# Patient Record
Sex: Male | Born: 1940
Health system: Southern US, Community
[De-identification: ages and names within clinical notes are randomized; demographics above are authoritative.]

## PROBLEM LIST (undated history)

## (undated) DIAGNOSIS — Z9889 Other specified postprocedural states: Secondary | ICD-10-CM

## (undated) DIAGNOSIS — D72829 Elevated white blood cell count, unspecified: Secondary | ICD-10-CM

## (undated) DIAGNOSIS — Z974 Presence of external hearing-aid: Secondary | ICD-10-CM

## (undated) DIAGNOSIS — C439 Malignant melanoma of skin, unspecified: Secondary | ICD-10-CM

## (undated) DIAGNOSIS — G4733 Obstructive sleep apnea (adult) (pediatric): Secondary | ICD-10-CM

## (undated) DIAGNOSIS — I10 Essential (primary) hypertension: Secondary | ICD-10-CM

## (undated) DIAGNOSIS — R7303 Prediabetes: Secondary | ICD-10-CM

## (undated) DIAGNOSIS — K219 Gastro-esophageal reflux disease without esophagitis: Secondary | ICD-10-CM

## (undated) DIAGNOSIS — K5190 Ulcerative colitis, unspecified, without complications: Secondary | ICD-10-CM

## (undated) DIAGNOSIS — E213 Hyperparathyroidism, unspecified: Secondary | ICD-10-CM

## (undated) DIAGNOSIS — Z8701 Personal history of pneumonia (recurrent): Secondary | ICD-10-CM

## (undated) DIAGNOSIS — K6389 Other specified diseases of intestine: Secondary | ICD-10-CM

## (undated) DIAGNOSIS — Z87440 Personal history of urinary (tract) infections: Secondary | ICD-10-CM

## (undated) DIAGNOSIS — J45909 Unspecified asthma, uncomplicated: Secondary | ICD-10-CM

## (undated) DIAGNOSIS — B370 Candidal stomatitis: Secondary | ICD-10-CM

## (undated) DIAGNOSIS — I509 Heart failure, unspecified: Secondary | ICD-10-CM

## (undated) DIAGNOSIS — M459 Ankylosing spondylitis of unspecified sites in spine: Secondary | ICD-10-CM

## (undated) DIAGNOSIS — N200 Calculus of kidney: Secondary | ICD-10-CM

## (undated) DIAGNOSIS — N189 Chronic kidney disease, unspecified: Secondary | ICD-10-CM

## (undated) DIAGNOSIS — A0472 Enterocolitis due to Clostridium difficile, not specified as recurrent: Secondary | ICD-10-CM

## (undated) DIAGNOSIS — K5790 Diverticulosis of intestine, part unspecified, without perforation or abscess without bleeding: Secondary | ICD-10-CM

## (undated) DIAGNOSIS — Z872 Personal history of diseases of the skin and subcutaneous tissue: Secondary | ICD-10-CM

## (undated) DIAGNOSIS — M8580 Other specified disorders of bone density and structure, unspecified site: Secondary | ICD-10-CM

## (undated) DIAGNOSIS — E559 Vitamin D deficiency, unspecified: Secondary | ICD-10-CM

## (undated) DIAGNOSIS — N400 Enlarged prostate without lower urinary tract symptoms: Secondary | ICD-10-CM

## (undated) DIAGNOSIS — Z87442 Personal history of urinary calculi: Secondary | ICD-10-CM

## (undated) DIAGNOSIS — I2510 Atherosclerotic heart disease of native coronary artery without angina pectoris: Secondary | ICD-10-CM

## (undated) DIAGNOSIS — Z8719 Personal history of other diseases of the digestive system: Secondary | ICD-10-CM

## (undated) DIAGNOSIS — E785 Hyperlipidemia, unspecified: Secondary | ICD-10-CM

## (undated) DIAGNOSIS — F329 Major depressive disorder, single episode, unspecified: Secondary | ICD-10-CM

## (undated) DIAGNOSIS — M1990 Unspecified osteoarthritis, unspecified site: Secondary | ICD-10-CM

## (undated) DIAGNOSIS — K529 Noninfective gastroenteritis and colitis, unspecified: Secondary | ICD-10-CM

## (undated) DIAGNOSIS — K449 Diaphragmatic hernia without obstruction or gangrene: Secondary | ICD-10-CM

## (undated) DIAGNOSIS — F32A Depression, unspecified: Secondary | ICD-10-CM

## (undated) DIAGNOSIS — D509 Iron deficiency anemia, unspecified: Secondary | ICD-10-CM

## (undated) DIAGNOSIS — N529 Male erectile dysfunction, unspecified: Secondary | ICD-10-CM

## (undated) DIAGNOSIS — J9611 Chronic respiratory failure with hypoxia: Secondary | ICD-10-CM

## (undated) DIAGNOSIS — J449 Chronic obstructive pulmonary disease, unspecified: Secondary | ICD-10-CM

## (undated) HISTORY — DX: Benign prostatic hyperplasia without lower urinary tract symptoms: N40.0

## (undated) HISTORY — PX: CATARACT EXTRACTION, BILATERAL: SHX1313

## (undated) HISTORY — PX: SKIN CANCER EXCISION: SHX779

## (undated) HISTORY — DX: Enterocolitis due to Clostridium difficile, not specified as recurrent: A04.72

## (undated) HISTORY — DX: Gastro-esophageal reflux disease without esophagitis: K21.9

## (undated) HISTORY — PX: FOOT SURGERY: SHX648

## (undated) HISTORY — DX: Obstructive sleep apnea (adult) (pediatric): G47.33

## (undated) HISTORY — DX: Morbid (severe) obesity due to excess calories: E66.01

## (undated) HISTORY — PX: SHOULDER SURGERY: SHX246

## (undated) HISTORY — PX: TONSILLECTOMY: SUR1361

## (undated) HISTORY — DX: Unspecified osteoarthritis, unspecified site: M19.90

## (undated) HISTORY — DX: Calculus of kidney: N20.0

## (undated) HISTORY — DX: Diverticulosis of intestine, part unspecified, without perforation or abscess without bleeding: K57.90

## (undated) HISTORY — DX: Hyperlipidemia, unspecified: E78.5

## (undated) HISTORY — DX: Other specified postprocedural states: Z98.890

## (undated) HISTORY — PX: CERVICAL SPINE SURGERY: SHX589

## (undated) HISTORY — DX: Noninfective gastroenteritis and colitis, unspecified: K52.9

## (undated) HISTORY — PX: KNEE SURGERY: SHX244

## (undated) HISTORY — DX: Atherosclerotic heart disease of native coronary artery without angina pectoris: I25.10

## (undated) HISTORY — DX: Other specified diseases of intestine: K63.89

## (undated) HISTORY — DX: Heart failure, unspecified: I50.9

## (undated) HISTORY — PX: CARDIAC CATHETERIZATION: SHX172

## (undated) HISTORY — DX: Chronic obstructive pulmonary disease, unspecified: J44.9

---
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
## 1995-01-06 DIAGNOSIS — I2510 Atherosclerotic heart disease of native coronary artery without angina pectoris: Secondary | ICD-10-CM

## 1995-01-06 HISTORY — DX: Atherosclerotic heart disease of native coronary artery without angina pectoris: I25.10

## 1995-01-06 HISTORY — PX: CORONARY STENT PLACEMENT: SHX1402

## 1995-02-06 DIAGNOSIS — IMO0001 Reserved for inherently not codable concepts without codable children: Secondary | ICD-10-CM

## 1995-02-06 HISTORY — DX: Reserved for inherently not codable concepts without codable children: IMO0001

## 1997-08-16 ENCOUNTER — Ambulatory Visit: Admission: RE | Admit: 1997-08-16 | Discharge: 1997-08-16 | Payer: Self-pay | Admitting: Family Medicine

## 1997-11-21 ENCOUNTER — Ambulatory Visit: Admission: RE | Admit: 1997-11-21 | Discharge: 1997-11-21 | Payer: Self-pay | Admitting: Family Medicine

## 2000-04-25 ENCOUNTER — Encounter: Payer: Self-pay | Admitting: Specialist

## 2000-05-02 ENCOUNTER — Observation Stay (HOSPITAL_COMMUNITY): Admission: RE | Admit: 2000-05-02 | Discharge: 2000-05-03 | Payer: Self-pay | Admitting: Specialist

## 2000-11-06 ENCOUNTER — Encounter: Payer: Self-pay | Admitting: Specialist

## 2000-11-13 ENCOUNTER — Inpatient Hospital Stay (HOSPITAL_COMMUNITY): Admission: RE | Admit: 2000-11-13 | Discharge: 2000-11-18 | Payer: Self-pay | Admitting: Specialist

## 2000-11-13 ENCOUNTER — Encounter: Payer: Self-pay | Admitting: Specialist

## 2000-11-15 ENCOUNTER — Encounter: Payer: Self-pay | Admitting: Specialist

## 2000-12-28 ENCOUNTER — Emergency Department (HOSPITAL_COMMUNITY): Admission: EM | Admit: 2000-12-28 | Discharge: 2000-12-28 | Payer: Self-pay | Admitting: Emergency Medicine

## 2000-12-31 ENCOUNTER — Ambulatory Visit (HOSPITAL_COMMUNITY): Admission: RE | Admit: 2000-12-31 | Discharge: 2000-12-31 | Payer: Self-pay | Admitting: Family Medicine

## 2000-12-31 ENCOUNTER — Encounter: Payer: Self-pay | Admitting: Family Medicine

## 2002-07-28 ENCOUNTER — Encounter: Payer: Self-pay | Admitting: Emergency Medicine

## 2002-07-28 ENCOUNTER — Emergency Department (HOSPITAL_COMMUNITY): Admission: EM | Admit: 2002-07-28 | Discharge: 2002-07-28 | Payer: Self-pay | Admitting: Emergency Medicine

## 2002-08-03 ENCOUNTER — Emergency Department (HOSPITAL_COMMUNITY): Admission: EM | Admit: 2002-08-03 | Discharge: 2002-08-03 | Payer: Self-pay | Admitting: Emergency Medicine

## 2003-05-27 ENCOUNTER — Other Ambulatory Visit: Admission: RE | Admit: 2003-05-27 | Discharge: 2003-05-27 | Payer: Self-pay | Admitting: Dermatology

## 2004-09-18 ENCOUNTER — Ambulatory Visit (HOSPITAL_COMMUNITY): Admission: RE | Admit: 2004-09-18 | Discharge: 2004-09-18 | Payer: Self-pay | Admitting: Family Medicine

## 2004-11-30 ENCOUNTER — Ambulatory Visit: Payer: Self-pay | Admitting: Orthopedic Surgery

## 2004-12-14 ENCOUNTER — Ambulatory Visit: Payer: Self-pay | Admitting: Orthopedic Surgery

## 2005-03-29 ENCOUNTER — Inpatient Hospital Stay (HOSPITAL_COMMUNITY): Admission: EM | Admit: 2005-03-29 | Discharge: 2005-03-30 | Payer: Self-pay | Admitting: Emergency Medicine

## 2005-04-05 DIAGNOSIS — A0472 Enterocolitis due to Clostridium difficile, not specified as recurrent: Secondary | ICD-10-CM

## 2005-04-05 HISTORY — PX: COLONOSCOPY: SHX174

## 2005-04-05 HISTORY — DX: Enterocolitis due to Clostridium difficile, not specified as recurrent: A04.72

## 2005-05-02 ENCOUNTER — Encounter (HOSPITAL_COMMUNITY): Admission: RE | Admit: 2005-05-02 | Discharge: 2005-06-01 | Payer: Self-pay | Admitting: *Deleted

## 2005-05-03 ENCOUNTER — Ambulatory Visit: Payer: Self-pay | Admitting: Orthopedic Surgery

## 2005-05-15 ENCOUNTER — Ambulatory Visit: Payer: Self-pay | Admitting: Internal Medicine

## 2005-05-23 ENCOUNTER — Encounter (INDEPENDENT_AMBULATORY_CARE_PROVIDER_SITE_OTHER): Payer: Self-pay | Admitting: Specialist

## 2005-05-23 ENCOUNTER — Ambulatory Visit: Payer: Self-pay | Admitting: Internal Medicine

## 2005-05-23 ENCOUNTER — Ambulatory Visit (HOSPITAL_COMMUNITY): Admission: RE | Admit: 2005-05-23 | Discharge: 2005-05-23 | Payer: Self-pay | Admitting: Internal Medicine

## 2005-06-12 ENCOUNTER — Ambulatory Visit: Payer: Self-pay | Admitting: Internal Medicine

## 2005-06-27 ENCOUNTER — Ambulatory Visit: Payer: Self-pay | Admitting: Internal Medicine

## 2005-07-03 ENCOUNTER — Ambulatory Visit: Payer: Self-pay | Admitting: Internal Medicine

## 2006-05-15 ENCOUNTER — Inpatient Hospital Stay (HOSPITAL_COMMUNITY): Admission: RE | Admit: 2006-05-15 | Discharge: 2006-05-20 | Payer: Self-pay | Admitting: Specialist

## 2008-05-14 ENCOUNTER — Encounter: Payer: Self-pay | Admitting: Internal Medicine

## 2008-05-26 ENCOUNTER — Ambulatory Visit (HOSPITAL_COMMUNITY): Admission: RE | Admit: 2008-05-26 | Discharge: 2008-05-26 | Payer: Self-pay | Admitting: Family Medicine

## 2008-08-01 ENCOUNTER — Ambulatory Visit: Admission: RE | Admit: 2008-08-01 | Discharge: 2008-08-01 | Payer: Self-pay | Admitting: Emergency Medicine

## 2008-11-11 ENCOUNTER — Emergency Department (HOSPITAL_COMMUNITY): Admission: EM | Admit: 2008-11-11 | Discharge: 2008-11-11 | Payer: Self-pay | Admitting: Emergency Medicine

## 2008-11-26 ENCOUNTER — Ambulatory Visit (HOSPITAL_COMMUNITY): Admission: RE | Admit: 2008-11-26 | Discharge: 2008-11-26 | Payer: Self-pay | Admitting: Family Medicine

## 2009-02-05 DIAGNOSIS — K529 Noninfective gastroenteritis and colitis, unspecified: Secondary | ICD-10-CM

## 2009-02-05 HISTORY — DX: Noninfective gastroenteritis and colitis, unspecified: K52.9

## 2009-03-22 ENCOUNTER — Inpatient Hospital Stay (HOSPITAL_COMMUNITY): Admission: EM | Admit: 2009-03-22 | Discharge: 2009-03-25 | Payer: Self-pay | Admitting: Emergency Medicine

## 2009-03-23 ENCOUNTER — Encounter (INDEPENDENT_AMBULATORY_CARE_PROVIDER_SITE_OTHER): Payer: Self-pay | Admitting: Internal Medicine

## 2009-03-23 HISTORY — PX: TRANSTHORACIC ECHOCARDIOGRAM: SHX275

## 2009-04-22 ENCOUNTER — Encounter: Admission: RE | Admit: 2009-04-22 | Discharge: 2009-04-22 | Payer: Self-pay | Admitting: Neurosurgery

## 2009-05-26 ENCOUNTER — Ambulatory Visit (HOSPITAL_COMMUNITY): Admission: RE | Admit: 2009-05-26 | Discharge: 2009-05-26 | Payer: Self-pay | Admitting: Family Medicine

## 2009-06-07 ENCOUNTER — Ambulatory Visit (HOSPITAL_COMMUNITY): Admission: RE | Admit: 2009-06-07 | Discharge: 2009-06-07 | Payer: Self-pay | Admitting: Neurosurgery

## 2009-07-21 HISTORY — PX: CARDIOVASCULAR STRESS TEST: SHX262

## 2009-08-05 ENCOUNTER — Emergency Department (HOSPITAL_COMMUNITY): Admission: EM | Admit: 2009-08-05 | Discharge: 2009-08-06 | Payer: Self-pay | Admitting: Emergency Medicine

## 2009-08-19 ENCOUNTER — Inpatient Hospital Stay (HOSPITAL_COMMUNITY): Admission: RE | Admit: 2009-08-19 | Discharge: 2009-08-21 | Payer: Self-pay | Admitting: Neurosurgery

## 2009-10-19 ENCOUNTER — Emergency Department (HOSPITAL_COMMUNITY): Admission: EM | Admit: 2009-10-19 | Discharge: 2009-10-19 | Payer: Self-pay | Admitting: Emergency Medicine

## 2009-11-21 ENCOUNTER — Ambulatory Visit (HOSPITAL_COMMUNITY): Admission: RE | Admit: 2009-11-21 | Discharge: 2009-11-21 | Payer: Self-pay | Admitting: Family Medicine

## 2010-03-07 NOTE — Letter (Signed)
Summary: REQUEST LETTER/DDS  REQUEST LETTER/DDS   Imported By: Zeb Comfort 05/14/2008 14:08:20  _____________________________________________________________________  External Attachment:    Type:   Image     Comment:   External Document

## 2010-04-11 ENCOUNTER — Encounter (HOSPITAL_COMMUNITY): Payer: Self-pay

## 2010-04-11 ENCOUNTER — Other Ambulatory Visit (HOSPITAL_COMMUNITY): Payer: Self-pay | Admitting: Family Medicine

## 2010-04-11 ENCOUNTER — Ambulatory Visit (HOSPITAL_COMMUNITY)
Admission: RE | Admit: 2010-04-11 | Discharge: 2010-04-11 | Disposition: A | Discharge status: Home / Self Care | Payer: Medicare Other | Source: Ambulatory Visit | Attending: Family Medicine | Admitting: Family Medicine

## 2010-04-11 DIAGNOSIS — R0602 Shortness of breath: Secondary | ICD-10-CM

## 2010-04-11 DIAGNOSIS — R079 Chest pain, unspecified: Secondary | ICD-10-CM | POA: Insufficient documentation

## 2010-04-11 DIAGNOSIS — R1012 Left upper quadrant pain: Secondary | ICD-10-CM | POA: Insufficient documentation

## 2010-04-11 HISTORY — DX: Essential (primary) hypertension: I10

## 2010-04-12 ENCOUNTER — Other Ambulatory Visit (HOSPITAL_COMMUNITY): Payer: Self-pay | Admitting: Family Medicine

## 2010-04-12 ENCOUNTER — Encounter (HOSPITAL_COMMUNITY): Payer: Self-pay

## 2010-04-12 ENCOUNTER — Ambulatory Visit (HOSPITAL_COMMUNITY)
Admission: RE | Admit: 2010-04-12 | Discharge: 2010-04-12 | Disposition: A | Discharge status: Home / Self Care | Payer: Medicare Other | Source: Ambulatory Visit | Attending: Family Medicine | Admitting: Family Medicine

## 2010-04-12 ENCOUNTER — Inpatient Hospital Stay (HOSPITAL_COMMUNITY)
Admission: RE | Admit: 2010-04-12 | Discharge: 2010-04-16 | DRG: 192 | Disposition: A | Discharge status: Home / Self Care | Payer: Medicare Other | Source: Ambulatory Visit | Attending: Internal Medicine | Admitting: Internal Medicine

## 2010-04-12 DIAGNOSIS — G4733 Obstructive sleep apnea (adult) (pediatric): Secondary | ICD-10-CM | POA: Diagnosis present

## 2010-04-12 DIAGNOSIS — R7989 Other specified abnormal findings of blood chemistry: Secondary | ICD-10-CM | POA: Insufficient documentation

## 2010-04-12 DIAGNOSIS — I10 Essential (primary) hypertension: Secondary | ICD-10-CM | POA: Insufficient documentation

## 2010-04-12 DIAGNOSIS — J449 Chronic obstructive pulmonary disease, unspecified: Principal | ICD-10-CM | POA: Diagnosis present

## 2010-04-12 DIAGNOSIS — R0602 Shortness of breath: Secondary | ICD-10-CM

## 2010-04-12 DIAGNOSIS — F17200 Nicotine dependence, unspecified, uncomplicated: Secondary | ICD-10-CM | POA: Insufficient documentation

## 2010-04-12 DIAGNOSIS — M1990 Unspecified osteoarthritis, unspecified site: Secondary | ICD-10-CM | POA: Diagnosis present

## 2010-04-12 DIAGNOSIS — R195 Other fecal abnormalities: Secondary | ICD-10-CM | POA: Diagnosis present

## 2010-04-12 DIAGNOSIS — R0609 Other forms of dyspnea: Secondary | ICD-10-CM | POA: Diagnosis present

## 2010-04-12 DIAGNOSIS — J4489 Other specified chronic obstructive pulmonary disease: Principal | ICD-10-CM | POA: Diagnosis present

## 2010-04-12 DIAGNOSIS — R911 Solitary pulmonary nodule: Secondary | ICD-10-CM | POA: Diagnosis present

## 2010-04-12 DIAGNOSIS — K219 Gastro-esophageal reflux disease without esophagitis: Secondary | ICD-10-CM | POA: Diagnosis present

## 2010-04-12 DIAGNOSIS — E876 Hypokalemia: Secondary | ICD-10-CM | POA: Diagnosis not present

## 2010-04-12 DIAGNOSIS — N400 Enlarged prostate without lower urinary tract symptoms: Secondary | ICD-10-CM | POA: Diagnosis present

## 2010-04-12 DIAGNOSIS — R0989 Other specified symptoms and signs involving the circulatory and respiratory systems: Secondary | ICD-10-CM | POA: Diagnosis present

## 2010-04-12 DIAGNOSIS — E785 Hyperlipidemia, unspecified: Secondary | ICD-10-CM | POA: Diagnosis present

## 2010-04-12 DIAGNOSIS — K259 Gastric ulcer, unspecified as acute or chronic, without hemorrhage or perforation: Secondary | ICD-10-CM | POA: Diagnosis present

## 2010-04-12 DIAGNOSIS — Z791 Long term (current) use of non-steroidal anti-inflammatories (NSAID): Secondary | ICD-10-CM

## 2010-04-12 LAB — COMPREHENSIVE METABOLIC PANEL
ALT: 20 U/L (ref 0–53)
AST: 19 U/L (ref 0–37)
Albumin: 3.5 g/dL (ref 3.5–5.2)
Alkaline Phosphatase: 78 U/L (ref 39–117)
BUN: 12 mg/dL (ref 6–23)
CO2: 24 mEq/L (ref 19–32)
Calcium: 8.9 mg/dL (ref 8.4–10.5)
Chloride: 106 mEq/L (ref 96–112)
Creatinine, Ser: 1.03 mg/dL (ref 0.4–1.5)
GFR calc Af Amer: >60 mL/min (ref >60)
GFR calc non Af Amer: >60 mL/min (ref >60)
Glucose, Bld: 95 mg/dL (ref 70–99)
Potassium: 3.8 mEq/L (ref 3.5–5.1)
Sodium: 141 mEq/L (ref 135–145)
Total Bilirubin: 1.1 mg/dL (ref 0.3–1.2)
Total Protein: 6.5 g/dL (ref 6.0–8.3)

## 2010-04-12 LAB — D-DIMER, QUANTITATIVE (NOT AT ARMC): D-Dimer, Quant: 1.93 ug/mL-FEU — ABNORMAL HIGH (ref 0.00–0.48)

## 2010-04-12 LAB — CBC
HCT: 43.9 %
HCT: 42.5 % (ref 39.0–52.0)
Hemoglobin: 14.4 g/dL
Hemoglobin: 14.4 g/dL (ref 13.0–17.0)
MCH: 29.6 pg
MCH: 29.9 pg (ref 26.0–34.0)
MCHC: 32.8 g/dL
MCHC: 33.9 g/dL (ref 30.0–36.0)
MCV: 90.3 fL
MCV: 88.4 fL (ref 78.0–100.0)
Platelets: 229 10*3/uL
Platelets: 211 10*3/uL (ref 150–400)
RBC: 4.86 MIL/uL
RBC: 4.81 MIL/uL (ref 4.22–5.81)
RDW: 13.2 %
RDW: 13.1 % (ref 11.5–15.5)
WBC: 9.9 10*3/uL
WBC: 10.6 10*3/uL — ABNORMAL HIGH (ref 4.0–10.5)

## 2010-04-12 LAB — BASIC METABOLIC PANEL
BUN: 11 mg/dL
BUN: 12 mg/dL (ref 6–23)
CO2: 23 mEq/L
CO2: 26 mEq/L (ref 19–32)
Calcium: 8.8 mg/dL
Calcium: 8.9 mg/dL (ref 8.4–10.5)
Chloride: 105 mEq/L
Chloride: 104 mEq/L (ref 96–112)
Creatinine, Ser: 1.02 mg/dL
Creatinine, Ser: 1.03 mg/dL (ref 0.4–1.5)
GFR calc Af Amer: >60 mL/min
GFR calc Af Amer: >60 mL/min (ref >60)
GFR calc non Af Amer: >60 mL/min
GFR calc non Af Amer: >60 mL/min (ref >60)
Glucose, Bld: 98 mg/dL
Glucose, Bld: 92 mg/dL (ref 70–99)
Potassium: 3.8 mEq/L
Potassium: 3.5 mEq/L (ref 3.5–5.1)
Sodium: 139 mEq/L
Sodium: 137 mEq/L (ref 135–145)

## 2010-04-12 LAB — DIFFERENTIAL
Basophils Absolute: 0.1 10*3/uL
Basophils Relative: 1 %
Eosinophils Absolute: 0.3 10*3/uL
Eosinophils Relative: 3 %
Lymphocytes Relative: 31 %
Lymphs Abs: 3 10*3/uL
Monocytes Absolute: 1.2 10*3/uL
Monocytes Relative: 12 %
Neutro Abs: 5.3 10*3/uL
Neutrophils Relative %: 54 %

## 2010-04-12 LAB — BILIRUBIN, DIRECT: Bilirubin, Direct: 0.2 mg/dL (ref 0.0–0.3)

## 2010-04-12 MED ORDER — XENON XE 133 GAS
10.0000 | GAS_FOR_INHALATION | Freq: Once | RESPIRATORY_TRACT | Status: AC | PRN
  Administered 2010-04-12: 19.8 via RESPIRATORY_TRACT

## 2010-04-12 MED ORDER — TECHNETIUM TO 99M ALBUMIN AGGREGATED
6.0000 | Freq: Once | INTRAVENOUS | Status: AC | PRN
  Administered 2010-04-12: 5.5 via INTRAVENOUS

## 2010-04-12 MED ORDER — IOHEXOL 350 MG/ML SOLN: 100.0000 mL | Freq: Once | INTRAVENOUS | Status: DC | PRN

## 2010-04-13 ENCOUNTER — Inpatient Hospital Stay (HOSPITAL_COMMUNITY): Payer: Medicare Other

## 2010-04-13 DIAGNOSIS — I517 Cardiomegaly: Secondary | ICD-10-CM

## 2010-04-13 DIAGNOSIS — R109 Unspecified abdominal pain: Secondary | ICD-10-CM

## 2010-04-13 DIAGNOSIS — D649 Anemia, unspecified: Secondary | ICD-10-CM

## 2010-04-13 LAB — TSH: TSH: 1.655 u[IU]/mL (ref 0.350–4.500)

## 2010-04-13 LAB — CARDIAC PANEL(CRET KIN+CKTOT+MB+TROPI)
CK, MB: 0.8 ng/mL (ref 0.3–4.0)
CK, MB: 0.9 ng/mL (ref 0.3–4.0)
Relative Index: 0.8 (ref 0.0–2.5)
Relative Index: INVALID (ref 0.0–2.5)
Total CK: 104 U/L (ref 7–232)
Total CK: 94 U/L (ref 7–232)
Troponin I: 0.01 ng/mL (ref 0.00–0.06)
Troponin I: 0.02 ng/mL (ref 0.00–0.06)

## 2010-04-13 LAB — LIPASE, BLOOD: Lipase: 28 U/L (ref 11–59)

## 2010-04-13 LAB — BRAIN NATRIURETIC PEPTIDE: Pro B Natriuretic peptide (BNP): <30 pg/mL (ref 0.0–100.0)

## 2010-04-13 LAB — HEPARIN LEVEL (UNFRACTIONATED): Heparin Unfractionated: 0.62 IU/mL (ref 0.30–0.70)

## 2010-04-13 LAB — HEMOCCULT GUIAC POC 1CARD (OFFICE): Fecal Occult Bld: POSITIVE

## 2010-04-14 ENCOUNTER — Inpatient Hospital Stay (HOSPITAL_COMMUNITY): Payer: Medicare Other

## 2010-04-14 DIAGNOSIS — K259 Gastric ulcer, unspecified as acute or chronic, without hemorrhage or perforation: Secondary | ICD-10-CM

## 2010-04-14 DIAGNOSIS — K921 Melena: Secondary | ICD-10-CM

## 2010-04-14 LAB — BASIC METABOLIC PANEL
BUN: 11 mg/dL (ref 6–23)
CO2: 29 mEq/L (ref 19–32)
Calcium: 8.6 mg/dL (ref 8.4–10.5)
Chloride: 106 mEq/L (ref 96–112)
Creatinine, Ser: 1.11 mg/dL (ref 0.4–1.5)
GFR calc Af Amer: >60 mL/min (ref >60)
GFR calc non Af Amer: >60 mL/min (ref >60)
Glucose, Bld: 99 mg/dL (ref 70–99)
Potassium: 3.5 mEq/L (ref 3.5–5.1)
Sodium: 143 mEq/L (ref 135–145)

## 2010-04-14 LAB — HEMOGLOBIN AND HEMATOCRIT, BLOOD
HCT: 39 % (ref 39.0–52.0)
Hemoglobin: 13.1 g/dL (ref 13.0–17.0)

## 2010-04-14 LAB — CLOSTRIDIUM DIFFICILE BY PCR: Toxigenic C. Difficile by PCR: NEGATIVE

## 2010-04-14 LAB — FECAL LACTOFERRIN, QUANT: Fecal Lactoferrin: POSITIVE

## 2010-04-14 MED ORDER — IOHEXOL 350 MG/ML SOLN
150.0000 mL | Freq: Once | INTRAVENOUS | Status: AC | PRN
  Administered 2010-04-14: 150 mL via INTRAVENOUS

## 2010-04-16 LAB — CBC
HCT: 38.7 % — ABNORMAL LOW (ref 39.0–52.0)
Hemoglobin: 12.9 g/dL — ABNORMAL LOW (ref 13.0–17.0)
MCH: 29.6 pg (ref 26.0–34.0)
MCHC: 33.3 g/dL (ref 30.0–36.0)
MCV: 88.8 fL (ref 78.0–100.0)
Platelets: 219 10*3/uL (ref 150–400)
RBC: 4.36 MIL/uL (ref 4.22–5.81)
RDW: 12.7 % (ref 11.5–15.5)
WBC: 8.4 10*3/uL (ref 4.0–10.5)

## 2010-04-16 LAB — DIFFERENTIAL
Basophils Absolute: 0.1 10*3/uL (ref 0.0–0.1)
Basophils Relative: 1 % (ref 0–1)
Eosinophils Absolute: 0.5 10*3/uL (ref 0.0–0.7)
Eosinophils Relative: 5 % (ref 0–5)
Lymphocytes Relative: 28 % (ref 12–46)
Lymphs Abs: 2.3 10*3/uL (ref 0.7–4.0)
Monocytes Absolute: 1 10*3/uL (ref 0.1–1.0)
Monocytes Relative: 11 % (ref 3–12)
Neutro Abs: 4.6 10*3/uL (ref 1.7–7.7)
Neutrophils Relative %: 55 % (ref 43–77)

## 2010-04-16 LAB — BASIC METABOLIC PANEL
BUN: 13 mg/dL (ref 6–23)
CO2: 28 mEq/L (ref 19–32)
Calcium: 8.2 mg/dL — ABNORMAL LOW (ref 8.4–10.5)
Chloride: 103 mEq/L (ref 96–112)
Creatinine, Ser: 1.11 mg/dL (ref 0.4–1.5)
GFR calc Af Amer: >60 mL/min (ref >60)
GFR calc non Af Amer: >60 mL/min (ref >60)
Glucose, Bld: 104 mg/dL — ABNORMAL HIGH (ref 70–99)
Potassium: 3.2 mEq/L — ABNORMAL LOW (ref 3.5–5.1)
Sodium: 139 mEq/L (ref 135–145)

## 2010-04-17 LAB — HSV(HERPES SIMPLEX VRS) I + II AB-IGG: Herpes Simplex Vrs I + II Ab, IgG: 39.7 IV — ABNORMAL HIGH

## 2010-04-17 LAB — H. PYLORI ANTIBODY, IGG: H Pylori IgG: <0.4 {ISR}

## 2010-04-20 LAB — CBC
HCT: 41.3 % (ref 39.0–52.0)
Hemoglobin: 14 g/dL (ref 13.0–17.0)
MCH: 30.5 pg (ref 26.0–34.0)
MCHC: 34 g/dL (ref 30.0–36.0)
MCV: 89.9 fL (ref 78.0–100.0)
Platelets: 193 10*3/uL (ref 150–400)
RBC: 4.6 MIL/uL (ref 4.22–5.81)
RDW: 13.5 % (ref 11.5–15.5)
WBC: 6.4 10*3/uL (ref 4.0–10.5)

## 2010-04-20 LAB — PROTIME-INR
INR: 1 (ref 0.00–1.49)
Prothrombin Time: 13.4 seconds (ref 11.6–15.2)

## 2010-04-20 LAB — DIFFERENTIAL
Basophils Absolute: 0 10*3/uL (ref 0.0–0.1)
Basophils Relative: 1 % (ref 0–1)
Eosinophils Absolute: 0.2 10*3/uL (ref 0.0–0.7)
Eosinophils Relative: 3 % (ref 0–5)
Lymphocytes Relative: 25 % (ref 12–46)
Lymphs Abs: 1.6 10*3/uL (ref 0.7–4.0)
Monocytes Absolute: 0.4 10*3/uL (ref 0.1–1.0)
Monocytes Relative: 6 % (ref 3–12)
Neutro Abs: 4.2 10*3/uL (ref 1.7–7.7)
Neutrophils Relative %: 65 % (ref 43–77)

## 2010-04-23 LAB — URINALYSIS, ROUTINE W REFLEX MICROSCOPIC
Bilirubin Urine: NEGATIVE
Bilirubin Urine: NEGATIVE
Glucose, UA: NEGATIVE mg/dL
Glucose, UA: NEGATIVE mg/dL
Hgb urine dipstick: NEGATIVE
Hgb urine dipstick: NEGATIVE
Ketones, ur: NEGATIVE mg/dL
Ketones, ur: NEGATIVE mg/dL
Nitrite: NEGATIVE
Nitrite: NEGATIVE
Protein, ur: NEGATIVE mg/dL
Protein, ur: NEGATIVE mg/dL
Specific Gravity, Urine: 1.018 (ref 1.005–1.030)
Specific Gravity, Urine: 1.019 (ref 1.005–1.030)
Urobilinogen, UA: 0.2 mg/dL (ref 0.0–1.0)
Urobilinogen, UA: 0.2 mg/dL (ref 0.0–1.0)
pH: 5 (ref 5.0–8.0)
pH: 5.5 (ref 5.0–8.0)

## 2010-04-23 LAB — DIFFERENTIAL
Basophils Absolute: 0.1 10*3/uL (ref 0.0–0.1)
Basophils Absolute: 0.1 10*3/uL (ref 0.0–0.1)
Basophils Relative: 1 % (ref 0–1)
Basophils Relative: 1 % (ref 0–1)
Eosinophils Absolute: 0.5 10*3/uL (ref 0.0–0.7)
Eosinophils Absolute: 0.4 10*3/uL (ref 0.0–0.7)
Eosinophils Relative: 6 % — ABNORMAL HIGH (ref 0–5)
Eosinophils Relative: 5 % (ref 0–5)
Lymphocytes Relative: 41 % (ref 12–46)
Lymphocytes Relative: 40 % (ref 12–46)
Lymphs Abs: 3.2 10*3/uL (ref 0.7–4.0)
Lymphs Abs: 3.4 10*3/uL (ref 0.7–4.0)
Monocytes Absolute: 0.7 10*3/uL (ref 0.1–1.0)
Monocytes Absolute: 0.7 10*3/uL (ref 0.1–1.0)
Monocytes Relative: 9 % (ref 3–12)
Monocytes Relative: 8 % (ref 3–12)
Neutro Abs: 3.4 10*3/uL (ref 1.7–7.7)
Neutro Abs: 4 10*3/uL (ref 1.7–7.7)
Neutrophils Relative %: 43 % (ref 43–77)
Neutrophils Relative %: 47 % (ref 43–77)

## 2010-04-23 LAB — CBC
HCT: 43.6 % (ref 39.0–52.0)
HCT: 45.6 % (ref 39.0–52.0)
Hemoglobin: 15.2 g/dL (ref 13.0–17.0)
Hemoglobin: 15.4 g/dL (ref 13.0–17.0)
MCH: 31.1 pg (ref 26.0–34.0)
MCH: 30.6 pg (ref 26.0–34.0)
MCHC: 35 g/dL (ref 30.0–36.0)
MCHC: 33.8 g/dL (ref 30.0–36.0)
MCV: 89 fL (ref 78.0–100.0)
MCV: 90.5 fL (ref 78.0–100.0)
Platelets: 215 10*3/uL (ref 150–400)
Platelets: 194 10*3/uL (ref 150–400)
RBC: 4.89 MIL/uL (ref 4.22–5.81)
RBC: 5.04 MIL/uL (ref 4.22–5.81)
RDW: 13.3 % (ref 11.5–15.5)
RDW: 13.3 % (ref 11.5–15.5)
WBC: 7.8 10*3/uL (ref 4.0–10.5)
WBC: 8.6 10*3/uL (ref 4.0–10.5)

## 2010-04-23 LAB — COMPREHENSIVE METABOLIC PANEL
ALT: 22 U/L (ref 0–53)
ALT: 23 U/L (ref 0–53)
AST: 25 U/L (ref 0–37)
AST: 24 U/L (ref 0–37)
Albumin: 3.7 g/dL (ref 3.5–5.2)
Albumin: 3.8 g/dL (ref 3.5–5.2)
Alkaline Phosphatase: 92 U/L (ref 39–117)
Alkaline Phosphatase: 89 U/L (ref 39–117)
BUN: 16 mg/dL (ref 6–23)
BUN: 9 mg/dL (ref 6–23)
CO2: 26 mEq/L (ref 19–32)
CO2: 28 mEq/L (ref 19–32)
Calcium: 9 mg/dL (ref 8.4–10.5)
Calcium: 9 mg/dL (ref 8.4–10.5)
Chloride: 109 mEq/L (ref 96–112)
Chloride: 108 mEq/L (ref 96–112)
Creatinine, Ser: 1.13 mg/dL (ref 0.4–1.5)
Creatinine, Ser: 0.91 mg/dL (ref 0.4–1.5)
GFR calc Af Amer: >60 mL/min (ref >60)
GFR calc Af Amer: >60 mL/min (ref >60)
GFR calc non Af Amer: >60 mL/min (ref >60)
GFR calc non Af Amer: >60 mL/min (ref >60)
Glucose, Bld: 112 mg/dL — ABNORMAL HIGH (ref 70–99)
Glucose, Bld: 98 mg/dL (ref 70–99)
Potassium: 3.9 mEq/L (ref 3.5–5.1)
Potassium: 4.3 mEq/L (ref 3.5–5.1)
Sodium: 142 mEq/L (ref 135–145)
Sodium: 141 mEq/L (ref 135–145)
Total Bilirubin: 0.6 mg/dL (ref 0.3–1.2)
Total Bilirubin: 0.8 mg/dL (ref 0.3–1.2)
Total Protein: 6.9 g/dL (ref 6.0–8.3)
Total Protein: 6.7 g/dL (ref 6.0–8.3)

## 2010-04-23 LAB — SURGICAL PCR SCREEN
MRSA, PCR: NEGATIVE
Staphylococcus aureus: NEGATIVE

## 2010-04-23 LAB — PROTIME-INR
INR: 1.02 (ref 0.00–1.49)
Prothrombin Time: 13.3 seconds (ref 11.6–15.2)

## 2010-04-23 LAB — LIPASE, BLOOD: Lipase: 32 U/L (ref 11–59)

## 2010-04-23 LAB — APTT: aPTT: 32 seconds (ref 24–37)

## 2010-04-23 NOTE — Op Note (Signed)
  NAME:  [name], [name]             ACCOUNT NO.:  [id number]  MEDICAL RECORD NO.:  [id number]           PATIENT TYPE:  LOCATION:                                 FACILITY:  PHYSICIAN:  R. Garfield Cornea, M.D. DATE OF BIRTH:  [birth year]  DATE OF PROCEDURE:  04/14/2010 DATE OF DISCHARGE:                              OPERATIVE REPORT   PROCEDURE:  Diagnostic EGD.  INDICATIONS FOR PROCEDURE:  A 70 year old gentleman with low-volume hematochezia.  He remains hemodynamically stable with a normal hemoglobin and also has significant dyspepsia and epigastric pain in the setting of excessive daily NSAID use.  He is admitted to the hospital with dyspnea, workup for pulmonary emboli inconclusive at this time. EGD is now being done to sort out his symptoms.  Risks, benefits, limitations, alternatives, and imponderables have been discussed and questions answered.  Please see the documentation in the medical record.  PROCEDURE NOTE:  O2 saturation, blood pressure, pulse, and respiration monitored throughout the entirety of the procedure.  CONSCIOUS SEDATION:  Versed 3 mg IV, Demerol 75 mg IV in divided doses.  INSTRUMENT:  Pentax video chip system.  Cetacaine spray for topical pharyngeal anesthesia.  FINDINGS:  Examination of tubular esophagus revealed no mucosal abnormalities.  EG junction was easily traversed.  Stomach:  Gastric cavity was emptied and insufflated well with air.  Thorough examination of the gastric mucosa including retroflexion at the proximal stomach and esophagogastric junction demonstrated three 5-mm antral ulcers with clean bases.  There was some underlying nodularity in the mucosa, but these appeared to be benign.  Please see photos.  There was no obvious infiltrating process or other abnormality.  Pylorus was patent and easily traversed.  Examination of the bulb and second portion revealed no abnormalities.  THERAPEUTIC/DIAGNOSTIC MANEUVERS PERFORMED:  None.  The  patient tolerated the procedure well, was reactive to endoscopy.  IMPRESSION: 1. Normal EGD. 2. Normal esophagus. 3. Gastric ulcers as described above, otherwise normal stomach, patent     pylorus, normal D1-D2.  RECOMMENDATIONS: 1. Increase Protonix to 40 mg orally twice daily. 2. Stop NSAID use entirely for the time-being. 3. Obtain H. pylori serologies, treat if positive. 4. Advocate a repeat EGD and probable colonoscopy in 12 weeks from     now.  He appears to have trivial rectal bleeding.  His hemoglobin remains normal.  There was a history of left-sided proctocolitis on prior colonoscopy, it is not clear whether or not he has underlying inflammatory bowel disease or this was related to infectious process of previously at any rate, nonsteroidal agent use in access could produce rectal bleeding via mechanism of colitis or could exacerbate a preexisting proctocolitis of other cause.     Bridgette Habermann, M.D.     RMR/MEDQ  D:  04/14/2010  T:  04/14/2010  Job:  [id number]  cc:   Nicki Reaper A. Wolfgang Phoenix, MD Fax: [phone]  [facility]  Electronically Signed by Jannette Spanner M.D. on 04/23/2010 09:11:55 AM

## 2010-04-26 LAB — DIFFERENTIAL
Basophils Absolute: 0 10*3/uL (ref 0.0–0.1)
Basophils Absolute: 0 10*3/uL (ref 0.0–0.1)
Basophils Absolute: 0 10*3/uL (ref 0.0–0.1)
Basophils Absolute: 0 10*3/uL (ref 0.0–0.1)
Basophils Relative: 0 % (ref 0–1)
Basophils Relative: 0 % (ref 0–1)
Basophils Relative: 0 % (ref 0–1)
Basophils Relative: 0 % (ref 0–1)
Eosinophils Absolute: 0 10*3/uL (ref 0.0–0.7)
Eosinophils Absolute: 0 10*3/uL (ref 0.0–0.7)
Eosinophils Absolute: 0 10*3/uL (ref 0.0–0.7)
Eosinophils Absolute: 0 10*3/uL (ref 0.0–0.7)
Eosinophils Relative: 0 % (ref 0–5)
Eosinophils Relative: 0 % (ref 0–5)
Eosinophils Relative: 0 % (ref 0–5)
Eosinophils Relative: 0 % (ref 0–5)
Lymphocytes Relative: 10 % — ABNORMAL LOW (ref 12–46)
Lymphocytes Relative: 6 % — ABNORMAL LOW (ref 12–46)
Lymphocytes Relative: 7 % — ABNORMAL LOW (ref 12–46)
Lymphocytes Relative: 8 % — ABNORMAL LOW (ref 12–46)
Lymphs Abs: 1.3 10*3/uL (ref 0.7–4.0)
Lymphs Abs: 1.4 10*3/uL (ref 0.7–4.0)
Lymphs Abs: 1.5 10*3/uL (ref 0.7–4.0)
Lymphs Abs: 1.7 10*3/uL (ref 0.7–4.0)
Monocytes Absolute: 0.5 10*3/uL (ref 0.1–1.0)
Monocytes Absolute: 0.8 10*3/uL (ref 0.1–1.0)
Monocytes Absolute: 0.9 10*3/uL (ref 0.1–1.0)
Monocytes Absolute: 1 10*3/uL (ref 0.1–1.0)
Monocytes Relative: 3 % (ref 3–12)
Monocytes Relative: 4 % (ref 3–12)
Monocytes Relative: 5 % (ref 3–12)
Monocytes Relative: 5 % (ref 3–12)
Neutro Abs: 14.7 10*3/uL — ABNORMAL HIGH (ref 1.7–7.7)
Neutro Abs: 14.8 10*3/uL — ABNORMAL HIGH (ref 1.7–7.7)
Neutro Abs: 17.9 10*3/uL — ABNORMAL HIGH (ref 1.7–7.7)
Neutro Abs: 23.1 10*3/uL — ABNORMAL HIGH (ref 1.7–7.7)
Neutrophils Relative %: 86 % — ABNORMAL HIGH (ref 43–77)
Neutrophils Relative %: 88 % — ABNORMAL HIGH (ref 43–77)
Neutrophils Relative %: 89 % — ABNORMAL HIGH (ref 43–77)
Neutrophils Relative %: 91 % — ABNORMAL HIGH (ref 43–77)
WBC Morphology: INCREASED
WBC Morphology: INCREASED
WBC Morphology: INCREASED

## 2010-04-26 LAB — BLOOD GAS, ARTERIAL
Acid-Base Excess: 0.5 mmol/L (ref 0.0–2.0)
Acid-Base Excess: 0.7 mmol/L (ref 0.0–2.0)
Bicarbonate: 24.1 mEq/L — ABNORMAL HIGH (ref 20.0–24.0)
Bicarbonate: 25.5 mEq/L — ABNORMAL HIGH (ref 20.0–24.0)
O2 Content: 2 L/min
O2 Content: 2 L/min
O2 Saturation: 90.1 %
O2 Saturation: 91.5 %
Patient temperature: 37
Patient temperature: 37
TCO2: 20.8 mmol/L (ref 0–100)
TCO2: 22.7 mmol/L (ref 0–100)
pCO2 arterial: 35.5 mmHg (ref 35.0–45.0)
pCO2 arterial: 46.3 mmHg — ABNORMAL HIGH (ref 35.0–45.0)
pH, Arterial: 7.36 (ref 7.350–7.450)
pH, Arterial: 7.446 (ref 7.350–7.450)
pO2, Arterial: 58 mmHg — ABNORMAL LOW (ref 80.0–100.0)
pO2, Arterial: 59.5 mmHg — ABNORMAL LOW (ref 80.0–100.0)

## 2010-04-26 LAB — CBC
HCT: 39 % (ref 39.0–52.0)
HCT: 40 % (ref 39.0–52.0)
HCT: 41.8 % (ref 39.0–52.0)
HCT: 42.6 % (ref 39.0–52.0)
Hemoglobin: 13.3 g/dL (ref 13.0–17.0)
Hemoglobin: 13.5 g/dL (ref 13.0–17.0)
Hemoglobin: 14 g/dL (ref 13.0–17.0)
Hemoglobin: 14.7 g/dL (ref 13.0–17.0)
MCHC: 33.5 g/dL (ref 30.0–36.0)
MCHC: 33.8 g/dL (ref 30.0–36.0)
MCHC: 34.1 g/dL (ref 30.0–36.0)
MCHC: 34.5 g/dL (ref 30.0–36.0)
MCV: 88.9 fL (ref 78.0–100.0)
MCV: 90 fL (ref 78.0–100.0)
MCV: 90.6 fL (ref 78.0–100.0)
MCV: 90.9 fL (ref 78.0–100.0)
Platelets: 172 10*3/uL (ref 150–400)
Platelets: 181 10*3/uL (ref 150–400)
Platelets: 187 10*3/uL (ref 150–400)
Platelets: 201 10*3/uL (ref 150–400)
RBC: 4.33 MIL/uL (ref 4.22–5.81)
RBC: 4.41 MIL/uL (ref 4.22–5.81)
RBC: 4.6 MIL/uL (ref 4.22–5.81)
RBC: 4.79 MIL/uL (ref 4.22–5.81)
RDW: 13.1 % (ref 11.5–15.5)
RDW: 13.3 % (ref 11.5–15.5)
RDW: 13.4 % (ref 11.5–15.5)
RDW: 13.6 % (ref 11.5–15.5)
WBC: 16.5 10*3/uL — ABNORMAL HIGH (ref 4.0–10.5)
WBC: 17.4 10*3/uL — ABNORMAL HIGH (ref 4.0–10.5)
WBC: 20.4 10*3/uL — ABNORMAL HIGH (ref 4.0–10.5)
WBC: 25.4 10*3/uL — ABNORMAL HIGH (ref 4.0–10.5)

## 2010-04-26 LAB — GLUCOSE, CAPILLARY
Glucose-Capillary: 153 mg/dL — ABNORMAL HIGH (ref 70–99)
Glucose-Capillary: 164 mg/dL — ABNORMAL HIGH (ref 70–99)
Glucose-Capillary: 166 mg/dL — ABNORMAL HIGH (ref 70–99)
Glucose-Capillary: 179 mg/dL — ABNORMAL HIGH (ref 70–99)
Glucose-Capillary: 184 mg/dL — ABNORMAL HIGH (ref 70–99)
Glucose-Capillary: 192 mg/dL — ABNORMAL HIGH (ref 70–99)
Glucose-Capillary: 227 mg/dL — ABNORMAL HIGH (ref 70–99)
Glucose-Capillary: 251 mg/dL — ABNORMAL HIGH (ref 70–99)
Glucose-Capillary: 271 mg/dL — ABNORMAL HIGH (ref 70–99)

## 2010-04-26 LAB — BASIC METABOLIC PANEL
BUN: 17 mg/dL (ref 6–23)
BUN: 29 mg/dL — ABNORMAL HIGH (ref 6–23)
CO2: 28 mEq/L (ref 19–32)
CO2: 28 mEq/L (ref 19–32)
Calcium: 8.4 mg/dL (ref 8.4–10.5)
Calcium: 8.6 mg/dL (ref 8.4–10.5)
Chloride: 104 mEq/L (ref 96–112)
Chloride: 108 mEq/L (ref 96–112)
Creatinine, Ser: 1.07 mg/dL (ref 0.4–1.5)
Creatinine, Ser: 1.48 mg/dL (ref 0.4–1.5)
GFR calc Af Amer: 57 mL/min — ABNORMAL LOW (ref >60)
GFR calc Af Amer: >60 mL/min (ref >60)
GFR calc non Af Amer: 47 mL/min — ABNORMAL LOW (ref >60)
GFR calc non Af Amer: >60 mL/min (ref >60)
Glucose, Bld: 122 mg/dL — ABNORMAL HIGH (ref 70–99)
Glucose, Bld: 153 mg/dL — ABNORMAL HIGH (ref 70–99)
Potassium: 3 mEq/L — ABNORMAL LOW (ref 3.5–5.1)
Potassium: 4.1 mEq/L (ref 3.5–5.1)
Sodium: 138 mEq/L (ref 135–145)
Sodium: 142 mEq/L (ref 135–145)

## 2010-04-26 LAB — CULTURE, BLOOD (ROUTINE X 2)
Culture: NO GROWTH
Culture: NO GROWTH
Report Status: 2202011
Report Status: 2202011

## 2010-04-26 LAB — CARDIAC PANEL(CRET KIN+CKTOT+MB+TROPI)
CK, MB: 1 ng/mL (ref 0.3–4.0)
CK, MB: 1.8 ng/mL (ref 0.3–4.0)
CK, MB: 1.8 ng/mL (ref 0.3–4.0)
CK, MB: 2.5 ng/mL (ref 0.3–4.0)
CK, MB: 2.8 ng/mL (ref 0.3–4.0)
Relative Index: 0.9 (ref 0.0–2.5)
Relative Index: 1.5 (ref 0.0–2.5)
Relative Index: 1.8 (ref 0.0–2.5)
Relative Index: 1.9 (ref 0.0–2.5)
Relative Index: INVALID (ref 0.0–2.5)
Total CK: 116 U/L (ref 7–232)
Total CK: 120 U/L (ref 7–232)
Total CK: 135 U/L (ref 7–232)
Total CK: 159 U/L (ref 7–232)
Total CK: 88 U/L (ref 7–232)
Troponin I: 0.02 ng/mL (ref 0.00–0.06)
Troponin I: 0.02 ng/mL (ref 0.00–0.06)
Troponin I: 0.02 ng/mL (ref 0.00–0.06)
Troponin I: 0.02 ng/mL (ref 0.00–0.06)
Troponin I: 0.03 ng/mL (ref 0.00–0.06)

## 2010-04-26 LAB — COMPREHENSIVE METABOLIC PANEL
ALT: 19 U/L (ref 0–53)
ALT: 25 U/L (ref 0–53)
AST: 18 U/L (ref 0–37)
AST: 23 U/L (ref 0–37)
Albumin: 2.9 g/dL — ABNORMAL LOW (ref 3.5–5.2)
Albumin: 3.2 g/dL — ABNORMAL LOW (ref 3.5–5.2)
Alkaline Phosphatase: 64 U/L (ref 39–117)
Alkaline Phosphatase: 75 U/L (ref 39–117)
BUN: 23 mg/dL (ref 6–23)
BUN: 31 mg/dL — ABNORMAL HIGH (ref 6–23)
CO2: 26 mEq/L (ref 19–32)
CO2: 28 mEq/L (ref 19–32)
Calcium: 8.4 mg/dL (ref 8.4–10.5)
Calcium: 8.5 mg/dL (ref 8.4–10.5)
Chloride: 105 mEq/L (ref 96–112)
Chloride: 109 mEq/L (ref 96–112)
Creatinine, Ser: 1.29 mg/dL (ref 0.4–1.5)
Creatinine, Ser: 1.51 mg/dL — ABNORMAL HIGH (ref 0.4–1.5)
GFR calc Af Amer: 56 mL/min — ABNORMAL LOW (ref >60)
GFR calc Af Amer: >60 mL/min (ref >60)
GFR calc non Af Amer: 46 mL/min — ABNORMAL LOW (ref >60)
GFR calc non Af Amer: 55 mL/min — ABNORMAL LOW (ref >60)
Glucose, Bld: 170 mg/dL — ABNORMAL HIGH (ref 70–99)
Glucose, Bld: 174 mg/dL — ABNORMAL HIGH (ref 70–99)
Potassium: 3.7 mEq/L (ref 3.5–5.1)
Potassium: 4.2 mEq/L (ref 3.5–5.1)
Sodium: 139 mEq/L (ref 135–145)
Sodium: 142 mEq/L (ref 135–145)
Total Bilirubin: 0.6 mg/dL (ref 0.3–1.2)
Total Bilirubin: 1.2 mg/dL (ref 0.3–1.2)
Total Protein: 6 g/dL (ref 6.0–8.3)
Total Protein: 6.4 g/dL (ref 6.0–8.3)

## 2010-04-26 LAB — LIPID PANEL
Cholesterol: 114 mg/dL (ref 0–200)
HDL: 45 mg/dL (ref >39)
LDL Cholesterol: 57 mg/dL (ref 0–99)
Total CHOL/HDL Ratio: 2.5 RATIO
Triglycerides: 61 mg/dL (ref <150)
VLDL: 12 mg/dL (ref 0–40)

## 2010-04-26 LAB — TSH: TSH: 0.477 u[IU]/mL (ref 0.350–4.500)

## 2010-04-26 LAB — MAGNESIUM: Magnesium: 2 mg/dL (ref 1.5–2.5)

## 2010-04-26 LAB — BRAIN NATRIURETIC PEPTIDE: Pro B Natriuretic peptide (BNP): 213 pg/mL — ABNORMAL HIGH (ref 0.0–100.0)

## 2010-04-27 NOTE — H&P (Signed)
NAME:  [name], [name]             ACCOUNT NO.:  [id number]  MEDICAL RECORD NO.:  [id number]           PATIENT TYPE:  LOCATION:                                 FACILITY:  PHYSICIAN:  Orvan Falconer, MD           DATE OF BIRTH:  1940/10/04  DATE OF ADMISSION: DATE OF DISCHARGE:  LH                             HISTORY & PHYSICAL   PRIMARY CARE PHYSICIAN:  Scott A. Wolfgang Phoenix, MD  REASON FOR ADMISSION:  Suspicious for PE.  ADVANCE DIRECTIVE:  Full code.  HISTORY OF PRESENT ILLNESS:  This is a 70 year old male with history of coronary artery disease status post stent placement in the late 1990s, history of sleep apnea on CPAP, morbid obesity, hypercholesterolemia, prior lower GI bleed with colonoscopy reportedly 1 year ago showing diverticulosis (e-chart shows his last colonoscopy was April 10, 2005 showing possible inflammatory disease) who had presented to primary care physician, Dr. Wolfgang Phoenix complaining of painless rectal bleeding.  It was one episode, but for the past couple of weeks he had some dark stool as well.  He also relates that he had some chest pain and lightheadedness which is unusual for him.  He never did have loss of consciousness or palpitations.  He does have history of COPD and has baseline shortness of breath which he states has not changed.  The pain he described was not pleuritic.  He did not have any calf pain or swelling.  Evaluation by Dr. Wolfgang Phoenix included a D-dimer which was elevated at 1.83 and because of that, a CT pulmonary angiogram was ordered.  It showed no definite PE, but it was limited because of the distal upper branches were not fully visualized.  He subsequently underwent a ventilation-perfusion scan which showed a large V/Q defect and was [name] as intermediate probability for pulmonary embolism.  He was given Lovenox and hospitalist was asked to admit the patient for further evaluation and treatment.  Currently, he is comfortable, not short of breath, and having  no chest pain.  He denies any fever, chills, or cough.  PAST MEDICAL HISTORY: 1. Degenerative joint disease status post knee replacement. 2. Hypertension. 3. GERD. 4. Dyslipidemia. 5. BPH. 6. Obstructive sleep apnea. 7. Hypokalemia. 8. Morbid obesity. 9. COPD. 10.Diverticulosis. 11.History of lower GI bleed.  ALLERGIES:  No known drug allergies.  CURRENT MEDICATIONS: 1. Lasix 40 mg per day. 2. Doxazosin 4 mg at bedtime. 3. Norvasc 10 mg per day. 4. Pravastatin 40 mg per day. 5. Diovan 160 mg per day. 6. Lopressor 150 mg per day. 7. Ditropan 10 mg per day. 8. CPAP with O2 supplement.  SOCIAL HISTORY:  The patient lives in [place].  He was retired Theatre manager for [employer].  He had gained significant amount of weight since his wife died in 04-11-2003.  FAMILY HISTORY:  Noncontributory.  PHYSICAL EXAMINATION:  VITAL SIGNS:  Blood pressure is 129/55, pulse 80, respiratory rate of 16, and temperature 98.4. GENERAL:  He is alert and oriented and in no apparent distress.  He does not look dyspneic or tachypneic.  He is morbidly obese. HEENT:  His sclerae are nonicteric.  His speech is fluent.  Tongue is midline. NECK:  He has no JVD.  No stridor. CARDIAC:  S1 and S2 regular.  I did not hear any murmur, rub, or gallop. LUNGS:  Clear with no wheezes, rales, or any evidence of consolidation. ABDOMEN:  Obese, nondistended, and nontender. EXTREMITIES:  Edema 1+.  No calf tenderness.  Good distal pulses bilaterally.  He has no peripheral or central cyanosis. SKIN:  Warm and dry. NEUROLOGIC:  Unremarkable. PSYCHIATRIC:  Unremarkable.  OBJECTIVE FINDINGS:  White count of 10,600, hemoglobin of 14.4, MCV of 88.4.  D-dimer 1.93.  V/Q scan showed intermediate probability for right upper lobe pulmonary embolism.  CT pulmonary angiogram is indeterminable.  Please see further report.  IMPRESSION:  This is a 70 year old with history of lower gastrointestinal bleed, recently had  lower gastrointestinal bleed that was painless and consistent with diverticular bleed along with having chest pain and intermediate probability by V/Q scan for pulmonary embolism with CT pulmonary angiogram undeterminable.  He has no tachycardia.  He also did not have any pleuritic chest pain and his emphysema and baseline shortness of breath clouded the clinical diagnosis.  He also has increased risk for anticoagulation given that he has lower gastrointestinal bleed.  Nevertheless, I am concerned and he will be fully treated with full anticoagulation.  Risks and benefits including risk of increased bleeding explained to him and his daughter and they concurred.  I believe that since he has active lower gastrointestinal bleed that it is better to use IV heparin because of its shorter half life, and I will use that instead of Lovenox.  I will also hold off on Coumadin as I believe consultation with pulmonologist for further recommendation will be of help.  He had been on Coumadin before for prophylaxis after his knee surgery, so he is aware of the protocol for taking Coumadin.  I do believe that he should eventually be treated completely for his pulmonary embolism.  The rest of his medications will be continued.  We will admit him to telemetry. He is a full code.     Orvan Falconer, MD     PL/MEDQ  D:  04/12/2010  T:  04/12/2010  Job:  [id number]  Electronically Signed by Orvan Falconer  on 04/27/2010 11:08:08 PM

## 2010-05-07 HISTORY — PX: COLONOSCOPY: SHX174

## 2010-05-09 ENCOUNTER — Telehealth: Payer: Self-pay

## 2010-05-09 NOTE — Telephone Encounter (Signed)
Pt came to office- c/o bright red blood when passing gas and when wiping. Pt is in no pain, no n/v. He is scheduled for ov 06/16/10. Pt was given abx for hpylori at the hospital and he has finished them. Pt wants to know what he can do? Please advise.

## 2010-05-10 NOTE — Telephone Encounter (Signed)
Pt is aware of his new appt time for Monday 4/9 @ 0800 with LSL

## 2010-05-10 NOTE — Telephone Encounter (Signed)
Monitor for any more bleeding. Avoid constipation. Miralax as needed. Needs sooner appt thatn 5/11. In next week or so.

## 2010-05-10 NOTE — Telephone Encounter (Signed)
Pt aware. Would like appt asap if possible

## 2010-05-15 ENCOUNTER — Encounter: Payer: Self-pay | Admitting: Gastroenterology

## 2010-05-15 ENCOUNTER — Ambulatory Visit (INDEPENDENT_AMBULATORY_CARE_PROVIDER_SITE_OTHER): Payer: Medicare Other | Admitting: Gastroenterology

## 2010-05-15 VITALS — BP 147/71 | HR 68 | Temp 98.6°F | Ht 73.0 in | Wt 297.0 lb

## 2010-05-15 DIAGNOSIS — K259 Gastric ulcer, unspecified as acute or chronic, without hemorrhage or perforation: Secondary | ICD-10-CM | POA: Insufficient documentation

## 2010-05-15 DIAGNOSIS — K625 Hemorrhage of anus and rectum: Secondary | ICD-10-CM

## 2010-05-15 NOTE — Assessment & Plan Note (Signed)
Small gastric ulcers felt to be due to NSAIDS. Due for followup EGD in June 2012.

## 2010-05-15 NOTE — Assessment & Plan Note (Signed)
Persistent moderate volume hematochezia unlikely to be associated with history of gastric ulcers. He has prior history of abnormal colon from the rectum to 40 cm. There was a biopsy with questionable IBD versus resolving infection. At that time he did test positive for C. difficile. Given ongoing bleeding would have to be concerned about possibility of underlying IBD. Would pursue colonoscopy at this time. Discussed option of waiting till June when he is due for his followup EGD however based on his description of moderate volume hematochezia did not feel like we should put this off at this point. I have discussed the risks, alternatives, benefits with regards to but not limited to the risk of reaction to medication, bleeding, infection, perforation and the patient is agreeable to proceed. Written consent to be obtained. We'll also check a CBC.

## 2010-05-15 NOTE — Progress Notes (Signed)
Primary Care Physician:  Sallee Lange, MD  Chief Complaint  Patient presents with  . Pre-op Exam    colonoscopy, rectal bleeding    HPI:  [name] is a 70 y.o. male here for further evaluation of ongoing rectal bleeding. We saw him during a hospitalization in March of this year. At that time he presented with GI bleeding and abdominal pain. He also had some chest pain and lightheadedness and was worked up for possibility of a pulmonary emboli. It was felt that his breathing issues were multifactorial but not likely due to a PE. He had been complaining of vague diffuse abdominal discomfort from the umbilicus to the epigastrium. He complained of feeling bloated. He also has had chronic bloody stools. Symptoms worse over the 3 weeks prior to his hospitalization. Of note he was given Cipro and Flagyl according to the medication reconciliation list prior to that hospitalization. He also been on Advil please to 4 times a day for over 40 years. He underwent an EGD by Dr. Gala Romney. He was found to have 3 small gastric ulcers. He has been off Advil since that time but recently resumed his aspirin. The plan was to bring him back in 12 weeks to followup the gastric ulcers (EGD) and colonoscopy. He called last week complaining of rectal bleeding. He felt he was losing too much blood. No melena. He seemed bright red blood in his shorts and with each bowel movement. He has at least 2-3 bloody stools daily. His last colonoscopy was in 2007. At that time he had granularity and friability erosions from the rectum to 40 cm. Biopsies were inconclusive but felt to be IBD versus infection. He did have C. difficile at that time was treated and had resolution of the symptoms at that point. He says he was treated for H. pylori after his last hospitalization however I am unable to find any evidence that he was given treatment at time of this discharge. His H. pylori serologies were negative.  Current Outpatient Prescriptions   Medication Sig Dispense Refill  . amLODipine (NORVASC) 10 MG tablet Take 1 tablet by mouth daily.      Marland Kitchen aspirin 81 MG tablet Take 162 mg by mouth daily.        Marland Kitchen DIOVAN 160 MG tablet Take 1 tablet by mouth daily.      Marland Kitchen doxazosin (CARDURA) 8 MG tablet Take 0.5 tablets by mouth daily.      . furosemide (LASIX) 40 MG tablet Take 1 tablet by mouth as needed.      . metoprolol (TOPROL-XL) 50 MG 24 hr tablet Take 150 mg by mouth daily.        Marland Kitchen NITROSTAT 0.4 MG SL tablet Take 1 tablet by mouth as needed.      Marland Kitchen omeprazole (PRILOSEC) 40 MG capsule Take 1 tablet by mouth daily.      Marland Kitchen oxybutynin (DITROPAN-XL) 10 MG 24 hr tablet Take 1 tablet by mouth daily.      . pravastatin (PRAVACHOL) 40 MG tablet Take 1 tablet by mouth daily.        Allergies as of 05/15/2010  . (No Known Allergies)    Past Medical History  Diagnosis Date  . Hypertension   . DJD (degenerative joint disease)   . GERD (gastroesophageal reflux disease)   . Hyperlipidemia   . BPH (benign prostatic hyperplasia)   . Obstructive sleep apnea   . Diverticulosis   . Gastric ulcer 04/17/10    Three 69m  gastric ulcers, H.pylori serologies were negative  . Clostridium difficile colitis 04/2005    Past Surgical History  Procedure Date  . Colonoscopy 04/2005    granularity and friability erosions from rectum to 40cm. Bx infection vs IBD. C. Diff positive at the time.   . Knee surgery two  . Shoulder surgery two  . Foot surgery two  . Tonsillectomy   . Cervical spine surgery     C4-5    Family History  Problem Relation Age of Onset  . Lung cancer Mother   . Stroke Father   . Colon cancer Neg Hx     History   Social History  . Marital Status: Widowed    Spouse Name: [name]    Number of Children: [name]  . Years of Education: [name]   Occupational History  . maintenance     retired   Social History Main Topics  . Smoking status: Former Research scientist (life sciences)  . Smokeless tobacco: Never Used  . Alcohol Use: No  . Drug Use: No  .  Sexually Active: Yes -- Male partner(s)    Birth Control/ Protection: None     spouse      ROS:  General: Negative for anorexia, weight loss, fever, chills. C/O fatigue, weakness. Eyes: Negative for vision changes.  ENT: Negative for hoarseness, difficulty swallowing , nasal congestion. CV: Negative for chest pain, angina, palpitations, peripheral edema.  Respiratory: Negative for dyspnea at rest. C/O dyspnea on exertion. Cough with yellow sputum. No wheezing.  GI: See history of present illness. GU:  Negative for dysuria, hematuria, urinary incontinence, urinary frequency, nocturnal urination.  MS: Negative for joint pain, low back pain.  Derm: Negative for rash or itching.  Neuro: Negative for weakness, abnormal sensation, seizure, frequent headaches, memory loss, confusion.  Psych: Negative for anxiety, depression, suicidal ideation, hallucinations.  Endo: Negative for unusual weight change.  Heme: Negative for bruising. Allergy: Negative for rash or hives.    Physical Examination: BP 147/71  Pulse 68  Temp 98.6 F (37 C)  Ht 6' 1"  (1.854 m)  Wt 297 lb (134.718 kg)  BMI 39.18 kg/m2  SpO2 95%   General: Well-nourished, well-developed in no acute distress.  Head: Normocephalic, atraumatic.   Eyes: Conjunctiva pink, no icterus. Mouth: Oropharyngeal mucosa moist and pink , no lesions erythema or exudate. Neck: Supple without thyromegaly, masses, or lymphadenopathy.  Lungs: Clear to auscultation bilaterally.  Heart: Regular rate and rhythm, no murmurs rubs or gallops.  Abdomen: Bowel sounds are normal, nontender, nondistended, no hepatosplenomegaly or masses, no abdominal bruits or hernia , no rebound or guarding.   Extremities: No lower extremity edema.  Neuro: Alert and oriented x 4 , grossly normal neurologically.  Skin: Warm and dry, no rash or jaundice.   Psych: Alert and cooperative, normal mood and affect.

## 2010-05-15 NOTE — Progress Notes (Signed)
Reviewed by R. Michael Babita Amaker, MD FACP FACG 

## 2010-05-17 ENCOUNTER — Other Ambulatory Visit: Payer: Self-pay | Admitting: Gastroenterology

## 2010-05-17 LAB — CBC WITH DIFFERENTIAL/PLATELET
Basophils Absolute: 0 10*3/uL (ref 0.0–0.1)
Basophils Relative: 0 % (ref 0–1)
Eosinophils Absolute: 0.2 10*3/uL (ref 0.0–0.7)
Eosinophils Relative: 2 % (ref 0–5)
HCT: 43.2 % (ref 39.0–52.0)
Hemoglobin: 14.6 g/dL (ref 13.0–17.0)
Lymphocytes Relative: 38 % (ref 12–46)
Lymphs Abs: 2.8 10*3/uL (ref 0.7–4.0)
MCH: 30 pg (ref 26.0–34.0)
MCHC: 34.3 g/dL (ref 30.0–36.0)
MCV: 88.7 fL (ref 78.0–100.0)
Monocytes Absolute: 0.5 10*3/uL (ref 0.1–1.0)
Monocytes Relative: 7 % (ref 3–12)
Neutro Abs: 3.7 10*3/uL (ref 1.7–7.7)
Neutrophils Relative %: 50 % (ref 43–77)
Platelets: 183 10*3/uL (ref 150–400)
RBC: 4.87 MIL/uL (ref 4.22–5.81)
RDW: 13 % (ref 11.5–15.5)
WBC: 7.4 10*3/uL (ref 4.0–10.5)

## 2010-05-18 ENCOUNTER — Encounter: Payer: Medicare Other | Admitting: Internal Medicine

## 2010-05-18 ENCOUNTER — Other Ambulatory Visit: Payer: Self-pay | Admitting: Internal Medicine

## 2010-05-18 ENCOUNTER — Ambulatory Visit (HOSPITAL_COMMUNITY)
Admission: RE | Admit: 2010-05-18 | Discharge: 2010-05-18 | Disposition: A | Discharge status: Home / Self Care | Payer: Medicare Other | Source: Ambulatory Visit | Attending: Internal Medicine | Admitting: Internal Medicine

## 2010-05-18 DIAGNOSIS — K5730 Diverticulosis of large intestine without perforation or abscess without bleeding: Secondary | ICD-10-CM | POA: Insufficient documentation

## 2010-05-18 DIAGNOSIS — Z7982 Long term (current) use of aspirin: Secondary | ICD-10-CM | POA: Insufficient documentation

## 2010-05-18 DIAGNOSIS — K6389 Other specified diseases of intestine: Secondary | ICD-10-CM

## 2010-05-18 DIAGNOSIS — E785 Hyperlipidemia, unspecified: Secondary | ICD-10-CM | POA: Insufficient documentation

## 2010-05-18 DIAGNOSIS — K529 Noninfective gastroenteritis and colitis, unspecified: Secondary | ICD-10-CM

## 2010-05-18 DIAGNOSIS — K921 Melena: Secondary | ICD-10-CM

## 2010-05-18 DIAGNOSIS — Z79899 Other long term (current) drug therapy: Secondary | ICD-10-CM | POA: Insufficient documentation

## 2010-05-18 DIAGNOSIS — K5289 Other specified noninfective gastroenteritis and colitis: Secondary | ICD-10-CM

## 2010-05-18 DIAGNOSIS — I10 Essential (primary) hypertension: Secondary | ICD-10-CM | POA: Insufficient documentation

## 2010-05-18 HISTORY — DX: Other specified diseases of intestine: K63.89

## 2010-05-18 HISTORY — DX: Noninfective gastroenteritis and colitis, unspecified: K52.9

## 2010-05-18 NOTE — Discharge Summary (Signed)
NAME:  [name], [name]             ACCOUNT NO.:  [id number]  MEDICAL RECORD NO.:  [id number]           PATIENT TYPE:  I  LOCATION:  A302                          FACILITY:  [facility]  PHYSICIAN:  Neriah Brott L. Conley Canal, MDDATE OF BIRTH:  1940-09-07  DATE OF ADMISSION:  04/12/2010 DATE OF DISCHARGE:  03/11/2012LH                              DISCHARGE SUMMARY   DISCHARGE DIAGNOSES: 1. Chronic dyspnea, improved suspect multifactorial related to weight,     deconditioning, and chronic obstructive pulmonary disease. 2. Reported hematochezia without any witnessed bleeding here but     stools were Hemoccult positive. 3. Gastric ulcer. 4. Chronic nonsteroidal anti-inflammatory use. 5. Gastroesophageal reflux disease. 6. Degenerative joint disease. 7. Hypertension. 8. Hyperlipidemia. 9. Benign prostatic hypertrophy. 10.Obstructive sleep apnea. 11.Hypokalemia. 12.Degenerative disk disease. 13.Previous colonoscopy showing possible inflammatory bowel disease. 14.Questionable lung nodules on CT of the chest needs followup CT     chest with contrast in 4-6 months.  DISCHARGE MEDICATIONS:  Stop nonsteroidal antiinflammatories, hold aspirin for 2 weeks and resume if no further bleeding, Tylenol 650 mg p.o. q.4 h p.r.n. pain, omeprazole 40 mg a day, Spiriva 1 puff daily, Ditropan XL 10 mg nightly, Diovan 160 mg nightly, doxazosin 4 mg nightly, Lasix 60 mg twice a day, metoprolol 150 mg nightly, sublingual nitroglycerin as needed for chest pain, Norvasc 10 mg a day, and Pravachol 40 mg a day.  CONDITION:  Stable.  ACTIVITY:  Ad lib.  FOLLOWUP:  Follow up with Dr. Gala Romney in 2 months.  He is to call for an appointment.  An outpatient colonoscopy and repeat EGD will be scheduled.  Follow up with Dr. Sallee Lange as previously scheduled on March 10.  Please follow up H. pylori serologies and treat if positive.  DIET:  It should be heart-healthy.  CONSULTATIONS:  Dr. Garfield Cornea  PROCEDURES:   EGD which showed 5-mm antral ulcers.  LABORATORY DATA:  H. pylori antibody is pending.  On admission CBC normal.  D-dimer 1.93.  Basic metabolic panel normal.  Liver function test a day prior to admission normal.  At discharge, his potassium is 3.2 and his potassium is being repleted.  Cardiac enzymes negative.  BNP less than 30.  TSH 1.655.  Hemoccult of the stool was positive, stool for C diff, PCR negative, fecal lactoferrin positive, stool cultures and ova parasite were ordered but have not been collected.  Lipase normal.  DIAGNOSTICS:  CT angiogram of the chest was limited due to incomplete opacification particularly the upper lobes.  No definite pulmonary emboli in the mid to lower lungs, but unable to exclude small emboli in the upper lobe branches, emphysematous changes and peribronchial thickening with bibasilar atelectasis.  V/Q scan with intermediate probability with moderate to large V/Q mismatch at the right upper lobe. Ultrasound Dopplers of the legs negative for DVT.  CT angiogram, repeat CT angiogram of the chest was negative for PE, but questionable lung nodules recommend.  Followup CT with contrast in 4-6 months.  Echocardiogram shows vigorous systolic function with ejection fraction of 75-80%, mild to moderate LVH, no regional wall motion abnormalities, grade 1 diastolic dysfunction, right ventricle had normal  systolic function.  HISTORY AND HOSPITAL COURSE:  Please see H and P for details.  [name] is a 70 year old white male who is a somewhat difficult historian but reportedly went to see Dr. Sallee Lange in the office for hematochezia.  Over the past several days, he has had loose stools with small amounts of blood.  He also complained of some epigastric discomfort and abdominal bloating.  He had no complaints of shortness of breath but Dr. Wolfgang Phoenix noted that the patient appeared short of breath after walking in the office.  An outpatient D-dimer was done  which was slightly elevated.  CT angiogram was done which showed no pulmonary embolus, but the pulmonary arteries were incompletely opacified.  The patient was sent to the emergency room.  He did have heme-positive, nonbloody stool.  V/Q scan was done which showed intermediate probability for PE.  When I examined the patient, he had no complaints of chest pain or shortness of breath.  His lungs were clear.  He was meandering historian.  He had no leg pain or edema.  Dopplers were done and were negative for DVT.  Repeat angiogram showed no PE.  The patient initially was placed on empiric heparin drip briefly while workup was underway. I consulted GI and an ulcer was found which may be NSAID related.  He reports that he takes NSAID daily.  Also H. pylori serology was collected and is at this time pending.  He was started on Spiriva, he has a history of COPD, based on previous pulmonary function tests.  He continued to have loose stool which was nonbloody, but the patient reports that it had occasional blood specks in it.  He was started on proton pump inhibitor.  His discomfort and loose stool was improving. His dyspnea is improving.  He has had no problems with hypoxia.  I will ask that the nurses ambulate with him and check a room air, ambulatory pulse oximeter before she goes.  Dr. Gala Romney will repeat the EGD in 3 months, and will probably total colonoscopy.  He may have NSAID related colitis causing some of his symptoms.  Total time on the day of discharge is greater than 30 minutes.     Ethne Jeon L. Conley Canal, MD     CLS/MEDQ  D:  04/16/2010  T:  04/17/2010  Job:  [id number]  cc:   Dr. Sedonia Small A. Wolfgang Phoenix, MD Fax: [phone]  Electronically Signed by Doree Barthel MD on 05/18/2010 11:05:20 AM

## 2010-05-19 LAB — CLOSTRIDIUM DIFFICILE BY PCR: Toxigenic C. Difficile by PCR: NEGATIVE

## 2010-05-22 NOTE — Op Note (Signed)
NAME:  [name], [name]             ACCOUNT NO.:  [id number]  MEDICAL RECORD NO.:  [id number]           PATIENT TYPE:  O  LOCATION:  DAYP                          FACILITY:  [facility]  PHYSICIAN:  R. Garfield Cornea, M.D. DATE OF BIRTH:  Mar 14, 1940  DATE OF PROCEDURE:  05/18/2010 DATE OF DISCHARGE:                              OPERATIVE REPORT   INDICATIONS FOR PROCEDURE:  A pleasant 70 year old gentleman with at least several-week history of bloody loose stools, reportedly treated for C. diff associated diarrhea earlier this year, history of taking Advil on a regular basis for years and history of inflamed distal rectum and colon on colonoscopy back in 2007.  It is notable this gentleman did well from 2007 with resolution of his lower GI tract symptoms at that time until just the past several weeks.  Colonoscopy is now being done. Risks, benefits, limitation, alternatives, and imponderables have been discussed, questions answered.  Please see the documentation in the medical record.  PROCEDURE NOTE:  O2 saturation, blood pressure, pulse, and respirations were monitored throughout the entire procedure.  CONSCIOUS SEDATION:  Versed 4 mg IV, Demerol 75 mg IV in divided doses.  INSTRUMENT:  Pentax video chip system.  FINDINGS:  Digital rectal exam revealed no abnormalities.  Endoscopic findings:  The prep was adequate.  Rectum:  Examination of rectal mucosa demonstrated diffusely abnormal rectal mucosa with granularity, friability, and diffuse erosions of the mucosa.  There was complete loss with normal vascular pattern.  The rectal mucosa was well seen. Retroflexion of the distal rectum was also performed with findings as outlined above.  Colon:  Colonic mucosa was surveyed from the rectosigmoid junction on through the left transverse right colon to the appendiceal orifice, ileocecal valve/cecum.  These structures were well seen and photographed for the record.  Terminal ileum was  intubated to 10 cm.  From this level, the scope was slowly and cautiously withdrawn.  All previously mentioned mucosal surfaces were again seen.  The inflammatory changes seen in the rectum extended through the sigmoid segment to approximately 40 cm at which level they tapered off with endoscopically normal- appearing descending, transverse, and ascending segments.  The terminal ileal mucosa appeared normal.  The patient had shallow sigmoid diverticular changes as well.  Fresh stool specimen was sent to lab for C. diff PCR assay and culture, biopsies of the normal-appearing descending colon, the abnormal- appearing sigmoid and rectal mucosa were taken.  The patient tolerated the procedure well.  Cecal withdrawal time 12 minutes.  IMPRESSION:  Markedly abnormal rectum and sigmoid colon as described above, consistent with left-sided proctocolitis, shallow sigmoid diverticula.  The more proximal colon appeared normal status post segmental biopsy and stool sampling.  Today's findings are most consistent with etiopathic inflammatory boweldisease (i.e., idiopathic ulcerative colitis).  The patient may well have had Clostridium difficile in the past.  These findings are not consistent with an infectious process or ischemia.  Moreover, his longstanding nonsteroidal drug use may be exacerbating this condition.  RECOMMENDATIONS: 1. Follow up on pending stool studies and path. 2. I anticipate initiating therapy for inflammatory bowel disease in     the  near future. 3. Minimize nonsteroidal anti-inflammatory drug use.     Bridgette Habermann, M.D.     RMR/MEDQ  D:  05/18/2010  T:  05/19/2010  Job:  [id number]  cc:   Nicki Reaper A. Wolfgang Phoenix, MD Fax: [phone]  Electronically Signed by Jannette Spanner M.D. on 05/22/2010 12:35:30 PM

## 2010-05-23 LAB — STOOL CULTURE

## 2010-05-23 NOTE — Progress Notes (Signed)
Done

## 2010-05-24 ENCOUNTER — Encounter: Payer: Self-pay | Admitting: Internal Medicine

## 2010-05-24 NOTE — Progress Notes (Signed)
Pt is aware of OV for 07/17/10 @ 8am with LSL

## 2010-06-01 ENCOUNTER — Encounter: Payer: Self-pay | Admitting: Internal Medicine

## 2010-06-02 ENCOUNTER — Encounter: Payer: Self-pay | Admitting: Internal Medicine

## 2010-06-02 NOTE — Progress Notes (Signed)
Colon biopsies revealed chronic active proctocolitis. C. Difficile PCR negative. Patient most likely has idiopathic inflammatory bowel disease.  Let's start Rowasa enemas one enema per rectum at bedtime times one month with office followup. Please send a pathology report to primary care physician/referring physician.

## 2010-06-02 NOTE — Progress Notes (Signed)
Cc Path to PCP

## 2010-06-02 NOTE — Progress Notes (Signed)
Biopsies revealed left-sided proctocolitis. Patient needs to be treated for idiopathic inflammatory bowel disease. I recommend Robles the enemas one per rectum at bedtime times one month and then followup with extender here. Please let him know.  Path report to primary care.

## 2010-06-05 NOTE — Progress Notes (Signed)
Pt aware, rx called to [facility]/ [place]. Pt needs follow up ov scheduled please.

## 2010-06-06 NOTE — Consult Note (Signed)
NAME:  [name], [name]             ACCOUNT NO.:  [id number]  MEDICAL RECORD NO.:  [id number]           PATIENT TYPE:  I  LOCATION:  A302                          FACILITY:  [facility]  PHYSICIAN:  R. Garfield Cornea, M.D. DATE OF BIRTH:  1940/12/20  DATE OF CONSULTATION:  04/13/2010 DATE OF DISCHARGE:                                CONSULTATION   PRIMARY CARE PHYSICIAN:  Scott A. Wolfgang Phoenix, MD  REFERRING PHYSICIAN:  Corinna L. Conley Canal, MD  GASTROENTEROLOGIST:  Dr. Lillia Carmel.  REASON FOR CONSULTATION:  GI bleed and abdominal pain.  HISTORY OF PRESENT ILLNESS:  [name] is a pleasant 70 year old Caucasian male, who is quite talkative.  He is somewhat difficult to obtain history from; however, he presented to Dr. Lance Sell office with painless rectal bleeding on Tuesday.  He also had reported some chest pain and lightheadedness without any loss of consciousness or palpitations.  He does have a history of COPD and baseline shortness of breath, which has not changed; however, a D-dimer was performed, which is elevated at 1.83 and subsequently a CTA was ordered without significant defining characteristics of a PE.  He then underwent a V/Q scan, which showed a large defect that was read as intermediate probability for pulmonary embolism.  He then was admitted for further workup and treatment, and actually had been started on a heparin drip. He reports a 3-week history of vague diffuse abdominal discomfort that extends from his umbilicus to epigastric area.  He is unable to characterize this pain, but he states that he feels bloated and like he ate too much.  He does feel increased discomfort and bloating with eating.  He complains of loose and soft BMs x3 weeks.  He states most of these are after meals.  He reports rectal bleeding and paper hematochezia.  As of note actually, I saw evidence of his stool that was soft liquid brown with only a small dime size amount of burgundy  tinged blood in the stool.  He denies any sick contacts or any recent antibiotics.  He denies any reflux, dysphagia, or odynophagia.  He does have nausea intermittently.  He reports taking Advil at least 2 to 4 times a day times for the past 4 years.  As of note, he was last seen in our office in April 2007.  Last colonoscopy was in April 2007, secondary to bloody diarrhea.  Findings of granularity and friability erosions, loss of normal vascular pattern.  It was thought that this could possibly be ulcerative colitis; however, he did have refractory C. diff at this time as well, and he was treated appropriately for such.  This resolved, and we had actually not seen him back since then.  PAST MEDICAL HISTORY:  Significant for; 1. Degenerative joint disease. 2. Hypertension. 3. GERD. 4. Dyslipidemia. 5. BPH. 6. Obstructive sleep apnea. 7. Morbid obesity. 8. COPD. 9. Diverticulosis. 10.History of rectal bleeding in the past in 2007, possible UC as well     as refractory C. diff.  PAST SURGICAL HISTORY:  He has had 2 knee surgeries, 2 shoulder surgeries, 2 feet operations, tonsillectomy, and C4-C5.  FAMILY HISTORY:  His mother is deceased from lung cancer.  Father had a CVA.  He denies any family history of colon cancer.  SOCIAL HISTORY:  He lives in [place].  He is retired from [employer]; however, he still does maintain from side.  His wife passed away in 04/07/03.  He denies any smoking; however, he does have a remote smoking history.  He uses alcohol occasionally.  ALLERGIES:  He has no known drug allergies.  CURRENT MEDICATIONS:  For this hospitalization include; 1. Norvasc 10 mg orally daily. 2. Cardura 4 mg p.o. every night. 3. Lasix 40 mg oral daily. 4. Metoprolol 150 mg p.o. daily. 5. Benicar 20 mg p.o. daily. 6. Ditropan 10 mg p.o. daily. 7. Protonix 40 mg p.o. daily. 8. Zocor 20 mg p.o. every evening. 9. Tylenol as needed. 10.Morphine 2-4 mg IV as needed for  pain.  REVIEW OF SYSTEMS:  Negative except as mentioned in HPI.  PHYSICAL EXAMINATION:  VITAL SIGNS:  BP 137/76, pulse 69, respirations 20, temp 97.9, and 93% on room air. GENERAL:  He is in no apparent distress.  He is alert and oriented, morbidly obese. HEENT:  Sclera is nonicteric. NECK:  Without mass or thyromegaly. CARDIAC:  S1 and S2 present.  No murmurs, rubs, or gallops. LUNGS:  Clear to auscultation bilaterally. ABDOMEN:  Obese and soft.  He has significant tenderness to palpation in the left upper quadrant; otherwise benign. EXTREMITIES:  There was 1+ edema.  No calf tenderness. SKIN:  Warm and dry. NEUROLOGIC:  He is alert and oriented.  LABORATORY DATA:  Pertinent labs for this admission include lipase of 28.  Most recent CBC with H and H of 14.4 and 42.5, and white blood cell 10.6.  BMP with sodium 137 and potassium 3.5.  LFTs are all within normal limits.  D-dimer was elevated 1.93 on April 12, 2010.  PERTINENT RADIOLOGY PROCEDURES:  Two-view of abdomen on April 11, 2010, without any acute findings.  CT angiogram of the chest limited in the assessment.  No definite pulmonary emboli seen.  V/Q scan then showed intermediate probability of pulmonary embolism.  Ultrasound of the lower extremities showed no evidence of DVT.  ASSESSMENT AND PLAN:  [name] is a pleasant 70 year old male with a remote history of refractory Clostridium difficile as well as possible ulcerative colitis, who has not followed up with [place] since 06-Apr-2005.  He presents for this admission with questionable pulmonary embolism; however, his clinical course does not seem to indicate acute pulmonary embolism.  His main complaint is of bloating and abdominal distention as well as discomfort that extends from his umbilicus to his epigastric area.  He does take NSAIDs routinely for 4 years, so he is at high-risk for peptic ulcer disease.  As for his bowel movements, we will continue to monitor his clinical  course, followup on stool studies, and follow his H and H.  We will set him up for an upper endoscopy on April 14, 2010, to assess for any ulcerative origin, as he may end up having to need anticoagulation in the future for possible PE.  We will most likely proceed with a colonoscopy outpatient, as this is not urgent at this time, although he does have small amounts of burgundy tinged blood dime size.  This is not overt bleeding or gross bleeding.  His H and H is stable.  Again, we will follow up with this.  We will like to thank you for the referral of this nice gentleman.  ______________________________ Laban Emperor, ANP-BC   ______________________________ R. Garfield Cornea, M.D.    AS/MEDQ  D:  04/13/2010  T:  04/13/2010  Job:  [id number]  cc:   R. Garfield Cornea, M.D. [address] [place] [address]  Scott A. Wolfgang Phoenix, MD Fax: [phone]  Electronically Signed by Laban Emperor  on 05/31/2010 02:05:24 PM Electronically Signed by Jannette Spanner M.D. on 06/06/2010 07:45:21 PM

## 2010-06-06 NOTE — Progress Notes (Signed)
Pt is aware of his OV on 07/17/10 @ 0800 with LSL

## 2010-06-10 ENCOUNTER — Emergency Department (HOSPITAL_COMMUNITY): Payer: Medicare Other

## 2010-06-10 ENCOUNTER — Emergency Department (HOSPITAL_COMMUNITY)
Admission: EM | Admit: 2010-06-10 | Discharge: 2010-06-10 | Disposition: A | Discharge status: Home / Self Care | Payer: Medicare Other | Attending: Emergency Medicine | Admitting: Emergency Medicine

## 2010-06-10 DIAGNOSIS — Y998 Other external cause status: Secondary | ICD-10-CM | POA: Insufficient documentation

## 2010-06-10 DIAGNOSIS — E7800 Pure hypercholesterolemia, unspecified: Secondary | ICD-10-CM | POA: Insufficient documentation

## 2010-06-10 DIAGNOSIS — I2510 Atherosclerotic heart disease of native coronary artery without angina pectoris: Secondary | ICD-10-CM | POA: Insufficient documentation

## 2010-06-10 DIAGNOSIS — I10 Essential (primary) hypertension: Secondary | ICD-10-CM | POA: Insufficient documentation

## 2010-06-10 DIAGNOSIS — Z79899 Other long term (current) drug therapy: Secondary | ICD-10-CM | POA: Insufficient documentation

## 2010-06-10 DIAGNOSIS — S93409A Sprain of unspecified ligament of unspecified ankle, initial encounter: Secondary | ICD-10-CM | POA: Insufficient documentation

## 2010-06-10 DIAGNOSIS — Z7982 Long term (current) use of aspirin: Secondary | ICD-10-CM | POA: Insufficient documentation

## 2010-06-10 DIAGNOSIS — K219 Gastro-esophageal reflux disease without esophagitis: Secondary | ICD-10-CM | POA: Insufficient documentation

## 2010-06-10 DIAGNOSIS — X500XXA Overexertion from strenuous movement or load, initial encounter: Secondary | ICD-10-CM | POA: Insufficient documentation

## 2010-06-10 DIAGNOSIS — Y9301 Activity, walking, marching and hiking: Secondary | ICD-10-CM | POA: Insufficient documentation

## 2010-06-10 DIAGNOSIS — M25579 Pain in unspecified ankle and joints of unspecified foot: Secondary | ICD-10-CM | POA: Insufficient documentation

## 2010-06-10 DIAGNOSIS — G4730 Sleep apnea, unspecified: Secondary | ICD-10-CM | POA: Insufficient documentation

## 2010-06-16 ENCOUNTER — Ambulatory Visit: Payer: Medicare Other | Admitting: Internal Medicine

## 2010-06-20 NOTE — Procedures (Signed)
NAME:  [name], [name]             ACCOUNT NO.:  [id number]   MEDICAL RECORD NO.:  [id number]          PATIENT TYPE:  OUT   LOCATION:  SLEE                          FACILITY:  [facility]   PHYSICIAN:  Kofi A. Merlene Laughter, M.D. DATE OF BIRTH:  [birth year]   DATE OF PROCEDURE:  08/01/2008  DATE OF DISCHARGE:  08/01/2008                             SLEEP DISORDER REPORT   REFERRING PHYSICIAN:  Melissa Montane, MD.   INDICATIONS:  This is a 70 year old who presents with hypersomnia,  snoring, and is being evaluated for obstructive sleep apnea syndrome.   Epworth sleepiness scale 15.  BMI 37.   MEDICATIONS:  Pravastatin, Diovan, Norvasc, Lasix, nitroglycerin,  Ditropan, Advil, metoprolol, aspirin, and doxazosin.   ARCHITECTURAL SUMMARY:  This is a split night recording.  The first  portion is a diagnostic and a second a titration recording.  The total  recording time is 419 minutes.  The sleep efficiency 78%.  Sleep latency  13 minutes.  REM latency 119 minutes.   RESPIRATORY SUMMARY:  Baseline oxygen saturation 94.  Lowest saturation  83.  The diagnostic AHI is 70.  The patient was placed on positive  pressure between 5 and 14.  The optimal pressure is 12 with resolution  of obstructive events.   LIMB MOVEMENTS SUMMARY:  PLM index 29.   ELECTROCARDIOGRAM SUMMARY:  Average heart rate 75 with isolated PVCs  observed.   IMPRESSION:  Severe obstructive sleep apnea syndrome, which responds  well to CPAP of 12.   Thanks for this referral.      Kofi A. Merlene Laughter, M.D.  Electronically Signed     KAD/MEDQ  D:  08/06/2008  T:  08/07/2008  Job:  [id number]

## 2010-06-23 NOTE — Op Note (Signed)
NAME:  [name], [name]             ACCOUNT NO.:  [id number]   MEDICAL RECORD NO.:  [id number]          PATIENT TYPE:  AMB   LOCATION:  DAY                           FACILITY:  [facility]   PHYSICIAN:  R. Garfield Cornea, M.D. DATE OF BIRTH:  05-Oct-1940   DATE OF PROCEDURE:  05/23/2005  DATE OF DISCHARGE:                                 OPERATIVE REPORT   PROCEDURE:  Colonoscopy with biopsy, stool collection.   INDICATIONS:  The patient is a 70 year old gentleman with recent3-week  history of bloody diarrhea.  Clostridium difficile toxincame back positive  and we treated with Flagyl with some notable improvement in symptoms.  He  still is telling me he is not back to baseline.  Still has some watery  stools.  Colonoscopy is now being done.  There is no family history of  colorectal neoplasia.  He had a colonoscopy he thinks but it has been years  ago and he does not recall any details.  Colonoscopy is now being done. This  approach has been discussed with the patient.  The potential risks, benefits  and alternatives have been reviewed, questions answered.  Please see  documentation in the medical record.   DESCRIPTION OF PROCEDURE:  Oxygen saturation, blood pressure, pulse and  respiration were monitored throughout the entire procedure.  Conscious  sedation with Versed 3 mg IV and Demerol 75 mg IV in divided doses.  The  instrument was the Olympus video chip system.   FINDINGS:  Digital rectal exam revealed no abnormalities.   ENDOSCOPIC FINDINGS:  Prep was adequate.   Rectum:  Examination of the rectal mucosa including changes consistent going  with proctitis.  There is almost complete loss a normal vascular pattern.  There are tiny erosions and marked friability in the entire rectal mucosa.  No other abnormalities were seen retroflexed.   Colon:  Colonic mucosa was surveyed from the rectosigmoid junction thru the  left, transverse,  right colon to the area of the appendiceal  orifice,  ileocecal valve and cecum.  These structures were well seen and photographed  for the record.  Terminal ileum was intubated to 10 cm.  From this level,  the scope was slowly withdrawn.  All previously mentioned mucosal surfaces  were again seen.  Inflammatory changes seen in the rectum extended up well  into the sigmoid colon to approximately 40 cm and then beyond this, the  colonic mucosa appeared entirely normal.  The terminal ileum mucosa appeared  normal.  Segmental biopsies of the sigmoid and rectal mucosa were taken to  histologic study.  Also stool residue was suctioned out for microbiology  studies.  The patient tolerated the procedure well and was reactive after  endoscopy.   IMPRESSION:  Granularity, friability erosions.  Loss of the normal vascular  pattern involving the entire rectum extending up to 40 cm as described  above.  Upstream of this, the colonic mucosa appeared normal.  Normal  terminal ileum.   Endoscopically, this looks more like idiopathic inflammatory bowel disease  (ulcerative colitis) rather than infectious process such as Clostridium  difficile, although he did have  documentation of this infection recently.   RECOMMENDATIONS:  Before embarking on therapy for inflammatory bowel  disease, we will go ahead and follow up on biopsies and repeat Clostridium  difficile stool toxin assay.  Will make further recommendations in the very  near future.  He may continue using Lomotil sparingly on p.r.n. basis.      Bridgette Habermann, M.D.  Electronically Signed     RMR/MEDQ  D:  05/23/2005  T:  05/24/2005  Job:  [id number]   cc:   Nicki Reaper A. Wolfgang Phoenix, MD  Fax: [phone]

## 2010-06-23 NOTE — Procedures (Signed)
NAME:  [name], [name]             ACCOUNT NO.:  [id number]   MEDICAL RECORD NO.:  [id number]          PATIENT TYPE:  REC   LOCATION:  RAD                           FACILITY:  [facility]   PHYSICIAN:  Leslye Peer, MD       DATE OF BIRTH:  1941/01/24   DATE OF PROCEDURE:  05/02/2005  DATE OF DISCHARGE:                                    STRESS TEST   REFERRING PHYSICIAN:  Scott A. Wolfgang Phoenix, M.D., and Quay Burow, M.D.   INDICATIONS:  A 70 year old gentleman with past medical history of  hypertension and CAD status post stenting who is referred for chest  discomfort.   ELECTROCARDIOGRAPHIC/HEMODYNAMIC DATA:  Baseline blood pressure was 122/70  mmHg with a pulse of 66 beats per minute. Baseline 12-lead EKG revealed  normal sinus rhythm with nonspecific ST changes. Dobutamine was infused per  protocol. He surpassed his target heart rate of 131 beats per minute,  proceeding to a peak heart rate of 132 beats per minute. He experienced  dizziness and chest discomfort which resolved by test end. EKG at peak heart  rate revealed sinus tachycardia with occasional PVCs. He had  pseudonormalization of his non-ST changes. There were no ischemic changes at  peak heart rate, nor did any develop during the recovery phase. Heart rate  recovered to under 100 in approximately 7 minutes.   IMPRESSION:  1.  EKG notable for occasional PVCs but no ischemic changes.  2.  Scintigraphic images are pending.           ______________________________  Leslye Peer, MD     AB/MEDQ  D:  05/02/2005  T:  05/03/2005  Job:  [id number]   cc:   Nicki Reaper A. Wolfgang Phoenix, MD  Fax: [phone]   Quay Burow, M.D.  Fax: [phone]

## 2010-06-23 NOTE — Op Note (Signed)
[facility]  Patient:    [name], [name] Visit Number: [id number] MRN: [id number]          Service Type: SUR Location: 4W [id number] [id number] Attending Physician:  Tye Savoy Proc. Date: 11/13/00 Admit Date:  11/13/2000                             Operative Report  PREOPERATIVE DIAGNOSIS:  End-stage osteoarthritis of the left knee.  POSTOPERATIVE DIAGNOSIS:  End-stage osteoarthritis of the left knee.  PROCEDURE:  Left total knee arthroplasty.  SURGEON:  Johnn Hai, M.D.  ASSISTANT:  Starling Manns, M.D.  ANESTHESIA:  General.  BRIEF HISTORY AND INDICATION:  A 70 year old with end-stage osteoarthrosis of the left knee, tricompartmental, refractory to conservative treatment. Operative intervention was indicated for replacement of degenerated joint. Risks and benefits discussed including bleeding, infection, damage to vascular structures, component loosening, need for revision, DVT, PE, etc.  DESCRIPTION OF PROCEDURE:  The patient is in supine position.  After the induction of adequate general anesthesia, 1 gram of Kefzol, the left lower extremity was prepped and draped and exsanguinated in the usual sterile fashion.  Thigh tourniquet inflated to 350 mmHg.  A standard anterior approach to the knee was performed.  Subcutaneous tissue was dissected.  Electrocautery was utilized to achieve hemostasis.  A median parapatellar arthrotomy was performed with release of the superficial portion of the medial collateral ligament.  The meniscal and tibial ligament was released as well.  The knee was flexed after the patella everted, and tricompartmental osteoarthrosis, severe, was noted.  ACL remnant was removed.  PCL was found to be viable and intact.  Remnants of the lateral and medial meniscus were removed.  A step drill was utilized to enter the femur.  It was irrigated, evacuated, 5 degree left, transection of the femur, 10 mm cut off the distal  end of the femur. Following this, a sizing trial was then placed, sized optimally to a 13.  This was then marked in the appropriate external rotation.  The distal femoral cutting jig was then applied.  Anterior, posterior, as well as chamfer cuts were performed.  Soft tissue was well-protected.  Next, the tibia was subluxed.  Oscillating saw utilized to remove the tibial eminence.  The PCL was recessed as well.  The femur again sized to a 13 and the tibia sized to a 13 as well.  A bushing attached, and the tibia was entered and irrigated. Intramedullary guide placed.  External alignment guide placed in the appropriate rotation, bisecting the malleolus.  This was pinned at 4 mm from the medial defect.  Oscillating saw utilized to perform a proximal tibial cut 5 degree slope, soft tissue was well-protected.  Next, trial femur and tibia was then placed with 10 mm insert, full extension, 140 flexion without lift-off.  Appropriate tracking.  External alignment guide placed, bisecting the malleoli, marked in rotation of the tibia.  The tibia was subluxed, pins removed, and the tibial punch guide applied, secured, and two punches for the 13 were utilized.  Next, the patella was everted, sized to a 28, patellar clamp applied, reamed 10 mm.  Pegs were accepted.  Trial placed, and the redundant patella was then reamed with a reamer.  Next, all trials were then removed.  The flexion extension gap was symmetrical as was stability and varus valgus stressing at 0 and 30 degrees.  All trials were removed and pulsatile lavage  utilized to lavage the joint.  Tibia was then subluxed and all bony surfaces dried.  A 13 tray was then cemented in the proximal tibia after appropriate mixing of the cement.  Redundant cement was then removed.  The femur was then impacted to the femur, redundant cement removed, 13 was utilized.  ______ insert was applied.  Knee was then held in extension. Redundant cement removed.   Axial load applied to the extremity.  Patella was then cemented, a clamp applied, and redundant cement removed after curing of the cement.  Trial was removed.  The joint inspected, redundant cement removed from the tibia, femur, and removed from the patella.  Trial placement, and 10 seemed to be optimal with 0 extension and 40 degrees of flexion.  Permanent tibial polyethylene insert 10 was utilized after a full inspection of the tibia subluxed.  Again it was reduced and found to stable throughout a full range.  Normal varus valgus stability a 0 and 30 degrees.  Some slight lateral tracking of the patella and a small lateral retinaculum release was performed. Knee was then copiously irrigated with antibiotic irrigation.  The genicular vessels posteriorly had been electrocauterized.  A Hemovac was placed and brought out through a lateral stab wound of the skin.  Marcaine 0.25% and epinephrine was infiltrated in the joint.  Patellar arthrotomy repaired with #1 Vicryl interrupted figure-of-eight sutures.  Subcutaneous tissue reapproximated with 0-2 Vicryl simple sutures.  The skin was reapproximated with staples.  The wound was dressed sterilely.  Compression dressing applied. Tourniquet was deflated with adequate revascularization in the lower extremity appreciated.  The patient tolerated the procedure well without complications. Attending Physician:  Tye Savoy DD:  11/13/00 TD:  11/13/00 Job: [id number]

## 2010-06-23 NOTE — H&P (Signed)
NAME:  [name], FOOT             ACCOUNT NO.:  [id number]   MEDICAL RECORD NO.:  [id number]          PATIENT TYPE:  INP   LOCATION:  A220                          FACILITY:  [facility]   PHYSICIAN:  Scott A. Wolfgang Phoenix, MD    DATE OF BIRTH:  25-Mar-1940   DATE OF ADMISSION:  03/29/2005  DATE OF DISCHARGE:  LH                                HISTORY & PHYSICAL   CHIEF COMPLAINT:  Shortness of breath.   HISTORY OF PRESENT ILLNESS:  This is a 70 year old white male with known  coronary artery disease who awoke from his sleep feeling acutely short of  breath.  He felt like he could not get a good deep breath and felt short of  breath.  He denied breaking out in a sweat and denied chest pain or pain and  just felt very short of breath.  He says he tried to move around to see if  that would make it go away, and it did not.  He continued to feel short of  breath and he presented to the emergency department, and he was treated.  In  the emergency department, he states his breathing started getting back to  normal by the time he arrived at the emergency department.  Initial enzymes  were negative and chest x-ray was negative but D. dimer was elevated and a  CT scan was done, but they could not totally exclude pulmonary embolism  because of a technique used.  But there was no sign of major embolism.   PAST MEDICAL HISTORY:  1.  HTN.  2.  Sleep apnea.  3.  Coronary artery disease.  4.  Reflux.   MEDICATIONS:  1.  Cardura 4 mg q.h.s.  2.  Norvasc 5 mg daily.  3.  Pravachol 40 mg daily.  4.  Diovan 80 mg daily.  5.  Toprol 150 mg XL daily.  6.  Aspirin 81 mg daily.  7.  Prevacid daily.   FAMILY HISTORY:  Coronary artery disease, blood clots, cancer.   REVIEW OF SYSTEMS:  See per above.   PHYSICAL EXAMINATION:  GENERAL APPEARANCE:  NAD.  HEENT:  Benign.  __________  CHEST:  CTA.  HEART:  Regular.  ABDOMEN:  Soft, obese.  EXTREMITIES:  Trace edema.   STUDIES:  EKG:  No acute changes.   LABORATORY DATA:  Overall, PO2 67, O2 saturation 95% on room air.  Also, D.  dimer mildly elevated at 0.99.  CT of the chest was negative pulmonary  embolus, but could not exclude possibility of small emboli.   ASSESSMENT/PLAN:  Shortness of breath.  Serial enzymes to rule out  myocardial infarction.  Also, go forward with repeat CT scan.  I talked with  cardiologists.  They could not totally exclude the possibility of a small  pulmonary embolus, and I just felt it was best to repeat CT scan.  In  addition to this, the patient needs to be monitored closely.  May well  need  to go to [facility] for catheterization depending on enzymes and clinical  course.      Scott A.  Wolfgang Phoenix, MD  Electronically Signed     SAL/MEDQ  D:  03/30/2005  T:  03/30/2005  Job:  [id number]

## 2010-06-23 NOTE — Discharge Summary (Signed)
NAME:  [name], [name]             ACCOUNT NO.:  [id number]   MEDICAL RECORD NO.:  [id number]          PATIENT TYPE:  INP   LOCATION:  A220                          FACILITY:  [facility]   PHYSICIAN:  Scott A. Wolfgang Phoenix, MD    DATE OF BIRTH:  [birth year]   DATE OF ADMISSION:  03/29/2005  DATE OF DISCHARGE:  02/23/2007LH                                 DISCHARGE SUMMARY   DISCHARGE DIAGNOSES:  1.  Dyspnea on exertion.  2.  No pulmonary embolus.   HOSPITAL COURSE:  The patient's enzymes are negative.  CT scan negative for  a PE.  The patient overall was feeling better and breathing better.  He did  have some increased fluids when he came in and we did IV Lasix.  The patient  diuresed and did better.  He actually was not having any shortness of  breath.  D-dimer was okay and echocardiogram showed good ejection fraction  at 60-70% with a little bit of left ventricular hypertrophy.  His vital  signs are stable at the time of discharge.   FOLLOW UP:  He is instructed to follow up with [place] in about 1 week's time.  Follow up with [facility] in the next 1-2 weeks.      Scott A. Wolfgang Phoenix, MD  Electronically Signed     SAL/MEDQ  D:  04/24/2005  T:  04/24/2005  Job:  [id number]

## 2010-06-23 NOTE — Discharge Summary (Signed)
[name], ROOSEVELT             ACCOUNT NO.:  [id number]   MEDICAL RECORD NO.:  [id number]          PATIENT TYPE:  INP   LOCATION:  1505                         FACILITY:  [facility]   PHYSICIAN:  Susa Day, M.D.    DATE OF BIRTH:  1940/04/24   DATE OF ADMISSION:  05/15/2006  DATE OF DISCHARGE:  05/20/2006                               DISCHARGE SUMMARY   ADMISSION DIAGNOSES:  1. Degenerative joint disease right knee.  2. Hypertension.  3. Gastroesophageal reflux disease.  4. Hypercholesterolemia.  5. Benign prostatic hypertrophy.  6. Sleep apnea.   DISCHARGE DIAGNOSES:  1. Degenerative joint disease right knee.  2. Hypertension.  3. Gastroesophageal reflux disease.  4. Hypercholesterolemia.  5. Benign prostatic hypertrophy.  6. Sleep apnea.  7. Status post right total knee arthroplasty.  8. Asymptomatic postoperative anemia.  9. Fever of unknown origin, resolved hypokalemia.   PROCEDURES:  The patient was taken to the OR on May 25, 2006, to  undergo a right total knee arthroplasty.  Surgeon, Dr. Susa Day,  assistant Rometta Emery, PA-C, anesthesia general, and complications  none.   CONSULTS:  1. PT/OT.  2. Hospitalists.   HISTORY:  [name] is a pleasant 70 year old gentleman with a  longstanding history of right knee pain.  He had undergone many years of  conservative treatment including corticosteroid therapy as well as  activity modification but unfortunately has had persistent pain.  He  describes his pain to be disabling at this time.  He would like to  proceed with a total knee arthroplasty.  X-rays do reveal end-stage  osteoarthritis of the knee.  It is felt he would benefit from a knee  replacement.  Risks and benefits were discussed.  Preoperative clearance  was obtained by Dr. Wolfgang Phoenix as well as Dr. Gwenlyn Found.  We did elect to  proceed.   HOSPITAL COURSE:  The patient was admitted, taken to the OR, and  underwent the above-stated procedure  without significant difficulty.  He  was transferred to the PACU and then to the orthopedic floor to continue  postoperative care.  Hemovac drain was placed intraoperatively.  He was  placed on a PCA pump for pain control.  Postoperatively, the patient did  fairly well.  He did note some pain control issues.  Medications were  adjusted.  Postoperatively, he did have some low-grade temps which were  managed initially with incentive spirometer as well as deep breathing  and coughing.  Postoperative day #2, dressing was changed.  Incision was  clean, dry, and intact.  Hemovac was removed with tip intact.  Hemoglobin remained stable.  He was on Coumadin for DVT prophylaxis.  He  did have mild hypokalemia which was monitored.  The patient continued to  have fairly significant pain control issues; however, PCA was  discontinued.  This was managed with p.o. analgesics.  The patient noted  slow progress with physical therapy.  Discharge planning was initiated.  It was felt he would need placement into a rehab or nursing home  facility for a short period of time to become independent with his ADLs.  Over the  weekend, the patient had persistent temperature with T-max  being 101.6.  Hospitalist was consulted for workup.  The patient had a  urinalysis which was negative.  Blood cultures were negative as well.  Chest x-ray did repair bronchial thickening likely related to chronic  bronchitis or smoking.  No acute findings were noted.  Postoperative day  #5, the patient's vital signs are stable.  He was afebrile.  Pain was  fairly well controlled.  He was voiding without difficulty.  Hemoglobin  was stable at 9.6, hematocrit 27.4.  He was becoming therapeutic with  INR of 1.6, and it was felt at this point the patient was stable to be  discharged to skilled nursing facility of choice.  A bed became  available.  Therefore, arrangements have been made.   DISPOSITION:  The patient discharged to a skilled  nursing facility for a  short stay at rehab.  The patient will follow up with Dr. Tonita Cong in  approximately 10-14 days for staple removal as well as x-rays.   ACTIVITY:  He is to ambulate utilizing his knee immobilizer until he can  straight leg raise x10.  Otherwise, he can have this off while in bed.  He should elevate and ice 6 times a day for 20 minutes at a time for  edema control.  Continue with TED stockings.  The patient can be  weightbearing as tolerated with knee immobilizer and walker.  Total knee  precautions should be initiated.   DIET:  As tolerated.   DISCHARGE MEDICATIONS:  1. Pravastatin 40 mg 1 p.o. q.h.s.  2. Furosemide 40 mg 1/2 tab p.o. q.a.m.  3. Doxazosin 8 mg 1/2 tab p.o. q.a.m.  4. Metoprolol 50 mg 3 tabs p.o. q.h.s.  5. Amlodipine 5 mg 1 p.o. q.a.m.  6. Diovan 80 mg 1 p.o. q.h.s.  7. Nitroglycerin sublingual p.r.n.  8. Coumadin as dosed per pharmacy.  9. Vicodin 1-2 p.o. q.4-6h. p.r.n. pain.  10.Robaxin 500 mg 1 p.o. q.8h. p.r.n. spasm.  11.Colace 100 mg 1 p.o. b.i.d.   DISCHARGE INSTRUCTIONS:  1. The patient should follow up with Susa Day, M.D. in 10-14      days, should call the office for an appointment.  2. She can change her dressing daily.  3. She may shower.   CONDITION ON DISCHARGE:  Stable.   FINAL DIAGNOSIS:  Status post right total knee arthroplasty.      Rometta Emery, P.A.      Susa Day, M.D.  Electronically Signed    CS/MEDQ  D:  05/20/2006  T:  05/20/2006  Job:  [id number]

## 2010-06-23 NOTE — Op Note (Signed)
NAMECLOYS, VERA             ACCOUNT NO.:  [id number]   MEDICAL RECORD NO.:  [id number]          PATIENT TYPE:  INP   LOCATION:  X001                         FACILITY:  [facility]   PHYSICIAN:  Susa Day, M.D.    DATE OF BIRTH:  09-Jan-1941   DATE OF PROCEDURE:  05/15/2006  DATE OF DISCHARGE:                               OPERATIVE REPORT   PREOPERATIVE DIAGNOSIS:  Degenerative joint disease of the right knee.   POSTOPERATIVE DIAGNOSIS:  Degenerative joint disease of the right knee.   PROCEDURE PERFORMED:  Right total knee arthroplasty.   SURGEON:  Susa Day, M.D.   ASSISTANT:  Rometta Emery, P.A.   ANESTHESIA:  General.   BRIEF HISTORY AND INDICATIONS:  This is a 70 year old with end-stage  osteoarthritis of the right knee and some varus deformity noted,  indicated for total knee replacement.  Risks and benefits were discussed  including bleeding, infection, injury to neurovascular structures,  suboptimal range of motion, arthrofibrosis, DVT and anesthetic  complications, etc.   DESCRIPTION OF PROCEDURE:  The patient was placed in a supine position  and after the induction of adequate general anesthesia and 2 g of  Kefzol, the right lower extremity was prepped and draped and  exsanguinated in the usual sterile fashion.  The thigh tourniquet was  inflated to 350 mmHg.  A midline anterior knee incision was made and the  subcutaneous tissue was dissected and electrocautery was utilized to  achieve hemostasis.  A full-thickness flap was performed.  A medial  parapatellar arthrotomy was performed.  Copious joint fluid was  evacuated and tricompartmental osteoarthrosis was noted; there was  normal joint fluid.  Osteophytes were removed with a rongeur.  A fairly  tight knee was encountered.  A step drill was utilized to enter the  femoral canal and was irrigated.  A 5-degree left was placed, 11 off the  distal femur, pinned, cut anteriorly and posteriorly, but did not  notch  the femur.  Distal femoral jig was applied; anterior, posterior and  chamfer cuts were then performed with soft tissues well-protected.  We  turned our attention then to the tibia and the tibia was subluxed.  The  PCL was then recessed and osteophytes were removed with a Estill Cotta rongeur  off the medial tibial plateau and lateral tibial plateau.  We had  performed a soft tissue release medially utilizing a curved Krego in the  appropriate fashion.  We subluxed the tibia using an external alignment  jig, bisecting the medial malleoli medial to the tibial tubercle and we  were to take 10 off the high side, which was actually at a point  laterally.  The tibial cut was then performed with an oscillating saw.  His tibial spacer, however, identified a fairly tight compartment  medially.  We then removed the spacer and performed additional soft  tissue releases, a more extensive medial release and removal of the  osteophytes from the posterior aspect of the tibia.  I then put a trial  femoral component into place and removed redundant osteophytes off the  posterolateral aspect of the femur with an osteotome,  protecting the  capsule with a curved Krego.  Even after all of these releases, there  was still tightness medially and we therefore had to resect more of the  tibia as he was tight in flexion and in extension.  We decided to take 2  additional millimeters off the medial tibial plateau and reapplied our  external alignment jig and repinned the proximal tibia and removed an  additional 2 millimeters, also taking a bit more off the medial tibial  plateau.  The spacer then was in satisfactory in both flexion and  extension and it was stable with varus/valgus stressing at 0 and 30  degrees.  The patella was measured at 26.  For his large size, he  required 11-mm of resection and a 40 patella.  I then reamed the patella  after applying the clamp.  I placed the template and drilled the 3 peg   holes just so it was more medialized.  I then placed a trial femur and  trial tibia and in full extension and full flexion, no evidence of  instability with varus and valgus stressing and felt to be satisfactory.  We then finished the preparation of the tibia.  It also was noted that  at preparing the femur, we performed the box cut with the femur as well,  protecting the soft tissues at all times.  The tibia was subluxed and  base plate applied, which was a 6; we had a 6 femur as well.  The distal  femoral jig initially applied dictated a 6 femur for [place] and the tibia  dictated that as well for full coverage medial to the tibial tubercle.  We then placed our pins and performed the central reaming and then the  thins.  We removed any posterior osteophytes and rongeurs.  The residual  of the menisci had been removed as well.  We cauterized the geniculates.  I felt we had performed appropriate release and we removed all  osteophytes.  He had interestingly a big osteophyte off the tibia  posteromedially; it was a large round osteophytes, though connected to  the tibia; this required some protection of the soft tissue dissection  and removal with a Estill Cotta rongeur.  There was good patellofemoral tracking  with the trials as well.  All instrumentation was removed.  We used the  pulsatile lavage to clean all surfaces, then flexed the knee, applied  our McHales, subluxed the tibia, dried all surfaces, mixed the cement in  the appropriate fashion, injected it upon the tibia, impacted the 6  tibia, the 6 femur, inserted a 10 trial and clamped a 40 patella until  the cement cured and removed the redundant cement prior to and then  after the curing both in the posterior aspect of the femur and sides of  the tibia.  Wound was copiously irrigated.  We trialed with a 10, full  extension and full flexion, no instability with varus or valgus  stressing at 0 and 30 degrees.  This was then removed and a  permanent 10 was then implanted after copious irrigation and final inspection  revealing no evidence of soft tissue impingement or residual osteophytes  or cement, again full flexion and extension, no instability.  Wound was  then copiously irrigated.  The knee was placed in slight flexion and a  Hemovac was placed and brought out through a lateral stab wound in the  skin.  Patellar arthrotomy was repaired with #1 Vicryl in interrupted  figure-of-eight sutures.  Subcutaneous tissue was reapproximated with 0  and 2-0 Vicryl simple interrupted sutures placed.  Marcaine 0.25% with  epinephrine was infiltrated into the wound.  The knee was closed in  slight flexion and then following that, I flexed it to 140 degrees and  full extension with no instability.  Tourniquet was deflated at just  over 2 hours.  A sterile dressing was applied and there was adequate  revascularization of the lower extremity appreciated.   The patient tolerated the procedure well with no complications at just  over 2 hours.  There was extensive tightness of the knee and requirement  for the bony and soft tissue release.      Susa Day, M.D.  Electronically Signed     JB/MEDQ  D:  05/15/2006  T:  05/15/2006  Job:  [id number]

## 2010-06-23 NOTE — Discharge Summary (Signed)
[facility]  Patient:    [name] Visit Number: [id number] MRN: [id number]          Service Type: SUR Location: 4W 8299 [id number] Attending Physician:  Tye Savoy Dictated by:   Tillie Rung, P.A.-C. Admit Date:  11/13/2000 Discharge Date: 11/18/2000   CC:         Calton Golds, M.D.  Lorretta Harp, M.D.   Discharge Summary  PRINCIPAL DIAGNOSIS:  Degenerative joint disease of the left knee, status post left total knee arthroplasty.  SECONDARY DIAGNOSES: 1. Coronary artery disease, status post cardiac stent. 2. Hiatal hernia and gastroesophageal reflux. 3. Hypertension. 4. Sleep apnea. 5. Hyperlipidemia. 6. History of kidney stones. 7. Obesity.  PROCEDURE:  Left total knee arthroplasty by Dr. Tonita Cong with the assistance of Dr. Patrice Paradise on November 13, 2000.  Please see operative summary for further details.  CONSULTATIONS:  None.  LABORATORY DATA:  Preoperative hemoglobin and hematocrit were 14.7 and 42.8, respectively.  This reached a low on November 16, 2000, of 10.8 and 31.0, respectively.  Preoperative PT and INR 13.0 and 1.0.  Prior to discharge, he was up to 19.0 and 1.9, respectively.  Preoperative chemistry just shows slightly elevated glucose of 136.  The remainder of his parameters were within normal limits with a BUN of 14, creatinine 1.1, and a sodium and potassium of 143 and 3.8, respectively.  These all remained stable postoperatively as well. Urinalysis was all negative and within normal limits preoperatively.  Chest x-ray on November 15, 2000, showed chronic obstructive pulmonary disease with areas of atelectasis and scarring.  EKG from November 2001, showed normal sinus rhythm with no acute ST changes.  CHIEF COMPLAINT:  Left knee pain.  HISTORY OF PRESENT ILLNESS:  [name] is a 70 year old male who presented to the [facility] with complaints of persistent left knee pain,  swelling, and giving way episodes.  This has been present for quite some time, but has worsened since an injury in March 2002.  He has tried cortisone injections without much improvement.  He also underwent a knee arthroscopy and debridement with just brief and minimal relief.  Given the persistent nature of his symptoms and interference with his activities of daily living, he elected to undergo surgical intervention.  Prior to surgery received preoperative medical clearance from Dr. Sallee Lange.  The patient had also undergone a Cardiolite study by Dr. Quay Burow, who felt the patient was at low preoperative cardiovascular risk.  HOSPITAL COURSE:  Following the surgical procedure, the patient was taken to the PACU in stable condition, transferred to the orthopedic floor in good condition.  He did well during his postoperative stay.  He was on routine total knee protocol.  He was started on Coumadin for deep venous thrombosis prophylaxis, and his INR reached 1.9 prior to discharge.  His hemoglobin and hematocrit remained stable postoperatively, and the patient did not require any blood transfusions.  The patient does have a history of sleep apnea, and on postoperative day #2, was noted to have some desaturation down to approximately 90% on 6 L of oxygen.  Chest x-ray was ordered which showed chronic obstructive pulmonary disease and some atelectasis, but no evidence of an infiltrate.  We ordered a CPAP for the patient, but he did not wish to use this.  He had no further problems with his respiratory status.  He progressed well with physical therapy, weightbearing as tolerated.  CPM machine, tolerating this well.  He initially received  morphine via PCA, which controlled his pain well.  This was then switched over to p.o. Vicodin which he tolerated well and also controlled his pain well.  By postoperative day #5, he had progressed well with physical therapy, was medically and  orthopedically stable, and ready for discharge home.  DISPOSITION:  The patient will be discharged home with [facility].  DIET:  Cardiac, low sodium.  FOLLOWUP:  Dr. Tonita Cong in approximately 9 days.  He is to call [phone] for an appointment.  WOUND CARE:  Once daily dressing changes to the knee.  Okay to begin showering.  ACTIVITY:  Weightbearing as tolerated.  Total knee protocol.  DISCHARGE MEDICATIONS:  1. Coumadin per pharmacy protocol x 4 weeks postoperative.  2. Trinsicon one p.o. t.i.d. p.c.  3. Vitamin C 500 mg one p.o. q.d.  4. Vicodin 5/500 mg one or two p.o. q.4-6h. p.r.n. pain.  5. Robaxin 500 mg p.o. q.8h. p.r.n. spasm.  6. Cardura 8 mg 1/2 tablet p.o. q.d.  7. Norvasc 5 mg p.o. q.d.  8. Pravachol 20 mg p.o. q.d.  9. Metoprolol 75 mg p.o. b.i.d. 10. Diovan 80 mg p.o. q.d.  CONDITION ON DISCHARGE:  Good and improved. Dictated by:   Tillie Rung, P.A.-C. Attending Physician:  Tye Savoy DD:  11/18/00 TD:  11/18/00 Job: [id number]

## 2010-06-23 NOTE — Group Therapy Note (Signed)
NAME:  [name], [name]             ACCOUNT NO.:  [id number]   MEDICAL RECORD NO.:  [id number]          PATIENT TYPE:  INP   LOCATION:  A220                          FACILITY:  [facility]   PHYSICIAN:  Scott A. Wolfgang Phoenix, MD    DATE OF BIRTH:  Apr 25, 1940   DATE OF PROCEDURE:  DATE OF DISCHARGE:                                   PROGRESS NOTE   The patient has been breathing well.  Denies any chest tightness, pressure,  pain.   Blood work overall looks good.  Enzymes through the night negative.   Vital signs stable.   EXAMINATION:  LUNGS:  Clear.  HEART:  Regular.  Pulse is normal.   Repeat of CT of the chest is today.  Echo is today.  Await the findings of  this.  Still may need catheterization.      Scott A. Wolfgang Phoenix, MD  Electronically Signed     SAL/MEDQ  D:  03/30/2005  T:  03/30/2005  Job:  [id number]

## 2010-06-23 NOTE — H&P (Signed)
[facility]  Patient:    NILSON, TABORA Visit Number: [id number] MRN: [id number]          Service Type: Attending:  Johnn Hai, M.D. Dictated by:   Duncan Dull Troncale, P.A.-C. Adm. Date:  11/13/00   CC:         Sallee Lange, M.D.  Lorretta Harp, M.D.   History and Physical  DATE OF BIRTH:  [birth year]  CHIEF COMPLAINT:  Left knee pain.  HISTORY OF PRESENT ILLNESS:  Toa Mia is a 70 year old male who presents with complaints of persistent left knee pain, swelling, and giving way episodes.  This has been present for some time, but has worsened since an injury that he sustained at church in March of 2002.  He had tried a cortisone injection afterwards without much improvement.  He underwent knee arthroscopy and debridement.  He was noted to have grade 4 chondromalacia in the knee.  He had minimal relief after his knee arthroscopy.  The pain and swelling have persisted and are now interfering with his activities of daily living. Because of the persistent pain and the findings on clinical and radiographic exams, it is recommended that he may benefit from surgical intervention.  He is now at the point that he does wish to proceed with surgery.  REVIEW OF SYSTEMS:  Denies any diplopia, blurred vision, or headaches.  No recent fever or chills.  No earache, sore throat, or rhinorrhea.  He does report a history of shortness of breath and dyspnea on exertion.  No chest pain.  No cough.  No abdominal pain, nausea, vomiting, diarrhea, or constipation.  No melena or bright red blood per rectum.  No urinary frequency, hematuria, or dysuria.  No numbness or tingling in the extremities.  PAST MEDICAL HISTORY:  Significant for coronary artery disease, hiatal hernia, gastroesophageal reflux, hypertension, sleep apnea, hyperlipidemia, and kidney stones, as well as obesity.  Denies a history of stroke, seizure, cancer, diabetes, or lung  disease.  PAST SURGICAL HISTORY:  Left knee arthroscopy in March of 2002, bilateral rotator cuff repairs, cardiac stent in 1996, and foot surgery.  FAMILY HISTORY:  He has 12 siblings, all of whom had coronary artery disease, but they all are alive with the exception of one sister who died with cancer. Her father had blood clots.  His mother had cancer.  ALLERGIES:  No known drug allergies.  CURRENT MEDICATIONS: 1. Aspirin 325 mg b.i.d.  He will be stopping before surgery. 2. Cardura 8 mg half of a tablet q.d. 3. Norvasc 5 mg q.d. 4. Pravachol 20 mg q.d. 5. Metoprolol 75 mg b.i.d. 6. Flexeril 10 mg t.i.d. 7. Diovan 80 mg q.d.  SOCIAL HISTORY:  He is retired.  He is married.  He has five children who are alive and well.  Denies alcohol or tobacco use.  Sallee Lange, M.D., is his family practitioner and Lorretta Harp, M.D., is his cardiologist.  PHYSICAL EXAMINATION:  VITAL SIGNS:  Pulse 88, respiratory rate 22 and unlabored, blood pressure 120/70.  GENERAL APPEARANCE:  This is a well-developed, well-nourished male in no acute distress.  Mildly obese in appearance.  HEENT:  The head is normocephalic and atraumatic.  The pupils are equal, round, and reactive to light.  Extraocular movements are grossly intact. Oropharynx clear without redness, exudates, or lesions.  NECK:  Supple with no cervical lymphadenopathy.  No carotid bruits.  CHEST:  Clear to auscultation bilaterally.  No wheezes or crackles.  HEART:  Regular rate  and rhythm.  There are no murmurs, rubs, or gallops.  ABDOMEN:  Soft and obese.  Nontender and nondistended with no masses and no hepatosplenomegaly.  BREASTS:  Not examined; not pertinent to present illness.  GENITOURINARY:  Not examined; not pertinent to present illness.  SKIN:  Intact.  EXTREMITIES:  2+ DP pulses.  NEUROLOGIC:  Motor and sensory are grossly intact.  EXTREMITIES:  The left knee is with trace effusion.  He has no  instability. Range of motion negative 5-130 degrees.  Tender over the lateral joint line and medical joint line.  X-rays show medial compartment end-stage osteoarthritis of the left knee.  IMPRESSION: 1. Degenerative joint disease of left knee. 2. Coronary artery disease, status post cardiac stent. 3. Hiatal hernia and gastroesophageal reflux. 4. Hypertension. 5. Sleep apnea. 6. Hyperlipidemia. 7. History of kidney stones. 8. Obesity.  PLAN:  The patient will be admitted to [facility] to undergo left total knee arthroplasty by Johnn Hai, M.D., on November 13, 2000.  We have received preoperative clearance from Sallee Lange, M.D.  The patient underwent a Cardiolite study by Lorretta Harp, M.D., who felt he was at low preoperative cardiovascular risk.  Signed surgical consent and preoperative labs will be obtained.  All questions have been encouraged and answered. Dictated by:   Duncan Dull Troncale, P.A.-C. Attending:  Johnn Hai, M.D. DD:  11/06/00 TD:  11/06/00 Job: [id number]

## 2010-06-23 NOTE — H&P (Signed)
[name], STIGGER             ACCOUNT NO.:  [id number]   MEDICAL RECORD NO.:  [id number]          PATIENT TYPE:  INP   LOCATION:  NA                           FACILITY:  [facility]   PHYSICIAN:  Susa Day, M.D.    DATE OF BIRTH:  02/23/1940   DATE OF ADMISSION:  05/15/2006  DATE OF DISCHARGE:                              HISTORY & PHYSICAL   CHIEF COMPLAINT:  Right knee pain.   HISTORY:  Mr. Henricksen is a pleasant 70 year old gentleman with a long-  standing history of right knee pain.  He has undergone conservative  treatment including corticosteroid therapy as well as activity  modification with persistent pain.  He has now gotten to the point where  his symptoms are very disabling.  He would like to proceed with a total  knee arthroplasty and films do reveal end-stage osteoarthrosis of the  right knee.  It is felt he would benefit from a total knee arthroplasty.  He did undergo preoperative clearance by Dr. Wolfgang Phoenix and Dr. Gwenlyn Found.  Risks and benefits of the surgery were discussed with the patient.  He  does elect to proceed.   MEDICAL HISTORY:  Hypertension, gastroesophageal reflux disease,  hypercholesterolemia, BPH, sleep apnea.   CURRENT MEDICATIONS:  1. Pravastatin 40 mg one p.o. daily.  2. Lasix 40 mg one p.o. daily.  3. Metoprolol 50 mg three p.o. at bedtime.  4. Amlodipine besylate 5 mg one p.o. at bedtime.  5. Doxazosin mesylate 8 mg one p.o. at bedtime.  6. Diovan 80 mg one p.o. at bedtime.  7. Aspirin 81 mg two tabs daily.   ALLERGIES:  NONE.   SURGICAL HISTORY:  The patient did have a left total knee arthroplasty  done, rotator cuff repair, neuroma removed from left foot, amputation  right toe secondary to trauma, nasal surgery.   SOCIAL HISTORY:  The patient is widowed.  He is retired.  He denies any  alcohol or tobacco consumption.  He has three children.  Primary care  physician: Dr. Wolfgang Phoenix.  Cardiologist: Dr. Gwenlyn Found.   FAMILY HISTORY:  Coronary artery  disease, hypertension, cancer.   REVIEW OF SYSTEMS:  GENERAL: The patient denies any fever, chills, night  sweats or bleeding tendencies.  CNS: No blur, double vision, seizure,  headache or paralysis.  RESPIRATORY: No shortness of breath, productive  cough or hemoptysis.  CARDIOVASCULAR: No angina or chest pain.  GU: No  dysuria, hematuria or discharge.  GI: No nausea, vomiting, diarrhea,  constipation, melena or bloody stools.  MUSCULOSKELETAL: As per HPI.   PHYSICAL EXAMINATION:  VITAL SIGNS:  Pulse is 70, respiratory rate 16,  BP 130/78.  GENERAL:  This is a well-developed, nourished gentleman sitting upright  in no acute distress.  He does walk with an antalgic gait.  HEENT:  Atraumatic, normocephalic.  Pupils equal, round, reactive to  light.  EOMs intact.  NECK:  Supple with no lymphadenopathy.  CHEST:  Clear to auscultation bilaterally.  No rhonchi, wheezes, or  rales.  BREASTS/GENITOURINARY:  Not examined, not pertinent to HPI.  HEART:  Regular rate and rhythm without murmurs, gallops or rubs.  ABDOMEN:  Soft, nontender, nondistended, bowel sounds x4.  SKIN:  No rashes or lesions are noted.  KNEE:  He is tender along the medial joint line on the right knee, there  is trace effusion, range of motion 0-130, calf soft/nontender.   IMPRESSION:  End-stage osteoarthrosis right knee.   PLAN:  The patient admitted to [facility] to undergo a right  total knee arthroplasty.  Again, we did attain the medical clearance  from Dr. Wolfgang Phoenix as well as Dr. Gwenlyn Found.      Rometta Emery, P.A.      Susa Day, M.D.  Electronically Signed    CS/MEDQ  D:  05/13/2006  T:  05/13/2006  Job:  [id number]

## 2010-06-23 NOTE — Consult Note (Signed)
NAME:  [name], [name]             ACCOUNT NO.:  [id number]   MEDICAL RECORD NO.:  [id number]          PATIENT TYPE:  AMB   LOCATION:                                FACILITY:  [facility]   PHYSICIAN:  R. Garfield Cornea, M.D. DATE OF BIRTH:  03-16-1940   DATE OF CONSULTATION:  05/15/2005  DATE OF DISCHARGE:                                   CONSULTATION   CHIEF COMPLAINT:  Bloody diarrhea.   PHYSICIAN REQUESTING THE CONSULTATION:  Scott A. Wolfgang Phoenix, M.D.   HISTORY OF PRESENT ILLNESS:  Mr. Giambalvo is a 70 year old Caucasian  gentleman patient of Dr. Sallee Lange who presents for further evaluation of  a 3-week history of bloody diarrhea.  Dr. Wolfgang Phoenix spoke with me last  Wednesday about the patient.  At that time, stool studies were pending.  He  started empiric Flagyl 500 mg t.i.d. for 7 days.  The patient was also given  Lomotil.  At that point he had already had diarrhea for 2 or 3 weeks.  He  had been in the hospital in February with shortness of breath.  He  potentially received antibiotics at that time, although the patient does not  know for sure.  He is having anywhere from five to ten stools a day.  Most  of them occur postprandially.  Sometimes he has some fresh blood on the  toilet tissue.  Denies any abdominal pain, nausea, vomiting, fever.  He has  a history of gastroesophageal reflux disease well controlled on Prevacid.  His appetite has been good.  He says since Saturday he has now been having  regular bowel movements.  Stools are now solid.  I called over and got  results of stool studies that were available.  His C. difficile turned out  to be positive.  Stool culture is pending.  No fecal wbc's seen.  His  hemoglobin was normal when checked in the office last week.  Overall, he is  feeling much better.  The patient cannot recall when his last colonoscopy  was.  He believes it has been in the remote past.   CURRENT MEDICATIONS:  1.  Cardura 4 mg daily.  2.  Pravachol 40 mg  daily.  3.  Diovan 80 mg daily.  4.  Norvasc 5 mg daily.  5.  Metoprolol 50 mg t.i.d.  6.  Prevacid 30 mg daily.  7.  Lasix 20 mg daily p.r.n.  8.  Aspirin 81 mg two daily.  9.  Lomotil p.r.n.  10. Flagyl 500 mg t.i.d. for 7 days.   ALLERGIES:  No known drug allergies.   PAST MEDICAL HISTORY:  1.  Hypertension.  2.  Gastroesophageal reflux disease.  3.  Coronary artery disease status post stenting in 1996.  4.  Sleep apnea, uses CPAP.  Also had palate surgery in the past.  5.  Osteoarthritis status post left knee replacement 2002.  6.  Hypercholesterolemia.  7.  He had surgery on both feet and both rotator cuffs.  8.  May 03, 2005, he had a myocardial perfusion test which revealed a  partially reversible decreased myocardial perfusion involving the      inferior and posterior walls of the left ventricle.  He has followup      scheduled with his cardiologist.   FAMILY HISTORY:  Mother died of lung cancer at age 72.  Father had a blood  clot and died at age 9.  Two sisters had breast cancer.  No family history  of colorectal cancer.   SOCIAL HISTORY:  He is widowed, has three children.  He works with  maintenance.  Stopped smoking in 1972.  No alcohol use.   REVIEW OF SYSTEMS:  See HPI for GI.  CARDIOPULMONARY:  He denies any chest  pain or shortness of breath.  CONSTITUTIONAL:  No weight loss.   PHYSICAL EXAMINATION:  VITAL SIGNS:  Weight 284, temperature 97.6, height 6  feet 2 inches, blood pressure 158/72, pulse 76.  GENERAL:  Pleasant, obese, elderly Caucasian male in no acute distress.  SKIN:  Warm and dry, no jaundice.  HEENT:  Conjunctivae are pink, sclerae nonicteric.  Oropharyngeal mucosa  pink and moist.  No lesions, erythema, or exudate.  No lymphadenopathy or  thyromegaly.  CHEST:  Lungs are clear to auscultation.  CARDIAC:  Reveals regular rate and rhythm, normal S1, S2.  No murmurs, rubs,  or gallops.  ABDOMEN:  Positive bowel sounds, obese but  symmetrical.  Soft, nontender,  nondistended.  No organomegaly or masses.  No rebound tenderness or  guarding.  No abdominal bruits or hernias.  EXTREMITIES:  No edema.   IMPRESSION:  Mr. Broady is a 70 year old gentleman who presented for 3-  week history of diarrhea with hematochezia.  Since the referral was made his  Clostridium difficile toxin titer came back positive and he has been treated  with Flagyl with good results.  Clinically, he is back to normal and having  solid bowel movements.  The issue is that of recent hematochezia.  May have  been related to C. difficile colitis, hemorrhoids, although the patient  tells me he has not had a colonoscopy in quite some time.  We probably ought  to go ahead with colonoscopy, primarily for colorectal cancer screening and  given recent hematochezia.  I discussed risks, alternatives, and benefits  with the patient and he is agreeable to proceed.   PLAN:  Colonoscopy at some point in the future.  Would like to give him a  little more time to get over his C. difficile infection.  Will discuss  further with Dr. Gala Romney regarding timing of colonoscopy.      Neil Crouch, P.ABridgette Habermann, M.D.  Electronically Signed    LL/MEDQ  D:  05/15/2005  T:  05/15/2005  Job:  [id number]   cc:   Nicki Reaper A. Wolfgang Phoenix, MD  Fax: [phone]

## 2010-06-23 NOTE — Procedures (Signed)
NAME:  [name], [name]             ACCOUNT NO.:  [id number]   MEDICAL RECORD NO.:  [id number]          PATIENT TYPE:  INP   LOCATION:  A220                          FACILITY:  [facility]   PHYSICIAN:  Bryson Dames, M.D.DATE OF BIRTH:  [birth year]   DATE OF PROCEDURE:  03/30/2005  DATE OF DISCHARGE:                                  ECHOCARDIOGRAM   INDICATIONS FOR PROCEDURE:  The patient has shortness of breath,  hypertension and had coronary artery disease with mild myocardial infarction  in 1996.  This study is being done to evaluate LV systolic function.   Technically, the study is adequate for interpretation.   RESULTS:  1.  Aorta.  Normal aortic root dimension of 3.9.  No evidence of aortic root      dilatation.  2.  Left atrium.  Left atrial size mildly dilated with a dimension of 4.4.      No evidence of LA thrombus or mass.  3.  Right atrium.  Normal RA dimension.  4.  Right ventricle.  Unable to assess RV adequately due to inability to see      RV free wall.  5.  Left ventricle.  Contractility is vigorous.  Ejection fraction estimate      is 60-70%.  Walls are thickened symmetrically with septal and posterior      walls both measuring about 1.5 to 1.6 in thickness.  No wall motion      abnormality appreciated.  6.  Pericardium.  No effusion seen.  7.  Aortic valve.  There is mild sclerosis of the aortic valve with no      stenosis.  There is trivial aortic regurgitation.  Mitral valve not      remarkable.  No evidence of prolapse or mitral stenosis.  8.  Pulmonic valve not well-seen.  9.  Tricuspid valve grossly within normal limits.  Difficult visualization      of the valve.  No obvious tricuspid regurgitation   FINAL DIAGNOSES:  1.  Left ventricular systolic function 72-09% ejection fraction with      concentric left ventricular hypertrophy.  2.  Mild left atrial dilatation.  3.  No significant valvular pathology.  4.  No pericardial effusion.     ______________________________  Bryson Dames, M.D.     WHG/MEDQ  D:  03/30/2005  T:  03/31/2005  Job:  [id number]   cc:   Leslye Peer, MD  Fax: [phone]

## 2010-06-23 NOTE — Op Note (Signed)
[facility]  Patient:    [name], [name]                        MRN: [id number] Proc. Date: 05/02/00 Attending:  Johnn Hai, M.D.                           Operative Report  PREOPERATIVE DIAGNOSES: 1. Displaced medial meniscus tear to the left knee. 2. Pan degenerative joint disease.  POSTOPERATIVE DIAGNOSES: 1. Displaced medial meniscus tear to the left knee. 2. Pan degenerative joint disease.  OPERATION: 1. Left knee arthroscopy. 2. Partial medial meniscectomy. 3. Chondroplasty of the patella, mediofemoral condyle, and mediolateral    tibial plateau, noted grade IV changes of the femoral sulcus and subtotal    tear of the anterior cruciate ligament.  SURGEON:  Johnn Hai, M.D.  BRIEF HISTORY:  A 70 year old with refractory knee pain and swelling.  MRI indicated a displaced medial meniscus tear and tricompartmental osteoarthrosis.  Operative management was indicated for right knee arthroscopy and partial medial meniscectomy and debridement.  Risks, benefits, and complications including bleeding, infection, injury to vascular structures, no change in symptoms, need for total knee arthroplasty in the future, DVT, PE, etc.  TECHNIQUE:  The patient was placed in the supine position after induction of adequate general anesthesia.  Kefzol 1 g given.  The left lower extremity was prepped and draped in the usual sterile fashion.  A lateral parapatellar port and a superior medial parapatellar port were fashioned with a #11 blade.  Ingress cannula atraumatically placed. Irrigation was utilized to insufflate the joint.  Arthroscopic camera inserted via the lateral compartment.  The knee was copiously lavaged.  Inspection of the suprapatellar pouch revealed extensive grade III and grade IV changes of the patellofemoral joint.  The ACL showed a subtotal tear.  There was no evidence of intraoperative instability of the joint, however.  On  direct visualization, an 18-gauge needle was utilized to the medial parapatellar port which was fashioned with a #11 blade, sparing the medial meniscus.  There was a large displaced medial meniscus type tear, bucket handle in variety, extending from the anterior horn to the posterior horn.  There was a complex tear at the posterior horn as well.  A combination of basket rongeur and a shaver was utilized to perform a partial medial meniscectomy to a stable base. It was contoured with the shaver.  Following this, probe revealed stable meniscal remnant.  Extensive grade III chondral changes of the medial compartment, and a shaver was utilized to perform a chondroplasty.  Lateral compartment revealed some minor grade II and III changes of the condyles. There was no meniscus tear and stable to probe palpation, and mild debridement was performed in the lateral compartment.  There was normal patellofemoral tracking, and the gutters were unremarkable.  All compartments were revisited with no evidence of loose cartilaginous debris.  The knee was copiously lavaged.  The chondral surfaces were inspected with a probe, and no evidence of other defects were noted.  The knee was copiously irrigated.  All instrumentation was removed.   Portals were closed with 4-0 nylon simple sutures; 0.5% Marcaine with epinephrine was infiltrated in the knee.  Wound was dressed sterilely and secured with an Ace bandage.  The patient was awakened without difficulty and transported to the recovery room in satisfactory condition.  The patient tolerated the procedure well with no complications. DD:  05/02/00 TD:  05/02/00 Job: [id number]

## 2010-06-23 NOTE — Consult Note (Signed)
   NAME:  [name], [name]                       ACCOUNT NO.:  [id number]   MEDICAL RECORD NO.:  [id number]                   PATIENT TYPE:  EMS   LOCATION:  ED                                   FACILITY:  [facility]   PHYSICIAN:  J. Sanjuana Kava, M.D.              DATE OF BIRTH:  1940/11/08   DATE OF CONSULTATION:  DATE OF DISCHARGE:                                   CONSULTATION   Patient seen at the request of Dr. Ivy Lynn in the emergency room.   Patient was cutting down a tree limb with a chain-saw.  He was on a ladder.  He was up above the limb.  As he cut the limb broke loose and it went back  and hit him and the ladder.  The patient had sense enough to throw the chain-  saw away from him; but, he fell and tumbled and injured his right elbow and  hit his left ear.  Dr. Ivy Lynn has sutured the left ear.  Pain and tenderness  in the right elbow has got a fracture of the elbow on the right of the  coronoid process, essentially nondisplaced.   He has got pain and tenderness elbow, decreased range of motion of the right  elbow.  Chest is negative.  No loss of consciousness other then his ear  injury to his head.   Please refer to Dr. Ivy Lynn notes.  The x-ray reports are here and  incorporated in the reference notes, so I will not repeat them as well as  the nurse's notes.   IMPRESSION:  Fracture right elbow - coronoid process.   PLAN:  Posterior splint.  Prescription for Darvocet-N 100 given for pain.  Patient __________ was given and I will see him in the office on Friday  morning.  Sling has been provided.  Use ice.  Any difficulty return to the  emergency room.                                                Iona Hansen, M.D.    JWK/MEDQ  D:  07/28/2002  T:  07/28/2002  Job:  [id number]

## 2010-07-17 ENCOUNTER — Encounter: Payer: Self-pay | Admitting: Gastroenterology

## 2010-07-17 ENCOUNTER — Ambulatory Visit (INDEPENDENT_AMBULATORY_CARE_PROVIDER_SITE_OTHER): Payer: Medicare Other | Admitting: Gastroenterology

## 2010-07-17 DIAGNOSIS — K5289 Other specified noninfective gastroenteritis and colitis: Secondary | ICD-10-CM

## 2010-07-17 DIAGNOSIS — K529 Noninfective gastroenteritis and colitis, unspecified: Secondary | ICD-10-CM

## 2010-07-17 DIAGNOSIS — K259 Gastric ulcer, unspecified as acute or chronic, without hemorrhage or perforation: Secondary | ICD-10-CM

## 2010-07-17 NOTE — Assessment & Plan Note (Signed)
He will continue Rowasa enemas chronically but will decrease to every other night. He will call when refill needed. He may want to use mail order pharmacy. He will let us know. OV in 3 months to f/u IBD. He will call with any flares. Discussed chronicity of the disease. Will discuss with Dr. Gala Romney to determine when next colonoscopy needed.

## 2010-07-17 NOTE — Assessment & Plan Note (Signed)
He is due for surveillance EGD at this time. I have discussed the risks, alternatives, benefits with regards to but not limited to the risk of reaction to medication, bleeding, infection, perforation and the patient is agreeable to proceed. Written consent to be obtained.

## 2010-07-17 NOTE — Progress Notes (Signed)
Cc to PCP 

## 2010-07-17 NOTE — Progress Notes (Signed)
Primary Care Physician:  Sallee Lange, MD, MD  Primary Gastroenterologist:  Garfield Cornea, MD  Chief Complaint  Patient presents with  . EGD    gastric ulcers    HPI:  [name] is a 70 y.o. male here to schedule followup EGD for history of gastric ulcers. Since his last office visit he underwent colonoscopy for bloody diarrhea. He was found to have left-sided ulcerative colitis, no dysplasia on biopsies. He had similar findings back in 2007 but at that time also had C. difficile so was not clear whether or findings were related to resolving infection or underlying IBD. He has been on Rowasa enemas one per rectum in the evenings. He no longer sees any blood in the stool. His stools are formed. He has taken occasional laxative for constipation. Denies abdominal pain. No nausea vomiting or heartburn.  Current Outpatient Prescriptions  Medication Sig Dispense Refill  . amLODipine (NORVASC) 10 MG tablet Take 1 tablet by mouth daily.      Marland Kitchen aspirin 81 MG tablet Take 162 mg by mouth daily.        Marland Kitchen DIOVAN 160 MG tablet Take 1 tablet by mouth daily.      Marland Kitchen doxazosin (CARDURA) 8 MG tablet Take 0.5 tablets by mouth daily.      . furosemide (LASIX) 40 MG tablet Take 1 tablet by mouth as needed.      . mesalamine (ROWASA) 4 G enema Place 4 g rectally. Every other night       . metoprolol (TOPROL-XL) 50 MG 24 hr tablet Take 150 mg by mouth daily.        Marland Kitchen NITROSTAT 0.4 MG SL tablet Take 1 tablet by mouth as needed.      Marland Kitchen omeprazole (PRILOSEC) 40 MG capsule Take 1 tablet by mouth daily.      Marland Kitchen oxybutynin (DITROPAN-XL) 10 MG 24 hr tablet Take 1 tablet by mouth daily.      . pravastatin (PRAVACHOL) 40 MG tablet Take 1 tablet by mouth daily.        Allergies as of 07/17/2010  . (No Known Allergies)    Past Medical History  Diagnosis Date  . Hypertension   . DJD (degenerative joint disease)   . GERD (gastroesophageal reflux disease)   . Hyperlipidemia   . BPH (benign prostatic  hyperplasia)   . Obstructive sleep apnea   . Diverticulosis   . Gastric ulcer 04/17/10    Three 76m gastric ulcers, H.pylori serologies were negative  . Clostridium difficile colitis 04/2005  . Idiopathic chronic inflammatory bowel disease     left-sided UC    Past Surgical History  Procedure Date  . Colonoscopy 04/2005    granularity and friability erosions from rectum to 40cm. Bx infection vs IBD. C. Diff positive at the time.   . Knee surgery two  . Shoulder surgery two  . Foot surgery two  . Tonsillectomy   . Cervical spine surgery     C4-5  . Colonoscopy 05/2010    left-sided UC, bx with no dysplasia    Family History  Problem Relation Age of Onset  . Lung cancer Mother   . Stroke Father   . Colon cancer Neg Hx     History   Social History  . Marital Status: Widowed    Spouse Name: [name]    Number of Children: [name]  . Years of Education: [name]   Occupational History  . maintenance     retired   [employer]  History Main Topics  . Smoking status: Former Research scientist (life sciences)  . Smokeless tobacco: Never Used  . Alcohol Use: No  . Drug Use: No  . Sexually Active: Yes -- Male partner(s)    Birth Control/ Protection: None     spouse   Other Topics Concern  . Not on file   Social History Narrative  . No narrative on file      ROS:  General: Negative for anorexia, weight loss, fever, chills, fatigue, weakness. Eyes: Negative for vision changes.  ENT: Negative for hoarseness, difficulty swallowing , nasal congestion. CV: Negative for chest pain, angina, palpitations, dyspnea on exertion, peripheral edema.  Respiratory: Negative for dyspnea at rest, dyspnea on exertion, cough, sputum, wheezing.  GI: See history of present illness. GU:  Negative for dysuria, hematuria, urinary incontinence, urinary frequency, nocturnal urination.  MS: Negative for joint pain, low back pain.  Derm: Negative for rash or itching.  Neuro: Negative for weakness, abnormal sensation, seizure, frequent  headaches, memory loss, confusion.  Psych: Negative for anxiety, depression, suicidal ideation, hallucinations.  Endo: Negative for unusual weight change.  Heme: Negative for bruising or bleeding. Allergy: Negative for rash or hives.    Physical Examination:  BP 132/72  Pulse 64  Temp(Src) 97.6 F (36.4 C) (Temporal)  Ht 6' 1"  (1.854 m)  Wt 291 lb (131.997 kg)  BMI 38.39 kg/m2   General: Well-nourished, well-developed in no acute distress.  Head: Normocephalic, atraumatic.   Eyes: Conjunctiva pink, no icterus. Mouth: Oropharyngeal mucosa moist and pink , no lesions erythema or exudate. Neck: Supple without thyromegaly, masses, or lymphadenopathy.  Lungs: Clear to auscultation bilaterally.  Heart: Regular rate and rhythm, no murmurs rubs or gallops.  Abdomen: Bowel sounds are normal, obese, nontender, nondistended, no hepatosplenomegaly or masses, no abdominal bruits or    hernia , no rebound or guarding.   Extremities: No lower extremity edema.  Neuro: Alert and oriented x 4 , grossly normal neurologically.  Skin: Warm and dry, no rash or jaundice.   Psych: Alert and cooperative, normal mood and affect.

## 2010-07-24 ENCOUNTER — Other Ambulatory Visit: Payer: Self-pay | Admitting: Internal Medicine

## 2010-07-24 ENCOUNTER — Ambulatory Visit (HOSPITAL_COMMUNITY)
Admission: RE | Admit: 2010-07-24 | Discharge: 2010-07-24 | Disposition: A | Discharge status: Home / Self Care | Payer: Medicare Other | Source: Ambulatory Visit | Attending: Internal Medicine | Admitting: Internal Medicine

## 2010-07-24 ENCOUNTER — Encounter: Payer: Medicare Other | Admitting: Internal Medicine

## 2010-07-24 DIAGNOSIS — K257 Chronic gastric ulcer without hemorrhage or perforation: Secondary | ICD-10-CM | POA: Insufficient documentation

## 2010-07-24 DIAGNOSIS — E785 Hyperlipidemia, unspecified: Secondary | ICD-10-CM | POA: Insufficient documentation

## 2010-07-24 DIAGNOSIS — Z9889 Other specified postprocedural states: Secondary | ICD-10-CM

## 2010-07-24 DIAGNOSIS — K259 Gastric ulcer, unspecified as acute or chronic, without hemorrhage or perforation: Secondary | ICD-10-CM

## 2010-07-24 DIAGNOSIS — G4733 Obstructive sleep apnea (adult) (pediatric): Secondary | ICD-10-CM | POA: Insufficient documentation

## 2010-07-24 DIAGNOSIS — I10 Essential (primary) hypertension: Secondary | ICD-10-CM | POA: Insufficient documentation

## 2010-07-24 DIAGNOSIS — Z7982 Long term (current) use of aspirin: Secondary | ICD-10-CM | POA: Insufficient documentation

## 2010-07-24 DIAGNOSIS — Z79899 Other long term (current) drug therapy: Secondary | ICD-10-CM | POA: Insufficient documentation

## 2010-07-24 HISTORY — DX: Other specified postprocedural states: Z98.890

## 2010-07-24 HISTORY — PX: ESOPHAGOGASTRODUODENOSCOPY: SHX1529

## 2010-08-06 ENCOUNTER — Encounter: Payer: Self-pay | Admitting: Internal Medicine

## 2010-08-16 ENCOUNTER — Other Ambulatory Visit: Payer: Self-pay | Admitting: Family Medicine

## 2010-08-16 DIAGNOSIS — R918 Other nonspecific abnormal finding of lung field: Secondary | ICD-10-CM

## 2010-08-21 ENCOUNTER — Telehealth: Payer: Self-pay | Admitting: Gastroenterology

## 2010-08-21 NOTE — Telephone Encounter (Signed)
Please NIC for next TCS to be done 05/2015.

## 2010-08-21 NOTE — Op Note (Signed)
  NAME:  [name], [name]             ACCOUNT NO.:  [id number]  MEDICAL RECORD NO.:  [id number]  LOCATION:  [facility]                          FACILITY:  [facility]  PHYSICIAN:  R. Garfield Cornea, MD FACP FACGDATE OF BIRTH:  Mar 01, 1940  DATE OF PROCEDURE:  07/24/2010 DATE OF DISCHARGE:                              OPERATIVE REPORT   PROCEDURE:  Diagnostic incomplete esophagogastroduodenoscopy with biopsy.  INDICATIONS FOR PROCEDURE:  The patient is a 70 year old gentleman who was found to have gastric ulcers on EGD 3 months ago.  Biopsies were negative.  I believe he has been on treatment since that time, H. pylori evaluation was negative.  Look back now being done.  Clinically, he is doing well without any GI symptoms whatsoever.  This approach has discussed with the patient at length.  Risks, benefits, limitations, alternatives, and imponderables have been reviewed.  Please see the documentation medical record.  PROCEDURE NOTE:  O2 saturation, blood pressure, pulse, and respirations were monitored throughout the entirety of the procedure.  CONSCIOUS SEDATION:  Versed 5 mg IV and Demerol 50 mg IV in divided doses.  INSTRUMENT:  Pentax video chip system.  Cetacaine spray for topical pharyngeal anesthesia.  FINDINGS:  Examination of the tubular esophagus revealed no mucosal abnormalities.  EG junction was easily traversed.  Stomach:  Gastric cavity was not emptied, quite a bit of retained food debris which precluded complete exam (procedure done at 7:30 a.m., last intake of solid food reportedly 6:30 p.m. the night before).  All the gastric mucosa was not seen.  However, the antrum was seen well and previously noted ulcers completely healed level 2 areas of raised nodularities with central depression, more consistent with a pancreatic rest-type lesion anything else in the antrum.  The remainder of the medial gastric mucosa which was seen and appeared normal.  These two areas were  biopsied. Pylorus was patent, easily traversed.  Examination of the bulb and second portion revealed no abnormalities.  The patient tolerated the procedure well.  IMPRESSION: 1. Normal esophagus, incomplete exam with stomach reasons outlined     above, status post biopsy of gastric nodules, normal D1 and D2. 2. The patient may have delayed gastric emptying clinically to solid     at this time.  RECOMMENDATIONS: 1. We can drop back on his omeprazole 20 mg once daily. 2. Follow up on path. 3. Further recommendations to follow copy.     Bridgette Habermann, MD FACP [facility]     RMR/MEDQ  D:  07/24/2010  T:  07/24/2010  Job:  [id number]  cc:   Nicki Reaper A. Wolfgang Phoenix, MD Fax: [phone]  Electronically Signed by Jannette Spanner M.D. on 08/21/2010 09:18:16 AM

## 2010-08-21 NOTE — Telephone Encounter (Signed)
TCS reminder 05/2015 is in the computer

## 2010-08-28 ENCOUNTER — Ambulatory Visit (HOSPITAL_COMMUNITY)
Admission: RE | Admit: 2010-08-28 | Discharge: 2010-08-28 | Disposition: A | Discharge status: Home / Self Care | Payer: Medicare Other | Source: Ambulatory Visit | Attending: Family Medicine | Admitting: Family Medicine

## 2010-08-28 DIAGNOSIS — R918 Other nonspecific abnormal finding of lung field: Secondary | ICD-10-CM

## 2010-08-28 DIAGNOSIS — J984 Other disorders of lung: Secondary | ICD-10-CM | POA: Insufficient documentation

## 2010-08-28 DIAGNOSIS — J449 Chronic obstructive pulmonary disease, unspecified: Secondary | ICD-10-CM | POA: Insufficient documentation

## 2010-08-28 DIAGNOSIS — J4489 Other specified chronic obstructive pulmonary disease: Secondary | ICD-10-CM | POA: Insufficient documentation

## 2010-08-28 MED ORDER — IOHEXOL 300 MG/ML  SOLN
80.0000 mL | Freq: Once | INTRAMUSCULAR | Status: AC | PRN
  Administered 2010-08-28: 80 mL via INTRAVENOUS

## 2010-10-18 ENCOUNTER — Ambulatory Visit: Payer: Medicare Other | Admitting: Urgent Care

## 2010-10-18 ENCOUNTER — Ambulatory Visit (INDEPENDENT_AMBULATORY_CARE_PROVIDER_SITE_OTHER): Payer: Medicare Other | Admitting: Urgent Care

## 2010-10-18 ENCOUNTER — Encounter: Payer: Self-pay | Admitting: Urgent Care

## 2010-10-18 DIAGNOSIS — Z8619 Personal history of other infectious and parasitic diseases: Secondary | ICD-10-CM

## 2010-10-18 DIAGNOSIS — K529 Noninfective gastroenteritis and colitis, unspecified: Secondary | ICD-10-CM

## 2010-10-18 DIAGNOSIS — K259 Gastric ulcer, unspecified as acute or chronic, without hemorrhage or perforation: Secondary | ICD-10-CM

## 2010-10-18 DIAGNOSIS — K5289 Other specified noninfective gastroenteritis and colitis: Secondary | ICD-10-CM

## 2010-10-18 DIAGNOSIS — R197 Diarrhea, unspecified: Secondary | ICD-10-CM | POA: Insufficient documentation

## 2010-10-18 LAB — COMPREHENSIVE METABOLIC PANEL
ALT: 14 U/L (ref 0–53)
AST: 18 U/L (ref 0–37)
Albumin: 3.8 g/dL (ref 3.5–5.2)
Alkaline Phosphatase: 82 U/L (ref 39–117)
BUN: 11 mg/dL (ref 6–23)
CO2: 23 mEq/L (ref 19–32)
Calcium: 9.1 mg/dL (ref 8.4–10.5)
Chloride: 108 mEq/L (ref 96–112)
Creat: 0.93 mg/dL (ref 0.50–1.35)
Glucose, Bld: 102 mg/dL — ABNORMAL HIGH (ref 70–99)
Potassium: 4.3 mEq/L (ref 3.5–5.3)
Sodium: 143 mEq/L (ref 135–145)
Total Bilirubin: 0.7 mg/dL (ref 0.3–1.2)
Total Protein: 6.7 g/dL (ref 6.0–8.3)

## 2010-10-18 LAB — CBC WITH DIFFERENTIAL/PLATELET
Basophils Absolute: 0.1 10*3/uL (ref 0.0–0.1)
Basophils Relative: 1 % (ref 0–1)
Eosinophils Absolute: 0.5 10*3/uL (ref 0.0–0.7)
Eosinophils Relative: 6 % — ABNORMAL HIGH (ref 0–5)
HCT: 41.9 % (ref 39.0–52.0)
Hemoglobin: 14 g/dL (ref 13.0–17.0)
Lymphocytes Relative: 34 % (ref 12–46)
Lymphs Abs: 2.5 10*3/uL (ref 0.7–4.0)
MCH: 29.9 pg (ref 26.0–34.0)
MCHC: 33.4 g/dL (ref 30.0–36.0)
MCV: 89.5 fL (ref 78.0–100.0)
Monocytes Absolute: 0.6 10*3/uL (ref 0.1–1.0)
Monocytes Relative: 8 % (ref 3–12)
Neutro Abs: 3.8 10*3/uL (ref 1.7–7.7)
Neutrophils Relative %: 51 % (ref 43–77)
Platelets: 217 10*3/uL (ref 150–400)
RBC: 4.68 MIL/uL (ref 4.22–5.81)
RDW: 13.6 % (ref 11.5–15.5)
WBC: 7.4 10*3/uL (ref 4.0–10.5)

## 2010-10-18 MED ORDER — MESALAMINE 1000 MG RE SUPP: 1000.0000 mg | RECTAL | Status: DC

## 2010-10-18 MED ORDER — MESALAMINE 4 G RE ENEM: 4.0000 g | ENEMA | Freq: Every day | RECTAL | Status: DC

## 2010-10-18 MED ORDER — MESALAMINE 1.2 G PO TBEC: 1200.0000 mg | DELAYED_RELEASE_TABLET | Freq: Two times a day (BID) | ORAL | Status: DC

## 2010-10-18 NOTE — Assessment & Plan Note (Signed)
Peptic ulcer disease well-healed on last EGD June 2012. Continue Prilosec 40 mg daily.

## 2010-10-18 NOTE — Assessment & Plan Note (Signed)
See UC

## 2010-10-18 NOTE — Assessment & Plan Note (Addendum)
[name] has left-sided ulcerative colitis diagnosed earlier this year. He has been noncompliant with every other day Rowasa enemas. He is having bloody diarrhea. Differentials include UC flare, recurrent C. difficile colitis, other infectious process. Would advise oral mesalamine in the way of lialda for maintenance therapy since he is intolerant of chronic enema use. He does agree to short-term use.  Begin Rowasa enemas 4 grams nightly Begin Canasa suppositories every morning Begin Lialda twice daily Call or to ER if worse Stool for C. difficile PCR, culture and sensitivity, Ulcerative colitis literature

## 2010-10-18 NOTE — Progress Notes (Signed)
Primary Care Physician:  Sallee Lange, MD, MD Primary Gastroenterologist:  Dr. Gala Romney  Chief Complaint  Patient presents with  . Inflammatory Bowel Disease  . Rectal Bleeding   HPI:  [name] is a 70 y.o. male here for follow up for diarrhea and abdominal pain. Mr. Halter was diagnosed with left-sided ulcerative colitis earlier this year. He had been using rowasa enemas.  He notes he was having problems administering them himself, therefore he discontinued them and has been off all enemas for several months. He knows pain in bilat lower abdomen.  Pain 9/10, now pain free.  Woke up 3 days ago at 11pm and stayed up until wee hrs Monday AM w/ cramps & diarrhea.  C/o bloody slimy mucousy stool.  Lots of gas & bloating.  Denies fever.  +chills.  C/o Nausea.  Denies vomiting.  Wt stable.  Appetite ok.  Nothing tastes right to him.  Denies any recent antibiotics.  Girlfriend had 1 day of diarrhea recently.  He reports the day ate at [employer] Sunday after church prior to the onset of the symptoms. He does have history of C. difficile colitis back in 2007.  He also has history of peptic ulcer disease. Last EGD June 2012 resolved. He did have some retained gastric contents somewhat suspicious for gastroparesis. He denied any ongoing vomiting or severe nausea. He is on Prilosec 40 mg daily.  Past Medical History  Diagnosis Date  . Hypertension   . DJD (degenerative joint disease)   . GERD (gastroesophageal reflux disease)   . Hyperlipidemia   . BPH (benign prostatic hyperplasia)   . Obstructive sleep apnea   . Diverticulosis   . Gastric ulcer 04/17/10    Three 38m gastric ulcers, H.pylori serologies were negative  . Clostridium difficile colitis 04/2005  . Idiopathic chronic inflammatory bowel disease 05/18/2010    left-sided UC  . S/P endoscopy 07/24/10    retained gastric contents, benign bx   Past Surgical History  Procedure Date  . Colonoscopy 04/2005    granularity and friability  erosions from rectum to 40cm. Bx infection vs IBD. C. Diff positive at the time.   . Knee surgery two  . Shoulder surgery two  . Foot surgery two  . Tonsillectomy   . Cervical spine surgery     C4-5  . Colonoscopy 05/2010    left-sided UC, bx with no dysplasia   Current Outpatient Prescriptions  Medication Sig Dispense Refill  . amLODipine (NORVASC) 10 MG tablet Take 1 tablet by mouth daily.      .Marland Kitchenaspirin 81 MG tablet Take 162 mg by mouth daily.        .Marland KitchenDIOVAN 160 MG tablet Take 1 tablet by mouth daily.      .Marland Kitchendoxazosin (CARDURA) 8 MG tablet Take 0.5 tablets by mouth daily.      . furosemide (LASIX) 40 MG tablet Take 1 tablet by mouth as needed.      . mesalamine (ROWASA) 4 G enema Place 60 mLs (4 g total) rectally at bedtime. Every other night  31 Bottle  1  . metoprolol (TOPROL-XL) 50 MG 24 hr tablet Take 150 mg by mouth daily.        .Marland KitchenNITROSTAT 0.4 MG SL tablet Take 1 tablet by mouth as needed.      .Marland Kitchenomeprazole (PRILOSEC) 40 MG capsule Take 1 tablet by mouth daily.      .Marland Kitchenoxybutynin (DITROPAN-XL) 10 MG 24 hr tablet Take 1 tablet by  mouth daily.      . pravastatin (PRAVACHOL) 40 MG tablet Take 1 tablet by mouth daily.      . mesalamine (CANASA) 1000 MG suppository Place 1 suppository (1,000 mg total) rectally every morning.  31 suppository  1  . mesalamine (LIALDA) 1.2 G EC tablet Take 1 tablet (1.2 g total) by mouth 2 (two) times daily.  62 tablet  5   Allergies as of 10/18/2010  . (No Known Allergies)   Family History:There is no known family history of colorectal carcinoma , liver disease, or inflammatory bowel disease.  Problem Relation Age of Onset  . Lung cancer Mother   . Stroke Father   . Colon cancer Neg Hx     History   Social History  . Marital Status: Widowed    Spouse Name: [name]    Number of Children: [name]  . Years of Education: [name]   Occupational History  . maintenance     retired   Social History Main Topics  . Smoking status: Former Research scientist (life sciences)  .  Smokeless tobacco: Never Used  . Alcohol Use: No  . Drug Use: No  . Sexually Active: Yes -- Male partner(s)    Birth Control/ Protection: None     spouse   Review of Systems: Gen: See HPI. CV: Denies chest pain, angina, palpitations, syncope, orthopnea, PND, peripheral edema, and claudication. Resp: Denies dyspnea at rest, dyspnea with exercise, cough, sputum, wheezing, coughing up blood, and pleurisy. GI: Denies vomiting blood, jaundice, and fecal incontinence.   Denies dysphagia or odynophagia. Derm: Denies rash, itching, dry skin, hives, moles, warts, or unhealing ulcers.  Psych: Denies depression, anxiety, memory loss, suicidal ideation, hallucinations, paranoia, and confusion. Heme: Denies bruising and enlarged lymph nodes.  Physical Exam: BP 143/69  Pulse 73  Temp(Src) 98 F (36.7 C) (Temporal)  Ht 6' 2"  (1.88 m)  Wt 285 lb 12.8 oz (129.638 kg)  BMI 36.69 kg/m2 General:   Alert,  Well-developed, iobese, pleasant and cooperative in NAD. Head:  Normocephalic and atraumatic. Eyes:  Sclera clear, no icterus.   Conjunctiva pink. Mouth:  No deformity or lesions, OP pink/moist. Neck:  Supple; no masses or thyromegaly. Heart:  Regular rate and rhythm; no murmurs, clicks, rubs,  or gallops. Abdomen:  Soft, obese, nontender and nondistended. No masses, hepatosplenomegaly or hernias noted. Normal bowel sounds, without guarding, and without rebound.   Msk:  Symmetrical without gross deformities. Normal posture. Pulses:  Normal pulses noted. Extremities:  With clubbing.  No edema. Neurologic:  Alert and  oriented x4;  grossly normal neurologically. Skin:  Intact without significant lesions or rashes. Cervical Nodes:  No significant cervical adenopathy. Psych:  Alert and cooperative. Normal mood and affect.

## 2010-10-18 NOTE — Progress Notes (Signed)
Cc to PCP 

## 2010-10-18 NOTE — Patient Instructions (Signed)
Begin Rowasa enemas 4 grams nightly Begin suppositories every morning Begin Lialda twice daily Call or to ER if worse Go get your labs today & return stools ASAP to the lab   Ulcerative Colitis Ulcerative colitis is a long lasting swelling and soreness (inflammation) of the colon (large intestine). In patients with ulcerative colitis, sores (ulcers) and inflammation of the inner lining of the colon lead to illness. Ulcerative colitis can also cause problems outside the digestive tract.  Ulcerative colitis is closely related to another condition of inflammation of the intestines called Crohn's disease. Together, they are frequently referred to as inflammatory bowel disease (IBD). Ulcerative colitis and Crohn's diseases are conditions that can last years to decades. Men and women are affected equally. They most commonly begin during adolescence and early adulthood.  SYMPTOMS  Common symptoms of ulcerative colitis include rectal bleeding and diarrhea. There is a wide range of symptoms among patients with this disease depending on how severe the disease is. Some of these symptoms are:  Abdominal pain or cramping.  Diarrhea.   Fever.   Tiredness (fatigue).   Weight loss.  Night sweats.   Rectal pain.   Feeling the immediate need to have a bowel movement (rectal urgency).   CAUSES  Ulcerative colitis is caused by increased activity of the immune system in the intestines. The immune system is the system that protects the body against disease such as harmful bacteria, viruses, fungi, and other foreign invaders. When the immune system overacts, it causes inflammation. The cause of the increased immune system activity is not known. This over activity causes long-lasting inflammation and ulceration. This condition may be passed down from ones parents (inherited). Brothers, sisters, children, and parents of patients with IBD are more likely to develop these diseases. It is not contagious. This means  you cannot catch it from someone else. DIAGNOSIS Your caregiver may suspect Crohn's disease based on your symptoms and exam. Blood tests may confirm that there is a problem. You may be asked to submit a stool specimen for examination. X-rays and CT scans may be necessary. Ultimately, the diagnosis is usually made after a flexible tube is inserted via your anus and your colon is examined under sedation (colonoscopy). With this test, the specialist can take a tiny tissue sample from inside the bowel (biopsy). Examination of this biopsy tissue under a microscopy can reveal Ulcerative Colitis as the cause of your symptoms. TREATMENT  There is no cure for ulcerative colitis.   Complications such as massive bleeding from the colon (hemorrhage), development of a hole in the colon (perforation), or the development of precancerous or cancerous changes of the colon may require surgery.   Medications are often used to decrease inflammation and control the immune system. These include medicines related to aspirin, steroid medications, and newer and stronger medications to slow down the immune system. Some medications may be used as suppositories or enemas. A number of other medications are used or have been studied. Your caregiver will make specific recommendations.  HOME CARE INSTRUCTIONS  There is no cure for ulcerative colitis disease. The best treatment is frequent checkups with your caregiver. Periodic reevaluation is important.   Symptoms such as diarrhea can be controlled with medications. Avoid foods that have a laxative effect such fresh fruit and vegetables and dairy products. During flare ups, you can rest your bowel by staying away from solid foods. Drink clear liquids frequently during the day. Electrolyte or rehydrating fluids are best. Your caregiver can help you  with suggestions. Drink often to prevent dehydration. When diarrhea has cleared, eat smaller meals and more often. Avoid food additives  and stimulants such as caffeine (coffee, tea, many sodas, or chocolate). Avoid dairy products. Enzyme supplements may help if you develop intolerance to a sugar in dairy products (lactose). Ask your caregiver or dietitian about specific dietary instructions.   If you had surgery, be sure you understand your care instructions thoroughly, including proper care of any surgical wounds.   Take any medications exactly as prescribed.   Try to maintain a positive attitude. Learn relaxation techniques such as self hypnosis, mental imaging, and muscle relaxation. If possible, avoid stresses that aggravate your condition. Exercise regularly. Follow your diet. Always get plenty of rest.  SEEK MEDICAL CARE IF:  Your symptoms fail to improve after a week or two of new treatment.   You experience continued weight loss.   You have ongoing crampy digestion or loose bowels.   You develop a new skin rash, skin sores, or eye problems.  SEEK IMMEDIATE MEDICAL CARE IF:  You have worsening of your symptoms or develop new symptoms.   You have an oral temperature above 101, not controlled by medicine.   You develop bloody diarrhea.   You have severe abdominal pain.  Document Released: 11/01/2004 Document Re-Released: 02/13/2009 [facility] Patient Information 2011 [place].

## 2010-10-18 NOTE — Assessment & Plan Note (Signed)
Consider recurrence.  Will check PCR.

## 2010-10-27 LAB — CLOSTRIDIUM DIFFICILE BY PCR: Toxigenic C. Difficile by PCR: NOT DETECTED

## 2010-10-29 LAB — STOOL CULTURE

## 2010-10-30 NOTE — Progress Notes (Signed)
Agree with adding Lialda. Would avoid steroids unless more severe Sx or fails to improve with Lialda. REVIEWED.

## 2010-10-30 NOTE — Progress Notes (Signed)
Quick Note:  Please call pt & let him know his stools were normal. Needs OV in 1 month or sooner if he is still having symptoms. Thanks ______

## 2010-11-02 ENCOUNTER — Encounter: Payer: Self-pay | Admitting: Urgent Care

## 2010-11-02 ENCOUNTER — Ambulatory Visit (INDEPENDENT_AMBULATORY_CARE_PROVIDER_SITE_OTHER): Payer: Medicare Other | Admitting: Urgent Care

## 2010-11-02 VITALS — BP 131/70 | HR 72 | Temp 97.7°F | Ht 74.0 in | Wt 294.4 lb

## 2010-11-02 DIAGNOSIS — K259 Gastric ulcer, unspecified as acute or chronic, without hemorrhage or perforation: Secondary | ICD-10-CM

## 2010-11-02 DIAGNOSIS — K5289 Other specified noninfective gastroenteritis and colitis: Secondary | ICD-10-CM

## 2010-11-02 DIAGNOSIS — K529 Noninfective gastroenteritis and colitis, unspecified: Secondary | ICD-10-CM

## 2010-11-02 MED ORDER — PREDNISONE 10 MG PO TABS: 10.0000 mg | ORAL_TABLET | ORAL | Status: AC

## 2010-11-02 MED ORDER — MESALAMINE 1.2 G PO TBEC: 2400.0000 mg | DELAYED_RELEASE_TABLET | Freq: Two times a day (BID) | ORAL | Status: DC

## 2010-11-02 NOTE — Progress Notes (Signed)
Referring Provider: Sallee Lange, MD Primary Care Physician:  Sallee Lange, MD, MD Primary Gastroenterologist:  Dr. Gala Romney  Chief Complaint  Patient presents with  . Rectal Bleeding    been 4-5 times today    HPI:  [name] is a 70 y.o. male here for follow up for left-sided UC flare.  He was seen by me 9/12 and started on rowasa enemas & canasa suppositories in an attempt to gain remission without steroids given hx of PUD.  He was also given rx for lialdia 1.2 po BID.  It was very expensive, but he finally got his daughter to help him afford the Lialda & started 6 days ago.  He, however, has not picked up canasa suppositories due to the fact they were $45.  He has plenty of Rowasa enemas, but admits he is not using them nightly because he can't get it up there & really has stopped trying.  He is still seeing bright red blood, BM 5 per day.  C/o Lots gas, liquid stools.  Denies N/V, fever or severe abd pain.  Able to go to work daily.   Past Medical History  Diagnosis Date  . Hypertension   . DJD (degenerative joint disease)   . GERD (gastroesophageal reflux disease)   . Hyperlipidemia   . BPH (benign prostatic hyperplasia)   . Obstructive sleep apnea   . Diverticulosis   . Gastric ulcer 04/17/10    Three 41m gastric ulcers, H.pylori serologies were negative  . Clostridium difficile colitis 04/2005  . Idiopathic chronic inflammatory bowel disease 05/18/2010    left-sided UC  . S/P endoscopy 07/24/10    retained gastric contents, benign bx    Past Surgical History  Procedure Date  . Colonoscopy 04/2005    granularity and friability erosions from rectum to 40cm. Bx infection vs IBD. C. Diff positive at the time.   . Knee surgery two  . Shoulder surgery two  . Foot surgery two  . Tonsillectomy   . Cervical spine surgery     C4-5  . Colonoscopy 05/2010    left-sided UC, bx with no dysplasia    Current Outpatient Prescriptions  Medication Sig Dispense Refill  . amLODipine  (NORVASC) 10 MG tablet Take 1 tablet by mouth daily.      .Marland Kitchenaspirin 81 MG tablet Take 162 mg by mouth daily.        .Marland KitchenDIOVAN 160 MG tablet Take 1 tablet by mouth daily.      .Marland Kitchendoxazosin (CARDURA) 8 MG tablet Take 0.5 tablets by mouth daily.      . furosemide (LASIX) 40 MG tablet Take 1 tablet by mouth as needed.      . mesalamine (CANASA) 1000 MG suppository Place 1 suppository (1,000 mg total) rectally every morning.  31 suppository  1  . mesalamine (ROWASA) 4 G enema Place 60 mLs (4 g total) rectally at bedtime. Every other night  31 Bottle  1  . metoprolol (TOPROL-XL) 50 MG 24 hr tablet Take 150 mg by mouth daily.        .Marland KitchenNITROSTAT 0.4 MG SL tablet Take 1 tablet by mouth as needed.      .Marland Kitchenomeprazole (PRILOSEC) 40 MG capsule Take 1 tablet by mouth daily.      .Marland Kitchenoxybutynin (DITROPAN-XL) 10 MG 24 hr tablet Take 1 tablet by mouth daily.      . pravastatin (PRAVACHOL) 40 MG tablet Take 1 tablet by mouth daily.      .Marland Kitchen  mesalamine (LIALDA) 1.2 G EC tablet Take 1 tablet (1.2 g total) by mouth 2 (two) times daily.  62 tablet  5    Allergies as of 11/02/2010  . (No Known Allergies)    Review of Systems: See HPI, otheriwse negative ROS  Physical Exam: BP 131/70  Pulse 72  Temp(Src) 97.7 F (36.5 C) (Temporal)  Ht 6' 2"  (1.88 m)  Wt 294 lb 6.4 oz (133.539 kg)  BMI 37.80 kg/m2 General:   Alert,  Well-developed, well-nourished, and cooperative in NAD.  Accompanied by his girlfriend. Head:  Normocephalic and atraumatic. Eyes:  Sclera clear, no icterus.   Conjunctiva pink. Mouth:  No deformity or lesions, OP pink/moist. Neck:  Supple; no masses or thyromegaly. Heart:  Regular rate and rhythm; no murmurs, clicks, rubs,  or gallops. Abdomen:  Soft, obese, nontender and nondistended. No masses, hepatosplenomegaly or hernias noted. Normal bowel sounds, without guarding, and without rebound.   Msk:  Symmetrical without gross deformities. Normal posture. Pulses:  Normal pulses noted. Extremities:   Without edema. Neurologic:  Alert and  oriented x4;  grossly normal neurologically. Skin:  Intact without significant lesions or rashes. Cervical Nodes:  No significant cervical adenopathy. Psych:  Alert and cooperative. Normal mood and affect.

## 2010-11-02 NOTE — Assessment & Plan Note (Signed)
Hx of PUD.  On Omeprazole 29m daily.  Encouraged compliance given hx PUD & steroids.

## 2010-11-02 NOTE — Patient Instructions (Signed)
Start prednisone as directed--Do not stop this medication early Increase lialda to 2 pills in morning & evening No suppositories or enemas  Ulcerative Colitis Ulcerative colitis is a long lasting swelling and soreness (inflammation) of the colon (large intestine). In patients with ulcerative colitis, sores (ulcers) and inflammation of the inner lining of the colon lead to illness. Ulcerative colitis can also cause problems outside the digestive tract.  Ulcerative colitis is closely related to another condition of inflammation of the intestines called Crohn's disease. Together, they are frequently referred to as inflammatory bowel disease (IBD). Ulcerative colitis and Crohn's diseases are conditions that can last years to decades. Men and women are affected equally. They most commonly begin during adolescence and early adulthood.  SYMPTOMS  Common symptoms of ulcerative colitis include rectal bleeding and diarrhea. There is a wide range of symptoms among patients with this disease depending on how severe the disease is. Some of these symptoms are:  Abdominal pain or cramping.  Diarrhea.   Fever.   Tiredness (fatigue).   Weight loss.  Night sweats.   Rectal pain.   Feeling the immediate need to have a bowel movement (rectal urgency).   CAUSES  Ulcerative colitis is caused by increased activity of the immune system in the intestines. The immune system is the system that protects the body against disease such as harmful bacteria, viruses, fungi, and other foreign invaders. When the immune system overacts, it causes inflammation. The cause of the increased immune system activity is not known. This over activity causes long-lasting inflammation and ulceration. This condition may be passed down from ones parents (inherited). Brothers, sisters, children, and parents of patients with IBD are more likely to develop these diseases. It is not contagious. This means you cannot catch it from someone  else. DIAGNOSIS Your caregiver may suspect Crohn's disease based on your symptoms and exam. Blood tests may confirm that there is a problem. You may be asked to submit a stool specimen for examination. X-rays and CT scans may be necessary. Ultimately, the diagnosis is usually made after a flexible tube is inserted via your anus and your colon is examined under sedation (colonoscopy). With this test, the specialist can take a tiny tissue sample from inside the bowel (biopsy). Examination of this biopsy tissue under a microscopy can reveal Ulcerative Colitis as the cause of your symptoms. TREATMENT  There is no cure for ulcerative colitis.   Complications such as massive bleeding from the colon (hemorrhage), development of a hole in the colon (perforation), or the development of precancerous or cancerous changes of the colon may require surgery.   Medications are often used to decrease inflammation and control the immune system. These include medicines related to aspirin, steroid medications, and newer and stronger medications to slow down the immune system. Some medications may be used as suppositories or enemas. A number of other medications are used or have been studied. Your caregiver will make specific recommendations.  HOME CARE INSTRUCTIONS  There is no cure for ulcerative colitis disease. The best treatment is frequent checkups with your caregiver. Periodic reevaluation is important.   Symptoms such as diarrhea can be controlled with medications. Avoid foods that have a laxative effect such fresh fruit and vegetables and dairy products. During flare ups, you can rest your bowel by staying away from solid foods. Drink clear liquids frequently during the day. Electrolyte or rehydrating fluids are best. Your caregiver can help you with suggestions. Drink often to prevent dehydration. When diarrhea has cleared,  eat smaller meals and more often. Avoid food additives and stimulants such as caffeine  (coffee, tea, many sodas, or chocolate). Avoid dairy products. Enzyme supplements may help if you develop intolerance to a sugar in dairy products (lactose). Ask your caregiver or dietitian about specific dietary instructions.   If you had surgery, be sure you understand your care instructions thoroughly, including proper care of any surgical wounds.   Take any medications exactly as prescribed.   Try to maintain a positive attitude. Learn relaxation techniques such as self hypnosis, mental imaging, and muscle relaxation. If possible, avoid stresses that aggravate your condition. Exercise regularly. Follow your diet. Always get plenty of rest.  SEEK MEDICAL CARE IF:  Your symptoms fail to improve after a week or two of new treatment.   You experience continued weight loss.   You have ongoing crampy digestion or loose bowels.   You develop a new skin rash, skin sores, or eye problems.  SEEK IMMEDIATE MEDICAL CARE IF:  You have worsening of your symptoms or develop new symptoms.   You have an oral temperature above 101, not controlled by medicine.   You develop bloody diarrhea.   You have severe abdominal pain.  Document Released: 11/01/2004 Document Re-Released: 02/13/2009 [facility] Patient Information 2011 [place].

## 2010-11-02 NOTE — Assessment & Plan Note (Addendum)
[name] is a 70 y.o. male has left-sided UC flare.  Very frustrated & wants something done, but admits to non-compliance w/ suppositories & enemas due to financial concerns & not wanting to use rectal preparations.  Explained lialda has not had enough time to work.  I did discuss side effects/adverse effects of steroids.  He prefers po meds.  Hopefully, this will increase his compliance to get his disease in remission.  Previous c diff, giardia negative.  Start prednisone taper  76m daily x 10d 36mdaily x10d 2019maily x10d [id number] Do not stop this medication early Increase lialda to 2 pills in morning & evening (4.8 grams daily) No suppositories or enemas Ulcerative Colitis literature--explained condition in-depth, questions answered, urged compliance

## 2010-11-02 NOTE — Progress Notes (Signed)
Cc to PCP 

## 2010-11-07 NOTE — Progress Notes (Signed)
Oh no!  Do they have any other pt assistance programs for him?

## 2010-11-07 NOTE — Progress Notes (Unsigned)
Pt came in with lialda card. He said he was having problems activating it. Informed pt that if he has medicare then it wouldn't activate because you cannot use savings cards with medicare. Gave pt 3 boxes of lialda samples.

## 2010-11-08 NOTE — Progress Notes (Signed)
Mailed letter and pt assistance info to pt.

## 2010-11-27 ENCOUNTER — Telehealth: Payer: Self-pay | Admitting: Internal Medicine

## 2010-11-27 NOTE — Telephone Encounter (Signed)
Nicholes Hibler is the daughter and POA for patient and had questions about his Lialda prescription whether or not if this was going to be a long term medicine. He is trying to decide which insurance to sign up for and which insurance will cover most of his prescriptions. Pt is paying $184 a month and was also wondering if there was something else cheaper he could take. Please call daughter back at [phone]

## 2010-11-27 NOTE — Telephone Encounter (Signed)
Tried to call pt- LMOM 

## 2010-11-28 NOTE — Telephone Encounter (Signed)
Tried to call pt's daughter- [facility]

## 2010-11-28 NOTE — Telephone Encounter (Signed)
Spoke with daughter, she was asking about pt assistance for lialda. Informed her I sent paperwork to her dad and she said he saves all the mail for her to work on when she goes to see him. She asked that I send paperwork to her at [address], [place], [place] [address]. Information in mail to her.

## 2010-11-30 NOTE — Progress Notes (Signed)
Pt called wanting sample of LIALDA. We will leave some up front for him.

## 2011-01-01 NOTE — Progress Notes (Signed)
REVIEWED.  

## 2011-01-01 NOTE — Progress Notes (Signed)
PT NEEDS OPV W/ RMR IN DEC OR JAN.

## 2011-03-20 ENCOUNTER — Other Ambulatory Visit: Payer: Self-pay | Admitting: Neurosurgery

## 2011-03-20 DIAGNOSIS — M47812 Spondylosis without myelopathy or radiculopathy, cervical region: Secondary | ICD-10-CM

## 2011-03-26 ENCOUNTER — Ambulatory Visit
Admission: RE | Admit: 2011-03-26 | Discharge: 2011-03-26 | Disposition: A | Discharge status: Home / Self Care | Payer: Medicare Other | Source: Ambulatory Visit | Attending: Neurosurgery | Admitting: Neurosurgery

## 2011-03-26 ENCOUNTER — Other Ambulatory Visit: Payer: Medicare Other

## 2011-03-26 ENCOUNTER — Other Ambulatory Visit: Payer: Self-pay | Admitting: Neurosurgery

## 2011-03-26 DIAGNOSIS — M47812 Spondylosis without myelopathy or radiculopathy, cervical region: Secondary | ICD-10-CM

## 2011-07-05 IMAGING — CR DG ABDOMEN 2V
3 series · 3 of 3 positions shown · non-contrast
Comparison: None.

CLINICAL DATA: The left upper quadrant pain

ABDOMEN - 2 VIEW

[view not recorded (1 of 3)]
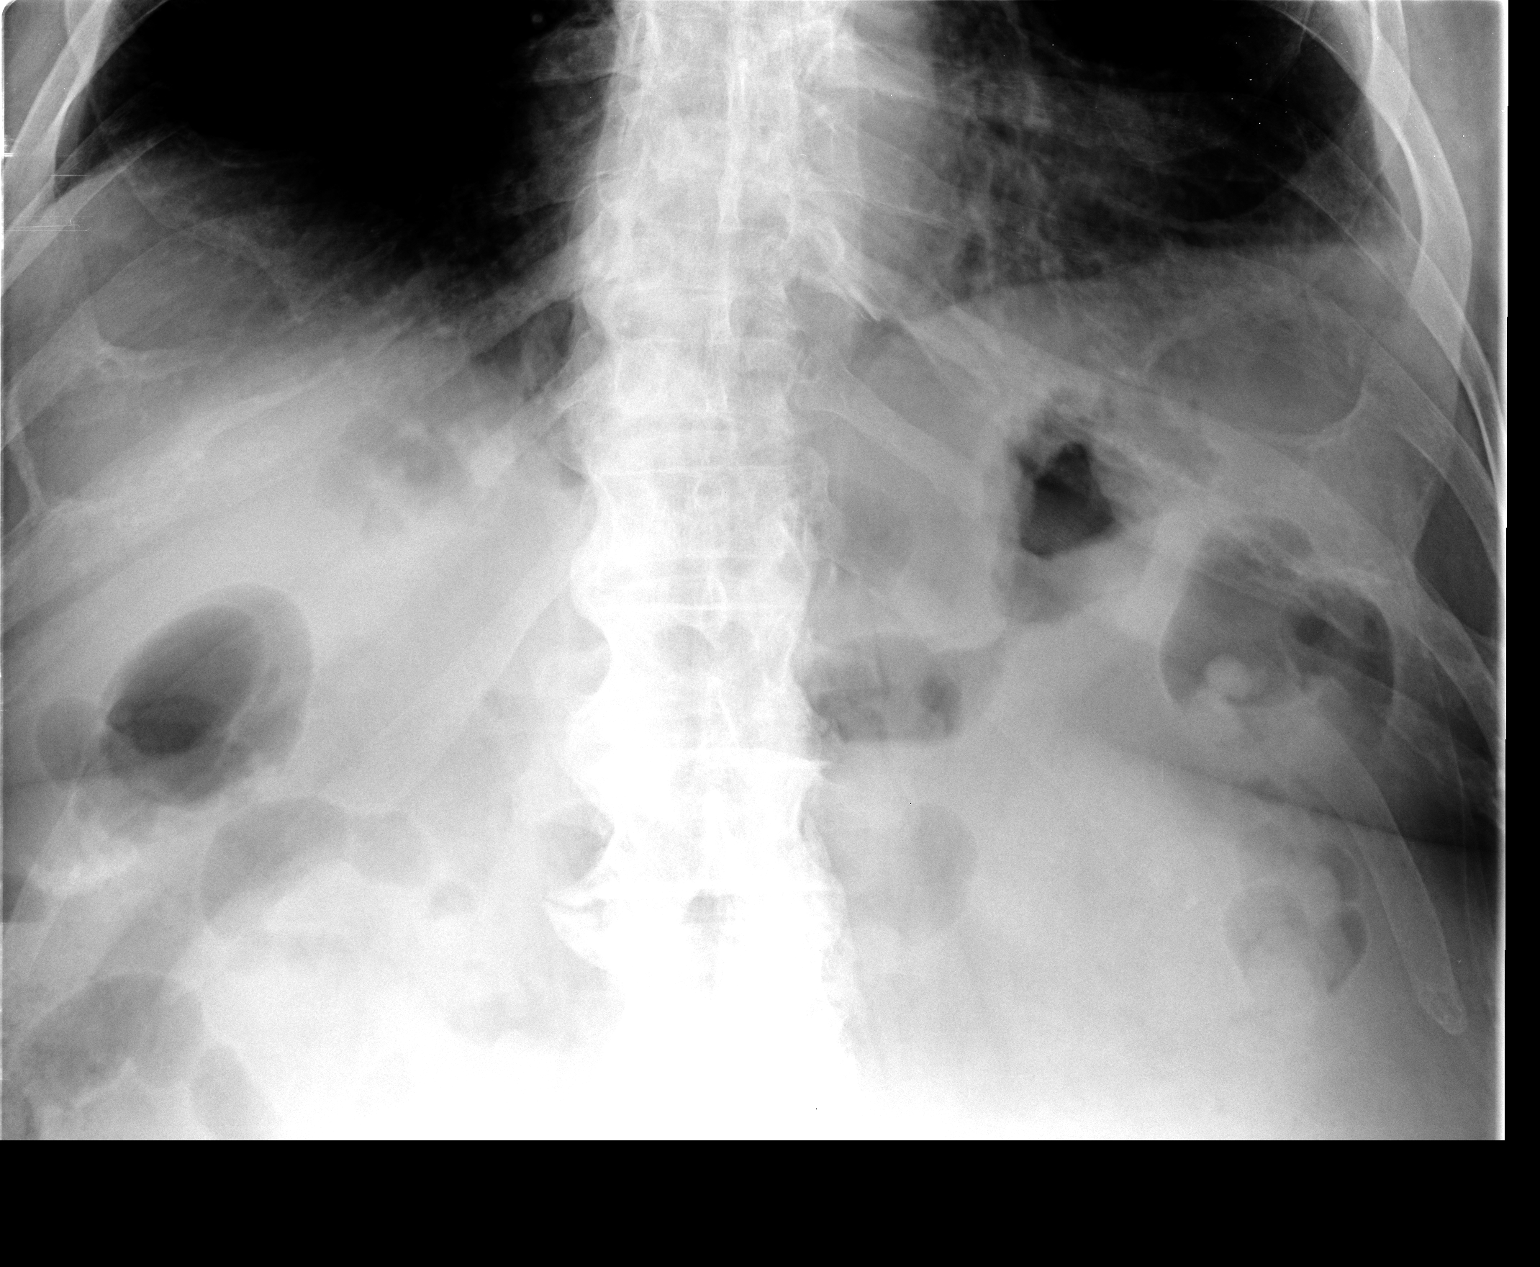

[view not recorded (2 of 3)]
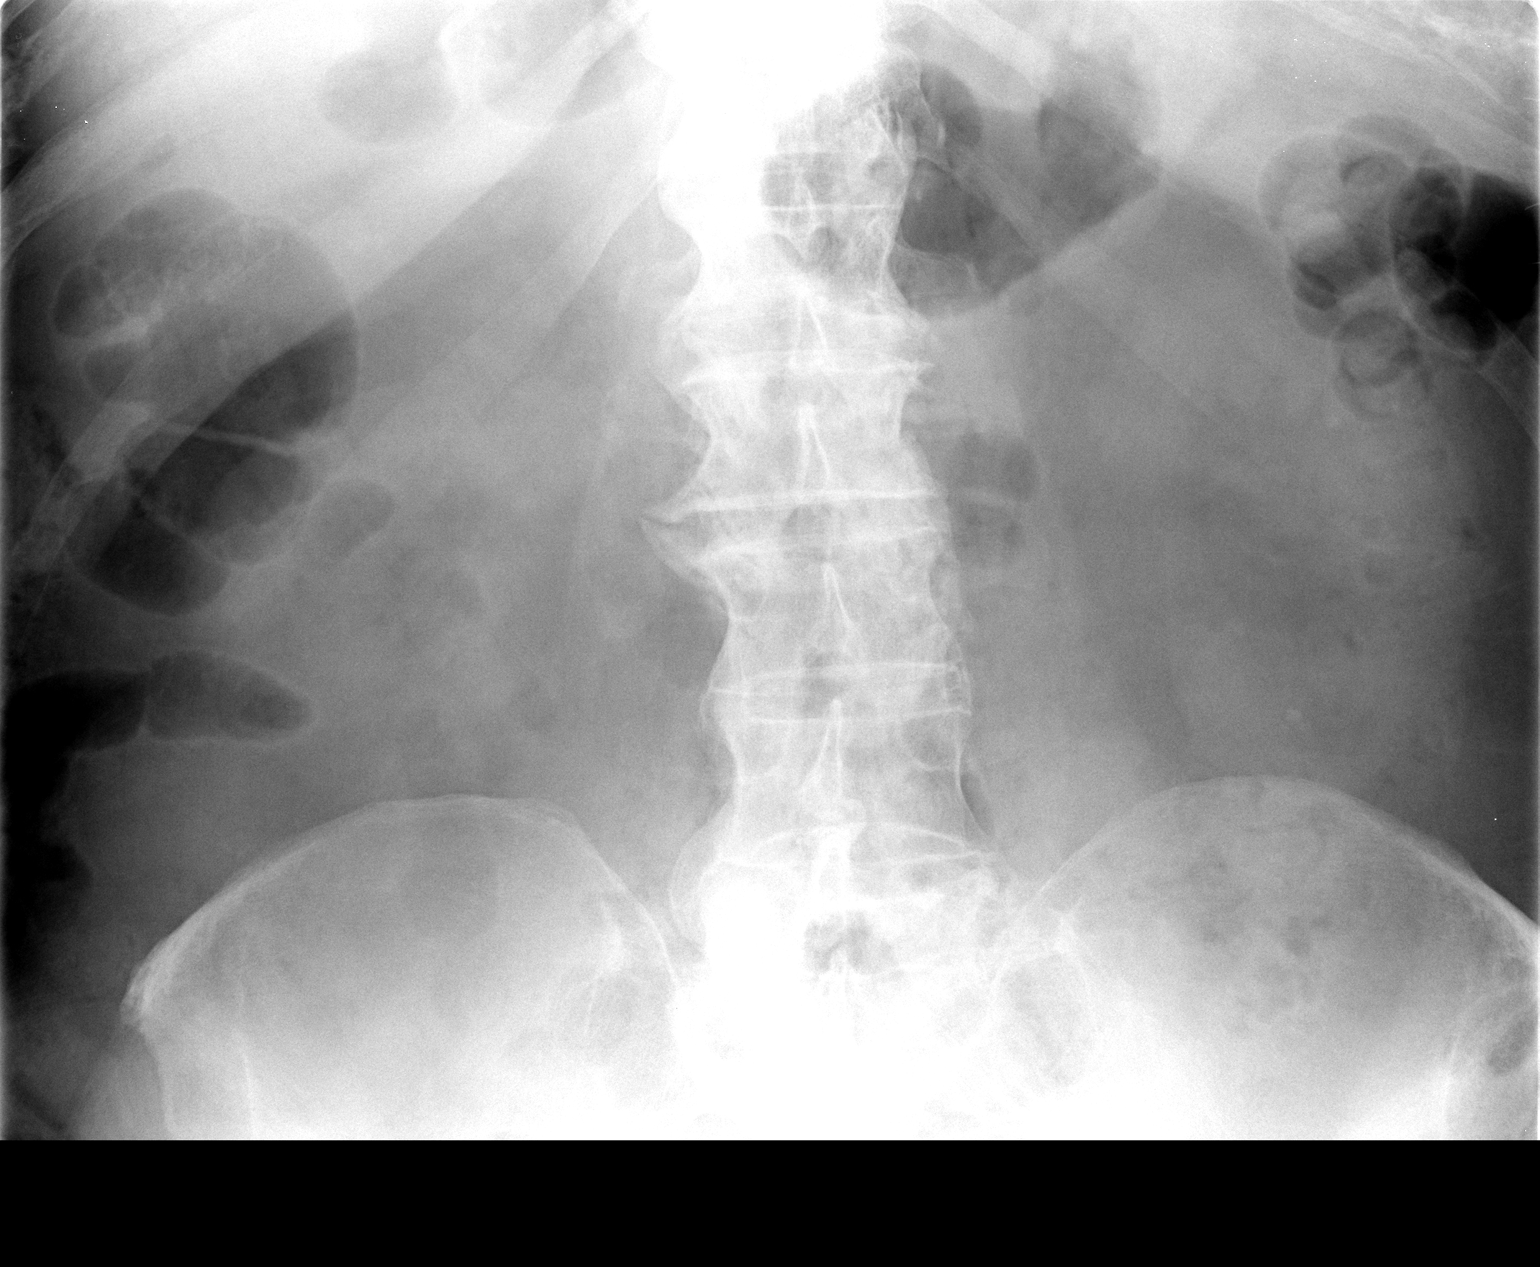

[view not recorded (3 of 3)]
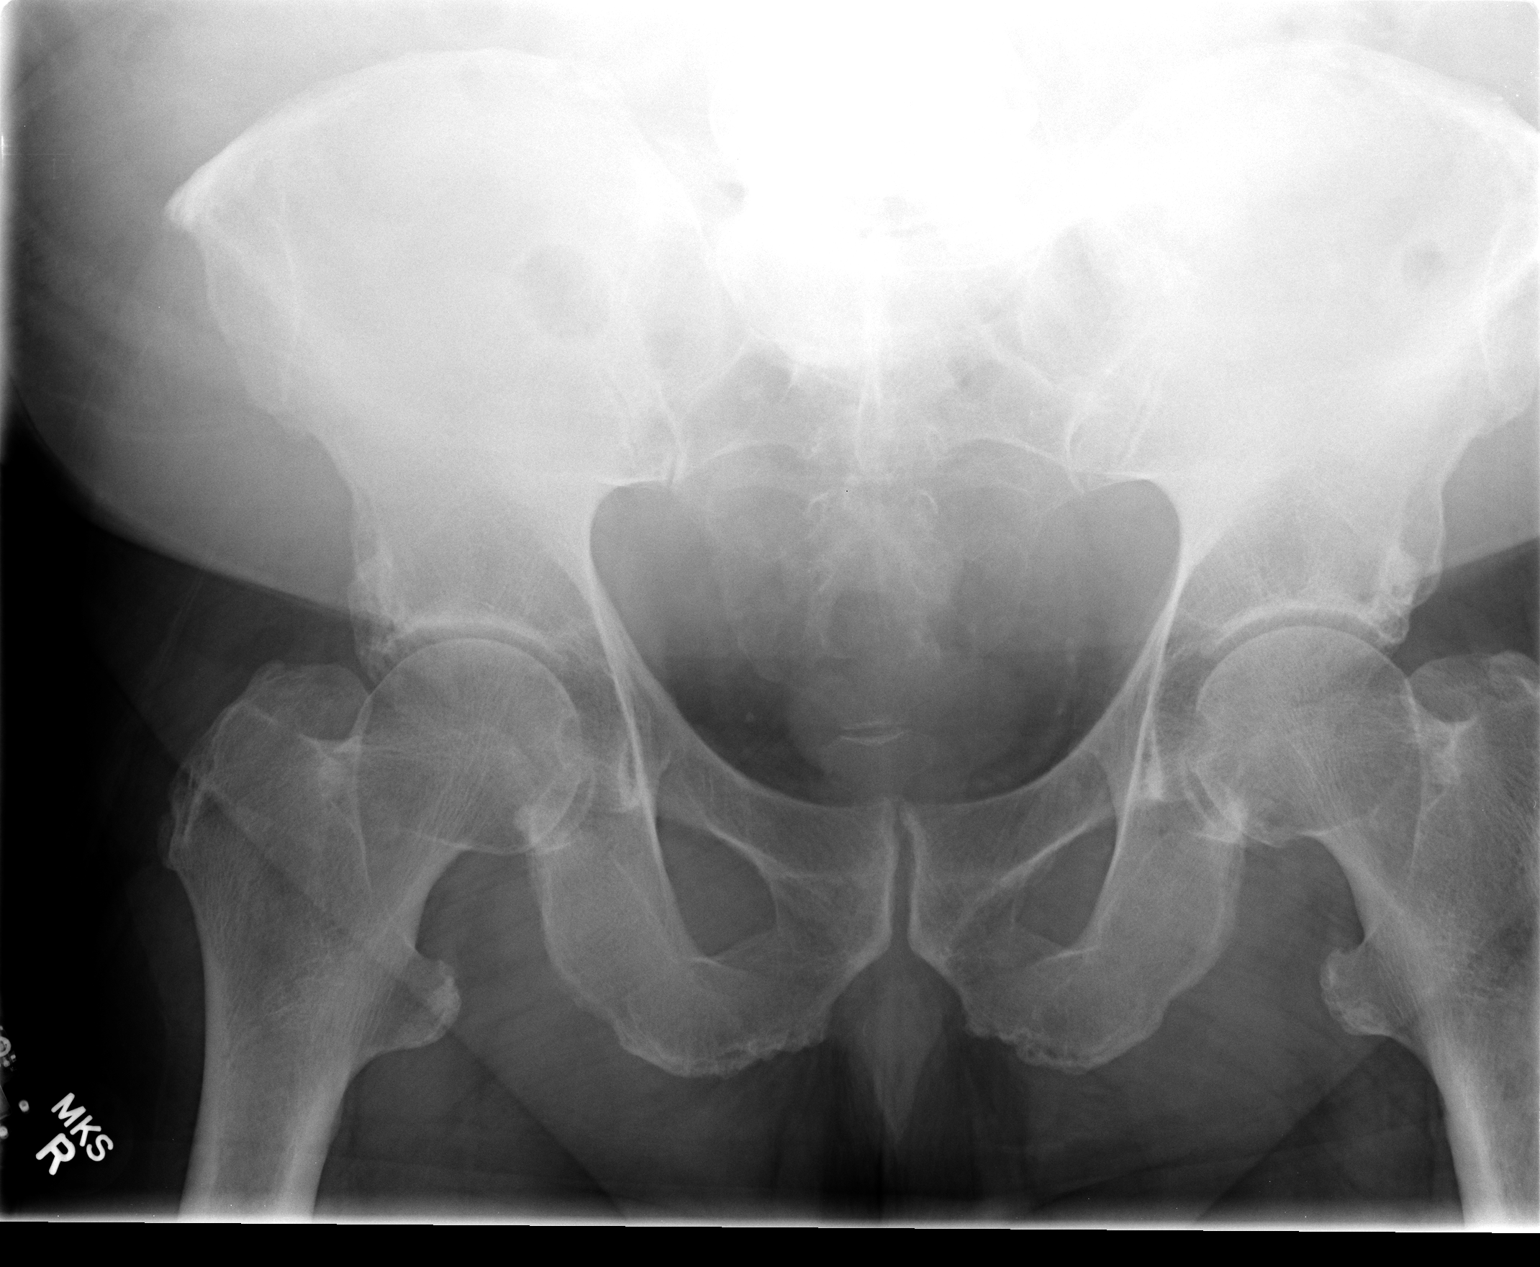

[3 of 3 positions shown; findings below may reference images not displayed]

FINDINGS: No free air or acute/specific abnormality of the bowel
gas pattern.  Psoas margins intact.  No pathological
calcifications.  Changes of spondylosis are noted in the spine.
IMPRESSION: No acute findings.

## 2011-10-09 DIAGNOSIS — Z23 Encounter for immunization: Secondary | ICD-10-CM | POA: Diagnosis not present

## 2011-12-26 ENCOUNTER — Encounter: Payer: Self-pay | Admitting: Urgent Care

## 2011-12-26 ENCOUNTER — Ambulatory Visit (INDEPENDENT_AMBULATORY_CARE_PROVIDER_SITE_OTHER): Payer: Medicare Other | Admitting: Urgent Care

## 2011-12-26 ENCOUNTER — Telehealth: Payer: Self-pay | Admitting: Urgent Care

## 2011-12-26 VITALS — BP 131/71 | HR 70 | Temp 98.0°F | Ht 74.0 in | Wt 297.2 lb

## 2011-12-26 DIAGNOSIS — K5190 Ulcerative colitis, unspecified, without complications: Secondary | ICD-10-CM

## 2011-12-26 DIAGNOSIS — K5150 Left sided colitis without complications: Secondary | ICD-10-CM | POA: Insufficient documentation

## 2011-12-26 MED ORDER — MESALAMINE 1.2 G PO TBEC: 2400.0000 mg | DELAYED_RELEASE_TABLET | Freq: Every day | ORAL | Status: DC

## 2011-12-26 NOTE — Progress Notes (Signed)
Faxed to PCP

## 2011-12-26 NOTE — Assessment & Plan Note (Signed)
[name] is a pleasant 71 y.o. male with ulcerative colitis. He has done well on maintenance Lialda 2.4 g daily. His only concern is access to Lialda given the cost.  We requested labs from PCP. None available since April 2013.  Needs CBC and CMP.  continue lialda 2.4 g daily-samples given. Will request prior authorization. If not approved we may continue to sample him given his great response. Office visit in one year with Dr. Gala Romney.

## 2011-12-26 NOTE — Patient Instructions (Addendum)
Office visit in one year with Dr. Gala Romney We have requested recent labs from Dr. Malachy Moan office. We will call you need additional lab work. Continue Lialda 2 per day as directed

## 2011-12-26 NOTE — Telephone Encounter (Signed)
Pt is aware and he asked that I mail the lab order to him. Lab order in mail to pt.

## 2011-12-26 NOTE — Telephone Encounter (Signed)
Please call patient. Dr. Wolfgang Phoenix does not have any recent lab work. He will need CBC and CMP.

## 2011-12-26 NOTE — Progress Notes (Signed)
Primary Care Physician:  Sallee Lange, MD Primary Gastroenterologist:  Dr. Gala Romney  Chief Complaint  Patient presents with  . Follow-up    Ulcerative colitis    HPI:  [name] is a 71 y.o. male here for follow up for ulcerative colitis. He has done well since he was seen over a year ago. He has been taking Lialda 2.4 g daily. He usually only eats 2 meals per day and has occasional stomach growling. Otherwise he feels fine. He denies any abdominal pain, nausea, vomiting, diarrhea, rectal bleeding or melena.  He is very concerned because Lialda is too expensive, however it seems to work very well for him. He believes he has had recent labwork through Dr.Luking's office. Last colonoscopy 05/2010 and he somehow left-sided ulcerative colitis. He also has history of peptic ulcer disease.   Denies heartburn, indigestion, nausea, vomiting, dysphagia, odynophagia or anorexia. He is taking Prilosec 20 mg daily. Past Medical History  Diagnosis Date  . Hypertension   . DJD (degenerative joint disease)   . GERD (gastroesophageal reflux disease)   . Hyperlipidemia   . BPH (benign prostatic hyperplasia)   . Obstructive sleep apnea   . Diverticulosis   . Gastric ulcer 04/17/10    Three 41m gastric ulcers, H.pylori serologies were negative  . Clostridium difficile colitis 04/2005  . Idiopathic chronic inflammatory bowel disease 05/18/2010    left-sided UC  . S/P endoscopy 07/24/10    retained gastric contents, benign bx    Past Surgical History  Procedure Date  . Colonoscopy 04/2005    granularity and friability erosions from rectum to 40cm. Bx infection vs IBD. C. Diff positive at the time.   . Knee surgery two  . Shoulder surgery two  . Foot surgery two  . Tonsillectomy   . Cervical spine surgery     C4-5  . Colonoscopy 05/2010    [place]k: left-sided UC, bx with no dysplasia, shallow diverticula    Current Outpatient Prescriptions  Medication Sig Dispense Refill  . amLODipine (NORVASC) 10  MG tablet Take 1 tablet by mouth daily.      .Marland Kitchenaspirin 81 MG tablet Take 162 mg by mouth daily.        .Marland KitchenDIOVAN 160 MG tablet Take 1 tablet by mouth daily.      .Marland Kitchendoxazosin (CARDURA) 8 MG tablet Take 0.5 tablets by mouth daily.      . furosemide (LASIX) 40 MG tablet Take 1 tablet by mouth as needed.      . mesalamine (LIALDA) 1.2 G EC tablet Take 2 tablets (2.4 g total) by mouth daily.  72 tablet  0  . metoprolol (TOPROL-XL) 50 MG 24 hr tablet Take 150 mg by mouth daily.        .Marland KitchenNITROSTAT 0.4 MG SL tablet Take 1 tablet by mouth as needed.      .Marland Kitchenomeprazole (PRILOSEC) 40 MG capsule Take 1 tablet by mouth daily.      .Marland Kitchenoxybutynin (DITROPAN-XL) 10 MG 24 hr tablet Take 1 tablet by mouth daily.      . pravastatin (PRAVACHOL) 40 MG tablet Take 1 tablet by mouth daily.      . [DISCONTINUED] mesalamine (LIALDA) 1.2 G EC tablet Take 2 tablets (2.4 g total) by mouth 2 (two) times daily.  120 tablet  2  . [DISCONTINUED] mesalamine (LIALDA) 1.2 G EC tablet Take 2,400 mg by mouth daily.      . [DISCONTINUED] mesalamine (LIALDA) 1.2 G EC tablet Take 2  tablets (2.4 g total) by mouth daily.  60 tablet  11    Allergies as of 12/26/2011  . (No Known Allergies)    Review of Systems: See HPI, otheriwse negative ROS  Physical Exam: BP 131/71  Pulse 70  Temp 98 F (36.7 C) (Temporal)  Ht 6' 2"  (1.88 m)  Wt 297 lb 3.2 oz (134.809 kg)  BMI 38.16 kg/m2 General:   Alert,  Well-developed, obese, and cooperative in NAD.  Head:  Normocephalic and atraumatic. Eyes:  Sclera clear, no icterus.   Conjunctiva pink. Mouth:  No deformity or lesions, OP pink/moist. Neck:  Supple; no masses or thyromegaly. Heart:  Regular rate and rhythm; no murmurs, clicks, rubs,  or gallops. Abdomen:  Soft, obese, nontender and nondistended. No masses, hepatosplenomegaly or hernias noted. Normal bowel sounds, without guarding, and without rebound.   Msk:  Symmetrical without gross deformities.  Pulses:  Normal pulses  noted. Extremities:  Without edema. Neurologic:  Alert and  oriented x4;  grossly normal neurologically. Skin:  Intact without significant lesions or rashes. Cervical Nodes:  No significant cervical adenopathy. Psych:  Alert and cooperative. Normal mood and affect.

## 2012-01-02 LAB — CBC WITH DIFFERENTIAL/PLATELET
Basophils Absolute: 0 10*3/uL (ref 0.0–0.1)
Basophils Relative: 0 % (ref 0–1)
Eosinophils Absolute: 0.3 10*3/uL (ref 0.0–0.7)
Eosinophils Relative: 3 % (ref 0–5)
HCT: 42.4 % (ref 39.0–52.0)
Hemoglobin: 14.4 g/dL (ref 13.0–17.0)
Lymphocytes Relative: 40 % (ref 12–46)
Lymphs Abs: 3.6 10*3/uL (ref 0.7–4.0)
MCH: 29.3 pg (ref 26.0–34.0)
MCHC: 34 g/dL (ref 30.0–36.0)
MCV: 86.2 fL (ref 78.0–100.0)
Monocytes Absolute: 0.8 10*3/uL (ref 0.1–1.0)
Monocytes Relative: 9 % (ref 3–12)
Neutro Abs: 4.2 10*3/uL (ref 1.7–7.7)
Neutrophils Relative %: 48 % (ref 43–77)
Platelets: 216 10*3/uL (ref 150–400)
RBC: 4.92 MIL/uL (ref 4.22–5.81)
RDW: 14.2 % (ref 11.5–15.5)
WBC: 8.9 10*3/uL (ref 4.0–10.5)

## 2012-01-02 LAB — COMPREHENSIVE METABOLIC PANEL
ALT: 19 U/L (ref 0–53)
AST: 23 U/L (ref 0–37)
Albumin: 3.8 g/dL (ref 3.5–5.2)
Alkaline Phosphatase: 78 U/L (ref 39–117)
BUN: 15 mg/dL (ref 6–23)
CO2: 28 mEq/L (ref 19–32)
Calcium: 9.2 mg/dL (ref 8.4–10.5)
Chloride: 108 mEq/L (ref 96–112)
Creat: 1 mg/dL (ref 0.50–1.35)
Glucose, Bld: 76 mg/dL (ref 70–99)
Potassium: 4.6 mEq/L (ref 3.5–5.3)
Sodium: 142 mEq/L (ref 135–145)
Total Bilirubin: 0.6 mg/dL (ref 0.3–1.2)
Total Protein: 6.9 g/dL (ref 6.0–8.3)

## 2012-01-07 NOTE — Telephone Encounter (Signed)
Faxed to PCP

## 2012-01-07 NOTE — Telephone Encounter (Signed)
Quick Note:  Please let pt know his blood ct, liver & kidney function all look great! Thanks FG:[id number],BZMCE, MD  ______

## 2012-01-07 NOTE — Telephone Encounter (Signed)
Quick Note:  Pt aware of results.  Dawn, please cc pcp ______

## 2012-02-18 ENCOUNTER — Telehealth: Payer: Self-pay

## 2012-02-18 NOTE — Telephone Encounter (Signed)
Pt came by the office. He was concerned about paying for his lialda, he is going to have to pay 2000.00 for it. Asked pt if he received the patient assistance forms and he stated he wasn't sure, I informed him that I had sent them to him and his daughter. He tried to call her but was unable to reach her by phone. Gave pt another copy of patient assistance forms and #9 boxes of Lialda because pt stated he has been out for awhile. His daughter called him back and told him that she would fill out patient assistance forms and get them back to [place] asap.

## 2012-02-18 NOTE — Telephone Encounter (Signed)
Agree, noted.

## 2012-04-04 ENCOUNTER — Telehealth: Payer: Self-pay | Admitting: Urgent Care

## 2012-04-04 NOTE — Telephone Encounter (Signed)
Spoke w/ Dr Wolfgang Phoenix.  Pt having watery diarrhea multiple stools daily.  Cannot afford Lialda so not taking as Rx.  No recent abx. Plan: 1) Dr Wolfgang Phoenix to check c diff 2) Restart Lialda 2.4grams daily --pt to come by office & pick up samples for 1 month if we have.  Also, does he qualify for any pt assist programs? 3) Uceris 96m daily--may give pt samples as well 4) OV in 3 weeks here re: FU UC flare Thanks

## 2012-04-04 NOTE — Telephone Encounter (Signed)
Pt has tried to get patient assistance for lialda and he does not qualify. I spoke with his daughter recently and she was going to call the company and try to get them to reconsider. We do have samples and I can leave them at the front desk.   We do not have any samples of uceris.   Manuela Schwartz, please schedule ov. Thanks.

## 2012-04-04 NOTE — Telephone Encounter (Signed)
Pt is aware of OV on 3/24 at 1030 with AS

## 2012-04-04 NOTE — Telephone Encounter (Signed)
#  4 cartons of lialda at the front desk. #96 pills total.

## 2012-04-12 ENCOUNTER — Encounter: Payer: Self-pay | Admitting: *Deleted

## 2012-04-25 ENCOUNTER — Encounter: Payer: Self-pay | Admitting: Internal Medicine

## 2012-04-28 ENCOUNTER — Ambulatory Visit (INDEPENDENT_AMBULATORY_CARE_PROVIDER_SITE_OTHER): Payer: Medicare Other | Admitting: Gastroenterology

## 2012-04-28 ENCOUNTER — Encounter: Payer: Self-pay | Admitting: Gastroenterology

## 2012-04-28 VITALS — BP 116/64 | HR 81 | Temp 97.4°F | Ht 72.0 in | Wt 290.4 lb

## 2012-04-28 DIAGNOSIS — K5190 Ulcerative colitis, unspecified, without complications: Secondary | ICD-10-CM

## 2012-04-28 LAB — CBC WITH DIFFERENTIAL/PLATELET
Basophils Absolute: 0 10*3/uL (ref 0.0–0.1)
Basophils Relative: 0 % (ref 0–1)
Eosinophils Absolute: 0.1 10*3/uL (ref 0.0–0.7)
Eosinophils Relative: 1 % (ref 0–5)
HCT: 42.4 % (ref 39.0–52.0)
Hemoglobin: 13.9 g/dL (ref 13.0–17.0)
Lymphocytes Relative: 30 % (ref 12–46)
Lymphs Abs: 4.3 10*3/uL — ABNORMAL HIGH (ref 0.7–4.0)
MCH: 28.7 pg (ref 26.0–34.0)
MCHC: 32.8 g/dL (ref 30.0–36.0)
MCV: 87.6 fL (ref 78.0–100.0)
Monocytes Absolute: 1.3 10*3/uL — ABNORMAL HIGH (ref 0.1–1.0)
Monocytes Relative: 9 % (ref 3–12)
Neutro Abs: 8.7 10*3/uL — ABNORMAL HIGH (ref 1.7–7.7)
Neutrophils Relative %: 60 % (ref 43–77)
Platelets: 192 10*3/uL (ref 150–400)
RBC: 4.84 MIL/uL (ref 4.22–5.81)
RDW: 13.6 % (ref 11.5–15.5)
WBC: 14.4 10*3/uL — ABNORMAL HIGH (ref 4.0–10.5)

## 2012-04-28 LAB — BASIC METABOLIC PANEL
BUN: 17 mg/dL (ref 6–23)
CO2: 26 mEq/L (ref 19–32)
Calcium: 8.9 mg/dL (ref 8.4–10.5)
Chloride: 102 mEq/L (ref 96–112)
Creat: 1.07 mg/dL (ref 0.50–1.35)
Glucose, Bld: 117 mg/dL — ABNORMAL HIGH (ref 70–99)
Potassium: 3.5 mEq/L (ref 3.5–5.3)
Sodium: 139 mEq/L (ref 135–145)

## 2012-04-28 MED ORDER — BUDESONIDE 9 MG PO TB24: 1.0000 | ORAL_TABLET | Freq: Every day | ORAL | Status: DC

## 2012-04-28 NOTE — Patient Instructions (Addendum)
Continue taking Lialda.   I have sent in the prescription again for Uceris. We are calling the pharmacy to find out details regarding payment for this.   Please have stool sample done and return to our office as soon as possible.  Please have blood work done today.  Contact Dr. Wolfgang Phoenix for an appointment due to chest congestion and dizziness.  Follow a low-residue diet.  Further recommendations after we find out details from the pharmacy.

## 2012-04-28 NOTE — Progress Notes (Signed)
Referring Provider: [name], MD Primary Care Physician:  Sallee Lange, MD Primary Gastroenterologist: Dr. Gala Romney   Chief Complaint  Patient presents with  . Ulcerative Colitis Flare    HPI:   [name] is a pleasant 72 year old male presenting today with a history of UC. Last colonoscopy April 2012 with left-sided UC. Called in late Feb with likely flare. Taking samples of Lialda 1 tab 4 times a day. He is unsure dosage. Prescribed Uceris, but he is not taking this. States he had to pay 448 dollars up front. We contacted pharmacy, and it would be 300$ out of pocket. He is NOT able to use savings card for Uceris due to his insurance. Lialda out of pocket was going to be upwards of 300$. States he can't control his bowels. Sometimes just gas and liquid. Got up 4 times to go to bathroom. Didn't make it to bathroom this morning when he woke up. +rectal bleeding. No recent abx. States he did stool studies a few weeks ago. States stool was black as his shoes. Was taking something for his diarrhea at that time. No abdominal pain. Sounds like a bunch of elephants fighting.   Past Medical History  Diagnosis Date  . Hypertension   . DJD (degenerative joint disease)   . GERD (gastroesophageal reflux disease)   . Hyperlipidemia   . BPH (benign prostatic hyperplasia)   . Obstructive sleep apnea   . Diverticulosis   . Gastric ulcer 04/17/10    Three 36m gastric ulcers, H.pylori serologies were negative  . Clostridium difficile colitis 04/2005  . Idiopathic chronic inflammatory bowel disease 05/18/2010    left-sided UC  . S/P endoscopy 07/24/10    retained gastric contents, benign bx  . CAD (coronary artery disease) 01/1995  . Reflux 02/1995  . Colitis 2011  . Kidney stone     Past Surgical History  Procedure Laterality Date  . Colonoscopy  04/2005    granularity and friability erosions from rectum to 40cm. Bx infection vs IBD. C. Diff positive at the time.   . Knee surgery  two  .  Shoulder surgery  two  . Foot surgery  two  . Tonsillectomy    . Cervical spine surgery      C4-5  . Colonoscopy  05/2010    Rourk: left-sided UC, bx with no dysplasia, shallow diverticula  . Coronary stent placement  01/1995  . Esophagogastroduodenoscopy  07/24/2010    RNID:[id number]    Current Outpatient Prescriptions  Medication Sig Dispense Refill  . amLODipine (NORVASC) 10 MG tablet Take 1 tablet by mouth daily.      .Marland Kitchenaspirin 81 MG tablet Take 162 mg by mouth daily.        .Marland KitchenDIOVAN 160 MG tablet Take 1 tablet by mouth daily.      .Marland Kitchendoxazosin (CARDURA) 8 MG tablet Take by mouth. Take 1/2 tablet qd      . furosemide (LASIX) 40 MG tablet Take 1 tablet by mouth as needed.      .Marland Kitchenlosartan (COZAAR) 100 MG tablet Take 50 mg by mouth daily.      . mesalamine (LIALDA) 1.2 G EC tablet Take 2,400 mg by mouth daily. Take 4 tablet a day      . metoprolol (TOPROL-XL) 50 MG 24 hr tablet Take 150 mg by mouth daily.        .Marland KitchenNITROSTAT 0.4 MG SL tablet Take 1 tablet by mouth as needed.      .Marland Kitchen  omeprazole (PRILOSEC) 40 MG capsule Take 1 tablet by mouth daily.      Marland Kitchen oxybutynin (DITROPAN-XL) 10 MG 24 hr tablet Take 1 tablet by mouth daily.      . pravastatin (PRAVACHOL) 40 MG tablet Take 1 tablet by mouth daily.       No current facility-administered medications for this visit.    Allergies as of 04/28/2012  . (No Known Allergies)    Family History  Problem Relation Age of Onset  . Lung cancer Mother   . Cancer Mother     breast  . Diabetes Mother   . Stroke Father   . Hypertension Father   . Colon cancer Neg Hx     History   Social History  . Marital Status: Widowed    Spouse Name: [name]    Number of Children: [name]  . Years of Education: [name]   Occupational History  . maintenance     retired   Social History Main Topics  . Smoking status: Former Research scientist (life sciences)  . Smokeless tobacco: Never Used  . Alcohol Use: No  . Drug Use: No  . Sexually Active: Yes -- Male partner(s)     Birth Control/ Protection: None     Comment: spouse   Other Topics Concern  . None   Social History Narrative  . None    Review of Systems: Gen: +fatigue, weakness CV: +dizzy  Resp: +coughing up phlegm  GI: SEE HPI Derm: Denies rash, itching, dry skin Psych: Denies depression, anxiety, memory loss, confusion. No homicidal or suicidal ideation.  Heme: Denies bruising, bleeding, and enlarged lymph nodes.  Physical Exam: BP 116/64  Pulse 81  Temp(Src) 97.4 F (36.3 C) (Oral)  Ht 6' (1.829 m)  Wt 290 lb 6.4 oz (131.725 kg)  BMI 39.38 kg/m2 General:   Alert and oriented. No distress noted. Pleasant and cooperative.  Head:  Normocephalic and atraumatic. Eyes:  Conjuctiva clear without scleral icterus. Mouth:  Oral mucosa pink and moist. Good dentition. No lesions. Heart:  S1, S2 present without murmurs, rubs, or gallops. Regular rate and rhythm. Abdomen:  +BS, soft, non-tender and non-distended. Umbilical hernia noted. Msk:  Symmetrical without gross deformities. Normal posture. Extremities:  Without edema. Neurologic:  Alert and  oriented x4;  grossly normal neurologically. Skin:  Intact without significant lesions or rashes. Psych:  Alert and cooperative. Normal mood and affect.

## 2012-04-29 DIAGNOSIS — K5190 Ulcerative colitis, unspecified, without complications: Secondary | ICD-10-CM | POA: Insufficient documentation

## 2012-04-29 NOTE — Progress Notes (Signed)
Faxed to PCP

## 2012-04-29 NOTE — Assessment & Plan Note (Signed)
72 year old male presenting with history of UC, last colonoscopy April 2012. He called in late Feb with likely flare. Notes fecal incontinence, intermittent multiple loose stools, +low-volume hematochezia. It is very unfortunate that he is meeting obstacles at every turn just to obtain medication. He has been taking Lialda samples provided by our office, as his out of pocket expense would be close to 300$. Uceris was called in due to a flare, but he has not been able to obtain this, as his cost would be around 300$ as well. HE DOES NOT qualify for patient assistance with Lialda OR the savings card with Uceris. Appears to be in the "donut hole". He has a history of Cdiff colitis, and I would like to rule out infectious process.   CBC, BMP stat GI pathogen stool studies Will contact pharmacy to determine best route for treatment. May need to give Prednisone taper due to flare, but I would like to avoid this as possible. Would also like to see results of stool studies.

## 2012-04-30 ENCOUNTER — Ambulatory Visit (HOSPITAL_COMMUNITY)
Admission: RE | Admit: 2012-04-30 | Discharge: 2012-04-30 | Disposition: A | Discharge status: Home / Self Care | Payer: Medicare Other | Source: Ambulatory Visit | Attending: Family Medicine | Admitting: Family Medicine

## 2012-04-30 ENCOUNTER — Encounter: Payer: Self-pay | Admitting: Internal Medicine

## 2012-04-30 ENCOUNTER — Encounter: Payer: Self-pay | Admitting: Family Medicine

## 2012-04-30 ENCOUNTER — Other Ambulatory Visit: Payer: Self-pay

## 2012-04-30 ENCOUNTER — Ambulatory Visit (INDEPENDENT_AMBULATORY_CARE_PROVIDER_SITE_OTHER): Payer: Medicare Other | Admitting: Family Medicine

## 2012-04-30 ENCOUNTER — Telehealth: Payer: Self-pay | Admitting: Gastroenterology

## 2012-04-30 VITALS — BP 138/70 | Temp 98.9°F | Wt 293.2 lb

## 2012-04-30 DIAGNOSIS — R5381 Other malaise: Secondary | ICD-10-CM

## 2012-04-30 DIAGNOSIS — R042 Hemoptysis: Secondary | ICD-10-CM

## 2012-04-30 DIAGNOSIS — I25810 Atherosclerosis of coronary artery bypass graft(s) without angina pectoris: Secondary | ICD-10-CM

## 2012-04-30 DIAGNOSIS — R5383 Other fatigue: Secondary | ICD-10-CM

## 2012-04-30 DIAGNOSIS — R059 Cough, unspecified: Secondary | ICD-10-CM

## 2012-04-30 DIAGNOSIS — R05 Cough: Secondary | ICD-10-CM

## 2012-04-30 DIAGNOSIS — G4733 Obstructive sleep apnea (adult) (pediatric): Secondary | ICD-10-CM | POA: Insufficient documentation

## 2012-04-30 DIAGNOSIS — J441 Chronic obstructive pulmonary disease with (acute) exacerbation: Secondary | ICD-10-CM | POA: Insufficient documentation

## 2012-04-30 DIAGNOSIS — J449 Chronic obstructive pulmonary disease, unspecified: Secondary | ICD-10-CM | POA: Insufficient documentation

## 2012-04-30 LAB — CBC
HCT: 40.6 % (ref 39.0–52.0)
Hemoglobin: 13.4 g/dL (ref 13.0–17.0)
MCH: 28.9 pg (ref 26.0–34.0)
MCHC: 33 g/dL (ref 30.0–36.0)
MCV: 87.7 fL (ref 78.0–100.0)
Platelets: 208 10*3/uL (ref 150–400)
RBC: 4.63 MIL/uL (ref 4.22–5.81)
RDW: 13.4 % (ref 11.5–15.5)
WBC: 8.5 10*3/uL (ref 4.0–10.5)

## 2012-04-30 LAB — BASIC METABOLIC PANEL
BUN: 14 mg/dL (ref 6–23)
CO2: 28 mEq/L (ref 19–32)
Calcium: 8.8 mg/dL (ref 8.4–10.5)
Chloride: 104 mEq/L (ref 96–112)
Creat: 1.02 mg/dL (ref 0.50–1.35)
Glucose, Bld: 107 mg/dL — ABNORMAL HIGH (ref 70–99)
Potassium: 3.9 mEq/L (ref 3.5–5.3)
Sodium: 140 mEq/L (ref 135–145)

## 2012-04-30 MED ORDER — MESALAMINE 1000 MG RE SUPP: 1000.0000 mg | RECTAL | Status: DC

## 2012-04-30 MED ORDER — MESALAMINE 4 G RE ENEM: 4.0000 g | ENEMA | Freq: Every day | RECTAL | Status: DC

## 2012-04-30 MED ORDER — LEVOFLOXACIN 500 MG PO TABS: 500.0000 mg | ORAL_TABLET | Freq: Every day | ORAL | Status: AC

## 2012-04-30 NOTE — Patient Instructions (Signed)
Use antibiotics  If worse call to get rechecked  We will call you on Thursday for CT appointment

## 2012-04-30 NOTE — Progress Notes (Signed)
Patient needs appointment at 3:30 pm today.

## 2012-04-30 NOTE — Telephone Encounter (Signed)
Patient is aware I sent in Canasa suppositories and Rowasa enemas.   Do we have ANY samples of either one of these here? I want to be prepared in case he is unable to afford this.   Manuela Schwartz: please make an appt to be seen by RMR only in next 3-4 weeks. (or next available)

## 2012-04-30 NOTE — Progress Notes (Signed)
I want to avoid Prednisone in this gentleman due to his hx of PUD. EGD in 2012 showed healing of previous gastric ulcers. He is unable to afford his copay for Lialda due to being in the donut hole. We will continue to offer samples until finances improve.   Due to likely flare, will add Rowasa enemas at night and Canasa suppositories in the morning. This may be problematic financially as well. In meantime, will see if we have samples of either or both. If not, need to investigate patient assistance. He has not completed stool studies yet. Still concern for Cdiff.   I question if he may be better served with Simponi. Again, he has Medicare, and out of pocket expense may be an issue. However, in looking back over notes in epic, he has not had the best control of this disease in the past. Ultimately, I question if compliance due to finances may be a root issue.   Spoke with patient and asked him to submit stool studies as soon as possible. He has an appt with Dr. Wolfgang Phoenix today due to respiratory issues. I also informed him of the enemas and suppositories. Continue Lialda.  Needs to follow-up with RMR ONLY in next few weeks.

## 2012-04-30 NOTE — Addendum Note (Signed)
Addended by: Orvil Feil on: 04/30/2012 03:34 PM   Modules accepted: Orders

## 2012-04-30 NOTE — Progress Notes (Signed)
Subjective:     Patient ID: [name], male   DOB: 23-Nov-1940, 72 y.o.   MRN: [id number]  HPIthis patient has come in with several day history of head congestion drainage coughing now having chest congestion fever chills and coughing up blood. He denies shortness of breath he has a obese gentleman with multiple health problems he has a history of hypertension sleep apnea heart disease reflux and COPD he has a remote smoking history family history hypertension diabetes breast cancer review of systems please see above   Review of Systems     Objective:   Physical Exam Vital signs are stable neck no masses lungs cough is noted brings up bloody phlegm with some mucus in it heart regular not rest for distress abdomen soft obese extremities skin warm dry    Assessment:     Bronchitis with concern for pneumonia, hemoptysis     Plan:     CEA CBC chest x-ray came out looking good medications were ordered Levaquin daily for 10 days I believe this patient should have a CT scan of his chest because of his increased risk of lung cancer with hemoptysis. We will set up a CT of the chest with contrast.

## 2012-04-30 NOTE — Telephone Encounter (Signed)
Pt is aware of OV on 4/29 at 0930 with RMR and appt card was mailed

## 2012-05-01 ENCOUNTER — Encounter: Payer: Self-pay | Admitting: Family Medicine

## 2012-05-01 ENCOUNTER — Telehealth: Payer: Self-pay | Admitting: Family Medicine

## 2012-05-01 NOTE — Telephone Encounter (Signed)
We do have some of the Canasa suppositories but not to many

## 2012-05-01 NOTE — Telephone Encounter (Signed)
Prior Auth is done, chest CT with contrast is ready to be scheduled

## 2012-05-01 NOTE — Telephone Encounter (Signed)
CT chest with contrast scheduled for 05/05/2012 at 1:45pm. Patient notified.

## 2012-05-01 NOTE — Progress Notes (Signed)
Quick Note:  Leukocytosis noted. Being worked up for respiratory issues now. May be r/t respiratory +/- current GI presentation. Needs to return stool sample ASAP Needs to follow through with suppositories/enemas ordered. BUN/Cr look good. ______

## 2012-05-01 NOTE — Telephone Encounter (Signed)
Opened in error

## 2012-05-01 NOTE — Telephone Encounter (Signed)
Ok.  Please make a note to check on patient early next week. I want to make sure he picks up these prescriptions.

## 2012-05-05 ENCOUNTER — Ambulatory Visit (HOSPITAL_COMMUNITY)
Admission: RE | Admit: 2012-05-05 | Discharge: 2012-05-05 | Disposition: A | Discharge status: Home / Self Care | Payer: Medicare Other | Source: Ambulatory Visit | Attending: Family Medicine | Admitting: Family Medicine

## 2012-05-05 DIAGNOSIS — R042 Hemoptysis: Secondary | ICD-10-CM

## 2012-05-05 DIAGNOSIS — R918 Other nonspecific abnormal finding of lung field: Secondary | ICD-10-CM | POA: Insufficient documentation

## 2012-05-05 DIAGNOSIS — R079 Chest pain, unspecified: Secondary | ICD-10-CM | POA: Insufficient documentation

## 2012-05-05 MED ORDER — IOHEXOL 300 MG/ML  SOLN
80.0000 mL | Freq: Once | INTRAMUSCULAR | Status: AC | PRN
  Administered 2012-05-05: 80 mL via INTRAVENOUS

## 2012-05-05 NOTE — Telephone Encounter (Signed)
Pt came by today to get samples of the Canasa Suppositories

## 2012-05-08 NOTE — Telephone Encounter (Signed)
How is patient doing? Needs to submit the stool sample.

## 2012-05-09 ENCOUNTER — Encounter: Payer: Self-pay | Admitting: Family Medicine

## 2012-05-09 ENCOUNTER — Ambulatory Visit (INDEPENDENT_AMBULATORY_CARE_PROVIDER_SITE_OTHER): Payer: Medicare Other | Admitting: Family Medicine

## 2012-05-09 VITALS — BP 142/62 | HR 80 | Ht 72.0 in | Wt 290.0 lb

## 2012-05-09 DIAGNOSIS — J189 Pneumonia, unspecified organism: Secondary | ICD-10-CM | POA: Insufficient documentation

## 2012-05-09 DIAGNOSIS — K8020 Calculus of gallbladder without cholecystitis without obstruction: Secondary | ICD-10-CM | POA: Insufficient documentation

## 2012-05-09 NOTE — Progress Notes (Signed)
  Subjective:    Patient ID: [name], male    DOB: 07/21/1940, 72 y.o.   MRN: [id number]  HPI This patient was recently seen for pneumonia with hemoptysis he had a CAT scan done which showed pneumonia but no other growth he has not had any problem with hemoptysis since and no fever chills sweats no shortness of breath. Has remote history of smoking does have a personal history heart disease but he denies any angina symptoms. He also relates he has intermittent right upper quadrant discomfort after eating although it only happens maybe once every couple months. No vomiting with it no diarrhea no bloody stools. No dysuria.  Past medical history family history social history all reviewed.   Review of Systemspatient denies any headaches blurred vision denies excessive thirst denies it difficulty swallowing no shortness of breath no fever chills or sweats. Denies abdominal pain is set for 1 was noted above extremities denies edema     Objective:   Physical Exam Vital signs are stable. Neck no masses. Eardrums normal throat normal lungs are clear no crackles heart is regular abdomen soft obese no guarding rebound or tenderness.  CAT scan results were reviewed in detail with family and questions were answered.     Assessment & Plan:  #1-pneumonia-resolved. No need for further antibiotics. #2 abdominal discomfort with cholelithiasis-patient does need referral to Gen. Surgery. There is a possibility that he may need to do surgery but with him frequency of his symptoms they may just watch things. #3 abnormal CT scan-I. Do recommend a repeat CT scan in 3 months time with contrast we will notify patient of the scan and help set this up he will followup office visit after that to discuss #4 heart disease-stable. Risk factor management best approach.

## 2012-05-09 NOTE — Patient Instructions (Addendum)
If fevers call us  Follow up in three months after your scan  We will call you in July for your scan

## 2012-05-19 NOTE — Telephone Encounter (Signed)
Pt is feeling better now.

## 2012-05-29 NOTE — Progress Notes (Signed)
Late entry: Stool studies received and reviewed on 05-14-2022. Negative for Cdiff, Giardia, negative stool cultures.  HOWEVER: patient incidentally found to have H.pylori via stool studies (done with GI pathogen panel, which always checks for this as the standard). NOT Clarithromycin resistant.   THIS HAS NOT BEEN TREATED YET due to patient's multiple issues since his visit, respiratory issues, difficulty with finances for medication.   He is asymptomatic. Will need treatment in near future, sees Dr. Gala Romney on April 29th. This can be discussed with him at that visit.

## 2012-06-03 ENCOUNTER — Encounter: Payer: Self-pay | Admitting: General Practice

## 2012-06-03 ENCOUNTER — Ambulatory Visit (INDEPENDENT_AMBULATORY_CARE_PROVIDER_SITE_OTHER): Payer: Medicare Other | Admitting: Internal Medicine

## 2012-06-03 ENCOUNTER — Encounter: Payer: Self-pay | Admitting: Internal Medicine

## 2012-06-03 VITALS — BP 135/72 | HR 63 | Temp 98.2°F | Ht 72.0 in | Wt 290.2 lb

## 2012-06-03 DIAGNOSIS — K5190 Ulcerative colitis, unspecified, without complications: Secondary | ICD-10-CM

## 2012-06-03 NOTE — Progress Notes (Signed)
Primary Care Physician:  Sallee Lange, MD Primary Gastroenterologist:  Dr. Gala Romney  Pre-Procedure History & Physical: HPI:  [name] is a 72 y.o. male here for followup of left sided proctocolitis. Medication expenses impeded this gentleman's ability to take adequate doses of his medication. He usess meds   sparingly and sometimes only takes 2 a day. He continues to have intermittent diarrhea, blood per rectum and tenesmus. Occasional bouts of incontinence. He's not been on budesonide or other oral steroid preparation for UC because of expense. He does not want to take prednisone. Recent stool panel demonstrated no enteric pathogens. However, interestingly stool H. pylori antigen came back positive. He was already screened for H. pylori via ELISA serologies and gastric biopsies - both of which came back negative. CBC completely normal one month ago.  Past Medical History  Diagnosis Date  . Hypertension   . DJD (degenerative joint disease)   . GERD (gastroesophageal reflux disease)   . Hyperlipidemia   . BPH (benign prostatic hyperplasia)   . Obstructive sleep apnea   . Diverticulosis   . Gastric ulcer 04/17/10    Three 19m gastric ulcers, H.pylori serologies were negative  . Clostridium difficile colitis 04/2005  . Idiopathic chronic inflammatory bowel disease 05/18/2010    left-sided UC  . S/P endoscopy 07/24/10    retained gastric contents, benign bx  . CAD (coronary artery disease) 01/1995  . Reflux 02/1995  . Colitis 2011  . Kidney stone   . COPD (chronic obstructive pulmonary disease)     Past Surgical History  Procedure Laterality Date  . Colonoscopy  04/2005    granularity and friability erosions from rectum to 40cm. Bx infection vs IBD. C. Diff positive at the time.   . Knee surgery  two  . Shoulder surgery  two  . Foot surgery  two  . Tonsillectomy    . Cervical spine surgery      C4-5  . Colonoscopy  05/2010    Laverna Dossett: left-sided UC, bx with no dysplasia, shallow  diverticula  . Coronary stent placement  01/1995  . Esophagogastroduodenoscopy  07/24/2010    [id number]    Prior to Admission medications   Medication Sig Start Date End Date Taking? Authorizing Provider  amLODipine (NORVASC) 10 MG tablet Take 1 tablet by mouth daily. 04/18/10  Yes Historical Provider, MD  aspirin 81 MG tablet Take 162 mg by mouth daily.     Yes Historical Provider, MD  CANASA 1000 MG suppository Place 1,000 mg rectally at bedtime.  04/30/12  Yes Historical Provider, MD  DIOVAN 160 MG tablet Take 1 tablet by mouth daily. 04/18/10  Yes Historical Provider, MD  doxazosin (CARDURA) 8 MG tablet Take by mouth. Take 1/2 tablet qd 04/18/10  Yes Historical Provider, MD  furosemide (LASIX) 40 MG tablet Take 1 tablet by mouth as needed. 04/18/10  Yes Historical Provider, MD  losartan (COZAAR) 100 MG tablet Take 50 mg by mouth daily.   Yes Historical Provider, MD  mesalamine (LIALDA) 1.2 G EC tablet Take 2,400 mg by mouth daily. Take 4 tablet a day 12/26/11  Yes KAndria Meuse NP  mesalamine (ROWASA) 4 G enema Place 60 mLs (4 g total) rectally at bedtime. 04/30/12  Yes AOrvil Feil NP  metoprolol (TOPROL-XL) 50 MG 24 hr tablet Take 150 mg by mouth daily.     Yes Historical Provider, MD  NITROSTAT 0.4 MG SL tablet Take 1 tablet by mouth as needed. 04/18/10  Yes Historical Provider, MD  omeprazole (PRILOSEC) 40 MG capsule Take 1 tablet by mouth daily. 04/17/10  Yes Historical Provider, MD  oxybutynin (DITROPAN-XL) 10 MG 24 hr tablet Take 1 tablet by mouth daily. 04/18/10  Yes Historical Provider, MD  pravastatin (PRAVACHOL) 40 MG tablet Take 1 tablet by mouth daily. 04/18/10  Yes Historical Provider, MD  Budesonide 9 MG TB24 Take 1 capsule by mouth daily. 04/28/12   Orvil Feil, NP    Allergies as of 06/03/2012  . (No Known Allergies)    Family History  Problem Relation Age of Onset  . Lung cancer Mother   . Cancer Mother     breast  . Diabetes Mother   . Stroke Father   .  Hypertension Father   . Colon cancer Neg Hx     History   Social History  . Marital Status: Widowed    Spouse Name: [name]    Number of Children: [name]  . Years of Education: [name]   Occupational History  . maintenance     retired   Social History Main Topics  . Smoking status: Former Research scientist (life sciences)  . Smokeless tobacco: Never Used  . Alcohol Use: No  . Drug Use: No  . Sexually Active: Yes -- Male partner(s)    Birth Control/ Protection: None     Comment: spouse   Other Topics Concern  . Not on file   Social History Narrative  . No narrative on file    Review of Systems: See HPI, otherwise negative ROS  Physical Exam: BP 135/72  Pulse 63  Temp(Src) 98.2 F (36.8 C) (Oral)  Ht 6' (1.829 m)  Wt 290 lb 3.2 oz (131.634 kg)  BMI 39.35 kg/m2 General:   Alert,  Well-developed, well-nourished, elderly, obese pleasant and cooperative in NAD. Accompanied by wife Skin:  Intact without significant lesions or rashes. Eyes:  Sclera clear, no icterus.   Conjunctiva pink. Ears:  Normal auditory acuity. Nose:  No deformity, discharge,  or lesions. Mouth:  No deformity or lesions. Neck:  Supple; no masses or thyromegaly. No significant cervical adenopathy. Lungs:  Clear throughout to auscultation.   No wheezes, crackles, or rhonchi. No acute distress. Heart:  Regular rate and rhythm; no murmurs, clicks, rubs,  or gallops. Abdomen: Obese. Rotund. Positive bowel sounds soft and nontender without appreciable mass or organomegaly Pulses:  Normal pulses noted. Extremities:  Without clubbing or edema.  Impression/Plan:  Patient likely has active left sided proctocolitis. Inadequate medical therapy recently as outlined above. Not not mentioned above, his reflux symptoms well controlled on omeprazole. I am currently not convinced patient has an active H. pylori infection even though a stool antigen positive recently.  Recommendations: Resume Lialda - 4 tablets daily. He is to take 4 tablets  everyday without fail. I gave him a bag full of samples. Once he is out of the donut hole he should be able to get some on his own as well. If he runs short, he is to call and we will try to get him samples. In addition, we'll give him a three-week course of hydrocortisone enemas one per rectum at bedtime. May use Imodium sparingly as well.  Will consider alternative testing for assessment of H. pylori later in the year, once his inflammatory bowel disease is in remission.  Office visit here in 6 weeks.

## 2012-06-03 NOTE — Patient Instructions (Addendum)
Continue lialda 4 tablets daily  Hydrocortisone enema -  once at bedtime for 3 weeks  Office visit in 6 weeks  Use Imodium as needed for diarrhea

## 2012-06-03 NOTE — Progress Notes (Signed)
Per RMR pt needs a 6 wk f/u with as.  Pt has an appt 6/10@9 :30am

## 2012-06-11 ENCOUNTER — Encounter: Payer: Self-pay | Admitting: Family Medicine

## 2012-06-11 ENCOUNTER — Ambulatory Visit (INDEPENDENT_AMBULATORY_CARE_PROVIDER_SITE_OTHER): Payer: Medicare Other | Admitting: Family Medicine

## 2012-06-11 VITALS — BP 124/69 | HR 70 | Wt 292.3 lb

## 2012-06-11 DIAGNOSIS — J449 Chronic obstructive pulmonary disease, unspecified: Secondary | ICD-10-CM

## 2012-06-11 DIAGNOSIS — J189 Pneumonia, unspecified organism: Secondary | ICD-10-CM

## 2012-06-11 NOTE — Progress Notes (Signed)
  Subjective:    Patient ID: [name], male    DOB: Aug 19, 1940, 72 y.o.   MRN: [id number]  HPI Patient has history of Copd also has history of coronary artery disease was recently diagnosed with pneumonia. He was treated for this. The CAT scan report showed significant consolidation so it was recommended to have that area look at closer in 2 months time. So at the start of June he is do a noncontrast CAT scan he denies any hemoptysis fever or shortness of breath. He sees his cardiologist on yearly basis and takes his medicines as directed denies any chest pressure or tightness   Review of Systems See above.    Objective:   Physical Exam  Lungs are clear no crackles. Heart is regular. Pulses normal. Extremities no edema. Abdomen is soft. Neck no masses.      Assessment & Plan:  COPD-continue current measures. Recent pneumonia with abnormal CT scan-repeat CT scan of the chest noncontrast in early June and we will call him with the appointment Patient should followup with [place] again in 3 months time.

## 2012-06-11 NOTE — Patient Instructions (Signed)
We will call you in early June for your CT scan appointment. If you don't receive call then please call me [phone]  Follow up in August or Sept

## 2012-06-26 ENCOUNTER — Telehealth: Payer: Self-pay | Admitting: Family Medicine

## 2012-06-26 NOTE — Telephone Encounter (Signed)
May call in Augmentin [phone] one bid for 10 days

## 2012-06-26 NOTE — Telephone Encounter (Signed)
Augmentin 875 one bid for 10 days called into [facility] pt notified

## 2012-06-26 NOTE — Telephone Encounter (Signed)
Antibiotic is not working that he was given on 06/11/12.  Patient has the same symptoms as before.  [facility] in [place]

## 2012-06-26 NOTE — Telephone Encounter (Signed)
Patient stated that he is till coughing up green colored congestion.

## 2012-07-03 ENCOUNTER — Telehealth: Payer: Self-pay | Admitting: Internal Medicine

## 2012-07-03 NOTE — Telephone Encounter (Signed)
Patient is requesting more samples of Lialda he only has two pills left, please advise?

## 2012-07-03 NOTE — Telephone Encounter (Signed)
#  9 boxes of lialda samples left at the front desk

## 2012-07-03 NOTE — Telephone Encounter (Signed)
Pt is aware.  

## 2012-07-10 ENCOUNTER — Other Ambulatory Visit: Payer: Self-pay | Admitting: Family Medicine

## 2012-07-15 ENCOUNTER — Telehealth: Payer: Self-pay | Admitting: Gastroenterology

## 2012-07-15 ENCOUNTER — Ambulatory Visit: Payer: Medicare Other | Admitting: Gastroenterology

## 2012-07-15 NOTE — Telephone Encounter (Signed)
Pt was a no show

## 2012-07-15 NOTE — Telephone Encounter (Signed)
Please send note for f/u

## 2012-07-24 ENCOUNTER — Telehealth: Payer: Self-pay

## 2012-07-24 NOTE — Telephone Encounter (Signed)
Pt came by office requesting Lialda samples #12 boxes given. LW rescheduled missed appt.

## 2012-07-24 NOTE — Telephone Encounter (Signed)
noted 

## 2012-07-28 NOTE — Telephone Encounter (Signed)
Pt aware of OV on 7/3

## 2012-08-07 ENCOUNTER — Ambulatory Visit (INDEPENDENT_AMBULATORY_CARE_PROVIDER_SITE_OTHER): Payer: Medicare Other | Admitting: Gastroenterology

## 2012-08-07 ENCOUNTER — Encounter: Payer: Self-pay | Admitting: Gastroenterology

## 2012-08-07 VITALS — BP 136/77 | HR 70 | Temp 98.1°F | Ht 73.0 in | Wt 286.8 lb

## 2012-08-07 DIAGNOSIS — K5190 Ulcerative colitis, unspecified, without complications: Secondary | ICD-10-CM

## 2012-08-07 MED ORDER — DICYCLOMINE HCL 10 MG PO CAPS: 10.0000 mg | ORAL_CAPSULE | Freq: Three times a day (TID) | ORAL | Status: DC

## 2012-08-07 NOTE — Progress Notes (Signed)
Referring Provider: [name], MD Primary Care Physician:  Sallee Lange, MD Primary GI: Dr. Gala Romney   Chief Complaint  Patient presents with  . Follow-up    HPI:   [name] returns today in follow-up of left sided proctocolitis. Compliance with medications has been an issue due to financial expense, insurance coverage, and the donut hole. Lialda prescribed, which in the past he has only taken at 2 capsules per day due to cost. Unable to take Entocort due to expense. We have provided him with Lialda samples in the interim. Also, he was prescribed a 3-week course of hydrocortisone enemas nightly. H.pylori stool antigen positive, but this will be tested again later once IBD is under better control.   States he is seeing his Lialda pills in his stool. Ate at [employer] awhile ago, eating steak, potatoes, seven-layer pie, then had a bowel movement and saw green peppers in stool. Ate spaghetti, then a few hours later saw the spaghetti remnants. When he eats, feels like somebody is fighting. Prescribed enemas, still taking. Not taking Lialda pills as prescribed, because "then I just flush them down the toilet" (describing bowel movements). Taking Imodium prn. Has sharp pain in lower abdomen like a knife, lots of gas, then bowel habits. Sometimes has "slimy snot". Sometimes formed, sometimes loose.   Creamer in coffee. Eats a lot of cheese, thinking it will stop his bowels up. Eats ice cream. Drinks a lot of Sun Drop. NOT diet Sun Drop.    Past Medical History  Diagnosis Date  . Hypertension   . DJD (degenerative joint disease)   . GERD (gastroesophageal reflux disease)   . Hyperlipidemia   . BPH (benign prostatic hyperplasia)   . Obstructive sleep apnea   . Diverticulosis   . Gastric ulcer 04/17/10    Three 91m gastric ulcers, H.pylori serologies were negative  . Clostridium difficile colitis 04/2005  . Idiopathic chronic inflammatory bowel disease 05/18/2010    left-sided UC  . S/P  endoscopy 07/24/10    retained gastric contents, benign bx  . CAD (coronary artery disease) 01/1995  . Reflux 02/1995  . Colitis 2011  . Kidney stone   . COPD (chronic obstructive pulmonary disease)     Past Surgical History  Procedure Laterality Date  . Colonoscopy  04/2005    granularity and friability erosions from rectum to 40cm. Bx infection vs IBD. C. Diff positive at the time.   . Knee surgery  two  . Shoulder surgery  two  . Foot surgery  two  . Tonsillectomy    . Cervical spine surgery      C4-5  . Colonoscopy  05/2010    Rourk: left-sided UC, bx with no dysplasia, shallow diverticula  . Coronary stent placement  01/1995  . Esophagogastroduodenoscopy  07/24/2010    REZM:OQHUTMesophagus    Current Outpatient Prescriptions  Medication Sig Dispense Refill  . amLODipine (NORVASC) 10 MG tablet Take 1 tablet by mouth daily.      .Marland Kitchenaspirin 81 MG tablet Take 162 mg by mouth daily.        .Marland Kitchendoxazosin (CARDURA) 8 MG tablet Take by mouth. Take 1/2 tablet qd      . furosemide (LASIX) 40 MG tablet Take 1 tablet by mouth as needed.      .Marland Kitchenlosartan (COZAAR) 100 MG tablet Take 50 mg by mouth daily.      . mesalamine (LIALDA) 1.2 G EC tablet Take 2,400 mg by mouth daily. Take 4 tablet a  day      . metoprolol (LOPRESSOR) 50 MG tablet TAKE 1 TABLET 3 TIMES A DAY  90 tablet  5  . NITROSTAT 0.4 MG SL tablet Take 1 tablet by mouth as needed.      . pravastatin (PRAVACHOL) 40 MG tablet Take 1 tablet by mouth daily.      . Budesonide 9 MG TB24 Take 1 capsule by mouth daily.  30 tablet  1  . CANASA 1000 MG suppository Place 1,000 mg rectally at bedtime.       Marland Kitchen DIOVAN 160 MG tablet Take 1 tablet by mouth daily.      . mesalamine (ROWASA) 4 G enema Place 60 mLs (4 g total) rectally at bedtime.  31 Bottle  3  . omeprazole (PRILOSEC) 40 MG capsule Take 1 tablet by mouth daily.      Marland Kitchen oxybutynin (DITROPAN-XL) 10 MG 24 hr tablet Take 1 tablet by mouth daily.      Marland Kitchen triamcinolone cream (KENALOG)  0.1 %        No current facility-administered medications for this visit.    Allergies as of 08/07/2012  . (No Known Allergies)    Family History  Problem Relation Age of Onset  . Lung cancer Mother   . Cancer Mother     breast  . Diabetes Mother   . Stroke Father   . Hypertension Father   . Colon cancer Neg Hx     History   Social History  . Marital Status: Widowed    Spouse Name: [name]    Number of Children: [name]  . Years of Education: [name]   Occupational History  . maintenance     retired   Social History Main Topics  . Smoking status: Former Research scientist (life sciences)  . Smokeless tobacco: Never Used  . Alcohol Use: No  . Drug Use: No  . Sexually Active: Yes -- Male partner(s)    Birth Control/ Protection: None     Comment: spouse   Other Topics Concern  . None   Social History Narrative  . None    Review of Systems: Negative unless mentioned in HPI  Physical Exam: BP 136/77  Pulse 70  Temp(Src) 98.1 F (36.7 C) (Oral)  Ht 6' 1"  (1.854 m)  Wt 286 lb 12.8 oz (130.092 kg)  BMI 37.85 kg/m2 General:   Alert and oriented. No distress noted. Pleasant and cooperative.  Head:  Normocephalic and atraumatic. Eyes:  Conjuctiva clear without scleral icterus. Heart:  S1, S2 present without murmurs, rubs, or gallops. Regular rate and rhythm. Abdomen:  +BS, soft, obese, non-tender and non-distended. No rebound or guarding. No HSM or masses noted. Msk:  Symmetrical without gross deformities. Normal posture. Extremities:  Without edema. Neurologic:  Alert and  oriented x4;  grossly normal neurologically. Skin:  Intact without significant lesions or rashes. Psych:  Alert and cooperative. Normal mood and affect.

## 2012-08-07 NOTE — Patient Instructions (Addendum)
STOP SUN DROP for now.  Follow the lactose-free diet.  I have sent in a prescription called "Bentyl" to take. Take only 1 capsule with meals, up to 4 times a day. Watch for constipation, dry mouth, confusion.  Start taking a probiotic daily. We have provided Restora.  PLEASE take Lialda 4 capsules daily. We will see you back in 4 weeks!!        Lactose-Free Diet Lactose is a carbohydrate that is found mainly in milk and milk products, as well as in foods with added milk or whey. Lactose must be digested by the enzyme lactase in order to be used by the body. Lactose intolerance occurs when there is a shortage of lactase. When your body is not able to digest lactose, you may feel sick to your stomach (nausea), bloated, and have cramps, gas, and diarrhea. TYPES OF LACTASE DEFICIENCY  Primary lactase deficiency. This is the most common type. It is characterized by a slow decrease in lactase activity.  Secondary lactase deficiency. This occurs following injury to the small intestinal mucosa as a result of a disease or condition. It can also occur as a result of surgery or after treatment with antibiotic medicines or cancer drugs. Tolerance to lactose varies widely. Each person must determine how much milk can be consumed without developing symptoms. Drinking smaller portions of milk throughout the day may be helpful. Some studies suggest that slowing gastric emptying may help increase tolerance of milk products. This may be done by:  Consuming milk or milk products with a meal rather than alone.  Consuming milk with a higher fat content. There are many dairy products that may be tolerated better than milk by some people, including:  Cheese (especially aged cheese). The lactose content is much lower than in milk.  Cultured dairy products, such as yogurt, buttermilk, cottage cheese, and sweet acidophilus milk (kefir). These products are usually well tolerated by lactase-deficient people. This is  because the healthy bacteria help digest lactose.  Lactose-hydrolyzed milk. This product contains 40% to 90% less lactose than milk and may also be well tolerated. ADEQUACY These diets may be deficient in calcium, riboflavin, and vitamin D, according to the Recommended Dietary Allowances of the [employer]. Depending on individual tolerances and the use of milk substitutes, milk, or other dairy products, you may be able to meet these recommendations. SPECIAL NOTES  Lactose is a carbohydrate. The main food source for lactose is dairy products. Reading food labels is important. Many products contain lactose even when they are not made from milk. Look for the following words: whey, milk solids, dry milk solids, nonfat dry milk powder. Typical sources of lactose other than dairy products include breads, candies, cold cuts, prepared and processed foods, and commercial sauces and gravies.  All foods must be prepared without milk, cream, or other dairy foods.  A vitamin or mineral supplement may be necessary. Consult your caregiver or Registered Dietitian.  Lactose is also found in many prescription and over-the-counter medicines.  Soy milk and lactose-free supplements may be used as an alternative to milk. CHOOSING FOODS Breads and Starches  Allowed: Breads and rolls made without milk. [place], [place], or [place] bread. Soda crackers, graham crackers. Any crackers prepared without lactose. Cooked or dry cereals prepared without lactose (read labels). Any potatoes, pasta, or rice prepared without milk or lactose. Popcorn.  Avoid: Breads and rolls that contain milk. Prepared mixes such as muffins, biscuits, waffles, pancakes. Sweet rolls, donuts, [place] toast (if made with  milk or lactose). Zwieback crackers, corn curls, or any crackers that contain lactose. Cooked or dry cereals prepared with lactose (read labels). Instant potatoes, frozen [place] fries, scalloped or au gratin  potatoes. Vegetables  Allowed: Fresh, frozen, and canned vegetables.  Avoid: Creamed or breaded vegetables. Vegetables in a cheese sauce or with lactose-containing margarines. Fruit  Allowed: All fresh, canned, or frozen fruits that are not processed with lactose.  Avoid: Any canned or frozen fruits processed with lactose. Meat and Meat Substitutes  Allowed: Plain beef, chicken, fish, [place], lamb, veal, pork, or ham. Kosher prepared meat products. Strained or junior meats that do not contain milk. Eggs, soy meat substitutes, nuts.  Avoid: Scrambled eggs, omelets, and souffles that contain milk. Creamed or breaded meat, fish, or fowl. Sausage products such as wieners, liver sausage, or cold cuts that contain milk solids. Cheese, cottage cheese, or cheese spreads. Milk  Allowed: None.  Avoid: Milk (whole, 2%, skim, or chocolate). Evaporated, powdered, or condensed milk. Malted milk. Soups and Combination Foods  Allowed: Bouillon, broth, vegetable soups, clear soups, consomms. Homemade soups made with allowed ingredients. Combination or prepared foods that do not contain milk or milk products (read labels).  Avoid: Cream soups, chowders, commercially prepared soups containing lactose. Macaroni and cheese, pizza. Combination or prepared foods that contain milk or milk products. Desserts and Sweets  Allowed: Water and fruit ices, gelatin, angel food cake. Homemade cookies, pies, or cakes made from allowed ingredients. Pudding (if made with water or a milk substitute). Lactose-free tofu desserts. Sugar, honey, corn syrup, jam, jelly, marmalade, molasses (beet sugar). Pure sugar candy, marshmallows.  Avoid: Ice cream, ice milk, sherbet, custard, pudding, frozen yogurt. Commercial cake and cookie mixes. Desserts that contain chocolate. Pie crust made with milk-containing margarine. Reduced calorie desserts made with a sugar substitute that contains lactose. Toffee, peppermint, butterscotch,  chocolate, caramels. Fats and Oils  Allowed: Butter (as tolerated, contains very small amounts of lactose). Margarines and dressings that do not contain milk. Vegetable oils, shortening, mayonnaise, nondairy cream and whipped toppings without lactose or milk solids added. Berniece Salines.  Avoid: Margarines and salad dressings containing milk. Cream, cream cheese, peanut butter with added milk solids, sour cream, chip dips made with sour cream. Beverages  Allowed: Carbonated drinks, tea, coffee and freeze-dried coffee, some instant coffees (check labels). Fruit drinks, fruit and vegetable juice, rice or soy milk.  Avoid: Hot chocolate. Some cocoas, some instant coffees, instant iced teas, powdered fruit drinks (read labels). Condiments  Allowed: Soy sauce, carob powder, olives, gravy made with water, baker's cocoa, pickles, pure seasonings and spices, wine, pure monosodium glutamate, catsup, mustard.  Avoid: Some chewing gums, chocolate, some cocoas. Certain antibiotics and vitamin or mineral preparations. Spice blends if they contain milk products. MSG extender. Artificial sweeteners that contain lactose. Some nondairy creamers (read labels). SAMPLE MENU Breakfast  Orange juice.  Banana.  Bran cereal.  Nondairy creamer.  [place] bread, toasted.  Butter or milk-free margarine.  Coffee or tea. Lunch  Chicken breast.  Rice.  Green beans.  Butter or milk-free margarine.  Fresh melon.  Coffee or tea. Dinner  [employer].  Baked potato.  Butter or milk-free margarine.  Broccoli.  Lettuce salad with vinegar and oil dressing.  [employer]  Coffee or tea. Document Released: 07/14/2001 Document Revised: 04/16/2011 Document Reviewed: 04/21/2010 [facility] Patient Information 2014 [place], [place].

## 2012-08-10 NOTE — Assessment & Plan Note (Signed)
72 year old male with left-sided proctocolitis and difficulty with medication management due to finances. He is not taking Lialda as prescribed; as of note, stool studies negative. Question an element of IBS overlay plus dietary intake affecting overall symptoms. I'd like him to follow a lactose-free diet for now, stop Sun Drop, and start Bentyl at a low dose. Continue Lialda 4.8 grams daily. Add a probiotic. Return in 4 weeks.   As separate issue, recheck H.pylori status once IBD under better control (stool antigen incidentally found to be positive despite prior EGD with negative path).

## 2012-08-11 NOTE — Progress Notes (Signed)
Cc PCP 

## 2012-09-04 ENCOUNTER — Ambulatory Visit (INDEPENDENT_AMBULATORY_CARE_PROVIDER_SITE_OTHER): Payer: Medicare Other | Admitting: Gastroenterology

## 2012-09-04 ENCOUNTER — Encounter: Payer: Self-pay | Admitting: Gastroenterology

## 2012-09-04 VITALS — BP 112/58 | HR 64 | Temp 98.4°F | Ht 72.0 in | Wt 282.4 lb

## 2012-09-04 DIAGNOSIS — K5190 Ulcerative colitis, unspecified, without complications: Secondary | ICD-10-CM

## 2012-09-04 DIAGNOSIS — R1011 Right upper quadrant pain: Secondary | ICD-10-CM

## 2012-09-04 NOTE — Progress Notes (Signed)
Referring Provider: [name], MD Primary Care Physician:  Sallee Lange, MD Primary GI: Dr. Gala Romney   Chief Complaint  Patient presents with  . Follow-up    HPI:   72 year old male with left-sided proctocolitis and difficulty with medication management due to finances, presents today in follow-up. Bentyl started at last visit. Added probiotic.   Bentyl four times a day helped with cramping. Has had 4 loose stools this morning, can't control it at times. Taking Lialda 4 capsules a day. Occasional blood in stool. Has large hemorrhoid. Thinks maybe his gallbladder is causing this. Intentionally losing weight. Last Saturday went to bathroom 11 times.   Last colonoscopy April 2012.    Past Medical History  Diagnosis Date  . Hypertension   . DJD (degenerative joint disease)   . GERD (gastroesophageal reflux disease)   . Hyperlipidemia   . BPH (benign prostatic hyperplasia)   . Obstructive sleep apnea   . Diverticulosis   . Gastric ulcer 04/17/10    Three 25m gastric ulcers, H.pylori serologies were negative  . Clostridium difficile colitis 04/2005  . Idiopathic chronic inflammatory bowel disease 05/18/2010    left-sided UC  . S/P endoscopy 07/24/10    retained gastric contents, benign bx  . CAD (coronary artery disease) 01/1995  . Reflux 02/1995  . Colitis 2011  . Kidney stone   . COPD (chronic obstructive pulmonary disease)     Past Surgical History  Procedure Laterality Date  . Colonoscopy  04/2005    granularity and friability erosions from rectum to 40cm. Bx infection vs IBD. C. Diff positive at the time.   . Knee surgery  two  . Shoulder surgery  two  . Foot surgery  two  . Tonsillectomy    . Cervical spine surgery      C4-5  . Colonoscopy  05/2010    Rourk: left-sided UC, bx with no dysplasia, shallow diverticula  . Coronary stent placement  01/1995  . Esophagogastroduodenoscopy  07/24/2010    RQMV:HQIONGesophagus    Current Outpatient Prescriptions  Medication  Sig Dispense Refill  . amLODipine (NORVASC) 10 MG tablet Take 1 tablet by mouth daily.      .Marland Kitchenaspirin 81 MG tablet Take 162 mg by mouth daily.        .Marland Kitchendicyclomine (BENTYL) 10 MG capsule Take 1 capsule (10 mg total) by mouth 4 (four) times daily -  before meals and at bedtime.  120 capsule  3  . doxazosin (CARDURA) 8 MG tablet Take by mouth. Take 1/2 tablet qd      . furosemide (LASIX) 40 MG tablet Take 1 tablet by mouth as needed.      . hydrocortisone (CORTENEMA) 100 MG/60ML enema Place 100 mg rectally at bedtime.      .Marland Kitchenlosartan (COZAAR) 100 MG tablet Take 50 mg by mouth daily.      . mesalamine (LIALDA) 1.2 G EC tablet Take 2,400 mg by mouth daily. Take 4 tablet a day      . metoprolol (LOPRESSOR) 50 MG tablet TAKE 1 TABLET 3 TIMES A DAY  90 tablet  5  . NITROSTAT 0.4 MG SL tablet Take 1 tablet by mouth as needed.      .Marland Kitchenomeprazole (PRILOSEC) 40 MG capsule Take 1 tablet by mouth daily.      . pravastatin (PRAVACHOL) 40 MG tablet Take 1 tablet by mouth daily.       No current facility-administered medications for this visit.    Allergies as  of 09/04/2012  . (No Known Allergies)    Family History  Problem Relation Age of Onset  . Lung cancer Mother   . Cancer Mother     breast  . Diabetes Mother   . Stroke Father   . Hypertension Father   . Colon cancer Neg Hx     History   Social History  . Marital Status: Widowed    Spouse Name: [name]    Number of Children: [name]  . Years of Education: [name]   Occupational History  . maintenance     retired   Social History Main Topics  . Smoking status: Former Research scientist (life sciences)  . Smokeless tobacco: Never Used  . Alcohol Use: No  . Drug Use: No  . Sexually Active: Yes -- Male partner(s)    Birth Control/ Protection: None     Comment: spouse   Other Topics Concern  . None   Social History Narrative  . None    Review of Systems: Negative unless mentioned in HPI.   Physical Exam: BP 112/58  Pulse 64  Temp(Src) 98.4 F (36.9 C)  (Oral)  Ht 6' (1.829 m)  Wt 282 lb 6.4 oz (128.096 kg)  BMI 38.29 kg/m2 General:   Alert and oriented. No distress noted. Pleasant and cooperative.  Head:  Normocephalic and atraumatic. Heart:  S1, S2 present Abdomen:  +BS, soft, obese, question of hepatomegaly with RUQ tenderness on palpation Msk:  Symmetrical without gross deformities. Normal posture. Extremities:  Without edema. Neurologic:  Alert and  oriented x4;  grossly normal neurologically. Skin:  Intact without significant lesions or rashes. Psych:  Alert and cooperative. Normal mood and affect.

## 2012-09-04 NOTE — Patient Instructions (Addendum)
Please have blood work done today. We have also set you up for a CT scan of your belly.   Increase the Bentyl to 2 capsules with breakfast and 2 with dinner. Keep only 1 at lunch and the evening.   Please complete the stool sample.  We will see you back in 3 months!

## 2012-09-04 NOTE — Assessment & Plan Note (Signed)
Persistent loose stool with mild improvement adding Bentyl and probiotic. Occasional fecal incontinence noted. Not mentioned above, he has cut out dairy, and he was applauded on this. Slow weight loss noted, and he states this is intentional. IBD not ideally controlled due to inability to obtain medications (Entocort or Uceris) due to "donut hole". With his history of Cdiff, I would like to recheck Cdiff PCR again, as he was on antibiotics not too long ago. Increase Bentyl to 20 mg at breakfast and dinner, 10 mg at lunch and bedtime. We will attempt to obtain patient assistance for Uceris, providing samples if needed, in the hopes of getting his symptoms under control/remission.

## 2012-09-04 NOTE — Assessment & Plan Note (Signed)
New RUQ pain with palpation, and I question hepatomegaly on exam. Gallbladder remains in situ. Known gallstones. Patient attributes weight loss to dietary changes. However, I would like to obtain an updated HFP, BMP, and proceed with CT in near future.

## 2012-09-06 LAB — BASIC METABOLIC PANEL
BUN: 10 mg/dL (ref 6–23)
CO2: 32 mEq/L (ref 19–32)
Calcium: 9.1 mg/dL (ref 8.4–10.5)
Chloride: 106 mEq/L (ref 96–112)
Creat: 0.88 mg/dL (ref 0.50–1.35)
Glucose, Bld: 94 mg/dL (ref 70–99)
Potassium: 4.4 mEq/L (ref 3.5–5.3)
Sodium: 142 mEq/L (ref 135–145)

## 2012-09-06 LAB — HEPATIC FUNCTION PANEL
ALT: 13 U/L (ref 0–53)
AST: 17 U/L (ref 0–37)
Albumin: 3.8 g/dL (ref 3.5–5.2)
Alkaline Phosphatase: 83 U/L (ref 39–117)
Bilirubin, Direct: 0.2 mg/dL (ref 0.0–0.3)
Indirect Bilirubin: 0.7 mg/dL (ref 0.0–0.9)
Total Bilirubin: 0.9 mg/dL (ref 0.3–1.2)
Total Protein: 6.9 g/dL (ref 6.0–8.3)

## 2012-09-08 LAB — CLOSTRIDIUM DIFFICILE BY PCR: Toxigenic C. Difficile by PCR: NOT DETECTED

## 2012-09-09 ENCOUNTER — Encounter (HOSPITAL_COMMUNITY): Payer: Self-pay

## 2012-09-09 ENCOUNTER — Ambulatory Visit (HOSPITAL_COMMUNITY)
Admission: RE | Admit: 2012-09-09 | Discharge: 2012-09-09 | Disposition: A | Discharge status: Home / Self Care | Payer: Medicare Other | Source: Ambulatory Visit | Attending: Gastroenterology | Admitting: Gastroenterology

## 2012-09-09 DIAGNOSIS — K8020 Calculus of gallbladder without cholecystitis without obstruction: Secondary | ICD-10-CM | POA: Insufficient documentation

## 2012-09-09 DIAGNOSIS — R197 Diarrhea, unspecified: Secondary | ICD-10-CM | POA: Insufficient documentation

## 2012-09-09 DIAGNOSIS — K921 Melena: Secondary | ICD-10-CM | POA: Insufficient documentation

## 2012-09-09 DIAGNOSIS — N200 Calculus of kidney: Secondary | ICD-10-CM | POA: Insufficient documentation

## 2012-09-09 DIAGNOSIS — R1011 Right upper quadrant pain: Secondary | ICD-10-CM

## 2012-09-09 MED ORDER — IOHEXOL 300 MG/ML  SOLN: 50.0000 mL | Freq: Once | INTRAMUSCULAR | Status: AC | PRN

## 2012-09-09 MED ORDER — IOHEXOL 300 MG/ML  SOLN
100.0000 mL | Freq: Once | INTRAMUSCULAR | Status: AC | PRN
  Administered 2012-09-09: 100 mL via INTRAVENOUS

## 2012-09-10 ENCOUNTER — Telehealth: Payer: Self-pay | Admitting: *Deleted

## 2012-09-10 NOTE — Telephone Encounter (Signed)
Pt called about getting samples, pt does not know the name of it. Please advise [phone]

## 2012-09-10 NOTE — Progress Notes (Signed)
Quick Note:  Cdiff PCR is negative.  HFP is normal.  CT showed Gallstones without other evidence for acute cholecystitis. No acute intra-abdominal or pelvic pathology. If continued RUQ discomfort, let's proceed with HIDA.  How is patient doing since increasing Bentyl? What was the decision regarding patient assistance for Uceris? If we can be provided enough samples to give him an 8 week course with taper thereafter, I would like to try it. ______

## 2012-09-11 NOTE — Progress Notes (Signed)
Cc PCP 

## 2012-09-15 ENCOUNTER — Other Ambulatory Visit: Payer: Self-pay | Admitting: Gastroenterology

## 2012-09-15 DIAGNOSIS — R197 Diarrhea, unspecified: Secondary | ICD-10-CM

## 2012-09-15 DIAGNOSIS — R1011 Right upper quadrant pain: Secondary | ICD-10-CM

## 2012-09-15 NOTE — Telephone Encounter (Signed)
Gave #4 boxes of lialda.

## 2012-09-16 NOTE — Telephone Encounter (Signed)
noted 

## 2012-09-17 ENCOUNTER — Encounter (HOSPITAL_COMMUNITY)
Admission: RE | Admit: 2012-09-17 | Discharge: 2012-09-17 | Disposition: A | Discharge status: Home / Self Care | Payer: Medicare Other | Source: Ambulatory Visit | Attending: Gastroenterology | Admitting: Gastroenterology

## 2012-09-17 ENCOUNTER — Encounter (HOSPITAL_COMMUNITY): Payer: Self-pay

## 2012-09-17 DIAGNOSIS — R197 Diarrhea, unspecified: Secondary | ICD-10-CM

## 2012-09-17 DIAGNOSIS — R1011 Right upper quadrant pain: Secondary | ICD-10-CM | POA: Insufficient documentation

## 2012-09-17 HISTORY — DX: Unspecified asthma, uncomplicated: J45.909

## 2012-09-17 MED ORDER — TECHNETIUM TC 99M MEBROFENIN IV KIT
5.0000 | PACK | Freq: Once | INTRAVENOUS | Status: AC | PRN
  Administered 2012-09-17: 5 via INTRAVENOUS

## 2012-09-24 NOTE — Progress Notes (Signed)
Quick Note:  HIDA demonstrates severe dysfunction of gallbladder. Let's try to get his IBD under better control before referring for elective cholecystectomy to General Surgery.  He needs to follow a low-fat diet. Avoid fatty, greasy foods.   Let's do Uceris 9 mg ( in addition to Lialda) X 8 weeks.  Please provide the samples we have. Hopefully, this can get things under control.  Return in 4-6 weeks ______

## 2012-09-25 ENCOUNTER — Telehealth: Payer: Self-pay | Admitting: *Deleted

## 2012-09-25 NOTE — Telephone Encounter (Signed)
Pt called wanting results, please advise [phone]

## 2012-09-25 NOTE — Progress Notes (Signed)
Pt's appt. Is 11-01-12 at 9:30

## 2012-09-25 NOTE — Telephone Encounter (Signed)
Pt is aware of results. 

## 2012-10-27 ENCOUNTER — Ambulatory Visit (INDEPENDENT_AMBULATORY_CARE_PROVIDER_SITE_OTHER): Payer: Medicare Other | Admitting: Gastroenterology

## 2012-10-27 ENCOUNTER — Encounter: Payer: Self-pay | Admitting: Gastroenterology

## 2012-10-27 VITALS — BP 150/73 | HR 63 | Temp 97.8°F | Ht 70.0 in | Wt 278.8 lb

## 2012-10-27 DIAGNOSIS — R1011 Right upper quadrant pain: Secondary | ICD-10-CM

## 2012-10-27 DIAGNOSIS — K8020 Calculus of gallbladder without cholecystitis without obstruction: Secondary | ICD-10-CM

## 2012-10-27 DIAGNOSIS — K5190 Ulcerative colitis, unspecified, without complications: Secondary | ICD-10-CM

## 2012-10-27 MED ORDER — MESALAMINE 1.2 G PO TBEC: 4800.0000 mg | DELAYED_RELEASE_TABLET | Freq: Every day | ORAL | Status: DC

## 2012-10-27 NOTE — Assessment & Plan Note (Signed)
72 year old gentleman with history of left-sided ulcerative colitis diagnosed in 2012, ongoing daily diarrhea for the past 9 months. Management has been difficult to do to financial issues. He has had difficulty obtaining his medication and therefore at times has limited the use of "stretched out his prescription". Some of his confusion also comes from some degree of illiteracy. Up until 2 weeks ago he states he was taking his Lialda 2 in the morning and 2 in the evening. Unfortunately he doubled the Uceris on his own and tells me he has noted no improvement. Recently negative for C. difficile. He does over last time he was on Bentyl. Continues to have 4-8 liquid the stools daily with several episodes of fecal incontinence which is disrupting his job performance.  1. Start Uceris taper. Samples provided. 2. Resume Lialda four daily. Samples provided. Patient is not sure how much his last RX cost, ?$40. We will check on this for him.  3. To discuss further management with Dr. Gala Romney. May need to have repeat colonoscopy to evaluate how active his disease is.

## 2012-10-27 NOTE — Assessment & Plan Note (Signed)
Dysfunctional gallbladder, gallstones. Patient denies postprandial abdominal pain. Some right upper quadrant pain to palpation on exam. Would like to get UC under better management prior to elective cholecystectomy. Patient will monitor symptoms and let us know if develops RUQ pain.

## 2012-10-27 NOTE — Patient Instructions (Addendum)
1. We need to taper you off of Uceris. Starting tomorrow, take ONE tablet each day. You will do this for TWO weeks. After that, you will take ONE every other day until you run out of medication. 2. Continue Lialda, four tablets each day. Samples given. Prescription sent to your pharmacy.

## 2012-10-27 NOTE — Progress Notes (Signed)
CC'd to PCP 

## 2012-10-27 NOTE — Progress Notes (Addendum)
Primary Care Physician: Sallee Lange, MD  Primary Gastroenterologist:  Garfield Cornea, MD   Chief Complaint  Patient presents with  . Follow-up    HPI: [name] is a 72 y.o. male here for followup visit. Originally diagnosed with ulcerative colitis, left-sided back in 2012. Initially started on relates in a months but do to difficulty with administering himself he was gradually placed on oral mesalamine in way of Lialda. Has had ongoing flare since around March of this year. He has had great difficulty obtaining his medication on a regular basis due to expense. Previously did not qualify for patient's assistance.   He was last seen in July 2014.  C. difficile PCR was negative. Due to new right upper quarter pain he had LFTs which were normal. CT scan showed gallstones but no other evidence of acute cholecystitis. HIDA scan showed severe dysfunction the gallbladder, no measurable injected radiotracer from the gallbladder. Patient did not experience symptoms. Plans were to get his UC under better control prior to elective cholecystectomy. He was placed on Uceris 9 mg daily in addition to [place]. Given 8 weeks of Uceris samples as he is in the donut hole. Patient states that it didn't seem to be working so he increased it to 2 a day and now he is running short on his medication. He also stopped taking the Lialda two weeks ago. He is not sure whether he has been taking Bentyl at all.   BM 4-8 times per day. Cannot remember the last time he had a solid stool. States he's been having persistent diarrhea at least for the past 9 months. Back on dairy now that he determined that it didn't seem to help when he avoided it.  Drank 1/2 gallon of milk. Bottom is raw from all the diarrhea, trying over-the-counter hemorrhoid creams. Lots of bowel sounds. No abdominal pain. No vomiting. No heartburn. PP fecal urgency. Positive fecal incontinence. No rectal bleeding.  C/O urinary hesitancy, nocturnal  urination. No nocturnal diarrhea.  Current Outpatient Prescriptions  Medication Sig Dispense Refill  . amLODipine (NORVASC) 10 MG tablet Take 1 tablet by mouth daily.      Marland Kitchen aspirin 81 MG tablet Take 162 mg by mouth daily.        . Budesonide (UCERIS) 9 MG TB24 Take 9 mg by mouth daily.      Marland Kitchen doxazosin (CARDURA) 8 MG tablet Take by mouth. Take 1/2 tablet qd      . losartan (COZAAR) 100 MG tablet Take 50 mg by mouth daily.      . metoprolol (LOPRESSOR) 50 MG tablet TAKE 1 TABLET 3 TIMES A DAY  90 tablet  5  . NITROSTAT 0.4 MG SL tablet Take 1 tablet by mouth as needed.      Marland Kitchen omeprazole (PRILOSEC) 40 MG capsule Take 1 tablet by mouth daily.      . pravastatin (PRAVACHOL) 40 MG tablet Take 1 tablet by mouth daily.       No current facility-administered medications for this visit.    Allergies as of 10/27/2012  . (No Known Allergies)   Past Medical History  Diagnosis Date  . Hypertension   . DJD (degenerative joint disease)   . GERD (gastroesophageal reflux disease)   . Hyperlipidemia   . BPH (benign prostatic hyperplasia)   . Obstructive sleep apnea   . Diverticulosis   . Gastric ulcer 04/17/10    Three 53m gastric ulcers, H.pylori serologies were negative  . Clostridium difficile colitis 04/2005  .  Idiopathic chronic inflammatory bowel disease 05/18/2010    left-sided UC  . S/P endoscopy 07/24/10    retained gastric contents, benign bx  . CAD (coronary artery disease) 01/1995  . Reflux 02/1995  . Colitis 2011  . Kidney stone   . COPD (chronic obstructive pulmonary disease)   . Asthma    Past Surgical History  Procedure Laterality Date  . Colonoscopy  04/2005    granularity and friability erosions from rectum to 40cm. Bx infection vs IBD. C. Diff positive at the time.   . Knee surgery  two  . Shoulder surgery  two  . Foot surgery  two  . Tonsillectomy    . Cervical spine surgery      C4-5  . Colonoscopy  05/2010    Rourk: left-sided UC, bx with no dysplasia, shallow  diverticula  . Coronary stent placement  01/1995  . Esophagogastroduodenoscopy  07/24/2010    [id number] esophagus   Family History  Problem Relation Age of Onset  . Lung cancer Mother   . Cancer Mother     breast  . Diabetes Mother   . Stroke Father   . Hypertension Father   . Colon cancer Neg Hx    History   Social History  . Marital Status: Widowed    Spouse Name: [name]    Number of Children: [name]  . Years of Education: [name]   Occupational History  . maintenance     retired   Social History Main Topics  . Smoking status: Former Research scientist (life sciences)  . Smokeless tobacco: Never Used  . Alcohol Use: No  . Drug Use: No  . Sexual Activity: Yes    Partners: Female    Patent examiner Protection: None     Comment: spouse   Other Topics Concern  . None   Social History Narrative  . None    ROS:  General: Negative for anorexia, weight loss, fever, chills, fatigue, weakness. ENT: Negative for hoarseness, difficulty swallowing , nasal congestion. CV: Negative for chest pain, angina, palpitations, dyspnea on exertion, peripheral edema.  Respiratory: Negative for dyspnea at rest, dyspnea on exertion, cough, sputum, wheezing.  GI: See history of present illness. GU:  Negative for dysuria, hematuria, urinary incontinence. See history of present illness.  Endo: Negative for unusual weight change.    Physical Examination:   BP 150/73  Pulse 63  Temp(Src) 97.8 F (36.6 C) (Oral)  Ht 5' 10"  (1.778 m)  Wt 278 lb 12.8 oz (126.463 kg)  BMI 40 kg/m2  General: Well-nourished, well-developed in no acute distress.  Eyes: No icterus. Mouth: Oropharyngeal mucosa moist and pink, no lesions erythema or exudate. Lungs: Clear to auscultation bilaterally.  Heart: Regular rate and rhythm, no murmurs rubs or gallops.  Abdomen: Bowel sounds are normal, mild to moderate right upper quadrant tenderness, nondistended, no hepatosplenomegaly or masses, no abdominal bruits or hernia , no rebound or  guarding.   Extremities: No lower extremity edema. No clubbing or deformities. Neuro: Alert and oriented x 4   Skin: Warm and dry, no jaundice.   Psych: Alert and cooperative, normal mood and affect.

## 2012-11-03 NOTE — Progress Notes (Signed)
Please let patient know that Dr. Gala Romney recommends ileocolonoscopy to evaluate ongoing diarrhea and evaluate UC.   Please schedule TCS/TI with RMR.

## 2012-11-04 ENCOUNTER — Other Ambulatory Visit: Payer: Self-pay | Admitting: Internal Medicine

## 2012-11-04 DIAGNOSIS — R197 Diarrhea, unspecified: Secondary | ICD-10-CM

## 2012-11-04 MED ORDER — PEG 3350-KCL-NA BICARB-NACL 420 G PO SOLR: 4000.0000 mL | ORAL | Status: DC

## 2012-11-04 NOTE — Progress Notes (Signed)
Per Serena Colonel pt is schedule for 11/07/12@12 :15pm  Pt is coming to pick up prep instructions

## 2012-11-05 ENCOUNTER — Encounter (HOSPITAL_COMMUNITY): Payer: Self-pay | Admitting: Pharmacy Technician

## 2012-11-07 ENCOUNTER — Encounter (HOSPITAL_COMMUNITY)
Admission: RE | Disposition: A | Discharge status: Home / Self Care | Payer: Self-pay | Source: Ambulatory Visit | Attending: Internal Medicine

## 2012-11-07 ENCOUNTER — Encounter (HOSPITAL_COMMUNITY): Payer: Self-pay | Admitting: *Deleted

## 2012-11-07 ENCOUNTER — Ambulatory Visit (HOSPITAL_COMMUNITY)
Admission: RE | Admit: 2012-11-07 | Discharge: 2012-11-07 | Disposition: A | Discharge status: Home / Self Care | Payer: Medicare Other | Source: Ambulatory Visit | Attending: Internal Medicine | Admitting: Internal Medicine

## 2012-11-07 DIAGNOSIS — R197 Diarrhea, unspecified: Secondary | ICD-10-CM | POA: Insufficient documentation

## 2012-11-07 DIAGNOSIS — J4489 Other specified chronic obstructive pulmonary disease: Secondary | ICD-10-CM | POA: Insufficient documentation

## 2012-11-07 DIAGNOSIS — I10 Essential (primary) hypertension: Secondary | ICD-10-CM | POA: Insufficient documentation

## 2012-11-07 DIAGNOSIS — K625 Hemorrhage of anus and rectum: Secondary | ICD-10-CM | POA: Insufficient documentation

## 2012-11-07 DIAGNOSIS — K5120 Ulcerative (chronic) proctitis without complications: Secondary | ICD-10-CM

## 2012-11-07 DIAGNOSIS — J449 Chronic obstructive pulmonary disease, unspecified: Secondary | ICD-10-CM | POA: Insufficient documentation

## 2012-11-07 DIAGNOSIS — K5289 Other specified noninfective gastroenteritis and colitis: Secondary | ICD-10-CM | POA: Insufficient documentation

## 2012-11-07 DIAGNOSIS — D126 Benign neoplasm of colon, unspecified: Secondary | ICD-10-CM | POA: Insufficient documentation

## 2012-11-07 HISTORY — PX: COLONOSCOPY: SHX5424

## 2012-11-07 LAB — CLOSTRIDIUM DIFFICILE BY PCR: Toxigenic C. Difficile by PCR: NEGATIVE

## 2012-11-07 SURGERY — COLONOSCOPY
Anesthesia: Moderate Sedation

## 2012-11-07 MED ORDER — MEPERIDINE HCL 100 MG/ML IJ SOLN
INTRAMUSCULAR | Status: DC | PRN
  Administered 2012-11-07: 50 mg via INTRAVENOUS

## 2012-11-07 MED ORDER — SODIUM CHLORIDE 0.9 % IV SOLN
INTRAVENOUS | Status: DC
  Administered 2012-11-07: 1000 mL via INTRAVENOUS

## 2012-11-07 MED ORDER — MIDAZOLAM HCL 5 MG/5ML IJ SOLN
INTRAMUSCULAR | Status: AC
  Filled 2012-11-07: qty 10

## 2012-11-07 MED ORDER — ONDANSETRON HCL 4 MG/2ML IJ SOLN
INTRAMUSCULAR | Status: DC | PRN
  Administered 2012-11-07: 4 mg via INTRAVENOUS

## 2012-11-07 MED ORDER — MEPERIDINE HCL 100 MG/ML IJ SOLN
INTRAMUSCULAR | Status: AC
  Filled 2012-11-07: qty 2

## 2012-11-07 MED ORDER — SIMETHICONE 40 MG/0.6ML PO SUSP
ORAL | Status: AC
  Filled 2012-11-07: qty 0.6

## 2012-11-07 MED ORDER — ONDANSETRON HCL 4 MG/2ML IJ SOLN
INTRAMUSCULAR | Status: AC
  Filled 2012-11-07: qty 2

## 2012-11-07 MED ORDER — MIDAZOLAM HCL 5 MG/5ML IJ SOLN
INTRAMUSCULAR | Status: DC | PRN
  Administered 2012-11-07: 1 mg via INTRAVENOUS
  Administered 2012-11-07: 2 mg via INTRAVENOUS

## 2012-11-07 NOTE — H&P (View-Only) (Signed)
Primary Care Physician: Sallee Lange, MD  Primary Gastroenterologist:  Garfield Cornea, MD   Chief Complaint  Patient presents with  . Follow-up    HPI: [name] is a 72 y.o. male here for followup visit. Originally diagnosed with ulcerative colitis, left-sided back in 2012. Initially started on relates in a months but do to difficulty with administering himself he was gradually placed on oral mesalamine in way of Lialda. Has had ongoing flare since around March of this year. He has had great difficulty obtaining his medication on a regular basis due to expense. Previously did not qualify for patient's assistance.   He was last seen in July 2014.  C. difficile PCR was negative. Due to new right upper quarter pain he had LFTs which were normal. CT scan showed gallstones but no other evidence of acute cholecystitis. HIDA scan showed severe dysfunction the gallbladder, no measurable injected radiotracer from the gallbladder. Patient did not experience symptoms. Plans were to get his UC under better control prior to elective cholecystectomy. He was placed on Uceris 9 mg daily in addition to [place]. Given 8 weeks of Uceris samples as he is in the donut hole. Patient states that it didn't seem to be working so he increased it to 2 a day and now he is running short on his medication. He also stopped taking the Lialda two weeks ago. He is not sure whether he has been taking Bentyl at all.   BM 4-8 times per day. Cannot remember the last time he had a solid stool. States he's been having persistent diarrhea at least for the past 9 months. Back on dairy now that he determined that it didn't seem to help when he avoided it.  Drank 1/2 gallon of milk. Bottom is raw from all the diarrhea, trying over-the-counter hemorrhoid creams. Lots of bowel sounds. No abdominal pain. No vomiting. No heartburn. PP fecal urgency. Positive fecal incontinence. No rectal bleeding.  C/O urinary hesitancy, nocturnal  urination. No nocturnal diarrhea.  Current Outpatient Prescriptions  Medication Sig Dispense Refill  . amLODipine (NORVASC) 10 MG tablet Take 1 tablet by mouth daily.      Marland Kitchen aspirin 81 MG tablet Take 162 mg by mouth daily.        . Budesonide (UCERIS) 9 MG TB24 Take 9 mg by mouth daily.      Marland Kitchen doxazosin (CARDURA) 8 MG tablet Take by mouth. Take 1/2 tablet qd      . losartan (COZAAR) 100 MG tablet Take 50 mg by mouth daily.      . metoprolol (LOPRESSOR) 50 MG tablet TAKE 1 TABLET 3 TIMES A DAY  90 tablet  5  . NITROSTAT 0.4 MG SL tablet Take 1 tablet by mouth as needed.      Marland Kitchen omeprazole (PRILOSEC) 40 MG capsule Take 1 tablet by mouth daily.      . pravastatin (PRAVACHOL) 40 MG tablet Take 1 tablet by mouth daily.       No current facility-administered medications for this visit.    Allergies as of 10/27/2012  . (No Known Allergies)   Past Medical History  Diagnosis Date  . Hypertension   . DJD (degenerative joint disease)   . GERD (gastroesophageal reflux disease)   . Hyperlipidemia   . BPH (benign prostatic hyperplasia)   . Obstructive sleep apnea   . Diverticulosis   . Gastric ulcer 04/17/10    Three 64m gastric ulcers, H.pylori serologies were negative  . Clostridium difficile colitis 04/2005  .  Idiopathic chronic inflammatory bowel disease 05/18/2010    left-sided UC  . S/P endoscopy 07/24/10    retained gastric contents, benign bx  . CAD (coronary artery disease) 01/1995  . Reflux 02/1995  . Colitis 2011  . Kidney stone   . COPD (chronic obstructive pulmonary disease)   . Asthma    Past Surgical History  Procedure Laterality Date  . Colonoscopy  04/2005    granularity and friability erosions from rectum to 40cm. Bx infection vs IBD. C. Diff positive at the time.   . Knee surgery  two  . Shoulder surgery  two  . Foot surgery  two  . Tonsillectomy    . Cervical spine surgery      C4-5  . Colonoscopy  05/2010    Rourk: left-sided UC, bx with no dysplasia, shallow  diverticula  . Coronary stent placement  01/1995  . Esophagogastroduodenoscopy  07/24/2010    [id number] esophagus   Family History  Problem Relation Age of Onset  . Lung cancer Mother   . Cancer Mother     breast  . Diabetes Mother   . Stroke Father   . Hypertension Father   . Colon cancer Neg Hx    History   Social History  . Marital Status: Widowed    Spouse Name: [name]    Number of Children: [name]  . Years of Education: [name]   Occupational History  . maintenance     retired   Social History Main Topics  . Smoking status: Former Research scientist (life sciences)  . Smokeless tobacco: Never Used  . Alcohol Use: No  . Drug Use: No  . Sexual Activity: Yes    Partners: Female    Patent examiner Protection: None     Comment: spouse   Other Topics Concern  . None   Social History Narrative  . None    ROS:  General: Negative for anorexia, weight loss, fever, chills, fatigue, weakness. ENT: Negative for hoarseness, difficulty swallowing , nasal congestion. CV: Negative for chest pain, angina, palpitations, dyspnea on exertion, peripheral edema.  Respiratory: Negative for dyspnea at rest, dyspnea on exertion, cough, sputum, wheezing.  GI: See history of present illness. GU:  Negative for dysuria, hematuria, urinary incontinence. See history of present illness.  Endo: Negative for unusual weight change.    Physical Examination:   BP 150/73  Pulse 63  Temp(Src) 97.8 F (36.6 C) (Oral)  Ht 5' 10"  (1.778 m)  Wt 278 lb 12.8 oz (126.463 kg)  BMI 40 kg/m2  General: Well-nourished, well-developed in no acute distress.  Eyes: No icterus. Mouth: Oropharyngeal mucosa moist and pink, no lesions erythema or exudate. Lungs: Clear to auscultation bilaterally.  Heart: Regular rate and rhythm, no murmurs rubs or gallops.  Abdomen: Bowel sounds are normal, mild to moderate right upper quadrant tenderness, nondistended, no hepatosplenomegaly or masses, no abdominal bruits or hernia , no rebound or  guarding.   Extremities: No lower extremity edema. No clubbing or deformities. Neuro: Alert and oriented x 4   Skin: Warm and dry, no jaundice.   Psych: Alert and cooperative, normal mood and affect.

## 2012-11-07 NOTE — Op Note (Signed)
NAME:  [name], [name]             ACCOUNT NO.:  [id number]  MEDICAL RECORD NO.:  [id number]  LOCATION:  [facility]                          FACILITY:  [facility]  PHYSICIAN:  R. Garfield Cornea, MD [place]:  1941/01/05  DATE OF PROCEDURE:  11/07/2012 DATE OF DISCHARGE:                              OPERATIVE REPORT   PROCEDURE:  Ileo-colonoscopy, sigmoid biopsy, stool sampling.  INDICATIONS FOR PROCEDURE:  A 72 year old gentleman with history of left- sided proctocolitis, now with worsening of diarrhea and intermittent blood per rectum.  Ileal colonoscopy now being done to re-stage disease. Risks, benefits, limitations, alternatives, imponderables have been discussed, questions answered.  Please see the documentation, medical Record.  O2 saturation, blood pressure, pulse, respirations monitored throughout the entire procedure.  Conscious sedation with Versed 4 mg IV and Demerol 50 mg IV in divided doses.  INSTRUMENT:  Pentax video chip system.  FINDINGS:  Digital rectal exam revealed no abnormalities.  Endoscopic findings, prep was adequate.  It is notable during the procedure as the cecum was reached, we had technical difficulties in the lamp on the endoscopy unit; it dimmed to about 50% making with a power surge.  Imaging became more challenging. This could not be rectified until near the conclusion of the procedure. Since I was almost to the cecum, I elected not to start over.  The cecum, ileocecal valve, appendiceal orifice were well seen and photographed.  The terminal ileum was intubated to 10 cm.  This segment of the GI tract also appeared normal.  From this level, scope was slowly and cautiously withdrawn.  All previously mentioned mucosal surfaces were again seen.  The terminal ileum appeared normal.  The rectum, sigmoid, and descending segments were markedly abnormal with granularity, friability, and ulceration/loss of the normal vascular pattern to the mid descending  segment, proximal to the mid descending segment onto the cecum, endoscopic of the colonic mucosa appeared normal.  There were three 5 mm polyps in the sigmoid segment.  The rectal vault was small. It was markedly inflamed.  I elected not to attempt retroflex.  However, I was able to see the rectal mucosa well on-face.  Segmental biopsies of the ascending, transverse, descending, sigmoid, and rectal mucosa were taken for histologic study.  In addition, the 3 polyps in the sigmoid segment were cold snare removed.  The patient tolerated the procedure Well.  Cecal withdrawal time 16 minutes.  IMPRESSION:  Left-sided proctocolitis status post segmental biopsy. Sigmoid colon polyps removed as described above. Procedure compromisd by technical difficulties.  RECOMMENDATIONS: 1. Follow up on pending stool studies (stool sample was collected for     microbiology lab, not mentioned above). 2. Further recommendations to follow in the very near future.     Bridgette Habermann, MD [facility]     RMR/MEDQ  D:  11/07/2012  T:  11/07/2012  Job:  [id number]

## 2012-11-07 NOTE — Interval H&P Note (Signed)
History and Physical Interval Note:  11/07/2012 12:38 PM  [name]  has presented today for surgery, with the diagnosis of DIARRHEA EVALUATE FOR UC  The various methods of treatment have been discussed with the patient and family. After consideration of risks, benefits and other options for treatment, the patient has consented to  Procedure(s) with comments: COLONOSCOPY ([name]) - TCS WITH TI    12:15 PM as a surgical intervention .  The patient's history has been reviewed, patient examined, no change in status, stable for surgery.  I have reviewed the patient's chart and labs.  Questions were answered to the patient's satisfaction.     Colonoscopy per plan.The risks, benefits, limitations, alternatives and imponderables have been reviewed with the patient. Questions have been answered. All parties are agreeable.    Manus Rudd

## 2012-11-08 LAB — FECAL LACTOFERRIN, QUANT: Fecal Lactoferrin: POSITIVE

## 2012-11-09 ENCOUNTER — Other Ambulatory Visit: Payer: Self-pay | Admitting: Family Medicine

## 2012-11-11 ENCOUNTER — Encounter (HOSPITAL_COMMUNITY): Payer: Self-pay | Admitting: Internal Medicine

## 2012-11-11 LAB — STOOL CULTURE

## 2012-11-11 LAB — OVA AND PARASITE EXAMINATION: Ova and parasites: NONE SEEN

## 2012-11-13 ENCOUNTER — Other Ambulatory Visit: Payer: Self-pay | Admitting: Internal Medicine

## 2012-11-13 MED ORDER — HYDROCORTISONE 100 MG/60ML RE ENEM: 100.0000 mg | ENEMA | Freq: Every day | RECTAL | Status: DC

## 2012-11-13 NOTE — Progress Notes (Signed)
Pt is aware of OV on 11/10 at 0930 with LSL and appt card was given at the time he picked up his samples.

## 2012-11-18 ENCOUNTER — Ambulatory Visit (INDEPENDENT_AMBULATORY_CARE_PROVIDER_SITE_OTHER): Payer: Medicare Other | Admitting: Family Medicine

## 2012-11-18 ENCOUNTER — Encounter: Payer: Self-pay | Admitting: Family Medicine

## 2012-11-18 VITALS — BP 132/76 | Ht 70.0 in | Wt 282.0 lb

## 2012-11-18 DIAGNOSIS — N400 Enlarged prostate without lower urinary tract symptoms: Secondary | ICD-10-CM

## 2012-11-18 DIAGNOSIS — Z23 Encounter for immunization: Secondary | ICD-10-CM

## 2012-11-18 DIAGNOSIS — R350 Frequency of micturition: Secondary | ICD-10-CM

## 2012-11-18 DIAGNOSIS — E782 Mixed hyperlipidemia: Secondary | ICD-10-CM

## 2012-11-18 DIAGNOSIS — Z79899 Other long term (current) drug therapy: Secondary | ICD-10-CM

## 2012-11-18 LAB — POCT URINALYSIS DIPSTICK
Spec Grav, UA: 1.015
pH, UA: 7.5

## 2012-11-18 MED ORDER — TAMSULOSIN HCL 0.4 MG PO CAPS: 0.8000 mg | ORAL_CAPSULE | Freq: Every day | ORAL | Status: DC

## 2012-11-18 NOTE — Progress Notes (Signed)
  Subjective:    Patient ID: [name], male    DOB: [birth year], 72 y.o.   MRN: [id number]  HPI Patient is going to the bathroom 8-10 times a night but only a little bit comes out so patient does not think that he is emptying his bladder  Patient relates difficult time emptying bladder multiple urinations during the to some degree during the day to denies hematuria denies sweats chills fevers Patient received flu shot Review of Systems Benign lungs are clear hearts regular flanks nontender abdomen soft no guarding or rebound prostate exam mildly enlarged    Objective:   Physical Exam  Please see physical exam as detailed above Extremities no edema Urinalysis negative      Assessment & Plan:  BPH-stop doxazosin, use Flomax 0.4 mg 1 each bedtime if not doing enough over the next 5-6 days increase it to 2 each bedtime, followup in several weeks. If ongoing issues consider referral to urology  Lab work ordered patient to followup in several weeks.

## 2012-11-24 ENCOUNTER — Encounter: Payer: Self-pay | Admitting: Internal Medicine

## 2012-11-27 LAB — LIPID PANEL
Cholesterol: 144 mg/dL (ref 0–200)
HDL: 48 mg/dL (ref >39)
LDL Cholesterol: 77 mg/dL (ref 0–99)
Total CHOL/HDL Ratio: 3 Ratio
Triglycerides: 94 mg/dL (ref <150)
VLDL: 19 mg/dL (ref 0–40)

## 2012-11-27 LAB — BASIC METABOLIC PANEL
BUN: 11 mg/dL (ref 6–23)
CO2: 31 mEq/L (ref 19–32)
Calcium: 8.8 mg/dL (ref 8.4–10.5)
Chloride: 105 mEq/L (ref 96–112)
Creat: 0.82 mg/dL (ref 0.50–1.35)
Glucose, Bld: 113 mg/dL — ABNORMAL HIGH (ref 70–99)
Potassium: 3.5 mEq/L (ref 3.5–5.3)
Sodium: 143 mEq/L (ref 135–145)

## 2012-11-27 LAB — HEPATIC FUNCTION PANEL
ALT: 13 U/L (ref 0–53)
AST: 15 U/L (ref 0–37)
Albumin: 3.6 g/dL (ref 3.5–5.2)
Alkaline Phosphatase: 86 U/L (ref 39–117)
Bilirubin, Direct: 0.2 mg/dL (ref 0.0–0.3)
Indirect Bilirubin: 0.6 mg/dL (ref 0.0–0.9)
Total Bilirubin: 0.8 mg/dL (ref 0.3–1.2)
Total Protein: 6.4 g/dL (ref 6.0–8.3)

## 2012-12-07 ENCOUNTER — Other Ambulatory Visit: Payer: Self-pay | Admitting: Family Medicine

## 2012-12-09 ENCOUNTER — Ambulatory Visit (INDEPENDENT_AMBULATORY_CARE_PROVIDER_SITE_OTHER): Payer: Medicare Other | Admitting: Family Medicine

## 2012-12-09 ENCOUNTER — Encounter: Payer: Self-pay | Admitting: Family Medicine

## 2012-12-09 VITALS — BP 100/60 | Ht 73.0 in | Wt 281.0 lb

## 2012-12-09 DIAGNOSIS — J441 Chronic obstructive pulmonary disease with (acute) exacerbation: Secondary | ICD-10-CM

## 2012-12-09 DIAGNOSIS — N400 Enlarged prostate without lower urinary tract symptoms: Secondary | ICD-10-CM

## 2012-12-09 DIAGNOSIS — R7303 Prediabetes: Secondary | ICD-10-CM

## 2012-12-09 DIAGNOSIS — R7309 Other abnormal glucose: Secondary | ICD-10-CM

## 2012-12-09 DIAGNOSIS — I25810 Atherosclerosis of coronary artery bypass graft(s) without angina pectoris: Secondary | ICD-10-CM

## 2012-12-09 LAB — POCT GLYCOSYLATED HEMOGLOBIN (HGB A1C): Hemoglobin A1C: 5.6

## 2012-12-09 MED ORDER — PRAVASTATIN SODIUM 40 MG PO TABS: 40.0000 mg | ORAL_TABLET | Freq: Every day | ORAL | Status: DC

## 2012-12-09 NOTE — Patient Instructions (Signed)
Benign Prostatic Hypertrophy  The prostate gland is part of the reproductive system of men. A normal prostate is about the size and shape of a walnut. The prostate gland makes a fluid that is mixed with sperm to make semen. This gland surrounds the urethra and is located in front of the rectum and just below the bladder. The bladder is where urine is stored. The urethra is the tube through which urine passes from the bladder to get out of the body. The prostate grows as a man ages. An enlarged prostate not caused by cancer is called benign prostatic hypertrophy (BPH). This is a common health problem in men over age 74. This condition is a normal part of aging. An enlarged prostate presses on the urethra. This makes it harder to pass urine. In the early stages of enlargement, the bladder can get by with a narrowed urethra by forcing the urine through. If the problem gets worse, medical or surgical treatment may be required.  This condition should be followed by your caregiver. Longstanding back pressure on the kidneys can cause infection. Back pressure and infection can progress to bladder damage and kidney (renal) failure. If needed, your caregiver may refer you to a specialist in kidney and prostate disease (urologist). CAUSES  The exact cause is not known.  SYMPTOMS   You are not able to completely empty your bladder.  Getting up often during the night to urinate.  Need to urinate frequently during the day.  Difficultly in starting urine flow.  Decrease in size and strength of the urine stream.  Dribbling after urination.  Pain on urination (more common with infection).  Inability to pass your water. This needs immediate treatment. DIAGNOSIS  These tests will help your caregiver understand your problem:  Digital rectal exam (DRE). In a rectal exam, your caregiver checks your prostate by putting a gloved, lubricated finger into the rectum to feel the back of your prostate gland. This exam  detects the size of the gland and abnormal lumps or growths.  Urinalysis (exam of the urine). This may include a culture if there is concern about infection.  Prostate Specific Antigen (PSA). This is a blood test used to screen for prostate cancer. It is not used alone for diagnosing prostate cancer.  Rectal ultrasound (sonogram). This test uses sound waves to electronically produce a "picture" of the prostate. It helps examine the prostate gland for cancer. TREATMENT  Mild symptoms may not need treatment. Simple observation and yearly exams may be all that is required. Medications and surgery are options for more severe problems. Your caregiver can help you make an informed decision for what is best. Two classes of medications are available for relief of prostate symptoms:  Medications that shrink the prostate. This helps relieve symptoms.  Uncommon side effects include problems with sexual function.  Medications to relax the muscle of the prostate. This also relieves the obstruction.  Side effects can include dizziness, fatigue, lightheadedness, and retrograde ejaculation (diminished volume of ejaculate). Several types of surgical treatments are available for relief of prostate symptoms:  Transurethral resection of the prostate (TURP). In this treatment, an instrument is inserted through opening at the tip of the penis. It is used to cut away pieces of the inner core of the prostate. The pieces are removed through the same opening of the penis. This removes the obstruction and helps get rid of the symptoms.  Transurethral incision (TUIP). In this procedure, small cuts are made in the prostate. This  lessens the prostates pressure on the urethra.  Transurethral microwave thermotherapy (TUMT). This procedure uses microwaves to create heat. The heat destroys and removes a small amount of prostate tissue.  Transurethral needle ablation (TUNA). This is a procedure that uses radio frequencies to  do the same as TUMT.  Interstitial laser coagulation ([place]). This is a procedure that uses a laser to do the same as TUMT and TUNA.  Transurethral electrovaporization (TUVP). This is a procedure that uses electrodes to do the same as the procedures listed above. Regardless of the method of treatment chosen, you and your caregiver will discuss the options. With this knowledge, you along with your caregiver can decide upon the best treatment for you. SEEK MEDICAL CARE IF:   You develop chills, fever of 100.5 F (38.1 C), or night sweats.  There is unexplained back pain.  Symptoms are not helped by medications prescribed.  You develop medication side effects.  Your urine becomes very dark or has a bad smell. SEEK IMMEDIATE MEDICAL CARE IF:   You are suddenly unable to urinate. This is an emergency. You should be seen immediately.  There are large amounts of blood or clots in the urine.  Your urinary problems become unmanageable.  You develop lightheadedness, severe dizziness, or you feel faint.  You develop moderate to severe low back or flank pain.  You develop chills or fever. Document Released: 01/22/2005 Document Revised: 04/16/2011 Document Reviewed: 10/14/2006 [facility] Patient Information 2014 [place].

## 2012-12-09 NOTE — Progress Notes (Signed)
  Subjective:    Patient ID: [name], male    DOB: 10/06/1940, 72 y.o.   MRN: [id number]  HPI Patient is here to follow up on his BPH. He states that the flomax is not helping with his symptoms at all. He also needs a refill on his cholesterol medication.  He relates he is having severe difficulty with urination decreased flow denies high fever chills no hematuria he does have frequent bowel movements from colitis he denies chest tightness pressure pain shortness breath past medical history family history all reviewed Patient states he does try to watch his diet but he does eat more potatoes than what he should. He denies any sweats chills. Denies any angina symptoms. See past medical history family history as reviewed. Medicine list reviewed. Review of Systems See above, negative for headaches fever chills negative for chest pain shortness of breath negative for swelling in the legs    Objective:   Physical Exam  Nursing note and vitals reviewed. Constitutional: He appears well-developed.  HENT:  Head: Normocephalic.  Right Ear: External ear normal.  Left Ear: External ear normal.  Neck: Normal range of motion.  Cardiovascular: Normal rate and regular rhythm.   Pulmonary/Chest: Effort normal and breath sounds normal. No respiratory distress.  Abdominal: Soft. Bowel sounds are normal.  Musculoskeletal: He exhibits no edema.  Lymphadenopathy:    He has no cervical adenopathy.          Assessment & Plan:  Urology referral-severe BPH. Continue current medications for now Hyperlipidemia recent lipid profile looks good COPD symptoms stable Heart disease no chest pain blood pressure good Prediabetes-A1c looks good. Low starch low low sugar diet.

## 2012-12-12 ENCOUNTER — Other Ambulatory Visit: Payer: Self-pay | Admitting: Family Medicine

## 2012-12-14 ENCOUNTER — Emergency Department (HOSPITAL_COMMUNITY): Payer: Medicare Other

## 2012-12-14 ENCOUNTER — Observation Stay (HOSPITAL_COMMUNITY)
Admission: EM | Admit: 2012-12-14 | Discharge: 2012-12-14 | Disposition: A | Discharge status: Home / Self Care | Payer: Medicare Other | Attending: Internal Medicine | Admitting: Internal Medicine

## 2012-12-14 ENCOUNTER — Encounter (HOSPITAL_COMMUNITY): Payer: Self-pay | Admitting: Emergency Medicine

## 2012-12-14 DIAGNOSIS — R079 Chest pain, unspecified: Principal | ICD-10-CM | POA: Diagnosis present

## 2012-12-14 DIAGNOSIS — R1011 Right upper quadrant pain: Secondary | ICD-10-CM

## 2012-12-14 DIAGNOSIS — R0602 Shortness of breath: Secondary | ICD-10-CM | POA: Insufficient documentation

## 2012-12-14 DIAGNOSIS — K8020 Calculus of gallbladder without cholecystitis without obstruction: Secondary | ICD-10-CM | POA: Diagnosis present

## 2012-12-14 DIAGNOSIS — R7303 Prediabetes: Secondary | ICD-10-CM

## 2012-12-14 DIAGNOSIS — J189 Pneumonia, unspecified organism: Secondary | ICD-10-CM

## 2012-12-14 DIAGNOSIS — G4733 Obstructive sleep apnea (adult) (pediatric): Secondary | ICD-10-CM | POA: Diagnosis present

## 2012-12-14 DIAGNOSIS — J441 Chronic obstructive pulmonary disease with (acute) exacerbation: Secondary | ICD-10-CM

## 2012-12-14 DIAGNOSIS — K219 Gastro-esophageal reflux disease without esophagitis: Secondary | ICD-10-CM | POA: Diagnosis present

## 2012-12-14 DIAGNOSIS — K5150 Left sided colitis without complications: Secondary | ICD-10-CM

## 2012-12-14 DIAGNOSIS — K449 Diaphragmatic hernia without obstruction or gangrene: Secondary | ICD-10-CM | POA: Diagnosis present

## 2012-12-14 DIAGNOSIS — K5190 Ulcerative colitis, unspecified, without complications: Secondary | ICD-10-CM

## 2012-12-14 DIAGNOSIS — N400 Enlarged prostate without lower urinary tract symptoms: Secondary | ICD-10-CM

## 2012-12-14 DIAGNOSIS — I2510 Atherosclerotic heart disease of native coronary artery without angina pectoris: Secondary | ICD-10-CM

## 2012-12-14 LAB — CBC WITH DIFFERENTIAL/PLATELET
Basophils Absolute: 0.1 10*3/uL (ref 0.0–0.1)
Basophils Relative: 1 % (ref 0–1)
Eosinophils Absolute: 0.3 10*3/uL (ref 0.0–0.7)
Eosinophils Relative: 3 % (ref 0–5)
HCT: 41.7 % (ref 39.0–52.0)
Hemoglobin: 14 g/dL (ref 13.0–17.0)
Lymphocytes Relative: 39 % (ref 12–46)
Lymphs Abs: 3.1 10*3/uL (ref 0.7–4.0)
MCH: 29.6 pg (ref 26.0–34.0)
MCHC: 33.6 g/dL (ref 30.0–36.0)
MCV: 88.2 fL (ref 78.0–100.0)
Monocytes Absolute: 0.8 10*3/uL (ref 0.1–1.0)
Monocytes Relative: 10 % (ref 3–12)
Neutro Abs: 3.9 10*3/uL (ref 1.7–7.7)
Neutrophils Relative %: 48 % (ref 43–77)
Platelets: 215 10*3/uL (ref 150–400)
RBC: 4.73 MIL/uL (ref 4.22–5.81)
RDW: 13.4 % (ref 11.5–15.5)
WBC: 8.1 10*3/uL (ref 4.0–10.5)

## 2012-12-14 LAB — HEPATIC FUNCTION PANEL
ALT: 15 U/L (ref 0–53)
AST: 20 U/L (ref 0–37)
Albumin: 3.5 g/dL (ref 3.5–5.2)
Alkaline Phosphatase: 94 U/L (ref 39–117)
Bilirubin, Direct: 0.1 mg/dL (ref 0.0–0.3)
Indirect Bilirubin: 0.4 mg/dL (ref 0.3–0.9)
Total Bilirubin: 0.5 mg/dL (ref 0.3–1.2)
Total Protein: 7.3 g/dL (ref 6.0–8.3)

## 2012-12-14 LAB — URINALYSIS, ROUTINE W REFLEX MICROSCOPIC
Bilirubin Urine: NEGATIVE
Glucose, UA: NEGATIVE mg/dL
Ketones, ur: NEGATIVE mg/dL
Leukocytes, UA: NEGATIVE
Nitrite: NEGATIVE
Protein, ur: NEGATIVE mg/dL
Specific Gravity, Urine: 1.015 (ref 1.005–1.030)
Urobilinogen, UA: 0.2 mg/dL (ref 0.0–1.0)
pH: 6 (ref 5.0–8.0)

## 2012-12-14 LAB — BASIC METABOLIC PANEL
BUN: 16 mg/dL (ref 6–23)
CO2: 28 mEq/L (ref 19–32)
Calcium: 9.2 mg/dL (ref 8.4–10.5)
Chloride: 101 mEq/L (ref 96–112)
Creatinine, Ser: 1.35 mg/dL (ref 0.50–1.35)
GFR calc Af Amer: 58 mL/min — ABNORMAL LOW (ref >90)
GFR calc non Af Amer: 50 mL/min — ABNORMAL LOW (ref >90)
Glucose, Bld: 114 mg/dL — ABNORMAL HIGH (ref 70–99)
Potassium: 3.6 mEq/L (ref 3.5–5.1)
Sodium: 137 mEq/L (ref 135–145)

## 2012-12-14 LAB — URINE MICROSCOPIC-ADD ON

## 2012-12-14 LAB — TROPONIN I
Troponin I: <0.3 ng/mL (ref <0.30)
Troponin I: <0.3 ng/mL (ref <0.30)
Troponin I: <0.3 ng/mL (ref <0.30)

## 2012-12-14 LAB — LIPASE, BLOOD: Lipase: 40 U/L (ref 11–59)

## 2012-12-14 LAB — D-DIMER, QUANTITATIVE (NOT AT ARMC): D-Dimer, Quant: 1.41 ug/mL-FEU — ABNORMAL HIGH (ref 0.00–0.48)

## 2012-12-14 MED ORDER — OXYBUTYNIN CHLORIDE ER 5 MG PO TB24
10.0000 mg | ORAL_TABLET | Freq: Every day | ORAL | Status: DC
  Administered 2012-12-14: 10 mg via ORAL
  Filled 2012-12-14: qty 2

## 2012-12-14 MED ORDER — ONDANSETRON HCL 4 MG/2ML IJ SOLN: 4.0000 mg | Freq: Four times a day (QID) | INTRAMUSCULAR | Status: DC | PRN

## 2012-12-14 MED ORDER — ASPIRIN 81 MG PO CHEW
324.0000 mg | CHEWABLE_TABLET | Freq: Once | ORAL | Status: AC
  Administered 2012-12-14: 324 mg via ORAL
  Filled 2012-12-14: qty 4

## 2012-12-14 MED ORDER — PANTOPRAZOLE SODIUM 40 MG PO TBEC
40.0000 mg | DELAYED_RELEASE_TABLET | Freq: Two times a day (BID) | ORAL | Status: DC
  Administered 2012-12-14: 40 mg via ORAL
  Filled 2012-12-14: qty 1

## 2012-12-14 MED ORDER — SODIUM CHLORIDE 0.9 % IJ SOLN
3.0000 mL | Freq: Two times a day (BID) | INTRAMUSCULAR | Status: DC
  Administered 2012-12-14: 3 mL via INTRAVENOUS

## 2012-12-14 MED ORDER — SODIUM CHLORIDE 0.9 % IJ SOLN: 3.0000 mL | Freq: Two times a day (BID) | INTRAMUSCULAR | Status: DC

## 2012-12-14 MED ORDER — ACETAMINOPHEN 650 MG RE SUPP: 650.0000 mg | Freq: Four times a day (QID) | RECTAL | Status: DC | PRN

## 2012-12-14 MED ORDER — SODIUM CHLORIDE 0.9 % IV SOLN: 250.0000 mL | INTRAVENOUS | Status: DC | PRN

## 2012-12-14 MED ORDER — SODIUM CHLORIDE 0.9 % IJ SOLN: 3.0000 mL | INTRAMUSCULAR | Status: DC | PRN

## 2012-12-14 MED ORDER — GI COCKTAIL ~~LOC~~
30.0000 mL | Freq: Once | ORAL | Status: AC
  Administered 2012-12-14: 30 mL via ORAL
  Filled 2012-12-14: qty 30

## 2012-12-14 MED ORDER — MORPHINE SULFATE 2 MG/ML IJ SOLN: 1.0000 mg | INTRAMUSCULAR | Status: DC | PRN

## 2012-12-14 MED ORDER — SIMVASTATIN 20 MG PO TABS: 20.0000 mg | ORAL_TABLET | Freq: Every day | ORAL | Status: DC

## 2012-12-14 MED ORDER — METOPROLOL TARTRATE 25 MG PO TABS: 25.0000 mg | ORAL_TABLET | Freq: Two times a day (BID) | ORAL | Status: DC

## 2012-12-14 MED ORDER — FUROSEMIDE 40 MG PO TABS
40.0000 mg | ORAL_TABLET | Freq: Every day | ORAL | Status: DC
  Administered 2012-12-14: 40 mg via ORAL
  Filled 2012-12-14: qty 1

## 2012-12-14 MED ORDER — ALUM & MAG HYDROXIDE-SIMETH 200-200-20 MG/5ML PO SUSP
30.0000 mL | Freq: Four times a day (QID) | ORAL | Status: DC | PRN
Start: 2012-12-14 — End: 2012-12-14

## 2012-12-14 MED ORDER — TAMSULOSIN HCL 0.4 MG PO CAPS: 0.8000 mg | ORAL_CAPSULE | Freq: Every day | ORAL | Status: DC

## 2012-12-14 MED ORDER — MESALAMINE 1.2 G PO TBEC
4800.0000 mg | DELAYED_RELEASE_TABLET | Freq: Every day | ORAL | Status: DC
  Administered 2012-12-14: 4.8 g via ORAL
  Filled 2012-12-14 (×2): qty 4

## 2012-12-14 MED ORDER — METOPROLOL TARTRATE 25 MG PO TABS
25.0000 mg | ORAL_TABLET | Freq: Two times a day (BID) | ORAL | Status: DC
  Administered 2012-12-14: 25 mg via ORAL
  Filled 2012-12-14: qty 1

## 2012-12-14 MED ORDER — ACETAMINOPHEN 325 MG PO TABS: 650.0000 mg | ORAL_TABLET | Freq: Four times a day (QID) | ORAL | Status: DC | PRN

## 2012-12-14 MED ORDER — ONDANSETRON HCL 4 MG PO TABS: 4.0000 mg | ORAL_TABLET | Freq: Four times a day (QID) | ORAL | Status: DC | PRN

## 2012-12-14 MED ORDER — ASPIRIN 81 MG PO CHEW: 162.0000 mg | CHEWABLE_TABLET | Freq: Every day | ORAL | Status: DC

## 2012-12-14 MED ORDER — AMLODIPINE BESYLATE 5 MG PO TABS
10.0000 mg | ORAL_TABLET | Freq: Every day | ORAL | Status: DC
  Administered 2012-12-14: 10 mg via ORAL
  Filled 2012-12-14: qty 2

## 2012-12-14 MED ORDER — BUDESONIDE 3 MG PO CP24
9.0000 mg | ORAL_CAPSULE | Freq: Every day | ORAL | Status: DC
  Administered 2012-12-14: 9 mg via ORAL
  Filled 2012-12-14 (×2): qty 3

## 2012-12-14 MED ORDER — IOHEXOL 350 MG/ML SOLN
100.0000 mL | Freq: Once | INTRAVENOUS | Status: AC | PRN
  Administered 2012-12-14: 100 mL via INTRAVENOUS

## 2012-12-14 MED ORDER — ENOXAPARIN SODIUM 40 MG/0.4ML ~~LOC~~ SOLN
40.0000 mg | SUBCUTANEOUS | Status: DC
  Administered 2012-12-14: 40 mg via SUBCUTANEOUS
  Filled 2012-12-14: qty 0.4

## 2012-12-14 MED ORDER — LOSARTAN POTASSIUM 50 MG PO TABS: 50.0000 mg | ORAL_TABLET | Freq: Every day | ORAL | Status: DC

## 2012-12-14 NOTE — ED Provider Notes (Signed)
CSN: [id number]     Arrival date & time 12/14/12  0037 History   First MD Initiated Contact with Patient 12/14/12 0052     Chief Complaint  Patient presents with  . Chest Pain   (Consider location/radiation/quality/duration/timing/severity/associated sxs/prior Treatment) HPI Comments: 72 year old male with a history of coronary artery disease status post coronary stenting in 1996, followed by Dr. Donnella Bi. He presents with a complaint of chest pain which is located in the bilateral lower chest and upper abdomen, described as a fullness or a heaviness and tightness in his chest, persistent, waxes and wanes in intensity. This is associated with shortness of breath and diaphoresis but no nausea. He denies any exertional symptoms recently but does state that he was daily a earlier in the day. He does have a history of a hiatal hernia, hypertension, cholesterol but is not a tobacco user or diabetic.  Patient is a 72 y.o. male presenting with chest pain. The history is provided by the patient and a relative.  Chest Pain   Past Medical History  Diagnosis Date  . Hypertension   . DJD (degenerative joint disease)   . GERD (gastroesophageal reflux disease)   . Hyperlipidemia   . BPH (benign prostatic hyperplasia)   . Obstructive sleep apnea   . Diverticulosis   . Gastric ulcer 04/17/10    Three 85m gastric ulcers, H.pylori serologies were negative  . Clostridium difficile colitis 04/2005  . Idiopathic chronic inflammatory bowel disease 05/18/2010    left-sided UC  . S/P endoscopy 07/24/10    retained gastric contents, benign bx  . CAD (coronary artery disease) 01/1995  . Reflux 02/1995  . Colitis 2011  . Kidney stone   . COPD (chronic obstructive pulmonary disease)   . Asthma    Past Surgical History  Procedure Laterality Date  . Colonoscopy  04/2005    granularity and friability erosions from rectum to 40cm. Bx infection vs IBD. C. Diff positive at the time.   . Knee surgery  two  .  Shoulder surgery  two  . Foot surgery  two  . Tonsillectomy    . Cervical spine surgery      C4-5  . Colonoscopy  05/2010    Rourk: left-sided UC, bx with no dysplasia, shallow diverticula  . Coronary stent placement  01/1995  . Esophagogastroduodenoscopy  07/24/2010    RJOA:CZYSAYesophagus  . Colonoscopy [name] 11/07/2012    RTKZ:SWFU-XNATFproctocolitis status post segmental biopsy/Sigmoid colon polyps removed as described above. Procedure compromisd by technical difficulties   Family History  Problem Relation Age of Onset  . Lung cancer Mother   . Cancer Mother     breast  . Diabetes Mother   . Stroke Father   . Hypertension Father   . Colon cancer Neg Hx    History  Substance Use Topics  . Smoking status: Former SResearch scientist (life sciences) . Smokeless tobacco: Never Used  . Alcohol Use: No    Review of Systems  Cardiovascular: Positive for chest pain.  All other systems reviewed and are negative.    Allergies  Review of patient's allergies indicates no known allergies.  Home Medications   Current Outpatient Rx  Name  Route  Sig  Dispense  Refill  . amLODipine (NORVASC) 10 MG tablet      TAKE 1 TABLET BY MOUTH EVERY DAY   90 tablet   1   . aspirin 81 MG tablet   Oral   Take 162 mg by mouth daily.           .Marland Kitchen  Budesonide (UCERIS) 9 MG TB24   Oral   Take 9 mg by mouth daily.         . furosemide (LASIX) 40 MG tablet   Oral   Take 40 mg by mouth as needed.         . hydrocortisone (CORTENEMA) 100 MG/60ML enema   Rectal   Place 1 enema (100 mg total) rectally at bedtime. Retain for at least 60 minutes.   30 enema   0   . losartan (COZAAR) 100 MG tablet   Oral   Take 50 mg by mouth daily.         . mesalamine (LIALDA) 1.2 G EC tablet   Oral   Take 4 tablets (4.8 g total) by mouth daily.   120 tablet   11   . metoprolol (LOPRESSOR) 50 MG tablet      TAKE 1 TABLET 3 TIMES A DAY   90 tablet   5   . NITROSTAT 0.4 MG SL tablet   Oral   Take 1 tablet by mouth  as needed.         Marland Kitchen omeprazole (PRILOSEC) 40 MG capsule   Oral   Take 1 tablet by mouth daily.         Marland Kitchen oxybutynin (DITROPAN-XL) 10 MG 24 hr tablet   Oral   Take 10 mg by mouth daily.         . polyethylene glycol-electrolytes (TRILYTE) 420 G solution   Oral   Take 4,000 mLs by mouth as directed.   4000 mL   0   . pravastatin (PRAVACHOL) 40 MG tablet   Oral   Take 1 tablet (40 mg total) by mouth daily.   30 tablet   6   . tamsulosin (FLOMAX) 0.4 MG CAPS capsule   Oral   Take 2 capsules (0.8 mg total) by mouth at bedtime.   60 capsule   6    BP 176/75  Pulse 55  Temp(Src) 97.6 F (36.4 C) (Oral)  Resp 16  Ht 6' 1"  (1.854 m)  Wt 279 lb (126.554 kg)  BMI 36.82 kg/m2  SpO2 96% Physical Exam  Nursing note and vitals reviewed. Constitutional: He appears well-developed and well-nourished. He appears distressed.  HENT:  Head: Normocephalic and atraumatic.  Mouth/Throat: Oropharynx is clear and moist. No oropharyngeal exudate.  Eyes: Conjunctivae and EOM are normal. Pupils are equal, round, and reactive to light. Right eye exhibits no discharge. Left eye exhibits no discharge. No scleral icterus.  Neck: Normal range of motion. Neck supple. No JVD present. No thyromegaly present.  Cardiovascular: Normal rate, regular rhythm, normal heart sounds and intact distal pulses.  Exam reveals no gallop and no friction rub.   No murmur heard. Pulmonary/Chest: Effort normal and breath sounds normal. No respiratory distress. He has no wheezes. He has no rales.  Abdominal: Soft. Bowel sounds are normal. He exhibits no distension and no mass. There is tenderness ( Bilateral upper abdominal tenderness, patient states not sure it is the same as his chest pain).  Musculoskeletal: Normal range of motion. He exhibits edema (mild bilateral pitting edema). He exhibits no tenderness.  Lymphadenopathy:    He has no cervical adenopathy.  Neurological: He is alert. Coordination normal.   Skin: Skin is warm and dry. No rash noted. No erythema.  Psychiatric: He has a normal mood and affect. His behavior is normal.    ED Course  Procedures (including critical care time) Labs Review Labs Reviewed  BASIC METABOLIC PANEL - Abnormal; Notable for the following:    Glucose, Bld 114 (*)    GFR calc non Af Amer 50 (*)    GFR calc Af Amer 58 (*)    All other components within normal limits  D-DIMER, QUANTITATIVE - Abnormal; Notable for the following:    D-Dimer, Quant 1.41 (*)    All other components within normal limits  TROPONIN I  CBC WITH DIFFERENTIAL  HEPATIC FUNCTION PANEL  LIPASE, BLOOD   Imaging Review Ct Angio Chest Pe W/cm &/or Wo Cm  12/14/2012   CLINICAL DATA:  Acute onset of shortness of breath and chest pain.  EXAM: CT ANGIOGRAPHY CHEST WITH CONTRAST  TECHNIQUE: Multidetector CT imaging of the chest was performed using the standard protocol during bolus administration of intravenous contrast. Multiplanar CT image reconstructions including MIPs were obtained to evaluate the vascular anatomy.  CONTRAST:  140m OMNIPAQUE IOHEXOL 350 MG/ML SOLN  COMPARISON:  Chest radiograph performed earlier today at 1:44 a.m., and CT of the chest performed 05/05/2012  FINDINGS: There is no evidence of central pulmonary embolus. Evaluation for pulmonary embolus is suboptimal due to limitations in the timing of the contrast bolus.  Bilateral emphysema is noted, most prominent at the upper lung lobes. Mild associated atelectasis is seen. No pleural effusion or pneumothorax is identified. No masses are identified; no abnormal focal contrast enhancement is seen.  Diffuse coronary artery calcifications are seen. The mediastinum is otherwise unremarkable in appearance. No mediastinal lymphadenopathy is seen. No pericardial effusion is identified. The great vessels are grossly unremarkable in appearance. No axillary lymphadenopathy is seen. The thyroid gland is unremarkable in appearance.  The  visualized portions of the liver and spleen are unremarkable.  No acute osseous abnormalities are seen. Anterior bridging osteophytes are noted along the thoracic spine.  Review of the MIP images confirms the above findings.  IMPRESSION: 1. No evidence of central pulmonary embolus. 2. Bilateral emphysema, most prominent at the upper lung lobes. 3. Mild bilateral atelectasis seen. 4. Diffuse coronary calcifications noted.   Electronically Signed   By: JGarald BaldingM.D.   On: 12/14/2012 03:43   Dg Chest Port 1 View  12/14/2012   CLINICAL DATA:  Lower bilateral chest pain.  EXAM: PORTABLE CHEST - 1 VIEW  COMPARISON:  Chest radiograph from 04/30/2012, and CT of the chest performed 05/05/2012  FINDINGS: The lungs are well-aerated. Pulmonary vascularity is at the upper limits of normal. There is no evidence of focal opacification, pleural effusion or pneumothorax.  The cardiomediastinal silhouette is borderline normal in size. No acute osseous abnormalities are seen.  IMPRESSION: No acute cardiopulmonary process seen.   Electronically Signed   By: JGarald BaldingM.D.   On: 12/14/2012 01:57    EKG Interpretation     Ventricular Rate:  68 PR Interval:  224 QRS Duration: 102 QT Interval:  420 QTC Calculation: 446 R Axis:   0 Text Interpretation:  Sinus rhythm with 1st degree A-V block Otherwise normal ECG No previous ECGs available            MDM   1. Chest pain at rest   2. CAD (coronary artery disease), native coronary artery   3. Obstructive sleep apnea   4. Ulcerative colitis, unspecified    The patient has an abnormal exam, he is obese and has a large abdomen, known hiatal hernia but also known coronary disease. His EKG does not show acute ischemia, he does have a slight left axis deviation but  no ST elevations or depressions to suggest a stabbing. His symptoms have only been going on for 2 hours, he will need workup including troponin and further testing. This could be related to his  throwing hay bales today though he is short of breath and diaphoretic with his symptoms which is unusual.  CT scan of the chest because of a high d-dimer shows no signs of acute pulmonary embolism or pulmonary findings.   Discussed with the hospitalist, troponin normal, patient still has mild symptoms and needs to be admitted.  Johnna Acosta, MD 12/14/12 [phone]

## 2012-12-14 NOTE — Progress Notes (Signed)
Patient states understanding of discharge.

## 2012-12-14 NOTE — ED Notes (Signed)
Dr. Maryland Pink at bedside.

## 2012-12-14 NOTE — H&P (Signed)
[facility] History and Physical  VICTORINO FATZINGER YQM:[id number] DOB: [birth year] DOA: 12/14/2012   PCP: Sallee Lange, MD  Specialists: Followed by Dr. Quay Burow, with cardiology. Followed by Dr. Gala Romney with gastroenterology for ulcerative colitis  Chief Complaint: Chest pain at rest  HPI: [name] is a 72 y.o. male with a past medical history of coronary artery disease, with a stent placed in 1996, history of hypertension, ulcerative colitis, gallstones, who was in his usual state of health till last night when he went to sleep at 9:30 PM. He woke up at 11 PM with pain in the lower part of his chest and upper abdomen. He had difficulty breathing as a result of this pain. He described it as a tightness. There was no radiation of the pain. Patient took tums and walked around his house. The pain was 10 out of 10 in intensity. There was no relief so he decided to drive to the hospital. He also felt a little dizzy, but denies any nausea, vomiting or syncopal episodes. He felt clammy, and started sweating. Had some palpitations. He does have chronic leg swelling in both his legs. He was given aspirin in the emergency department. He has nitroglycerin at home, but he did not take them. Yesterday he did a lot of exertional activities stacking straw. But he did not have any chest pain with those activities. He does have a history of hiatal hernia and with certain foods he does get acid reflux. He's had MI in the past, but the pain that he had tonight was not similar to the one that he had with his MI. He states that the pain is almost resolved at this time.  Home Medications: Prior to Admission medications   Medication Sig Start Date End Date Taking? Authorizing Provider  amLODipine (NORVASC) 10 MG tablet TAKE 1 TABLET BY MOUTH EVERY DAY 12/12/12   [name], MD  aspirin 81 MG tablet Take 162 mg by mouth daily.      Historical Provider, MD  Budesonide (UCERIS) 9 MG TB24 Take 9 mg by  mouth daily.    Historical Provider, MD  furosemide (LASIX) 40 MG tablet Take 40 mg by mouth as needed.    Historical Provider, MD  hydrocortisone (CORTENEMA) 100 MG/60ML enema Place 1 enema (100 mg total) rectally at bedtime. Retain for at least 60 minutes. 11/13/12   Daneil Dolin, MD  losartan (COZAAR) 100 MG tablet Take 50 mg by mouth daily.    Historical Provider, MD  mesalamine (LIALDA) 1.2 G EC tablet Take 4 tablets (4.8 g total) by mouth daily. 10/27/12   Mahala Menghini, PA-C  metoprolol (LOPRESSOR) 50 MG tablet TAKE 1 TABLET 3 TIMES A DAY 07/10/12   [name], MD  NITROSTAT 0.4 MG SL tablet Take 1 tablet by mouth as needed. 04/18/10   Historical Provider, MD  omeprazole (PRILOSEC) 40 MG capsule Take 1 tablet by mouth daily. 04/17/10   Historical Provider, MD  oxybutynin (DITROPAN-XL) 10 MG 24 hr tablet Take 10 mg by mouth daily.    Historical Provider, MD  polyethylene glycol-electrolytes (TRILYTE) 420 G solution Take 4,000 mLs by mouth as directed. 11/04/12   Daneil Dolin, MD  pravastatin (PRAVACHOL) 40 MG tablet Take 1 tablet (40 mg total) by mouth daily. 12/09/12   [name], MD  tamsulosin (FLOMAX) 0.4 MG CAPS capsule Take 2 capsules (0.8 mg total) by mouth at bedtime. 11/18/12 11/18/16  [name], MD  Allergies: No Known Allergies  Past Medical History: Past Medical History  Diagnosis Date  . Hypertension   . DJD (degenerative joint disease)   . GERD (gastroesophageal reflux disease)   . Hyperlipidemia   . BPH (benign prostatic hyperplasia)   . Obstructive sleep apnea   . Diverticulosis   . Gastric ulcer 04/17/10    Three 107m gastric ulcers, H.pylori serologies were negative  . Clostridium difficile colitis 04/2005  . Idiopathic chronic inflammatory bowel disease 05/18/2010    left-sided UC  . S/P endoscopy 07/24/10    retained gastric contents, benign bx  . CAD (coronary artery disease) 01/1995  . Reflux 02/1995  . Colitis 2011  . Kidney stone   . COPD  (chronic obstructive pulmonary disease)   . Asthma     Past Surgical History  Procedure Laterality Date  . Colonoscopy  04/2005    granularity and friability erosions from rectum to 40cm. Bx infection vs IBD. C. Diff positive at the time.   . Knee surgery  two  . Shoulder surgery  two  . Foot surgery  two  . Tonsillectomy    . Cervical spine surgery      C4-5  . Colonoscopy  05/2010    Rourk: left-sided UC, bx with no dysplasia, shallow diverticula  . Coronary stent placement  01/1995  . Esophagogastroduodenoscopy  07/24/2010    RWYO:VZCHYIesophagus  . Colonoscopy [name] 11/07/2012    RFOY:DXAJ-OINOMproctocolitis status post segmental biopsy/Sigmoid colon polyps removed as described above. Procedure compromisd by technical difficulties    Social History: Patient lives by himself. He denies smoking, alcohol or illicit drug use. He is independent with daily activities.  Family History:  Family History  Problem Relation Age of Onset  . Lung cancer Mother   . Cancer Mother     breast  . Diabetes Mother   . Stroke Father   . Hypertension Father   . Colon cancer Neg Hx      Review of Systems - History obtained from the patient General ROS: negative Psychological ROS: negative Ophthalmic ROS: negative ENT ROS: negative Allergy and Immunology ROS: negative Hematological and Lymphatic ROS: negative Endocrine ROS: negative Respiratory ROS: as in hpi Cardiovascular ROS: as in hpi Gastrointestinal ROS: no abdominal pain, change in bowel habits, or black or bloody stools Genito-Urinary ROS: increased frequency of urination Musculoskeletal ROS: negative Neurological ROS: no TIA or stroke symptoms Dermatological ROS: negative  Physical Examination  Filed Vitals:   12/14/12 0052 12/14/12 0100 12/14/12 0305 12/14/12 0400  BP: 175/85 172/64 176/70 176/75  Pulse: 61 63 63 55  Temp: 97.6 F (36.4 C)     TempSrc: Oral     Resp: 12 24 18 16   Height: 6' 1"  (1.854 m)     Weight:  126.554 kg (279 lb)     SpO2: 96% 97% 96% 96%    General appearance: alert, cooperative, appears stated age, no distress and morbidly obese Head: Normocephalic, without obvious abnormality, atraumatic Eyes: conjunctivae/corneas clear. PERRL, EOM's intact.  Throat: lips, mucosa, and tongue normal; teeth and gums normal Resp: clear to auscultation bilaterally Cardio: regular rate and rhythm, S1, S2 normal, no murmur, click, rub or gallop GI: soft, non-tender; bowel sounds normal; no masses,  no organomegaly Extremities: 1+ pitting edema BIL LE Pulses: 2+ and symmetric Skin: Skin color, texture, turgor normal. No rashes or lesions Lymph nodes: Cervical, supraclavicular, and axillary nodes normal. Neurologic: He is alert and oriented x3. No focal deficits are noted.  Laboratory Data: Results for orders placed during the hospital encounter of 12/14/12 (from the past 48 hour(s))  TROPONIN I     Status: None   Collection Time    12/14/12 12:55 AM      Result Value Range   Troponin I <0.30  <0.30 ng/mL   Comment:            Due to the release kinetics of cTnI,     a negative result within the first hours     of the onset of symptoms does not rule out     myocardial infarction with certainty.     If myocardial infarction is still suspected,     repeat the test at appropriate intervals.  CBC WITH DIFFERENTIAL     Status: None   Collection Time    12/14/12 12:55 AM      Result Value Range   WBC 8.1  4.0 - 10.5 K/uL   RBC 4.73  4.22 - 5.81 MIL/uL   Hemoglobin 14.0  13.0 - 17.0 g/dL   HCT 41.7  39.0 - 52.0 %   MCV 88.2  78.0 - 100.0 fL   MCH 29.6  26.0 - 34.0 pg   MCHC 33.6  30.0 - 36.0 g/dL   RDW 13.4  11.5 - 15.5 %   Platelets 215  150 - 400 K/uL   Neutrophils Relative % 48  43 - 77 %   Neutro Abs 3.9  1.7 - 7.7 K/uL   Lymphocytes Relative 39  12 - 46 %   Lymphs Abs 3.1  0.7 - 4.0 K/uL   Monocytes Relative 10  3 - 12 %   Monocytes Absolute 0.8  0.1 - 1.0 K/uL   Eosinophils  Relative 3  0 - 5 %   Eosinophils Absolute 0.3  0.0 - 0.7 K/uL   Basophils Relative 1  0 - 1 %   Basophils Absolute 0.1  0.0 - 0.1 K/uL  BASIC METABOLIC PANEL     Status: Abnormal   Collection Time    12/14/12 12:55 AM      Result Value Range   Sodium 137  135 - 145 mEq/L   Potassium 3.6  3.5 - 5.1 mEq/L   Chloride 101  96 - 112 mEq/L   CO2 28  19 - 32 mEq/L   Glucose, Bld 114 (*) 70 - 99 mg/dL   BUN 16  6 - 23 mg/dL   Creatinine, Ser 1.35  0.50 - 1.35 mg/dL   Calcium 9.2  8.4 - 10.5 mg/dL   GFR calc non Af Amer 50 (*) >90 mL/min   GFR calc Af Amer 58 (*) >90 mL/min   Comment: (NOTE)     The eGFR has been calculated using the CKD EPI equation.     This calculation has not been validated in all clinical situations.     eGFR's persistently <90 mL/min signify possible Chronic Kidney     Disease.  D-DIMER, QUANTITATIVE     Status: Abnormal   Collection Time    12/14/12 12:55 AM      Result Value Range   D-Dimer, Quant 1.41 (*) 0.00 - 0.48 ug/mL-FEU   Comment:            AT THE INHOUSE ESTABLISHED CUTOFF     VALUE OF 0.48 ug/mL FEU,     THIS ASSAY HAS BEEN DOCUMENTED     IN THE LITERATURE TO HAVE     A SENSITIVITY AND NEGATIVE  PREDICTIVE VALUE OF AT LEAST     98 TO 99%.  THE TEST RESULT     SHOULD BE CORRELATED WITH     AN ASSESSMENT OF THE CLINICAL     PROBABILITY OF DVT / VTE.    Radiology Reports: Ct Angio Chest Pe W/cm &/or Wo Cm  12/14/2012   CLINICAL DATA:  Acute onset of shortness of breath and chest pain.  EXAM: CT ANGIOGRAPHY CHEST WITH CONTRAST  TECHNIQUE: Multidetector CT imaging of the chest was performed using the standard protocol during bolus administration of intravenous contrast. Multiplanar CT image reconstructions including MIPs were obtained to evaluate the vascular anatomy.  CONTRAST:  170m OMNIPAQUE IOHEXOL 350 MG/ML SOLN  COMPARISON:  Chest radiograph performed earlier today at 1:44 a.m., and CT of the chest performed 05/05/2012  FINDINGS: There is no  evidence of central pulmonary embolus. Evaluation for pulmonary embolus is suboptimal due to limitations in the timing of the contrast bolus.  Bilateral emphysema is noted, most prominent at the upper lung lobes. Mild associated atelectasis is seen. No pleural effusion or pneumothorax is identified. No masses are identified; no abnormal focal contrast enhancement is seen.  Diffuse coronary artery calcifications are seen. The mediastinum is otherwise unremarkable in appearance. No mediastinal lymphadenopathy is seen. No pericardial effusion is identified. The great vessels are grossly unremarkable in appearance. No axillary lymphadenopathy is seen. The thyroid gland is unremarkable in appearance.  The visualized portions of the liver and spleen are unremarkable.  No acute osseous abnormalities are seen. Anterior bridging osteophytes are noted along the thoracic spine.  Review of the MIP images confirms the above findings.  IMPRESSION: 1. No evidence of central pulmonary embolus. 2. Bilateral emphysema, most prominent at the upper lung lobes. 3. Mild bilateral atelectasis seen. 4. Diffuse coronary calcifications noted.   Electronically Signed   By: JGarald BaldingM.D.   On: 12/14/2012 03:43   Dg Chest Port 1 View  12/14/2012   CLINICAL DATA:  Lower bilateral chest pain.  EXAM: PORTABLE CHEST - 1 VIEW  COMPARISON:  Chest radiograph from 04/30/2012, and CT of the chest performed 05/05/2012  FINDINGS: The lungs are well-aerated. Pulmonary vascularity is at the upper limits of normal. There is no evidence of focal opacification, pleural effusion or pneumothorax.  The cardiomediastinal silhouette is borderline normal in size. No acute osseous abnormalities are seen.  IMPRESSION: No acute cardiopulmonary process seen.   Electronically Signed   By: JGarald BaldingM.D.   On: 12/14/2012 01:57    Electrocardiogram: Sinus rhythm with first degree AV block. Norrmal axis. No Q waves. No concerning ST or T-wave  changes.  Problem List  Principal Problem:   Chest pain at rest Active Problems:   Obstructive sleep apnea   Gallstones   CAD (coronary artery disease), native coronary artery   Hiatal hernia   GERD (gastroesophageal reflux disease)   Assessment: This is a 72year old, Caucasian male, who presents with chest pain, which woke him up from sleep. EKG is nonischemic. Differential diagnosis is broad and includes cardiac/angina, pain due to acid reflux or gastritis, biliary colic, pancreatitis. He did have an elevated d-dimer but CT was negative for PE.  Plan: #1 chest pain at rest: Will check LFTs and lipase. We'll give PPI. Troponin will be obtained serially. Aspirin will be continued. He'll be observed in the hospital.  #2 history of coronary artery disease and hypertension: Continue with his beta blocker and other antihypertensive agents with holding parameters. Aspirin will be continued  as mentioned above.   #3 history of hiatal hernia and GERD: Continue with PPI.  #5 history of obstructive sleep apnea: Continue with CPAP.  #6 history of ulcerative colitis: Continue with his medications. This issue appears to be stable.   DVT Prophylaxis: Lovenox Code Status: Full code Family Communication: Discussed with the patient and his daughters  Disposition Plan: Observe in telemetry   Further management decisions will depend on results of further testing and patient's response to treatment.  [facility]  [facility] Pager [phone]  If 7PM-7AM, please contact night-coverage [email or web address] Password [facility]  12/14/2012, 4:28 AM

## 2012-12-14 NOTE — Discharge Summary (Signed)
Physician Discharge Summary  [name]:[id number] DOB: 1940-06-07 DOA: 12/14/2012  PCP: Sallee Lange, MD  Admit date: 12/14/2012 Discharge date: 12/14/2012  Recommendations for Outpatient Follow-up:  1. Follow up with cardiology and PCP within one week of discharge  Discharge Diagnoses:  Principal Problem:   Chest pain at rest Active Problems:   Obstructive sleep apnea   Gallstones   CAD (coronary artery disease), native coronary artery   Hiatal hernia   GERD (gastroesophageal reflux disease)   Discharge Condition: stable, improved  Diet recommendation: healthy heart  Wt Readings from Last 3 Encounters:  12/14/12 124.3 kg (274 lb 0.5 oz)  12/09/12 127.461 kg (281 lb)  11/18/12 127.914 kg (282 lb)    History of present illness:   [name] is a 72 y.o. male with a past medical history of coronary artery disease, with a stent placed in 1996, history of hypertension, ulcerative colitis, gallstones, who was in his usual state of health till last night when he went to sleep at 9:30 PM. He woke up at 11 PM with pain in the lower part of his chest and upper abdomen. He had difficulty breathing as a result of this pain. He described it as a tightness. There was no radiation of the pain. Patient took tums and walked around his house. The pain was 10 out of 10 in intensity. There was no relief so he decided to drive to the hospital. He also felt a little dizzy, but denies any nausea, vomiting or syncopal episodes. He felt clammy, and started sweating. Had some palpitations. He does have chronic leg swelling in both his legs. He was given aspirin in the emergency department. He has nitroglycerin at home, but he did not take them. Yesterday he did a lot of exertional activities stacking straw. But he did not have any chest pain with those activities. He does have a history of hiatal hernia and with certain foods he does get acid reflux. He's had MI in the past, but the pain that  he had tonight was not similar to the one that he had with his MI. He states that the pain is almost resolved at this time.  Hospital Course:   Epigastric pain:  Per my history, [name] developed an epigastric soreness which radiated to the left adn right upper quadrants.  Pain is worse with inspiration and with movement, particularly sitting up from a reclining position.  Patient attributes pain to lifting and tossing several hay bales for a hay ride yesterday.  He did not have chest pains during his exertion yesterday.  His neck, shoulders, arms, and back are also sore.  He states that eating and drinking do not worsen his pain and he denies indigestion/heart burn symptoms.  Telemetry demonstrated NSR and his troponins were negative x 3.  His liver function tests and lipase were normal.  His UA was unremarkable.  Most likely this is epigastric pain related to muscle strain from recent heavy lifting, however, he should follow up with his primary cardiologist this week for further evaluation.  He reports his last stress test was negative last year (last one I found in EPIC was 2007 however) and he had a NM hep/GB scan 09/2012 which was also normal.    CAD/HTN:  BP stable.  Continue ASA, statin, BB, norvasc. GERD, hiatal hernia:  Continue PPI.  Trial of GI cocktail OSA, stable.  Continue CPAP. UC, stable.  Continue mesalamine.    Procedures:  CT angio chest  Consultations:  None  Discharge Exam: Filed Vitals:   12/14/12 0600  BP: 155/65  Pulse: 57  Temp: 97.6 F (36.4 C)  Resp: 18   Filed Vitals:   12/14/12 0100 12/14/12 0305 12/14/12 0400 12/14/12 0600  BP: 172/64 176/70 176/75 155/65  Pulse: 63 63 55 57  Temp:    97.6 F (36.4 C)  TempSrc:    Oral  Resp: 24 18 16 18   Height:    6' 1"  (1.854 m)  Weight:    124.3 kg (274 lb 0.5 oz)  SpO2: 97% 96% 96% 97%    General: Obese CM, NAD HEENT:  NCAT, MMM Cardiovascular: RRR, no mrg, 2+ pulses Respiratory: CTAB ABD:  NABS,  soft, TTP along the inferior margin of the ribs along the entire RUQ/epigastric/LUQ area without rebound or guarding.  Palpation of these areas reproduces the severe pain that he experienced previously.   MSK:  No LEE  Discharge Instructions      Discharge Orders   Future Appointments Provider Department Dept Phone   12/16/2012 9:30 AM [facility] [phone]   Future Orders Complete By Expires   Call MD for:  difficulty breathing, headache or visual disturbances  As directed    Call MD for:  extreme fatigue  As directed    Call MD for:  hives  As directed    Call MD for:  persistant dizziness or light-headedness  As directed    Call MD for:  persistant nausea and vomiting  As directed    Call MD for:  severe uncontrolled pain  As directed    Call MD for:  temperature >100.4  As directed    Diet - low sodium heart healthy  As directed    Discharge instructions  As directed    Comments:     You were hospitalized with severe upper abdominal pain.  You may have strained some muscles yesterday.  Your tests for pulmonary embolism (blood clot to the lungs), and heart attack have so far been negative.  Please follow up with Dr. Gwenlyn Found later this week in case you may have had some heart-related pain and you can try maalox for heart-burn like symptoms.  Use heating pads/warm compresses and gently stretch your muscles several times a day.  Stay active (but do not do strenuous activities) because this can also help ease muscle soreness.  If you have severe chest pain with shortness of breath, nausea, vomiting, or other symptoms concerning for heart attack, please return immediately to the hospital.   Increase activity slowly  As directed        Medication List         albuterol 108 (90 BASE) MCG/ACT inhaler  Commonly known as:  PROVENTIL HFA;VENTOLIN HFA  Inhale 2 puffs into the lungs every 6 (six) hours as needed for wheezing or shortness of  breath.     amLODipine 10 MG tablet  Commonly known as:  NORVASC  Take 10 mg by mouth daily.     aspirin 81 MG tablet  Take 162 mg by mouth daily.     doxazosin 8 MG tablet  Commonly known as:  CARDURA  Take 4 mg by mouth daily.     losartan 50 MG tablet  Commonly known as:  COZAAR  Take 50 mg by mouth daily.     mesalamine 1.2 G EC tablet  Commonly known as:  LIALDA  Take 4 tablets (4.8 g total) by mouth daily.  metoprolol tartrate 25 MG tablet  Commonly known as:  LOPRESSOR  Take 1 tablet (25 mg total) by mouth 2 (two) times daily.     nitroGLYCERIN 0.4 MG SL tablet  Commonly known as:  NITROSTAT  Place 0.4 mg under the tongue every 5 (five) minutes as needed for chest pain.     omeprazole 40 MG capsule  Commonly known as:  PRILOSEC  Take 1 tablet by mouth daily.     pravastatin 40 MG tablet  Commonly known as:  PRAVACHOL  Take 1 tablet (40 mg total) by mouth daily.     tamsulosin 0.4 MG Caps capsule  Commonly known as:  FLOMAX  Take 2 capsules (0.8 mg total) by mouth at bedtime.       Follow-up Information   Follow up with LUKING,SCOTT, MD. Schedule an appointment as soon as possible for a visit in 2 weeks.   Specialty:  Family Medicine   Contact information:   [address] [place] [address] [phone]       Follow up with Lorretta Harp, MD. Schedule an appointment as soon as possible for a visit in 1 week.   Specialty:  Cardiology   Contact information:   [address] [place] [address] [phone]        The results of significant diagnostics from this hospitalization (including imaging, microbiology, ancillary and laboratory) are listed below for reference.    Significant Diagnostic Studies: Ct Angio Chest Pe W/cm &/or Wo Cm  12/14/2012   CLINICAL DATA:  Acute onset of shortness of breath and chest pain.  EXAM: CT ANGIOGRAPHY CHEST WITH CONTRAST  TECHNIQUE: Multidetector CT imaging of the chest was  performed using the standard protocol during bolus administration of intravenous contrast. Multiplanar CT image reconstructions including MIPs were obtained to evaluate the vascular anatomy.  CONTRAST:  122m OMNIPAQUE IOHEXOL 350 MG/ML SOLN  COMPARISON:  Chest radiograph performed earlier today at 1:44 a.m., and CT of the chest performed 05/05/2012  FINDINGS: There is no evidence of central pulmonary embolus. Evaluation for pulmonary embolus is suboptimal due to limitations in the timing of the contrast bolus.  Bilateral emphysema is noted, most prominent at the upper lung lobes. Mild associated atelectasis is seen. No pleural effusion or pneumothorax is identified. No masses are identified; no abnormal focal contrast enhancement is seen.  Diffuse coronary artery calcifications are seen. The mediastinum is otherwise unremarkable in appearance. No mediastinal lymphadenopathy is seen. No pericardial effusion is identified. The great vessels are grossly unremarkable in appearance. No axillary lymphadenopathy is seen. The thyroid gland is unremarkable in appearance.  The visualized portions of the liver and spleen are unremarkable.  No acute osseous abnormalities are seen. Anterior bridging osteophytes are noted along the thoracic spine.  Review of the MIP images confirms the above findings.  IMPRESSION: 1. No evidence of central pulmonary embolus. 2. Bilateral emphysema, most prominent at the upper lung lobes. 3. Mild bilateral atelectasis seen. 4. Diffuse coronary calcifications noted.   Electronically Signed   By: JGarald BaldingM.D.   On: 12/14/2012 03:43   Dg Chest Port 1 View  12/14/2012   CLINICAL DATA:  Lower bilateral chest pain.  EXAM: PORTABLE CHEST - 1 VIEW  COMPARISON:  Chest radiograph from 04/30/2012, and CT of the chest performed 05/05/2012  FINDINGS: The lungs are well-aerated. Pulmonary vascularity is at the upper limits of normal. There is no evidence of focal opacification, pleural effusion or  pneumothorax.  The cardiomediastinal  silhouette is borderline normal in size. No acute osseous abnormalities are seen.  IMPRESSION: No acute cardiopulmonary process seen.   Electronically Signed   By: Garald Balding M.D.   On: 12/14/2012 01:57    Microbiology: No results found for this or any previous visit (from the past 240 hour(s)).   Labs: Basic Metabolic Panel:  Recent Labs Lab 12/14/12 0055  NA 137  K 3.6  CL 101  CO2 28  GLUCOSE 114*  BUN 16  CREATININE 1.35  CALCIUM 9.2   Liver Function Tests:  Recent Labs Lab 12/14/12 0055  AST 20  ALT 15  ALKPHOS 94  BILITOT 0.5  PROT 7.3  ALBUMIN 3.5    Recent Labs Lab 12/14/12 0055  LIPASE 40   No results found for this basename: AMMONIA,  in the last 168 hours CBC:  Recent Labs Lab 12/14/12 0055  WBC 8.1  NEUTROABS 3.9  HGB 14.0  HCT 41.7  MCV 88.2  PLT 215   Cardiac Enzymes:  Recent Labs Lab 12/14/12 0055 12/14/12 0738 12/14/12 1314  TROPONINI <0.30 <0.30 <0.30   BNP: BNP (last 3 results) No results found for this basename: PROBNP,  in the last 8760 hours CBG: No results found for this basename: GLUCAP,  in the last 168 hours  Time coordinating discharge: 45 minutes  Signed:  Isidra Mings  [facility] 12/14/2012, 1:58 PM

## 2012-12-14 NOTE — ED Notes (Signed)
Pt lower bilateral chest pain starting at roughly 2300. Denies N/V. States the pain makes it difficult to take a deep breath "It's cutting my air off it hurts so bad". States he has had an MI before but that this is a different pain. Reports chronic dizziness "when I get up too fast"

## 2012-12-15 ENCOUNTER — Ambulatory Visit: Payer: Medicare Other | Admitting: Gastroenterology

## 2012-12-16 ENCOUNTER — Ambulatory Visit: Payer: Medicare Other | Admitting: Gastroenterology

## 2012-12-31 ENCOUNTER — Emergency Department (HOSPITAL_COMMUNITY): Payer: No Typology Code available for payment source

## 2012-12-31 ENCOUNTER — Encounter (HOSPITAL_COMMUNITY): Payer: Self-pay | Admitting: Emergency Medicine

## 2012-12-31 ENCOUNTER — Emergency Department (HOSPITAL_COMMUNITY)
Admission: EM | Admit: 2012-12-31 | Discharge: 2012-12-31 | Disposition: A | Discharge status: Home / Self Care | Payer: No Typology Code available for payment source | Attending: Emergency Medicine | Admitting: Emergency Medicine

## 2012-12-31 DIAGNOSIS — J4489 Other specified chronic obstructive pulmonary disease: Secondary | ICD-10-CM | POA: Insufficient documentation

## 2012-12-31 DIAGNOSIS — S60219A Contusion of unspecified wrist, initial encounter: Secondary | ICD-10-CM | POA: Insufficient documentation

## 2012-12-31 DIAGNOSIS — S8002XA Contusion of left knee, initial encounter: Secondary | ICD-10-CM

## 2012-12-31 DIAGNOSIS — Z87448 Personal history of other diseases of urinary system: Secondary | ICD-10-CM | POA: Insufficient documentation

## 2012-12-31 DIAGNOSIS — Z8669 Personal history of other diseases of the nervous system and sense organs: Secondary | ICD-10-CM | POA: Insufficient documentation

## 2012-12-31 DIAGNOSIS — Z7982 Long term (current) use of aspirin: Secondary | ICD-10-CM | POA: Insufficient documentation

## 2012-12-31 DIAGNOSIS — S5010XA Contusion of unspecified forearm, initial encounter: Secondary | ICD-10-CM | POA: Insufficient documentation

## 2012-12-31 DIAGNOSIS — Z8619 Personal history of other infectious and parasitic diseases: Secondary | ICD-10-CM | POA: Insufficient documentation

## 2012-12-31 DIAGNOSIS — S8000XA Contusion of unspecified knee, initial encounter: Secondary | ICD-10-CM | POA: Insufficient documentation

## 2012-12-31 DIAGNOSIS — S5011XA Contusion of right forearm, initial encounter: Secondary | ICD-10-CM

## 2012-12-31 DIAGNOSIS — S12400A Unspecified displaced fracture of fifth cervical vertebra, initial encounter for closed fracture: Secondary | ICD-10-CM | POA: Insufficient documentation

## 2012-12-31 DIAGNOSIS — Z79899 Other long term (current) drug therapy: Secondary | ICD-10-CM | POA: Insufficient documentation

## 2012-12-31 DIAGNOSIS — Z87442 Personal history of urinary calculi: Secondary | ICD-10-CM | POA: Insufficient documentation

## 2012-12-31 DIAGNOSIS — I10 Essential (primary) hypertension: Secondary | ICD-10-CM | POA: Insufficient documentation

## 2012-12-31 DIAGNOSIS — I2510 Atherosclerotic heart disease of native coronary artery without angina pectoris: Secondary | ICD-10-CM | POA: Insufficient documentation

## 2012-12-31 DIAGNOSIS — S3981XA Other specified injuries of abdomen, initial encounter: Secondary | ICD-10-CM | POA: Insufficient documentation

## 2012-12-31 DIAGNOSIS — K219 Gastro-esophageal reflux disease without esophagitis: Secondary | ICD-10-CM | POA: Insufficient documentation

## 2012-12-31 DIAGNOSIS — Z8739 Personal history of other diseases of the musculoskeletal system and connective tissue: Secondary | ICD-10-CM | POA: Insufficient documentation

## 2012-12-31 DIAGNOSIS — Z87891 Personal history of nicotine dependence: Secondary | ICD-10-CM | POA: Insufficient documentation

## 2012-12-31 DIAGNOSIS — S60212A Contusion of left wrist, initial encounter: Secondary | ICD-10-CM

## 2012-12-31 DIAGNOSIS — Y9389 Activity, other specified: Secondary | ICD-10-CM | POA: Insufficient documentation

## 2012-12-31 DIAGNOSIS — E785 Hyperlipidemia, unspecified: Secondary | ICD-10-CM | POA: Insufficient documentation

## 2012-12-31 DIAGNOSIS — S129XXA Fracture of neck, unspecified, initial encounter: Secondary | ICD-10-CM

## 2012-12-31 DIAGNOSIS — J449 Chronic obstructive pulmonary disease, unspecified: Secondary | ICD-10-CM | POA: Insufficient documentation

## 2012-12-31 DIAGNOSIS — Y92410 Unspecified street and highway as the place of occurrence of the external cause: Secondary | ICD-10-CM | POA: Insufficient documentation

## 2012-12-31 DIAGNOSIS — Z9861 Coronary angioplasty status: Secondary | ICD-10-CM | POA: Insufficient documentation

## 2012-12-31 LAB — CBC
HCT: 43.8 % (ref 39.0–52.0)
Hemoglobin: 14.8 g/dL (ref 13.0–17.0)
MCH: 29.8 pg (ref 26.0–34.0)
MCHC: 33.8 g/dL (ref 30.0–36.0)
MCV: 88.1 fL (ref 78.0–100.0)
Platelets: 213 10*3/uL (ref 150–400)
RBC: 4.97 MIL/uL (ref 4.22–5.81)
RDW: 13.5 % (ref 11.5–15.5)
WBC: 8.2 10*3/uL (ref 4.0–10.5)

## 2012-12-31 LAB — PROTIME-INR
INR: 1.06 (ref 0.00–1.49)
Prothrombin Time: 13.6 seconds (ref 11.6–15.2)

## 2012-12-31 LAB — POCT I-STAT, CHEM 8
BUN: 10 mg/dL (ref 6–23)
Calcium, Ion: 1.24 mmol/L (ref 1.13–1.30)
Chloride: 105 mEq/L (ref 96–112)
Creatinine, Ser: 1.1 mg/dL (ref 0.50–1.35)
Glucose, Bld: 104 mg/dL — ABNORMAL HIGH (ref 70–99)
HCT: 48 % (ref 39.0–52.0)
Hemoglobin: 16.3 g/dL (ref 13.0–17.0)
Potassium: 3.7 mEq/L (ref 3.5–5.1)
Sodium: 147 mEq/L — ABNORMAL HIGH (ref 135–145)
TCO2: 26 mmol/L (ref 0–100)

## 2012-12-31 LAB — ETHANOL: Alcohol, Ethyl (B): <11 mg/dL (ref 0–11)

## 2012-12-31 LAB — POCT I-STAT CREATININE: Creatinine, Ser: 1.1 mg/dL (ref 0.50–1.35)

## 2012-12-31 MED ORDER — TETANUS-DIPHTH-ACELL PERTUSSIS 5-2.5-18.5 LF-MCG/0.5 IM SUSP
0.5000 mL | Freq: Once | INTRAMUSCULAR | Status: DC
  Filled 2012-12-31: qty 0.5

## 2012-12-31 MED ORDER — MORPHINE SULFATE 4 MG/ML IJ SOLN
4.0000 mg | Freq: Once | INTRAMUSCULAR | Status: AC
  Administered 2012-12-31: 4 mg via INTRAVENOUS
  Filled 2012-12-31: qty 1

## 2012-12-31 MED ORDER — IOHEXOL 300 MG/ML  SOLN
100.0000 mL | Freq: Once | INTRAMUSCULAR | Status: AC | PRN
  Administered 2012-12-31: 100 mL via INTRAVENOUS

## 2012-12-31 MED ORDER — OXYCODONE-ACETAMINOPHEN 5-325 MG PO TABS: 1.0000 | ORAL_TABLET | ORAL | Status: DC | PRN

## 2012-12-31 NOTE — ED Notes (Signed)
Pt alert & oriented x4, stable gait. Patient given discharge instructions, paperwork & prescription(s). Patient  instructed to stop at the registration desk to finish any additional paperwork. Patient verbalized understanding. Pt left department w/ no further questions. 

## 2012-12-31 NOTE — ED Notes (Signed)
Pt. Was ambulated did very good complained of being sore walked around the nurse desk once and back to room.
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
## 2013-01-03 ENCOUNTER — Emergency Department (HOSPITAL_COMMUNITY): Payer: Medicare Other

## 2013-01-03 ENCOUNTER — Encounter (HOSPITAL_COMMUNITY): Payer: Self-pay | Admitting: Emergency Medicine

## 2013-01-03 ENCOUNTER — Emergency Department (HOSPITAL_COMMUNITY)
Admission: EM | Admit: 2013-01-03 | Discharge: 2013-01-03 | Disposition: A | Discharge status: Home / Self Care | Payer: Medicare Other | Attending: Emergency Medicine | Admitting: Emergency Medicine

## 2013-01-03 DIAGNOSIS — Z8711 Personal history of peptic ulcer disease: Secondary | ICD-10-CM | POA: Insufficient documentation

## 2013-01-03 DIAGNOSIS — K5289 Other specified noninfective gastroenteritis and colitis: Secondary | ICD-10-CM | POA: Insufficient documentation

## 2013-01-03 DIAGNOSIS — J449 Chronic obstructive pulmonary disease, unspecified: Secondary | ICD-10-CM | POA: Insufficient documentation

## 2013-01-03 DIAGNOSIS — Z9889 Other specified postprocedural states: Secondary | ICD-10-CM | POA: Insufficient documentation

## 2013-01-03 DIAGNOSIS — Z87891 Personal history of nicotine dependence: Secondary | ICD-10-CM | POA: Insufficient documentation

## 2013-01-03 DIAGNOSIS — I2510 Atherosclerotic heart disease of native coronary artery without angina pectoris: Secondary | ICD-10-CM | POA: Insufficient documentation

## 2013-01-03 DIAGNOSIS — Z8669 Personal history of other diseases of the nervous system and sense organs: Secondary | ICD-10-CM | POA: Insufficient documentation

## 2013-01-03 DIAGNOSIS — Z860100 Personal history of colon polyps, unspecified: Secondary | ICD-10-CM | POA: Insufficient documentation

## 2013-01-03 DIAGNOSIS — Z7982 Long term (current) use of aspirin: Secondary | ICD-10-CM | POA: Insufficient documentation

## 2013-01-03 DIAGNOSIS — Z8619 Personal history of other infectious and parasitic diseases: Secondary | ICD-10-CM | POA: Insufficient documentation

## 2013-01-03 DIAGNOSIS — I10 Essential (primary) hypertension: Secondary | ICD-10-CM | POA: Insufficient documentation

## 2013-01-03 DIAGNOSIS — Z87442 Personal history of urinary calculi: Secondary | ICD-10-CM | POA: Insufficient documentation

## 2013-01-03 DIAGNOSIS — E785 Hyperlipidemia, unspecified: Secondary | ICD-10-CM | POA: Insufficient documentation

## 2013-01-03 DIAGNOSIS — J4489 Other specified chronic obstructive pulmonary disease: Secondary | ICD-10-CM | POA: Insufficient documentation

## 2013-01-03 DIAGNOSIS — K6389 Other specified diseases of intestine: Secondary | ICD-10-CM | POA: Insufficient documentation

## 2013-01-03 DIAGNOSIS — Z9861 Coronary angioplasty status: Secondary | ICD-10-CM | POA: Insufficient documentation

## 2013-01-03 DIAGNOSIS — N400 Enlarged prostate without lower urinary tract symptoms: Secondary | ICD-10-CM | POA: Insufficient documentation

## 2013-01-03 DIAGNOSIS — Y92410 Unspecified street and highway as the place of occurrence of the external cause: Secondary | ICD-10-CM | POA: Insufficient documentation

## 2013-01-03 DIAGNOSIS — S27329A Contusion of lung, unspecified, initial encounter: Secondary | ICD-10-CM

## 2013-01-03 DIAGNOSIS — Y9389 Activity, other specified: Secondary | ICD-10-CM | POA: Insufficient documentation

## 2013-01-03 DIAGNOSIS — M1990 Unspecified osteoarthritis, unspecified site: Secondary | ICD-10-CM | POA: Insufficient documentation

## 2013-01-03 DIAGNOSIS — Z79899 Other long term (current) drug therapy: Secondary | ICD-10-CM | POA: Insufficient documentation

## 2013-01-03 LAB — CBC WITH DIFFERENTIAL/PLATELET
Basophils Absolute: 0.1 10*3/uL (ref 0.0–0.1)
Basophils Relative: 1 % (ref 0–1)
Eosinophils Absolute: 0.3 10*3/uL (ref 0.0–0.7)
Eosinophils Relative: 4 % (ref 0–5)
HCT: 38.5 % — ABNORMAL LOW (ref 39.0–52.0)
Hemoglobin: 12.8 g/dL — ABNORMAL LOW (ref 13.0–17.0)
Lymphocytes Relative: 29 % (ref 12–46)
Lymphs Abs: 2.2 10*3/uL (ref 0.7–4.0)
MCH: 29 pg (ref 26.0–34.0)
MCHC: 33.2 g/dL (ref 30.0–36.0)
MCV: 87.1 fL (ref 78.0–100.0)
Monocytes Absolute: 0.9 10*3/uL (ref 0.1–1.0)
Monocytes Relative: 11 % (ref 3–12)
Neutro Abs: 4.2 10*3/uL (ref 1.7–7.7)
Neutrophils Relative %: 56 % (ref 43–77)
Platelets: 210 10*3/uL (ref 150–400)
RBC: 4.42 MIL/uL (ref 4.22–5.81)
RDW: 13.3 % (ref 11.5–15.5)
WBC: 7.6 10*3/uL (ref 4.0–10.5)

## 2013-01-03 LAB — COMPREHENSIVE METABOLIC PANEL
ALT: 14 U/L (ref 0–53)
AST: 23 U/L (ref 0–37)
Albumin: 3 g/dL — ABNORMAL LOW (ref 3.5–5.2)
Alkaline Phosphatase: 76 U/L (ref 39–117)
BUN: 19 mg/dL (ref 6–23)
CO2: 26 mEq/L (ref 19–32)
Calcium: 9 mg/dL (ref 8.4–10.5)
Chloride: 103 mEq/L (ref 96–112)
Creatinine, Ser: 1.04 mg/dL (ref 0.50–1.35)
GFR calc Af Amer: 80 mL/min — ABNORMAL LOW (ref >90)
GFR calc non Af Amer: 69 mL/min — ABNORMAL LOW (ref >90)
Glucose, Bld: 145 mg/dL — ABNORMAL HIGH (ref 70–99)
Potassium: 3.4 mEq/L — ABNORMAL LOW (ref 3.5–5.1)
Sodium: 139 mEq/L (ref 135–145)
Total Bilirubin: 0.9 mg/dL (ref 0.3–1.2)
Total Protein: 6.9 g/dL (ref 6.0–8.3)

## 2013-01-03 LAB — LIPASE, BLOOD: Lipase: 18 U/L (ref 11–59)

## 2013-01-03 MED ORDER — DIPHENHYDRAMINE HCL 50 MG/ML IJ SOLN
12.5000 mg | Freq: Once | INTRAMUSCULAR | Status: AC
  Administered 2013-01-03: 12.5 mg via INTRAVENOUS

## 2013-01-03 MED ORDER — OXYCODONE-ACETAMINOPHEN 5-325 MG PO TABS: 1.0000 | ORAL_TABLET | ORAL | Status: DC | PRN

## 2013-01-03 MED ORDER — DIPHENHYDRAMINE HCL 50 MG/ML IJ SOLN
INTRAMUSCULAR | Status: AC
  Filled 2013-01-03: qty 1

## 2013-01-03 MED ORDER — IOHEXOL 300 MG/ML  SOLN
100.0000 mL | Freq: Once | INTRAMUSCULAR | Status: AC | PRN
  Administered 2013-01-03: 100 mL via INTRAVENOUS

## 2013-01-03 MED ORDER — MORPHINE SULFATE 4 MG/ML IJ SOLN
4.0000 mg | Freq: Once | INTRAMUSCULAR | Status: AC
  Administered 2013-01-03: 4 mg via INTRAVENOUS
  Filled 2013-01-03: qty 1

## 2013-01-03 MED ORDER — ONDANSETRON HCL 4 MG/2ML IJ SOLN
4.0000 mg | Freq: Once | INTRAMUSCULAR | Status: AC
  Administered 2013-01-03: 4 mg via INTRAVENOUS
  Filled 2013-01-03: qty 2
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
## 2013-01-03 NOTE — ED Notes (Signed)
Pt alert & oriented x4, stable gait. Patient given discharge instructions, paperwork & prescription(s). Patient  instructed to stop at the registration desk to finish any additional paperwork. Patient verbalized understanding. Pt left department w/ no further questions. 
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
## 2013-01-05 ENCOUNTER — Telehealth (INDEPENDENT_AMBULATORY_CARE_PROVIDER_SITE_OTHER): Payer: Self-pay | Admitting: Orthopedic Surgery

## 2013-01-05 LAB — GLUCOSE, CAPILLARY: Glucose-Capillary: 121 mg/dL — ABNORMAL HIGH (ref 70–99)

## 2013-01-05 NOTE — Telephone Encounter (Signed)
Spoke to patient and offered appt in trauma clinic but said he could see PCP instead if it was more convenient for him rather than coming down here. He elected to do that and I instructed him to call back if he couldn't get an appt by the end of next week.
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
## 2013-01-06 ENCOUNTER — Ambulatory Visit: Payer: Medicare Other | Admitting: Gastroenterology

## 2013-01-07 ENCOUNTER — Ambulatory Visit: Payer: Medicare Other | Admitting: Family Medicine

## 2013-01-07 ENCOUNTER — Other Ambulatory Visit: Payer: Self-pay | Admitting: Family Medicine

## 2013-01-09 ENCOUNTER — Encounter: Payer: Self-pay | Admitting: Cardiovascular Disease

## 2013-01-09 ENCOUNTER — Ambulatory Visit (INDEPENDENT_AMBULATORY_CARE_PROVIDER_SITE_OTHER): Payer: Medicare Other | Admitting: Cardiovascular Disease

## 2013-01-09 VITALS — BP 148/80 | HR 64 | Ht 73.0 in | Wt 278.0 lb

## 2013-01-09 DIAGNOSIS — I2510 Atherosclerotic heart disease of native coronary artery without angina pectoris: Secondary | ICD-10-CM

## 2013-01-09 DIAGNOSIS — E785 Hyperlipidemia, unspecified: Secondary | ICD-10-CM | POA: Insufficient documentation

## 2013-01-09 DIAGNOSIS — I10 Essential (primary) hypertension: Secondary | ICD-10-CM | POA: Insufficient documentation

## 2013-01-09 NOTE — Patient Instructions (Signed)
Your physician wants you to follow-up in: 1 year with Dr Berry. You will receive a reminder letter in the mail two months in advance. If you don't receive a letter, please call our office to schedule the follow-up appointment.  

## 2013-01-09 NOTE — Assessment & Plan Note (Signed)
Status post LAD stenting by myself 01/28/95 in the setting of acute anterior wall myocardial infarction. His last 2-D echo performed 3 years ago revealed normal LV systolic function and a stress Myoview stress test performed at the same time showed no evidence of ischemia or scar. He otherwise is asymptomatic.

## 2013-01-09 NOTE — Progress Notes (Signed)
01/09/2013 [name]   1940/02/07  [id number]  Primary Physician Sallee Lange, MD Primary Cardiologist: Lorretta Harp MD Renae Gloss   HPI:  The patient is a 72 year old, moderately overweight, widowed Caucasian male, father of 71, grandfather of 71 grandchildren, whom I last saw on April 22, 2010. He has a history of CAD, status post PCI and stenting of his LAD by me on January 28, 1995, in the setting of an acute anterior wall myocardial infarction. His other problems include hypertension, hyperlipidemia, and obstructive sleep apnea on CPAP. He denies chest pain or shortness of breath. He had a 2D echocardiogram in 2007 which revealed normal LV function. His last Myoview performed on July 21, 2009, was normal. Dr.-looking phalluses lipid profile closely which was recently performed 11/26/12 revealed a total cholesterol of 144, LDL 77 and HDL of 48. He denies chest pain or shortness of breath. He recently had noted a glass that and had an EKG performed at that time which was unremarkable.   Current Outpatient Prescriptions  Medication Sig Dispense Refill  . amLODipine (NORVASC) 10 MG tablet Take 10 mg by mouth daily.      Marland Kitchen aspirin 81 MG tablet Take 162 mg by mouth daily.        Marland Kitchen doxazosin (CARDURA) 8 MG tablet Take 4 mg by mouth daily.      . furosemide (LASIX) 40 MG tablet Take 40 mg by mouth daily.      Marland Kitchen losartan (COZAAR) 50 MG tablet Take 50 mg by mouth daily.      . mesalamine (LIALDA) 1.2 G EC tablet Take 4 tablets (4.8 g total) by mouth daily.  120 tablet  11  . metoprolol (LOPRESSOR) 50 MG tablet TAKE 1 TABLET 3 TIMES A DAY  90 tablet  5  . nitroGLYCERIN (NITROSTAT) 0.4 MG SL tablet Place 0.4 mg under the tongue every 5 (five) minutes as needed for chest pain.      Marland Kitchen oxybutynin (DITROPAN XL) 10 MG 24 hr tablet Take 10 mg by mouth at bedtime.      Marland Kitchen oxyCODONE-acetaminophen (PERCOCET/ROXICET) 5-325 MG per tablet Take 1 tablet by mouth every 4 (four) hours as  needed for severe pain.  15 tablet  0  . pravastatin (PRAVACHOL) 40 MG tablet TAKE 1 TABLET BY MOUTH EVERY DAY  90 tablet  0   No current facility-administered medications for this visit.    No Known Allergies  History   Social History  . Marital Status: Widowed    Spouse Name: [name]    Number of Children: [name]  . Years of Education: [name]   Occupational History  . maintenance     retired   Social History Main Topics  . Smoking status: Former Research scientist (life sciences)  . Smokeless tobacco: Never Used  . Alcohol Use: No  . Drug Use: No  . Sexual Activity: Yes    Partners: Female    Patent examiner Protection: None     Comment: spouse   Other Topics Concern  . Not on file   Social History Narrative  . No narrative on file     Review of Systems: General: negative for chills, fever, night sweats or weight changes.  Cardiovascular: negative for chest pain, dyspnea on exertion, edema, orthopnea, palpitations, paroxysmal nocturnal dyspnea or shortness of breath Dermatological: negative for rash Respiratory: negative for cough or wheezing Urologic: negative for hematuria Abdominal: negative for nausea, vomiting, diarrhea, bright red blood per rectum, melena, or hematemesis Neurologic:  negative for visual changes, syncope, or dizziness All other systems reviewed and are otherwise negative except as noted above.    Blood pressure 148/80, pulse 64, height 6' 1"  (1.854 m), weight 278 lb (126.1 kg).  General appearance: alert and no distress Neck: no adenopathy, no carotid bruit, no JVD, supple, symmetrical, trachea midline and thyroid not enlarged, symmetric, no tenderness/mass/nodules Lungs: clear to auscultation bilaterally Heart: regular rate and rhythm, S1, S2 normal, no murmur, click, rub or gallop Extremities: extremities normal, atraumatic, no cyanosis or edema  EKG not performed today  ASSESSMENT AND PLAN:   CAD (coronary artery disease), native coronary artery Status post LAD  stenting by myself 01/28/95 in the setting of acute anterior wall myocardial infarction. His last 2-D echo performed 3 years ago revealed normal LV systolic function and a stress Myoview stress test performed at the same time showed no evidence of ischemia or scar. He otherwise is asymptomatic.  Essential hypertension Controlled on current medications  Hyperlipidemia Recent lipid profile performed 11/24/12 revealed a glucose of 144, LDL of 77 and HDL of 40      Lorretta Harp MD [facility], [facility] 01/09/2013 10:36 AM

## 2013-01-09 NOTE — Assessment & Plan Note (Signed)
Recent lipid profile performed 11/24/12 revealed a glucose of 144, LDL of 77 and HDL of 40

## 2013-01-09 NOTE — Assessment & Plan Note (Signed)
Controlled on current medications 

## 2013-01-15 ENCOUNTER — Ambulatory Visit (INDEPENDENT_AMBULATORY_CARE_PROVIDER_SITE_OTHER): Payer: Medicare Other | Admitting: Family Medicine

## 2013-01-15 ENCOUNTER — Encounter: Payer: Self-pay | Admitting: Family Medicine

## 2013-01-15 VITALS — BP 132/70 | Ht 73.0 in | Wt 280.0 lb

## 2013-01-15 DIAGNOSIS — M542 Cervicalgia: Secondary | ICD-10-CM
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
## 2013-01-19 ENCOUNTER — Ambulatory Visit: Payer: Medicare Other | Admitting: Gastroenterology

## 2013-02-17 ENCOUNTER — Encounter (INDEPENDENT_AMBULATORY_CARE_PROVIDER_SITE_OTHER): Payer: Self-pay

## 2013-02-17 ENCOUNTER — Ambulatory Visit (INDEPENDENT_AMBULATORY_CARE_PROVIDER_SITE_OTHER): Payer: Medicare Other | Admitting: Gastroenterology

## 2013-02-17 ENCOUNTER — Encounter: Payer: Self-pay | Admitting: Gastroenterology

## 2013-02-17 VITALS — BP 146/79 | HR 58 | Temp 98.3°F | Wt 273.0 lb

## 2013-02-17 DIAGNOSIS — K5150 Left sided colitis without complications: Secondary | ICD-10-CM

## 2013-02-17 MED ORDER — MESALAMINE 1000 MG RE SUPP: 1000.0000 mg | Freq: Every day | RECTAL | Status: DC

## 2013-02-17 NOTE — Patient Instructions (Signed)
1. Make sure you take four Lialda every day. You can take all at the same time or two twice per day. 2. Start Canasa suppositories one at bedtime for next six weeks. We have provided you with two weeks of samples and I sent RX to your pharmacy for the remaining four weeks. Call if you have problems getting the medication. 3. Please call me if your stools do not improve. We may consider prednisone at that time. 4. Office visit in four weeks with Dr. Gala Romney.

## 2013-02-17 NOTE — Assessment & Plan Note (Signed)
Left-sided ulcerative colitis originally diagnosed in 2012. He continues to have ongoing diarrhea associated with fecal urgency and incontinence. It has been very difficult to obtain remission over the course the last one year due to patient's noncompliance with medications. He has been "stretching out his prescriptions and samples" due to financial issues. He was unable to afford a cort enemas but he did not call and let us know that. He denies any improvement previously on Uceris. Again addressed issues/importance of taking his maintenance medication. We will provide samples as available but he needs to take initiative and ensuring that he's on his medication regularly. We will add Canasa suppositories to his regimen today, 2 weeks of samples provided and prescription provided for an additional 4 weeks (appears to be on his formulary). He will call if he has any difficulty obtaining the medication. If he shows no signs of improvement after one to 2 weeks, he is to let me know. Otherwise he will come back in 4 weeks to see Dr. Gala Romney.

## 2013-02-17 NOTE — Progress Notes (Signed)
cc'd to pcp 
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
## 2013-02-28 ENCOUNTER — Other Ambulatory Visit: Payer: Self-pay | Admitting: Family Medicine

## 2013-03-10 ENCOUNTER — Telehealth: Payer: Self-pay

## 2013-03-10 DIAGNOSIS — R197 Diarrhea, unspecified: Secondary | ICD-10-CM

## 2013-03-10 NOTE — Telephone Encounter (Signed)
Pt came by the office requesting lialda samples. Gave pt #9 boxes of lialda. Pt stated he was taking 4 every morning and using the canasa suppositories and he is not any better. He is still having bleeding with bm's. He has sharp, jabbing pains in his lower abd when he has a bm. No fever, no nausea, no vomiting. Pt has an appointment on 03/20/13 with RMR.   He wants to know if there is anything else he can do until his ov?

## 2013-03-11 NOTE — Telephone Encounter (Signed)
Would urge compliance with medication. He needs to take the recommended dose of Lialda daily. Let send a stool sample for C. difficile as well as. Keep appointment as planned.

## 2013-03-12 ENCOUNTER — Other Ambulatory Visit: Payer: Self-pay

## 2013-03-12 DIAGNOSIS — R197 Diarrhea, unspecified: Secondary | ICD-10-CM

## 2013-03-12 NOTE — Telephone Encounter (Signed)
Pt aware. Will come by Monday and pick up container. Container and lab order at the front desk.

## 2013-03-17 ENCOUNTER — Encounter: Payer: Self-pay | Admitting: Gastroenterology

## 2013-03-17 NOTE — Progress Notes (Signed)
REVIEWED.  

## 2013-03-20 ENCOUNTER — Ambulatory Visit (INDEPENDENT_AMBULATORY_CARE_PROVIDER_SITE_OTHER): Payer: Medicare Other | Admitting: Internal Medicine

## 2013-03-20 ENCOUNTER — Encounter: Payer: Self-pay | Admitting: Internal Medicine

## 2013-03-20 VITALS — BP 147/74 | HR 54 | Temp 98.4°F | Ht 71.0 in | Wt 275.4 lb

## 2013-03-20 DIAGNOSIS — K5190 Ulcerative colitis, unspecified, without complications: Secondary | ICD-10-CM

## 2013-03-20 MED ORDER — BUDESONIDE 9 MG PO TB24: 9.0000 mg | ORAL_TABLET | Freq: Every day | ORAL | Status: DC

## 2013-03-20 MED ORDER — BALSALAZIDE DISODIUM 750 MG PO CAPS: 2250.0000 mg | ORAL_CAPSULE | Freq: Three times a day (TID) | ORAL | Status: DC

## 2013-03-20 NOTE — Progress Notes (Signed)
Primary Care Physician:  Sallee Lange, MD Primary Gastroenterologist:  Dr. Gala Romney  Pre-Procedure History & Physical: HPI:  [name] is a 73 y.o. male here for  followup of left sided proctocolitis. Patient's has ongoing symptoms consistent with tenesmus, postprandial, cramps diarrhea and bouts of fecal incontinence. Unfortunately, when patient interviewed, it is clear that he really has not been compliant with anything that we have prescribed through this office. Is been some time since he got an IBD a prescription filled from his pharmacy as he reports. We have given him samples as they have been available,  however, we have not been able to supply him with all of his medications and, therefore, he has not been compliant with our previously recommended regimen.  Past Medical History  Diagnosis Date  . Hypertension   . DJD (degenerative joint disease)   . GERD (gastroesophageal reflux disease)   . Hyperlipidemia   . BPH (benign prostatic hyperplasia)   . Obstructive sleep apnea   . Diverticulosis   . Gastric ulcer 04/17/10    Three 48m gastric ulcers, H.pylori serologies were negative  . Clostridium difficile colitis 04/2005  . Idiopathic chronic inflammatory bowel disease 05/18/2010    left-sided UC  . S/P endoscopy 07/24/10    retained gastric contents, benign bx  . CAD (coronary artery disease) 01/1995  . Reflux 02/1995  . Colitis 2011  . Kidney stone   . COPD (chronic obstructive pulmonary disease)   . Asthma     Past Surgical History  Procedure Laterality Date  . Colonoscopy  04/2005    granularity and friability erosions from rectum to 40cm. Bx infection vs IBD. C. Diff positive at the time.   . Knee surgery  two  . Shoulder surgery  two  . Foot surgery  two  . Tonsillectomy    . Cervical spine surgery      C4-5  . Colonoscopy  05/2010    Manha Amato: left-sided UC, bx with no dysplasia, shallow diverticula  . Coronary stent placement  01/1995  .  Esophagogastroduodenoscopy  07/24/2010    RLFY:BOFBPZesophagus  . Colonoscopy [name] 11/07/2012    RWCH:ENID-POEUMproctocolitis status post segmental biopsy/Sigmoid colon polyps removed as described above. Procedure compromisd by technical difficulties  . Cardiovascular stress test  07/21/2009    No scintigraphic evidence of inducible myocardial ischemia  . Transthoracic echocardiogram  03/23/2009    EF 60-65%, normal LV systolic function    Prior to Admission medications   Medication Sig Start Date End Date Taking? Authorizing Provider  amLODipine (NORVASC) 10 MG tablet Take 10 mg by mouth daily.   Yes Historical Provider, MD  aspirin 81 MG tablet Take 162 mg by mouth daily.     Yes Historical Provider, MD  furosemide (LASIX) 40 MG tablet Take 40 mg by mouth daily.   Yes Historical Provider, MD  losartan (COZAAR) 50 MG tablet TAKE 1 TABLET EVERY DAY 02/28/13  Yes SKathyrn Drown MD  mesalamine (LIALDA) 1.2 G EC tablet Take 4 tablets (4.8 g total) by mouth daily. 10/27/12  Yes LMahala Menghini PA-C  metoprolol (LOPRESSOR) 50 MG tablet TAKE 1 TABLET 3 TIMES A DAY 01/07/13  Yes SKathyrn Drown MD  nitroGLYCERIN (NITROSTAT) 0.4 MG SL tablet Place 0.4 mg under the tongue every 5 (five) minutes as needed for chest pain.   Yes Historical Provider, MD  oxybutynin (DITROPAN XL) 10 MG 24 hr tablet Take 10 mg by mouth at bedtime.   Yes Historical Provider, MD  pravastatin (PRAVACHOL) 40 MG tablet TAKE 1 TABLET BY MOUTH EVERY DAY 01/07/13  Yes Mikey Kirschner, MD  tamsulosin [facility]) 0.4 MG CAPS capsule  01/10/13  Yes Historical Provider, MD  mesalamine (CANASA) 1000 MG suppository Place 1 suppository (1,000 mg total) rectally at bedtime. 02/17/13   Mahala Menghini, PA-C    Allergies as of 03/20/2013  . (No Known Allergies)    Family History  Problem Relation Age of Onset  . Lung cancer Mother   . Cancer Mother     breast  . Diabetes Mother   . Stroke Father   . Hypertension Father   . Colon cancer Neg  Hx     History   Social History  . Marital Status: Widowed    Spouse Name: [name]    Number of Children: [name]  . Years of Education: [name]   Occupational History  . maintenance     retired   Social History Main Topics  . Smoking status: Former Research scientist (life sciences)  . Smokeless tobacco: Former Systems developer    Quit date: 01/15/1970  . Alcohol Use: No  . Drug Use: No  . Sexual Activity: Yes    Partners: Female    Patent examiner Protection: None     Comment: spouse   Other Topics Concern  . Not on file   Social History Narrative  . No narrative on file    Review of Systems: See HPI, otherwise negative ROS  Physical Exam: BP 147/74  Pulse 54  Temp(Src) 98.4 F (36.9 C) (Oral)  Ht 5' 11"  (1.803 m)  Wt 275 lb 6.4 oz (124.921 kg)  BMI 38.43 kg/m2 General:   Alert,  obese, pleasant and cooperative in NAD, accompanied by a male friend Skin:  Intact without significant lesions or rashes. Eyes:  Sclera clear, no icterus.   Conjunctiva pink. Ears:  Normal auditory acuity. Nose:  No deformity, discharge,  or lesions. Mouth:  No deformity or lesions. Neck:  Supple; no masses or thyromegaly. No significant cervical adenopathy. Lungs:  Clear throughout to auscultation.   No wheezes, crackles, or rhonchi. No acute distress. Heart:  Regular rate and rhythm; no murmurs, clicks, rubs,  or gallops. Abdomen: Obese., normal bowel sounds.  Soft with very minimal left lower quadrant tenderness to deep palpation. Appreciable mass or organomegaly. No guarding. Pulses:  Normal pulses noted. Extremities:  Without clubbing or edema.  Impression: Well documented left sided proctocolitis. Continues to have some symptoms of tenesmus,, rectal bleeding diarrhea and incontinence .   It is my assessment that his disease process has never been adequately treated largely because of patient's noncompliance. Noncompliance has been a major problem largely due to the cost of needed medication. In addition, he really has a tough  time, given his body habitus, self administering  any sort of topical treatment.  He finally submitted a stool sample today to doublecheck his C. difficile status. He does not take any significant nonsteroidals which could undermine treatment we are recommending to gain a remission.  Recommendations:  Stop Lialda; Begin Colazal 2.25  Grams 3x Times a Day (this agent may have a little more efficacy for left-sided disease). Prescription Provided.  Strongly urged compliance. Also, resume Uceris 9 mg daily-prescription provided.  May continue to use Imodium when necessary for symptomatic control of diarrhea. Office visit with [place] in 6 weeks.  Will request [facility] case management consultation to see if we can get any assistance with patient's needed treatment  Followup on pending C. difficile toxin  assay.  Office visit here in 6 weeks

## 2013-03-20 NOTE — Patient Instructions (Signed)
Stop Lialda; begin Colazal  2.25 g 3 times a day (3 tablets 3 times a day). It is very important to take his medication as prescribed.  Resume Uceris 9 mg daily. Samples given. Prescription provided. It is also very important for you to take his medication as directed. If you don't, your symptoms are not likely to improve.  May use Imodium as needed for symptomatic treatment of diarrhea  Office visit here in 6 weeks.

## 2013-03-20 NOTE — Progress Notes (Signed)
[facility] Referral has been made and they will contact Mr. Hattabaugh to make date & time

## 2013-03-23 LAB — CLOSTRIDIUM DIFFICILE BY PCR: Toxigenic C. Difficile by PCR: NOT DETECTED

## 2013-03-23 NOTE — Progress Notes (Signed)
Pt is aware of OV on 3/27 at 8 with RMR. He wanted to see RMR instead of extender.

## 2013-04-01 ENCOUNTER — Encounter: Payer: Self-pay | Admitting: Internal Medicine

## 2013-04-01 NOTE — Progress Notes (Signed)
Pt is aware of OV and appt card was mailed

## 2013-04-04 ENCOUNTER — Other Ambulatory Visit: Payer: Self-pay | Admitting: Family Medicine

## 2013-04-20 ENCOUNTER — Ambulatory Visit (INDEPENDENT_AMBULATORY_CARE_PROVIDER_SITE_OTHER): Payer: Medicare Other | Admitting: Family Medicine

## 2013-04-20 ENCOUNTER — Encounter: Payer: Self-pay | Admitting: Family Medicine

## 2013-04-20 VITALS — BP 132/70 | HR 80 | Temp 98.8°F | Ht 73.0 in | Wt 275.0 lb

## 2013-04-20 DIAGNOSIS — I10 Essential (primary) hypertension: Secondary | ICD-10-CM

## 2013-04-20 DIAGNOSIS — J209 Acute bronchitis, unspecified: Secondary | ICD-10-CM

## 2013-04-20 DIAGNOSIS — R042 Hemoptysis: Secondary | ICD-10-CM

## 2013-04-20 DIAGNOSIS — S060X9A Concussion with loss of consciousness of unspecified duration, initial encounter: Secondary | ICD-10-CM

## 2013-04-20 LAB — POCT HEMOGLOBIN: Hemoglobin: 15.7 g/dL (ref 14.1–18.1)

## 2013-04-20 MED ORDER — CEFPROZIL 500 MG PO TABS: 500.0000 mg | ORAL_TABLET | Freq: Two times a day (BID) | ORAL | Status: DC

## 2013-04-20 MED ORDER — AMLODIPINE BESYLATE 5 MG PO TABS: 10.0000 mg | ORAL_TABLET | Freq: Every day | ORAL | Status: DC

## 2013-04-20 NOTE — Patient Instructions (Signed)
Amlodipine 10 mg will go to 5 mg one daily  Recheck here in 3 to 4 weeks  We will do the xray and scan today
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
## 2013-04-22 ENCOUNTER — Ambulatory Visit (HOSPITAL_COMMUNITY)
Admission: RE | Admit: 2013-04-22 | Discharge: 2013-04-22 | Disposition: A | Discharge status: Home / Self Care | Payer: Medicare Other | Source: Ambulatory Visit | Attending: Family Medicine | Admitting: Family Medicine

## 2013-04-22 DIAGNOSIS — G319 Degenerative disease of nervous system, unspecified: Secondary | ICD-10-CM | POA: Insufficient documentation

## 2013-04-22 DIAGNOSIS — J4489 Other specified chronic obstructive pulmonary disease: Secondary | ICD-10-CM | POA: Insufficient documentation

## 2013-04-22 DIAGNOSIS — J449 Chronic obstructive pulmonary disease, unspecified: Secondary | ICD-10-CM | POA: Insufficient documentation

## 2013-04-22 DIAGNOSIS — W1809XA Striking against other object with subsequent fall, initial encounter: Secondary | ICD-10-CM | POA: Insufficient documentation

## 2013-04-22 DIAGNOSIS — I10 Essential (primary) hypertension: Secondary | ICD-10-CM | POA: Insufficient documentation

## 2013-04-22 DIAGNOSIS — IMO0002 Reserved for concepts with insufficient information to code with codable children: Secondary | ICD-10-CM | POA: Insufficient documentation

## 2013-04-22 DIAGNOSIS — S060X9A Concussion with loss of consciousness of unspecified duration, initial encounter: Secondary | ICD-10-CM

## 2013-04-22 DIAGNOSIS — R404 Transient alteration of awareness: Secondary | ICD-10-CM | POA: Insufficient documentation

## 2013-04-22 DIAGNOSIS — R042 Hemoptysis: Secondary | ICD-10-CM

## 2013-04-22 DIAGNOSIS — I6789 Other cerebrovascular disease: Secondary | ICD-10-CM | POA: Insufficient documentation

## 2013-04-23 ENCOUNTER — Encounter: Payer: Self-pay | Admitting: *Deleted

## 2013-04-30 ENCOUNTER — Encounter: Payer: Self-pay | Admitting: Gastroenterology

## 2013-04-30 ENCOUNTER — Ambulatory Visit (INDEPENDENT_AMBULATORY_CARE_PROVIDER_SITE_OTHER): Payer: Medicare Other | Admitting: Gastroenterology

## 2013-04-30 VITALS — BP 134/65 | HR 66 | Temp 98.2°F | Ht 72.0 in | Wt 277.0 lb

## 2013-04-30 DIAGNOSIS — K5150 Left sided colitis without complications: Secondary | ICD-10-CM

## 2013-04-30 MED ORDER — BALSALAZIDE DISODIUM 750 MG PO CAPS: 2250.0000 mg | ORAL_CAPSULE | Freq: Three times a day (TID) | ORAL | Status: DC

## 2013-04-30 NOTE — Progress Notes (Signed)
      Primary Care Physician: Sallee Lange, MD  Primary Gastroenterologist:  Garfield Cornea, MD   Chief Complaint  Patient presents with  . Follow-up    HPI: [name] is a 73 y.o. male here for followup. He was last seen in February for followup of left-sided proctocolitis. After his colonoscopy back in October his therapy consisted of Lialda.4.8 g daily, cortenemas for 30 days, finish up uceris. He was shown to have active left sided disease. Dr. Gala Romney felt patient was noncompliant with these recommendations at time of the last OV, he was switched to Colazal 2.25 grams TID. Started back on Uceris 53m daily.   BM much better. 2-3 stools per day. Imodium 2 every other day. Stools formed. No blood in stools. Less fecal urgency. No abdominal pain. States the colazal is a miracle drug and only costs him $20. He thinks he didn't take the uceris because it was too expensive.    Current Outpatient Prescriptions  Medication Sig Dispense Refill  . amLODipine (NORVASC) 5 MG tablet Take 2 tablets (10 mg total) by mouth daily.  30 tablet  5  . aspirin 81 MG tablet Take 162 mg by mouth daily.        . balsalazide (COLAZAL) 750 MG capsule Take 3 capsules (2,250 mg total) by mouth 3 (three) times daily.  270 capsule  11  . furosemide (LASIX) 40 MG tablet Take 40 mg by mouth daily.      .Marland Kitchenlosartan (COZAAR) 50 MG tablet TAKE 1 TABLET EVERY DAY  90 tablet  0  . metoprolol (LOPRESSOR) 50 MG tablet TAKE 1 TABLET 3 TIMES A DAY  90 tablet  5  . nitroGLYCERIN (NITROSTAT) 0.4 MG SL tablet Place 0.4 mg under the tongue every 5 (five) minutes as needed for chest pain.      .Marland Kitchenoxybutynin (DITROPAN XL) 10 MG 24 hr tablet Take 10 mg by mouth at bedtime.      . pravastatin (PRAVACHOL) 40 MG tablet TAKE 1 TABLET BY MOUTH EVERY DAY  90 tablet  0  . tamsulosin (FLOMAX) 0.4 MG CAPS capsule        No current facility-administered medications for this visit.    Allergies as of 04/30/2013  . (No Known  Allergies)    ROS:  General: Negative for anorexia, weight loss, fever, chills, fatigue, weakness. ENT: Negative for hoarseness, difficulty swallowing , nasal congestion. CV: Negative for chest pain, angina, palpitations, dyspnea on exertion, peripheral edema.  Respiratory: Negative for dyspnea at rest, dyspnea on exertion, cough, sputum, wheezing.  GI: See history of present illness. GU:  Negative for dysuria, hematuria, urinary incontinence, urinary frequency, nocturnal urination.  Endo: Negative for unusual weight change.    Physical Examination:   BP 134/65  Pulse 66  Temp(Src) 98.2 F (36.8 C) (Oral)  Ht 6' (1.829 m)  Wt 277 lb (125.646 kg)  BMI 37.56 kg/m2  General: Well-nourished, well-developed in no acute distress.  Eyes: No icterus. Mouth: Oropharyngeal mucosa moist and pink , no lesions erythema or exudate. Lungs: Clear to auscultation bilaterally.  Heart: Regular rate and rhythm, no murmurs rubs or gallops.  Abdomen: Bowel sounds are normal, nontender, nondistended, no hepatosplenomegaly or masses, no abdominal bruits or hernia , no rebound or guarding.   Extremities: No lower extremity edema. No clubbing or deformities. Neuro: Alert and oriented x 4   Skin: Warm and dry, no jaundice.   Psych: Alert and cooperative, normal mood and affect.

## 2013-04-30 NOTE — Patient Instructions (Addendum)
1. Continue balsalazide three capsules three times daily. 2. Office visit in 4 months.

## 2013-04-30 NOTE — Assessment & Plan Note (Signed)
Doing much better. He is taken Colazal mostly BID, forgets third dose sometimes. Discussed compliance issues with patient. Appears he likely did not start uceris, but I will verify with pharmacy because if he is taking, he will need to stop at 8 weeks. Patient states [facility] management never got in touch with him and he does not seem interested in pursuing this at this time. OV in four months or sooner if needed.

## 2013-05-01 ENCOUNTER — Ambulatory Visit: Payer: Medicare Other | Admitting: Internal Medicine

## 2013-05-04 NOTE — Progress Notes (Signed)
cc'd to pcp 

## 2013-05-11 ENCOUNTER — Ambulatory Visit (INDEPENDENT_AMBULATORY_CARE_PROVIDER_SITE_OTHER): Payer: Medicare Other | Admitting: Family Medicine

## 2013-05-11 ENCOUNTER — Encounter: Payer: Self-pay | Admitting: Family Medicine

## 2013-05-11 VITALS — BP 128/80 | Ht 73.0 in | Wt 272.0 lb

## 2013-05-11 DIAGNOSIS — J309 Allergic rhinitis, unspecified: Secondary | ICD-10-CM

## 2013-05-11 DIAGNOSIS — I10 Essential (primary) hypertension: Secondary | ICD-10-CM

## 2013-05-11 MED ORDER — FLUTICASONE PROPIONATE 50 MCG/ACT NA SUSP: 2.0000 | Freq: Every day | NASAL | Status: DC

## 2013-05-11 MED ORDER — METHYLPREDNISOLONE ACETATE 40 MG/ML IJ SUSP
40.0000 mg | Freq: Once | INTRAMUSCULAR | Status: AC
  Administered 2013-05-11: 40 mg via INTRAMUSCULAR

## 2013-05-11 MED ORDER — PRAVASTATIN SODIUM 40 MG PO TABS: ORAL_TABLET | ORAL | Status: DC

## 2013-05-11 MED ORDER — LOSARTAN POTASSIUM 50 MG PO TABS: ORAL_TABLET | ORAL | Status: DC

## 2013-05-11 NOTE — Progress Notes (Signed)
   Subjective:    Patient ID: [name], male    DOB: 11-16-1940, 73 y.o.   MRN: [id number]  HPI Patient is here today for a f/u from 3/16 visit d/t a fall. The patient has not had any more falls. Denies headaches. Denies nausea or vomiting He said he is no longer coughing up blood. He states not coughing up any blood or discolored phlegm. He is over his bronchitis. He feels better now, however he is now dealing with allergy symptoms. His head is congested.  He relates mainly allergy symptoms sneezing nasal congestion feeling pressure in the head   Review of Systems Denies chest pain vomiting diarrhea    Objective:   Physical Exam Lungs clear heart regular abdomen soft extremities no edema eardrums normal throat normal       Assessment & Plan:  Allergic rhinitis severe-allergy medicine discuss along with Flonase. Also prednisone Depo-Medrol shot today followup if progressive trouble otherwise recheck 4 months

## 2013-06-05 ENCOUNTER — Other Ambulatory Visit: Payer: Self-pay | Admitting: Family Medicine

## 2013-06-10 NOTE — Progress Notes (Signed)
Received records from the pharmacy. He did not have Uceris filled. RX was placed on hold.

## 2013-09-03 ENCOUNTER — Encounter: Payer: Self-pay | Admitting: Internal Medicine

## 2013-09-10 ENCOUNTER — Ambulatory Visit: Payer: Medicare Other | Admitting: Family Medicine

## 2013-09-30 ENCOUNTER — Other Ambulatory Visit: Payer: Self-pay | Admitting: Family Medicine

## 2013-10-21 ENCOUNTER — Emergency Department (HOSPITAL_COMMUNITY)
Admission: EM | Admit: 2013-10-21 | Discharge: 2013-10-21 | Disposition: A | Discharge status: Home / Self Care | Payer: Medicare Other | Attending: Emergency Medicine | Admitting: Emergency Medicine

## 2013-10-21 ENCOUNTER — Encounter (HOSPITAL_COMMUNITY): Payer: Self-pay | Admitting: Emergency Medicine

## 2013-10-21 ENCOUNTER — Emergency Department (HOSPITAL_COMMUNITY)
Admission: EM | Admit: 2013-10-21 | Discharge: 2013-10-21 | Disposition: A | Discharge status: Home / Self Care | Payer: No Typology Code available for payment source | Attending: Emergency Medicine | Admitting: Emergency Medicine

## 2013-10-21 ENCOUNTER — Emergency Department (HOSPITAL_COMMUNITY): Payer: Medicare Other

## 2013-10-21 ENCOUNTER — Emergency Department (HOSPITAL_COMMUNITY): Payer: No Typology Code available for payment source

## 2013-10-21 DIAGNOSIS — I2510 Atherosclerotic heart disease of native coronary artery without angina pectoris: Secondary | ICD-10-CM | POA: Insufficient documentation

## 2013-10-21 DIAGNOSIS — Z79899 Other long term (current) drug therapy: Secondary | ICD-10-CM | POA: Insufficient documentation

## 2013-10-21 DIAGNOSIS — Y92410 Unspecified street and highway as the place of occurrence of the external cause: Secondary | ICD-10-CM | POA: Insufficient documentation

## 2013-10-21 DIAGNOSIS — Z8719 Personal history of other diseases of the digestive system: Secondary | ICD-10-CM | POA: Diagnosis not present

## 2013-10-21 DIAGNOSIS — Z87442 Personal history of urinary calculi: Secondary | ICD-10-CM | POA: Insufficient documentation

## 2013-10-21 DIAGNOSIS — J4489 Other specified chronic obstructive pulmonary disease: Secondary | ICD-10-CM | POA: Insufficient documentation

## 2013-10-21 DIAGNOSIS — Y9389 Activity, other specified: Secondary | ICD-10-CM | POA: Insufficient documentation

## 2013-10-21 DIAGNOSIS — J449 Chronic obstructive pulmonary disease, unspecified: Secondary | ICD-10-CM | POA: Insufficient documentation

## 2013-10-21 DIAGNOSIS — S20219A Contusion of unspecified front wall of thorax, initial encounter: Secondary | ICD-10-CM | POA: Diagnosis not present

## 2013-10-21 DIAGNOSIS — S139XXA Sprain of joints and ligaments of unspecified parts of neck, initial encounter: Secondary | ICD-10-CM | POA: Diagnosis not present

## 2013-10-21 DIAGNOSIS — Z8669 Personal history of other diseases of the nervous system and sense organs: Secondary | ICD-10-CM | POA: Insufficient documentation

## 2013-10-21 DIAGNOSIS — S199XXA Unspecified injury of neck, initial encounter: Secondary | ICD-10-CM | POA: Insufficient documentation

## 2013-10-21 DIAGNOSIS — Y939 Activity, unspecified: Secondary | ICD-10-CM | POA: Insufficient documentation

## 2013-10-21 DIAGNOSIS — I10 Essential (primary) hypertension: Secondary | ICD-10-CM | POA: Diagnosis not present

## 2013-10-21 DIAGNOSIS — Z87891 Personal history of nicotine dependence: Secondary | ICD-10-CM | POA: Insufficient documentation

## 2013-10-21 DIAGNOSIS — Y929 Unspecified place or not applicable: Secondary | ICD-10-CM | POA: Insufficient documentation

## 2013-10-21 DIAGNOSIS — S20211S Contusion of right front wall of thorax, sequela: Secondary | ICD-10-CM

## 2013-10-21 DIAGNOSIS — S21109A Unspecified open wound of unspecified front wall of thorax without penetration into thoracic cavity, initial encounter: Secondary | ICD-10-CM | POA: Insufficient documentation

## 2013-10-21 DIAGNOSIS — Z8619 Personal history of other infectious and parasitic diseases: Secondary | ICD-10-CM | POA: Insufficient documentation

## 2013-10-21 DIAGNOSIS — Z9861 Coronary angioplasty status: Secondary | ICD-10-CM | POA: Diagnosis not present

## 2013-10-21 DIAGNOSIS — Z87448 Personal history of other diseases of urinary system: Secondary | ICD-10-CM | POA: Insufficient documentation

## 2013-10-21 DIAGNOSIS — S161XXA Strain of muscle, fascia and tendon at neck level, initial encounter: Secondary | ICD-10-CM

## 2013-10-21 DIAGNOSIS — K219 Gastro-esophageal reflux disease without esophagitis: Secondary | ICD-10-CM | POA: Insufficient documentation

## 2013-10-21 DIAGNOSIS — R0789 Other chest pain: Secondary | ICD-10-CM

## 2013-10-21 DIAGNOSIS — N400 Enlarged prostate without lower urinary tract symptoms: Secondary | ICD-10-CM | POA: Diagnosis not present

## 2013-10-21 DIAGNOSIS — Z9889 Other specified postprocedural states: Secondary | ICD-10-CM | POA: Insufficient documentation

## 2013-10-21 DIAGNOSIS — M542 Cervicalgia: Secondary | ICD-10-CM | POA: Diagnosis not present

## 2013-10-21 DIAGNOSIS — IMO0002 Reserved for concepts with insufficient information to code with codable children: Secondary | ICD-10-CM | POA: Diagnosis not present

## 2013-10-21 DIAGNOSIS — S298XXA Other specified injuries of thorax, initial encounter: Secondary | ICD-10-CM | POA: Diagnosis not present

## 2013-10-21 DIAGNOSIS — Z7982 Long term (current) use of aspirin: Secondary | ICD-10-CM | POA: Insufficient documentation

## 2013-10-21 DIAGNOSIS — W268XXA Contact with other sharp object(s), not elsewhere classified, initial encounter: Secondary | ICD-10-CM | POA: Insufficient documentation

## 2013-10-21 DIAGNOSIS — S0993XA Unspecified injury of face, initial encounter: Secondary | ICD-10-CM | POA: Diagnosis not present

## 2013-10-21 DIAGNOSIS — R079 Chest pain, unspecified: Secondary | ICD-10-CM | POA: Diagnosis not present

## 2013-10-21 DIAGNOSIS — E785 Hyperlipidemia, unspecified: Secondary | ICD-10-CM | POA: Insufficient documentation

## 2013-10-21 DIAGNOSIS — M1990 Unspecified osteoarthritis, unspecified site: Secondary | ICD-10-CM | POA: Insufficient documentation

## 2013-10-21 LAB — BASIC METABOLIC PANEL
Anion gap: 10 (ref 5–15)
BUN: 12 mg/dL (ref 6–23)
CO2: 28 mEq/L (ref 19–32)
Calcium: 9 mg/dL (ref 8.4–10.5)
Chloride: 107 mEq/L (ref 96–112)
Creatinine, Ser: 0.96 mg/dL (ref 0.50–1.35)
GFR calc Af Amer: >90 mL/min (ref >90)
GFR calc non Af Amer: 79 mL/min — ABNORMAL LOW (ref >90)
Glucose, Bld: 104 mg/dL — ABNORMAL HIGH (ref 70–99)
Potassium: 4.6 mEq/L (ref 3.7–5.3)
Sodium: 145 mEq/L (ref 137–147)

## 2013-10-21 LAB — CBC WITH DIFFERENTIAL/PLATELET
Basophils Absolute: 0 10*3/uL (ref 0.0–0.1)
Basophils Relative: 0 % (ref 0–1)
Eosinophils Absolute: 0.2 10*3/uL (ref 0.0–0.7)
Eosinophils Relative: 2 % (ref 0–5)
HCT: 42.4 % (ref 39.0–52.0)
Hemoglobin: 13.8 g/dL (ref 13.0–17.0)
Lymphocytes Relative: 28 % (ref 12–46)
Lymphs Abs: 2.5 10*3/uL (ref 0.7–4.0)
MCH: 29.7 pg (ref 26.0–34.0)
MCHC: 32.5 g/dL (ref 30.0–36.0)
MCV: 91.4 fL (ref 78.0–100.0)
Monocytes Absolute: 0.9 10*3/uL (ref 0.1–1.0)
Monocytes Relative: 10 % (ref 3–12)
Neutro Abs: 5.4 10*3/uL (ref 1.7–7.7)
Neutrophils Relative %: 60 % (ref 43–77)
Platelets: 199 10*3/uL (ref 150–400)
RBC: 4.64 MIL/uL (ref 4.22–5.81)
RDW: 13.6 % (ref 11.5–15.5)
WBC: 8.9 10*3/uL (ref 4.0–10.5)

## 2013-10-21 MED ORDER — HYDROCODONE-ACETAMINOPHEN 5-325 MG PO TABS: 1.0000 | ORAL_TABLET | Freq: Four times a day (QID) | ORAL | Status: DC | PRN

## 2013-10-21 MED ORDER — IOHEXOL 300 MG/ML  SOLN
80.0000 mL | Freq: Once | INTRAMUSCULAR | Status: AC | PRN
  Administered 2013-10-21: 80 mL via INTRAVENOUS

## 2013-10-21 NOTE — ED Notes (Addendum)
Laceration to right upper chest with hematoma noted.  Dr Dulcy Fanny notified of increasing hematoma to right chest.  Marking made to right chest with skin marker.  C/o pain when touched.

## 2013-10-21 NOTE — ED Notes (Signed)
Pt states that his chest feels sore, is worse with movement and deep breath but denies pain is worse with palpation.  EKG completed

## 2013-10-21 NOTE — ED Notes (Signed)
MD at bedside. 

## 2013-10-21 NOTE — ED Provider Notes (Signed)
CSN: [id number]     Arrival date & time 10/21/13  1155 History  This chart was scribed for Maudry Diego, MD by Rayfield Citizen, ED Scribe. This patient was seen in room APA03/APA03 and the patient's care was started at 2:14 PM.    Chief Complaint  Patient presents with  . Body Laceration   Patient is a 73 y.o. male presenting with fall. The history is provided by the patient (the pt fell when carrying a camode.  the pt complains of chest pain from trauma). No language interpreter was used.  Fall This is a new problem. The current episode started 3 to 5 hours ago. The problem occurs constantly. The problem has not changed since onset.Associated symptoms include chest pain. Pertinent negatives include no abdominal pain and no headaches. Exacerbated by: movement. Nothing relieves the symptoms. He has tried nothing for the symptoms.    HPI Comments: [name] is a 73 y.o. male who presents to the Emergency Department complaining of chest pain   Past Medical History  Diagnosis Date  . Hypertension   . DJD (degenerative joint disease)   . GERD (gastroesophageal reflux disease)   . Hyperlipidemia   . BPH (benign prostatic hyperplasia)   . Obstructive sleep apnea   . Diverticulosis   . Gastric ulcer 04/17/10    Three 74m gastric ulcers, H.pylori serologies were negative  . Clostridium difficile colitis 04/2005  . Idiopathic chronic inflammatory bowel disease 05/18/2010    left-sided UC  . S/P endoscopy 07/24/10    retained gastric contents, benign bx  . CAD (coronary artery disease) 01/1995  . Reflux 02/1995  . Colitis 2011  . Kidney stone   . COPD (chronic obstructive pulmonary disease)   . Asthma    Past Surgical History  Procedure Laterality Date  . Colonoscopy  04/2005    granularity and friability erosions from rectum to 40cm. Bx infection vs IBD. C. Diff positive at the time.   . Knee surgery  two  . Shoulder surgery  two  . Foot surgery  two  . Tonsillectomy    .  Cervical spine surgery      C4-5  . Colonoscopy  05/2010    Rourk: left-sided UC, bx with no dysplasia, shallow diverticula  . Coronary stent placement  01/1995  . Esophagogastroduodenoscopy  07/24/2010    RWUJ:WJXBJYesophagus  . Colonoscopy [name] 11/07/2012    RNWG:NFAO-ZHYQMproctocolitis status post segmental biopsy/Sigmoid colon polyps removed as described above. Procedure compromisd by technical difficulties. bx: Inflammation limited to sigmoid and rectum on pathology.  . Cardiovascular stress test  07/21/2009    No scintigraphic evidence of inducible myocardial ischemia  . Transthoracic echocardiogram  03/23/2009    EF 60-65%, normal LV systolic function   Family History  Problem Relation Age of Onset  . Lung cancer Mother   . Cancer Mother     breast  . Diabetes Mother   . Stroke Father   . Hypertension Father   . Colon cancer Neg Hx    History  Substance Use Topics  . Smoking status: Former SResearch scientist (life sciences) . Smokeless tobacco: Former [employer] developer   Quit date: 01/15/1970  . Alcohol Use: No    Review of Systems  Constitutional: Negative for appetite change and fatigue.  HENT: Negative for congestion, ear discharge and sinus pressure.   Eyes: Negative for discharge.  Respiratory: Negative for cough.   Cardiovascular: Positive for chest pain.  Gastrointestinal: Negative for abdominal pain and diarrhea.  Genitourinary: Negative for frequency and hematuria.  Musculoskeletal: Negative for back pain.  Skin: Negative for rash.  Neurological: Negative for seizures and headaches.  Psychiatric/Behavioral: Negative for hallucinations.     Allergies  Review of patient's allergies indicates no known allergies.  Home Medications   Prior to Admission medications   Medication Sig Start Date End Date Taking? Authorizing Provider  amLODipine (NORVASC) 5 MG tablet Take 2 tablets (10 mg total) by mouth daily. 04/20/13  Yes [name], MD  aspirin 81 MG tablet Take 162 mg by mouth daily.     Yes  Historical Provider, MD  balsalazide (COLAZAL) 750 MG capsule Take 750 mg by mouth 3 (three) times daily.   Yes Historical Provider, MD  doxazosin (CARDURA) 8 MG tablet Take 4 mg by mouth daily.   Yes Historical Provider, MD  metoprolol (LOPRESSOR) 50 MG tablet Take 150 mg by mouth at bedtime.   Yes Historical Provider, MD  nitroGLYCERIN (NITROSTAT) 0.4 MG SL tablet Place 0.4 mg under the tongue every 5 (five) minutes as needed for chest pain.   Yes Historical Provider, MD  oxybutynin (DITROPAN XL) 10 MG 24 hr tablet Take 10 mg by mouth at bedtime.   Yes Historical Provider, MD  pravastatin (PRAVACHOL) 40 MG tablet Take 40 mg by mouth daily.   Yes Historical Provider, MD  valsartan (DIOVAN) 160 MG tablet Take 160 mg by mouth daily.   Yes Historical Provider, MD   BP 144/72  Pulse 74  Temp(Src) 98.1 F (36.7 C) (Oral)  Resp 20  Ht 6' 2"  (1.88 m)  Wt 271 lb (122.925 kg)  BMI 34.78 kg/m2  SpO2 97% Physical Exam  Constitutional: He is oriented to person, place, and time. He appears well-developed.  HENT:  Head: Normocephalic.  Eyes: Conjunctivae and EOM are normal. No scleral icterus.  Neck: Neck supple. No thyromegaly present.  Cardiovascular: Normal rate and regular rhythm.  Exam reveals no gallop and no friction rub.   No murmur heard. Pulmonary/Chest: No stridor. He has no wheezes. He has no rales. He exhibits tenderness.  Abdominal: He exhibits no distension. There is no tenderness. There is no rebound.  Musculoskeletal: Normal range of motion. He exhibits no edema.  Lymphadenopathy:    He has no cervical adenopathy.  Neurological: He is oriented to person, place, and time. He exhibits normal muscle tone. Coordination normal.  Skin: No rash noted.  abrasion and contusion to chest.  Abrasion to hands  Psychiatric: He has a normal mood and affect. His behavior is normal.    ED Course  Procedures   DIAGNOSTIC STUDIES: Oxygen Saturation is 97% on RA, normal by my interpretation.     COORDINATION OF CARE: 2:14 PM Discussed treatment plan with pt at bedside and pt agreed to plan.          Labs Review Labs Reviewed  CBC WITH DIFFERENTIAL  BASIC METABOLIC PANEL    Imaging Review Dg Chest 2 View  10/21/2013   CLINICAL DATA:  Pain post trauma  EXAM: CHEST  2 VIEW  COMPARISON:  April 22, 2013  FINDINGS: There is underlying emphysematous change. There is no edema or consolidation. The heart is upper normal in size with pulmonary vascularity reflecting the underlying emphysema. No adenopathy. No pneumothorax. There is degenerative change in the thoracic spine.  IMPRESSION: Underlying emphysematous change.  No edema or consolidation.   Electronically Signed   By: Lowella Grip M.D.   On: 10/21/2013 12:56     EKG  Interpretation None      MDM   Final diagnoses:  None    The chart was scribed for me under my direct supervision.  I personally performed the history, physical, and medical decision making and all procedures in the evaluation of this patient.Maudry Diego, MD 10/21/13 [phone]
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
## 2013-10-21 NOTE — Discharge Instructions (Signed)
Follow up with your md next week for recheck

## 2013-10-21 NOTE — Discharge Instructions (Signed)
Take her pain medicine as directed. Return for development of abdominal pain or persistent vomiting. Expect to be sore the next few days. Followup with your doctor as needed.
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
## 2013-10-28 ENCOUNTER — Encounter: Payer: Self-pay | Admitting: Family Medicine

## 2013-10-28 ENCOUNTER — Ambulatory Visit (INDEPENDENT_AMBULATORY_CARE_PROVIDER_SITE_OTHER): Payer: Medicare Other | Admitting: Family Medicine

## 2013-10-28 VITALS — BP 138/80 | Temp 98.0°F | Ht 73.0 in | Wt 284.0 lb

## 2013-10-28 DIAGNOSIS — R7301 Impaired fasting glucose: Secondary | ICD-10-CM

## 2013-10-28 DIAGNOSIS — E782 Mixed hyperlipidemia: Secondary | ICD-10-CM

## 2013-10-28 DIAGNOSIS — J441 Chronic obstructive pulmonary disease with (acute) exacerbation: Secondary | ICD-10-CM

## 2013-10-28 MED ORDER — ALBUTEROL SULFATE HFA 108 (90 BASE) MCG/ACT IN AERS: 2.0000 | INHALATION_SPRAY | Freq: Four times a day (QID) | RESPIRATORY_TRACT | Status: DC | PRN

## 2013-10-28 MED ORDER — SULFAMETHOXAZOLE-TMP DS 800-160 MG PO TABS: 1.0000 | ORAL_TABLET | Freq: Two times a day (BID) | ORAL | Status: DC

## 2013-10-28 NOTE — Progress Notes (Signed)
   Subjective:    Patient ID: [name], male    DOB: 12-25-1940, 73 y.o.   MRN: [id number]  Cough This is a new problem. The current episode started in the past 7 days. The problem has been unchanged. The problem occurs constantly. The cough is productive of sputum. Associated symptoms include rhinorrhea, shortness of breath and wheezing. Pertinent negatives include no chest pain, ear pain or fever. Nothing aggravates the symptoms. He has tried OTC cough suppressant for the symptoms. The treatment provided no relief.   Patient states that he has no other concerns at this time.    Review of Systems  Constitutional: Negative for fever and activity change.  HENT: Positive for congestion and rhinorrhea. Negative for ear pain.   Eyes: Negative for discharge.  Respiratory: Positive for cough, shortness of breath and wheezing.   Cardiovascular: Negative for chest pain.       Objective:   Physical Exam  Nursing note and vitals reviewed. Constitutional: He appears well-developed.  HENT:  Head: Normocephalic.  Mouth/Throat: Oropharynx is clear and moist. No oropharyngeal exudate.  Neck: Normal range of motion.  Cardiovascular: Normal rate, regular rhythm and normal heart sounds.   No murmur heard. Pulmonary/Chest: Effort normal. He has wheezes. He has no rales.  Lymphadenopathy:    He has no cervical adenopathy.  Neurological: He exhibits normal muscle tone.  Skin: Skin is warm and dry.   faint wheezes were noted        Assessment & Plan:  The upper respiratory illness revealed this was secondary bronchitis and reactive airway. COPD exacerbation. Patient also needs lipid profile glucose and a followup office visit. Warning signs discussed. Albuterol as directed I don't feel patient needs steroids at this time. Antibiotics prescribed.

## 2013-11-12 DIAGNOSIS — R7301 Impaired fasting glucose: Secondary | ICD-10-CM | POA: Diagnosis not present

## 2013-11-12 DIAGNOSIS — E782 Mixed hyperlipidemia: Secondary | ICD-10-CM | POA: Diagnosis not present

## 2013-11-12 LAB — LIPID PANEL
Cholesterol: 138 mg/dL (ref 0–200)
HDL: 44 mg/dL (ref >39)
LDL Cholesterol: 70 mg/dL (ref 0–99)
Total CHOL/HDL Ratio: 3.1 Ratio
Triglycerides: 118 mg/dL (ref <150)
VLDL: 24 mg/dL (ref 0–40)

## 2013-11-12 LAB — HEMOGLOBIN A1C
Hgb A1c MFr Bld: 5.6 % (ref <5.7)
Mean Plasma Glucose: 114 mg/dL (ref <117)

## 2013-11-12 LAB — GLUCOSE, RANDOM: Glucose, Bld: 99 mg/dL (ref 70–99)

## 2013-12-03 ENCOUNTER — Ambulatory Visit (INDEPENDENT_AMBULATORY_CARE_PROVIDER_SITE_OTHER): Payer: Medicare Other | Admitting: Family Medicine

## 2013-12-03 ENCOUNTER — Encounter: Payer: Self-pay | Admitting: Family Medicine

## 2013-12-03 VITALS — BP 130/82 | Ht 73.0 in | Wt 284.0 lb

## 2013-12-03 DIAGNOSIS — E785 Hyperlipidemia, unspecified: Secondary | ICD-10-CM

## 2013-12-03 DIAGNOSIS — Z23 Encounter for immunization: Secondary | ICD-10-CM

## 2013-12-03 DIAGNOSIS — R7303 Prediabetes: Secondary | ICD-10-CM

## 2013-12-03 DIAGNOSIS — I10 Essential (primary) hypertension: Secondary | ICD-10-CM

## 2013-12-03 DIAGNOSIS — K5190 Ulcerative colitis, unspecified, without complications: Secondary | ICD-10-CM

## 2013-12-03 DIAGNOSIS — R7309 Other abnormal glucose: Secondary | ICD-10-CM | POA: Diagnosis not present

## 2013-12-03 MED ORDER — AMOXICILLIN 500 MG PO TABS: 500.0000 mg | ORAL_TABLET | Freq: Three times a day (TID) | ORAL | Status: DC

## 2013-12-03 MED ORDER — DIPHENOXYLATE-ATROPINE 2.5-0.025 MG PO TABS: 1.0000 | ORAL_TABLET | Freq: Four times a day (QID) | ORAL | Status: DC | PRN

## 2013-12-03 NOTE — Progress Notes (Signed)
   Subjective:    Patient ID: [name], male    DOB: 02-Oct-1940, 73 y.o.   MRN: [id number]  HPI Patient arrives to follow up on blood work. Patient would like appt with dr Tana Felts control bowels anymore-feeling weak and states the wants to GI to have it fixed. Patient finding significant problems with ulcerative colitis also having some upper respiratory illness congestion and coughing.  Review of Systems    he relates diarrhea abdominal cramps denies vomiting denies sweats chills Objective:   Physical Exam Lungs clear heart regular abdomen soft obese extremities no edema eardrums normal mild sinus tenderness vital signs stable       Assessment & Plan:  Right upper chest deep contusion this will gradually resolve no need for x-rays  Ulcerative colitis-he is extremely frustrated by this causes some major problems for him and is affecting his quality of life he would like a second opinion. I think it is very reasonable for [place] to pursue this. Gastroenterology referral recommended. Patient was counseled that ulcerative colitis can be controlled at best. It cannot be cured. May use Lomotil when necessary  URI viral syndrome with secondary sinusitis amoxicillin 10 days follow-up of problems  History hyperlipidemia lipid profile looks good History prediabetes A1c looks good Follow-up 6 months

## 2013-12-17 ENCOUNTER — Ambulatory Visit (INDEPENDENT_AMBULATORY_CARE_PROVIDER_SITE_OTHER): Payer: Medicare Other | Admitting: Family Medicine

## 2013-12-17 ENCOUNTER — Encounter: Payer: Self-pay | Admitting: Family Medicine

## 2013-12-17 VITALS — Temp 98.2°F | Ht 73.0 in | Wt 277.0 lb

## 2013-12-17 DIAGNOSIS — M316 Other giant cell arteritis: Secondary | ICD-10-CM

## 2013-12-17 DIAGNOSIS — W57XXXA Bitten or stung by nonvenomous insect and other nonvenomous arthropods, initial encounter: Secondary | ICD-10-CM

## 2013-12-17 DIAGNOSIS — T148 Other injury of unspecified body region: Secondary | ICD-10-CM

## 2013-12-17 MED ORDER — DOXYCYCLINE HYCLATE 100 MG PO CAPS: 100.0000 mg | ORAL_CAPSULE | Freq: Two times a day (BID) | ORAL | Status: DC

## 2013-12-17 NOTE — Progress Notes (Signed)
   Subjective:    Patient ID: [name], male    DOB: 06/03/1940, 73 y.o.   MRN: [id number]  HPI Patient is here today because of a tick bite. Patient noticed it last night. Has a reddened, swollen area on his thigh where the tick bit him at. Patient also complains of headaches.   Patient also complains of sharp pains on left side of his head he was worked in today for the tick bite but he also complaints of sharp pains in his had these have recently started over the past couple weeks Review of Systems He denies fever chills sweats she does relate some soreness where he had the tick bite in his left groin    Objective:   Physical Exam Lungs are clear hearts regular left temple slightly tender left posterior occiput slightly tender area on the left lower leg appears to be slightly infected       Assessment & Plan:  Tick bite with localized infection doxycycline twice a day 10 days  Headaches with sharp pains in the side of his head check sedimentation rate rule out possibility of temporal arteritis  Possible occipital neuralgia as well await the above tests

## 2013-12-18 LAB — CBC WITH DIFFERENTIAL/PLATELET
Basophils Absolute: 0.1 10*3/uL (ref 0.0–0.1)
Basophils Relative: 1 % (ref 0–1)
Eosinophils Absolute: 0.2 10*3/uL (ref 0.0–0.7)
Eosinophils Relative: 2 % (ref 0–5)
HCT: 42.9 % (ref 39.0–52.0)
Hemoglobin: 14.5 g/dL (ref 13.0–17.0)
Lymphocytes Relative: 32 % (ref 12–46)
Lymphs Abs: 2.8 10*3/uL (ref 0.7–4.0)
MCH: 29.3 pg (ref 26.0–34.0)
MCHC: 33.8 g/dL (ref 30.0–36.0)
MCV: 86.7 fL (ref 78.0–100.0)
Monocytes Absolute: 0.6 10*3/uL (ref 0.1–1.0)
Monocytes Relative: 7 % (ref 3–12)
Neutro Abs: 5 10*3/uL (ref 1.7–7.7)
Neutrophils Relative %: 58 % (ref 43–77)
Platelets: 274 10*3/uL (ref 150–400)
RBC: 4.95 MIL/uL (ref 4.22–5.81)
RDW: 13.6 % (ref 11.5–15.5)
WBC: 8.7 10*3/uL (ref 4.0–10.5)

## 2013-12-18 LAB — SEDIMENTATION RATE: Sed Rate: 4 mm/hr (ref 0–16)

## 2013-12-28 ENCOUNTER — Other Ambulatory Visit: Payer: Self-pay | Admitting: Family Medicine

## 2014-01-07 ENCOUNTER — Encounter: Payer: Self-pay | Admitting: Cardiovascular Disease

## 2014-01-07 ENCOUNTER — Ambulatory Visit (INDEPENDENT_AMBULATORY_CARE_PROVIDER_SITE_OTHER): Payer: Medicare Other | Admitting: Cardiovascular Disease

## 2014-01-07 VITALS — BP 148/64 | HR 62 | Ht 73.0 in | Wt 275.3 lb

## 2014-01-07 DIAGNOSIS — I2510 Atherosclerotic heart disease of native coronary artery without angina pectoris: Secondary | ICD-10-CM

## 2014-01-07 DIAGNOSIS — E785 Hyperlipidemia, unspecified: Secondary | ICD-10-CM

## 2014-01-07 DIAGNOSIS — I10 Essential (primary) hypertension: Secondary | ICD-10-CM | POA: Diagnosis not present

## 2014-01-07 MED ORDER — PRAVASTATIN SODIUM 40 MG PO TABS: 40.0000 mg | ORAL_TABLET | Freq: Every day | ORAL | Status: DC

## 2014-01-07 NOTE — Assessment & Plan Note (Signed)
History of hyperlipidemia on pravastatin 40 mg a day. Recent blood work performed 11/12/13 revealed a total cholesterol 138, LDL 70 and HDL of 44

## 2014-01-07 NOTE — Assessment & Plan Note (Signed)
History of hypertension with blood pressure measurements at 148/64. He is on amlodipine 5 mg, doxazosin 8 mg, metoprolol 50 mg and valsartan 160 mg. Continue current medications at current dosing

## 2014-01-07 NOTE — Assessment & Plan Note (Signed)
History of CAD status post LAD PCI and stenting by myself 01/28/95 in the setting of an acute anterior wall myocardial infarction. Echo performed in 2007 revealed normal LV function and Myoview performed 07/21/09 was normal as well. The patient denies chest pain or shortness of breath.

## 2014-01-07 NOTE — Patient Instructions (Signed)
Dr Sallyanne Kuster recommends that you schedule a follow-up appointment in 1 year. You will receive a reminder letter in the mail two months in advance. If you don't receive a letter, please call our office to schedule the follow-up appointment.

## 2014-01-07 NOTE — Progress Notes (Signed)
01/07/2014 [name]   09-20-1940  [id number]  Primary Physician Sallee Lange, MD Primary Cardiologist: Lorretta Harp MD Renae Gloss   HPI:  The patient is a 73 year old, moderately overweight, widowed Caucasian male, father of 3, grandfather of 73 grandchildren, whom I last saw on 01/09/13. He has a history of CAD, status post PCI and stenting of his LAD by me on January 28, 1995, in the setting of an acute anterior wall myocardial infarction. His other problems include hypertension, hyperlipidemia, and obstructive sleep apnea on CPAP. He denies chest pain or shortness of breath. He had a 2D echocardiogram in 2007 which revealed normal LV function. His last Myoview performed on July 21, 2009, was normal.Dr. Wolfgang Phoenix  follows his lipid profile which most recently performed 11/12/13 revealed a total cholesterol 138, LDL 70 and HDL of 44. He denies chest pain or shortness of breath.    Current Outpatient Prescriptions  Medication Sig Dispense Refill  . amLODipine (NORVASC) 5 MG tablet Take 2 tablets (10 mg total) by mouth daily. 30 tablet 5  . aspirin 81 MG tablet Take 162 mg by mouth daily.      . balsalazide (COLAZAL) 750 MG capsule Take 750 mg by mouth 3 (three) times daily.    Marland Kitchen doxazosin (CARDURA) 8 MG tablet Take 4 mg by mouth daily.    . metoprolol (LOPRESSOR) 50 MG tablet TAKE 1 TABLET BY MOUTH 3 TIMES A DAY 90 tablet 5  . nitroGLYCERIN (NITROSTAT) 0.4 MG SL tablet Place 0.4 mg under the tongue every 5 (five) minutes as needed for chest pain.    Marland Kitchen oxybutynin (DITROPAN XL) 10 MG 24 hr tablet Take 10 mg by mouth at bedtime.    . pravastatin (PRAVACHOL) 40 MG tablet Take 1 tablet (40 mg total) by mouth daily. 90 tablet 3  . valsartan (DIOVAN) 160 MG tablet Take 160 mg by mouth daily.     No current facility-administered medications for this visit.    No Known Allergies  History   Social History  . Marital Status: Widowed    Spouse Name: [name]    Number of  Children: [name]  . Years of Education: [name]   Occupational History  . maintenance     retired   Social History Main Topics  . Smoking status: Former Research scientist (life sciences)  . Smokeless tobacco: Former Systems developer    Quit date: 01/15/1970  . Alcohol Use: No  . Drug Use: No  . Sexual Activity:    Partners: Female    Museum/gallery curator: None     Comment: spouse   Other Topics Concern  . Not on file   Social History Narrative     Review of Systems: General: negative for chills, fever, night sweats or weight changes.  Cardiovascular: negative for chest pain, dyspnea on exertion, edema, orthopnea, palpitations, paroxysmal nocturnal dyspnea or shortness of breath Dermatological: negative for rash Respiratory: negative for cough or wheezing Urologic: negative for hematuria Abdominal: negative for nausea, vomiting, diarrhea, bright red blood per rectum, melena, or hematemesis Neurologic: negative for visual changes, syncope, or dizziness All other systems reviewed and are otherwise negative except as noted above.    Blood pressure 148/64, pulse 62, height 6' 1"  (1.854 m), weight 275 lb 4.8 oz (124.875 kg).  General appearance: alert and no distress Neck: no adenopathy, no carotid bruit, no JVD, supple, symmetrical, trachea midline and thyroid not enlarged, symmetric, no tenderness/mass/nodules Lungs: clear to auscultation bilaterally Heart: regular rate and rhythm, S1,  S2 normal, no murmur, click, rub or gallop Extremities: extremities normal, atraumatic, no cyanosis or edema  EKG normal sinus rhythm at 62 with evidence of LVH with repolarization changes and left axis deviation. I personally reviewed this EKG  ASSESSMENT AND PLAN:   CAD (coronary artery disease), native coronary artery History of CAD status post LAD PCI and stenting by myself 01/28/95 in the setting of an acute anterior wall myocardial infarction. Echo performed in 2007 revealed normal LV function and Myoview performed 07/21/09  was normal as well. The patient denies chest pain or shortness of breath.  Essential hypertension History of hypertension with blood pressure measurements at 148/64. He is on amlodipine 5 mg, doxazosin 8 mg, metoprolol 50 mg and valsartan 160 mg. Continue current medications at current dosing  Hyperlipidemia History of hyperlipidemia on pravastatin 40 mg a day. Recent blood work performed 11/12/13 revealed a total cholesterol 138, LDL 70 and HDL of [place] MD [facility] 01/07/2014 4:19 PM

## 2014-01-13 ENCOUNTER — Encounter: Payer: Self-pay | Admitting: Family Medicine

## 2014-02-05 HISTORY — PX: ANORECTAL MANOMETRY: SHX298

## 2014-02-10 DIAGNOSIS — R159 Full incontinence of feces: Secondary | ICD-10-CM | POA: Insufficient documentation

## 2014-02-24 ENCOUNTER — Other Ambulatory Visit: Payer: Self-pay | Admitting: Family Medicine

## 2014-04-06 HISTORY — PX: COLONOSCOPY: SHX174

## 2014-04-06 HISTORY — PX: ESOPHAGOGASTRODUODENOSCOPY: SHX1529

## 2014-05-31 ENCOUNTER — Other Ambulatory Visit: Payer: Self-pay | Admitting: Family Medicine

## 2014-06-26 ENCOUNTER — Other Ambulatory Visit: Payer: Self-pay | Admitting: Family Medicine

## 2014-07-05 ENCOUNTER — Other Ambulatory Visit: Payer: Self-pay | Admitting: Family Medicine

## 2014-07-06 ENCOUNTER — Other Ambulatory Visit: Payer: Self-pay | Admitting: Family Medicine

## 2014-07-06 NOTE — Telephone Encounter (Signed)
Needs office Visit.

## 2014-07-06 NOTE — Telephone Encounter (Signed)
Needs office visit.

## 2014-07-07 ENCOUNTER — Other Ambulatory Visit: Payer: Self-pay | Admitting: Family Medicine

## 2014-07-07 NOTE — Telephone Encounter (Signed)
Needs office Visit.

## 2014-07-13 ENCOUNTER — Other Ambulatory Visit: Payer: Self-pay | Admitting: Family Medicine

## 2014-07-13 NOTE — Telephone Encounter (Signed)
Needs office visit.

## 2014-07-16 ENCOUNTER — Other Ambulatory Visit: Payer: Self-pay | Admitting: Family Medicine

## 2014-07-19 NOTE — Telephone Encounter (Signed)
Needs office visit.

## 2014-07-22 ENCOUNTER — Other Ambulatory Visit: Payer: Self-pay | Admitting: Family Medicine

## 2014-07-22 NOTE — Telephone Encounter (Signed)
Needs office visit.

## 2014-07-31 ENCOUNTER — Other Ambulatory Visit: Payer: Self-pay | Admitting: Family Medicine

## 2014-08-02 NOTE — Telephone Encounter (Signed)
Needs office visit.

## 2014-08-04 ENCOUNTER — Emergency Department (HOSPITAL_COMMUNITY): Payer: Commercial Managed Care - HMO

## 2014-08-04 ENCOUNTER — Emergency Department (HOSPITAL_COMMUNITY)
Admission: EM | Admit: 2014-08-04 | Discharge: 2014-08-04 | Disposition: A | Discharge status: Home / Self Care | Payer: Commercial Managed Care - HMO | Attending: Emergency Medicine | Admitting: Emergency Medicine

## 2014-08-04 ENCOUNTER — Encounter (HOSPITAL_COMMUNITY): Payer: Self-pay | Admitting: Emergency Medicine

## 2014-08-04 DIAGNOSIS — Z79899 Other long term (current) drug therapy: Secondary | ICD-10-CM | POA: Diagnosis not present

## 2014-08-04 DIAGNOSIS — R109 Unspecified abdominal pain: Secondary | ICD-10-CM | POA: Diagnosis present

## 2014-08-04 DIAGNOSIS — Z8739 Personal history of other diseases of the musculoskeletal system and connective tissue: Secondary | ICD-10-CM | POA: Diagnosis not present

## 2014-08-04 DIAGNOSIS — K5900 Constipation, unspecified: Secondary | ICD-10-CM | POA: Diagnosis not present

## 2014-08-04 DIAGNOSIS — I10 Essential (primary) hypertension: Secondary | ICD-10-CM | POA: Diagnosis not present

## 2014-08-04 DIAGNOSIS — N201 Calculus of ureter: Secondary | ICD-10-CM | POA: Diagnosis not present

## 2014-08-04 DIAGNOSIS — Z8619 Personal history of other infectious and parasitic diseases: Secondary | ICD-10-CM | POA: Diagnosis not present

## 2014-08-04 DIAGNOSIS — I2510 Atherosclerotic heart disease of native coronary artery without angina pectoris: Secondary | ICD-10-CM | POA: Insufficient documentation

## 2014-08-04 DIAGNOSIS — Z87438 Personal history of other diseases of male genital organs: Secondary | ICD-10-CM | POA: Insufficient documentation

## 2014-08-04 DIAGNOSIS — E785 Hyperlipidemia, unspecified: Secondary | ICD-10-CM | POA: Insufficient documentation

## 2014-08-04 DIAGNOSIS — Z8669 Personal history of other diseases of the nervous system and sense organs: Secondary | ICD-10-CM | POA: Insufficient documentation

## 2014-08-04 DIAGNOSIS — Z7982 Long term (current) use of aspirin: Secondary | ICD-10-CM | POA: Insufficient documentation

## 2014-08-04 DIAGNOSIS — J449 Chronic obstructive pulmonary disease, unspecified: Secondary | ICD-10-CM | POA: Diagnosis not present

## 2014-08-04 DIAGNOSIS — Z9861 Coronary angioplasty status: Secondary | ICD-10-CM | POA: Diagnosis not present

## 2014-08-04 DIAGNOSIS — Z9889 Other specified postprocedural states: Secondary | ICD-10-CM | POA: Diagnosis not present

## 2014-08-04 LAB — CBC WITH DIFFERENTIAL/PLATELET
Basophils Absolute: 0 10*3/uL (ref 0.0–0.1)
Basophils Relative: 0 % (ref 0–1)
Eosinophils Absolute: 0 10*3/uL (ref 0.0–0.7)
Eosinophils Relative: 0 % (ref 0–5)
HCT: 44.8 % (ref 39.0–52.0)
Hemoglobin: 15 g/dL (ref 13.0–17.0)
Lymphocytes Relative: 12 % (ref 12–46)
Lymphs Abs: 1.7 10*3/uL (ref 0.7–4.0)
MCH: 29.8 pg (ref 26.0–34.0)
MCHC: 33.5 g/dL (ref 30.0–36.0)
MCV: 89.1 fL (ref 78.0–100.0)
Monocytes Absolute: 0.6 10*3/uL (ref 0.1–1.0)
Monocytes Relative: 4 % (ref 3–12)
Neutro Abs: 12.4 10*3/uL — ABNORMAL HIGH (ref 1.7–7.7)
Neutrophils Relative %: 84 % — ABNORMAL HIGH (ref 43–77)
Platelets: 201 10*3/uL (ref 150–400)
RBC: 5.03 MIL/uL (ref 4.22–5.81)
RDW: 13 % (ref 11.5–15.5)
WBC: 14.8 10*3/uL — ABNORMAL HIGH (ref 4.0–10.5)

## 2014-08-04 LAB — URINALYSIS, ROUTINE W REFLEX MICROSCOPIC
Bilirubin Urine: NEGATIVE
Glucose, UA: NEGATIVE mg/dL
Ketones, ur: NEGATIVE mg/dL
Leukocytes, UA: NEGATIVE
Nitrite: NEGATIVE
Protein, ur: NEGATIVE mg/dL
Specific Gravity, Urine: 1.02 (ref 1.005–1.030)
Urobilinogen, UA: 0.2 mg/dL (ref 0.0–1.0)
pH: 6 (ref 5.0–8.0)

## 2014-08-04 LAB — COMPREHENSIVE METABOLIC PANEL
ALT: 18 U/L (ref 17–63)
AST: 23 U/L (ref 15–41)
Albumin: 4 g/dL (ref 3.5–5.0)
Alkaline Phosphatase: 77 U/L (ref 38–126)
Anion gap: 9 (ref 5–15)
BUN: 14 mg/dL (ref 6–20)
CO2: 27 mmol/L (ref 22–32)
Calcium: 9 mg/dL (ref 8.9–10.3)
Chloride: 104 mmol/L (ref 101–111)
Creatinine, Ser: 1.12 mg/dL (ref 0.61–1.24)
GFR calc Af Amer: >60 mL/min (ref >60)
GFR calc non Af Amer: >60 mL/min (ref >60)
Glucose, Bld: 135 mg/dL — ABNORMAL HIGH (ref 65–99)
Potassium: 4.4 mmol/L (ref 3.5–5.1)
Sodium: 140 mmol/L (ref 135–145)
Total Bilirubin: 1.2 mg/dL (ref 0.3–1.2)
Total Protein: 7.7 g/dL (ref 6.5–8.1)

## 2014-08-04 LAB — URINE MICROSCOPIC-ADD ON

## 2014-08-04 MED ORDER — IOHEXOL 300 MG/ML  SOLN
100.0000 mL | Freq: Once | INTRAMUSCULAR | Status: AC | PRN
  Administered 2014-08-04: 100 mL via INTRAVENOUS

## 2014-08-04 MED ORDER — FENTANYL CITRATE (PF) 100 MCG/2ML IJ SOLN
50.0000 ug | Freq: Once | INTRAMUSCULAR | Status: AC
  Administered 2014-08-04: 50 ug via INTRAVENOUS
  Filled 2014-08-04: qty 2

## 2014-08-04 MED ORDER — KETOROLAC TROMETHAMINE 30 MG/ML IJ SOLN
15.0000 mg | Freq: Once | INTRAMUSCULAR | Status: AC
  Administered 2014-08-04: 15 mg via INTRAVENOUS
  Filled 2014-08-04: qty 1

## 2014-08-04 MED ORDER — SODIUM CHLORIDE 0.9 % IV BOLUS (SEPSIS)
500.0000 mL | Freq: Once | INTRAVENOUS | Status: AC
  Administered 2014-08-04: 500 mL via INTRAVENOUS

## 2014-08-04 MED ORDER — HYDROCODONE-ACETAMINOPHEN 5-325 MG PO TABS: 2.0000 | ORAL_TABLET | ORAL | Status: DC | PRN

## 2014-08-04 MED ORDER — SODIUM CHLORIDE 0.9 % IJ SOLN
INTRAMUSCULAR | Status: AC
  Filled 2014-08-04: qty 1000

## 2014-08-04 NOTE — ED Notes (Signed)
Pt states that he is unable to provide UA at this time.

## 2014-08-04 NOTE — ED Provider Notes (Signed)
CSN: [id number]     Arrival date & time 08/04/14  1209 History   First MD Initiated Contact with Patient 08/04/14 1501     Chief Complaint  Patient presents with  . Abdominal Pain     (Consider location/radiation/quality/duration/timing/severity/associated sxs/prior Treatment) Patient is a 74 y.o. male presenting with abdominal pain. The history is provided by the patient.  Abdominal Pain Associated symptoms: constipation   Associated symptoms: no chest pain, no diarrhea, no nausea, no shortness of breath and no vomiting    patient presents with abdominal pain. Worsen his lower abdomen and somewhat on his left side. Has history of some chronic abdominal pain. Somewhat difficult history to get from him but he has seen at [facility]. Notes he is being treated for diverticulitis but reviewing the notes from abdomen hospital. Like is treated for right colitis proctitis assumed to be from ulcerative colitis. He's been treated with enemas. States he's been feeling worse last few days. States he normally has around 5 episodes of diarrhea today but has not had any diarrhea today. He has had some nausea vomiting. No dysuria. No fevers. States pain is severe. States it has been having some issues with it over the last couple years.  Past Medical History  Diagnosis Date  . Hypertension   . DJD (degenerative joint disease)   . GERD (gastroesophageal reflux disease)   . Hyperlipidemia   . BPH (benign prostatic hyperplasia)   . Obstructive sleep apnea   . Diverticulosis   . Gastric ulcer 04/17/10    Three 70m gastric ulcers, H.pylori serologies were negative  . Clostridium difficile colitis 04/2005  . Idiopathic chronic inflammatory bowel disease 05/18/2010    left-sided UC  . S/P endoscopy 07/24/10    retained gastric contents, benign bx  . CAD (coronary artery disease) 01/1995  . Reflux 02/1995  . Colitis 2011  . Kidney stone   . COPD (chronic obstructive pulmonary disease)   . Asthma     Past Surgical History  Procedure Laterality Date  . Colonoscopy  04/2005    granularity and friability erosions from rectum to 40cm. Bx infection vs IBD. C. Diff positive at the time.   . Knee surgery  two  . Shoulder surgery  two  . Foot surgery  two  . Tonsillectomy    . Cervical spine surgery      C4-5  . Colonoscopy  05/2010    Rourk: left-sided UC, bx with no dysplasia, shallow diverticula  . Coronary stent placement  01/1995  . Esophagogastroduodenoscopy  07/24/2010    RMPN:TIRWEResophagus  . Colonoscopy [name] 11/07/2012    RXVQ:MGQQ-PYPPJproctocolitis status post segmental biopsy/Sigmoid colon polyps removed as described above. Procedure compromisd by technical difficulties. bx: Inflammation limited to sigmoid and rectum on pathology.  . Cardiovascular stress test  07/21/2009    No scintigraphic evidence of inducible myocardial ischemia  . Transthoracic echocardiogram  03/23/2009    EF 60-65%, normal LV systolic function   Family History  Problem Relation Age of Onset  . Lung cancer Mother   . Cancer Mother     breast  . Diabetes Mother   . Stroke Father   . Hypertension Father   . Colon cancer Neg Hx    History  Substance Use Topics  . Smoking status: Former SResearch scientist (life sciences) . Smokeless tobacco: Former USystems developer   Quit date: 01/15/1970  . Alcohol Use: No    Review of Systems  Constitutional: Negative for activity change and appetite change.  Eyes: Negative for pain.  Respiratory: Negative for chest tightness and shortness of breath.   Cardiovascular: Negative for chest pain and leg swelling.  Gastrointestinal: Positive for abdominal pain and constipation. Negative for nausea, vomiting and diarrhea.  Genitourinary: Positive for flank pain.  Musculoskeletal: Negative for back pain and neck stiffness.  Skin: Negative for rash.  Neurological: Negative for weakness, numbness and headaches.  Psychiatric/Behavioral: Negative for behavioral problems.      Allergies  Review of  patient's allergies indicates no known allergies.  Home Medications   Prior to Admission medications   Medication Sig Start Date End Date Taking? Authorizing Provider  amLODipine (NORVASC) 10 MG tablet TAKE 1 TABLET BY MOUTH EVERY DAY 08/02/14  Yes [name], MD  aspirin 81 MG tablet Take 162 mg by mouth daily.     Yes Historical Provider, MD  balsalazide (COLAZAL) 750 MG capsule Take 2,250 mg by mouth at bedtime.    Yes Historical Provider, MD  doxazosin (CARDURA) 8 MG tablet Take 4 mg by mouth daily.   Yes Historical Provider, MD  furosemide (LASIX) 40 MG tablet Take 40 mg by mouth daily as needed for fluid.   Yes Historical Provider, MD  losartan (COZAAR) 50 MG tablet Take 50 mg by mouth daily. 05/31/14  Yes Historical Provider, MD  metoprolol (LOPRESSOR) 50 MG tablet TAKE 1 TABLET BY MOUTH 3 TIMES A DAY 07/22/14  Yes [name], MD  nitroGLYCERIN (NITROSTAT) 0.4 MG SL tablet Place 0.4 mg under the tongue every 5 (five) minutes as needed for chest pain.   Yes Historical Provider, MD  oxybutynin (DITROPAN XL) 10 MG 24 hr tablet Take 10 mg by mouth at bedtime.   Yes Historical Provider, MD  pravastatin (PRAVACHOL) 40 MG tablet TAKE 1 TABLET BY MOUTH EVERY DAY 08/02/14  Yes [name], MD  valsartan (DIOVAN) 160 MG tablet Take 160 mg by mouth daily.   Yes Historical Provider, MD  HYDROcodone-acetaminophen (NORCO/VICODIN) 5-325 MG per tablet Take 2 tablets by mouth every 4 (four) hours as needed. 08/04/14   Davonna Belling, MD   BP 153/72 mmHg  Pulse 85  Temp(Src) 97.6 F (36.4 C) (Oral)  Resp 18  SpO2 89% Physical Exam  Constitutional: He is oriented to person, place, and time. He appears well-developed and well-nourished.  HENT:  Head: Normocephalic and atraumatic.  Eyes: Pupils are equal, round, and reactive to light.  Cardiovascular: Normal rate, regular rhythm and normal heart sounds.   No murmur heard. Pulmonary/Chest: Effort normal and breath sounds normal.  Abdominal:  Soft. Bowel sounds are normal. He exhibits no distension and no mass. There is tenderness. There is no rebound and no guarding.  Moderate lower abdominal tenderness somewhat worse on left lower quadrant. States there is specific point where it's tender.  Musculoskeletal: Normal range of motion. He exhibits no edema.  Neurological: He is alert and oriented to person, place, and time. No cranial nerve deficit.  Skin: Skin is warm and dry.  Psychiatric: He has a normal mood and affect.  Nursing note and vitals reviewed.   ED Course  Procedures (including critical care time) Labs Review Labs Reviewed  URINALYSIS, ROUTINE W REFLEX MICROSCOPIC (NOT AT [facility]) - Abnormal; Notable for the following:    Hgb urine dipstick SMALL (*)    All other components within normal limits  CBC WITH DIFFERENTIAL/PLATELET - Abnormal; Notable for the following:    WBC 14.8 (*)    Neutrophils Relative % 84 (*)  Neutro Abs 12.4 (*)    All other components within normal limits  COMPREHENSIVE METABOLIC PANEL - Abnormal; Notable for the following:    Glucose, Bld 135 (*)    All other components within normal limits  URINE MICROSCOPIC-ADD ON    Imaging Review Ct Abdomen Pelvis W Contrast  08/04/2014   CLINICAL DATA:  Lower abdominal pain since 650 this morning. History of colitis. Coronary artery disease. Renal stones. Ulcerative colitis. Hypertension.  EXAM: CT ABDOMEN AND PELVIS WITH CONTRAST  TECHNIQUE: Multidetector CT imaging of the abdomen and pelvis was performed using the standard protocol following bolus administration of intravenous contrast.  CONTRAST:  181m OMNIPAQUE IOHEXOL 300 MG/ML  SOLN  COMPARISON:  12/31/2012  FINDINGS: Lower chest: Bibasilar dependent atelectasis. Mild cardiomegaly with multivessel coronary artery atherosclerosis. No pericardial or pleural effusion. Pulmonary artery enlargement.  Hepatobiliary: Old granulomatous disease within the liver. Multiple small gallstones without acute  cholecystitis or biliary duct dilatation.  Pancreas: Normal, without mass or ductal dilatation.  Spleen: Normal  Adrenals/Urinary Tract: Normal right adrenal gland. Mild left adrenal thickening. Interpolar right renal cyst. Bilateral punctate renal collecting system calculi. Too small to characterize interpolar right renal lesion.  Left-sided mild hydroureteronephrosis with delayed contrast excretion from the left kidney. Left hydroureter continues to the level of a 5 mm stone at the left ureterovesicular junction image 94.  Stomach/Bowel: Normal stomach, without wall thickening. Normal colon, appendix, and terminal ileum. Normal small bowel.  Vascular/Lymphatic: Aortic and branch vessel atherosclerosis. Small retroperitoneal nodes are not pathologic by size criteria. No pelvic adenopathy.  Reproductive: Mild prostatomegaly.  Other: No significant free fluid. Tiny fat containing ventral abdominal wall hernia.  Musculoskeletal: Convex right lumbar spine curvature is mild. Partial fusion of the bilateral sacroiliac joints. Prominent syndesmophytes throughout the lumbar spine.  IMPRESSION: 1. Obstructive stone at the left ureterovesicular junction. 2. Bilateral nephrolithiasis. 3. Cholelithiasis. 4. Pulmonary artery enlargement suggests pulmonary arterial hypertension. 5. Lumbar spine findings which are suspicious for ankylosing spondylitis.   Electronically Signed   By: KAbigail MiyamotoM.D.   On: 08/04/2014 17:18     EKG Interpretation None      MDM   Final diagnoses:  Left ureteral stone    Patient with abdominal pain history of colitis. CT scan shows ureteral stone. Pain improved now be discharged home. No UTI.    NDavonna Belling MD 08/05/14 [phone]

## 2014-08-04 NOTE — ED Notes (Signed)
Pt c/o llq abd pain with diarrhea. States he is being treated for diverticulitis.

## 2014-08-04 NOTE — Discharge Instructions (Signed)

## 2014-08-04 NOTE — ED Notes (Signed)
States that he sees a Dr in [facility] for constipation and stomach issues and that they give him stuff to drink for it.  States that he has been straining and there is small amt of bright red blood when he wipes.

## 2014-08-18 DIAGNOSIS — E611 Iron deficiency: Secondary | ICD-10-CM | POA: Insufficient documentation

## 2014-08-26 ENCOUNTER — Other Ambulatory Visit: Payer: Self-pay | Admitting: Family Medicine

## 2014-08-26 NOTE — Telephone Encounter (Signed)
Needs office visit.

## 2014-09-01 ENCOUNTER — Other Ambulatory Visit: Payer: Self-pay | Admitting: Family Medicine

## 2014-10-06 ENCOUNTER — Other Ambulatory Visit: Payer: Self-pay | Admitting: Family Medicine

## 2014-10-08 ENCOUNTER — Other Ambulatory Visit: Payer: Self-pay | Admitting: Family Medicine

## 2014-10-26 ENCOUNTER — Other Ambulatory Visit: Payer: Self-pay | Admitting: Family Medicine

## 2014-10-26 NOTE — Telephone Encounter (Signed)
Needs office visit.

## 2014-10-28 ENCOUNTER — Telehealth: Payer: Self-pay | Admitting: Family Medicine

## 2014-10-28 DIAGNOSIS — Z79899 Other long term (current) drug therapy: Secondary | ICD-10-CM

## 2014-10-28 DIAGNOSIS — I10 Essential (primary) hypertension: Secondary | ICD-10-CM

## 2014-10-28 DIAGNOSIS — E785 Hyperlipidemia, unspecified: Secondary | ICD-10-CM

## 2014-10-28 MED ORDER — AMLODIPINE BESYLATE 10 MG PO TABS: 10.0000 mg | ORAL_TABLET | Freq: Every day | ORAL | Status: DC

## 2014-10-28 MED ORDER — PRAVASTATIN SODIUM 40 MG PO TABS: 40.0000 mg | ORAL_TABLET | Freq: Every day | ORAL | Status: DC

## 2014-10-28 NOTE — Telephone Encounter (Signed)
Patient has not had a med check in [facility]

## 2014-10-28 NOTE — Telephone Encounter (Signed)
Called patient and informed him per Dr.Scott Luking that refills on requested medication has been sent in and labs have been ordered. Patient verbalized understanding.

## 2014-10-28 NOTE — Telephone Encounter (Signed)
May have refills to get him through until the appointment, metabolic 7, lipid liver.

## 2014-10-28 NOTE — Telephone Encounter (Signed)
Due to extended scheduling issues at the office, this patient will need a 30 day supply on  pravastatin (PRAVACHOL) 40 MG tablet amLODipine (NORVASC) 10 MG tablet  Has appt scheduled for 10/13

## 2014-10-28 NOTE — Telephone Encounter (Signed)
losartan (COZAAR) 50 MG tablet

## 2014-11-08 ENCOUNTER — Other Ambulatory Visit: Payer: Self-pay | Admitting: Family Medicine

## 2014-11-08 NOTE — Telephone Encounter (Signed)
Last chronic visit October 2015. May I refill?

## 2014-11-08 NOTE — Telephone Encounter (Signed)
1 refill needs OV

## 2014-11-18 ENCOUNTER — Ambulatory Visit (INDEPENDENT_AMBULATORY_CARE_PROVIDER_SITE_OTHER): Payer: Commercial Managed Care - HMO | Admitting: Family Medicine

## 2014-11-18 ENCOUNTER — Encounter: Payer: Self-pay | Admitting: Family Medicine

## 2014-11-18 VITALS — BP 128/74 | Ht 73.0 in | Wt 289.0 lb

## 2014-11-18 DIAGNOSIS — I10 Essential (primary) hypertension: Secondary | ICD-10-CM | POA: Diagnosis not present

## 2014-11-18 DIAGNOSIS — S5002XA Contusion of left elbow, initial encounter: Secondary | ICD-10-CM | POA: Diagnosis not present

## 2014-11-18 DIAGNOSIS — R7303 Prediabetes: Secondary | ICD-10-CM | POA: Diagnosis not present

## 2014-11-18 DIAGNOSIS — J441 Chronic obstructive pulmonary disease with (acute) exacerbation: Secondary | ICD-10-CM

## 2014-11-18 DIAGNOSIS — Z23 Encounter for immunization: Secondary | ICD-10-CM | POA: Diagnosis not present

## 2014-11-18 DIAGNOSIS — E785 Hyperlipidemia, unspecified: Secondary | ICD-10-CM | POA: Diagnosis not present

## 2014-11-18 LAB — POCT GLYCOSYLATED HEMOGLOBIN (HGB A1C): Hemoglobin A1C: 5.8

## 2014-11-18 MED ORDER — LOSARTAN POTASSIUM 50 MG PO TABS: 50.0000 mg | ORAL_TABLET | Freq: Every day | ORAL | Status: DC

## 2014-11-18 MED ORDER — METOPROLOL TARTRATE 50 MG PO TABS: ORAL_TABLET | ORAL | Status: DC

## 2014-11-18 MED ORDER — AMLODIPINE BESYLATE 10 MG PO TABS: 10.0000 mg | ORAL_TABLET | Freq: Every day | ORAL | Status: DC

## 2014-11-18 MED ORDER — PRAVASTATIN SODIUM 40 MG PO TABS: 40.0000 mg | ORAL_TABLET | Freq: Every day | ORAL | Status: DC

## 2014-11-18 NOTE — Progress Notes (Signed)
   Subjective:    Patient ID: [name], male    DOB: [birth year], 74 y.o.   MRN: [id number]  Hypertension This is a chronic problem. The current episode started more than 1 year ago. Pertinent negatives include no chest pain. Compliance problems include diet and exercise.    Pt fell walking down steps about 2 -3 weeks ago. Bruised left arm. Now has a knot on left elbow and left wrist. Left middle finger swollen since fall. Patient states he did not go to the ER. States pain discomfort somewhat better now.  Prediabetic. A1C 5.8.watch diet closely. Tries to avoid starches. Flu vaccine today.  Patient states his COPD is doing well recently.no longer smokes. Uses albuterol when necessary. Pt would like 90 day supply on all meds. He wants to carry scripts with him. Changing pharm.  Patient denies any chest pressure tightness pain denies swelling in the legs states urination going okay bowels moving okay no blood in bowel movement Follows with gastroenterology for colitis issues. Under good control lately. 25 minutes was spent with the patient. Greater than half the time was spent in discussion and answering questions and counseling regarding the issues that the patient came in for today.   Review of Systems  Constitutional: Negative for activity change, appetite change and fatigue.  HENT: Negative for congestion.   Respiratory: Negative for cough.   Cardiovascular: Negative for chest pain.  Gastrointestinal: Negative for abdominal pain.  Endocrine: Negative for polydipsia and polyphagia.  Neurological: Negative for weakness.  Psychiatric/Behavioral: Negative for confusion.       Objective:   Physical Exam  Constitutional: He appears well-nourished. No distress.  Cardiovascular: Normal rate, regular rhythm and normal heart sounds.   No murmur heard. Pulmonary/Chest: Effort normal and breath sounds normal. No respiratory distress.  Musculoskeletal: He exhibits no edema.    Lymphadenopathy:    He has no cervical adenopathy.  Neurological: He is alert.  Psychiatric: His behavior is normal.  Vitals reviewed.         Assessment & Plan:  1. Prediabetes A1c overall doing well watch diet closely stay physically active difficult for patient to bring weight down - POCT glycosylated hemoglobin (Hb A1C)  2. Encounter for immunization Flu vaccine pneumonia vaccine today  3. Essential hypertension Blood pressure overall good continue medications. Medicines refilled. Watch diet closely  4. COPD exacerbation ([place]) Patient COPD is stable. He is avoiding smoking.he uses albuterol when necessary  5. Hyperlipidemia Hyperlipidemia stable check lab work next visit continue pravastatin watch diet  6. Left elbow contusion, initial encounter No need for x-rays. Contusion should gradually get better on its own.  7. Need for vaccination today - Pneumococcal conjugate vaccine 13-valent IM

## 2014-11-26 ENCOUNTER — Encounter: Payer: Self-pay | Admitting: Family Medicine

## 2014-11-26 LAB — BASIC METABOLIC PANEL
BUN/Creatinine Ratio: 12 (ref 10–22)
BUN: 11 mg/dL (ref 8–27)
CO2: 24 mmol/L (ref 18–29)
Calcium: 9.5 mg/dL (ref 8.6–10.2)
Chloride: 101 mmol/L (ref 97–106)
Creatinine, Ser: 0.9 mg/dL (ref 0.76–1.27)
GFR calc Af Amer: 96 mL/min/{1.73_m2} (ref >59)
GFR calc non Af Amer: 83 mL/min/{1.73_m2} (ref >59)
Glucose: 98 mg/dL (ref 65–99)
Potassium: 4.5 mmol/L (ref 3.5–5.2)
Sodium: 142 mmol/L (ref 136–144)

## 2014-11-26 LAB — LIPID PANEL
Chol/HDL Ratio: 3.1 ratio units (ref 0.0–5.0)
Cholesterol, Total: 173 mg/dL (ref 100–199)
HDL: 55 mg/dL (ref >39)
LDL Calculated: 85 mg/dL (ref 0–99)
Triglycerides: 164 mg/dL — ABNORMAL HIGH (ref 0–149)
VLDL Cholesterol Cal: 33 mg/dL (ref 5–40)

## 2014-11-26 LAB — HEPATIC FUNCTION PANEL
ALT: 15 IU/L (ref 0–44)
AST: 26 IU/L (ref 0–40)
Albumin: 4.1 g/dL (ref 3.5–4.8)
Alkaline Phosphatase: 91 IU/L (ref 39–117)
Bilirubin Total: 0.6 mg/dL (ref 0.0–1.2)
Bilirubin, Direct: 0.14 mg/dL (ref 0.00–0.40)
Total Protein: 7.1 g/dL (ref 6.0–8.5)

## 2014-12-25 ENCOUNTER — Other Ambulatory Visit: Payer: Self-pay | Admitting: Family Medicine

## 2015-05-23 ENCOUNTER — Telehealth: Payer: Self-pay | Admitting: Family Medicine

## 2015-05-23 NOTE — Telephone Encounter (Signed)
Pt is requesting a prescription for a new cpap machine through [facility].

## 2015-05-23 NOTE — Telephone Encounter (Signed)
Please give order for new CPAP machine. will need to pull his paper chart find out when the last CPAP/sleep study was done as well as titration in order to give to the home health agency along with the prescription. Certainly it is possible patient may end up needing to have another sleep study with auto titration?

## 2015-05-24 ENCOUNTER — Telehealth: Payer: Self-pay | Admitting: Family Medicine

## 2015-05-24 NOTE — Telephone Encounter (Signed)
Spoke with patient's daughter and informed her per Dr.Scott Luking - In order to meet this criteria the patient will need standard office visit within the next few weeks in order to document that his CPAP machine is working and helping him. -required by home health and insurance. Patient's daughter verbalized understanding and was transferred to front desk to schedule office visit with Dr.Scott Luking in the next few weeks.

## 2015-05-24 NOTE — Telephone Encounter (Signed)
CPAP prescription and last sleep study from paper chart faxed to [facility].

## 2015-05-24 NOTE — Telephone Encounter (Signed)
Spoke with patient's daughter and informed her per Dr.Scott Luking-I would recommend scheduling him within the next 2 weeks to look at that touch base on chronic health issues, then we can give referral. Patient's daughter verbalized understanding.

## 2015-05-24 NOTE — Telephone Encounter (Signed)
[name] is due for an office visit anyways. I would recommend scheduling him within the next 2 weeks to look at that touch base on chronic health issues, then we can give referral

## 2015-05-24 NOTE — Telephone Encounter (Signed)
[facility] for pt's daughter She LMOM for me requesting referral to derm for spots on pt's neck Just need to know if they have a preference on where to refer to & name of doc or practice so I can find phone #

## 2015-05-24 NOTE — Telephone Encounter (Signed)
Spoke with pt's daughter, she states patient needs referral to dermatology in [facility] requires referral to [facility] provider, need diagnosis for referral)  States there are spots on his neck that have darkened and needs to have them checked due to history of melanoma (didn't see this in his history)  Please advise  Call daughter [phone] - [name]

## 2015-05-24 NOTE — Telephone Encounter (Signed)
Received fax from [facility] after faxing the prescription for replacement CPAP. Was informed that we now also need to fax an office note that is within the past 6 months stating that CPAP machine is working. Also they need to know what the script to have the pressure setting. The last pressure setting that they have for patient is from 2011 and it was set at 12. Please advise?

## 2015-05-24 NOTE — Telephone Encounter (Signed)
[facility]

## 2015-05-24 NOTE — Telephone Encounter (Signed)
In order to meet this criteria the patient will need standard office visit within the next few weeks in order to document that his CPAP machine is working and helping him. Obviously explained to the patient why this is necessary-required by home health and insurance

## 2015-06-06 ENCOUNTER — Encounter: Payer: Self-pay | Admitting: Family Medicine

## 2015-06-06 ENCOUNTER — Ambulatory Visit (INDEPENDENT_AMBULATORY_CARE_PROVIDER_SITE_OTHER): Payer: Commercial Managed Care - HMO | Admitting: Family Medicine

## 2015-06-06 VITALS — BP 124/68 | Ht 73.0 in | Wt 301.2 lb

## 2015-06-06 DIAGNOSIS — I10 Essential (primary) hypertension: Secondary | ICD-10-CM | POA: Diagnosis not present

## 2015-06-06 DIAGNOSIS — G4733 Obstructive sleep apnea (adult) (pediatric): Secondary | ICD-10-CM | POA: Diagnosis not present

## 2015-06-06 DIAGNOSIS — N528 Other male erectile dysfunction: Secondary | ICD-10-CM

## 2015-06-06 DIAGNOSIS — N529 Male erectile dysfunction, unspecified: Secondary | ICD-10-CM | POA: Insufficient documentation

## 2015-06-06 DIAGNOSIS — E785 Hyperlipidemia, unspecified: Secondary | ICD-10-CM

## 2015-06-06 DIAGNOSIS — D229 Melanocytic nevi, unspecified: Secondary | ICD-10-CM | POA: Diagnosis not present

## 2015-06-06 MED ORDER — SILDENAFIL CITRATE 20 MG PO TABS: ORAL_TABLET | ORAL | Status: DC

## 2015-06-06 NOTE — Progress Notes (Addendum)
   Subjective:    Patient ID: [name], male    DOB: [birth year], 75 y.o.   MRN: [id number]  Hypertension This is a chronic problem. The current episode started more than 1 year ago. The problem has been gradually improving since onset. Pertinent negatives include no chest pain. There are no associated agents to hypertension. There are no known risk factors for coronary artery disease. Treatments tried: metoprolol. The current treatment provides moderate improvement. There are no compliance problems.    Patient wants a mole removed on the right side of his neck.  Patient also has some places on his back he wants the doctor to look at.  Patient needs a script for a new CPAP machine.   Patient states his blood pressure medicine on a regular basis denies any heart disease issues currently has not had use nitroglycerin Patient having some issues with erectile dysfunction requesting medication Patient has sleep apnea needs a new prescription for his CPAP machine. Hyperlipidemia issues in the past. Takes his medication on a regular basis. Patient has multiple growths on his back and neck he is concerned with would like to see dermatology. This patient doesn't fact have sleep apnea. He does benefit from using his CPAP machine. He states that recently he has had problems with his machine is not been able to use it. It is medically indicated for this patient be on a CPAP machine. We hope to be able to get him a new CPAP machine and proper supplies for it.  Review of Systems  Constitutional: Negative for activity change, appetite change and fatigue.  HENT: Negative for congestion.   Respiratory: Negative for cough.   Cardiovascular: Negative for chest pain.  Gastrointestinal: Negative for abdominal pain.  Endocrine: Negative for polydipsia and polyphagia.  Neurological: Negative for weakness.  Psychiatric/Behavioral: Negative for confusion.       Objective:   Physical Exam    Constitutional: He appears well-nourished. No distress.  Cardiovascular: Normal rate, regular rhythm and normal heart sounds.   No murmur heard. Pulmonary/Chest: Effort normal and breath sounds normal. No respiratory distress.  Musculoskeletal: He exhibits no edema.  Lymphadenopathy:    He has no cervical adenopathy.  Neurological: He is alert.  Psychiatric: His behavior is normal.  Vitals reviewed.   25 minutes was spent with the patient. Greater than half the time was spent in discussion and answering questions and counseling regarding the issues that the patient came in for today.       Assessment & Plan:  1. Essential hypertension Blood pressure good control continue current measures no sign of recurrence of his heart disease currently check lab work previous labs reviewed with patient - Lipid panel - Hepatic function panel - Basic metabolic panel  2. Obstructive sleep apnea Sleep apnea needs a new prescription for a new machine. We will get this filled out into the proper people - Lipid panel - Hepatic function panel - Basic metabolic panel  3. Hyperlipidemia Hyperlipidemia continue current medication not causing him problems check lab work previous labs reviewed - Lipid panel - Hepatic function panel - Basic metabolic panel  4. Nevus These appear benign to me but will go ahead and be seen by dermatology seborrheic keratosis - Ambulatory referral to Dermatology  5. Other male erectile dysfunction Generic of Viagra in the way to take it was discussed with the patient know nitroglycerin with this  [place] up 6 months

## 2015-06-08 ENCOUNTER — Other Ambulatory Visit: Payer: Self-pay | Admitting: Family Medicine

## 2015-06-08 LAB — HEPATIC FUNCTION PANEL
ALT: 31 U/L (ref 9–46)
AST: 29 U/L (ref 10–35)
Albumin: 3.9 g/dL (ref 3.6–5.1)
Alkaline Phosphatase: 78 U/L (ref 40–115)
Bilirubin, Direct: 0.2 mg/dL
Indirect Bilirubin: 0.7 mg/dL (ref 0.2–1.2)
Total Bilirubin: 0.9 mg/dL (ref 0.2–1.2)
Total Protein: 7 g/dL (ref 6.1–8.1)

## 2015-06-08 LAB — BASIC METABOLIC PANEL
BUN: 20 mg/dL (ref 7–25)
CO2: 28 mmol/L (ref 20–31)
Calcium: 9.4 mg/dL (ref 8.6–10.3)
Chloride: 105 mmol/L (ref 98–110)
Creat: 1.09 mg/dL (ref 0.70–1.18)
Glucose, Bld: 117 mg/dL — ABNORMAL HIGH (ref 65–99)
Potassium: 4.2 mmol/L (ref 3.5–5.3)
Sodium: 143 mmol/L (ref 135–146)

## 2015-06-08 LAB — LIPID PANEL
Cholesterol: 159 mg/dL (ref 125–200)
HDL: 50 mg/dL
LDL Cholesterol: 76 mg/dL
Total CHOL/HDL Ratio: 3.2 ratio
Triglycerides: 166 mg/dL — ABNORMAL HIGH
VLDL: 33 mg/dL — ABNORMAL HIGH

## 2015-06-09 ENCOUNTER — Telehealth: Payer: Self-pay | Admitting: Family Medicine

## 2015-06-09 ENCOUNTER — Encounter: Payer: Self-pay | Admitting: Family Medicine

## 2015-06-09 NOTE — Telephone Encounter (Signed)
[facility] - need to clarify - referral is to Dr. Tarri Glenn (he is not part of [facility] network-due to insurance may want to stay within [facility] network)

## 2015-06-14 ENCOUNTER — Encounter: Payer: Self-pay | Admitting: Family Medicine

## 2015-06-14 NOTE — Telephone Encounter (Signed)
Spoke with daughter, pt prefers someone in the [facility] network

## 2015-07-13 ENCOUNTER — Encounter: Payer: Self-pay | Admitting: Internal Medicine

## 2015-09-01 ENCOUNTER — Other Ambulatory Visit: Payer: Self-pay | Admitting: Family Medicine

## 2015-11-30 ENCOUNTER — Encounter: Payer: Self-pay | Admitting: Family Medicine

## 2015-11-30 ENCOUNTER — Telehealth: Payer: Self-pay | Admitting: Family Medicine

## 2015-11-30 DIAGNOSIS — Z85828 Personal history of other malignant neoplasm of skin: Secondary | ICD-10-CM | POA: Insufficient documentation

## 2015-11-30 NOTE — Telephone Encounter (Signed)
Review derma pathology report from the [facility].

## 2015-12-02 ENCOUNTER — Other Ambulatory Visit: Payer: Self-pay | Admitting: Family Medicine

## 2015-12-30 ENCOUNTER — Other Ambulatory Visit: Payer: Self-pay | Admitting: Family Medicine

## 2016-01-03 ENCOUNTER — Other Ambulatory Visit: Payer: Self-pay | Admitting: Family Medicine

## 2016-02-07 ENCOUNTER — Encounter: Payer: Self-pay | Admitting: Family Medicine

## 2016-02-07 ENCOUNTER — Ambulatory Visit (INDEPENDENT_AMBULATORY_CARE_PROVIDER_SITE_OTHER): Payer: Medicare HMO | Admitting: Family Medicine

## 2016-02-07 VITALS — BP 116/64 | Temp 98.4°F

## 2016-02-07 DIAGNOSIS — G4733 Obstructive sleep apnea (adult) (pediatric): Secondary | ICD-10-CM | POA: Diagnosis not present

## 2016-02-07 DIAGNOSIS — K5190 Ulcerative colitis, unspecified, without complications: Secondary | ICD-10-CM

## 2016-02-07 DIAGNOSIS — R1012 Left upper quadrant pain: Secondary | ICD-10-CM

## 2016-02-07 DIAGNOSIS — R195 Other fecal abnormalities: Secondary | ICD-10-CM | POA: Diagnosis not present

## 2016-02-07 DIAGNOSIS — K625 Hemorrhage of anus and rectum: Secondary | ICD-10-CM

## 2016-02-07 LAB — POCT HEMOGLOBIN: Hemoglobin: 13.2 g/dL — AB (ref 14.1–18.1)

## 2016-02-07 MED ORDER — METRONIDAZOLE 500 MG PO TABS: 500.0000 mg | ORAL_TABLET | Freq: Two times a day (BID) | ORAL | 0 refills | Status: DC

## 2016-02-07 MED ORDER — CIPROFLOXACIN HCL 500 MG PO TABS: 500.0000 mg | ORAL_TABLET | Freq: Two times a day (BID) | ORAL | 0 refills | Status: DC

## 2016-02-07 NOTE — Progress Notes (Signed)
   Subjective:    Patient ID: [name], male    DOB: [birth year], 76 y.o.   MRN: [id number]  Abdominal Pain  This is a new problem. Episode onset: one week. Associated symptoms include diarrhea. Pertinent negatives include no fever or nausea. Associated symptoms comments: Black stools, diarrhea, . Treatments tried: pepto bismol, tylenol, antidiarrhea meds.   This patient also states he needs a new CPAP machine unable to get one because his last sleep study was years ago states he needs a new sleep study he has all the symptoms of sleep apnea fatigue tiredness positive with breathing snoring not feeling good low energy sleepiness throughout the day  This patient has a history of ulcerative colitis is up-to-date on colonoscopies but is having tenderness on the left side the abdomen along with some intermittent black stools recently taken Pepto-Bismol which could be contributing to some of the stools being black   Review of Systems  Constitutional: Positive for fatigue. Negative for fever.  HENT: Negative for congestion.   Respiratory: Negative for cough and shortness of breath.   Cardiovascular: Negative for chest pain.  Gastrointestinal: Positive for abdominal pain, blood in stool and diarrhea. Negative for nausea.  Neurological: Positive for dizziness.       Objective:   Physical Exam Lungs are clear no crackles heart regular neck is thick with a smaller airway Abdomen soft obese mild tenderness in the left upper quadrant left mid quadrant rectal exam heme positive not gross  Rectal exam heme positive stool appears normal     Assessment & Plan:  Rectal bleeding-lab work indicated. Possibility of ulcerative colitis returning possibility of diverticulitis with bleeding Cover with antibiotics Patient does not one ago through CT scan Referral back to gastroenterology more than likely will end up needing to have colonoscopy at some point Patient states previous medications were  difficult for him to take because of the cost  Also referral back for sleep study because patient needs new sleep study in order to get CPAP machine

## 2016-02-08 ENCOUNTER — Encounter: Payer: Self-pay | Admitting: Family Medicine

## 2016-02-08 LAB — BASIC METABOLIC PANEL
BUN/Creatinine Ratio: 11 (ref 10–24)
BUN: 12 mg/dL (ref 8–27)
CO2: 25 mmol/L (ref 18–29)
Calcium: 8.6 mg/dL (ref 8.6–10.2)
Chloride: 106 mmol/L (ref 96–106)
Creatinine, Ser: 1.05 mg/dL (ref 0.76–1.27)
GFR calc Af Amer: 79 mL/min/{1.73_m2} (ref >59)
GFR calc non Af Amer: 68 mL/min/{1.73_m2} (ref >59)
Glucose: 111 mg/dL — ABNORMAL HIGH (ref 65–99)
Potassium: 3.9 mmol/L (ref 3.5–5.2)
Sodium: 146 mmol/L — ABNORMAL HIGH (ref 134–144)

## 2016-02-08 LAB — CBC WITH DIFFERENTIAL/PLATELET
Basophils Absolute: 0.1 10*3/uL (ref 0.0–0.2)
Basos: 1 %
EOS (ABSOLUTE): 0.2 10*3/uL (ref 0.0–0.4)
Eos: 2 %
Hematocrit: 42.4 % (ref 37.5–51.0)
Hemoglobin: 14.2 g/dL (ref 13.0–17.7)
Immature Grans (Abs): 0 10*3/uL (ref 0.0–0.1)
Immature Granulocytes: 0 %
Lymphocytes Absolute: 3.4 10*3/uL — ABNORMAL HIGH (ref 0.7–3.1)
Lymphs: 25 %
MCH: 29.2 pg (ref 26.6–33.0)
MCHC: 33.5 g/dL (ref 31.5–35.7)
MCV: 87 fL (ref 79–97)
Monocytes Absolute: 1.6 10*3/uL — ABNORMAL HIGH (ref 0.1–0.9)
Monocytes: 12 %
Neutrophils Absolute: 8.1 10*3/uL — ABNORMAL HIGH (ref 1.4–7.0)
Neutrophils: 60 %
Platelets: 246 10*3/uL (ref 150–379)
RBC: 4.86 x10E6/uL (ref 4.14–5.80)
RDW: 13.8 % (ref 12.3–15.4)
WBC: 13.4 10*3/uL — ABNORMAL HIGH (ref 3.4–10.8)

## 2016-02-08 LAB — HEPATIC FUNCTION PANEL
ALT: 20 IU/L (ref 0–44)
AST: 20 IU/L (ref 0–40)
Albumin: 3.4 g/dL — ABNORMAL LOW (ref 3.5–4.8)
Alkaline Phosphatase: 90 IU/L (ref 39–117)
Bilirubin Total: 0.8 mg/dL (ref 0.0–1.2)
Bilirubin, Direct: 0.39 mg/dL (ref 0.00–0.40)
Total Protein: 6.5 g/dL (ref 6.0–8.5)

## 2016-02-08 LAB — LIPASE: Lipase: 31 U/L (ref 13–78)

## 2016-02-09 ENCOUNTER — Encounter: Payer: Self-pay | Admitting: Internal Medicine

## 2016-02-09 ENCOUNTER — Encounter: Payer: Self-pay | Admitting: Family Medicine

## 2016-02-17 ENCOUNTER — Encounter: Payer: Self-pay | Admitting: Pediatrics

## 2016-02-20 ENCOUNTER — Ambulatory Visit: Payer: Medicare HMO | Attending: Family Medicine | Admitting: Neurology

## 2016-02-20 DIAGNOSIS — G4733 Obstructive sleep apnea (adult) (pediatric): Secondary | ICD-10-CM | POA: Insufficient documentation

## 2016-02-20 DIAGNOSIS — Z7982 Long term (current) use of aspirin: Secondary | ICD-10-CM | POA: Insufficient documentation

## 2016-02-20 DIAGNOSIS — Z79899 Other long term (current) drug therapy: Secondary | ICD-10-CM | POA: Insufficient documentation

## 2016-02-23 ENCOUNTER — Other Ambulatory Visit: Payer: Self-pay | Admitting: Family Medicine

## 2016-02-23 NOTE — Procedures (Signed)
[place] A. Merlene Laughter, MD     [email or web address]             NOCTURNAL POLYSOMNOGRAPHY   LOCATION: [facility]  Patient Name: [name], [name] Date: 02/20/2016 Gender: Male D.O.B: [birth year] Age (years): 75 Referring Provider: Sallee Lange Height (inches): 74 Interpreting Physician: Phillips Odor MD, ABSM Weight (lbs): 298 RPSGT: Rosebud Poles BMI: 38 MRN: [id number] Neck Size: 19.00 CLINICAL INFORMATION Sleep Study Type: Split Night CPAP  Indication for sleep study: OSA, Snoring  Epworth Sleepiness Score: 15  SLEEP STUDY TECHNIQUE As per the AASM Manual for the Scoring of Sleep and Associated Events v2.3 (April 2016) with a hypopnea requiring 4% desaturations.  The channels recorded and monitored were frontal, central and occipital EEG, electrooculogram (EOG), submentalis EMG (chin), nasal and oral airflow, thoracic and abdominal wall motion, anterior tibialis EMG, snore microphone, electrocardiogram, and pulse oximetry. Continuous positive airway pressure (CPAP) was initiated when the patient met split night criteria and was titrated according to treat sleep-disordered breathing.  MEDICATIONS Medications self-administered by patient taken the night of the study : [name]  Current Outpatient Prescriptions:  .  amLODipine (NORVASC) 10 MG tablet, TAKE ONE TABLET BY MOUTH ONCE DAILY, Disp: 90 tablet, Rfl: 0 .  aspirin 81 MG tablet, Take 162 mg by mouth daily.  , Disp: , Rfl:  .  ciprofloxacin (CIPRO) 500 MG tablet, Take 1 tablet (500 mg total) by mouth 2 (two) times daily., Disp: 14 tablet, Rfl: 0 .  losartan (COZAAR) 50 MG tablet, TAKE ONE TABLET BY MOUTH ONCE DAILY, Disp: 30 tablet, Rfl: 0 .  losartan (COZAAR) 50 MG tablet, TAKE ONE TABLET BY MOUTH ONCE DAILY, Disp: 90 tablet, Rfl: 0 .  metoprolol (LOPRESSOR) 50 MG tablet, TAKE ONE TABLET BY MOUTH ONCE DAILY IN THE MORNING AND TWO AT BEDTIME, Disp: 270 tablet, Rfl: 0 .  metroNIDAZOLE (FLAGYL) 500 MG  tablet, Take 1 tablet (500 mg total) by mouth 2 (two) times daily with a meal., Disp: 14 tablet, Rfl: 0 .  nitroGLYCERIN (NITROSTAT) 0.4 MG SL tablet, Place 0.4 mg under the tongue every 5 (five) minutes as needed for chest pain., Disp: , Rfl:  .  pravastatin (PRAVACHOL) 40 MG tablet, TAKE ONE TABLET BY MOUTH ONCE DAILY, Disp: 90 tablet, Rfl: 0 .  sildenafil (REVATIO) 20 MG tablet, Take 3 to 5 tablets before relations prn, Disp: 30 tablet, Rfl: 4   RESPIRATORY PARAMETERS Diagnostic  Total AHI (/hr): 95.3 RDI (/hr): 95.3 OA Index (/hr): 65.5 CA Index (/hr): 1.7 REM AHI (/hr): 78.4 NREM AHI (/hr): 98.8 Supine AHI (/hr): 95.8 Non-supine AHI (/hr): 92.50 Min O2 Sat (%): 68.00 Mean O2 (%): 87.88 Time below 88% (min): 71.2     Titration  Optimal Pressure (cm): 15 AHI at Optimal Pressure (/hr): 12 Min O2 at Optimal Pressure (%): 86.00 Supine % at Optimal (%): [name] Sleep % at Optimal (%): [name]     SLEEP ARCHITECTURE The recording time for the entire night was 378.9 minutes.  During a baseline period of 173.2 minutes, the patient slept for 142.9 minutes in REM and nonREM, yielding a sleep efficiency of 82.5%. Sleep onset after lights out was 6.8 minutes with a REM latency of 77.5 minutes. The patient spent 3.85% of the night in stage N1 sleep, 78.70% in stage N2 sleep, 0.31% in stage N3 and 17.14% in REM.  During the titration period of 188.9 minutes, the patient slept for 179.1 minutes in REM and nonREM, yielding a sleep efficiency of 94.8%.  Sleep onset after CPAP initiation was 1.3 minutes with a REM latency of 88.5 minutes. The patient spent 1.68% of the night in stage N1 sleep, 67.89% in stage N2 sleep, 9.49% in stage N3 and 20.94% in REM.  CARDIAC DATA The 2 lead EKG demonstrated sinus rhythm. The mean heart rate was [name] beats per minute. Other EKG findings include: None. LEG MOVEMENT DATA The total Periodic Limb Movements of Sleep (PLMS) were 0. The PLMS index was 0.00.  IMPRESSIONS - Severe  obstructive sleep apnea occurred during the diagnostic portion of the study (AHI = 95.3/hour). The patient did descend on positive pressure titration although not entirely optimal. The best pressure is 15 cm water.    A , MD Diplomate, American Board of Sleep Medicine.   

## 2016-02-28 ENCOUNTER — Encounter: Payer: Self-pay | Admitting: Internal Medicine

## 2016-02-28 ENCOUNTER — Ambulatory Visit (INDEPENDENT_AMBULATORY_CARE_PROVIDER_SITE_OTHER): Payer: Medicare HMO | Admitting: Internal Medicine

## 2016-02-28 VITALS — BP 145/75 | HR 60 | Temp 97.6°F | Ht 75.0 in | Wt 290.9 lb

## 2016-02-28 DIAGNOSIS — K5902 Outlet dysfunction constipation: Secondary | ICD-10-CM | POA: Diagnosis not present

## 2016-02-28 DIAGNOSIS — K529 Noninfective gastroenteritis and colitis, unspecified: Secondary | ICD-10-CM

## 2016-02-28 DIAGNOSIS — K51519 Left sided colitis with unspecified complications: Secondary | ICD-10-CM

## 2016-02-28 NOTE — Progress Notes (Signed)
Primary Care Physician:  Sallee Lange, MD Primary Gastroenterologist:  Dr. Gala Romney  Pre-Procedure History & Physical: HPI:  [name] is a 76 y.o. male here for evaluation of a recent dark stools. Had diarrheal illness over Christmas;  girlfriend sick as well. Lasted about 5 days;  took Pepto-Bismol during this time stools were dark. He has recovered. He feels he is doing well. States he has 1-2 formed normal colored bowel movements daily about every week to week and a half he has episodes of fall diarrhea/fecal incontinence and then it resolves.  Denies hematochezia. Long history of well-documented left-sided proctocolitis. History of fluctuating symptoms. Long history of noncompliance. Saw Dr. Eduard Roux at [facility] 2 years ago. His evaluation included EGD colonoscopy and anorectal manometry. Endoscopically was felt to have well localized proctocolitis limited to the sigmoid. Biopsies of colon demonstrated no abnormality. Biopsies of the rectum demonstrated moderately active proctitis. Also, he underwent an EGD; small bowel biopsies were negative. Anorectal manometry demonstrated some asymmetry of the anorectum concerning for possible fissure.  In addition, he had paradoxical muscle contractions with straining consistent with pelvic floor dyssynergy.  Biofeedback therapy, which is very effective for this disorder, was recommended but patient never had it done.  Patient states over the holidays he started an OTC "Omega" supplement which he feels has helped his bowels considerably.  Patient denies any NSAIDs other than a 81 mg aspirin daily. Past Medical History:  Diagnosis Date  . Asthma   . BPH (benign prostatic hyperplasia)   . CAD (coronary artery disease) 01/1995  . Clostridium difficile colitis 04/2005  . Colitis 2011  . COPD (chronic obstructive pulmonary disease) ([place])   . Diverticulosis   . DJD (degenerative joint disease)   . Gastric ulcer 04/17/10   Three 43m gastric  ulcers, H.pylori serologies were negative  . GERD (gastroesophageal reflux disease)   . Hyperlipidemia   . Hypertension   . Idiopathic chronic inflammatory bowel disease 05/18/2010   left-sided UC  . Kidney stone   . Obstructive sleep apnea   . Reflux 02/1995  . S/P endoscopy 07/24/10   retained gastric contents, benign bx    Past Surgical History:  Procedure Laterality Date  . CARDIOVASCULAR STRESS TEST  07/21/2009   No scintigraphic evidence of inducible myocardial ischemia  . CERVICAL SPINE SURGERY     C4-5  . COLONOSCOPY  04/2005   granularity and friability erosions from rectum to 40cm. Bx infection vs IBD. C. Diff positive at the time.   . COLONOSCOPY  05/2010   Gibran Veselka: left-sided UC, bx with no dysplasia, shallow diverticula  . COLONOSCOPY [name] 11/07/2012   RZOX:[id number] status post segmental biopsy/Sigmoid colon polyps removed as described above. Procedure compromisd by technical difficulties. bx: Inflammation limited to sigmoid and rectum on pathology.  . CORONARY STENT PLACEMENT  01/1995  . ESOPHAGOGASTRODUODENOSCOPY  07/24/2010   RWJX:BJYNWGesophagus  . FOOT SURGERY  two  . KNEE SURGERY  two  . SHOULDER SURGERY  two  . TONSILLECTOMY    . TRANSTHORACIC ECHOCARDIOGRAM  03/23/2009   EF 60-65%, normal LV systolic function    Prior to Admission medications   Medication Sig Start Date End Date Taking? Authorizing Provider  amLODipine (NORVASC) 10 MG tablet TAKE ONE TABLET BY MOUTH ONCE DAILY 02/24/16  Yes SKathyrn Drown MD  aspirin 81 MG tablet Take 162 mg by mouth daily.     Yes Historical Provider, MD  losartan (COZAAR) 50 MG tablet TAKE ONE TABLET BY  MOUTH ONCE DAILY 12/30/15  Yes [name], MD  losartan (COZAAR) 50 MG tablet TAKE ONE TABLET BY MOUTH ONCE DAILY 01/03/16  Yes [name], MD  metoprolol (LOPRESSOR) 50 MG tablet TAKE ONE TABLET BY MOUTH ONCE DAILY IN THE MORNING AND  2  TABLETS  AT  BEDTIME 02/24/16  Yes [name], MD  nitroGLYCERIN  (NITROSTAT) 0.4 MG SL tablet Place 0.4 mg under the tongue every 5 (five) minutes as needed for chest pain.   Yes Historical Provider, MD  pravastatin (PRAVACHOL) 40 MG tablet TAKE ONE TABLET BY MOUTH ONCE DAILY 02/24/16  Yes [name], MD  sildenafil (REVATIO) 20 MG tablet Take 3 to 5 tablets before relations prn 06/06/15  Yes [name], MD  ciprofloxacin (CIPRO) 500 MG tablet Take 1 tablet (500 mg total) by mouth 2 (two) times daily. Patient not taking: Reported on 02/28/2016 02/07/16   [name], MD  metroNIDAZOLE (FLAGYL) 500 MG tablet Take 1 tablet (500 mg total) by mouth 2 (two) times daily with a meal. Patient not taking: Reported on 02/28/2016 02/07/16   [name], MD    Allergies as of 02/28/2016  . (No Known Allergies)    Family History  Problem Relation Age of Onset  . Lung cancer Mother   . Cancer Mother     breast  . Diabetes Mother   . Stroke Father   . Hypertension Father   . Colon cancer Neg Hx     Social History   Social History  . Marital status: Widowed    Spouse name: [name]  . Number of children: [name]  . Years of education: [name]   Occupational History  . maintenance     retired   Social History Main Topics  . Smoking status: Former Research scientist (life sciences)  . Smokeless tobacco: Former Systems developer    Quit date: 01/15/1970  . Alcohol use No  . Drug use: No  . Sexual activity: Yes    Partners: Female    Birth control/ protection: None     Comment: spouse   Other Topics Concern  . Not on file   Social History Narrative  . No narrative on file    Review of Systems: See HPI, otherwise negative ROS  Physical Exam: BP (!) 145/75   Pulse 60   Temp 97.6 F (36.4 C) (Oral)   Ht 6' 3"  (1.905 m)   Wt 290 lb 14.4 oz (132 kg)   BMI 36.36 kg/m  General:   Alert,  pleasant and cooperative in NAD Neck:  Supple; no masses or thyromegaly. No significant cervical adenopathy. Lungs:  Clear throughout to auscultation.   No wheezes, crackles, or rhonchi. No acute  distress. Heart:  Regular rate and rhythm; no murmurs, clicks, rubs,  or gallops. Abdomen: obese. Non-distended, normal bowel sounds.  Soft and nontender without appreciable mass or hepatosplenomegaly.  Pulses:  Normal pulses noted. Extremities:  Without clubbing or edema. Rectal:  No external lesions. Good sphincter tone. Scant formed brown stool in the rectal vault. Hemoccult negative.   Impression:  Pleasant 76 year old gentleman long-standing distal left-sided proctocolitis. Currently asymptomatic. Sounds like he possibly had a viral or foodborne illness over the Christmas holidays. Dark stools more likely related to Pepto-Bismol rather than bleeding.  Currently, does not have any typical IBD symptoms. Pelvic floor dyssynergy diagnosed 2 years ago. No treatment as of yet. Biofeedback is very simple and effective for this disorder. I suspect his symptoms he has every  1-2 weeks is more related over flow diarrhea-as suggested by Dr. Eduard Roux and/or a component of IBS.   No need for immediate colonoscopy although as time goes by there is an increased risk of colon cancer even in the setting of limited left-sided disease.   Keeping in mind his age, it may be prudent to consider 1 more screening colonoscopy in the next 1-2 years.   Clinically, IBD in remission. No rush at this point in time to start him back on specific IBD therapy.  Patient somewhat noncompliant over time.  Recommendations:  Proceed with scheduling biofeedback for pelvic floor dyssynergy (functional outlet obstruction).  CBC, CRP and sedimentation rate today   Office follow-up appointment 4 months       Notice: This dictation was prepared with Dragon dictation along with smaller phrase technology. Any transcriptional errors that result from this process are unintentional and may not be corrected upon review.

## 2016-02-28 NOTE — Patient Instructions (Signed)
CBC, CRP and SED rate  Arrange Biofeedback for pelvic floor dysenergy either at [facility] or [place] visit in 4 months  May need a colonoscopy later in the year  Further recommendations to follow

## 2016-02-29 ENCOUNTER — Telehealth: Payer: Self-pay

## 2016-02-29 ENCOUNTER — Other Ambulatory Visit: Payer: Self-pay

## 2016-02-29 DIAGNOSIS — K51519 Left sided colitis with unspecified complications: Secondary | ICD-10-CM | POA: Diagnosis not present

## 2016-02-29 DIAGNOSIS — M6289 Other specified disorders of muscle: Secondary | ICD-10-CM

## 2016-02-29 NOTE — Telephone Encounter (Signed)
Unable to find anyone who does Biofeedback for pelvic floor in [place]. Called [facility]. They faxed physical therapy order form. I completed order form and faxed it back to [phone] and included demographics and insurance card. Called and informed pt.

## 2016-03-01 LAB — CBC WITH DIFFERENTIAL/PLATELET
Basophils Absolute: 0.1 10*3/uL (ref 0.0–0.2)
Basos: 1 %
EOS (ABSOLUTE): 0.4 10*3/uL (ref 0.0–0.4)
Eos: 4 %
Hematocrit: 46.6 % (ref 37.5–51.0)
Hemoglobin: 15.1 g/dL (ref 13.0–17.7)
Immature Grans (Abs): 0 10*3/uL (ref 0.0–0.1)
Immature Granulocytes: 0 %
Lymphocytes Absolute: 4.1 10*3/uL — ABNORMAL HIGH (ref 0.7–3.1)
Lymphs: 39 %
MCH: 28.8 pg (ref 26.6–33.0)
MCHC: 32.4 g/dL (ref 31.5–35.7)
MCV: 89 fL (ref 79–97)
Monocytes Absolute: 0.9 10*3/uL (ref 0.1–0.9)
Monocytes: 9 %
Neutrophils Absolute: 4.9 10*3/uL (ref 1.4–7.0)
Neutrophils: 47 %
Platelets: 300 10*3/uL (ref 150–379)
RBC: 5.25 x10E6/uL (ref 4.14–5.80)
RDW: 14.4 % (ref 12.3–15.4)
WBC: 10.3 10*3/uL (ref 3.4–10.8)

## 2016-03-01 LAB — SEDIMENTATION RATE: Sed Rate: 9 mm/hr (ref 0–30)

## 2016-03-01 LAB — C-REACTIVE PROTEIN: CRP: 3.2 mg/L (ref 0.0–4.9)

## 2016-03-05 ENCOUNTER — Telehealth: Payer: Self-pay | Admitting: Family Medicine

## 2016-03-05 NOTE — Telephone Encounter (Signed)
Left message to return call 

## 2016-03-05 NOTE — Telephone Encounter (Signed)
Sleep study shows severe sleep apnea-patient is aware that he has this-it is still necessary to call the patient to let him know the results of the tests he needs CPAP ordered with 15 cm of water pressure.(Apparently his old machine broke and he could not get a new one until he had a new study-this will require new prescriptions sending it to probably what ever into T he once to cover his CPAP machine thank you)

## 2016-03-05 NOTE — Telephone Encounter (Signed)
Sleep study results are ready, 02/20/16 in Notes

## 2016-03-07 ENCOUNTER — Encounter: Payer: Self-pay | Admitting: Internal Medicine

## 2016-03-19 ENCOUNTER — Telehealth: Payer: Self-pay | Admitting: Family Medicine

## 2016-03-19 DIAGNOSIS — M6289 Other specified disorders of muscle: Secondary | ICD-10-CM | POA: Diagnosis not present

## 2016-03-19 DIAGNOSIS — R159 Full incontinence of feces: Secondary | ICD-10-CM | POA: Diagnosis not present

## 2016-03-19 DIAGNOSIS — R150 Incomplete defecation: Secondary | ICD-10-CM | POA: Diagnosis not present

## 2016-03-19 DIAGNOSIS — K582 Mixed irritable bowel syndrome: Secondary | ICD-10-CM | POA: Diagnosis not present

## 2016-03-19 NOTE — Telephone Encounter (Signed)
Patient notified of results of sleep study and would like everything for New CPAP and supplies sent to [facility]. They would like a call to know where they go to pick up machine

## 2016-03-19 NOTE — Telephone Encounter (Signed)
Patient notified of results of sleep study and would like everything for New CPAP and supplies sent to [facility]. They would like a call on where they go to pick new machine and supplies -see other telephone message

## 2016-03-19 NOTE — Telephone Encounter (Signed)
[name] was asked by [name] to call in and find out what needs to be done for him to get his sleep apnea machine.  He has already had his sleep test.  Please advise.

## 2016-03-22 ENCOUNTER — Telehealth: Payer: Self-pay | Admitting: Family Medicine

## 2016-03-22 NOTE — Telephone Encounter (Signed)
done

## 2016-03-22 NOTE — Telephone Encounter (Signed)
ERROR

## 2016-03-22 NOTE — Telephone Encounter (Signed)
Please sign CPAP order in Red folder in your yellow box & forward to me to fax with required documentation

## 2016-03-25 ENCOUNTER — Encounter (HOSPITAL_COMMUNITY): Payer: Self-pay

## 2016-03-25 ENCOUNTER — Emergency Department (HOSPITAL_COMMUNITY)
Admission: EM | Admit: 2016-03-25 | Discharge: 2016-03-25 | Disposition: A | Discharge status: Home / Self Care | Payer: Medicare HMO | Attending: Emergency Medicine | Admitting: Emergency Medicine

## 2016-03-25 ENCOUNTER — Emergency Department (HOSPITAL_COMMUNITY): Payer: Medicare HMO

## 2016-03-25 DIAGNOSIS — I2510 Atherosclerotic heart disease of native coronary artery without angina pectoris: Secondary | ICD-10-CM | POA: Insufficient documentation

## 2016-03-25 DIAGNOSIS — Z7982 Long term (current) use of aspirin: Secondary | ICD-10-CM | POA: Diagnosis not present

## 2016-03-25 DIAGNOSIS — Y929 Unspecified place or not applicable: Secondary | ICD-10-CM | POA: Diagnosis not present

## 2016-03-25 DIAGNOSIS — X501XXA Overexertion from prolonged static or awkward postures, initial encounter: Secondary | ICD-10-CM | POA: Diagnosis not present

## 2016-03-25 DIAGNOSIS — Z79899 Other long term (current) drug therapy: Secondary | ICD-10-CM | POA: Insufficient documentation

## 2016-03-25 DIAGNOSIS — Y9389 Activity, other specified: Secondary | ICD-10-CM | POA: Diagnosis not present

## 2016-03-25 DIAGNOSIS — S8991XA Unspecified injury of right lower leg, initial encounter: Secondary | ICD-10-CM | POA: Diagnosis present

## 2016-03-25 DIAGNOSIS — Y999 Unspecified external cause status: Secondary | ICD-10-CM | POA: Insufficient documentation

## 2016-03-25 DIAGNOSIS — Z87891 Personal history of nicotine dependence: Secondary | ICD-10-CM | POA: Diagnosis not present

## 2016-03-25 DIAGNOSIS — S93401A Sprain of unspecified ligament of right ankle, initial encounter: Secondary | ICD-10-CM | POA: Diagnosis not present

## 2016-03-25 DIAGNOSIS — I10 Essential (primary) hypertension: Secondary | ICD-10-CM | POA: Insufficient documentation

## 2016-03-25 DIAGNOSIS — M25571 Pain in right ankle and joints of right foot: Secondary | ICD-10-CM | POA: Diagnosis not present

## 2016-03-25 DIAGNOSIS — J449 Chronic obstructive pulmonary disease, unspecified: Secondary | ICD-10-CM | POA: Insufficient documentation

## 2016-03-25 MED ORDER — ACETAMINOPHEN 500 MG PO TABS
1000.0000 mg | ORAL_TABLET | Freq: Once | ORAL | Status: AC
  Administered 2016-03-25: 1000 mg via ORAL
  Filled 2016-03-25: qty 2

## 2016-03-25 MED ORDER — HYDROCODONE-ACETAMINOPHEN 5-325 MG PO TABS: 1.0000 | ORAL_TABLET | ORAL | 0 refills | Status: DC | PRN

## 2016-03-25 NOTE — ED Triage Notes (Signed)
Patient reports of getting out of car today and felt right ankle pop. Patient reports unable to walk on right ankle. No obvious deformity noted.

## 2016-03-25 NOTE — ED Provider Notes (Signed)
[facility] DEPT Provider Note   CSN: [id number] Arrival date & time: 03/25/16  1719   By signing my name below, I, Collene Leyden, attest that this documentation has been prepared under the direction and in the presence of Lily Kocher PA-C Electronically Signed: Collene Leyden, Scribe. 03/25/16. 7:27 PM.   History   Chief Complaint Chief Complaint  Patient presents with  . Ankle Pain    HPI Comments: [name] is a 76 y.o. male with a hx of CAD, COPD, DJD, GERD, HTN, and HLD, who presents to the Emergency Department complaining of sudden-onset, constant right ankle pain that began one week ago. Patient reports slipping on leaves and falling, injuring his right ankle one week ago. Patient reprots soaking his foot in water and vinegar. Patient fell once again today, in which he heard his ankle popped. Patient has associated swelling. No modifying factors indicated. Patient is unable ot walk on his right ankle. Patient is not ambulatory in the ED, brought in by wheel chair. Patient denies any other symptoms. Wife requesting a work note.   The history is provided by the patient. No language interpreter was used.    Past Medical History:  Diagnosis Date  . Asthma   . BPH (benign prostatic hyperplasia)   . CAD (coronary artery disease) 01/1995  . Clostridium difficile colitis 04/2005  . Colitis 2011  . COPD (chronic obstructive pulmonary disease) ([place])   . Diverticulosis   . DJD (degenerative joint disease)   . Gastric ulcer 04/17/10   Three 49m gastric ulcers, H.pylori serologies were negative  . GERD (gastroesophageal reflux disease)   . Hyperlipidemia   . Hypertension   . Idiopathic chronic inflammatory bowel disease 05/18/2010   left-sided UC  . Kidney stone   . Obstructive sleep apnea   . Reflux 02/1995  . S/P endoscopy 07/24/10   retained gastric contents, benign bx    Patient Active Problem List   Diagnosis Date Noted  . Hx of skin cancer, basal cell  11/30/2015  . Erectile dysfunction 06/06/2015  . Essential hypertension 01/09/2013  . Hyperlipidemia 01/09/2013  . Chest pain at rest 12/14/2012  . CAD (coronary artery disease), native coronary artery 12/14/2012  . Hiatal hernia 12/14/2012  . GERD (gastroesophageal reflux disease) 12/14/2012  . Prediabetes 12/09/2012  . BPH (benign prostatic hyperplasia) 11/18/2012  . RUQ pain 09/04/2012  . Gallstones 05/09/2012  . CAP (community acquired pneumonia) 05/09/2012  . COPD exacerbation ([place] 04/30/2012  . Obstructive sleep apnea 04/30/2012  . Ulcerative colitis ([place] 04/29/2012  . Ulcerative colitis, left sided ([place] 12/26/2011    Past Surgical History:  Procedure Laterality Date  . CARDIOVASCULAR STRESS TEST  07/21/2009   No scintigraphic evidence of inducible myocardial ischemia  . CERVICAL SPINE SURGERY     C4-5  . COLONOSCOPY  04/2005   granularity and friability erosions from rectum to 40cm. Bx infection vs IBD. C. Diff positive at the time.   . COLONOSCOPY  05/2010   [place]: left-sided UC, bx with no dysplasia, shallow diverticula  . COLONOSCOPY [name] 11/07/2012   RTZG:YFVC-BSWHQproctocolitis status post segmental biopsy/Sigmoid colon polyps removed as described above. Procedure compromisd by technical difficulties. bx: Inflammation limited to sigmoid and rectum on pathology.  . CORONARY STENT PLACEMENT  01/1995  . ESOPHAGOGASTRODUODENOSCOPY  07/24/2010   RPRF:FMBWGYesophagus  . FOOT SURGERY  two  . KNEE SURGERY  two  . SHOULDER SURGERY  two  . TONSILLECTOMY    . TRANSTHORACIC ECHOCARDIOGRAM  03/23/2009  EF 60-65%, normal LV systolic function       Home Medications    Prior to Admission medications   Medication Sig Start Date End Date Taking? Authorizing Provider  amLODipine (NORVASC) 10 MG tablet TAKE ONE TABLET BY MOUTH ONCE DAILY 02/24/16   [name], MD  aspirin 81 MG tablet Take 162 mg by mouth daily.      Historical Provider, MD  ciprofloxacin (CIPRO) 500 MG  tablet Take 1 tablet (500 mg total) by mouth 2 (two) times daily. Patient not taking: Reported on 02/28/2016 02/07/16   [name], MD  losartan (COZAAR) 50 MG tablet TAKE ONE TABLET BY MOUTH ONCE DAILY 12/30/15   [name], MD  losartan (COZAAR) 50 MG tablet TAKE ONE TABLET BY MOUTH ONCE DAILY 01/03/16   [name], MD  metoprolol (LOPRESSOR) 50 MG tablet TAKE ONE TABLET BY MOUTH ONCE DAILY IN THE MORNING AND  2  TABLETS  AT  BEDTIME 02/24/16   [name], MD  metroNIDAZOLE (FLAGYL) 500 MG tablet Take 1 tablet (500 mg total) by mouth 2 (two) times daily with a meal. Patient not taking: Reported on 02/28/2016 02/07/16   [name], MD  nitroGLYCERIN (NITROSTAT) 0.4 MG SL tablet Place 0.4 mg under the tongue every 5 (five) minutes as needed for chest pain.    Historical Provider, MD  pravastatin (PRAVACHOL) 40 MG tablet TAKE ONE TABLET BY MOUTH ONCE DAILY 02/24/16   [name], MD  sildenafil (REVATIO) 20 MG tablet Take 3 to 5 tablets before relations prn 06/06/15   [name], MD    Family History Family History  Problem Relation Age of Onset  . Lung cancer Mother   . Cancer Mother     breast  . Diabetes Mother   . Stroke Father   . Hypertension Father   . Colon cancer Neg Hx     Social History Social History  Substance Use Topics  . Smoking status: Former Research scientist (life sciences)  . Smokeless tobacco: Former Systems developer    Quit date: 01/15/1970  . Alcohol use No     Allergies   Patient has no known allergies.   Review of Systems Review of Systems  Musculoskeletal: Positive for arthralgias (right ankle).  All other systems reviewed and are negative.    Physical Exam Updated Vital Signs BP 155/57 (BP Location: Left Arm)   Pulse 64   Temp 98 F (36.7 C) (Oral)   Resp 22   Ht 6' 1"  (1.854 m)   Wt 291 lb (132 kg)   SpO2 93%   BMI 38.39 kg/m   Physical Exam  Constitutional: He is oriented to person, place, and time. He appears well-developed.  HENT:  Head:  Normocephalic and atraumatic.  Mouth/Throat: Oropharynx is clear and moist.  Eyes: Conjunctivae and EOM are normal. Pupils are equal, round, and reactive to light.  Neck: Normal range of motion. Neck supple.  Cardiovascular: Normal rate.   Pulmonary/Chest: Effort normal.  Abdominal: Soft.  Musculoskeletal: Normal range of motion.  Full range of motion of the toes.  Capillary refill is less than three seconds.  Dorsalis pedis is 2+. Posterior tibial pulse is 2+. Edema and tenderness of the lateral malleolus. Achilles tendon intact. Pain with flexion of the right ankle.   Neurological: He is alert and oriented to person, place, and time.  Skin: Skin is warm and dry.  Psychiatric: He has a normal mood and affect.     ED  Treatments / Results  DIAGNOSTIC STUDIES: Oxygen Saturation is 93% on RA, low by my interpretation.    COORDINATION OF CARE: 7:24 PM Discussed treatment plan with pt at bedside and pt agreed to plan, which includes a post-op shoe and a ASO splint.   Labs (all labs ordered are listed, but only abnormal results are displayed) Labs Reviewed - No data to display  EKG  EKG Interpretation None       Radiology Dg Ankle Complete Right  Result Date: 03/25/2016 CLINICAL DATA:  Twisted ankle.  Medial pain. EXAM: RIGHT ANKLE - COMPLETE 3+ VIEW COMPARISON:  None. FINDINGS: Non traditional positioning. No evidence for an acute fracture. No subluxation or dislocation. Degenerative changes noted at the ankle mortise. IMPRESSION: Negative. Electronically Signed   By: Misty Stanley M.D.   On: 03/25/2016 18:01    Procedures Procedures (including critical care time)  Medications Ordered in ED Medications - No data to display   Initial Impression / Assessment and Plan / ED Course  I have reviewed the triage vital signs and the nursing notes.  Pertinent labs & imaging results that were available during my care of the patient were reviewed by me and considered in my medical  decision making (see chart for details).    Case dicussed with Dr. Stark Jock.   **I have reviewed nursing notes, vital signs, and all appropriate lab and imaging results for this patient.*  Final Clinical Impressions(s) / ED Diagnoses   MDM: Right ankle injury one week ago. Patient felt a pop followed by pain today. X-ray reveals arthritis changes. Patient has a spur on the right calcaneus. No fracture, no dislocation. Patient treated with ASO splint, ice, elevation, post-op shoe, and tylenol extra strength.   Final diagnoses:  Sprain of right ankle, unspecified ligament, initial encounter    New Prescriptions New Prescriptions   No medications on file   **I personally performed the services described in this documentation, which was scribed in my presence. The recorded information has been reviewed and is accurate.Lily Kocher, PA-C 03/26/16 [place], MD 03/26/16 [phone]

## 2016-03-25 NOTE — ED Notes (Signed)
Has been to xray

## 2016-03-25 NOTE — ED Notes (Signed)
HB in to assess

## 2016-03-25 NOTE — Discharge Instructions (Signed)
Your x-ray is negative for fracture or dislocation. Please keep your ankle elevated as much as possible. Apply ice. Use Tylenol Extra Strength for mild pain, use Norco for more severe pain. Norco may cause drowsiness, please use medication with caution. Please use your ankle splint over the next 7-10 days. You do not need to sleep in this device.

## 2016-03-25 NOTE — ED Notes (Signed)
Pt reports that he hurt his R ankle when he slipped in leaves several days ago- then today, he slipped going out of church and heard the ankle pop Now, with swelling  Sensation intact

## 2016-04-02 ENCOUNTER — Other Ambulatory Visit: Payer: Self-pay | Admitting: Family Medicine

## 2016-04-24 DIAGNOSIS — M79671 Pain in right foot: Secondary | ICD-10-CM | POA: Diagnosis not present

## 2016-04-24 DIAGNOSIS — S93411A Sprain of calcaneofibular ligament of right ankle, initial encounter: Secondary | ICD-10-CM | POA: Diagnosis not present

## 2016-05-03 DIAGNOSIS — G4733 Obstructive sleep apnea (adult) (pediatric): Secondary | ICD-10-CM | POA: Diagnosis not present

## 2016-05-10 ENCOUNTER — Encounter: Payer: Self-pay | Admitting: Internal Medicine

## 2016-06-03 DIAGNOSIS — G4733 Obstructive sleep apnea (adult) (pediatric): Secondary | ICD-10-CM | POA: Diagnosis not present

## 2016-06-29 ENCOUNTER — Other Ambulatory Visit: Payer: Self-pay | Admitting: Family Medicine

## 2016-06-29 NOTE — Telephone Encounter (Signed)
Patient may have 30 with one refill needs office visit

## 2016-07-03 DIAGNOSIS — G4733 Obstructive sleep apnea (adult) (pediatric): Secondary | ICD-10-CM | POA: Diagnosis not present

## 2016-08-03 DIAGNOSIS — G4733 Obstructive sleep apnea (adult) (pediatric): Secondary | ICD-10-CM | POA: Diagnosis not present

## 2016-08-06 ENCOUNTER — Encounter: Payer: Self-pay | Admitting: Internal Medicine

## 2016-08-06 ENCOUNTER — Encounter: Payer: Self-pay | Admitting: Family Medicine

## 2016-08-06 ENCOUNTER — Ambulatory Visit (INDEPENDENT_AMBULATORY_CARE_PROVIDER_SITE_OTHER): Payer: Medicare HMO | Admitting: Family Medicine

## 2016-08-06 VITALS — BP 116/70 | Ht 73.0 in | Wt 307.5 lb

## 2016-08-06 DIAGNOSIS — R350 Frequency of micturition: Secondary | ICD-10-CM

## 2016-08-06 DIAGNOSIS — N401 Enlarged prostate with lower urinary tract symptoms: Secondary | ICD-10-CM | POA: Diagnosis not present

## 2016-08-06 DIAGNOSIS — E785 Hyperlipidemia, unspecified: Secondary | ICD-10-CM | POA: Diagnosis not present

## 2016-08-06 DIAGNOSIS — I10 Essential (primary) hypertension: Secondary | ICD-10-CM | POA: Diagnosis not present

## 2016-08-06 DIAGNOSIS — K51519 Left sided colitis with unspecified complications: Secondary | ICD-10-CM

## 2016-08-06 DIAGNOSIS — S0093XD Contusion of unspecified part of head, subsequent encounter: Secondary | ICD-10-CM | POA: Diagnosis not present

## 2016-08-06 DIAGNOSIS — R7303 Prediabetes: Secondary | ICD-10-CM

## 2016-08-06 MED ORDER — METOPROLOL TARTRATE 50 MG PO TABS: ORAL_TABLET | ORAL | 1 refills | Status: DC

## 2016-08-06 MED ORDER — PRAVASTATIN SODIUM 40 MG PO TABS: 40.0000 mg | ORAL_TABLET | Freq: Every day | ORAL | 1 refills | Status: DC

## 2016-08-06 MED ORDER — AMLODIPINE BESYLATE 5 MG PO TABS: 5.0000 mg | ORAL_TABLET | Freq: Every day | ORAL | 1 refills | Status: DC

## 2016-08-06 MED ORDER — LOSARTAN POTASSIUM 50 MG PO TABS: 50.0000 mg | ORAL_TABLET | Freq: Every day | ORAL | 1 refills | Status: DC

## 2016-08-06 NOTE — Patient Instructions (Signed)
Diabetes Mellitus and Food It is important for you to manage your blood sugar (glucose) level. Your blood glucose level can be greatly affected by what you eat. Eating healthier foods in the appropriate amounts throughout the day at about the same time each day will help you control your blood glucose level. It can also help slow or prevent worsening of your diabetes mellitus. Healthy eating may even help you improve the level of your blood pressure and reach or maintain a healthy weight. General recommendations for healthful eating and cooking habits include:  Eating meals and snacks regularly. Avoid going long periods of time without eating to lose weight.  Eating a diet that consists mainly of plant-based foods, such as fruits, vegetables, nuts, legumes, and whole grains.  Using low-heat cooking methods, such as baking, instead of high-heat cooking methods, such as deep frying.  Work with your dietitian to make sure you understand how to use the Nutrition Facts information on food labels. How can food affect me? Carbohydrates Carbohydrates affect your blood glucose level more than any other type of food. Your dietitian will help you determine how many carbohydrates to eat at each meal and teach you how to count carbohydrates. Counting carbohydrates is important to keep your blood glucose at a healthy level, especially if you are using insulin or taking certain medicines for diabetes mellitus. Alcohol Alcohol can cause sudden decreases in blood glucose (hypoglycemia), especially if you use insulin or take certain medicines for diabetes mellitus. Hypoglycemia can be a life-threatening condition. Symptoms of hypoglycemia (sleepiness, dizziness, and disorientation) are similar to symptoms of having too much alcohol. If your health care provider has given you approval to drink alcohol, do so in moderation and use the following guidelines:  Women should not have more than one drink per day, and men  should not have more than two drinks per day. One drink is equal to: ? 12 oz of beer. ? 5 oz of wine. ? 1 oz of hard liquor.  Do not drink on an empty stomach.  Keep yourself hydrated. Have water, diet soda, or unsweetened iced tea.  Regular soda, juice, and other mixers might contain a lot of carbohydrates and should be counted.  What foods are not recommended? As you make food choices, it is important to remember that all foods are not the same. Some foods have fewer nutrients per serving than other foods, even though they might have the same number of calories or carbohydrates. It is difficult to get your body what it needs when you eat foods with fewer nutrients. Examples of foods that you should avoid that are high in calories and carbohydrates but low in nutrients include:  Trans fats (most processed foods list trans fats on the Nutrition Facts label).  Regular soda.  Juice.  Candy.  Sweets, such as cake, pie, doughnuts, and cookies.  Fried foods.  What foods can I eat? Eat nutrient-rich foods, which will nourish your body and keep you healthy. The food you should eat also will depend on several factors, including:  The calories you need.  The medicines you take.  Your weight.  Your blood glucose level.  Your blood pressure level.  Your cholesterol level.  You should eat a variety of foods, including:  Protein. ? Lean cuts of meat. ? Proteins low in saturated fats, such as fish, egg whites, and beans. Avoid processed meats.  Fruits and vegetables. ? Fruits and vegetables that may help control blood glucose levels, such as apples,   mangoes, and yams.  Dairy products. ? Choose fat-free or low-fat dairy products, such as milk, yogurt, and cheese.  Grains, bread, pasta, and rice. ? Choose whole grain products, such as multigrain bread, whole oats, and brown rice. These foods may help control blood pressure.  Fats. ? Foods containing healthful fats, such as  nuts, avocado, olive oil, canola oil, and fish.  Does everyone with diabetes mellitus have the same meal plan? Because every person with diabetes mellitus is different, there is not one meal plan that works for everyone. It is very important that you meet with a dietitian who will help you create a meal plan that is just right for you. This information is not intended to replace advice given to you by your health care provider. Make sure you discuss any questions you have with your health care provider. Document Released: 10/19/2004 Document Revised: 06/30/2015 Document Reviewed: 12/19/2012 Elsevier Interactive Patient Education  2017 [employer] Inc.  

## 2016-08-06 NOTE — Progress Notes (Signed)
   Subjective:    Patient ID: [name], male    DOB: 11-Dec-1940, 76 y.o.   MRN: [id number]  Hypertension  This is a chronic problem. The current episode started more than 1 year ago. Pertinent negatives include no chest pain, headaches or shortness of breath.  Patient is been having swelling in his feet. He does not do much in way walking mainly because his back hurts him a lot he states he does take his blood pressure medicine on a regular basis  Takes his cholesterol medicine previous labs reviewed tolerates medicine well  Patient has chronic back pain and chronic neck pain takes Tylenol for this when necessary.  Patient relates frequent urination denies dribbling. Denies mucus denies fever chills states urinary flow not as good as it should be but is reasonable has history of BPH  Patient has concerns of headache, and blurred vision to left eye and temporal region. He relates he's had several falls over the past few months where he felt dizzy fell struck his had 2 different times relates pain discomfort in the left temporal area with blurred vision states the headache is almost persistent worse at times over the past month he is worried about possibility of a tumor or possibility of bleeding on the brain denies vomiting  Bowel movements issue-has history of colitis has not seen gastroenterology in a while typically has loose stools states he has a hard time affording the higher price medicine  Dizzy with standing-patient relates he gets a little dizzy when he stands up feels slightly off balance denies any other particular underlying troubles  Urinary frequency see above Review of Systems  Constitutional: Negative for activity change, fatigue and fever.  Respiratory: Negative for cough and shortness of breath.   Cardiovascular: Negative for chest pain and leg swelling.  Neurological: Negative for headaches.       Objective:   Physical Exam  Constitutional: He appears  well-nourished. No distress.  Cardiovascular: Normal rate, regular rhythm and normal heart sounds.   No murmur heard. Pulmonary/Chest: Effort normal and breath sounds normal. No respiratory distress.  Musculoskeletal: He exhibits no edema.  Lymphadenopathy:    He has no cervical adenopathy.  Neurological: He is alert.  Psychiatric: His behavior is normal.  Vitals reviewed.         Assessment & Plan:  HTN-his blood pressure is actually somewhat orthostatic lower than what I would expect therefore we will reduce amlodipine new dose 5 mg daily this will probably help the swelling in his feet  Also with colitis fair symptoms follow-up with gastroenterology. They will need to do some further intervention  Hyperlipidemia continue medication. Previous labs reviewed [place]  Urinary from C probably related to BPH will monitor patient's blood pressures on the low when I don't recommend Flomax currently  Significant contusion of the head with subsequent headaches probable concussion need to rule out subdural hematoma need to have CT scan of the head in addition to this tumor or less likely Headaches are new for the patient started occurring after 2 separate falls with significant hits to the head in the left temporal region we will check a sedimentation rate to help rule out temporal arteritis  Follow-up 4 weeks

## 2016-08-07 ENCOUNTER — Telehealth: Payer: Self-pay | Admitting: Family Medicine

## 2016-08-07 DIAGNOSIS — E785 Hyperlipidemia, unspecified: Secondary | ICD-10-CM | POA: Diagnosis not present

## 2016-08-07 DIAGNOSIS — K51519 Left sided colitis with unspecified complications: Secondary | ICD-10-CM | POA: Diagnosis not present

## 2016-08-07 DIAGNOSIS — I10 Essential (primary) hypertension: Secondary | ICD-10-CM | POA: Diagnosis not present

## 2016-08-07 NOTE — Telephone Encounter (Signed)
Prior auth for pt's CT head without contrast was submitted through [facility] note for clinical, is in review, await decision

## 2016-08-08 LAB — HEPATIC FUNCTION PANEL
ALT: 34 IU/L (ref 0–44)
AST: 39 IU/L (ref 0–40)
Albumin: 4 g/dL (ref 3.5–4.8)
Alkaline Phosphatase: 92 IU/L (ref 39–117)
Bilirubin Total: 0.5 mg/dL (ref 0.0–1.2)
Bilirubin, Direct: 0.15 mg/dL (ref 0.00–0.40)
Total Protein: 7.3 g/dL (ref 6.0–8.5)

## 2016-08-08 LAB — BASIC METABOLIC PANEL
BUN/Creatinine Ratio: 11 (ref 10–24)
BUN: 11 mg/dL (ref 8–27)
CO2: 25 mmol/L (ref 20–29)
Calcium: 9.4 mg/dL (ref 8.6–10.2)
Chloride: 104 mmol/L (ref 96–106)
Creatinine, Ser: 1 mg/dL (ref 0.76–1.27)
GFR calc Af Amer: 84 mL/min/{1.73_m2} (ref >59)
GFR calc non Af Amer: 72 mL/min/{1.73_m2} (ref >59)
Glucose: 123 mg/dL — ABNORMAL HIGH (ref 65–99)
Potassium: 4.1 mmol/L (ref 3.5–5.2)
Sodium: 146 mmol/L — ABNORMAL HIGH (ref 134–144)

## 2016-08-08 LAB — LIPID PANEL
Chol/HDL Ratio: 3.5 ratio (ref 0.0–5.0)
Cholesterol, Total: 171 mg/dL (ref 100–199)
HDL: 49 mg/dL (ref >39)
LDL Calculated: 76 mg/dL (ref 0–99)
Triglycerides: 230 mg/dL — ABNORMAL HIGH (ref 0–149)
VLDL Cholesterol Cal: 46 mg/dL — ABNORMAL HIGH (ref 5–40)

## 2016-08-08 LAB — SEDIMENTATION RATE: Sed Rate: 19 mm/hr (ref 0–30)

## 2016-08-13 ENCOUNTER — Encounter: Payer: Self-pay | Admitting: Internal Medicine

## 2016-08-13 DIAGNOSIS — G4733 Obstructive sleep apnea (adult) (pediatric): Secondary | ICD-10-CM | POA: Diagnosis not present

## 2016-08-15 NOTE — Telephone Encounter (Signed)
CT head without contrast scheduled for 07/17/16 was approved  Via [employer]. Authorization number [id number].

## 2016-08-16 ENCOUNTER — Ambulatory Visit (HOSPITAL_COMMUNITY)
Admission: RE | Admit: 2016-08-16 | Discharge: 2016-08-16 | Disposition: A | Discharge status: Home / Self Care | Payer: Medicare HMO | Source: Ambulatory Visit | Attending: Family Medicine | Admitting: Family Medicine

## 2016-08-16 DIAGNOSIS — S0093XD Contusion of unspecified part of head, subsequent encounter: Secondary | ICD-10-CM | POA: Diagnosis not present

## 2016-08-16 DIAGNOSIS — G319 Degenerative disease of nervous system, unspecified: Secondary | ICD-10-CM | POA: Insufficient documentation

## 2016-08-16 DIAGNOSIS — I6782 Cerebral ischemia: Secondary | ICD-10-CM | POA: Insufficient documentation

## 2016-08-16 DIAGNOSIS — X58XXXD Exposure to other specified factors, subsequent encounter: Secondary | ICD-10-CM | POA: Diagnosis not present

## 2016-08-16 DIAGNOSIS — S0990XA Unspecified injury of head, initial encounter: Secondary | ICD-10-CM | POA: Diagnosis not present

## 2016-08-23 ENCOUNTER — Emergency Department (HOSPITAL_COMMUNITY): Payer: Medicare HMO

## 2016-08-23 ENCOUNTER — Other Ambulatory Visit: Payer: Self-pay

## 2016-08-23 ENCOUNTER — Emergency Department (HOSPITAL_COMMUNITY)
Admission: EM | Admit: 2016-08-23 | Discharge: 2016-08-23 | Disposition: A | Discharge status: Home / Self Care | Payer: Medicare HMO | Attending: Emergency Medicine | Admitting: Emergency Medicine

## 2016-08-23 ENCOUNTER — Telehealth: Payer: Self-pay | Admitting: *Deleted

## 2016-08-23 ENCOUNTER — Encounter (HOSPITAL_COMMUNITY): Payer: Self-pay | Admitting: *Deleted

## 2016-08-23 DIAGNOSIS — J449 Chronic obstructive pulmonary disease, unspecified: Secondary | ICD-10-CM | POA: Insufficient documentation

## 2016-08-23 DIAGNOSIS — S20219A Contusion of unspecified front wall of thorax, initial encounter: Secondary | ICD-10-CM

## 2016-08-23 DIAGNOSIS — S299XXA Unspecified injury of thorax, initial encounter: Secondary | ICD-10-CM | POA: Diagnosis not present

## 2016-08-23 DIAGNOSIS — Y9389 Activity, other specified: Secondary | ICD-10-CM | POA: Diagnosis not present

## 2016-08-23 DIAGNOSIS — S52615A Nondisplaced fracture of left ulna styloid process, initial encounter for closed fracture: Secondary | ICD-10-CM

## 2016-08-23 DIAGNOSIS — S52532A Colles' fracture of left radius, initial encounter for closed fracture: Secondary | ICD-10-CM | POA: Insufficient documentation

## 2016-08-23 DIAGNOSIS — S6992XA Unspecified injury of left wrist, hand and finger(s), initial encounter: Secondary | ICD-10-CM | POA: Diagnosis present

## 2016-08-23 DIAGNOSIS — S199XXA Unspecified injury of neck, initial encounter: Secondary | ICD-10-CM | POA: Diagnosis not present

## 2016-08-23 DIAGNOSIS — I2510 Atherosclerotic heart disease of native coronary artery without angina pectoris: Secondary | ICD-10-CM | POA: Diagnosis not present

## 2016-08-23 DIAGNOSIS — W1789XA Other fall from one level to another, initial encounter: Secondary | ICD-10-CM | POA: Diagnosis not present

## 2016-08-23 DIAGNOSIS — Z87891 Personal history of nicotine dependence: Secondary | ICD-10-CM | POA: Diagnosis not present

## 2016-08-23 DIAGNOSIS — S52612A Displaced fracture of left ulna styloid process, initial encounter for closed fracture: Secondary | ICD-10-CM | POA: Diagnosis not present

## 2016-08-23 DIAGNOSIS — S20211A Contusion of right front wall of thorax, initial encounter: Secondary | ICD-10-CM | POA: Diagnosis not present

## 2016-08-23 DIAGNOSIS — Y9289 Other specified places as the place of occurrence of the external cause: Secondary | ICD-10-CM | POA: Insufficient documentation

## 2016-08-23 DIAGNOSIS — Z79899 Other long term (current) drug therapy: Secondary | ICD-10-CM | POA: Diagnosis not present

## 2016-08-23 DIAGNOSIS — Z7982 Long term (current) use of aspirin: Secondary | ICD-10-CM | POA: Diagnosis not present

## 2016-08-23 DIAGNOSIS — R52 Pain, unspecified: Secondary | ICD-10-CM

## 2016-08-23 DIAGNOSIS — S0990XA Unspecified injury of head, initial encounter: Secondary | ICD-10-CM | POA: Diagnosis not present

## 2016-08-23 DIAGNOSIS — Y999 Unspecified external cause status: Secondary | ICD-10-CM | POA: Diagnosis not present

## 2016-08-23 DIAGNOSIS — J45909 Unspecified asthma, uncomplicated: Secondary | ICD-10-CM | POA: Insufficient documentation

## 2016-08-23 DIAGNOSIS — R51 Headache: Secondary | ICD-10-CM | POA: Diagnosis not present

## 2016-08-23 DIAGNOSIS — W19XXXA Unspecified fall, initial encounter: Secondary | ICD-10-CM

## 2016-08-23 DIAGNOSIS — Z7902 Long term (current) use of antithrombotics/antiplatelets: Secondary | ICD-10-CM | POA: Diagnosis not present

## 2016-08-23 DIAGNOSIS — R079 Chest pain, unspecified: Secondary | ICD-10-CM | POA: Diagnosis not present

## 2016-08-23 DIAGNOSIS — I10 Essential (primary) hypertension: Secondary | ICD-10-CM | POA: Diagnosis not present

## 2016-08-23 DIAGNOSIS — S52532S Colles' fracture of left radius, sequela: Secondary | ICD-10-CM | POA: Diagnosis not present

## 2016-08-23 DIAGNOSIS — S52572A Other intraarticular fracture of lower end of left radius, initial encounter for closed fracture: Secondary | ICD-10-CM | POA: Diagnosis not present

## 2016-08-23 MED ORDER — OXYCODONE-ACETAMINOPHEN 5-325 MG PO TABS: 2.0000 | ORAL_TABLET | ORAL | 0 refills | Status: DC | PRN

## 2016-08-23 MED ORDER — OXYCODONE-ACETAMINOPHEN 5-325 MG PO TABS
1.0000 | ORAL_TABLET | Freq: Once | ORAL | Status: AC
  Administered 2016-08-23: 1 via ORAL
  Filled 2016-08-23: qty 1

## 2016-08-23 MED ORDER — OXYCODONE-ACETAMINOPHEN 5-325 MG PO TABS: 1.0000 | ORAL_TABLET | ORAL | 0 refills | Status: DC | PRN

## 2016-08-23 NOTE — ED Triage Notes (Signed)
States he fell off a trailer and injured his left arm, states he felt a pop in his neck and felt a pop in his chest,

## 2016-08-23 NOTE — ED Notes (Signed)
MD Eulis Foster in to assess wrist splint.

## 2016-08-23 NOTE — Discharge Instructions (Signed)
Rest and elevate your left wrist above your heart as much as possible.  Wear the sling, when you are up.  Use ice on the sore area 3 or 4 times a day for 1 hour.  Call the orthopedic doctor in the morning to schedule a time to be seen, tomorrow.  Return here, if needed, for problems.

## 2016-08-23 NOTE — ED Notes (Signed)
c-collar applied  

## 2016-08-23 NOTE — ED Provider Notes (Signed)
[facility] DEPT Provider Note   CSN: [id number] Arrival date & time: 08/23/16  1551     History   Chief Complaint Chief Complaint  Patient presents with  . Fall    HPI [name] is a 76 y.o. male.  Patient was on his trailer when he accidentally fell, earlier today, landing on his back.  He was able to ambulate afterwards and does not feel like he lost consciousness.  He feels like he lost his balance as the reason for the fall.  He complains of pain primarily in the left wrist but also has anterior and lateral chest pain and headache.  He denies recent illnesses including fever, chills, nausea, vomiting, cough, shortness of breath, change in bowel or urinary habits.  There are no other known modifying factors.   HPI  Past Medical History:  Diagnosis Date  . Asthma   . BPH (benign prostatic hyperplasia)   . CAD (coronary artery disease) 01/1995  . Clostridium difficile colitis 04/2005  . Colitis 2011  . COPD (chronic obstructive pulmonary disease) ([place])   . Diverticulosis   . DJD (degenerative joint disease)   . Gastric ulcer 04/17/10   Three 71m gastric ulcers, H.pylori serologies were negative  . GERD (gastroesophageal reflux disease)   . Hyperlipidemia   . Hypertension   . Idiopathic chronic inflammatory bowel disease 05/18/2010   left-sided UC  . Kidney stone   . Obstructive sleep apnea   . Reflux 02/1995  . S/P endoscopy 07/24/10   retained gastric contents, benign bx    Patient Active Problem List   Diagnosis Date Noted  . Hx of skin cancer, basal cell 11/30/2015  . Erectile dysfunction 06/06/2015  . Essential hypertension 01/09/2013  . Hyperlipidemia 01/09/2013  . Chest pain at rest 12/14/2012  . CAD (coronary artery disease), native coronary artery 12/14/2012  . Hiatal hernia 12/14/2012  . GERD (gastroesophageal reflux disease) 12/14/2012  . Prediabetes 12/09/2012  . BPH (benign prostatic hyperplasia) 11/18/2012  . RUQ pain 09/04/2012  .  Gallstones 05/09/2012  . CAP (community acquired pneumonia) 05/09/2012  . COPD exacerbation ([facility] 04/30/2012  . Obstructive sleep apnea 04/30/2012  . Ulcerative colitis ([facility] 04/29/2012  . Ulcerative colitis, left sided ([place] 12/26/2011    Past Surgical History:  Procedure Laterality Date  . CARDIOVASCULAR STRESS TEST  07/21/2009   No scintigraphic evidence of inducible myocardial ischemia  . CERVICAL SPINE SURGERY     C4-5  . COLONOSCOPY  04/2005   granularity and friability erosions from rectum to 40cm. Bx infection vs IBD. C. Diff positive at the time.   . COLONOSCOPY  05/2010   Rourk: left-sided UC, bx with no dysplasia, shallow diverticula  . COLONOSCOPY [name] 11/07/2012   RJJK:[id number]-[id number] status post segmental biopsy/Sigmoid colon polyps removed as described above. Procedure compromisd by technical difficulties. bx: Inflammation limited to sigmoid and rectum on pathology.  . CORONARY STENT PLACEMENT  01/1995  . ESOPHAGOGASTRODUODENOSCOPY  07/24/2010   RBZJ:IRCVELesophagus  . FOOT SURGERY  two  . KNEE SURGERY  two  . SHOULDER SURGERY  two  . TONSILLECTOMY    . TRANSTHORACIC ECHOCARDIOGRAM  03/23/2009   EF 60-65%, normal LV systolic function       Home Medications    Prior to Admission medications   Medication Sig Start Date End Date Taking? Authorizing Provider  amLODipine (NORVASC) 10 MG tablet Take 1 tablet by mouth daily. 05/30/16  Yes [provider]  aspirin 81 MG tablet Take 162 mg by  mouth daily.     Yes [provider]  losartan (COZAAR) 50 MG tablet Take 1 tablet (50 mg total) by mouth daily. 08/06/16  Yes [name], MD  metoprolol tartrate (LOPRESSOR) 50 MG tablet TAKE ONE TABLET BY MOUTH ONCE DAILY IN THE MORNING AND  2  TABLETS  AT  BEDTIME 08/06/16  Yes Luking, Scott A, MD  nitroGLYCERIN (NITROSTAT) 0.4 MG SL tablet Place 0.4 mg under the tongue every 5 (five) minutes as needed for chest pain.   Yes [provider]    pravastatin (PRAVACHOL) 40 MG tablet Take 1 tablet (40 mg total) by mouth daily. 08/06/16  Yes [name], MD  oxyCODONE-acetaminophen (PERCOCET/ROXICET) 5-325 MG tablet Take 2 tablets by mouth every 4 (four) hours as needed for severe pain. 08/23/16   Daleen Bo, MD  oxyCODONE-acetaminophen (PERCOCET/ROXICET) 5-325 MG tablet Take 1-2 tablets by mouth every 4 (four) hours as needed for severe pain. 08/23/16   Daleen Bo, MD  sildenafil (REVATIO) 20 MG tablet Take 3 to 5 tablets before relations prn Patient not taking: Reported on 08/06/2016 06/06/15   [name], MD    Family History Family History  Problem Relation Age of Onset  . Lung cancer Mother   . Cancer Mother        breast  . Diabetes Mother   . Stroke Father   . Hypertension Father   . Colon cancer Neg Hx     Social History Social History  Substance Use Topics  . Smoking status: Former Research scientist (life sciences)  . Smokeless tobacco: Former Systems developer    Quit date: 01/15/1970  . Alcohol use No     Allergies   Patient has no known allergies.   Review of Systems Review of Systems  All other systems reviewed and are negative.    Physical Exam Updated Vital Signs BP 133/69   Pulse 69   Temp (!) 97.5 F (36.4 C) (Oral)   Resp (!) 22   SpO2 94%   Physical Exam  Constitutional: He is oriented to person, place, and time. He appears well-developed. No distress.  Morbidly obese  HENT:  Head: Normocephalic and atraumatic.  Right Ear: External ear normal.  Left Ear: External ear normal.  Eyes: Pupils are equal, round, and reactive to light. Conjunctivae and EOM are normal.  Neck: Normal range of motion and phonation normal. Neck supple.  Cardiovascular: Normal rate, regular rhythm and normal heart sounds.   Pulmonary/Chest: Effort normal and breath sounds normal. He exhibits tenderness (Diffuse anterior tenderness without crepitation or deformity.  No rib instability.). He exhibits no bony tenderness.  Abdominal: Soft.  There is no tenderness.  Musculoskeletal: Normal range of motion.  Left wrist tender and swollen without significant deformity.  Remainder of arms and legs, without palpable tenderness or deformity.  Neurovascular intact distally in the left hand.  Neurological: He is alert and oriented to person, place, and time. No cranial nerve deficit or sensory deficit. He exhibits normal muscle tone. Coordination normal.  Skin: Skin is warm, dry and intact.  Psychiatric: He has a normal mood and affect. His behavior is normal. Judgment and thought content normal.  Nursing note and vitals reviewed.    ED Treatments / Results  Labs (all labs ordered are listed, but only abnormal results are displayed) Labs Reviewed - No data to display  EKG  EKG Interpretation  Date/Time:  Thursday August 23 2016 18:20:22 EDT Ventricular Rate:  61 PR Interval:    QRS Duration:  105 QT Interval:  437 QTC Calculation: 441 R Axis:   -14 Text Interpretation:  Sinus rhythm Prolonged PR interval Abnormal R-wave progression, early transition Abnormal T, consider ischemia, lateral leads since last tracing no significant change Confirmed by Daleen Bo [phone]) on 08/23/2016 6:47:02 PM       Radiology Dg Chest 2 View  Result Date: 08/23/2016 CLINICAL DATA:  Fall.  History of asthma/COPD. EXAM: CHEST  2 VIEW COMPARISON:  10/21/2013. FINDINGS: Mediastinum hilar structures normal. Cardiomegaly with normal pulmonary vascularity. COPD. Bilateral pleural thickening again noted consistent with scarring. Costophrenic angles are not completely imaged. Degenerative changes thoracic spine. No acute bony abnormality identified. IMPRESSION: 1. Stable cardiomegaly. 2. COPD. Bilateral pleural thickening consistent scarring. No acute cardiopulmonary disease identified. Electronically Signed   By: Marcello Moores  Register   On: 08/23/2016 17:00   Dg Ribs Bilateral  Result Date: 08/23/2016 CLINICAL DATA:  76 year old who fell backwards off of a  trailer onto his back and complains of chest pain. Initial encounter. EXAM: BILATERAL RIBS - 3+ VIEW COMPARISON:  Chest x-ray earlier today, 10/21/2013 and previously. CT chest 10/21/2013. FINDINGS: The site of maximum pain and tenderness was marked with a metallic BB, and this corresponds to the junction of the costal cartilages with the lower sternum. No fractures identified involving the right or left ribs. Extensive costal cartilage calcification is present bilaterally. The costal cartilage calcification appears intact in the area of maximum pain. IMPRESSION: No fractures identified involving the right or left ribs. Electronically Signed   By: Evangeline Dakin M.D.   On: 08/23/2016 19:43   Dg Cervical Spine Complete  Result Date: 08/23/2016 CLINICAL DATA:  Pt States he fell off a trailer and fell backwards onto back. Prior history of C4-5 surgery. Pt had limited range of motion up and down or left to right. Pt unable to stand to finish exam, put patient on steady seat EXAM: CERVICAL SPINE - COMPLETE 4+ VIEW COMPARISON:  03/26/2011 FINDINGS: No fracture or spondylolisthesis. Status post anterior cervical spine fusion at C4 and C5. The orthopedic hardware appears well-seated with no evidence of loosening. Mature bone graft material spans the disc interspace. There is mild loss disc height at C3-C4. Bridging osteophytes extend from C3-C4 with a thicker bridging osteophyte noted from C5-C6. Bones are diffusely demineralized. Soft tissues are unremarkable other than right carotid region vascular calcifications. IMPRESSION: 1. No fracture or spondylolisthesis.  No acute finding. 2. Mature fusion at C4-C5. No evidence of loosening of the orthopedic hardware. Electronically Signed   By: Lajean Manes M.D.   On: 08/23/2016 17:00   Dg Wrist Complete Left  Result Date: 08/23/2016 CLINICAL DATA:  Generalized LEFT wrist pain, fell off a trailer backwards onto back EXAM: LEFT WRIST - COMPLETE 3+ VIEW COMPARISON:   12/31/2012 FINDINGS: Obliquity limits lateral view. Osseous demineralization. Joint spaces preserved. Minimally displaced ulnar styloid fracture. Comminuted mildly displaced distal LEFT radial metaphyseal fracture with apex volar angulation and intra-articular extension. No additional fracture, dislocation, or bone destruction. IMPRESSION: Comminuted displaced and angulated intra-articular fracture of the distal LEFT radial metaphysis. Minimally displaced ulnar styloid fracture. Electronically Signed   By: Lavonia Dana M.D.   On: 08/23/2016 16:58   Ct Head Wo Contrast  Result Date: 08/23/2016 CLINICAL DATA:  Patient reports falling out of a trailer injuring the left arm with also having a pop in the neck and a pop in the chest. EXAM: CT HEAD WITHOUT CONTRAST CT CERVICAL SPINE WITHOUT CONTRAST TECHNIQUE: Multidetector CT imaging of the head  and cervical spine was performed following the standard protocol without intravenous contrast. Multiplanar CT image reconstructions of the cervical spine were also generated. COMPARISON:  08/16/2016 FINDINGS: CT HEAD FINDINGS Brain: No evidence of acute infarction, hemorrhage, hydrocephalus, extra-axial collection or mass lesion/mass effect. Vascular: No hyperdense vessel or unexpected calcification. Skull: Normal. Negative for fracture or focal lesion. Sinuses/Orbits: Globes orbits are unremarkable. There is chronic opacification of the left maxillary sinus. Remaining visualized sinuses are clear as are the mastoid air cells. Other: None. CT CERVICAL SPINE FINDINGS Alignment: Normal. Skull base and vertebrae: No acute fracture. No primary bone lesion or focal pathologic process. Soft tissues and spinal canal: No prevertebral fluid or swelling. No visible canal hematoma. Disc levels: There has been a previous anterior cervical spine fusion at C4 and C5. The orthopedic hardware is well-seated with no evidence of loosening. Mature bone extends across the disc interspace. There is  also apparent partial bony fusion across the C5-C6 and C6-C7 levels. There is loss of disc height with endplate osteophytes, uncovertebral spurring and facet degenerative change bilaterally with varying degrees of neural foraminal narrowing. No convincing disc herniation. Upper chest: There is upper lobe scarring and paraseptal emphysema. No acute findings. Other: None. IMPRESSION: HEAD CT:  No acute intracranial abnormalities.  No skull fracture. CERVICAL CT:  No fracture or acute finding. Electronically Signed   By: Lajean Manes M.D.   On: 08/23/2016 19:20   Ct Cervical Spine Wo Contrast  Result Date: 08/23/2016 CLINICAL DATA:  Patient reports falling out of a trailer injuring the left arm with also having a pop in the neck and a pop in the chest. EXAM: CT HEAD WITHOUT CONTRAST CT CERVICAL SPINE WITHOUT CONTRAST TECHNIQUE: Multidetector CT imaging of the head and cervical spine was performed following the standard protocol without intravenous contrast. Multiplanar CT image reconstructions of the cervical spine were also generated. COMPARISON:  08/16/2016 FINDINGS: CT HEAD FINDINGS Brain: No evidence of acute infarction, hemorrhage, hydrocephalus, extra-axial collection or mass lesion/mass effect. Vascular: No hyperdense vessel or unexpected calcification. Skull: Normal. Negative for fracture or focal lesion. Sinuses/Orbits: Globes orbits are unremarkable. There is chronic opacification of the left maxillary sinus. Remaining visualized sinuses are clear as are the mastoid air cells. Other: None. CT CERVICAL SPINE FINDINGS Alignment: Normal. Skull base and vertebrae: No acute fracture. No primary bone lesion or focal pathologic process. Soft tissues and spinal canal: No prevertebral fluid or swelling. No visible canal hematoma. Disc levels: There has been a previous anterior cervical spine fusion at C4 and C5. The orthopedic hardware is well-seated with no evidence of loosening. Mature bone extends across the  disc interspace. There is also apparent partial bony fusion across the C5-C6 and C6-C7 levels. There is loss of disc height with endplate osteophytes, uncovertebral spurring and facet degenerative change bilaterally with varying degrees of neural foraminal narrowing. No convincing disc herniation. Upper chest: There is upper lobe scarring and paraseptal emphysema. No acute findings. Other: None. IMPRESSION: HEAD CT:  No acute intracranial abnormalities.  No skull fracture. CERVICAL CT:  No fracture or acute finding. Electronically Signed   By: Lajean Manes M.D.   On: 08/23/2016 19:20    Procedures .Splint Application Date/Time: 5/70/1779 8:13 PM Performed by: Daleen Bo Authorized by: Daleen Bo   Consent:    Consent obtained:  Verbal   Consent given by:  Patient   Risks discussed:  Discoloration, pain and swelling   Alternatives discussed:  No treatment Pre-procedure details:    Sensation:  Normal Procedure details:    Laterality:  Left   Location:  Wrist   Wrist:  L wrist   Strapping: no     Cast type:  Short arm   Splint type:  Sugar tong   Supplies:  Cotton padding, Ortho-Glass and sling Post-procedure details:    Pain:  Improved   Sensation:  Normal   Skin color:  Pink   Patient tolerance of procedure:  Tolerated well, no immediate complications   (including critical care time)  Medications Ordered in ED Medications  oxyCODONE-acetaminophen (PERCOCET/ROXICET) 5-325 MG per tablet 1 tablet (1 tablet Oral Given 08/23/16 1842)     Initial Impression / Assessment and Plan / ED Course  I have reviewed the triage vital signs and the nursing notes.  Pertinent labs & imaging results that were available during my care of the patient were reviewed by me and considered in my medical decision making (see chart for details).  Clinical Course as of Aug 24 2011  Thu Aug 23, 2016  1829 DG Cervical Spine Complete [EW]    Clinical Course User Index [EW] Daleen Bo, MD      Patient Vitals for the past 24 hrs:  BP Temp Temp src Pulse Resp SpO2  08/23/16 1900 133/69 - - 69 (!) 22 94 %  08/23/16 1823 (!) 152/67 - - 62 18 93 %  08/23/16 1555 (!) 150/61 (!) 97.5 F (36.4 C) Oral 72 19 95 %    19: 15-case discussed with orthopedics who recommends splint and follow-up with him for determination on ongoing management plan.  8:13 PM Reevaluation with update and discussion. After initial assessment and treatment, an updated evaluation reveals he is feeling better now with the splint on, findings discussed with patient and family members, all questions were answered. Morghan Kester L    Final Clinical Impressions(s) / ED Diagnoses   Final diagnoses:  Pain  Fall, initial encounter  Colles' fracture of left radius, sequela  Closed nondisplaced fracture of styloid process of left ulna, initial encounter  Contusion of chest wall, unspecified laterality, initial encounter    Chemical fall with multiple contusions and fractured left wrist.  Mild head injury without signs of significant intracranial abnormality.  Cervical spine pain, with prior spine fusion, but no sign for fracture or dislocation.  Doubt serious bacterial infection, metabolic instability or impending vascular collapse.  Nursing Notes Reviewed/ Care Coordinated Applicable Imaging Reviewed Interpretation of Laboratory Data incorporated into ED treatment  The patient appears reasonably screened and/or stabilized for discharge and I doubt any other medical condition or other [facility] requiring further screening, evaluation, or treatment in the ED at this time prior to discharge.  Plan: Home Medications-continue usual medications; Home Treatments-cryotherapy, sling, elevation; return here if the recommended treatment, does not improve the symptoms; Recommended follow up-call orthopedics in the morning to arrange to be seen tomorrow.  Follow-up with PCP or return here as needed.    New Prescriptions New  Prescriptions   OXYCODONE-ACETAMINOPHEN (PERCOCET/ROXICET) 5-325 MG TABLET    Take 2 tablets by mouth every 4 (four) hours as needed for severe pain.   OXYCODONE-ACETAMINOPHEN (PERCOCET/ROXICET) 5-325 MG TABLET    Take 1-2 tablets by mouth every 4 (four) hours as needed for severe pain.     Daleen Bo, MD 08/23/16 2016

## 2016-08-23 NOTE — ED Notes (Signed)
Pt given pre-pack, d/c instructions, and follow up care. Denies further questions or concerns at this time. Wheeled to car with family for d/c.

## 2016-08-23 NOTE — Telephone Encounter (Signed)
Left message to return call 

## 2016-08-23 NOTE — Telephone Encounter (Signed)
Patient's daughter [name] called wanting to speak to the nurse, regarding patient's falls. Patient fell today and she is taking him to the ER. [phone]

## 2016-08-24 ENCOUNTER — Ambulatory Visit (INDEPENDENT_AMBULATORY_CARE_PROVIDER_SITE_OTHER): Payer: Medicare HMO | Admitting: Orthopedic Surgery

## 2016-08-24 ENCOUNTER — Encounter: Payer: Self-pay | Admitting: Orthopedic Surgery

## 2016-08-24 VITALS — BP 130/65 | HR 75 | Temp 97.5°F | Ht 73.0 in | Wt 305.0 lb

## 2016-08-24 DIAGNOSIS — S52532A Colles' fracture of left radius, initial encounter for closed fracture: Secondary | ICD-10-CM | POA: Diagnosis not present

## 2016-08-24 NOTE — Progress Notes (Signed)
Patient ID: [name], male   DOB: September 30, 1940, 76 y.o.   MRN: [id number]  Chief Complaint  Patient presents with  . New Patient (Initial Visit)    [place] 08/23/16 left wrist fracture    HPI [name] is a 76 y.o. male.   76 year old male fell off of a trailer   He presents with a one-day history of moderate flow constant pain over his left distal radius. His x-ray shows a minimally angulated nondisplaced left distal radius fracture    Review of Systems Review of Systems  Respiratory: Negative for shortness of breath.   Cardiovascular: Negative for chest pain.     Past Medical History:  Diagnosis Date  . Asthma   . BPH (benign prostatic hyperplasia)   . CAD (coronary artery disease) 01/1995  . Clostridium difficile colitis 04/2005  . Colitis 2011  . COPD (chronic obstructive pulmonary disease) ([place])   . Diverticulosis   . DJD (degenerative joint disease)   . Gastric ulcer 04/17/10   Three 9m gastric ulcers, H.pylori serologies were negative  . GERD (gastroesophageal reflux disease)   . Hyperlipidemia   . Hypertension   . Idiopathic chronic inflammatory bowel disease 05/18/2010   left-sided UC  . Kidney stone   . Obstructive sleep apnea   . Reflux 02/1995  . S/P endoscopy 07/24/10   retained gastric contents, benign bx    Past Surgical History:  Procedure Laterality Date  . CARDIOVASCULAR STRESS TEST  07/21/2009   No scintigraphic evidence of inducible myocardial ischemia  . CERVICAL SPINE SURGERY     C4-5  . COLONOSCOPY  04/2005   granularity and friability erosions from rectum to 40cm. Bx infection vs IBD. C. Diff positive at the time.   . COLONOSCOPY  05/2010   Rourk: left-sided UC, bx with no dysplasia, shallow diverticula  . COLONOSCOPY [name] 11/07/2012   RRJJ:[id number] status post segmental biopsy/Sigmoid colon polyps removed as described above. Procedure compromisd by technical difficulties. bx: Inflammation limited to sigmoid and rectum  on pathology.  . CORONARY STENT PLACEMENT  01/1995  . ESOPHAGOGASTRODUODENOSCOPY  07/24/2010   RKZS:WFUXNAesophagus  . FOOT SURGERY  two  . KNEE SURGERY  two  . SHOULDER SURGERY  two  . TONSILLECTOMY    . TRANSTHORACIC ECHOCARDIOGRAM  03/23/2009   EF 60-65%, normal LV systolic function      Physical Exam 1 There were no vitals taken for this visit. Physical Exam 2 The patient is well developed well nourished and well groomed. 3 Orientation to person place and time is normal  4 Mood is pleasant.  5 Ambulatory status Normal  6 Inspection of the left wrist reveals mild tenderness   mild swelling minimal deformity 7 Range of motion assessment: The range of motion is diminished primarily secondary to pain 8 Stability tests are deferred because of pain but the x-ray shows no subluxation of the joint 9 Strength assessment muscle tone is normal resistance testing is deferred because of pain and swelling  10 Nerve function normal sensation median ulnar radial nerve 11 Vascular function normal radial artery pulse and color and capillary refill 12 Local lymphatic system negative epitrochlear region  Opposite upper extremity (right) there is no alignment abnormality, no contracture, no subluxation, no atrophy and neurovascular exam is intact  Data Reviewed X-rays I've independently interpreted the x-ray as follows  3 views of the left wrist: Nondisplaced minimally angulated distal radius fracture left wrist  Assessment    Left Wrist fracture  Plan    [facility] LEFT WRIST

## 2016-08-24 NOTE — Patient Instructions (Addendum)
Colles Fracture Colles fracture is a type of broken wrist. It means that your radius bone is broken or cracked. The radius is the bone of your forearm on the same side as your thumb. The forearm is the part of your arm that is between your elbow and your wrist. Your forearm is made up of two bones. The other bone is called the ulna. Often, when the radius is broken, the ulna may also be broken. A cast or splint is used to protect and prevent your injured bone from moving as it heals. What are the causes? Common causes of this type of fracture include:  A hard, direct hit to the wrist.  Falling on an outstretched hand.  Trauma, such as a car accident or a fall.  What increases the risk? You may be at higher risk for this type of fracture if:  You participate in contact sports and high-risk sports, such as skiing, biking, and ice skating.  You smoke.  You drink more than three alcoholic beverages per day.  You have low or lowered bone density (osteoporosis or osteopenia).  You are a young child or an older adult.  You are a woman who has gone through menopause.  You have a history of previous fractures.  You are not getting enough calcium or vitamin D.  What are the signs or symptoms? Symptoms of Colles fracture may include tenderness, bruising, and inflammation. Additionally, your wrist may hang in an odd position, appear deformed, and be difficult to move. How is this diagnosed? Diagnosis may include:  Physical exam.  X-ray.  How is this treated? Treatment depends on many factors, including the severity of the fracture, your age, and your activity level. Treatment for Colles fracture can be nonsurgical or surgical. Nonsurgical Treatment A cast or a splint may be applied to your wrist if the bone is in a good position. If the fracture is not in a good position, it may be necessary for your health care provider to realign it before applying a cast or a splint. Usually, a cast  or a splint will be worn for several weeks. Surgical Treatment Sometimes, if the fractured bone is severely displaced, surgery is required to help hold it together as it heals. Depending on the fracture, there are a number of options for holding the bone in place while it heals, such as a cast and metal pins. Follow these instructions at home: If you have a cast:  Do not stick anything inside the cast to scratch your skin. Doing that increases your risk of infection.  Check the skin around the cast every day. Report any concerns to your health care provider. You may put lotion on dry skin around the edges of the cast. Do not apply lotion to the skin underneath the cast. If you have a splint:  Wear it as directed by your health care provider. Remove it only as directed by your health care provider.  Loosen the splint if your fingers become numb and tingle, or if they turn cold and blue. Bathing  Cover the cast or splint with a watertight plastic bag to protect it from water while you bathe or shower. Do not let the cast or splint get wet. Managing pain, stiffness, and swelling  If directed, apply ice to the injured area: ? Put ice in a plastic bag. ? Place a towel between your skin and the bag. ? Leave the ice on for 20 minutes, 2-3 times a day.  Move  your fingers often to avoid stiffness and to lessen swelling.  Raise the injured area above the level of your heart while you are sitting or lying down. Driving  Do not drive or operate heavy machinery while taking pain medicine.  Do not drive while wearing a cast or splint on a hand that you use for driving. Activity  Return to your normal activities as directed by your health care provider. Ask your health care provider what activities are safe for you.  Perform range-of-motion exercises only as directed by your health care provider. Safety  Do not use your injured limb to support your body weight until your health care provider  says that you can. General instructions  Do not put pressure on any part of the cast or splint until it is fully hardened. This may take several hours.  Keep the cast or splint clean and dry.  Do not use any tobacco products, including cigarettes, chewing tobacco, or electronic cigarettes. Tobacco can delay bone healing. If you need help quitting, ask your health care provider.  Take medicines only as directed by your health care provider.  Keep all follow-up visits as directed by your health care provider. This is important. Contact a health care provider if:  Your cast or splint becomes wet or damaged or suddenly feels too tight.  You have a fever.  You have chills.  You have continued severe pain or more swelling than you did before the cast or splint was put on your wrist. Get help right away if:  Your hand or fingernails on the injured arm turn blue or gray, or they feel cold or numb.  You have decreased feeling in the fingers of your injured arm. This information is not intended to replace advice given to you by your health care provider. Make sure you discuss any questions you have with your health care provider. Document Released: 02/07/2006 Document Revised: 06/30/2015 Document Reviewed: 09/07/2013 Elsevier Interactive Patient Education  2018 [employer] or Splint Care, Adult Casts and splints are supports that are worn to protect broken bones and other injuries. A cast or splint may hold a bone still and in the correct position while it heals. Casts and splints may also help to ease pain, swelling, and muscle spasms. How to care for your cast  Do not stick anything inside the cast to scratch your skin.  Check the skin around the cast every day. Tell your doctor about any concerns.  You may put lotion on dry skin around the edges of the cast. Do not put lotion on the skin under the cast.  Keep the cast clean.  If the cast is not waterproof: ? Do not let it  get wet. ? Cover it with a watertight covering when you take a bath or a shower. How to care for your splint  Wear it as told by your doctor. Take it off only as told by your doctor.  Loosen the splint if your fingers or toes tingle, get numb, or turn cold and blue.  Keep the splint clean.  If the splint is not waterproof: ? Do not let it get wet. ? Cover it with a watertight covering when you take a bath or a shower. Follow these instructions at home: Bathing  Do not take baths or swim until your doctor says it is okay. Ask your doctor if you can take showers. You may only be allowed to take sponge baths for bathing.  If your  cast or splint is not waterproof, cover it with a watertight covering when you take a bath or shower. Managing pain, stiffness, and swelling  Move your fingers or toes often to avoid stiffness and to lessen swelling.  Raise (elevate) the injured area above the level of your heart while sitting or lying down. Safety  Do not use the injured limb to support your body weight until your doctor says that it is okay.  Use crutches or other assistive devices as told by your doctor. General instructions  Do not put pressure on any part of the cast or splint until it is fully hardened. This may take many hours.  Return to your normal activities as told by your doctor. Ask your doctor what activities are safe for you.  Keep all follow-up visits as told by your doctor. This is important. Contact a doctor if:  Your cast or splint gets damaged.  The skin around the cast gets red or raw.  The skin under the cast is very itchy or painful.  Your cast or splint feels very uncomfortable.  Your cast or splint is too tight or too loose.  Your cast becomes wet or it starts to have a soft spot or area.  You get an object stuck under your cast. Get help right away if:  Your pain gets worse.  The injured area tingles, gets numb, or turns blue and cold.  The part  of your body above or below the cast is swollen and it turns a different color (is discolored).  You cannot feel or move your fingers or toes.  There is fluid leaking through the cast.  You have very bad pain or pressure under the cast.  You have trouble breathing.  You have shortness of breath.  You have chest pain. This information is not intended to replace advice given to you by your health care provider. Make sure you discuss any questions you have with your health care provider. Document Released: 05/24/2010 Document Revised: 01/13/2016 Document Reviewed: 01/13/2016 Elsevier Interactive Patient Education  2017 [employer].

## 2016-08-29 NOTE — Telephone Encounter (Signed)
If family calls back we will be more than happy to see this patient sooner than August 6, he could be worked in later this week or next week

## 2016-08-29 NOTE — Telephone Encounter (Signed)
Patient has follow up with Dr Nicki Reaper 09/10/16 (hospital follow up)

## 2016-08-30 MED FILL — Oxycodone w/ Acetaminophen Tab 5-325 MG: ORAL | Qty: 6 | Status: AC

## 2016-09-02 DIAGNOSIS — G4733 Obstructive sleep apnea (adult) (pediatric): Secondary | ICD-10-CM | POA: Diagnosis not present

## 2016-09-03 ENCOUNTER — Ambulatory Visit: Payer: Medicare HMO | Admitting: Nutrition

## 2016-09-10 ENCOUNTER — Ambulatory Visit (INDEPENDENT_AMBULATORY_CARE_PROVIDER_SITE_OTHER): Payer: Medicare HMO | Admitting: Family Medicine

## 2016-09-10 ENCOUNTER — Encounter: Payer: Self-pay | Admitting: Family Medicine

## 2016-09-10 VITALS — BP 136/86 | Ht 73.0 in | Wt 300.8 lb

## 2016-09-10 DIAGNOSIS — E785 Hyperlipidemia, unspecified: Secondary | ICD-10-CM

## 2016-09-10 DIAGNOSIS — I10 Essential (primary) hypertension: Secondary | ICD-10-CM | POA: Diagnosis not present

## 2016-09-10 DIAGNOSIS — R270 Ataxia, unspecified: Secondary | ICD-10-CM | POA: Diagnosis not present

## 2016-09-10 MED ORDER — LOSARTAN POTASSIUM 25 MG PO TABS: 25.0000 mg | ORAL_TABLET | Freq: Every day | ORAL | 5 refills | Status: DC

## 2016-09-10 MED ORDER — AMLODIPINE BESYLATE 5 MG PO TABS: 5.0000 mg | ORAL_TABLET | Freq: Every day | ORAL | 5 refills | Status: DC

## 2016-09-10 NOTE — Progress Notes (Signed)
   Subjective:    Patient ID: [name], male    DOB: 10-20-1940, 76 y.o.   MRN: [id number]  HPI  Patient arrives for a follow up on blood pressure. Patient fell and broke arm a few weeks ago. Patient fell broke his wrist where the cast under the care of Dr. Aline Brochure takes pain medicine occasionally in the evening He is had several falls in the past year. Finds himself feeling woozy dizzy when he stands up. Has difficult time moving around. Still cares for himself. Denies any chest tightness pressure pain shortness breath Has a history of COPD heart disease hypertension sleep apnea morbid obesity in ataxia Review of Systems  Constitutional: Negative for activity change, appetite change and fatigue.  HENT: Negative for congestion.   Respiratory: Negative for cough.   Cardiovascular: Negative for chest pain.  Gastrointestinal: Negative for abdominal pain.  Endocrine: Negative for polydipsia and polyphagia.  Neurological: Negative for weakness.  Psychiatric/Behavioral: Negative for confusion.       Objective:   Physical Exam  Constitutional: He appears well-nourished. No distress.  Cardiovascular: Normal rate, regular rhythm and normal heart sounds.   No murmur heard. Pulmonary/Chest: Effort normal and breath sounds normal. No respiratory distress.  Musculoskeletal: He exhibits no edema.  Lymphadenopathy:    He has no cervical adenopathy.  Neurological: He is alert.  Psychiatric: His behavior is normal.  Vitals reviewed.  Blood pressure sitting standing 110/70 sitting 120/76 standing extremities no edema moderate ataxia when he walks  25 minutes was spent with the patient. Greater than half the time was spent in discussion and answering questions and counseling regarding the issues that the patient came in for today.      Assessment & Plan:  HTN I believe his medications are too strong for him I recommend reducing this to 25 mg losartan also reduce amlodipine to 5 mg  daily recheck him again in 6 weeks time to check for orthostatics new  Hyperlipidemia previous labs reviewed continue cholesterol medicine  Significant ataxia walk with a cane physical therapy would be beneficial  Morbid obesity try to lose weight would be beneficial  Face-to-face evaluation was done for the purpose of a home health evaluation this patient would benefit from having his blood pressure monitored plus also inspect the home for any trip hazards plus also possibly help get him the aid to help with the home

## 2016-09-13 DIAGNOSIS — R296 Repeated falls: Secondary | ICD-10-CM | POA: Diagnosis not present

## 2016-09-13 DIAGNOSIS — J449 Chronic obstructive pulmonary disease, unspecified: Secondary | ICD-10-CM | POA: Diagnosis not present

## 2016-09-13 DIAGNOSIS — S6292XD Unspecified fracture of left wrist and hand, subsequent encounter for fracture with routine healing: Secondary | ICD-10-CM | POA: Diagnosis not present

## 2016-09-13 DIAGNOSIS — I2510 Atherosclerotic heart disease of native coronary artery without angina pectoris: Secondary | ICD-10-CM | POA: Diagnosis not present

## 2016-09-13 DIAGNOSIS — I10 Essential (primary) hypertension: Secondary | ICD-10-CM | POA: Diagnosis not present

## 2016-09-14 DIAGNOSIS — J449 Chronic obstructive pulmonary disease, unspecified: Secondary | ICD-10-CM | POA: Diagnosis not present

## 2016-09-14 DIAGNOSIS — S6292XD Unspecified fracture of left wrist and hand, subsequent encounter for fracture with routine healing: Secondary | ICD-10-CM | POA: Diagnosis not present

## 2016-09-14 DIAGNOSIS — R296 Repeated falls: Secondary | ICD-10-CM | POA: Diagnosis not present

## 2016-09-14 DIAGNOSIS — I10 Essential (primary) hypertension: Secondary | ICD-10-CM | POA: Diagnosis not present

## 2016-09-14 DIAGNOSIS — I2510 Atherosclerotic heart disease of native coronary artery without angina pectoris: Secondary | ICD-10-CM | POA: Diagnosis not present

## 2016-09-17 DIAGNOSIS — I10 Essential (primary) hypertension: Secondary | ICD-10-CM | POA: Diagnosis not present

## 2016-09-17 DIAGNOSIS — I2510 Atherosclerotic heart disease of native coronary artery without angina pectoris: Secondary | ICD-10-CM | POA: Diagnosis not present

## 2016-09-17 DIAGNOSIS — S6292XD Unspecified fracture of left wrist and hand, subsequent encounter for fracture with routine healing: Secondary | ICD-10-CM | POA: Diagnosis not present

## 2016-09-17 DIAGNOSIS — J449 Chronic obstructive pulmonary disease, unspecified: Secondary | ICD-10-CM | POA: Diagnosis not present

## 2016-09-17 DIAGNOSIS — R296 Repeated falls: Secondary | ICD-10-CM | POA: Diagnosis not present

## 2016-09-20 ENCOUNTER — Other Ambulatory Visit: Payer: Self-pay | Admitting: Radiology

## 2016-09-20 DIAGNOSIS — I10 Essential (primary) hypertension: Secondary | ICD-10-CM | POA: Diagnosis not present

## 2016-09-20 DIAGNOSIS — R296 Repeated falls: Secondary | ICD-10-CM | POA: Diagnosis not present

## 2016-09-20 DIAGNOSIS — S52532D Colles' fracture of left radius, subsequent encounter for closed fracture with routine healing: Secondary | ICD-10-CM

## 2016-09-20 DIAGNOSIS — I2510 Atherosclerotic heart disease of native coronary artery without angina pectoris: Secondary | ICD-10-CM | POA: Diagnosis not present

## 2016-09-20 DIAGNOSIS — J449 Chronic obstructive pulmonary disease, unspecified: Secondary | ICD-10-CM | POA: Diagnosis not present

## 2016-09-20 DIAGNOSIS — S6292XD Unspecified fracture of left wrist and hand, subsequent encounter for fracture with routine healing: Secondary | ICD-10-CM | POA: Diagnosis not present

## 2016-09-21 ENCOUNTER — Ambulatory Visit (INDEPENDENT_AMBULATORY_CARE_PROVIDER_SITE_OTHER): Payer: Medicare HMO | Admitting: Orthopedic Surgery

## 2016-09-21 ENCOUNTER — Ambulatory Visit (INDEPENDENT_AMBULATORY_CARE_PROVIDER_SITE_OTHER): Payer: Medicare HMO

## 2016-09-21 DIAGNOSIS — S52532D Colles' fracture of left radius, subsequent encounter for closed fracture with routine healing: Secondary | ICD-10-CM | POA: Diagnosis not present

## 2016-09-21 NOTE — Progress Notes (Signed)
Fracture care follow-up  Diagnosis left Colles' fracture  Date of injury 08/23/2016  Four-week follow-up visit for x-rays left wrist out of plaster  Patient plays of numbness and tingling in his long and ring finger  He says this is better once the cast was off  His skin looks good he has no pressure areas from the cast. He has some tenderness at his fracture site he can move all his fingers as well as color looks good  His x-ray shows slightly dorsally angulated fracture at the metaphyseal region overall alignment is definitely acceptable for nonoperative treatment  I placed him in a removable Velcro wrist splint and he will come back for x-ray in 4 weeks  Encounter Diagnosis  Name Primary?  . Closed Colles' fracture of left radius with routine healing, subsequent encounter Yes

## 2016-09-25 ENCOUNTER — Ambulatory Visit: Payer: Medicare HMO | Admitting: Nutrition

## 2016-09-25 ENCOUNTER — Encounter: Payer: Medicare HMO | Attending: Family Medicine | Admitting: Nutrition

## 2016-09-25 DIAGNOSIS — E781 Pure hyperglyceridemia: Secondary | ICD-10-CM

## 2016-09-25 DIAGNOSIS — R7301 Impaired fasting glucose: Secondary | ICD-10-CM

## 2016-09-25 NOTE — Progress Notes (Signed)
Medical Nutrition Therapy:  Appt start time: 1400 end time:  1500.   Assessment:  Primary concerns today: Overweight, Prediabetes and elevated TG's.. He lives by himself. He fell a few weeks and broke his left wrist.  He is here with a male friend. He eats twice a day; eats out sometimes and eats at home too. Meals are inconsistent; skips meals often and overeats in others.   Has a CPAP machine. Last A1C 5.8% (2016). FBS 111-123 mg/dl per his chart. TG 230 mg/dl. Most he has weighed is 307 lbs; lost 6 lbs since last week or two. He wants to get down to 250's. Admits to eating a high fat high processed die, snacking at night and eating too much at times. Limited activity right now due to breaking his wrist. He appears to be contemplating changing his eating habits to lose weight and improve his blood sugars to prevent diabetes. PCP Dr. Wolfgang Phoenix Diet is high in fat, sodium and sugar and low in fresh fruits and vegetables, which is contributing to his prediabetes, obesity and hypertriglyceridemia.  Lab Results  Component Value Date   HGBA1C 5.8 11/18/2014   CMP Latest Ref Rng & Units 08/07/2016 02/07/2016 06/08/2015  Glucose 65 - 99 mg/dL 123(H) 111(H) 117(H)  BUN 8 - 27 mg/dL 11 12 20   Creatinine 0.76 - 1.27 mg/dL 1.00 1.05 1.09  Sodium 134 - 144 mmol/L 146(H) 146(H) 143  Potassium 3.5 - 5.2 mmol/L 4.1 3.9 4.2  Chloride 96 - 106 mmol/L 104 106 105  CO2 20 - 29 mmol/L 25 25 28   Calcium 8.6 - 10.2 mg/dL 9.4 8.6 9.4  Total Protein 6.0 - 8.5 g/dL 7.3 6.5 7.0  Total Bilirubin 0.0 - 1.2 mg/dL 0.5 0.8 0.9  Alkaline Phos 39 - 117 IU/L 92 90 78  AST 0 - 40 IU/L 39 20 29  ALT 0 - 44 IU/L 34 20 31    Lipid Panel     Component Value Date/Time   CHOL 171 08/07/2016 0847   TRIG 230 (H) 08/07/2016 0847   HDL 49 08/07/2016 0847   CHOLHDL 3.5 08/07/2016 0847   CHOLHDL 3.2 06/08/2015 0907   VLDL 33 (H) 06/08/2015 0907   LDLCALC 76 08/07/2016 0847   Preferred Learning Style:    No preference  indicated   Learning Readiness:   Ready  Change in progress   MEDICATIONS: ADL   DIETARY INTAKE: B) skips or cheese egg, bacon bits, ham omelet and 2 slice bread, coffee L) Boiled eggs, hot dogs, lemonade, tea or soda D) 6 crackers and drank some lemonade  Usual physical activity: ADL  Estimated energy needs: 1800  calories 200 g carbohydrates 135 g protein 50 g fat  Progress Towards Goal(s):  In progress.   Nutritional Diagnosis:  NI-1.5 Excessive energy intake As related to HIgh Fat high Salt HIgh Calorie Diet.  As evidenced by Obesity, BMI > 40 and Prediabetes.    Intervention:  Nutrition. Predidabetes and Weight Loss education provided on My Plate, CHO counting, meal planning, portion sizes, timing of meals, avoiding snacks between meals , target ranges for A1C and blood sugars, taking medications as prescribed, benefits of exercising 30 minutes per day and prevention of  DM. High Fiber Low Fat Low Sodium Diet   Goals 1. Follow My Plate 2. Cut out sodas. 3, Increase fresh fruits and vegetables. 4. Cut out processed foods 5.Lose 1-2 lbs per week Eat meals on time Don't skip meals Eat breakfast 6-8/ L) 12-2 pm  and D) 5-7 pm  Teaching Method Utilized:  Visual Auditory Hands on  Handouts given during visit include:  The Plate Method  Meal Plan Card Pre-Diabetes Instructions Weight loss tips Barriers to learning/adherence to lifestyle change: none  Demonstrated degree of understanding via:  Teach Back   Monitoring/Evaluation:  Dietary intake, exercise, meal planning, and body weight in 1 month(s). Follow up with A1C in 3 months.

## 2016-09-25 NOTE — Patient Instructions (Addendum)
Goals 1. Follow My Plate 2. Cut out sodas. 3, Increase fresh fruits and vegetables. 4. Cut out processed foods 5.Lose 1-2 lbs per week Eat meals on time Don't skip meals Eat breakfast 6-8/ L) 12-2 pm and D) 5-7 pm

## 2016-09-27 DIAGNOSIS — I10 Essential (primary) hypertension: Secondary | ICD-10-CM | POA: Diagnosis not present

## 2016-09-27 DIAGNOSIS — J449 Chronic obstructive pulmonary disease, unspecified: Secondary | ICD-10-CM | POA: Diagnosis not present

## 2016-09-27 DIAGNOSIS — I2510 Atherosclerotic heart disease of native coronary artery without angina pectoris: Secondary | ICD-10-CM | POA: Diagnosis not present

## 2016-09-28 ENCOUNTER — Ambulatory Visit: Payer: Medicare HMO | Admitting: Internal Medicine

## 2016-10-02 ENCOUNTER — Ambulatory Visit (INDEPENDENT_AMBULATORY_CARE_PROVIDER_SITE_OTHER): Payer: Medicare HMO | Admitting: Internal Medicine

## 2016-10-02 ENCOUNTER — Encounter: Payer: Self-pay | Admitting: Internal Medicine

## 2016-10-02 VITALS — BP 154/75 | HR 74 | Temp 96.7°F | Ht 73.0 in | Wt 296.6 lb

## 2016-10-02 DIAGNOSIS — K219 Gastro-esophageal reflux disease without esophagitis: Secondary | ICD-10-CM | POA: Diagnosis not present

## 2016-10-02 DIAGNOSIS — K5902 Outlet dysfunction constipation: Secondary | ICD-10-CM | POA: Diagnosis not present

## 2016-10-02 NOTE — Progress Notes (Signed)
Primary Care Physician:  [name], MD Primary Gastroenterologist:  Dr. Gala Romney  Pre-Procedure History & Physical: HPI:  [name] is a 76 y.o. male here for follow-up of GERD and her bowel function. Patient states that since he started taking an OTC preparation called "Omega". His bowel function is normalized he's not having any reflux symptoms whatsoever. In fact, denies any GI symptoms. History of pelvic floor dyssynergy. He was scheduled for biofeedback that never happened. Says he doesn't need it now. Has about 3 bowel movements weekly. Also, GERD symptoms well controlled. Denies dysphagia.  He has lost 12 pounds going to the dietitian since he was last seen. Unfortunately, he fell and sustained a Colles' type fracture left wrist is now in a brace.  Past Medical History:  Diagnosis Date  . Asthma   . BPH (benign prostatic hyperplasia)   . CAD (coronary artery disease) 01/1995  . Clostridium difficile colitis 04/2005  . Colitis 2011  . COPD (chronic obstructive pulmonary disease) ([place])   . Diverticulosis   . DJD (degenerative joint disease)   . Gastric ulcer 04/17/10   Three 36m gastric ulcers, H.pylori serologies were negative  . GERD (gastroesophageal reflux disease)   . Hyperlipidemia   . Hypertension   . Idiopathic chronic inflammatory bowel disease 05/18/2010   left-sided UC  . Kidney stone   . Obstructive sleep apnea   . Reflux 02/1995  . S/P endoscopy 07/24/10   retained gastric contents, benign bx    Past Surgical History:  Procedure Laterality Date  . CARDIOVASCULAR STRESS TEST  07/21/2009   No scintigraphic evidence of inducible myocardial ischemia  . CERVICAL SPINE SURGERY     C4-5  . COLONOSCOPY  04/2005   granularity and friability erosions from rectum to 40cm. Bx infection vs IBD. C. Diff positive at the time.   . COLONOSCOPY  05/2010   Merridy Pascoe: left-sided UC, bx with no dysplasia, shallow diverticula  . COLONOSCOPY [name] 11/07/2012   RION:[id number] proctocolitis status post segmental biopsy/Sigmoid colon polyps removed as described above. Procedure compromisd by technical difficulties. bx: Inflammation limited to sigmoid and rectum on pathology.  . CORONARY STENT PLACEMENT  01/1995  . ESOPHAGOGASTRODUODENOSCOPY  07/24/2010   RKGM:WNUUVOesophagus  . FOOT SURGERY  two  . KNEE SURGERY  two  . SHOULDER SURGERY  two  . TONSILLECTOMY    . TRANSTHORACIC ECHOCARDIOGRAM  03/23/2009   EF 60-65%, normal LV systolic function    Prior to Admission medications   Medication Sig Start Date End Date Taking? Authorizing Provider  amLODipine (NORVASC) 5 MG tablet Take 1 tablet (5 mg total) by mouth daily. 09/10/16  Yes LKathyrn Drown MD  aspirin 81 MG tablet Take 162 mg by mouth daily.     Yes [provider]  losartan (COZAAR) 25 MG tablet Take 1 tablet (25 mg total) by mouth daily. 09/10/16  Yes LKathyrn Drown MD  metoprolol tartrate (LOPRESSOR) 50 MG tablet TAKE ONE TABLET BY MOUTH ONCE DAILY IN THE MORNING AND  2  TABLETS  AT  BEDTIME Patient taking differently: Take 150 mg by mouth at bedtime. TAKE ONE TABLET BY MOUTH ONCE DAILY IN THE MORNING AND  2  TABLETS  AT  BEDTIME 08/06/16  Yes Luking, Scott A, MD  nitroGLYCERIN (NITROSTAT) 0.4 MG SL tablet Place 0.4 mg under the tongue every 5 (five) minutes as needed for chest pain.   Yes [provider]  OVER THE COUNTER MEDICATION Omega XL 4  tablets at night   Yes [provider]  [place] Takes heart pill and stomach pill that he got from Omega (pt unsure of name)   Yes [provider]  pravastatin (PRAVACHOL) 40 MG tablet Take 1 tablet (40 mg total) by mouth daily. 08/06/16  Yes [name], MD  oxyCODONE-acetaminophen (PERCOCET/ROXICET) 5-325 MG tablet Take 2 tablets by mouth every 4 (four) hours as needed for severe pain. Patient not taking: Reported on 10/02/2016 08/23/16   Daleen Bo, MD    Allergies as of 10/02/2016  . (No Known Allergies)     Family History  Problem Relation Age of Onset  . Lung cancer Mother   . Cancer Mother        breast  . Diabetes Mother   . Stroke Father   . Hypertension Father   . Colon cancer Neg Hx     Social History   Social History  . Marital status: Widowed    Spouse name: [name]  . Number of children: [name]  . Years of education: [name]   Occupational History  . maintenance     retired   Social History Main Topics  . Smoking status: Former Research scientist (life sciences)  . Smokeless tobacco: Former Systems developer    Quit date: 01/15/1970  . Alcohol use No  . Drug use: No  . Sexual activity: Yes    Partners: Female    Birth control/ protection: None     Comment: spouse   Other Topics Concern  . Not on file   Social History Narrative  . No narrative on file    Review of Systems: See HPI, otherwise negative ROS  Physical Exam: BP (!) 154/75   Pulse 74   Temp (!) 96.7 F (35.9 C) (Oral)   Ht 6' 1"  (1.854 m)   Wt 296 lb 9.6 oz (134.5 kg)   BMI 39.13 kg/m  General:   Alert,  , pleasant and cooperative in NAD; accompanied by wife. Neck:  Supple; no masses or thyromegaly. No significant cervical adenopathy. Lungs:  Clear throughout to auscultation.   No wheezes, crackles, or rhonchi. No acute distress. Heart:  Regular rate and rhythm; no murmurs, clicks, rubs,  or gallops. Abdomen: Obese. , normal bowel sounds.  Soft and nontender without appreciable mass or hepatosplenomegaly.  Pulses:  Normal pulses noted. Extremities:  Without clubbing or edema.  Impression:  Pleasant 76 year old gentleman with history of GERD and constipation now doing very well on his "Omega" supplement. He is adopted more healthy diet and has lost 12 pounds. Was committed. I have neutral recommendations on the use of Omega. Patient is sold on it. It seems to be helping. As much is anything, better diet and weight loss will likely go a long way to help this gentleman across the board.  Recommendations: Continue steady, healthy weight  loss  May continue Omega daily  Office visit in 6 months  Call if any interim problems      Notice: This dictation was prepared with Dragon dictation along with smaller phrase technology. Any transcriptional errors that result from this process are unintentional and may not be corrected upon review.

## 2016-10-02 NOTE — Patient Instructions (Addendum)
Continue steady, healthy weight loss  May continue Omega daily  Office visit in 6 months  Call if any interim problems

## 2016-10-03 DIAGNOSIS — G4733 Obstructive sleep apnea (adult) (pediatric): Secondary | ICD-10-CM | POA: Diagnosis not present

## 2016-10-15 ENCOUNTER — Encounter: Payer: Self-pay | Admitting: Family Medicine

## 2016-10-15 ENCOUNTER — Ambulatory Visit (INDEPENDENT_AMBULATORY_CARE_PROVIDER_SITE_OTHER): Payer: Medicare HMO | Admitting: Family Medicine

## 2016-10-15 VITALS — BP 140/70 | Ht 73.0 in | Wt 296.0 lb

## 2016-10-15 DIAGNOSIS — Z23 Encounter for immunization: Secondary | ICD-10-CM

## 2016-10-15 DIAGNOSIS — I10 Essential (primary) hypertension: Secondary | ICD-10-CM | POA: Diagnosis not present

## 2016-10-15 NOTE — Progress Notes (Signed)
   Subjective:    Patient ID: [name], male    DOB: 26-Jun-1940, 76 y.o.   MRN: [id number]  Hypertension  This is a chronic problem. Pertinent negatives include no chest pain, headaches or shortness of breath.  follow up on bp. meds were decreased at last visit. Orthostatics done today.  Lying - 142/74 Sitting 140/70 Standing 140/70 Patient doing much better with the medicine no longer having dizziness or drowsiness He does have some left wrist tenderness with associated with recent fall being followed by Dr. Aline Brochure  Patient did complain of low puffiness and soreness in his ring and middle finger he is wearing a wrist brace which may be contributing to some of the swelling no sign of any infection  Review of Systems  Constitutional: Negative for activity change, fatigue and fever.  Respiratory: Negative for cough and shortness of breath.   Cardiovascular: Negative for chest pain and leg swelling.  Neurological: Negative for headaches.       Objective:   Physical Exam  Constitutional: He appears well-nourished. No distress.  Cardiovascular: Normal rate, regular rhythm and normal heart sounds.   No murmur heard. Pulmonary/Chest: Effort normal and breath sounds normal. No respiratory distress.  Musculoskeletal: He exhibits no edema.  Lymphadenopathy:    He has no cervical adenopathy.  Neurological: He is alert.  Skin: No rash noted.  Psychiatric: His behavior is normal.  Vitals reviewed.   Blood pressure was rechecked by myself 130/72 sitting 130/70 standing      Assessment & Plan:  HTN good control continue current measures Soreness in his left middle and ring finger I believe are more related to his injury should get better follow-up with orthopedics Follow-up 4 months

## 2016-11-01 ENCOUNTER — Encounter: Payer: Medicare HMO | Attending: Family Medicine | Admitting: Nutrition

## 2016-11-01 VITALS — Ht 73.0 in | Wt 296.0 lb

## 2016-11-01 DIAGNOSIS — E669 Obesity, unspecified: Secondary | ICD-10-CM

## 2016-11-01 DIAGNOSIS — R739 Hyperglycemia, unspecified: Secondary | ICD-10-CM

## 2016-11-01 DIAGNOSIS — E785 Hyperlipidemia, unspecified: Secondary | ICD-10-CM

## 2016-11-01 NOTE — Patient Instructions (Addendum)
Goals 1. Lose 5 lbs per month 2. Don't skip meals 3. Keep drinking water 4. Try not to eat after supper. Eat veggies for snacks.

## 2016-11-01 NOTE — Progress Notes (Signed)
  Medical Nutrition Therapy:  Appt start time: 1400 end time:  1500.   Assessment:  Primary concerns today: Overweight, Prediabetes and elevated TG's.  Lost 5 lbs since visit.  Now eating more salads, vegetables. Cut out snacks. Drinking a lot more water.    Feels better overall. Can bend down now much easier. Clothes are fitting better and breathing is better.  Lab Results  Component Value Date   HGBA1C 5.8 11/18/2014   CMP Latest Ref Rng & Units 08/07/2016 02/07/2016 06/08/2015  Glucose 65 - 99 mg/dL 123(H) 111(H) 117(H)  BUN 8 - 27 mg/dL 11 12 20   Creatinine 0.76 - 1.27 mg/dL 1.00 1.05 1.09  Sodium 134 - 144 mmol/L 146(H) 146(H) 143  Potassium 3.5 - 5.2 mmol/L 4.1 3.9 4.2  Chloride 96 - 106 mmol/L 104 106 105  CO2 20 - 29 mmol/L 25 25 28   Calcium 8.6 - 10.2 mg/dL 9.4 8.6 9.4  Total Protein 6.0 - 8.5 g/dL 7.3 6.5 7.0  Total Bilirubin 0.0 - 1.2 mg/dL 0.5 0.8 0.9  Alkaline Phos 39 - 117 IU/L 92 90 78  AST 0 - 40 IU/L 39 20 29  ALT 0 - 44 IU/L 34 20 31    Lipid Panel     Component Value Date/Time   CHOL 171 08/07/2016 0847   TRIG 230 (H) 08/07/2016 0847   HDL 49 08/07/2016 0847   CHOLHDL 3.5 08/07/2016 0847   CHOLHDL 3.2 06/08/2015 0907   VLDL 33 (H) 06/08/2015 0907   LDLCALC 76 08/07/2016 0847   Preferred Learning Style:    No preference indicated   Learning Readiness:   Ready  Change in progress   MEDICATIONS: ADL   DIETARY INTAKE: B) Egg, cheese and sausage croissant and water L) Boiled chicken,, onions, 7 layer of salad, and egg custard D)  Can't remember. Usual physical activity: ADL  Estimated energy needs: 1800  calories 200 g carbohydrates 135 g protein 50 g fat  Progress Towards Goal(s):  In progress.   Nutritional Diagnosis:  NI-1.5 Excessive energy intake As related to HIgh Fat high Salt HIgh Calorie Diet.  As evidenced by Obesity, BMI > 40 and Prediabetes.    Intervention:  Nutrition. Predidabetes and Weight Loss education provided on My  Plate, CHO counting, meal planning, portion sizes, timing of meals, avoiding snacks between meals , target ranges for A1C and blood sugars, taking medications as prescribed, benefits of exercising 30 minutes per day and prevention of  DM. High Fiber Low Fat Low Sodium Diet   Goals 1. Lose 5 lbs per month 2. Don't skip meals 3. Keep drinking water 4. Try not to eat after supper. Eat veggies for snacks.   Eat breakfast 6-8/ L) 12-2 pm and D) 5-7 pm  Teaching Method Utilized:  Visual Auditory Hands on  Handouts given during visit include:  The Plate Method  Meal Plan Card Pre-Diabetes Instructions Weight loss tips Barriers to learning/adherence to lifestyle change: none  Demonstrated degree of understanding via:  Teach Back   Monitoring/Evaluation:  Dietary intake, exercise, meal planning, and body weight in 3 month(s). Follow up with A1C in 3 months.

## 2016-11-02 ENCOUNTER — Ambulatory Visit (INDEPENDENT_AMBULATORY_CARE_PROVIDER_SITE_OTHER): Payer: Medicare HMO

## 2016-11-02 ENCOUNTER — Ambulatory Visit (INDEPENDENT_AMBULATORY_CARE_PROVIDER_SITE_OTHER): Payer: Medicare HMO | Admitting: Orthopedic Surgery

## 2016-11-02 ENCOUNTER — Encounter: Payer: Self-pay | Admitting: Orthopedic Surgery

## 2016-11-02 VITALS — BP 139/72 | HR 57 | Ht 73.0 in | Wt 296.0 lb

## 2016-11-02 DIAGNOSIS — S52532D Colles' fracture of left radius, subsequent encounter for closed fracture with routine healing: Secondary | ICD-10-CM

## 2016-11-02 DIAGNOSIS — E559 Vitamin D deficiency, unspecified: Secondary | ICD-10-CM | POA: Diagnosis not present

## 2016-11-02 DIAGNOSIS — G5602 Carpal tunnel syndrome, left upper limb: Secondary | ICD-10-CM | POA: Diagnosis not present

## 2016-11-02 MED ORDER — ACETAMINOPHEN-CODEINE #3 300-30 MG PO TABS: 1.0000 | ORAL_TABLET | Freq: Four times a day (QID) | ORAL | 0 refills | Status: DC | PRN

## 2016-11-02 MED ORDER — GABAPENTIN 100 MG PO CAPS: 100.0000 mg | ORAL_CAPSULE | Freq: Every day | ORAL | 0 refills | Status: DC

## 2016-11-02 NOTE — Progress Notes (Signed)
Wrist fracture follow-up  Chief Complaint  Patient presents with  . Follow-up    Colles fracture Left wrist, DOI 08/23/16    Day 71  Complains of fracture site pain and numbness and tingling of the ring and long finger volar aspect left hand  Addressing the fracture care first. His x-ray shows abundant callus around the fracture slight dorsal tilt ulnar styloid fracture. There appears to be abundant callus there.  Clinically however he is tender over the ulna and wrist at the fracture site.  We will check his vitamin D to see if he has a deficiency as we are at the 10th week and this fracture normally would be healed by now  The x-ray shows dorsal tilt mild slight settling. It is intra-articular there is ulnar styloid fracture. Still concerned that he may be vitamin D deficient  New problem 10 week history of numbness and tingling ring and long finger since the fracture occurred.  Review of systems he is not diabetic does not have thyroid disease. He has no polyphagia polydipsia or fatigue  His exam shows tenderness over the distal radial fracture site as well as the ulna. His range of motion is painful in the wrist joint over the wrist joint itself is nontender and stable. He has good motor function in his hand his skin has no laceration or ulceration has decreased sensation in the median nerve distribution were and involving the ring and long finger pulses are normal  Impression carpal tunnel syndrome probably fracture or injury related  Start gabapentin 100 mg

## 2016-11-02 NOTE — Patient Instructions (Signed)
Continue wearing brace for the fracture  Get your vitamin D level check on call you with results  Come back in 4 weeks for repeat x-ray of the wrist  Start gabapentin 100 mg for carpal tunnel syndrome

## 2016-11-03 DIAGNOSIS — G4733 Obstructive sleep apnea (adult) (pediatric): Secondary | ICD-10-CM | POA: Diagnosis not present

## 2016-11-05 ENCOUNTER — Telehealth: Payer: Self-pay | Admitting: Orthopedic Surgery

## 2016-11-05 LAB — VITAMIN D 1,25 DIHYDROXY
Vitamin D 1, 25 (OH)2 Total: 28 pg/mL (ref 18–72)
Vitamin D2 1, 25 (OH)2: <8 pg/mL
Vitamin D3 1, 25 (OH)2: 28 pg/mL

## 2016-11-05 NOTE — Telephone Encounter (Signed)
Call from pharmacist received per voice mail which had been left after hours on Friday, 11/02/16, regarding question on medication prescribed by Dr Aline Brochure:   acetaminophen-codeine (TYLENOL #3) 300-30 MG tablet 30 tablet  - follow up call from Richardson Landry, lead pharmacy tech, states they did dispense the generic formula to patient.  If any questions, please call [phone].

## 2016-11-22 DIAGNOSIS — G4733 Obstructive sleep apnea (adult) (pediatric): Secondary | ICD-10-CM | POA: Diagnosis not present

## 2016-11-29 DIAGNOSIS — S52532A Colles' fracture of left radius, initial encounter for closed fracture: Secondary | ICD-10-CM

## 2016-11-29 HISTORY — DX: Colles' fracture of left radius, initial encounter for closed fracture: S52.532A

## 2016-11-30 ENCOUNTER — Ambulatory Visit (INDEPENDENT_AMBULATORY_CARE_PROVIDER_SITE_OTHER): Payer: Medicare HMO | Admitting: Orthopedic Surgery

## 2016-11-30 ENCOUNTER — Ambulatory Visit (INDEPENDENT_AMBULATORY_CARE_PROVIDER_SITE_OTHER): Payer: Medicare HMO

## 2016-11-30 DIAGNOSIS — S52532D Colles' fracture of left radius, subsequent encounter for closed fracture with routine healing: Secondary | ICD-10-CM

## 2016-11-30 DIAGNOSIS — S52532G Colles' fracture of left radius, subsequent encounter for closed fracture with delayed healing: Secondary | ICD-10-CM

## 2016-11-30 MED ORDER — HYDROCODONE-ACETAMINOPHEN 5-325 MG PO TABS: 1.0000 | ORAL_TABLET | Freq: Four times a day (QID) | ORAL | 0 refills | Status: DC | PRN

## 2016-11-30 NOTE — Patient Instructions (Signed)
You are being sent for CT scan to see if your fracture is healing

## 2016-11-30 NOTE — Progress Notes (Signed)
Fracture care follow-up  Chief Complaint  Patient presents with  . Wrist Injury  Post first visit date 80   [name] was injured on January 19 so he is now 3 months after distal radius fracture still complaining of severe pain over his left distal radius and ulnar side of his wrist as well as carpal tunnel symptoms with numbness and tingling in the median nerve distribution.  We took an x-ray today looks like the fracture healed although with his degree of pain recommend that he get a CAT scan to evaluate the healing. His x-ray shows a dorsal tilt of the fracture shortening with ulnar positive variance but calluses noted on the dorsal and volar side.  Review of Systems  Constitutional: Negative for fever.  Skin: Negative.   Neurological: Positive for tingling and sensory change.   He is awake alert and oriented 3 his mood and affect are pleasant his general appearance is normal grooming His clinical exam is tender over the fracture site ulna and decreased sensation the median nerve distribution  Sensory loss and median nerve distribution. His range of motion is limited in both flexion and extension his wrist feels stable he has tenderness at his fracture site prescription strength is abnormal pulse and perfusion are normal  He will get a CT scan come back and see [place].  He did not respond well to the Tylenol with Codeine so I put him on hydrocodone for pain  Meds ordered this encounter  Medications  . HYDROcodone-acetaminophen (NORCO) 5-325 MG tablet    Sig: Take 1 tablet by mouth every 6 (six) hours as needed for moderate pain.    Dispense:  30 tablet    Refill:  0   Encounter Diagnosis  Name Primary?  . Closed Colles' fracture of left radius with delayed healing, subsequent encounter Yes

## 2016-12-03 ENCOUNTER — Telehealth: Payer: Self-pay | Admitting: Radiology

## 2016-12-03 DIAGNOSIS — G4733 Obstructive sleep apnea (adult) (pediatric): Secondary | ICD-10-CM | POA: Diagnosis not present

## 2016-12-03 NOTE — Telephone Encounter (Signed)
Called patient about CT scan Nov 2nd and made his follow up appointment as well.

## 2016-12-07 ENCOUNTER — Ambulatory Visit (HOSPITAL_COMMUNITY)
Admission: RE | Admit: 2016-12-07 | Discharge: 2016-12-07 | Disposition: A | Discharge status: Home / Self Care | Payer: Medicare HMO | Source: Ambulatory Visit | Attending: Orthopedic Surgery | Admitting: Orthopedic Surgery

## 2016-12-07 DIAGNOSIS — X58XXXD Exposure to other specified factors, subsequent encounter: Secondary | ICD-10-CM | POA: Insufficient documentation

## 2016-12-07 DIAGNOSIS — S52532G Colles' fracture of left radius, subsequent encounter for closed fracture with delayed healing: Secondary | ICD-10-CM | POA: Insufficient documentation

## 2016-12-07 DIAGNOSIS — M19032 Primary osteoarthritis, left wrist: Secondary | ICD-10-CM | POA: Diagnosis not present

## 2016-12-10 ENCOUNTER — Ambulatory Visit (INDEPENDENT_AMBULATORY_CARE_PROVIDER_SITE_OTHER): Payer: Medicare HMO | Admitting: Orthopedic Surgery

## 2016-12-10 ENCOUNTER — Encounter: Payer: Self-pay | Admitting: Orthopedic Surgery

## 2016-12-10 DIAGNOSIS — S52532G Colles' fracture of left radius, subsequent encounter for closed fracture with delayed healing: Secondary | ICD-10-CM | POA: Diagnosis not present

## 2016-12-10 DIAGNOSIS — G5602 Carpal tunnel syndrome, left upper limb: Secondary | ICD-10-CM

## 2016-12-10 NOTE — Patient Instructions (Signed)
preop from Dr Wolfgang Phoenix

## 2016-12-10 NOTE — Progress Notes (Signed)
Progress Note   Patient ID: [name], male   DOB: 07/25/1940, 76 y.o.   MRN: [id number]  Chief Complaint  Patient presents with  . Wrist Pain    left wrist pain   . Results    review CT Scan    HPI July 19 he had distal radius fracture.  He had persistent pain we sent him for a CAT scan.  He also had symptoms of carpal tunnel syndrome throughout his fracture course.  He presents after CT scan of the wrist to check for healing  He was also sent for vitamin D level and that showed that his vitamin D levels were normal.  Review of Systems  Musculoskeletal: Positive for joint pain.  Neurological: Positive for tingling and sensory change.   No outpatient medications have been marked as taking for the 12/10/16 encounter (Appointment) with Carole Civil, MD.    No Known Allergies     Physical Exam  Vital signs height 6 7 weight 290 blood pressure 137/70 and pulse 63 Gen. appearance the patient's appearance is normal with normal grooming and  hygiene The patient is oriented to person place and time Mood and affect are normal   Ortho Exam  The patient has tenderness on the ulnar side of the wrist the fracture site is not tender.  The ring finger and long finger have decreased sensation from the PIP joint distally.  He has normal pronation but decreased supination compared to his opposite side skin is warm dry and intact pulse and perfusion are normal  Medical decision-making Encounter Diagnoses  Name Primary?  . Closed Colles' fracture of left radius with delayed healing, subsequent encounter Yes  . Carpal tunnel syndrome of left wrist     I have reviewed the CAT scan and my reading is as follows The CT scan shows arthritis of the carpal bones, healed fracture of the distal radius with effusion in the radiocarpal joint  CT scan report is read as   fINDINGS: Bones/Joint/Cartilage   There is a healed previously comminuted fracture of the distal  left radius with residual dorsal impaction deformity. There is a residual small linear defect in the articular cortical surface of the distal radius. There is a typical nonunion fracture of the ulnar styloid.   There is joint space narrowing between the scaphoid and radial styloid consistent with arthritis. Carpal bone alignment is normal.   Diffuse osteopenia.   Slight widening of the scapholunate distance which could represent damage to the scapholunate ligament.   Moderate arthritis of the first [facility] joint.   Effusions in the midcarpal joint and radiocarpal joint.   IMPRESSION: Healed fracture of the distal left radius with residual dorsal impaction deformity.   Slight arthritic changes of the radiocarpal joint.   Nonunion avulsion of the ulnar styloid.   Midcarpal and radiocarpal joint effusions, nonspecific.   Slight widening of the scapholunate distance which could represent disruption of the scapholunate ligament.     Electronically Signed   By: Lorriane Shire M.D.   On: 12/07/2016 09:11  The patient probably needs a Darrach LEFT WRIST  He is interested in going ahead with this after we get preop clearance he does have sleep apnea he had a stent in his heart in 1997.  The procedure has been fully reviewed with the patient; The risks and benefits of surgery have been discussed and explained and understood. Alternative treatment has also been reviewed, questions were encouraged and answered. The postoperative plan is  also been reviewed.  Arther Abbott, MD 12/10/2016 10:11 AM

## 2016-12-20 ENCOUNTER — Telehealth: Payer: Self-pay | Admitting: Family Medicine

## 2016-12-20 NOTE — Telephone Encounter (Signed)
Please let the patient know that I have not heard anything at this point from Dr. Aline Brochure regarding preoperative clearance-typically we like to do a office visit to make sure from a medical standpoint he is perfectly fine for surgery.  I am willing to work him in Monday or Tuesday if he is willing to come in for this.  He should also call Dr. Ruthe Mannan office to find out when they are proposing to do the surgery

## 2016-12-20 NOTE — Telephone Encounter (Signed)
Patient wants to know if Dr. Aline Brochure has contacted Dr. Nicki Reaper?  [name] he was suppose to about his upcoming surgery?

## 2016-12-20 NOTE — Telephone Encounter (Signed)
Left message return call 12/20/2016

## 2016-12-20 NOTE — Telephone Encounter (Signed)
Spoke with patient and informed her per Dr.Scott Luking- at this point we have not heard anything from Dr.Harrison's office in regards to a preoperative surgical clearance. Typically we like to do a office visit to make sure from a medical standpoint you are perfectly fine for surgery. We can work you in on Monday or Tuesday. Patient verbalized understanding and was sent to front desk to schedule and office visit.

## 2016-12-25 ENCOUNTER — Encounter: Payer: Self-pay | Admitting: Family Medicine

## 2016-12-25 ENCOUNTER — Telehealth: Payer: Self-pay | Admitting: Radiology

## 2016-12-25 ENCOUNTER — Ambulatory Visit (INDEPENDENT_AMBULATORY_CARE_PROVIDER_SITE_OTHER): Payer: Medicare HMO | Admitting: Family Medicine

## 2016-12-25 VITALS — BP 132/68 | Ht 73.0 in | Wt 299.5 lb

## 2016-12-25 DIAGNOSIS — I10 Essential (primary) hypertension: Secondary | ICD-10-CM | POA: Diagnosis not present

## 2016-12-25 NOTE — Progress Notes (Signed)
   Subjective:    Patient ID: [name], male    DOB: [birth year], 76 y.o.   MRN: [id number]  HPI Patient in today to get surgical clearance for surgery to his left wrist with Dr.Harrison.  This patient has history of hypertension heart disease he also has a history of obstructive sleep apnea and COPD.  Undergoing surgery coming up on his left wrist.  General anesthesia felt to be the same day.  More than likely be released later that day.  The patient denies any PND denies orthopnea denies chest tightness pressure pain or shortness of breath with activity.  He does get short of breath if he pushes himself too hard because of his COPD Patient states no other concerns this visit.  Patient does not get short of breath with walking denies any chest tightness pressure pain with doing mild activity around the house  Review of Systems No bleeding issues no chest tightness pressure pain no PND no orthopnea    Objective:   Physical Exam Trace edema in his ankles lungs are clear no crackles heart is regular pulse normal BP is good       Assessment & Plan:  Presurgical clearance This patient should be able to do surgery without difficulty The patient is approved to do surgery I do not feel the patient needs any type of preoperative cardiac testing The patient was instructed to discuss with the surgeon all risks and benefits of surgery The patient is aware of the importance of following surgeons directions to minimize risk of complications  Dr. Ruthe Mannan office should schedule with the patient the surgery

## 2016-12-25 NOTE — Telephone Encounter (Signed)
Notes from Dr Wolfgang Phoenix are in system, patient cleared for wrist surgery, will you post?   Darrach LEFT WRIST

## 2017-01-03 DIAGNOSIS — G4733 Obstructive sleep apnea (adult) (pediatric): Secondary | ICD-10-CM | POA: Diagnosis not present

## 2017-01-04 ENCOUNTER — Other Ambulatory Visit: Payer: Self-pay | Admitting: Orthopedic Surgery

## 2017-01-07 ENCOUNTER — Telehealth: Payer: Self-pay | Admitting: Radiology

## 2017-01-07 ENCOUNTER — Telehealth: Payer: Self-pay | Admitting: Orthopedic Surgery

## 2017-01-07 NOTE — Telephone Encounter (Signed)
Patient called back, and Inez Catalina gave him the information

## 2017-01-07 NOTE — Telephone Encounter (Signed)
I have called pt to advise Left message for him to call me back

## 2017-01-07 NOTE — Telephone Encounter (Signed)
-----   Message from Josue Hector sent at 01/04/2017  1:04 PM EST ----- Regarding: surgery Pre op 12/11 @ 1:45, surgery 12/13 @ 12:00

## 2017-01-07 NOTE — Telephone Encounter (Signed)
Patient called asking about his surgery. Stated he was waiting to hear from Dr. Ruthe Mannan nurse about it. I told him I would find out and let him know what he needs since the nurse is in with the doctor and a patient. I found out his surgery information where Amy had called to let him know this morning. I called the patient back and relayed the surgery and pre-op dates and times to him.

## 2017-01-08 ENCOUNTER — Telehealth: Payer: Self-pay | Admitting: Radiology

## 2017-01-08 NOTE — Telephone Encounter (Signed)
[employer] and there is not a pre certification required for the surgery [id number] and [phone]  Reference number for the call is [id number] and he has surgical coverage with his plan.

## 2017-01-15 ENCOUNTER — Encounter (HOSPITAL_COMMUNITY): Payer: Self-pay

## 2017-01-15 ENCOUNTER — Other Ambulatory Visit (HOSPITAL_COMMUNITY): Payer: Self-pay

## 2017-01-15 ENCOUNTER — Other Ambulatory Visit: Payer: Self-pay

## 2017-01-15 ENCOUNTER — Encounter (HOSPITAL_COMMUNITY)
Admission: RE | Admit: 2017-01-15 | Discharge: 2017-01-15 | Disposition: A | Discharge status: Home / Self Care | Payer: Medicare HMO | Source: Ambulatory Visit | Attending: Orthopedic Surgery | Admitting: Orthopedic Surgery

## 2017-01-15 DIAGNOSIS — Z79899 Other long term (current) drug therapy: Secondary | ICD-10-CM | POA: Diagnosis not present

## 2017-01-15 DIAGNOSIS — I2510 Atherosclerotic heart disease of native coronary artery without angina pectoris: Secondary | ICD-10-CM | POA: Diagnosis not present

## 2017-01-15 DIAGNOSIS — M25832 Other specified joint disorders, left wrist: Secondary | ICD-10-CM | POA: Diagnosis not present

## 2017-01-15 DIAGNOSIS — J449 Chronic obstructive pulmonary disease, unspecified: Secondary | ICD-10-CM | POA: Diagnosis not present

## 2017-01-15 DIAGNOSIS — R7303 Prediabetes: Secondary | ICD-10-CM | POA: Diagnosis not present

## 2017-01-15 DIAGNOSIS — Z6838 Body mass index (BMI) 38.0-38.9, adult: Secondary | ICD-10-CM | POA: Diagnosis not present

## 2017-01-15 DIAGNOSIS — K219 Gastro-esophageal reflux disease without esophagitis: Secondary | ICD-10-CM | POA: Diagnosis not present

## 2017-01-15 DIAGNOSIS — I10 Essential (primary) hypertension: Secondary | ICD-10-CM | POA: Diagnosis not present

## 2017-01-15 DIAGNOSIS — G5602 Carpal tunnel syndrome, left upper limb: Secondary | ICD-10-CM | POA: Diagnosis not present

## 2017-01-15 DIAGNOSIS — K449 Diaphragmatic hernia without obstruction or gangrene: Secondary | ICD-10-CM | POA: Diagnosis not present

## 2017-01-15 DIAGNOSIS — Z87891 Personal history of nicotine dependence: Secondary | ICD-10-CM | POA: Diagnosis not present

## 2017-01-15 HISTORY — DX: Personal history of urinary calculi: Z87.442

## 2017-01-15 LAB — BASIC METABOLIC PANEL
Anion gap: 9 (ref 5–15)
BUN: 14 mg/dL (ref 6–20)
CO2: 25 mmol/L (ref 22–32)
Calcium: 9.3 mg/dL (ref 8.9–10.3)
Chloride: 106 mmol/L (ref 101–111)
Creatinine, Ser: 0.86 mg/dL (ref 0.61–1.24)
GFR calc Af Amer: >60 mL/min (ref >60)
GFR calc non Af Amer: >60 mL/min (ref >60)
Glucose, Bld: 93 mg/dL (ref 65–99)
Potassium: 3.9 mmol/L (ref 3.5–5.1)
Sodium: 140 mmol/L (ref 135–145)

## 2017-01-15 LAB — CBC WITH DIFFERENTIAL/PLATELET
Basophils Absolute: 0.1 10*3/uL (ref 0.0–0.1)
Basophils Relative: 2 %
Eosinophils Absolute: 0.4 10*3/uL (ref 0.0–0.7)
Eosinophils Relative: 4 %
HCT: 47.1 % (ref 39.0–52.0)
Hemoglobin: 14.7 g/dL (ref 13.0–17.0)
Lymphocytes Relative: 41 %
Lymphs Abs: 3.5 10*3/uL (ref 0.7–4.0)
MCH: 29.4 pg (ref 26.0–34.0)
MCHC: 31.2 g/dL (ref 30.0–36.0)
MCV: 94.2 fL (ref 78.0–100.0)
Monocytes Absolute: 0.8 10*3/uL (ref 0.1–1.0)
Monocytes Relative: 9 %
Neutro Abs: 3.8 10*3/uL (ref 1.7–7.7)
Neutrophils Relative %: 44 %
Platelets: 260 10*3/uL (ref 150–400)
RBC: 5 MIL/uL (ref 4.22–5.81)
RDW: 13 % (ref 11.5–15.5)
WBC: 8.6 10*3/uL (ref 4.0–10.5)

## 2017-01-15 NOTE — Patient Instructions (Signed)
[name]  01/15/2017     @PREFPERIOPPHARMACY @   Your procedure is scheduled on  01/17/2017  Report to [facility] at  [phone] A.M.  Call this number if you have problems the morning of surgery:  [phone]   Remember:  Do not eat food or drink liquids after midnight.  Take these medicines the morning of surgery with A SIP OF WATER  Norvasc, neurontin, hydrocodone, losartan.   Do not wear jewelry, make-up or nail polish.  Do not wear lotions, powders, or perfumes, or deodorant.  Do not shave 48 hours prior to surgery.  Men may shave face and neck.  Do not bring valuables to the hospital.  [facility] is not responsible for any belongings or valuables.  Contacts, dentures or bridgework may not be worn into surgery.  Leave your suitcase in the car.  After surgery it may be brought to your room.  For patients admitted to the hospital, discharge time will be determined by your treatment team.  Patients discharged the day of surgery will not be allowed to drive home.   Name and phone number of your driver:   family Special instructions:  None  Please read over the following fact sheets that you were given. Anesthesia Post-op Instructions and Care and Recovery After Surgery       Carpal Tunnel Release Carpal tunnel release is a surgical procedure to relieve numbness and pain in your hand that are caused by carpal tunnel syndrome. Your carpal tunnel is a narrow, hollow space in your wrist. It passes between your wrist bones and a band of connective tissue (transverse carpal ligament). The nerve that supplies most of your hand (median nerve) passes through this space, and so do the connections between your fingers and the muscles of your arm (tendons). Carpal tunnel syndrome makes this space swell and become narrow, and this causes pain and numbness. In carpal tunnel release surgery, a surgeon cuts through the transverse carpal ligament to make more room in the  carpal tunnel space. You may have this surgery if other types of treatment have not worked. Tell a health care provider about:  Any allergies you have.  All medicines you are taking, including vitamins, herbs, eye drops, creams, and over-the-counter medicines.  Any problems you or family members have had with anesthetic medicines.  Any blood disorders you have.  Any surgeries you have had.  Any medical conditions you have. What are the risks? Generally, this is a safe procedure. However, problems may occur, including:  Bleeding.  Infection.  Injury to the median nerve.  Need for additional surgery.  What happens before the procedure?  Ask your health care provider about: ? Changing or stopping your regular medicines. This is especially important if you are taking diabetes medicines or blood thinners. ? Taking medicines such as aspirin and ibuprofen. These medicines can thin your blood. Do not take these medicines before your procedure if your health care provider instructs you not to.  Do not eat or drink anything after midnight on the night before the procedure or as directed by your health care provider.  Plan to have someone take you home after the procedure. What happens during the procedure?  An IV tube may be inserted into a vein.  You will be given one of the following: ? A medicine that numbs the wrist area (local anesthetic). You may also be given a medicine to make you relax (  sedative). ? A medicine that makes you go to sleep (general anesthetic).  Your arm, hand, and wrist will be cleaned with a germ-killing solution (antiseptic).  Your surgeon will make a surgical cut (incision) over the palm side of your wrist. The surgeon will pull aside the skin of your wrist to expose the carpal tunnel space.  The surgeon will cut the transverse carpal ligament.  The edges of the incision will be closed with stitches (sutures) or staples.  A bandage (dressing) will be  placed over your wrist and wrapped around your hand and wrist. What happens after the procedure?  You may spend some time in a recovery area.  Your blood pressure, heart rate, breathing rate, and blood oxygen level will be monitored often until the medicines you were given have worn off.  You will likely have some pain. You will be given pain medicine.  You may need to wear a splint or a wrist brace over your dressing. This information is not intended to replace advice given to you by your health care provider. Make sure you discuss any questions you have with your health care provider. Document Released: 04/14/2003 Document Revised: 06/30/2015 Document Reviewed: 09/09/2013 Elsevier Interactive Patient Education  2017 [place] After Refer to this sheet in the next few weeks. These instructions provide you with information about caring for yourself after your procedure. Your health care provider may also give you more specific instructions. Your treatment has been planned according to current medical practices, but problems sometimes occur. Call your health care provider if you have any problems or questions after your procedure. What can I expect after the procedure? After your procedure, it is typical to have the following:  Pain.  Numbness.  Tingling.  Swelling.  Stiffness.  Bruising.  Follow these instructions at home:  Take medicines only as directed by your health care provider.  There are many different ways to close and cover an incision, including stitches (sutures), skin glue, and adhesive strips. Follow your health care provider's instructions about: ? Incision care. ? Bandage (dressing) changes and removal. ? Incision closure removal.  Wear a splint or a brace as directed by your surgeon. You may need to do this for 2-3 weeks.  Keep your hand raised (elevated) above the level of your heart while you are resting. Move your fingers  often.  Avoid activities that cause hand pain.  Ask your surgeon when you can start to do all of your usual activities again, such as: ? Driving. ? Returning to work. ? Bathing and swimming.  Keep all follow-up visits as directed by your health care provider. This is important. You may need physical therapy for several months to speed healing and regain movement. Contact a health care provider if:  You have drainage, redness, swelling, or pain at your incision site.  You have a fever.  You have chills.  Your pain medicine is not working.  Your symptoms do not go away after 2 months.  Your symptoms go away and then return. Get help right away if:  You have pain or numbness that is getting worse.  Your fingers change color.  You are not able to move your fingers. This information is not intended to replace advice given to you by your health care provider. Make sure you discuss any questions you have with your health care provider. Document Released: 08/11/2004 Document Revised: 06/30/2015 Document Reviewed: 09/09/2013 Elsevier Interactive Patient Education  2017 [employer].  Monitored Anesthesia Care Anesthesia is a term that refers to techniques, procedures, and medicines that help a person stay safe and comfortable during a medical procedure. Monitored anesthesia care, or sedation, is one type of anesthesia. Your anesthesia specialist may recommend sedation if you will be having a procedure that does not require you to be unconscious, such as:  Cataract surgery.  A dental procedure.  A biopsy.  A colonoscopy.  During the procedure, you may receive a medicine to help you relax (sedative). There are three levels of sedation:  Mild sedation. At this level, you may feel awake and relaxed. You will be able to follow directions.  Moderate sedation. At this level, you will be sleepy. You may not remember the procedure.  Deep sedation. At this level, you will be asleep.  You will not remember the procedure.  The more medicine you are given, the deeper your level of sedation will be. Depending on how you respond to the procedure, the anesthesia specialist may change your level of sedation or the type of anesthesia to fit your needs. An anesthesia specialist will monitor you closely during the procedure. Let your health care provider know about:  Any allergies you have.  All medicines you are taking, including vitamins, herbs, eye drops, creams, and over-the-counter medicines.  Any use of steroids (by mouth or as a cream).  Any problems you or family members have had with sedatives and anesthetic medicines.  Any blood disorders you have.  Any surgeries you have had.  Any medical conditions you have, such as sleep apnea.  Whether you are pregnant or may be pregnant.  Any use of cigarettes, alcohol, or street drugs. What are the risks? Generally, this is a safe procedure. However, problems may occur, including:  Getting too much medicine (oversedation).  Nausea.  Allergic reaction to medicines.  Trouble breathing. If this happens, a breathing tube may be used to help with breathing. It will be removed when you are awake and breathing on your own.  Heart trouble.  Lung trouble.  Before the procedure Staying hydrated Follow instructions from your health care provider about hydration, which may include:  Up to 2 hours before the procedure - you may continue to drink clear liquids, such as water, clear fruit juice, black coffee, and plain tea.  Eating and drinking restrictions Follow instructions from your health care provider about eating and drinking, which may include:  8 hours before the procedure - stop eating heavy meals or foods such as meat, fried foods, or fatty foods.  6 hours before the procedure - stop eating light meals or foods, such as toast or cereal.  6 hours before the procedure - stop drinking milk or drinks that contain  milk.  2 hours before the procedure - stop drinking clear liquids.  Medicines Ask your health care provider about:  Changing or stopping your regular medicines. This is especially important if you are taking diabetes medicines or blood thinners.  Taking medicines such as aspirin and ibuprofen. These medicines can thin your blood. Do not take these medicines before your procedure if your health care provider instructs you not to.  Tests and exams  You will have a physical exam.  You may have blood tests done to show: ? How well your kidneys and liver are working. ? How well your blood can clot.  General instructions  Plan to have someone take you home from the hospital or clinic.  If you will be going home right after  the procedure, plan to have someone with you for 24 hours.  What happens during the procedure?  Your blood pressure, heart rate, breathing, level of pain and overall condition will be monitored.  An IV tube will be inserted into one of your veins.  Your anesthesia specialist will give you medicines as needed to keep you comfortable during the procedure. This may mean changing the level of sedation.  The procedure will be performed. After the procedure  Your blood pressure, heart rate, breathing rate, and blood oxygen level will be monitored until the medicines you were given have worn off.  Do not drive for 24 hours if you received a sedative.  You may: ? Feel sleepy, clumsy, or nauseous. ? Feel forgetful about what happened after the procedure. ? Have a sore throat if you had a breathing tube during the procedure. ? Vomit. This information is not intended to replace advice given to you by your health care provider. Make sure you discuss any questions you have with your health care provider. Document Released: 10/18/2004 Document Revised: 07/01/2015 Document Reviewed: 05/15/2015 Elsevier Interactive Patient Education  2018 [place], Care After These instructions provide you with information about caring for yourself after your procedure. Your health care provider may also give you more specific instructions. Your treatment has been planned according to current medical practices, but problems sometimes occur. Call your health care provider if you have any problems or questions after your procedure. What can I expect after the procedure? After your procedure, it is common to:  Feel sleepy for several hours.  Feel clumsy and have poor balance for several hours.  Feel forgetful about what happened after the procedure.  Have poor judgment for several hours.  Feel nauseous or vomit.  Have a sore throat if you had a breathing tube during the procedure.  Follow these instructions at home: For at least 24 hours after the procedure:   Do not: ? Participate in activities in which you could fall or become injured. ? Drive. ? Use heavy machinery. ? Drink alcohol. ? Take sleeping pills or medicines that cause drowsiness. ? Make important decisions or sign legal documents. ? Take care of children on your own.  Rest. Eating and drinking  Follow the diet that is recommended by your health care provider.  If you vomit, drink water, juice, or soup when you can drink without vomiting.  Make sure you have little or no nausea before eating solid foods. General instructions  Have a responsible adult stay with you until you are awake and alert.  Take over-the-counter and prescription medicines only as told by your health care provider.  If you smoke, do not smoke without supervision.  Keep all follow-up visits as told by your health care provider. This is important. Contact a health care provider if:  You keep feeling nauseous or you keep vomiting.  You feel light-headed.  You develop a rash.  You have a fever. Get help right away if:  You have trouble breathing. This information is not  intended to replace advice given to you by your health care provider. Make sure you discuss any questions you have with your health care provider. Document Released: 05/15/2015 Document Revised: 09/14/2015 Document Reviewed: 05/15/2015 Elsevier Interactive Patient Education  [employer].

## 2017-01-16 NOTE — H&P (Signed)
Chief Complaint  Patient presents with  . Wrist Pain      left wrist pain   . Results      review CT Scan      HPI July 19 he had distal radius fracture.   He had persistent pain we sent him for a CT scan.  He also had symptoms of carpal tunnel syndrome throughout his fracture course.   He presents after CT scan of the wrist to check for healing   He was also sent for vitamin D level and that showed that his vitamin D levels were normal.  His main pain is on the ulnar side of the wrist where x-rays show carpal ulnar abutment.  He says the pain is nonradiating dull aching and severe enough to cause him a lot of discomfort and he was placed on Tylenol with codeine for pain with only minimal relief.   Review of Systems  Musculoskeletal: Positive for joint pain.  Neurological: Positive for tingling and sensory change.  Review of systems remaining systems were normal      No Known Allergies   Physical Exam  Constitutional: He is oriented to person, place, and time.  Eyes: Pupils are equal, round, and reactive to light.  Neck: No JVD present. No tracheal deviation present.  Cardiovascular: Normal rate, normal heart sounds and intact distal pulses.  Pulmonary/Chest: Effort normal.  Abdominal: Soft.  Neurological: He is alert and oriented to person, place, and time. He displays normal reflexes. A sensory deficit is present. He exhibits normal muscle tone. Coordination normal.  Skin: Skin is warm. Capillary refill takes less than 2 seconds. No rash noted. No erythema. No pallor.  Psychiatric: He has a normal mood and affect. His behavior is normal. Judgment and thought content normal.   Ortho Exam  Left wrist exam The patient has tenderness on the ulnar side of the wrist the fracture site is not tender.  The ring finger and long finger have decreased sensation from the PIP joint distally.  He has normal pronation but decreased supination compared to his opposite side skin is warm dry  and intact pulse and perfusion are normal   Medical decision-making     Encounter Diagnoses  Name Primary?  . Closed Colles' fracture of left radius with delayed healing, subsequent encounter Yes  . Carpal tunnel syndrome of left wrist        I have reviewed the CAT scan and my reading is as follows The CT scan shows arthritis of the carpal bones, healed fracture of the distal radius with effusion in the radiocarpal joint   CT scan report is read as    fINDINGS: Bones/Joint/Cartilage   There is a healed previously comminuted fracture of the distal left radius with residual dorsal impaction deformity. There is a residual small linear defect in the articular cortical surface of the distal radius. There is a typical nonunion fracture of the ulnar styloid.   There is joint space narrowing between the scaphoid and radial styloid consistent with arthritis. Carpal bone alignment is normal.   Diffuse osteopenia.   Slight widening of the scapholunate distance which could represent damage to the scapholunate ligament.   Moderate arthritis of the first [facility] joint.   Effusions in the midcarpal joint and radiocarpal joint.   IMPRESSION: Healed fracture of the distal left radius with residual dorsal impaction deformity.   Slight arthritic changes of the radiocarpal joint.   Nonunion avulsion of the ulnar styloid.   Midcarpal and radiocarpal  joint effusions, nonspecific.   Slight widening of the scapholunate distance which could represent disruption of the scapholunate ligament.     Electronically Signed   By: Lorriane Shire M.D.   On: 12/07/2016 09:11   The patient probably needs a Darrach LEFT WRIST  He is interested in going ahead with this after we get preop clearance he does have sleep apnea he had a stent in his heart in 1997.   The procedure has been fully reviewed with the patient; The risks and benefits of surgery have been discussed and explained and understood.  Alternative treatment has also been reviewed, questions were encouraged and answered. The postoperative plan is also been reviewed.  CT  FINDINGS: Bones/Joint/Cartilage   There is a healed previously comminuted fracture of the distal left radius with residual dorsal impaction deformity. There is a residual small linear defect in the articular cortical surface of the distal radius. There is a typical nonunion fracture of the ulnar styloid.   There is joint space narrowing between the scaphoid and radial styloid consistent with arthritis. Carpal bone alignment is normal.   Diffuse osteopenia.   Slight widening of the scapholunate distance which could represent damage to the scapholunate ligament.   Moderate arthritis of the first [facility] joint.   Effusions in the midcarpal joint and radiocarpal joint.   IMPRESSION: Healed fracture of the distal left radius with residual dorsal impaction deformity.   Slight arthritic changes of the radiocarpal joint.   Nonunion avulsion of the ulnar styloid.   Midcarpal and radiocarpal joint effusions, nonspecific.   Slight widening of the scapholunate distance which could represent disruption of the scapholunate ligament.     Electronically Signed   By: Lorriane Shire M.D.   On: 12/07/2016 09:11  The patient is having such severe ulnar-sided pain that he wishes to have a procedure done to try to relieve it.  Based on his low demand, his age and overall condition of his wrist we have opted for a    Darrah procedure left wrist and we will do a left carpal tunnel release carpal tunnel release to address the ring and long finger numbness and tingling

## 2017-01-17 ENCOUNTER — Ambulatory Visit (HOSPITAL_COMMUNITY): Payer: Medicare HMO | Admitting: Anesthesiology

## 2017-01-17 ENCOUNTER — Encounter (HOSPITAL_COMMUNITY): Payer: Self-pay | Admitting: *Deleted

## 2017-01-17 ENCOUNTER — Ambulatory Visit (HOSPITAL_COMMUNITY): Payer: Medicare HMO

## 2017-01-17 ENCOUNTER — Encounter (HOSPITAL_COMMUNITY)
Admission: RE | Disposition: A | Discharge status: Home / Self Care | Payer: Self-pay | Source: Ambulatory Visit | Attending: Orthopedic Surgery

## 2017-01-17 ENCOUNTER — Ambulatory Visit (HOSPITAL_COMMUNITY)
Admission: RE | Admit: 2017-01-17 | Discharge: 2017-01-17 | Disposition: A | Discharge status: Home / Self Care | Payer: Medicare HMO | Source: Ambulatory Visit | Attending: Orthopedic Surgery | Admitting: Orthopedic Surgery

## 2017-01-17 DIAGNOSIS — K219 Gastro-esophageal reflux disease without esophagitis: Secondary | ICD-10-CM | POA: Insufficient documentation

## 2017-01-17 DIAGNOSIS — S52602P Unspecified fracture of lower end of left ulna, subsequent encounter for closed fracture with malunion: Secondary | ICD-10-CM | POA: Diagnosis not present

## 2017-01-17 DIAGNOSIS — I10 Essential (primary) hypertension: Secondary | ICD-10-CM | POA: Diagnosis not present

## 2017-01-17 DIAGNOSIS — G5602 Carpal tunnel syndrome, left upper limb: Secondary | ICD-10-CM | POA: Diagnosis not present

## 2017-01-17 DIAGNOSIS — R7303 Prediabetes: Secondary | ICD-10-CM | POA: Diagnosis not present

## 2017-01-17 DIAGNOSIS — J449 Chronic obstructive pulmonary disease, unspecified: Secondary | ICD-10-CM | POA: Insufficient documentation

## 2017-01-17 DIAGNOSIS — I2510 Atherosclerotic heart disease of native coronary artery without angina pectoris: Secondary | ICD-10-CM | POA: Diagnosis not present

## 2017-01-17 DIAGNOSIS — S63592A Other specified sprain of left wrist, initial encounter: Secondary | ICD-10-CM | POA: Diagnosis not present

## 2017-01-17 DIAGNOSIS — M25832 Other specified joint disorders, left wrist: Secondary | ICD-10-CM | POA: Insufficient documentation

## 2017-01-17 DIAGNOSIS — Z79899 Other long term (current) drug therapy: Secondary | ICD-10-CM | POA: Insufficient documentation

## 2017-01-17 DIAGNOSIS — S52502P Unspecified fracture of the lower end of left radius, subsequent encounter for closed fracture with malunion: Secondary | ICD-10-CM

## 2017-01-17 DIAGNOSIS — Z6838 Body mass index (BMI) 38.0-38.9, adult: Secondary | ICD-10-CM | POA: Insufficient documentation

## 2017-01-17 DIAGNOSIS — S52592A Other fractures of lower end of left radius, initial encounter for closed fracture: Secondary | ICD-10-CM | POA: Diagnosis not present

## 2017-01-17 DIAGNOSIS — K449 Diaphragmatic hernia without obstruction or gangrene: Secondary | ICD-10-CM | POA: Diagnosis not present

## 2017-01-17 DIAGNOSIS — Z87891 Personal history of nicotine dependence: Secondary | ICD-10-CM | POA: Insufficient documentation

## 2017-01-17 HISTORY — PX: ULNAR HEAD EXCISION: SHX6344

## 2017-01-17 HISTORY — PX: CARPAL TUNNEL RELEASE: SHX101

## 2017-01-17 LAB — GLUCOSE, CAPILLARY
Glucose-Capillary: 96 mg/dL (ref 65–99)
Glucose-Capillary: 104 mg/dL — ABNORMAL HIGH (ref 65–99)

## 2017-01-17 SURGERY — CARPAL TUNNEL RELEASE
Anesthesia: Regional | Laterality: Left

## 2017-01-17 MED ORDER — BUPIVACAINE HCL (PF) 0.5 % IJ SOLN
INTRAMUSCULAR | Status: DC | PRN
  Administered 2017-01-17: 18 mL
  Administered 2017-01-17: 12 mL

## 2017-01-17 MED ORDER — BUPIVACAINE HCL (PF) 0.5 % IJ SOLN
INTRAMUSCULAR | Status: AC
  Filled 2017-01-17: qty 30

## 2017-01-17 MED ORDER — LIDOCAINE HCL (PF) 0.5 % IJ SOLN
INTRAMUSCULAR | Status: AC
  Filled 2017-01-17: qty 50

## 2017-01-17 MED ORDER — LACTATED RINGERS IV SOLN
INTRAVENOUS | Status: DC
  Administered 2017-01-17: 10:00:00 via INTRAVENOUS

## 2017-01-17 MED ORDER — FENTANYL CITRATE (PF) 100 MCG/2ML IJ SOLN: 25.0000 ug | INTRAMUSCULAR | Status: DC | PRN

## 2017-01-17 MED ORDER — 0.9 % SODIUM CHLORIDE (POUR BTL) OPTIME
TOPICAL | Status: DC | PRN
  Administered 2017-01-17: 1000 mL

## 2017-01-17 MED ORDER — CEFAZOLIN SODIUM-DEXTROSE 2-4 GM/100ML-% IV SOLN
2.0000 g | INTRAVENOUS | Status: AC
  Administered 2017-01-17: 2 g via INTRAVENOUS

## 2017-01-17 MED ORDER — FENTANYL CITRATE (PF) 100 MCG/2ML IJ SOLN
INTRAMUSCULAR | Status: AC
  Filled 2017-01-17: qty 2

## 2017-01-17 MED ORDER — PROPOFOL 10 MG/ML IV BOLUS
INTRAVENOUS | Status: AC
  Filled 2017-01-17: qty 20

## 2017-01-17 MED ORDER — MIDAZOLAM HCL 5 MG/5ML IJ SOLN
INTRAMUSCULAR | Status: DC | PRN
  Administered 2017-01-17 (×2): 1 mg via INTRAVENOUS

## 2017-01-17 MED ORDER — MIDAZOLAM HCL 2 MG/2ML IJ SOLN
INTRAMUSCULAR | Status: AC
  Filled 2017-01-17: qty 2

## 2017-01-17 MED ORDER — FENTANYL CITRATE (PF) 100 MCG/2ML IJ SOLN
INTRAMUSCULAR | Status: DC | PRN
  Administered 2017-01-17 (×2): 25 ug via INTRAVENOUS

## 2017-01-17 MED ORDER — CHLORHEXIDINE GLUCONATE 4 % EX LIQD: 60.0000 mL | Freq: Once | CUTANEOUS | Status: DC

## 2017-01-17 MED ORDER — PROPOFOL 500 MG/50ML IV EMUL
INTRAVENOUS | Status: DC | PRN
  Administered 2017-01-17: 11:00:00 via INTRAVENOUS
  Administered 2017-01-17: 35 ug/kg/min via INTRAVENOUS

## 2017-01-17 MED ORDER — CEFAZOLIN SODIUM-DEXTROSE 2-4 GM/100ML-% IV SOLN
INTRAVENOUS | Status: AC
  Filled 2017-01-17: qty 100

## 2017-01-17 MED ORDER — MIDAZOLAM HCL 2 MG/2ML IJ SOLN
1.0000 mg | INTRAMUSCULAR | Status: AC
  Administered 2017-01-17: 2 mg via INTRAVENOUS

## 2017-01-17 MED ORDER — FENTANYL CITRATE (PF) 100 MCG/2ML IJ SOLN
25.0000 ug | Freq: Once | INTRAMUSCULAR | Status: AC
  Administered 2017-01-17: 25 ug via INTRAVENOUS

## 2017-01-17 MED ORDER — HYDROCODONE-ACETAMINOPHEN 5-325 MG PO TABS: 1.0000 | ORAL_TABLET | ORAL | 0 refills | Status: DC | PRN

## 2017-01-17 MED ORDER — LIDOCAINE HCL (PF) 0.5 % IJ SOLN
INTRAMUSCULAR | Status: DC | PRN
  Administered 2017-01-17: 250 mg via INTRAVENOUS

## 2017-01-17 SURGICAL SUPPLY — 43 items
BAG HAMPER (MISCELLANEOUS) ×2 IMPLANT
BANDAGE ELASTIC 3 LF NS (GAUZE/BANDAGES/DRESSINGS) ×2 IMPLANT
BANDAGE ESMARK 4X12 BL STRL LF (DISPOSABLE) ×1 IMPLANT
BLADE OSC/SAGITTAL MD 5.5X18 (BLADE) ×2 IMPLANT
BLADE SURG 15 STRL LF DISP TIS (BLADE) ×1 IMPLANT
BLADE SURG 15 STRL SS (BLADE) ×1
BNDG COHESIVE 4X5 TAN STRL (GAUZE/BANDAGES/DRESSINGS) ×2 IMPLANT
BNDG ESMARK 4X12 BLUE STRL LF (DISPOSABLE) ×2
BNDG GAUZE ELAST 4 BULKY (GAUZE/BANDAGES/DRESSINGS) ×2 IMPLANT
CHLORAPREP W/TINT 26ML (MISCELLANEOUS) ×2 IMPLANT
CLOTH BEACON ORANGE TIMEOUT ST (SAFETY) ×2 IMPLANT
COVER LIGHT HANDLE STERIS (MISCELLANEOUS) ×4 IMPLANT
COVER X RAY CASSETTE (MISCELLANEOUS) ×2 IMPLANT
CUFF TOURNIQUET SINGLE 18IN (TOURNIQUET CUFF) ×2 IMPLANT
DECANTER SPIKE VIAL GLASS SM (MISCELLANEOUS) ×2 IMPLANT
DRAPE PROXIMA HALF (DRAPES) ×2 IMPLANT
DRAPE X-RAY CASS 24X20 (DRAPES) ×2 IMPLANT
DRSG XEROFORM 1X8 (GAUZE/BANDAGES/DRESSINGS) ×2 IMPLANT
ELECT NEEDLE TIP 2.8 STRL (NEEDLE) ×2 IMPLANT
ELECT REM PT RETURN 9FT ADLT (ELECTROSURGICAL) ×2
ELECTRODE REM PT RTRN 9FT ADLT (ELECTROSURGICAL) ×1 IMPLANT
GAUZE SPONGE 4X4 12PLY STRL (GAUZE/BANDAGES/DRESSINGS) ×2 IMPLANT
GLOVE BIOGEL M 7.0 STRL (GLOVE) ×2 IMPLANT
GLOVE BIOGEL PI IND STRL 7.0 (GLOVE) ×2 IMPLANT
GLOVE BIOGEL PI INDICATOR 7.0 (GLOVE) ×2
GLOVE SKINSENSE NS SZ8.0 LF (GLOVE) ×1
GLOVE SKINSENSE STRL SZ8.0 LF (GLOVE) ×1 IMPLANT
GLOVE SS N UNI LF 8.5 STRL (GLOVE) ×2 IMPLANT
GOWN STRL REUS W/ TWL LRG LVL3 (GOWN DISPOSABLE) ×2 IMPLANT
GOWN STRL REUS W/TWL LRG LVL3 (GOWN DISPOSABLE) ×2
GOWN STRL REUS W/TWL XL LVL3 (GOWN DISPOSABLE) ×2 IMPLANT
HAND ALUMI XLG (SOFTGOODS) ×2 IMPLANT
KIT ROOM TURNOVER APOR (KITS) ×2 IMPLANT
MANIFOLD NEPTUNE II (INSTRUMENTS) ×2 IMPLANT
NEEDLE HYPO 21X1.5 SAFETY (NEEDLE) ×2 IMPLANT
NS IRRIG 1000ML POUR BTL (IV SOLUTION) ×2 IMPLANT
PACK BASIC LIMB (CUSTOM PROCEDURE TRAY) ×2 IMPLANT
PAD ARMBOARD 7.5X6 YLW CONV (MISCELLANEOUS) ×2 IMPLANT
SET BASIN LINEN APH (SET/KITS/TRAYS/PACK) ×2 IMPLANT
STAPLER VISISTAT 35W (STAPLE) ×2 IMPLANT
SUT MON AB 2-0 SH 27 (SUTURE) ×2
SUT MON AB 2-0 SH27 (SUTURE) ×2 IMPLANT
SYR CONTROL 10ML LL (SYRINGE) ×2 IMPLANT

## 2017-01-17 NOTE — Transfer of Care (Signed)
Immediate Anesthesia Transfer of Care Note  Patient: ORVILL COULTHARD  Procedure(s) Performed: LEFT CARPAL TUNNEL RELEASE (Left ) LEFT ULNAR HEAD RESECTION (Left )  Patient Location: PACU  Anesthesia Type:Bier block  Level of Consciousness: drowsy and patient cooperative  Airway & Oxygen Therapy: Patient Spontanous Breathing and Patient connected to face mask oxygen  Post-op Assessment: Report given to RN, Post -op Vital signs reviewed and stable and Patient moving all extremities  Post vital signs: Reviewed and stable  Last Vitals:  Vitals:   01/17/17 1025 01/17/17 1030  BP: (!) 167/73   Pulse:    Resp: 17 (!) 22  Temp:    SpO2: 97% 93%    Last Pain:  Vitals:   01/17/17 0808  TempSrc: Oral      Patients Stated Pain Goal: 5 (82/50/03 7048)  Complications: No apparent anesthesia complications

## 2017-01-17 NOTE — Anesthesia Procedure Notes (Signed)
Anesthesia Regional Block: Bier block (IV Regional)   Pre-Anesthetic Checklist: ,, timeout performed, Correct Patient, Correct Site, Correct Laterality, Correct Procedure,, site marked, surgical consent,, at surgeon's request  Laterality: Left     Needles:  Injection technique: Single-shot  Needle Type: Other      Needle Gauge: 20     Additional Needles:   Procedures:,,,,, intact distal pulses, Esmarch exsanguination, single tourniquet utilized, #20gu IV placed   Nerve Stimulator or Paresthesia:   Additional Responses:  Pulse checked post tourniquet inflation. IV NSL discontinued post injection. Narrative:  Start time: 01/17/2017 10:43 AM  Performed by: Personally  CRNA: Charmaine Downs, CRNA

## 2017-01-17 NOTE — Op Note (Signed)
01/17/2017  11:49 AM  PATIENT:  [name]  76 y.o. male  PRE-OPERATIVE DIAGNOSIS:  carpal tunnel syndrome and  ulnar carpal impingement  POST-OPERATIVE DIAGNOSIS:  carpal tunnel syndrome and  ulnar carpal impingement  PROCEDURE:  Procedure(s): LEFT CARPAL TUNNEL RELEASE (Left) LEFT ULNAR HEAD RESECTION (Left)   Carpal tunnel release left wrist  Preop diagnosis carpal tunnel syndrome left wrist   postop diagnosis same  Procedure open carpal tunnel release left wrist Surgeon Aline Brochure  Anesthesia regional Bier block  Operative findings compression of the left median nerve without any anterior carpal tunnel masses   Indications failure of conservative treatment to relieve pain and paresthesias and numbness and tingling of the left hand  The patient was identified in the preop area we confirm the surgical site marked as left wrist. Chart update completed. Patient taken to surgery. he had 2 g of Ancef. After establishing a Bier block left her arm was prepped and sterilely draped  Timeout executed completed and confirmed site.  A straight incision was made over the left carpal tunnel in line with the radial border of the ring finger. Blunt dissection was carried out to find the distal aspect of the carpal tunnel. A blunted instrument was passed beneath the carpal tunnel. Sharp incision was then used to release the transverse carpal ligament. The contents of the carpal tunnel were inspected. The median nerve was compressed with slight discoloration.  The wound was irrigated and then closed with 3-0 nylon suture. We injected 10 mL of plain Marcaine on the radial side of the incision  A sterile bandage was applied and the tourniquet was released the color of the hand and capillary refill were normal   Procedure #2  The arm was pronated a longitudinal incision was made over the distal ulna subcutaneous tissue was divided down to the extensor tendon which was retracted.   Subperiosteal dissection exposed the distal ulna and ulnar styloid  Oscillating saw was used to remove a portion of the distal ulna preserving the ulnar styloid.  X-ray was obtained.  I then took another half centimeter bone for a total of 1-1/2 cm  Wound was irrigated and closed in layered fashion with 0 Monocryl suture and staples   The patient was taken to the recovery room in stable condition  SURGEON:  Surgeon(s) and Role:    * Aline Brochure, Tim Lair, MD - Primary  PHYSICIAN ASSISTANT:   ASSISTANTS: betty ashley    ANESTHESIA:   regional  EBL:  3 mL   BLOOD ADMINISTERED:none  DRAINS: none   LOCAL MEDICATIONS USED:  MARCAINE     SPECIMEN:  No Specimen  DISPOSITION OF SPECIMEN:  [name]  COUNTS:  YES  TOURNIQUET:   Total Tourniquet Time Documented: Upper Arm (Left) - 63 minutes Total: Upper Arm (Left) - 63 minutes   DICTATION: .Viviann Spare Dictation  PLAN OF CARE: Discharge to home after PACU  PATIENT DISPOSITION:  PACU - hemodynamically stable.   Delay start of Pharmacological VTE agent (>24hrs) due to surgical blood loss or risk of bleeding: not applicable

## 2017-01-17 NOTE — Anesthesia Postprocedure Evaluation (Signed)
Anesthesia Post Note  Patient: [name]  Procedure(s) Performed: LEFT CARPAL TUNNEL RELEASE (Left ) LEFT ULNAR HEAD RESECTION (Left )  Patient location during evaluation: PACU Anesthesia Type: Regional Level of consciousness: awake and patient cooperative Pain management: pain level controlled Vital Signs Assessment: post-procedure vital signs reviewed and stable Respiratory status: spontaneous breathing, nonlabored ventilation and respiratory function stable Cardiovascular status: blood pressure returned to baseline Postop Assessment: no apparent nausea or vomiting Anesthetic complications: no     Last Vitals:  Vitals:   01/17/17 1030 01/17/17 1200  BP:  (!) 150/69  Pulse:  (!) 53  Resp: (!) 22 15  Temp:  36.8 C  SpO2: 93% 95%    Last Pain:  Vitals:   01/17/17 0808  TempSrc: Oral                 March Steyer J

## 2017-01-17 NOTE — Anesthesia Preprocedure Evaluation (Signed)
Anesthesia Evaluation  Patient identified by MRN, date of birth, ID band Patient awake    Reviewed: Allergy & Precautions, NPO status , Patient's Chart, lab work & pertinent test results, reviewed documented beta blocker date and time   Airway Mallampati: III  TM Distance: >3 FB Neck ROM: Full    Dental  (+) Edentulous Upper, Edentulous Lower   Pulmonary asthma , sleep apnea and Continuous Positive Airway Pressure Ventilation , COPD, former smoker,    breath sounds clear to auscultation       Cardiovascular hypertension, Pt. on medications and Pt. on home beta blockers + CAD   Rhythm:Regular Rate:Normal     Neuro/Psych    GI/Hepatic hiatal hernia, PUD, GERD  ,  Endo/Other  diabetes (pre-DM)Morbid obesity  Renal/GU Renal disease     Musculoskeletal  (+) Arthritis ,   Abdominal   Peds  Hematology   Anesthesia Other Findings   Reproductive/Obstetrics                             Anesthesia Physical Anesthesia Plan  ASA: III  Anesthesia Plan: Regional and Bier Block and Bier Block-LIDOCAINE ONLY   Post-op Pain Management:    Induction: Intravenous  PONV Risk Score and Plan:   Airway Management Planned: Simple Face Mask  Additional Equipment:   Intra-op Plan:   Post-operative Plan:   Informed Consent: I have reviewed the patients History and Physical, chart, labs and discussed the procedure including the risks, benefits and alternatives for the proposed anesthesia with the patient or authorized representative who has indicated his/her understanding and acceptance.     Plan Discussed with:   Anesthesia Plan Comments:         Anesthesia Quick Evaluation

## 2017-01-17 NOTE — Brief Op Note (Signed)
01/17/2017  11:49 AM  PATIENT:  [name]  76 y.o. male  PRE-OPERATIVE DIAGNOSIS:  carpal tunnel syndrome and  ulnar carpal impingement  POST-OPERATIVE DIAGNOSIS:  carpal tunnel syndrome and  ulnar carpal impingement  PROCEDURE:  Procedure(s): LEFT CARPAL TUNNEL RELEASE (Left) LEFT ULNAR HEAD RESECTION (Left)   Carpal tunnel release left wrist  Preop diagnosis carpal tunnel syndrome left wrist   postop diagnosis same  Procedure open carpal tunnel release left wrist Surgeon Aline Brochure  Anesthesia regional Bier block  Operative findings compression of the left median nerve without any anterior carpal tunnel masses   Indications failure of conservative treatment to relieve pain and paresthesias and numbness and tingling of the left hand  The patient was identified in the preop area we confirm the surgical site marked as left wrist. Chart update completed. Patient taken to surgery. he had 2 g of Ancef. After establishing a Bier block left her arm was prepped and sterilely draped  Timeout executed completed and confirmed site.  A straight incision was made over the left carpal tunnel in line with the radial border of the ring finger. Blunt dissection was carried out to find the distal aspect of the carpal tunnel. A blunted instrument was passed beneath the carpal tunnel. Sharp incision was then used to release the transverse carpal ligament. The contents of the carpal tunnel were inspected. The median nerve was compressed with slight discoloration.  The wound was irrigated and then closed with 3-0 nylon suture. We injected 10 mL of plain Marcaine on the radial side of the incision  A sterile bandage was applied and the tourniquet was released the color of the hand and capillary refill were normal   Procedure #2  The arm was pronated a longitudinal incision was made over the distal ulna subcutaneous tissue was divided down to the extensor tendon which was retracted.   Subperiosteal dissection exposed the distal ulna and ulnar styloid  Oscillating saw was used to remove a portion of the distal ulna preserving the ulnar styloid.  X-ray was obtained.  I then took another half centimeter bone for a total of 1-1/2 cm  Wound was irrigated and closed in layered fashion with 0 Monocryl suture and staples   The patient was taken to the recovery room in stable condition  SURGEON:  Surgeon(s) and Role:    * Aline Brochure, Tim Lair, MD - Primary  PHYSICIAN ASSISTANT:   ASSISTANTS: betty ashley    ANESTHESIA:   regional  EBL:  3 mL   BLOOD ADMINISTERED:none  DRAINS: none   LOCAL MEDICATIONS USED:  MARCAINE     SPECIMEN:  No Specimen  DISPOSITION OF SPECIMEN:  [name]  COUNTS:  YES  TOURNIQUET:   Total Tourniquet Time Documented: Upper Arm (Left) - 63 minutes Total: Upper Arm (Left) - 63 minutes   DICTATION: .Viviann Spare Dictation  PLAN OF CARE: Discharge to home after PACU  PATIENT DISPOSITION:  PACU - hemodynamically stable.   Delay start of Pharmacological VTE agent (>24hrs) due to surgical blood loss or risk of bleeding: not applicable

## 2017-01-17 NOTE — Discharge Instructions (Signed)
Hand Exercises Hand exercises can be helpful to almost anyone. These exercises can strengthen the hands, improve flexibility and movement, and increase blood flow to the hands. These results can make work and daily tasks easier. Hand exercises can be especially helpful for people who have joint pain from arthritis or have nerve damage from overuse (carpal tunnel syndrome). These exercises can also help people who have injured a hand. Most of these hand exercises are fairly gentle stretching routines. You can do them often throughout the day. Still, it is a good idea to ask your health care provider which exercises would be best for you. Warming your hands before exercise may help to reduce stiffness. You can do this with gentle massage or by placing your hands in warm water for 15 minutes. Also, make sure you pay attention to your level of hand pain as you begin an exercise routine. Exercises Knuckle Bend Repeat this exercise 5-10 times with each hand. 1. Stand or sit with your arm, hand, and all five fingers pointed straight up. Make sure your wrist is straight. 2. Gently and slowly bend your fingers down and inward until the tips of your fingers are touching the tops of your palm. 3. Hold this position for a few seconds. 4. Extend your fingers out to their original position, all pointing straight up again.  Finger Fan Repeat this exercise 5-10 times with each hand. 1. Hold your arm and hand out in front of you. Keep your wrist straight. 2. Squeeze your hand into a fist. 3. Hold this position for a few seconds. 4. Edison Simon out, or spread apart, your hand and fingers as much as possible, stretching every joint fully.  Tabletop Repeat this exercise 5-10 times with each hand. 1. Stand or sit with your arm, hand, and all five fingers pointed straight up. Make sure your wrist is straight. 2. Gently and slowly bend your fingers at the knuckles where they meet the hand until your hand is making an  upside-down L shape. Your fingers should form a tabletop. 3. Hold this position for a few seconds. 4. Extend your fingers out to their original position, all pointing straight up again.  Making Os Repeat this exercise 5-10 times with each hand. 1. Stand or sit with your arm, hand, and all five fingers pointed straight up. Make sure your wrist is straight. 2. Make an O shape by touching your pointer finger to your thumb. Hold for a few seconds. Then open your hand wide. 3. Repeat this motion with each finger on your hand.  Table Spread Repeat this exercise 5-10 times with each hand. 1. Place your hand on a table with your palm facing down. Make sure your wrist is straight. 2. Spread your fingers out as much as possible. Hold this position for a few seconds. 3. Slide your fingers back together again. Hold for a few seconds.  Ball Grip  Repeat this exercise 10-15 times with each hand. 1. Hold a tennis ball or another soft ball in your hand. 2. While slowly increasing pressure, squeeze the ball as hard as possible. 3. Squeeze as hard as you can for 3-5 seconds. 4. Relax and repeat.  Wrist Curls Repeat this exercise 10-15 times with each hand. 1. Sit in a chair that has armrests. 2. Hold a light weight in your hand, such as a dumbbell that weighs 1-3 pounds (0.5-1.4 kg). Ask your health care provider what weight would be best for you. 3. Rest your hand just  over the end of the chair arm with your palm facing up. 4. Gently pivot your wrist up and down while holding the weight. Do not twist your wrist from side to side.  Contact a health care provider if:  Your hand pain or discomfort gets much worse when you do an exercise.  Your hand pain or discomfort does not improve within 2 hours after you exercise. If you have any of these problems, stop doing these exercises right away. Do not do them again unless your health care provider says that you can. Get help right away if:  You  develop sudden, severe hand pain. If this happens, stop doing these exercises right away. Do not do them again unless your health care provider says that you can. This information is not intended to replace advice given to you by your health care provider. Make sure you discuss any questions you have with your health care provider. Document Released: 01/03/2015 Document Revised: 06/30/2015 Document Reviewed: 08/02/2014 Elsevier Interactive Patient Education  2018 [place] After Refer to this sheet in the next few weeks. These instructions provide you with information about caring for yourself after your procedure. Your health care provider may also give you more specific instructions. Your treatment has been planned according to current medical practices, but problems sometimes occur. Call your health care provider if you have any problems or questions after your procedure. What can I expect after the procedure? After your procedure, it is typical to have the following:  Pain.  Numbness.  Tingling.  Swelling.  Stiffness.  Bruising.  Follow these instructions at home:  Take medicines only as directed by your health care provider.  There are many different ways to close and cover an incision, including stitches (sutures), skin glue, and adhesive strips. Follow your health care provider's instructions about: ? Incision care. ? Bandage (dressing) changes and removal. ? Incision closure removal.  Wear a splint or a brace as directed by your surgeon. You may need to do this for 2-3 weeks.  Keep your hand raised (elevated) above the level of your heart while you are resting. Move your fingers often.  Avoid activities that cause hand pain.  Ask your surgeon when you can start to do all of your usual activities again, such as: ? Driving. ? Returning to work. ? Bathing and swimming.  Keep all follow-up visits as directed by your health care provider.  This is important. You may need physical therapy for several months to speed healing and regain movement. Contact a health care provider if:  You have drainage, redness, swelling, or pain at your incision site.  You have a fever.  You have chills.  Your pain medicine is not working.  Your symptoms do not go away after 2 months.  Your symptoms go away and then return. Get help right away if:  You have pain or numbness that is getting worse.  Your fingers change color.  You are not able to move your fingers. This information is not intended to replace advice given to you by your health care provider. Make sure you discuss any questions you have with your health care provider. Document Released: 08/11/2004 Document Revised: 06/30/2015 Document Reviewed: 09/09/2013 Elsevier Interactive Patient Education  2017 [employer].

## 2017-01-17 NOTE — Interval H&P Note (Signed)
History and Physical Interval Note:  01/17/2017 9:09 AM   BP 137/64   Pulse (!) 57   Temp 97.6 F (36.4 C) (Oral)   Resp (!) 21   Ht 6' 1"  (1.854 m)   Wt 291 lb (132 kg)   SpO2 93%   BMI 38.39 kg/m   CBC Latest Ref Rng & Units 01/15/2017 02/29/2016 02/07/2016  WBC 4.0 - 10.5 K/uL 8.6 10.3 13.4(H)  Hemoglobin 13.0 - 17.0 g/dL 14.7 15.1 14.2  Hematocrit 39.0 - 52.0 % 47.1 46.6 42.4  Platelets 150 - 400 K/uL 260 300 246    BMP Latest Ref Rng & Units 01/15/2017 08/07/2016 02/07/2016  Glucose 65 - 99 mg/dL 93 123(H) 111(H)  BUN 6 - 20 mg/dL 14 11 12   Creatinine 0.61 - 1.24 mg/dL 0.86 1.00 1.05  BUN/Creat Ratio 10 - 24 - 11 11  Sodium 135 - 145 mmol/L 140 146(H) 146(H)  Potassium 3.5 - 5.1 mmol/L 3.9 4.1 3.9  Chloride 101 - 111 mmol/L 106 104 106  CO2 22 - 32 mmol/L 25 25 25   Calcium 8.9 - 10.3 mg/dL 9.3 9.4 8.6    CT  FINDINGS: Bones/Joint/Cartilage   There is a healed previously comminuted fracture of the distal left radius with residual dorsal impaction deformity. There is a residual small linear defect in the articular cortical surface of the distal radius. There is a typical nonunion fracture of the ulnar styloid.   There is joint space narrowing between the scaphoid and radial styloid consistent with arthritis. Carpal bone alignment is normal.   Diffuse osteopenia.   Slight widening of the scapholunate distance which could represent damage to the scapholunate ligament.   Moderate arthritis of the first [facility] joint.   Effusions in the midcarpal joint and radiocarpal joint.   IMPRESSION: Healed fracture of the distal left radius with residual dorsal impaction deformity.   Slight arthritic changes of the radiocarpal joint.   Nonunion avulsion of the ulnar styloid.   Midcarpal and radiocarpal joint effusions, nonspecific.   Slight widening of the scapholunate distance which could represent disruption of the scapholunate ligament.     Electronically Signed  By: Lorriane Shire M.D.   On: 12/07/2016 09:11    KAILON TREESE  has presented today for surgery, with the diagnosis of carpal tunnel syndrome ulno carpal impingement  The various methods of treatment have been discussed with the patient and family. After consideration of risks, benefits and other options for treatment, the patient has consented to  Procedure(s): CARPAL TUNNEL RELEASE (Left) ULNAR HEAD EXCISION-left (Left) as a surgical intervention .  The patient's history has been reviewed, patient examined, no change in status, stable for surgery.  I have reviewed the patient's chart and labs.  Questions were answered to the patient's satisfaction.     Arther Abbott

## 2017-01-18 ENCOUNTER — Encounter (HOSPITAL_COMMUNITY): Payer: Self-pay | Admitting: Orthopedic Surgery

## 2017-01-24 ENCOUNTER — Encounter: Payer: Self-pay | Admitting: *Deleted

## 2017-01-24 DIAGNOSIS — S52532A Colles' fracture of left radius, initial encounter for closed fracture: Secondary | ICD-10-CM | POA: Diagnosis not present

## 2017-01-24 DIAGNOSIS — Z9889 Other specified postprocedural states: Secondary | ICD-10-CM | POA: Diagnosis not present

## 2017-01-25 ENCOUNTER — Ambulatory Visit (INDEPENDENT_AMBULATORY_CARE_PROVIDER_SITE_OTHER): Payer: Medicare HMO | Admitting: Orthopedic Surgery

## 2017-01-25 VITALS — BP 156/95 | HR 70 | Ht 73.0 in | Wt 299.0 lb

## 2017-01-25 DIAGNOSIS — Z9889 Other specified postprocedural states: Secondary | ICD-10-CM

## 2017-01-25 NOTE — Progress Notes (Signed)
Chief Complaint  Patient presents with  . Follow-up    Recheck on left wrist, DOS 01-17-17.    8 status post Darrah procedure status post carpal tunnel release  Patient complains of swelling and tightness of his bandage and splint  His wounds look clean dry and intact most of his pain is over the ulna  Recommend sutures and staples out on the 27th with the nurse and then he will continue splinting and return 4 weeks after that

## 2017-01-31 ENCOUNTER — Ambulatory Visit (INDEPENDENT_AMBULATORY_CARE_PROVIDER_SITE_OTHER): Payer: Medicare HMO | Admitting: Orthopedic Surgery

## 2017-01-31 DIAGNOSIS — Z9889 Other specified postprocedural states: Secondary | ICD-10-CM

## 2017-01-31 NOTE — Progress Notes (Signed)
Patient returns for staple removal over distal ulna and suture removal from Carpal Tunnel Release. Staples and sutures removed without difficulty. Patient tolerated well. He will return to see Dr Aline Brochure in four weeks, sooner if pain increases. I have encouraged, ice, elevation and ROM of fingers to decrease swelling.

## 2017-01-31 NOTE — Patient Instructions (Signed)
Move your fingers and use ice.

## 2017-02-02 DIAGNOSIS — G4733 Obstructive sleep apnea (adult) (pediatric): Secondary | ICD-10-CM | POA: Diagnosis not present

## 2017-02-08 ENCOUNTER — Encounter: Payer: Self-pay | Admitting: Internal Medicine

## 2017-02-08 DIAGNOSIS — H6121 Impacted cerumen, right ear: Secondary | ICD-10-CM | POA: Diagnosis not present

## 2017-02-08 DIAGNOSIS — Z23 Encounter for immunization: Secondary | ICD-10-CM | POA: Diagnosis not present

## 2017-02-15 ENCOUNTER — Ambulatory Visit: Payer: Medicare HMO | Admitting: Family Medicine

## 2017-02-18 ENCOUNTER — Ambulatory Visit (INDEPENDENT_AMBULATORY_CARE_PROVIDER_SITE_OTHER): Payer: Medicare HMO | Admitting: Family Medicine

## 2017-02-18 ENCOUNTER — Encounter: Payer: Self-pay | Admitting: Family Medicine

## 2017-02-18 VITALS — BP 138/70 | Temp 98.3°F | Ht 73.0 in | Wt 292.0 lb

## 2017-02-18 DIAGNOSIS — R351 Nocturia: Secondary | ICD-10-CM

## 2017-02-18 DIAGNOSIS — I10 Essential (primary) hypertension: Secondary | ICD-10-CM | POA: Diagnosis not present

## 2017-02-18 DIAGNOSIS — E785 Hyperlipidemia, unspecified: Secondary | ICD-10-CM

## 2017-02-18 MED ORDER — ZOSTER VAC RECOMB ADJUVANTED 50 MCG/0.5ML IM SUSR: 0.5000 mL | Freq: Once | INTRAMUSCULAR | 0 refills | Status: AC

## 2017-02-18 NOTE — Patient Instructions (Signed)
As part of today's visit a referral has been made. This is a process that is handled by our clinical referral specialists. This process requires that we send your medical information to the specialists for their review before they will issue you an appointment. Unfortunately this does take time and much of this process is under the responsibility of the specialists. For emergent referrals we do our best to speed up this process.Our referral specialist will make certain that your insurance company is notified as well as the physician group that we are referring you to for your problem. Emergent referrals are made as quick as possible. Most standard referrals often take 10 days before we hear from the specialists office when they can see you. If you have not heard when your appointment is from [place] or the referral specialists within 10 days please call us regarding this referral.

## 2017-02-18 NOTE — Progress Notes (Signed)
   Subjective:    Patient ID: [name], male    DOB: [birth year], 77 y.o.   MRN: [id number]  Hypertension  This is a chronic problem. Pertinent negatives include no chest pain, headaches or shortness of breath.  Takes his medicine on a regular basis tries to watch his diet suffers with obesity Frequent urination for about 6 years. Pt states he cannot get a urine sample today because he had surgery on his hand a few weeks ago.  The patient states at nighttime he has a difficult time getting a good stream going but during the daytime it does better denies hematuria.  Review of Systems  Constitutional: Negative for activity change, fatigue and fever.  Respiratory: Negative for cough and shortness of breath.   Cardiovascular: Negative for chest pain and leg swelling.  Neurological: Negative for headaches.       Objective:   Physical Exam  Constitutional: He appears well-nourished. No distress.  Cardiovascular: Normal rate, regular rhythm and normal heart sounds.  No murmur heard. Pulmonary/Chest: Effort normal and breath sounds normal. No respiratory distress.  Musculoskeletal: He exhibits no edema.  Lymphadenopathy:    He has no cervical adenopathy.  Neurological: He is alert.  Psychiatric: His behavior is normal.  Vitals reviewed.         Assessment & Plan:  HTN- Patient was seen today as part of a visit regarding hypertension. The importance of healthy diet and regular physical activity was discussed. The importance of compliance with medications discussed. Ideal goal is to keep blood pressure low elevated levels certainly below 797/28 when possible. The patient was counseled that keeping blood pressure under control lessen his risk of heart attack, stroke, kidney failure, and early death. The importance of regular follow-ups was discussed with the patient. Low-salt diet such as DASH recommended. Regular physical activity was recommended as well. Patient was advised to keep  regular follow-ups.  The patient was seen today as part of an evaluation regarding hyperlipidemia. Recent lab work has been reviewed with the patient as well as the goals for good cholesterol care. In addition to this medications have been discussed the importance of compliance with diet and medications discussed as well. Patient has been informed of potential side effects of medications in the importance to notify us should any problems occur. Finally the patient is aware that poor control of cholesterol, noncompliance can dramatically increase her risk of heart attack strokes and premature death. The patient will keep regular office visits and the patient does agreed to periodic lab work.  Patient having significant frequency of urination at night he is taking Cardura in the evening 4 mg I believe the patient would benefit from being seen by urology for evaluation of BPH and possibility of even surgical treatment-referral to urology

## 2017-02-19 ENCOUNTER — Encounter: Payer: Medicare HMO | Attending: Family Medicine | Admitting: Nutrition

## 2017-02-19 ENCOUNTER — Encounter: Payer: Self-pay | Admitting: Nutrition

## 2017-02-19 VITALS — Wt 292.0 lb

## 2017-02-19 DIAGNOSIS — R739 Hyperglycemia, unspecified: Secondary | ICD-10-CM

## 2017-02-19 DIAGNOSIS — I2510 Atherosclerotic heart disease of native coronary artery without angina pectoris: Secondary | ICD-10-CM

## 2017-02-19 DIAGNOSIS — I10 Essential (primary) hypertension: Secondary | ICD-10-CM

## 2017-02-19 NOTE — Progress Notes (Signed)
Medical Nutrition Therapy:  Appt start time: 1400 end time:  1430  Assessment:  Primary concerns today: Overweight, Prediabetes and elevated TG's.  Sleeps in recliner . Lost 7 lbs. Doesn'tt eat when not hungry. Only 2 meals per day. Still not eating breakfast. Now eating more salads, vegetables. Cut out snacks. Limited income to afford a lot of fruits and vegetables. Drinking a lot more water.  His daughter has bought [employer] some.  Recently got hearing aids put in. Has a CPAP machine but doesn't use it all the time. Hasn't been reevaluated since he got it. He notes it helps some. Sleeps in a recliner. Has a waterbed and can't get in and out of it for sleeping. Can't afford a new mattress. Says he watches TV and naps most of the day. Feels better overall. Can bend down now much easier. Clothes are fitting better and needs a belt on his pants.        Making slow adjustments with his diet. Mobility limited. His left wrist is still healing and complains of pain in it often.  Lab Results  Component Value Date   HGBA1C 5.8 11/18/2014   CMP Latest Ref Rng & Units 01/15/2017 08/07/2016 02/07/2016  Glucose 65 - 99 mg/dL 93 123(H) 111(H)  BUN 6 - 20 mg/dL 14 11 12   Creatinine 0.61 - 1.24 mg/dL 0.86 1.00 1.05  Sodium 135 - 145 mmol/L 140 146(H) 146(H)  Potassium 3.5 - 5.1 mmol/L 3.9 4.1 3.9  Chloride 101 - 111 mmol/L 106 104 106  CO2 22 - 32 mmol/L 25 25 25   Calcium 8.9 - 10.3 mg/dL 9.3 9.4 8.6  Total Protein 6.0 - 8.5 g/dL - 7.3 6.5  Total Bilirubin 0.0 - 1.2 mg/dL - 0.5 0.8  Alkaline Phos 39 - 117 IU/L - 92 90  AST 0 - 40 IU/L - 39 20  ALT 0 - 44 IU/L - 34 20    Lipid Panel     Component Value Date/Time   CHOL 171 08/07/2016 0847   TRIG 230 (H) 08/07/2016 0847   HDL 49 08/07/2016 0847   CHOLHDL 3.5 08/07/2016 0847   CHOLHDL 3.2 06/08/2015 0907   VLDL 33 (H) 06/08/2015 0907   LDLCALC 76 08/07/2016 0847   Preferred Learning Style:    No preference indicated    Learning Readiness:   Ready  Change in progress   MEDICATIONS: ADL   DIETARY INTAKE: B) Egg, cheese and sausage croissant and water L) Boiled chicken,, onions, 7 layer of salad, and egg custard D)  Can't remember. Usual physical activity: ADL  Estimated energy needs: 1800  calories 200 g carbohydrates 135 g protein 50 g fat  Progress Towards Goal(s):  In progress.   Nutritional Diagnosis:  NI-1.5 Excessive energy intake As related to HIgh Fat high Salt HIgh Calorie Diet.  As evidenced by Obesity, BMI > 40 and Prediabetes.    Intervention:  Nutrition. Predidabetes and Weight Loss education provided on My Plate, CHO counting, meal planning, portion sizes, timing of meals, avoiding snacks between meals , target ranges for A1C and blood sugars, taking medications as prescribed, benefits of exercising 30 minutes per day and prevention of  DM. High Fiber Low Fat Low Sodium Diet   Goals 1.  Try to eat breakfast daily. 2. Eat 2 pieces of fruit a day. 3. Increase more vegetables. 4. Keep drinking water. Increase fiber. Cut out processed meats.  Eat breakfast 6-8/ L) 12-2 pm and D) 5-7 pm  Teaching Method Utilized:  Visual Auditory Hands on  Handouts given during visit include:  The Plate Method  Meal Plan Card Pre-Diabetes Instructions Weight loss tips Barriers to learning/adherence to lifestyle change: none  Demonstrated degree of understanding via:  Teach Back   Monitoring/Evaluation:  Dietary intake, exercise, meal planning, and body weight in 6 months. Consider rechecking A1C and referral for re-evaluating his CPAP machine.

## 2017-02-19 NOTE — Patient Instructions (Addendum)
Goals 1.  Try to eat breakfast daily. 2. Eat 2 pieces of fruit a day. 3. Increase more vegetables. 4. Keep drinking water. Increase fiber. Cut out processed meats.

## 2017-02-20 DIAGNOSIS — R609 Edema, unspecified: Secondary | ICD-10-CM | POA: Diagnosis not present

## 2017-02-20 DIAGNOSIS — I252 Old myocardial infarction: Secondary | ICD-10-CM | POA: Diagnosis not present

## 2017-02-20 DIAGNOSIS — I2510 Atherosclerotic heart disease of native coronary artery without angina pectoris: Secondary | ICD-10-CM | POA: Diagnosis not present

## 2017-02-20 DIAGNOSIS — Z6838 Body mass index (BMI) 38.0-38.9, adult: Secondary | ICD-10-CM | POA: Diagnosis not present

## 2017-02-20 DIAGNOSIS — N3281 Overactive bladder: Secondary | ICD-10-CM | POA: Diagnosis not present

## 2017-02-20 DIAGNOSIS — Z7982 Long term (current) use of aspirin: Secondary | ICD-10-CM | POA: Diagnosis not present

## 2017-02-20 DIAGNOSIS — E785 Hyperlipidemia, unspecified: Secondary | ICD-10-CM | POA: Diagnosis not present

## 2017-02-20 DIAGNOSIS — R32 Unspecified urinary incontinence: Secondary | ICD-10-CM | POA: Diagnosis not present

## 2017-02-20 DIAGNOSIS — I10 Essential (primary) hypertension: Secondary | ICD-10-CM | POA: Diagnosis not present

## 2017-02-20 DIAGNOSIS — E669 Obesity, unspecified: Secondary | ICD-10-CM | POA: Diagnosis not present

## 2017-02-22 ENCOUNTER — Encounter: Payer: Self-pay | Admitting: Family Medicine

## 2017-02-26 DIAGNOSIS — G4733 Obstructive sleep apnea (adult) (pediatric): Secondary | ICD-10-CM | POA: Diagnosis not present

## 2017-02-28 ENCOUNTER — Other Ambulatory Visit: Payer: Self-pay | Admitting: Family Medicine

## 2017-03-01 ENCOUNTER — Encounter: Payer: Self-pay | Admitting: Orthopedic Surgery

## 2017-03-01 ENCOUNTER — Ambulatory Visit (INDEPENDENT_AMBULATORY_CARE_PROVIDER_SITE_OTHER): Payer: Self-pay | Admitting: Orthopedic Surgery

## 2017-03-01 VITALS — BP 169/71 | HR 60 | Ht 72.0 in | Wt 290.0 lb

## 2017-03-01 DIAGNOSIS — Z9889 Other specified postprocedural states: Secondary | ICD-10-CM

## 2017-03-01 DIAGNOSIS — Z4889 Encounter for other specified surgical aftercare: Secondary | ICD-10-CM

## 2017-03-01 MED ORDER — PREDNISONE 10 MG PO TABS: 10.0000 mg | ORAL_TABLET | Freq: Every day | ORAL | 0 refills | Status: DC

## 2017-03-01 NOTE — Progress Notes (Signed)
POST OP APPT.  Chief Complaint  Patient presents with  . Post-op Follow-up    01/17/17 left wrist CTR and ulnar head resection   43 days postop from a left carpal tunnel release and ulnar head resection for malunion of distal radius fracture  Patient says he has soreness over his incision at the tip of the ring and long finger still have a little numbness in the is been taking Tylenol has not been helping a lot      BP (!) 169/71   Pulse 60   Ht 6' (1.829 m)   Wt 290 lb (131.5 kg)   BMI 39.33 kg/m  Physical Exam  Constitutional: He is oriented to person, place, and time. He appears well-developed and well-nourished.  Musculoskeletal:       Arms: Neurological: He is alert and oriented to person, place, and time.   Encounter Diagnoses  Name Primary?  . History of carpal tunnel surgery of left wrist with unlar head resection 01/17/17   . Aftercare following surgery Yes   Recommend 10 mg prednisone daily normal activity follow-up in 6 weeks

## 2017-03-03 NOTE — Telephone Encounter (Signed)
Because this medication has been listed as discontinued but it is still on epic list.  To my knowledge I have not discontinued this.  Please verify with the patient is taking both he may have 6 months of each

## 2017-03-04 ENCOUNTER — Other Ambulatory Visit: Payer: Self-pay

## 2017-03-04 MED ORDER — PRAVASTATIN SODIUM 40 MG PO TABS: 40.0000 mg | ORAL_TABLET | Freq: Every day | ORAL | 5 refills | Status: DC

## 2017-03-04 MED ORDER — METOPROLOL TARTRATE 50 MG PO TABS: 150.0000 mg | ORAL_TABLET | Freq: Every day | ORAL | 1 refills | Status: DC

## 2017-03-04 NOTE — Telephone Encounter (Signed)
I would recommend calling the patient finding out from him is he still taking the medication or is he taking a different cholesterol medicine?  Thank you

## 2017-03-05 DIAGNOSIS — G4733 Obstructive sleep apnea (adult) (pediatric): Secondary | ICD-10-CM | POA: Diagnosis not present

## 2017-03-05 NOTE — Telephone Encounter (Signed)
I called and left a message,asked that he r/c.

## 2017-03-06 NOTE — Telephone Encounter (Signed)
I called and left a message to r/c. 

## 2017-04-09 ENCOUNTER — Ambulatory Visit: Payer: Medicare HMO | Admitting: Urology

## 2017-04-09 DIAGNOSIS — N390 Urinary tract infection, site not specified: Secondary | ICD-10-CM | POA: Diagnosis not present

## 2017-04-09 DIAGNOSIS — R351 Nocturia: Secondary | ICD-10-CM | POA: Diagnosis not present

## 2017-04-09 DIAGNOSIS — N3281 Overactive bladder: Secondary | ICD-10-CM

## 2017-04-09 DIAGNOSIS — R3915 Urgency of urination: Secondary | ICD-10-CM

## 2017-04-12 ENCOUNTER — Encounter: Payer: Self-pay | Admitting: Orthopedic Surgery

## 2017-04-12 ENCOUNTER — Ambulatory Visit (INDEPENDENT_AMBULATORY_CARE_PROVIDER_SITE_OTHER): Payer: Medicare HMO | Admitting: Orthopedic Surgery

## 2017-04-12 VITALS — BP 119/80 | HR 67 | Ht 72.0 in | Wt 290.0 lb

## 2017-04-12 DIAGNOSIS — Z9889 Other specified postprocedural states: Secondary | ICD-10-CM

## 2017-04-12 NOTE — Progress Notes (Signed)
POST OP VISIT   Patient ID: [name], male   DOB: [birth year], 77 y.o.   MRN: [id number]  Chief Complaint  Patient presents with  . Post-op Follow-up    left wrist s/p CTR with ulnar head resection 01/17/17    Encounter Diagnosis  Name Primary?  . History of carpal tunnel surgery of left wrist with ulnar head resection 01/17/17 Yes   Darrah procedure and carpal tunnel release  C/O dorsal wrist pain left ulnar side of the wrist some pain at the ulnocarpal joint as well.  However his carpal tunnel symptoms have resolved  He said he is wearing a glove which controls his pain at this point.  I offered him an injection he declined at this point he would like to see how things go over 6 weeks

## 2017-05-21 ENCOUNTER — Ambulatory Visit: Payer: Medicare HMO | Admitting: Urology

## 2017-05-21 DIAGNOSIS — G4733 Obstructive sleep apnea (adult) (pediatric): Secondary | ICD-10-CM | POA: Diagnosis not present

## 2017-06-04 ENCOUNTER — Encounter: Payer: Self-pay | Admitting: Orthopedic Surgery

## 2017-06-04 ENCOUNTER — Ambulatory Visit (INDEPENDENT_AMBULATORY_CARE_PROVIDER_SITE_OTHER): Payer: Medicare HMO | Admitting: Orthopedic Surgery

## 2017-06-04 VITALS — BP 121/65 | HR 65 | Ht 72.0 in | Wt 290.0 lb

## 2017-06-04 DIAGNOSIS — Z9889 Other specified postprocedural states: Secondary | ICD-10-CM | POA: Diagnosis not present

## 2017-06-04 NOTE — Progress Notes (Signed)
Progress Note   Patient ID: [name], male   DOB: 1940-03-29, 77 y.o.   MRN: [id number]  Chief Complaint  Patient presents with  . Hand Pain    wrist hand still painful      Medical decision-making Encounter Diagnosis  Name Primary?  . History of carpal tunnel surgery of left wrist with unlar head resection 01/17/17 Yes      No orders of the defined types were placed in this encounter.    PLAN: [name] did not want injection.  He said that a wrist sleeve/wrap helps him the most, I think he has some ulnar nerve symptoms at this time.  He also has arthritis on x-ray and CT scan of the scapholunate area with some widening  He did not want any further treatment Continue with over-the-counter Tylenol or Advil   Chief Complaint  Patient presents with  . Hand Pain    wrist hand still painful     HPI  Status post distal radius fracture right wrist left wrist, numbness of the long finger.  Pain over the dorsum of the joint Review of Systems  Gastrointestinal: Positive for diarrhea.   Current Meds  Medication Sig  . amLODipine (NORVASC) 5 MG tablet Take 1 tablet (5 mg total) by mouth daily.  Marland Kitchen aspirin 81 MG tablet Take 162 mg by mouth daily.    Marland Kitchen doxazosin (CARDURA) 8 MG tablet Take 4 mg by mouth daily.  . furosemide (LASIX) 40 MG tablet Take 40 mg by mouth daily.  Marland Kitchen losartan (COZAAR) 25 MG tablet Take 1 tablet (25 mg total) by mouth daily.  . metoprolol tartrate (LOPRESSOR) 50 MG tablet Take 3 tablets (150 mg total) by mouth at bedtime. TAKE ONE TABLET BY MOUTH ONCE DAILY IN THE MORNING AND  2  TABLETS  AT  BEDTIME  . nitroGLYCERIN (NITROSTAT) 0.4 MG SL tablet Place 0.4 mg under the tongue every 5 (five) minutes as needed for chest pain.  Marland Kitchen OVER THE COUNTER MEDICATION Omega XL 4 tablets at night  . pravastatin (PRAVACHOL) 40 MG tablet Take 1 tablet (40 mg total) by mouth daily.  . valsartan (DIOVAN) 160 MG tablet Take 160 mg by mouth daily.    No Known  Allergies   BP 121/65   Pulse 65   Ht 6' (1.829 m)   Wt 290 lb (131.5 kg)   BMI 39.33 kg/m   Physical Exam  Constitutional: He is oriented to person, place, and time. He appears well-developed and well-nourished.  Vital signs have been reviewed and are stable. Gen. appearance the patient is well-developed and well-nourished with normal grooming and hygiene.   Musculoskeletal:       Left wrist: He exhibits tenderness.       Arms: Neurological: He is alert and oriented to person, place, and time.  Skin: Skin is warm and dry. No erythema.  Psychiatric: He has a normal mood and affect.  Vitals reviewed.      Arther Abbott, MD 06/04/2017 2:45 PM

## 2017-06-18 ENCOUNTER — Ambulatory Visit (INDEPENDENT_AMBULATORY_CARE_PROVIDER_SITE_OTHER): Payer: Medicare HMO | Admitting: Family Medicine

## 2017-06-18 ENCOUNTER — Encounter: Payer: Self-pay | Admitting: Family Medicine

## 2017-06-18 VITALS — BP 136/82 | Ht 72.0 in | Wt 291.2 lb

## 2017-06-18 DIAGNOSIS — R351 Nocturia: Secondary | ICD-10-CM | POA: Diagnosis not present

## 2017-06-18 DIAGNOSIS — R197 Diarrhea, unspecified: Secondary | ICD-10-CM | POA: Diagnosis not present

## 2017-06-18 DIAGNOSIS — K5190 Ulcerative colitis, unspecified, without complications: Secondary | ICD-10-CM

## 2017-06-18 DIAGNOSIS — N401 Enlarged prostate with lower urinary tract symptoms: Secondary | ICD-10-CM | POA: Diagnosis not present

## 2017-06-18 DIAGNOSIS — I10 Essential (primary) hypertension: Secondary | ICD-10-CM | POA: Diagnosis not present

## 2017-06-18 NOTE — Progress Notes (Signed)
   Subjective:    Patient ID: [name], male    DOB: 1940/09/13, 77 y.o.   MRN: [id number]  Hypertension  This is a chronic problem. The current episode started more than 1 year ago. Pertinent negatives include no chest pain, headaches or shortness of breath. Risk factors for coronary artery disease include dyslipidemia and male gender. Treatments tried: lopressor, norvasc, cozaar, cardura, lasix, diovan. There are no compliance problems.    Patient wants to discuss crones disease -sees Dr Gala Romney and food allergies. Recently patient has not been able to be on any medications for colitis He cannot afford his medications Having severe issues with bloody mucousy stools Patient has not seen gastroenterology recently Patient depressed because of severe colitis issues  Review of Systems  Constitutional: Negative for activity change, appetite change and fatigue.  HENT: Negative for congestion and rhinorrhea.   Respiratory: Negative for cough, chest tightness and shortness of breath.   Cardiovascular: Negative for chest pain and leg swelling.  Gastrointestinal: Positive for abdominal pain, blood in stool and diarrhea. Negative for nausea.  Endocrine: Negative for polydipsia and polyphagia.  Genitourinary: Negative for dysuria and hematuria.  Neurological: Negative for weakness and headaches.  Psychiatric/Behavioral: Negative for confusion and dysphoric mood.       Objective:   Physical Exam  Constitutional: He appears well-nourished. No distress.  HENT:  Head: Normocephalic and atraumatic.  Eyes: Right eye exhibits no discharge. Left eye exhibits no discharge.  Cardiovascular: Normal rate, regular rhythm and normal heart sounds.  No murmur heard. Pulmonary/Chest: Effort normal and breath sounds normal. No respiratory distress.  Abdominal: He exhibits no distension. There is no tenderness. There is no guarding.  Musculoskeletal: He exhibits no edema.  Lymphadenopathy:    He has  no cervical adenopathy.  Neurological: He is alert. He exhibits normal muscle tone.  Skin: Skin is warm and dry.  Psychiatric: His behavior is normal.  Vitals reviewed.    25 minutes was spent with the patient.  This statement verifies that 25 minutes was indeed spent with the patient. Greater than half the time was spent in discussion, counseling and answering questions  regarding the issues that the patient came in for today as reflected in the diagnosis (s) please refer to documentation for further details.  Time was spent discussing multiple issues including colitis depression hypertension and BPH greater than half and answering multiple questions    Assessment & Plan:  Severe colitis Needs to get back in with gastroenterology Patient has BPH cannot remember all of his medicines cannot remember what urology is doing for him Urology will be sending [place] hopefully their notes Blood pressure stable Repeat of lab work on metabolic 7 necessary Tissue transglutaminase to rule out celiac disease CBC to look at inflammation white blood count and hemoglobin in the face of his severe colitis Will get patient back in with gastroenterology Hopefully they have either way of getting the pharmaceutical company to assist him with his medicines  Major depression- mainly related to his situation he states if his situation gets better his moods will get better

## 2017-06-20 ENCOUNTER — Encounter: Payer: Self-pay | Admitting: Family Medicine

## 2017-06-20 ENCOUNTER — Other Ambulatory Visit: Payer: Self-pay | Admitting: Family Medicine

## 2017-06-20 DIAGNOSIS — K51919 Ulcerative colitis, unspecified with unspecified complications: Secondary | ICD-10-CM

## 2017-06-20 NOTE — Progress Notes (Signed)
Referral placed.

## 2017-06-21 LAB — CBC WITH DIFFERENTIAL/PLATELET
Basophils Absolute: 0.1 10*3/uL (ref 0.0–0.2)
Basos: 1 %
EOS (ABSOLUTE): 0.3 10*3/uL (ref 0.0–0.4)
Eos: 3 %
Hematocrit: 45.5 % (ref 37.5–51.0)
Hemoglobin: 15.2 g/dL (ref 13.0–17.7)
Immature Grans (Abs): 0 10*3/uL (ref 0.0–0.1)
Immature Granulocytes: 0 %
Lymphocytes Absolute: 4 10*3/uL — ABNORMAL HIGH (ref 0.7–3.1)
Lymphs: 36 %
MCH: 30.3 pg (ref 26.6–33.0)
MCHC: 33.4 g/dL (ref 31.5–35.7)
MCV: 91 fL (ref 79–97)
Monocytes Absolute: 1 10*3/uL — ABNORMAL HIGH (ref 0.1–0.9)
Monocytes: 9 %
Neutrophils Absolute: 5.6 10*3/uL (ref 1.4–7.0)
Neutrophils: 51 %
Platelets: 280 10*3/uL (ref 150–379)
RBC: 5.02 x10E6/uL (ref 4.14–5.80)
RDW: 14.1 % (ref 12.3–15.4)
WBC: 10.9 10*3/uL — ABNORMAL HIGH (ref 3.4–10.8)

## 2017-06-21 LAB — BASIC METABOLIC PANEL
BUN/Creatinine Ratio: 15 (ref 10–24)
BUN: 14 mg/dL (ref 8–27)
CO2: 21 mmol/L (ref 20–29)
Calcium: 9.8 mg/dL (ref 8.6–10.2)
Chloride: 104 mmol/L (ref 96–106)
Creatinine, Ser: 0.91 mg/dL (ref 0.76–1.27)
GFR calc Af Amer: 93 mL/min/{1.73_m2} (ref >59)
GFR calc non Af Amer: 80 mL/min/{1.73_m2} (ref >59)
Glucose: 109 mg/dL — ABNORMAL HIGH (ref 65–99)
Potassium: 4.3 mmol/L (ref 3.5–5.2)
Sodium: 148 mmol/L — ABNORMAL HIGH (ref 134–144)

## 2017-06-21 LAB — TISSUE TRANSGLUTAMINASE, IGA: Transglutaminase IgA: <2 U/mL (ref 0–3)

## 2017-06-26 ENCOUNTER — Encounter: Payer: Self-pay | Admitting: Internal Medicine

## 2017-06-27 ENCOUNTER — Telehealth: Payer: Self-pay | Admitting: Gastroenterology

## 2017-06-27 NOTE — Telephone Encounter (Signed)
Please reach out to patient. He was referred back to [place] for colitis and inability to afford his medication and will need help getting assistance.   I see he is scheduled with Dr. Gala Romney in 09/2017.   If he is okay with seeing an APP, let's get him in sooner, may use [facility] as he is an established patient. Thanks!Marland Kitchen

## 2017-07-02 ENCOUNTER — Telehealth: Payer: Self-pay | Admitting: Family Medicine

## 2017-07-02 NOTE — Telephone Encounter (Signed)
Called patient and left message for him to call here.

## 2017-07-02 NOTE — Telephone Encounter (Signed)
Pt called here to check on GI referral  [facility] has him scheduled & they [facility] for pt so that they can get him in sooner  I also tried to call pt back to have him call [place] & had to [facility]

## 2017-07-02 NOTE — Telephone Encounter (Signed)
Called pt again, explained that [place] left a message for pt to call & have appt moved up, pt verbalized understanding & states he will call [facility]

## 2017-07-03 ENCOUNTER — Encounter: Payer: Self-pay | Admitting: Gastroenterology

## 2017-07-03 ENCOUNTER — Ambulatory Visit (INDEPENDENT_AMBULATORY_CARE_PROVIDER_SITE_OTHER): Payer: Medicare HMO | Admitting: Gastroenterology

## 2017-07-03 ENCOUNTER — Telehealth: Payer: Self-pay | Admitting: *Deleted

## 2017-07-03 ENCOUNTER — Other Ambulatory Visit: Payer: Self-pay

## 2017-07-03 VITALS — BP 130/64 | HR 70 | Temp 97.7°F | Ht 73.0 in | Wt 285.8 lb

## 2017-07-03 DIAGNOSIS — K51511 Left sided colitis with rectal bleeding: Secondary | ICD-10-CM | POA: Diagnosis not present

## 2017-07-03 NOTE — Telephone Encounter (Signed)
[name] called to talk with the nurse to go over blood work results for patient. Please advise

## 2017-07-03 NOTE — Telephone Encounter (Signed)
Lab results discussed with daughter who is on DPR/HIPPA for patient.  Daughter verbalized understanding.

## 2017-07-03 NOTE — Progress Notes (Signed)
Referring Provider: [name], MD Primary Care Physician:  [name], MD  Primary GI: Dr. Gala Romney   Chief Complaint  Patient presents with  . Diarrhea    "slimy" at times will have blood in it  . Abdominal Pain  . Gas    "a lot if it"    HPI:   [name] is a 77 y.o. male presenting today with a history of left-sided proctocolitis, last seen in Aug 2018. He had seen Dr. Eduard Roux at [facility] several years ago, undergoing EGD, colonoscopy, anorectal manometry in 2016. Endoscopically was felt to have well localized proctocolitis limited to the sigmoid. Biopsies of colon demonstrated no abnormality, and the rectum demonstrated moderately active proctitis. EGD with negative small bowel biopsies. Anorectal manometry with some concern for possible fissure, and he had pelvic floor dyssynergy. Biofeedback recommended but not completed.    He has been on  balsalazide 750 mg 3 tabs TID in the past. He did not respond well to Lialda. Rowasa enemas tried in the past, with recommendations to transition to [facility] if improvement in symptoms.   Celiac serologies: TTg, IgA negative. Needs IgA.   Started having mucus-like stools since Christmas. Will have alternating Bristol stool scale #1 and #7 "rabbit balls and watery stool". Will have uncontrollable gas. Can't go anywhere or do anything. If takes a "diarrheal pill" (Imodium), will do better than if he takes nothing. Will sometimes pass gas and have blood and mucus mixed. 81 mg aspirin. Currently not on maintenance medication. From what I can gather, cost has been an issue. He did not take Uceris in the past due to 400$ cost. It is unclear how long he has been off of maintenance medication, but he was doing well at both the August and Jan 2018 visits last year, so therapy was not restarted. Non-compliance has been an issue historically due to medication cost in the past.    Past Medical History:  Diagnosis Date  . Asthma   .  BPH (benign prostatic hyperplasia)   . CAD (coronary artery disease) 01/1995  . Clostridium difficile colitis 04/2005  . Colitis 2011  . COPD (chronic obstructive pulmonary disease) ([place])   . Diverticulosis   . DJD (degenerative joint disease)   . Gastric ulcer 04/17/10   Three 64m gastric ulcers, H.pylori serologies were negative  . GERD (gastroesophageal reflux disease)   . History of kidney stones   . Hyperlipidemia   . Hypertension   . Idiopathic chronic inflammatory bowel disease 05/18/2010   left-sided UC  . Kidney stone   . Obstructive sleep apnea   . Reflux 02/1995  . S/P endoscopy 07/24/10   retained gastric contents, benign bx    Past Surgical History:  Procedure Laterality Date  . ANORECTAL MANOMETRY  2016   [facility]: concern for possible fissure. Noted pelvic floor dyssnyergy  . CARDIAC CATHETERIZATION     with stent  . CARDIOVASCULAR STRESS TEST  07/21/2009   No scintigraphic evidence of inducible myocardial ischemia  . CARPAL TUNNEL RELEASE Left 01/17/2017   Procedure: LEFT CARPAL TUNNEL RELEASE;  Surgeon: HCarole Civil MD;  Location: [facility];  Service: Orthopedics;  Laterality: Left;  . CATARACT EXTRACTION, BILATERAL Bilateral   . CERVICAL SPINE SURGERY     C4-5  . COLONOSCOPY  04/2005   granularity and friability erosions from rectum to 40cm. Bx infection vs IBD. C. Diff positive at the time.   . COLONOSCOPY  05/2010   [facility]:  left-sided UC, bx with no dysplasia, shallow diverticula  . COLONOSCOPY [name] 11/07/2012   BCW:[id number]-[id number] proctocolitis status post segmental biopsy/Sigmoid colon polyps removed as described above. Procedure compromisd by technical difficulties. bx: Inflammation limited to sigmoid and rectum on pathology.  . COLONOSCOPY  04/2014   Dr. Nyoka Cowden at [facility]: well localized proctocolitis limited to sigmoid  . CORONARY STENT PLACEMENT  01/1995  . ESOPHAGOGASTRODUODENOSCOPY  07/24/2010   [facility] esophagus  . ESOPHAGOGASTRODUODENOSCOPY  04/2014     Dr. Nyoka Cowden at [facility]: negative small bowel biopsies  . FOOT SURGERY  two  . KNEE SURGERY  two  . SHOULDER SURGERY  two  . TONSILLECTOMY    . TRANSTHORACIC ECHOCARDIOGRAM  03/23/2009   EF 60-65%, normal LV systolic function  . ULNAR HEAD EXCISION Left 01/17/2017   Procedure: LEFT ULNAR HEAD RESECTION;  Surgeon: Carole Civil, MD;  Location: [facility];  Service: Orthopedics;  Laterality: Left;    Current Outpatient Medications  Medication Sig Dispense Refill  . amLODipine (NORVASC) 5 MG tablet Take 1 tablet (5 mg total) by mouth daily. 30 tablet 5  . aspirin 81 MG tablet Take 81 mg by mouth daily.     Marland Kitchen doxazosin (CARDURA) 8 MG tablet Take 4 mg by mouth daily.    . furosemide (LASIX) 40 MG tablet Take 40 mg by mouth daily.    Marland Kitchen losartan (COZAAR) 25 MG tablet Take 1 tablet (25 mg total) by mouth daily. 30 tablet 5  . metoprolol tartrate (LOPRESSOR) 50 MG tablet Take 3 tablets (150 mg total) by mouth at bedtime. TAKE ONE TABLET BY MOUTH ONCE DAILY IN THE MORNING AND  2  TABLETS  AT  BEDTIME 270 tablet 1  . nitroGLYCERIN (NITROSTAT) 0.4 MG SL tablet Place 0.4 mg under the tongue every 5 (five) minutes as needed for chest pain.    Marland Kitchen OVER THE COUNTER MEDICATION Omega XL 4 tablets at night    . pravastatin (PRAVACHOL) 40 MG tablet Take 1 tablet (40 mg total) by mouth daily. 30 tablet 5   No current facility-administered medications for this visit.     Allergies as of 07/03/2017  . (No Known Allergies)    Family History  Problem Relation Age of Onset  . Lung cancer Mother   . Cancer Mother        breast  . Diabetes Mother   . Stroke Father   . Hypertension Father   . Colon cancer Neg Hx     Social History   Socioeconomic History  . Marital status: Widowed    Spouse name: Not on file  . Number of children: Not on file  . Years of education: Not on file  . Highest education level: Not on file  Occupational History  . Occupation: maintenance    Comment: retired  Photographer  . Financial resource strain: Not on file  . Food insecurity:    Worry: Not on file    Inability: Not on file  . Transportation needs:    Medical: Not on file    Non-medical: Not on file  Tobacco Use  . Smoking status: Former Smoker    Packs/day: 1.00    Years: 20.00    Pack years: 20.00    Types: Cigarettes    Last attempt to quit: 09/30/1969    Years since quitting: 47.8  . Smokeless tobacco: Never Used  Substance and Sexual Activity  . Alcohol use: No  . Drug use: No  . Sexual activity: Yes  Partners: Female    Birth control/protection: None    Comment: spouse  Lifestyle  . Physical activity:    Days per week: Not on file    Minutes per session: Not on file  . Stress: Not on file  Relationships  . Social connections:    Talks on phone: Not on file    Gets together: Not on file    Attends religious service: Not on file    Active member of club or organization: Not on file    Attends meetings of clubs or organizations: Not on file    Relationship status: Not on file  Other Topics Concern  . Not on file  Social History Narrative  . Not on file    Review of Systems: Gen: Denies fever, chills, anorexia. Denies fatigue, weakness, weight loss.  CV: Denies chest pain, palpitations, syncope, peripheral edema, and claudication. Resp: Denies dyspnea at rest, cough, wheezing, coughing up blood, and pleurisy. GI: see HPI  Derm: Denies rash, itching, dry skin Psych: Denies depression, anxiety, memory loss, confusion. No homicidal or suicidal ideation.  Heme: see HPI   Physical Exam: BP 130/64   Pulse 70   Temp 97.7 F (36.5 C) (Oral)   Ht 6' 1"  (1.854 m)   Wt 285 lb 12.8 oz (129.6 kg)   BMI 37.71 kg/m  General:   Alert and oriented. No distress noted. Pleasant and cooperative.  Head:  Normocephalic and atraumatic. Eyes:  Conjuctiva clear without scleral icterus. Mouth:  Oral mucosa pink and moist.  Lungs: scattered rhonchi bilaterally Cardiac: S1 S2  present without murmurs  Abdomen:  +BS, soft, non-tender and non-distended. No rebound or guarding. No HSM or masses noted. Msk:  Symmetrical without gross deformities. Normal posture. Extremities:  Without edema. Neurologic:  Alert and  oriented x4 Psych:  Alert and cooperative. Normal mood and affect.  Lab Results  Component Value Date   WBC 10.9 (H) 06/18/2017   HGB 15.2 06/18/2017   HCT 45.5 06/18/2017   MCV 91 06/18/2017   PLT 280 06/18/2017   Lab Results  Component Value Date   ALT 34 08/07/2016   AST 39 08/07/2016   ALKPHOS 92 08/07/2016   BILITOT 0.5 08/07/2016   Lab Results  Component Value Date   CREATININE 0.91 06/18/2017   BUN 14 06/18/2017   NA 148 (H) 06/18/2017   K 4.3 06/18/2017   CL 104 06/18/2017   CO2 21 06/18/2017

## 2017-07-03 NOTE — Patient Instructions (Signed)
We have enough samples of Uceris for almost a week. Take one a day, and I am getting in contact with the company to find out how we can do the rest. If for some reason there is no way to get help for financial assistance, we will need to do prednisone.   We are arranging a colonoscopy in the near future.  Following the colonoscopy, we can decide the best maintenance therapy for you.  Please have blood work done today.   Please call if any worsening of symptoms.   It was a pleasure to see you today. I strive to create trusting relationships with patients to provide genuine, compassionate, and quality care. I value your feedback. If you receive a survey regarding your visit,  I greatly appreciate you taking time to fill this out.   Annitta Needs, PhD, ANP-BC [facility]

## 2017-07-04 ENCOUNTER — Telehealth: Payer: Self-pay

## 2017-07-04 LAB — C-REACTIVE PROTEIN: CRP: 32 mg/L — ABNORMAL HIGH (ref 0.0–4.9)

## 2017-07-04 LAB — SEDIMENTATION RATE: Sed Rate: 42 mm/hr — ABNORMAL HIGH (ref 0–30)

## 2017-07-04 LAB — IGA: IgA/Immunoglobulin A, Serum: 415 mg/dL (ref 61–437)

## 2017-07-04 NOTE — Telephone Encounter (Signed)
Tried to call pt to inform of pre-op appt 08/16/17 at 12:45pm, no answer, LMOVM. Letter mailed.

## 2017-07-04 NOTE — Telephone Encounter (Signed)
Yes, see different phone note from 5/30

## 2017-07-04 NOTE — Telephone Encounter (Signed)
FYI to Roseanne Kaufman, NP. ( Did you call rep for samples short term?)

## 2017-07-04 NOTE — Telephone Encounter (Signed)
Thanks for moving up colonoscopy.  If he is coughing up blood, this sounds like a pulmonary issue. Needs to contact PCP. He had scattered rhonchi yesterday.   As for Uceris that I started him on, unfortunately the rep does NOT sample this product any longer ( I did not know this yesterday). It is now a generic. Without insurance coverage, his out of pocket expense will be at least 500$ for only 30 pills. This is not sustainable nor do I recommend doing this out of pocket.   He can continue the samples till he finishes, then we can transition to prednisone for one week (limiting the higher dose of prednisone that he would need). Then, we can taper down over a few weeks. The rep was unable to tell me if patient assistance was still available. I feel we should exhaust this before moving to prednisone. Uceris does not have to be tapered, so that's good.    1. Please see if there is any patient assistance available for Uceris. He has 6 capsules, which will take him through Monday. This will give [place] a few days to figure this out. 2. If NO patient assistance, I will then have him start prednisone 20 mg on Tuesday for one week, then 10 mg for one week, then 5 mg for one week.    As for other therapies for UC, I have looked at goodrx and self pay prices. We will need to get creative after colonoscopy and extent of disease is seen. 1. Canasa suppositories average 330$ for month, Rowasa enemas 5 to 600$ for a month 2. Lialda 340 $ for one month 3. Colazal (he had been on this before) around 100$ a month. This looks like the best option in the future, and he had been on this in the past.

## 2017-07-04 NOTE — Telephone Encounter (Signed)
Pt will come by the office and pick up patient assistance forms to fill our for Uceris and get it back to me right away. Forms at front to pick up.

## 2017-07-04 NOTE — Telephone Encounter (Signed)
AB had requested for TCS w/Propofol w/RMR be moved up to June if possible. Had cancellation for 07/29/17. Called pt, TCS rescheduled to 07/29/17 at 10:45am. Endo scheduler informed. Pre-op appt moved to 07/24/17 at 10:00am. Called pt back and informed him of new pre-op. New instructions and appt letter mailed.  Vicente Males, pt wanted to let you know he coughed up "little bit" of blood this morning and feels like he has "burning in his lungs". He started the new medication yesterday that you gave him at [place].

## 2017-07-04 NOTE — Telephone Encounter (Signed)
Pt walked in the office and was given Patient Assistance paperwork by DS. I explained to pt that he needs to see his PCP if he is coughing up blood. Pt stated he just coughed up blood and pt was advised to contact his PCP a second time for ? pulmonary issues. Pt also stated that he has 4 pills left. Pt is aware that he will need to start Prednisone if Patient Assistance isn't approved or doesn't come in an adequate amount of time.

## 2017-07-05 ENCOUNTER — Telehealth: Payer: Self-pay | Admitting: Internal Medicine

## 2017-07-05 NOTE — Telephone Encounter (Signed)
Spoke with pt. Pt is aware that we'll discuss further with AB on Monday June 3. Pt's pt assistant papers are on AB's desk.

## 2017-07-05 NOTE — Telephone Encounter (Signed)
Please call patient about his medication, he only has a few samples left and he brought back his financial assistance paperwork for the meds

## 2017-07-07 ENCOUNTER — Encounter: Payer: Self-pay | Admitting: Gastroenterology

## 2017-07-07 NOTE — Assessment & Plan Note (Addendum)
77 year old male with history of left-sided proctocolitis and last colonoscopy in 2016 at [facility], presenting today with rectal bleeding, increased flatus and bloating, alternating bowel habits from constipation to loose stool. Somewhat difficult historian. Symptoms recurrent as of Christmas. Doubt superimposed infectious process due to lack of persistent diarrhea (and reports of constipation), but I have discussed obtaining stool studies if he has persistent issues. No maintenance medication for quite some time(unclear length) and has historically not been compliant with therapy due to medication cost. He does not have prescription drug coverage. Reviewing epic, he has been off any therapy for over a year but had been doing well without any specific IBD therapy. Now with flare and no NSAIDs except for 81 mg aspirin.  We discussed attempting patient assistance for Uceris, as out of pocket will be unaffordable. I provided a weeks' worth of samples to start, and I spoke with the rep after patient left. Unfortunately, this is now a generic medication, so samples are not provided any longer and we have no further samples available at this office. I have asked staff to expedite patient assistance request.  Rowasa and Canasa therapies are both expensive (Canasa suppositories average 330$ for month, Rowasa enemas 5 to 600$ for a month), Lialda is 340$ per month (no improvement historically with this), Colazal is around 100$ a month self-pay.   We discussed colonoscopy to assess extent of disease. He may be able to remain on prior agent historically used (Colazal), as this would the most cost-effective. Due to cost, we discussed holding off on purchasing this so he does not have to pay out of pocket and will attempt to see if any samples are available in office.   Proceed with TCS with Dr. Gala Romney in near future: the risks, benefits, and alternatives have been discussed with the patient in detail. The patient states  understanding and desires to proceed. Propofol due to polypharmacy  Check Sed rate, CRP, IgA (TTg, IgA already negative, need IgA to exclude any IgA deficiency)  Addendum: elevated CRP and sed rate. Negative celiac serologies. Patient has improved significantly with Uceris samples, but we have no additional samples available.  Spoke with [facility] regarding compounding this. Also discussed need for the extended release budesonide that is equivalent to Uceris, as we need it to act in the colon. Discussed with Dorothea Ogle, the pharmacist. This has been compounded, 9 mg capsules, #30 for 65$. Can taper thereafter as appropriate. Each month will be 65$ regardless of the dosing. Daughter and patient aware of plan. Start PPI daily for GI prophylaxis (history of PUD in remote past).

## 2017-07-08 NOTE — Progress Notes (Signed)
cc'ed to pcp °

## 2017-07-08 NOTE — Telephone Encounter (Signed)
Stamp signatures are not allowed. Once signed, I will call to see if it can be rushed. Previous pt assistance, takes several weeks.

## 2017-07-08 NOTE — Telephone Encounter (Signed)
Faxed paperwork for pt assistance.

## 2017-07-08 NOTE — Telephone Encounter (Signed)
Done

## 2017-07-08 NOTE — Telephone Encounter (Signed)
Has patient assistance been submitted? Please stamp my name or have another provider sign this morning if not: he is out of medication as of today (today is last dose). We need to submit ASAP to see if anything can be done.

## 2017-07-09 ENCOUNTER — Encounter: Payer: Self-pay | Admitting: Internal Medicine

## 2017-07-09 NOTE — Telephone Encounter (Signed)
Spoke with pt and he isn't able to afford $65. Please advise.

## 2017-07-09 NOTE — Telephone Encounter (Signed)
AB, pt and is daughter called to ask about more samples. Please advise on the next plan of action. Pt is inquiring about medication.

## 2017-07-09 NOTE — Telephone Encounter (Signed)
Do we know how long patient assistance will take? Any word?   How is he doing? The only other option we have starting on prednisone with taper. No further samples are available, and the rep does not sample this any longer.

## 2017-07-09 NOTE — Telephone Encounter (Signed)
UPDATE:  [facility] can compound the generic equivalent for Uceris 9 mg, with a capsule that is acid resistant. It will reach the colon. I have spoken with the compounding pharmacy. I would much rather keep him on Uceris than do prednisone.   The cost is this: 65$ for 30 day supply of 9 mg budesonide. It will be 65$ a month for each refill, and they can compound smaller dosages (6 mg, 3 mg) if we want to titrate him down. (normally do not have to wean off of Uceris due to its formulation, but we have this option).   Please let me know, and I will call it into [place].   I also recommend that he be on an OTC PPI like omeprazole or Nexium while on Uceris or Prednisone.

## 2017-07-09 NOTE — Telephone Encounter (Signed)
I spoke with patient. He is feeling much improved with Uceris. I have left a message with his daughter to discuss further.

## 2017-07-10 NOTE — Telephone Encounter (Addendum)
[name] [phone], patient's daughter, returned call.   Will proceed with medication through [facility]. Patient's daughter is aware of plan and will work with patient to obtain medication. I have told her it will likely be 1 month on this with a taper thereafter. Discussed possibility of resuming Colazal, but that will be another outpatient expense that may or may not be helpful in this scenario. Await final colonoscopy findings and decide on long-term therapy as appropriate.    I have called in the generic formulation compounded for Uceris to [facility] and spoke with Dorothea Ogle, the pharmacist. One month supply for now. They will contact patient.   Alicia: please have patient take omeprazole or Nexium once each morning, 30 minutes before breakfast. I can send in to pharmacy if cheaper than over the counter (good rx may have this info)

## 2017-07-10 NOTE — Telephone Encounter (Signed)
Pt notified that he should take  Nexium or Omeprazole 30 mins before breakfast.

## 2017-07-10 NOTE — Telephone Encounter (Signed)
I called daughter's number again, had to leave message. I called patient and asked that his daughter call me.

## 2017-07-11 ENCOUNTER — Ambulatory Visit (INDEPENDENT_AMBULATORY_CARE_PROVIDER_SITE_OTHER): Payer: Medicare HMO | Admitting: Family Medicine

## 2017-07-11 ENCOUNTER — Encounter: Payer: Self-pay | Admitting: Family Medicine

## 2017-07-11 VITALS — BP 118/58 | Ht 72.0 in | Wt 285.8 lb

## 2017-07-11 DIAGNOSIS — E785 Hyperlipidemia, unspecified: Secondary | ICD-10-CM | POA: Diagnosis not present

## 2017-07-11 DIAGNOSIS — I10 Essential (primary) hypertension: Secondary | ICD-10-CM

## 2017-07-11 DIAGNOSIS — R05 Cough: Secondary | ICD-10-CM

## 2017-07-11 DIAGNOSIS — R058 Other specified cough: Secondary | ICD-10-CM

## 2017-07-11 MED ORDER — METOPROLOL TARTRATE 50 MG PO TABS: 150.0000 mg | ORAL_TABLET | Freq: Every day | ORAL | 1 refills | Status: DC

## 2017-07-11 MED ORDER — PRAVASTATIN SODIUM 40 MG PO TABS: 40.0000 mg | ORAL_TABLET | Freq: Every day | ORAL | 5 refills | Status: DC

## 2017-07-11 NOTE — Progress Notes (Signed)
   Subjective:    Patient ID: [name], male    DOB: 23-May-1940, 77 y.o.   MRN: [id number]  HPI Patient arrives for a follow up on colitis. Patient seen NP at Dr Rourke's office. This patient relates that he was seen by gastroenterology they prescribed some medication that [facility] will be compounding then the patient will get started on this and he will see how it goes to help get him under better control he feels like his moods will do so much better if the diarrhea gets under better control he denies any major setbacks he does relate coughing up some phlegm denies any chest tightness pressure pain  Review of Systems Please see above positive for diarrhea positive for watery stools and mucousy stools also positive for coughing up thick phlegm he states occasionally there is a slight tinge of blood in it but he denies any major setbacks in that regards    Objective:   Physical Exam HEENT benign neck no masses lungs clear no crackles heart is regular no murmurs abdomen is soft no guarding       Assessment & Plan:  We will have the patient do a follow-up chest x-ray Hold off on antibiotics because that can have negative impact on his colitis Patient will go by [facility] to see if compounded medicine is ready He will follow through with gastroenterology He will do lab work in the fall with follow-up office visit I encourage patient to bring all of his medicines at that time to make sure that he is on the proper medicines

## 2017-07-11 NOTE — Progress Notes (Signed)
Angie Dtr is aware of all and aware the orders have been placed.

## 2017-07-11 NOTE — Patient Instructions (Signed)
Do labs early Sept  Follow up ov in mid sept  Bring all meds with you

## 2017-07-11 NOTE — Addendum Note (Signed)
Addended by: Karle Barr on: 07/11/2017 04:45 PM   Modules accepted: Orders

## 2017-07-15 ENCOUNTER — Ambulatory Visit (HOSPITAL_COMMUNITY)
Admission: RE | Admit: 2017-07-15 | Discharge: 2017-07-15 | Disposition: A | Discharge status: Home / Self Care | Payer: Medicare HMO | Source: Ambulatory Visit | Attending: Family Medicine | Admitting: Family Medicine

## 2017-07-15 DIAGNOSIS — R058 Other specified cough: Secondary | ICD-10-CM

## 2017-07-15 DIAGNOSIS — R05 Cough: Secondary | ICD-10-CM | POA: Diagnosis not present

## 2017-07-19 NOTE — Patient Instructions (Signed)
[name]  07/19/2017     @PREFPERIOPPHARMACY @   Your procedure is scheduled on  07/29/2017   Report to [facility] at  825   A.M.  Call this number if you have problems the morning of surgery:  [phone]   Remember:  Do not eat or drink after midnight.  You may drink clear liquids until (follow the instructions given to you) .  Clear liquids allowed are:                    Water, Juice (non-citric and without pulp), Carbonated beverages, Clear Tea, Black Coffee only, Plain Jell-O only, Gatorade and Plain Popsicles only    Take these medicines the morning of surgery with A SIP OF WATER  Norvasc, cardura, losartan, metoprolol.    Do not wear jewelry, make-up or nail polish.  Do not wear lotions, powders, or perfumes, or deodorant.  Do not shave 48 hours prior to surgery.  Men may shave face and neck.  Do not bring valuables to the hospital.  [facility] is not responsible for any belongings or valuables.  Contacts, dentures or bridgework may not be worn into surgery.  Leave your suitcase in the car.  After surgery it may be brought to your room.  For patients admitted to the hospital, discharge time will be determined by your treatment team.  Patients discharged the day of surgery will not be allowed to drive home.   Name and phone number of your driver:   family Special instructions:  Follow the diet and prep instructions given to you by Dr Roseanne Kaufman office.  Please read over the following fact sheets that you were given. Anesthesia Post-op Instructions and Care and Recovery After Surgery       Colonoscopy, Adult A colonoscopy is an exam to look at the large intestine. It is done to check for problems, such as:  Lumps (tumors).  Growths (polyps).  Swelling (inflammation).  Bleeding.  What happens before the procedure? Eating and drinking Follow instructions from your doctor about eating and drinking. These instructions may include:  A few  days before the procedure - follow a low-fiber diet. ? Avoid nuts. ? Avoid seeds. ? Avoid dried fruit. ? Avoid raw fruits. ? Avoid vegetables.  1-3 days before the procedure - follow a clear liquid diet. Avoid liquids that have red or purple dye. Drink only clear liquids, such as: ? Clear broth or bouillon. ? Black coffee or tea. ? Clear juice. ? Clear soft drinks or sports drinks. ? Gelatin dessert. ? Popsicles.  On the day of the procedure - do not eat or drink anything during the 2 hours before the procedure.  Bowel prep If you were prescribed an oral bowel prep:  Take it as told by your doctor. Starting the day before your procedure, you will need to drink a lot of liquid. The liquid will cause you to poop (have bowel movements) until your poop is almost clear or light green.  If your skin or butt gets irritated from diarrhea, you may: ? Wipe the area with wipes that have medicine in them, such as adult wet wipes with aloe and vitamin E. ? Put something on your skin that soothes the area, such as petroleum jelly.  If you throw up (vomit) while drinking the bowel prep, take a break for up to 60 minutes. Then begin the bowel prep again. If you keep throwing  up and you cannot take the bowel prep without throwing up, call your doctor.  General instructions  Ask your doctor about changing or stopping your normal medicines. This is important if you take diabetes medicines or blood thinners.  Plan to have someone take you home from the hospital or clinic. What happens during the procedure?  An IV tube may be put into one of your veins.  You will be given medicine to help you relax (sedative).  To reduce your risk of infection: ? Your doctors will wash their hands. ? Your anal area will be washed with soap.  You will be asked to lie on your side with your knees bent.  Your doctor will get a long, thin, flexible tube ready. The tube will have a camera and a light on the  end.  The tube will be put into your anus.  The tube will be gently put into your large intestine.  Air will be delivered into your large intestine to keep it open. You may feel some pressure or cramping.  The camera will be used to take photos.  A small tissue sample may be removed from your body to be looked at under a microscope (biopsy). If any possible problems are found, the tissue will be sent to a lab for testing.  If small growths are found, your doctor may remove them and have them checked for cancer.  The tube that was put into your anus will be slowly removed. The procedure may vary among doctors and hospitals. What happens after the procedure?  Your doctor will check on you often until the medicines you were given have worn off.  Do not drive for 24 hours after the procedure.  You may have a small amount of blood in your poop.  You may pass gas.  You may have mild cramps or bloating in your belly (abdomen).  It is up to you to get the results of your procedure. Ask your doctor, or the department performing the procedure, when your results will be ready. This information is not intended to replace advice given to you by your health care provider. Make sure you discuss any questions you have with your health care provider. Document Released: 02/24/2010 Document Revised: 11/23/2015 Document Reviewed: 04/05/2015 Elsevier Interactive Patient Education  2017 Elsevier Inc[employer]  Colonoscopy, Adult, Care After This sheet gives you information about how to care for yourself after your procedure. Your health care provider may also give you more specific instructions. If you have problems or questions, contact your health care provider. What can I expect after the procedure? After the procedure, it is common to have:  A small amount of blood in your stool for 24 hours after the procedure.  Some gas.  Mild abdominal cramping or bloating.  Follow these instructions at  home: General instructions   For the first 24 hours after the procedure: ? Do not drive or use machinery. ? Do not sign important documents. ? Do not drink alcohol. ? Do your regular daily activities at a slower pace than normal. ? Eat soft, easy-to-digest foods. ? Rest often.  Take over-the-counter or prescription medicines only as told by your health care provider.  It is up to you to get the results of your procedure. Ask your health care provider, or the department performing the procedure, when your results will be ready. Relieving cramping and bloating  Try walking around when you have cramps or feel bloated.  Apply heat to your abdomen  as told by your health care provider. Use a heat source that your health care provider recommends, such as a moist heat pack or a heating pad. ? Place a towel between your skin and the heat source. ? Leave the heat on for 20-30 minutes. ? Remove the heat if your skin turns bright red. This is especially important if you are unable to feel pain, heat, or cold. You may have a greater risk of getting burned. Eating and drinking  Drink enough fluid to keep your urine clear or pale yellow.  Resume your normal diet as instructed by your health care provider. Avoid heavy or fried foods that are hard to digest.  Avoid drinking alcohol for as long as instructed by your health care provider. Contact a health care provider if:  You have blood in your stool 2-3 days after the procedure. Get help right away if:  You have more than a small spotting of blood in your stool.  You pass large blood clots in your stool.  Your abdomen is swollen.  You have nausea or vomiting.  You have a fever.  You have increasing abdominal pain that is not relieved with medicine. This information is not intended to replace advice given to you by your health care provider. Make sure you discuss any questions you have with your health care provider. Document Released:  09/06/2003 Document Revised: 10/17/2015 Document Reviewed: 04/05/2015 Elsevier Interactive Patient Education  2018 [place] Anesthesia is a term that refers to techniques, procedures, and medicines that help a person stay safe and comfortable during a medical procedure. Monitored anesthesia care, or sedation, is one type of anesthesia. Your anesthesia specialist may recommend sedation if you will be having a procedure that does not require you to be unconscious, such as:  Cataract surgery.  A dental procedure.  A biopsy.  A colonoscopy.  During the procedure, you may receive a medicine to help you relax (sedative). There are three levels of sedation:  Mild sedation. At this level, you may feel awake and relaxed. You will be able to follow directions.  Moderate sedation. At this level, you will be sleepy. You may not remember the procedure.  Deep sedation. At this level, you will be asleep. You will not remember the procedure.  The more medicine you are given, the deeper your level of sedation will be. Depending on how you respond to the procedure, the anesthesia specialist may change your level of sedation or the type of anesthesia to fit your needs. An anesthesia specialist will monitor you closely during the procedure. Let your health care provider know about:  Any allergies you have.  All medicines you are taking, including vitamins, herbs, eye drops, creams, and over-the-counter medicines.  Any use of steroids (by mouth or as a cream).  Any problems you or family members have had with sedatives and anesthetic medicines.  Any blood disorders you have.  Any surgeries you have had.  Any medical conditions you have, such as sleep apnea.  Whether you are pregnant or may be pregnant.  Any use of cigarettes, alcohol, or street drugs. What are the risks? Generally, this is a safe procedure. However, problems may occur, including:  Getting too  much medicine (oversedation).  Nausea.  Allergic reaction to medicines.  Trouble breathing. If this happens, a breathing tube may be used to help with breathing. It will be removed when you are awake and breathing on your own.  Heart trouble.  Lung  trouble.  Before the procedure Staying hydrated Follow instructions from your health care provider about hydration, which may include:  Up to 2 hours before the procedure - you may continue to drink clear liquids, such as water, clear fruit juice, black coffee, and plain tea.  Eating and drinking restrictions Follow instructions from your health care provider about eating and drinking, which may include:  8 hours before the procedure - stop eating heavy meals or foods such as meat, fried foods, or fatty foods.  6 hours before the procedure - stop eating light meals or foods, such as toast or cereal.  6 hours before the procedure - stop drinking milk or drinks that contain milk.  2 hours before the procedure - stop drinking clear liquids.  Medicines Ask your health care provider about:  Changing or stopping your regular medicines. This is especially important if you are taking diabetes medicines or blood thinners.  Taking medicines such as aspirin and ibuprofen. These medicines can thin your blood. Do not take these medicines before your procedure if your health care provider instructs you not to.  Tests and exams  You will have a physical exam.  You may have blood tests done to show: ? How well your kidneys and liver are working. ? How well your blood can clot.  General instructions  Plan to have someone take you home from the hospital or clinic.  If you will be going home right after the procedure, plan to have someone with you for 24 hours.  What happens during the procedure?  Your blood pressure, heart rate, breathing, level of pain and overall condition will be monitored.  An IV tube will be inserted into one of  your veins.  Your anesthesia specialist will give you medicines as needed to keep you comfortable during the procedure. This may mean changing the level of sedation.  The procedure will be performed. After the procedure  Your blood pressure, heart rate, breathing rate, and blood oxygen level will be monitored until the medicines you were given have worn off.  Do not drive for 24 hours if you received a sedative.  You may: ? Feel sleepy, clumsy, or nauseous. ? Feel forgetful about what happened after the procedure. ? Have a sore throat if you had a breathing tube during the procedure. ? Vomit. This information is not intended to replace advice given to you by your health care provider. Make sure you discuss any questions you have with your health care provider. Document Released: 10/18/2004 Document Revised: 07/01/2015 Document Reviewed: 05/15/2015 Elsevier Interactive Patient Education  2018 [place], Care After These instructions provide you with information about caring for yourself after your procedure. Your health care provider may also give you more specific instructions. Your treatment has been planned according to current medical practices, but problems sometimes occur. Call your health care provider if you have any problems or questions after your procedure. What can I expect after the procedure? After your procedure, it is common to:  Feel sleepy for several hours.  Feel clumsy and have poor balance for several hours.  Feel forgetful about what happened after the procedure.  Have poor judgment for several hours.  Feel nauseous or vomit.  Have a sore throat if you had a breathing tube during the procedure.  Follow these instructions at home: For at least 24 hours after the procedure:   Do not: ? Participate in activities in which you could fall or become injured. ?  Drive. ? Use heavy machinery. ? Drink alcohol. ? Take sleeping pills  or medicines that cause drowsiness. ? Make important decisions or sign legal documents. ? Take care of children on your own.  Rest. Eating and drinking  Follow the diet that is recommended by your health care provider.  If you vomit, drink water, juice, or soup when you can drink without vomiting.  Make sure you have little or no nausea before eating solid foods. General instructions  Have a responsible adult stay with you until you are awake and alert.  Take over-the-counter and prescription medicines only as told by your health care provider.  If you smoke, do not smoke without supervision.  Keep all follow-up visits as told by your health care provider. This is important. Contact a health care provider if:  You keep feeling nauseous or you keep vomiting.  You feel light-headed.  You develop a rash.  You have a fever. Get help right away if:  You have trouble breathing. This information is not intended to replace advice given to you by your health care provider. Make sure you discuss any questions you have with your health care provider. Document Released: 05/15/2015 Document Revised: 09/14/2015 Document Reviewed: 05/15/2015 Elsevier Interactive Patient Education  [employer].

## 2017-07-24 ENCOUNTER — Other Ambulatory Visit: Payer: Self-pay | Admitting: Family Medicine

## 2017-07-24 ENCOUNTER — Other Ambulatory Visit: Payer: Self-pay

## 2017-07-24 ENCOUNTER — Encounter (HOSPITAL_COMMUNITY)
Admission: RE | Admit: 2017-07-24 | Discharge: 2017-07-24 | Disposition: A | Discharge status: Home / Self Care | Payer: Medicare HMO | Source: Ambulatory Visit | Attending: Internal Medicine | Admitting: Internal Medicine

## 2017-07-24 ENCOUNTER — Encounter (HOSPITAL_COMMUNITY): Payer: Self-pay

## 2017-07-24 DIAGNOSIS — Z01812 Encounter for preprocedural laboratory examination: Secondary | ICD-10-CM | POA: Diagnosis not present

## 2017-07-24 HISTORY — DX: Depression, unspecified: F32.A

## 2017-07-24 HISTORY — DX: Major depressive disorder, single episode, unspecified: F32.9

## 2017-07-24 LAB — CBC WITH DIFFERENTIAL/PLATELET
Basophils Absolute: 0.1 10*3/uL (ref 0.0–0.1)
Basophils Relative: 1 %
Eosinophils Absolute: 0.2 10*3/uL (ref 0.0–0.7)
Eosinophils Relative: 1 %
HCT: 46.3 % (ref 39.0–52.0)
Hemoglobin: 14.8 g/dL (ref 13.0–17.0)
Lymphocytes Relative: 33 %
Lymphs Abs: 4 10*3/uL (ref 0.7–4.0)
MCH: 30.1 pg (ref 26.0–34.0)
MCHC: 32 g/dL (ref 30.0–36.0)
MCV: 94.1 fL (ref 78.0–100.0)
Monocytes Absolute: 1.3 10*3/uL — ABNORMAL HIGH (ref 0.1–1.0)
Monocytes Relative: 11 %
Neutro Abs: 6.7 10*3/uL (ref 1.7–7.7)
Neutrophils Relative %: 54 %
Platelets: 235 10*3/uL (ref 150–400)
RBC: 4.92 MIL/uL (ref 4.22–5.81)
RDW: 13.9 % (ref 11.5–15.5)
WBC: 12.2 10*3/uL — ABNORMAL HIGH (ref 4.0–10.5)

## 2017-07-24 LAB — BASIC METABOLIC PANEL
Anion gap: 7 (ref 5–15)
BUN: 16 mg/dL (ref 6–20)
CO2: 28 mmol/L (ref 22–32)
Calcium: 9.5 mg/dL (ref 8.9–10.3)
Chloride: 109 mmol/L (ref 101–111)
Creatinine, Ser: 0.88 mg/dL (ref 0.61–1.24)
GFR calc Af Amer: >60 mL/min (ref >60)
GFR calc non Af Amer: >60 mL/min (ref >60)
Glucose, Bld: 94 mg/dL (ref 65–99)
Potassium: 3.6 mmol/L (ref 3.5–5.1)
Sodium: 144 mmol/L (ref 135–145)

## 2017-07-24 NOTE — Pre-Procedure Instructions (Signed)
Patient in for PAT. HE has VERY limited neck mobility. A.Adams in to evaluate patient.

## 2017-07-29 ENCOUNTER — Ambulatory Visit (HOSPITAL_COMMUNITY): Payer: Medicare HMO | Admitting: Anesthesiology

## 2017-07-29 ENCOUNTER — Encounter (HOSPITAL_COMMUNITY)
Admission: RE | Disposition: A | Discharge status: Home / Self Care | Payer: Self-pay | Source: Ambulatory Visit | Attending: Internal Medicine

## 2017-07-29 ENCOUNTER — Encounter (HOSPITAL_COMMUNITY): Payer: Self-pay | Admitting: *Deleted

## 2017-07-29 ENCOUNTER — Other Ambulatory Visit: Payer: Self-pay

## 2017-07-29 ENCOUNTER — Ambulatory Visit (HOSPITAL_COMMUNITY)
Admission: RE | Admit: 2017-07-29 | Discharge: 2017-07-29 | Disposition: A | Discharge status: Home / Self Care | Payer: Medicare HMO | Source: Ambulatory Visit | Attending: Internal Medicine | Admitting: Internal Medicine

## 2017-07-29 ENCOUNTER — Telehealth: Payer: Self-pay | Admitting: Family Medicine

## 2017-07-29 DIAGNOSIS — E785 Hyperlipidemia, unspecified: Secondary | ICD-10-CM | POA: Diagnosis not present

## 2017-07-29 DIAGNOSIS — Z955 Presence of coronary angioplasty implant and graft: Secondary | ICD-10-CM | POA: Diagnosis not present

## 2017-07-29 DIAGNOSIS — D120 Benign neoplasm of cecum: Secondary | ICD-10-CM | POA: Diagnosis not present

## 2017-07-29 DIAGNOSIS — J449 Chronic obstructive pulmonary disease, unspecified: Secondary | ICD-10-CM | POA: Insufficient documentation

## 2017-07-29 DIAGNOSIS — I2510 Atherosclerotic heart disease of native coronary artery without angina pectoris: Secondary | ICD-10-CM | POA: Insufficient documentation

## 2017-07-29 DIAGNOSIS — K6289 Other specified diseases of anus and rectum: Secondary | ICD-10-CM | POA: Diagnosis not present

## 2017-07-29 DIAGNOSIS — Z79899 Other long term (current) drug therapy: Secondary | ICD-10-CM | POA: Diagnosis not present

## 2017-07-29 DIAGNOSIS — D125 Benign neoplasm of sigmoid colon: Secondary | ICD-10-CM | POA: Diagnosis not present

## 2017-07-29 DIAGNOSIS — K5140 Inflammatory polyps of colon without complications: Secondary | ICD-10-CM | POA: Diagnosis not present

## 2017-07-29 DIAGNOSIS — G4733 Obstructive sleep apnea (adult) (pediatric): Secondary | ICD-10-CM | POA: Diagnosis not present

## 2017-07-29 DIAGNOSIS — F329 Major depressive disorder, single episode, unspecified: Secondary | ICD-10-CM | POA: Diagnosis not present

## 2017-07-29 DIAGNOSIS — K921 Melena: Secondary | ICD-10-CM | POA: Diagnosis not present

## 2017-07-29 DIAGNOSIS — D122 Benign neoplasm of ascending colon: Secondary | ICD-10-CM | POA: Diagnosis not present

## 2017-07-29 DIAGNOSIS — K5730 Diverticulosis of large intestine without perforation or abscess without bleeding: Secondary | ICD-10-CM | POA: Insufficient documentation

## 2017-07-29 DIAGNOSIS — Z87891 Personal history of nicotine dependence: Secondary | ICD-10-CM | POA: Diagnosis not present

## 2017-07-29 DIAGNOSIS — D123 Benign neoplasm of transverse colon: Secondary | ICD-10-CM | POA: Insufficient documentation

## 2017-07-29 DIAGNOSIS — Z7982 Long term (current) use of aspirin: Secondary | ICD-10-CM | POA: Diagnosis not present

## 2017-07-29 DIAGNOSIS — K633 Ulcer of intestine: Secondary | ICD-10-CM | POA: Diagnosis not present

## 2017-07-29 DIAGNOSIS — K219 Gastro-esophageal reflux disease without esophagitis: Secondary | ICD-10-CM | POA: Insufficient documentation

## 2017-07-29 DIAGNOSIS — Z8719 Personal history of other diseases of the digestive system: Secondary | ICD-10-CM | POA: Insufficient documentation

## 2017-07-29 DIAGNOSIS — I10 Essential (primary) hypertension: Secondary | ICD-10-CM | POA: Diagnosis not present

## 2017-07-29 DIAGNOSIS — D128 Benign neoplasm of rectum: Secondary | ICD-10-CM | POA: Diagnosis not present

## 2017-07-29 DIAGNOSIS — N400 Enlarged prostate without lower urinary tract symptoms: Secondary | ICD-10-CM | POA: Diagnosis not present

## 2017-07-29 DIAGNOSIS — Z87442 Personal history of urinary calculi: Secondary | ICD-10-CM | POA: Insufficient documentation

## 2017-07-29 DIAGNOSIS — K621 Rectal polyp: Secondary | ICD-10-CM | POA: Diagnosis not present

## 2017-07-29 HISTORY — PX: COLONOSCOPY WITH PROPOFOL: SHX5780

## 2017-07-29 HISTORY — PX: POLYPECTOMY: SHX5525

## 2017-07-29 HISTORY — PX: BIOPSY: SHX5522

## 2017-07-29 SURGERY — COLONOSCOPY WITH PROPOFOL
Anesthesia: Monitor Anesthesia Care

## 2017-07-29 MED ORDER — PROPOFOL 500 MG/50ML IV EMUL
INTRAVENOUS | Status: DC | PRN
  Administered 2017-07-29: 100 ug/kg/min via INTRAVENOUS

## 2017-07-29 MED ORDER — MIDAZOLAM HCL 2 MG/2ML IJ SOLN
INTRAMUSCULAR | Status: AC
  Filled 2017-07-29: qty 2

## 2017-07-29 MED ORDER — PROPOFOL 10 MG/ML IV BOLUS
INTRAVENOUS | Status: AC
  Filled 2017-07-29: qty 20

## 2017-07-29 MED ORDER — LACTATED RINGERS IV SOLN
INTRAVENOUS | Status: DC
  Administered 2017-07-29: 11:00:00 via INTRAVENOUS

## 2017-07-29 MED ORDER — PROPOFOL 10 MG/ML IV BOLUS
INTRAVENOUS | Status: DC | PRN
  Administered 2017-07-29: 10 mg via INTRAVENOUS

## 2017-07-29 NOTE — Anesthesia Postprocedure Evaluation (Signed)
Anesthesia Post Note  Patient: [name]  Procedure(s) Performed: COLONOSCOPY WITH PROPOFOL ([name] ) POLYPECTOMY BIOPSY  Patient location during evaluation: PACU Anesthesia Type: MAC Level of consciousness: awake and alert and oriented Pain management: pain level controlled Vital Signs Assessment: post-procedure vital signs reviewed and stable Respiratory status: spontaneous breathing Cardiovascular status: blood pressure returned to baseline Postop Assessment: no apparent nausea or vomiting Anesthetic complications: no     Last Vitals:  Vitals:   07/29/17 1041  BP: (!) 180/92  Pulse: (!) 54  Resp: 16  Temp: 36.4 C  SpO2: 94%    Last Pain:  Vitals:   07/29/17 1041  TempSrc: Oral  PainSc: 0-No pain                 Leilana Mcquire

## 2017-07-29 NOTE — Op Note (Signed)
[facility] Patient Name: [name] Procedure Date: 07/29/2017 11:17 AM MRN: [id number] Date of Birth: 01/17/1941 Attending MD: Norvel Richards , MD CSN: [id number] Age: 77 Admit Type: Outpatient Procedure:                Colonoscopy Indications:              Hematochezia Providers:                Norvel Richards, MD, Janeece Riggers, RN, Lurline Del, RN Referring MD:              Medicines:                Propofol per Anesthesia Complications:            No immediate complications. Estimated Blood Loss:     Estimated blood loss was minimal. Procedure:                Pre-Anesthesia Assessment:                           - Prior to the procedure, a History and Physical                            was performed, and patient medications and                            allergies were reviewed. The patient's tolerance of                            previous anesthesia was also reviewed. The risks                            and benefits of the procedure and the sedation                            options and risks were discussed with the patient.                            All questions were answered, and informed consent                            was obtained. Prior Anticoagulants: The patient has                            taken no previous anticoagulant or antiplatelet                            agents. ASA Grade Assessment: III - A patient with                            severe systemic disease. After reviewing the risks  and benefits, the patient was deemed in                            satisfactory condition to undergo the procedure.                           After obtaining informed consent, the colonoscope                            was passed under direct vision. Throughout the                            procedure, the patient's blood pressure, pulse, and                            oxygen saturations were monitored  continuously. The                            EC-[id number] ([id number]) scope was introduced through                            the and advanced to the the cecum, identified by                            appendiceal orifice and ileocecal valve. The                            colonoscopy was performed without difficulty. The                            ileocecal valve, appendiceal orifice, and rectum                            were photographed. The entire colon was examined. Scope In: 11:42:58 AM Scope Out: 12:18:29 PM Scope Withdrawal Time: 0 hours 27 minutes 7 seconds  Total Procedure Duration: 0 hours 35 minutes 31 seconds  Findings:      The perianal and digital rectal examinations were normal.      Scattered small-mouthed diverticula were found in the entire colon.      Three semi-pedunculated polyps were found in the splenic flexure and       cecum. The polyps were 4 to 6 mm in size. These polyps were removed with       a cold snare. Resection and retrieval were complete. Estimated blood       loss was minimal.      A 10 mm polyp was found in the rectum. The polyp was semi-pedunculated.       The polyp was removed with a hot snare. Resection and retrieval were       complete. Estimated blood loss was minimal.      diffusely abnormal aring rectal and sigmoid mucosa with granularity,       scattered ulcertions or erosions and loss of the normal vasculaern..       These changes rapidly tapered off following this the descending segmen       all the way he cecum. Bi of  the normal-appearing ascensegment as well as       amal sigmoid segment tudy . the hot snare polypectomy site in the rectum       tended to use after polypectomy; a single hemostasis clip was placed to       achieve excellent hemostasis Impression:               - Diverticulosis in the entire examined colon.                           - Three 4 to 6 mm polyps at the splenic flexure and                            in the cecum,  removed with a cold snare. Resected                            and retrieved.                           - One 10 mm polyp in the rectum, removed with a hot                            snare. Resected and retrieved.                           - Abnormal rectum and sigmoid segment consistent                            with active ulcer colitis?"biopsied hemostasis clip                            placed. Moderate Sedation:      Moderate (conscious) sedation was personally administered by an       anesthesia professional. The following parameters were monitored: oxygen       saturation, heart rate, blood pressure, respiratory rate, EKG, adequacy       of pulmonary ventilation, and response to care. Total physician       intraservice time was 42 minutes. Recommendation:           - Patient has a contact number available for                            emergencies. The signs and symptoms of potential                            delayed complications were discussed with the                            patient. Return to normal activities tomorrow.                            Written discharge instructions were provided to the                            patient.                           -  Advance diet as tolerated.                           - Continue present medications. Continue budesonide                            9 mg daily as this seems to be helping his colitis                            symptoms significantly; would like to see him                            transition back to a mesalamine preparation over                            the subsequent months.                           - Repeat colonoscopy date to be determined after                            pending pathology results are reviewed for                            surveillance based on pathology results.                           - Return to GI office in 3 months. No MRI until                            clip igone. Procedure  Code(s):        --- Professional ---                           [phone], Colonoscopy, flexible; with removal of                            tumor(s), polyp(s), or other lesion(s) by snare                            technique Diagnosis Code(s):        --- Professional ---                           D12.3, Benign neoplasm of transverse colon (hepatic                            flexure or splenic flexure)                           D12.0, Benign neoplasm of cecum                           K62.1, Rectal polyp  K92.1, Melena (includes Hematochezia)                           K57.30, Diverticulosis of large intestine without                            perforation or abscess without bleeding CPT copyright 2017 American Medical Association. All rights reserved. The codes documented in this report are preliminary and upon coder review may  be revised to meet current compliance requirements. Cristopher Estimable. Deneane Stifter, MD Norvel Richards, MD 07/29/2017 12:30:48 PM This report has been signed electronically. Number of Addenda: 0

## 2017-07-29 NOTE — Transfer of Care (Signed)
Immediate Anesthesia Transfer of Care Note  Patient: [name]  Procedure(s) Performed: COLONOSCOPY WITH PROPOFOL ([name] ) POLYPECTOMY BIOPSY  Patient Location: PACU  Anesthesia Type:MAC  Level of Consciousness: awake, alert  and oriented  Airway & Oxygen Therapy: Patient Spontanous Breathing  Post-op Assessment: Report given to RN  Post vital signs: Reviewed and stable  Last Vitals:  Vitals Value Taken Time  BP 147/65 07/29/2017 12:28 PM  Temp    Pulse 52 07/29/2017 12:30 PM  Resp 15 07/29/2017 12:30 PM  SpO2 94 % 07/29/2017 12:30 PM  Vitals shown include unvalidated device data.  Last Pain:  Vitals:   07/29/17 1041  TempSrc: Oral  PainSc: 0-No pain      Patients Stated Pain Goal: 6 (62/83/15 1761)  Complications: No apparent anesthesia complications

## 2017-07-29 NOTE — Progress Notes (Signed)
Pt with blood pressure 194/70.  Spoke with Dr Rick Duff, Anesthesia regarding patient's elevated blood pressure.  Verbal order to discharge patient with  instructions to take all blood pressure medications when he gets home. Advised patient's family and they verbalized understanding.

## 2017-07-29 NOTE — Telephone Encounter (Signed)
Patient is needing handicap placard form signed.  See in yellow basket.  Would like to pick up today.

## 2017-07-29 NOTE — Discharge Instructions (Signed)
Colonoscopy Discharge Instructions  Read the instructions outlined below and refer to this sheet in the next few weeks. These discharge instructions provide you with general information on caring for yourself after you leave the hospital. Your doctor may also give you specific instructions. While your treatment has been planned according to the most current medical practices available, unavoidable complications occasionally occur. If you have any problems or questions after discharge, call Dr. Gala Romney at [phone]. ACTIVITY  You may resume your regular activity, but move at a slower pace for the next 24 hours.   Take frequent rest periods for the next 24 hours.   Walking will help get rid of the air and reduce the bloated feeling in your belly (abdomen).   No driving for 24 hours (because of the medicine (anesthesia) used during the test).    Do not sign any important legal documents or operate any machinery for 24 hours (because of the anesthesia used during the test).  NUTRITION  Drink plenty of fluids.   You may resume your normal diet as instructed by your doctor.   Begin with a light meal and progress to your normal diet. Heavy or fried foods are harder to digest and may make you feel sick to your stomach (nauseated).   Avoid alcoholic beverages for 24 hours or as instructed.  MEDICATIONS  You may resume your normal medications unless your doctor tells you otherwise.  WHAT YOU CAN EXPECT TODAY  Some feelings of bloating in the abdomen.   Passage of more gas than usual.   Spotting of blood in your stool or on the toilet paper.  IF YOU HAD POLYPS REMOVED DURING THE COLONOSCOPY:  No aspirin products for 7 days or as instructed.   No alcohol for 7 days or as instructed.   Eat a soft diet for the next 24 hours.  FINDING OUT THE RESULTS OF YOUR TEST Not all test results are available during your visit. If your test results are not back during the visit, make an appointment  with your caregiver to find out the results. Do not assume everything is normal if you have not heard from your caregiver or the medical facility. It is important for you to follow up on all of your test results.  SEEK IMMEDIATE MEDICAL ATTENTION IF:  You have more than a spotting of blood in your stool.   Your belly is swollen (abdominal distention).   You are nauseated or vomiting.   You have a temperature over 101.   You have abdominal pain or discomfort that is severe or gets worse throughout the day.   Colon Polyps Polyps are tissue growths inside the body. Polyps can grow in many places, including the large intestine (colon). A polyp may be a round bump or a mushroom-shaped growth. You could have one polyp or several. Most colon polyps are noncancerous (benign). However, some colon polyps can become cancerous over time. What are the causes? The exact cause of colon polyps is not known. What increases the risk? This condition is more likely to develop in people who:  Have a family history of colon cancer or colon polyps.  Are older than 29 or older than 45 if they are African American.  Have inflammatory bowel disease, such as ulcerative colitis or Crohn disease.  Are overweight.  Smoke cigarettes.  Do not get enough exercise.  Drink too much alcohol.  Eat a diet that is: ? High in fat and red meat. ? Low in fiber.  Had childhood cancer that was treated with abdominal radiation.  What are the signs or symptoms? Most polyps do not cause symptoms. If you have symptoms, they may include:  Blood coming from your rectum when having a bowel movement.  Blood in your stool.The stool may look dark red or black.  A change in bowel habits, such as constipation or diarrhea.  How is this diagnosed? This condition is diagnosed with a colonoscopy. This is a procedure that uses a lighted, flexible scope to look at the inside of your colon. How is this treated? Treatment for  this condition involves removing any polyps that are found. Those polyps will then be tested for cancer. If cancer is found, your health care provider will talk to you about options for colon cancer treatment. Follow these instructions at home: Diet  Eat plenty of fiber, such as fruits, vegetables, and whole grains.  Eat foods that are high in calcium and vitamin D, such as milk, cheese, yogurt, eggs, liver, fish, and broccoli.  Limit foods high in fat, red meats, and processed meats, such as hot dogs, sausage, bacon, and lunch meats.  Maintain a healthy weight, or lose weight if recommended by your health care provider. General instructions  Do not smoke cigarettes.  Do not drink alcohol excessively.  Keep all follow-up visits as told by your health care provider. This is important. This includes keeping regularly scheduled colonoscopies. Talk to your health care provider about when you need a colonoscopy.  Exercise every day or as told by your health care provider. Contact a health care provider if:  You have new or worsening bleeding during a bowel movement.  You have new or increased blood in your stool.  You have a change in bowel habits.  You unexpectedly lose weight. This information is not intended to replace advice given to you by your health care provider. Make sure you discuss any questions you have with your health care provider. Document Released: 10/19/2003 Document Revised: 06/30/2015 Document Reviewed: 12/13/2014 Elsevier Interactive Patient Education  [employer].   Diverticulosis Diverticulosis is a condition that develops when small pouches (diverticula) form in the wall of the large intestine (colon). The colon is where water is absorbed and stool is formed. The pouches form when the inside layer of the colon pushes through weak spots in the outer layers of the colon. You may have a few pouches or many of them. What are the causes? The cause of this  condition is not known. What increases the risk? The following factors may make you more likely to develop this condition:  Being older than age 49. Your risk for this condition increases with age. Diverticulosis is rare among people younger than age 32. By age 59, many people have it.  Eating a low-fiber diet.  Having frequent constipation.  Being overweight.  Not getting enough exercise.  Smoking.  Taking over-the-counter pain medicines, like aspirin and ibuprofen.  Having a family history of diverticulosis.  What are the signs or symptoms? In most people, there are no symptoms of this condition. If you do have symptoms, they may include:  Bloating.  Cramps in the abdomen.  Constipation or diarrhea.  Pain in the lower left side of the abdomen.  How is this diagnosed? This condition is most often diagnosed during an exam for other colon problems. Because diverticulosis usually has no symptoms, it often cannot be diagnosed independently. This condition may be diagnosed by:  Using a flexible scope to examine  the colon (colonoscopy).  Taking an X-ray of the colon after dye has been put into the colon (barium enema).  Doing a CT scan.  How is this treated? You may not need treatment for this condition if you have never developed an infection related to diverticulosis. If you have had an infection before, treatment may include:  Eating a high-fiber diet. This may include eating more fruits, vegetables, and grains.  Taking a fiber supplement.  Taking a live bacteria supplement (probiotic).  Taking medicine to relax your colon.  Taking antibiotic medicines.  Follow these instructions at home:  Drink 6-8 glasses of water or more each day to prevent constipation.  Try not to strain when you have a bowel movement.  If you have had an infection before: ? Eat more fiber as directed by your health care provider or your diet and nutrition specialist (dietitian). ? Take  a fiber supplement or probiotic, if your health care provider approves.  Take over-the-counter and prescription medicines only as told by your health care provider.  If you were prescribed an antibiotic, take it as told by your health care provider. Do not stop taking the antibiotic even if you start to feel better.  Keep all follow-up visits as told by your health care provider. This is important. Contact a health care provider if:  You have pain in your abdomen.  You have bloating.  You have cramps.  You have not had a bowel movement in 3 days. Get help right away if:  Your pain gets worse.  Your bloating becomes very bad.  You have a fever or chills, and your symptoms suddenly get worse.  You vomit.  You have bowel movements that are bloody or black.  You have bleeding from your rectum. Summary  Diverticulosis is a condition that develops when small pouches (diverticula) form in the wall of the large intestine (colon).  You may have a few pouches or many of them.  This condition is most often diagnosed during an exam for other colon problems.  If you have had an infection related to diverticulosis, treatment may include increasing the fiber in your diet, taking supplements, or taking medicines. This information is not intended to replace advice given to you by your health care provider. Make sure you discuss any questions you have with your health care provider. Document Released: 10/20/2003 Document Revised: 12/12/2015 Document Reviewed: 12/12/2015 Elsevier Interactive Patient Education  2017 [employer].   Colon polyp and diverticulosis information provided  No change in medical regimen at this time  Further recommendations to follow pending review of pathology report  Office visit with [place] in 2 months  No future MRI until clip gone.

## 2017-07-29 NOTE — H&P (Signed)
@LOGO @   Primary Care Physician:  [name], MD Primary Gastroenterologist:  Dr. Gala Romney Pre-Procedure History & Physical: HPI:  [name] is a 77 y.o. male here for evaluation of rectal bleeding is a left-sided colitis. Since being on budesonide 9 mg daily, bowel functions normalize. Rectal bleeding has resolved.  Past Medical History:  Diagnosis Date  . Asthma   . BPH (benign prostatic hyperplasia)   . CAD (coronary artery disease) 01/1995  . Clostridium difficile colitis 04/2005  . Colitis 2011  . COPD (chronic obstructive pulmonary disease) ([place])   . Depression   . Diverticulosis   . DJD (degenerative joint disease)   . Gastric ulcer 04/17/10   Three 79m gastric ulcers, H.pylori serologies were negative  . GERD (gastroesophageal reflux disease)   . History of kidney stones   . Hyperlipidemia   . Hypertension   . Idiopathic chronic inflammatory bowel disease 05/18/2010   left-sided UC  . Kidney stone   . Obstructive sleep apnea   . Reflux 02/1995  . S/P endoscopy 07/24/10   retained gastric contents, benign bx    Past Surgical History:  Procedure Laterality Date  . ANORECTAL MANOMETRY  2016   [facility]: concern for possible fissure. Noted pelvic floor dyssnyergy  . CARDIAC CATHETERIZATION     with stent  . CARDIOVASCULAR STRESS TEST  07/21/2009   No scintigraphic evidence of inducible myocardial ischemia  . CARPAL TUNNEL RELEASE Left 01/17/2017   Procedure: LEFT CARPAL TUNNEL RELEASE;  Surgeon: HCarole Civil MD;  Location: [facility];  Service: Orthopedics;  Laterality: Left;  . CATARACT EXTRACTION, BILATERAL Bilateral   . CERVICAL SPINE SURGERY     C4-5  . COLONOSCOPY  04/2005   granularity and friability erosions from rectum to 40cm. Bx infection vs IBD. C. Diff positive at the time.   . COLONOSCOPY  05/2010   Adrian Specht: left-sided UC, bx with no dysplasia, shallow diverticula  . COLONOSCOPY [name] 11/07/2012   RNTZ:[id number]-CBSWHproctocolitis status post segmental  biopsy/Sigmoid colon polyps removed as described above. Procedure compromisd by technical difficulties. bx: Inflammation limited to sigmoid and rectum on pathology.  . COLONOSCOPY  04/2014   [facility] proctocolitis limited to sigmoid  . CORONARY STENT PLACEMENT  01/1995  . ESOPHAGOGASTRODUODENOSCOPY  07/24/2010   RQPR:FFMBWGesophagus  . ESOPHAGOGASTRODUODENOSCOPY  04/2014   [facility]  . FOOT SURGERY Bilateral two  . KNEE SURGERY  two  . SHOULDER SURGERY  two  . TONSILLECTOMY    . TRANSTHORACIC ECHOCARDIOGRAM  03/23/2009   EF 60-65%, normal LV systolic function  . ULNAR HEAD EXCISION Left 01/17/2017   Procedure: LEFT ULNAR HEAD RESECTION;  Surgeon: HCarole Civil MD;  Location: [facility];  Service: Orthopedics;  Laterality: Left;    Prior to Admission medications   Medication Sig Start Date End Date Taking? Authorizing Provider  acetaminophen (TYLENOL) 325 MG tablet Take 650 mg by mouth every 6 (six) hours as needed for moderate pain or headache.   Yes [provider]  amLODipine (NORVASC) 5 MG tablet TAKE 1 TABLET BY MOUTH ONCE DAILY 07/24/17  Yes LKathyrn Drown MD  aspirin 81 MG tablet Take 81 mg by mouth at bedtime.    Yes [provider]  doxazosin (CARDURA) 8 MG tablet Take 4 mg by mouth at bedtime.    Yes [provider]  furosemide (LASIX) 40 MG tablet Take 40 mg by mouth at bedtime.  Yes [provider]  losartan (COZAAR) 25 MG tablet TAKE 1 TABLET BY MOUTH ONCE DAILY 07/24/17  Yes Luking, Elayne Snare, MD  metoprolol tartrate (LOPRESSOR) 50 MG tablet Take 3 tablets (150 mg total) by mouth at bedtime. TAKE ONE TABLET BY MOUTH ONCE DAILY IN THE MORNING AND  2  TABLETS  AT  BEDTIME Patient taking differently: Take 150 mg by mouth at bedtime.  07/11/17  Yes [name], MD  nitroGLYCERIN (NITROSTAT) 0.4 MG SL tablet Place 0.4 mg under the tongue every 5 (five) minutes as needed  for chest pain.   Yes [provider]  OVER THE COUNTER MEDICATION Omega XL 4 tablets at night   Yes [provider]  OVER THE COUNTER MEDICATION Take 1 tablet by mouth at bedtime. Strong Heart Otc supplement   Yes [provider]  pravastatin (PRAVACHOL) 40 MG tablet Take 1 tablet (40 mg total) by mouth daily. Patient taking differently: Take 40 mg by mouth at bedtime.  07/11/17  Yes [name], MD  raNITIdine HCl (ACID REDUCER PO) Take 2 tablets by mouth at bedtime.   Yes [provider]    Allergies as of 07/03/2017  . (No Known Allergies)    Family History  Problem Relation Age of Onset  . Lung cancer Mother   . Cancer Mother        breast  . Diabetes Mother   . Stroke Father   . Hypertension Father   . Colon cancer Neg Hx     Social History   Socioeconomic History  . Marital status: Widowed    Spouse name: Not on file  . Number of children: Not on file  . Years of education: Not on file  . Highest education level: Not on file  Occupational History  . 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 Financial resource strain: Not on file  . Food insecurity:    Worry: Not on file    Inability: Not on file  . Transportation needs:    Medical: Not on file    Non-medical: Not on file  Tobacco Use  . Smoking status: Former Smoker    Packs/day: 1.00    Years: 20.00    Pack years: 20.00    Types: Cigarettes    Last attempt to quit: 09/30/1969    Years since quitting: 47.8  . Smokeless tobacco: Never Used  Substance and Sexual Activity  . Alcohol use: No  . Drug use: No  . Sexual activity: Yes    Partners: Female    Birth control/protection: None    Comment: spouse  Lifestyle  . Physical activity:    Days per week: Not on file    Minutes per session: Not on file  . Stress: Not on file  Relationships  . Social connections:    Talks on phone: Not on file    Gets together: Not on file    Attends religious  service: Not on file    Active member of club or organization: Not on file    Attends meetings of clubs or organizations: Not on file    Relationship status: Not on file  . Intimate partner violence:    Fear of current or ex partner: Not on file    Emotionally abused: Not on file    Physically abused: Not on file    Forced sexual activity: Not on file  Other Topics Concern  . Not on file  Social History Narrative  . Not on file    Review of Systems: See HPI, otherwise negative ROS  Physical Exam: BP (!) 180/92   Pulse (!) 54   Temp 97.6 F (36.4 C) (Oral)   Resp 16   Ht 6' 1"  (1.854 m)   Wt 285 lb (129.3 kg)   SpO2 94%   BMI 37.60 kg/m  General:   Alert,  Well-developed, well-nourished, pleasant and cooperative in NAD Neck:  Supple; no masses or thyromegaly. No significant cervical adenopathy. Lungs:  Clear throughout to auscultation.   No wheezes, crackles, or rhonchi. No acute distress. Heart:  Regular rate and rhythm; no murmurs, clicks, rubs,  or gallops. Abdomen: Non-distended, normal bowel sounds.  Soft and nontender without appreciable mass or hepatosplenomegaly.  Pulses:  Normal pulses noted. Extremities:  Without clubbing or edema.  Impression/plan:  77 year old gentleman rectal bleeding in the setting of left-sided colitis. Colonoscopy now moving on to further evaluate.  The risks, benefits, limitations, alternatives and imponderables have been reviewed with the patient. Questions have been answered. All parties are agreeable.    Notice: This dictation was prepared with Dragon dictation along with smaller phrase technology. Any transcriptional errors that result from this process are unintentional and may not be corrected upon review.

## 2017-07-29 NOTE — Anesthesia Preprocedure Evaluation (Addendum)
Anesthesia Evaluation  Patient identified by MRN, date of birth, ID band Patient awake    Reviewed: Allergy & Precautions, H&P , NPO status , Patient's Chart, lab work & pertinent test results, reviewed documented beta blocker date and time   Airway Mallampati: II  TM Distance: >3 FB Neck ROM: Limited   Comment: No extension secondary to fusion Dental no notable dental hx. (+) Edentulous Upper, Edentulous Lower   Pulmonary neg pulmonary ROS, asthma , sleep apnea , COPD, former smoker,    Pulmonary exam normal breath sounds clear to auscultation       Cardiovascular Exercise Tolerance: Good hypertension, + CAD  negative cardio ROS   Rhythm:regular Rate:Normal     Neuro/Psych PSYCHIATRIC DISORDERS Depression  Neuromuscular disease negative neurological ROS  negative psych ROS   GI/Hepatic negative GI ROS, Neg liver ROS, hiatal hernia, PUD, GERD  ,  Endo/Other  negative endocrine ROS  Renal/GU Renal diseasenegative Renal ROS  negative genitourinary   Musculoskeletal   Abdominal   Peds  Hematology negative hematology ROS (+)   Anesthesia Other Findings   Reproductive/Obstetrics negative OB ROS                            Anesthesia Physical Anesthesia Plan  ASA: III  Anesthesia Plan: MAC   Post-op Pain Management:    Induction:   PONV Risk Score and Plan:   Airway Management Planned:   Additional Equipment:   Intra-op Plan:   Post-operative Plan:   Informed Consent: I have reviewed the patients History and Physical, chart, labs and discussed the procedure including the risks, benefits and alternatives for the proposed anesthesia with the patient or authorized representative who has indicated his/her understanding and acceptance.   Dental Advisory Given  Plan Discussed with: CRNA  Anesthesia Plan Comments:         Anesthesia Quick Evaluation

## 2017-07-29 NOTE — Telephone Encounter (Signed)
This form was filled out please forward to the patient

## 2017-07-30 ENCOUNTER — Other Ambulatory Visit: Payer: Self-pay | Admitting: Gastroenterology

## 2017-07-30 NOTE — Telephone Encounter (Signed)
Noted. Spoke with pharmacist at [facility] and medication refill with instructions of taper was given over the phone.

## 2017-07-30 NOTE — Telephone Encounter (Signed)
We are receiving request for refill on budesonide with a air stating that medication not found and cannot be refilled electronically.  Please call [facility] and request a refill "[employer] formulated generic compounded for Uceris 68m" daily for 30 days, then taper to 639mdaily for 2 weeks, 68m41maily for 2 weeks then stop. Quantity sufficient.

## 2017-07-31 ENCOUNTER — Encounter (HOSPITAL_COMMUNITY): Payer: Self-pay | Admitting: Internal Medicine

## 2017-08-02 ENCOUNTER — Encounter: Payer: Self-pay | Admitting: Internal Medicine

## 2017-08-16 ENCOUNTER — Other Ambulatory Visit (HOSPITAL_COMMUNITY): Payer: Medicare HMO

## 2017-08-19 ENCOUNTER — Encounter: Payer: Medicare HMO | Admitting: Nutrition

## 2017-08-19 ENCOUNTER — Telehealth: Payer: Self-pay | Admitting: Family Medicine

## 2017-08-19 DIAGNOSIS — R7303 Prediabetes: Secondary | ICD-10-CM

## 2017-08-19 NOTE — Telephone Encounter (Signed)
Please give a new referral for diabetic education, diagnosis diabetes, obesity

## 2017-08-19 NOTE — Telephone Encounter (Signed)
[name] from [facility] is calling to request a new referral for [name].

## 2017-08-19 NOTE — Telephone Encounter (Signed)
Referral ordered in Epic.

## 2017-08-22 DIAGNOSIS — G4733 Obstructive sleep apnea (adult) (pediatric): Secondary | ICD-10-CM | POA: Diagnosis not present

## 2017-10-01 ENCOUNTER — Ambulatory Visit: Payer: Medicare HMO | Admitting: Internal Medicine

## 2017-10-02 ENCOUNTER — Encounter: Payer: Self-pay | Admitting: Nutrition

## 2017-10-02 ENCOUNTER — Encounter: Payer: Medicare HMO | Attending: Family Medicine | Admitting: Nutrition

## 2017-10-02 ENCOUNTER — Telehealth: Payer: Self-pay | Admitting: General Practice

## 2017-10-02 DIAGNOSIS — E782 Mixed hyperlipidemia: Secondary | ICD-10-CM

## 2017-10-02 NOTE — Progress Notes (Signed)
Medical Nutrition Therapy:  Appt start time: 8841  end time:  1100 Assessment:  Primary concerns today: Overweight, Prediabetes and elevated TG's. He is here with his male friend.. He notes he isn't very well. Lost his daughter a few weeks ago at age 77. Has cut out sweets.  Drinks lots of water. Still having issues with bowels. Has been under a lot of stress. Lost his daughter and 4-5 relatives recently. Sleeping a lot. His male friends is concerned with his sleeping so much. He doesn't want to go out of the house due to loose bowels.  Appears depressed. Lacks interest in doing things he normally enjoys. Sees PCP soon. Says he is losing weight but scales don't show it.  He has a slight  wheezing sound while breathing.   He has sleep apnea machine and says he wears it. His 2 daughters call hiim most every day. He needs help with meal preparation and shopping. Has been eating high salt foods; spam, chicken noodle soup. Sometimes doesn't eat when he doesn't feel like it.  Not able to exercise from chronic fatigue. Encouraged him to talk to PCP about possible depression and sleeping issues.  Lab Results  Component Value Date   HGBA1C 5.8 11/18/2014   CMP Latest Ref Rng & Units 07/24/2017 06/18/2017 01/15/2017  Glucose 65 - 99 mg/dL 94 109(H) 93  BUN 6 - 20 mg/dL 16 14 14   Creatinine 0.61 - 1.24 mg/dL 0.88 0.91 0.86  Sodium 135 - 145 mmol/L 144 148(H) 140  Potassium 3.5 - 5.1 mmol/L 3.6 4.3 3.9  Chloride 101 - 111 mmol/L 109 104 106  CO2 22 - 32 mmol/L 28 21 25   Calcium 8.9 - 10.3 mg/dL 9.5 9.8 9.3  Total Protein 6.0 - 8.5 g/dL - - -  Total Bilirubin 0.0 - 1.2 mg/dL - - -  Alkaline Phos 39 - 117 IU/L - - -  AST 0 - 40 IU/L - - -  ALT 0 - 44 IU/L - - -    Lipid Panel     Component Value Date/Time   CHOL 171 08/07/2016 0847   TRIG 230 (H) 08/07/2016 0847   HDL 49 08/07/2016 0847   CHOLHDL 3.5 08/07/2016 0847   CHOLHDL 3.2 06/08/2015 0907   VLDL 33 (H) 06/08/2015 0907   LDLCALC  76 08/07/2016 0847   Preferred Learning Style:    No preference indicated   Learning Readiness:   Ready  Change in progress   MEDICATIONS: ADL   DIETARY INTAKE: B) Egg, cheese and sausage croissant and water L) Boiled chicken,, onions, 7 layer of salad, and egg custard D)  Can't remember. Usual physical activity: ADL  Estimated energy needs: 1800  calories 200 g carbohydrates 135 g protein 50 g fat  Progress Towards Goal(s):  In progress.   Nutritional Diagnosis:  NI-1.5 Excessive energy intake As related to HIgh Fat high Salt HIgh Calorie Diet.  As evidenced by Obesity, BMI > 40 and Prediabetes.    Intervention:  Nutrition. Predidabetes and Weight Loss education provided on My Plate, CHO counting, meal planning, portion sizes, timing of meals, avoiding snacks between meals , target ranges for A1C and blood sugars, taking medications as prescribed, benefits of exercising 30 minutes per day and prevention of  DM. High Fiber Low Fat Low Sodium Diet    Goals 1. Try to eat yogurt with lunch 2.; Increase water intake to 5 bottles per day 3. Follow Low Residue Diet 4. Cut out fried foods and  canned meats Eat more fresh fruit and vegetables. Eat breakfast 6-8/ L) 12-2 pm and D) 5-7 pm  Teaching Method Utilized:  Visual Auditory Hands on  Handouts given during visit include:  The Plate Method  Meal Plan Card Pre-Diabetes Instructions Weight loss tips Barriers to learning/adherence to lifestyle change: none  Demonstrated degree of understanding via:  Teach Back   Monitoring/Evaluation:  Dietary intake, exercise, meal planning, and body weight in 6 months. Consider rechecking A1C and referral for re-evaluating his CPAP machine. Needs evaluation for depression.

## 2017-10-02 NOTE — Telephone Encounter (Signed)
I received a call from [facility] the nutrionoist for [facility] and she stated the patient was in her office asking for results from his tcs he had done 07/2017.  I made him aware we mailed him a letter with the results three days after his tcs.  He stated he never received that letter.  I verified that we had the correct address and mailed him another letter and gave him resilts via telephone.

## 2017-10-02 NOTE — Patient Instructions (Addendum)
Goals 1. Try to eat yogurt with lunch 2.; Increase water intake to 5 bottles per day 3. Follow Low Residue Diet 4. Cut out fried foods and canned meats Eat more fresh fruit and vegetables.

## 2017-10-08 NOTE — Telephone Encounter (Signed)
Noted. Pt is done with medication.

## 2017-10-08 NOTE — Telephone Encounter (Signed)
He should have tapered down as prescribed. If he has done 3 mg for 2 weeks, he can stop.

## 2017-10-08 NOTE — Telephone Encounter (Signed)
AB I received a letter from [facility] Patient Assistance program stating that they received pts request for his medication (Uceris) and a prescriptions is needed to complete request. Pt assistance was filed back in May of 2019.   Called pt and pt states that he is still taking Uceris medication once daily. [place] and they last filed pts medication early August 2019 and pt should've finished his last pill a few days ago.   Please let me know if we need to pursue pt assistance at this time or is pt done with this medication. Pt is scheduled for an appointment 10/23/17.

## 2017-10-22 ENCOUNTER — Ambulatory Visit: Payer: Medicare HMO | Admitting: Family Medicine

## 2017-10-23 ENCOUNTER — Encounter: Payer: Self-pay | Admitting: Gastroenterology

## 2017-10-23 ENCOUNTER — Ambulatory Visit (INDEPENDENT_AMBULATORY_CARE_PROVIDER_SITE_OTHER): Payer: Medicare HMO | Admitting: Gastroenterology

## 2017-10-23 VITALS — BP 162/72 | HR 70 | Temp 97.0°F | Ht 73.0 in | Wt 290.6 lb

## 2017-10-23 DIAGNOSIS — K5150 Left sided colitis without complications: Secondary | ICD-10-CM

## 2017-10-23 NOTE — Progress Notes (Signed)
Referring Provider: [name], MD Primary Care Physician:  [name], MD  Primary GI: Dr. Gala Romney   Chief Complaint  Patient presents with  . Gastroesophageal Reflux  . Rectal Bleeding    no longer has bleeding  . Diarrhea    still having accidents    HPI:   [name] is a 77 y.o. male presenting today with a history of left-sided proctocolitis, last seen in May 2019 with flare. Difficult to manage historically due to lack of prescription costs. We compounded entocort with [facility] and arranged updated colonoscopy which showed active left-sided proctocolitis.  No rectal bleeding. Has fecal urgency. Intermittent loose stool. Taking diarrhea pills. Eats cheese on sandwiches, no milk, occasional ice cream. Finished last dose of 3 mg entocort yesterday. After review, Colazal will be preferred. Does not have anyone to help him place suppositories or retention enemas.   [facility] will not be a preferred pharmacy.    Past Medical History:  Diagnosis Date  . Asthma   . BPH (benign prostatic hyperplasia)   . CAD (coronary artery disease) 01/1995  . Clostridium difficile colitis 04/2005  . Colitis 2011  . COPD (chronic obstructive pulmonary disease) ([place])   . Depression   . Diverticulosis   . DJD (degenerative joint disease)   . Gastric ulcer 04/17/10   Three 78m gastric ulcers, H.pylori serologies were negative  . GERD (gastroesophageal reflux disease)   . History of kidney stones   . Hyperlipidemia   . Hypertension   . Idiopathic chronic inflammatory bowel disease 05/18/2010   left-sided UC  . Kidney stone   . Obstructive sleep apnea   . Reflux 02/1995  . S/P endoscopy 07/24/10   retained gastric contents, benign bx    Past Surgical History:  Procedure Laterality Date  . ANORECTAL MANOMETRY  2016   [facility]: concern for possible fissure. Noted pelvic floor dyssnyergy  . BIOPSY  07/29/2017   Procedure: BIOPSY;  Surgeon: RDaneil Dolin MD;  [facility];  Service: Endoscopy;;  ascending and sigmoid colon  . CARDIAC CATHETERIZATION     with stent  . CARDIOVASCULAR STRESS TEST  07/21/2009   No scintigraphic evidence of inducible myocardial ischemia  . CARPAL TUNNEL RELEASE Left 01/17/2017   Procedure: LEFT CARPAL TUNNEL RELEASE;  Surgeon: HCarole Civil MD;  Location: [facility];  Service: Orthopedics;  Laterality: Left;  . CATARACT EXTRACTION, BILATERAL Bilateral   . CERVICAL SPINE SURGERY     C4-5  . COLONOSCOPY  04/2005   granularity and friability erosions from rectum to 40cm. Bx infection vs IBD. C. Diff positive at the time.   . COLONOSCOPY  05/2010   [facility]: left-sided UC, bx with no dysplasia, shallow diverticula  . COLONOSCOPY [name] 11/07/2012   RWJX:[id number]-WGNFAproctocolitis status post segmental biopsy/Sigmoid colon polyps removed as described above. Procedure compromisd by technical difficulties. bx: Inflammation limited to sigmoid and rectum on pathology.  . COLONOSCOPY  04/2014   [facility] proctocolitis limited to sigmoid  . COLONOSCOPY WITH PROPOFOL [name] 07/29/2017   diverticulosis in colon, three 4-6 mm polyps at splenic flexure and in cecum, one 10 mm polyp in rectum, abnormal rectum and sigmoid consistent with active UC  . CORONARY STENT PLACEMENT  01/1995  . ESOPHAGOGASTRODUODENOSCOPY  07/24/2010   ROZH:YQMVHQesophagus  . ESOPHAGOGASTRODUODENOSCOPY  04/2014   [facility]  . FOOT SURGERY Bilateral two  .  KNEE SURGERY  two  . POLYPECTOMY  07/29/2017   Procedure: POLYPECTOMY;  Surgeon: Daneil Dolin, MD;  [facility];  Service: Endoscopy;;  splenic flexure, ascending colon polyp;rectal  . SHOULDER SURGERY  two  . TONSILLECTOMY    . TRANSTHORACIC ECHOCARDIOGRAM  03/23/2009   EF 60-65%, normal LV systolic function  . ULNAR HEAD EXCISION Left 01/17/2017   Procedure: LEFT ULNAR HEAD RESECTION;  Surgeon: Carole Civil, MD;  Location: [facility];  Service: Orthopedics;  Laterality: Left;    Current Outpatient Medications  Medication Sig Dispense Refill  . acetaminophen (TYLENOL) 325 MG tablet Take 650 mg by mouth every 6 (six) hours as needed for moderate pain or headache.    Marland Kitchen amLODipine (NORVASC) 5 MG tablet TAKE 1 TABLET BY MOUTH ONCE DAILY 90 tablet 1  . aspirin 81 MG tablet Take 162 mg by mouth at bedtime.     Marland Kitchen doxazosin (CARDURA) 8 MG tablet Take 4 mg by mouth at bedtime.     . furosemide (LASIX) 40 MG tablet Take 40 mg by mouth at bedtime.     Marland Kitchen losartan (COZAAR) 25 MG tablet TAKE 1 TABLET BY MOUTH ONCE DAILY 90 tablet 1  . metoprolol tartrate (LOPRESSOR) 50 MG tablet Take 3 tablets (150 mg total) by mouth at bedtime. TAKE ONE TABLET BY MOUTH ONCE DAILY IN THE MORNING AND  2  TABLETS  AT  BEDTIME (Patient taking differently: Take 150 mg by mouth at bedtime. ) 270 tablet 1  . nitroGLYCERIN (NITROSTAT) 0.4 MG SL tablet Place 0.4 mg under the tongue every 5 (five) minutes as needed for chest pain.    Marland Kitchen OVER THE COUNTER MEDICATION Omega XL 2 tablets at night    . OVER THE COUNTER MEDICATION Take 1 tablet by mouth at bedtime. Strong Heart Otc supplement    . pravastatin (PRAVACHOL) 40 MG tablet Take 1 tablet (40 mg total) by mouth daily. (Patient taking differently: Take 40 mg by mouth at bedtime. ) 30 tablet 5  . balsalazide (COLAZAL) 750 MG capsule Take 3 capsules (2,250 mg total) by mouth 3 (three) times daily. 270 capsule 5  . raNITIdine HCl (ACID REDUCER PO) Take 2 tablets by mouth at bedtime.     No current facility-administered medications for this visit.     Allergies as of 10/23/2017  . (No Known Allergies)    Family History  Problem Relation Age of Onset  . Lung cancer Mother   . Cancer Mother        breast  . Diabetes Mother   . Stroke Father   . Hypertension Father   . Colon cancer Neg Hx     Social History   Socioeconomic History  . Marital status: Widowed    Spouse  name: Not on file  . Number of children: Not on file  . Years of education: Not on file  . Highest education level: Not on file  Occupational History  . 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 Financial resource strain: Not on file  . Food insecurity:    Worry: Not on file    Inability: Not on file  . Transportation needs:    Medical: Not on file    Non-medical: Not on file  Tobacco Use  . Smoking status: Former Smoker    Packs/day: 1.00    Years: 20.00    Pack years: 20.00    Types: Cigarettes    Last attempt  to quit: 09/30/1969    Years since quitting: 48.1  . Smokeless tobacco: Never Used  Substance and Sexual Activity  . Alcohol use: No  . Drug use: No  . Sexual activity: Yes    Partners: Female    Birth control/protection: None    Comment: spouse  Lifestyle  . Physical activity:    Days per week: Not on file    Minutes per session: Not on file  . Stress: Not on file  Relationships  . Social connections:    Talks on phone: Not on file    Gets together: Not on file    Attends religious service: Not on file    Active member of club or organization: Not on file    Attends meetings of clubs or organizations: Not on file    Relationship status: Not on file  Other Topics Concern  . Not on file  Social History Narrative  . Not on file    Review of Systems: Gen: Denies fever, chills, anorexia. Denies fatigue, weakness, weight loss.  CV: Denies chest pain, palpitations, syncope, peripheral edema, and claudication. Resp: Denies dyspnea at rest, cough, wheezing, coughing up blood, and pleurisy. GI:see HPI  Derm: Denies rash, itching, dry skin Psych: Denies depression, anxiety, memory loss, confusion. No homicidal or suicidal ideation.  Heme: Denies bruising, bleeding, and enlarged lymph nodes.  Physical Exam: BP (!) 162/72   Pulse 70   Temp (!) 97 F (36.1 C) (Oral)   Ht 6' 1"  (1.854 m)   Wt 290 lb 9.6 oz (131.8 kg)   BMI 38.34 kg/m    General:   Alert and oriented. No distress noted. Pleasant and cooperative.  Head:  Normocephalic and atraumatic. Eyes:  Conjuctiva clear without scleral icterus. Mouth:  Oral mucosa pink and moist.  Abdomen:  +BS, soft but round, non-tender and non-distended. No rebound or guarding. No HSM or masses noted. Msk:  Symmetrical without gross deformities. Normal posture. Extremities:  Without edema. Neurologic:  Alert and  oriented x4 Psych:  Alert and cooperative. Normal mood and affect.  Lab Results  Component Value Date   CREATININE 0.88 07/24/2017   BUN 16 07/24/2017   NA 144 07/24/2017   K 3.6 07/24/2017   CL 109 07/24/2017   CO2 28 07/24/2017

## 2017-10-23 NOTE — Patient Instructions (Signed)
Continue the entocort capsules for one more month.  I am looking into the enemas and the oral medication for best prices.  We will see you in 3 months!  I enjoyed seeing you again today! As you know, I value our relationship and want to provide genuine, compassionate, and quality care. I welcome your feedback. If you receive a survey regarding your visit,  I greatly appreciate you taking time to fill this out. See you next time!  Annitta Needs, PhD, ANP-BC [facility]

## 2017-10-24 ENCOUNTER — Encounter: Payer: Self-pay | Admitting: Internal Medicine

## 2017-10-24 ENCOUNTER — Telehealth: Payer: Self-pay | Admitting: Gastroenterology

## 2017-10-24 MED ORDER — BALSALAZIDE DISODIUM 750 MG PO CAPS: 2250.0000 mg | ORAL_CAPSULE | Freq: Three times a day (TID) | ORAL | 5 refills | Status: DC

## 2017-10-24 NOTE — Telephone Encounter (Signed)
Please let patient know that I called insurance and it sounds like none of the agents are covered. Therefore, I'm sending Colazal to [facility] and we will see what happens. We may need to do a tier exception. Have him continue the 30 days of entocort for now, and we will figure out best agent.

## 2017-10-28 NOTE — Telephone Encounter (Signed)
Lmom, waiting on a return call.  

## 2017-10-28 NOTE — Telephone Encounter (Signed)
Pt returned call and will call back if medication is too expensive. Pt will continue Entocort until finished.

## 2017-10-30 NOTE — Progress Notes (Signed)
cc'd to pcp 

## 2017-10-30 NOTE — Assessment & Plan Note (Signed)
77 year old male with left-sided proctocolitis, recent colonoscopy with active left-sided proctocolitis. Historically has been difficult to manage due to higher copays with insurance plan. He still notes fecal urgency and intermittent loose stool. May have some component of dietary intake that is affecting him (?lactose?) but ultimately I feel he would improve with addition of topical therapy as well. However, he has no one to assist him with retention enemas and declining this.   Will complete a final 4 weeks of entocort, compounded by [employer], while bridging to [employer]. I have sent Colazal to the pharmacy, and he is to call if any issues with co-pay. Return in 3 months or sooner if needed.

## 2017-11-05 ENCOUNTER — Ambulatory Visit (INDEPENDENT_AMBULATORY_CARE_PROVIDER_SITE_OTHER): Payer: Medicare HMO | Admitting: Family Medicine

## 2017-11-05 ENCOUNTER — Encounter: Payer: Self-pay | Admitting: Family Medicine

## 2017-11-05 VITALS — BP 132/82 | Ht 73.0 in | Wt 297.0 lb

## 2017-11-05 DIAGNOSIS — E785 Hyperlipidemia, unspecified: Secondary | ICD-10-CM

## 2017-11-05 DIAGNOSIS — R350 Frequency of micturition: Secondary | ICD-10-CM | POA: Diagnosis not present

## 2017-11-05 DIAGNOSIS — Z23 Encounter for immunization: Secondary | ICD-10-CM

## 2017-11-05 DIAGNOSIS — N401 Enlarged prostate with lower urinary tract symptoms: Secondary | ICD-10-CM

## 2017-11-05 DIAGNOSIS — R600 Localized edema: Secondary | ICD-10-CM

## 2017-11-05 DIAGNOSIS — J019 Acute sinusitis, unspecified: Secondary | ICD-10-CM | POA: Diagnosis not present

## 2017-11-05 DIAGNOSIS — I10 Essential (primary) hypertension: Secondary | ICD-10-CM | POA: Diagnosis not present

## 2017-11-05 MED ORDER — DOXAZOSIN MESYLATE 4 MG PO TABS: 4.0000 mg | ORAL_TABLET | Freq: Every day | ORAL | 5 refills | Status: DC

## 2017-11-05 MED ORDER — FUROSEMIDE 20 MG PO TABS: ORAL_TABLET | ORAL | 3 refills | Status: DC

## 2017-11-05 MED ORDER — AMOXICILLIN 500 MG PO TABS: 500.0000 mg | ORAL_TABLET | Freq: Three times a day (TID) | ORAL | 0 refills | Status: DC

## 2017-11-05 NOTE — Progress Notes (Signed)
Subjective:    Patient ID: [name], male    DOB: March 11, 1940, 77 y.o.   MRN: [id number]  Hypertension  This is a chronic problem. Pertinent negatives include no chest pain, headaches or shortness of breath. There are no compliance problems.   Pt brought medication per provider request.  The patient brought in a whole bag full of medicine was taken to go through all of these are had to be more than 50 bottles we were able to eliminate a lot of old medicine we are also able to review over his current medicines Patient having ongoing troubles with colitis being followed by gastroenterology for this  Patient for blood pressure check up.  The patient does have hypertension.  The patient is on medication.  Patient relates compliance with meds. Todays BP reviewed with the patient. Patient denies issues with medication. Patient relates reasonable diet. Patient tries to minimize salt. Patient aware of BP goals.  Patient here for follow-up regarding cholesterol.  The patient does have hyperlipidemia.  Patient does try to maintain a reasonable diet.  Patient does take the medication on a regular basis.  Denies missing a dose.  The patient denies any obvious side effects.  Prior blood work results reviewed with the patient.  The patient is aware of his cholesterol goals and the need to keep it under good control to lessen the risk of disease.  Patient with BPH uses Cardura to help with this but ran out of this  Patient with head congestion drainage coughing sinus pressure not feeling good over the past several days  Patient also has intermittent pedal edema but denies any orthopnea or PND.   The patient's BMI is calculated.  The patient does have obesity.  The patient does try to some degree staying active and watching diet.  It is in the vital signs and acknowledged.  It is above the recommended BMI for the patient's height and weight.  The patient has been counseled regarding healthy diet,  restricted portions, avoiding excessive carbohydrates/sugary foods, and increase physical activity as health permits.  It is in the patient's best interest to lower the risk of secondary illness including heart disease strokes and cancer by losing weight.  The patient acknowledges this information.  Review of Systems  Constitutional: Negative for activity change, chills, diaphoresis, fatigue and fever.  HENT: Positive for congestion and rhinorrhea. Negative for ear pain.   Eyes: Negative for discharge.  Respiratory: Positive for cough. Negative for shortness of breath and wheezing.   Cardiovascular: Negative for chest pain and leg swelling.  Gastrointestinal: Negative for abdominal pain, diarrhea, nausea and vomiting.  Genitourinary: Positive for frequency. Negative for dysuria.  Musculoskeletal: Negative for arthralgias.  Skin: Negative for color change and rash.  Neurological: Negative for dizziness and headaches.  Psychiatric/Behavioral: Negative for behavioral problems and confusion.       Objective:   Physical Exam  Constitutional: He appears well-developed and well-nourished. No distress.  HENT:  Head: Normocephalic and atraumatic.  Mouth/Throat: Oropharynx is clear and moist. No oropharyngeal exudate.  Eyes: Right eye exhibits no discharge. Left eye exhibits no discharge.  Neck: Normal range of motion. No tracheal deviation present.  Cardiovascular: Normal rate, regular rhythm and normal heart sounds.  No murmur heard. Pulmonary/Chest: Effort normal and breath sounds normal. No respiratory distress. He has no wheezes.  Musculoskeletal: He exhibits no edema.  Lymphadenopathy:    He has no cervical adenopathy.  Neurological: He is alert. He exhibits normal muscle tone.  Coordination normal.  Skin: Skin is warm and dry.  Psychiatric: He has a normal mood and affect. His behavior is normal.  Nursing note and vitals reviewed.         Assessment & Plan:  HTN- Patient was  seen today as part of a visit regarding hypertension. The importance of healthy diet and regular physical activity was discussed. The importance of compliance with medications discussed.  Ideal goal is to keep blood pressure low elevated levels certainly below 099/83 when possible.  The patient was counseled that keeping blood pressure under control lessen his risk of complications.  The importance of regular follow-ups was discussed with the patient.  Low-salt diet such as DASH recommended.  Regular physical activity was recommended as well.  Patient was advised to keep regular follow-ups.  The patient was seen today as part of an evaluation regarding hyperlipidemia.  Recent lab work has been reviewed with the patient as well as the goals for good cholesterol care.  In addition to this medications have been discussed the importance of compliance with diet and medications discussed as well.  Finally the patient is aware that poor control of cholesterol, noncompliance can dramatically increase the risk of complications. The patient will keep regular office visits and the patient does agreed to periodic lab work.  BPH uses Cardura for this tolerating it well  Patient does have morbid obesity he was encouraged to watch diet try to lose weight Patient was seen today for upper respiratory illness. It is felt that the patient is dealing with sinusitis.  Antibiotics were prescribed today. Importance of compliance with medication was discussed.  Symptoms should gradually resolve over the course of the next several days. If high fevers, progressive illness, difficulty breathing, worsening condition or failure for symptoms to improve over the next several days then the patient is to follow-up.  If any emergent conditions the patient is to follow-up in the emergency department otherwise to follow-up in the office.  Also did de- prescribing eliminated more than 30 medicines from his medicine bag these  would be disposed of at [place] [employer] with the drug drop off

## 2017-11-07 ENCOUNTER — Telehealth: Payer: Self-pay

## 2017-11-07 NOTE — Telephone Encounter (Signed)
Received letter in the mail form [place] that the financial assistance for Uceris has been denied. They also informed pt.  Forwarding FYI to Roseanne Kaufman, NP and Elmo Putt ( RMR pt).

## 2017-11-12 NOTE — Telephone Encounter (Signed)
Yes, we had known this from previously. We had to have compounded entocort from [place], which he will be finishing up soon. I had sent in Colazal for him. I hope this has been obtained without any issues.

## 2017-11-13 NOTE — Telephone Encounter (Signed)
Noted  

## 2017-12-02 DIAGNOSIS — L218 Other seborrheic dermatitis: Secondary | ICD-10-CM | POA: Diagnosis not present

## 2017-12-02 DIAGNOSIS — L820 Inflamed seborrheic keratosis: Secondary | ICD-10-CM | POA: Diagnosis not present

## 2017-12-02 DIAGNOSIS — D225 Melanocytic nevi of trunk: Secondary | ICD-10-CM | POA: Diagnosis not present

## 2017-12-10 ENCOUNTER — Encounter: Payer: Medicare HMO | Attending: Family Medicine | Admitting: Nutrition

## 2017-12-10 ENCOUNTER — Encounter: Payer: Self-pay | Admitting: Nutrition

## 2017-12-10 DIAGNOSIS — I10 Essential (primary) hypertension: Secondary | ICD-10-CM

## 2017-12-10 DIAGNOSIS — E782 Mixed hyperlipidemia: Secondary | ICD-10-CM

## 2017-12-10 NOTE — Progress Notes (Signed)
Medical Nutrition Therapy:  Appt start time: 1000  end time:  1030 Assessment:  Primary concerns today: Overweight, Prediabetes and elevated TG's. He is here with his male friend..  Gained 3 lbs. Has gained 10 lbs in the last 2 months. He notes he eats 1-2 meals per day. Snacks some.  Sees Dr. Dudley Major for his GI issues of colitis. He won't go out of the house much due to risk of loose bowels. He takes Omega XL and takes a heart pill OTC. He notes he takes 18 pills at night before he goes to bed of his prescribed medicines and OTC pills also. Doesn't take any of them in the am.   He notes his biggest problem is he doesn't cook anymore and eats whatever is easy. His daughters are helping prepare some meals but don't know what he should eat. I spoke with his Daughter, [name] on phone to discuss meal planning ideas. She is willing to help make plates and fix better balanced meals for him that he can microwave. He notes that would be helpful a lot.  Eats a lot of processed meats; hot sausages, sausage, ham. Cheese, chips, snacks. He notes he loves cheese    He complains of SOB at times. Legs have some swelling in them around his socks. He hasn't seen his cardiologist in over a year. He will make appt to go back to him to evalute his SOB and swelling in his legs. Cardiologist is Quay Burow in [place].      No recent labs since June 2019. He does report being depressed due to the death of his youngest daughter. He will talk to his other daughters and Dr. Wolfgang Phoenix to address his possible depression. He says all he does is eat and sleep.  Not able to exercise from chronic fatigue. Encouraged him to talk to PCP about possible depression and sleeping issues. Wt Readings from Last 3 Encounters:  12/10/17 300 lb (136.1 kg)  11/05/17 297 lb (134.7 kg)  10/23/17 290 lb 9.6 oz (131.8 kg)   Ht Readings from Last 3 Encounters:  12/10/17 _0  (1.854 m)  11/05/17 _1  (1.854 m)  10/23/17 _2  (1.854 m)   Body  mass index is 39.58 kg/m. _3 @ Facility age limit for growth percentiles is 20 years. Facility age limit for growth percentiles is 20 years.  Lab Results  Component Value Date   HGBA1C 5.8 11/18/2014   CMP Latest Ref Rng & Units 07/24/2017 06/18/2017 01/15/2017  Glucose 65 - 99 mg/dL 94 109(H) 93  BUN 6 - 20 mg/dL _4 Creatinine 0.61 - 1.24 mg/dL 0.88 0.91 0.86  Sodium 135 - 145 mmol/L 144 148(H) 140  Potassium 3.5 - 5.1 mmol/L 3.6 4.3 3.9  Chloride 101 - 111 mmol/L 109 104 106  CO2 22 - 32 mmol/L _5 Calcium 8.9 - 10.3 mg/dL 9.5 9.8 9.3  Total Protein 6.0 - 8.5 g/dL - - -  Total Bilirubin 0.0 - 1.2 mg/dL - - -  Alkaline Phos 39 - 117 IU/L - - -  AST 0 - 40 IU/L - - -  ALT 0 - 44 IU/L - - -    Lipid Panel     Component Value Date/Time   CHOL 171 08/07/2016 0847   TRIG 230 (H) 08/07/2016 0847   HDL 49 08/07/2016 0847   CHOLHDL 3.5 08/07/2016 0847   CHOLHDL 3.2 06/08/2015 0907   VLDL 33 (H) 06/08/2015 0907   LDLCALC  76 08/07/2016 0847   Preferred Learning Style:    No preference indicated   Learning Readiness:   Ready  Change in progress   MEDICATIONS: ADL   DIETARY INTAKE: B) Skip  L Butter beans and mac/cheese, water or Regular soda D)  Skipped. Usual physical activity: ADL  Estimated energy needs: 1800  calories 200 g carbohydrates 135 g protein 50 g fat  Progress Towards Goal(s):  In progress.   Nutritional Diagnosis:  NI-1.5 Excessive energy intake As related to HIgh Fat high Salt HIgh Calorie Diet.  As evidenced by Obesity, BMI > 40 and Prediabetes.    Intervention:  Nutrition. Predidabetes and Weight Loss education provided on My Plate, CHO counting, meal planning, portion sizes, timing of meals, avoiding snacks between meals , target ranges for A1C and blood sugars, taking medications as prescribed, benefits of exercising 30 minutes per day and prevention of  DM. High Fiber Low Fat Low Sodium Diet   Goals 1. Eat three meals per  day 2. Cut out salty processed foods; bacon, sausage, biscuits, hot dogs, hot sausages, cheese. 3. Increase fresh fruits and vegetables. 4, Keep drinking water Make appt with Cardiologist to evaluate swelling in legs and SOB. Lose 5 lbs in the next month. Keep appt in Dec and bring your daughters with you.   Teaching Method Utilized:  Visual Auditory Hands on  Handouts given during visit include:  The Plate Method  Meal Plan Card Pre-Diabetes Instructions Weight loss tips Barriers to learning/adherence to lifestyle change: none  Demonstrated degree of understanding via:  Teach Back   Monitoring/Evaluation:  Dietary intake, exercise, meal planning, and body weight in 6 months. Consider rechecking A1C and referral for re-evaluating his CPAP machine. Needs evaluation for depression.

## 2017-12-10 NOTE — Patient Instructions (Addendum)
Goals 1. Eat three meals per day 2. Cut out salty processed foods; bacon, sausage, biscuits, hot dogs, hot sausages, cheese. 3. Increase fresh fruits and vegetables. 4, Keep drinking water Make appt with Cardiologist to evaluate swelling in legs and SOB. Lose 5 lbs in the next month. Keep appt in Dec and bring your daughters with you.

## 2018-01-15 ENCOUNTER — Ambulatory Visit: Payer: Medicare HMO | Admitting: Nutrition

## 2018-01-23 ENCOUNTER — Other Ambulatory Visit: Payer: Self-pay | Admitting: Family Medicine

## 2018-02-06 ENCOUNTER — Ambulatory Visit: Payer: Medicare HMO | Admitting: Gastroenterology

## 2018-02-27 ENCOUNTER — Other Ambulatory Visit: Payer: Self-pay | Admitting: Family Medicine

## 2018-03-11 DIAGNOSIS — N529 Male erectile dysfunction, unspecified: Secondary | ICD-10-CM | POA: Diagnosis not present

## 2018-03-11 DIAGNOSIS — E785 Hyperlipidemia, unspecified: Secondary | ICD-10-CM | POA: Diagnosis not present

## 2018-03-11 DIAGNOSIS — R69 Illness, unspecified: Secondary | ICD-10-CM | POA: Diagnosis not present

## 2018-03-11 DIAGNOSIS — G4733 Obstructive sleep apnea (adult) (pediatric): Secondary | ICD-10-CM | POA: Diagnosis not present

## 2018-03-11 DIAGNOSIS — R32 Unspecified urinary incontinence: Secondary | ICD-10-CM | POA: Diagnosis not present

## 2018-03-11 DIAGNOSIS — R52 Pain, unspecified: Secondary | ICD-10-CM | POA: Diagnosis not present

## 2018-03-11 DIAGNOSIS — I10 Essential (primary) hypertension: Secondary | ICD-10-CM | POA: Diagnosis not present

## 2018-03-11 DIAGNOSIS — I25119 Atherosclerotic heart disease of native coronary artery with unspecified angina pectoris: Secondary | ICD-10-CM | POA: Diagnosis not present

## 2018-03-11 DIAGNOSIS — R609 Edema, unspecified: Secondary | ICD-10-CM | POA: Diagnosis not present

## 2018-03-12 ENCOUNTER — Ambulatory Visit (HOSPITAL_COMMUNITY)
Admission: RE | Admit: 2018-03-12 | Discharge: 2018-03-12 | Disposition: A | Discharge status: Home / Self Care | Payer: Medicare HMO | Source: Ambulatory Visit | Attending: Family Medicine | Admitting: Family Medicine

## 2018-03-12 ENCOUNTER — Encounter: Payer: Self-pay | Admitting: Cardiovascular Disease

## 2018-03-12 ENCOUNTER — Encounter: Payer: Self-pay | Admitting: Family Medicine

## 2018-03-12 ENCOUNTER — Ambulatory Visit: Payer: Medicare HMO | Admitting: Cardiovascular Disease

## 2018-03-12 ENCOUNTER — Ambulatory Visit (INDEPENDENT_AMBULATORY_CARE_PROVIDER_SITE_OTHER): Payer: Medicare HMO | Admitting: Family Medicine

## 2018-03-12 ENCOUNTER — Telehealth: Payer: Self-pay | Admitting: Family Medicine

## 2018-03-12 VITALS — BP 128/58 | HR 84 | Ht 73.0 in | Wt 302.8 lb

## 2018-03-12 VITALS — BP 148/54 | Ht 73.0 in | Wt 302.0 lb

## 2018-03-12 DIAGNOSIS — K529 Noninfective gastroenteritis and colitis, unspecified: Secondary | ICD-10-CM

## 2018-03-12 DIAGNOSIS — K5150 Left sided colitis without complications: Secondary | ICD-10-CM

## 2018-03-12 DIAGNOSIS — R0609 Other forms of dyspnea: Secondary | ICD-10-CM | POA: Diagnosis not present

## 2018-03-12 DIAGNOSIS — R0600 Dyspnea, unspecified: Secondary | ICD-10-CM

## 2018-03-12 DIAGNOSIS — R296 Repeated falls: Secondary | ICD-10-CM

## 2018-03-12 DIAGNOSIS — E785 Hyperlipidemia, unspecified: Secondary | ICD-10-CM | POA: Diagnosis not present

## 2018-03-12 DIAGNOSIS — R079 Chest pain, unspecified: Secondary | ICD-10-CM

## 2018-03-12 DIAGNOSIS — I10 Essential (primary) hypertension: Secondary | ICD-10-CM | POA: Diagnosis not present

## 2018-03-12 DIAGNOSIS — E66813 Obesity, class 3: Secondary | ICD-10-CM | POA: Insufficient documentation

## 2018-03-12 DIAGNOSIS — Z79899 Other long term (current) drug therapy: Secondary | ICD-10-CM

## 2018-03-12 DIAGNOSIS — F322 Major depressive disorder, single episode, severe without psychotic features: Secondary | ICD-10-CM

## 2018-03-12 DIAGNOSIS — R69 Illness, unspecified: Secondary | ICD-10-CM | POA: Diagnosis not present

## 2018-03-12 DIAGNOSIS — R7303 Prediabetes: Secondary | ICD-10-CM

## 2018-03-12 DIAGNOSIS — R0602 Shortness of breath: Secondary | ICD-10-CM | POA: Diagnosis not present

## 2018-03-12 DIAGNOSIS — R270 Ataxia, unspecified: Secondary | ICD-10-CM

## 2018-03-12 DIAGNOSIS — G4733 Obstructive sleep apnea (adult) (pediatric): Secondary | ICD-10-CM

## 2018-03-12 HISTORY — DX: Morbid (severe) obesity due to excess calories: E66.01

## 2018-03-12 MED ORDER — AZITHROMYCIN 250 MG PO TABS
ORAL_TABLET | ORAL | 0 refills | Status: DC
Start: 2018-03-12 — End: 2018-03-18

## 2018-03-12 MED ORDER — SERTRALINE HCL 50 MG PO TABS: 50.0000 mg | ORAL_TABLET | Freq: Every day | ORAL | 5 refills | Status: DC

## 2018-03-12 MED ORDER — METOPROLOL TARTRATE 50 MG PO TABS: ORAL_TABLET | ORAL | 5 refills | Status: DC

## 2018-03-12 MED ORDER — AMLODIPINE BESYLATE 2.5 MG PO TABS: 2.5000 mg | ORAL_TABLET | Freq: Every day | ORAL | 5 refills | Status: DC

## 2018-03-12 NOTE — Assessment & Plan Note (Signed)
History of CAD status post LAD PCI and stenting by myself January 28, 1995 in the setting of acute anterior wall myocardial infarction.  His last Myoview stress test performed 07/21/2009 was nonischemic.  He does complain of occasional chest pain.  I am going to repeat an pharmacologic Myoview stress test to further evaluate.

## 2018-03-12 NOTE — Progress Notes (Signed)
03/12/2018 [name]   1940/10/14  [id number]  Primary Physician [name], MD Primary Cardiologist: Lorretta Harp MD FACP, [place], [place], [place]  HPI:  [name] is a 78 y.o.  moderately overweight, widowed Caucasian male, father of 2 living daughters (oldest daughter recently died at age 78), grandfather of 25 grandchildren, whom I last saw on 01/07/2014. He has a history of CAD, status post PCI and stenting of his LAD by me on January 28, 1995, in the setting of an acute anterior wall myocardial infarction. His other problems include hypertension, hyperlipidemia, and obstructive sleep apnea on CPAP. He denies chest pain or shortness of breath. He had a 2D echocardiogram in 2007 which revealed normal LV function. His last Myoview performed on July 21, 2009, was normal.Dr. Wolfgang Phoenix  follows his lipid profile which most recently performed 08/07/2016 revealed a total cholesterol 171, LDL of 76 and HDL 49.  Since I saw him 4 years ago he has gained 30 pounds.  He complains of increasing dyspnea on exertion.  Does get occasional chest pain.   Current Meds  Medication Sig  . acetaminophen (TYLENOL) 325 MG tablet Take 650 mg by mouth every 6 (six) hours as needed for moderate pain or headache.  Marland Kitchen amLODipine (NORVASC) 5 MG tablet TAKE 1 TABLET BY MOUTH ONCE DAILY  . aspirin 81 MG tablet Take 162 mg by mouth at bedtime.   Marland Kitchen doxazosin (CARDURA) 4 MG tablet Take 1 tablet (4 mg total) by mouth at bedtime.  . furosemide (LASIX) 20 MG tablet One q am prn pedal edema  . losartan (COZAAR) 25 MG tablet TAKE 1 TABLET BY MOUTH ONCE DAILY  . metoprolol tartrate (LOPRESSOR) 50 MG tablet Take 3 tablets (150 mg total) by mouth at bedtime.  . nitroGLYCERIN (NITROSTAT) 0.4 MG SL tablet Place 0.4 mg under the tongue every 5 (five) minutes as needed for chest pain.  Marland Kitchen OVER THE COUNTER MEDICATION Omega XL 2 tablets at night  . OVER THE COUNTER MEDICATION Take 1 tablet by mouth at bedtime. Strong  Heart Otc supplement  . pravastatin (PRAVACHOL) 40 MG tablet Take 1 tablet (40 mg total) by mouth at bedtime.  . raNITIdine HCl (ACID REDUCER PO) Take 2 tablets by mouth at bedtime.     No Known Allergies  Social History   Socioeconomic History  . Marital status: Widowed    Spouse name: Not on file  . Number of children: Not on file  . Years of education: Not on file  . Highest education level: Not on file  Occupational History  . 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 Financial resource strain: Not on file  . Food insecurity:    Worry: Not on file    Inability: Not on file  . Transportation needs:    Medical: Not on file    Non-medical: Not on file  Tobacco Use  . Smoking status: Former Smoker    Packs/day: 1.00    Years: 20.00    Pack years: 20.00    Types: Cigarettes    Last attempt to quit: 09/30/1969    Years since quitting: 48.4  . Smokeless tobacco: Never Used  Substance and Sexual Activity  . Alcohol use: No  . Drug use: No  . Sexual activity: Yes    Partners: Female    Birth control/protection: None    Comment: spouse  Lifestyle  . Physical activity:    Days per  week: Not on file    Minutes per session: Not on file  . Stress: Not on file  Relationships  . Social connections:    Talks on phone: Not on file    Gets together: Not on file    Attends religious service: Not on file    Active member of club or organization: Not on file    Attends meetings of clubs or organizations: Not on file    Relationship status: Not on file  . Intimate partner violence:    Fear of current or ex partner: Not on file    Emotionally abused: Not on file    Physically abused: Not on file    Forced sexual activity: Not on file  Other Topics Concern  . Not on file  Social History Narrative  . Not on file     Review of Systems: General: negative for chills, fever, night sweats or weight changes.  Cardiovascular: negative for chest pain, dyspnea  on exertion, edema, orthopnea, palpitations, paroxysmal nocturnal dyspnea or shortness of breath Dermatological: negative for rash Respiratory: negative for cough or wheezing Urologic: negative for hematuria Abdominal: negative for nausea, vomiting, diarrhea, bright red blood per rectum, melena, or hematemesis Neurologic: negative for visual changes, syncope, or dizziness All other systems reviewed and are otherwise negative except as noted above.    Blood pressure (!) 128/58, pulse 84, height 6' 1"  (1.854 m), weight (!) 302 lb 12.8 oz (137.3 kg), SpO2 90 %.  General appearance: alert and no distress Neck: no adenopathy, no carotid bruit, no JVD, supple, symmetrical, trachea midline and thyroid not enlarged, symmetric, no tenderness/mass/nodules Lungs: clear to auscultation bilaterally Heart: regular rate and rhythm, S1, S2 normal, no murmur, click, rub or gallop Extremities: extremities normal, atraumatic, no cyanosis or edema Pulses: 2+ and symmetric Skin: Skin color, texture, turgor normal. No rashes or lesions Neurologic: Alert and oriented X 3, normal strength and tone. Normal symmetric reflexes. Normal coordination and gait  EKG not performed today  ASSESSMENT AND PLAN:   Obstructive sleep apnea History of obstructive sleep apnea on CPAP  CAD (coronary artery disease), native coronary artery History of CAD status post LAD PCI and stenting by myself January 28, 1995 in the setting of acute anterior wall myocardial infarction.  His last Myoview stress test performed 07/21/2009 was nonischemic.  He does complain of occasional chest pain.  I am going to repeat an pharmacologic Myoview stress test to further evaluate.  Essential hypertension History of essential hypertension her blood pressure measured today 128/58.  He is on amlodipine, Cardura, losartan and metoprolol.  Hyperlipidemia History of hyperlipidemia on statin therapy with lipid profile performed 08/07/2016 revealing  total cholesterol 171, LDL 76 and HDL 49.  Dyspnea on exertion Of increasing dyspnea on exertion.  He has gained 25 to 30 pound since I saw him 5 years ago.  He stopped smoking remotely at the time of his prior myocardial infarction.  I am going get a 2D echo to further evaluate.      Lorretta Harp MD FACP,FACC,FAHA, [facility] 03/12/2018 10:26 AM

## 2018-03-12 NOTE — Telephone Encounter (Signed)
Pt's daughter calling to let Dr. Nicki Reaper know she contacted [employer] and they need a pre certification code (diagnosis code). Once she has that to give to [place] they will move forward with trying to get him approved for the sitter. Also she is wanting a pre certification code for in home therapy as well.   CB# [phone].

## 2018-03-12 NOTE — Assessment & Plan Note (Signed)
History of essential hypertension her blood pressure measured today 128/58.  He is on amlodipine, Cardura, losartan and metoprolol.

## 2018-03-12 NOTE — Patient Instructions (Signed)
Medication Instructions:  NONE If you need a refill on your cardiac medications before your next appointment, please call your pharmacy.   Lab work: NONE If you have labs (blood work) drawn today and your tests are completely normal, you will receive your results only by: Marland Kitchen MyChart Message (if you have MyChart) OR . A paper copy in the mail If you have any lab test that is abnormal or we need to change your treatment, we will call you to review the results.  Testing/Procedures: Your physician has requested that you have a lexiscan myoview. For further information please visit [email or web address]. Please follow instruction sheet, as given.  Your physician has requested that you have an echocardiogram. Echocardiography is a painless test that uses sound waves to create images of your heart. It provides your doctor with information about the size and shape of your heart and how well your heart's chambers and valves are working. This procedure takes approximately one hour. There are no restrictions for this procedure.  [place], [place], [place] [address]    Follow-Up: At [facility], you and your health needs are our priority.  As part of our continuing mission to provide you with exceptional heart care, we have created designated Provider Care Teams.  These Care Teams include your primary Cardiologist (physician) and Advanced Practice Providers (APPs -  Physician Assistants and Nurse Practitioners) who all work together to provide you with the care you need, when you need it. . You will need a follow up appointment in 6 months with an APP and 12 months with Dr. Gwenlyn Found.  Please call our office 2 months in advance to schedule this appointment.  You may see Dr. Gwenlyn Found or one of the following Advanced Practice Providers on your designated Care Team:   . Kerin Ransom, [place] . Almyra Deforest, PA-C . Fabian Sharp, PA-C . Jory Sims, DNP . Rosaria Ferries, PA-C . Roby Lofts,  PA-C . Sande Rives, PA-C

## 2018-03-12 NOTE — Assessment & Plan Note (Signed)
History of hyperlipidemia on statin therapy with lipid profile performed 08/07/2016 revealing total cholesterol 171, LDL 76 and HDL 49.

## 2018-03-12 NOTE — Progress Notes (Signed)
Subjective:    Patient ID: [name], male    DOB: Apr 02, 1940, 78 y.o.   MRN: [id number]  HPI Patient is here today to follow up on his reoccurring falls as of late.  Patient is here with his daughter and Anabel Halon. She wanted to see if she could get the patient some home health to help with every day things.  Wants medications bubble wrapped. Very complex patient Having a very difficult time with medications ataxia weakness incontinence encopresis.  In addition to this morbidly obese with COPD heart issues frail frequent falling getting dizzy when he stands up losing his balance when he bends over he has multiple health problems.  Unfortunately not able to document every single issue that was discussed today but nonetheless a large discussion was held today regarding his physical condition and review of his medication review of recent labs and tests please see below for conclusions.  Review of Systems  Constitutional: Negative for diaphoresis and fatigue.  HENT: Negative for congestion and rhinorrhea.   Respiratory: Positive for shortness of breath. Negative for cough.   Cardiovascular: Positive for leg swelling. Negative for chest pain.  Gastrointestinal: Negative for abdominal pain and diarrhea.  Skin: Negative for color change and rash.  Neurological: Positive for weakness. Negative for dizziness and headaches.  Psychiatric/Behavioral: Negative for behavioral problems and confusion.       Objective:   Physical Exam Vitals signs reviewed.  Constitutional:      General: He is not in acute distress. HENT:     Head: Normocephalic and atraumatic.  Eyes:     General:        Right eye: No discharge.        Left eye: No discharge.  Neck:     Trachea: No tracheal deviation.  Cardiovascular:     Rate and Rhythm: Normal rate and regular rhythm.     Heart sounds: Normal heart sounds. No murmur.  Pulmonary:     Effort: Pulmonary effort is normal. No respiratory  distress.     Breath sounds: Normal breath sounds.  Musculoskeletal:        General: Swelling present.  Lymphadenopathy:     Cervical: No cervical adenopathy.  Skin:    General: Skin is warm and dry.  Neurological:     Mental Status: He is alert.     Coordination: Coordination normal.  Psychiatric:        Behavior: Behavior normal.           Assessment & Plan:  Frequent falls/ataxia/muscle weakness-this patient would benefit from gait training.  He does use a walker.  He is very unstable with high risk of falling I recommend physical therapy for this patient.  Also recommend strengthening.  Orthostatic hypotension.  We will reduce the metoprolol down to just 1 tablet each evening.  That would be 50 mg/day.  Currently he is not taking the Cardura I did encourage him to hold off on this. Reduce amlodipine down to 2.5 mg daily.  He is to move forward with his appointment with cardiology for testing next week and follow-up with [place] in 2 weeks  Severe COPD.  We need to do up-to-date pulmonary function testing.  Chest x-ray ordered as well because of chest congestion today he was placed on an antibiotic  Acute rhinosinusitis along with COPD exacerbation antibiotic prescribed  Colitis-patient has not been always able to take medication because he cannot afford it he does need a follow-up visit with  gastroenterology.  He has not done this in a while.  We will help set him back up with gastroenterology.  Major severe depression.  I encouraged family to take the guns out of the house.  Also encouraged him to get started on sertraline 50 mg daily follow-up in 2 weeks referral to psychiatry for counseling recommend socialization  Because of the patient's debilitated condition I recommend no driving other than daytime driving local no interstate no for [place].  I also encouraged the family member to ride with her dad to verify that he can safely drive if he is unable to safely drive he needs to  not drive at all.  Currently right now the daughter is doing the driving for him which I stated would be a wise idea  Prediabetes check lab work await results watch starches in diet minimize diet  Morbid obesity-Patient encouraged to try to watch diet lose weight unable to exercise much because of this physical condition  This patient would benefit from having an aide to help him around the house with organization of medications fixing of food and helping with chores around the house he lives by himself without a aid more than likely he would need to go into a assisted living or nursing home for which the patient states he does not want to do  45 to 50 minutes spent with the patient greater than half in discussion of all these issues and discussion with the daughter

## 2018-03-12 NOTE — Assessment & Plan Note (Signed)
History of obstructive sleep apnea on CPAP. 

## 2018-03-12 NOTE — Assessment & Plan Note (Signed)
Of increasing dyspnea on exertion.  He has gained 25 to 30 pound since I saw him 5 years ago.  He stopped smoking remotely at the time of his prior myocardial infarction.  I am going get a 2D echo to further evaluate.

## 2018-03-13 ENCOUNTER — Telehealth (HOSPITAL_COMMUNITY): Payer: Self-pay

## 2018-03-13 ENCOUNTER — Encounter: Payer: Self-pay | Admitting: Family Medicine

## 2018-03-13 LAB — CBC WITH DIFFERENTIAL/PLATELET
Basophils Absolute: 0.1 10*3/uL (ref 0.0–0.2)
Basos: 1 %
EOS (ABSOLUTE): 0.1 10*3/uL (ref 0.0–0.4)
Eos: 1 %
Hematocrit: 42.8 % (ref 37.5–51.0)
Hemoglobin: 14.3 g/dL (ref 13.0–17.7)
Immature Grans (Abs): 0 10*3/uL (ref 0.0–0.1)
Immature Granulocytes: 0 %
Lymphocytes Absolute: 3.7 10*3/uL — ABNORMAL HIGH (ref 0.7–3.1)
Lymphs: 32 %
MCH: 29.3 pg (ref 26.6–33.0)
MCHC: 33.4 g/dL (ref 31.5–35.7)
MCV: 88 fL (ref 79–97)
Monocytes Absolute: 1.2 10*3/uL — ABNORMAL HIGH (ref 0.1–0.9)
Monocytes: 10 %
Neutrophils Absolute: 6.5 10*3/uL (ref 1.4–7.0)
Neutrophils: 56 %
Platelets: 247 10*3/uL (ref 150–450)
RBC: 4.88 x10E6/uL (ref 4.14–5.80)
RDW: 13.2 % (ref 11.6–15.4)
WBC: 11.7 10*3/uL — ABNORMAL HIGH (ref 3.4–10.8)

## 2018-03-13 LAB — HEMOGLOBIN A1C
Est. average glucose Bld gHb Est-mCnc: 128 mg/dL
Hgb A1c MFr Bld: 6.1 % — ABNORMAL HIGH (ref 4.8–5.6)

## 2018-03-13 LAB — HEPATIC FUNCTION PANEL
ALT: 20 IU/L (ref 0–44)
AST: 48 IU/L — ABNORMAL HIGH (ref 0–40)
Albumin: 3.4 g/dL — ABNORMAL LOW (ref 3.7–4.7)
Alkaline Phosphatase: 69 IU/L (ref 39–117)
Bilirubin Total: 0.4 mg/dL (ref 0.0–1.2)
Bilirubin, Direct: 0.15 mg/dL (ref 0.00–0.40)
Total Protein: 6.8 g/dL (ref 6.0–8.5)

## 2018-03-13 LAB — LIPID PANEL
Chol/HDL Ratio: 2.9 ratio (ref 0.0–5.0)
Cholesterol, Total: 130 mg/dL (ref 100–199)
HDL: 45 mg/dL (ref >39)
LDL Calculated: 49 mg/dL (ref 0–99)
Triglycerides: 178 mg/dL — ABNORMAL HIGH (ref 0–149)
VLDL Cholesterol Cal: 36 mg/dL (ref 5–40)

## 2018-03-13 LAB — BASIC METABOLIC PANEL
BUN/Creatinine Ratio: 18 (ref 10–24)
BUN: 20 mg/dL (ref 8–27)
CO2: 19 mmol/L — ABNORMAL LOW (ref 20–29)
Calcium: 9.2 mg/dL (ref 8.6–10.2)
Chloride: 105 mmol/L (ref 96–106)
Creatinine, Ser: 1.09 mg/dL (ref 0.76–1.27)
GFR calc Af Amer: 74 mL/min/{1.73_m2} (ref >59)
GFR calc non Af Amer: 64 mL/min/{1.73_m2} (ref >59)
Glucose: 92 mg/dL (ref 65–99)
Potassium: 4.3 mmol/L (ref 3.5–5.2)
Sodium: 140 mmol/L (ref 134–144)

## 2018-03-13 LAB — BRAIN NATRIURETIC PEPTIDE: BNP: 82.1 pg/mL (ref 0.0–100.0)

## 2018-03-13 NOTE — Telephone Encounter (Signed)
Encounter complete. 

## 2018-03-14 ENCOUNTER — Other Ambulatory Visit: Payer: Self-pay

## 2018-03-14 ENCOUNTER — Inpatient Hospital Stay (HOSPITAL_COMMUNITY)
Admission: EM | Admit: 2018-03-14 | Discharge: 2018-03-18 | DRG: 291 | Disposition: A | Discharge status: Home Health | Payer: Medicare HMO | Attending: Family Medicine | Admitting: Family Medicine

## 2018-03-14 ENCOUNTER — Encounter: Payer: Self-pay | Admitting: Family Medicine

## 2018-03-14 ENCOUNTER — Encounter (HOSPITAL_COMMUNITY): Payer: Self-pay

## 2018-03-14 DIAGNOSIS — J9601 Acute respiratory failure with hypoxia: Secondary | ICD-10-CM | POA: Diagnosis not present

## 2018-03-14 DIAGNOSIS — Z79899 Other long term (current) drug therapy: Secondary | ICD-10-CM

## 2018-03-14 DIAGNOSIS — J449 Chronic obstructive pulmonary disease, unspecified: Secondary | ICD-10-CM | POA: Diagnosis present

## 2018-03-14 DIAGNOSIS — Z86010 Personal history of colonic polyps: Secondary | ICD-10-CM

## 2018-03-14 DIAGNOSIS — Z87891 Personal history of nicotine dependence: Secondary | ICD-10-CM

## 2018-03-14 DIAGNOSIS — J441 Chronic obstructive pulmonary disease with (acute) exacerbation: Secondary | ICD-10-CM

## 2018-03-14 DIAGNOSIS — Z8249 Family history of ischemic heart disease and other diseases of the circulatory system: Secondary | ICD-10-CM

## 2018-03-14 DIAGNOSIS — I110 Hypertensive heart disease with heart failure: Principal | ICD-10-CM | POA: Diagnosis present

## 2018-03-14 DIAGNOSIS — G4733 Obstructive sleep apnea (adult) (pediatric): Secondary | ICD-10-CM | POA: Diagnosis present

## 2018-03-14 DIAGNOSIS — K219 Gastro-esophageal reflux disease without esophagitis: Secondary | ICD-10-CM | POA: Diagnosis present

## 2018-03-14 DIAGNOSIS — R0600 Dyspnea, unspecified: Secondary | ICD-10-CM | POA: Diagnosis not present

## 2018-03-14 DIAGNOSIS — Z801 Family history of malignant neoplasm of trachea, bronchus and lung: Secondary | ICD-10-CM

## 2018-03-14 DIAGNOSIS — Z7982 Long term (current) use of aspirin: Secondary | ICD-10-CM

## 2018-03-14 DIAGNOSIS — N401 Enlarged prostate with lower urinary tract symptoms: Secondary | ICD-10-CM | POA: Diagnosis present

## 2018-03-14 DIAGNOSIS — R0602 Shortness of breath: Secondary | ICD-10-CM | POA: Diagnosis not present

## 2018-03-14 DIAGNOSIS — R0609 Other forms of dyspnea: Secondary | ICD-10-CM | POA: Diagnosis present

## 2018-03-14 DIAGNOSIS — Z87442 Personal history of urinary calculi: Secondary | ICD-10-CM

## 2018-03-14 DIAGNOSIS — Z8719 Personal history of other diseases of the digestive system: Secondary | ICD-10-CM

## 2018-03-14 DIAGNOSIS — Z8711 Personal history of peptic ulcer disease: Secondary | ICD-10-CM

## 2018-03-14 DIAGNOSIS — E785 Hyperlipidemia, unspecified: Secondary | ICD-10-CM | POA: Diagnosis present

## 2018-03-14 DIAGNOSIS — I10 Essential (primary) hypertension: Secondary | ICD-10-CM | POA: Diagnosis not present

## 2018-03-14 DIAGNOSIS — Z833 Family history of diabetes mellitus: Secondary | ICD-10-CM

## 2018-03-14 DIAGNOSIS — Z841 Family history of disorders of kidney and ureter: Secondary | ICD-10-CM

## 2018-03-14 DIAGNOSIS — I252 Old myocardial infarction: Secondary | ICD-10-CM

## 2018-03-14 DIAGNOSIS — F329 Major depressive disorder, single episode, unspecified: Secondary | ICD-10-CM | POA: Diagnosis present

## 2018-03-14 DIAGNOSIS — I2510 Atherosclerotic heart disease of native coronary artery without angina pectoris: Secondary | ICD-10-CM | POA: Diagnosis present

## 2018-03-14 DIAGNOSIS — K529 Noninfective gastroenteritis and colitis, unspecified: Secondary | ICD-10-CM | POA: Diagnosis present

## 2018-03-14 DIAGNOSIS — I5033 Acute on chronic diastolic (congestive) heart failure: Secondary | ICD-10-CM | POA: Diagnosis present

## 2018-03-14 DIAGNOSIS — Z955 Presence of coronary angioplasty implant and graft: Secondary | ICD-10-CM

## 2018-03-14 DIAGNOSIS — Z823 Family history of stroke: Secondary | ICD-10-CM

## 2018-03-14 DIAGNOSIS — Z6838 Body mass index (BMI) 38.0-38.9, adult: Secondary | ICD-10-CM

## 2018-03-14 NOTE — Addendum Note (Signed)
Addended by: Carmelina Noun on: 03/14/2018 11:42 AM   Modules accepted: Orders

## 2018-03-14 NOTE — ED Triage Notes (Signed)
Pt reports SOB and episodes of passing out over the last week. He has been seen by his PCP who said that he has fluid on his lungs and started him on a Zpack and adjusted his lasix. He has continued to get progressively more winded with activity and family states that he is orthostatic. Pt states that he is fatigued.

## 2018-03-15 ENCOUNTER — Emergency Department (HOSPITAL_COMMUNITY): Payer: Medicare HMO

## 2018-03-15 ENCOUNTER — Encounter (HOSPITAL_COMMUNITY): Payer: Self-pay

## 2018-03-15 ENCOUNTER — Observation Stay (HOSPITAL_BASED_OUTPATIENT_CLINIC_OR_DEPARTMENT_OTHER): Payer: Medicare HMO

## 2018-03-15 DIAGNOSIS — R0602 Shortness of breath: Secondary | ICD-10-CM

## 2018-03-15 DIAGNOSIS — R0609 Other forms of dyspnea: Secondary | ICD-10-CM | POA: Diagnosis not present

## 2018-03-15 DIAGNOSIS — I5033 Acute on chronic diastolic (congestive) heart failure: Secondary | ICD-10-CM | POA: Diagnosis not present

## 2018-03-15 DIAGNOSIS — J810 Acute pulmonary edema: Secondary | ICD-10-CM | POA: Diagnosis not present

## 2018-03-15 DIAGNOSIS — R350 Frequency of micturition: Secondary | ICD-10-CM

## 2018-03-15 DIAGNOSIS — J9601 Acute respiratory failure with hypoxia: Secondary | ICD-10-CM

## 2018-03-15 DIAGNOSIS — I10 Essential (primary) hypertension: Secondary | ICD-10-CM

## 2018-03-15 DIAGNOSIS — I2510 Atherosclerotic heart disease of native coronary artery without angina pectoris: Secondary | ICD-10-CM | POA: Diagnosis not present

## 2018-03-15 DIAGNOSIS — I5031 Acute diastolic (congestive) heart failure: Secondary | ICD-10-CM | POA: Diagnosis not present

## 2018-03-15 DIAGNOSIS — E7800 Pure hypercholesterolemia, unspecified: Secondary | ICD-10-CM | POA: Diagnosis not present

## 2018-03-15 DIAGNOSIS — R0600 Dyspnea, unspecified: Secondary | ICD-10-CM | POA: Diagnosis present

## 2018-03-15 DIAGNOSIS — N401 Enlarged prostate with lower urinary tract symptoms: Secondary | ICD-10-CM

## 2018-03-15 DIAGNOSIS — I2583 Coronary atherosclerosis due to lipid rich plaque: Secondary | ICD-10-CM | POA: Diagnosis not present

## 2018-03-15 DIAGNOSIS — I509 Heart failure, unspecified: Secondary | ICD-10-CM | POA: Diagnosis not present

## 2018-03-15 LAB — CBC WITH DIFFERENTIAL/PLATELET
Abs Immature Granulocytes: 0.08 10*3/uL — ABNORMAL HIGH (ref 0.00–0.07)
Basophils Absolute: 0.1 10*3/uL (ref 0.0–0.1)
Basophils Relative: 1 %
Eosinophils Absolute: 0.3 10*3/uL (ref 0.0–0.5)
Eosinophils Relative: 3 %
HCT: 42.1 % (ref 39.0–52.0)
Hemoglobin: 13.4 g/dL (ref 13.0–17.0)
Immature Granulocytes: 1 %
Lymphocytes Relative: 32 %
Lymphs Abs: 3.1 10*3/uL (ref 0.7–4.0)
MCH: 29.3 pg (ref 26.0–34.0)
MCHC: 31.8 g/dL (ref 30.0–36.0)
MCV: 92.1 fL (ref 80.0–100.0)
Monocytes Absolute: 1.2 10*3/uL — ABNORMAL HIGH (ref 0.1–1.0)
Monocytes Relative: 12 %
Neutro Abs: 5.1 10*3/uL (ref 1.7–7.7)
Neutrophils Relative %: 51 %
Platelets: 256 10*3/uL (ref 150–400)
RBC: 4.57 MIL/uL (ref 4.22–5.81)
RDW: 13.4 % (ref 11.5–15.5)
WBC: 9.8 10*3/uL (ref 4.0–10.5)
nRBC: 0 % (ref 0.0–0.2)

## 2018-03-15 LAB — BRAIN NATRIURETIC PEPTIDE: B Natriuretic Peptide: 76.2 pg/mL (ref 0.0–100.0)

## 2018-03-15 LAB — I-STAT TROPONIN, ED: Troponin i, poc: 0.14 ng/mL (ref 0.00–0.08)

## 2018-03-15 LAB — COMPREHENSIVE METABOLIC PANEL
ALT: 20 U/L (ref 0–44)
AST: 26 U/L (ref 15–41)
Albumin: 3.3 g/dL — ABNORMAL LOW (ref 3.5–5.0)
Alkaline Phosphatase: 61 U/L (ref 38–126)
Anion gap: 7 (ref 5–15)
BUN: 18 mg/dL (ref 8–23)
CO2: 25 mmol/L (ref 22–32)
Calcium: 8.8 mg/dL — ABNORMAL LOW (ref 8.9–10.3)
Chloride: 107 mmol/L (ref 98–111)
Creatinine, Ser: 0.97 mg/dL (ref 0.61–1.24)
GFR calc Af Amer: >60 mL/min (ref >60)
GFR calc non Af Amer: >60 mL/min (ref >60)
Glucose, Bld: 123 mg/dL — ABNORMAL HIGH (ref 70–99)
Potassium: 4 mmol/L (ref 3.5–5.1)
Sodium: 139 mmol/L (ref 135–145)
Total Bilirubin: 0.6 mg/dL (ref 0.3–1.2)
Total Protein: 7.3 g/dL (ref 6.5–8.1)

## 2018-03-15 LAB — TROPONIN I
Troponin I: 0.1 ng/mL (ref <0.03)
Troponin I: 0.09 ng/mL (ref <0.03)
Troponin I: 0.07 ng/mL (ref <0.03)

## 2018-03-15 LAB — ECHOCARDIOGRAM COMPLETE

## 2018-03-15 MED ORDER — METOPROLOL TARTRATE 50 MG PO TABS: 50.0000 mg | ORAL_TABLET | Freq: Every day | ORAL | Status: DC

## 2018-03-15 MED ORDER — METOPROLOL TARTRATE 25 MG PO TABS
25.0000 mg | ORAL_TABLET | Freq: Every day | ORAL | Status: DC
  Administered 2018-03-15: 25 mg via ORAL
  Filled 2018-03-15: qty 1

## 2018-03-15 MED ORDER — SODIUM CHLORIDE (PF) 0.9 % IJ SOLN
INTRAMUSCULAR | Status: AC
  Filled 2018-03-15: qty 50

## 2018-03-15 MED ORDER — ACETAMINOPHEN 325 MG PO TABS
650.0000 mg | ORAL_TABLET | Freq: Four times a day (QID) | ORAL | Status: DC | PRN
  Administered 2018-03-15 – 2018-03-17 (×3): 650 mg via ORAL
  Filled 2018-03-15 (×3): qty 2

## 2018-03-15 MED ORDER — IOPAMIDOL (ISOVUE-370) INJECTION 76%
100.0000 mL | Freq: Once | INTRAVENOUS | Status: AC | PRN
  Administered 2018-03-15: 100 mL via INTRAVENOUS

## 2018-03-15 MED ORDER — ASPIRIN EC 81 MG PO TBEC
162.0000 mg | DELAYED_RELEASE_TABLET | Freq: Every day | ORAL | Status: DC
  Administered 2018-03-15: 162 mg via ORAL
  Filled 2018-03-15: qty 2

## 2018-03-15 MED ORDER — SERTRALINE HCL 50 MG PO TABS
50.0000 mg | ORAL_TABLET | Freq: Every day | ORAL | Status: DC
  Administered 2018-03-15 – 2018-03-18 (×4): 50 mg via ORAL
  Filled 2018-03-15 (×4): qty 1

## 2018-03-15 MED ORDER — ENOXAPARIN SODIUM 60 MG/0.6ML ~~LOC~~ SOLN
60.0000 mg | SUBCUTANEOUS | Status: DC
  Administered 2018-03-15 – 2018-03-18 (×4): 60 mg via SUBCUTANEOUS
  Filled 2018-03-15 (×5): qty 0.6

## 2018-03-15 MED ORDER — IOPAMIDOL (ISOVUE-370) INJECTION 76%
INTRAVENOUS | Status: AC
  Filled 2018-03-15: qty 100

## 2018-03-15 MED ORDER — PRAVASTATIN SODIUM 40 MG PO TABS
40.0000 mg | ORAL_TABLET | Freq: Every day | ORAL | Status: DC
  Administered 2018-03-15 – 2018-03-17 (×3): 40 mg via ORAL
  Filled 2018-03-15 (×3): qty 1

## 2018-03-15 MED ORDER — PERFLUTREN LIPID MICROSPHERE
1.0000 mL | INTRAVENOUS | Status: AC | PRN
  Administered 2018-03-15: 3 mL via INTRAVENOUS

## 2018-03-15 MED ORDER — FUROSEMIDE 10 MG/ML IJ SOLN
40.0000 mg | Freq: Two times a day (BID) | INTRAMUSCULAR | Status: DC
  Administered 2018-03-15 – 2018-03-18 (×7): 40 mg via INTRAVENOUS
  Filled 2018-03-15 (×7): qty 4

## 2018-03-15 MED ORDER — LOSARTAN POTASSIUM 25 MG PO TABS: 25.0000 mg | ORAL_TABLET | Freq: Every day | ORAL | Status: DC

## 2018-03-15 MED ORDER — IPRATROPIUM-ALBUTEROL 0.5-2.5 (3) MG/3ML IN SOLN
3.0000 mL | Freq: Once | RESPIRATORY_TRACT | Status: AC
Start: 2018-03-15 — End: 2018-03-15
  Administered 2018-03-15: 3 mL via RESPIRATORY_TRACT
  Filled 2018-03-15: qty 3

## 2018-03-15 NOTE — ED Notes (Signed)
Patient transported to CT 

## 2018-03-15 NOTE — ED Notes (Signed)
Patient transported to x-ray. ?

## 2018-03-15 NOTE — Consult Note (Addendum)
Admit date: 03/14/2018 Referring Physician: Marzetta Board, MD Primary Physician: Sallee Lange, MD Primary Cardiologist:  Quay Burow, MD Reason for Consultation:  DOE  HPI: [name] is a 78 y.o. male who is being seen today for the evaluation of DOE at the request of Marzetta Board, MD.  Is a very pleasant 78 year old male with a history of obesity, CAD with remote PCI stenting of the LAD in 1996 in the setting of anterior wall MI hypertension and hyperlipidemia.  His last myocardial perfusion scan showed no ischemia in 2011.  He was just seen in the audiology office by Dr. Gwenlyn Found 2 days ago with complaints of shortness of breath as well as intermittent chest pain and plan was to get a 2D echo and pharmacologic Myoview stress test.  Apparently he has been less mobile over the past few weeks due to shortness of breath and can barely walk in the house.  He is also been complaining of intermittent chest pressure when he gets out of breath.  He is complained of lower extremity edema as well as abdominal distention and has gained 30 pounds in the past year.  His family says that he is eating a lot of processed foods and lives by himself and is not compliant with a low-sodium diet.  Arrival it was a long ER BNP was normal at 76.2 and troponin minimally elevated at 0.14.  EKG showed sinus rhythm with T wave inversions V4 and V5 which are old.  Cardiology is now asked to consult for further evaluation    PMH:   Past Medical History:  Diagnosis Date  . Asthma   . BPH (benign prostatic hyperplasia)   . CAD (coronary artery disease) 01/1995  . Clostridium difficile colitis 04/2005  . Colitis 2011  . COPD (chronic obstructive pulmonary disease) ([place])   . Depression   . Diverticulosis   . DJD (degenerative joint disease)   . Gastric ulcer 04/17/10   Three 36m gastric ulcers, H.pylori serologies were negative  . GERD (gastroesophageal reflux disease)   . History of kidney stones   .  Hyperlipidemia   . Hypertension   . Idiopathic chronic inflammatory bowel disease 05/18/2010   left-sided UC  . Kidney stone   . [place]bid [place] 03/12/2018  . Obstructive sleep apnea   . Reflux 02/1995  . S/P endoscopy 07/24/10   retained gastric contents, benign bx     PSH:   Past Surgical History:  Procedure Laterality Date  . ANORECTAL MANOMETRY  2016   [facility]: concern for possible fissure. Noted pelvic floor dyssnyergy  . BIOPSY  07/29/2017   Procedure: BIOPSY;  Surgeon: RDaneil Dolin MD;  Location: [facility];  Service: Endoscopy;;  ascending and sigmoid colon  . CARDIAC CATHETERIZATION     with stent  . CARDIOVASCULAR STRESS TEST  07/21/2009   No scintigraphic evidence of inducible myocardial ischemia  . CARPAL TUNNEL RELEASE Left 01/17/2017   Procedure: LEFT CARPAL TUNNEL RELEASE;  Surgeon: HCarole Civil MD;  Location: [facility];  Service: Orthopedics;  Laterality: Left;  . CATARACT EXTRACTION, BILATERAL Bilateral   . CERVICAL SPINE SURGERY     C4-5  . COLONOSCOPY  04/2005   granularity and friability erosions from rectum to 40cm. Bx infection vs IBD. C. Diff positive at the time.   . COLONOSCOPY  05/2010   [facility]: left-sided UC, bx with no dysplasia, shallow diverticula  . COLONOSCOPY [name] 11/07/2012   RXQJ:JHER-DEYCXproctocolitis status post segmental biopsy/Sigmoid  colon polyps removed as described above. Procedure compromisd by technical difficulties. bx: Inflammation limited to sigmoid and rectum on pathology.  . COLONOSCOPY  04/2014   Dr. Nyoka Cowden at [facility]: well localized proctocolitis limited to sigmoid  . COLONOSCOPY WITH PROPOFOL [name] 07/29/2017   diverticulosis in colon, three 4-6 mm polyps at splenic flexure and in cecum, one 10 mm polyp in rectum, abnormal rectum and sigmoid consistent with active UC  . CORONARY STENT PLACEMENT  01/1995  . ESOPHAGOGASTRODUODENOSCOPY  07/24/2010   GHW:EXHBZJ esophagus  . ESOPHAGOGASTRODUODENOSCOPY  04/2014   Dr. Nyoka Cowden at  [facility]: negative small bowel biopsies  . FOOT SURGERY Bilateral two  . KNEE SURGERY  two  . POLYPECTOMY  07/29/2017   Procedure: POLYPECTOMY;  Surgeon: Daneil Dolin, MD;  Location: [facility];  Service: Endoscopy;;  splenic flexure, ascending colon polyp;rectal  . SHOULDER SURGERY  two  . TONSILLECTOMY    . TRANSTHORACIC ECHOCARDIOGRAM  03/23/2009   EF 60-65%, normal LV systolic function  . ULNAR HEAD EXCISION Left 01/17/2017   Procedure: LEFT ULNAR HEAD RESECTION;  Surgeon: Carole Civil, MD;  Location: [facility];  Service: Orthopedics;  Laterality: Left;    Allergies:  Patient has no known allergies. Prior to Admit Meds:   Medications Prior to Admission  Medication Sig Dispense Refill Last Dose  . acetaminophen (TYLENOL) 325 MG tablet Take 650 mg by mouth every 6 (six) hours as needed for moderate pain or headache.   unknown  . amLODipine (NORVASC) 2.5 MG tablet Take 1 tablet (2.5 mg total) by mouth daily. 30 tablet 5 03/14/2018 at Unknown time  . aspirin 81 MG tablet Take 162 mg by mouth at bedtime.    03/14/2018 at Unknown time  . azithromycin (ZITHROMAX Z-PAK) 250 MG tablet Take 2 tablets (500 mg) on  Day 1,  followed by 1 tablet (250 mg) once daily on Days 2 through 5. 6 each 0 03/12/2018  . doxazosin (CARDURA) 4 MG tablet Take 1 tablet (4 mg total) by mouth at bedtime. (Patient taking differently: Take 4 mg by mouth at bedtime. HOLD HOLD HOLD 03/12/2018 ov  Pt instructed not to take until he meets with his PCP sometime on the week of 03-16-18) 30 tablet 5 Past Week at Unknown time  . furosemide (LASIX) 20 MG tablet One q am prn pedal edema (Patient taking differently: Take 20 mg by mouth See admin instructions. Take 20 mg as needed each morning for edema) 30 tablet 3 03/15/2018 at Unknown time  . losartan (COZAAR) 25 MG tablet TAKE 1 TABLET BY MOUTH ONCE DAILY (Patient taking differently: Take 25 mg by mouth daily. ) 90 tablet 1 03/14/2018 at Unknown time  . metoprolol tartrate  (LOPRESSOR) 50 MG tablet One po QHS (Patient taking differently: Take 50 mg by mouth at bedtime. One po QHS) 30 tablet 5 03/14/2018 at Unknown time  . nitroGLYCERIN (NITROSTAT) 0.4 MG SL tablet Place 0.4 mg under the tongue every 5 (five) minutes as needed for chest pain.   unknown  . OVER THE COUNTER MEDICATION Omega XL 4 tablets at night   Past Week at Unknown time  . pravastatin (PRAVACHOL) 40 MG tablet Take 1 tablet (40 mg total) by mouth at bedtime. 90 tablet 0 03/14/2018 at Unknown time  . sertraline (ZOLOFT) 50 MG tablet Take 1 tablet (50 mg total) by mouth daily. 30 tablet 5    Fam HX:    Family History  Problem Relation Age of Onset  . Lung  cancer Mother   . Cancer Mother        breast  . Diabetes Mother   . Stroke Father   . Hypertension Father   . Kidney failure Brother   . Other Child        blood infection  . Colon cancer Neg Hx    Social HX:    Social History   Socioeconomic History  . Marital status: Widowed    Spouse name: Not on file  . Number of children: Not on file  . Years of education: Not on file  . Highest education level: Not on file  Occupational History  . 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 Financial resource strain: Not on file  . Food insecurity:    Worry: Not on file    Inability: Not on file  . Transportation needs:    Medical: Not on file    Non-medical: Not on file  Tobacco Use  . Smoking status: Former Smoker    Packs/day: 1.00    Years: 20.00    Pack years: 20.00    Types: Cigarettes    Last attempt to quit: 09/30/1969    Years since quitting: 48.4  . Smokeless tobacco: Never Used  Substance and Sexual Activity  . Alcohol use: No  . Drug use: No  . Sexual activity: Yes    Partners: Female    Birth control/protection: None    Comment: spouse  Lifestyle  . Physical activity:    Days per week: Not on file    Minutes per session: Not on file  . Stress: Not on file  Relationships  . Social connections:     Talks on phone: Not on file    Gets together: Not on file    Attends religious service: Not on file    Active member of club or organization: Not on file    Attends meetings of clubs or organizations: Not on file    Relationship status: Not on file  . Intimate partner violence:    Fear of current or ex partner: Not on file    Emotionally abused: Not on file    Physically abused: Not on file    Forced sexual activity: Not on file  Other Topics Concern  . Not on file  Social History Narrative  . Not on file     ROS:  All  ROS were addressed and are negative except what is stated in the HPI  Physical Exam: Blood pressure (!) 171/62, pulse 70, temperature 98.2 F (36.8 C), temperature source Oral, resp. rate 20, SpO2 96 %.    General: Well developed, well nourished, in no acute distress Head: Eyes PERRLA, No xanthomas.   Normal cephalic and atramatic  Lungs:   Clear bilaterally to auscultation and percussion. Heart:   HRRR S1 S2 Pulses are 2+ & equal.            No carotid bruit. No JVD.  No abdominal bruits. No femoral bruits. Abdomen: Bowel sounds are positive, abdomen soft and non-tender without masses or                  Hernia's noted. Msk:  Back normal, normal gait. Normal strength and tone for age. Extremities:   No clubbing, cyanosis.  There is 2+ pitting edema of LE  DP +1 Neuro: Alert and oriented X 3. Psych:  Good affect, responds appropriately    Labs:   Lab Results  Component  Value Date   WBC 9.8 03/15/2018   HGB 13.4 03/15/2018   HCT 42.1 03/15/2018   MCV 92.1 03/15/2018   PLT 256 03/15/2018    Recent Labs  Lab 03/15/18 0055  NA 139  K 4.0  CL 107  CO2 25  BUN 18  CREATININE 0.97  CALCIUM 8.8*  PROT 7.3  BILITOT 0.6  ALKPHOS 61  ALT 20  AST 26  GLUCOSE 123*   No results found for: PTT Lab Results  Component Value Date   INR 1.06 12/31/2012   INR 1.00 10/19/2009   INR 1.02 08/12/2009   Lab Results  Component Value Date   CKTOTAL 94  04/13/2010   CKMB 0.9 04/13/2010   TROPONINI <0.30 12/14/2012     Lab Results  Component Value Date   CHOL 130 03/12/2018   CHOL 171 08/07/2016   CHOL 159 06/08/2015   Lab Results  Component Value Date   HDL 45 03/12/2018   HDL 49 08/07/2016   HDL 50 06/08/2015   Lab Results  Component Value Date   LDLCALC 49 03/12/2018   LDLCALC 76 08/07/2016   LDLCALC 76 06/08/2015   Lab Results  Component Value Date   TRIG 178 (H) 03/12/2018   TRIG 230 (H) 08/07/2016   TRIG 166 (H) 06/08/2015   Lab Results  Component Value Date   CHOLHDL 2.9 03/12/2018   CHOLHDL 3.5 08/07/2016   CHOLHDL 3.2 06/08/2015   No results found for: LDLDIRECT    Radiology:  Dg Chest 2 View  Result Date: 03/15/2018 CLINICAL DATA:  Shortness of breath EXAM: CHEST - 2 VIEW COMPARISON:  03/12/2018 FINDINGS: Cardiomegaly. No confluent airspace opacities or effusions. No overt edema. No acute bony abnormality. IMPRESSION: Cardiomegaly.  No active disease. Electronically Signed   By: Rolm Baptise M.D.   On: 03/15/2018 00:35   Ct Angio Chest Pe W And/or Wo Contrast  Result Date: 03/15/2018 CLINICAL DATA:  Shortness of breath and syncopal episodes over the past week. EXAM: CT ANGIOGRAPHY CHEST WITH CONTRAST TECHNIQUE: Multidetector CT imaging of the chest was performed using the standard protocol during bolus administration of intravenous contrast. Multiplanar CT image reconstructions and MIPs were obtained to evaluate the vascular anatomy. CONTRAST:  122m ISOVUE-370 IOPAMIDOL (ISOVUE-370) INJECTION 76% COMPARISON:  Chest CT from 2015. FINDINGS: Cardiovascular: The heart is enlarged but stable when compared to recent chest x-rays. Progressive since compared to 2015 chest CT. No pericardial effusion. The aorta is normal in caliber. No dissection. Mild-to-moderate atherosclerotic calcifications. There are fairly extensive three-vessel coronary artery calcifications. The pulmonary arterial tree is suboptimally opacified.  Moderate artifact related to breathing. No large central pulmonary emboli are identified. Mediastinum/Nodes: Small scattered mediastinal and hilar lymph nodes but no mass or overt adenopathy. The esophagus is grossly normal. Lungs/Pleura: Significant upper lobe predominant emphysematous changes with superimposed interstitial and airspace process, likely pulmonary edema. Very small pleural effusions are noted along with bibasilar atelectasis. No focal airspace consolidation to suggest pneumonia. No worrisome pulmonary lesions. Upper Abdomen: No significant upper abdominal findings. Musculoskeletal: No significant bony findings. Possible changes of ankylosing spondylitis involving the thoracic spine. Review of the MIP images confirms the above findings. IMPRESSION: 1. Limited examination but no large central pulmonary emboli. 2. No aortic aneurysm or dissection. 3. Significant three-vessel coronary artery calcifications. 4. Cardiac enlargement. 5. Emphysematous changes with superimposed pulmonary edema. There also small effusions and bibasilar atelectasis. 6. No worrisome pulmonary lesions. Aortic Atherosclerosis (ICD10-I70.0) and Emphysema (ICD10-J43.9). Electronically Signed   By: PMamie Nick  Gallerani M.D.   On: 03/15/2018 04:37     Telemetry    Sinus rhythm- Personally Reviewed  ECG    Sinus rhythm at 73 bpm with T wave inversions in V5 4 and V5 are old- Personally Reviewed   ASSESSMENT/PLAN:   1.  Dyspnea on exertion  -Etiology unclear -His last 2D echo was done in 2011 showing normal LV function with EF 60 to 65% and mild MR -2D echo pending this admission -BNP is normal but this may be falsely low in the setting of obesity -Troponin is minimally elevated at 0.14 and was a point-of-care. -We will cycle normal lab troponins -Await echo results -Further ischemic work-up pending results of troponins and echo.  If troponins show a flat trend and echo shows no wall motion abnormalities and pursue  pharmacologic Myoview otherwise will need cath if troponins continue to trend upward and/or LV function is abnormal and echo -Decrease aspirin to 81 mg daily to avoid GI side effects -Continue full dose Lovenox beta-blocker and statin.  2.  ASCAD -Status post remote anterior wall MI with PCI of the LAD in 296 with no ischemia on Myoview in 2011 -Continue aspirin, statin and beta-blocker  3.  Hyperlipidemia -LDL goal is<70 -LDL was 49 on 03/12/2018 -Continue simvastatin  4.  COPD -Chest x-ray showed no active disease  5.  Acute CHF -LVF has been normal in the past -he has significant LE edema but cxray and BNP are normal -BNP may be falsely low in setting of obesity  -currently on Lasix 70m IV BID -follow I&Os and daily weights     TFransico Him MD  03/15/2018  9:18 AM

## 2018-03-15 NOTE — ED Notes (Signed)
I gave critical I Stat troponin results to MD Delo

## 2018-03-15 NOTE — H&P (Signed)
History and Physical    [name] MWN:[id number] DOB: May 02, 1940 DOA: 03/14/2018  I have briefly reviewed the patient's prior medical records in [place]  PCP: [name], MD  Patient coming from: home  Chief Complaint: Progressive dyspnea on exertion over the last month  HPI: [name] is a 78 y.o. male with medical history significant of obesity, coronary artery disease with prior MI and PCI, hypertension, hyperlipidemia, who presents to the hospital with chief complaint of progressive dyspnea on exertion over the last month.  He was evaluated by cardiology, Dr. Alvester Chou as an outpatient 2 days ago with plans for 2D echo.  2 daughters are at bedside, and they tell me that he has been less mobile over the last several weeks, barely able to take a few steps before getting out of breath.  They tell me that he has been intermittent complaining of chest pressure when he goes out of breath but patient denies that to me and tells me it is the breathing that bothers him the most.  He is also been complaining of slight abdominal distention and lower extremity swelling.  He states that he has gained 30 pounds in the last year.  Family reportedly tells me that he is eating a lot of processed foods, lives by himself and does not always make the right choices.  He denies any fever or chills, he denies any nausea or vomiting.  He is also complaining of difficulty urinating, having frequent accidents and going "too often".  They also mention that he has been having "colitis" for the past 5 years and has frequent loose bowel movements throughout the day for which he needs to take antidiarrheal medications daily.  He has been afraid to go to get out of his home due to this.  ED Course: In the emergency room his vitals are stable, he is afebrile, his BMP/CBC are essentially unremarkable.  His BNP is normal at 76.2, initial troponin slightly elevated 0.14.  EKG shows sinus rhythm with TWI in  V4-5 which are not new  Review of Systems: As per HPI otherwise 10 point review of systems negative.   Past Medical History:  Diagnosis Date  . Asthma   . BPH (benign prostatic hyperplasia)   . CAD (coronary artery disease) 01/1995  . Clostridium difficile colitis 04/2005  . Colitis 2011  . COPD (chronic obstructive pulmonary disease) ([place])   . Depression   . Diverticulosis   . DJD (degenerative joint disease)   . Gastric ulcer 04/17/10   Three 18m gastric ulcers, H.pylori serologies were negative  . GERD (gastroesophageal reflux disease)   . History of kidney stones   . Hyperlipidemia   . Hypertension   . Idiopathic chronic inflammatory bowel disease 05/18/2010   left-sided UC  . Kidney stone   . Morbid obesity ([place] 03/12/2018  . Obstructive sleep apnea   . Reflux 02/1995  . S/P endoscopy 07/24/10   retained gastric contents, benign bx    Past Surgical History:  Procedure Laterality Date  . ANORECTAL MANOMETRY  2016   [facility]: concern for possible fissure. Noted pelvic floor dyssnyergy  . BIOPSY  07/29/2017   Procedure: BIOPSY;  Surgeon: RDaneil Dolin MD;  Location: [facility];  Service: Endoscopy;;  ascending and sigmoid colon  . CARDIAC CATHETERIZATION     with stent  . CARDIOVASCULAR STRESS TEST  07/21/2009   No scintigraphic evidence of inducible myocardial ischemia  . CARPAL TUNNEL RELEASE Left 01/17/2017  Procedure: LEFT CARPAL TUNNEL RELEASE;  Surgeon: Carole Civil, MD;  Location: [facility] ORS;  Service: Orthopedics;  Laterality: Left;  . CATARACT EXTRACTION, BILATERAL Bilateral   . CERVICAL SPINE SURGERY     C4-5  . COLONOSCOPY  04/2005   granularity and friability erosions from rectum to 40cm. Bx infection vs IBD. C. Diff positive at the time.   . COLONOSCOPY  05/2010   [facility]: left-sided UC, bx with no dysplasia, shallow diverticula  . COLONOSCOPY [name] 11/07/2012   [facility]:ERDE-YCXKG proctocolitis status post segmental biopsy/Sigmoid colon polyps removed as  described above. Procedure compromisd by technical difficulties. bx: Inflammation limited to sigmoid and rectum on pathology.  . COLONOSCOPY  04/2014   [facility] Nyoka Cowden at [facility]: well localized proctocolitis limited to sigmoid  . COLONOSCOPY WITH PROPOFOL [name] 07/29/2017   diverticulosis in colon, three 4-6 mm polyps at splenic flexure and in cecum, one 10 mm polyp in rectum, abnormal rectum and sigmoid consistent with active UC  . CORONARY STENT PLACEMENT  01/1995  . ESOPHAGOGASTRODUODENOSCOPY  07/24/2010   YJE:HUDJSH esophagus  . ESOPHAGOGASTRODUODENOSCOPY  04/2014   Dr[facility]den at [facility]: negative small bowel biopsies  . FOOT SURGERY Bilateral two  . KNEE SURGERY  two  . POLYPECTOMY  07/29/2017   Procedure: POLYPECTOMY;  Surgeon: Daneil Dolin, MD;  Location[facility];  Service: Endoscopy;;  splenic flexure, ascending colon polyp;rectal  . SHOULDER SURGERY  two  . TONSILLECTOMY    . TRANSTHORACIC ECHOCARDIOGRAM  03/23/2009   EF 60-65%, normal LV systolic function  . ULNAR HEAD EXCISION Left 01/17/2017   Procedure: LEFT ULNAR HEAD RESECTION;  Surgeon: Carole Civil, MD;  Location: [facility];  Service: Orthopedics;  Laterality: Left;     reports that he quit smoking about 48 years ago. His smoking use included cigarettes. He has a 20.00 pack-year smoking history. He has never used smokeless tobacco. He reports that he does not drink alcohol or use drugs.  No Known Allergies  Family History  Problem Relation Age of Onset  . Lung cancer Mother   . Cancer Mother        breast  . Diabetes Mother   . Stroke Father   . Hypertension Father   . Kidney failure Brother   . Other Child        blood infection  . Colon cancer Neg Hx     Prior to Admission medications   Medication Sig Start Date End Date Taking? Authorizing Provider  acetaminophen (TYLENOL) 325 MG tablet Take 650 mg by mouth every 6 (six) hours as needed for moderate pain or headache.   Yes [provider]    amLODipine (NORVASC) 2.5 MG tablet Take 1 tablet (2.5 mg total) by mouth daily. 03/12/18  Yes [name], MD  aspirin 81 MG tablet Take 162 mg by mouth at bedtime.    Yes [provider]  azithromycin (ZITHROMAX Z-PAK) 250 MG tablet Take 2 tablets (500 mg) on  Day 1,  followed by 1 tablet (250 mg) once daily on Days 2 through 5. 03/12/18  Yes Luking, Elayne Snare, MD  doxazosin (CARDURA) 4 MG tablet Take 1 tablet (4 mg total) by mouth at bedtime. Patient taking differently: Take 4 mg by mouth at bedtime. HOLD HOLD HOLD 03/12/2018 ov  Pt instructed not to take until he meets with his PCP sometime on the week of 03-16-18 11/05/17  Yes Luking, Scott A, MD  furosemide (LASIX) 20 MG tablet One q am prn  pedal edema Patient taking differently: Take 20 mg by mouth See admin instructions. Take 20 mg as needed each morning for edema 11/05/17  Yes Luking, Scott A, MD  losartan (COZAAR) 25 MG tablet TAKE 1 TABLET BY MOUTH ONCE DAILY Patient taking differently: Take 25 mg by mouth daily.  01/23/18  Yes [name], MD  metoprolol tartrate (LOPRESSOR) 50 MG tablet One po QHS Patient taking differently: Take 50 mg by mouth at bedtime. One po QHS 03/12/18  Yes Luking, Scott A, MD  nitroGLYCERIN (NITROSTAT) 0.4 MG SL tablet Place 0.4 mg under the tongue every 5 (five) minutes as needed for chest pain.   Yes [provider]  OVER THE COUNTER MEDICATION Omega XL 4 tablets at night   Yes [provider]  pravastatin (PRAVACHOL) 40 MG tablet Take 1 tablet (40 mg total) by mouth at bedtime. 02/27/18  Yes [name], MD  sertraline (ZOLOFT) 50 MG tablet Take 1 tablet (50 mg total) by mouth daily. 03/12/18   [name], MD    Physical Exam: Vitals:   03/15/18 0130 03/15/18 0317 03/15/18 0430 03/15/18 0645  BP: 139/65 (!) 142/60 (!) 143/75 (!) 171/62  Pulse: 61 69 70 70  Resp: 16 (!) 24 18 20   Temp:    98.2 F (36.8 C)  TempSrc:    Oral  SpO2: 94% 93% 94% 96%       Constitutional: NAD, calm, comfortable Eyes: PERRL, lids and conjunctivae normal ENMT: Mucous membranes are moist. Respiratory: mostly clear to auscultation bilaterally, no wheezing, faint bibasilar crackles. Normal respiratory effort. No accessory muscle use.  Cardiovascular: Regular rate and rhythm, no murmurs / rubs / gallops. 1-2+ extremity edema. 2+ pedal pulses.  Abdomen: no tenderness, no masses palpated. Bowel sounds positive.  Musculoskeletal: no clubbing / cyanosis. Normal muscle tone.  Skin: no rashes Neurologic: CN 2-12 grossly intact. Strength 5/5 in all 4.  Psychiatric: Normal judgment and insight. Alert and oriented x 3. Normal mood.   Labs on Admission: I have personally reviewed following labs and imaging studies  CBC: Recent Labs  Lab 03/12/18 1452 03/15/18 0055  WBC 11.7* 9.8  NEUTROABS 6.5 5.1  HGB 14.3 13.4  HCT 42.8 42.1  MCV 88 92.1  PLT 247 308   Basic Metabolic Panel: Recent Labs  Lab 03/12/18 1452 03/15/18 0055  NA 140 139  K 4.3 4.0  CL 105 107  CO2 19* 25  GLUCOSE 92 123*  BUN 20 18  CREATININE 1.09 0.97  CALCIUM 9.2 8.8*   GFR: Estimated Creatinine Clearance: 89.7 mL/min (by C-G formula based on SCr of 0.97 mg/dL). Liver Function Tests: Recent Labs  Lab 03/12/18 1452 03/15/18 0055  AST 48* 26  ALT 20 20  ALKPHOS 69 61  BILITOT 0.4 0.6  PROT 6.8 7.3  ALBUMIN 3.4* 3.3*   No results for input(s): LIPASE, AMYLASE in the last 168 hours. No results for input(s): AMMONIA in the last 168 hours. Coagulation Profile: No results for input(s): INR, PROTIME in the last 168 hours. Cardiac Enzymes: No results for input(s): CKTOTAL, CKMB, CKMBINDEX, TROPONINI in the last 168 hours. BNP (last 3 results) No results for input(s): PROBNP in the last 8760 hours. HbA1C: Recent Labs    03/12/18 1452  HGBA1C 6.1*   CBG: No results for input(s): GLUCAP in the last 168 hours. Lipid Profile: Recent Labs    03/12/18 1452  CHOL 130   HDL 45  LDLCALC 49  TRIG 178*  CHOLHDL 2.9  Thyroid Function Tests: No results for input(s): TSH, T4TOTAL, FREET4, T3FREE, THYROIDAB in the last 72 hours. Anemia Panel: No results for input(s): VITAMINB12, FOLATE, FERRITIN, TIBC, IRON, RETICCTPCT in the last 72 hours. Urine analysis:    Component Value Date/Time   COLORURINE YELLOW 08/04/2014 1620   APPEARANCEUR CLEAR 08/04/2014 1620   LABSPEC 1.020 08/04/2014 1620   PHURINE 6.0 08/04/2014 1620   GLUCOSEU NEGATIVE 08/04/2014 1620   HGBUR SMALL (A) 08/04/2014 1620   BILIRUBINUR NEGATIVE 08/04/2014 1620   BILIRUBINUR ++ 11/18/2012 1319   KETONESUR NEGATIVE 08/04/2014 1620   PROTEINUR NEGATIVE 08/04/2014 1620   UROBILINOGEN 0.2 08/04/2014 1620   NITRITE NEGATIVE 08/04/2014 1620   LEUKOCYTESUR NEGATIVE 08/04/2014 1620     Radiological Exams on Admission: Dg Chest 2 View  Result Date: 03/15/2018 CLINICAL DATA:  Shortness of breath EXAM: CHEST - 2 VIEW COMPARISON:  03/12/2018 FINDINGS: Cardiomegaly. No confluent airspace opacities or effusions. No overt edema. No acute bony abnormality. IMPRESSION: Cardiomegaly.  No active disease. Electronically Signed   By: Rolm Baptise M.D.   On: 03/15/2018 00:35   Ct Angio Chest Pe W And/or Wo Contrast  Result Date: 03/15/2018 CLINICAL DATA:  Shortness of breath and syncopal episodes over the past week. EXAM: CT ANGIOGRAPHY CHEST WITH CONTRAST TECHNIQUE: Multidetector CT imaging of the chest was performed using the standard protocol during bolus administration of intravenous contrast. Multiplanar CT image reconstructions and MIPs were obtained to evaluate the vascular anatomy. CONTRAST:  175m ISOVUE-370 IOPAMIDOL (ISOVUE-370) INJECTION 76% COMPARISON:  Chest CT from 2015. FINDINGS: Cardiovascular: The heart is enlarged but stable when compared to recent chest x-rays. Progressive since compared to 2015 chest CT. No pericardial effusion. The aorta is normal in caliber. No dissection. Mild-to-moderate  atherosclerotic calcifications. There are fairly extensive three-vessel coronary artery calcifications. The pulmonary arterial tree is suboptimally opacified. Moderate artifact related to breathing. No large central pulmonary emboli are identified. Mediastinum/Nodes: Small scattered mediastinal and hilar lymph nodes but no mass or overt adenopathy. The esophagus is grossly normal. Lungs/Pleura: Significant upper lobe predominant emphysematous changes with superimposed interstitial and airspace process, likely pulmonary edema. Very small pleural effusions are noted along with bibasilar atelectasis. No focal airspace consolidation to suggest pneumonia. No worrisome pulmonary lesions. Upper Abdomen: No significant upper abdominal findings. Musculoskeletal: No significant bony findings. Possible changes of ankylosing spondylitis involving the thoracic spine. Review of the MIP images confirms the above findings. IMPRESSION: 1. Limited examination but no large central pulmonary emboli. 2. No aortic aneurysm or dissection. 3. Significant three-vessel coronary artery calcifications. 4. Cardiac enlargement. 5. Emphysematous changes with superimposed pulmonary edema. There also small effusions and bibasilar atelectasis. 6. No worrisome pulmonary lesions. Aortic Atherosclerosis (ICD10-I70.0) and Emphysema (ICD10-J43.9). Electronically Signed   By: PMarijo SanesM.D.   On: 03/15/2018 04:37    EKG: Independently reviewed. Sinus rhythm  Assessment/Plan Active Problems:   Exertional dyspnea   Dyspnea   Principal Problem Acute hypoxic respiratory failure due to pulmonary edema in the setting of acute on chronic CHF, unknown type -Place patient on IV Lasix, he has evidence of fluid overload with pulmonary edema on CT, crackles as well as lower extremity swelling, strict I's and O's, daily weights -He has been having intermittent chest tightness with activity, initial troponin slightly elevated 0.14, continue to cycle.   I have consulted cardiology given history of MI and prior stents  -Obtain a 2D echo  Active Problems Urinary frequency -Suspect a degree of BPH, will bladder scan and if needed place  a Foley catheter especially in the setting of diuresis  Addendum: scan with 25 mL after urinating.   Hypertension -Continue metoprolol, hold amlodipine until 2D echo is resulted, continue IV Lasix  Hyperlipidemia -Continue statin  Depression -Continue Zoloft  Reported "colitis" with loose stools -Discussed with RN to monitor today. This is chronic for 4-5 years   DVT prophylaxis: Lovenox Code Status: Full code Family Communication: 2 daughters at bedside Disposition Plan: To be determined, PT evaluation ordered Consults called: Cardiology   Marzetta Board, MD, PhD [facility]  Contact via [email or web address]  [facility] Office Info P: [phone]  F: [phone]   03/15/2018, 7:39 AM

## 2018-03-15 NOTE — Progress Notes (Signed)
CRITICAL VALUE ALERT  Critical Value:  Troponin 0.10  Date & Time Notied:  03/15/2018 1004  Provider Notified: Dr. Cruzita Lederer  Orders Received/Actions taken:

## 2018-03-15 NOTE — ED Notes (Signed)
ED TO INPATIENT HANDOFF REPORT  Name/Age/Gender [name] 78 y.o. male  Code Status Code Status History    Date Active Date Inactive Code Status Order ID Comments User Context   12/14/2012 0542 12/14/2012 1836 Full Code [id number]  Bonnielee Haff, MD Inpatient      Home/SNF/Other Home  Chief Complaint Short of breath  Level of Care/Admitting Diagnosis ED Disposition    ED Disposition Condition Comment   [place]: [facility] [[id number]]  Level of Care: Telemetry [5]  Admit to tele based on following criteria: Monitor for Ischemic changes  Diagnosis: Exertional dyspnea [517001]  Admitting Physician: Rise Patience [phone]  Attending Physician: Rise Patience [phone]  PT Class (Do Not Modify): Observation [104]  PT Acc Code (Do Not Modify): Observation [10022]       Medical History Past Medical History:  Diagnosis Date  . Asthma   . BPH (benign prostatic hyperplasia)   . CAD (coronary artery disease) 01/1995  . Clostridium difficile colitis 04/2005  . Colitis 2011  . COPD (chronic obstructive pulmonary disease) ([place])   . Depression   . Diverticulosis   . DJD (degenerative joint disease)   . Gastric ulcer 04/17/10   Three 79m gastric ulcers, H.pylori serologies were negative  . GERD (gastroesophageal reflux disease)   . History of kidney stones   . Hyperlipidemia   . Hypertension   . Idiopathic chronic inflammatory bowel disease 05/18/2010   left-sided UC  . Kidney stone   . Morbid obesity ([place] 03/12/2018  . Obstructive sleep apnea   . Reflux 02/1995  . S/P endoscopy 07/24/10   retained gastric contents, benign bx    Allergies No Known Allergies  IV Location/Drains/Wounds Patient Lines/Drains/Airways Status   Active Line/Drains/Airways    Name:   Placement date:   Placement time:   Site:   Days:   Peripheral IV 03/15/18 Right Wrist   03/15/18    0125    Wrist   less than 1   Peripheral IV 03/15/18 Right  Antecubital   03/15/18    0347    Antecubital   less than 1   Incision (Closed) 01/17/17 Arm Left   01/17/17    1121     422          Labs/Imaging Results for orders placed or performed during the hospital encounter of 03/14/18 (from the past 48 hour(s))  Comprehensive metabolic panel     Status: Abnormal   Collection Time: 03/15/18 12:55 AM  Result Value Ref Range   Sodium 139 135 - 145 mmol/L   Potassium 4.0 3.5 - 5.1 mmol/L   Chloride 107 98 - 111 mmol/L   CO2 25 22 - 32 mmol/L   Glucose, Bld 123 (H) 70 - 99 mg/dL   BUN 18 8 - 23 mg/dL   Creatinine, Ser 0.97 0.61 - 1.24 mg/dL   Calcium 8.8 (L) 8.9 - 10.3 mg/dL   Total Protein 7.3 6.5 - 8.1 g/dL   Albumin 3.3 (L) 3.5 - 5.0 g/dL   AST 26 15 - 41 U/L   ALT 20 0 - 44 U/L   Alkaline Phosphatase 61 38 - 126 U/L   Total Bilirubin 0.6 0.3 - 1.2 mg/dL   GFR calc non Af Amer >60 >60 mL/min   GFR calc Af Amer >60 >60 mL/min   Anion gap 7 5 - 15    Comment: [facility] [address], [place]  [address]  CBC with Differential     Status: Abnormal   Collection Time: 03/15/18 12:55 AM  Result Value Ref Range   WBC 9.8 4.0 - 10.5 K/uL   RBC 4.57 4.22 - 5.81 MIL/uL   Hemoglobin 13.4 13.0 - 17.0 g/dL   HCT 42.1 39.0 - 52.0 %   MCV 92.1 80.0 - 100.0 fL   MCH 29.3 26.0 - 34.0 pg   MCHC 31.8 30.0 - 36.0 g/dL   RDW 13.4 11.5 - 15.5 %   Platelets 256 150 - 400 K/uL   nRBC 0.0 0.0 - 0.2 %   Neutrophils Relative % 51 %   Neutro Abs 5.1 1.7 - 7.7 K/uL   Lymphocytes Relative 32 %   Lymphs Abs 3.1 0.7 - 4.0 K/uL   Monocytes Relative 12 %   Monocytes Absolute 1.2 (H) 0.1 - 1.0 K/uL   Eosinophils Relative 3 %   Eosinophils Absolute 0.3 0.0 - 0.5 K/uL   Basophils Relative 1 %   Basophils Absolute 0.1 0.0 - 0.1 K/uL   Immature Granulocytes 1 %   Abs Immature Granulocytes 0.08 (H) 0.00 - 0.07 K/uL    Comment: Performed at [facility], [place] [address], [place], [place] [address]   Brain natriuretic peptide     Status: None   Collection Time: 03/15/18 12:55 AM  Result Value Ref Range   B Natriuretic Peptide 76.2 0.0 - 100.0 pg/mL    Comment: Performed at [name], [place] [address], [place],  [address]  I-stat troponin, ED     Status: Abnormal   Collection Time: 03/15/18  1:28 AM  Result Value Ref Range   Troponin i, poc 0.14 (HH) 0.00 - 0.08 ng/mL   Comment NOTIFIED PHYSICIAN    Comment 3            Comment: Due to the release kinetics of cTnI, a negative result within the first hours of the onset of symptoms does not rule out myocardial infarction with certainty. If myocardial infarction is still suspected, repeat the test at appropriate intervals.    Dg Chest 2 View  Result Date: 03/15/2018 CLINICAL DATA:  Shortness of breath EXAM: CHEST - 2 VIEW COMPARISON:  03/12/2018 FINDINGS: Cardiomegaly. No confluent airspace opacities or effusions. No overt edema. No acute bony abnormality. IMPRESSION: Cardiomegaly.  No active disease. Electronically Signed   By: Rolm Baptise M.D.   On: 03/15/2018 00:35   Ct Angio Chest Pe W And/or Wo Contrast  Result Date: 03/15/2018 CLINICAL DATA:  Shortness of breath and syncopal episodes over the past week. EXAM: CT ANGIOGRAPHY CHEST WITH CONTRAST TECHNIQUE: Multidetector CT imaging of the chest was performed using the standard protocol during bolus administration of intravenous contrast. Multiplanar CT image reconstructions and MIPs were obtained to evaluate the vascular anatomy. CONTRAST:  161m ISOVUE-370 IOPAMIDOL (ISOVUE-370) INJECTION 76% COMPARISON:  Chest CT from 2015. FINDINGS: Cardiovascular: The heart is enlarged but stable when compared to recent chest x-rays. Progressive since compared to 2015 chest CT. No pericardial effusion. The aorta is normal in caliber. No dissection. Mild-to-moderate atherosclerotic calcifications. There are fairly extensive three-vessel coronary artery calcifications. The  pulmonary arterial tree is suboptimally opacified. Moderate artifact related to breathing. No large central pulmonary emboli are identified. Mediastinum/Nodes: Small scattered mediastinal and hilar lymph nodes but no mass or overt adenopathy. The esophagus is grossly normal. Lungs/Pleura: Significant upper lobe predominant emphysematous changes with superimposed interstitial and airspace process, likely pulmonary edema. Very small pleural effusions  are noted along with bibasilar atelectasis. No focal airspace consolidation to suggest pneumonia. No worrisome pulmonary lesions. Upper Abdomen: No significant upper abdominal findings. Musculoskeletal: No significant bony findings. Possible changes of ankylosing spondylitis involving the thoracic spine. Review of the MIP images confirms the above findings. IMPRESSION: 1. Limited examination but no large central pulmonary emboli. 2. No aortic aneurysm or dissection. 3. Significant three-vessel coronary artery calcifications. 4. Cardiac enlargement. 5. Emphysematous changes with superimposed pulmonary edema. There also small effusions and bibasilar atelectasis. 6. No worrisome pulmonary lesions. Aortic Atherosclerosis (ICD10-I70.0) and Emphysema (ICD10-J43.9). Electronically Signed   By: Marijo Sanes M.D.   On: 03/15/2018 04:37    Pending Labs Unresulted Labs (From admission, onward)   None      Vitals/Pain Today's Vitals   03/15/18 0002 03/15/18 0012 03/15/18 0130 03/15/18 0317  BP: (!) 155/64  139/65 (!) 142/60  Pulse: 67  61 69  Resp: (!) 28  16 (!) 24  Temp:      TempSrc:      SpO2: (!) 89% 93% 94% 93%  PainSc:        Isolation Precautions No active isolations  Medications Medications  sodium chloride (PF) 0.9 % injection (has no administration in time range)  iopamidol (ISOVUE-370) 76 % injection (has no administration in time range)  ipratropium-albuterol (DUONEB) 0.5-2.5 (3) MG/3ML nebulizer solution 3 mL (3 mLs Nebulization Given 03/15/18  0106)  iopamidol (ISOVUE-370) 76 % injection 100 mL (100 mLs Intravenous Contrast Given 03/15/18 0408)    Mobility walks with person assist

## 2018-03-15 NOTE — ED Provider Notes (Signed)
[facility] DEPT Provider Note   CSN: [id number] Arrival date & time: 03/14/18  2333     History   Chief Complaint Chief Complaint  Patient presents with  . Shortness of Breath  . Loss of Consciousness    HPI [name] is a 78 y.o. male.  Patient is a 78 year old male with extensive past medical history including coronary artery disease, CHF, COPD, colitis, hypertension.  He presents today for evaluation of weakness, shortness of breath for the past week.  This has been worsening significantly over the past few days.  He was seen by his primary doctor and given Zithromax, however this has not helped.  Patient does describe some swelling in his legs, however this is consistent with baseline.  According to the family, the patient becomes winded with even minimal exertion.  He can hardly get up to walk to the bathroom without becoming very winded and almost passing out.  He denies any black or bloody stool.  He denies any fevers or chills.  The history is provided by the patient.  Shortness of Breath  Severity:  Moderate Onset quality:  Gradual Duration:  1 week Progression:  Worsening Chronicity:  New Relieved by:  Nothing Worsened by:  Nothing Ineffective treatments:  None tried Associated symptoms: syncope   Loss of Consciousness  Associated symptoms: shortness of breath     Past Medical History:  Diagnosis Date  . Asthma   . BPH (benign prostatic hyperplasia)   . CAD (coronary artery disease) 01/1995  . Clostridium difficile colitis 04/2005  . Colitis 2011  . COPD (chronic obstructive pulmonary disease) ([place])   . Depression   . Diverticulosis   . DJD (degenerative joint disease)   . Gastric ulcer 04/17/10   Three 81m gastric ulcers, H.pylori serologies were negative  . GERD (gastroesophageal reflux disease)   . History of kidney stones   . Hyperlipidemia   . Hypertension   . Idiopathic chronic inflammatory bowel disease  05/18/2010   left-sided UC  . Kidney stone   . Morbid obesity ([place] 03/12/2018  . Obstructive sleep apnea   . Reflux 02/1995  . S/P endoscopy 07/24/10   retained gastric contents, benign bx    Patient Active Problem List   Diagnosis Date Noted  . Dyspnea on exertion 03/12/2018  . Morbid obesity ([place] 03/12/2018  . History of carpal tunnel surgery of left wrist with unlar head resection 01/17/17 01/24/2017  . Closed fracture of distal radius and ulna, left, with malunion, subsequent encounter   . Carpal tunnel syndrome of left wrist   . Fracture, [place], left, closed 11/29/2016  . Hx of skin cancer, basal cell 11/30/2015  . Erectile dysfunction 06/06/2015  . Essential hypertension 01/09/2013  . Hyperlipidemia 01/09/2013  . Chest pain at rest 12/14/2012  . CAD (coronary artery disease), native coronary artery 12/14/2012  . Hiatal hernia 12/14/2012  . GERD (gastroesophageal reflux disease) 12/14/2012  . Prediabetes 12/09/2012  . BPH (benign prostatic hyperplasia) 11/18/2012  . RUQ pain 09/04/2012  . Gallstones 05/09/2012  . COPD exacerbation ([place] 04/30/2012  . Obstructive sleep apnea 04/30/2012  . Ulcerative colitis ([place] 04/29/2012  . Ulcerative colitis, left sided ([place] 12/26/2011    Past Surgical History:  Procedure Laterality Date  . ANORECTAL MANOMETRY  2016   [facility]: concern for possible fissure. Noted pelvic floor dyssnyergy  . BIOPSY  07/29/2017   Procedure: BIOPSY;  Surgeon: RDaneil Dolin MD;  Location: [facility] SUITE;  Service: Endoscopy;;  ascending and sigmoid colon  . CARDIAC CATHETERIZATION     with stent  . CARDIOVASCULAR STRESS TEST  07/21/2009   No scintigraphic evidence of inducible myocardial ischemia  . CARPAL TUNNEL RELEASE Left 01/17/2017   Procedure: LEFT CARPAL TUNNEL RELEASE;  Surgeon: Carole Civil, MD;  Location: [facility] ORS;  Service: Orthopedics;  Laterality: Left;  . CATARACT EXTRACTION, BILATERAL Bilateral   . CERVICAL SPINE SURGERY      C4-5  . COLONOSCOPY  04/2005   granularity and friability erosions from rectum to 40cm. Bx infection vs IBD. C. Diff positive at the time.   . COLONOSCOPY  05/2010   [facility]: left-sided UC, bx with no dysplasia, shallow diverticula  . COLONOSCOPY [name] 11/07/2012   WUJ:WJXB-JYNWG proctocolitis status post segmental biopsy/Sigmoid colon polyps removed as described above. Procedure compromisd by technical difficulties. bx: Inflammation limited to sigmoid and rectum on pathology.  . COLONOSCOPY  04/2014   [facility] Nyoka [facility]den at [facility]: well localized proctocolitis limited to sigmoid  . COLONOSCOPY WITH PROPOFOL [name] 07/29/2017   diverticulosis in colon, three 4-6 mm polyps at splenic flexure and in cecum, one 10 mm polyp in rectum, abnormal rectum and sigmoid consistent with active UC  . CORONARY STENT PLACEMENT  01/1995  . ESOPHAGOGASTRODUODENOSCOPY  07/24/2010   NFA:OZHYQM esophagus  . ESOPHAGOGASTRODUODENOSCOPY  04/2014   [facility]: negative small bowel biopsies  . FOOT SURGERY Bilateral two  . KNEE SURGERY  two  . POLYPECTOMY  07/29/2017   Procedure: POLYPECTOMY;  Surgeon: Daneil Dolin, MD;  Location: [facility];  Service: Endoscopy;;  splenic flexure, ascending colon polyp;rectal  . SHOULDER SURGERY  two  . TONSILLECTOMY    . TRANSTHORACIC ECHOCARDIOGRAM  03/23/2009   EF 60-65%, normal LV systolic function  . ULNAR HEAD EXCISION Left 01/17/2017   Procedure: LEFT ULNAR HEAD RESECTION;  Surgeon: Carole Civil, MD;  Location: [facility] ORS;  Service: Orthopedics;  Laterality: Left;        Home Medications    Prior to Admission medications   Medication Sig Start Date End Date Taking? Authorizing Provider  acetaminophen (TYLENOL) 325 MG tablet Take 650 mg by mouth every 6 (six) hours as needed for moderate pain or headache.    [provider]  amLODipine (NORVASC) 2.5 MG tablet Take 1 tablet (2.5 mg total) by mouth daily. 03/12/18   [name], MD  aspirin 81 MG tablet Take  162 mg by mouth at bedtime.     [provider]  azithromycin (ZITHROMAX Z-PAK) 250 MG tablet Take 2 tablets (500 mg) on  Day 1,  followed by 1 tablet (250 mg) once daily on Days 2 through 5. 03/12/18   Luking, Elayne Snare, MD  doxazosin (CARDURA) 4 MG tablet Take 1 tablet (4 mg total) by mouth at bedtime. Patient taking differently: Take 4 mg by mouth at bedtime. HOLD HOLD HOLD 03/12/2018 ov 11/05/17   [name], MD  furosemide (LASIX) 20 MG tablet One q am prn pedal edema 11/05/17   [name], MD  losartan (COZAAR) 25 MG tablet TAKE 1 TABLET BY MOUTH ONCE DAILY 01/23/18   [name], MD  metoprolol tartrate (LOPRESSOR) 50 MG tablet One po QHS 03/12/18   Luking, Scott A, MD  nitroGLYCERIN (NITROSTAT) 0.4 MG SL tablet Place 0.4 mg under the tongue every 5 (five) minutes as needed for chest pain.    [provider]  OVER THE COUNTER MEDICATION Omega XL 4 tablets at night  [provider]  OVER THE COUNTER MEDICATION Take 1 tablet by mouth at bedtime. Strong Heart Otc supplement one per day    [provider]  pravastatin (PRAVACHOL) 40 MG tablet Take 1 tablet (40 mg total) by mouth at bedtime. 02/27/18   [name], MD  raNITIdine HCl (ACID REDUCER PO) Take 2 tablets by mouth at bedtime.    [provider]  sertraline (ZOLOFT) 50 MG tablet Take 1 tablet (50 mg total) by mouth daily. 03/12/18   [name], MD    Family History Family History  Problem Relation Age of Onset  . Lung cancer Mother   . Cancer Mother        breast  . Diabetes Mother   . Stroke Father   . Hypertension Father   . Kidney failure Brother   . Other Child        blood infection  . Colon cancer Neg Hx     Social History Social History   Tobacco Use  . Smoking status: Former Smoker    Packs/day: 1.00    Years: 20.00    Pack years: 20.00    Types: Cigarettes    Last attempt to quit: 09/30/1969    Years since quitting: 48.4  . Smokeless tobacco: Never  Used  Substance Use Topics  . Alcohol use: No  . Drug use: No     Allergies   Patient has no known allergies.   Review of Systems Review of Systems  Respiratory: Positive for shortness of breath.   Cardiovascular: Positive for syncope.  All other systems reviewed and are negative.    Physical Exam Updated Vital Signs BP (!) 155/64 (BP Location: Left Arm)   Pulse 67   Temp 98.2 F (36.8 C) (Oral)   Resp (!) 28   SpO2 93%   Physical Exam Vitals signs and nursing note reviewed.  Constitutional:      General: He is not in acute distress.    Appearance: He is well-developed. He is not diaphoretic.  HENT:     Head: Normocephalic and atraumatic.  Neck:     Musculoskeletal: Normal range of motion and neck supple.  Cardiovascular:     Rate and Rhythm: Normal rate and regular rhythm.     Heart sounds: No murmur. No friction rub.  Pulmonary:     Effort: Pulmonary effort is normal. No respiratory distress.     Breath sounds: Examination of the right-middle field reveals rales. Examination of the left-middle field reveals rales. Examination of the right-lower field reveals rales. Examination of the left-lower field reveals rales. Rales present. No wheezing.  Abdominal:     General: Bowel sounds are normal. There is no distension.     Palpations: Abdomen is soft.     Tenderness: There is no abdominal tenderness.  Musculoskeletal: Normal range of motion.     Right lower leg: Edema present.     Left lower leg: Edema present.  Skin:    General: Skin is warm and dry.  Neurological:     Mental Status: He is alert and oriented to person, place, and time.     Coordination: Coordination normal.      ED Treatments / Results  Labs (all labs ordered are listed, but only abnormal results are displayed) Labs Reviewed  COMPREHENSIVE METABOLIC PANEL  CBC WITH DIFFERENTIAL/PLATELET  BRAIN NATRIURETIC PEPTIDE  I-STAT TROPONIN, ED    EKG EKG Interpretation  Date/Time:  Friday  March 14 2018 23:43:26 EST  Ventricular Rate:  73 PR Interval:    QRS Duration: 100 QT Interval:  390 QTC Calculation: 430 R Axis:   -15 Text Interpretation:  Sinus rhythm Borderline prolonged PR interval Borderline left axis deviation Abnormal T, consider ischemia, lateral leads Confirmed by Veryl Speak [phone]) on 03/15/2018 12:55:14 AM   Radiology Dg Chest 2 View  Result Date: 03/15/2018 CLINICAL DATA:  Shortness of breath EXAM: CHEST - 2 VIEW COMPARISON:  03/12/2018 FINDINGS: Cardiomegaly. No confluent airspace opacities or effusions. No overt edema. No acute bony abnormality. IMPRESSION: Cardiomegaly.  No active disease. Electronically Signed   By: Rolm Baptise M.D.   On: 03/15/2018 00:35    Procedures Procedures (including critical care time)  Medications Ordered in ED Medications  ipratropium-albuterol (DUONEB) 0.5-2.5 (3) MG/3ML nebulizer solution 3 mL (has no administration in time range)     Initial Impression / Assessment and Plan / ED Course  I have reviewed the triage vital signs and the nursing notes.  Pertinent labs & imaging results that were available during my care of the patient were reviewed by me and considered in my medical decision making (see chart for details).  Patient is a 78 year old male with history of CHF and COPD presenting with complaints of dyspnea.  This is worsened over the past week.  This occurs with minimal exertion.  His work-up today reveals an elevated troponin of 0.14 with no obvious EKG changes.  I am uncertain as to the exact etiology of this.  He does have a history of COPD and this may be the cause.  He was given a breathing treatment and steroids.  Patient also underwent a CT scan of the chest to rule out pulmonary embolism.  This was negative.  I have discussed the care with Dr. Hal Hope from the hospitalist service who agrees to admit.  CRITICAL CARE Performed by: Veryl Speak Total critical care time: 45 minutes Critical care  time was exclusive of separately billable procedures and treating other patients. Critical care was necessary to treat or prevent imminent or life-threatening deterioration. Critical care was time spent personally by me on the following activities: development of treatment plan with patient and/or surrogate as well as nursing, discussions with consultants, evaluation of patient's response to treatment, examination of patient, obtaining history from patient or surrogate, ordering and performing treatments and interventions, ordering and review of laboratory studies, ordering and review of radiographic studies, pulse oximetry and re-evaluation of patient's condition.   Final Clinical Impressions(s) / ED Diagnoses   Final diagnoses:  None    ED Discharge Orders    None       Veryl Speak, MD 03/15/18 [phone]

## 2018-03-15 NOTE — Progress Notes (Signed)
  Echocardiogram 2D Echocardiogram has been performed.  [name] 03/15/2018, 12:47 PM

## 2018-03-16 ENCOUNTER — Ambulatory Visit (HOSPITAL_COMMUNITY)
Admit: 2018-03-16 | Discharge: 2018-03-16 | Disposition: A | Discharge status: Home / Self Care | Payer: Medicare HMO | Attending: Cardiology | Admitting: Cardiology

## 2018-03-16 DIAGNOSIS — I2583 Coronary atherosclerosis due to lipid rich plaque: Secondary | ICD-10-CM | POA: Diagnosis not present

## 2018-03-16 DIAGNOSIS — Z823 Family history of stroke: Secondary | ICD-10-CM | POA: Diagnosis not present

## 2018-03-16 DIAGNOSIS — E7800 Pure hypercholesterolemia, unspecified: Secondary | ICD-10-CM | POA: Diagnosis not present

## 2018-03-16 DIAGNOSIS — I5033 Acute on chronic diastolic (congestive) heart failure: Secondary | ICD-10-CM | POA: Diagnosis not present

## 2018-03-16 DIAGNOSIS — I252 Old myocardial infarction: Secondary | ICD-10-CM | POA: Diagnosis not present

## 2018-03-16 DIAGNOSIS — I2510 Atherosclerotic heart disease of native coronary artery without angina pectoris: Secondary | ICD-10-CM | POA: Diagnosis not present

## 2018-03-16 DIAGNOSIS — Z79899 Other long term (current) drug therapy: Secondary | ICD-10-CM | POA: Diagnosis not present

## 2018-03-16 DIAGNOSIS — G4733 Obstructive sleep apnea (adult) (pediatric): Secondary | ICD-10-CM | POA: Diagnosis present

## 2018-03-16 DIAGNOSIS — I110 Hypertensive heart disease with heart failure: Secondary | ICD-10-CM | POA: Diagnosis not present

## 2018-03-16 DIAGNOSIS — R0602 Shortness of breath: Secondary | ICD-10-CM | POA: Diagnosis not present

## 2018-03-16 DIAGNOSIS — Z8711 Personal history of peptic ulcer disease: Secondary | ICD-10-CM | POA: Diagnosis not present

## 2018-03-16 DIAGNOSIS — N401 Enlarged prostate with lower urinary tract symptoms: Secondary | ICD-10-CM | POA: Diagnosis not present

## 2018-03-16 DIAGNOSIS — J9601 Acute respiratory failure with hypoxia: Secondary | ICD-10-CM | POA: Diagnosis not present

## 2018-03-16 DIAGNOSIS — F329 Major depressive disorder, single episode, unspecified: Secondary | ICD-10-CM | POA: Diagnosis present

## 2018-03-16 DIAGNOSIS — R69 Illness, unspecified: Secondary | ICD-10-CM | POA: Diagnosis not present

## 2018-03-16 DIAGNOSIS — Z955 Presence of coronary angioplasty implant and graft: Secondary | ICD-10-CM | POA: Diagnosis not present

## 2018-03-16 DIAGNOSIS — Z841 Family history of disorders of kidney and ureter: Secondary | ICD-10-CM | POA: Diagnosis not present

## 2018-03-16 DIAGNOSIS — K219 Gastro-esophageal reflux disease without esophagitis: Secondary | ICD-10-CM | POA: Diagnosis present

## 2018-03-16 DIAGNOSIS — Z6838 Body mass index (BMI) 38.0-38.9, adult: Secondary | ICD-10-CM | POA: Diagnosis not present

## 2018-03-16 DIAGNOSIS — E785 Hyperlipidemia, unspecified: Secondary | ICD-10-CM | POA: Diagnosis not present

## 2018-03-16 DIAGNOSIS — Z87891 Personal history of nicotine dependence: Secondary | ICD-10-CM | POA: Diagnosis not present

## 2018-03-16 DIAGNOSIS — Z87442 Personal history of urinary calculi: Secondary | ICD-10-CM | POA: Diagnosis not present

## 2018-03-16 DIAGNOSIS — I5031 Acute diastolic (congestive) heart failure: Secondary | ICD-10-CM | POA: Diagnosis not present

## 2018-03-16 DIAGNOSIS — J449 Chronic obstructive pulmonary disease, unspecified: Secondary | ICD-10-CM | POA: Diagnosis present

## 2018-03-16 DIAGNOSIS — Z7982 Long term (current) use of aspirin: Secondary | ICD-10-CM | POA: Diagnosis not present

## 2018-03-16 DIAGNOSIS — Z833 Family history of diabetes mellitus: Secondary | ICD-10-CM | POA: Diagnosis not present

## 2018-03-16 DIAGNOSIS — R0609 Other forms of dyspnea: Secondary | ICD-10-CM | POA: Diagnosis not present

## 2018-03-16 DIAGNOSIS — I10 Essential (primary) hypertension: Secondary | ICD-10-CM | POA: Diagnosis not present

## 2018-03-16 DIAGNOSIS — Z801 Family history of malignant neoplasm of trachea, bronchus and lung: Secondary | ICD-10-CM | POA: Diagnosis not present

## 2018-03-16 DIAGNOSIS — Z8249 Family history of ischemic heart disease and other diseases of the circulatory system: Secondary | ICD-10-CM | POA: Diagnosis not present

## 2018-03-16 LAB — COMPREHENSIVE METABOLIC PANEL
ALT: 17 U/L (ref 0–44)
AST: 22 U/L (ref 15–41)
Albumin: 3.2 g/dL — ABNORMAL LOW (ref 3.5–5.0)
Alkaline Phosphatase: 50 U/L (ref 38–126)
Anion gap: 9 (ref 5–15)
BUN: 16 mg/dL (ref 8–23)
CO2: 25 mmol/L (ref 22–32)
Calcium: 8.9 mg/dL (ref 8.9–10.3)
Chloride: 105 mmol/L (ref 98–111)
Creatinine, Ser: 0.91 mg/dL (ref 0.61–1.24)
GFR calc Af Amer: >60 mL/min (ref >60)
GFR calc non Af Amer: >60 mL/min (ref >60)
Glucose, Bld: 112 mg/dL — ABNORMAL HIGH (ref 70–99)
Potassium: 4.1 mmol/L (ref 3.5–5.1)
Sodium: 139 mmol/L (ref 135–145)
Total Bilirubin: 0.6 mg/dL (ref 0.3–1.2)
Total Protein: 7.3 g/dL (ref 6.5–8.1)

## 2018-03-16 LAB — CBC
HCT: 44.4 % (ref 39.0–52.0)
Hemoglobin: 13.9 g/dL (ref 13.0–17.0)
MCH: 28.6 pg (ref 26.0–34.0)
MCHC: 31.3 g/dL (ref 30.0–36.0)
MCV: 91.4 fL (ref 80.0–100.0)
Platelets: 301 10*3/uL (ref 150–400)
RBC: 4.86 MIL/uL (ref 4.22–5.81)
RDW: 13.3 % (ref 11.5–15.5)
WBC: 9.2 10*3/uL (ref 4.0–10.5)
nRBC: 0 % (ref 0.0–0.2)

## 2018-03-16 MED ORDER — LOSARTAN POTASSIUM 25 MG PO TABS
25.0000 mg | ORAL_TABLET | Freq: Every day | ORAL | Status: DC
  Administered 2018-03-16 – 2018-03-18 (×3): 25 mg via ORAL
  Filled 2018-03-16 (×4): qty 1

## 2018-03-16 MED ORDER — TECHNETIUM TC 99M TETROFOSMIN IV KIT
30.0000 | PACK | Freq: Once | INTRAVENOUS | Status: AC | PRN
  Administered 2018-03-16: 30 via INTRAVENOUS

## 2018-03-16 MED ORDER — METOPROLOL TARTRATE 50 MG PO TABS
50.0000 mg | ORAL_TABLET | Freq: Every day | ORAL | Status: DC
  Administered 2018-03-16 – 2018-03-17 (×2): 50 mg via ORAL
  Filled 2018-03-16 (×2): qty 1

## 2018-03-16 MED ORDER — ASPIRIN EC 81 MG PO TBEC
81.0000 mg | DELAYED_RELEASE_TABLET | Freq: Every day | ORAL | Status: DC
  Administered 2018-03-16 – 2018-03-17 (×2): 81 mg via ORAL
  Filled 2018-03-16 (×2): qty 1

## 2018-03-16 NOTE — Progress Notes (Addendum)
Progress Note  Patient Name: [name] Date of Encounter: 03/16/2018  Primary Cardiologist: No primary care provider on file.   Subjective   SOB and LE edema improved.  Diuresing well.  Inpatient Medications    Scheduled Meds: . aspirin EC  162 mg Oral QHS  . enoxaparin (LOVENOX) injection  60 mg Subcutaneous Q24H  . furosemide  40 mg Intravenous Q12H  . metoprolol tartrate  25 mg Oral QHS  . pravastatin  40 mg Oral QHS  . sertraline  50 mg Oral Daily   Continuous Infusions:  PRN Meds: acetaminophen   Vital Signs    Vitals:   03/15/18 0645 03/15/18 1415 03/15/18 2126 03/16/18 0544  BP: (!) 171/62 (!) 145/66 (!) 157/65 (!) 159/80  Pulse: 70 70 68 71  Resp: 20 (!) 22 20 20   Temp: 98.2 F (36.8 C) 97.9 F (36.6 C) 98.3 F (36.8 C) 98.5 F (36.9 C)  TempSrc: Oral Oral Oral Oral  SpO2: 96% 93% 91% 91%  Weight:  132 kg  132.5 kg  Height:  6' 1"  (1.854 m)      Intake/Output Summary (Last 24 hours) at 03/16/2018 0834 Last data filed at 03/16/2018 3825 Gross per 24 hour  Intake 240 ml  Output 2935 ml  Net -2695 ml   Filed Weights   03/15/18 1415 03/16/18 0544  Weight: 132 kg 132.5 kg    Telemetry    NSR - Personally Reviewed  ECG    No new EKG to review - Personally Reviewed  Physical Exam   GEN: No acute distress.   Neck: No JVD Cardiac: RRR, no murmurs, rubs, or gallops.  Respiratory: Clear to auscultation bilaterally. GI: Soft, nontender, non-distended  MS: trace LE edema; No deformity. Neuro:  Nonfocal  Psych: Normal affect   Labs    Chemistry Recent Labs  Lab 03/12/18 1452 03/15/18 0055 03/16/18 0556  NA 140 139 139  K 4.3 4.0 4.1  CL 105 107 105  CO2 19* 25 25  GLUCOSE 92 123* 112*  BUN 20 18 16   CREATININE 1.09 0.97 0.91  CALCIUM 9.2 8.8* 8.9  PROT 6.8 7.3 7.3  ALBUMIN 3.4* 3.3* 3.2*  AST 48* 26 22  ALT 20 20 17   ALKPHOS 69 61 50  BILITOT 0.4 0.6 0.6  GFRNONAA 64 >60 >60  GFRAA 74 >60 >60  ANIONGAP  --  7 9      Hematology Recent Labs  Lab 03/12/18 1452 03/15/18 0055 03/16/18 0556  WBC 11.7* 9.8 9.2  RBC 4.88 4.57 4.86  HGB 14.3 13.4 13.9  HCT 42.8 42.1 44.4  MCV 88 92.1 91.4  MCH 29.3 29.3 28.6  MCHC 33.4 31.8 31.3  RDW 13.2 13.4 13.3  PLT 247 256 301    Cardiac Enzymes Recent Labs  Lab 03/15/18 0814 03/15/18 1356 03/15/18 2002  TROPONINI 0.10* 0.09* 0.07*    Recent Labs  Lab 03/15/18 0128  TROPIPOC 0.14*     BNP Recent Labs  Lab 03/12/18 1452 03/15/18 0055  BNP 82.1 76.2     DDimer No results for input(s): DDIMER in the last 168 hours.   Radiology    Dg Chest 2 View  Result Date: 03/15/2018 CLINICAL DATA:  Shortness of breath EXAM: CHEST - 2 VIEW COMPARISON:  03/12/2018 FINDINGS: Cardiomegaly. No confluent airspace opacities or effusions. No overt edema. No acute bony abnormality. IMPRESSION: Cardiomegaly.  No active disease. Electronically Signed   By: Rolm Baptise M.D.   On: 03/15/2018 00:35  Ct Angio Chest Pe W And/or Wo Contrast  Result Date: 03/15/2018 CLINICAL DATA:  Shortness of breath and syncopal episodes over the past week. EXAM: CT ANGIOGRAPHY CHEST WITH CONTRAST TECHNIQUE: Multidetector CT imaging of the chest was performed using the standard protocol during bolus administration of intravenous contrast. Multiplanar CT image reconstructions and MIPs were obtained to evaluate the vascular anatomy. CONTRAST:  152m ISOVUE-370 IOPAMIDOL (ISOVUE-370) INJECTION 76% COMPARISON:  Chest CT from 2015. FINDINGS: Cardiovascular: The heart is enlarged but stable when compared to recent chest x-rays. Progressive since compared to 2015 chest CT. No pericardial effusion. The aorta is normal in caliber. No dissection. Mild-to-moderate atherosclerotic calcifications. There are fairly extensive three-vessel coronary artery calcifications. The pulmonary arterial tree is suboptimally opacified. Moderate artifact related to breathing. No large central pulmonary emboli are  identified. Mediastinum/Nodes: Small scattered mediastinal and hilar lymph nodes but no mass or overt adenopathy. The esophagus is grossly normal. Lungs/Pleura: Significant upper lobe predominant emphysematous changes with superimposed interstitial and airspace process, likely pulmonary edema. Very small pleural effusions are noted along with bibasilar atelectasis. No focal airspace consolidation to suggest pneumonia. No worrisome pulmonary lesions. Upper Abdomen: No significant upper abdominal findings. Musculoskeletal: No significant bony findings. Possible changes of ankylosing spondylitis involving the thoracic spine. Review of the MIP images confirms the above findings. IMPRESSION: 1. Limited examination but no large central pulmonary emboli. 2. No aortic aneurysm or dissection. 3. Significant three-vessel coronary artery calcifications. 4. Cardiac enlargement. 5. Emphysematous changes with superimposed pulmonary edema. There also small effusions and bibasilar atelectasis. 6. No worrisome pulmonary lesions. Aortic Atherosclerosis (ICD10-I70.0) and Emphysema (ICD10-J43.9). Electronically Signed   By: PMarijo SanesM.D.   On: 03/15/2018 04:37    Cardiac Studies   2D echo 03/2018  IMPRESSIONS   1. The left ventricle has normal systolic function of 650-35% The cavity size was normal. There is mildly increased left ventricular wall thickness. Echo evidence of impaired diastolic relaxation.  2. No evidence of left ventricular regional wall motion abnormalities.  3. The right ventricle has normal systolic function. The cavity was normal. There is no increase in right ventricular wall thickness. Right ventricular systolic pressure could not be assessed.  4. Left atrial size was mildly dilated.  5. The mitral valve is normal in structure. There is mild calcification.  6. The tricuspid valve is normal in structure.  7. The aortic valve is normal in structure. There is sclerosis without any evidence of  stenosis of the aortic valve.  8. There is dilatation of the aortic root 42 mm.  Patient Profile     78y.o. male with a history of obesity, CAD with remote PCI stenting of the LAD in 1996 in the setting of anterior wall MI hypertension and hyperlipidemia.  His last myocardial perfusion scan showed no ischemia in 2011.  He was just seen in the audiology office by Dr. BGwenlyn Found2 days ago with complaints of shortness of breath as well as intermittent chest pain and plan was to get a 2D echo and pharmacologic Myoview stress test.  Apparently he has been less mobile over the past few weeks due to shortness of breath and can barely walk in the house.  He is also been complaining of intermittent chest pressure when he gets out of breath.  He is complained of lower extremity edema as well as abdominal distention and has gained 30 pounds in the past year.  His family says that he is eating a lot of processed foods and lives  by himself and is not compliant with a low-sodium diet.  Assessment & Plan    1.  Dyspnea on exertion  -Etiology unclear but suspect volume overload -His last 2D echo was done in 2011 showing normal LV function with EF 60 to 65% and mild MR -2D echo this admission showed normal LVF and diastolic dysfunction and no focal wall motion abnormalities. -BNP is normal but this may be falsely low in the setting of obesity -Troponin is minimally elevated with flat trend (0.1>0.09>0.07). -plan for [facility] tomorrow once fully diuresed -Continue full dose Lovenox, ASA, beta-blocker and statin.  2.  ASCAD -Status post remote anterior wall MI with PCI of the LAD in 1996 with no ischemia on Myoview in 2011 -Continue aspirin, statin and beta-blocker  3.  Hyperlipidemia -LDL goal is<70 -LDL was 49 on 03/12/2018 -Continue simvastatin  4.  COPD -Chest x-ray showed no active disease  5.  Acute CHF -LVF has been normal in the past -he had significant LE edema on admit but cxray and BNP  are normal -BNP may be falsely low in setting of obesity  -currently on Lasix 61m IV BID -He put our 3.1L yesterday and is net neg 3L -no change in weight ? Accuracy -still has mild LE edema so will continue Lasix IV one more day -creatinine stable at 0.91 and K+ 4.1 -follow I&Os and daily weights  6.  HTN -BP elevated -will place back on home meds Losartan 254mdaily and increase back to lopressor tartrate 5072mhs      For questions or updates, please contact [facility] consult [email or web address] for contact info under Cardiology/STEMI.      Signed, TraFransico HimD  03/16/2018, 8:34 AM

## 2018-03-16 NOTE — Progress Notes (Signed)
PROGRESS NOTE  [name]  KPV:[id number] DOB: Aug 22, 1940 DOA: 03/14/2018 PCP: [name], MD  Outpatient Specialists: GI, Dr. Gala Romney; Cardiology, Dr. Gwenlyn Found Brief Narrative: [name] is a 78 y.o. male with a history of CAD s/p PCI, obesity, proctocolitis, HTN, and HLD who presented to the hospital with a month of progressive dyspnea on exertion and associated chest tightness associated with decreased mobility and leg swelling and 30lbs weight gain over past 12 months. He was hypoxic. Troponin found to be mildly elevated and felt to be volume overloaded. Cardiology was consulted, IV lasix given and stress testing was recommended.  Assessment & Plan: Active Problems:   Exertional dyspnea   Dyspnea  Acute hypoxic respiratory failure: Due to acute on chronic CHF, unknown type.  - Echocardiogram pending - Continue IV lasix today with likely deescalation 2/10. 3L UOP yesterday - Wean to room air as able.  - Monitor weight, I/O, BMP in AM  Chest tightness, troponin elevation, history of CAD with PCI:  - 2 day stress testing begun 2/9.  - continue metoprolol, ASA, statin  Morbid obesity: BMI 38.  - Weight loss recommended  Proctocolitis: Treatment, per records, complicated by limited adherence and financial constraints/insurance limitations. - Last medication recommended in Sept by Roseanne Kaufman was balsalazide. Will Rx that.  - CM consulted for insurance assistance and to arrange home health assistance at discharge.  HTN:  - Continue metoprolol, holding norvasc pending echo  HLD:  - Continue statin  Depression:  - Continue zoloft  Urinary frequency: No significant postvoid residual found on bladder scan.   Diverticulosis: Noted, no abdominal pain or overt GI bleeding  DVT prophylaxis: Lovenox Code Status: Full Family Communication: Multiple at bedside, including daughters x2 Disposition Plan: Home once improved.  Consultants:   Cardiology  Procedures:    Stress testing  Antimicrobials:  None   Subjective: "Hangry" because he hasn't been able to eat for a while due to testing. No chest tightness, feels breathing is a little better but not nearly baseline.   Objective: Vitals:   03/15/18 1415 03/15/18 2126 03/16/18 0544 03/16/18 1528  BP: (!) 145/66 (!) 157/65 (!) 159/80 (!) 153/76  Pulse: 70 68 71 68  Resp: (!) 22 20 20 20   Temp: 97.9 F (36.6 C) 98.3 F (36.8 C) 98.5 F (36.9 C) 97.9 F (36.6 C)  TempSrc: Oral Oral Oral Oral  SpO2: 93% 91% 91% 92%  Weight: 132 kg  132.5 kg   Height: 6' 1"  (1.854 m)       Intake/Output Summary (Last 24 hours) at 03/16/2018 1710 Last data filed at 03/16/2018 6754 Gross per 24 hour  Intake 240 ml  Output 1760 ml  Net -1520 ml   Filed Weights   03/15/18 1415 03/16/18 0544  Weight: 132 kg 132.5 kg    Gen: Elderly male in no distress  Pulm: Non-labored breathing room air. Crackles at bilateral bases.  CV: Regular rate and rhythm. No murmur, rub, or gallop. No JVD, + pedal edema. GI: Abdomen soft, non-tender, non-distended, with normoactive bowel sounds. No organomegaly or masses felt. Ext: Warm, no deformities Skin: No rashes, lesions or ulcers Neuro: Alert and oriented. No focal neurological deficits. Psych: Judgement and insight appear normal. Mood & affect appropriate.   Data Reviewed: I have personally reviewed following labs and imaging studies  CBC: Recent Labs  Lab 03/12/18 1452 03/15/18 0055 03/16/18 0556  WBC 11.7* 9.8 9.2  NEUTROABS 6.5 5.1  --   HGB 14.3 13.4  13.9  HCT 42.8 42.1 44.4  MCV 88 92.1 91.4  PLT 247 256 025   Basic Metabolic Panel: Recent Labs  Lab 03/12/18 1452 03/15/18 0055 03/16/18 0556  NA 140 139 139  K 4.3 4.0 4.1  CL 105 107 105  CO2 19* 25 25  GLUCOSE 92 123* 112*  BUN 20 18 16   CREATININE 1.09 0.97 0.91  CALCIUM 9.2 8.8* 8.9   GFR: Estimated Creatinine Clearance: 93.9 mL/min (by C-G formula based on SCr of 0.91 mg/dL). Liver  Function Tests: Recent Labs  Lab 03/12/18 1452 03/15/18 0055 03/16/18 0556  AST 48* 26 22  ALT 20 20 17   ALKPHOS 69 61 50  BILITOT 0.4 0.6 0.6  PROT 6.8 7.3 7.3  ALBUMIN 3.4* 3.3* 3.2*   No results for input(s): LIPASE, AMYLASE in the last 168 hours. No results for input(s): AMMONIA in the last 168 hours. Coagulation Profile: No results for input(s): INR, PROTIME in the last 168 hours. Cardiac Enzymes: Recent Labs  Lab 03/15/18 0814 03/15/18 1356 03/15/18 2002  TROPONINI 0.10* 0.09* 0.07*   BNP (last 3 results) No results for input(s): PROBNP in the last 8760 hours. HbA1C: No results for input(s): HGBA1C in the last 72 hours. CBG: No results for input(s): GLUCAP in the last 168 hours. Lipid Profile: No results for input(s): CHOL, HDL, LDLCALC, TRIG, CHOLHDL, LDLDIRECT in the last 72 hours. Thyroid Function Tests: No results for input(s): TSH, T4TOTAL, FREET4, T3FREE, THYROIDAB in the last 72 hours. Anemia Panel: No results for input(s): VITAMINB12, FOLATE, FERRITIN, TIBC, IRON, RETICCTPCT in the last 72 hours. Urine analysis:    Component Value Date/Time   COLORURINE YELLOW 08/04/2014 1620   APPEARANCEUR CLEAR 08/04/2014 1620   LABSPEC 1.020 08/04/2014 1620   PHURINE 6.0 08/04/2014 1620   GLUCOSEU NEGATIVE 08/04/2014 1620   HGBUR SMALL (A) 08/04/2014 1620   BILIRUBINUR NEGATIVE 08/04/2014 1620   BILIRUBINUR ++ 11/18/2012 1319   KETONESUR NEGATIVE 08/04/2014 1620   PROTEINUR NEGATIVE 08/04/2014 1620   UROBILINOGEN 0.2 08/04/2014 1620   NITRITE NEGATIVE 08/04/2014 1620   LEUKOCYTESUR NEGATIVE 08/04/2014 1620   No results found for this or any previous visit (from the past 240 hour(s)).    Radiology Studies: Dg Chest 2 View  Result Date: 03/15/2018 CLINICAL DATA:  Shortness of breath EXAM: CHEST - 2 VIEW COMPARISON:  03/12/2018 FINDINGS: Cardiomegaly. No confluent airspace opacities or effusions. No overt edema. No acute bony abnormality. IMPRESSION:  Cardiomegaly.  No active disease. Electronically Signed   By: Rolm Baptise M.D.   On: 03/15/2018 00:35   Ct Angio Chest Pe W And/or Wo Contrast  Result Date: 03/15/2018 CLINICAL DATA:  Shortness of breath and syncopal episodes over the past week. EXAM: CT ANGIOGRAPHY CHEST WITH CONTRAST TECHNIQUE: Multidetector CT imaging of the chest was performed using the standard protocol during bolus administration of intravenous contrast. Multiplanar CT image reconstructions and MIPs were obtained to evaluate the vascular anatomy. CONTRAST:  154m ISOVUE-370 IOPAMIDOL (ISOVUE-370) INJECTION 76% COMPARISON:  Chest CT from 2015. FINDINGS: Cardiovascular: The heart is enlarged but stable when compared to recent chest x-rays. Progressive since compared to 2015 chest CT. No pericardial effusion. The aorta is normal in caliber. No dissection. Mild-to-moderate atherosclerotic calcifications. There are fairly extensive three-vessel coronary artery calcifications. The pulmonary arterial tree is suboptimally opacified. Moderate artifact related to breathing. No large central pulmonary emboli are identified. Mediastinum/Nodes: Small scattered mediastinal and hilar lymph nodes but no mass or overt adenopathy. The esophagus is grossly normal.  Lungs/Pleura: Significant upper lobe predominant emphysematous changes with superimposed interstitial and airspace process, likely pulmonary edema. Very small pleural effusions are noted along with bibasilar atelectasis. No focal airspace consolidation to suggest pneumonia. No worrisome pulmonary lesions. Upper Abdomen: No significant upper abdominal findings. Musculoskeletal: No significant bony findings. Possible changes of ankylosing spondylitis involving the thoracic spine. Review of the MIP images confirms the above findings. IMPRESSION: 1. Limited examination but no large central pulmonary emboli. 2. No aortic aneurysm or dissection. 3. Significant three-vessel coronary artery calcifications.  4. Cardiac enlargement. 5. Emphysematous changes with superimposed pulmonary edema. There also small effusions and bibasilar atelectasis. 6. No worrisome pulmonary lesions. Aortic Atherosclerosis (ICD10-I70.0) and Emphysema (ICD10-J43.9). Electronically Signed   By: Marijo Sanes M.D.   On: 03/15/2018 04:37    Scheduled Meds: . aspirin EC  81 mg Oral QHS  . enoxaparin (LOVENOX) injection  60 mg Subcutaneous Q24H  . furosemide  40 mg Intravenous Q12H  . losartan  25 mg Oral Daily  . metoprolol tartrate  50 mg Oral QHS  . pravastatin  40 mg Oral QHS  . sertraline  50 mg Oral Daily   Continuous Infusions:   LOS: 0 days   Time spent: 25 minutes.  Patrecia Pour, MD [facility] [email or web address] Password [email or web address] 03/16/2018, 5:10 PM

## 2018-03-16 NOTE — Progress Notes (Signed)
PT Cancellation Note  Patient Details Name: MCCLELLAN DEMARAIS MRN: [id number] DOB: 1941/01/31   Cancelled Treatment:    Reason Eval/Treat Not Completed: Patient at procedure or test/unavailable. Procedure @ [facility]. Will check back on tomorrow.    Weston Anna, PT Acute Rehabilitation Services Pager: [phone] Office: [phone]

## 2018-03-17 ENCOUNTER — Encounter: Payer: Self-pay | Admitting: Internal Medicine

## 2018-03-17 DIAGNOSIS — E7800 Pure hypercholesterolemia, unspecified: Secondary | ICD-10-CM

## 2018-03-17 DIAGNOSIS — I2510 Atherosclerotic heart disease of native coronary artery without angina pectoris: Secondary | ICD-10-CM

## 2018-03-17 DIAGNOSIS — I2583 Coronary atherosclerosis due to lipid rich plaque: Secondary | ICD-10-CM

## 2018-03-17 DIAGNOSIS — I5033 Acute on chronic diastolic (congestive) heart failure: Secondary | ICD-10-CM

## 2018-03-17 LAB — BASIC METABOLIC PANEL
Anion gap: 9 (ref 5–15)
BUN: 22 mg/dL (ref 8–23)
CO2: 27 mmol/L (ref 22–32)
Calcium: 9 mg/dL (ref 8.9–10.3)
Chloride: 104 mmol/L (ref 98–111)
Creatinine, Ser: 0.98 mg/dL (ref 0.61–1.24)
GFR calc Af Amer: >60 mL/min (ref >60)
GFR calc non Af Amer: >60 mL/min (ref >60)
Glucose, Bld: 113 mg/dL — ABNORMAL HIGH (ref 70–99)
Potassium: 3.8 mmol/L (ref 3.5–5.1)
Sodium: 140 mmol/L (ref 135–145)

## 2018-03-17 LAB — NM MYOCAR MULTI W/SPECT W/WALL MOTION / EF
Peak HR: 81 {beats}/min
Rest HR: 64 {beats}/min

## 2018-03-17 MED ORDER — AMLODIPINE BESYLATE 5 MG PO TABS
2.5000 mg | ORAL_TABLET | Freq: Every day | ORAL | Status: DC
  Administered 2018-03-17 – 2018-03-18 (×2): 2.5 mg via ORAL
  Filled 2018-03-17 (×2): qty 1

## 2018-03-17 MED ORDER — REGADENOSON 0.4 MG/5ML IV SOLN
0.4000 mg | Freq: Once | INTRAVENOUS | Status: AC
  Administered 2018-03-17: 0.4 mg via INTRAVENOUS
  Filled 2018-03-17: qty 5

## 2018-03-17 MED ORDER — TECHNETIUM TC 99M TETROFOSMIN IV KIT
30.0000 | PACK | Freq: Once | INTRAVENOUS | Status: AC | PRN
  Administered 2018-03-17: 30 via INTRAVENOUS

## 2018-03-17 MED ORDER — REGADENOSON 0.4 MG/5ML IV SOLN
INTRAVENOUS | Status: AC
  Filled 2018-03-17: qty 5

## 2018-03-17 MED ORDER — LOPERAMIDE HCL 2 MG PO CAPS
4.0000 mg | ORAL_CAPSULE | Freq: Once | ORAL | Status: AC
  Administered 2018-03-17: 4 mg via ORAL
  Filled 2018-03-17: qty 2

## 2018-03-17 NOTE — Progress Notes (Signed)
PT Cancellation Note  Patient Details Name: KAYCEN WHITWORTH MRN: [id number] DOB: [birth year]   Cancelled Treatment:    Reason Eval/Treat Not Completed: Patient at procedure or test/unavailable. Will check back as schedule allows. Thanks.    Weston Anna, PT Acute Rehabilitation Services Pager: [phone] Office: [phone]

## 2018-03-17 NOTE — Progress Notes (Signed)
OT Cancellation Note  Patient Details Name: [name] MRN: [id number] DOB: 1940/02/07   Cancelled Treatment:    Reason Eval/Treat Not Completed: Patient at procedure or test/ unavailable; will follow up for OT eval as schedule permits.  Lou Cal, OT Supplemental Rehabilitation Services Pager [phone] Office [place] 03/17/2018, 11:07 AM

## 2018-03-17 NOTE — Progress Notes (Signed)
Progress Note  Patient Name: [name] Date of Encounter: 03/17/2018  Primary Cardiologist: No primary care provider on file.   Subjective   Pt seen in stress lab. No chest pain or shortness of breath.   Inpatient Medications    Scheduled Meds: . aspirin EC  81 mg Oral QHS  . enoxaparin (LOVENOX) injection  60 mg Subcutaneous Q24H  . furosemide  40 mg Intravenous Q12H  . losartan  25 mg Oral Daily  . metoprolol tartrate  50 mg Oral QHS  . pravastatin  40 mg Oral QHS  . sertraline  50 mg Oral Daily   Continuous Infusions:  PRN Meds: acetaminophen   Vital Signs    Vitals:   03/17/18 1036 03/17/18 1049 03/17/18 1051 03/17/18 1053  BP: 136/70 (!) 141/88 125/81 124/76  Pulse:      Resp:      Temp:      TempSrc:      SpO2:      Weight:      Height:        Intake/Output Summary (Last 24 hours) at 03/17/2018 1108 Last data filed at 03/17/2018 0510 Gross per 24 hour  Intake 0 ml  Output 1475 ml  Net -1475 ml   Last 3 Weights 03/17/2018 03/16/2018 03/15/2018  Weight (lbs) 289 lb 3.2 oz 292 lb 1.6 oz 291 lb  Weight (kg) 131.18 kg 132.496 kg 131.997 kg      Telemetry    Sinus rhythm while in stress lab - Personally Reviewed  ECG    - Personally Reviewed  Physical Exam   GEN: No acute distress.   Neck: No JVD Cardiac: RRR, no murmurs, rubs, or gallops.  Respiratory: Clear to auscultation bilaterally, scattered rhonchi, cleared with cough GI: Soft, nontender, non-distended  MS: trace ankle edema; No deformity. Neuro:  Nonfocal  Psych: Normal affect   Labs    Chemistry Recent Labs  Lab 03/12/18 1452 03/15/18 0055 03/16/18 0556 03/17/18 0547  NA 140 139 139 140  K 4.3 4.0 4.1 3.8  CL 105 107 105 104  CO2 19* 25 25 27   GLUCOSE 92 123* 112* 113*  BUN 20 18 16 22   CREATININE 1.09 0.97 0.91 0.98  CALCIUM 9.2 8.8* 8.9 9.0  PROT 6.8 7.3 7.3  --   ALBUMIN 3.4* 3.3* 3.2*  --   AST 48* 26 22  --   ALT 20 20 17   --   ALKPHOS 69 61 50  --     BILITOT 0.4 0.6 0.6  --   GFRNONAA 64 >60 >60 >60  GFRAA 74 >60 >60 >60  ANIONGAP  --  7 9 9      Hematology Recent Labs  Lab 03/12/18 1452 03/15/18 0055 03/16/18 0556  WBC 11.7* 9.8 9.2  RBC 4.88 4.57 4.86  HGB 14.3 13.4 13.9  HCT 42.8 42.1 44.4  MCV 88 92.1 91.4  MCH 29.3 29.3 28.6  MCHC 33.4 31.8 31.3  RDW 13.2 13.4 13.3  PLT 247 256 301    Cardiac Enzymes Recent Labs  Lab 03/15/18 0814 03/15/18 1356 03/15/18 2002  TROPONINI 0.10* 0.09* 0.07*    Recent Labs  Lab 03/15/18 0128  TROPIPOC 0.14*     BNP Recent Labs  Lab 03/12/18 1452 03/15/18 0055  BNP 82.1 76.2     DDimer No results for input(s): DDIMER in the last 168 hours.   Radiology    No results found.  Cardiac Studies   Lexiscan myoview in process  2D echo 03/2018 IMPRESSIONS 1. The left ventricle has normal systolic function of 93-23%. The cavity size was normal. There is mildly increased left ventricular wall thickness. Echo evidence of impaired diastolic relaxation. 2. No evidence of left ventricular regional wall motion abnormalities. 3. The right ventricle has normal systolic function. The cavity was normal. There is no increase in right ventricular wall thickness. Right ventricular systolic pressure could not be assessed. 4. Left atrial size was mildly dilated. 5. The mitral valve is normal in structure. There is mild calcification. 6. The tricuspid valve is normal in structure. 7. The aortic valve is normal in structure. There is sclerosis without any evidence of stenosis of the aortic valve. 8. There is dilatation of the aortic   Patient Profile     78 y.o. male with a history of obesity, CAD with remote PCI stenting of the LAD in 1996 in the setting of anterior wall MI hypertension and hyperlipidemia. His last myocardial perfusion scan showed no ischemia in 2011. He was just seen in the cardiology office by Dr. Gwenlyn Found 2 days PTA with complaints of shortness of breath as  well as intermittent chest pain and plan was to get a 2D echo and pharmacologic Myoview stress test. Apparently he has been less mobile over the past few weeks due to shortness of breath and can barely walk in the house. He has also been complaining of intermittent chest pressure and lower extremity edema as well as abdominal distention and has gained 30 pounds in the past year. His family says that he is eating a lot of processed foods and lives by himself and is not compliant with a low-sodium diet.  Assessment & Plan    1.Dyspnea on exertion -Etiology unclear but suspect volume overload -His last 2D echo was done in 2011 showing normal LV function with EF 60 to 65% and mild MR -2Decho this admission showed normal LVF and diastolic dysfunction and no focal wall motion abnormalities. -BNP is normal but this may be falsely low in the setting of obesity -Troponin is minimally elevated with flat trend (0.1>0.09>0.07). -Having 2nd day of 2 day lexiscan myoveiw today -Continue full dose Lovenox, ASA,beta-blocker and statin.  2. ASCAD -Status post remote anterior wall MI with PCI of the LAD in 1996 with no ischemia on Myoview in 2011 -Continue aspirin, statin and beta-blocker  3. Hyperlipidemia -LDL goal is<70 -LDL was 49 on 03/12/2018 -Continue simvastatin  4. COPD -Chest x-ray showed no active disease  5. Acute CHF -LVF has been normal in the past -he had significant LE edema on admit but cxray and BNP are normal -BNP may be falsely low in setting of obesity  -currently on Lasix 18m IV BID -1.4L UOP yesterday. Wt down 3 lbs from yesterday.  -still has mild LE edema so will continue Lasix IV one more day -creatinine stable at 0.98 and K+ 3.8 -follow I&Os and daily weights  6.  HTN -BP elevated -On home meds Losartan 237mdaily and increased back to lopressor tartrate 5017mhs     For questions or updates, please contact [facility] consult [email or web address]  for contact info under        Signed, JanDaune PerchP  03/17/2018, 11:08 AM

## 2018-03-17 NOTE — Progress Notes (Signed)
PROGRESS NOTE  [name]  ZOX:[id number] DOB: [birth year] DOA: 03/14/2018 PCP: [name], MD  Outpatient Specialists: GI, Dr. Gala Romney; Cardiology, Dr. Gwenlyn Found Brief Narrative: [name] is a 78 y.o. male with a history of CAD s/p PCI, obesity, proctocolitis, HTN, and HLD who presented to the hospital with a month of progressive dyspnea on exertion and associated chest tightness associated with decreased mobility and leg swelling and 30lbs weight gain over past 12 months. He was hypoxic. Troponin found to be mildly elevated and felt to be volume overloaded. Cardiology was consulted, IV lasix given and stress testing was recommended.  Assessment & Plan: Active Problems:   Exertional dyspnea   Dyspnea  Acute hypoxic respiratory failure: Due to acute on chronic diastolic CHF. Improved volume status, remains hypoxic at rest on recheck today. - Continue IV lasix again today, down 3lbs. Cr stable.  - Wean to room air as able.  - Monitor weight, I/O, BMP in AM  Chest tightness, troponin elevation, history of CAD with PCI: Low risk stress testing resulted 2/10.  - Continue metoprolol, ASA, statin  Morbid obesity: BMI 38.  - Weight loss recommended  Proctocolitis: Treatment, per records, complicated by limited adherence and financial constraints/insurance limitations. - Last medication recommended in Sept by Roseanne Kaufman was balsalazide. Will discuss with GI team in [place] 2/11 to guide Tx with care management assistance while inpatient.  - CM consulted for insurance assistance and to arrange home health assistance at discharge.  HTN:  - Continue metoprolol, BPs up, no LV systolic dysfunction, restart norvasc  HLD:  - Continue statin  Depression:  - Continue zoloft  Urinary frequency: No significant postvoid residual found on bladder scan.   Diverticulosis: Noted, no abdominal pain or overt GI bleeding  DVT prophylaxis: Lovenox Code Status: Full Family Communication:  Daughter, SIL at bedside Disposition Plan: Home once improved.  Consultants:   Cardiology, Dr. Radford Pax  Procedures:   Stress test 2/9-2/10:  There was no ST segment deviation noted during stress.  Nuclear stress EF: 58%. No wall motion abnormalities  This is a low risk study. No ischemia, no infarct. Prior LAD stent.  Antimicrobials:  None   Subjective: Feels weak after stress testing, having stable but severe diarrhea. No chest pain, feels shortness of breath more than baseline but improved from yesterday.  Objective: Vitals:   03/17/18 1049 03/17/18 1051 03/17/18 1053 03/17/18 1637  BP: (!) 141/88 125/81 124/76 (!) 144/60  Pulse:    66  Resp:    20  Temp:    98 F (36.7 C)  TempSrc:    Oral  SpO2:    95%  Weight:      Height:        Intake/Output Summary (Last 24 hours) at 03/17/2018 1658 Last data filed at 03/17/2018 0935 Gross per 24 hour  Intake 0 ml  Output 2050 ml  Net -2050 ml   Filed Weights   03/15/18 1415 03/16/18 0544 03/17/18 0504  Weight: 132 kg 132.5 kg 131.2 kg   Gen: Obese elderly male in no distress Pulm: Nonlabored breathing room air. Distant with crackles. CV: Regular rate and rhythm. No murmur, rub, or gallop. No JVD, 1+ dependent edema. GI: Abdomen soft, non-tender, non-distended, with normoactive bowel sounds.  Ext: Warm, no deformities Skin: No rashes, lesions or ulcers on visualized skin. Neuro: Alert and oriented. No focal neurological deficits. Psych: Judgement and insight appear fair. Mood euthymic & affect congruent. Behavior is appropriate.    Data  Reviewed: I have personally reviewed following labs and imaging studies  CBC: Recent Labs  Lab 03/12/18 1452 03/15/18 0055 03/16/18 0556  WBC 11.7* 9.8 9.2  NEUTROABS 6.5 5.1  --   HGB 14.3 13.4 13.9  HCT 42.8 42.1 44.4  MCV 88 92.1 91.4  PLT 247 256 111   Basic Metabolic Panel: Recent Labs  Lab 03/12/18 1452 03/15/18 0055 03/16/18 0556 03/17/18 0547  NA 140 139 139  140  K 4.3 4.0 4.1 3.8  CL 105 107 105 104  CO2 19* 25 25 27   GLUCOSE 92 123* 112* 113*  BUN 20 18 16 22   CREATININE 1.09 0.97 0.91 0.98  CALCIUM 9.2 8.8* 8.9 9.0   GFR: Estimated Creatinine Clearance: 86.8 mL/min (by C-G formula based on SCr of 0.98 mg/dL). Liver Function Tests: Recent Labs  Lab 03/12/18 1452 03/15/18 0055 03/16/18 0556  AST 48* 26 22  ALT 20 20 17   ALKPHOS 69 61 50  BILITOT 0.4 0.6 0.6  PROT 6.8 7.3 7.3  ALBUMIN 3.4* 3.3* 3.2*   No results for input(s): LIPASE, AMYLASE in the last 168 hours. No results for input(s): AMMONIA in the last 168 hours. Coagulation Profile: No results for input(s): INR, PROTIME in the last 168 hours. Cardiac Enzymes: Recent Labs  Lab 03/15/18 0814 03/15/18 1356 03/15/18 2002  TROPONINI 0.10* 0.09* 0.07*   BNP (last 3 results) No results for input(s): PROBNP in the last 8760 hours. HbA1C: No results for input(s): HGBA1C in the last 72 hours. CBG: No results for input(s): GLUCAP in the last 168 hours. Lipid Profile: No results for input(s): CHOL, HDL, LDLCALC, TRIG, CHOLHDL, LDLDIRECT in the last 72 hours. Thyroid Function Tests: No results for input(s): TSH, T4TOTAL, FREET4, T3FREE, THYROIDAB in the last 72 hours. Anemia Panel: No results for input(s): VITAMINB12, FOLATE, FERRITIN, TIBC, IRON, RETICCTPCT in the last 72 hours. Urine analysis:    Component Value Date/Time   COLORURINE YELLOW 08/04/2014 1620   APPEARANCEUR CLEAR 08/04/2014 1620   LABSPEC 1.020 08/04/2014 1620   PHURINE 6.0 08/04/2014 1620   GLUCOSEU NEGATIVE 08/04/2014 1620   HGBUR SMALL (A) 08/04/2014 1620   BILIRUBINUR NEGATIVE 08/04/2014 1620   BILIRUBINUR ++ 11/18/2012 1319   KETONESUR NEGATIVE 08/04/2014 1620   PROTEINUR NEGATIVE 08/04/2014 1620   UROBILINOGEN 0.2 08/04/2014 1620   NITRITE NEGATIVE 08/04/2014 1620   LEUKOCYTESUR NEGATIVE 08/04/2014 1620   No results found for this or any previous visit (from the past 240 hour(s)).     Radiology Studies: Nm Myocar Multi W/spect W/wall Motion / Ef  Result Date: 03/17/2018  There was no ST segment deviation noted during stress.  Nuclear stress EF: 58%. No wall motion abnormalities  This is a low risk study. No ischemia, no infarct. Prior LAD stent.  Candee Furbish, MD    Scheduled Meds: . aspirin EC  81 mg Oral QHS  . enoxaparin (LOVENOX) injection  60 mg Subcutaneous Q24H  . furosemide  40 mg Intravenous Q12H  . losartan  25 mg Oral Daily  . metoprolol tartrate  50 mg Oral QHS  . pravastatin  40 mg Oral QHS  . sertraline  50 mg Oral Daily   Continuous Infusions:   LOS: 1 day   Time spent: 25 minutes.  Patrecia Pour, MD [facility] [email or web address] Password [facility] 03/17/2018, 4:58 PM

## 2018-03-17 NOTE — Progress Notes (Signed)
    Patient presented for Lexiscan nuclear stress test. Tolerated procedure well. Pending final stress imaging result.  Daune Perch, AGNP-C 03/17/2018  11:15 AM Pager: [phone]

## 2018-03-17 NOTE — Evaluation (Signed)
Occupational Therapy Evaluation Patient Details Name: [name] MRN: [id number] DOB: 02/06/1940 Today's Date: 03/17/2018    History of Present Illness 78 y.o. male with a history of CAD s/p PCI, obesity, proctocolitis, HTN, and HLD who presented to the hospital with a month of progressive dyspnea on exertion, associated chest tightness associated with decreased mobility and leg swelling. Pt with mildly elevated troponin and cardiology consulted.    Clinical Impression   This 78 y/o male presents with the above. PTA pt reports he is independent with ADL and mobility, though reports increased effort/difficulty with ADL tasks most recently; lives alone. Pt presents supine in bed pleasant and willing to participate in therapy. Pt completing room level mobility using RW with overall minA this session; he currently requires setup-minguard assist for seated UB ADL, maxA for LB ADL. Pt mostly limited due to decreased activity tolerance/endurance. He will benefit from continued acute OT services and recommend post acute therapy services after discharge to maximize his safety and independence with ADL and mobility. Will follow.     Follow Up Recommendations  Home health OT;Supervision/Assistance - 24 hour(24hr initially; may benefit from [facility])    Equipment Recommendations  3 in 1 bedside commode;Tub/shower bench(will require bariatric)           Precautions / Restrictions Precautions Precautions: Fall Precaution Comments: pt with hx of falls, reports approx 4 in the past 52month Restrictions Weight Bearing Restrictions: No      Mobility Bed Mobility Overal bed mobility: Needs Assistance Bed Mobility: Supine to Sit;Sit to Supine     Supine to sit: Mod assist Sit to supine: Min assist   General bed mobility comments: heavy reliance on trapeze bar and additional assist for trunk elevation; assist for LEs onto EOB when returning to supine  Transfers Overall transfer level: Needs  assistance Equipment used: Rolling walker (2 wheeled) Transfers: Sit to/from Stand Sit to Stand: Min assist;Mod assist         General transfer comment: boosting assist to RW with pt using momentum for additional assist, VCs hand placement    Balance                                           ADL either performed or assessed with clinical judgement   ADL Overall ADL's : Needs assistance/impaired Eating/Feeding: Modified independent;Sitting   Grooming: Set up;Sitting   Upper Body Bathing: Min guard;Sitting   Lower Body Bathing: Minimal assistance;Sit to/from stand   Upper Body Dressing : Set up;Sitting   Lower Body Dressing: Moderate assistance;Sit to/from stand Lower Body Dressing Details (indicate cue type and reason): minA standing balance Toilet Transfer: Minimal assistance;Ambulation;RW Toilet Transfer Details (indicate cue type and reason): simulated via transfer to/from EOB Toileting- Clothing Manipulation and Hygiene: Minimal assistance;Sit to/from stand       Functional mobility during ADLs: Minimal assistance;Rolling walker       Vision         Perception     Praxis      Pertinent Vitals/Pain Pain Assessment: Faces Faces Pain Scale: Hurts little more Pain Location: chest Pain Descriptors / Indicators: Guarding;Tightness Pain Intervention(s): Monitored during session;Limited activity within patient's tolerance     Hand Dominance Right   Extremity/Trunk Assessment Upper Extremity Assessment Upper Extremity Assessment: Overall WFL for tasks assessed   Lower Extremity Assessment Lower Extremity Assessment: Defer to PT evaluation  Communication Communication Communication: HOH   Cognition Arousal/Alertness: Awake/alert Behavior During Therapy: WFL for tasks assessed/performed Overall Cognitive Status: Within Functional Limits for tasks assessed                                     General Comments        Exercises     Shoulder Instructions      Home Living Family/patient expects to be discharged to:: Private residence Living Arrangements: Alone Available Help at Discharge: Family;Available PRN/intermittently Type of Home: House Home Access: Stairs to enter [employer] of Steps: 2 Entrance Stairs-Rails: Left Home Layout: One level     Bathroom Shower/Tub: Teacher, early years/pre: Handicapped height     Home Equipment: Environmental consultant - 4 wheels          Prior Functioning/Environment Level of Independence: Independent        Comments: reports increased effort with ADL recently        OT Problem List: Decreased strength;Decreased range of motion;Decreased activity tolerance;Impaired balance (sitting and/or standing);Decreased knowledge of use of DME or AE;Pain;Obesity      OT Treatment/Interventions: Self-care/ADL training;Therapeutic exercise;DME and/or AE instruction;Therapeutic activities;Patient/family education;Balance training    OT Goals(Current goals can be found in the care plan section) Acute Rehab OT Goals Patient Stated Goal: regain PLOF OT Goal Formulation: With patient Time For Goal Achievement: 03/31/18 Potential to Achieve Goals: Good  OT Frequency: Min 2X/week   Barriers to D/C:            Co-evaluation              AM-PAC OT "6 Clicks" Daily Activity     Outcome Measure Help from another person eating meals?: None Help from another person taking care of personal grooming?: A Little Help from another person toileting, which includes using toliet, bedpan, or urinal?: A Lot Help from another person bathing (including washing, rinsing, drying)?: A Little Help from another person to put on and taking off regular upper body clothing?: A Little Help from another person to put on and taking off regular lower body clothing?: A Lot 6 Click Score: 17   End of Session Equipment Utilized During Treatment: Rolling walker Nurse  Communication: Mobility status  Activity Tolerance: Patient tolerated treatment well Patient left: in bed;with call bell/phone within reach;with bed alarm set;with family/visitor present  OT Visit Diagnosis: Muscle weakness (generalized) (M62.81);History of falling (Z91.81)                Time: 5366-4403 OT Time Calculation (min): 20 min Charges:  OT General Charges $OT Visit: 1 Visit OT Evaluation $OT Eval Moderate Complexity: 1 Mod  [name], OT [employer] Pager [phone] Office [phone]   [name] 03/17/2018, 4:23 PM

## 2018-03-18 ENCOUNTER — Telehealth: Payer: Self-pay | Admitting: Gastroenterology

## 2018-03-18 ENCOUNTER — Inpatient Hospital Stay (HOSPITAL_COMMUNITY): Admission: RE | Admit: 2018-03-18 | Payer: Medicare HMO | Source: Ambulatory Visit

## 2018-03-18 ENCOUNTER — Other Ambulatory Visit: Payer: Self-pay | Admitting: Physician Assistant

## 2018-03-18 ENCOUNTER — Telehealth: Payer: Self-pay | Admitting: Family Medicine

## 2018-03-18 DIAGNOSIS — I5031 Acute diastolic (congestive) heart failure: Secondary | ICD-10-CM

## 2018-03-18 DIAGNOSIS — I2510 Atherosclerotic heart disease of native coronary artery without angina pectoris: Secondary | ICD-10-CM

## 2018-03-18 LAB — BASIC METABOLIC PANEL
Anion gap: 10 (ref 5–15)
BUN: 27 mg/dL — ABNORMAL HIGH (ref 8–23)
CO2: 25 mmol/L (ref 22–32)
Calcium: 8.9 mg/dL (ref 8.9–10.3)
Chloride: 103 mmol/L (ref 98–111)
Creatinine, Ser: 1.13 mg/dL (ref 0.61–1.24)
GFR calc Af Amer: >60 mL/min (ref >60)
GFR calc non Af Amer: >60 mL/min (ref >60)
Glucose, Bld: 147 mg/dL — ABNORMAL HIGH (ref 70–99)
Potassium: 3.8 mmol/L (ref 3.5–5.1)
Sodium: 138 mmol/L (ref 135–145)

## 2018-03-18 MED ORDER — FUROSEMIDE 40 MG PO TABS: 40.0000 mg | ORAL_TABLET | Freq: Every day | ORAL | Status: DC

## 2018-03-18 MED ORDER — FUROSEMIDE 40 MG PO TABS: 40.0000 mg | ORAL_TABLET | Freq: Every day | ORAL | 0 refills | Status: DC

## 2018-03-18 MED ORDER — ASPIRIN 81 MG PO TABS: 81.0000 mg | ORAL_TABLET | Freq: Every day | ORAL | Status: DC

## 2018-03-18 MED ORDER — BALSALAZIDE DISODIUM 750 MG PO CAPS: 2250.0000 mg | ORAL_CAPSULE | Freq: Three times a day (TID) | ORAL | 2 refills | Status: DC

## 2018-03-18 NOTE — Progress Notes (Signed)
Pt requested to back to bed after breakfast. O2 sats maintained above 92% on room air while transferring back to bed but decreased after patient laid back down. Pt usually wears CPAP while in bed, however does not want to wear at this time and is requesting HOB < 20 degrees. O2 via [place] at 2 LPM replaced. Pt's O2 sats above 92% at this time.

## 2018-03-18 NOTE — Progress Notes (Addendum)
Progress Note  Patient Name: [name] Date of Encounter: 03/18/2018  Primary Cardiologist: No primary care provider on file.   Subjective   Shortness of breath much improved and he denies any chest pain  Inpatient Medications    Scheduled Meds: . amLODipine  2.5 mg Oral Daily  . aspirin EC  81 mg Oral QHS  . enoxaparin (LOVENOX) injection  60 mg Subcutaneous Q24H  . furosemide  40 mg Intravenous Q12H  . losartan  25 mg Oral Daily  . metoprolol tartrate  50 mg Oral QHS  . pravastatin  40 mg Oral QHS  . sertraline  50 mg Oral Daily   Continuous Infusions:  PRN Meds: acetaminophen   Vital Signs    Vitals:   03/17/18 2200 03/17/18 2203 03/18/18 0558 03/18/18 0750  BP: (!) 152/56 (!) 152/56 (!) 146/63   Pulse: 67 67 62   Resp:  16 18   Temp:  98 F (36.7 C) 97.7 F (36.5 C)   TempSrc:  Oral Oral   SpO2:  93% 90% 94%  Weight:   131.6 kg   Height:        Intake/Output Summary (Last 24 hours) at 03/18/2018 1321 Last data filed at 03/18/2018 1136 Gross per 24 hour  Intake 480 ml  Output 500 ml  Net -20 ml   Last 3 Weights 03/18/2018 03/17/2018 03/16/2018  Weight (lbs) 290 lb 3.2 oz 289 lb 3.2 oz 292 lb 1.6 oz  Weight (kg) 131.634 kg 131.18 kg 132.496 kg      Telemetry    Normal sinus rhythm- Personally Reviewed  ECG    No new EKG to review- Personally Reviewed  Physical Exam   GEN: Well nourished, well developed in no acute distress HEENT: Normal NECK: No JVD; No carotid bruits LYMPHATICS: No lymphadenopathy CARDIAC:RRR, no murmurs, rubs, gallops RESPIRATORY: Coarse rhonchi in upper airway that clears with cough, no crackles ABDOMEN: Soft, non-tender, non-distended MUSCULOSKELETAL: Trace lower extremity edema; No deformity  SKIN: Warm and dry NEUROLOGIC:  Alert and oriented x 3 PSYCHIATRIC:  Normal affect    Labs    Chemistry Recent Labs  Lab 03/12/18 1452  03/15/18 0055 03/16/18 0556 03/17/18 0547 03/18/18 0859  NA 140   < > 139  139 140 138  K 4.3  --  4.0 4.1 3.8 3.8  CL 105  --  107 105 104 103  CO2 19*  --  25 25 27 25   GLUCOSE 92   < > 123* 112* 113* 147*  BUN 20   < > 18 16 22  27*  CREATININE 1.09  --  0.97 0.91 0.98 1.13  CALCIUM 9.2  --  8.8* 8.9 9.0 8.9  PROT 6.8  --  7.3 7.3  --   --   ALBUMIN 3.4*  --  3.3* 3.2*  --   --   AST 48*  --  26 22  --   --   ALT 20  --  20 17  --   --   ALKPHOS 69  --  61 50  --   --   BILITOT 0.4  --  0.6 0.6  --   --   GFRNONAA 64  --  >60 >60 >60 >60  GFRAA 74  --  >60 >60 >60 >60  ANIONGAP  --    < > 7 9 9 10    < > = values in this interval not displayed.     Hematology Recent Labs  Lab 03/12/18 1452 03/15/18 0055 03/16/18 0556  WBC 11.7* 9.8 9.2  RBC 4.88 4.57 4.86  HGB 14.3 13.4 13.9  HCT 42.8 42.1 44.4  MCV 88 92.1 91.4  MCH 29.3 29.3 28.6  MCHC 33.4 31.8 31.3  RDW 13.2 13.4 13.3  PLT 247 256 301    Cardiac Enzymes Recent Labs  Lab 03/15/18 0814 03/15/18 1356 03/15/18 2002  TROPONINI 0.10* 0.09* 0.07*    Recent Labs  Lab 03/15/18 0128  TROPIPOC 0.14*     BNP Recent Labs  Lab 03/12/18 1452 03/15/18 0055  BNP 82.1 76.2     DDimer No results for input(s): DDIMER in the last 168 hours.   Radiology    Nm Myocar Multi W/spect W/wall Motion / Ef  Result Date: 03/17/2018  There was no ST segment deviation noted during stress.  Nuclear stress EF: 58%. No wall motion abnormalities  This is a low risk study. No ischemia, no infarct. Prior LAD stent.  Candee Furbish, MD    Cardiac Studies   Leane Call in process   2D echo 03/2018 IMPRESSIONS 1. The left ventricle has normal systolic function of 44-01%. The cavity size was normal. There is mildly increased left ventricular wall thickness. Echo evidence of impaired diastolic relaxation. 2. No evidence of left ventricular regional wall motion abnormalities. 3. The right ventricle has normal systolic function. The cavity was normal. There is no increase in right ventricular  wall thickness. Right ventricular systolic pressure could not be assessed. 4. Left atrial size was mildly dilated. 5. The mitral valve is normal in structure. There is mild calcification. 6. The tricuspid valve is normal in structure. 7. The aortic valve is normal in structure. There is sclerosis without any evidence of stenosis of the aortic valve. 8. There is dilatation of the aortic   Nuclear stress test 03/17/2018 Study Result     There was no ST segment deviation noted during stress.  Nuclear stress EF: 58%. No wall motion abnormalities  This is a low risk study. No ischemia, no infarct. Prior LAD stent.      Patient Profile     78 y.o. male with a history of obesity, CAD with remote PCI stenting of the LAD in 1996 in the setting of anterior wall MI hypertension and hyperlipidemia. His last myocardial perfusion scan showed no ischemia in 2011. He was just seen in the cardiology office by Dr. Gwenlyn Found 2 days PTA with complaints of shortness of breath as well as intermittent chest pain and plan was to get a 2D echo and pharmacologic Myoview stress test. Apparently he has been less mobile over the past few weeks due to shortness of breath and can barely walk in the house. He has also been complaining of intermittent chest pressure and lower extremity edema as well as abdominal distention and has gained 30 pounds in the past year. His family says that he is eating a lot of processed foods and lives by himself and is not compliant with a low-sodium diet.  Assessment & Plan    1.Dyspnea on exertion -Etiology unclear but suspect volume overload -His last 2D echo was done in 2011 showing normal LV function with EF 60 to 65% and mild MR -2Decho this admission showed normal LVF and diastolic dysfunction and no focal wall motion abnormalities. -BNP is normal but this may be falsely low in the setting of obesity -Troponin is minimally elevated with flat trend  (0.1>0.09>0.07). -Lexiscan Myoview done yesterday was low risk with no  evidence of ischemia -Continue ASA,beta-blocker and statin.  2. ASCAD -Status post remote anterior wall MI with PCI of the LAD in 1996 with no ischemia on Myoview in 2011 -No ischemia on repeat Myoview yesterday -Continue aspirin, statin and beta-blocker  3. Hyperlipidemia -LDL goal is<70 -LDL was 49 on 03/12/2018 -Continue simvastatin  4. COPD -Chest x-ray showed no active disease  5. Acute CHF -LVF has been normal in the past -he had significant LE edema on admit but cxray and BNP are normal -BNP may be falsely low in setting of obesity  -currently on Lasix 66m IV BID -He put out 575 cc yesterday and is net -5 L -Lungs are clear with after cough and only has trace edema in legs now -Weight really unchanged since admission and question accuracy -creatinine has bumped from 0.98-1.13 today -Change Lasix to 40 mg p.o. daily -Discussed at length the need to follow a 2 g sodium diet with no added salt in his diet  6.  HTN -BP borderline controlled ranging anywhere from 146/63-152/56 mmHg -Continue on Losartan 233mdaily, amlodipine 2.60m52maily, metoprolol tartrate 68m760ms -Restart home dose of doxazosin 4 mg nightly   [facility] will sign off.   Medication Recommendations: Amlodipine 2.5 mg daily, aspirin 81 mg daily, doxazosin 4 mg nightly, Lasix 40 mg daily, Cozaar 25 mg daily, Lopressor 50 mg nightly, pravastatin 40 mg daily Other recommendations (labs, testing, etc): Bmet in 1 week Follow up as an outpatient: Office visit with extender in our office in 1 to 2 weeks     For questions or updates, please contact [facility] consult [email or web address] for contact info under        Signed, TracFransico Him  03/18/2018, 1:21 PM

## 2018-03-18 NOTE — Telephone Encounter (Signed)
Left a message for pts daughter to return my call.

## 2018-03-18 NOTE — Telephone Encounter (Signed)
Spoke with Dr. Bonner Puna, hospitalist, who is discharging patient today from hospitalization in [place]. Patient has not been on Colazal as recommended. Appears there is a PA that is needed. I am sending this in again. Unfortunately, other agents have been too expensive out of pocket. He has been on Colazal historically.    Please call patient on 2/12 and let him know we will need to work on PA for this. I sent in again. We have had limited options for therapy due to medication cost. If he needs another round of entocort, we will have to have this compounded at [place] with self-pay option. Keep upcoming March 2020 appointment.

## 2018-03-18 NOTE — Care Management Note (Signed)
Case Management Note  Patient Details  Name: DENT PLANTZ MRN: [id number] Date of Birth: February 04, 1941  Subjective/Objective:                    Action/Plan:   Expected Discharge Date:                  Expected Discharge Plan:     In-House Referral:     Discharge planning Services     Post Acute Care Choice:    Choice offered to:     DME Arranged:    DME Agency:     HH Arranged:    HH Agency:     Status of Service:     If discussed at [employer] of Stay Meetings, dates discussed:    Additional Comments:    Kerin Salen 03/18/2018, 12:08 PM

## 2018-03-18 NOTE — Telephone Encounter (Signed)
Nurse's-patient recently discharged from the hospital. Please call patient, let them know that we are aware that they were discharged from the hospital. Please schedule them to follow-up with [place] within the next 7 days. Advised the patient to bring all of their medications with him to the visit. Please inquire if they are having any acute issues currently and documented accordingly.  Nurses please connect with the patient and/or his daughter patient was just released from the hospital was recommended to do a follow-up visit within 1 to 2 weeks I would recommend a follow-up office visit next week

## 2018-03-18 NOTE — Progress Notes (Signed)
Pt discharged home today per Dr. Bonner Puna. Pt's IV site D/C'd and WDL. Pt's VSS. Pt provided with home medication list, discharge instructions and prescriptions. Verbalized understanding. Pt left floor via WC in stable condition accompanied by NT.

## 2018-03-18 NOTE — Telephone Encounter (Signed)
PA was submitted through [email or web address]. waiting on an approval or denial.

## 2018-03-18 NOTE — Progress Notes (Signed)
Pt's oxygen level remained 90-95% on room while transferring from bed to chair. No c/o SOB noted.

## 2018-03-18 NOTE — Progress Notes (Signed)
PT Cancellation Note  Patient Details Name: [name] MRN: [id number] DOB: 17-Feb-1940   Cancelled Treatment:    Reason Eval/Treat Not Completed: PT screened, no needs identified, will sign off RN reports pt discharging home today and has been up ambulating with RW and doing well.  Plans per chart to f/u with HHPT.  Will defer PT needs to home health.  Thank you.   Joie Hipps,KATHrine E 03/18/2018, 3:24 PM Carmelia Bake, PT, DPT Acute Rehabilitation Services Office: [phone] Pager: [phone]

## 2018-03-18 NOTE — Progress Notes (Addendum)
Occupational Therapy Treatment Patient Details Name: [name] MRN: [id number] DOB: 06/29/1940 Today's Date: 03/18/2018    History of present illness 78 y.o. male with a history of CAD s/p PCI, obesity, proctocolitis, HTN, and HLD who presented to the hospital with a month of progressive dyspnea on exertion, associated chest tightness associated with decreased mobility and leg swelling. Pt with mildly elevated troponin and cardiology consulted.    OT comments  Pt with increased toileting needs today and requiring increased time on BSC. Assisted pt with peri-care and transfer to recliner (NT present to assist as well) and pt requiring transfer back to [facility] end of session due to need to have another BM. Pt completing mobility in room using RW at minguard assist level today; required modA for toileting and completion of peri-care after BM. O2 sats on RA >90% with room level activity. Will continue per POC.   Follow Up Recommendations  Home health OT;Supervision/Assistance - 24 hour(24hr initially; may benefit from [facility])    Equipment Recommendations  3 in 1 bedside commode;Tub/shower bench(will require bariatric)          Precautions / Restrictions Precautions Precautions: Fall Precaution Comments: pt with hx of falls, reports approx 4 in the past 26month Restrictions Weight Bearing Restrictions: No       Mobility Bed Mobility               General bed mobility comments: OOB upon arrival  Transfers Overall transfer level: Needs assistance Equipment used: Rolling walker (2 wheeled) Transfers: Sit to/from Stand Sit to Stand: Min guard         General transfer comment: VCs for safe hand placement, minguard for safety and balance    Balance                                           ADL either performed or assessed with clinical judgement   ADL       Grooming: Wash/dry face;Set up;Sitting                   Toilet Transfer: Min  guard;Ambulation;BSC;RW Toilet Transfer Details (indicate cue type and reason): BSC in room Toileting- Clothing Manipulation and Hygiene: Moderate assistance;Sit to/from stand Toileting - Clothing Manipulation Details (indicate cue type and reason): assist for peri-care after BM     Functional mobility during ADLs: Rolling walker;Min guard General ADL Comments: pt limited due to urgency and consistent need to have BM today; seated on BSC initially start of session and then [facility] for further toileting      Vision       Perception     Praxis      Cognition Arousal/Alertness: Awake/alert Behavior During Therapy: WFL for tasks assessed/performed Overall Cognitive Status: Within Functional Limits for tasks assessed                                          Exercises     Shoulder Instructions       General Comments pt on RA with SpO2 maintaining >90% with room level activity    Pertinent Vitals/ Pain       Pain Assessment: Faces Faces Pain Scale: Hurts little more Pain Location: abdominal Pain Descriptors / Indicators: Discomfort;Sore  Pain Intervention(s): Monitored during session  Home Living                                          Prior Functioning/Environment              Frequency  Min 2X/week        Progress Toward Goals  OT Goals(current goals can now be found in the care plan section)  Progress towards OT goals: Progressing toward goals  Acute Rehab OT Goals Patient Stated Goal: regain PLOF OT Goal Formulation: With patient Time For Goal Achievement: 03/31/18 Potential to Achieve Goals: Good  Plan Discharge plan remains appropriate    Co-evaluation                 AM-PAC OT "6 Clicks" Daily Activity     Outcome Measure   Help from another person eating meals?: None Help from another person taking care of personal grooming?: A Little Help from another person  toileting, which includes using toliet, bedpan, or urinal?: A Lot Help from another person bathing (including washing, rinsing, drying)?: A Little Help from another person to put on and taking off regular upper body clothing?: A Little Help from another person to put on and taking off regular lower body clothing?: A Lot 6 Click Score: 17    End of Session Equipment Utilized During Treatment: Rolling walker  OT Visit Diagnosis: Muscle weakness (generalized) (M62.81);History of falling (Z91.81)   Activity Tolerance Patient tolerated treatment well;Other (comment)(limited due to increased time needed for toileting)   Patient Left with call bell/phone within reach;Other (comment)(seated on [facility])   Nurse Communication Mobility status        Time: 1497-0263 OT Time Calculation (min): 14 min  Charges: OT General Charges $OT Visit: 1 Visit OT Treatments $Self Care/Home Management : 8-22 mins  Lou Cal, OT Supplemental Rehabilitation Services Pager [phone] Office [phone]    [name] 03/18/2018, 1:14 PM

## 2018-03-18 NOTE — Care Management Note (Signed)
Case Management Note  Patient Details  Name: [name] MRN: [id number] Date of Birth: May 17, 1940  Subjective/Objective:                    Action/Plan:   Expected Discharge Date:                  Expected Discharge Plan:     In-House Referral:     Discharge planning Services     Post Acute Care Choice:    Choice offered to:     DME Arranged:    DME Agency:     HH Arranged:    HH Agency:     Status of Service:     If discussed at [employer] of Stay Meetings, dates discussed:    Additional Comments: Per [place] [phone] Colazal requires a prior auth p#[phone] Before an estimated copay can be given  Kerin Salen 03/18/2018, 12:02 PM

## 2018-03-18 NOTE — Care Management Note (Addendum)
Case Management Note  Patient Details  Name: [name] MRN: [id number] Date of Birth: March 02, 1940  Subjective/Objective: Dysjpnea. From home. PT-recc HHPT. Ordered for HHPT/HHOT-Checking for [facility] agency Declined:[facility], [facility], [facility]-accepted-rep [facility] accepted but it was too late. Checked benefit for colazal-MD notifed of prior auth needed tel# provided to attending-[phone]-per attending let the family know to have the [facility] GI doctor provide the prior auth.                  Action/Plan:d/c home w/HHC.   Expected Discharge Date:                  Expected Discharge Plan:  [place]  In-House Referral:     Discharge planning Services  CM Consult  Post Acute Care Choice:    Choice offered to:  Patient  DME Arranged:    DME Agency:     HH Arranged:  PT, OT HH Agency:  [place]  Status of Service:  In process, will continue to follow  If discussed at Long Length of Stay Meetings, dates discussed:    Additional Comments:  Dessa Phi, RN 03/18/2018, 12:15 PM

## 2018-03-18 NOTE — Discharge Summary (Signed)
Physician Discharge Summary  HARON BEILKE IOX:[id number] DOB: 18-Jul-1940 DOA: 03/14/2018  PCP: [name], MD  Admit date: 03/14/2018 Discharge date: 03/18/2018  Admitted From: Home Disposition: Home   Recommendations for Outpatient Follow-up:  1. Follow up with PCP in 1-2 weeks 2. Follow up in cardiology clinic in 1-2 weeks 3. Please obtain BMP in one week 4. Follow up with GI March 3.  Home Health: OT, PT, aide, RN Equipment/Devices: 3 in 1, bariatric tub/shower bench Discharge Condition: Stable CODE STATUS: Full Diet recommendation: Heart healthy  Brief/Interim Summary: [name] is a 78 y.o. male with a history of CAD s/p PCI, obesity, proctocolitis, HTN, and HLD who presented to the hospital with a month of progressive dyspnea on exertion and associated chest tightness associated with decreased mobility and leg swelling and 30lbs weight gain over past 12 months. He was hypoxic. Troponin found to be mildly elevated and felt to be volume overloaded. Cardiology was consulted, IV lasix given and stress testing was recommended This was performed as a two-day test and was low risk with preserved EF. IV lasix continued and the patient ultimately was liberated from oxygen and is stable for discharge.  Discharge Diagnoses:  Active Problems:   Exertional dyspnea   Dyspnea  Acute hypoxic respiratory failure: Due to acute on chronic diastolic CHF. Improved volume status, and no longer hypoxic. - Per cardiology, will continue lasix 45m po daily at discharge - Monitor weights  Chest tightness, troponin elevation, history of CAD with PCI: Low risk stress testing resulted 2/10.  - Continue metoprolol, ASA, statin  Morbid obesity: BMI 38.  - Weight loss recommended  Proctocolitis: Treatment, per records, complicated by limited adherence and financial constraints/insurance limitations. - Symptoms are currently mild-moderate - Last medication recommended in Sept by [facility] balsalazide. Discussed with her on the morning and afternoon of discharge. Patient will follow up shortly there. Colazal was recommended based on insurance coverage, but will require prior authorization. Therefore I have deferred treatment/authorization to the patient's outpatient GI continuity provider. The [facility] from [facility] will be pursued. - CM consulted for insurance assistance and to arrange home health assistance at discharge.  HTN:  - Continue metoprolol, BPs up, no LV systolic dysfunction, restarted norvasc  HLD:  - Continue statin  Depression:  - Continue zoloft  Urinary frequency: No significant postvoid residual found on bladder scan.   Diverticulosis: Noted, no abdominal pain or overt GI bleeding.  Note cardura not continued due to note to "[facility] Pt instructed not to take until he meets with his PCP..."  Discharge Instructions Discharge Instructions    Diet - low sodium heart healthy   Complete by:  As directed    Discharge instructions   Complete by:  As directed    You were admitted for chest tightness and were found to have a low risk stress test, so this is not due to a blockage in your heart arteries. Lasix has been given to decrease fluid in your lungs with improvement in breathing. You are stable for discharge with the following recommendations:  - Continue medications as listed below, including lasix, and follow up with Dr. BGwenlyn Foundin 1-2 weeks for hospital follow up - To help with colitis, a prescription for colazal was sent to the pharmacy by [facility] This will require prior authorization which will be completed by them. You have follow up there on March 3rd.  -  If your chest tightness or trouble breathing worsen, seek medical attention right away.   Increase activity slowly   Complete by:  As directed      Allergies as of 03/18/2018   No Known Allergies     Medication List     STOP taking these medications   azithromycin 250 MG tablet Commonly known as:  ZITHROMAX Z-PAK   doxazosin 4 MG tablet Commonly known as:  CARDURA     TAKE these medications   acetaminophen 325 MG tablet Commonly known as:  TYLENOL Take 650 mg by mouth every 6 (six) hours as needed for moderate pain or headache.   amLODipine 2.5 MG tablet Commonly known as:  NORVASC Take 1 tablet (2.5 mg total) by mouth daily.   aspirin 81 MG tablet Take 1 tablet (81 mg total) by mouth at bedtime. What changed:  how much to take   balsalazide 750 MG capsule Commonly known as:  COLAZAL Take 3 capsules (2,250 mg total) by mouth 3 (three) times daily.   furosemide 40 MG tablet Commonly known as:  LASIX Take 1 tablet (40 mg total) by mouth daily. Start taking on:  March 19, 2018 What changed:    medication strength  how much to take  how to take this  when to take this  additional instructions   losartan 25 MG tablet Commonly known as:  COZAAR TAKE 1 TABLET BY MOUTH ONCE DAILY   metoprolol tartrate 50 MG tablet Commonly known as:  LOPRESSOR One po QHS What changed:    how much to take  how to take this  when to take this   nitroGLYCERIN 0.4 MG SL tablet Commonly known as:  NITROSTAT Place 0.4 mg under the tongue every 5 (five) minutes as needed for chest pain.   OVER THE COUNTER MEDICATION Omega XL 4 tablets at night   pravastatin 40 MG tablet Commonly known as:  PRAVACHOL Take 1 tablet (40 mg total) by mouth at bedtime.   sertraline 50 MG tablet Commonly known as:  ZOLOFT Take 1 tablet (50 mg total) by mouth daily.      Follow-up Information    [facility].   Why:  [facility]occupational therapy/aide Contact information: [place] [address] [phone]        [facility].   Why:  Prior auth needed for Colazal-call#800 [place]       Annitta Needs, NP .   Specialty:   Gastroenterology Contact information: [place] [address] [phone]        Daneil Dolin, MD. Go on 04/08/2018.   Specialty:  Gastroenterology Contact information: [address] [place] [address] [phone]          No Known Allergies  Consultations:  Cardiology  Procedures/Studies: Dg Chest 2 View  Result Date: 03/15/2018 CLINICAL DATA:  Shortness of breath EXAM: CHEST - 2 VIEW COMPARISON:  03/12/2018 FINDINGS: Cardiomegaly. No confluent airspace opacities or effusions. No overt edema. No acute bony abnormality. IMPRESSION: Cardiomegaly.  No active disease. Electronically Signed   By: Rolm Baptise M.D.   On: 03/15/2018 00:35   Dg Chest 2 View  Result Date: 03/12/2018 CLINICAL DATA:  Dizziness, weakness and shortness of breath. EXAM: CHEST - 2 VIEW COMPARISON:  07/15/2017 FINDINGS: The the heart is mildly enlarged. There is tortuosity and calcification of the thoracic aorta. The pulmonary hila appear normal in stable. Emphysematous changes and pulmonary scarring  with hyperinflation. No infiltrates, edema or effusions. The bony thorax is grossly intact. IMPRESSION: Cardiac enlargement, stable. Emphysematous changes and pulmonary scarring but no definite acute overlying pulmonary process. Electronically Signed   By: Marijo Sanes M.D.   On: 03/12/2018 15:55   Ct Angio Chest Pe W And/or Wo Contrast  Result Date: 03/15/2018 CLINICAL DATA:  Shortness of breath and syncopal episodes over the past week. EXAM: CT ANGIOGRAPHY CHEST WITH CONTRAST TECHNIQUE: Multidetector CT imaging of the chest was performed using the standard protocol during bolus administration of intravenous contrast. Multiplanar CT image reconstructions and MIPs were obtained to evaluate the vascular anatomy. CONTRAST:  122m ISOVUE-370 IOPAMIDOL (ISOVUE-370) INJECTION 76% COMPARISON:  Chest CT from 2015. FINDINGS: Cardiovascular: The heart is enlarged but stable when compared to recent chest  x-rays. Progressive since compared to 2015 chest CT. No pericardial effusion. The aorta is normal in caliber. No dissection. Mild-to-moderate atherosclerotic calcifications. There are fairly extensive three-vessel coronary artery calcifications. The pulmonary arterial tree is suboptimally opacified. Moderate artifact related to breathing. No large central pulmonary emboli are identified. Mediastinum/Nodes: Small scattered mediastinal and hilar lymph nodes but no mass or overt adenopathy. The esophagus is grossly normal. Lungs/Pleura: Significant upper lobe predominant emphysematous changes with superimposed interstitial and airspace process, likely pulmonary edema. Very small pleural effusions are noted along with bibasilar atelectasis. No focal airspace consolidation to suggest pneumonia. No worrisome pulmonary lesions. Upper Abdomen: No significant upper abdominal findings. Musculoskeletal: No significant bony findings. Possible changes of ankylosing spondylitis involving the thoracic spine. Review of the MIP images confirms the above findings. IMPRESSION: 1. Limited examination but no large central pulmonary emboli. 2. No aortic aneurysm or dissection. 3. Significant three-vessel coronary artery calcifications. 4. Cardiac enlargement. 5. Emphysematous changes with superimposed pulmonary edema. There also small effusions and bibasilar atelectasis. 6. No worrisome pulmonary lesions. Aortic Atherosclerosis (ICD10-I70.0) and Emphysema (ICD10-J43.9). Electronically Signed   By: PMarijo SanesM.D.   On: 03/15/2018 04:37   Nm Myocar Multi W/spect W/wall Motion / Ef  Result Date: 03/17/2018  There was no ST segment deviation noted during stress.  Nuclear stress EF: 58%. No wall motion abnormalities  This is a low risk study. No ischemia, no infarct. Prior LAD stent.  MCandee Furbish MD    Echocardiogram 03/15/2018 1. The left ventricle has normal systolic function of 663-78% The cavity size was normal. There is  mildly increased left ventricular wall thickness. Echo evidence of impaired diastolic relaxation.  2. No evidence of left ventricular regional wall motion abnormalities.  3. The right ventricle has normal systolic function. The cavity was normal. There is no increase in right ventricular wall thickness. Right ventricular systolic pressure could not be assessed.  4. Left atrial size was mildly dilated.  5. The mitral valve is normal in structure. There is mild calcification.  6. The tricuspid valve is normal in structure.  7. The aortic valve is normal in structure. There is sclerosis without any evidence of stenosis of the aortic valve.  8. There is dilatation of the aortic root 42 mm.  Myocardial perfusion imaging   There was no ST segment deviation noted during stress.  Nuclear stress EF: 58%. No wall motion abnormalities  This is a low risk study. No ischemia, no infarct. Prior LAD stent.   MCandee Furbish MD  Subjective: Denies dyspnea or chest pain, transferred to chair with assistance today. Wants to go home.  Discharge Exam: Vitals:   03/18/18 0558 03/18/18 0750  BP: (!) 146/63  Pulse: 62   Resp: 18   Temp: 97.7 F (36.5 C)   SpO2: 90% 94%   General: Pt is alert, awake, not in acute distress Cardiovascular: RRR, S1/S2 +, no rubs, no gallops Respiratory: Nonlabored and clear Abdominal: Soft, NT, ND, bowel sounds + Extremities: Trace edema, no cyanosis  Labs: BNP (last 3 results) Recent Labs    03/12/18 1452 03/15/18 0055  BNP 82.1 88.8   Basic Metabolic Panel: Recent Labs  Lab 03/12/18 1452 03/15/18 0055 03/16/18 0556 03/17/18 0547 03/18/18 0859  NA 140 139 139 140 138  K 4.3 4.0 4.1 3.8 3.8  CL 105 107 105 104 103  CO2 19* 25 25 27 25   GLUCOSE 92 123* 112* 113* 147*  BUN 20 18 16 22  27*  CREATININE 1.09 0.97 0.91 0.98 1.13  CALCIUM 9.2 8.8* 8.9 9.0 8.9   Liver Function Tests: Recent Labs  Lab 03/12/18 1452 03/15/18 0055 03/16/18 0556  AST 48*  26 22  ALT 20 20 17   ALKPHOS 69 61 50  BILITOT 0.4 0.6 0.6  PROT 6.8 7.3 7.3  ALBUMIN 3.4* 3.3* 3.2*   No results for input(s): LIPASE, AMYLASE in the last 168 hours. No results for input(s): AMMONIA in the last 168 hours. CBC: Recent Labs  Lab 03/12/18 1452 03/15/18 0055 03/16/18 0556  WBC 11.7* 9.8 9.2  NEUTROABS 6.5 5.1  --   HGB 14.3 13.4 13.9  HCT 42.8 42.1 44.4  MCV 88 92.1 91.4  PLT 247 256 301   Cardiac Enzymes: Recent Labs  Lab 03/15/18 0814 03/15/18 1356 03/15/18 2002  TROPONINI 0.10* 0.09* 0.07*   BNP: Invalid input(s): POCBNP CBG: No results for input(s): GLUCAP in the last 168 hours. D-Dimer No results for input(s): DDIMER in the last 72 hours. Hgb A1c No results for input(s): HGBA1C in the last 72 hours. Lipid Profile No results for input(s): CHOL, HDL, LDLCALC, TRIG, CHOLHDL, LDLDIRECT in the last 72 hours. Thyroid function studies No results for input(s): TSH, T4TOTAL, T3FREE, THYROIDAB in the last 72 hours.  Invalid input(s): FREET3 Anemia work up No results for input(s): VITAMINB12, FOLATE, FERRITIN, TIBC, IRON, RETICCTPCT in the last 72 hours. Urinalysis    Component Value Date/Time   COLORURINE YELLOW 08/04/2014 1620   APPEARANCEUR CLEAR 08/04/2014 1620   LABSPEC 1.020 08/04/2014 1620   PHURINE 6.0 08/04/2014 1620   GLUCOSEU NEGATIVE 08/04/2014 1620   HGBUR SMALL (A) 08/04/2014 1620   BILIRUBINUR NEGATIVE 08/04/2014 1620   BILIRUBINUR ++ 11/18/2012 1319   KETONESUR NEGATIVE 08/04/2014 1620   PROTEINUR NEGATIVE 08/04/2014 1620   UROBILINOGEN 0.2 08/04/2014 1620   NITRITE NEGATIVE 08/04/2014 1620   LEUKOCYTESUR NEGATIVE 08/04/2014 1620    Microbiology No results found for this or any previous visit (from the past 240 hour(s)).  Time coordinating discharge: Approximately 40 minutes  Patrecia Pour, MD  [facility] 03/18/2018, 1:40 PM Pager [phone]

## 2018-03-19 ENCOUNTER — Telehealth: Payer: Self-pay | Admitting: Physician Assistant

## 2018-03-19 ENCOUNTER — Ambulatory Visit (HOSPITAL_COMMUNITY): Payer: Medicare HMO

## 2018-03-19 NOTE — Telephone Encounter (Signed)
Great. It's already sent, so it should be good to go!

## 2018-03-19 NOTE — Telephone Encounter (Signed)
Spoke with daughter(DPR) who stated her dad was doing better and scheduled a follow up office visit with Dr Nicki Reaper next week.

## 2018-03-19 NOTE — Telephone Encounter (Signed)
Left message on daughter [name] voicemail to return call

## 2018-03-19 NOTE — Telephone Encounter (Signed)
Medication has been approved through 01/2019. Will call pt and let them know.

## 2018-03-19 NOTE — Telephone Encounter (Signed)
Diagnosis Acute on chronic CHF Dyspnea Weakness  Patient recently in the hospital Should be doing follow-up office visit within the next 10 days If they have not already scheduled please have him schedule Please talk with the family work with him and figure out what needs to be done with insurance company  If family wants this to be called in for them to have an Environmental consultant at home for what ever hours insurance company will qualify him for that would be fine I am not familiar with the program the daughter speaks of that [employer] provides  Thank you for your help

## 2018-03-19 NOTE — Telephone Encounter (Signed)
Pt's daughter is aware of approval. Please send RX to [facility].

## 2018-03-19 NOTE — Telephone Encounter (Signed)
Left message for pt to call back. Per Doreene Adas, PA, Echo has been cancelled due to the pt being in the hospital. He had an echo done on 03/15/18 and has f/u TOC appt with Kerin Ransom, [place] on 2/20. Was calling to inform pt of cancelled echo.

## 2018-03-20 ENCOUNTER — Other Ambulatory Visit (HOSPITAL_COMMUNITY): Payer: Medicare HMO

## 2018-03-20 ENCOUNTER — Encounter (HOSPITAL_COMMUNITY): Payer: Medicare HMO

## 2018-03-27 ENCOUNTER — Ambulatory Visit: Payer: Medicare HMO | Admitting: Cardiology

## 2018-03-27 ENCOUNTER — Ambulatory Visit: Payer: Medicare HMO | Admitting: Family Medicine

## 2018-03-27 ENCOUNTER — Encounter: Payer: Self-pay | Admitting: Cardiology

## 2018-03-27 ENCOUNTER — Other Ambulatory Visit: Payer: Self-pay

## 2018-03-27 VITALS — BP 152/64 | HR 57 | Ht 73.0 in | Wt 295.0 lb

## 2018-03-27 DIAGNOSIS — G4733 Obstructive sleep apnea (adult) (pediatric): Secondary | ICD-10-CM | POA: Diagnosis not present

## 2018-03-27 DIAGNOSIS — K5190 Ulcerative colitis, unspecified, without complications: Secondary | ICD-10-CM

## 2018-03-27 DIAGNOSIS — Z9861 Coronary angioplasty status: Secondary | ICD-10-CM

## 2018-03-27 DIAGNOSIS — I2510 Atherosclerotic heart disease of native coronary artery without angina pectoris: Secondary | ICD-10-CM | POA: Diagnosis not present

## 2018-03-27 DIAGNOSIS — J439 Emphysema, unspecified: Secondary | ICD-10-CM

## 2018-03-27 DIAGNOSIS — R0609 Other forms of dyspnea: Secondary | ICD-10-CM | POA: Diagnosis not present

## 2018-03-27 DIAGNOSIS — I5032 Chronic diastolic (congestive) heart failure: Secondary | ICD-10-CM | POA: Insufficient documentation

## 2018-03-27 DIAGNOSIS — I5031 Acute diastolic (congestive) heart failure: Secondary | ICD-10-CM

## 2018-03-27 DIAGNOSIS — R0600 Dyspnea, unspecified: Secondary | ICD-10-CM

## 2018-03-27 DIAGNOSIS — E785 Hyperlipidemia, unspecified: Secondary | ICD-10-CM | POA: Diagnosis not present

## 2018-03-27 DIAGNOSIS — I10 Essential (primary) hypertension: Secondary | ICD-10-CM

## 2018-03-27 MED ORDER — LOSARTAN POTASSIUM 25 MG PO TABS: 25.0000 mg | ORAL_TABLET | Freq: Every morning | ORAL | 1 refills | Status: DC

## 2018-03-27 MED ORDER — FUROSEMIDE 40 MG PO TABS: 40.0000 mg | ORAL_TABLET | Freq: Every morning | ORAL | 0 refills | Status: DC

## 2018-03-27 MED ORDER — AMLODIPINE BESYLATE 2.5 MG PO TABS: 2.5000 mg | ORAL_TABLET | Freq: Every morning | ORAL | 5 refills | Status: DC

## 2018-03-27 NOTE — Patient Instructions (Signed)
Medication Instructions:  TAKE Lasix, Norvasc, and Losartan daily every morning IF YOU GAIN MORE THAN 5 POUNDS DOUBLE LASIX DOSE TO 80MG ONCE A DAY UNTIL WEIGHT DECREASES BACK TO NORMAL WEIGHT  If you need a refill on your cardiac medications before your next appointment, please call your pharmacy.   Lab work: None  If you have labs (blood work) drawn today and your tests are completely normal, you will receive your results only by: Marland Kitchen MyChart Message (if you have MyChart) OR . A paper copy in the mail If you have any lab test that is abnormal or we need to change your treatment, we will call you to review the results.  Testing/Procedures: None  Follow-Up: At [facility], you and your health needs are our priority.  As part of our continuing mission to provide you with exceptional heart care, we have created designated Provider Care Teams.  These Care Teams include your primary Cardiologist (physician) and Advanced Practice Providers (APPs -  Physician Assistants and Nurse Practitioners) who all work together to provide you with the care you need, when you need it.  . Your physician recommends that you schedule a follow-up appointment in: 6 weeks with Kerin Ransom, PA-C  Any Other Special Instructions Will Be Listed Below (If Applicable). Check your blood pressure daily  Check your weight daily or at least 3-4 times a week

## 2018-03-27 NOTE — Assessment & Plan Note (Signed)
Obstructive sleep apnea on C-pap

## 2018-03-27 NOTE — Assessment & Plan Note (Signed)
Admitted 2/7-2/12/2018 with acute diastolic CHF- weight 943 improved to 289 lbs BNP was normal

## 2018-03-27 NOTE — Assessment & Plan Note (Signed)
Documented by CXR Feb 2020

## 2018-03-27 NOTE — Progress Notes (Signed)
03/27/2018 [name]   10/23/1940  [id number]  Primary Physician [name], MD Primary Cardiologist: Dr Gwenlyn Found  HPI: [name] is a pleasant 78 year old male followed by Dr. Gwenlyn Found with a history of coronary disease.  He had a LAD intervention in 1996.  Myoview in 2011 was low risk.  He saw Dr. Alvester Chou in the office February 5 and complained of dyspnea on exertion.  Myoview and echo were ordered.  He ended up in the emergency room 03/15/2018 with shortness of breath.  Cardiology saw him in consult.  Work-up in the hospital included an echocardiogram which showed his ejection fraction to be 60 to 65%.  Myoview was low risk.  His BNP was 76.  His troponins were slightly positive at 0.1.  Chest x-ray showed COPD.  He was treated with IV diuretics and diuresed about 11 pounds.  His admission weight was 302 pounds, discharge weight 289 pounds.  Symptomatically he improved.  He is in the office today for follow-up, his daughter accompanied him.  Since discharge she is continued to do well.  His weight in the office today is 295 pounds but he is fully clothed.   Current Outpatient Medications  Medication Sig Dispense Refill  . acetaminophen (TYLENOL) 325 MG tablet Take 650 mg by mouth every 6 (six) hours as needed for moderate pain or headache.    Marland Kitchen amLODipine (NORVASC) 2.5 MG tablet Take 1 tablet (2.5 mg total) by mouth every morning. 30 tablet 5  . aspirin 81 MG tablet Take 1 tablet (81 mg total) by mouth at bedtime. 30 tablet   . balsalazide (COLAZAL) 750 MG capsule Take 3 capsules (2,250 mg total) by mouth 3 (three) times daily. 270 capsule 2  . furosemide (LASIX) 40 MG tablet Take 1 tablet (40 mg total) by mouth every morning. 30 tablet 0  . losartan (COZAAR) 25 MG tablet Take 1 tablet (25 mg total) by mouth every morning. 90 tablet 1  . metoprolol tartrate (LOPRESSOR) 50 MG tablet One po QHS (Patient taking differently: Take 50 mg by mouth at bedtime. One po QHS) 30 tablet 5  .  nitroGLYCERIN (NITROSTAT) 0.4 MG SL tablet Place 0.4 mg under the tongue every 5 (five) minutes as needed for chest pain.    Marland Kitchen OVER THE COUNTER MEDICATION Omega XL 4 tablets at night    . pravastatin (PRAVACHOL) 40 MG tablet Take 1 tablet (40 mg total) by mouth at bedtime. 90 tablet 0  . sertraline (ZOLOFT) 50 MG tablet Take 1 tablet (50 mg total) by mouth daily. 30 tablet 5   No current facility-administered medications for this visit.     No Known Allergies  Past Medical History:  Diagnosis Date  . Asthma   . BPH (benign prostatic hyperplasia)   . CAD (coronary artery disease) 01/1995  . Clostridium difficile colitis 04/2005  . Colitis 2011  . COPD (chronic obstructive pulmonary disease) ([place])   . Depression   . Diverticulosis   . DJD (degenerative joint disease)   . Gastric ulcer 04/17/10   Three 60m gastric ulcers, H.pylori serologies were negative  . GERD (gastroesophageal reflux disease)   . History of kidney stones   . Hyperlipidemia   . Hypertension   . Idiopathic chronic inflammatory bowel disease 05/18/2010   left-sided UC  . Kidney stone   . Morbid obesity ([place] 03/12/2018  . Obstructive sleep apnea   . Reflux 02/1995  . S/P endoscopy 07/24/10   retained gastric contents, benign  bx    Social History   Socioeconomic History  . Marital status: Widowed    Spouse name: Not on file  . Number of children: Not on file  . Years of education: Not on file  . Highest education level: Not on file  Occupational History  . 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 Financial resource strain: Not on file  . Food insecurity:    Worry: Not on file    Inability: Not on file  . Transportation needs:    Medical: Not on file    Non-medical: Not on file  Tobacco Use  . Smoking status: Former Smoker    Packs/day: 1.00    Years: 20.00    Pack years: 20.00    Types: Cigarettes    Last attempt to quit: 09/30/1969    Years since quitting: 48.5  . Smokeless  tobacco: Never Used  Substance and Sexual Activity  . Alcohol use: No  . Drug use: No  . Sexual activity: Yes    Partners: Female    Birth control/protection: None    Comment: spouse  Lifestyle  . Physical activity:    Days per week: Not on file    Minutes per session: Not on file  . Stress: Not on file  Relationships  . Social connections:    Talks on phone: Not on file    Gets together: Not on file    Attends religious service: Not on file    Active member of club or organization: Not on file    Attends meetings of clubs or organizations: Not on file    Relationship status: Not on file  . Intimate partner violence:    Fear of current or ex partner: Not on file    Emotionally abused: Not on file    Physically abused: Not on file    Forced sexual activity: Not on file  Other Topics Concern  . Not on file  Social History Narrative  . Not on file     Family History  Problem Relation Age of Onset  . Lung cancer Mother   . Cancer Mother        breast  . Diabetes Mother   . Stroke Father   . Hypertension Father   . Kidney failure Brother   . Other Child        blood infection  . Colon cancer Neg Hx      Review of Systems: General: negative for chills, fever, night sweats or weight changes.  Cardiovascular: negative for chest pain, dyspnea on exertion, edema, orthopnea, palpitations, paroxysmal nocturnal dyspnea or shortness of breath Dermatological: negative for rash Respiratory: negative for cough or wheezing Urologic: negative for hematuria Abdominal: negative for nausea, vomiting, diarrhea, bright red blood per rectum, melena, or hematemesis Neurologic: negative for visual changes, syncope, or dizziness All other systems reviewed and are otherwise negative except as noted above.    Blood pressure (!) 152/64, pulse (!) 57, height 6' 1"  (1.854 m), weight 295 lb (133.8 kg), SpO2 91 %.  General appearance: alert, cooperative, no distress and morbidly obese Neck:  no JVD and limitied mobility Lungs: rhonchi on Lt, few basilar rales on Rt Heart: regular rate and rhythm and soft systolic murmur AO Extremities: no edema Skin: Skin color, texture, turgor normal. No rashes or lesions Neurologic: Grossly normal    ASSESSMENT AND PLAN:   Acute diastolic (congestive) heart failure ([place]) Admitted 2/7-2/12/2018 with acute diastolic CHF- weight 706  improved to 289 lbs BNP was normal  Essential hypertension Repeat B/P by me 142/80 with large cuff- he has not taken his medications yet today  CAD S/P percutaneous coronary angioplasty Stent in 1996-negative Myoview 2011 and Feb 2020  COPD (chronic obstructive pulmonary disease) ([place]) Documented by CXR Feb 2020  Obstructive sleep apnea Obstructive sleep apnea on C-pap  Morbid obesity ([name]) BMI 39   PLAN  I suspect his good weight is 290 lbs. I instructed them to weigh 3-4 times a week and double his Lasix if his weight goes up by 5 lbs.  He is also taking his medications in the evening (except Lasix).  I told them to take his Cozaar and Norvasc in the morning for better B/P control.  F/U in 6 weeks with BMP.  BNP will not be useful in his case.   Kerin Ransom PA-C 03/27/2018 8:48 AM

## 2018-03-27 NOTE — Assessment & Plan Note (Signed)
Stent in 1996-negative Myoview 2011 and Feb 2020

## 2018-03-27 NOTE — Assessment & Plan Note (Signed)
Repeat B/P by me 142/80 with large cuff- he has not taken his medications yet today

## 2018-03-27 NOTE — Assessment & Plan Note (Signed)
BMI 39 

## 2018-03-28 NOTE — Telephone Encounter (Signed)
I50.20 - CHF R06.00 - Dyspnes R53.1 - weakness  [facility] for pt's daughter, need to give the above codes

## 2018-03-31 ENCOUNTER — Ambulatory Visit (INDEPENDENT_AMBULATORY_CARE_PROVIDER_SITE_OTHER): Payer: Medicare HMO | Admitting: Family Medicine

## 2018-03-31 ENCOUNTER — Encounter: Payer: Self-pay | Admitting: Family Medicine

## 2018-03-31 VITALS — BP 142/68 | Ht 73.0 in | Wt 296.0 lb

## 2018-03-31 DIAGNOSIS — R0609 Other forms of dyspnea: Secondary | ICD-10-CM | POA: Diagnosis not present

## 2018-03-31 DIAGNOSIS — R0600 Dyspnea, unspecified: Secondary | ICD-10-CM

## 2018-03-31 DIAGNOSIS — I10 Essential (primary) hypertension: Secondary | ICD-10-CM | POA: Diagnosis not present

## 2018-03-31 NOTE — Patient Instructions (Signed)
Continue sertraline Recheck April 2

## 2018-03-31 NOTE — Progress Notes (Signed)
   Subjective:    Patient ID: [name], male    DOB: 07/29/1940, 78 y.o.   MRN: [id number]  HPIHospitalization follow up. Went to ED on 2/7 for sob and chest tightness. Pt states he is doing well since being home and no concerns today. Staying with daughter since being home from hospital.   ER notes hospital notes labs x-rays everything was reviewed Discussion was held with patient regarding how he is doing He still has some shortness of breath but denies any major issues currently    Review of Systems  Constitutional: Negative for diaphoresis and fatigue.  HENT: Negative for congestion and rhinorrhea.   Respiratory: Negative for cough and shortness of breath.   Cardiovascular: Negative for chest pain and leg swelling.  Gastrointestinal: Negative for abdominal pain and diarrhea.  Skin: Negative for color change and rash.  Neurological: Negative for dizziness and headaches.  Psychiatric/Behavioral: Negative for behavioral problems and confusion.       Objective:   Physical Exam Vitals signs reviewed.  Constitutional:      General: He is not in acute distress. HENT:     Head: Normocephalic and atraumatic.  Eyes:     General:        Right eye: No discharge.        Left eye: No discharge.  Neck:     Trachea: No tracheal deviation.  Cardiovascular:     Rate and Rhythm: Normal rate and regular rhythm.     Heart sounds: Normal heart sounds. No murmur.  Pulmonary:     Effort: Pulmonary effort is normal. No respiratory distress.     Breath sounds: Normal breath sounds.  Lymphadenopathy:     Cervical: No cervical adenopathy.  Skin:    General: Skin is warm and dry.  Neurological:     Mental Status: He is alert.     Coordination: Coordination normal.  Psychiatric:        Behavior: Behavior normal.     Patient currently staying with his daughter Patient was encouraged to eat healthy stay active Patient was encouraged to avoid excessive salt Patient encouraged to  stick with daytime driving      Assessment & Plan:  DOE associated with COPD pulmonary function tests ordered await the results may need to be put on inhalers  Blood pressure good controlled check with sitting and standing no appreciable difference continue current measures  GI colitis follow-up with gastroenterology  Patient to follow-up here in 6 to 8 weeks

## 2018-04-01 ENCOUNTER — Encounter: Payer: Self-pay | Admitting: Family Medicine

## 2018-04-01 LAB — BASIC METABOLIC PANEL
BUN/Creatinine Ratio: 12 (ref 10–24)
BUN: 13 mg/dL (ref 8–27)
CO2: 26 mmol/L (ref 20–29)
Calcium: 9.9 mg/dL (ref 8.6–10.2)
Chloride: 103 mmol/L (ref 96–106)
Creatinine, Ser: 1.06 mg/dL (ref 0.76–1.27)
GFR calc Af Amer: 77 mL/min/{1.73_m2} (ref >59)
GFR calc non Af Amer: 66 mL/min/{1.73_m2} (ref >59)
Glucose: 97 mg/dL (ref 65–99)
Potassium: 4.4 mmol/L (ref 3.5–5.2)
Sodium: 144 mmol/L (ref 134–144)

## 2018-04-03 ENCOUNTER — Telehealth (HOSPITAL_COMMUNITY): Payer: Self-pay

## 2018-04-03 NOTE — Telephone Encounter (Signed)
Encounter complete. 

## 2018-04-04 ENCOUNTER — Telehealth (HOSPITAL_COMMUNITY): Payer: Self-pay

## 2018-04-04 NOTE — Telephone Encounter (Signed)
Encounter complete. 

## 2018-04-08 ENCOUNTER — Inpatient Hospital Stay (HOSPITAL_COMMUNITY): Admission: RE | Admit: 2018-04-08 | Payer: Medicare HMO | Source: Ambulatory Visit

## 2018-04-08 ENCOUNTER — Ambulatory Visit: Payer: Medicare HMO | Admitting: Internal Medicine

## 2018-04-09 ENCOUNTER — Ambulatory Visit (HOSPITAL_COMMUNITY): Payer: Medicare HMO

## 2018-04-10 ENCOUNTER — Ambulatory Visit: Payer: Medicare HMO | Admitting: Podiatry

## 2018-04-18 ENCOUNTER — Telehealth: Payer: Self-pay | Admitting: *Deleted

## 2018-04-18 ENCOUNTER — Other Ambulatory Visit: Payer: Self-pay

## 2018-04-18 MED ORDER — FUROSEMIDE 40 MG PO TABS: 40.0000 mg | ORAL_TABLET | Freq: Every morning | ORAL | 6 refills | Status: DC

## 2018-04-18 NOTE — Telephone Encounter (Signed)
Fax from [employer] requesting a refill on lasix 102m. Originally prescribed by dr gBonner Puna Requesting refill from dr scott. Last med check up 03/31/18

## 2018-04-18 NOTE — Telephone Encounter (Signed)
Medication sent to [facility]

## 2018-04-18 NOTE — Telephone Encounter (Signed)
Patient's daughter would like a return call in regards of requesting a lift chair for patient. Advise

## 2018-04-18 NOTE — Telephone Encounter (Signed)
Left message to return call 

## 2018-04-18 NOTE — Telephone Encounter (Signed)
May have the prescription to be through my name 30-day with 6 refills

## 2018-04-21 NOTE — Telephone Encounter (Signed)
Left message to return call 

## 2018-04-22 ENCOUNTER — Other Ambulatory Visit: Payer: Self-pay | Admitting: Family Medicine

## 2018-04-22 MED ORDER — TRIAMCINOLONE ACETONIDE 0.1 % EX CREA: TOPICAL_CREAM | CUTANEOUS | 0 refills | Status: DC

## 2018-04-22 NOTE — Telephone Encounter (Signed)
May have a prescription for a lift chair  Ataxia with leg weakness is a diagnosis Use [facility] Triamcinolone cream 45 g tube apply twice daily PRN to the psoriasis Do the best they can having patient stay at home avoid contact from other people

## 2018-04-22 NOTE — Telephone Encounter (Signed)
Script for lift chair awaiting signature; pt daughter contacted and is aware. Pt daughter verbalized understanding.

## 2018-04-22 NOTE — Telephone Encounter (Signed)
Angie pt's daughter calling to get prescriptions. Having trouble getting up. Would like a rx for a lift chair. He is staying with her in [place] and would like the chair to come to her house in [place]. [facility] is the closest home medical supplier.   Also has psosorias on scalp that smells bad. Tried otc head and shoulders, vinegar, coconut oil.  [facility] [place].

## 2018-05-05 ENCOUNTER — Ambulatory Visit: Payer: Medicare HMO | Admitting: Podiatry

## 2018-05-07 ENCOUNTER — Telehealth: Payer: Self-pay

## 2018-05-07 NOTE — Telephone Encounter (Signed)
Called patient he answered then hung up. I attempted to call back but no answer. Left message on home phone as well.

## 2018-05-07 NOTE — Telephone Encounter (Signed)
Follow up  ° ° °Patient is returning call.  °

## 2018-05-08 ENCOUNTER — Ambulatory Visit: Payer: Medicare HMO | Admitting: Family Medicine

## 2018-05-08 NOTE — Telephone Encounter (Signed)
Virtual Visit Pre-Appointment Phone Call  Steps For Call:  1. Confirm consent - "In the setting of the current Covid19 crisis, you are scheduled for a PHONE visit with Kerin Ransom provider on 05/09/2018 at 10:30AM.  Just as we do with many in-office visits, in order for you to participate in this visit, we must obtain consent.  If you'd like, I can send this to your mychart (if signed up) or email for you to review.  Otherwise, I can obtain your verbal consent now.  All virtual visits are billed to your insurance company just like a normal visit would be.  By agreeing to a virtual visit, we'd like you to understand that the technology does not allow for your provider to perform an examination, and thus may limit your provider's ability to fully assess your condition.  Finally, though the technology is pretty good, we cannot assure that it will always work on either your or our end, and in the setting of a video visit, we may have to convert it to a phone-only visit.  In either situation, we cannot ensure that we have a secure connection.  Are you willing to proceed?"  2. Give patient instructions for WebEx download to smartphone as below if video visit  3. Advise patient to be prepared with any vital sign or heart rhythm information, their current medicines, and a piece of paper and pen handy for any instructions they may receive the day of their visit  4. Inform patient they will receive a phone call 15 minutes prior to their appointment time (may be from unknown caller ID) so they should be prepared to answer  5. Confirm that appointment type is correct in Epic appointment notes (video vs telephone)    TELEPHONE CALL NOTE  [name] has been deemed a candidate for a follow-up tele-health visit to limit community exposure during the Covid-19 pandemic. I spoke with the patient via phone to ensure availability of phone/video source, confirm preferred email & phone number, and discuss  instructions and expectations.  I reminded [name] to be prepared with any vital sign and/or heart rhythm information that could potentially be obtained via home monitoring, at the time of his visit. I reminded [name] to expect a phone call at the time of his visit if his visit.  Did the patient verbally acknowledge consent to treatment? YES  Harold Hedge, CMA 05/08/2018 9:58 AM   DOWNLOADING THE [place]  - If Apple, go to [employer] and type in WebEx in the search bar. [place] [employer], the blue/green circle. The app is free but as with any other app downloads, their phone may require them to verify saved payment information or Apple password. The patient does NOT have to create an account.  - If Android, ask patient to go to [employer] and type in WebEx in the search bar. [place] [employer], the blue/green circle. The app is free but as with any other app downloads, their phone may require them to verify saved payment information or Android password. The patient does NOT have to create an account.   CONSENT FOR TELE-HEALTH VISIT - PLEASE REVIEW  I hereby voluntarily request, consent and authorize [facility] and its employed or contracted physicians, physician assistants, nurse practitioners or other licensed health care professionals (the Practitioner), to provide me with telemedicine health care services (the "Services") as deemed necessary by the treating Practitioner. I  acknowledge and consent to receive the Services by the Practitioner via telemedicine. I understand that the telemedicine visit will involve communicating with the Practitioner through live audiovisual communication technology and the disclosure of certain medical information by electronic transmission. I acknowledge that I have been given the opportunity to request an in-person assessment or other available alternative prior to the telemedicine visit  and am voluntarily participating in the telemedicine visit.  I understand that I have the right to withhold or withdraw my consent to the use of telemedicine in the course of my care at any time, without affecting my right to future care or treatment, and that the Practitioner or I may terminate the telemedicine visit at any time. I understand that I have the right to inspect all information obtained and/or recorded in the course of the telemedicine visit and may receive copies of available information for a reasonable fee.  I understand that some of the potential risks of receiving the Services via telemedicine include:  Marland Kitchen Delay or interruption in medical evaluation due to technological equipment failure or disruption; . Information transmitted may not be sufficient (e.g. poor resolution of images) to allow for appropriate medical decision making by the Practitioner; and/or  . In rare instances, security protocols could fail, causing a breach of personal health information.  Furthermore, I acknowledge that it is my responsibility to provide information about my medical history, conditions and care that is complete and accurate to the best of my ability. I acknowledge that Practitioner's advice, recommendations, and/or decision may be based on factors not within their control, such as incomplete or inaccurate data provided by me or distortions of diagnostic images or specimens that may result from electronic transmissions. I understand that the practice of medicine is not an exact science and that Practitioner makes no warranties or guarantees regarding treatment outcomes. I acknowledge that I will receive a copy of this consent concurrently upon execution via email to the email address I last provided but may also request a printed copy by calling the office of [place].    I understand that my insurance will be billed for this visit.   I have read or had this consent read to me. . I understand  the contents of this consent, which adequately explains the benefits and risks of the Services being provided via telemedicine.  . I have been provided ample opportunity to ask questions regarding this consent and the Services and have had my questions answered to my satisfaction. . I give my informed consent for the services to be provided through the use of telemedicine in my medical care  By participating in this telemedicine visit I agree to the above.

## 2018-05-08 NOTE — Telephone Encounter (Signed)
   Cardiac Questionnaire:    Since your last visit or hospitalization:    1. Have you been having new or worsening chest pain? NO   2. Have you been having new or worsening shortness of breath? NO 3. Have you been having new or worsening leg swelling, wt gain, or increase in abdominal girth (pants fitting more tightly)? NO   4. Have you had any passing out spells? NO    *A YES to any of these questions would result in the appointment being kept. *If all the answers to these questions are NO, we should indicate that given the current situation regarding the worldwide coronarvirus pandemic, at the recommendation of the CDC, we are looking to limit gatherings in our waiting area, and thus will reschedule their appointment beyond four weeks from today.   _____________   LHTDS-28 Pre-Screening Questions:  . Do you currently have a fever? NO (yes = cancel and refer to pcp for e-visit) . Have you recently travelled on a cruise, internationally, or to [place], [place], [place], [place], [place], or [place] [employer]) ? NO (yes = cancel, stay home, monitor symptoms, and contact pcp or initiate e-visit if symptoms develop) . Have you been in contact with someone that is currently pending confirmation of Covid19 testing or has been confirmed to have the [place] virus?  NO (yes = cancel, stay home, away from tested individual, monitor symptoms, and contact pcp or initiate e-visit if symptoms develop) . Are you currently experiencing fatigue or cough? NO (yes = pt should be prepared to have a mask placed at the time of their visit).

## 2018-05-09 ENCOUNTER — Telehealth (INDEPENDENT_AMBULATORY_CARE_PROVIDER_SITE_OTHER): Payer: Medicare HMO | Admitting: Cardiology

## 2018-05-09 ENCOUNTER — Encounter: Payer: Self-pay | Admitting: Cardiology

## 2018-05-09 ENCOUNTER — Telehealth: Payer: Self-pay | Admitting: Cardiology

## 2018-05-09 ENCOUNTER — Telehealth: Payer: Self-pay | Admitting: Cardiovascular Disease

## 2018-05-09 VITALS — BP 164/84 | HR 53 | Temp 99.0°F | Ht 73.0 in | Wt 283.0 lb

## 2018-05-09 DIAGNOSIS — J439 Emphysema, unspecified: Secondary | ICD-10-CM

## 2018-05-09 DIAGNOSIS — J449 Chronic obstructive pulmonary disease, unspecified: Secondary | ICD-10-CM | POA: Diagnosis not present

## 2018-05-09 DIAGNOSIS — Z9861 Coronary angioplasty status: Secondary | ICD-10-CM | POA: Diagnosis not present

## 2018-05-09 DIAGNOSIS — I2510 Atherosclerotic heart disease of native coronary artery without angina pectoris: Secondary | ICD-10-CM

## 2018-05-09 DIAGNOSIS — E785 Hyperlipidemia, unspecified: Secondary | ICD-10-CM

## 2018-05-09 DIAGNOSIS — Z6839 Body mass index (BMI) 39.0-39.9, adult: Secondary | ICD-10-CM

## 2018-05-09 DIAGNOSIS — Z9989 Dependence on other enabling machines and devices: Secondary | ICD-10-CM

## 2018-05-09 DIAGNOSIS — G4733 Obstructive sleep apnea (adult) (pediatric): Secondary | ICD-10-CM

## 2018-05-09 DIAGNOSIS — I10 Essential (primary) hypertension: Secondary | ICD-10-CM | POA: Diagnosis not present

## 2018-05-09 NOTE — Telephone Encounter (Signed)
Follow up    Pts daugther Angie is returning call  Please call back

## 2018-05-09 NOTE — Telephone Encounter (Signed)
Called patient and LVM to call back to schedule 8 week followup with Dr. Gwenlyn Found or Lurena Joiner.

## 2018-05-09 NOTE — Telephone Encounter (Signed)
Trying to reach patients daughter to complete virtual visit

## 2018-05-09 NOTE — Progress Notes (Signed)
Virtual Visit via Telephone Note    Evaluation Performed:  Follow-up visit  This visit type was conducted due to national recommendations for restrictions regarding the COVID-19 Pandemic (e.g. social distancing).  This format is felt to be most appropriate for this patient at this time.  All issues noted in this document were discussed and addressed.  No physical exam was performed (except for noted visual exam findings with Video Visits).  Please refer to the patient's chart (MyChart message for video visits and phone note for telephone visits) for the patient's consent to telehealth for [facility].  Date:  05/09/2018   ID:  Rulon, Abdalla 28-Apr-1940, MRN [id number]  Patient Location: [place] [address]   Provider location:   [place]  PCP:  [name], MD  Cardiologist:  Quay Burow, MD  Electrophysiologist:  None   Chief Complaint:  Follow up  History of Present Illness:    JAROLD MACOMBER is a 78 y.o. male who presents via audio/video conferencing for a telehealth visit today.  The patient has a history of coronary disease.  He had a LAD intervention in 1996.  Myoview in 2011 was low risk.  He saw Dr. Gwenlyn Found in the office 03/12/2018 and complained of dyspnea on exertion.  Myoview and echo were ordered.    He ended up in the emergency room 03/15/2018 with shortness of breath. Cardiology saw him in consult.  Work-up in the hospital included an echocardiogram which showed his ejection fraction to be 60 to 65%.  Myoview was low risk.  His BNP was 76.  His troponins were slightly positive at 0.1.  Chest x-ray showed COPD.  He was treated with IV diuretics and diuresed about 11 pounds.  His admission weight was 302 pounds, discharge weight 289 pounds.    He was seen in follow-up on 03/27/2018 and was doing well.  We had estimated his goal weight to be about 290 pounds.  He was to come to the office today for a follow-up but this was  changed to virtual visit because of the [place] VID pandemic.  I spoke with the patient over the phone.  He gave me an interesting history of having a fever and shortness of breath about 5 nights ago.  He called EMS and when they got there he says they told him he was okay.  The patient has had no further trouble.  He has had no further fevers.  He says his weight is 284 pounds and has been steady.  Overall he feels like he is doing pretty well, he is pretty much staying inside during this pandemic lock down.   The patient does not symptoms concerning for COVID-19 infection (fever, chills, cough, or new SHORTNESS OF BREATH).    Prior CV studies:   The following studies were reviewed today:  Past Medical History:  Diagnosis Date  . Asthma   . BPH (benign prostatic hyperplasia)   . CAD (coronary artery disease) 01/1995  . Clostridium difficile colitis 04/2005  . Colitis 2011  . COPD (chronic obstructive pulmonary disease) ([place])   . Depression   . Diverticulosis   . DJD (degenerative joint disease)   . Gastric ulcer 04/17/10   Three 52m gastric ulcers, H.pylori serologies were negative  . GERD (gastroesophageal reflux disease)   . History of kidney stones   . Hyperlipidemia   . Hypertension   . Idiopathic chronic inflammatory bowel disease 05/18/2010   left-sided  UC  . Kidney stone   . Morbid obesity ([place]) 03/12/2018  . Obstructive sleep apnea   . Reflux 02/1995  . S/P endoscopy 07/24/10   retained gastric contents, benign bx   Past Surgical History:  Procedure Laterality Date  . ANORECTAL MANOMETRY  2016   [facility]: concern for possible fissure. Noted pelvic floor dyssnyergy  . BIOPSY  07/29/2017   Procedure: BIOPSY;  Surgeon: Daneil Dolin, MD;  Location: [facility] ENDO SU[facility];  Service: Endoscopy;;  ascending and sigmoid colon  . CARDIAC CATHETERIZATION     with stent  . CARDIOVASCULAR STRESS TEST  07/21/2009   No scintigraphic evidence of inducible myocardial ischemia  . CARPAL TUNNEL  RELEASE Left 01/17/2017   Procedure: LEFT CARPAL TUNNEL RELEASE;  Surgeon: Carole Civil, MD;  Location: [facility] ORS;  Service: Orthopedics;  Laterality: Left;  . CATARACT EXTRACTION, BILATERAL Bilateral   . CERVICAL SPINE SURGERY     C4-5  . COLONOSCOPY  04/2005   granularity and friability erosions from rectum to 40cm. Bx infection vs IBD. C. Diff positive at the time.   . COLONOSCOPY  05/2010   [facility]: left-sided UC, bx with no dysplasia, shallow diverticula  . COLONOSCOPY [name] 11/07/2012   IRW:[id number] proctocolitis status post segmental biopsy/Sigmoid colon polyps removed as described above. Procedure compromisd by technical difficulties. bx: Inflammation limited to sigmoid and rectum on pathology.  . COLONOSCOPY  04/2014   Dr. Nyoka Cowden at [facility]: well localized proctocolitis limited to sigmoid  . COLONOSCOPY WITH PROPOFOL [name] 07/29/2017   diverticulosis in colon, three 4-6 mm polyps at splenic flexure and in cecum, one 10 mm polyp in rectum, abnormal rectum and sigmoid consistent with active UC  . CORONARY STENT PLACEMENT  01/1995  . ESOPHAGOGASTRODUODENOSCOPY  07/24/2010   PYP:[id number] esophagus  . ESOPHAGOGASTRODUODENOSCOPY  04/2014   Dr. Nyoka Cowden at [facility]: negative small bowel biopsies  . FOOT SURGERY Bilateral two  . KNEE SURGERY  two  . POLYPECTOMY  07/29/2017   Procedure: POLYPECTOMY;  Surgeon: Daneil Dolin, MD;  Location[facility];  Service: Endoscopy;;  splenic flexure, ascending colon polyp;rectal  . SHOULDER SURGERY  two  . TONSILLECTOMY    . TRANSTHORACIC ECHOCARDIOGRAM  03/23/2009   EF 60-65%, normal LV systolic function  . ULNAR HEAD EXCISION Left 01/17/2017   Procedure: LEFT ULNAR HEAD RESECTION;  Surgeon: Carole Civil, MD;  Location: [facility] ORS;  Service: Orthopedics;  Laterality: Left;     Current Meds  Medication Sig  . acetaminophen (TYLENOL) 325 MG tablet Take 650 mg by mouth every 6 (six) hours as needed for moderate pain or headache.  Marland Kitchen amLODipine  (NORVASC) 2.5 MG tablet Take 1 tablet (2.5 mg total) by mouth every morning.  Marland Kitchen aspirin 81 MG tablet Take 1 tablet (81 mg total) by mouth at bedtime.  . balsalazide (COLAZAL) 750 MG capsule Take 3 capsules (2,250 mg total) by mouth 3 (three) times daily. (Patient taking differently: Take 2,250 mg by mouth 2 (two) times daily. )  . furosemide (LASIX) 40 MG tablet Take 1 tablet (40 mg total) by mouth every morning. (Patient taking differently: Take 20 mg by mouth every morning. )  . loperamide (IMODIUM) 2 MG capsule Take 2 mg by mouth daily.  Marland Kitchen losartan (COZAAR) 25 MG tablet Take 1 tablet (25 mg total) by mouth every morning.  . metoprolol tartrate (LOPRESSOR) 50 MG tablet One po QHS (Patient taking differently: Take 50 mg by mouth at bedtime. One po QHS)  . nitroGLYCERIN (  NITROSTAT) 0.4 MG SL tablet Place 0.4 mg under the tongue every 5 (five) minutes as needed for chest pain.  . pravastatin (PRAVACHOL) 40 MG tablet Take 1 tablet (40 mg total) by mouth at bedtime.  . sertraline (ZOLOFT) 50 MG tablet Take 1 tablet (50 mg total) by mouth daily.  Marland Kitchen triamcinolone cream (KENALOG) 0.1 % Apply twice daily as needed for psoriasis.     Allergies:   Patient has no known allergies.   Social History   Tobacco Use  . Smoking status: Former Smoker    Packs/day: 1.00    Years: 20.00    Pack years: 20.00    Types: Cigarettes    Last attempt to quit: 09/30/1969    Years since quitting: 48.6  . Smokeless tobacco: Never Used  Substance Use Topics  . Alcohol use: No  . Drug use: No     Family Hx: The patient's family history includes Cancer in his mother; Diabetes in his mother; Hypertension in his father; Kidney failure in his brother; Lung cancer in his mother; Other in his child; Stroke in his father. There is no history of Colon cancer.  ROS:   Please see the history of present illness.    All other systems reviewed and are negative.   Labs/Other Tests and Data Reviewed:    Recent Labs:  03/15/2018: B Natriuretic Peptide 76.2 03/16/2018: ALT 17; Hemoglobin 13.9; Platelets 301 03/31/2018: BUN 13; Creatinine, Ser 1.06; Potassium 4.4; Sodium 144   Recent Lipid Panel Lab Results  Component Value Date/Time   CHOL 130 03/12/2018 02:52 PM   TRIG 178 (H) 03/12/2018 02:52 PM   HDL 45 03/12/2018 02:52 PM   CHOLHDL 2.9 03/12/2018 02:52 PM   CHOLHDL 3.2 06/08/2015 09:07 AM   LDLCALC 49 03/12/2018 02:52 PM    Wt Readings from Last 3 Encounters:  05/09/18 283 lb (128.4 kg)  03/31/18 296 lb (134.3 kg)  03/27/18 295 lb (133.8 kg)     Exam:    Vital Signs:  BP (!) 164/84   Pulse (!) 53   Temp 99 F (37.2 C)   Ht 6' 1"  (1.854 m)   Wt 283 lb (128.4 kg)   BMI 37.34 kg/m    Patient in no acute distress on the phone  ASSESSMENT & PLAN:    Essential hypertension Repeat B/P by me 142/80 with large cuff in the office 03/27/2018- that was before he had taken his medications  CAD S/P percutaneous coronary angioplasty Stent in 1996-negative Myoview 2011 and Feb 2020  COPD (chronic obstructive pulmonary disease) ([place]) Documented by CXR Feb 2020  Obstructive sleep apnea Obstructive sleep apnea on C-pap  Morbid obesity ([place]) BMI 39  Plan:  Same Rx- f/u with myself or Dr Gwenlyn Found in 6 - 8 weeks   COVID-19 Education: The signs and symptoms of COVID-19 were discussed with the patient and how to seek care for testing (follow up with PCP or arrange E-visit).  The importance of social distancing was discussed today.  Patient Risk:   After full review of this patients clinical status, I feel that they are at least moderate risk at this time.  Time:   Today, I have spent 25 minutes with the patient with telehealth technology discussing CHF and his medications.     Medication Adjustments/Labs and Tests Ordered: Current medicines are reviewed at length with the patient today.  Concerns regarding medicines are outlined above.  Tests Ordered: No orders of the defined types were  placed in this  encounter.  Medication Changes: No orders of the defined types were placed in this encounter.   Disposition:  6-8 weeks with Dr Gwenlyn Found or myself  Signed, Kerin Ransom, PA-C  05/09/2018 3:22 PM    [facility]

## 2018-05-09 NOTE — Telephone Encounter (Signed)
Per Lurena Joiner I tried to contact patient lone last time and was successful with reaching patients daughter. EVisit started.

## 2018-05-09 NOTE — Patient Instructions (Signed)
Medication Instructions:  Your physician recommends that you continue on your current medications as directed. Please refer to the Current Medication list given to you today. If you need a refill on your cardiac medications before your next appointment, please call your pharmacy.   Lab work: None  If you have labs (blood work) drawn today and your tests are completely normal, you will receive your results only by: Marland Kitchen MyChart Message (if you have MyChart) OR . A paper copy in the mail If you have any lab test that is abnormal or we need to change your treatment, we will call you to review the results.  Testing/Procedures: None   Follow-Up: At [facility], you and your health needs are our priority.  As part of our continuing mission to provide you with exceptional heart care, we have created designated Provider Care Teams.  These Care Teams include your primary Cardiologist (physician) and Advanced Practice Providers (APPs -  Physician Assistants and Nurse Practitioners) who all work together to provide you with the care you need, when you need it. You will need a follow up appointment in 6-8 weeks.  You may see Quay Burow, MD or one of the following Advanced Practice Providers on your designated Care Team:   Kerin Ransom, PA-C Roby Lofts, [place] . Sande Rives, PA-C  Any Other Special Instructions Will Be Listed Below (If Applicable).

## 2018-05-09 NOTE — Telephone Encounter (Signed)
Will forward to Kila working with [place]

## 2018-05-09 NOTE — Telephone Encounter (Signed)
New Message    Pts daughter is calling and asking you to please call her number  For the virtual visit appt because her dad is hard of hearing

## 2018-05-14 ENCOUNTER — Telehealth: Payer: Self-pay | Admitting: Family Medicine

## 2018-05-14 NOTE — Telephone Encounter (Signed)
Scripts printed out. Fax to [facility] [phone] after signature. Please advise. Thank you

## 2018-05-14 NOTE — Telephone Encounter (Signed)
Scripts faxed to [facility] and also mailed to daughter per daughter request.

## 2018-05-14 NOTE — Telephone Encounter (Signed)
Pt's daughter [name] calling in requesting scripts for a lift chair and a wide walker with wheels on it.   She [facility] on [address] in [place].   They did not receive the script in March for the lift

## 2018-05-14 NOTE — Telephone Encounter (Signed)
These were signed thank you

## 2018-05-15 DIAGNOSIS — G4733 Obstructive sleep apnea (adult) (pediatric): Secondary | ICD-10-CM | POA: Diagnosis not present

## 2018-05-19 ENCOUNTER — Telehealth: Payer: Self-pay | Admitting: Family Medicine

## 2018-05-19 ENCOUNTER — Other Ambulatory Visit: Payer: Self-pay | Admitting: *Deleted

## 2018-05-19 MED ORDER — PRAVASTATIN SODIUM 40 MG PO TABS: 40.0000 mg | ORAL_TABLET | Freq: Every day | ORAL | 0 refills | Status: DC

## 2018-05-19 NOTE — Telephone Encounter (Signed)
I would be fine giving him this with 2 additional refills Please let the daughter know if he is having any trouble we can do a virtual visit Otherwise consider virtual visit in early May

## 2018-05-19 NOTE — Telephone Encounter (Signed)
Last med check up 03/31/18 and note states to follow up in 6 -8 weeks. No upcoming appt scheduled.

## 2018-05-19 NOTE — Telephone Encounter (Signed)
Pt's daughter calling requesting refill on pravastatin (PRAVACHOL) 40 MG tablet to be sent to [facility] [place], [place]  CB# [phone]

## 2018-05-19 NOTE — Telephone Encounter (Signed)
Discussed with pt's daughter refils sent and needs virtual visit early may or sooner if problems. She verbalized understanding. Daughter states she never heard back from [facility] about his walker and lift chair and she never got a copy of rx in the mail. refaxed rx to [facility] and a copy placed in the mail to the [place] address where pt is living.  [place]

## 2018-05-21 ENCOUNTER — Telehealth: Payer: Self-pay | Admitting: Cardiovascular Disease

## 2018-05-21 NOTE — Telephone Encounter (Signed)
Called patient to schedule 6 to 8 week followup with Dr. Gwenlyn Found from 05-09-18 appointment.

## 2018-06-03 ENCOUNTER — Telehealth: Payer: Self-pay | Admitting: Internal Medicine

## 2018-06-03 ENCOUNTER — Encounter: Payer: Self-pay | Admitting: Internal Medicine

## 2018-06-03 ENCOUNTER — Ambulatory Visit: Payer: Medicare HMO | Admitting: Internal Medicine

## 2018-06-03 ENCOUNTER — Other Ambulatory Visit: Payer: Self-pay

## 2018-06-03 NOTE — Telephone Encounter (Signed)
PATIENT WAS A NO SHOW AND LETTER SENT  °

## 2018-06-05 ENCOUNTER — Telehealth: Payer: Self-pay | Admitting: Family Medicine

## 2018-06-05 NOTE — Telephone Encounter (Signed)
So we will go ahead and write out a prescription for the correct walker When they get the walker have the daughter make sure it is the right type If for some reason it is not please have the agency specifically tell us if there is a different aspect to order

## 2018-06-05 NOTE — Telephone Encounter (Signed)
Daughter is calling because rx we sent for a walker is not the right kind.  She said that per [facility] on [address] in [place], due to patients weight (300lbs) that he needs a large one, wide walker with wheels and a seat. The only # I could find for [facility] is [phone]. Daughter's number is [phone]

## 2018-06-06 NOTE — Telephone Encounter (Signed)
Left message to return call to inform daughter that Dr.Scott is out of office and we will fax script when signed.

## 2018-06-06 NOTE — Telephone Encounter (Signed)
Script written out; awaiting signature

## 2018-06-09 NOTE — Telephone Encounter (Signed)
Daughter notified and script faxed

## 2018-07-09 DIAGNOSIS — Z85828 Personal history of other malignant neoplasm of skin: Secondary | ICD-10-CM | POA: Diagnosis not present

## 2018-07-09 DIAGNOSIS — D485 Neoplasm of uncertain behavior of skin: Secondary | ICD-10-CM | POA: Diagnosis not present

## 2018-07-09 DIAGNOSIS — L905 Scar conditions and fibrosis of skin: Secondary | ICD-10-CM | POA: Diagnosis not present

## 2018-07-10 DIAGNOSIS — C44519 Basal cell carcinoma of skin of other part of trunk: Secondary | ICD-10-CM | POA: Diagnosis not present

## 2018-07-18 ENCOUNTER — Telehealth: Payer: Self-pay | Admitting: Family Medicine

## 2018-07-18 MED ORDER — LOSARTAN POTASSIUM 25 MG PO TABS: 25.0000 mg | ORAL_TABLET | Freq: Every morning | ORAL | 0 refills | Status: DC

## 2018-07-18 NOTE — Telephone Encounter (Signed)
Ok three  [employer]

## 2018-07-18 NOTE — Telephone Encounter (Signed)
Pt is staying with his daughter in [place]  Need refill of losartan (COZAAR) 25 MG tablet  Please advise & call when done    [facility] [place]

## 2018-07-18 NOTE — Telephone Encounter (Signed)
Hospital follow up on 2/24 and note states to follow up in 6-8 weeks

## 2018-07-18 NOTE — Telephone Encounter (Signed)
Medication refills sent in and pt daughter contacted

## 2018-08-12 ENCOUNTER — Other Ambulatory Visit: Payer: Self-pay

## 2018-08-12 ENCOUNTER — Telehealth: Payer: Self-pay | Admitting: Family Medicine

## 2018-08-12 MED ORDER — DOXAZOSIN MESYLATE 4 MG PO TABS: ORAL_TABLET | ORAL | 0 refills | Status: DC

## 2018-08-12 NOTE — Telephone Encounter (Signed)
Pt's daughter is calling requesting refill on doxazosin (CARDURA) 4 MG tablet.   [id number] [place] [facility] [place]

## 2018-08-12 NOTE — Telephone Encounter (Signed)
Please advise. Thank you

## 2018-08-12 NOTE — Telephone Encounter (Signed)
Doxazosin currently not on his med list Please talk with the daughter confirmed that the patient is taking that on a regular basis If he is in fact taking it on a regular basis they may have 3 months of a refill and I would recommend a virtual follow-up visit sometime this summer

## 2018-08-12 NOTE — Telephone Encounter (Signed)
Contacted patient daughter. Pt takes Doxazosin on a regular basis at bedtime. Sent in 3 months and informed daughter that provider recommended a follow up virtually this summer. Daughter verbalized understanding.

## 2018-08-18 DIAGNOSIS — G4733 Obstructive sleep apnea (adult) (pediatric): Secondary | ICD-10-CM | POA: Diagnosis not present

## 2018-08-25 ENCOUNTER — Telehealth: Payer: Self-pay | Admitting: Internal Medicine

## 2018-08-25 NOTE — Telephone Encounter (Signed)
Daughter called back to say patient uses Paediatric nurse at [place]

## 2018-08-25 NOTE — Telephone Encounter (Signed)
Returned pts call. [facility] [place] [facility] needs to [facility] to. He's going to have his daughter call back with the correct pharmacy.

## 2018-08-25 NOTE — Telephone Encounter (Signed)
Pt is aware of his OV tomorrow at 230pm. He said that he is living in [place] now and has switched pharmacies. He said he [facility] in [place] for his stomach medicine (Colazal 750 mg). He wants it called in today because he is out.

## 2018-08-26 ENCOUNTER — Encounter: Payer: Self-pay | Admitting: Nurse Practitioner

## 2018-08-26 ENCOUNTER — Other Ambulatory Visit: Payer: Self-pay

## 2018-08-26 ENCOUNTER — Ambulatory Visit: Payer: Medicare HMO | Admitting: Nurse Practitioner

## 2018-08-26 VITALS — BP 144/74 | HR 62 | Temp 96.9°F | Ht 74.0 in | Wt 294.6 lb

## 2018-08-26 DIAGNOSIS — R197 Diarrhea, unspecified: Secondary | ICD-10-CM

## 2018-08-26 DIAGNOSIS — K5190 Ulcerative colitis, unspecified, without complications: Secondary | ICD-10-CM | POA: Diagnosis not present

## 2018-08-26 DIAGNOSIS — K625 Hemorrhage of anus and rectum: Secondary | ICD-10-CM

## 2018-08-26 MED ORDER — BALSALAZIDE DISODIUM 750 MG PO CAPS: 2250.0000 mg | ORAL_CAPSULE | Freq: Three times a day (TID) | ORAL | 3 refills | Status: DC

## 2018-08-26 NOTE — Assessment & Plan Note (Signed)
History of left-sided proctocolitis with significant challenges getting him on a medication regimen due to financial constraints and insurance issues.  He was finally approved for Colazal through patient assistance with cover my meds.  He is still having frequent diarrhea.  However, it was discovered he was not taking his medication as recommended.  He was only taking it twice a day rather than 3 times a day.  Before moving forward with more testing I would like him to get on the appropriate dose of medication and see if this makes any difference with his symptoms.  A refill was sent to his pharmacy and I triple check that the dosing and number of pills was correct.  His daughter is with him who will help ensure he is taking the right amount of medications.  Follow-up in 2 months.  Continue Imodium as well.  Can take Imodium prior to meals to help prevent postprandial diarrhea.

## 2018-08-26 NOTE — Assessment & Plan Note (Signed)
Frequent diarrhea and fecal incontinence as well as urinary incontinence in the setting of left-sided proctocolitis difficult to manage.  He is not taking his medication as prescribed.  I feel this could be contributing to his continued symptoms.  I have reviewed with him and his daughter how to take the medication.  Can continue using Imodium as well, especially prior to meals to prevent postprandial diarrhea.  Further testing and evaluation depending on clinical response to correct dose of medication.  Follow-up in 2 months.

## 2018-08-26 NOTE — Assessment & Plan Note (Signed)
The patient notes low-volume toilet tissue hematochezia with most bowel movements.  Colonoscopy is up-to-date.  He does have left-sided proctocolitis difficult to manage, as per above.  Further management as per above.  He also is likely having anorectal irritation with frequent stools.  I feel once we are able to get his symptoms under better control his rectal bleeding will likely subside or improve.  Can consider using rectal cream to help in the interim.

## 2018-08-26 NOTE — Progress Notes (Signed)
Referring Provider: [name], MD Primary Care Physician:  [name], MD Primary GI:  Dr. Gala Romney  Chief Complaint  Patient presents with  . Encopresis    have accidents all day long, going on for a while  . Rectal Bleeding    daily  . Gas    wants to know if he may have a "leaky gut"    HPI:   [name] is a 78 y.o. male who presents for bowel incontinence.  Patient was last seen in our office 10/23/2017 for left-sided ulcerative colitis without complication.  Noted history of left-sided proctocolitis last seen in May 2019 prior to his last visit.  Difficult to manage historically due to lack of prescription coverage.  Previously treated with compounded Entocort through [employer].  At his last visit denied rectal bleeding.  Was having fecal urgency and intermittent loose stool taking "diarrhea pills."  Some mild to moderate dairy intake. After review noted Colazal was preferred and does not have anyone to help him place suppositories or retention enemas.  Recent colonoscopy with active left-sided proctocolitis.  Query some component of dietary intake that is affecting him, specifically lactulose, but ultimately ill would be improved with the addition of topical therapy although he would have difficulty in using this.  Recommend he complete final 4 weeks of Entocort to bridge him to Colazal which was sent to his pharmacy.  Follow-up in 3 months or sooner if needed.  Colazal was sent to the pharmacy even no insurance benefit check indicated no agents covered.  Query possible tier exception.  In October 2019 patient received a letter indicating financial assistance for Uceris have been denied.  The patient was admitted to [facility] from 03/14/2021 to 12/26/2018 for acute hypoxic respiratory failure due to chronic diastolic CHF.  At that time his proctocolitis symptoms were deemed to be currently mild to moderate, noted complications by limited adherence  and financial constraints/insurance limitations. Upcoming March appointment noted at that time.  Submission through cover my meds was approved through December 2020 and patient was notified.  The prescription was sent to [facility] in [place].  Unfortunately in April the patient was a no-show to his follow-up visit.  The patient called yesterday indicating he is out of Colazal and needs a new prescription sent to a new pharmacy as he is currently living in [place] now.  Today his daughter is with him. Today they state he's had incontinence for at least 10 years. Colazal doesn't seem to be helping. If he eats anything he has to have a bowel movement in 10-15 minutes. Unable to control his bowel movements and has fecal incontinence. Has been wearing depends. Happens in his sleep as well. Also with urinary incontinence. Has a bowel movement 10-15 times a day. Cant go anywhere. Still with hematochezia about every time he has a bowel movement. Isn't sure if it's related to his hemorrhoids. Denies melena. Denies abdominal pain, N/V, fever, chills, unintentional weight loss. Denies chest pain, dyspnea, dizziness, lightheadedness, syncope, near syncope. Denies any other upper or lower GI symptoms.  It turns out he's not taking Colazal as recommended. Is only taking it twice a day. Restated instructions to take 3 pills three times a day. Refill sent.  Past Medical History:  Diagnosis Date  . Asthma   . BPH (benign prostatic hyperplasia)   . CAD (coronary artery disease) 01/1995  . Clostridium difficile colitis 04/2005  . Colitis 2011  . COPD (chronic  obstructive pulmonary disease) ([place])   . Depression   . Diverticulosis   . DJD (degenerative joint disease)   . Gastric ulcer 04/17/10   Three 19m gastric ulcers, H.pylori serologies were negative  . GERD (gastroesophageal reflux disease)   . History of kidney stones   . Hyperlipidemia   . Hypertension   . Idiopathic chronic inflammatory  bowel disease 05/18/2010   left-sided UC  . Kidney stone   . Morbid obesity ([place] 03/12/2018  . Obstructive sleep apnea   . Reflux 02/1995  . S/P endoscopy 07/24/10   retained gastric contents, benign bx    Past Surgical History:  Procedure Laterality Date  . ANORECTAL MANOMETRY  2016   [facility]: concern for possible fissure. Noted pelvic floor dyssnyergy  . BIOPSY  07/29/2017   Procedure: BIOPSY;  Surgeon: RDaneil Dolin MD;  Location: [facility];  Service: Endoscopy;;  ascending and sigmoid colon  . CARDIAC CATHETERIZATION     with stent  . CARDIOVASCULAR STRESS TEST  07/21/2009   No scintigraphic evidence of inducible myocardial ischemia  . CARPAL TUNNEL RELEASE Left 01/17/2017   Procedure: LEFT CARPAL TUNNEL RELEASE;  Surgeon: HCarole Civil MD;  Location: [facility] ORS;  Service: Orthopedics;  Laterality: Left;  . CATARACT EXTRACTION, BILATERAL Bilateral   . CERVICAL SPINE SURGERY     C4-5  . COLONOSCOPY  04/2005   granularity and friability erosions from rectum to 40cm. Bx infection vs IBD. C. Diff positive at the time.   . COLONOSCOPY  05/2010   [facility]: left-sided UC, bx with no dysplasia, shallow diverticula  . COLONOSCOPY [name] 11/07/2012   RRSW:NIOE-VOJJKproctocolitis status post segmental biopsy/Sigmoid colon polyps removed as described above. Procedure compromisd by technical difficulties. bx: Inflammation limited to sigmoid and rectum on pathology.  . COLONOSCOPY  04/2014   [facility] well localized proctocolitis limited to sigmoid  . COLONOSCOPY WITH PROPOFOL [name] 07/29/2017   diverticulosis in colon, three 4-6 mm polyps at splenic flexure and in cecum, one 10 mm polyp in rectum, abnormal rectum and sigmoid consistent with active UC  . CORONARY STENT PLACEMENT  01/1995  . ESOPHAGOGASTRODUODENOSCOPY  07/24/2010   RKXF:GHWEXHesophagus  . ESOPHAGOGASTRODUODENOSCOPY  04/2014   [facility] small bowel biopsies  . FOOT SURGERY Bilateral two  . KNEE  SURGERY  two  . POLYPECTOMY  07/29/2017   Procedure: POLYPECTOMY;  Surgeon: RDaneil Dolin MD;  Location: [facility];  Service: Endoscopy;;  splenic flexure, ascending colon polyp;rectal  . SHOULDER SURGERY  two  . TONSILLECTOMY    . TRANSTHORACIC ECHOCARDIOGRAM  03/23/2009   EF 60-65%, normal LV systolic function  . ULNAR HEAD EXCISION Left 01/17/2017   Procedure: LEFT ULNAR HEAD RESECTION;  Surgeon: HCarole Civil MD;  Location: [facility] ORS;  Service: Orthopedics;  Laterality: Left;    Current Outpatient Medications  Medication Sig Dispense Refill  . acetaminophen (TYLENOL) 325 MG tablet Take 650 mg by mouth every 6 (six) hours as needed for moderate pain or headache.    .Marland KitchenamLODipine (NORVASC) 2.5 MG tablet Take 1 tablet (2.5 mg total) by mouth every morning. 30 tablet 5  . aspirin 81 MG tablet Take 1 tablet (81 mg total) by mouth at bedtime. 30 tablet   . balsalazide (COLAZAL) 750 MG capsule Take 3 capsules (2,250 mg total) by mouth 3 (three) times daily. 270 capsule 3  . doxazosin (CARDURA) 4 MG tablet Take one tablet by mouth at bedtime 90 tablet  0  . furosemide (LASIX) 40 MG tablet Take 1 tablet (40 mg total) by mouth every morning. (Patient taking differently: Take 20 mg by mouth every morning. ) 30 tablet 6  . loperamide (IMODIUM) 2 MG capsule Take 2 mg by mouth daily.    Marland Kitchen losartan (COZAAR) 25 MG tablet Take 1 tablet (25 mg total) by mouth every morning. 90 tablet 0  . metoprolol tartrate (LOPRESSOR) 50 MG tablet One po QHS (Patient taking differently: Take 50 mg by mouth at bedtime. One po QHS) 30 tablet 5  . nitroGLYCERIN (NITROSTAT) 0.4 MG SL tablet Place 0.4 mg under the tongue every 5 (five) minutes as needed for chest pain.    . pravastatin (PRAVACHOL) 40 MG tablet Take 1 tablet (40 mg total) by mouth at bedtime. 90 tablet 0  . sertraline (ZOLOFT) 50 MG tablet Take 1 tablet (50 mg total) by mouth daily. 30 tablet 5  . triamcinolone cream (KENALOG) 0.1 % Apply twice daily  as needed for psoriasis. 45 g 0   No current facility-administered medications for this visit.     Allergies as of 08/26/2018  . (No Known Allergies)    Family History  Problem Relation Age of Onset  . Lung cancer Mother   . Cancer Mother        breast  . Diabetes Mother   . Stroke Father   . Hypertension Father   . Kidney failure Brother   . Other Child        blood infection  . Colon cancer Neg Hx     Social History   Socioeconomic History  . Marital status: Widowed    Spouse name: Not on file  . Number of children: Not on file  . Years of education: Not on file  . Highest education level: Not on file  Occupational History  . 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 Financial resource strain: Not on file  . Food insecurity    Worry: Not on file    Inability: Not on file  . Transportation needs    Medical: Not on file    Non-medical: Not on file  Tobacco Use  . Smoking status: Former Smoker    Packs/day: 1.00    Years: 20.00    Pack years: 20.00    Types: Cigarettes    Quit date: 09/30/1969    Years since quitting: 48.9  . Smokeless tobacco: Never Used  Substance and Sexual Activity  . Alcohol use: No  . Drug use: No  . Sexual activity: Yes    Partners: Female    Birth control/protection: None    Comment: spouse  Lifestyle  . Physical activity    Days per week: Not on file    Minutes per session: Not on file  . Stress: Not on file  Relationships  . Social Herbalist on phone: Not on file    Gets together: Not on file    Attends religious service: Not on file    Active member of club or organization: Not on file    Attends meetings of clubs or organizations: Not on file    Relationship status: Not on file  Other Topics Concern  . Not on file  Social History Narrative  . Not on file    Review of Systems: General: Negative for anorexia, weight loss, fever, chills, fatigue, weakness. Eyes: Negative for vision  changes.  ENT: Negative for hoarseness, difficulty swallowing ,  nasal congestion. CV: Negative for chest pain, angina, palpitations, dyspnea on exertion, peripheral edema.  Respiratory: Negative for dyspnea at rest, dyspnea on exertion, cough, sputum, wheezing.  GI: See history of present illness. GU:  Negative for dysuria, hematuria, urinary incontinence, urinary frequency, nocturnal urination.  MS: Negative for joint pain, low back pain.  Derm: Negative for rash or itching.  Neuro: Negative for weakness, abnormal sensation, seizure, frequent headaches, memory loss, confusion.  Psych: Negative for anxiety, depression, suicidal ideation, hallucinations.  Endo: Negative for unusual weight change.  Heme: Negative for bruising or bleeding. Allergy: Negative for rash or hives.   Physical Exam: BP (!) 144/74   Pulse 62   Temp (!) 96.9 F (36.1 C) (Oral)   Ht 6' 2"  (1.88 m)   Wt 294 lb 9.6 oz (133.6 kg)   BMI 37.82 kg/m  General:   Alert and oriented. Pleasant and cooperative. Well-nourished and well-developed.  Head:  Normocephalic and atraumatic. Eyes:  Without icterus, sclera clear and conjunctiva pink.  Ears:  Normal auditory acuity. Mouth:  No deformity or lesions, oral mucosa pink.  Throat/Neck:  Supple, without mass or thyromegaly. Cardiovascular:  S1, S2 present without murmurs appreciated. Normal pulses noted. Extremities without clubbing or edema. Respiratory:  Clear to auscultation bilaterally. No wheezes, rales, or rhonchi. No distress.  Gastrointestinal:  +BS, soft, non-tender and non-distended. No HSM noted. No guarding or rebound. No masses appreciated.  Rectal:  Deferred  Musculoskalatal:  Symmetrical without gross deformities. Normal posture. Skin:  Intact without significant lesions or rashes. Neurologic:  Alert and oriented x4;  grossly normal neurologically. Psych:  Alert and cooperative. Normal mood and affect. Heme/Lymph/Immune: No significant cervical  adenopathy. No excessive bruising noted.    08/26/2018 3:38 PM   Disclaimer: This note was dictated with voice recognition software. Similar sounding words can inadvertently be transcribed and may not be corrected upon review.

## 2018-08-26 NOTE — Patient Instructions (Signed)
Your health issues we discussed today were:   Inflammatory bowel disease with frequent stools and rectal bleeding: 1. I have sent a refill of Colazal to your pharmacy. 2. Take 3 pills, 3 times a day.  I have sent a 90-day supply with 3 refills 3. Call us if you have any worsening or severe symptoms  Rectal bleeding: 1. I feel your rectal bleeding is likely caused by multiple things including the inflammatory bowel disease as well as likely rectal irritation due to frequent loose stools 2. You can try Preparation H over-the-counter to see if this helps with the rectal bleeding 3. If you would like, call our office and we can send in a prescription rectal cream to your pharmacy, although I am not sure if you discover how much it would cost 4. Call us for any worsening or severe bleeding  Overall I recommend:  1. Continue other current medications 2. Return for follow-up in 2 months 3. Call us if you have any questions or concerns.   Because of recent events of COVID-19 ("Coronavirus"), follow CDC recommendations:  1. Wash your hand frequently 2. Avoid touching your face 3. Stay away from people who are sick 4. If you have symptoms such as fever, cough, shortness of breath then call your healthcare provider for further guidance 5. If you are sick, STAY AT HOME unless otherwise directed by your healthcare provider. 6. Follow directions from state and national officials regarding staying safe   At [facility] we value your feedback. You may receive a survey about your visit today. Please share your experience as we strive to create trusting relationships with our patients to provide genuine, compassionate, quality care.  We appreciate your understanding and patience as we review any laboratory studies, imaging, and other diagnostic tests that are ordered as we care for you. Our office policy is 5 business days for review of these results, and any emergent or urgent results  are addressed in a timely manner for your best interest. If you do not hear from our office in 1 week, please contact us.   We also encourage the use of MyChart, which contains your medical information for your review as well. If you are not enrolled in this feature, an access code is on this after visit summary for your convenience. Thank you for allowing [place] to be involved in your care.  It was great to see you today!  I hope you have a great summer!!

## 2018-08-26 NOTE — Telephone Encounter (Signed)
Pt has an office visit today. [facility] which [facility] sent [facility] at his apt.

## 2018-08-27 ENCOUNTER — Encounter: Payer: Self-pay | Admitting: Internal Medicine

## 2018-08-27 NOTE — Progress Notes (Signed)
cc'ed to pcp °

## 2018-08-28 DIAGNOSIS — H6123 Impacted cerumen, bilateral: Secondary | ICD-10-CM | POA: Diagnosis not present

## 2018-09-02 ENCOUNTER — Telehealth: Payer: Self-pay | Admitting: *Deleted

## 2018-09-02 NOTE — Telephone Encounter (Signed)
Script written out; awaiting signature

## 2018-09-02 NOTE — Telephone Encounter (Signed)
That would be fine to go ahead and write this out and I will sign it but it can be faxed thank you

## 2018-09-02 NOTE — Telephone Encounter (Signed)
Home health needs a new order faxed for CPAP that has his pressure on it . His current pressure is 15.  Please fax new order with the setting on it to [phone]

## 2018-09-02 NOTE — Telephone Encounter (Signed)
Script printed and faxed to [facility] (looked back in chart to get fax number)

## 2018-09-03 ENCOUNTER — Telehealth: Payer: Self-pay | Admitting: Family Medicine

## 2018-09-03 NOTE — Telephone Encounter (Signed)
New CPAP and supplies added to script and faxed by [facility]

## 2018-09-03 NOTE — Telephone Encounter (Signed)
Home care never got fax from [place] yesterday.  Both fax #'s we had were incorrect.  Needs the order faxed to [phone]. Order needs to say new cpap plus supplies. Order placed at nurses desk.

## 2018-09-05 DIAGNOSIS — G4733 Obstructive sleep apnea (adult) (pediatric): Secondary | ICD-10-CM | POA: Diagnosis not present

## 2018-09-08 ENCOUNTER — Telehealth: Payer: Self-pay | Admitting: Family Medicine

## 2018-09-08 NOTE — Telephone Encounter (Signed)
Pt's daughter [name] calling to see if pt should still be taking amLODipine (NORVASC) 2.5 MG tablet. He needs a refill please send to [facility] ([facility] [place]

## 2018-09-11 ENCOUNTER — Telehealth: Payer: Self-pay | Admitting: Family Medicine

## 2018-09-11 MED ORDER — AMLODIPINE BESYLATE 2.5 MG PO TABS: 2.5000 mg | ORAL_TABLET | Freq: Every morning | ORAL | 5 refills | Status: DC

## 2018-09-11 NOTE — Telephone Encounter (Signed)
Prescription sent electronically to pharmacy. Daughter (DPR) notified and stated she already has scheduled a virtual follow up for patient 10/07/2018

## 2018-09-11 NOTE — Telephone Encounter (Signed)
Daughter came by to check on prescription for Norvasc.  Said they called Monday and he is now out.

## 2018-09-11 NOTE — Telephone Encounter (Signed)
May have 30-day of amlodipine with 2 refills It would be helpful if they are checking his blood pressure at home and they can let us know what those readings are I would also recommend at the very least a virtual visit with him either in August or early September it would be wise to go ahead and schedule If they would prefer to do an office visit this is possible as long as it is scheduled and not having COVID symptoms-currently we are scheduled through this week full

## 2018-09-16 ENCOUNTER — Telehealth: Payer: Self-pay | Admitting: Nutrition

## 2018-09-16 NOTE — Telephone Encounter (Signed)
Vm left on daughters phone to call to discuss follow up visit with me via phone or someone in the [place] office.

## 2018-09-18 DIAGNOSIS — G4733 Obstructive sleep apnea (adult) (pediatric): Secondary | ICD-10-CM | POA: Diagnosis not present

## 2018-10-06 ENCOUNTER — Other Ambulatory Visit: Payer: Self-pay

## 2018-10-06 DIAGNOSIS — C44519 Basal cell carcinoma of skin of other part of trunk: Secondary | ICD-10-CM | POA: Diagnosis not present

## 2018-10-06 DIAGNOSIS — G4733 Obstructive sleep apnea (adult) (pediatric): Secondary | ICD-10-CM | POA: Diagnosis not present

## 2018-10-07 ENCOUNTER — Ambulatory Visit: Payer: Medicare HMO | Admitting: Family Medicine

## 2018-10-07 ENCOUNTER — Other Ambulatory Visit: Payer: Self-pay

## 2018-10-09 ENCOUNTER — Other Ambulatory Visit: Payer: Self-pay | Admitting: Family Medicine

## 2018-10-09 NOTE — Telephone Encounter (Signed)
Needs virtual follow-up here in person may have this +1 refill

## 2018-10-09 NOTE — Telephone Encounter (Signed)
May have 1 refill needs virtual follow-up or in person

## 2018-10-10 NOTE — Telephone Encounter (Signed)
Please see previous message 1 refill, needs virtual visit

## 2018-10-10 NOTE — Telephone Encounter (Signed)
Please see previous message may do 1 refill to schedule follow-up virtual visit

## 2018-10-10 NOTE — Telephone Encounter (Signed)
Has appt 10/1. Refill sent

## 2018-10-19 DIAGNOSIS — G4733 Obstructive sleep apnea (adult) (pediatric): Secondary | ICD-10-CM | POA: Diagnosis not present

## 2018-10-22 DIAGNOSIS — M436 Torticollis: Secondary | ICD-10-CM | POA: Diagnosis not present

## 2018-10-22 DIAGNOSIS — M4810 Ankylosing hyperostosis [Forestier], site unspecified: Secondary | ICD-10-CM | POA: Insufficient documentation

## 2018-10-22 DIAGNOSIS — M542 Cervicalgia: Secondary | ICD-10-CM | POA: Insufficient documentation

## 2018-10-28 ENCOUNTER — Ambulatory Visit: Payer: Medicare HMO | Admitting: Gastroenterology

## 2018-11-05 ENCOUNTER — Other Ambulatory Visit: Payer: Self-pay | Admitting: Family Medicine

## 2018-11-05 DIAGNOSIS — G4733 Obstructive sleep apnea (adult) (pediatric): Secondary | ICD-10-CM | POA: Diagnosis not present

## 2018-11-06 ENCOUNTER — Encounter: Payer: Self-pay | Admitting: Family Medicine

## 2018-11-06 ENCOUNTER — Ambulatory Visit (INDEPENDENT_AMBULATORY_CARE_PROVIDER_SITE_OTHER): Payer: Medicare Other | Admitting: Family Medicine

## 2018-11-06 ENCOUNTER — Other Ambulatory Visit: Payer: Self-pay

## 2018-11-06 VITALS — BP 124/60 | Temp 99.2°F | Wt 298.2 lb

## 2018-11-06 DIAGNOSIS — K5190 Ulcerative colitis, unspecified, without complications: Secondary | ICD-10-CM

## 2018-11-06 DIAGNOSIS — I5032 Chronic diastolic (congestive) heart failure: Secondary | ICD-10-CM | POA: Diagnosis not present

## 2018-11-06 DIAGNOSIS — N401 Enlarged prostate with lower urinary tract symptoms: Secondary | ICD-10-CM | POA: Diagnosis not present

## 2018-11-06 DIAGNOSIS — R7303 Prediabetes: Secondary | ICD-10-CM

## 2018-11-06 DIAGNOSIS — I10 Essential (primary) hypertension: Secondary | ICD-10-CM

## 2018-11-06 DIAGNOSIS — Z0000 Encounter for general adult medical examination without abnormal findings: Secondary | ICD-10-CM

## 2018-11-06 DIAGNOSIS — J439 Emphysema, unspecified: Secondary | ICD-10-CM | POA: Diagnosis not present

## 2018-11-06 DIAGNOSIS — E785 Hyperlipidemia, unspecified: Secondary | ICD-10-CM

## 2018-11-06 DIAGNOSIS — M450 Ankylosing spondylitis of multiple sites in spine: Secondary | ICD-10-CM

## 2018-11-06 DIAGNOSIS — Z23 Encounter for immunization: Secondary | ICD-10-CM

## 2018-11-06 MED ORDER — METOPROLOL TARTRATE 50 MG PO TABS: ORAL_TABLET | ORAL | 5 refills | Status: DC

## 2018-11-06 MED ORDER — MUPIROCIN 2 % EX OINT: TOPICAL_OINTMENT | CUTANEOUS | 0 refills | Status: DC

## 2018-11-06 MED ORDER — PRAVASTATIN SODIUM 40 MG PO TABS: 40.0000 mg | ORAL_TABLET | Freq: Every day | ORAL | 5 refills | Status: DC

## 2018-11-06 MED ORDER — SHINGRIX 50 MCG/0.5ML IM SUSR: 0.5000 mL | Freq: Once | INTRAMUSCULAR | 1 refills | Status: AC

## 2018-11-06 MED ORDER — AMLODIPINE BESYLATE 2.5 MG PO TABS: 2.5000 mg | ORAL_TABLET | Freq: Every morning | ORAL | 5 refills | Status: DC

## 2018-11-06 MED ORDER — SPIRIVA HANDIHALER 18 MCG IN CAPS: ORAL_CAPSULE | RESPIRATORY_TRACT | 5 refills | Status: DC

## 2018-11-06 MED ORDER — LOSARTAN POTASSIUM 25 MG PO TABS: ORAL_TABLET | ORAL | 5 refills | Status: DC

## 2018-11-06 MED ORDER — DOXAZOSIN MESYLATE 4 MG PO TABS: ORAL_TABLET | ORAL | 5 refills | Status: DC

## 2018-11-06 MED ORDER — SERTRALINE HCL 50 MG PO TABS: ORAL_TABLET | ORAL | 5 refills | Status: DC

## 2018-11-06 NOTE — Progress Notes (Addendum)
Subjective:    Patient ID: [name], male    DOB: 1940-03-02, 78 y.o.   MRN: [id number]  HPI AWV- Annual Wellness Visit Here today for annual wellness exam Unfortunately this patient has multitudes of chronic health issues I certainly wish the best for him but it is very difficult He has multiple questions regarding these which were addressed in addition to his annual wellness His daughter was with him today.  They live in [place] currently. The patient was seen for their annual wellness visit. The patient's past medical history, surgical history, and family history were reviewed. Pertinent vaccines were reviewed ( tetanus, pneumonia, shingles, flu) The patient's medication list was reviewed and updated.  The height and weight were entered.  BMI recorded in electronic record elsewhere Patient diagnosed with ankylosing spondylitis by his neurosurgeon both in the lower back and upper neck positive HLA patient trying to do the best he can sometimes has to use a cane or walker Cognitive screening was completed. Outcome of Mini - Cog: pass   Falls /depression screening electronically recorded within record elsewhere  Current tobacco usage: none (All patients who use tobacco were given written and verbal information on quitting)  Recent listing of emergency department/hospitalizations over the past year were reviewed.  current specialist the patient sees on a regular basis: urologist, GI   Medicare annual wellness visit patient questionnaire was reviewed.  A written screening schedule for the patient for the next 5-10 years was given. Appropriate discussion of followup regarding next visit was discussed.  Pt would like another referral or a 2nd opinion  regarding diverticulitis. Pt states bowels are formed but sometimes runny; taking 2-3 Imodium sometimes.  Upon further discussions not diverticulitis since actually the colitis.  I explained to them what ulcerative colitis  was.  I explained to them the difficulties of controlling it and they would like to have a gastroenterologist closer to where they live.  His daughter goes to [facility] therefore they would like to have an appointment with them.   Pt daughter is wanting to know if there any meds for COPD.  He was diagnosed with COPD a while back.  His CAT scan confirmed it.  He does get short of breath with activity.  He would like to be on an inhaler of some sort and might help.  He denies any significant wheezing denies high fever chills sweats nausea vomiting   We also discussed COVID protection.  Patient does have BPH problems has intermittent urinary frequency issues does use doxazosin for this and states that it does do a good job of helping him  Patient has hyperlipidemia and takes his medication tries to watch diet is interested in getting labs checked to look at this.  Patient does have high blood pressure takes his medicine minimizes salt stays physically active needs refills and will need some lab work  This patient also has underlying heart disease with chronic diastolic CHF.  We did discuss the importance of watching weight watching diet watching salt and staying active we also discussed doing some walking on a regular basis.  Pt had a cancerous mole removed from his back on August 31 and would like Dr.Scott to look at it.     Review of Systems  Constitutional: Negative for diaphoresis and fatigue.  HENT: Negative for congestion and rhinorrhea.   Respiratory: Negative for cough and shortness of breath.   Cardiovascular: Negative for chest pain and leg swelling.  Gastrointestinal: Negative for abdominal pain  and diarrhea.  Skin: Negative for color change and rash.  Neurological: Negative for dizziness and headaches.  Psychiatric/Behavioral: Negative for behavioral problems and confusion.       Objective:   Physical Exam Vitals signs reviewed.  Constitutional:      General: He is not in  acute distress. HENT:     Head: Normocephalic and atraumatic.  Eyes:     General:        Right eye: No discharge.        Left eye: No discharge.  Neck:     Trachea: No tracheal deviation.  Cardiovascular:     Rate and Rhythm: Normal rate and regular rhythm.     Heart sounds: Normal heart sounds. No murmur.  Pulmonary:     Effort: Pulmonary effort is normal. No respiratory distress.     Breath sounds: Normal breath sounds.  Lymphadenopathy:     Cervical: No cervical adenopathy.  Skin:    General: Skin is warm and dry.  Neurological:     Mental Status: He is alert.     Coordination: Coordination normal.  Psychiatric:        Behavior: Behavior normal.           Assessment & Plan:  1. Ulcerative colitis without complications, unspecified location [facility]) We will refer to [place] patient now lives in [place] would like to have a gastroenterologist in [place]. - Ambulatory referral to Gastroenterology  2. Morbid obesity ([place]) Has morbid obesity.  Will try to watch diet try to lose weight very difficult for this patient because of his immobility. - CBC with Differential - Lipid Profile - Hepatic function panel - Basic Metabolic Panel (BMET) - B Nat Peptide - Ambulatory referral to Gastroenterology  3. Pulmonary emphysema, unspecified emphysema type ([place]) Certainly has emphysema we will try Spiriva on a daily basis if not having any significant improvement he will let us know in the next few weeks virtual visit in 4 to 6 weeks - CBC with Differential - Lipid Profile - Hepatic function panel - Basic Metabolic Panel (BMET) - B Nat Peptide - Ambulatory referral to Gastroenterology  4. Chronic diastolic CHF (congestive heart failure) ([name]) Currently I think his CHF under good control no significant edema he will take his diuretic and potassium watch salt in diet weigh himself on a regular basis - CBC with Differential - Lipid Profile - Hepatic function panel - Basic  Metabolic Panel (BMET) - B Nat Peptide - Ambulatory referral to Gastroenterology  5. Hyperlipidemia, unspecified hyperlipidemia type Hyperlipidemia he will take his medication watch diet stay active check labs previous labs reviewed - CBC with Differential - Lipid Profile - Hepatic function panel - Basic Metabolic Panel (BMET) - B Nat Peptide - Ambulatory referral to Gastroenterology  6. Need for vaccination Pneumococcal 23 given today - Pneumococcal polysaccharide vaccine 23-valent greater than or equal to 2yo subcutaneous/IM  7. Benign prostatic hyperplasia with lower urinary tract symptoms, symptom details unspecified BPH I do not feel he has any type of cancer here continue his doxazosin  8. Essential hypertension Blood pressure good control takes medication watch his diet closely.  Minimizes salt blood pressure decent control  9. Prediabetes Has a history of prediabetes.  Watch starches in diet and stay active  10. Dyslipidemia, goal LDL below 70 Hyperlipidemia very important for patient to get this under good control previous labs look good new labs ordered on medication  Adult wellness-complete.wellness physical was conducted today. Importance of diet and exercise were  discussed in detail.  In addition to this a discussion regarding safety was also covered. We also reviewed over immunizations and gave recommendations regarding current immunization needed for age.  In addition to this additional areas were also touched on including: Preventative health exams needed:  Colonoscopy colonoscopy not indicated.  Patient was advised yearly wellness exam  25 minutes was spent with the patient.  This statement verifies that 25 minutes was indeed spent with the patient.  More than 50% of this visit-total duration of the visit-was spent in counseling and coordination of care. The issues that the patient came in for today as reflected in the diagnosis (s) please refer to documentation  for further details.  Patient also has sleep apnea.  Patient states he is using his machine on a regular basis.  Using it greater than 4 hours on a nightly basis.  He has seen improvement in regards to better stamina during the day.  Less shortness of breath.  Patient benefits from the use of the machine and has been recommended for him to continue it We are in the process of setting him up with a pulmonary doctor who will manage his COPD as well as his sleep apnea

## 2018-11-07 ENCOUNTER — Encounter: Payer: Self-pay | Admitting: Family Medicine

## 2018-11-07 DIAGNOSIS — J439 Emphysema, unspecified: Secondary | ICD-10-CM | POA: Diagnosis not present

## 2018-11-07 DIAGNOSIS — E785 Hyperlipidemia, unspecified: Secondary | ICD-10-CM | POA: Diagnosis not present

## 2018-11-07 DIAGNOSIS — M459 Ankylosing spondylitis of unspecified sites in spine: Secondary | ICD-10-CM

## 2018-11-07 DIAGNOSIS — I5032 Chronic diastolic (congestive) heart failure: Secondary | ICD-10-CM | POA: Diagnosis not present

## 2018-11-07 HISTORY — DX: Ankylosing spondylitis of unspecified sites in spine: M45.9

## 2018-11-08 LAB — LIPID PANEL
Chol/HDL Ratio: 2.8 ratio (ref 0.0–5.0)
Cholesterol, Total: 137 mg/dL (ref 100–199)
HDL: 49 mg/dL (ref >39)
LDL Chol Calc (NIH): 66 mg/dL (ref 0–99)
Triglycerides: 126 mg/dL (ref 0–149)
VLDL Cholesterol Cal: 22 mg/dL (ref 5–40)

## 2018-11-08 LAB — CBC WITH DIFFERENTIAL/PLATELET
Basophils Absolute: 0.1 10*3/uL (ref 0.0–0.2)
Basos: 1 %
EOS (ABSOLUTE): 0.3 10*3/uL (ref 0.0–0.4)
Eos: 3 %
Hematocrit: 42.5 % (ref 37.5–51.0)
Hemoglobin: 14.2 g/dL (ref 13.0–17.7)
Immature Grans (Abs): 0 10*3/uL (ref 0.0–0.1)
Immature Granulocytes: 0 %
Lymphocytes Absolute: 3 10*3/uL (ref 0.7–3.1)
Lymphs: 32 %
MCH: 29.3 pg (ref 26.6–33.0)
MCHC: 33.4 g/dL (ref 31.5–35.7)
MCV: 88 fL (ref 79–97)
Monocytes Absolute: 0.8 10*3/uL (ref 0.1–0.9)
Monocytes: 8 %
Neutrophils Absolute: 5.3 10*3/uL (ref 1.4–7.0)
Neutrophils: 56 %
Platelets: 213 10*3/uL (ref 150–450)
RBC: 4.84 x10E6/uL (ref 4.14–5.80)
RDW: 13.1 % (ref 11.6–15.4)
WBC: 9.5 10*3/uL (ref 3.4–10.8)

## 2018-11-08 LAB — HEPATIC FUNCTION PANEL
ALT: 12 IU/L (ref 0–44)
AST: 18 IU/L (ref 0–40)
Albumin: 3.9 g/dL (ref 3.7–4.7)
Alkaline Phosphatase: 84 IU/L (ref 39–117)
Bilirubin Total: 0.5 mg/dL (ref 0.0–1.2)
Bilirubin, Direct: 0.16 mg/dL (ref 0.00–0.40)
Total Protein: 6.8 g/dL (ref 6.0–8.5)

## 2018-11-08 LAB — BASIC METABOLIC PANEL
BUN/Creatinine Ratio: 15 (ref 10–24)
BUN: 15 mg/dL (ref 8–27)
CO2: 28 mmol/L (ref 20–29)
Calcium: 9.7 mg/dL (ref 8.6–10.2)
Chloride: 107 mmol/L — ABNORMAL HIGH (ref 96–106)
Creatinine, Ser: 1 mg/dL (ref 0.76–1.27)
GFR calc Af Amer: 82 mL/min/{1.73_m2} (ref >59)
GFR calc non Af Amer: 71 mL/min/{1.73_m2} (ref >59)
Glucose: 101 mg/dL — ABNORMAL HIGH (ref 65–99)
Potassium: 4 mmol/L (ref 3.5–5.2)
Sodium: 146 mmol/L — ABNORMAL HIGH (ref 134–144)

## 2018-11-08 LAB — BRAIN NATRIURETIC PEPTIDE: BNP: 93.1 pg/mL (ref 0.0–100.0)

## 2018-11-09 ENCOUNTER — Encounter: Payer: Self-pay | Admitting: Family Medicine

## 2018-11-10 ENCOUNTER — Telehealth: Payer: Self-pay | Admitting: *Deleted

## 2018-11-10 NOTE — Telephone Encounter (Signed)
A message was left, re: office visit.

## 2018-11-18 DIAGNOSIS — G4733 Obstructive sleep apnea (adult) (pediatric): Secondary | ICD-10-CM | POA: Diagnosis not present

## 2018-11-23 NOTE — Progress Notes (Signed)
Virtual Visit via Telephone Note   This visit type was conducted due to national recommendations for restrictions regarding the COVID-19 Pandemic (e.g. social distancing) in an effort to limit this patient's exposure and mitigate transmission in our community.  Due to his co-morbid illnesses, this patient is at least at moderate risk for complications without adequate follow up.  This format is felt to be most appropriate for this patient at this time.  The patient did not have access to video technology/had technical difficulties with video requiring transitioning to audio format only (telephone).  All issues noted in this document were discussed and addressed.  No physical exam could be performed with this format.  Please refer to the patient's chart for his  consent to telehealth for [facility]. Patient has given verbal permission to conduct this visit via virtual appointment and to bill insurance 11/24/2018 1:15 pm.     Date:  11/23/2018   ID:  [name], DOB [birth year], MRN [id number]  Patient Location: Home Provider Location: Home  PCP:  Chris Drown, MD  Cardiologist:  Chris Burow, MD  Electrophysiologist:  None   Evaluation Performed:  Follow-Up Visit  Chief Complaint:  Follow Up   History of Present Illness:    [name] is a 78 y.o. male we are following for ongoing assessment and management of coronary artery disease.  Patient had intervention to his LAD in 1996, follow-up Myoview in 2011 was low risk.  He was seen emergently on 03/15/2018 in the ED due to shortness of breath.  He was diagnosed with COPD exacerbation.  Also with diastolic CHF and treated with IV diuretics.  Dry weight 289 pounds.  Last encounter was virtually by [name], [place] on 05/09/2018.  At that time the patient was stable from a cardiac standpoint, his weight was down to 284 pounds and had been stable.  He continues to have issues with his COPD.  [name] is evaluated today via  telephone visit.  His daughter is in attendance.  He is doing well without any complaints of shortness of breath which has worsened despite his COPD.  Blood pressure has been well controlled, no lower extremity edema, no chest pain.  He is medically compliant, his daughter make sure he has his medications up-to-date and takes them.  He offers no requests or new complaints today.  The patient does not have symptoms concerning for COVID-19 infection (fever, chills, cough, or new shortness of breath).    Past Medical History:  Diagnosis Date  . Ankylosing spondylitis ([place]) 11/07/2018  . Asthma   . BPH (benign prostatic hyperplasia)   . CAD (coronary artery disease) 01/1995  . Clostridium difficile colitis 04/2005  . Colitis 2011  . COPD (chronic obstructive pulmonary disease) ([place])   . Depression   . Diverticulosis   . DJD (degenerative joint disease)   . Gastric ulcer 04/17/10   Three 51m gastric ulcers, H.pylori serologies were negative  . GERD (gastroesophageal reflux disease)   . History of kidney stones   . Hyperlipidemia   . Hypertension   . Idiopathic chronic inflammatory bowel disease 05/18/2010   left-sided UC  . Kidney stone   . Morbid obesity ([place] 03/12/2018  . Obstructive sleep apnea   . Reflux 02/1995  . S/P endoscopy 07/24/10   retained gastric contents, benign bx   Past Surgical History:  Procedure Laterality Date  . ANORECTAL MANOMETRY  2016   [facility]: concern for possible fissure. Noted pelvic floor dyssnyergy  .  BIOPSY  07/29/2017   Procedure: BIOPSY;  Surgeon: Chris Dolin, MD;  Location: [facility] ENDO SUITE;  Service: Endoscopy;;  ascending and sigmoid colon  . CARDIAC CATHETERIZATION     with stent  . CARDIOVASCULAR STRESS TEST  07/21/2009   No scintigraphic evidence of inducible myocardial ischemia  . CARPAL TUNNEL RELEASE Left 01/17/2017   Procedure: LEFT CARPAL TUNNEL RELEASE;  Surgeon: Chris Civil, MD;  Location: [facility] ORS;  Service: Orthopedics;   Laterality: Left;  . CATARACT EXTRACTION, BILATERAL Bilateral   . CERVICAL SPINE SURGERY     C4-5  . COLONOSCOPY  04/2005   granularity and friability erosions from rectum to 40cm. Bx infection vs IBD. C. Diff positive at the time.   . COLONOSCOPY  05/2010   [facility]: left-sided UC, bx with no dysplasia, shallow diverticula  . COLONOSCOPY [name] 11/07/2012   DVV:OHYW-VPXTG proctocolitis status post segmental biopsy/Sigmoid colon polyps removed as described above. Procedure compromisd by technical difficulties. bx: Inflammation limited to sigmoid and rectum on pathology.  . COLONOSCOPY  04/2014   [facility]oka [facility] at [facility]: well localized proctocolitis limited to sigmoid  . COLONOSCOPY WITH PROPOFOL [name] 07/29/2017   diverticulosis in colon, three 4-6 mm polyps at splenic flexure and in cecum, one 10 mm polyp in rectum, abnormal rectum and sigmoid consistent with active UC  . CORONARY STENT PLACEMENT  01/1995  . ESOPHAGOGASTRODUODENOSCOPY  07/24/2010   GYI:RSWNIO esophagus  . ESOPHAGOGASTRODUODENOSCOPY  04/2014   Dr[facility] Under[facility] at [facility]: negative small bowel biopsies  . FOOT SURGERY Bilateral two  . KNEE SURGERY  two  . POLYPECTOMY  07/29/2017   Procedure: POLYPECTOMY;  Surgeon: Chris Dolin, MD;  Location: [facility];  Service: Endoscopy;;  splenic flexure, ascending colon polyp;rectal  . SHOULDER SURGERY  two  . TONSILLECTOMY    . TRANSTHORACIC ECHOCARDIOGRAM  03/23/2009   EF 60-65%, normal LV systolic function  . ULNAR HEAD EXCISION Left 01/17/2017   Procedure: LEFT ULNAR HEAD RESECTION;  Surgeon: Chris Civil, MD;  Location: [facility] ORS;  Service: Orthopedics;  Laterality: Left;     No outpatient medications have been marked as taking for the 11/24/18 encounter (Appointment) with Chris Colonel, NP.     Allergies:   Patient has no known allergies.   Social History   Tobacco Use  . Smoking status: Former Smoker    Packs/day: 1.00    Years: 20.00    Pack years: 20.00    Types:  Cigarettes    Quit date: 09/30/1969    Years since quitting: 49.1  . Smokeless tobacco: Never Used  Substance Use Topics  . Alcohol use: No  . Drug use: No     Family Hx: The patient's family history includes Cancer in his mother; Diabetes in his mother; Hypertension in his father; Kidney failure in his brother; Lung cancer in his mother; Other in his child; Stroke in his father. There is no history of Colon cancer.  ROS:   Please see the history of present illness.    All other systems reviewed and are negative.   Prior CV studies:   The following studies were reviewed today: Echocardiogram 03/15/2018 1. The left ventricle has normal systolic function of 27-03%. The cavity size was normal. There is mildly increased left ventricular wall thickness. Echo evidence of impaired diastolic relaxation.  2. No evidence of left ventricular regional wall motion abnormalities.  3. The right ventricle has normal systolic function. The cavity was normal. There is no increase in  right ventricular wall thickness. Right ventricular systolic pressure could not be assessed.  4. Left atrial size was mildly dilated.  5. The mitral valve is normal in structure. There is mild calcification.  6. The tricuspid valve is normal in structure.  7. The aortic valve is normal in structure. There is sclerosis without any evidence of stenosis of the aortic valve.  8. There is dilatation of the aortic root 42 mm.  Labs/Other Tests and Data Reviewed:    EKG:  No ECG reviewed.  Recent Labs: 11/07/2018: ALT 12; BNP 93.1; BUN 15; Creatinine, Ser 1.00; Hemoglobin 14.2; Platelets 213; Potassium 4.0; Sodium 146   Recent Lipid Panel Lab Results  Component Value Date/Time   CHOL 137 11/07/2018 11:40 AM   TRIG 126 11/07/2018 11:40 AM   HDL 49 11/07/2018 11:40 AM   CHOLHDL 2.8 11/07/2018 11:40 AM   CHOLHDL 3.2 06/08/2015 09:07 AM   LDLCALC 66 11/07/2018 11:40 AM    Wt Readings from Last 3 Encounters:  11/06/18 298  lb 3.2 oz (135.3 kg)  08/26/18 294 lb 9.6 oz (133.6 kg)  05/09/18 283 lb (128.4 kg)     Objective:    Vital Signs:  There were no vitals taken for this visit.   VITAL SIGNS:  reviewed GEN:  no acute distress RESPIRATORY:  normal respiratory effort, symmetric expansion NEURO:  alert and oriented x 3, no obvious focal deficit PSYCH:  normal affect  ASSESSMENT & PLAN:    1.  Coronary artery disease: Intervention to LAD in 1996, low risk Myoview in 2011.  The patient offers no complaints of recurrent chest pain, fatigue, or palpitations.  We will continue his current medication regimen and plan to retest if he is symptomatic.  2.  Chronic diastolic heart failure: He denies any lower extremity edema, significant weight gain, or poor response from diuretic.  I have provided him 90-day refill on furosemide.  He has had recent labs by primary care physician 2 weeks ago.  These have been reviewed.  Creatinine 1.0, potassium 4.0.  He was not found to be anemic.  3.  Hypercholesterolemia: Goal of LDL less than 70.  Review of recent labs on 11/07/2018, LDL 66 on pravastatin.  He is provided refills today.  4.  Hypertension: Blood pressures well controlled currently.  I have reviewed labs as stated above.  He is given refills on losartan.  5.  Ulcerative colitis: He is going to be seeing a new GI physician with  GI for second opinion as his symptoms have not improved.  COVID-19 Education: The signs and symptoms of COVID-19 were discussed with the patient and how to seek care for testing (follow up with PCP or arrange E-visit).  The importance of social distancing was discussed today.  Time:   Today, I have spent 15 minutes with the patient with telehealth technology discussing the above problems.     Medication Adjustments/Labs and Tests Ordered: Current medicines are reviewed at length with the patient today.  Concerns regarding medicines are outlined above.   Tests Ordered: No orders  of the defined types were placed in this encounter.   Medication Changes: No orders of the defined types were placed in this encounter.   Disposition:  Follow up 6 months  Signed, Phill Myron. [place] Pugh, ANP, AACC  11/23/2018 11:02 AM     [facility]

## 2018-11-24 ENCOUNTER — Encounter: Payer: Self-pay | Admitting: Adult Health

## 2018-11-24 ENCOUNTER — Telehealth (INDEPENDENT_AMBULATORY_CARE_PROVIDER_SITE_OTHER): Payer: Medicare Other | Admitting: Adult Health

## 2018-11-24 VITALS — BP 139/56 | HR 95 | Ht 74.0 in | Wt 293.7 lb

## 2018-11-24 DIAGNOSIS — I5032 Chronic diastolic (congestive) heart failure: Secondary | ICD-10-CM

## 2018-11-24 DIAGNOSIS — I2510 Atherosclerotic heart disease of native coronary artery without angina pectoris: Secondary | ICD-10-CM

## 2018-11-24 DIAGNOSIS — E785 Hyperlipidemia, unspecified: Secondary | ICD-10-CM

## 2018-11-24 DIAGNOSIS — I10 Essential (primary) hypertension: Secondary | ICD-10-CM

## 2018-11-24 MED ORDER — LOSARTAN POTASSIUM 25 MG PO TABS: ORAL_TABLET | ORAL | 3 refills | Status: DC

## 2018-11-24 MED ORDER — PRAVASTATIN SODIUM 40 MG PO TABS: 40.0000 mg | ORAL_TABLET | Freq: Every day | ORAL | 3 refills | Status: DC

## 2018-11-24 MED ORDER — FUROSEMIDE 40 MG PO TABS: 20.0000 mg | ORAL_TABLET | Freq: Every morning | ORAL | 3 refills | Status: DC

## 2018-11-24 NOTE — Patient Instructions (Signed)
Medication Instructions:  Continue current medications  If you need a refill on your cardiac medications before your next appointment, please call your pharmacy.  Labwork: None Ordered   Testing/Procedures: None Ordered  Follow-Up: IN 6 months In Person Quay Burow, MD .  Please call our office in advance, 2 months to schedule this 6 months appointment.   At [facility], you and your health needs are our priority.  As part of our continuing mission to provide you with exceptional heart care, we have created designated Provider Care Teams.  These Care Teams include your primary Cardiologist (physician) and Advanced Practice Providers (APPs -  Physician Assistants and Nurse Practitioners) who all work together to provide you with the care you need, when you need it.  Thank you for choosing [facility] at [facility]!!

## 2018-12-05 DIAGNOSIS — G4733 Obstructive sleep apnea (adult) (pediatric): Secondary | ICD-10-CM | POA: Diagnosis not present

## 2018-12-06 DIAGNOSIS — G4733 Obstructive sleep apnea (adult) (pediatric): Secondary | ICD-10-CM | POA: Diagnosis not present

## 2018-12-11 ENCOUNTER — Telehealth: Payer: Self-pay | Admitting: Family Medicine

## 2018-12-11 DIAGNOSIS — K5190 Ulcerative colitis, unspecified, without complications: Secondary | ICD-10-CM

## 2018-12-11 NOTE — Telephone Encounter (Signed)
Left detailed message on voicemail.  

## 2018-12-11 NOTE — Addendum Note (Signed)
Addended by: Vicente Males on: 12/11/2018 11:37 AM   Modules accepted: Orders

## 2018-12-11 NOTE — Telephone Encounter (Signed)
Pt daughter would like a referral to Urology at [facility] for pt Ulcerative Colitis. Pt daughter states that [facility] will not see them. Please advise. Thank you.

## 2018-12-11 NOTE — Telephone Encounter (Signed)
I believe what is being requested is to see gastroenterology for his ulcerative colitis  It would be fine to refer to [facility] gastroenterology for ulcerative colitis

## 2018-12-12 NOTE — Telephone Encounter (Signed)
Pt daughter states that his dad told her that when the water in the shower hits his testicles it hurts. Did not express which one.  Pt is having pain even when not in shower. This is the 3rd he has mention it to daughter. Pt has appt on 12/18/2018 for video; can we make this in office. . Pt is "dribbling" on him self a lot more also.

## 2018-12-12 NOTE — Telephone Encounter (Signed)
It would be fine to do this as a office visit  Please make his visit a office visit  As per usual based on the current Covid safety regulations if having any acute respiratory symptoms/fever at the time of the office visit then we would have to reschedule

## 2018-12-12 NOTE — Telephone Encounter (Signed)
Left message for angie to return call

## 2018-12-15 NOTE — Telephone Encounter (Signed)
Discussed with pt's daughter that visit on 12th can be changed from virtual to office and front notified to change it as well so they can call day before and do covid screen.

## 2018-12-18 ENCOUNTER — Other Ambulatory Visit: Payer: Self-pay | Admitting: Family Medicine

## 2018-12-18 ENCOUNTER — Encounter: Payer: Self-pay | Admitting: Family Medicine

## 2018-12-18 ENCOUNTER — Ambulatory Visit: Payer: Medicare Other | Admitting: Family Medicine

## 2018-12-19 DIAGNOSIS — G4733 Obstructive sleep apnea (adult) (pediatric): Secondary | ICD-10-CM | POA: Diagnosis not present

## 2018-12-21 ENCOUNTER — Other Ambulatory Visit: Payer: Self-pay

## 2018-12-21 ENCOUNTER — Inpatient Hospital Stay (HOSPITAL_COMMUNITY)
Admission: EM | Admit: 2018-12-21 | Discharge: 2018-12-23 | DRG: 190 | Disposition: A | Discharge status: Home Health | Payer: Medicare Other | Attending: Internal Medicine | Admitting: Internal Medicine

## 2018-12-21 ENCOUNTER — Encounter (HOSPITAL_COMMUNITY): Payer: Self-pay | Admitting: Emergency Medicine

## 2018-12-21 ENCOUNTER — Emergency Department (HOSPITAL_COMMUNITY): Payer: Medicare Other

## 2018-12-21 DIAGNOSIS — J9622 Acute and chronic respiratory failure with hypercapnia: Secondary | ICD-10-CM | POA: Diagnosis present

## 2018-12-21 DIAGNOSIS — Z955 Presence of coronary angioplasty implant and graft: Secondary | ICD-10-CM

## 2018-12-21 DIAGNOSIS — I25119 Atherosclerotic heart disease of native coronary artery with unspecified angina pectoris: Secondary | ICD-10-CM | POA: Diagnosis not present

## 2018-12-21 DIAGNOSIS — Z79899 Other long term (current) drug therapy: Secondary | ICD-10-CM

## 2018-12-21 DIAGNOSIS — I10 Essential (primary) hypertension: Secondary | ICD-10-CM | POA: Diagnosis present

## 2018-12-21 DIAGNOSIS — Z6835 Body mass index (BMI) 35.0-35.9, adult: Secondary | ICD-10-CM

## 2018-12-21 DIAGNOSIS — Z7982 Long term (current) use of aspirin: Secondary | ICD-10-CM | POA: Diagnosis not present

## 2018-12-21 DIAGNOSIS — Z87891 Personal history of nicotine dependence: Secondary | ICD-10-CM

## 2018-12-21 DIAGNOSIS — Z7951 Long term (current) use of inhaled steroids: Secondary | ICD-10-CM | POA: Diagnosis not present

## 2018-12-21 DIAGNOSIS — T380X5A Adverse effect of glucocorticoids and synthetic analogues, initial encounter: Secondary | ICD-10-CM | POA: Diagnosis present

## 2018-12-21 DIAGNOSIS — J441 Chronic obstructive pulmonary disease with (acute) exacerbation: Secondary | ICD-10-CM | POA: Diagnosis not present

## 2018-12-21 DIAGNOSIS — J9600 Acute respiratory failure, unspecified whether with hypoxia or hypercapnia: Secondary | ICD-10-CM

## 2018-12-21 DIAGNOSIS — I5032 Chronic diastolic (congestive) heart failure: Secondary | ICD-10-CM | POA: Diagnosis not present

## 2018-12-21 DIAGNOSIS — J9601 Acute respiratory failure with hypoxia: Secondary | ICD-10-CM | POA: Diagnosis not present

## 2018-12-21 DIAGNOSIS — R0600 Dyspnea, unspecified: Secondary | ICD-10-CM

## 2018-12-21 DIAGNOSIS — J9621 Acute and chronic respiratory failure with hypoxia: Secondary | ICD-10-CM | POA: Diagnosis not present

## 2018-12-21 DIAGNOSIS — J439 Emphysema, unspecified: Secondary | ICD-10-CM | POA: Diagnosis not present

## 2018-12-21 DIAGNOSIS — Z87442 Personal history of urinary calculi: Secondary | ICD-10-CM | POA: Diagnosis not present

## 2018-12-21 DIAGNOSIS — Z9861 Coronary angioplasty status: Secondary | ICD-10-CM | POA: Diagnosis not present

## 2018-12-21 DIAGNOSIS — I2510 Atherosclerotic heart disease of native coronary artery without angina pectoris: Secondary | ICD-10-CM

## 2018-12-21 DIAGNOSIS — J449 Chronic obstructive pulmonary disease, unspecified: Secondary | ICD-10-CM

## 2018-12-21 DIAGNOSIS — R0602 Shortness of breath: Secondary | ICD-10-CM | POA: Diagnosis not present

## 2018-12-21 DIAGNOSIS — Z801 Family history of malignant neoplasm of trachea, bronchus and lung: Secondary | ICD-10-CM

## 2018-12-21 DIAGNOSIS — I110 Hypertensive heart disease with heart failure: Secondary | ICD-10-CM | POA: Diagnosis present

## 2018-12-21 DIAGNOSIS — N400 Enlarged prostate without lower urinary tract symptoms: Secondary | ICD-10-CM | POA: Diagnosis present

## 2018-12-21 DIAGNOSIS — K219 Gastro-esophageal reflux disease without esophagitis: Secondary | ICD-10-CM | POA: Diagnosis present

## 2018-12-21 DIAGNOSIS — R7303 Prediabetes: Secondary | ICD-10-CM | POA: Diagnosis not present

## 2018-12-21 DIAGNOSIS — E785 Hyperlipidemia, unspecified: Secondary | ICD-10-CM | POA: Diagnosis present

## 2018-12-21 DIAGNOSIS — Z20828 Contact with and (suspected) exposure to other viral communicable diseases: Secondary | ICD-10-CM | POA: Diagnosis present

## 2018-12-21 DIAGNOSIS — Z8711 Personal history of peptic ulcer disease: Secondary | ICD-10-CM

## 2018-12-21 DIAGNOSIS — F419 Anxiety disorder, unspecified: Secondary | ICD-10-CM | POA: Diagnosis present

## 2018-12-21 DIAGNOSIS — G4733 Obstructive sleep apnea (adult) (pediatric): Secondary | ICD-10-CM | POA: Diagnosis present

## 2018-12-21 DIAGNOSIS — Z833 Family history of diabetes mellitus: Secondary | ICD-10-CM

## 2018-12-21 DIAGNOSIS — K5190 Ulcerative colitis, unspecified, without complications: Secondary | ICD-10-CM | POA: Diagnosis not present

## 2018-12-21 DIAGNOSIS — Z841 Family history of disorders of kidney and ureter: Secondary | ICD-10-CM

## 2018-12-21 DIAGNOSIS — J9690 Respiratory failure, unspecified, unspecified whether with hypoxia or hypercapnia: Secondary | ICD-10-CM | POA: Diagnosis present

## 2018-12-21 DIAGNOSIS — Z823 Family history of stroke: Secondary | ICD-10-CM

## 2018-12-21 DIAGNOSIS — Z8249 Family history of ischemic heart disease and other diseases of the circulatory system: Secondary | ICD-10-CM

## 2018-12-21 LAB — CBC WITH DIFFERENTIAL/PLATELET
Abs Immature Granulocytes: 0.04 10*3/uL (ref 0.00–0.07)
Basophils Absolute: 0.1 10*3/uL (ref 0.0–0.1)
Basophils Relative: 1 %
Eosinophils Absolute: 0.1 10*3/uL (ref 0.0–0.5)
Eosinophils Relative: 1 %
HCT: 43.1 % (ref 39.0–52.0)
Hemoglobin: 13.4 g/dL (ref 13.0–17.0)
Immature Granulocytes: 0 %
Lymphocytes Relative: 24 %
Lymphs Abs: 3.1 10*3/uL (ref 0.7–4.0)
MCH: 29.3 pg (ref 26.0–34.0)
MCHC: 31.1 g/dL (ref 30.0–36.0)
MCV: 94.3 fL (ref 80.0–100.0)
Monocytes Absolute: 1.2 10*3/uL — ABNORMAL HIGH (ref 0.1–1.0)
Monocytes Relative: 9 %
Neutro Abs: 8.5 10*3/uL — ABNORMAL HIGH (ref 1.7–7.7)
Neutrophils Relative %: 65 %
Platelets: 224 10*3/uL (ref 150–400)
RBC: 4.57 MIL/uL (ref 4.22–5.81)
RDW: 13.3 % (ref 11.5–15.5)
WBC: 13 10*3/uL — ABNORMAL HIGH (ref 4.0–10.5)
nRBC: 0 % (ref 0.0–0.2)

## 2018-12-21 LAB — COMPREHENSIVE METABOLIC PANEL
ALT: 14 U/L (ref 0–44)
AST: 17 U/L (ref 15–41)
Albumin: 3.6 g/dL (ref 3.5–5.0)
Alkaline Phosphatase: 64 U/L (ref 38–126)
Anion gap: 6 (ref 5–15)
BUN: 22 mg/dL (ref 8–23)
CO2: 26 mmol/L (ref 22–32)
Calcium: 8.8 mg/dL — ABNORMAL LOW (ref 8.9–10.3)
Chloride: 107 mmol/L (ref 98–111)
Creatinine, Ser: 1.27 mg/dL — ABNORMAL HIGH (ref 0.61–1.24)
GFR calc Af Amer: >60 mL/min (ref >60)
GFR calc non Af Amer: 53 mL/min — ABNORMAL LOW (ref >60)
Glucose, Bld: 95 mg/dL (ref 70–99)
Potassium: 4.2 mmol/L (ref 3.5–5.1)
Sodium: 139 mmol/L (ref 135–145)
Total Bilirubin: 0.9 mg/dL (ref 0.3–1.2)
Total Protein: 7.1 g/dL (ref 6.5–8.1)

## 2018-12-21 LAB — TROPONIN I (HIGH SENSITIVITY)
Troponin I (High Sensitivity): 9 ng/L (ref <18)
Troponin I (High Sensitivity): 9 ng/L (ref <18)

## 2018-12-21 LAB — PROTIME-INR
INR: 1.1 (ref 0.8–1.2)
Prothrombin Time: 14.3 seconds (ref 11.4–15.2)

## 2018-12-21 LAB — BRAIN NATRIURETIC PEPTIDE: B Natriuretic Peptide: 99.5 pg/mL (ref 0.0–100.0)

## 2018-12-21 MED ORDER — BALSALAZIDE DISODIUM 750 MG PO CAPS
2250.0000 mg | ORAL_CAPSULE | Freq: Three times a day (TID) | ORAL | Status: DC
  Administered 2018-12-21 – 2018-12-22 (×4): 2250 mg via ORAL
  Filled 2018-12-21 (×11): qty 3

## 2018-12-21 MED ORDER — SODIUM CHLORIDE 0.9 % IV SOLN: 250.0000 mL | INTRAVENOUS | Status: DC | PRN

## 2018-12-21 MED ORDER — SERTRALINE HCL 50 MG PO TABS
50.0000 mg | ORAL_TABLET | Freq: Every day | ORAL | Status: DC
  Administered 2018-12-22 – 2018-12-23 (×2): 50 mg via ORAL
  Filled 2018-12-21 (×2): qty 1

## 2018-12-21 MED ORDER — FUROSEMIDE 40 MG PO TABS
40.0000 mg | ORAL_TABLET | Freq: Every morning | ORAL | Status: DC
  Administered 2018-12-22 – 2018-12-23 (×2): 40 mg via ORAL
  Filled 2018-12-21 (×2): qty 1

## 2018-12-21 MED ORDER — ACETAMINOPHEN 650 MG RE SUPP: 650.0000 mg | Freq: Four times a day (QID) | RECTAL | Status: DC | PRN

## 2018-12-21 MED ORDER — SODIUM CHLORIDE 0.9% FLUSH
3.0000 mL | INTRAVENOUS | Status: DC | PRN
  Administered 2018-12-23: 3 mL via INTRAVENOUS
  Filled 2018-12-21: qty 3

## 2018-12-21 MED ORDER — SODIUM CHLORIDE 0.9% FLUSH
3.0000 mL | Freq: Two times a day (BID) | INTRAVENOUS | Status: DC
  Administered 2018-12-21 – 2018-12-22 (×3): 3 mL via INTRAVENOUS

## 2018-12-21 MED ORDER — ALBUTEROL SULFATE HFA 108 (90 BASE) MCG/ACT IN AERS
2.0000 | INHALATION_SPRAY | Freq: Once | RESPIRATORY_TRACT | Status: AC
  Administered 2018-12-21: 2 via RESPIRATORY_TRACT
  Filled 2018-12-21: qty 6.7

## 2018-12-21 MED ORDER — ALBUTEROL SULFATE HFA 108 (90 BASE) MCG/ACT IN AERS: 2.0000 | INHALATION_SPRAY | Freq: Four times a day (QID) | RESPIRATORY_TRACT | Status: DC | PRN

## 2018-12-21 MED ORDER — ALBUTEROL SULFATE HFA 108 (90 BASE) MCG/ACT IN AERS
2.0000 | INHALATION_SPRAY | Freq: Four times a day (QID) | RESPIRATORY_TRACT | Status: DC
  Administered 2018-12-22 (×2): 2 via RESPIRATORY_TRACT

## 2018-12-21 MED ORDER — AMLODIPINE BESYLATE 5 MG PO TABS
2.5000 mg | ORAL_TABLET | Freq: Every morning | ORAL | Status: DC
  Administered 2018-12-22 – 2018-12-23 (×2): 2.5 mg via ORAL
  Filled 2018-12-21 (×2): qty 1

## 2018-12-21 MED ORDER — UMECLIDINIUM-VILANTEROL 62.5-25 MCG/INH IN AEPB
1.0000 | INHALATION_SPRAY | Freq: Every day | RESPIRATORY_TRACT | Status: DC
  Administered 2018-12-22 – 2018-12-23 (×2): 1 via RESPIRATORY_TRACT
  Filled 2018-12-21: qty 14

## 2018-12-21 MED ORDER — METOPROLOL TARTRATE 50 MG PO TABS
50.0000 mg | ORAL_TABLET | Freq: Every day | ORAL | Status: DC
  Administered 2018-12-21 – 2018-12-22 (×2): 50 mg via ORAL
  Filled 2018-12-21 (×2): qty 1

## 2018-12-21 MED ORDER — ASPIRIN EC 81 MG PO TBEC
81.0000 mg | DELAYED_RELEASE_TABLET | Freq: Every day | ORAL | Status: DC
  Administered 2018-12-21 – 2018-12-22 (×2): 81 mg via ORAL
  Filled 2018-12-21 (×2): qty 1

## 2018-12-21 MED ORDER — ENOXAPARIN SODIUM 40 MG/0.4ML ~~LOC~~ SOLN
40.0000 mg | SUBCUTANEOUS | Status: DC
  Administered 2018-12-21 – 2018-12-22 (×2): 40 mg via SUBCUTANEOUS
  Filled 2018-12-21 (×2): qty 0.4

## 2018-12-21 MED ORDER — ALBUTEROL SULFATE (2.5 MG/3ML) 0.083% IN NEBU: 5.0000 mg | INHALATION_SOLUTION | Freq: Once | RESPIRATORY_TRACT | Status: DC

## 2018-12-21 MED ORDER — HYDRALAZINE HCL 20 MG/ML IJ SOLN: 10.0000 mg | Freq: Four times a day (QID) | INTRAMUSCULAR | Status: DC | PRN

## 2018-12-21 MED ORDER — METHYLPREDNISOLONE SODIUM SUCC 125 MG IJ SOLR
80.0000 mg | Freq: Three times a day (TID) | INTRAMUSCULAR | Status: DC
  Administered 2018-12-22 – 2018-12-23 (×4): 80 mg via INTRAVENOUS
  Filled 2018-12-21 (×4): qty 2

## 2018-12-21 MED ORDER — ACETAMINOPHEN 325 MG PO TABS
650.0000 mg | ORAL_TABLET | Freq: Four times a day (QID) | ORAL | Status: DC | PRN
  Administered 2018-12-22 – 2018-12-23 (×2): 650 mg via ORAL
  Filled 2018-12-21 (×2): qty 2

## 2018-12-21 MED ORDER — GABAPENTIN 300 MG PO CAPS
300.0000 mg | ORAL_CAPSULE | Freq: Two times a day (BID) | ORAL | Status: DC
  Administered 2018-12-21 – 2018-12-23 (×4): 300 mg via ORAL
  Filled 2018-12-21 (×4): qty 1

## 2018-12-21 MED ORDER — SODIUM CHLORIDE 0.9 % IV SOLN
500.0000 mg | INTRAVENOUS | Status: DC
  Administered 2018-12-21: 500 mg via INTRAVENOUS
  Filled 2018-12-21 (×4): qty 500

## 2018-12-21 MED ORDER — METHYLPREDNISOLONE SODIUM SUCC 125 MG IJ SOLR
125.0000 mg | Freq: Once | INTRAMUSCULAR | Status: AC
  Administered 2018-12-21: 125 mg via INTRAVENOUS
  Filled 2018-12-21: qty 2

## 2018-12-21 MED ORDER — LOSARTAN POTASSIUM 25 MG PO TABS
25.0000 mg | ORAL_TABLET | Freq: Every day | ORAL | Status: DC
  Administered 2018-12-22 – 2018-12-23 (×2): 25 mg via ORAL
  Filled 2018-12-21 (×2): qty 1

## 2018-12-21 MED ORDER — DOXAZOSIN MESYLATE 2 MG PO TABS
4.0000 mg | ORAL_TABLET | Freq: Every day | ORAL | Status: DC
  Administered 2018-12-21 – 2018-12-22 (×2): 4 mg via ORAL
  Filled 2018-12-21 (×2): qty 2

## 2018-12-21 MED ORDER — PRAVASTATIN SODIUM 40 MG PO TABS
40.0000 mg | ORAL_TABLET | Freq: Every day | ORAL | Status: DC
  Administered 2018-12-21 – 2018-12-22 (×2): 40 mg via ORAL
  Filled 2018-12-21 (×2): qty 1

## 2018-12-21 MED ORDER — LOPERAMIDE HCL 2 MG PO CAPS
2.0000 mg | ORAL_CAPSULE | Freq: Every day | ORAL | Status: DC
  Administered 2018-12-22 – 2018-12-23 (×2): 2 mg via ORAL
  Filled 2018-12-21 (×2): qty 1

## 2018-12-21 NOTE — Progress Notes (Signed)
RT is waiting on results for COVID test to come back before placing patient on CPAP. RT COVID protocol.

## 2018-12-21 NOTE — ED Triage Notes (Signed)
Patient brought in by daughter with complaints of SOB and back pain x1 week. States "the last time I was like this I had fluid on my lungs".

## 2018-12-21 NOTE — ED Provider Notes (Signed)
[facility] DEPT Provider Note   CSN: [id number] Arrival date & time: 12/21/18  1738     History   Chief Complaint Chief Complaint  Patient presents with  . Shortness of Breath  . Back Pain    HPI [name] is a 78 y.o. male.     78 year old male with prior medical history as detailed below presents for evaluation of shortness of breath.  Patient is accompanied by his daughter who provides majority of history.  Patient with gradually progressive dyspnea over the last week.  No reported fevers.  Patient does have a chronic cough.  Patient with prior history significant for COPD and CHF.  The daughter is concerned that his symptoms today may be secondary to CHF exacerbation.  The patient does not weigh himself on a daily basis.  He denies associated chest pain, nausea, vomiting, diaphoresis, or other specific complaint.  He denies known exposure to Covid positive patient.  Symptoms are worse with exertion/ambulation.  The history is provided by the patient and medical records.  Shortness of Breath Severity:  Moderate Onset quality:  Gradual Duration:  1 week Timing:  Constant Progression:  Worsening Chronicity:  New Context: activity   Relieved by:  Nothing Worsened by:  Nothing Ineffective treatments:  None tried Associated symptoms: no chest pain and no fever   Back Pain Associated symptoms: no chest pain and no fever     Past Medical History:  Diagnosis Date  . Ankylosing spondylitis ([place]) 11/07/2018  . Asthma   . BPH (benign prostatic hyperplasia)   . CAD (coronary artery disease) 01/1995  . Clostridium difficile colitis 04/2005  . Colitis 2011  . COPD (chronic obstructive pulmonary disease) ([place])   . Depression   . Diverticulosis   . DJD (degenerative joint disease)   . Gastric ulcer 04/17/10   Three 105m gastric ulcers, H.pylori serologies were negative  . GERD (gastroesophageal reflux disease)   . History of kidney  stones   . Hyperlipidemia   . Hypertension   . Idiopathic chronic inflammatory bowel disease 05/18/2010   left-sided UC  . Kidney stone   . Morbid obesity ([place] 03/12/2018  . Obstructive sleep apnea   . Reflux 02/1995  . S/P endoscopy 07/24/10   retained gastric contents, benign bx    Patient Active Problem List   Diagnosis Date Noted  . Ankylosing spondylitis ([place] 11/07/2018  . Rectal bleeding 08/26/2018  . Chronic diastolic CHF (congestive heart failure) ([place] 03/27/2018  . Exertional dyspnea 03/15/2018  . Dyspnea 03/15/2018  . Morbid obesity ([place] 03/12/2018  . History of carpal tunnel surgery of left wrist with unlar head resection 01/17/17 01/24/2017  . Closed fracture of distal radius and ulna, left, with malunion, subsequent encounter   . Carpal tunnel syndrome of left wrist   . Fracture, [place], left, closed 11/29/2016  . Hx of skin cancer, basal cell 11/30/2015  . Erectile dysfunction 06/06/2015  . Iron deficiency 08/18/2014  . Fecal incontinence 02/10/2014  . Essential hypertension 01/09/2013  . Dyslipidemia, goal LDL below 70 01/09/2013  . Chest pain at rest 12/14/2012  . CAD S/P percutaneous coronary angioplasty 12/14/2012  . Hiatal hernia 12/14/2012  . GERD (gastroesophageal reflux disease) 12/14/2012  . Prediabetes 12/09/2012  . BPH (benign prostatic hyperplasia) 11/18/2012  . RUQ pain 09/04/2012  . Gallstones 05/09/2012  . COPD (chronic obstructive pulmonary disease) ([place] 04/30/2012  . Obstructive sleep apnea 04/30/2012  . Ulcerative colitis ([place] 04/29/2012  . Ulcerative colitis, left sided (  [place]) 12/26/2011  . Diarrhea 10/18/2010    Past Surgical History:  Procedure Laterality Date  . ANORECTAL MANOMETRY  2016   [facility]: concern for possible fissure. Noted pelvic floor dyssnyergy  . BIOPSY  07/29/2017   Procedure: BIOPSY;  Surgeon: Daneil Dolin, MD;  Location: [facility] SUITE;  Service: Endoscopy;;  ascending and sigmoid colon  . CARDIAC  CATHETERIZATION     with stent  . CARDIOVASCULAR STRESS TEST  07/21/2009   No scintigraphic evidence of inducible myocardial ischemia  . CARPAL TUNNEL RELEASE Left 01/17/2017   Procedure: LEFT CARPAL TUNNEL RELEASE;  Surgeon: Carole Civil, MD;  Location: [facility] ORS;  Service: Orthopedics;  Laterality: Left;  . CATARACT EXTRACTION, BILATERAL Bilateral   . CERVICAL SPINE SURGERY     C4-5  . COLONOSCOPY  04/2005   granularity and friability erosions from rectum to 40cm. Bx infection vs IBD. C. Diff positive at the time.   . COLONOSCOPY  05/2010   [facility]: left-sided UC, bx with no dysplasia, shallow diverticula  . COLONOSCOPY [name] 11/07/2012   [id number] proctocolitis status post segmental biopsy/Sigmoid colon polyps removed as described above. Procedure compromisd by technical difficulties. bx: Inflammation limited to sigmoid and rectum on pathology.  . COLONOSCOPY  04/2014   [facility] at [facility]: well localized proctocolitis limited to sigmoid  . COLONOSCOPY WITH PROPOFOL [name] 07/29/2017   diverticulosis in colon, three 4-6 mm polyps at splenic flexure and in cecum, one 10 mm polyp in rectum, abnormal rectum and sigmoid consistent with active UC  . CORONARY STENT PLACEMENT  01/1995  . ESOPHAGOGASTRODUODENOSCOPY  07/24/2010   STM:[id number] esophagus  . ESOPHAGOGASTRODUODENOSCOPY  04/2014   Dr. [facility]den at [facility]: negative small bowel biopsies  . FOOT SURGERY Bilateral two  . KNEE SURGERY  two  . POLYPECTOMY  07/29/2017   Procedure: POLYPECTOMY;  Surgeon: Daneil Dolin, MD;  Location: [facility];  Service: Endoscopy;;  splenic flexure, ascending colon polyp;rectal  . SHOULDER SURGERY  two  . TONSILLECTOMY    . TRANSTHORACIC ECHOCARDIOGRAM  03/23/2009   EF 60-65%, normal LV systolic function  . ULNAR HEAD EXCISION Left 01/17/2017   Procedure: LEFT ULNAR HEAD RESECTION;  Surgeon: Carole Civil, MD;  Location: [facility] ORS;  Service: Orthopedics;  Laterality: Left;        Home  Medications    Prior to Admission medications   Medication Sig Start Date End Date Taking? Authorizing Provider  acetaminophen (TYLENOL) 325 MG tablet Take 650 mg by mouth every 6 (six) hours as needed for moderate pain or headache.    [provider]  amLODipine (NORVASC) 2.5 MG tablet Take 1 tablet (2.5 mg total) by mouth every morning. 11/06/18   [name], MD  aspirin 81 MG tablet Take 1 tablet (81 mg total) by mouth at bedtime. 03/18/18   Patrecia Pour, MD  balsalazide (COLAZAL) 750 MG capsule Take 3 capsules (2,250 mg total) by mouth 3 (three) times daily. 08/26/18 11/24/18  Carlis Stable, NP  doxazosin (CARDURA) 4 MG tablet Take one tablet by mouth at bedtime 11/06/18   [name], MD  furosemide (LASIX) 40 MG tablet Take 0.5 tablets (20 mg total) by mouth every morning. 11/24/18   Lendon Colonel, NP  loperamide (IMODIUM) 2 MG capsule Take 2 mg by mouth daily.    [provider]  losartan (COZAAR) 25 MG tablet TAKE 1 TABLET BY MOUTH ONCE DAILY IN THE MORNING 11/24/18   Lendon Colonel, NP  metoprolol tartrate (LOPRESSOR) 50 MG tablet One po QHS 11/06/18   [name], MD  mupirocin ointment (BACTROBAN) 2 % Apply thin layer to affected area once a day 11/06/18   [name], MD  nitroGLYCERIN (NITROSTAT) 0.4 MG SL tablet Place 0.4 mg under the tongue every 5 (five) minutes as needed for chest pain.    [provider]  pravastatin (PRAVACHOL) 40 MG tablet Take 1 tablet (40 mg total) by mouth at bedtime. 11/24/18   Lendon Colonel, NP  sertraline (ZOLOFT) 50 MG tablet TAKE 1 TABLET BY MOUTH ONCE DAILY -  NEEDS  VIRTUAL  VISIT 12/18/18   [name], MD  tiotropium (SPIRIVA HANDIHALER) 18 MCG inhalation capsule One inhalation po daily 11/06/18   Luking, Elayne Snare, MD  triamcinolone cream (KENALOG) 0.1 % Apply twice daily as needed for psoriasis. 04/22/18   [name], MD    Family History Family History  Problem Relation Age of Onset   . Lung cancer Mother   . Cancer Mother        breast  . Diabetes Mother   . Stroke Father   . Hypertension Father   . Kidney failure Brother   . Other Child        blood infection  . Colon cancer Neg Hx     Social History Social History   Tobacco Use  . Smoking status: Former Smoker    Packs/day: 1.00    Years: 20.00    Pack years: 20.00    Types: Cigarettes    Quit date: 09/30/1969    Years since quitting: 49.2  . Smokeless tobacco: Never Used  Substance Use Topics  . Alcohol use: No  . Drug use: No     Allergies   Patient has no known allergies.   Review of Systems Review of Systems  Constitutional: Negative for fever.  Respiratory: Positive for shortness of breath.   Cardiovascular: Negative for chest pain.  Musculoskeletal: Positive for back pain.  All other systems reviewed and are negative.    Physical Exam Updated Vital Signs BP (!) 110/52 (BP Location: Left Arm)   Pulse 73   Temp 98.2 F (36.8 C) (Oral)   Resp 20   SpO2 95%   Physical Exam Vitals signs and nursing note reviewed.  Constitutional:      General: He is not in acute distress.    Appearance: He is well-developed.  HENT:     Head: Normocephalic and atraumatic.  Eyes:     Conjunctiva/sclera: Conjunctivae normal.     Pupils: Pupils are equal, round, and reactive to light.  Neck:     Musculoskeletal: Normal range of motion and neck supple.  Cardiovascular:     Rate and Rhythm: Normal rate and regular rhythm.     Heart sounds: Normal heart sounds.  Pulmonary:     Effort: Pulmonary effort is normal. No respiratory distress.     Breath sounds: Examination of the right-middle field reveals wheezing. Examination of the left-middle field reveals wheezing. Examination of the right-lower field reveals decreased breath sounds. Examination of the left-lower field reveals decreased breath sounds. Decreased breath sounds and wheezing present.  Abdominal:     General: There is no distension.      Palpations: Abdomen is soft.     Tenderness: There is no abdominal tenderness.  Musculoskeletal: Normal range of motion.        General: No deformity.  Skin:    General: Skin is warm  and dry.  Neurological:     General: No focal deficit present.     Mental Status: He is alert and oriented to person, place, and time.      ED Treatments / Results  Labs (all labs ordered are listed, but only abnormal results are displayed) Labs Reviewed  COMPREHENSIVE METABOLIC PANEL - Abnormal; Notable for the following components:      Result Value   Creatinine, Ser 1.27 (*)    Calcium 8.8 (*)    GFR calc non Af Amer 53 (*)    All other components within normal limits  CBC WITH DIFFERENTIAL/PLATELET - Abnormal; Notable for the following components:   WBC 13.0 (*)    Neutro Abs 8.5 (*)    Monocytes Absolute 1.2 (*)    All other components within normal limits  SARS CORONAVIRUS 2 (TAT 6-24 HRS)  BRAIN NATRIURETIC PEPTIDE  PROTIME-INR  COMPREHENSIVE METABOLIC PANEL  CBC  TROPONIN I (HIGH SENSITIVITY)  TROPONIN I (HIGH SENSITIVITY)    EKG EKG Interpretation  Date/Time:  Sunday December 21 2018 17:59:16 EST Ventricular Rate:  76 PR Interval:    QRS Duration: 93 QT Interval:  373 QTC Calculation: 420 R Axis:   -26 Text Interpretation: Sinus rhythm Prolonged PR interval Borderline left axis deviation Abnormal T, consider ischemia, lateral leads No significant change since 03/14/2018 Confirmed by Dene Gentry [id number]) on 12/21/2018 6:31:59 PM   Radiology Dg Chest Port 1 View  Result Date: 12/21/2018 CLINICAL DATA:  Dyspnea EXAM: PORTABLE CHEST 1 VIEW COMPARISON:  03/15/2018 FINDINGS: Cardiomegaly. Emphysema. The visualized skeletal structures are unremarkable. IMPRESSION: 1. Emphysema without acute abnormality of the lungs in [facility] portable projection 2.  Cardiomegaly. Electronically Signed   By: Eddie Candle M.D.   On: 12/21/2018 19:38    Procedures Procedures (including critical  care time)  Medications Ordered in ED Medications - No data to display   Initial Impression / Assessment and Plan / ED Course  I have reviewed the triage vital signs and the nursing notes.  Pertinent labs & imaging results that were available during my care of the patient were reviewed by me and considered in my medical decision making (see chart for details).        MDM  Screen complete  DAION GINSBERG was evaluated in Emergency Department on 12/21/2018 for the symptoms described in the history of present illness. He was evaluated in the context of the global COVID-19 pandemic, which necessitated consideration that the patient might be at risk for infection with the SARS-CoV-2 virus that causes COVID-19. Institutional protocols and algorithms that pertain to the evaluation of patients at risk for COVID-19 are in a state of rapid change based on information released by regulatory bodies including the CDC and federal and state organizations. These policies and algorithms were followed during the patient's care in the ED.  Patient is presenting for evaluation of shortness of breath.   Initial labs do not reflect likely fluid overload.   Suspect that symptoms are most likely secondary to COPD exacerbation. No clear indication for antibiotics at this time.   Hospitalist service is aware of case and will evaluate for admission.   Final Clinical Impressions(s) / ED Diagnoses   Final diagnoses:  Dyspnea, unspecified type  COPD exacerbation [facility])    ED Discharge Orders    None       Valarie Merino, MD 12/21/18 2040

## 2018-12-21 NOTE — H&P (Signed)
TRH H&P    Patient Demographics:    [name], is a 78 y.o. male  MRN: [id number]  DOB - February 28, 1940  Admit Date - 12/21/2018  Referring MD/NP/PA: Elmyra Ricks  Outpatient Primary MD for the patient is Wolfgang Phoenix, Elayne Snare, MD Quay Burow    Patient coming from:  home  Chief complaint- dyspnea   HPI:    [name]  is a 78 y.o. male, w hypertension, hyperlipidemia, Glucose intolerance, CAD s/p stent, Diastolic CHF??? (per Lukings NP office note), Gerd, PUD, Ulcerative colitis, Copd presents with c/o dyspnea worse today. Cough w green sputum. Wheezing.  Pt denies fever, chills, cp, pap, n/v, diarrhea, brbpr.   In ED,  T 98.2, P 73  R 20, Bp 110/52  Pox 95% on 4L [place], 88% on [place]  CXR IMPRESSION: 1. Emphysema without acute abnormality of the lungs in AP portable projection 2.  Cardiomegaly.  Na 139, K 4.2, Bun 22, Creatinine 1.27 Ast 17, Alt 14 Wbc 13.0, Hgb 13.4, Plt 224 Trop  9  INR 1.1 BNP 99.5  Pt will be admitted for acute respiratory failure with hypoxia, secondary to Copd exacerbation.      Review of systems:    In addition to the HPI above,  No Fever-chills, No Headache, No changes with Vision or hearing, No problems swallowing food or Liquids, No Chest pain,   No Abdominal pain, No Nausea or Vomiting, bowel movements are regular, No Blood in stool or Urine, No dysuria, No new skin rashes or bruises, No new joints pains-aches,  No new weakness, tingling, numbness in any extremity, No recent weight gain or loss, No polyuria, polydypsia or polyphagia, No significant Mental Stressors.  All other systems reviewed and are negative.    Past History of the following :    Past Medical History:  Diagnosis Date  . Ankylosing spondylitis ([place]) 11/07/2018  . Asthma   . BPH (benign prostatic hyperplasia)   . CAD (coronary artery disease) 01/1995  . Clostridium difficile  colitis 04/2005  . Colitis 2011  . COPD (chronic obstructive pulmonary disease) ([place])   . Depression   . Diverticulosis   . DJD (degenerative joint disease)   . Gastric ulcer 04/17/10   Three 20m gastric ulcers, H.pylori serologies were negative  . GERD (gastroesophageal reflux disease)   . History of kidney stones   . Hyperlipidemia   . Hypertension   . Idiopathic chronic inflammatory bowel disease 05/18/2010   left-sided UC  . Kidney stone   . Morbid obesity ([place] 03/12/2018  . Obstructive sleep apnea   . Reflux 02/1995  . S/P endoscopy 07/24/10   retained gastric contents, benign bx      Past Surgical History:  Procedure Laterality Date  . ANORECTAL MANOMETRY  2016   [facility]: concern for possible fissure. Noted pelvic floor dyssnyergy  . BIOPSY  07/29/2017   Procedure: BIOPSY;  Surgeon: RDaneil Dolin MD;  Location: [facility] ENDO SUITE;  Service: Endoscopy;;  ascending and sigmoid colon  . CARDIAC CATHETERIZATION     with  stent  . CARDIOVASCULAR STRESS TEST  07/21/2009   No scintigraphic evidence of inducible myocardial ischemia  . CARPAL TUNNEL RELEASE Left 01/17/2017   Procedure: LEFT CARPAL TUNNEL RELEASE;  Surgeon: Carole Civil, MD;  Location: [facility] ORS;  Service: Orthopedics;  Laterality: Left;  . CATARACT EXTRACTION, BILATERAL Bilateral   . CERVICAL SPINE SURGERY     C4-5  . COLONOSCOPY  04/2005   granularity and friability erosions from rectum to 40cm. Bx infection vs IBD. C. Diff positive at the time.   . COLONOSCOPY  05/2010   [facility]: left-sided UC, bx with no dysplasia, shallow diverticula  . COLONOSCOPY [name] 11/07/2012   [id number] proctocolitis status post segmental biopsy/Sigmoid colon polyps removed as described above. Procedure compromisd by technical difficulties. bx: Inflammation limited to sigmoid and rectum on pathology.  . COLONOSCOPY  04/2014   Dr. Nyoka Cowden at [facility]: well localized proctocolitis limited to sigmoid  . COLONOSCOPY WITH PROPOFOL [name] 07/29/2017    diverticulosis in colon, three 4-6 mm polyps at splenic flexure and in cecum, one 10 mm polyp in rectum, abnormal rectum and sigmoid consistent with active UC  . CORONARY STENT PLACEMENT  01/1995  . ESOPHAGOGASTRODUODENOSCOPY  07/24/2010   TKP:[id number] esophagus  . ESOPHAGOGASTRODUODENOSCOPY  04/2014   Dr. Nyoka Cowden at [facility]: negative small bowel biopsies  . FOOT SURGERY Bilateral two  . KNEE SURGERY  two  . POLYPECTOMY  07/29/2017   Procedure: POLYPECTOMY;  Surgeon: Daneil Dolin, MD;  Location: [facility] ENDO SUITE;  Service: Endoscopy;;  splenic flexure, ascending colon polyp;rectal  . SHOULDER SURGERY  two  . TONSILLECTOMY    . TRANSTHORACIC ECHOCARDIOGRAM  03/23/2009   EF 60-65%, normal LV systolic function  . ULNAR HEAD EXCISION Left 01/17/2017   Procedure: LEFT ULNAR HEAD RESECTION;  Surgeon: Carole Civil, MD;  Location: [facility] ORS;  Service: Orthopedics;  Laterality: Left;      Social History:      Social History   Tobacco Use  . Smoking status: Former Smoker    Packs/day: 1.00    Years: 20.00    Pack years: 20.00    Types: Cigarettes    Quit date: 09/30/1969    Years since quitting: 49.2  . Smokeless tobacco: Never Used  Substance Use Topics  . Alcohol use: No       Family History :     Family History  Problem Relation Age of Onset  . Lung cancer Mother   . Cancer Mother        breast  . Diabetes Mother   . Stroke Father   . Hypertension Father   . Kidney failure Brother   . Other Child        blood infection  . Colon cancer Neg Hx        Home Medications:   Prior to Admission medications   Medication Sig Start Date End Date Taking? Authorizing Provider  acetaminophen (TYLENOL) 325 MG tablet Take 650 mg by mouth every 6 (six) hours as needed for moderate pain or headache.   Yes [provider]  amLODipine (NORVASC) 2.5 MG tablet Take 1 tablet (2.5 mg total) by mouth every morning. 11/06/18  Yes [name], MD  aspirin 81 MG tablet Take 1  tablet (81 mg total) by mouth at bedtime. 03/18/18  Yes Patrecia Pour, MD  balsalazide (COLAZAL) 750 MG capsule Take 3 capsules (2,250 mg total) by mouth 3 (three) times daily. 08/26/18 12/21/18 Yes Carlis Stable, NP  doxazosin (CARDURA)  4 MG tablet Take one tablet by mouth at bedtime Patient taking differently: Take 4 mg by mouth at bedtime.  11/06/18  Yes [name], MD  furosemide (LASIX) 40 MG tablet Take 0.5 tablets (20 mg total) by mouth every morning. Patient taking differently: Take 40 mg by mouth every morning.  11/24/18  Yes Lendon Colonel, NP  gabapentin (NEURONTIN) 300 MG capsule Take 300 mg by mouth 2 (two) times daily.   Yes [provider]  loperamide (IMODIUM) 2 MG capsule Take 2 mg by mouth daily.   Yes [provider]  losartan (COZAAR) 25 MG tablet TAKE 1 TABLET BY MOUTH ONCE DAILY IN THE MORNING Patient taking differently: Take 25 mg by mouth daily.  11/24/18  Yes Lendon Colonel, NP  metoprolol tartrate (LOPRESSOR) 50 MG tablet One po QHS Patient taking differently: Take 50 mg by mouth at bedtime.  11/06/18  Yes [name], MD  mupirocin ointment (BACTROBAN) 2 % Apply thin layer to affected area once a day 11/06/18  Yes Luking, Scott A, MD  pravastatin (PRAVACHOL) 40 MG tablet Take 1 tablet (40 mg total) by mouth at bedtime. 11/24/18  Yes Lendon Colonel, NP  sertraline (ZOLOFT) 50 MG tablet TAKE 1 TABLET BY MOUTH ONCE DAILY -  NEEDS  VIRTUAL  VISIT Patient taking differently: Take 50 mg by mouth daily.  12/18/18  Yes [name], MD  tiotropium (SPIRIVA HANDIHALER) 18 MCG inhalation capsule One inhalation po daily 11/06/18  Yes Luking, Scott A, MD  triamcinolone cream (KENALOG) 0.1 % Apply twice daily as needed for psoriasis. 04/22/18  Yes Luking, Elayne Snare, MD  nitroGLYCERIN (NITROSTAT) 0.4 MG SL tablet Place 0.4 mg under the tongue every 5 (five) minutes as needed for chest pain.    [provider]     Allergies:    No Known  Allergies   Physical Exam:   Vitals  Blood pressure (!) 170/69, pulse 69, temperature 98.2 F (36.8 C), temperature source Oral, resp. rate (!) 21, SpO2 93 %.  1.  General: axoxo3  2. Psychiatric: euthymic  3. Neurologic: nonfocal  4. HEENMT:  Anicteric, pupils 1.36m symmetric, direct, consensual, near intact Neck: no jvd  5. Respiratory : Slight crackles left lung base, + bilateral exp wheezing  6. Cardiovascular : rrr s1, s2, no m/g/r  7. Gastrointestinal:  Abd: morbidly obese,  soft, nt, nd, +bs  8. Skin:  Ext: no c/c/e,  No rash  9.Musculoskeletal:  Good ROM    Data Review:    CBC Recent Labs  Lab 12/21/18 1851  WBC 13.0*  HGB 13.4  HCT 43.1  PLT 224  MCV 94.3  MCH 29.3  MCHC 31.1  RDW 13.3  LYMPHSABS 3.1  MONOABS 1.2*  EOSABS 0.1  BASOSABS 0.1   ------------------------------------------------------------------------------------------------------------------  Results for orders placed or performed during the hospital encounter of 12/21/18 (from the past 48 hour(s))  Comprehensive metabolic panel     Status: Abnormal   Collection Time: 12/21/18  6:51 PM  Result Value Ref Range   Sodium 139 135 - 145 mmol/L   Potassium 4.2 3.5 - 5.1 mmol/L   Chloride 107 98 - 111 mmol/L   CO2 26 22 - 32 mmol/L   Glucose, Bld 95 70 - 99 mg/dL   BUN 22 8 - 23 mg/dL   Creatinine, Ser 1.27 (H) 0.61 - 1.24 mg/dL   Calcium 8.8 (L) 8.9 - 10.3 mg/dL   Total Protein 7.1 6.5 - 8.1  g/dL   Albumin 3.6 3.5 - 5.0 g/dL   AST 17 15 - 41 U/L   ALT 14 0 - 44 U/L   Alkaline Phosphatase 64 38 - 126 U/L   Total Bilirubin 0.9 0.3 - 1.2 mg/dL   GFR calc non Af Amer 53 (L) >60 mL/min   GFR calc Af Amer >60 >60 mL/min   Anion gap 6 5 - 15    Comment: Performed at [facility], [place] [address] [place], [place] [address]  CBC with Differential     Status: Abnormal   Collection Time: 12/21/18  6:51 PM  Result Value Ref Range   WBC 13.0 (H) 4.0 - 10.5 K/uL    RBC 4.57 4.22 - 5.81 MIL/uL   Hemoglobin 13.4 13.0 - 17.0 g/dL   HCT 43.1 39.0 - 52.0 %   MCV 94.3 80.0 - 100.0 fL   MCH 29.3 26.0 - 34.0 pg   MCHC 31.1 30.0 - 36.0 g/dL   RDW 13.3 11.5 - 15.5 %   Platelets 224 150 - 400 K/uL   nRBC 0.0 0.0 - 0.2 %   Neutrophils Relative % 65 %   Neutro Abs 8.5 (H) 1.7 - 7.7 K/uL   Lymphocytes Relative 24 %   Lymphs Abs 3.1 0.7 - 4.0 K/uL   Monocytes Relative 9 %   Monocytes Absolute 1.2 (H) 0.1 - 1.0 K/uL   Eosinophils Relative 1 %   Eosinophils Absolute 0.1 0.0 - 0.5 K/uL   Basophils Relative 1 %   Basophils Absolute 0.1 0.0 - 0.1 K/uL   Immature Granulocytes 0 %   Abs Immature Granulocytes 0.04 0.00 - 0.07 K/uL    Comment: Performed at [facility], [place] [address], [place], [place] [address]  Troponin I (High Sensitivity)     Status: None   Collection Time: 12/21/18  6:51 PM  Result Value Ref Range   Troponin I (High Sensitivity) 9 <18 ng/L    Comment: (NOTE) Elevated high sensitivity troponin I (hsTnI) values and significant  changes across serial measurements may suggest ACS but many other  chronic and acute conditions are known to elevate hsTnI results.  Refer to the "Links" section for chest pain algorithms and additional  guidance. Performed at [facility], [place] [address] [place], [place] [address]   Protime-INR     Status: None   Collection Time: 12/21/18  6:51 PM  Result Value Ref Range   Prothrombin Time 14.3 11.4 - 15.2 seconds   INR 1.1 0.8 - 1.2    Comment: (NOTE) INR goal varies based on device and disease states. Performed at [facility], [place] [address] [place], [place] [address]   Brain natriuretic peptide     Status: None   Collection Time: 12/21/18  6:53 PM  Result Value Ref Range   B Natriuretic Peptide 99.5 0.0 - 100.0 pg/mL    Comment: Performed at [facility], [place] [address] [place], [place] [address]    Chemistries  Recent  Labs  Lab 12/21/18 1851  NA 139  K 4.2  CL 107  CO2 26  GLUCOSE 95  BUN 22  CREATININE 1.27*  CALCIUM 8.8*  AST 17  ALT 14  ALKPHOS 64  BILITOT 0.9   ------------------------------------------------------------------------------------------------------------------  ------------------------------------------------------------------------------------------------------------------ GFR: CrCl cannot be calculated (Unknown ideal weight.). Liver Function Tests: Recent Labs  Lab 12/21/18 1851  AST 17  ALT 14  ALKPHOS 64  BILITOT 0.9  PROT  7.1  ALBUMIN 3.6   No results for input(s): LIPASE, AMYLASE in the last 168 hours. No results for input(s): AMMONIA in the last 168 hours. Coagulation Profile: Recent Labs  Lab 12/21/18 1851  INR 1.1   Cardiac Enzymes: No results for input(s): CKTOTAL, CKMB, CKMBINDEX, TROPONINI in the last 168 hours. BNP (last 3 results) No results for input(s): PROBNP in the last 8760 hours. HbA1C: No results for input(s): HGBA1C in the last 72 hours. CBG: No results for input(s): GLUCAP in the last 168 hours. Lipid Profile: No results for input(s): CHOL, HDL, LDLCALC, TRIG, CHOLHDL, LDLDIRECT in the last 72 hours. Thyroid Function Tests: No results for input(s): TSH, T4TOTAL, FREET4, T3FREE, THYROIDAB in the last 72 hours. Anemia Panel: No results for input(s): VITAMINB12, FOLATE, FERRITIN, TIBC, IRON, RETICCTPCT in the last 72 hours.  --------------------------------------------------------------------------------------------------------------- Urine analysis:    Component Value Date/Time   COLORURINE YELLOW 08/04/2014 1620   APPEARANCEUR CLEAR 08/04/2014 1620   LABSPEC 1.020 08/04/2014 1620   PHURINE 6.0 08/04/2014 1620   GLUCOSEU NEGATIVE 08/04/2014 1620   HGBUR SMALL (A) 08/04/2014 1620   BILIRUBINUR NEGATIVE 08/04/2014 1620   BILIRUBINUR ++ 11/18/2012 1319   KETONESUR NEGATIVE 08/04/2014 1620   PROTEINUR NEGATIVE 08/04/2014 1620    UROBILINOGEN 0.2 08/04/2014 1620   NITRITE NEGATIVE 08/04/2014 1620   LEUKOCYTESUR NEGATIVE 08/04/2014 1620      Imaging Results:    Dg Chest Port 1 View  Result Date: 12/21/2018 CLINICAL DATA:  Dyspnea EXAM: PORTABLE CHEST 1 VIEW COMPARISON:  03/15/2018 FINDINGS: Cardiomegaly. Emphysema. The visualized skeletal structures are unremarkable. IMPRESSION: 1. Emphysema without acute abnormality of the lungs in AP portable projection 2.  Cardiomegaly. Electronically Signed   By: Eddie Candle M.D.   On: 12/21/2018 19:38    ekg nsr at 49, borderline LAD, t inversion in 1, avl, v5,6   Assessment & Plan:    Principal Problem:   Acute respiratory failure with hypoxia ([name]) Active Problems:   COPD with acute exacerbation ([place])   CAD S/P percutaneous coronary angioplasty   Essential hypertension   Dyslipidemia, goal LDL below 70  Acute respiratory failure w hypoxia. Copd exacerbation Solumedrol 1m iv q8h, probably can transition to prednisone in am zithromax 5038miv qday spiriva -> Anoro 1puff qday Albuterol HFA 2puff q6h , and q6h prn  Will require o2 on discharge  CAD s/p stent Cont Losartan 2582mo qday Cont Metoprolol 76m75m qhs Cont Pravastatin 40mg8mqhs Cont Aspirin  H/o Diastolic CHF Cont Lasix 40mg 03JKday  Anxiety Cont Zoloft 76mg 58mday  Ulcerative colitis Cont Colazal  Bph Cont Cardura 4mg po33ms  Prediabetes Check hga1c,    DVT Prophylaxis-   Lovenox - SCDs   AM Labs Ordered, also please review Full Orders  Family Communication: Admission, patients condition and plan of care including tests being ordered have been discussed with the patient  who indicate understanding and agree with the plan and Code Status.  Code Status:  FULL CODE per patient,  Daughter in ED present with patient,    Daughter requests an update tomorrow , please give her a call after seeing patient, Thanks  Admission status: Observation: Based on patients clinical presentation  and evaluation of above clinical data, I have made determination that patient meets observation criteria at this time.   Depending upon how the patient responds to treatment may require, inpatient stay  Time spent in minutes : 70    Tanay Misuraca KJani Gravel 12/21/2018 at  8:38 PM

## 2018-12-22 ENCOUNTER — Inpatient Hospital Stay (HOSPITAL_COMMUNITY): Payer: Medicare Other

## 2018-12-22 DIAGNOSIS — N400 Enlarged prostate without lower urinary tract symptoms: Secondary | ICD-10-CM | POA: Diagnosis present

## 2018-12-22 DIAGNOSIS — Z87442 Personal history of urinary calculi: Secondary | ICD-10-CM | POA: Diagnosis not present

## 2018-12-22 DIAGNOSIS — I110 Hypertensive heart disease with heart failure: Secondary | ICD-10-CM | POA: Diagnosis not present

## 2018-12-22 DIAGNOSIS — J441 Chronic obstructive pulmonary disease with (acute) exacerbation: Secondary | ICD-10-CM | POA: Diagnosis not present

## 2018-12-22 DIAGNOSIS — Z79899 Other long term (current) drug therapy: Secondary | ICD-10-CM | POA: Diagnosis not present

## 2018-12-22 DIAGNOSIS — K5190 Ulcerative colitis, unspecified, without complications: Secondary | ICD-10-CM | POA: Diagnosis present

## 2018-12-22 DIAGNOSIS — Z8711 Personal history of peptic ulcer disease: Secondary | ICD-10-CM | POA: Diagnosis not present

## 2018-12-22 DIAGNOSIS — J9621 Acute and chronic respiratory failure with hypoxia: Secondary | ICD-10-CM | POA: Diagnosis not present

## 2018-12-22 DIAGNOSIS — K219 Gastro-esophageal reflux disease without esophagitis: Secondary | ICD-10-CM | POA: Diagnosis present

## 2018-12-22 DIAGNOSIS — Z955 Presence of coronary angioplasty implant and graft: Secondary | ICD-10-CM | POA: Diagnosis not present

## 2018-12-22 DIAGNOSIS — J449 Chronic obstructive pulmonary disease, unspecified: Secondary | ICD-10-CM | POA: Diagnosis not present

## 2018-12-22 DIAGNOSIS — Z87891 Personal history of nicotine dependence: Secondary | ICD-10-CM | POA: Diagnosis not present

## 2018-12-22 DIAGNOSIS — J9610 Chronic respiratory failure, unspecified whether with hypoxia or hypercapnia: Secondary | ICD-10-CM | POA: Diagnosis not present

## 2018-12-22 DIAGNOSIS — J9690 Respiratory failure, unspecified, unspecified whether with hypoxia or hypercapnia: Secondary | ICD-10-CM | POA: Diagnosis present

## 2018-12-22 DIAGNOSIS — Z7951 Long term (current) use of inhaled steroids: Secondary | ICD-10-CM | POA: Diagnosis not present

## 2018-12-22 DIAGNOSIS — R0600 Dyspnea, unspecified: Secondary | ICD-10-CM | POA: Diagnosis not present

## 2018-12-22 DIAGNOSIS — Z20828 Contact with and (suspected) exposure to other viral communicable diseases: Secondary | ICD-10-CM | POA: Diagnosis present

## 2018-12-22 DIAGNOSIS — R0602 Shortness of breath: Secondary | ICD-10-CM | POA: Diagnosis present

## 2018-12-22 DIAGNOSIS — Z7982 Long term (current) use of aspirin: Secondary | ICD-10-CM | POA: Diagnosis not present

## 2018-12-22 DIAGNOSIS — Z6835 Body mass index (BMI) 35.0-35.9, adult: Secondary | ICD-10-CM | POA: Diagnosis not present

## 2018-12-22 DIAGNOSIS — G4733 Obstructive sleep apnea (adult) (pediatric): Secondary | ICD-10-CM | POA: Diagnosis present

## 2018-12-22 DIAGNOSIS — T380X5A Adverse effect of glucocorticoids and synthetic analogues, initial encounter: Secondary | ICD-10-CM | POA: Diagnosis present

## 2018-12-22 DIAGNOSIS — E785 Hyperlipidemia, unspecified: Secondary | ICD-10-CM | POA: Diagnosis present

## 2018-12-22 DIAGNOSIS — F419 Anxiety disorder, unspecified: Secondary | ICD-10-CM | POA: Diagnosis present

## 2018-12-22 DIAGNOSIS — I5032 Chronic diastolic (congestive) heart failure: Secondary | ICD-10-CM | POA: Diagnosis not present

## 2018-12-22 DIAGNOSIS — J9622 Acute and chronic respiratory failure with hypercapnia: Secondary | ICD-10-CM | POA: Diagnosis not present

## 2018-12-22 DIAGNOSIS — I25119 Atherosclerotic heart disease of native coronary artery with unspecified angina pectoris: Secondary | ICD-10-CM | POA: Diagnosis not present

## 2018-12-22 DIAGNOSIS — R7303 Prediabetes: Secondary | ICD-10-CM | POA: Diagnosis present

## 2018-12-22 LAB — CBC
HCT: 42.2 % (ref 39.0–52.0)
Hemoglobin: 13.1 g/dL (ref 13.0–17.0)
MCH: 29.5 pg (ref 26.0–34.0)
MCHC: 31 g/dL (ref 30.0–36.0)
MCV: 95 fL (ref 80.0–100.0)
Platelets: 196 10*3/uL (ref 150–400)
RBC: 4.44 MIL/uL (ref 4.22–5.81)
RDW: 13.1 % (ref 11.5–15.5)
WBC: 9 10*3/uL (ref 4.0–10.5)
nRBC: 0 % (ref 0.0–0.2)

## 2018-12-22 LAB — COMPREHENSIVE METABOLIC PANEL
ALT: 14 U/L (ref 0–44)
AST: 17 U/L (ref 15–41)
Albumin: 3.2 g/dL — ABNORMAL LOW (ref 3.5–5.0)
Alkaline Phosphatase: 62 U/L (ref 38–126)
Anion gap: 8 (ref 5–15)
BUN: 22 mg/dL (ref 8–23)
CO2: 25 mmol/L (ref 22–32)
Calcium: 8.9 mg/dL (ref 8.9–10.3)
Chloride: 107 mmol/L (ref 98–111)
Creatinine, Ser: 1.15 mg/dL (ref 0.61–1.24)
GFR calc Af Amer: >60 mL/min (ref >60)
GFR calc non Af Amer: 60 mL/min — ABNORMAL LOW (ref >60)
Glucose, Bld: 203 mg/dL — ABNORMAL HIGH (ref 70–99)
Potassium: 4.5 mmol/L (ref 3.5–5.1)
Sodium: 140 mmol/L (ref 135–145)
Total Bilirubin: 0.5 mg/dL (ref 0.3–1.2)
Total Protein: 7 g/dL (ref 6.5–8.1)

## 2018-12-22 LAB — TROPONIN I (HIGH SENSITIVITY)
Troponin I (High Sensitivity): 10 ng/L (ref <18)
Troponin I (High Sensitivity): 17 ng/L (ref <18)

## 2018-12-22 LAB — GLUCOSE, CAPILLARY
Glucose-Capillary: 245 mg/dL — ABNORMAL HIGH (ref 70–99)
Glucose-Capillary: 237 mg/dL — ABNORMAL HIGH (ref 70–99)

## 2018-12-22 LAB — HEMOGLOBIN A1C
Hgb A1c MFr Bld: 5.9 % — ABNORMAL HIGH (ref 4.8–5.6)
Mean Plasma Glucose: 122.63 mg/dL

## 2018-12-22 LAB — SARS CORONAVIRUS 2 (TAT 6-24 HRS): SARS Coronavirus 2: NEGATIVE

## 2018-12-22 LAB — D-DIMER, QUANTITATIVE: D-Dimer, Quant: 0.88 ug/mL-FEU — ABNORMAL HIGH (ref 0.00–0.50)

## 2018-12-22 MED ORDER — SODIUM CHLORIDE (PF) 0.9 % IJ SOLN
INTRAMUSCULAR | Status: AC
  Filled 2018-12-22: qty 50

## 2018-12-22 MED ORDER — ALBUTEROL SULFATE HFA 108 (90 BASE) MCG/ACT IN AERS: 2.0000 | INHALATION_SPRAY | Freq: Three times a day (TID) | RESPIRATORY_TRACT | Status: DC

## 2018-12-22 MED ORDER — IOHEXOL 350 MG/ML SOLN
100.0000 mL | Freq: Once | INTRAVENOUS | Status: AC | PRN
  Administered 2018-12-22: 18:00:00 100 mL via INTRAVENOUS

## 2018-12-22 MED ORDER — NITROGLYCERIN 2 % TD OINT
1.0000 [in_us] | TOPICAL_OINTMENT | Freq: Four times a day (QID) | TRANSDERMAL | Status: DC | PRN
  Administered 2018-12-22: 1 [in_us] via TOPICAL
  Filled 2018-12-22: qty 30

## 2018-12-22 MED ORDER — INSULIN ASPART 100 UNIT/ML ~~LOC~~ SOLN
0.0000 [IU] | Freq: Every day | SUBCUTANEOUS | Status: DC
  Administered 2018-12-22: 2 [IU] via SUBCUTANEOUS

## 2018-12-22 MED ORDER — INSULIN ASPART 100 UNIT/ML ~~LOC~~ SOLN
0.0000 [IU] | Freq: Three times a day (TID) | SUBCUTANEOUS | Status: DC
  Administered 2018-12-22: 17:00:00 5 [IU] via SUBCUTANEOUS
  Administered 2018-12-23: 3 [IU] via SUBCUTANEOUS

## 2018-12-22 MED ORDER — AZITHROMYCIN 250 MG PO TABS
500.0000 mg | ORAL_TABLET | Freq: Every day | ORAL | Status: DC
  Administered 2018-12-22: 22:00:00 500 mg via ORAL
  Filled 2018-12-22: qty 2

## 2018-12-22 NOTE — Progress Notes (Signed)
    Durable Medical Equipment  (From admission, onward)         Start     Ordered   12/22/18 1214  For home use only DME oxygen  Once    Question Answer Comment  Length of Need Lifetime   Mode or (Route) Nasal cannula   Liters per Minute 4   Frequency Continuous (stationary and portable oxygen unit needed)   Oxygen conserving device No   Oxygen delivery system Gas      12/22/18 1214

## 2018-12-22 NOTE — TOC Initial Note (Signed)
Transition of Care [facility]) - Initial/Assessment Note    Patient Details  Name: [name] MRN: [id number] Date of Birth: [birth year]  Transition of Care [facility]) CM/SW Contact:    Dessa Phi, RN Phone Number: 12/22/2018, 11:48 AM  Clinical Narrative:  Patient defers to dtr Angie-left vm w/call back tel# to discuss d/c plans.                 Expected Discharge Plan: [place] Barriers to Discharge: Continued Medical Work up   Patient Goals and CMS Choice Patient states their goals for this hospitalization and ongoing recovery are:: go home CMS Medicare.gov Compare Post Acute Care list provided to:: Patient Represenative (must comment)(dtr Angie) Choice offered to / list presented to : Adult Children  Expected Discharge Plan and Services Expected Discharge Plan: [place]   Discharge Planning Services: CM Consult Post Acute Care Choice: [place] arrangements for the past 2 months: Single Family Home                                      Prior Living Arrangements/Services Living arrangements for the past 2 months: Single Family Home Lives with:: Adult Children Patient language and need for interpreter reviewed:: Yes Do you feel safe going back to the place where you live?: Yes      Need for Family Participation in Patient Care: No (Comment) Care giver support system in place?: Yes (comment)   Criminal Activity/Legal Involvement Pertinent to Current Situation/Hospitalization: No - Comment as needed  Activities of Daily Living Home Assistive Devices/Equipment: Shower chair with back, Eyeglasses, Hearing aid ADL Screening (condition at time of admission) Patient's cognitive ability adequate to safely complete daily activities?: Yes Is the patient deaf or have difficulty hearing?: Yes Does the patient have difficulty seeing, even when wearing glasses/contacts?: No Does the patient have difficulty concentrating,  remembering, or making decisions?: No Patient able to express need for assistance with ADLs?: Yes Does the patient have difficulty dressing or bathing?: No Independently performs ADLs?: No Communication: Independent Dressing (OT): Needs assistance(daughter helps with shoes and socks) Grooming: Independent Feeding: Independent Bathing: Independent with device (comment)(bath chair) Toileting: Independent with device (comment)(device used to help wipe with toilet paper) In/Out Bed: Independent Walks in Home: Independent Does the patient have difficulty walking or climbing stairs?: Yes Weakness of Legs: Both Weakness of Arms/Hands: None  Permission Sought/Granted Permission sought to share information with : Case Manager Permission granted to share information with : Yes, Verbal Permission Granted  Share Information with NAME: Nefi Musich     Permission granted to share info w Relationship: dtr  Permission granted to share info w Contact Information: [phone]  Emotional Assessment Appearance:: Appears stated age Attitude/Demeanor/Rapport: Gracious Affect (typically observed): Accepting Orientation: : Oriented to Self, Oriented to Place, Oriented to  Time, Oriented to Situation Alcohol / Substance Use: Not Applicable Psych Involvement: No (comment)  Admission diagnosis:  COPD exacerbation ([name]) [J44.1] Dyspnea, unspecified type [R06.00] Patient Active Problem List   Diagnosis Date Noted  . Acute respiratory failure with hypoxia ([place]) 12/21/2018  . Ankylosing spondylitis ([place]) 11/07/2018  . Rectal bleeding 08/26/2018  . Chronic diastolic CHF (congestive heart failure) ([place]) 03/27/2018  . Exertional dyspnea 03/15/2018  . Dyspnea 03/15/2018  . Morbid obesity ([place]) 03/12/2018  . History of carpal tunnel surgery of left wrist with unlar head resection 01/17/17 01/24/2017  .  Closed fracture of distal radius and ulna, left, with malunion, subsequent encounter   . Carpal  tunnel syndrome of left wrist   . Fracture, [place], left, closed 11/29/2016  . Hx of skin cancer, basal cell 11/30/2015  . Erectile dysfunction 06/06/2015  . Iron deficiency 08/18/2014  . Fecal incontinence 02/10/2014  . Essential hypertension 01/09/2013  . Dyslipidemia, goal LDL below 70 01/09/2013  . Chest pain at rest 12/14/2012  . CAD S/P percutaneous coronary angioplasty 12/14/2012  . Hiatal hernia 12/14/2012  . GERD (gastroesophageal reflux disease) 12/14/2012  . Prediabetes 12/09/2012  . BPH (benign prostatic hyperplasia) 11/18/2012  . RUQ pain 09/04/2012  . Gallstones 05/09/2012  . COPD with acute exacerbation ([place]) 04/30/2012  . Obstructive sleep apnea 04/30/2012  . Ulcerative colitis ([place]) 04/29/2012  . Ulcerative colitis, left sided ([place]) 12/26/2011  . Diarrhea 10/18/2010   PCP:  [name], MD Pharmacy:   [facility] [place], [place] [address] Phone: [phone] Fax: [phone]  [facility] [address] [place]), [place] - [address] [place]) [place] [address] Phone: [phone] Fax: [phone]     Social Determinants of Health (SDOH) Interventions    Readmission Risk Interventions No flowsheet data found.

## 2018-12-22 NOTE — Care Management Obs Status (Signed)
[place] NOTIFICATION   Patient Details  Name: FAROUK VIVERO MRN: [id number] Date of Birth: 04-Sep-1940   Medicare Observation Status Notification Given:  Yes    MahabirJuliann Pulse, RN 12/22/2018, 3:49 PM

## 2018-12-22 NOTE — TOC Progression Note (Signed)
Transition of Care [facility]) - Progression Note    Patient Details  Name: REILLY BLADES MRN: [id number] Date of Birth: 03-Jan-1941  Transition [facility] Care [facility]) CM/SW Contact  Lyzette Reinhardt, Juliann Pulse, RN Phone Number: 12/22/2018, 3:48 PM  Clinical Narrative:  PT recc HHPT-recc HHRN/PT/aide-spoke to dtr Angie on phone agree-await [facility] agency to accept.     Expected Discharge Plan: [place] Barriers to Discharge: Continued Medical Work up  Expected Discharge Plan and Services Expected Discharge Plan: [place]   Discharge Planning Services: CM Consult Post Acute Care Choice: Durable Medical Equipment Living arrangements for the past 2 months: Single Family Home                 DME Arranged: Oxygen DME Agency: [employer] Date DME Agency Contacted: 12/22/18 Time DME Agency Contacted: 1216 Representative spoke with at DME Agency: [place] ([place]) Interventions    Readmission Risk Interventions No flowsheet data found.

## 2018-12-22 NOTE — TOC Progression Note (Signed)
Transition of Care [facility]) - Progression Note    Patient Details  Name: DURANTE VIOLETT MRN: [id number] Date of Birth: 14-May-1940  Transition [facility]) CM/SW Contact  Kynnadi Dicenso, Juliann Pulse, RN Phone Number: 12/22/2018, 12:17 PM  Clinical Narrative:  [employer] rep Zach aware of home 02 order-will deliver travel tank to rm prior d/c.     Expected Discharge Plan: [place] Barriers to Discharge: Continued Medical Work up  Expected Discharge Plan and Services Expected Discharge Plan: [place]   Discharge Planning Services: CM Consult Post Acute Care Choice: Durable Medical Equipment Living arrangements for the past 2 months: Single Family Home                 DME Arranged: Oxygen DME Agency: [employer] Date DME Agency Contacted: 12/22/18 Time DME Agency Contacted: 1216 Representative spoke with at DME Agency: [place] ([place]) Interventions    Readmission Risk Interventions No flowsheet data found.

## 2018-12-22 NOTE — Progress Notes (Signed)
Report received from [place]. Agree with previous assessment. Maintain current plan of care

## 2018-12-22 NOTE — Progress Notes (Signed)
MD updated via phone regarding lab results of D Dimer 0.88 and Troponin Level of 17. New ordres to be placed by the MD. No other acute changes noted in Pt's assessment at this time.

## 2018-12-22 NOTE — Progress Notes (Addendum)
SATURATION QUALIFICATIONS: (This note is used to comply with regulatory documentation for home oxygen)  Patient Saturations on Room Air at Rest = 88%  Patient Saturations on Room Air while Ambulating = 88%  Patient Saturations on 4 Liters of oxygen while Ambulating = 88%  Please briefly explain why patient needs home oxygen:Pt is able maintain O2 saturation on 4L at 92-94% at rest. When patient ambulated he desaturates to 88% on 4L.

## 2018-12-22 NOTE — Progress Notes (Signed)
PHARMACIST - PHYSICIAN COMMUNICATION  CONCERNING: Antibiotic IV to Oral Route Change Policy  RECOMMENDATION: This patient is receiving azithromycin by the intravenous route.  Based on criteria approved by the Pharmacy and Therapeutics Committee, the antibiotic(s) is/are being converted to the equivalent oral dose form(s).   DESCRIPTION: These criteria include:  Patient being treated for a respiratory tract infection, urinary tract infection, cellulitis or clostridium difficile associated diarrhea if on metronidazole  The patient is not neutropenic and does not exhibit a GI malabsorption state  The patient is eating (either orally or via tube) and/or has been taking other orally administered medications for a least 24 hours  The patient is improving clinically and has a Tmax < 100.5  If you have questions about this conversion, please contact the Pharmacy Department  []   [phone] )  [facility] []   [phone] )  [facility] []   [phone] )  [facility] []   [phone] )  [facility] [x]   [phone] )  [place], [place].D [phone] 12/22/2018 1:46 PM

## 2018-12-22 NOTE — TOC Progression Note (Signed)
Transition of Care [facility]) - Progression Note    Patient Details  Name: [name] MRN: [id number] Date of Birth: 1940/06/22  [facility]) CM/SW Contact  Zenita Kister, Juliann Pulse, RN Phone Number: 12/22/2018, 12:12 PM  Clinical Narrative: noted qualifies for home 02-await home 02 order. Dx: COPD-rep Zach for [employer] dme aware to deliver home 02 travel tank to rm prior d/c.      Expected Discharge Plan: [place] Barriers to Discharge: Continued Medical Work up  Expected Discharge Plan and Services Expected Discharge Plan: [place]   Discharge Planning Services: CM Consult Post Acute Care Choice: [place] arrangements for the past 2 months: Single Family Home                                       Social Determinants of Health (SDOH) Interventions    Readmission Risk Interventions No flowsheet data found.

## 2018-12-22 NOTE — Evaluation (Signed)
Physical Therapy Evaluation Patient Details Name: [name] MRN: [id number] DOB: 1940-08-25 Today's Date: 12/22/2018   History of Present Illness  Rui Wordell  is a 78 y.o. male, w hypertension, hyperlipidemia, Glucose intolerance, CAD s/p stent, Diastolic CHF??? , Gerd, PUD, Ulcerative colitis, Copd presents with c/o dyspnea and cough w green sputum as well as wheezing.  Clinical Impression  Pt admitted with above diagnosis.  Pt currently with functional limitations due to the deficits listed below (see PT Problem List). Pt will benefit from skilled PT to increase their independence and safety with mobility to allow discharge to the venue listed below.  Pt de-sat to 85% on 4 L/min with gait.  90-91% on 6 L/min.  Encouraged pt to use RW at this time and he verbalized understanding. Recommend HHPT.     Follow Up Recommendations Home health PT;Supervision - Intermittent    Equipment Recommendations  None recommended by PT    Recommendations for Other Services       Precautions / Restrictions Precautions Precautions: Other (comment) Precaution Comments: monitor o2 sats Restrictions Weight Bearing Restrictions: No      Mobility  Bed Mobility Overal bed mobility: Needs Assistance Bed Mobility: Sit to Supine       Sit to supine: Supervision      Transfers Overall transfer level: Needs assistance   Transfers: Sit to/from Stand Sit to Stand: Supervision         General transfer comment: uses momentum, but no physical assist given  Ambulation/Gait Ambulation/Gait assistance: Min guard Gait Distance (Feet): 120 Feet Assistive device: Rolling walker (2 wheeled) Gait Pattern/deviations: Step-through pattern Gait velocity: cues to slow down for safety   General Gait Details: Amb on 4 L/min 60' with o2 dropping to 85%.  Increased o2 to 6 L/min and 90-91% at end of next 23' Cued for pursed lip breathing.  Stairs            Wheelchair Mobility     Modified Rankin (Stroke Patients Only)       Balance Overall balance assessment: Mild deficits observed, not formally tested;Needs assistance           Standing balance-Leahy Scale: Fair                               Pertinent Vitals/Pain Pain Assessment: No/denies pain    Home Living Family/patient expects to be discharged to:: Private residence Living Arrangements: Children Available Help at Discharge: Available 24 hours/day Type of Home: House Home Access: Stairs to enter   [employer] of Steps: 1 Home Layout: One level Home Equipment: [place] - 4 wheels;Walker - 2 wheels Additional Comments: lift chair    Prior Function Level of Independence: Independent         Comments: doesn't use rollator     Hand Dominance   Dominant Hand: Right    Extremity/Trunk Assessment   Upper Extremity Assessment Upper Extremity Assessment: Overall WFL for tasks assessed    Lower Extremity Assessment Lower Extremity Assessment: Overall WFL for tasks assessed    Cervical / Trunk Assessment Cervical / Trunk Assessment: (limited cervical ROM due to surgery)  Communication   Communication: HOH  Cognition Arousal/Alertness: Awake/alert Behavior During Therapy: WFL for tasks assessed/performed Overall Cognitive Status: Within Functional Limits for tasks assessed  General Comments      Exercises     Assessment/Plan    PT Assessment Patient needs continued PT services  PT Problem List Decreased activity tolerance;Decreased balance;Decreased mobility;Cardiopulmonary status limiting activity       PT Treatment Interventions DME instruction;Gait training;Functional mobility training;Therapeutic activities;Therapeutic exercise;Balance training;Patient/family education    PT Goals (Current goals can be found in the Care Plan section)  Acute Rehab PT Goals Patient Stated Goal: home PT Goal  Formulation: With patient Time For Goal Achievement: 01/05/19 Potential to Achieve Goals: Good    Frequency Min 3X/week   Barriers to discharge        Co-evaluation               AM-PAC PT "6 Clicks" Mobility  Outcome Measure Help needed turning from your back to your side while in a flat bed without using bedrails?: A Little Help needed moving from lying on your back to sitting on the side of a flat bed without using bedrails?: A Little Help needed moving to and from a bed to a chair (including a wheelchair)?: A Little Help needed standing up from a chair using your arms (e.g., wheelchair or bedside chair)?: None Help needed to walk in hospital room?: A Little Help needed climbing 3-5 steps with a railing? : A Little 6 Click Score: 19    End of Session Equipment Utilized During Treatment: Gait belt;Oxygen Activity Tolerance: Patient tolerated treatment well Patient left: in bed;with call bell/phone within reach Nurse Communication: Mobility status PT Visit Diagnosis: Other abnormalities of gait and mobility (R26.89)    Time: 1561-5379 PT Time Calculation (min) (ACUTE ONLY): 21 min   Charges:   PT Evaluation $PT Eval Low Complexity: 1 Low          Seena Ritacco L. Tamala Julian, [place] Pager [phone] 12/22/2018   Galen Manila 12/22/2018, 1:04 PM

## 2018-12-22 NOTE — Care Management Obs Status (Signed)
[facility] NOTIFICATION   Patient Details  Name: [name] MRN: [id number] Date of Birth: 02-Dec-1940   Medicare Observation Status Notification Given:  Yes    MahabirJuliann Pulse, RN 12/22/2018, 3:49 PM

## 2018-12-22 NOTE — Progress Notes (Signed)
PROGRESS NOTE    JANN RA  PXT:[id number] DOB: [birth year] DOA: 12/21/2018 PCP: [name], MD    Brief Narrative:  [name]  is a 78 y.o. male, w hypertension, hyperlipidemia, Glucose intolerance, CAD s/p stent, Diastolic CHF??? (per [place] NP office note), Gerd, PUD, Ulcerative colitis, Copd presents with c/o dyspnea worse today. Cough w green sputum. Wheezing.  Patient has been admitted with acute hypoxic respiratory failure secondary to COPD exacerbation.   He has been started on Solu-Medrol and azithromycin.  December 22 2018   Patient seen and examined.  He feels somewhat better than yesterday.  Still requiring about 4 L of oxygen, desaturates quickly with exertion.  He does not use oxygen at home.  He denies any other complaint such as fever chills chest pain cough.  Blood sugars are elevated secondary to steroids.  Case management consult to arrange for home oxygen   12 point review system is negative except for mentioned  Assessment & Plan:   Principal Problem:   Acute respiratory failure with hypoxia ([place]) Active Problems:   COPD with acute exacerbation ([name])   CAD S/P percutaneous coronary angioplasty   Essential hypertension   Dyslipidemia, goal LDL below 70   Respiratory failure ([name])   Acute respiratory failure w hypoxia. Copd exacerbation Solumedrol 18m iv q8h, probably can transition to prednisone in am if symptomatically better zithromax 5065mp.o. for total 3 days spiriva -> Anoro 1puff qday Albuterol HFA 2puff q6h , and q6h prn  Will require o2 on discharge, case management consulted Outpatient follow-up with pulmonology   CAD s/p stent Cont Losartan 2587mo qday Cont Metoprolol 66m88m qhs Cont Pravastatin 40mg41mqhs Cont Aspirin  H/o Diastolic CHF Cont Lasix 40mg 62VOday  Anxiety Cont Zoloft 66mg 70mday  Ulcerative colitis Cont Colazal  Bph Cont Cardura 4mg po51ms  Prediabetes A1c is normal.  Continue  sliding scale for hyperglycemia due to steroids   DVT Prophylaxis-   Lovenox - SCDs    DVT Prophylaxis-   Lovenox - SCDs   AM Labs Ordered, also please review Full Orders  Family Communication: Admission, patients condition and plan of care including tests being ordered have been discussed with the patient  who indicate understanding and agree with the plan and Code Status.  No family by bedside  Code Status:  FULL CODE per patient,     Admission status:   We will change the patient to inpatient status given that he is requiring high levels of oxygen which is relatively new to him, he is still having exertional dyspnea and required IV steroids      Objective: Vitals:   12/22/18 0842 12/22/18 0916 12/22/18 1338 12/22/18 1338  BP:  (!) 173/64  (!) 145/63  Pulse:    79  Resp:      Temp:    97.6 F (36.4 C)  TempSrc:    Oral  SpO2: 91% 93% 91%   Weight:      Height:        Intake/Output Summary (Last 24 hours) at 12/22/2018 1404 Last data filed at 12/22/2018 1243 Gross per 24 hour  Intake 1220 ml  Output 1075 ml  Net 145 ml   Filed Weights   12/21/18 2214 12/22/18 0448  Weight: (!) 140.2 kg (!) 140.2 kg    Examination:  General exam: Appears calm and comfortable  Respiratory system: Bilateral rhonchi, no crackles or wheezing respiratory effort normal. Cardiovascular system: S1 & S2 heard,  RRR. No JVD, murmurs, rubs, gallops or clicks. No pedal edema. Gastrointestinal system: Abdomen is nondistended, soft and nontender. No organomegaly or masses felt. Normal bowel sounds heard. Central nervous system: Alert and oriented. No focal neurological deficits. Extremities: Symmetric 5 x 5 power. Skin: No rashes, lesions or ulcers Psychiatry: Judgement and insight appear normal. Mood & affect appropriate.     Data Reviewed: I have personally reviewed following labs and imaging studies  CBC: Recent Labs  Lab 12/21/18 1851 12/22/18 0419  WBC 13.0* 9.0   NEUTROABS 8.5*  --   HGB 13.4 13.1  HCT 43.1 42.2  MCV 94.3 95.0  PLT 224 341   Basic Metabolic Panel: Recent Labs  Lab 12/21/18 1851 12/22/18 0419  NA 139 140  K 4.2 4.5  CL 107 107  CO2 26 25  GLUCOSE 95 203*  BUN 22 22  CREATININE 1.27* 1.15  CALCIUM 8.8* 8.9   GFR: Estimated Creatinine Clearance: 75.4 mL/min (by C-G formula based on SCr of 1.15 mg/dL). Liver Function Tests: Recent Labs  Lab 12/21/18 1851 12/22/18 0419  AST 17 17  ALT 14 14  ALKPHOS 64 62  BILITOT 0.9 0.5  PROT 7.1 7.0  ALBUMIN 3.6 3.2*   No results for input(s): LIPASE, AMYLASE in the last 168 hours. No results for input(s): AMMONIA in the last 168 hours. Coagulation Profile: Recent Labs  Lab 12/21/18 1851  INR 1.1   Cardiac Enzymes: No results for input(s): CKTOTAL, CKMB, CKMBINDEX, TROPONINI in the last 168 hours. BNP (last 3 results) No results for input(s): PROBNP in the last 8760 hours. HbA1C: Recent Labs    12/22/18 0419  HGBA1C 5.9*   CBG: No results for input(s): GLUCAP in the last 168 hours. Lipid Profile: No results for input(s): CHOL, HDL, LDLCALC, TRIG, CHOLHDL, LDLDIRECT in the last 72 hours. Thyroid Function Tests: No results for input(s): TSH, T4TOTAL, FREET4, T3FREE, THYROIDAB in the last 72 hours. Anemia Panel: No results for input(s): VITAMINB12, FOLATE, FERRITIN, TIBC, IRON, RETICCTPCT in the last 72 hours. Sepsis Labs: No results for input(s): PROCALCITON, LATICACIDVEN in the last 168 hours.  Recent Results (from the past 240 hour(s))  SARS CORONAVIRUS 2 (TAT 6-24 HRS) Nasopharyngeal Nasopharyngeal Swab     Status: None   Collection Time: 12/21/18  6:39 PM   Specimen: Nasopharyngeal Swab  Result Value Ref Range Status   SARS Coronavirus 2 NEGATIVE NEGATIVE Final    Comment: (NOTE) SARS-CoV-2 target nucleic acids are NOT DETECTED. The SARS-CoV-2 RNA is generally detectable in upper and lower respiratory specimens during the acute phase of infection.  Negative results do not preclude SARS-CoV-2 infection, do not rule out co-infections with other pathogens, and should not be used as the sole basis for treatment or other patient management decisions. Negative results must be combined with clinical observations, patient history, and epidemiological information. The expected result is Negative. Fact Sheet for Patients: SugarRoll.be Fact Sheet for Healthcare Providers: https://www.woods-mathews.com/ This test is not yet approved or cleared by the [place] FDA and  has been authorized for detection and/or diagnosis of SARS-CoV-2 by FDA under an Emergency Use Authorization (EUA). This EUA will remain  in effect (meaning this test can be used) for the duration of the COVID-19 declaration under Section 56 4(b)(1) of the Act, 21 U.S.C. section 360bbb-3(b)(1), unless the authorization is terminated or revoked sooner. Performed at [facility], [place] [address], [place], [place] [address]          Radiology Studies: Dg  Chest Port 1 View  Result Date: 12/21/2018 CLINICAL DATA:  Dyspnea EXAM: PORTABLE CHEST 1 VIEW COMPARISON:  03/15/2018 FINDINGS: Cardiomegaly. Emphysema. The visualized skeletal structures are unremarkable. IMPRESSION: 1. Emphysema without acute abnormality of the lungs in AP portable projection 2.  Cardiomegaly. Electronically Signed   By: Eddie Candle M.D.   On: 12/21/2018 19:38        Scheduled Meds: . albuterol  2 puff Inhalation Q6H  . amLODipine  2.5 mg Oral q morning - 10a  . aspirin EC  81 mg Oral QHS  . azithromycin  500 mg Oral QHS  . balsalazide  2,250 mg Oral TID  . doxazosin  4 mg Oral QHS  . enoxaparin (LOVENOX) injection  40 mg Subcutaneous Q24H  . furosemide  40 mg Oral q morning - 10a  . gabapentin  300 mg Oral BID  . insulin aspart  0-15 Units Subcutaneous TID WC  . insulin aspart  0-5 Units Subcutaneous QHS  . loperamide  2 mg Oral Daily  .  losartan  25 mg Oral Daily  . methylPREDNISolone (SOLU-MEDROL) injection  80 mg Intravenous Q8H  . metoprolol tartrate  50 mg Oral QHS  . pravastatin  40 mg Oral QHS  . sertraline  50 mg Oral Daily  . sodium chloride flush  3 mL Intravenous Q12H  . umeclidinium-vilanterol  1 puff Inhalation Daily   Continuous Infusions: . sodium chloride       LOS: 0 days    Time spent: 40     Addalie Calles Harmon Pier, MD [facility] Pager [phone]  If 7PM-7AM, please contact night-coverage [email or web address] Password [email or web address] 12/22/2018, 2:04 PM

## 2018-12-23 ENCOUNTER — Telehealth: Payer: Self-pay | Admitting: Family Medicine

## 2018-12-23 DIAGNOSIS — J9621 Acute and chronic respiratory failure with hypoxia: Secondary | ICD-10-CM

## 2018-12-23 DIAGNOSIS — J9600 Acute respiratory failure, unspecified whether with hypoxia or hypercapnia: Secondary | ICD-10-CM

## 2018-12-23 DIAGNOSIS — J449 Chronic obstructive pulmonary disease, unspecified: Secondary | ICD-10-CM

## 2018-12-23 LAB — CBC WITH DIFFERENTIAL/PLATELET
Abs Immature Granulocytes: 0.07 10*3/uL (ref 0.00–0.07)
Basophils Absolute: 0 10*3/uL (ref 0.0–0.1)
Basophils Relative: 0 %
Eosinophils Absolute: 0 10*3/uL (ref 0.0–0.5)
Eosinophils Relative: 0 %
HCT: 40.1 % (ref 39.0–52.0)
Hemoglobin: 12.7 g/dL — ABNORMAL LOW (ref 13.0–17.0)
Immature Granulocytes: 1 %
Lymphocytes Relative: 13 %
Lymphs Abs: 1.6 10*3/uL (ref 0.7–4.0)
MCH: 29.7 pg (ref 26.0–34.0)
MCHC: 31.7 g/dL (ref 30.0–36.0)
MCV: 93.7 fL (ref 80.0–100.0)
Monocytes Absolute: 0.5 10*3/uL (ref 0.1–1.0)
Monocytes Relative: 4 %
Neutro Abs: 10.7 10*3/uL — ABNORMAL HIGH (ref 1.7–7.7)
Neutrophils Relative %: 82 %
Platelets: 208 10*3/uL (ref 150–400)
RBC: 4.28 MIL/uL (ref 4.22–5.81)
RDW: 13.2 % (ref 11.5–15.5)
WBC: 12.9 10*3/uL — ABNORMAL HIGH (ref 4.0–10.5)
nRBC: 0 % (ref 0.0–0.2)

## 2018-12-23 LAB — BASIC METABOLIC PANEL
Anion gap: 6 (ref 5–15)
BUN: 29 mg/dL — ABNORMAL HIGH (ref 8–23)
CO2: 25 mmol/L (ref 22–32)
Calcium: 9.1 mg/dL (ref 8.9–10.3)
Chloride: 106 mmol/L (ref 98–111)
Creatinine, Ser: 1.09 mg/dL (ref 0.61–1.24)
GFR calc Af Amer: >60 mL/min (ref >60)
GFR calc non Af Amer: >60 mL/min (ref >60)
Glucose, Bld: 178 mg/dL — ABNORMAL HIGH (ref 70–99)
Potassium: 4.5 mmol/L (ref 3.5–5.1)
Sodium: 137 mmol/L (ref 135–145)

## 2018-12-23 LAB — GLUCOSE, CAPILLARY
Glucose-Capillary: 158 mg/dL — ABNORMAL HIGH (ref 70–99)
Glucose-Capillary: 205 mg/dL — ABNORMAL HIGH (ref 70–99)

## 2018-12-23 MED ORDER — PREDNISONE 20 MG PO TABS: ORAL_TABLET | ORAL | 0 refills | Status: DC

## 2018-12-23 MED ORDER — CLONAZEPAM 0.5 MG PO TABS: ORAL_TABLET | ORAL | 0 refills | Status: DC

## 2018-12-23 MED ORDER — FUROSEMIDE 40 MG PO TABS: 40.0000 mg | ORAL_TABLET | Freq: Every morning | ORAL | Status: DC

## 2018-12-23 MED ORDER — ALBUTEROL SULFATE HFA 108 (90 BASE) MCG/ACT IN AERS: 2.0000 | INHALATION_SPRAY | Freq: Four times a day (QID) | RESPIRATORY_TRACT | 0 refills | Status: DC | PRN

## 2018-12-23 MED ORDER — LOSARTAN POTASSIUM 25 MG PO TABS: 25.0000 mg | ORAL_TABLET | Freq: Every day | ORAL | Status: DC

## 2018-12-23 NOTE — Telephone Encounter (Signed)
Left message to return call 

## 2018-12-23 NOTE — Telephone Encounter (Signed)
Spoke with daughter [name]. Klonopin called into [facility] on [address]. Daughter would like a virtual visit with provider tomorrow morning. Pt daughter transferred to front to have appt set up. Pt daughter verbalized understanding.

## 2018-12-23 NOTE — TOC Transition Note (Signed)
Transition of Care [facility]) - CM/SW Discharge Note   Patient Details  Name: [name] MRN: [id number] Date of Birth: 11-25-1940  [facility] CM/SW Contact:  Dessa Phi, RN Phone Number: 12/23/2018, 3:14 PM   Clinical Narrative:  Patient foe d/c with home health-[facility];home 02;bedside commode-[employer] to deliver to rm-dtr aware of 80/20% cost for bedside commode-dtr in agreement. No further CM needs.     Final next level of care: [place] Barriers to Discharge: Continued Medical Work up   Patient Goals and CMS Choice Patient states their goals for this hospitalization and ongoing recovery are:: go home CMS Medicare.gov Compare Post Acute Care list provided to:: Patient Represenative (must comment)(dtr Angie) Choice offered to / list presented to : Adult Children  Discharge Placement                       Discharge Plan and Services   Discharge Planning Services: CM Consult Post Acute Care Choice: Durable Medical Equipment          DME Arranged: Bedside commode DME Agency: [employer] Date DME Agency Contacted: 12/23/18 Time DME Agency Contacted: [phone] Representative spoke with at DME Agency: [place] Determinants of Health ([place]) Interventions     Readmission Risk Interventions No flowsheet data found.

## 2018-12-23 NOTE — Telephone Encounter (Signed)
Patient discharged from the hospital today and family states patient is very anxious and irritable and flies into rages over nothing- has been like this for days and it is bad. They are wondering if he could be prescribes something like klonopin or something to help him be calmer- currently living with daughter and son in law. Also wondering if zoloft needs to be adjusted- patient currently also on prednisone. Please advise- Daughter [name] said se can talk to you if you would like her number is in the message and she will have phone on(she is on DPR)

## 2018-12-23 NOTE — Progress Notes (Signed)
Occupational Therapy Treatment Patient Details Name: [name] MRN: [id number] DOB: 19-Jan-1941 Today's Date: 12/23/2018    History of present illness [name]  is a 78 y.o. male, w hypertension, hyperlipidemia, Glucose intolerance, CAD s/p stent, Diastolic CHF??? , Gerd, PUD, Ulcerative colitis, Copd presents with c/o dyspnea and cough w green sputum as well as wheezing.   OT comments  OT eval and education complete. Daughter will A with all ADL activity and provide 24/7 A  Follow Up Recommendations  No OT follow up          Precautions / Restrictions Precautions Precautions: Other (comment) Precaution Comments: monitor o2 sats Restrictions Weight Bearing Restrictions: No       Mobility Bed Mobility Overal bed mobility: Needs Assistance Bed Mobility: Sit to Supine       Sit to supine: Supervision      Transfers Overall transfer level: Needs assistance   Transfers: Sit to/from Stand Sit to Stand: Supervision         General transfer comment: uses momentum, but no physical assist given    Balance Overall balance assessment: Mild deficits observed, not formally tested;Needs assistance           Standing balance-Leahy Scale: Fair                             ADL either performed or assessed with clinical judgement   ADL Overall ADL's : Needs assistance/impaired                                       General ADL Comments: Pt overall S- min A with ADL activity. Daughter will A as needed. She is obtaining an elevated riser for pts toilet.               Cognition Arousal/Alertness: Awake/alert Behavior During Therapy: WFL for tasks assessed/performed Overall Cognitive Status: Within Functional Limits for tasks assessed                                                     Pertinent Vitals/ Pain       Pain Assessment: No/denies pain  Home Living Family/patient expects to be discharged  to:: Private residence Living Arrangements: Children Available Help at Discharge: Available 24 hours/day Type of Home: House Home Access: Stairs to enter [employer] of Steps: 1   Home Layout: One level     Bathroom Shower/Tub: Teacher, early years/pre: Standard(family planning on putting in handicapped height toilet)     Home Equipment: Environmental consultant - 4 wheels;Walker - 2 wheels   Additional Comments: lift chair      Prior Functioning/Environment Level of Independence: Independent        Comments: doesn't use rollator   Frequency           Progress Toward Goals  OT Goals(current goals can now be found in the care plan section)     Acute Rehab OT Goals Patient Stated Goal: home OT Goal Formulation: With patient Time For Goal Achievement: 12/23/18  Plan         AM-PAC OT "6 Clicks" Daily Activity     Outcome Measure   Help from another person eating  meals?: None Help from another person taking care of personal grooming?: None Help from another person toileting, which includes using toliet, bedpan, or urinal?: None Help from another person bathing (including washing, rinsing, drying)?: A Little Help from another person to put on and taking off regular upper body clothing?: None Help from another person to put on and taking off regular lower body clothing?: A Little 6 Click Score: 22    End of Session        Activity Tolerance Patient tolerated treatment well   Patient Left in chair;with call bell/phone within reach   Nurse Communication Mobility status        Time: 1561-5379 OT Time Calculation (min): 21 min  Charges: OT General Charges $OT Visit: 1 Visit OT Evaluation $OT Eval Low Complexity: 1 Low  Kari Baars, OT Acute Rehabilitation Services [phone] Office- [phone]      [place], Edwena Felty D 12/23/2018, 2:15 PM

## 2018-12-23 NOTE — Telephone Encounter (Signed)
Prednisone could be causing this Klonopin 0.5 mg 1/2 tablet or 1 full tablet twice daily as needed for agitation caution drowsiness use sparingly, #20  I am more than available to do a follow-up visit from his hospitalization but unfortunately the rest of today I am completely booked We could fit him in tomorrow or we could do a virtual visit or in person if they would rather on Thursday morning (I will be seeing patients then)

## 2018-12-23 NOTE — Telephone Encounter (Signed)
Patient daughter(Angie) has questions about patient medications . He is in the hospital. She would like you to call her please. [phone]

## 2018-12-23 NOTE — Discharge Planning (Addendum)
Physician Discharge Summary  DAXX TIGGS CVE:[id number] DOB: 05/07/1940 DOA: 12/21/2018  PCP: [name], MD  Admit date: 12/21/2018 Discharge date: 12/23/2018  Admitted From:home  Disposition: Home  Recommendations for Outpatient Follow-up:  1. Follow up with PCP in 1-2 weeks 2. Please obtain BMP/CBC in one week 3. Please follow up on the following pending results:  Home Health:no  Equipment/Devices: home o2 Discharge Condition -stable  CODE STATUS: full  Diet recommendation: Heart Healthy  Good healthy Brief/Interim Summary:   [name]  is a 78 y.o. male, w hypertension, hyperlipidemia, Glucose intolerance, CAD s/p stent, Diastolic CHF??? (per [facility] NP office note), Gerd, PUD, Ulcerative colitis, Copd presents with c/o dyspnea worse today. Cough w green sputum. Wheezing.  Pt denies fever, chills, cp, pap, n/v, diarrhea, brbpr.   In ED,  T 98.2, P 73  R 20, Bp 110/52  Pox 95% on 4L [place], 88% on [place]  Patient was admitted and treated for COPD with IV Solu-Medrol and Zithromax. He responded well to treatment.  However albuterol gave him mild transient tachycardia and anginal pain that was relieved with Nitropaste.  Patient mentions that this happens when he is on a lot of medications especially with steroids.  At home he usually tolerates albuterol very well.  On November 17, patient was felt to be at baseline.  He still requires about 4 L of oxygen via nasal cannula with seems to be his chronic oxygen requirement.  Case management consulted for home oxygen.  He does not have any other complaints.  So we will discharge him to follow-up with PCP and pulmonology.  He is also given referral for pulmonary rehab.     Discharge Diagnoses:  Principal Problem:   Acute respiratory failure with hypoxia ([name]) Active Problems:   COPD with acute exacerbation ([name])   CAD S/P percutaneous coronary angioplasty   Essential hypertension   Dyslipidemia, goal LDL below 70    Respiratory failure ([name])   Acute on chronic respiratory failure with hypoxia and hypercapnia ([name])   COPD with respiratory failure, acute ([name])  Chronic respiratory failure due to COPD Transient tachycardia and chest pain due to albuterol   Discharge Instructions  Discharge Instructions    AMB referral to pulmonary rehabilitation   Complete by: As directed    Please select a program: Pulmonary Rehabilitation (COPD Gold 2,3,4)   COPD Diagnosis: (See requirements below): COPD-Gold 3: Severe   30% </= FEV1 <50% predicted   Program Prescription:  O2 Administration by RT, EP, or RN if SpO2<88% Flutter Valve if indicated     After initial evaluation and assessments completed: Virtual Based Care may be provided alone or in conjunction with Pulmonary Rehab/Respiratory Care services based on patient barriers.: Yes   Diet - low sodium heart healthy   Complete by: As directed    Discharge instructions   Complete by: As directed    Increase activity slowly   Complete by: As directed      Allergies as of 12/23/2018   No Known Allergies     Medication List    TAKE these medications   acetaminophen 325 MG tablet Commonly known as: TYLENOL Take 650 mg by mouth every 6 (six) hours as needed for moderate pain or headache.   albuterol 108 (90 Base) MCG/ACT inhaler Commonly known as: VENTOLIN HFA Inhale 2 puffs into the lungs every 6 (six) hours as needed for wheezing or shortness of breath.   amLODipine 2.5 MG tablet Commonly known as: NORVASC Take  1 tablet (2.5 mg total) by mouth every morning.   aspirin 81 MG tablet Take 1 tablet (81 mg total) by mouth at bedtime.   balsalazide 750 MG capsule Commonly known as: Colazal Take 3 capsules (2,250 mg total) by mouth 3 (three) times daily.   doxazosin 4 MG tablet Commonly known as: CARDURA Take one tablet by mouth at bedtime What changed:   how much to take  how to take this  when to take this  additional instructions    furosemide 40 MG tablet Commonly known as: LASIX Take 1 tablet (40 mg total) by mouth every morning.   gabapentin 300 MG capsule Commonly known as: NEURONTIN Take 300 mg by mouth 2 (two) times daily.   loperamide 2 MG capsule Commonly known as: IMODIUM Take 2 mg by mouth daily.   losartan 25 MG tablet Commonly known as: COZAAR Take 1 tablet (25 mg total) by mouth daily.   metoprolol tartrate 50 MG tablet Commonly known as: LOPRESSOR One po QHS What changed:   how much to take  how to take this  when to take this  additional instructions   mupirocin ointment 2 % Commonly known as: Bactroban Apply thin layer to affected area once a day   nitroGLYCERIN 0.4 MG SL tablet Commonly known as: NITROSTAT Place 0.4 mg under the tongue every 5 (five) minutes as needed for chest pain.   pravastatin 40 MG tablet Commonly known as: PRAVACHOL Take 1 tablet (40 mg total) by mouth at bedtime.   predniSONE 20 MG tablet Commonly known as: DELTASONE 2 tabs for 3 days, then 1 tab for 3 days and then stop   sertraline 50 MG tablet Commonly known as: ZOLOFT TAKE 1 TABLET BY MOUTH ONCE DAILY -  NEEDS  VIRTUAL  VISIT What changed: See the new instructions.   Spiriva HandiHaler 18 MCG inhalation capsule Generic drug: tiotropium One inhalation po daily   triamcinolone cream 0.1 % Commonly known as: KENALOG Apply twice daily as needed for psoriasis.            Durable Medical Equipment  (From admission, onward)         Start     Ordered   12/22/18 1214  For home use only DME oxygen  Once    Question Answer Comment  Length of Need Lifetime   Mode or (Route) Nasal cannula   Liters per Minute 4   Frequency Continuous (stationary and portable oxygen unit needed)   Oxygen conserving device No   Oxygen delivery system Gas      12/22/18 1214         Follow-up Information    [facility] Follow up.   Why: home oxygen Contact information: [address] [place] [address] [phone]          No Known Allergies  Consultations:  Specify Physician/Group   Procedures/Studies: Ct Angio Chest Pe W Or Wo Contrast  Result Date: 12/22/2018 CLINICAL DATA:  Dyspnea, cough, wheezing, hypoxia and respiratory failure. EXAM: CT ANGIOGRAPHY CHEST WITH CONTRAST TECHNIQUE: Multidetector CT imaging of the chest was performed using the standard protocol during bolus administration of intravenous contrast. Multiplanar CT image reconstructions and MIPs were obtained to evaluate the vascular anatomy. CONTRAST:  160m OMNIPAQUE IOHEXOL 350 MG/ML SOLN COMPARISON:  03/15/2018 FINDINGS: Cardiovascular: Opacification of the pulmonary arteries on the current study is rather suboptimal beyond the level of lobar central arteries. No large central pulmonary embolism identified. Stable cardiac enlargement and  coronary atherosclerosis with extensive calcified plaque present in a 3 vessel distribution. No pericardial fluid identified. The thoracic aorta is normal in caliber. Mediastinum/Nodes: No enlarged mediastinal, hilar, or axillary lymph nodes. Thyroid gland, trachea, and esophagus demonstrate no significant findings. Lungs/Pleura: Stable emphysematous lung disease. Stable pulmonary venous hypertension. No overt airspace consolidation or pulmonary edema. No pneumothorax or pleural fluid. Upper Abdomen: No acute abnormality. Musculoskeletal: No chest wall abnormality. No acute or significant osseous findings. Review of the MIP images confirms the above findings. IMPRESSION: 1. Suboptimal opacification of the pulmonary arteries on the current study beyond the level of lobar central arteries. No large central pulmonary embolism identified. 2. Stable cardiac enlargement and coronary atherosclerosis with extensive calcified plaque in a 3 vessel distribution. 3. Stable emphysematous lung disease and pulmonary venous hypertension. Emphysema (ICD10-J43.9). Electronically  Signed   By: Aletta Edouard M.D.   On: 12/22/2018 18:18   Dg Chest Port 1 View  Result Date: 12/21/2018 CLINICAL DATA:  Dyspnea EXAM: PORTABLE CHEST 1 VIEW COMPARISON:  03/15/2018 FINDINGS: Cardiomegaly. Emphysema. The visualized skeletal structures are unremarkable. IMPRESSION: 1. Emphysema without acute abnormality of the lungs in AP portable projection 2.  Cardiomegaly. Electronically Signed   By: Eddie Candle M.D.   On: 12/21/2018 19:38       Subjective:   Discharge Exam: Vitals:   12/23/18 0531 12/23/18 0819  BP: (!) 151/79   Pulse: (!) 52   Resp: 18   Temp: 97.8 F (36.6 C)   SpO2: 94% 93%   Vitals:   12/22/18 2047 12/23/18 0458 12/23/18 0531 12/23/18 0819  BP: (!) 116/98  (!) 151/79   Pulse: 66  (!) 52   Resp: 18  18   Temp: 97.7 F (36.5 C)  97.8 F (36.6 C)   TempSrc:      SpO2: 95%  94% 93%  Weight:  121.3 kg    Height:        General: Pt is alert, awake, not in acute distress Cardiovascular: RRR, S1/S2 +, no rubs, no gallops Respiratory: mild  wheezing, no rhonchi Abdominal: Soft, NT, ND, bowel sounds + Extremities: no edema, no cyanosis    The results of significant diagnostics from this hospitalization (including imaging, microbiology, ancillary and laboratory) are listed below for reference.     Microbiology: Recent Results (from the past 240 hour(s))  SARS CORONAVIRUS 2 (TAT 6-24 HRS) Nasopharyngeal Nasopharyngeal Swab     Status: None   Collection Time: 12/21/18  6:39 PM   Specimen: Nasopharyngeal Swab  Result Value Ref Range Status   SARS Coronavirus 2 NEGATIVE NEGATIVE Final    Comment: (NOTE) SARS-CoV-2 target nucleic acids are NOT DETECTED. The SARS-CoV-2 RNA is generally detectable in upper and lower respiratory specimens during the acute phase of infection. Negative results do not preclude SARS-CoV-2 infection, do not rule out co-infections with other pathogens, and should not be used as the sole basis for treatment or other patient  management decisions. Negative results must be combined with clinical observations, patient history, and epidemiological information. The expected result is Negative. Fact Sheet for Patients: SugarRoll.be Fact Sheet for Healthcare Providers: https://www.woods-mathews.com/ This test is not yet approved or cleared by the [place] FDA and  has been authorized for detection and/or diagnosis of SARS-CoV-2 by FDA under an Emergency Use Authorization (EUA). This EUA will remain  in effect (meaning this test can be used) for the duration of the COVID-19 declaration under Section 56 4(b)(1) of the Act, 21 U.S.C. section 360bbb-3(b)(1), unless  the authorization is terminated or revoked sooner. Performed at [facility], [place] [address], [place], [place] [address]      Labs: BNP (last 3 results) Recent Labs    03/15/18 0055 11/07/18 1140 12/21/18 1853  BNP 76.2 93.1 05.3   Basic Metabolic Panel: Recent Labs  Lab 12/21/18 1851 12/22/18 0419 12/23/18 0601  NA 139 140 137  K 4.2 4.5 4.5  CL 107 107 106  CO2 26 25 25   GLUCOSE 95 203* 178*  BUN 22 22 29*  CREATININE 1.27* 1.15 1.09  CALCIUM 8.8* 8.9 9.1   Liver Function Tests: Recent Labs  Lab 12/21/18 1851 12/22/18 0419  AST 17 17  ALT 14 14  ALKPHOS 64 62  BILITOT 0.9 0.5  PROT 7.1 7.0  ALBUMIN 3.6 3.2*   No results for input(s): LIPASE, AMYLASE in the last 168 hours. No results for input(s): AMMONIA in the last 168 hours. CBC: Recent Labs  Lab 12/21/18 1851 12/22/18 0419 12/23/18 0601  WBC 13.0* 9.0 12.9*  NEUTROABS 8.5*  --  10.7*  HGB 13.4 13.1 12.7*  HCT 43.1 42.2 40.1  MCV 94.3 95.0 93.7  PLT 224 196 208   Cardiac Enzymes: No results for input(s): CKTOTAL, CKMB, CKMBINDEX, TROPONINI in the last 168 hours. BNP: Invalid input(s): POCBNP CBG: Recent Labs  Lab 12/22/18 1649 12/22/18 1949 12/23/18 0802  GLUCAP 245* 237* 158*   D-Dimer Recent Labs     12/22/18 1543  DDIMER 0.88*   Hgb A1c Recent Labs    12/22/18 0419  HGBA1C 5.9*   Lipid Profile No results for input(s): CHOL, HDL, LDLCALC, TRIG, CHOLHDL, LDLDIRECT in the last 72 hours. Thyroid function studies No results for input(s): TSH, T4TOTAL, T3FREE, THYROIDAB in the last 72 hours.  Invalid input(s): FREET3 Anemia work up No results for input(s): VITAMINB12, FOLATE, FERRITIN, TIBC, IRON, RETICCTPCT in the last 72 hours. Urinalysis    Component Value Date/Time   COLORURINE YELLOW 08/04/2014 1620   APPEARANCEUR CLEAR 08/04/2014 1620   LABSPEC 1.020 08/04/2014 1620   PHURINE 6.0 08/04/2014 1620   GLUCOSEU NEGATIVE 08/04/2014 1620   HGBUR SMALL (A) 08/04/2014 1620   BILIRUBINUR NEGATIVE 08/04/2014 1620   BILIRUBINUR ++ 11/18/2012 1319   KETONESUR NEGATIVE 08/04/2014 1620   PROTEINUR NEGATIVE 08/04/2014 1620   UROBILINOGEN 0.2 08/04/2014 1620   NITRITE NEGATIVE 08/04/2014 1620   LEUKOCYTESUR NEGATIVE 08/04/2014 1620   Sepsis Labs Invalid input(s): PROCALCITONIN,  WBC,  LACTICIDVEN Microbiology Recent Results (from the past 240 hour(s))  SARS CORONAVIRUS 2 (TAT 6-24 HRS) Nasopharyngeal Nasopharyngeal Swab     Status: None   Collection Time: 12/21/18  6:39 PM   Specimen: Nasopharyngeal Swab  Result Value Ref Range Status   SARS Coronavirus 2 NEGATIVE NEGATIVE Final    Comment: (NOTE) SARS-CoV-2 target nucleic acids are NOT DETECTED. The SARS-CoV-2 RNA is generally detectable in upper and lower respiratory specimens during the acute phase of infection. Negative results do not preclude SARS-CoV-2 infection, do not rule out co-infections with other pathogens, and should not be used as the sole basis for treatment or other patient management decisions. Negative results must be combined with clinical observations, patient history, and epidemiological information. The expected result is Negative. Fact Sheet for  Patients: SugarRoll.be Fact Sheet for Healthcare Providers: https://www.woods-mathews.com/ This test is not yet approved or cleared by the [place] FDA and  has been authorized for detection and/or diagnosis of SARS-CoV-2 by FDA under an Emergency Use Authorization (EUA). This EUA will  remain  in effect (meaning this test can be used) for the duration of the COVID-19 declaration under Section 56 4(b)(1) of the Act, 21 U.S.C. section 360bbb-3(b)(1), unless the authorization is terminated or revoked sooner. Performed at [facility], [place] [address] [place], [place] [address]      Time coordinating discharge: Over 30 minutes  SIGNED:   [name], MD  [facility] 12/23/2018, 11:19 AM Pager   If 7PM-7AM, please contact night-coverage [email or web address] Password [email or web address]

## 2018-12-24 ENCOUNTER — Other Ambulatory Visit: Payer: Self-pay

## 2018-12-24 ENCOUNTER — Telehealth: Payer: Self-pay | Admitting: Family Medicine

## 2018-12-24 ENCOUNTER — Ambulatory Visit (INDEPENDENT_AMBULATORY_CARE_PROVIDER_SITE_OTHER): Payer: Medicare Other | Admitting: Family Medicine

## 2018-12-24 ENCOUNTER — Other Ambulatory Visit: Payer: Self-pay | Admitting: *Deleted

## 2018-12-24 DIAGNOSIS — J439 Emphysema, unspecified: Secondary | ICD-10-CM | POA: Diagnosis not present

## 2018-12-24 DIAGNOSIS — I5030 Unspecified diastolic (congestive) heart failure: Secondary | ICD-10-CM | POA: Diagnosis not present

## 2018-12-24 DIAGNOSIS — R0600 Dyspnea, unspecified: Secondary | ICD-10-CM

## 2018-12-24 DIAGNOSIS — R0609 Other forms of dyspnea: Secondary | ICD-10-CM

## 2018-12-24 DIAGNOSIS — J449 Chronic obstructive pulmonary disease, unspecified: Secondary | ICD-10-CM

## 2018-12-24 DIAGNOSIS — I2510 Atherosclerotic heart disease of native coronary artery without angina pectoris: Secondary | ICD-10-CM | POA: Diagnosis not present

## 2018-12-24 DIAGNOSIS — I110 Hypertensive heart disease with heart failure: Secondary | ICD-10-CM | POA: Diagnosis not present

## 2018-12-24 DIAGNOSIS — J432 Centrilobular emphysema: Secondary | ICD-10-CM

## 2018-12-24 DIAGNOSIS — H35359 Cystoid macular degeneration, unspecified eye: Secondary | ICD-10-CM | POA: Diagnosis not present

## 2018-12-24 DIAGNOSIS — J9601 Acute respiratory failure with hypoxia: Secondary | ICD-10-CM | POA: Diagnosis not present

## 2018-12-24 MED ORDER — SERTRALINE HCL 100 MG PO TABS: ORAL_TABLET | ORAL | 5 refills | Status: DC

## 2018-12-24 NOTE — Progress Notes (Addendum)
Subjective:    Patient ID: [name], male    DOB: December 28, 1940, 78 y.o.   MRN: [id number]  HPIhospitalization follow up.   Daughter [name] had some concerns about him getting worked up over nothing. States he has been hateful and wants to get him something for his nerves. Klonopin was sent in yesterday but has not picked up. Daughter also states his sertraline was suppose to be increased and it was not. He is taking 45m daily now.  Reviewed over all the ER records hospital records labs etc. with the patient and their family.  Current Outpatient Medications on File Prior to Visit  Medication Sig Dispense Refill  . acetaminophen (TYLENOL) 325 MG tablet Take 650 mg by mouth every 6 (six) hours as needed for moderate pain or headache.    . albuterol (VENTOLIN HFA) 108 (90 Base) MCG/ACT inhaler Inhale 2 puffs into the lungs every 6 (six) hours as needed for wheezing or shortness of breath. 18 g 0  . amLODipine (NORVASC) 2.5 MG tablet Take 1 tablet (2.5 mg total) by mouth every morning. 30 tablet 5  . aspirin 81 MG tablet Take 1 tablet (81 mg total) by mouth at bedtime. 30 tablet   . balsalazide (COLAZAL) 750 MG capsule Take 3 capsules (2,250 mg total) by mouth 3 (three) times daily. 270 capsule 3  . clonazePAM (KLONOPIN) 0.5 MG tablet Take 1/2-1 tablet po BID PRN agitation. Caution:drowsiness; use sparingly 20 tablet 0  . doxazosin (CARDURA) 4 MG tablet Take one tablet by mouth at bedtime (Patient taking differently: Take 4 mg by mouth at bedtime. ) 30 tablet 5  . furosemide (LASIX) 40 MG tablet Take 1 tablet (40 mg total) by mouth every morning.    . gabapentin (NEURONTIN) 300 MG capsule Take 300 mg by mouth 2 (two) times daily.    .Marland Kitchenloperamide (IMODIUM) 2 MG capsule Take 2 mg by mouth daily.    .Marland Kitchenlosartan (COZAAR) 25 MG tablet Take 1 tablet (25 mg total) by mouth daily.    . metoprolol tartrate (LOPRESSOR) 50 MG tablet One po QHS (Patient taking differently: Take 50 mg by mouth at  bedtime. ) 30 tablet 5  . mupirocin ointment (BACTROBAN) 2 % Apply thin layer to affected area once a day 22 g 0  . nitroGLYCERIN (NITROSTAT) 0.4 MG SL tablet Place 0.4 mg under the tongue every 5 (five) minutes as needed for chest pain.    . pravastatin (PRAVACHOL) 40 MG tablet Take 1 tablet (40 mg total) by mouth at bedtime. 90 tablet 3  . predniSONE (DELTASONE) 20 MG tablet 2 tabs for 3 days, then 1 tab for 3 days and then stop 9 tablet 0  . sertraline (ZOLOFT) 50 MG tablet TAKE 1 TABLET BY MOUTH ONCE DAILY -  NEEDS  VIRTUAL  VISIT (Patient taking differently: Take 50 mg by mouth daily. ) 30 tablet 5  . tiotropium (SPIRIVA HANDIHALER) 18 MCG inhalation capsule One inhalation po daily 30 capsule 5  . triamcinolone cream (KENALOG) 0.1 % Apply twice daily as needed for psoriasis. 45 g 0   No current facility-administered medications on file prior to visit.     Virtual Visit via Video Note  I connected with CHebert Sohoon 12/24/18 at 10:00 AM EST by a video enabled telemedicine application and verified that I am speaking with the correct person using two identifiers.  Location: Patient: home Provider: office   I discussed the limitations of evaluation and management by  telemedicine and the availability of in person appointments. The patient expressed understanding and agreed to proceed.  History of Present Illness:    Observations/Objective:   Assessment and Plan:   Follow Up Instructions:    I discussed the assessment and treatment plan with the patient. The patient was provided an opportunity to ask questions and all were answered. The patient agreed with the plan and demonstrated an understanding of the instructions.   The patient was advised to call back or seek an in-person evaluation if the symptoms worsen or if the condition fails to improve as anticipated.  I provided 30 minutes of non-face-to-face time during this encounter.       Review of Systems   Constitutional: Negative for diaphoresis and fatigue.  HENT: Negative for congestion and rhinorrhea.   Respiratory: Positive for cough, shortness of breath and wheezing.   Cardiovascular: Negative for chest pain and leg swelling.  Gastrointestinal: Negative for abdominal pain and diarrhea.  Skin: Negative for color change and rash.  Neurological: Negative for dizziness and headaches.  Psychiatric/Behavioral: Negative for behavioral problems and confusion.       Objective:   Physical Exam  Patient had virtual visit Appears to be in no distress Atraumatic Neuro able to relate and oriented No apparent resp distress Color normal       Assessment & Plan:  Hospital transitional care We did review over the medications We are adding a dual agent twice daily to help with the breathing Referral to pulmonary for further evaluation of COPD emphysema patient in the past unable to do his PFTs Some end-of-life issues will need to be addressed.  Patient is starting to get to the point where he is he is having progressive flareups and illnesses which do not bode well for the future 30 minutes spent with patient  Because of patient's significant DOE as well as COPD patient does require 24-hour oxygen.  He wears 4 L per nasal cannula on a regular basis.  It is medically necessary

## 2018-12-24 NOTE — Discharge Summary (Signed)
Chris Dunning, MD  Physician  Specialty:  Internal Medicine     Discharge Planning  Addendum     Date of Service:  12/23/2018 11:19 AM               Expand All Collapse All            Expand widget buttonCollapse widget button    Show:Clear all   ManualTemplateCopied  Added by:     Chris Dunning, MD   Hover for detailscustomization button                                                                Physician Discharge Summary    [name]:[id number] DOB: [birth year] DOA: 12/21/2018     PCP: [name], MD     Admit date: 12/21/2018  Discharge date: 12/23/2018     Admitted From:home              Disposition: Home     Recommendations for Outpatient Follow-up:   1.Follow up with PCP in 1-2 weeks   2.Please obtain BMP/CBC in one week   3.Please follow up on the following pending results:      Home Health:no   Equipment/Devices: home o2 Discharge Condition -stable   CODE STATUS: full   Diet recommendation: Heart Healthy   Good healthy  Brief/Interim Summary:    [name]  is a 78 y.o. male, w hypertension, hyperlipidemia, Glucose intolerance, CAD s/p stent, Diastolic CHF??? (per [facility] NP office note), Gerd, PUD, Ulcerative colitis, Copd presents with c/o dyspnea worse today. Cough w green sputum. Wheezing.  Pt denies fever, chills, cp, pap, n/v, diarrhea, brbpr.      In ED,   T 98.2, P 73  R 20, Bp 110/52  Pox 95% on 4L [place], 88% on [place]     Patient was admitted and treated for COPD with IV Solu-Medrol and Zithromax.  He responded well to treatment.  However albuterol gave him mild transient tachycardia and anginal pain that was relieved with Nitropaste.  Patient mentions that this happens when he is on a lot of medications especially with steroids.  At home he usually tolerates albuterol very  well.     On November 17, patient was felt to be at baseline.  He still requires about 4 L of oxygen via nasal cannula with seems to be his chronic oxygen requirement.  Case management consulted for home oxygen.  He does not have any other complaints.  So we will discharge him to follow-up with PCP and pulmonology.  He is also given referral for pulmonary rehab.           Discharge Diagnoses:   Principal Problem:    Acute respiratory failure with hypoxia ([name])  Active Problems:    COPD with acute exacerbation ([name])    CAD S/P percutaneous coronary angioplasty    Essential hypertension    Dyslipidemia, goal LDL below 70    Respiratory failure ([name])    Acute on chronic respiratory failure with hypoxia and hypercapnia ([name])    COPD with respiratory failure, acute ([name])     Chronic respiratory failure  due to COPD  Transient tachycardia and chest pain due to albuterol        Discharge Instructions          Discharge Instructions          AMB referral to pulmonary rehabilitation     Complete by: As directed           Please select a program: Pulmonary Rehabilitation (COPD Gold 2,3,4)        COPD Diagnosis: (See requirements below): COPD-Gold 3: Severe   30% </= FEV1 <50% predicted          Program Prescription:    O2 Administration by RT, EP, or RN if SpO2<88%  Flutter Valve if indicated           After initial evaluation and assessments completed: Virtual Based Care may be provided alone or in conjunction with Pulmonary Rehab/Respiratory Care services based on patient barriers.: Yes        Diet - low sodium heart healthy     Complete by: As directed           Discharge instructions     Complete by: As directed           Increase activity slowly     Complete by: As directed                 Allergies as of 12/23/2018     No Known Allergies                       Medication List               TAKE these medications          acetaminophen 325 MG tablet  Commonly known as: TYLENOL  Take 650 mg by mouth every 6 (six) hours as needed for moderate pain or headache.       albuterol 108 (90 Base) MCG/ACT inhaler  Commonly known as: VENTOLIN HFA  Inhale 2 puffs into the lungs every 6 (six) hours as needed for wheezing or shortness of breath.       amLODipine 2.5 MG tablet  Commonly known as: NORVASC  Take 1 tablet (2.5 mg total) by mouth every morning.       aspirin 81 MG tablet  Take 1 tablet (81 mg total) by mouth at bedtime.       balsalazide 750 MG capsule  Commonly known as: Colazal  Take 3 capsules (2,250 mg total) by mouth 3 (three) times daily.       doxazosin 4 MG tablet  Commonly known as: CARDURA  Take one tablet by mouth at bedtime  What changed:   .how much to take   .how to take this   .when to take this   .additional instructions        furosemide 40 MG tablet  Commonly known as: LASIX  Take 1 tablet (40 mg total) by mouth every morning.       gabapentin 300 MG capsule  Commonly known as: NEURONTIN  Take 300 mg by mouth 2 (two) times daily.       loperamide 2 MG capsule  Commonly known as: IMODIUM  Take 2 mg by mouth daily.       losartan 25 MG tablet  Commonly known as: COZAAR  Take 1 tablet (25 mg total) by mouth daily.       metoprolol tartrate 50 MG tablet  Commonly known as: LOPRESSOR  One po QHS  What changed:   .how much to take   .how to take this   .when to take this   .additional instructions        mupirocin ointment 2 %  Commonly known as: Bactroban  Apply thin layer to affected area once a day       nitroGLYCERIN 0.4 MG SL tablet  Commonly known as: NITROSTAT  Place 0.4 mg under the tongue every 5 (five) minutes as needed for chest pain.       pravastatin 40 MG tablet  Commonly known as: PRAVACHOL  Take 1 tablet (40 mg  total) by mouth at bedtime.       predniSONE 20 MG tablet  Commonly known as: DELTASONE  2 tabs for 3 days, then 1 tab for 3 days and then stop       sertraline 50 MG tablet  Commonly known as: ZOLOFT  TAKE 1 TABLET BY MOUTH ONCE DAILY -  NEEDS  VIRTUAL  VISIT  What changed: See the new instructions.       Spiriva HandiHaler 18 MCG inhalation capsule  Generic drug: tiotropium  One inhalation po daily       triamcinolone cream 0.1 %  Commonly known as: KENALOG  Apply twice daily as needed for psoriasis.                                              Durable Medical Equipment    (From admission, onward)                                          Start           Ordered        12/22/18 1214        For home use only DME oxygen  Once        Question   Answer   Comment    Length of Need   Lifetime        Mode or (Route)   Nasal cannula        Liters per Minute   4        Frequency   Continuous (stationary and portable oxygen unit needed)        Oxygen conserving device   No        Oxygen delivery system   Gas             12/22/18 1214                           Follow-up Information            [facility] Follow up.     Why: home oxygen  Contact information:  [place] [address]  [phone]                           No Known Allergies     Consultations:  .Specify Physician/Group         Procedures/Studies:  Imaging Results                         .  Subjective:        Discharge Exam:       Vitals:        12/23/18 0531   12/23/18 0819    BP:   (!) 151/79        Pulse:   (!) 52        Resp:   18        Temp:   97.8 F (36.6 C)        SpO2:   94%   93%               Vitals:        12/22/18 2047   12/23/18 0458   12/23/18 0531   12/23/18 0819    BP:   (!) 116/98       (!) 151/79        Pulse:   66       (!) 52        Resp:   18       18        Temp:   97.7 F (36.5 C)       97.8 F (36.6 C)        TempSrc:                    SpO2:   95%       94%   93%    Weight:       121.3 kg            Height:                          General: Pt is alert, awake, not in acute distress  Cardiovascular: RRR, S1/S2 +, no rubs, no gallops  Respiratory: mild  wheezing, no rhonchi  Abdominal: Soft, NT, ND, bowel sounds +  Extremities: no edema, no cyanosis               The results of significant diagnostics from this hospitalization (including imaging, microbiology, ancillary and laboratory) are listed below for reference.           Microbiology:          Recent Results (from the past 240 hour(s))    SARS CORONAVIRUS 2 (TAT 6-24 HRS) Nasopharyngeal Nasopharyngeal Swab     Status: None        Collection Time: 12/21/18  6:39 PM        Specimen: Nasopharyngeal Swab    Result   Value   Ref Range   Status        SARS Coronavirus 2   NEGATIVE   NEGATIVE   Final            Comment:   (NOTE)  SARS-CoV-2 target nucleic acids are NOT DETECTED.  The SARS-CoV-2 RNA is generally detectable in upper and lower  respiratory specimens during the acute phase of infection. Negative  results do not preclude SARS-CoV-2 infection, do not rule out  co-infections with other pathogens, and should not be used as the  sole basis for treatment or other patient management decisions.  Negative results must be combined with clinical observations,  patient history, and epidemiological information. The expected  result is Negative.  Fact Sheet for Patients:  SugarRoll.be  Fact Sheet for Healthcare  Providers:  https://www.woods-mathews.com/  This test is not yet approved or cleared by the [place] FDA and   has been authorized for  detection and/or diagnosis of SARS-CoV-2 by  FDA under an Emergency Use Authorization (EUA). This EUA will remain   in effect (meaning this test can be used) for the duration of the  COVID-19 declaration under Section 56  4(b)(1) of the Act, 21 U.S.C.  section 360bbb-3(b)(1), unless the authorization is terminated or  revoked sooner.  Performed at [facility], [place] [address] [place], [place]  [address]             Labs:  BNP (last 3 results)  Recent Labs (within last 365 days)                                                  Basic Metabolic Panel:  Last Labs                                                                                                                                              Liver Function Tests:  Last Labs                                                                                             Last Labs         Last Labs        CBC:  Last Labs                                                                                                                    Cardiac Enzymes:   Last Labs        BNP:   Last Labs        CBG:  Last Labs  D-Dimer  Recent Labs (last 2 labs)                                 Hgb A1c  Recent Labs (last 2 labs)                                 Lipid Profile   Recent Labs (last 2 labs)        Thyroid function studies    Recent Labs  (last 2 labs)             Anemia work up   [employer] (last 2 labs)        Urinalysis  Labs (Brief)                                                                                                                                                                                                     Sepsis Labs   Last Labs        Microbiology          Recent Results (from the past 240 hour(s))    SARS CORONAVIRUS 2 (TAT 6-24 HRS) Nasopharyngeal Nasopharyngeal Swab     Status: None        Collection Time: 12/21/18  6:39 PM        Specimen: Nasopharyngeal Swab    Result   Value   Ref Range   Status        SARS Coronavirus 2   NEGATIVE   NEGATIVE   Final            Comment:   (NOTE)  SARS-CoV-2 target nucleic acids are NOT DETECTED.  The SARS-CoV-2 RNA is generally detectable in upper and lower  respiratory specimens during the acute phase of infection. Negative  results do not preclude SARS-CoV-2 infection, do not rule out  co-infections with other pathogens, and should not be used as the  sole basis for treatment or other patient management decisions.  Negative results must be combined with clinical observations,  patient history, and epidemiological information. The expected  result is Negative.  Fact Sheet for Patients:  SugarRoll.be  Fact Sheet for Healthcare Providers:  https://www.woods-mathews.com/  This test is not yet approved or cleared by the [place] FDA and   has been authorized for detection and/or diagnosis of SARS-CoV-2 by  FDA under an Emergency Use Authorization (EUA). This EUA will remain   in effect (meaning this test can  be used) for the duration of the  COVID-19 declaration under Section 56  4(b)(1) of the Act, 21  U.S.C.  section 360bbb-3(b)(1), unless the authorization is terminated or  revoked sooner.  Performed at [facility], [place] [address] [place], [place]  [address]                Time coordinating discharge: Over 30 minutes     SIGNED:        [name], MD     [facility]  12/23/2018, 11:19 AM  Pager      If 7PM-7AM, please contact night-coverage  [email or web address]  Password [email or web address]           Revision History

## 2018-12-24 NOTE — Telephone Encounter (Signed)
Carita Pian w/[facility] calling to request a verbal order to continue physical therapy once a week for 5 weeks  Please advise & call [phone], [name] w/[facility]

## 2018-12-24 NOTE — Telephone Encounter (Signed)
Contacted [name] with [name] and gave verbal orders to continue physical therapy once a week for 5 weeks. [name] verbalized understanding.

## 2018-12-24 NOTE — Telephone Encounter (Signed)
Please do so thank you

## 2018-12-29 ENCOUNTER — Telehealth: Payer: Self-pay | Admitting: Family Medicine

## 2018-12-29 DIAGNOSIS — J9601 Acute respiratory failure with hypoxia: Secondary | ICD-10-CM | POA: Diagnosis not present

## 2018-12-29 DIAGNOSIS — I5030 Unspecified diastolic (congestive) heart failure: Secondary | ICD-10-CM | POA: Diagnosis not present

## 2018-12-29 DIAGNOSIS — J439 Emphysema, unspecified: Secondary | ICD-10-CM | POA: Diagnosis not present

## 2018-12-29 DIAGNOSIS — I2510 Atherosclerotic heart disease of native coronary artery without angina pectoris: Secondary | ICD-10-CM | POA: Diagnosis not present

## 2018-12-29 DIAGNOSIS — I110 Hypertensive heart disease with heart failure: Secondary | ICD-10-CM | POA: Diagnosis not present

## 2018-12-29 NOTE — Telephone Encounter (Signed)
Bahavin with [name] calling requesting verbal order for home health aid to assist with showers and baths.   CB# [phone]

## 2018-12-29 NOTE — Telephone Encounter (Signed)
Contacted Bahavin and gave verbal orders for home health aide to assist with showers and baths.

## 2018-12-29 NOTE — Telephone Encounter (Signed)
Please advise. Thank you

## 2018-12-29 NOTE — Telephone Encounter (Signed)
Please go ahead with verbal orders thank you

## 2018-12-30 ENCOUNTER — Ambulatory Visit (INDEPENDENT_AMBULATORY_CARE_PROVIDER_SITE_OTHER): Payer: Medicare Other | Admitting: Otolaryngology

## 2018-12-30 ENCOUNTER — Other Ambulatory Visit: Payer: Self-pay

## 2018-12-30 ENCOUNTER — Encounter (INDEPENDENT_AMBULATORY_CARE_PROVIDER_SITE_OTHER): Payer: Self-pay | Admitting: Otolaryngology

## 2018-12-30 VITALS — Temp 97.2°F

## 2018-12-30 DIAGNOSIS — H608X3 Other otitis externa, bilateral: Secondary | ICD-10-CM

## 2018-12-30 DIAGNOSIS — H6123 Impacted cerumen, bilateral: Secondary | ICD-10-CM | POA: Diagnosis not present

## 2018-12-30 NOTE — Progress Notes (Signed)
HPI: [name] is a 78 y.o. male who presents to get his ears cleaned. Patient wears bilateral hearing aids and he has noticed his hearing aids have stopped up. He also complains of itching in bilateral ears; this has been ongoing for a while. He denies any ear pain or ear drainage. No further complaints today.  Past Medical History:  Diagnosis Date  . Ankylosing spondylitis ([place]) 11/07/2018  . Asthma   . BPH (benign prostatic hyperplasia)   . CAD (coronary artery disease) 01/1995  . Clostridium difficile colitis 04/2005  . Colitis 2011  . COPD (chronic obstructive pulmonary disease) ([place])   . Depression   . Diverticulosis   . DJD (degenerative joint disease)   . Gastric ulcer 04/17/10   Three 86m gastric ulcers, H.pylori serologies were negative  . GERD (gastroesophageal reflux disease)   . History of kidney stones   . Hyperlipidemia   . Hypertension   . Idiopathic chronic inflammatory bowel disease 05/18/2010   left-sided UC  . Kidney stone   . Morbid obesity ([place] 03/12/2018  . Obstructive sleep apnea    on Cpap  . Reflux 02/1995  . S/P endoscopy 07/24/10   retained gastric contents, benign bx   Past Surgical History:  Procedure Laterality Date  . ANORECTAL MANOMETRY  2016   [facility]: concern for possible fissure. Noted pelvic floor dyssnyergy  . BIOPSY  07/29/2017   Procedure: BIOPSY;  Surgeon: RDaneil Dolin MD;  Location: [facility] END[facility] SU[facility];  Service: Endoscopy;;  ascending and sigmoid colon  . CARDIAC CATHETERIZATION     with stent  . CARDIOVASCULAR STRESS TEST  07/21/2009   No scintigraphic evidence of inducible myocardial ischemia  . CARPAL TUNNEL RELEASE Left 01/17/2017   Procedure: LEFT CARPAL TUNNEL RELEASE;  Surgeon: HCarole Civil MD;  Location: [facility] ORS;  Service: Orthopedics;  Laterality: Left;  . CATARACT EXTRACTION, BILATERAL Bilateral   . CERVICAL SPINE SURGERY     C4-5  . COLONOSCOPY  04/2005   granularity and friability erosions from rectum to 40cm.  Bx infection vs IBD. C. Diff positive at the time.   . COLONOSCOPY  05/2010   [facility]: left-sided UC, bx with no dysplasia, shallow diverticula  . COLONOSCOPY [name] 11/07/2012   RAOZ:HYQM-VHQIOproctocolitis status post segmental biopsy/Sigmoid colon polyps removed as described above. Procedure compromisd by technical difficulties. bx: Inflammation limited to sigmoid and rectum on pathology.  . COLONOSCOPY  04/2014   [facility] well localized proctocolitis limited to sigmoid  . COLONOSCOPY WITH PROPOFOL [name] 07/29/2017   diverticulosis in colon, three 4-6 mm polyps at splenic flexure and in cecum, one 10 mm polyp in rectum, abnormal rectum and sigmoid consistent with active UC  . CORONARY STENT PLACEMENT  01/1995  . ESOPHAGOGASTRODUODENOSCOPY  07/24/2010   RNGE:XBMWUXesophagus  . ESOPHAGOGASTRODUODENOSCOPY  04/2014   [facility] small bowel biopsies  . FOOT SURGERY Bilateral two  . KNEE SURGERY  two  . POLYPECTOMY  07/29/2017   Procedure: POLYPECTOMY;  Surgeon: RDaneil Dolin MD;  Location: [facility];  Service: Endoscopy;;  splenic flexure, ascending colon polyp;rectal  . SHOULDER SURGERY  two  . TONSILLECTOMY    . TRANSTHORACIC ECHOCARDIOGRAM  03/23/2009   EF 60-65%, normal LV systolic function  . ULNAR HEAD EXCISION Left 01/17/2017   Procedure: LEFT ULNAR HEAD RESECTION;  Surgeon: HCarole Civil MD;  Location: [facility] ORS;  Service: Orthopedics;  Laterality: Left;   Social History   Socioeconomic History  .  Marital status: Widowed    Spouse name: Not on file  . Number of children: Not on file  . Years of education: Not on file  . Highest education level: Not on file  Occupational History  . 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 Financial resource strain: Not on file  . Food insecurity    Worry: Not on file    Inability: Not on file  . Transportation needs    Medical: Not on file    Non-medical: Not on file  Tobacco Use  .  Smoking status: Former Smoker    Packs/day: 1.00    Years: 20.00    Pack years: 20.00    Types: Cigarettes    Quit date: 09/30/1969    Years since quitting: 49.2  . Smokeless tobacco: Never Used  Substance and Sexual Activity  . Alcohol use: No  . Drug use: No  . Sexual activity: Yes    Partners: Female    Birth control/protection: None    Comment: spouse  Lifestyle  . Physical activity    Days per week: Not on file    Minutes per session: Not on file  . Stress: Not on file  Relationships  . Social Herbalist on phone: Not on file    Gets together: Not on file    Attends religious service: Not on file    Active member of club or organization: Not on file    Attends meetings of clubs or organizations: Not on file    Relationship status: Not on file  Other Topics Concern  . Not on file  Social History Narrative  . Not on file   Family History  Problem Relation Age of Onset  . Lung cancer Mother   . Cancer Mother        breast  . Diabetes Mother   . Stroke Father   . Hypertension Father   . Kidney failure Brother   . Other Child        blood infection  . Colon cancer Neg Hx    No Known Allergies Prior to Admission medications   Medication Sig Start Date End Date Taking? Authorizing Provider  acetaminophen (TYLENOL) 325 MG tablet Take 650 mg by mouth every 6 (six) hours as needed for moderate pain or headache.    [provider]  albuterol (VENTOLIN HFA) 108 (90 Base) MCG/ACT inhaler Inhale 2 puffs into the lungs every 6 (six) hours as needed for wheezing or shortness of breath. 12/23/18   Sheth, Devam P, MD  amLODipine (NORVASC) 2.5 MG tablet Take 1 tablet (2.5 mg total) by mouth every morning. 11/06/18   [name], MD  aspirin 81 MG tablet Take 1 tablet (81 mg total) by mouth at bedtime. 03/18/18   Patrecia Pour, MD  balsalazide (COLAZAL) 750 MG capsule Take 3 capsules (2,250 mg total) by mouth 3 (three) times daily. 08/26/18 12/24/18  Carlis Stable, NP  clonazePAM (KLONOPIN) 0.5 MG tablet Take 1/2-1 tablet po BID PRN agitation. Caution:drowsiness; use sparingly 12/23/18   [name], MD  doxazosin (CARDURA) 4 MG tablet Take one tablet by mouth at bedtime Patient taking differently: Take 4 mg by mouth at bedtime.  11/06/18   [name], MD  furosemide (LASIX) 40 MG tablet Take 1 tablet (40 mg total) by mouth every morning. 12/23/18   Sheth, Vickii Chafe, MD  gabapentin (NEURONTIN) 300 MG capsule Take 300 mg  by mouth 2 (two) times daily.    [provider]  loperamide (IMODIUM) 2 MG capsule Take 2 mg by mouth daily.    [provider]  losartan (COZAAR) 25 MG tablet Take 1 tablet (25 mg total) by mouth daily. 12/23/18   Sheth, Vickii Chafe, MD  metoprolol tartrate (LOPRESSOR) 50 MG tablet One po QHS Patient taking differently: Take 50 mg by mouth at bedtime.  11/06/18   [name], MD  mupirocin ointment (BACTROBAN) 2 % Apply thin layer to affected area once a day 11/06/18   [name], MD  nitroGLYCERIN (NITROSTAT) 0.4 MG SL tablet Place 0.4 mg under the tongue every 5 (five) minutes as needed for chest pain.    [provider]  pravastatin (PRAVACHOL) 40 MG tablet Take 1 tablet (40 mg total) by mouth at bedtime. 11/24/18   Lendon Colonel, NP  predniSONE (DELTASONE) 20 MG tablet 2 tabs for 3 days, then 1 tab for 3 days and then stop 12/23/18   [name], MD  sertraline (ZOLOFT) 100 MG tablet Take one tablet daily 12/24/18   [name], MD  tiotropium (SPIRIVA HANDIHALER) 18 MCG inhalation capsule One inhalation po daily 11/06/18   Luking, Elayne Snare, MD  triamcinolone cream (KENALOG) 0.1 % Apply twice daily as needed for psoriasis. 04/22/18   [name], MD     Positive ROS: positive for decreased hearing; otherwise negative  All other systems have been reviewed and were otherwise negative with the exception of those mentioned in the HPI and as above.  Physical  Exam: Constitutional: Alert, well-appearing, no acute distress Ears: External ears without lesions or tenderness. Ear canals are bilaterally obstructed, R>L with dry skin and wax. After removal, right ear canal irritated and bilateral ears with dry skin but otherwise clear. TM clear and intact bilaterally.  Nasal: External nose without lesions. Clear nasal passages Oral: Oropharynx clear. Neck: Very limited ROM in neck. Only able to turn head ~30 degrees. No palpable adenopathy or masses Respiratory: Breathing comfortably  Skin: No facial/neck lesions or rash noted.  Cerumen impaction removal  Date/Time: 12/30/2018 2:19 PM Performed by: Glean Hess, Christin M, PA-C Authorized by: Rozetta Nunnery, MD   Consent:    Consent obtained:  Verbal   Consent given by:  Patient Procedure details:    Location:  R ear and L ear   Procedure type: curette, suction and forceps   Post-procedure details:    Inspection:  TM intact (R ear canal with irritated skin; CSF powder applied; left ear canal clear)   Hearing quality:  Improved   Patient tolerance of procedure:  Tolerated well, no immediate complications    Assessment: Cerumen impaction, bilateral Eczemoid otitis externa  Plan: Ears cleaned in office with improvement in symptoms. Provided patient prescription for Fluocinolone 0.01% 4 drops BID x3 days PRN itching. He will return as needed.  Christin Hoffstadt, PA-C  I have personally seen and examined this patient. I agree with the assessment and plan as outlined above. Radene Journey, MD

## 2019-01-05 ENCOUNTER — Telehealth (HOSPITAL_COMMUNITY): Payer: Self-pay

## 2019-01-05 DIAGNOSIS — J439 Emphysema, unspecified: Secondary | ICD-10-CM | POA: Diagnosis not present

## 2019-01-05 DIAGNOSIS — I110 Hypertensive heart disease with heart failure: Secondary | ICD-10-CM | POA: Diagnosis not present

## 2019-01-05 DIAGNOSIS — G4733 Obstructive sleep apnea (adult) (pediatric): Secondary | ICD-10-CM | POA: Diagnosis not present

## 2019-01-05 DIAGNOSIS — I5030 Unspecified diastolic (congestive) heart failure: Secondary | ICD-10-CM | POA: Diagnosis not present

## 2019-01-05 DIAGNOSIS — I2510 Atherosclerotic heart disease of native coronary artery without angina pectoris: Secondary | ICD-10-CM | POA: Diagnosis not present

## 2019-01-05 DIAGNOSIS — J9601 Acute respiratory failure with hypoxia: Secondary | ICD-10-CM | POA: Diagnosis not present

## 2019-01-05 NOTE — Telephone Encounter (Signed)
Pt insurance is active and benefits verified through [facility] Medicare. Co-pay $20.00, DED $0.00/$0.00 met, out of pocket $3,600.00/$384.29 met, co-insurance 0%. No pre-authorization required. Joann/[employer], 01/05/2019 @ 408PM, REF# [id number]  Will pass to RN Navigator for review.

## 2019-01-06 ENCOUNTER — Telehealth (HOSPITAL_COMMUNITY): Payer: Self-pay

## 2019-01-06 NOTE — Telephone Encounter (Signed)
Successful telephone encounter to [facility], physical therapist with [facility] who confirms continued engagement with patient for [facility]. States patient is also receiving [place] bath aide and services to continue until 01/20/2019. Pulmonary rehab staff informed of patient status and will follow up with patient/daughter to schedule pulmonary rehab orientation post discharge from [facility] services. Katye Valek E. Laray Anger, BSN

## 2019-01-07 ENCOUNTER — Telehealth (HOSPITAL_COMMUNITY): Payer: Self-pay

## 2019-01-09 DIAGNOSIS — I110 Hypertensive heart disease with heart failure: Secondary | ICD-10-CM | POA: Diagnosis not present

## 2019-01-09 DIAGNOSIS — I5030 Unspecified diastolic (congestive) heart failure: Secondary | ICD-10-CM

## 2019-01-09 DIAGNOSIS — I2510 Atherosclerotic heart disease of native coronary artery without angina pectoris: Secondary | ICD-10-CM | POA: Diagnosis not present

## 2019-01-09 DIAGNOSIS — J9601 Acute respiratory failure with hypoxia: Secondary | ICD-10-CM | POA: Diagnosis not present

## 2019-01-09 DIAGNOSIS — J439 Emphysema, unspecified: Secondary | ICD-10-CM | POA: Diagnosis not present

## 2019-01-12 ENCOUNTER — Telehealth: Payer: Self-pay | Admitting: Family Medicine

## 2019-01-12 DIAGNOSIS — J9601 Acute respiratory failure with hypoxia: Secondary | ICD-10-CM | POA: Diagnosis not present

## 2019-01-12 DIAGNOSIS — J439 Emphysema, unspecified: Secondary | ICD-10-CM | POA: Diagnosis not present

## 2019-01-12 DIAGNOSIS — I5030 Unspecified diastolic (congestive) heart failure: Secondary | ICD-10-CM | POA: Diagnosis not present

## 2019-01-12 DIAGNOSIS — I2510 Atherosclerotic heart disease of native coronary artery without angina pectoris: Secondary | ICD-10-CM | POA: Diagnosis not present

## 2019-01-12 DIAGNOSIS — I110 Hypertensive heart disease with heart failure: Secondary | ICD-10-CM | POA: Diagnosis not present

## 2019-01-12 NOTE — Telephone Encounter (Signed)
Please go ahead and give verbal orders thank you

## 2019-01-12 NOTE — Telephone Encounter (Signed)
Verbal orders give to physical therapist

## 2019-01-12 NOTE — Telephone Encounter (Signed)
Verbal orders given to pt

## 2019-01-12 NOTE — Telephone Encounter (Signed)
[name] with [facility] calling requesting verbal orders to continue PT and home health aid.  Frequency 1x1 weeks then 2x3 weeks  CB# [phone]

## 2019-01-13 ENCOUNTER — Telehealth: Payer: Self-pay | Admitting: Family Medicine

## 2019-01-13 NOTE — Telephone Encounter (Signed)
Daughter (DPR) stated she will contact the insurance and the home health agencies and see if it is possible to switch and let us know if she needs anything from our office.

## 2019-01-13 NOTE — Telephone Encounter (Signed)
Pt's daughter calling to see if oxygen order can be changed from [facility] to [facility]. [facility] the machine is way to big and heavy for patient to carry. The one with [facility] is quieter and easier for pt to handle.

## 2019-01-13 NOTE — Telephone Encounter (Signed)
As long as the home health agencies will go along with all of that that is all right with me

## 2019-01-14 ENCOUNTER — Telehealth: Payer: Self-pay | Admitting: Family Medicine

## 2019-01-14 NOTE — Telephone Encounter (Signed)
[facility] fax number [phone]

## 2019-01-14 NOTE — Telephone Encounter (Signed)
Nurses  pleaswe verify with family I believe it is 4l per [place] 24 hours a day

## 2019-01-14 NOTE — Telephone Encounter (Signed)
Pt's daughter has called insurance about oxygen and they have approved the switch.   She just needs an order faxed to [facility] for 4 liters of O2.

## 2019-01-14 NOTE — Telephone Encounter (Signed)
Prescription faxed to [place]. [name] notified.

## 2019-01-15 DIAGNOSIS — I2510 Atherosclerotic heart disease of native coronary artery without angina pectoris: Secondary | ICD-10-CM | POA: Diagnosis not present

## 2019-01-15 DIAGNOSIS — I110 Hypertensive heart disease with heart failure: Secondary | ICD-10-CM | POA: Diagnosis not present

## 2019-01-15 DIAGNOSIS — J9601 Acute respiratory failure with hypoxia: Secondary | ICD-10-CM | POA: Diagnosis not present

## 2019-01-15 DIAGNOSIS — I5030 Unspecified diastolic (congestive) heart failure: Secondary | ICD-10-CM | POA: Diagnosis not present

## 2019-01-15 DIAGNOSIS — J439 Emphysema, unspecified: Secondary | ICD-10-CM | POA: Diagnosis not present

## 2019-01-18 ENCOUNTER — Other Ambulatory Visit: Payer: Self-pay | Admitting: Family Medicine

## 2019-01-19 ENCOUNTER — Other Ambulatory Visit: Payer: Self-pay

## 2019-01-19 DIAGNOSIS — J9601 Acute respiratory failure with hypoxia: Secondary | ICD-10-CM | POA: Diagnosis not present

## 2019-01-19 DIAGNOSIS — I5030 Unspecified diastolic (congestive) heart failure: Secondary | ICD-10-CM | POA: Diagnosis not present

## 2019-01-19 DIAGNOSIS — J439 Emphysema, unspecified: Secondary | ICD-10-CM | POA: Diagnosis not present

## 2019-01-19 DIAGNOSIS — I110 Hypertensive heart disease with heart failure: Secondary | ICD-10-CM | POA: Diagnosis not present

## 2019-01-19 DIAGNOSIS — I2510 Atherosclerotic heart disease of native coronary artery without angina pectoris: Secondary | ICD-10-CM | POA: Diagnosis not present

## 2019-01-19 NOTE — Patient Outreach (Signed)
[facility]) Care Management  01/19/2019  [name] 1940-11-07 [id number]   Medication Adherence call to Mr. [name] [facility] Identifiers Verify spoke with patients daughter,patient is showing past due on Losartan 25 mg,daughter explain patient is no longer taking this medication,patient is now taking Metoprolol and Amlodipine. [name] is showing past due under [place].   [place] Management Direct Dial [phone]  Fax [phone] [name]

## 2019-01-20 ENCOUNTER — Telehealth: Payer: Self-pay | Admitting: Family Medicine

## 2019-01-20 NOTE — Telephone Encounter (Signed)
Contacted Angie to let her know we will be faxing the script over with pneumonia shots. Verbalized understanding.   Daughter is wanting to know if patient is supposed to be on Metoprolol and Losartan. Please advise. Thank you

## 2019-01-20 NOTE — Telephone Encounter (Signed)
Pt's daughter calling stating they went to pharmacy yesterday to get shingles shot and pneumonia shot. The pharmacy would not give pneumonia shot with out [place] faxing over a prescription to them with the last pneumonia shot he received.   [facility] [place], [place]

## 2019-01-20 NOTE — Telephone Encounter (Signed)
I have written out previous pneumonia immunization on a script. Awaiting signature at nurses station then will fax to pharmacy, if agreed. Please advise. Thank you

## 2019-01-21 DIAGNOSIS — J439 Emphysema, unspecified: Secondary | ICD-10-CM | POA: Diagnosis not present

## 2019-01-21 DIAGNOSIS — J9601 Acute respiratory failure with hypoxia: Secondary | ICD-10-CM | POA: Diagnosis not present

## 2019-01-21 DIAGNOSIS — I2510 Atherosclerotic heart disease of native coronary artery without angina pectoris: Secondary | ICD-10-CM | POA: Diagnosis not present

## 2019-01-21 DIAGNOSIS — I5030 Unspecified diastolic (congestive) heart failure: Secondary | ICD-10-CM | POA: Diagnosis not present

## 2019-01-21 DIAGNOSIS — I110 Hypertensive heart disease with heart failure: Secondary | ICD-10-CM | POA: Diagnosis not present

## 2019-01-21 NOTE — Telephone Encounter (Signed)
Metoprolol helps with heart failure and keeping the heart from running fast Losartan helps with blood pressure and heart function I would recommend though if the blood pressure is running low to notify us we may need to reduce the metoprolol

## 2019-01-22 DIAGNOSIS — K529 Noninfective gastroenteritis and colitis, unspecified: Secondary | ICD-10-CM | POA: Diagnosis not present

## 2019-01-22 DIAGNOSIS — K5130 Ulcerative (chronic) rectosigmoiditis without complications: Secondary | ICD-10-CM | POA: Diagnosis not present

## 2019-01-22 NOTE — Telephone Encounter (Signed)
Left message to return call 

## 2019-01-22 NOTE — Telephone Encounter (Signed)
Discussed with pt's daughter [name] and she verbalized understanding.

## 2019-01-23 ENCOUNTER — Telehealth: Payer: Self-pay | Admitting: Family Medicine

## 2019-01-23 NOTE — Telephone Encounter (Signed)
Left message to return call 

## 2019-01-23 NOTE — Telephone Encounter (Signed)
Nurses Please confirm with family/patient You might even have to call the oxygen provider [facility] The highlighted question #4 is family ordering portable oxygen or is just more so continuous oxygen?  Please fill in appropriately Please see sheet Also please put in diagnoses for COPD and hypoxia If any trouble please let me know thank you

## 2019-01-26 DIAGNOSIS — J9601 Acute respiratory failure with hypoxia: Secondary | ICD-10-CM | POA: Diagnosis not present

## 2019-01-26 DIAGNOSIS — I2510 Atherosclerotic heart disease of native coronary artery without angina pectoris: Secondary | ICD-10-CM | POA: Diagnosis not present

## 2019-01-26 DIAGNOSIS — I5030 Unspecified diastolic (congestive) heart failure: Secondary | ICD-10-CM | POA: Diagnosis not present

## 2019-01-26 DIAGNOSIS — I110 Hypertensive heart disease with heart failure: Secondary | ICD-10-CM | POA: Diagnosis not present

## 2019-01-26 DIAGNOSIS — J439 Emphysema, unspecified: Secondary | ICD-10-CM | POA: Diagnosis not present

## 2019-01-26 NOTE — Telephone Encounter (Signed)
Left message with pt's daughter [name] to return call

## 2019-01-28 DIAGNOSIS — J439 Emphysema, unspecified: Secondary | ICD-10-CM | POA: Diagnosis not present

## 2019-01-28 DIAGNOSIS — J9601 Acute respiratory failure with hypoxia: Secondary | ICD-10-CM | POA: Diagnosis not present

## 2019-01-28 DIAGNOSIS — I5030 Unspecified diastolic (congestive) heart failure: Secondary | ICD-10-CM | POA: Diagnosis not present

## 2019-01-28 DIAGNOSIS — I2510 Atherosclerotic heart disease of native coronary artery without angina pectoris: Secondary | ICD-10-CM | POA: Diagnosis not present

## 2019-01-28 DIAGNOSIS — I110 Hypertensive heart disease with heart failure: Secondary | ICD-10-CM | POA: Diagnosis not present

## 2019-01-29 DIAGNOSIS — I5030 Unspecified diastolic (congestive) heart failure: Secondary | ICD-10-CM | POA: Diagnosis not present

## 2019-01-29 DIAGNOSIS — I2510 Atherosclerotic heart disease of native coronary artery without angina pectoris: Secondary | ICD-10-CM | POA: Diagnosis not present

## 2019-01-29 DIAGNOSIS — J439 Emphysema, unspecified: Secondary | ICD-10-CM | POA: Diagnosis not present

## 2019-01-29 DIAGNOSIS — J9601 Acute respiratory failure with hypoxia: Secondary | ICD-10-CM | POA: Diagnosis not present

## 2019-01-29 DIAGNOSIS — I110 Hypertensive heart disease with heart failure: Secondary | ICD-10-CM | POA: Diagnosis not present

## 2019-02-02 DIAGNOSIS — I110 Hypertensive heart disease with heart failure: Secondary | ICD-10-CM | POA: Diagnosis not present

## 2019-02-02 DIAGNOSIS — J439 Emphysema, unspecified: Secondary | ICD-10-CM | POA: Diagnosis not present

## 2019-02-02 DIAGNOSIS — J9601 Acute respiratory failure with hypoxia: Secondary | ICD-10-CM | POA: Diagnosis not present

## 2019-02-02 DIAGNOSIS — I2510 Atherosclerotic heart disease of native coronary artery without angina pectoris: Secondary | ICD-10-CM | POA: Diagnosis not present

## 2019-02-02 DIAGNOSIS — I5030 Unspecified diastolic (congestive) heart failure: Secondary | ICD-10-CM | POA: Diagnosis not present

## 2019-02-03 ENCOUNTER — Other Ambulatory Visit: Payer: Self-pay | Admitting: Nurse Practitioner

## 2019-02-03 DIAGNOSIS — K5190 Ulcerative colitis, unspecified, without complications: Secondary | ICD-10-CM

## 2019-02-03 NOTE — Telephone Encounter (Signed)
Daughter stated they got the oxygen thru [place] and he is doing well- he saw specialist and is doing better.

## 2019-02-03 NOTE — Telephone Encounter (Signed)
Left message to return call 

## 2019-02-04 DIAGNOSIS — I2510 Atherosclerotic heart disease of native coronary artery without angina pectoris: Secondary | ICD-10-CM | POA: Diagnosis not present

## 2019-02-04 DIAGNOSIS — J439 Emphysema, unspecified: Secondary | ICD-10-CM | POA: Diagnosis not present

## 2019-02-04 DIAGNOSIS — J9601 Acute respiratory failure with hypoxia: Secondary | ICD-10-CM | POA: Diagnosis not present

## 2019-02-04 DIAGNOSIS — I110 Hypertensive heart disease with heart failure: Secondary | ICD-10-CM | POA: Diagnosis not present

## 2019-02-04 DIAGNOSIS — I5030 Unspecified diastolic (congestive) heart failure: Secondary | ICD-10-CM | POA: Diagnosis not present

## 2019-02-05 DIAGNOSIS — I110 Hypertensive heart disease with heart failure: Secondary | ICD-10-CM | POA: Diagnosis not present

## 2019-02-05 DIAGNOSIS — G4733 Obstructive sleep apnea (adult) (pediatric): Secondary | ICD-10-CM | POA: Diagnosis not present

## 2019-02-05 DIAGNOSIS — J9601 Acute respiratory failure with hypoxia: Secondary | ICD-10-CM | POA: Diagnosis not present

## 2019-02-05 DIAGNOSIS — I2510 Atherosclerotic heart disease of native coronary artery without angina pectoris: Secondary | ICD-10-CM | POA: Diagnosis not present

## 2019-02-05 DIAGNOSIS — J439 Emphysema, unspecified: Secondary | ICD-10-CM | POA: Diagnosis not present

## 2019-02-05 DIAGNOSIS — I5030 Unspecified diastolic (congestive) heart failure: Secondary | ICD-10-CM | POA: Diagnosis not present

## 2019-02-09 DIAGNOSIS — I2510 Atherosclerotic heart disease of native coronary artery without angina pectoris: Secondary | ICD-10-CM | POA: Diagnosis not present

## 2019-02-09 DIAGNOSIS — I5030 Unspecified diastolic (congestive) heart failure: Secondary | ICD-10-CM | POA: Diagnosis not present

## 2019-02-09 DIAGNOSIS — I110 Hypertensive heart disease with heart failure: Secondary | ICD-10-CM | POA: Diagnosis not present

## 2019-02-09 DIAGNOSIS — J9601 Acute respiratory failure with hypoxia: Secondary | ICD-10-CM | POA: Diagnosis not present

## 2019-02-09 DIAGNOSIS — J439 Emphysema, unspecified: Secondary | ICD-10-CM | POA: Diagnosis not present

## 2019-02-13 ENCOUNTER — Telehealth: Payer: Self-pay | Admitting: Family Medicine

## 2019-02-13 NOTE — Telephone Encounter (Signed)
Nurses-this can be handled next week I received a request for pulmonary rehab In order to do this the patient has have up-to-date pulmonary function test within the past 2 years which he has not had Secondly during this pandemic the hospital is not doing pulmonary function testing This is only being done through the pulmonologist office Back in November we did a referral to pulmonary-not sure where that went or what the status is that. I would recommend that we get the patient in with pulmonology for their opinion and they can do a pulmonary function test in their office and they can help coordinate the patient for pulmonary rehab I am not against pulmonary rehab for this patient but with the high prevalence of Covid virus and the devastating effect it would have on [name] in my opinion I would recommend that he wait to do pulmonary rehab until he has had the vaccine-both part 1 and 2 If we need to reinitiate the referral to pulmonary please do so patient living in [place] it would be best to go with [facility] thank you

## 2019-02-16 ENCOUNTER — Other Ambulatory Visit: Payer: Self-pay | Admitting: *Deleted

## 2019-02-16 DIAGNOSIS — J449 Chronic obstructive pulmonary disease, unspecified: Secondary | ICD-10-CM

## 2019-02-16 NOTE — Telephone Encounter (Signed)
Referral to pulmonary put in. Left message with daughter [name] to return call to discuss with her

## 2019-02-17 NOTE — Telephone Encounter (Signed)
Left message to return call 

## 2019-02-19 NOTE — Telephone Encounter (Signed)
Form faxed back to pulm rehab per Dr.Scott

## 2019-02-19 NOTE — Telephone Encounter (Signed)
Discussed with pt's daughter and referral was put in

## 2019-02-20 DIAGNOSIS — J9601 Acute respiratory failure with hypoxia: Secondary | ICD-10-CM | POA: Diagnosis not present

## 2019-02-20 DIAGNOSIS — J441 Chronic obstructive pulmonary disease with (acute) exacerbation: Secondary | ICD-10-CM | POA: Diagnosis not present

## 2019-02-20 DIAGNOSIS — R0902 Hypoxemia: Secondary | ICD-10-CM | POA: Diagnosis not present

## 2019-02-23 ENCOUNTER — Encounter: Payer: Self-pay | Admitting: Family Medicine

## 2019-03-08 ENCOUNTER — Telehealth: Payer: Self-pay | Admitting: Family Medicine

## 2019-03-08 DIAGNOSIS — G4733 Obstructive sleep apnea (adult) (pediatric): Secondary | ICD-10-CM | POA: Diagnosis not present

## 2019-03-08 NOTE — Telephone Encounter (Signed)
Phone message Please assist with this request for information I would recommend the visit from November 06, 2018 since that was the last one face-to-face  Sheet from [place] is being routed back to the front thank you

## 2019-03-09 ENCOUNTER — Other Ambulatory Visit: Payer: Self-pay | Admitting: Nurse Practitioner

## 2019-03-09 DIAGNOSIS — K5190 Ulcerative colitis, unspecified, without complications: Secondary | ICD-10-CM

## 2019-03-21 ENCOUNTER — Ambulatory Visit: Payer: Medicare Other | Attending: Internal Medicine

## 2019-03-21 DIAGNOSIS — Z23 Encounter for immunization: Secondary | ICD-10-CM | POA: Insufficient documentation

## 2019-03-21 NOTE — Progress Notes (Signed)
   Covid-19 Vaccination Clinic  Name:  HY SWIATEK    MRN: [id number] DOB: [birth year]  03/21/2019  Mr. Leoni was observed post Covid-19 immunization for 15 minutes without incidence. He was provided with Vaccine Information Sheet and instruction to access the V-Safe system.   Mr. Barg was instructed to call 911 with any severe reactions post vaccine: Marland Kitchen Difficulty breathing  . Swelling of your face and throat  . A fast heartbeat  . A bad rash all over your body  . Dizziness and weakness    Immunizations Administered    Name Date Dose VIS Date Route   Pfizer COVID-19 Vaccine 03/21/2019  9:25 AM 0.3 mL 01/16/2019 Intramuscular   Manufacturer: [place]   Lot: [id number]   [place]: [id number]

## 2019-03-23 DIAGNOSIS — J441 Chronic obstructive pulmonary disease with (acute) exacerbation: Secondary | ICD-10-CM | POA: Diagnosis not present

## 2019-03-23 DIAGNOSIS — J9601 Acute respiratory failure with hypoxia: Secondary | ICD-10-CM | POA: Diagnosis not present

## 2019-03-23 DIAGNOSIS — R0902 Hypoxemia: Secondary | ICD-10-CM | POA: Diagnosis not present

## 2019-04-05 DIAGNOSIS — G4733 Obstructive sleep apnea (adult) (pediatric): Secondary | ICD-10-CM | POA: Diagnosis not present

## 2019-04-06 ENCOUNTER — Telehealth: Payer: Self-pay | Admitting: Family Medicine

## 2019-04-06 NOTE — Telephone Encounter (Signed)
Pt's daughter has tested positive for COVID. Pt lives with them. She is wanting to know if he should be tested as well. She was tested on 2/27 and got results back today.   He is having same symptoms as her, congestion, headache.   No lose of smell or taste, no fever or SOB.

## 2019-04-06 NOTE — Telephone Encounter (Signed)
Left message to return call 

## 2019-04-06 NOTE — Telephone Encounter (Signed)
Nurses Please coach them how to get testing ASAP If his test is positive very important they alert [place] Please track this accordingly in other words follow-up on it this week If test is positive if I am out of the office please alert Dr. Richardson Landry patient would need referral to infusion center The patient would qualify for infusion to help protect him if his test is positive As patient had Covid vaccine at this point?

## 2019-04-07 NOTE — Telephone Encounter (Signed)
Discussed with daughter who states she will arrange patient to get tested today.

## 2019-04-08 ENCOUNTER — Other Ambulatory Visit: Payer: Self-pay

## 2019-04-08 ENCOUNTER — Emergency Department (HOSPITAL_COMMUNITY): Payer: Medicare Other

## 2019-04-08 ENCOUNTER — Encounter (HOSPITAL_COMMUNITY): Payer: Self-pay

## 2019-04-08 ENCOUNTER — Inpatient Hospital Stay (HOSPITAL_COMMUNITY)
Admission: EM | Admit: 2019-04-08 | Discharge: 2019-04-13 | DRG: 177 | Disposition: A | Discharge status: Home / Self Care | Payer: Medicare Other | Attending: Internal Medicine | Admitting: Internal Medicine

## 2019-04-08 DIAGNOSIS — Z87891 Personal history of nicotine dependence: Secondary | ICD-10-CM | POA: Diagnosis not present

## 2019-04-08 DIAGNOSIS — Z7982 Long term (current) use of aspirin: Secondary | ICD-10-CM

## 2019-04-08 DIAGNOSIS — I5032 Chronic diastolic (congestive) heart failure: Secondary | ICD-10-CM

## 2019-04-08 DIAGNOSIS — U071 COVID-19: Secondary | ICD-10-CM | POA: Diagnosis present

## 2019-04-08 DIAGNOSIS — J440 Chronic obstructive pulmonary disease with acute lower respiratory infection: Secondary | ICD-10-CM | POA: Diagnosis not present

## 2019-04-08 DIAGNOSIS — N401 Enlarged prostate with lower urinary tract symptoms: Secondary | ICD-10-CM | POA: Diagnosis not present

## 2019-04-08 DIAGNOSIS — F329 Major depressive disorder, single episode, unspecified: Secondary | ICD-10-CM | POA: Diagnosis present

## 2019-04-08 DIAGNOSIS — J449 Chronic obstructive pulmonary disease, unspecified: Secondary | ICD-10-CM | POA: Diagnosis present

## 2019-04-08 DIAGNOSIS — K5130 Ulcerative (chronic) rectosigmoiditis without complications: Secondary | ICD-10-CM | POA: Diagnosis present

## 2019-04-08 DIAGNOSIS — E669 Obesity, unspecified: Secondary | ICD-10-CM | POA: Diagnosis not present

## 2019-04-08 DIAGNOSIS — I10 Essential (primary) hypertension: Secondary | ICD-10-CM | POA: Diagnosis present

## 2019-04-08 DIAGNOSIS — Z6837 Body mass index (BMI) 37.0-37.9, adult: Secondary | ICD-10-CM

## 2019-04-08 DIAGNOSIS — Z8249 Family history of ischemic heart disease and other diseases of the circulatory system: Secondary | ICD-10-CM | POA: Diagnosis not present

## 2019-04-08 DIAGNOSIS — Z7951 Long term (current) use of inhaled steroids: Secondary | ICD-10-CM | POA: Diagnosis not present

## 2019-04-08 DIAGNOSIS — Z955 Presence of coronary angioplasty implant and graft: Secondary | ICD-10-CM | POA: Diagnosis not present

## 2019-04-08 DIAGNOSIS — J1282 Pneumonia due to coronavirus disease 2019: Secondary | ICD-10-CM | POA: Diagnosis present

## 2019-04-08 DIAGNOSIS — R0602 Shortness of breath: Secondary | ICD-10-CM | POA: Diagnosis present

## 2019-04-08 DIAGNOSIS — K51311 Ulcerative (chronic) rectosigmoiditis with rectal bleeding: Secondary | ICD-10-CM | POA: Diagnosis present

## 2019-04-08 DIAGNOSIS — J9622 Acute and chronic respiratory failure with hypercapnia: Secondary | ICD-10-CM

## 2019-04-08 DIAGNOSIS — E785 Hyperlipidemia, unspecified: Secondary | ICD-10-CM

## 2019-04-08 DIAGNOSIS — J9621 Acute and chronic respiratory failure with hypoxia: Secondary | ICD-10-CM

## 2019-04-08 DIAGNOSIS — I2510 Atherosclerotic heart disease of native coronary artery without angina pectoris: Secondary | ICD-10-CM | POA: Diagnosis not present

## 2019-04-08 DIAGNOSIS — E611 Iron deficiency: Secondary | ICD-10-CM

## 2019-04-08 DIAGNOSIS — G4733 Obstructive sleep apnea (adult) (pediatric): Secondary | ICD-10-CM | POA: Diagnosis not present

## 2019-04-08 DIAGNOSIS — Z743 Need for continuous supervision: Secondary | ICD-10-CM | POA: Diagnosis not present

## 2019-04-08 DIAGNOSIS — N400 Enlarged prostate without lower urinary tract symptoms: Secondary | ICD-10-CM | POA: Diagnosis present

## 2019-04-08 DIAGNOSIS — J9620 Acute and chronic respiratory failure, unspecified whether with hypoxia or hypercapnia: Secondary | ICD-10-CM | POA: Diagnosis not present

## 2019-04-08 DIAGNOSIS — Z9861 Coronary angioplasty status: Secondary | ICD-10-CM

## 2019-04-08 DIAGNOSIS — E66813 Obesity, class 3: Secondary | ICD-10-CM | POA: Diagnosis present

## 2019-04-08 DIAGNOSIS — Z9981 Dependence on supplemental oxygen: Secondary | ICD-10-CM | POA: Diagnosis not present

## 2019-04-08 DIAGNOSIS — K219 Gastro-esophageal reflux disease without esophagitis: Secondary | ICD-10-CM | POA: Diagnosis present

## 2019-04-08 DIAGNOSIS — J9611 Chronic respiratory failure with hypoxia: Secondary | ICD-10-CM | POA: Diagnosis present

## 2019-04-08 DIAGNOSIS — Z79899 Other long term (current) drug therapy: Secondary | ICD-10-CM

## 2019-04-08 DIAGNOSIS — K529 Noninfective gastroenteritis and colitis, unspecified: Secondary | ICD-10-CM | POA: Diagnosis present

## 2019-04-08 DIAGNOSIS — R197 Diarrhea, unspecified: Secondary | ICD-10-CM | POA: Diagnosis present

## 2019-04-08 DIAGNOSIS — R05 Cough: Secondary | ICD-10-CM | POA: Diagnosis not present

## 2019-04-08 LAB — CBC WITH DIFFERENTIAL/PLATELET
Abs Immature Granulocytes: 0.03 10*3/uL (ref 0.00–0.07)
Basophils Absolute: 0 10*3/uL (ref 0.0–0.1)
Basophils Relative: 0 %
Eosinophils Absolute: 0 10*3/uL (ref 0.0–0.5)
Eosinophils Relative: 0 %
HCT: 44 % (ref 39.0–52.0)
Hemoglobin: 13.8 g/dL (ref 13.0–17.0)
Immature Granulocytes: 0 %
Lymphocytes Relative: 15 %
Lymphs Abs: 1.4 10*3/uL (ref 0.7–4.0)
MCH: 29 pg (ref 26.0–34.0)
MCHC: 31.4 g/dL (ref 30.0–36.0)
MCV: 92.4 fL (ref 80.0–100.0)
Monocytes Absolute: 1.2 10*3/uL — ABNORMAL HIGH (ref 0.1–1.0)
Monocytes Relative: 13 %
Neutro Abs: 6.3 10*3/uL (ref 1.7–7.7)
Neutrophils Relative %: 72 %
Platelets: 125 10*3/uL — ABNORMAL LOW (ref 150–400)
RBC: 4.76 MIL/uL (ref 4.22–5.81)
RDW: 14.6 % (ref 11.5–15.5)
WBC: 8.9 10*3/uL (ref 4.0–10.5)
nRBC: 0 % (ref 0.0–0.2)

## 2019-04-08 LAB — COMPREHENSIVE METABOLIC PANEL
ALT: 17 U/L (ref 0–44)
AST: 22 U/L (ref 15–41)
Albumin: 3.2 g/dL — ABNORMAL LOW (ref 3.5–5.0)
Alkaline Phosphatase: 52 U/L (ref 38–126)
Anion gap: 10 (ref 5–15)
BUN: 22 mg/dL (ref 8–23)
CO2: 26 mmol/L (ref 22–32)
Calcium: 8.7 mg/dL — ABNORMAL LOW (ref 8.9–10.3)
Chloride: 104 mmol/L (ref 98–111)
Creatinine, Ser: 1.24 mg/dL (ref 0.61–1.24)
GFR calc Af Amer: >60 mL/min (ref >60)
GFR calc non Af Amer: 55 mL/min — ABNORMAL LOW (ref >60)
Glucose, Bld: 142 mg/dL — ABNORMAL HIGH (ref 70–99)
Potassium: 3.5 mmol/L (ref 3.5–5.1)
Sodium: 140 mmol/L (ref 135–145)
Total Bilirubin: 0.8 mg/dL (ref 0.3–1.2)
Total Protein: 6.8 g/dL (ref 6.5–8.1)

## 2019-04-08 LAB — PROTIME-INR
INR: 1.1 (ref 0.8–1.2)
Prothrombin Time: 13.6 seconds (ref 11.4–15.2)

## 2019-04-08 LAB — APTT: aPTT: 42 seconds — ABNORMAL HIGH (ref 24–36)

## 2019-04-08 LAB — LACTIC ACID, PLASMA: Lactic Acid, Venous: 1.6 mmol/L (ref 0.5–1.9)

## 2019-04-08 LAB — POC SARS CORONAVIRUS 2 AG -  ED: SARS Coronavirus 2 Ag: POSITIVE — AB

## 2019-04-08 MED ORDER — SODIUM CHLORIDE 0.9 % IV SOLN: 2.0000 g | INTRAVENOUS | Status: DC

## 2019-04-08 MED ORDER — SODIUM CHLORIDE 0.9 % IV SOLN: 500.0000 mg | INTRAVENOUS | Status: DC

## 2019-04-08 NOTE — ED Provider Notes (Signed)
11:30 PM  Assumed care from Dr. Zenia Resides.  Patient is an 79 year old male with history of COPD on chronic oxygen who presents to the emergency department with fevers, cough and weakness.  Has had his first Covid vaccination 2 weeks ago.  Found to be Covid positive today.  Chest x-ray shows patchy left basilar atelectasis versus pneumonia.  Suspect viral in nature.  Previous provider recommends admission.  Labs pending.  11:50 PM  Pt's labs reassuring.  Will discuss with hospitalist Dr. Zenia Resides recommendations.  12:07 AM Discussed patient's case with hospitalist, Dr. Velia Meyer.  I have recommended admission and patient (and family if present) agree with this plan. Admitting physician will place admission orders.   I reviewed all nursing notes, vitals, pertinent previous records and interpreted all EKGs, lab and urine results, imaging (as available).  [name] was evaluated in Emergency Department on 04/09/2019 for the symptoms described in the history of present illness. He was evaluated in the context of the global COVID-19 pandemic, which necessitated consideration that the patient might be at risk for infection with the SARS-CoV-2 virus that causes COVID-19. Institutional protocols and algorithms that pertain to the evaluation of patients at risk for COVID-19 are in a state of rapid change based on information released by regulatory bodies including the CDC and federal and state organizations. These policies and algorithms were followed during the patient's care in the ED.    [name], Chris Bison, DO 04/09/19 0007

## 2019-04-08 NOTE — ED Provider Notes (Signed)
[place] DEPT Provider Note   CSN: [id number] Arrival date & time: 04/08/19  2023     History No chief complaint on file.   RANSOME HELWIG is a 79 y.o. male.  79 year old male who presents with cough and weakness x24 hours.  Patient's daughter diagnosed with Covid 2 days ago.  Patient did receive the first dose of the Covid vaccine 2 weeks ago.  Denies any vomiting or diarrhea.  Does note some increased dyspnea peer denies any urinary symptoms.  Has been medicating with Tylenol at home for his fever.  Denies any abdominal or chest discomfort        Past Medical History:  Diagnosis Date  . Ankylosing spondylitis ([place]) 11/07/2018  . Asthma   . BPH (benign prostatic hyperplasia)   . CAD (coronary artery disease) 01/1995  . Clostridium difficile colitis 04/2005  . Colitis 2011  . COPD (chronic obstructive pulmonary disease) ([place])   . Depression   . Diverticulosis   . DJD (degenerative joint disease)   . Gastric ulcer 04/17/10   Three 47m gastric ulcers, H.pylori serologies were negative  . GERD (gastroesophageal reflux disease)   . History of kidney stones   . Hyperlipidemia   . Hypertension   . Idiopathic chronic inflammatory bowel disease 05/18/2010   left-sided UC  . Kidney stone   . Morbid obesity ([place] [facility] 03/12/2018  . Obstructive sleep apnea    on Cpap  . Reflux 02/1995  . S/P endoscopy 07/24/10   retained gastric contents, benign bx    Patient Active Problem List   Diagnosis Date Noted  . Acute on chronic respiratory failure with hypoxia and hypercapnia ([facility] 12/23/2018  . COPD with respiratory failure, acute ([facility] 12/23/2018  . Respiratory failure ([facility] 12/22/2018  . Acute respiratory failure with hypoxia ([facility] 12/21/2018  . Ankylosing spondylitis ([facility] 11/07/2018  . Rectal bleeding 08/26/2018  . Chronic diastolic CHF (congestive heart failure) ([facility] 03/27/2018  . Exertional dyspnea 03/15/2018  . Dyspnea 03/15/2018  . Morbid  obesity ([facility] 03/12/2018  . History of carpal tunnel surgery of left wrist with unlar head resection 01/17/17 01/24/2017  . Closed fracture of distal radius and ulna, left, with malunion, subsequent encounter   . Carpal tunnel syndrome of left wrist   . Fracture, [place], left, closed 11/29/2016  . Hx of skin cancer, basal cell 11/30/2015  . Erectile dysfunction 06/06/2015  . Iron deficiency 08/18/2014  . Fecal incontinence 02/10/2014  . Essential hypertension 01/09/2013  . Dyslipidemia, goal LDL below 70 01/09/2013  . Chest pain at rest 12/14/2012  . CAD S/P percutaneous coronary angioplasty 12/14/2012  . Hiatal hernia 12/14/2012  . GERD (gastroesophageal reflux disease) 12/14/2012  . Prediabetes 12/09/2012  . BPH (benign prostatic hyperplasia) 11/18/2012  . RUQ pain 09/04/2012  . Gallstones 05/09/2012  . COPD with acute exacerbation ([facility] 04/30/2012  . Obstructive sleep apnea 04/30/2012  . Ulcerative colitis ([facility] 04/29/2012  . Ulcerative colitis, left sided ([facility] 12/26/2011  . Diarrhea 10/18/2010    Past Surgical History:  Procedure Laterality Date  . ANORECTAL MANOMETRY  2016   [facility]: concern for possible fissure. Noted pelvic floor dyssnyergy  . BIOPSY  07/29/2017   Procedure: BIOPSY;  Surgeon: RDaneil Dolin MD;  Location: [facility] ENDO SUITE;  Service: Endoscopy;;  ascending and sigmoid colon  . CARDIAC CATHETERIZATION     with stent  . CARDIOVASCULAR STRESS TEST  07/21/2009   No scintigraphic evidence of inducible myocardial ischemia  . CARPAL TUNNEL RELEASE Left  01/17/2017   Procedure: LEFT CARPAL TUNNEL RELEASE;  Surgeon: Carole Civil, MD;  Location: [facility] ORS;  Service: Orthopedics;  Laterality: Left;  . CATARACT EXTRACTION, BILATERAL Bilateral   . CERVICAL SPINE SURGERY     C4-5  . COLONOSCOPY  04/2005   granularity and friability erosions from rectum to 40cm. Bx infection vs IBD. C. Diff positive at the time.   . COLONOSCOPY  05/2010   [facility]: left-sided UC, bx  with no dysplasia, shallow diverticula  . COLONOSCOPY [name] 11/07/2012   TKW:IOXB-DZHGD proctocolitis status post segmental biopsy/Sigmoid colon polyps removed as described above. Procedure compromisd by technical difficulties. bx: Inflammation limited to sigmoid and rectum on pathology.  . COLONOSCOPY  04/2014   [facility] Cow[facility]: well localized proctocolitis limited to sigmoid  . COLONOSCOPY WITH PROPOFOL [name] 07/29/2017   diverticulosis in colon, three 4-6 mm polyps at splenic flexure and in cecum, one 10 mm polyp in rectum, abnormal rectum and sigmoid consistent with active UC  . CORONARY STENT PLACEMENT  01/1995  . ESOPHAGOGASTRODUODENOSCOPY  07/24/2010   JME:QASTMH esophagus  . ESOPHAGOGASTRODUODENOSCOPY  04/2014   Dr. [facility]oka Cowden at [facility]: negative small bowel biopsies  . FOOT SURGERY Bilateral two  . KNEE SURGERY  two  . POLYPECTOMY  07/29/2017   Procedure: POLYPECTOMY;  Surgeon: Daneil Dolin, MD;  Location: [facility] ENDO SUITE;  Service: Endoscopy;;  splenic flexure, ascending colon polyp;rectal  . SHOULDER SURGERY  two  . TONSILLECTOMY    . TRANSTHORACIC ECHOCARDIOGRAM  03/23/2009   EF 60-65%, normal LV systolic function  . ULNAR HEAD EXCISION Left 01/17/2017   Procedure: LEFT ULNAR HEAD RESECTION;  Surgeon: Carole Civil, MD;  Location: [facility] ORS;  Service: Orthopedics;  Laterality: Left;       Family History  Problem Relation Age of Onset  . Lung cancer Mother   . Cancer Mother        breast  . Diabetes Mother   . Stroke Father   . Hypertension Father   . Kidney failure Brother   . Other Child        blood infection  . Colon cancer Neg Hx     Social History   Tobacco Use  . Smoking status: Former Smoker    Packs/day: 1.00    Years: 20.00    Pack years: 20.00    Types: Cigarettes    Quit date: 09/30/1969    Years since quitting: 49.5  . Smokeless tobacco: Never Used  Substance Use Topics  . Alcohol use: No  . Drug use: No    Home Medications Prior to  Admission medications   Medication Sig Start Date End Date Taking? Authorizing Provider  acetaminophen (TYLENOL) 325 MG tablet Take 650 mg by mouth every 6 (six) hours as needed for moderate pain or headache.    [provider]  albuterol (VENTOLIN HFA) 108 (90 Base) MCG/ACT inhaler Inhale 2 puffs into the lungs every 6 (six) hours as needed for wheezing or shortness of breath. 12/23/18   Sheth, Devam P, MD  amLODipine (NORVASC) 2.5 MG tablet Take 1 tablet (2.5 mg total) by mouth every morning. 11/06/18   [name], MD  aspirin 81 MG tablet Take 1 tablet (81 mg total) by mouth at bedtime. 03/18/18   Patrecia Pour, MD  balsalazide (COLAZAL) 750 MG capsule TAKE 3 CAPSULES BY MOUTH THREE TIMES DAILY 03/09/19   Erenest Rasher, PA-C  clonazePAM (KLONOPIN) 0.5 MG tablet Take 1/2-1 tablet po BID PRN  agitation. Caution:drowsiness; use sparingly 12/23/18   [name], MD  doxazosin (CARDURA) 4 MG tablet Take one tablet by mouth at bedtime Patient taking differently: Take 4 mg by mouth at bedtime.  11/06/18   [name], MD  furosemide (LASIX) 40 MG tablet Take 1 tablet (40 mg total) by mouth every morning. 12/23/18   Sheth, Vickii Chafe, MD  gabapentin (NEURONTIN) 300 MG capsule Take 300 mg by mouth 2 (two) times daily.    [provider]  loperamide (IMODIUM) 2 MG capsule Take 2 mg by mouth daily.    [provider]  losartan (COZAAR) 25 MG tablet TAKE 1 TABLET BY MOUTH ONCE DAILY IN THE MORNING 01/19/19   [name], MD  metoprolol tartrate (LOPRESSOR) 50 MG tablet One po QHS Patient taking differently: Take 50 mg by mouth at bedtime.  11/06/18   [name], MD  mupirocin ointment (BACTROBAN) 2 % Apply thin layer to affected area once a day 11/06/18   [name], MD  nitroGLYCERIN (NITROSTAT) 0.4 MG SL tablet Place 0.4 mg under the tongue every 5 (five) minutes as needed for chest pain.    [provider]  pravastatin (PRAVACHOL) 40 MG tablet Take  1 tablet (40 mg total) by mouth at bedtime. 11/24/18   Lendon Colonel, NP  predniSONE (DELTASONE) 20 MG tablet 2 tabs for 3 days, then 1 tab for 3 days and then stop 12/23/18   [name], MD  sertraline (ZOLOFT) 100 MG tablet Take one tablet daily 12/24/18   [name], MD  tiotropium (SPIRIVA HANDIHALER) 18 MCG inhalation capsule One inhalation po daily 11/06/18   Luking, Elayne Snare, MD  triamcinolone cream (KENALOG) 0.1 % Apply twice daily as needed for psoriasis. 04/22/18   [name], MD    Allergies    Patient has no known allergies.  Review of Systems   Review of Systems  Unable to perform ROS: Acuity of condition    Physical Exam Updated Vital Signs BP (!) 147/65 (BP Location: Left Arm)   Pulse 79   Temp 99.9 F (37.7 C) (Oral)   Resp 20   Ht 1.854 m (6' 1" )   Wt 134.7 kg   SpO2 93%   BMI 39.18 kg/m   Physical Exam Vitals and nursing note reviewed.  Constitutional:      General: He is not in acute distress.    Appearance: Normal appearance. He is well-developed. He is not toxic-appearing.  HENT:     Head: Normocephalic and atraumatic.  Eyes:     General: Lids are normal.     Conjunctiva/sclera: Conjunctivae normal.     Pupils: Pupils are equal, round, and reactive to light.  Neck:     Thyroid: No thyroid mass.     Trachea: No tracheal deviation.  Cardiovascular:     Rate and Rhythm: Normal rate and regular rhythm.     Heart sounds: Normal heart sounds. No murmur. No gallop.   Pulmonary:     Effort: Pulmonary effort is normal. No respiratory distress.     Breath sounds: Normal breath sounds. No stridor. No decreased breath sounds, wheezing, rhonchi or rales.  Abdominal:     General: Bowel sounds are normal. There is no distension.     Palpations: Abdomen is soft.     Tenderness: There is no abdominal tenderness. There is no rebound.  Musculoskeletal:        General: No tenderness. Normal range of  motion.     Cervical back: Normal range of  motion and neck supple.  Skin:    General: Skin is warm and dry.     Findings: No abrasion or rash.  Neurological:     Mental Status: He is oriented to person, place, and time. He is lethargic.     GCS: GCS eye subscore is 4. GCS verbal subscore is 5. GCS motor subscore is 6.     Cranial Nerves: No cranial nerve deficit.     Sensory: No sensory deficit.     Comments: Patient moves all 4 extremities.  Psychiatric:        Attention and Perception: Attention normal.        Mood and Affect: Affect is flat.        Speech: Speech is delayed.     ED Results / Procedures / Treatments   Labs (all labs ordered are listed, but only abnormal results are displayed) Labs Reviewed  CULTURE, BLOOD (ROUTINE X 2)  CULTURE, BLOOD (ROUTINE X 2)  URINE CULTURE  INFLUENZA PANEL BY PCR (TYPE A & B)  LACTIC ACID, PLASMA  LACTIC ACID, PLASMA  COMPREHENSIVE METABOLIC PANEL  CBC WITH DIFFERENTIAL/PLATELET  APTT  PROTIME-INR  URINALYSIS, ROUTINE W REFLEX MICROSCOPIC  POC SARS CORONAVIRUS 2 AG -  ED    EKG None  Radiology No results found.  Procedures Procedures (including critical care time)  Medications Ordered in ED Medications - No data to display  ED Course  I have reviewed the triage vital signs and the nursing notes.  Pertinent labs & imaging results that were available during my care of the patient were reviewed by me and considered in my medical decision making (see chart for details).    MDM Rules/Calculators/A&P                      Labs are pending at this time and patient likely has pneumonia based on chest x-ray.  Will start on antibiotics.  Will sign out to Dr. Leonides Schanz Final Clinical Impression(s) / ED Diagnoses Final diagnoses:  None    Rx / DC Orders ED Discharge Orders    None       Lacretia Leigh, MD 04/08/19 2302

## 2019-04-08 NOTE — ED Triage Notes (Signed)
Per EMS, patient lives with daughter who was recently diagnosed with Covid. Patient has had first covid vaccine a few weeks ago, but started to have a productive cough (green sputum) and fever two days ago. Patient is a&ox4 and is usually on 4L nasal cannula at home due to COPD. EMS gave patient tylenol 1073m before arrival to [facility]

## 2019-04-08 NOTE — Telephone Encounter (Signed)
Family should be aware that if his test comes back positive they would need to connect with [place] so we could refer him to the infusion clinic (Phone number for infusion clinic [phone] would be necessary for [place] to call the clinic not the patient.  Please do not get this phone number out to the patient thank you)

## 2019-04-08 NOTE — Telephone Encounter (Signed)
Daughter states he had his test yesterday and they are awaiting results. Daughter states she had infusion therapy today and is aware how to set it up but will call us when the results come in. Daughter states the patient is feeling fine and doing well with just slight cough.

## 2019-04-09 ENCOUNTER — Other Ambulatory Visit: Payer: Self-pay

## 2019-04-09 ENCOUNTER — Encounter (HOSPITAL_COMMUNITY): Payer: Self-pay | Admitting: Internal Medicine

## 2019-04-09 DIAGNOSIS — Z6837 Body mass index (BMI) 37.0-37.9, adult: Secondary | ICD-10-CM | POA: Diagnosis not present

## 2019-04-09 DIAGNOSIS — J440 Chronic obstructive pulmonary disease with acute lower respiratory infection: Secondary | ICD-10-CM | POA: Diagnosis present

## 2019-04-09 DIAGNOSIS — J1282 Pneumonia due to coronavirus disease 2019: Secondary | ICD-10-CM | POA: Diagnosis present

## 2019-04-09 DIAGNOSIS — Z955 Presence of coronary angioplasty implant and graft: Secondary | ICD-10-CM | POA: Diagnosis not present

## 2019-04-09 DIAGNOSIS — K529 Noninfective gastroenteritis and colitis, unspecified: Secondary | ICD-10-CM | POA: Diagnosis present

## 2019-04-09 DIAGNOSIS — I2510 Atherosclerotic heart disease of native coronary artery without angina pectoris: Secondary | ICD-10-CM | POA: Diagnosis present

## 2019-04-09 DIAGNOSIS — J9611 Chronic respiratory failure with hypoxia: Secondary | ICD-10-CM | POA: Diagnosis present

## 2019-04-09 DIAGNOSIS — Z7982 Long term (current) use of aspirin: Secondary | ICD-10-CM | POA: Diagnosis not present

## 2019-04-09 DIAGNOSIS — J449 Chronic obstructive pulmonary disease, unspecified: Secondary | ICD-10-CM | POA: Diagnosis present

## 2019-04-09 DIAGNOSIS — K219 Gastro-esophageal reflux disease without esophagitis: Secondary | ICD-10-CM | POA: Diagnosis present

## 2019-04-09 DIAGNOSIS — Z87891 Personal history of nicotine dependence: Secondary | ICD-10-CM | POA: Diagnosis not present

## 2019-04-09 DIAGNOSIS — I10 Essential (primary) hypertension: Secondary | ICD-10-CM | POA: Diagnosis present

## 2019-04-09 DIAGNOSIS — R0602 Shortness of breath: Secondary | ICD-10-CM | POA: Diagnosis not present

## 2019-04-09 DIAGNOSIS — K5130 Ulcerative (chronic) rectosigmoiditis without complications: Secondary | ICD-10-CM | POA: Diagnosis present

## 2019-04-09 DIAGNOSIS — N400 Enlarged prostate without lower urinary tract symptoms: Secondary | ICD-10-CM | POA: Diagnosis present

## 2019-04-09 DIAGNOSIS — U071 COVID-19: Secondary | ICD-10-CM | POA: Diagnosis present

## 2019-04-09 DIAGNOSIS — E785 Hyperlipidemia, unspecified: Secondary | ICD-10-CM | POA: Diagnosis present

## 2019-04-09 DIAGNOSIS — Z9981 Dependence on supplemental oxygen: Secondary | ICD-10-CM | POA: Diagnosis not present

## 2019-04-09 DIAGNOSIS — Z79899 Other long term (current) drug therapy: Secondary | ICD-10-CM | POA: Diagnosis not present

## 2019-04-09 DIAGNOSIS — Z8249 Family history of ischemic heart disease and other diseases of the circulatory system: Secondary | ICD-10-CM | POA: Diagnosis not present

## 2019-04-09 DIAGNOSIS — K51311 Ulcerative (chronic) rectosigmoiditis with rectal bleeding: Secondary | ICD-10-CM | POA: Diagnosis present

## 2019-04-09 DIAGNOSIS — Z7951 Long term (current) use of inhaled steroids: Secondary | ICD-10-CM | POA: Diagnosis not present

## 2019-04-09 DIAGNOSIS — J9621 Acute and chronic respiratory failure with hypoxia: Secondary | ICD-10-CM | POA: Diagnosis present

## 2019-04-09 DIAGNOSIS — N401 Enlarged prostate with lower urinary tract symptoms: Secondary | ICD-10-CM | POA: Diagnosis not present

## 2019-04-09 DIAGNOSIS — F329 Major depressive disorder, single episode, unspecified: Secondary | ICD-10-CM | POA: Diagnosis present

## 2019-04-09 DIAGNOSIS — G4733 Obstructive sleep apnea (adult) (pediatric): Secondary | ICD-10-CM | POA: Diagnosis present

## 2019-04-09 DIAGNOSIS — E669 Obesity, unspecified: Secondary | ICD-10-CM | POA: Diagnosis present

## 2019-04-09 LAB — URINALYSIS, ROUTINE W REFLEX MICROSCOPIC
Bilirubin Urine: NEGATIVE
Glucose, UA: NEGATIVE mg/dL
Hgb urine dipstick: NEGATIVE
Ketones, ur: NEGATIVE mg/dL
Leukocytes,Ua: NEGATIVE
Nitrite: NEGATIVE
Protein, ur: 30 mg/dL — AB
Specific Gravity, Urine: 1.025 (ref 1.005–1.030)
pH: 5 (ref 5.0–8.0)

## 2019-04-09 LAB — MAGNESIUM
Magnesium: 2.1 mg/dL (ref 1.7–2.4)
Magnesium: 2 mg/dL (ref 1.7–2.4)

## 2019-04-09 LAB — CBC WITH DIFFERENTIAL/PLATELET
Abs Immature Granulocytes: 0.03 10*3/uL (ref 0.00–0.07)
Basophils Absolute: 0 10*3/uL (ref 0.0–0.1)
Basophils Relative: 1 %
Eosinophils Absolute: 0 10*3/uL (ref 0.0–0.5)
Eosinophils Relative: 0 %
HCT: 44.2 % (ref 39.0–52.0)
Hemoglobin: 13.9 g/dL (ref 13.0–17.0)
Immature Granulocytes: 1 %
Lymphocytes Relative: 19 %
Lymphs Abs: 1.2 10*3/uL (ref 0.7–4.0)
MCH: 29.3 pg (ref 26.0–34.0)
MCHC: 31.4 g/dL (ref 30.0–36.0)
MCV: 93.2 fL (ref 80.0–100.0)
Monocytes Absolute: 0.7 10*3/uL (ref 0.1–1.0)
Monocytes Relative: 11 %
Neutro Abs: 4.4 10*3/uL (ref 1.7–7.7)
Neutrophils Relative %: 68 %
Platelets: 114 10*3/uL — ABNORMAL LOW (ref 150–400)
RBC: 4.74 MIL/uL (ref 4.22–5.81)
RDW: 14.6 % (ref 11.5–15.5)
WBC: 6.4 10*3/uL (ref 4.0–10.5)
nRBC: 0 % (ref 0.0–0.2)

## 2019-04-09 LAB — COMPREHENSIVE METABOLIC PANEL
ALT: 17 U/L (ref 0–44)
AST: 24 U/L (ref 15–41)
Albumin: 3.2 g/dL — ABNORMAL LOW (ref 3.5–5.0)
Alkaline Phosphatase: 48 U/L (ref 38–126)
Anion gap: 9 (ref 5–15)
BUN: 21 mg/dL (ref 8–23)
CO2: 29 mmol/L (ref 22–32)
Calcium: 8.9 mg/dL (ref 8.9–10.3)
Chloride: 103 mmol/L (ref 98–111)
Creatinine, Ser: 1.16 mg/dL (ref 0.61–1.24)
GFR calc Af Amer: >60 mL/min (ref >60)
GFR calc non Af Amer: 59 mL/min — ABNORMAL LOW (ref >60)
Glucose, Bld: 159 mg/dL — ABNORMAL HIGH (ref 70–99)
Potassium: 3.5 mmol/L (ref 3.5–5.1)
Sodium: 141 mmol/L (ref 135–145)
Total Bilirubin: 0.8 mg/dL (ref 0.3–1.2)
Total Protein: 6.4 g/dL — ABNORMAL LOW (ref 6.5–8.1)

## 2019-04-09 LAB — FIBRINOGEN
Fibrinogen: 412 mg/dL (ref 210–475)
Fibrinogen: 424 mg/dL (ref 210–475)

## 2019-04-09 LAB — LACTATE DEHYDROGENASE
LDH: 114 U/L (ref 98–192)
LDH: 121 U/L (ref 98–192)

## 2019-04-09 LAB — PROCALCITONIN: Procalcitonin: <0.1 ng/mL

## 2019-04-09 LAB — INFLUENZA PANEL BY PCR (TYPE A & B)
Influenza A By PCR: NEGATIVE
Influenza B By PCR: NEGATIVE

## 2019-04-09 LAB — D-DIMER, QUANTITATIVE
D-Dimer, Quant: 1.66 ug/mL-FEU — ABNORMAL HIGH (ref 0.00–0.50)
D-Dimer, Quant: 1.56 ug/mL-FEU — ABNORMAL HIGH (ref 0.00–0.50)

## 2019-04-09 LAB — TRIGLYCERIDES: Triglycerides: 107 mg/dL (ref <150)

## 2019-04-09 LAB — C-REACTIVE PROTEIN
CRP: 6.7 mg/dL — ABNORMAL HIGH (ref <1.0)
CRP: 7.7 mg/dL — ABNORMAL HIGH (ref <1.0)

## 2019-04-09 LAB — PHOSPHORUS: Phosphorus: 3.8 mg/dL (ref 2.5–4.6)

## 2019-04-09 LAB — FERRITIN
Ferritin: 411 ng/mL — ABNORMAL HIGH (ref 24–336)
Ferritin: 461 ng/mL — ABNORMAL HIGH (ref 24–336)

## 2019-04-09 MED ORDER — METHYLPREDNISOLONE SODIUM SUCC 125 MG IJ SOLR
1.0000 mg/kg/d | Freq: Two times a day (BID) | INTRAMUSCULAR | Status: DC
  Administered 2019-04-09 – 2019-04-11 (×6): 65.625 mg via INTRAVENOUS
  Filled 2019-04-09 (×6): qty 2

## 2019-04-09 MED ORDER — METOPROLOL TARTRATE 50 MG PO TABS
50.0000 mg | ORAL_TABLET | Freq: Every day | ORAL | Status: DC
  Administered 2019-04-10 – 2019-04-12 (×3): 50 mg via ORAL
  Filled 2019-04-09 (×3): qty 1

## 2019-04-09 MED ORDER — SODIUM CHLORIDE 0.9% FLUSH
3.0000 mL | Freq: Two times a day (BID) | INTRAVENOUS | Status: DC
  Administered 2019-04-09 – 2019-04-13 (×10): 3 mL via INTRAVENOUS

## 2019-04-09 MED ORDER — IPRATROPIUM BROMIDE HFA 17 MCG/ACT IN AERS
2.0000 | INHALATION_SPRAY | Freq: Four times a day (QID) | RESPIRATORY_TRACT | Status: DC
  Administered 2019-04-09: 2 via RESPIRATORY_TRACT
  Filled 2019-04-09: qty 12.9

## 2019-04-09 MED ORDER — ALBUTEROL SULFATE HFA 108 (90 BASE) MCG/ACT IN AERS: 2.0000 | INHALATION_SPRAY | Freq: Four times a day (QID) | RESPIRATORY_TRACT | Status: DC

## 2019-04-09 MED ORDER — SODIUM CHLORIDE 0.9 % IV SOLN
100.0000 mg | INTRAVENOUS | Status: AC
  Administered 2019-04-09: 100 mg via INTRAVENOUS
  Filled 2019-04-09: qty 20

## 2019-04-09 MED ORDER — IPRATROPIUM-ALBUTEROL 20-100 MCG/ACT IN AERS: 1.0000 | INHALATION_SPRAY | Freq: Four times a day (QID) | RESPIRATORY_TRACT | Status: DC

## 2019-04-09 MED ORDER — ZINC OXIDE 40 % EX OINT
TOPICAL_OINTMENT | Freq: Four times a day (QID) | CUTANEOUS | Status: DC | PRN
  Filled 2019-04-09: qty 57

## 2019-04-09 MED ORDER — ALBUTEROL SULFATE HFA 108 (90 BASE) MCG/ACT IN AERS
2.0000 | INHALATION_SPRAY | Freq: Two times a day (BID) | RESPIRATORY_TRACT | Status: DC
  Administered 2019-04-09 – 2019-04-10 (×2): 2 via RESPIRATORY_TRACT
  Filled 2019-04-09: qty 6.7

## 2019-04-09 MED ORDER — AMLODIPINE BESYLATE 5 MG PO TABS
2.5000 mg | ORAL_TABLET | Freq: Every morning | ORAL | Status: DC
  Administered 2019-04-09 – 2019-04-12 (×4): 2.5 mg via ORAL
  Filled 2019-04-09 (×4): qty 1

## 2019-04-09 MED ORDER — PRAVASTATIN SODIUM 40 MG PO TABS
40.0000 mg | ORAL_TABLET | Freq: Every day | ORAL | Status: DC
  Administered 2019-04-10 – 2019-04-12 (×3): 40 mg via ORAL
  Filled 2019-04-09 (×3): qty 1

## 2019-04-09 MED ORDER — ASPIRIN EC 81 MG PO TBEC
81.0000 mg | DELAYED_RELEASE_TABLET | Freq: Every day | ORAL | Status: DC
  Administered 2019-04-10 – 2019-04-12 (×3): 81 mg via ORAL
  Filled 2019-04-09 (×3): qty 1

## 2019-04-09 MED ORDER — ALBUTEROL SULFATE HFA 108 (90 BASE) MCG/ACT IN AERS
1.0000 | INHALATION_SPRAY | RESPIRATORY_TRACT | Status: DC | PRN
  Administered 2019-04-10: 2 via RESPIRATORY_TRACT

## 2019-04-09 MED ORDER — FUROSEMIDE 40 MG PO TABS
40.0000 mg | ORAL_TABLET | Freq: Every morning | ORAL | Status: DC
  Administered 2019-04-09 – 2019-04-13 (×5): 40 mg via ORAL
  Filled 2019-04-09 (×5): qty 1

## 2019-04-09 MED ORDER — ACETAMINOPHEN 325 MG PO TABS
650.0000 mg | ORAL_TABLET | Freq: Four times a day (QID) | ORAL | Status: DC | PRN
  Administered 2019-04-09 (×2): 650 mg via ORAL
  Filled 2019-04-09 (×2): qty 2

## 2019-04-09 MED ORDER — BALSALAZIDE DISODIUM 750 MG PO CAPS
1500.0000 mg | ORAL_CAPSULE | Freq: Two times a day (BID) | ORAL | Status: DC
  Administered 2019-04-10 – 2019-04-13 (×7): 1500 mg via ORAL
  Filled 2019-04-09 (×15): qty 2

## 2019-04-09 MED ORDER — SERTRALINE HCL 100 MG PO TABS
100.0000 mg | ORAL_TABLET | Freq: Every day | ORAL | Status: DC
  Administered 2019-04-09 – 2019-04-13 (×5): 100 mg via ORAL
  Filled 2019-04-09 (×5): qty 1

## 2019-04-09 MED ORDER — DOXAZOSIN MESYLATE 4 MG PO TABS
4.0000 mg | ORAL_TABLET | Freq: Every day | ORAL | Status: DC
  Administered 2019-04-10 – 2019-04-12 (×3): 4 mg via ORAL
  Filled 2019-04-09 (×3): qty 1

## 2019-04-09 MED ORDER — IPRATROPIUM BROMIDE HFA 17 MCG/ACT IN AERS
2.0000 | INHALATION_SPRAY | Freq: Two times a day (BID) | RESPIRATORY_TRACT | Status: DC
  Administered 2019-04-09 – 2019-04-12 (×7): 2 via RESPIRATORY_TRACT
  Filled 2019-04-09: qty 12.9

## 2019-04-09 MED ORDER — ENOXAPARIN SODIUM 60 MG/0.6ML ~~LOC~~ SOLN
60.0000 mg | Freq: Every day | SUBCUTANEOUS | Status: DC
  Administered 2019-04-10 – 2019-04-13 (×4): 60 mg via SUBCUTANEOUS
  Filled 2019-04-09 (×4): qty 0.6

## 2019-04-09 MED ORDER — HYDRALAZINE HCL 20 MG/ML IJ SOLN: 5.0000 mg | Freq: Four times a day (QID) | INTRAMUSCULAR | Status: DC | PRN

## 2019-04-09 MED ORDER — SODIUM CHLORIDE 0.9 % IV SOLN
100.0000 mg | Freq: Every day | INTRAVENOUS | Status: AC
  Administered 2019-04-10 – 2019-04-13 (×4): 100 mg via INTRAVENOUS
  Filled 2019-04-09 (×3): qty 20
  Filled 2019-04-09: qty 100

## 2019-04-09 NOTE — Progress Notes (Signed)
Assisted patient OOB to chair. Tolerated fair. DOE noted. Meal served. Linens changed. Instructed to call for assistance PRN. Call light placed in patient's hand. Will continue to monitor.

## 2019-04-09 NOTE — Progress Notes (Signed)
Attempted to obtain ABG per order; unsuccessful. Patient requesting to not be stuck again. Bedside RN aware and message sent to MD.

## 2019-04-09 NOTE — H&P (Signed)
History and Physical    PLEASE NOTE THAT DRAGON DICTATION SOFTWARE WAS USED IN THE CONSTRUCTION OF THIS NOTE.   [name] HYI:[id number] DOB: 04-11-1940 DOA: 04/08/2019  PCP: [name], MD Patient coming from: home   I have personally briefly reviewed patient's old medical records in [place]  Chief Complaint: Shortness of breath  HPI: [name] is a 79 y.o. male with medical history significant for chronic hypoxic respiratory failure in the setting of severe COPD on 4 L continuous supplemental oxygen, hypertension, hyperlipidemia, who is admitted to [facility] on 04/08/2019 with COVID-19 pneumonia after presenting from home to [facility] complaining of shortness of breath.   The patient reports 1 to 2 days of new onset shortness of breath associated with nonproductive cough and subjective fever.  Denies any associated chest pain, palpitations, or diaphoresis.  Denies any associated chills, rigors, or generalized myalgias.  Reports mild bilateral frontal lobe headache, but denies any associated neck stiffness, rhinitis, rhinorrhea, sore throat, nausea, vomiting, abdominal pain, diarrhea, or rash.  Denies any recent traveling. Denies dysuria, gross hematuria, or change in urinary urgency/frequency.  Reports mild wheezing, which he states is consistent with his baseline respiratory status, without any recent significant worsening.  Denies any recent trauma or hemoptysis.  The patient reports that he spends time with his daughter nearly every day, and notes that she was just diagnosed with COVID-19 two days ago.   Past medical history is notable for history of severe COPD with associated chronic hypoxic respiratory failure on 4 L continuous supplemental oxygen at home.  The patient reports that this is in the context of being a former smoker, and confirms that he completely quit smoking in the 1970s.  Current outpatient respiratory  regimen appears limited to as needed albuterol inhaler.  The patient reports that he discontinued his outpatient Spiriva.  Past medical history also notable for hypertension as well as obesity, the patient denies any known history of underlying diabetes.    ED Course:  Vital signs in the ED were notable for the following: Temperature max 99.9; heart rate 55-79; blood pressure 123/60-147/65; respiratory rate 20-23; oxygen saturation 94 to 95% on baseline 4 L continuous oxygen.  Labs were notable for the following: CMP notable for the following: Sodium 140, potassium 3.5, bicarbonate 26, creatinine 1.24, liver enzymes were found to be within normal limits, including AST 22, and ALT 17.  Inflammatory markers were notable for the following: LDH found to be within normal limits at 114, ferritin elevated at 411, CRP elevated at 6.7, D-dimer elevated at 1.66, and fibrinogen found to be within normal limits at 412.  Procalcitonin less than 0.10.  CBC notable for white blood cell count of 8900.  Lactic acid 1.6.  Rapid COVID-19 antigen performed in the ED this evening was found to be positive.  Blood cultures x2 were collected.  Chest x-ray showed evidence of patchy left-sided airspace opacities, most prominent at the left base, but demonstrated no evidence of pneumothorax or pleural effusion.  EKG showed sinus rhythm with heart rate 70, T wave inversions in leads I, aVL, and V2 through V6, all of which were also seen on most recent prior EKG from 12/22/2018; additionally presenting EKG showed nonspecific less than 1 mm ST depression in leads V4 and V5, which was also unchanged relative to most recent prior EKG on 12/22/2018.  No evidence of ST elevation.  While in the ED, the following were administered:  No medications or IV fluids administered in the ED.  Subsequently, the patient was admitted to the COVID-19 ward at [facility] for further evaluation management of presenting COVID-19 pneumonia.      Review of Systems: As per HPI otherwise 10 point review of systems negative.   Past Medical History:  Diagnosis Date  . Ankylosing spondylitis ([place]) 11/07/2018  . Asthma   . BPH (benign prostatic hyperplasia)   . CAD (coronary artery disease) 01/1995  . Clostridium difficile colitis 04/2005  . Colitis 2011  . COPD (chronic obstructive pulmonary disease) ([place])   . Depression   . Diverticulosis   . DJD (degenerative joint disease)   . Gastric ulcer 04/17/10   Three 58m gastric ulcers, H.pylori serologies were negative  . GERD (gastroesophageal reflux disease)   . History of kidney stones   . Hyperlipidemia   . Hypertension   . Idiopathic chronic inflammatory bowel disease 05/18/2010   left-sided UC  . Kidney stone   . [place] 03/12[place]  . Obstructive sleep apnea    on Cpap  . Reflux 02/1995  . S/P endoscopy 07/24/10   retained gastric contents, benign bx    Past Surgical History:  Procedure Laterality Date  . ANORECTAL MANOMETRY  2016   [facility]: concern for possible fissure. Noted pelvic floor dyssnyergy  . BIOPSY  07/29/2017   Procedure: BIOPSY;  Surgeon: RDaneil Dolin MD;  Location: [facility] ENDO SUITE;  Service: Endoscopy;;  ascending and sigmoid colon  . CARDIAC CATHETERIZATION     with stent  . CARDIOVASCULAR STRESS TEST  07/21/2009   No scintigraphic evidence of inducible myocardial ischemia  . CARPAL TUNNEL RELEASE Left 01/17/2017   Procedure: LEFT CARPAL TUNNEL RELEASE;  Surgeon: HCarole Civil MD;  Location: [facility] ORS;  Service: Orthopedics;  Laterality: Left;  . CATARACT EXTRACTION, BILATERAL Bilateral   . CERVICAL SPINE SURGERY     C4-5  . COLONOSCOPY  04/2005   granularity and friability erosions from rectum to 40cm. Bx infection vs IBD. C. Diff positive at the time.   . COLONOSCOPY  05/2010   [facility]: left-sided UC, bx with no dysplasia, shallow diverticula  . COLONOSCOPY [name] 11/07/2012   RGUY:QIHK-VQQVZproctocolitis status post segmental  biopsy/Sigmoid colon polyps removed as described above. Procedure compromisd by technical difficulties. bx: Inflammation limited to sigmoid and rectum on pathology.  . COL[facility]COPY  04[facility]   [facility] proct[facility] limited to sigmoid  . COLONOSCOPY WITH PROPOFOL [name] 07/29/2017   diverticulosis in colon, three 4-6 mm polyps at splenic flexure and in cecum, one 10 mm polyp in rectum, abnormal rectum and sigmoid consistent with active UC  . CORONARY STENT PLACEMENT  01/1995  . ESOPHAGOGASTRODUODENOSCOPY  07/24/2010   RDGL:OVFIEPesophagus  . ESOPHAGOGASTRODUODENOSCOPY  04/2014   [facility]  . FOOT SURGERY Bilateral two  . KNEE SURGERY  two  . POLYPECTOMY  07/29/2017   Procedure: POLYPECTOMY;  Surgeon: RDaneil Dolin MD;  Location: [facility] ENDO SUITE;  Service: Endoscopy;;  splenic flexure, ascending colon polyp;rectal  . SHOULDER SURGERY  two  . TONSILLECTOMY    . TRANSTHORACIC ECHOCARDIOGRAM  03/23/2009   EF 60-65%, normal LV systolic function  . ULNAR HEAD EXCISION Left 01/17/2017   Procedure: LEFT ULNAR HEAD RESECTION;  Surgeon: HCarole Civil MD;  Location: [facility] ORS;  Service: Orthopedics;  Laterality: Left;    Social History:  reports that he quit smoking about 49 years ago.  His smoking use included cigarettes. He has a 20.00 pack-year smoking history. He has never used smokeless tobacco. He reports that he does not drink alcohol or use drugs.   No Known Allergies  Family History  Problem Relation Age of Onset  . Lung cancer Mother   . Cancer Mother        breast  . Diabetes Mother   . Stroke Father   . Hypertension Father   . Kidney failure Brother   . Other Child        blood infection  . Colon cancer Neg Hx      Prior to Admission medications   Medication Sig Start Date End Date Taking? Authorizing Provider  acetaminophen (TYLENOL) 325 MG tablet Take 650 mg by mouth every 6 (six) hours as needed for  moderate pain or headache.   Yes [provider]  albuterol (VENTOLIN HFA) 108 (90 Base) MCG/ACT inhaler Inhale 2 puffs into the lungs every 6 (six) hours as needed for wheezing or shortness of breath. 12/23/18  Yes Sheth, Devam P, MD  amLODipine (NORVASC) 2.5 MG tablet Take 1 tablet (2.5 mg total) by mouth every morning. 11/06/18  Yes [name], MD  aspirin 81 MG tablet Take 1 tablet (81 mg total) by mouth at bedtime. 03/18/18  Yes Patrecia Pour, MD  balsalazide (COLAZAL) 750 MG capsule TAKE 3 CAPSULES BY MOUTH THREE TIMES DAILY Patient taking differently: Take 1,500 mg by mouth in the morning and at bedtime.  03/09/19  Yes Erenest Rasher, PA-C  clonazePAM (KLONOPIN) 0.5 MG tablet Take 1/2-1 tablet po BID PRN agitation. Caution:drowsiness; use sparingly 12/23/18  Yes Luking, Elayne Snare, MD  doxazosin (CARDURA) 4 MG tablet Take one tablet by mouth at bedtime Patient taking differently: Take 4 mg by mouth at bedtime.  11/06/18  Yes [name], MD  furosemide (LASIX) 40 MG tablet Take 1 tablet (40 mg total) by mouth every morning. 12/23/18  Yes Sheth, Devam P, MD  losartan (COZAAR) 25 MG tablet TAKE 1 TABLET BY MOUTH ONCE DAILY IN THE MORNING Patient taking differently: Take 25 mg by mouth daily.  01/19/19  Yes [name], MD  metoprolol tartrate (LOPRESSOR) 50 MG tablet One po QHS Patient taking differently: Take 50 mg by mouth at bedtime.  11/06/18  Yes Luking, Elayne Snare, MD  nitroGLYCERIN (NITROSTAT) 0.4 MG SL tablet Place 0.4 mg under the tongue every 5 (five) minutes as needed for chest pain.   Yes [provider]  pravastatin (PRAVACHOL) 40 MG tablet Take 1 tablet (40 mg total) by mouth at bedtime. 11/24/18  Yes Lendon Colonel, NP  predniSONE (DELTASONE) 10 MG tablet Take 30 mg by mouth See admin instructions. 30 MG x6 days, 25 MG x7 day, 20 MG x7 days and continue to taper until complete   Yes [provider]  sertraline (ZOLOFT) 100 MG tablet Take one  tablet daily 12/24/18  Yes Luking, Elayne Snare, MD  Vitamin D, Ergocalciferol, (DRISDOL) 1.25 MG (50000 UNIT) CAPS capsule Take 50,000 Units by mouth every 7 (seven) days. mondays   Yes [provider]  mupirocin ointment (BACTROBAN) 2 % Apply thin layer to affected area once a day Patient not taking: Reported on 04/08/2019 11/06/18   [name], MD  predniSONE (DELTASONE) 20 MG tablet 2 tabs for 3 days, then 1 tab for 3 days and then stop Patient not taking: Reported on 04/08/2019 12/23/18   [name], MD  tiotropium (  SPIRIVA HANDIHALER) 18 MCG inhalation capsule One inhalation po daily Patient not taking: Reported on 04/08/2019 11/06/18   [name], MD  triamcinolone cream (KENALOG) 0.1 % Apply twice daily as needed for psoriasis. Patient not taking: Reported on 04/08/2019 04/22/18   [name], MD     Objective    Physical Exam: Vitals:   04/08/19 2200 04/08/19 2230 04/08/19 2300 04/08/19 2330  BP: (!) 126/58 (!) 126/59 130/64 123/60  Pulse: 68 67 65 61  Resp:   20 (!) 23  Temp:      TempSrc:      SpO2: 91% 92% 92% 96%  Weight:      Height:        General: appears to be stated age; alert, oriented Skin: warm, dry, no rash Head:  AT/[place] Eyes:  PEARL b/l, EOMI Mouth:  Oral mucosa membranes appear moist, normal dentition Neck: supple; trachea midline Heart:  RRR; did not appreciate any M/R/G Lungs: CTAB, did not appreciate any wheezes, rales, or rhonchi Abdomen: + BS; soft, ND, NT Vascular: 2+ pedal pulses b/l; 2+ radial pulses b/l Extremities: no peripheral edema, no muscle wasting  Labs on Admission: I have personally reviewed following labs and imaging studies  CBC: Recent Labs  Lab 04/08/19 2249  WBC 8.9  NEUTROABS 6.3  HGB 13.8  HCT 44.0  MCV 92.4  PLT 902*   Basic Metabolic Panel: Recent Labs  Lab 04/08/19 2249  NA 140  K 3.5  CL 104  CO2 26  GLUCOSE 142*  BUN 22  CREATININE 1.24  CALCIUM 8.7*   GFR: Estimated Creatinine  Clearance: 68.4 mL/min (by C-G formula based on SCr of 1.24 mg/dL). Liver Function Tests: Recent Labs  Lab 04/08/19 2249  AST 22  ALT 17  ALKPHOS 52  BILITOT 0.8  PROT 6.8  ALBUMIN 3.2*   No results for input(s): LIPASE, AMYLASE in the last 168 hours. No results for input(s): AMMONIA in the last 168 hours. Coagulation Profile: Recent Labs  Lab 04/08/19 2249  INR 1.1   Cardiac Enzymes: No results for input(s): CKTOTAL, CKMB, CKMBINDEX, TROPONINI in the last 168 hours. BNP (last 3 results) No results for input(s): PROBNP in the last 8760 hours. HbA1C: No results for input(s): HGBA1C in the last 72 hours. CBG: No results for input(s): GLUCAP in the last 168 hours. Lipid Profile: No results for input(s): CHOL, HDL, LDLCALC, TRIG, CHOLHDL, LDLDIRECT in the last 72 hours. Thyroid Function Tests: No results for input(s): TSH, T4TOTAL, FREET4, T3FREE, THYROIDAB in the last 72 hours. Anemia Panel: No results for input(s): VITAMINB12, FOLATE, FERRITIN, TIBC, IRON, RETICCTPCT in the last 72 hours. Urine analysis:    Component Value Date/Time   COLORURINE YELLOW 08/04/2014 1620   APPEARANCEUR CLEAR 08/04/2014 1620   LABSPEC 1.020 08/04/2014 1620   PHURINE 6.0 08/04/2014 1620   GLUCOSEU NEGATIVE 08/04/2014 1620   HGBUR SMALL (A) 08/04/2014 1620   BILIRUBINUR NEGATIVE 08/04/2014 1620   BILIRUBINUR ++ 11/18/2012 1319   KETONESUR NEGATIVE 08/04/2014 1620   PROTEINUR NEGATIVE 08/04/2014 1620   UROBILINOGEN 0.2 08/04/2014 1620   NITRITE NEGATIVE 08/04/2014 1620   LEUKOCYTESUR NEGATIVE 08/04/2014 1620    Radiological Exams on Admission: DG Chest Port 1 View  Result Date: 04/08/2019 CLINICAL DATA:  Cough EXAM: PORTABLE CHEST 1 VIEW COMPARISON:  12/21/2018 FINDINGS: Cardiac shadow is enlarged but stable. Aortic calcifications are again seen. The lungs are well aerated bilaterally. Patchy left basilar atelectasis is seen. No bony abnormality is seen. IMPRESSION:  Patchy left basilar  atelectasis. Electronically Signed   By: Inez Catalina M.D.   On: 04/08/2019 21:16    EKG: Independently reviewed, with result as described above.    Assessment/Plan   [name] is a 80 y.o. male with medical history significant for chronic hypoxic respiratory failure in the setting of severe COPD on 4 L continuous supplemental oxygen, hypertension, hyperlipidemia, who is admitted to [facility] on 04/08/2019 with COVID-19 pneumonia after presenting from home to [facility] Emergency Department complaining of shortness of breath.    Principal Problem:   Pneumonia due to COVID-19 virus Active Problems:   BPH (benign prostatic hyperplasia)   Essential hypertension   Shortness of breath   COPD (chronic obstructive pulmonary disease) ([place])    #) COVID-19 pneumonia: diagnosis on the basis of 1 to 2 days of shortness of breath associated with new onset nonproductive cough and subjective fever, positive rapid COVID-19 antigen performed in the ED this evening, with elevation of multiple general inflammatory markers, as further quantified above, and presenting CXR showing evidence of left sided patchy airspace opacities potentially consistent with COVID-19 pna. This in the context of daily exposure to his daughter, who was just diagnosed with COVID-19 two days ago.  In the context of chronic hypoxic respiratory failure on 4 L continuous O2 at home, presentation is not associate with any acute hypoxia relative to his baseline requirement.  Therefore, criteria are not met for patient's COVID-19 infection to be considered severe nature.  Consequently, criteria are not met for initiation of dexamethasone at this time.  However, in the setting of symptomatic COVID-19 pneumonia requiring hospitalization, criteria are met for initiation of remdesivir, with presenting nonelevated ALT of 17 offering no contraindication at the present time.  Of note negative presenting procalcitonin, in the  context of the pro inflammatory state associated with COVID-19 renders the possibility of bacterial pneumonia to be unlikely given the high negative predictive value of such finding in the setting.  This patient is at increased risk for a complicated clinical course relating to presenting COVID-19 infection on the basis of the following co-morbidities: Hypertension, obesity, and underlying severe COPD.  Of note, patient denies any known underlying history of diabetes.   Plan: admit to [facility], as above. Airborne and contact precautions with eye protection. Monitor continuous pulse oximetry. prn supplemental O2 to maintain O2 sats greater than or equal to 94%. Proning protocol initiated. monitor on telemetry. PRN albuterol inhaler.  In the setting of underlying severe COPD, I also ordered scheduled Combivent inhaler every 6 hours while awake. PRN acetaminophen for fever. Start remdesivir, as above. Can consider Tocilizumab if worsening hypoxemia and CRP > 10, with independent indication if patient subsequently requires intubation. Recheck inflammatory markers (fibrinogen, d dimer, crp, ferritin, LDH) in the morning. Will follow trend of d-dimer, which, if greater than 5 or if pt becomes critically ill, will warrant escalation of pharmacologic DVT prophylaxis from low-dose to intermediate-dose. Check serum magnesium and phosphorus levels. Check CMP and CBC in the morning. Check ABG for the purpose of evaluating PaO2 to FiO2 ratio.      #) Chronic hypoxic respiratory failure: In the setting of severe COPD, the patient reports baseline supplemental oxygen requirement in the form of 4 L continuous O2, without any current increase in oxygen demands relative to this baseline.  Patient does not appear to be in an overt acute COPD exacerbation at this time, although underlying COVID-19 pneumonia will certainly predispose him  for development of such.  Current outpatient respiratory regimen appears to  consist of as needed albuterol inhaler, as the patient reports that he has stopped taking his outpatient Spiriva.  The patient confirms that he is a former smoker, having completely quit smoking in the 1970s.  Plan: Close monitoring for development of acute COPD exacerbation, which may prompt initiation of Solu-Medrol.  Scheduled Combivent inhaler every 6 hours while awake, as above.  As needed albuterol inhaler.  Will check ABG, in part to establish baseline PaO2 to FiO2 ratio in the setting of presenting COVID-19 pneumonia, as above.  Monitor on continuous pulse oximetry.  Check serum phosphorus level.  Add on serum magnesium level.     #) Essential hypertension: Outpatient antihypertensive regimen includes Norvasc, losartan, Lopressor, with additional antihypertensive contributions from doxazosin and Lasix.  Presenting systolic blood pressures found to be in the 120s to 140s mmHg. in the setting of infectious presentation, will proceed conservatively with outpatient antihypertensive regimen for now, as further described below.  Plan: Hold home Norvasc and losartan, as above.  Will continue home Lopressor as well as doxazosin and Lasix, with close monitoring of ensuing blood pressures via routine vital signs.     #) Hyperlipidemia: on pravastatin as an outpatient.   Plan: continue home statin.      #) Depression: On Zoloft at home.  Plan: Continue home SSRI.     #) Benign prostatic hyperplasia: On doxazosin as an outpatient.  Plan: Continue home doxazosin, monitor strict I's and O's and daily weights.  Repeat BMP in the morning.   DVT prophylaxis: Lovenox 40 mg subcu daily Code Status: Full code Family Communication: none Disposition Plan: Per Rounding Team Consults called: none  Admission status: Inpatient; med telemetry at [facility].    PLEASE NOTE THAT DRAGON DICTATION SOFTWARE WAS USED IN THE CONSTRUCTION OF THIS NOTE.   [facility] Pager  [phone] From [place].   Otherwise, please contact night-coverage  [email or web address] Password [email or web address]  04/09/2019, 12:08 AM

## 2019-04-09 NOTE — Progress Notes (Signed)
Rx Brief note: Lovenox  Wt = 134 kg, BMI = 39, CrCl~68 ml/min  Rx adjusted Lovenox to 60 mg daily in pt with BMI>30  Thanks Dorrene German 04/09/2019 1:47 AM

## 2019-04-09 NOTE — Progress Notes (Addendum)
[facility]  PROGRESS NOTE  [name] [id number]:[id number] DOB: 1940-12-12 DOA: 04/08/2019 PCP: [name], MD Admit date - 04/08/2019   Admitting Physician Rhetta Mura, DO  Outpatient Primary MD for the patient is [name], MD  LOS - 0 Brief Narrative   [name] is a 79 y.o. 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  He lives with his daughter who was diagnosed with COVID on 2/27 about a week ago who presented on 04/08/2019 with days of congestion, headache, cough productive of green sputum, and fever.  Of note patient did receive first dose of Covid vaccine on 03/21/2019.  In the ED he had a T-max 99.9, tachypneic with respiratory rate of 23, stable O2 saturation of 93-96% on 4 L.  Lab work notable for positive Covid test, BNP unremarkable, platelets 125, D-dimer 1.66, procalcitonin 0.10, ferritin 411, CRP 6.7.  Influenza panel negative, lactic acid 1.6.  Chest x-ray showed patchy left basilar atelectasis.  Patient was admitted with working diagnosis of COVID-19 pneumonia started on IV remdesivir.     Subjective  Today he feels better than yesterday. States his main concern prior to admission was profound weakness where he wasn't able to get out of bed which prompted family to call ED. Denies any SOB, cough, changes in O2 requirements.  He reports he was started on antibiotics for diverticulitis recently but endorses no abdominal pain now. Last  BM was Monday before admission  A & P  Covid -19 pneumonia, stable Patient presented with cough, SOB, in setting of Covid exposure from family, chest x-ray showing patchy airspace disease consistent with COVID-19 pneumonia.  Patient has chronic hypoxia on 4 L with no increase in oxygen needs.  His CRP has worsened slightly to greater than 7, he is at  increased risk for complicated clinical course related to his comorbidities including chronic immunosuppression (on prednisone for ulcerative proctosigmoiditis), HTN, obesity, severe COPD/chronic O2 requirements. -Continue IV remdesivir -Start IV Solumedrol -If any increase in O2 requirements, will add Actemra -Daily CBC with differential, CRP, ferritin, D-dimer   Chronic hypoxic respiratory failure secondary to severe COPD, stable Patient declined ABG (tried to stick once unsuccessful).  Mentating well, no increase in O2 requirements, no acute exacerbation.  He is no longer taking his outpatient Spiriva -Scheduled albuterol twice daily, Atrovent twice daily -Aggressive pulmonary regimen: Flutter valve, incentive spirometry, out of bed to chair/mobilization  HTN, elevated SBP in 170s this am, initially held amlodipine and home losartan in setting of infectious presentation on admission and normotensive BP range, SBP's now ranging 170s this morning -resume home amlodipine -Continue home Lopressor, doxazosin and Lasix -Blood pressure continues to worsen will also resume home losartan, as needed IV hydralazine as needed  Obesity BMI 37  Chronic ulcerative proctosigmoiditis with chronic diarrhea Followed by [facility]. Has had diarrhea for years with minimal improvement on prednisone taper started on 12/20.  Daughter confirms patient was currently on prednisone 30 mg prior to admission -We will transition to IV Solumedrol, -When able to go back to oral resume prednisone 30 mg taper  BPH, stable -Monitor output, continue home doxazosin  HLD, stable -On pravastatin  CAD, stable S/p stent 1996  Depression, stable -Home Zoloft      Family Communication  : Spoke with daughter [name] at [phone] on 3/4 and updated her on the plan (  she confirms the patient was on prednisone taper)  Code Status : Full code  Disposition Plan  :  Patient is from home. Anticipated d/c date:  5  Days. Barriers to d/c or necessity for inpatient status:  Needs to complete 5-day course of IV remdesivir, also adding IV Solumedrol given chronic immunosuppressive state, needs close monitoring of serial inflammatory markers, high threshold for decompensation given comorbidities, and at increased risk of adrenal insufficiency in setting of illness. Consults  : None  Procedures  :  none  DVT Prophylaxis  :  Lovenox   Lab Results  Component Value Date   PLT 114 (L) 04/09/2019    Diet :  Diet Order            Diet regular Room service appropriate? Yes; Fluid consistency: Thin  Diet effective now               Inpatient Medications Scheduled Meds: . albuterol  2 puff Inhalation BID  . [START ON 04/10/2019] aspirin EC  81 mg Oral QHS  . [START ON 04/10/2019] balsalazide  1,500 mg Oral BID  . [START ON 04/10/2019] doxazosin  4 mg Oral QHS  . [START ON 04/10/2019] enoxaparin (LOVENOX) injection  60 mg Subcutaneous Daily  . furosemide  40 mg Oral q morning - 10a  . ipratropium  2 puff Inhalation BID  . [START ON 04/10/2019] metoprolol tartrate  50 mg Oral QHS  . [START ON 04/10/2019] pravastatin  40 mg Oral QHS  . sertraline  100 mg Oral Daily  . sodium chloride flush  3 mL Intravenous Q12H   Continuous Infusions: . [START ON 04/10/2019] remdesivir 100 mg in NS 100 mL     PRN Meds:.acetaminophen, albuterol  Antibiotics  :   Anti-infectives (From admission, onward)   Start     Dose/Rate Route Frequency Ordered Stop   04/10/19 1000  remdesivir 100 mg in sodium chloride 0.9 % 100 mL IVPB     100 mg 200 mL/hr over 30 Minutes Intravenous Daily 04/09/19 0200 04/14/19 0959   04/09/19 0200  remdesivir 100 mg in sodium chloride 0.9 % 100 mL IVPB     100 mg 200 mL/hr over 30 Minutes Intravenous Every 1 hr x 2 04/09/19 0159 04/09/19 0359   04/08/19 2359  azithromycin (ZITHROMAX) 500 mg in sodium chloride 0.9 % 250 mL IVPB  Status:  Discontinued     500 mg 250 mL/hr over 60 Minutes Intravenous  Every 24 hours 04/08/19 2302 04/08/19 2329   04/08/19 2315  cefTRIAXone (ROCEPHIN) 2 g in sodium chloride 0.9 % 100 mL IVPB  Status:  Discontinued     2 g 200 mL/hr over 30 Minutes Intravenous Every 24 hours 04/08/19 2302 04/08/19 2329       Objective   Vitals:   04/09/19 0300 04/09/19 0400 04/09/19 0443 04/09/19 0748  BP:  (!) 176/64    Pulse: 67 63    Resp: (!) 22   17  Temp:  98.9 F (37.2 C)    TempSrc:  Oral    SpO2: 96% 96% 93% 93%  Weight:  130.8 kg    Height:  6' 2"  (1.88 m)      SpO2: 93 % O2 Flow Rate (L/min): 4 L/min(Home O2 setting)  Wt Readings from Last 3 Encounters:  04/09/19 130.8 kg  12/23/18 121.3 kg  11/24/18 133.2 kg     Intake/Output Summary (Last 24 hours) at 04/09/2019 0838 Last data filed at 04/09/2019 0430 Gross per  24 hour  Intake 220 ml  Output --  Net 220 ml    Physical Exam:     Awake Alert, Oriented X person, place, time, context, Normal affect No new F.N deficits,  [place].AT, Normal respiratory effort on 4 L ( home regimen), CTAB RRR,No Gallops,Rubs or new Murmurs,  +ve B.Sounds, Abd Soft, No tenderness with deep palpation, No rebound, guarding or rigidity. No Cyanosis, No new Rash or bruise     I have personally reviewed the following:   Data Reviewed:  CBC Recent Labs  Lab 04/08/19 2249 04/09/19 0523  WBC 8.9 6.4  HGB 13.8 13.9  HCT 44.0 44.2  PLT 125* 114*  MCV 92.4 93.2  MCH 29.0 29.3  MCHC 31.4 31.4  RDW 14.6 14.6  LYMPHSABS 1.4 1.2  MONOABS 1.2* 0.7  EOSABS 0.0 0.0  BASOSABS 0.0 0.0    Chemistries  Recent Labs  Lab 04/08/19 2249 04/09/19 0149 04/09/19 0523  NA 140  --  141  K 3.5  --  3.5  CL 104  --  103  CO2 26  --  29  GLUCOSE 142*  --  159*  BUN 22  --  21  CREATININE 1.24  --  1.16  CALCIUM 8.7*  --  8.9  MG  --  2.1 2.0  AST 22  --  24  ALT 17  --  17  ALKPHOS 52  --  48  BILITOT 0.8  --  0.8    ------------------------------------------------------------------------------------------------------------------ Recent Labs    04/08/19 2249  TRIG 107    Lab Results  Component Value Date   HGBA1C 5.9 (H) 12/22/2018   ------------------------------------------------------------------------------------------------------------------ No results for input(s): TSH, T4TOTAL, T3FREE, THYROIDAB in the last 72 hours.  Invalid input(s): FREET3 ------------------------------------------------------------------------------------------------------------------ Recent Labs    04/08/19 2249 04/09/19 0524  FERRITIN 411* 461*    Coagulation profile Recent Labs  Lab 04/08/19 2249  INR 1.1    Recent Labs    04/08/19 2249 04/09/19 0523  DDIMER 1.66* 1.56*    Cardiac Enzymes No results for input(s): CKMB, TROPONINI, MYOGLOBIN in the last 168 hours.  Invalid input(s): CK ------------------------------------------------------------------------------------------------------------------    Component Value Date/Time   BNP 99.5 12/21/2018 1853    Micro Results No results found for this or any previous visit (from the past 240 hour(s)).  Radiology Reports DG Chest Port 1 View  Result Date: 04/08/2019 CLINICAL DATA:  Cough EXAM: PORTABLE CHEST 1 VIEW COMPARISON:  12/21/2018 FINDINGS: Cardiac shadow is enlarged but stable. Aortic calcifications are again seen. The lungs are well aerated bilaterally. Patchy left basilar atelectasis is seen. No bony abnormality is seen. IMPRESSION: Patchy left basilar atelectasis. Electronically Signed   By: Inez Catalina M.D.   On: 04/08/2019 21:16     Time Spent in minutes  30     Desiree Hane M.D on 04/09/2019 at 8:38 AM  To page go to [email or web address] - password [facility]

## 2019-04-09 NOTE — Telephone Encounter (Signed)
Ok, forward to dr scott so he will know when returns

## 2019-04-09 NOTE — Telephone Encounter (Signed)
Patient need up developing SOB yesterday and went to ER and had rapid test that was positive and admitted to hospital with Covid pneumonia.

## 2019-04-10 ENCOUNTER — Inpatient Hospital Stay (HOSPITAL_COMMUNITY): Payer: Medicare Other

## 2019-04-10 DIAGNOSIS — I10 Essential (primary) hypertension: Secondary | ICD-10-CM

## 2019-04-10 DIAGNOSIS — R0602 Shortness of breath: Secondary | ICD-10-CM

## 2019-04-10 DIAGNOSIS — J9621 Acute and chronic respiratory failure with hypoxia: Secondary | ICD-10-CM

## 2019-04-10 DIAGNOSIS — K5130 Ulcerative (chronic) rectosigmoiditis without complications: Secondary | ICD-10-CM

## 2019-04-10 DIAGNOSIS — J449 Chronic obstructive pulmonary disease, unspecified: Secondary | ICD-10-CM

## 2019-04-10 LAB — HEPATIC FUNCTION PANEL
ALT: 19 U/L (ref 0–44)
AST: 29 U/L (ref 15–41)
Albumin: 3.2 g/dL — ABNORMAL LOW (ref 3.5–5.0)
Alkaline Phosphatase: 53 U/L (ref 38–126)
Bilirubin, Direct: 0.1 mg/dL (ref 0.0–0.2)
Indirect Bilirubin: 0.3 mg/dL (ref 0.3–0.9)
Total Bilirubin: 0.4 mg/dL (ref 0.3–1.2)
Total Protein: 6.8 g/dL (ref 6.5–8.1)

## 2019-04-10 LAB — GLUCOSE, CAPILLARY: Glucose-Capillary: 138 mg/dL — ABNORMAL HIGH (ref 70–99)

## 2019-04-10 LAB — CBC WITH DIFFERENTIAL/PLATELET
Abs Immature Granulocytes: 0.03 10*3/uL (ref 0.00–0.07)
Basophils Absolute: 0 10*3/uL (ref 0.0–0.1)
Basophils Relative: 0 %
Eosinophils Absolute: 0 10*3/uL (ref 0.0–0.5)
Eosinophils Relative: 0 %
HCT: 47.3 % (ref 39.0–52.0)
Hemoglobin: 15 g/dL (ref 13.0–17.0)
Immature Granulocytes: 1 %
Lymphocytes Relative: 22 %
Lymphs Abs: 1.3 10*3/uL (ref 0.7–4.0)
MCH: 29.2 pg (ref 26.0–34.0)
MCHC: 31.7 g/dL (ref 30.0–36.0)
MCV: 92 fL (ref 80.0–100.0)
Monocytes Absolute: 0.2 10*3/uL (ref 0.1–1.0)
Monocytes Relative: 4 %
Neutro Abs: 4.4 10*3/uL (ref 1.7–7.7)
Neutrophils Relative %: 73 %
Platelets: 126 10*3/uL — ABNORMAL LOW (ref 150–400)
RBC: 5.14 MIL/uL (ref 4.22–5.81)
RDW: 14.1 % (ref 11.5–15.5)
WBC: 6 10*3/uL (ref 4.0–10.5)
nRBC: 0 % (ref 0.0–0.2)

## 2019-04-10 LAB — BASIC METABOLIC PANEL
Anion gap: 11 (ref 5–15)
BUN: 30 mg/dL — ABNORMAL HIGH (ref 8–23)
CO2: 28 mmol/L (ref 22–32)
Calcium: 9.2 mg/dL (ref 8.9–10.3)
Chloride: 103 mmol/L (ref 98–111)
Creatinine, Ser: 1.18 mg/dL (ref 0.61–1.24)
GFR calc Af Amer: >60 mL/min (ref >60)
GFR calc non Af Amer: 58 mL/min — ABNORMAL LOW (ref >60)
Glucose, Bld: 200 mg/dL — ABNORMAL HIGH (ref 70–99)
Potassium: 3.9 mmol/L (ref 3.5–5.1)
Sodium: 142 mmol/L (ref 135–145)

## 2019-04-10 LAB — URINE CULTURE

## 2019-04-10 LAB — LACTATE DEHYDROGENASE: LDH: 152 U/L (ref 98–192)

## 2019-04-10 LAB — FERRITIN: Ferritin: 774 ng/mL — ABNORMAL HIGH (ref 24–336)

## 2019-04-10 LAB — D-DIMER, QUANTITATIVE: D-Dimer, Quant: 2.08 ug/mL-FEU — ABNORMAL HIGH (ref 0.00–0.50)

## 2019-04-10 LAB — C-REACTIVE PROTEIN: CRP: 6.6 mg/dL — ABNORMAL HIGH (ref <1.0)

## 2019-04-10 MED ORDER — IPRATROPIUM-ALBUTEROL 0.5-2.5 (3) MG/3ML IN SOLN: 3.0000 mL | Freq: Four times a day (QID) | RESPIRATORY_TRACT | Status: DC

## 2019-04-10 MED ORDER — LOSARTAN POTASSIUM 25 MG PO TABS
25.0000 mg | ORAL_TABLET | Freq: Every day | ORAL | Status: DC
  Administered 2019-04-10 – 2019-04-13 (×4): 25 mg via ORAL
  Filled 2019-04-10 (×4): qty 1

## 2019-04-10 MED ORDER — BARICITINIB 2 MG PO TABS
4.0000 mg | ORAL_TABLET | Freq: Every day | ORAL | Status: DC
  Administered 2019-04-10 – 2019-04-12 (×3): 4 mg via ORAL
  Filled 2019-04-10 (×4): qty 2

## 2019-04-10 MED ORDER — BENZONATATE 100 MG PO CAPS
200.0000 mg | ORAL_CAPSULE | Freq: Two times a day (BID) | ORAL | Status: DC | PRN
  Administered 2019-04-10: 200 mg via ORAL
  Filled 2019-04-10: qty 2

## 2019-04-10 NOTE — Progress Notes (Signed)
[facility]  PROGRESS NOTE  [name] YTW:[id number] DOB: 1940/06/25 DOA: 04/08/2019 PCP: [name], MD Admit date - 04/08/2019   Admitting Physician Rhetta Mura, DO  Outpatient Primary MD for the patient is [name], MD  LOS - 1 Brief Narrative   [name] is a 79 y.o. 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  He lives with his daughter who was diagnosed with COVID on 2/27 about a week ago who presented on 04/08/2019 with days of congestion, headache, cough productive of green sputum, and fever.  Of note patient did receive first dose of Covid vaccine on 03/21/2019.  In the ED he had a T-max 99.9, tachypneic with respiratory rate of 23, stable O2 saturation of 93-96% on 4 L.  Lab work notable for positive Covid test, BNP unremarkable, platelets 125, D-dimer 1.66, procalcitonin 0.10, ferritin 411, CRP 6.7.  Influenza panel negative, lactic acid 1.6.  Chest x-ray showed patchy left basilar atelectasis.  Patient was admitted with working diagnosis of COVID-19 pneumonia started on IV remdesivir. Hospital course complicated by increasing oxygen requirement to 7 L high flow on 3/5.      Subjective  Today he feels little more short of breath.  Was unable to get CPAP last night due to Covid infection.  Denies any chest pain.  No fevers or chills.  A & P  Covid -19 pneumonia, seems to be worsening with increased O2 requirements Patient presented with cough, SOB, in setting of Covid exposure from family, chest x-ray showing patchy airspace disease consistent with COVID-19 pneumonia.  Patient has chronic hypoxia on 4 L but now requiring 7L high flow to maintain SPO2 88% and patient has increased work of breathing changed in 24 hours prior. He is not tachypneic or proning I don't believe he warrants  change in level of care but he has low threshold for transfer. His CRP is 6.7 but this could be falsely low in the setting of chronic immune suppression therapy ( chronic prednisone as outpatient) despite being on IV remdesivir and IV Solu-Medrol. -Given hypoxia, persistently elevated inflammatory markers, worsening clinically will add Baricitinib( shorter acting, avoiding longer acting Actemra to avoid increased risk of infection in the setting of chronic outpatient prednisone therapy)  -Repeat chest x-ray, to ensure no other pulm pathology -Aggressive pulmonary regimen: Incentive spirometry, flutter valve -Daily CBC with differential, CRP, ferritin, D-dimer   Acute on Chronic hypoxic respiratory failure secondary to severe COPD, stable Patient declined ABG (tried to stick once unsuccessful) on admission.  Mentating well but overnight increase in O2 requirements, now on 7 L HFNC.  No overt wheezing on exam, slight increase work of breathing -sdchedule duo nebs -Aggressive pulmonary regimen: Flutter valve, incentive spirometry, out of bed to chair/mobilization  HTN, elevated but improving SBP range 140s-170s, initially held amlodipine and home losartan in setting of infectious presentation on admission and normotensive BP range,  -Continue home amlodipine -Continue home Lopressor, doxazosin and Lasix -Resume home losartan, as needed IV hydralazine as needed  Obesity BMI 37  Chronic ulcerative proctosigmoiditis with chronic diarrhea Followed by [facility]. Has had diarrhea for years with minimal improvement on prednisone taper started on 12/20.  Daughter confirms patient was currently on prednisone 30 mg prior to admission - IV Solumedrol for above -When able to go back to oral resume prednisone 30 mg taper  BPH, stable -Monitor output, continue home doxazosin  HLD, stable -On pravastatin  CAD, stable S/p stent 1996  Depression, stable -Home Zoloft      Family Communication  :  called daughter [name] at [phone] on 3/5, no answer, left a message   Code Status : Full code  Disposition Plan  :  Patient is from home. Anticipated d/c date:  2 to 3 days. Barriers to d/c or necessity for inpatient status:  Worsening O2 requirement now needs Baricitinib in addition to IV remdesivir and Decadron, needs close monitoring given escalating O2 needs, will need to wean back to home regimen before able to discharge  consults  : None  Procedures  :  none  DVT Prophylaxis  :  Lovenox   Lab Results  Component Value Date   PLT 114 (L) 04/09/2019    Diet :  Diet Order            Diet regular Room service appropriate? Yes; Fluid consistency: Thin  Diet effective now               Inpatient Medications Scheduled Meds: . amLODipine  2.5 mg Oral q morning - 10a  . aspirin EC  81 mg Oral QHS  . balsalazide  1,500 mg Oral BID  . doxazosin  4 mg Oral QHS  . enoxaparin (LOVENOX) injection  60 mg Subcutaneous Daily  . furosemide  40 mg Oral q morning - 10a  . ipratropium  2 puff Inhalation BID  . methylPREDNISolone (SOLU-MEDROL) injection  1 mg/kg/day Intravenous Q12H  . metoprolol tartrate  50 mg Oral QHS  . pravastatin  40 mg Oral QHS  . sertraline  100 mg Oral Daily  . sodium chloride flush  3 mL Intravenous Q12H   Continuous Infusions: . remdesivir 100 mg in NS 100 mL 100 mg (04/10/19 0936)   PRN Meds:.acetaminophen, albuterol, hydrALAZINE, liver oil-zinc oxide  Antibiotics  :   Anti-infectives (From admission, onward)   Start     Dose/Rate Route Frequency Ordered Stop   04/10/19 1000  remdesivir 100 mg in sodium chloride 0.9 % 100 mL IVPB     100 mg 200 mL/hr over 30 Minutes Intravenous Daily 04/09/19 0200 04/14/19 0959   04/09/19 1115  remdesivir 100 mg in sodium chloride 0.9 % 100 mL IVPB     100 mg 200 mL/hr over 30 Minutes Intravenous NOW 04/09/19 1027 04/09/19 1130   04/09/19 0200  remdesivir 100 mg in sodium chloride 0.9 % 100 mL IVPB      100 mg 200 mL/hr over 30 Minutes Intravenous Every 1 hr x 2 04/09/19 0159 04/09/19 0359   04/08/19 2359  azithromycin (ZITHROMAX) 500 mg in sodium chloride 0.9 % 250 mL IVPB  Status:  Discontinued     500 mg 250 mL/hr over 60 Minutes Intravenous Every 24 hours 04/08/19 2302 04/08/19 2329   04/08/19 2315  cefTRIAXone (ROCEPHIN) 2 g in sodium chloride 0.9 % 100 mL IVPB  Status:  Discontinued     2 g 200 mL/hr over 30 Minutes Intravenous Every 24 hours 04/08/19 2302 04/08/19 2329       Objective   Vitals:   04/10/19 0500 04/10/19 0607 04/10/19 0633 04/10/19 0929  BP:  (!) 172/81  (!) 151/63  Pulse:  (!) 59    Resp:  (!) 22    Temp:  97.8 F (36.6 C)    TempSrc:  Oral    SpO2:  (!) 88% 93%   Weight:  129.3 kg     Height:        SpO2: 93 % O2 Flow Rate (L/min): 7 L/min  Wt Readings from Last 3 Encounters:  04/10/19 129.3 kg  12/23/18 121.3 kg  11/24/18 133.2 kg     Intake/Output Summary (Last 24 hours) at 04/10/2019 0953 Last data filed at 04/10/2019 1638 Gross per 24 hour  Intake 100 ml  Output 350 ml  Net -250 ml    Physical Exam:     Resting in bed comfortably Awake Alert, Oriented X person, place, time, context, Normal affect No new F.N deficits,  [place].AT, Slight increased work of breathing on 7 L high flow nasal cannula, no overt wheezing, able to speak in complete sentences, some abdominal breathing but otherwise no accessory muscle use RRR,No Gallops,Rubs or new Murmurs,  +ve B.Sounds, Abd Soft, No tenderness with deep palpation, No rebound, guarding or rigidity. No Cyanosis, No new Rash or bruise     I have personally reviewed the following:   Data Reviewed:  CBC Recent Labs  Lab 04/08/19 2249 04/09/19 0523  WBC 8.9 6.4  HGB 13.8 13.9  HCT 44.0 44.2  PLT 125* 114*  MCV 92.4 93.2  MCH 29.0 29.3  MCHC 31.4 31.4  RDW 14.6 14.6  LYMPHSABS 1.4 1.2  MONOABS 1.2* 0.7  EOSABS 0.0 0.0  BASOSABS 0.0 0.0    Chemistries  Recent Labs  Lab  04/08/19 2249 04/09/19 0149 04/09/19 0523  NA 140  --  141  K 3.5  --  3.5  CL 104  --  103  CO2 26  --  29  GLUCOSE 142*  --  159*  BUN 22  --  21  CREATININE 1.24  --  1.16  CALCIUM 8.7*  --  8.9  MG  --  2.1 2.0  AST 22  --  24  ALT 17  --  17  ALKPHOS 52  --  48  BILITOT 0.8  --  0.8   ------------------------------------------------------------------------------------------------------------------ Recent Labs    04/08/19 2249  TRIG 107    Lab Results  Component Value Date   HGBA1C 5.9 (H) 12/22/2018   ------------------------------------------------------------------------------------------------------------------ No results for input(s): TSH, T4TOTAL, T3FREE, THYROIDAB in the last 72 hours.  Invalid input(s): FREET3 ------------------------------------------------------------------------------------------------------------------ Recent Labs    04/08/19 2249 04/09/19 0524  FERRITIN 411* 461*    Coagulation profile Recent Labs  Lab 04/08/19 2249  INR 1.1    Recent Labs    04/08/19 2249 04/09/19 0523  DDIMER 1.66* 1.56*    Cardiac Enzymes No results for input(s): CKMB, TROPONINI, MYOGLOBIN in the last 168 hours.  Invalid input(s): CK ------------------------------------------------------------------------------------------------------------------    Component Value Date/Time   BNP 99.5 12/21/2018 1853    Micro Results Recent Results (from the past 240 hour(s))  Urine culture     Status: Abnormal   Collection Time: 04/09/19  3:19 AM   Specimen: In/Out Cath Urine  Result Value Ref Range Status   Specimen Description   Final    IN/OUT CATH URINE Performed at [facility], [place] [address], [place], [place] [address]    Special Requests   Final    NONE Performed at [facility], [place] [address], [place], [place] [address]    Culture MULTIPLE SPECIES PRESENT, SUGGEST RECOLLECTION (A)  Final   Report  Status 04/10/2019 FINAL  Final    Radiology Reports DG Chest Port 1 View  Result Date: 04/08/2019 CLINICAL DATA:  Cough EXAM: PORTABLE  CHEST 1 VIEW COMPARISON:  12/21/2018 FINDINGS: Cardiac shadow is enlarged but stable. Aortic calcifications are again seen. The lungs are well aerated bilaterally. Patchy left basilar atelectasis is seen. No bony abnormality is seen. IMPRESSION: Patchy left basilar atelectasis. Electronically Signed   By: Inez Catalina M.D.   On: 04/08/2019 21:16     Time Spent in minutes  30     Desiree Hane M.D on 04/10/2019 at 9:53 AM  To page go to [email or web address] - password [facility]

## 2019-04-10 NOTE — Progress Notes (Signed)
Pt is incontinent of urine, so OP is not accurate.

## 2019-04-10 NOTE — Progress Notes (Signed)
Patient refused lab draw this AM. Education provided. Verbalized understanding. Phlebotomist notified of patient compliance with plan of care. Tech to come draw specimen shortly. MD at bedside and aware.

## 2019-04-10 NOTE — TOC Progression Note (Signed)
Transition of Care [facility]) - Progression Note    Patient Details  Name: RASHAAD HALLSTROM MRN: [id number] Date of Birth: Feb 20, 1940  [facility] CM/SW Contact  Purcell Mouton, RN Phone Number: 04/10/2019, 12:45 PM  Clinical Narrative:    TOC will continue to follow pt for discharge needs. Pt is COVID+ 04/08/19.   Expected Discharge Plan: Home/Self Care Barriers to Discharge: No Barriers Identified  Expected Discharge Plan and Services Expected Discharge Plan: Home/Self Care       Living arrangements for the past 2 months: Single Family Home                                       Social Determinants of Health (SDOH) Interventions    Readmission Risk Interventions No flowsheet data found.

## 2019-04-11 DIAGNOSIS — U071 COVID-19: Principal | ICD-10-CM

## 2019-04-11 DIAGNOSIS — J1282 Pneumonia due to coronavirus disease 2019: Secondary | ICD-10-CM

## 2019-04-11 DIAGNOSIS — J9611 Chronic respiratory failure with hypoxia: Secondary | ICD-10-CM

## 2019-04-11 DIAGNOSIS — E669 Obesity, unspecified: Secondary | ICD-10-CM

## 2019-04-11 LAB — CBC WITH DIFFERENTIAL/PLATELET
Abs Immature Granulocytes: 0.02 10*3/uL (ref 0.00–0.07)
Basophils Absolute: 0 10*3/uL (ref 0.0–0.1)
Basophils Relative: 0 %
Eosinophils Absolute: 0 10*3/uL (ref 0.0–0.5)
Eosinophils Relative: 0 %
HCT: 45 % (ref 39.0–52.0)
Hemoglobin: 14 g/dL (ref 13.0–17.0)
Immature Granulocytes: 0 %
Lymphocytes Relative: 29 %
Lymphs Abs: 1.7 10*3/uL (ref 0.7–4.0)
MCH: 28.7 pg (ref 26.0–34.0)
MCHC: 31.1 g/dL (ref 30.0–36.0)
MCV: 92.2 fL (ref 80.0–100.0)
Monocytes Absolute: 0.3 10*3/uL (ref 0.1–1.0)
Monocytes Relative: 6 %
Neutro Abs: 3.8 10*3/uL (ref 1.7–7.7)
Neutrophils Relative %: 65 %
Platelets: 141 10*3/uL — ABNORMAL LOW (ref 150–400)
RBC: 4.88 MIL/uL (ref 4.22–5.81)
RDW: 14 % (ref 11.5–15.5)
WBC: 5.8 10*3/uL (ref 4.0–10.5)
nRBC: 0 % (ref 0.0–0.2)

## 2019-04-11 LAB — D-DIMER, QUANTITATIVE: D-Dimer, Quant: 1.89 ug/mL-FEU — ABNORMAL HIGH (ref 0.00–0.50)

## 2019-04-11 LAB — C-REACTIVE PROTEIN: CRP: 2.4 mg/dL — ABNORMAL HIGH (ref <1.0)

## 2019-04-11 MED ORDER — PHENYLEPH-SHARK LIV OIL-MO-PET 0.25-3-14-71.9 % RE OINT
TOPICAL_OINTMENT | Freq: Two times a day (BID) | RECTAL | Status: DC | PRN
  Filled 2019-04-11 (×2): qty 28.4

## 2019-04-11 NOTE — Progress Notes (Signed)
[facility]  PROGRESS NOTE  MAYO FAULK HUT:[id number] DOB: 1940/08/14 DOA: 04/08/2019 PCP: [name], MD Admit date - 04/08/2019   Admitting Physician Rhetta Mura, DO  Outpatient Primary MD for the patient is [name], MD  LOS - 2 Brief Narrative   [name] is a 79 y.o. 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  He lives with his daughter who was diagnosed with COVID on 2/27 about a week ago who presented on 04/08/2019 with days of congestion, headache, cough productive of green sputum, and fever.  Of note patient did receive first dose of Covid vaccine on 03/21/2019.  In the ED he had a T-max 99.9, tachypneic with respiratory rate of 23, stable O2 saturation of 93-96% on 4 L.  Lab work notable for positive Covid test, BNP unremarkable, platelets 125, D-dimer 1.66, procalcitonin 0.10, ferritin 411, CRP 6.7.  Influenza panel negative, lactic acid 1.6.  Chest x-ray showed patchy left basilar atelectasis.  Patient was admitted with working diagnosis of COVID-19 pneumonia started on IV remdesivir (3/4). Hospital course complicated by increasing oxygen requirement to 7 L high flow on 3/5. Given hypoxia, persistently elevated inflammatory markers, worsening clinically  Baricitinib was added( shorter acting, avoiding longer acting Actemra to avoid increased risk of infection in the setting of chronic outpatient prednisone therapy)       Subjective  Today he feels much better. Sitting in bedside chair. Mild cough  A & P  Acute on chronic hypoxic respiratory failure secondary to Covid -19 pneumonia in patient with severe COPD, slightly improving CXR shows atelectasis, Patient requiring a little less O2 at 6 L and maintaining SpO2 around 89-90. His goal is > 88% given COPD. He has no tachypnea or  increased WOB so this is improved from yesterday after initiation of baritinib. His home O2 is 4 L [place].   His CRP is 6.7 but this could be falsely low in the setting of chronic immune suppression therapy ( chronic prednisone as outpatient) despite being on IV remdesivir and IV Solu-Medrol. -Continue baritinib as symptoms improve  -Repeat chest x-ray, to ensure no other pulm pathology -Aggressive pulmonary regimen: Incentive spirometry, flutter valve -Daily CBC with differential, CRP, ferritin, D-dimer  -decrease frequency of duo -nebs, no wheezing on exam -Aggressive pulmonary regimen: Flutter valve, incentive spirometry, out of bed to chair/mobilization  HTN, elevated but improving SBP range 150s-160s, initially held amlodipine and home losartan in setting of infectious presentation on admission and normotensive BP range,  -Continue home amlodipine -Continue home Lopressor, doxazosin and Lasix -Continue home losartan, as needed IV hydralazine as needed  Obesity BMI 37  Chronic ulcerative proctosigmoiditis with chronic diarrhea Followed by [facility]. Has had diarrhea for years with minimal improvement on prednisone taper started on 12/20.  Daughter confirms patient was currently on prednisone 30 mg prior to admission - IV Solumedrol for above -When able to go back to oral resume prednisone 30 mg taper  BPH, stable -Monitor output, continue home doxazosin  HLD, stable -On pravastatin  CAD, stable S/p stent 1996  Depression, stable -Home Zoloft      Family Communication  : called daughter Kyng Matlock at [phone] on 3/5, no answer, left a message , will call again on 3/6  Code Status : Full code  Disposition Plan  :  Patient is from home. Anticipated d/c date:  2 to 3 days. Barriers to d/c or necessity for inpatient status:  O2 requirements need to decrease from  6 L to home regimen, symptoms to improve with Baricitinib in addition to IV remdesivir and Decadron, consults  :  None  Procedures  :  none  DVT Prophylaxis  :  Lovenox   Lab Results  Component Value Date   PLT 141 (L) 04/11/2019    Diet :  Diet Order            Diet regular Room service appropriate? Yes; Fluid consistency: Thin  Diet effective now               Inpatient Medications Scheduled Meds: . amLODipine  2.5 mg Oral q morning - 10a  . aspirin EC  81 mg Oral QHS  . balsalazide  1,500 mg Oral BID  . baricitinib  4 mg Oral Daily  . doxazosin  4 mg Oral QHS  . enoxaparin (LOVENOX) injection  60 mg Subcutaneous Daily  . furosemide  40 mg Oral q morning - 10a  . ipratropium  2 puff Inhalation BID  . losartan  25 mg Oral Daily  . methylPREDNISolone (SOLU-MEDROL) injection  1 mg/kg/day Intravenous Q12H  . metoprolol tartrate  50 mg Oral QHS  . pravastatin  40 mg Oral QHS  . sertraline  100 mg Oral Daily  . sodium chloride flush  3 mL Intravenous Q12H   Continuous Infusions: . remdesivir 100 mg in NS 100 mL 100 mg (04/11/19 1003)   PRN Meds:.acetaminophen, albuterol, benzonatate, hydrALAZINE, liver oil-zinc oxide  Antibiotics  :   Anti-infectives (From admission, onward)   Start     Dose/Rate Route Frequency Ordered Stop   04/10/19 1000  remdesivir 100 mg in sodium chloride 0.9 % 100 mL IVPB     100 mg 200 mL/hr over 30 Minutes Intravenous Daily 04/09/19 0200 04/14/19 0959   04/09/19 1115  remdesivir 100 mg in sodium chloride 0.9 % 100 mL IVPB     100 mg 200 mL/hr over 30 Minutes Intravenous NOW 04/09/19 1027 04/09/19 1130   04/09/19 0200  remdesivir 100 mg in sodium chloride 0.9 % 100 mL IVPB     100 mg 200 mL/hr over 30 Minutes Intravenous Every 1 hr x 2 04/09/19 0159 04/09/19 0359   04/08/19 2359  azithromycin (ZITHROMAX) 500 mg in sodium chloride 0.9 % 250 mL IVPB  Status:  Discontinued     500 mg 250 mL/hr over 60 Minutes Intravenous Every 24 hours 04/08/19 2302 04/08/19 2329   04/08/19 2315  cefTRIAXone (ROCEPHIN) 2 g in sodium chloride 0.9 % 100 mL IVPB  Status:   Discontinued     2 g 200 mL/hr over 30 Minutes Intravenous Every 24 hours 04/08/19 2302 04/08/19 2329       Objective   Vitals:   04/10/19 1624 04/10/19 2013 04/11/19 0500 04/11/19 0528  BP: (!) 162/63 131/66  (!) 156/55  Pulse: 64 73  (!) 55  Resp: 18 20  20   Temp: 97.8 F (36.6 C) 97.7 F (36.5 C)  97.8 F (36.6 C)  TempSrc: Oral Oral  Oral  SpO2: 97% 94%  94%  Weight:   129.4 kg   Height:        SpO2: 94 % O2 Flow Rate (L/min): 6 L/min  Wt Readings from Last 3 Encounters:  04/11/19 129.4 kg  12/23/18 121.3 kg  11/24/18 133.2 kg     Intake/Output Summary (Last 24 hours) at 04/11/2019 1114  Last data filed at 04/11/2019 0900 Gross per 24 hour  Intake 340 ml  Output 350 ml  Net -10 ml    Physical Exam:     Resting in bedside chair comfortably Awake Alert, Oriented X person, place, time, context, Normal affect No new F.N deficits,  [place].AT, Normal respiratory effort 6 L high flow nasal cannula, no  wheezing, able to speak in complete sentences,  no accessory muscle use RRR,No Gallops,Rubs or new Murmurs,  +ve B.Sounds, Abd Soft, No tenderness with deep palpation, No rebound, guarding or rigidity. No Cyanosis, No new Rash or bruise     I have personally reviewed the following:   Data Reviewed:  CBC Recent Labs  Lab 04/08/19 2249 04/09/19 0523 04/10/19 1004 04/11/19 0340  WBC 8.9 6.4 6.0 5.8  HGB 13.8 13.9 15.0 14.0  HCT 44.0 44.2 47.3 45.0  PLT 125* 114* 126* 141*  MCV 92.4 93.2 92.0 92.2  MCH 29.0 29.3 29.2 28.7  MCHC 31.4 31.4 31.7 31.1  RDW 14.6 14.6 14.1 14.0  LYMPHSABS 1.4 1.2 1.3 1.7  MONOABS 1.2* 0.7 0.2 0.3  EOSABS 0.0 0.0 0.0 0.0  BASOSABS 0.0 0.0 0.0 0.0    Chemistries  Recent Labs  Lab 04/08/19 2249 04/09/19 0149 04/09/19 0523 04/10/19 1004  NA 140  --  141 142  K 3.5  --  3.5 3.9  CL 104  --  103 103  CO2 26  --  29 28  GLUCOSE 142*  --  159* 200*  BUN 22  --  21 30*  CREATININE 1.24  --  1.16 1.18  CALCIUM 8.7*  --   8.9 9.2  MG  --  2.1 2.0  --   AST 22  --  24 29  ALT 17  --  17 19  ALKPHOS 52  --  48 53  BILITOT 0.8  --  0.8 0.4   ------------------------------------------------------------------------------------------------------------------ Recent Labs    04/08/19 2249  TRIG 107    Lab Results  Component Value Date   HGBA1C 5.9 (H) 12/22/2018   ------------------------------------------------------------------------------------------------------------------ No results for input(s): TSH, T4TOTAL, T3FREE, THYROIDAB in the last 72 hours.  Invalid input(s): FREET3 ------------------------------------------------------------------------------------------------------------------ Recent Labs    04/09/19 0524 04/10/19 1004  FERRITIN 461* 774*    Coagulation profile Recent Labs  Lab 04/08/19 2249  INR 1.1    Recent Labs    04/09/19 0523 04/10/19 1004  DDIMER 1.56* 2.08*    Cardiac Enzymes No results for input(s): CKMB, TROPONINI, MYOGLOBIN in the last 168 hours.  Invalid input(s): CK ------------------------------------------------------------------------------------------------------------------    Component Value Date/Time   BNP 99.5 12/21/2018 1853    Micro Results Recent Results (from the past 240 hour(s))  Blood Culture (routine x 2)     Status: None (Preliminary result)   Collection Time: 04/08/19 10:49 PM   Specimen: BLOOD  Result Value Ref Range Status   Specimen Description   Final    BLOOD RIGHT ANTECUBITAL Performed at [facility] [address], [place], [place] [address]    Special Requests   Final    BOTTLES DRAWN AEROBIC AND ANAEROBIC Blood Culture results may not be optimal due to an excessive volume of blood received in culture bottles Performed at [facility] [address], [place], [place] [address]    Culture   Final    NO GROWTH 1 DAY Performed at [facility], [place] [address], [place],  [place] [address]    Report  Status PENDING  Incomplete  Blood Culture (routine x 2)     Status: None (Preliminary result)   Collection Time: 04/08/19 10:49 PM   Specimen: BLOOD  Result Value Ref Range Status   Specimen Description   Final    BLOOD BLOOD RIGHT HAND Performed at [facility] [address], [place], [place] [address]    Special Requests   Final    BOTTLES DRAWN AEROBIC AND ANAEROBIC Blood Culture results may not be optimal due to an excessive volume of blood received in culture bottles Performed at [facility] [address], [place], [place] [address]    Culture   Final    NO GROWTH 1 DAY Performed at [facility], [place] [address] [place], [place] [address]    Report Status PENDING  Incomplete  Urine culture     Status: Abnormal   Collection Time: 04/09/19  3:19 AM   Specimen: In/Out Cath Urine  Result Value Ref Range Status   Specimen Description   Final    IN/OUT CATH URINE Performed at [facility] [address], [place],  [address]    Special Requests   Final    NONE Performed at [facility] [place] [address] [place],  [address]    Culture MULTIPLE SPECIES PRESENT, SUGGEST RECOLLECTION (A)  Final   Report Status 04/10/2019 FINAL  Final    Radiology Reports DG CHEST PORT 1 VIEW  Result Date: 04/10/2019 CLINICAL DATA:  Acute on chronic respiratory failure in a COVID-19 positive patient. EXAM: PORTABLE CHEST 1 VIEW COMPARISON:  Single-view of the chest 04/08/2019 12/21/2018. CT chest 12/22/2018 FINDINGS: The lungs are emphysematous. Mild bibasilar atelectasis is present. There is cardiomegaly and atherosclerosis. No pneumothorax or pleural effusion. IMPRESSION: No acute disease. Cardiomegaly. Emphysema. Atherosclerosis Electronically Signed   By: Inge Rise M.D.   On: 04/10/2019 13:50   DG Chest Port 1 View  Result Date: 04/08/2019 CLINICAL DATA:  Cough EXAM:  PORTABLE CHEST 1 VIEW COMPARISON:  12/21/2018 FINDINGS: Cardiac shadow is enlarged but stable. Aortic calcifications are again seen. The lungs are well aerated bilaterally. Patchy left basilar atelectasis is seen. No bony abnormality is seen. IMPRESSION: Patchy left basilar atelectasis. Electronically Signed   By: Inez Catalina M.D.   On: 04/08/2019 21:16     Time Spent in minutes  30     Desiree Hane M.D on 04/11/2019 at 11:14 AM  To page go to [email or web address] - password [facility]

## 2019-04-12 ENCOUNTER — Ambulatory Visit: Payer: Medicare Other

## 2019-04-12 LAB — CBC WITH DIFFERENTIAL/PLATELET
Abs Immature Granulocytes: 0.02 10*3/uL (ref 0.00–0.07)
Basophils Absolute: 0 10*3/uL (ref 0.0–0.1)
Basophils Relative: 0 %
Eosinophils Absolute: 0 10*3/uL (ref 0.0–0.5)
Eosinophils Relative: 0 %
HCT: 45 % (ref 39.0–52.0)
Hemoglobin: 14.1 g/dL (ref 13.0–17.0)
Immature Granulocytes: 0 %
Lymphocytes Relative: 23 %
Lymphs Abs: 1.5 10*3/uL (ref 0.7–4.0)
MCH: 28.9 pg (ref 26.0–34.0)
MCHC: 31.3 g/dL (ref 30.0–36.0)
MCV: 92.2 fL (ref 80.0–100.0)
Monocytes Absolute: 0.3 10*3/uL (ref 0.1–1.0)
Monocytes Relative: 5 %
Neutro Abs: 4.8 10*3/uL (ref 1.7–7.7)
Neutrophils Relative %: 72 %
Platelets: 145 10*3/uL — ABNORMAL LOW (ref 150–400)
RBC: 4.88 MIL/uL (ref 4.22–5.81)
RDW: 13.8 % (ref 11.5–15.5)
WBC: 6.7 10*3/uL (ref 4.0–10.5)
nRBC: 0 % (ref 0.0–0.2)

## 2019-04-12 LAB — C-REACTIVE PROTEIN: CRP: 1.4 mg/dL — ABNORMAL HIGH (ref <1.0)

## 2019-04-12 LAB — D-DIMER, QUANTITATIVE: D-Dimer, Quant: 1.37 ug/mL-FEU — ABNORMAL HIGH (ref 0.00–0.50)

## 2019-04-12 MED ORDER — AMLODIPINE BESYLATE 5 MG PO TABS
2.5000 mg | ORAL_TABLET | Freq: Once | ORAL | Status: AC
  Administered 2019-04-12: 2.5 mg via ORAL
  Filled 2019-04-12: qty 1

## 2019-04-12 MED ORDER — AMLODIPINE BESYLATE 5 MG PO TABS
5.0000 mg | ORAL_TABLET | Freq: Every morning | ORAL | Status: DC
  Administered 2019-04-13: 5 mg via ORAL
  Filled 2019-04-12: qty 1

## 2019-04-12 MED ORDER — DEXAMETHASONE 4 MG PO TABS
6.0000 mg | ORAL_TABLET | Freq: Every day | ORAL | Status: DC
  Administered 2019-04-12 – 2019-04-13 (×2): 6 mg via ORAL
  Filled 2019-04-12 (×2): qty 2

## 2019-04-12 NOTE — Progress Notes (Signed)
[facility]  PROGRESS NOTE  [name] QTM:[id number] DOB: July 07, 1940 DOA: 04/08/2019 PCP: [name], MD Admit date - 04/08/2019   Admitting Physician Rhetta Mura, DO  Outpatient Primary MD for the patient is [name], MD  LOS - 3 Brief Narrative   [name] is a 79 y.o. 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  He lives with his daughter who was diagnosed with COVID on 2/27 about a week ago who presented on 04/08/2019 with days of congestion, headache, cough productive of green sputum, and fever.  Of note patient did receive first dose of Covid vaccine on 03/21/2019.  In the ED he had a T-max 99.9, tachypneic with respiratory rate of 23, stable O2 saturation of 93-96% on 4 L.  Lab work notable for positive Covid test, BNP unremarkable, platelets 125, D-dimer 1.66, procalcitonin 0.10, ferritin 411, CRP 6.7.  Influenza panel negative, lactic acid 1.6.  Chest x-ray showed patchy left basilar atelectasis.  Patient was admitted with working diagnosis of COVID-19 pneumonia started on IV remdesivir (3/4). Hospital course complicated by increasing oxygen requirement to 7 L high flow on 3/5. Given hypoxia, persistently elevated inflammatory markers, worsening clinically  Baricitinib was added( shorter acting, avoiding longer acting Actemra to avoid increased risk of infection in the setting of chronic outpatient prednisone therapy)       Subjective  Today he continues to feels much better. Cough still present occasionally productive of phlegm  A & P  Acute on chronic hypoxic respiratory failure secondary to Covid -19 pneumonia in patient with severe COPD, improving CXR shows atelectasis, Patient requiring a little less O2 at 4 L HF today and maintaining SpO2 >90%. His goal is > 88% given COPD. He has  no tachypnea or increased WOB so this is improved from yesterday with continuation of baritinib. His home O2 is 4 L [place].   His CRP is 1.4  -Continue baritinib as symptoms improve, likely last day -switch from IV Solumedrol to oral decadron, on discharge will need to go to higher dose of prednisone 40 mg and continue previous taper -Aggressive pulmonary regimen: Incentive spirometry, flutter valve, out of bed to chair, mobilize --wean O2 to home regimen, O2 ambulatory test -Daily CBC with differential, CRP, ferritin, D-dimer  -decrease frequency of duo -nebs, no wheezing on exam  HTN, elevated but improving SBP range 150s-160s, initially held amlodipine and home losartan in setting of infectious presentation on admission and normotensive BP range,  -Continue home, lasix, losartan, lopressor -increase amlodipine to 5 mg - as needed IV hydralazine as needed  Obesity BMI 37  Chronic ulcerative proctosigmoiditis with chronic diarrhea Followed by [facility]. Has had diarrhea for years with minimal improvement on prednisone taper started on 12/20.  Daughter confirms patient was currently on prednisone 30 mg prior to admission - switch to decadron from IV solumedrol --home Balsalazide(5-ASA) -When able to go back to oral resume prednisone but at higher taper of 40 mg  BPH, stable -Monitor output, continue home doxazosin  HLD, stable -On pravastatin  CAD, stable S/p stent 1996  Depression, stable -Home Zoloft      Family Communication  : called daughter Trentin Knappenberger at [phone] on 3/7 at bedside with patient, no answer, will try [name] ( other daughter) as long as patient ok  Code Status : Full code  Disposition Plan  :  Patient is from home. Anticipated d/c date:  2 to 3 days. Barriers to d/c or necessity for inpatient status:  O2 requirements need to decrease from  4 L HF to home regimen of 4L, symptoms to improve with Baricitinib,   consults  : None  Procedures  :   none  DVT Prophylaxis  :  Lovenox   Lab Results  Component Value Date   PLT 145 (L) 04/12/2019    Diet :  Diet Order            Diet regular Room service appropriate? Yes; Fluid consistency: Thin  Diet effective now               Inpatient Medications Scheduled Meds: . amLODipine  2.5 mg Oral q morning - 10a  . aspirin EC  81 mg Oral QHS  . balsalazide  1,500 mg Oral BID  . baricitinib  4 mg Oral Daily  . dexamethasone  6 mg Oral Daily  . doxazosin  4 mg Oral QHS  . enoxaparin (LOVENOX) injection  60 mg Subcutaneous Daily  . furosemide  40 mg Oral q morning - 10a  . ipratropium  2 puff Inhalation BID  . losartan  25 mg Oral Daily  . metoprolol tartrate  50 mg Oral QHS  . pravastatin  40 mg Oral QHS  . sertraline  100 mg Oral Daily  . sodium chloride flush  3 mL Intravenous Q12H   Continuous Infusions: . remdesivir 100 mg in NS 100 mL 100 mg (04/12/19 1025)   PRN Meds:.acetaminophen, albuterol, benzonatate, hydrALAZINE, liver oil-zinc oxide, phenylephrine-shark liver oil-mineral oil-petrolatum  Antibiotics  :   Anti-infectives (From admission, onward)   Start     Dose/Rate Route Frequency Ordered Stop   04/10/19 1000  remdesivir 100 mg in sodium chloride 0.9 % 100 mL IVPB     100 mg 200 mL/hr over 30 Minutes Intravenous Daily 04/09/19 0200 04/14/19 0959   04/09/19 1115  remdesivir 100 mg in sodium chloride 0.9 % 100 mL IVPB     100 mg 200 mL/hr over 30 Minutes Intravenous NOW 04/09/19 1027 04/09/19 1130   04/09/19 0200  remdesivir 100 mg in sodium chloride 0.9 % 100 mL IVPB     100 mg 200 mL/hr over 30 Minutes Intravenous Every 1 hr x 2 04/09/19 0159 04/09/19 0359   04/08/19 2359  azithromycin (ZITHROMAX) 500 mg in sodium chloride 0.9 % 250 mL IVPB  Status:  Discontinued     500 mg 250 mL/hr over 60 Minutes Intravenous Every 24 hours 04/08/19 2302 04/08/19 2329   04/08/19 2315  cefTRIAXone (ROCEPHIN) 2 g in sodium chloride 0.9 % 100 mL IVPB  Status:   Discontinued     2 g 200 mL/hr over 30 Minutes Intravenous Every 24 hours 04/08/19 2302 04/08/19 2329       Objective   Vitals:   04/11/19 2105 04/12/19 0620 04/12/19 0624 04/12/19 1016  BP: (!) 191/80 (!) 162/74  (!) 153/60  Pulse: (!) 54 (!) 54    Resp: 19 19    Temp: 98.2 F (36.8 C)     TempSrc: Oral     SpO2: 91% 91%    Weight:   129.6 kg   Height:        SpO2: 91 % O2 Flow Rate (L/min): 4 L/min  Wt Readings from Last 3 Encounters:  04/12/19 129.6 kg  12/23/18 121.3 kg  11/24/18 133.2 kg     Intake/Output Summary (Last 24  hours) at 04/12/2019 1117 Last data filed at 04/12/2019 0800 Gross per 24 hour  Intake 480 ml  Output 1976 ml  Net -1496 ml    Physical Exam:     Lying comfortably in bed Awake Alert, Oriented X person, place, time, context, Normal affect No new F.N deficits,  [place].AT, Normal respiratory effort 4 L high flow nasal cannula, no  wheezing, able to speak in complete sentences,  no accessory muscle use RRR,No Gallops,Rubs or new Murmurs,  +ve B.Sounds, Abd Soft, No tenderness with deep palpation, No rebound, guarding or rigidity. No Cyanosis, No new Rash or bruise     I have personally reviewed the following:   Data Reviewed:  CBC Recent Labs  Lab 04/08/19 2249 04/09/19 0523 04/10/19 1004 04/11/19 0340 04/12/19 0341  WBC 8.9 6.4 6.0 5.8 6.7  HGB 13.8 13.9 15.0 14.0 14.1  HCT 44.0 44.2 47.3 45.0 45.0  PLT 125* 114* 126* 141* 145*  MCV 92.4 93.2 92.0 92.2 92.2  MCH 29.0 29.3 29.2 28.7 28.9  MCHC 31.4 31.4 31.7 31.1 31.3  RDW 14.6 14.6 14.1 14.0 13.8  LYMPHSABS 1.4 1.2 1.3 1.7 1.5  MONOABS 1.2* 0.7 0.2 0.3 0.3  EOSABS 0.0 0.0 0.0 0.0 0.0  BASOSABS 0.0 0.0 0.0 0.0 0.0    Chemistries  Recent Labs  Lab 04/08/19 2249 04/09/19 0149 04/09/19 0523 04/10/19 1004  NA 140  --  141 142  K 3.5  --  3.5 3.9  CL 104  --  103 103  CO2 26  --  29 28  GLUCOSE 142*  --  159* 200*  BUN 22  --  21 30*  CREATININE 1.24  --  1.16 1.18   CALCIUM 8.7*  --  8.9 9.2  MG  --  2.1 2.0  --   AST 22  --  24 29  ALT 17  --  17 19  ALKPHOS 52  --  48 53  BILITOT 0.8  --  0.8 0.4   ------------------------------------------------------------------------------------------------------------------ No results for input(s): CHOL, HDL, LDLCALC, TRIG, CHOLHDL, LDLDIRECT in the last 72 hours.  Lab Results  Component Value Date   HGBA1C 5.9 (H) 12/22/2018   ------------------------------------------------------------------------------------------------------------------ No results for input(s): TSH, T4TOTAL, T3FREE, THYROIDAB in the last 72 hours.  Invalid input(s): FREET3 ------------------------------------------------------------------------------------------------------------------ Recent Labs    04/10/19 1004  FERRITIN 774*    Coagulation profile Recent Labs  Lab 04/08/19 2249  INR 1.1    Recent Labs    04/11/19 1109 04/12/19 0341  DDIMER 1.89* 1.37*    Cardiac Enzymes No results for input(s): CKMB, TROPONINI, MYOGLOBIN in the last 168 hours.  Invalid input(s): CK ------------------------------------------------------------------------------------------------------------------    Component Value Date/Time   BNP 99.5 12/21/2018 1853    Micro Results Recent Results (from the past 240 hour(s))  Blood Culture (routine x 2)     Status: None (Preliminary result)   Collection Time: 04/08/19 10:49 PM   Specimen: BLOOD  Result Value Ref Range Status   Specimen Description   Final    BLOOD RIGHT ANTECUBITAL Performed at [facility] [address], [place], [place] [address]    Special Requests   Final    BOTTLES DRAWN AEROBIC AND ANAEROBIC Blood Culture results may not be optimal due to an excessive volume of blood received in culture bottles Performed at [facility] [address] [place],  [address]    Culture   Final    NO GROWTH 3 DAYS Performed  at [facility], [place] [address], [place], [place] [address]    Report Status PENDING  Incomplete  Blood Culture (routine x 2)     Status: None (Preliminary result)   Collection Time: 04/08/19 10:49 PM   Specimen: BLOOD  Result Value Ref Range Status   Specimen Description   Final    BLOOD BLOOD RIGHT HAND Performed at [facility] [address], [place], [place] [address]    Special Requests   Final    BOTTLES DRAWN AEROBIC AND ANAEROBIC Blood Culture results may not be optimal due to an excessive volume of blood received in culture bottles Performed at [facility] [address], [place], [place] [address]    Culture   Final    NO GROWTH 3 DAYS Performed at [facility], [place] [address], [place], [place] [address]    Report Status PENDING  Incomplete  Urine culture     Status: Abnormal   Collection Time: 04/09/19  3:19 AM   Specimen: In/Out Cath Urine  Result Value Ref Range Status   Specimen Description   Final    IN/OUT CATH URINE Performed at [facility] [address], [place], [place] [address]    Special Requests   Final    NONE Performed at [facility], [place] [address], [place], [place] [address]    Culture MULTIPLE SPECIES PRESENT, SUGGEST RECOLLECTION (A)  Final   Report Status 04/10/2019 FINAL  Final    Radiology Reports DG CHEST PORT 1 VIEW  Result Date: 04/10/2019 CLINICAL DATA:  Acute on chronic respiratory failure in a COVID-19 positive patient. EXAM: PORTABLE CHEST 1 VIEW COMPARISON:  Single-view of the chest 04/08/2019 12/21/2018. CT chest 12/22/2018 FINDINGS: The lungs are emphysematous. Mild bibasilar atelectasis is present. There is cardiomegaly and atherosclerosis. No pneumothorax or pleural effusion. IMPRESSION: No acute disease. Cardiomegaly. Emphysema. Atherosclerosis Electronically Signed   By: Inge Rise M.D.   On: 04/10/2019 13:50   DG Chest Port 1  View  Result Date: 04/08/2019 CLINICAL DATA:  Cough EXAM: PORTABLE CHEST 1 VIEW COMPARISON:  12/21/2018 FINDINGS: Cardiac shadow is enlarged but stable. Aortic calcifications are again seen. The lungs are well aerated bilaterally. Patchy left basilar atelectasis is seen. No bony abnormality is seen. IMPRESSION: Patchy left basilar atelectasis. Electronically Signed   By: Inez Catalina M.D.   On: 04/08/2019 21:16     Time Spent in minutes  30     Desiree Hane M.D on 04/12/2019 at 11:17 AM  To page go to [email or web address] - password [facility]

## 2019-04-13 ENCOUNTER — Other Ambulatory Visit: Payer: Self-pay | Admitting: Nurse Practitioner

## 2019-04-13 DIAGNOSIS — N400 Enlarged prostate without lower urinary tract symptoms: Secondary | ICD-10-CM

## 2019-04-13 DIAGNOSIS — K5190 Ulcerative colitis, unspecified, without complications: Secondary | ICD-10-CM

## 2019-04-13 DIAGNOSIS — R197 Diarrhea, unspecified: Secondary | ICD-10-CM

## 2019-04-13 DIAGNOSIS — I5032 Chronic diastolic (congestive) heart failure: Secondary | ICD-10-CM

## 2019-04-13 MED ORDER — PREDNISONE 10 MG PO TABS: 30.0000 mg | ORAL_TABLET | ORAL | Status: DC

## 2019-04-13 MED ORDER — BENZONATATE 200 MG PO CAPS: 200.0000 mg | ORAL_CAPSULE | Freq: Two times a day (BID) | ORAL | 0 refills | Status: DC | PRN

## 2019-04-13 NOTE — Progress Notes (Signed)
OT Cancellation Note  Patient Details Name: YONIS CARREON MRN: [id number] DOB: February 05, 1941   Cancelled Treatment:    Reason Eval/Treat Not Completed: Other (comment)  Noted plans to DC home with family this afternoon and was mod I with PT. Will sign off Kari Baars, [place] [phone] Office- [phone], Edwena Felty D 04/13/2019, 2:50 PM

## 2019-04-13 NOTE — Telephone Encounter (Signed)
I am aware of the patient being in the hospital thank you

## 2019-04-13 NOTE — Care Management Important Message (Signed)
Important Message  Patient Details IM Letter given to Gabriel Earing RN Case Manager to present to the Patient Name: [name] MRN: [id number] Date of Birth: 02-Sep-1940   Medicare Important Message Given:  Yes     Kerin Salen 04/13/2019, 11:06 AM

## 2019-04-13 NOTE — Telephone Encounter (Signed)
What dosage is patient on? It looks like patient is reporting different than what we have.

## 2019-04-13 NOTE — Telephone Encounter (Signed)
Spoke with pt. He is taking the medication twice daily.

## 2019-04-13 NOTE — Discharge Instructions (Addendum)
On discharge you will take a higher dose of prednisone and taper down:  Prednisone 40 mg x 7 days (starting 3/9), then 35 mg x7 days, then 30 mg x 7 days, then 25 mg x 7 days, then 20 mg x 7 days, then 52m x 7 days, then 10 mg x 7 days, then 5 mg x 7 days until your taper is complete in 8 weeks

## 2019-04-13 NOTE — Discharge Summary (Signed)
[name] JKD:[id number] DOB: [birth year] DOA: 04/08/2019  PCP: [name], MD  Admit date: 04/08/2019 Discharge date: 04/13/2019  Admitted From: Home Disposition:  Home  Recommendations for Outpatient Follow-up:  1. Follow up with PCP in 1-2 weeks 2. New medications: Changed prednisone taper to 40 mg x7 days and drop 5 mg every week until complete. ---Daughter Angie understood plan 3. Please obtain BMP/CBC in one week 4. Please follow up on the following pending results:  Home Health:none Equipment/Devices:Resume Home 4 L [place] Discharge Condition:Stable  CODE STATUS:FULL    Brief/Interim Summary: History of present illness:  [name] is a 79 y.o. 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  He lives with his daughter who was diagnosed with COVID on 2/27 about a week ago who presented on 04/08/2019 with days of congestion, headache, cough productive of green sputum, and fever.  Of note patient did receive first dose of Covid vaccine on 03/21/2019.  In the ED he had a T-max 99.9, tachypneic with respiratory rate of 23, stable O2 saturation of 93-96% on 4 L.  Lab work notable for positive Covid test, BNP unremarkable, platelets 125, D-dimer 1.66, procalcitonin 0.10, ferritin 411, CRP 6.7.  Influenza panel negative, lactic acid 1.6.  Chest x-ray showed patchy left basilar atelectasis.  Patient was admitted with working diagnosis of COVID-19 pneumonia started on IV remdesivir (3/4). Hospital course complicated by increasing oxygen requirement to 7 L high flow on 3/5. Given hypoxia, persistently elevated inflammatory markers, worsening clinically  Baricitinib was added( shorter acting, avoiding longer acting Actemra to avoid increased risk of infection in the setting of chronic outpatient prednisone therapy)    Remaining hospital course addressed in problem based format below:   Hospital Course:   Acute on chronic hypoxic respiratory failure secondary to COVID-19 pneumonia in patient with severe COPD Sick contacts included daughter and her husband who both are COVID+. Patient with worsening cough, DOE, and new O2 requirement above home requirements. Needed as much as 7 L HF [place]. CXR showed only atelectasis. Was treated with IV remdesivir, IV solumedrol, and baracitinib with scheduled inhalers in setting of COPD. Successfully weaned back to home 4 L regimen --back on home 4 L O2 --will continue prednisone taper at 40 mg and decreased accordingly ( every 7 days by 5 mg) --resume home inhalers --self isolation and precautions advised to patient and family on discharge  HTN Had high range SBPs in 160s while on IV solumedrol but improved with increase of home amlodipine to 5 mg while in hospital --can resume previous home amlodipine dose now that off IV steroids --continue home lasix, losartan, lopressor  Chronic ulcerative proctosigmoiditis with chronic diarrhea Followed by [facility]. Was on prednisone taper prior to admission of 30 mg with slow taper.  --continue prednisone taper at high dose of 40 mg, discussed with patient and daughter at day of discharge --home Balasalazide  BPH, stable --home doxazosin  HLD, stable --home pravastatin  Depression, stable --home zoloft   Consultations:  none  Procedures/Studies: none Subjective: Feels breathing back to baseline. Mild cough. Eating well. No fevers or chill Discharge Exam: Vitals:   04/13/19 0454 04/13/19 1214  BP: (!) 149/47 (!) 159/71  Pulse: (!) 55 (!) 55  Resp: (!) 22 20  Temp: 97.6 F (36.4 C) (!) 97.5 F (36.4 C)  SpO2: 95% 95%   Vitals:  04/12/19 1343 04/12/19 2034 04/13/19 0454 04/13/19 1214  BP: (!) 142/67 109/89 (!) 149/47 (!) 159/71  Pulse: (!) 56 64 (!) 55 (!) 55  Resp: 18 20 (!) 22 20  Temp: 99.8 F (37.7 C) (!)  97.5 F (36.4 C) 97.6 F (36.4 C) (!) 97.5 F (36.4 C)  TempSrc: Oral Oral Oral Oral  SpO2: 94% 92% 95% 95%  Weight:   128 kg   Height:        General: Sitting in bedside chair comfortably, no apparent distress Eyes: EOMI, anicteric ENT: Oral Mucosa clear and moist Cardiovascular: regular rate and rhythm, no murmurs, rubs or gallops, no edema, Respiratory: Normal respiratory effort on 4 L [place] , lungs clear to auscultation bilaterally Abdomen: soft, non-distended, non-tender, normal bowel sounds Skin: No Rash Neurologic: Grossly no focal neuro deficit.Mental status AAOx3, speech normal, Psychiatric:Appropriate affect, and mood  Discharge Diagnoses:  Principal Problem:   Pneumonia due to COVID-19 virus Active Problems:   Diarrhea   BPH (benign prostatic hyperplasia)   GERD (gastroesophageal reflux disease)   Essential hypertension   Morbid obesity ([name])   Shortness of breath   COPD (chronic obstructive pulmonary disease) ([name])   Chronic ulcerative proctosigmoiditis, without complications ([name])   Chronic respiratory failure with hypoxia ([name])   Obesity with body mass index (BMI) of 30.0 to 39.9    Discharge Instructions  Discharge Instructions    Diet - low sodium heart healthy   Complete by: As directed    Increase activity slowly   Complete by: As directed    MyChart COVID-19 home monitoring program   Complete by: Apr 13, 2019    Is the patient willing to use the [place] for home monitoring?: Yes   Temperature monitoring   Complete by: Apr 13, 2019    After how many days would you like to receive a notification of this patient's flowsheet entries?: 1     Allergies as of 04/13/2019   No Known Allergies     Medication List    TAKE these medications   acetaminophen 325 MG tablet Commonly known as: TYLENOL Take 650 mg by mouth every 6 (six) hours as needed for moderate pain or headache.   albuterol 108 (90 Base) MCG/ACT inhaler Commonly known as:  VENTOLIN HFA Inhale 2 puffs into the lungs every 6 (six) hours as needed for wheezing or shortness of breath.   amLODipine 2.5 MG tablet Commonly known as: NORVASC Take 1 tablet (2.5 mg total) by mouth every morning.   aspirin 81 MG tablet Take 1 tablet (81 mg total) by mouth at bedtime.   balsalazide 750 MG capsule Commonly known as: COLAZAL TAKE 3 CAPSULES BY MOUTH THREE TIMES DAILY What changed:   how much to take  when to take this   benzonatate 200 MG capsule Commonly known as: TESSALON Take 1 capsule (200 mg total) by mouth 2 (two) times daily as needed for cough.   clonazePAM 0.5 MG tablet Commonly known as: KlonoPIN Take 1/2-1 tablet po BID PRN agitation. Caution:drowsiness; use sparingly   doxazosin 4 MG tablet Commonly known as: CARDURA Take one tablet by mouth at bedtime What changed:   how much to take  how to take this  when to take this  additional instructions   furosemide 40 MG tablet Commonly known as: LASIX Take 1 tablet (40 mg total) by mouth every morning.   losartan 25 MG tablet Commonly known as: COZAAR TAKE 1 TABLET BY MOUTH ONCE DAILY IN  THE MORNING What changed: See the new instructions.   metoprolol tartrate 50 MG tablet Commonly known as: LOPRESSOR One po QHS What changed:   how much to take  how to take this  when to take this  additional instructions   mupirocin ointment 2 % Commonly known as: Bactroban Apply thin layer to affected area once a day   nitroGLYCERIN 0.4 MG SL tablet Commonly known as: NITROSTAT Place 0.4 mg under the tongue every 5 (five) minutes as needed for chest pain.   pravastatin 40 MG tablet Commonly known as: PRAVACHOL Take 1 tablet (40 mg total) by mouth at bedtime.   predniSONE 10 MG tablet Commonly known as: DELTASONE Take 3 tablets (30 mg total) by mouth See admin instructions. 40 MG x7 days, 35 MG x 7 days, 30 MG x 7 days 25 MG x7 day, 20 MG x7 days, then 15 MG x 7 days, then 10 MG x 7  days, then 5 mg x 7 days until your taper is complete in 8 weeks What changed:   additional instructions  Another medication with the same name was removed. Continue taking this medication, and follow the directions you see here.   sertraline 100 MG tablet Commonly known as: ZOLOFT Take one tablet daily   Spiriva HandiHaler 18 MCG inhalation capsule Generic drug: tiotropium One inhalation po daily   triamcinolone cream 0.1 % Commonly known as: KENALOG Apply twice daily as needed for psoriasis.   Vitamin D (Ergocalciferol) 1.25 MG (50000 UNIT) Caps capsule Commonly known as: DRISDOL Take 50,000 Units by mouth every 7 (seven) days. mondays       No Known Allergies      The results of significant diagnostics from this hospitalization (including imaging, microbiology, ancillary and laboratory) are listed below for reference.     Microbiology: Recent Results (from the past 240 hour(s))  Blood Culture (routine x 2)     Status: None (Preliminary result)   Collection Time: 04/08/19 10:49 PM   Specimen: BLOOD  Result Value Ref Range Status   Specimen Description   Final    BLOOD RIGHT ANTECUBITAL Performed at [facility] [address], [place], [place] [address]    Special Requests   Final    BOTTLES DRAWN AEROBIC AND ANAEROBIC Blood Culture results may not be optimal due to an excessive volume of blood received in culture bottles Performed at [facility] [address], [place], [place] [address]    Culture   Final    NO GROWTH 4 DAYS Performed at [facility], [place] [address], [place], [place] [address]    Report Status PENDING  Incomplete  Blood Culture (routine x 2)     Status: None (Preliminary result)   Collection Time: 04/08/19 10:49 PM   Specimen: BLOOD  Result Value Ref Range Status   Specimen Description   Final    BLOOD BLOOD RIGHT HAND Performed at [facility] [address],  [place], [place] [address]    Special Requests   Final    BOTTLES DRAWN AEROBIC AND ANAEROBIC Blood Culture results may not be optimal due to an excessive volume of blood received in culture bottles Performed at [facility] [address], [place], [place] [address]    Culture   Final    NO GROWTH 4 DAYS Performed at [facility], [place] [address], [place], [place] [address]    Report Status PENDING  Incomplete  Urine culture     Status: Abnormal   Collection Time: 04/09/19  3:19 AM   Specimen: In/Out Cath Urine  Result Value Ref Range Status   Specimen Description   Final    IN/OUT CATH URINE Performed at [facility], [place] [address], [place], [place] [address]    Special Requests   Final    NONE Performed at [facility], [place] [address], [place], [place] [address]    Culture MULTIPLE SPECIES PRESENT, SUGGEST RECOLLECTION (A)  Final   Report Status 04/10/2019 FINAL  Final     Labs: BNP (last 3 results) Recent Labs    11/07/18 1140 12/21/18 1853  BNP 93.1 23.5   Basic Metabolic Panel: Recent Labs  Lab 04/08/19 2249 04/09/19 0149 04/09/19 0523 04/10/19 1004  NA 140  --  141 142  K 3.5  --  3.5 3.9  CL 104  --  103 103  CO2 26  --  29 28  GLUCOSE 142*  --  159* 200*  BUN 22  --  21 30*  CREATININE 1.24  --  1.16 1.18  CALCIUM 8.7*  --  8.9 9.2  MG  --  2.1 2.0  --   PHOS  --   --  3.8  --    Liver Function Tests: Recent Labs  Lab 04/08/19 2249 04/09/19 0523 04/10/19 1004  AST 22 24 29   ALT 17 17 19   ALKPHOS 52 48 53  BILITOT 0.8 0.8 0.4  PROT 6.8 6.4* 6.8  ALBUMIN 3.2* 3.2* 3.2*   No results for input(s): LIPASE, AMYLASE in the last 168 hours. No results for input(s): AMMONIA in the last 168 hours. CBC: Recent Labs  Lab 04/08/19 2249 04/09/19 0523 04/10/19 1004 04/11/19 0340 04/12/19 0341  WBC 8.9 6.4 6.0 5.8 6.7  NEUTROABS 6.3 4.4 4.4 3.8 4.8  HGB 13.8 13.9 15.0 14.0 14.1  HCT 44.0  44.2 47.3 45.0 45.0  MCV 92.4 93.2 92.0 92.2 92.2  PLT 125* 114* 126* 141* 145*   Cardiac Enzymes: No results for input(s): CKTOTAL, CKMB, CKMBINDEX, TROPONINI in the last 168 hours. BNP: Invalid input(s): POCBNP CBG: Recent Labs  Lab 04/10/19 1604  GLUCAP 138*   D-Dimer Recent Labs    04/11/19 1109 04/12/19 0341  DDIMER 1.89* 1.37*   Hgb A1c No results for input(s): HGBA1C in the last 72 hours. Lipid Profile No results for input(s): CHOL, HDL, LDLCALC, TRIG, CHOLHDL, LDLDIRECT in the last 72 hours. Thyroid function studies No results for input(s): TSH, T4TOTAL, T3FREE, THYROIDAB in the last 72 hours.  Invalid input(s): FREET3 Anemia work up No results for input(s): VITAMINB12, FOLATE, FERRITIN, TIBC, IRON, RETICCTPCT in the last 72 hours. Urinalysis    Component Value Date/Time   COLORURINE YELLOW 04/09/2019 0319   APPEARANCEUR CLEAR 04/09/2019 0319   LABSPEC 1.025 04/09/2019 0319   PHURINE 5.0 04/09/2019 0319   GLUCOSEU NEGATIVE 04/09/2019 0319   HGBUR NEGATIVE 04/09/2019 0319   BILIRUBINUR NEGATIVE 04/09/2019 0319   BILIRUBINUR ++ 11/18/2012 1319   KETONESUR NEGATIVE 04/09/2019 0319   PROTEINUR 30 (A) 04/09/2019 0319   UROBILINOGEN 0.2 08/04/2014 1620   NITRITE NEGATIVE 04/09/2019 0319   LEUKOCYTESUR NEGATIVE 04/09/2019 0319   Sepsis Labs Invalid input(s): PROCALCITONIN,  WBC,  LACTICIDVEN Microbiology Recent Results (from the past 240 hour(s))  Blood Culture (routine x 2)     Status: None (Preliminary result)   Collection Time: 04/08/19 10:49 PM   Specimen: BLOOD  Result Value Ref  Range Status   Specimen Description   Final    BLOOD RIGHT ANTECUBITAL Performed at [facility] [address], [place], [place] [address]    Special Requests   Final    BOTTLES DRAWN AEROBIC AND ANAEROBIC Blood Culture results may not be optimal due to an excessive volume of blood received in culture bottles Performed at [facility] [address] [place], [place] [address]    Culture   Final    NO GROWTH 4 DAYS Performed at [facility], [place] [address], [place], [place] [address]    Report Status PENDING  Incomplete  Blood Culture (routine x 2)     Status: None (Preliminary result)   Collection Time: 04/08/19 10:49 PM   Specimen: BLOOD  Result Value Ref Range Status   Specimen Description   Final    BLOOD BLOOD RIGHT HAND Performed at [facility] [address], [place], [place] [address]    Special Requests   Final    BOTTLES DRAWN AEROBIC AND ANAEROBIC Blood Culture results may not be optimal due to an excessive volume of blood received in culture bottles Performed at [facility] [address], [place], [place] [address]    Culture   Final    NO GROWTH 4 DAYS Performed at [facility], [place] [address] [place], [place] [address]    Report Status PENDING  Incomplete  Urine culture     Status: Abnormal   Collection Time: 04/09/19  3:19 AM   Specimen: In/Out Cath Urine  Result Value Ref Range Status   Specimen Description   Final    IN/OUT CATH URINE Performed at [facility] [address], [place], [place] [address]    Special Requests   Final    NONE Performed at [facility], [place] [address] [place], [place] [address]    Culture MULTIPLE SPECIES PRESENT, SUGGEST RECOLLECTION (A)  Final   Report Status 04/10/2019 FINAL  Final     Time coordinating discharge: Over 30 minutes  SIGNED:   Desiree Hane, MD  [facility] 04/13/2019, 12:39 PM Pager   If 7PM-7AM, please contact night-coverage [email or web address] Password [email or web address]

## 2019-04-13 NOTE — Evaluation (Signed)
Physical Therapy One Time Evaluation Patient Details Name: [name] MRN: [id number] DOB: 04-25-1940 Today's Date: 04/13/2019   History of Present Illness  79 y.o. 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  He lives with his daughter who was diagnosed with COVID on 2/27 about a week ago who presented on 04/08/2019 with days of congestion, headache, cough productive of green sputum, and fever.  Pt admitted for Acute on chronic hypoxic respiratory failure secondary to Covid -19 pneumonia in patient with severe COPD, improving  Clinical Impression  Patient evaluated by Physical Therapy with no further acute PT needs identified. All education has been completed and the patient has no further questions.  See below for any follow-up Physical Therapy or equipment needs. PT is signing off. Thank you for this referral.  Pt up in bathroom without oxygen tubing in place on arrival.  Pt typically wears 4L O2 [place] at home so reminded pt to leave nasal cannula in place.  Pt did reports a little SOB with mobilizing around room however SPO2 91%.  Pt reports feeling much better since admission however educated to take rest breaks at home as needed.  Pt states he is discharging home today and awaiting his ride.  Pt plans to d/c home with family since he lives alone in [place].     Follow Up Recommendations No PT follow up    Equipment Recommendations  None recommended by PT    Recommendations for Other Services       Precautions / Restrictions Precautions Precautions: Fall Precaution Comments: chronic 4L O2      Mobility  Bed Mobility Overal bed mobility: Modified Independent                Transfers Overall transfer level: Modified independent               General transfer comment: pt in bathroom on arrival, pt without  oxygen in place, SPO2 91%, pt reapplied 4L O2 [place] upon return to bed and SPO2 96%  Ambulation/Gait Ambulation/Gait assistance: Supervision Gait Distance (Feet): 20 Feet Assistive device: None Gait Pattern/deviations: WFL(Within Functional Limits)     General Gait Details: pt ambulated around his room, pt encouraged to keep O2 [place] in place while in room  Stairs            Wheelchair Mobility    Modified Rankin (Stroke Patients Only)       Balance Overall balance assessment: No apparent balance deficits (not formally assessed)(denies hx of falls)                                           Pertinent Vitals/Pain Pain Assessment: No/denies pain    Home Living Family/patient expects to be discharged to:: Private residence Living Arrangements: Alone   Type of Home: House Home Access: Stairs to enter   [employer] of Steps: 1 Home Layout: One level Home Equipment: Environmental consultant - 4 wheels;Walker - 2 wheels Additional Comments: lift chair    Prior Function Level of Independence: Independent               Hand Dominance        Extremity/Trunk Assessment        Lower Extremity Assessment Lower Extremity Assessment: Overall [facility]  for tasks assessed       Communication   Communication: HOH  Cognition Arousal/Alertness: Awake/alert Behavior During Therapy: WFL for tasks assessed/performed Overall Cognitive Status: Within Functional Limits for tasks assessed                                        General Comments      Exercises     Assessment/Plan    PT Assessment Patent does not need any further PT services  PT Problem List         PT Treatment Interventions      PT Goals (Current goals can be found in the Care Plan section)  Acute Rehab PT Goals PT Goal Formulation: All assessment and education complete, DC therapy    Frequency     Barriers to discharge        Co-evaluation                AM-PAC PT "6 Clicks" Mobility  Outcome Measure Help needed turning from your back to your side while in a flat bed without using bedrails?: None Help needed moving from lying on your back to sitting on the side of a flat bed without using bedrails?: None Help needed moving to and from a bed to a chair (including a wheelchair)?: None Help needed standing up from a chair using your arms (e.g., wheelchair or bedside chair)?: None Help needed to walk in hospital room?: A Little Help needed climbing 3-5 steps with a railing? : A Little 6 Click Score: 22    End of Session Equipment Utilized During Treatment: Oxygen Activity Tolerance: Patient tolerated treatment well Patient left: with call bell/phone within reach;in bed Nurse Communication: Mobility status PT Visit Diagnosis: Difficulty in walking, not elsewhere classified (R26.2)    Time: 3419-3790 PT Time Calculation (min) (ACUTE ONLY): 18 min   Charges:   PT Evaluation $PT Eval Low Complexity: 1 Low         [name],KATHrine E 04/13/2019, 2:34 PM Jannette Spanner PT, DPT Acute Rehabilitation Services Office: [phone]

## 2019-04-14 LAB — CULTURE, BLOOD (ROUTINE X 2)
Culture: NO GROWTH
Culture: NO GROWTH

## 2019-04-16 NOTE — Telephone Encounter (Signed)
Noted  

## 2019-04-16 NOTE — Telephone Encounter (Signed)
[name], he should be taking 3 capsules 3 times a day. I'm not sure why he is doing differently unless he was told by [facility] to do so.

## 2019-04-16 NOTE — Telephone Encounter (Signed)
FYI Left a detailed message with instructions for pt.

## 2019-04-20 ENCOUNTER — Telehealth: Payer: Self-pay | Admitting: Family Medicine

## 2019-04-20 ENCOUNTER — Ambulatory Visit (INDEPENDENT_AMBULATORY_CARE_PROVIDER_SITE_OTHER): Payer: Medicare Other | Admitting: Family Medicine

## 2019-04-20 ENCOUNTER — Other Ambulatory Visit: Payer: Self-pay

## 2019-04-20 VITALS — BP 136/62 | Temp 97.1°F

## 2019-04-20 DIAGNOSIS — J9601 Acute respiratory failure with hypoxia: Secondary | ICD-10-CM | POA: Diagnosis not present

## 2019-04-20 DIAGNOSIS — U071 COVID-19: Secondary | ICD-10-CM | POA: Diagnosis not present

## 2019-04-20 DIAGNOSIS — J189 Pneumonia, unspecified organism: Secondary | ICD-10-CM

## 2019-04-20 DIAGNOSIS — J441 Chronic obstructive pulmonary disease with (acute) exacerbation: Secondary | ICD-10-CM | POA: Diagnosis not present

## 2019-04-20 DIAGNOSIS — R0902 Hypoxemia: Secondary | ICD-10-CM | POA: Diagnosis not present

## 2019-04-20 MED ORDER — AZITHROMYCIN 250 MG PO TABS: ORAL_TABLET | ORAL | 0 refills | Status: DC

## 2019-04-20 NOTE — Progress Notes (Signed)
Subjective:    Patient ID: [name], male    DOB: Dec 16, 1940, 79 y.o.   MRN: [id number]  Fever  This is a new problem. Episode onset: decreased appetite started Thursday. Associated symptoms include congestion. Pertinent negatives include no chest pain, coughing, ear pain, nausea, vomiting or wheezing. Associated symptoms comments: Will not get out of bed; 102.3 temp; not eating well (ate toast and drank water yesterday); not urinating well; pt daughter has called ambulance but pt refuses to go.   Pt and pt husband are going to get rechecked for COVID. The patient was in the hospital for Covid.  Was released.  Is having some spells of fever and some spells of nonbloody uterine drainage in addition to this states he finds himself feeling fatigued and tired and rundown denies being short of breath earlier when they checked his oxygen saturation it was in the 70s but then when they got him sitting up in a chair up was 93 his blood pressure is good temperature currently 98 earlier today it was 102 no vomiting Was on prednisone but they stopped that after having some bloody stools will be following up with a specialist  Pt daughter called last week and states he bleeding from rectum; pt advised to go to ER. Ambulance was called to home; pt refused.  Virtual Visit via Video Note  I connected with [name] on 04/20/19 at 11:00 AM EDT by a video enabled telemedicine application and verified that I am speaking with the correct person using two identifiers.  Location: Patient: home Provider: office   I discussed the limitations of evaluation and management by telemedicine and the availability of in person appointments. The patient expressed understanding and agreed to proceed.  History of Present Illness:    Observations/Objective:   Assessment and Plan:   Follow Up Instructions:    I discussed the assessment and treatment plan with the patient. The patient was provided  an opportunity to ask questions and all were answered. The patient agreed with the plan and demonstrated an understanding of the instructions.   The patient was advised to call back or seek an in-person evaluation if the symptoms worsen or if the condition fails to improve as anticipated.  I provided 18 minutes of non-face-to-face time during this encounter.        Review of Systems  Constitutional: Positive for fever. Negative for activity change and chills.  HENT: Positive for congestion. Negative for ear pain and rhinorrhea.   Eyes: Negative for discharge.  Respiratory: Negative for cough and wheezing.   Cardiovascular: Negative for chest pain.  Gastrointestinal: Negative for nausea and vomiting.  Musculoskeletal: Negative for arthralgias.       Objective:   Physical Exam Patient had virtual visit Appears to be in no distress Atraumatic Neuro able to relate and oriented No apparent resp distress Color normal Face-to-face via video was done today. Patient would benefit from a home health coming and checking in on him and family would benefit from this as well I would recommend CBC metabolic 7 as follow-up from hospitalization but if patient refuses I would not press the matter       Assessment & Plan:  Possible secondary pneumonia from [place] with azithromycin Warning signs of when to go to the ER discussed Patient is fatigued does not want to go to the ER any longer.  Family states they can do the best they can at home Will consult home health to see  patient I would recommend CBC admit 7 but if he refuses I understand Continue oxygen.

## 2019-04-20 NOTE — Telephone Encounter (Signed)
Nurses Please touch base with patient family on Wednesday to see how he is doing He was seen today for probable secondary pneumonia from [place] was consulted today it could take several days before they become involved  Specifically I would like to know how the patient is doing with eating drinking O2 saturations  Patient did not want to go to the ER on Monday

## 2019-04-20 NOTE — Addendum Note (Signed)
Addended by: Vicente Males on: 04/20/2019 01:24 PM   Modules accepted: Orders

## 2019-04-22 ENCOUNTER — Encounter (HOSPITAL_COMMUNITY): Payer: Self-pay

## 2019-04-22 ENCOUNTER — Emergency Department (HOSPITAL_COMMUNITY): Payer: Medicare Other

## 2019-04-22 ENCOUNTER — Emergency Department (HOSPITAL_COMMUNITY)
Admission: EM | Admit: 2019-04-22 | Discharge: 2019-04-22 | Disposition: A | Discharge status: Home / Self Care | Payer: Medicare Other | Attending: Emergency Medicine | Admitting: Emergency Medicine

## 2019-04-22 ENCOUNTER — Other Ambulatory Visit: Payer: Self-pay

## 2019-04-22 DIAGNOSIS — J449 Chronic obstructive pulmonary disease, unspecified: Secondary | ICD-10-CM | POA: Diagnosis not present

## 2019-04-22 DIAGNOSIS — I2510 Atherosclerotic heart disease of native coronary artery without angina pectoris: Secondary | ICD-10-CM | POA: Diagnosis not present

## 2019-04-22 DIAGNOSIS — Z79899 Other long term (current) drug therapy: Secondary | ICD-10-CM | POA: Insufficient documentation

## 2019-04-22 DIAGNOSIS — U071 COVID-19: Secondary | ICD-10-CM

## 2019-04-22 DIAGNOSIS — Z743 Need for continuous supervision: Secondary | ICD-10-CM | POA: Diagnosis not present

## 2019-04-22 DIAGNOSIS — R531 Weakness: Secondary | ICD-10-CM | POA: Diagnosis present

## 2019-04-22 DIAGNOSIS — R0602 Shortness of breath: Secondary | ICD-10-CM | POA: Diagnosis not present

## 2019-04-22 DIAGNOSIS — I110 Hypertensive heart disease with heart failure: Secondary | ICD-10-CM | POA: Diagnosis not present

## 2019-04-22 DIAGNOSIS — I491 Atrial premature depolarization: Secondary | ICD-10-CM | POA: Diagnosis not present

## 2019-04-22 DIAGNOSIS — Z87891 Personal history of nicotine dependence: Secondary | ICD-10-CM | POA: Diagnosis not present

## 2019-04-22 DIAGNOSIS — J45909 Unspecified asthma, uncomplicated: Secondary | ICD-10-CM | POA: Diagnosis not present

## 2019-04-22 DIAGNOSIS — Z85828 Personal history of other malignant neoplasm of skin: Secondary | ICD-10-CM | POA: Insufficient documentation

## 2019-04-22 DIAGNOSIS — I440 Atrioventricular block, first degree: Secondary | ICD-10-CM | POA: Diagnosis not present

## 2019-04-22 DIAGNOSIS — I5032 Chronic diastolic (congestive) heart failure: Secondary | ICD-10-CM | POA: Diagnosis not present

## 2019-04-22 DIAGNOSIS — Z7982 Long term (current) use of aspirin: Secondary | ICD-10-CM | POA: Diagnosis not present

## 2019-04-22 DIAGNOSIS — S299XXA Unspecified injury of thorax, initial encounter: Secondary | ICD-10-CM | POA: Diagnosis not present

## 2019-04-22 LAB — COMPREHENSIVE METABOLIC PANEL
ALT: 17 U/L (ref 0–44)
AST: 27 U/L (ref 15–41)
Albumin: 2.8 g/dL — ABNORMAL LOW (ref 3.5–5.0)
Alkaline Phosphatase: 67 U/L (ref 38–126)
Anion gap: 9 (ref 5–15)
BUN: 18 mg/dL (ref 8–23)
CO2: 30 mmol/L (ref 22–32)
Calcium: 9.1 mg/dL (ref 8.9–10.3)
Chloride: 100 mmol/L (ref 98–111)
Creatinine, Ser: 1.14 mg/dL (ref 0.61–1.24)
GFR calc Af Amer: >60 mL/min (ref >60)
GFR calc non Af Amer: >60 mL/min (ref >60)
Glucose, Bld: 132 mg/dL — ABNORMAL HIGH (ref 70–99)
Potassium: 3.1 mmol/L — ABNORMAL LOW (ref 3.5–5.1)
Sodium: 139 mmol/L (ref 135–145)
Total Bilirubin: 0.9 mg/dL (ref 0.3–1.2)
Total Protein: 7.2 g/dL (ref 6.5–8.1)

## 2019-04-22 LAB — URINALYSIS, ROUTINE W REFLEX MICROSCOPIC
Bacteria, UA: NONE SEEN
Bilirubin Urine: NEGATIVE
Glucose, UA: NEGATIVE mg/dL
Hgb urine dipstick: NEGATIVE
Ketones, ur: NEGATIVE mg/dL
Leukocytes,Ua: NEGATIVE
Nitrite: NEGATIVE
Protein, ur: 30 mg/dL — AB
Specific Gravity, Urine: 1.02 (ref 1.005–1.030)
pH: 6 (ref 5.0–8.0)

## 2019-04-22 LAB — CBC WITH DIFFERENTIAL/PLATELET
Abs Immature Granulocytes: 0.06 10*3/uL (ref 0.00–0.07)
Basophils Absolute: 0 10*3/uL (ref 0.0–0.1)
Basophils Relative: 0 %
Eosinophils Absolute: 0.1 10*3/uL (ref 0.0–0.5)
Eosinophils Relative: 1 %
HCT: 41.9 % (ref 39.0–52.0)
Hemoglobin: 13.3 g/dL (ref 13.0–17.0)
Immature Granulocytes: 1 %
Lymphocytes Relative: 13 %
Lymphs Abs: 1.3 10*3/uL (ref 0.7–4.0)
MCH: 29.2 pg (ref 26.0–34.0)
MCHC: 31.7 g/dL (ref 30.0–36.0)
MCV: 91.9 fL (ref 80.0–100.0)
Monocytes Absolute: 0.8 10*3/uL (ref 0.1–1.0)
Monocytes Relative: 8 %
Neutro Abs: 8.1 10*3/uL — ABNORMAL HIGH (ref 1.7–7.7)
Neutrophils Relative %: 77 %
Platelets: 250 10*3/uL (ref 150–400)
RBC: 4.56 MIL/uL (ref 4.22–5.81)
RDW: 13.7 % (ref 11.5–15.5)
WBC: 10.3 10*3/uL (ref 4.0–10.5)
nRBC: 0 % (ref 0.0–0.2)

## 2019-04-22 LAB — BRAIN NATRIURETIC PEPTIDE: B Natriuretic Peptide: 98.1 pg/mL (ref 0.0–100.0)

## 2019-04-22 MED ORDER — SODIUM CHLORIDE 0.9 % IV BOLUS: 500.0000 mL | Freq: Once | INTRAVENOUS | Status: DC

## 2019-04-22 MED ORDER — ALBUTEROL SULFATE HFA 108 (90 BASE) MCG/ACT IN AERS
2.0000 | INHALATION_SPRAY | Freq: Four times a day (QID) | RESPIRATORY_TRACT | Status: DC
  Administered 2019-04-22 (×2): 2 via RESPIRATORY_TRACT
  Filled 2019-04-22: qty 6.7

## 2019-04-22 MED ORDER — POTASSIUM CHLORIDE IN NACL 20-0.9 MEQ/L-% IV SOLN
Freq: Once | INTRAVENOUS | Status: AC
  Filled 2019-04-22: qty 1000

## 2019-04-22 MED ORDER — CLOTRIMAZOLE 1 % EX CREA: TOPICAL_CREAM | CUTANEOUS | 0 refills | Status: DC

## 2019-04-22 NOTE — ED Notes (Signed)
Requested UA from pt. Pt states unable to void at this time. Pt provided urinal.

## 2019-04-22 NOTE — Telephone Encounter (Signed)
Daughter just called and said he did go to hospital.

## 2019-04-22 NOTE — Telephone Encounter (Signed)
Patient will be going to [facility] or [facility] for further evaluation

## 2019-04-22 NOTE — ED Notes (Signed)
Call pt daughter and she stated they would come get PT.

## 2019-04-22 NOTE — Telephone Encounter (Signed)
Dr Nicki Reaper spoke with daughter [name]

## 2019-04-22 NOTE — ED Notes (Addendum)
PT able to stand and walk with assistance.  PT Family picked him up and this RN assisted pt in to vehicle pt and family verbalized discharge instructions and denies any further questions.

## 2019-04-22 NOTE — Discharge Instructions (Addendum)
As discussed, you are still recovering from your coronavirus infection.  It is very important that you stay well-hydrated, drink any fluids, eat plenty of food and monitor your condition carefully.  Do not hesitate to return here if you develop any new, or concerning changes in your condition.

## 2019-04-22 NOTE — ED Triage Notes (Signed)
EMS reports from home, called out to [name] (who called EMS for O2 need) for fall, slid to floor, no LOC, no blood thinners, no obvious injury, denies pain, family also concerned due to generalized weakness and recet lack of appetite. Dx with Covid 2/28.  BP 145/88 HR 96 RR 30 Sp02 94 @ 3ltrs nasal cannula. (24-7 on 4ltrs @ home) CBG 138

## 2019-04-22 NOTE — ED Provider Notes (Signed)
[facility] DEPT Provider Note   CSN: [id number] Arrival date & time: 04/22/19  1137     History Chief Complaint  Patient presents with  . Fall  . Weakness  . Failure To Thrive    [name] is a 79 y.o. male.  HPI   Patient presents from home with concern of weakness.  Concern is related by family members, per notes.  The patient himself denies complaints including new dyspnea, new weakness, new pain.  He does have a history of coronavirus infection, diagnosed 2 weeks ago, and recent additional diagnosis of pneumonia for which he is taking azithromycin.  Beyond this he denies knowledge of other medication change. Per chart review it seems as though the patient has been progressively weak over the past few days, with increasing anorexia.  No report of new fever, vomiting.  There is a questionable history of fall, though this is denied by the patient. Past Medical History:  Diagnosis Date  . Ankylosing spondylitis ([address]) 11/07/2018  . Asthma   . BPH (benign prostatic hyperplasia)   . CAD (coronary artery disease) 01/1995  . Clostridium difficile colitis 04/2005  . Colitis 2011  . COPD (chronic obstructive pulmonary disease) ([place])   . Depression   . Diverticulosis   . DJD (degenerative joint disease)   . Gastric ulcer 04/17/10   Three 85m gastric ulcers, H.pylori serologies were negative  . GERD (gastroesophageal reflux disease)   . History of kidney stones   . Hyperlipidemia   . Hypertension   . Idiopathic chronic inflammatory bowel disease 05/18/2010   left-sided UC  . Kidney stone   . Morbid obesity ([facility] 03/12/2018  . Obstructive sleep apnea    on Cpap  . Reflux 02/1995  . S/P endoscopy 07/24/10   retained gastric contents, benign bx    Patient Active Problem List   Diagnosis Date Noted  . Pneumonia due to COVID-19 virus 04/09/2019  . Shortness of breath 04/09/2019  . COPD (chronic obstructive pulmonary disease) ([facility] 04/09/2019    . Chronic ulcerative proctosigmoiditis, without complications ([facility] 045/80/9983 . Chronic respiratory failure with hypoxia ([place] 04/09/2019  . Obesity with body mass index (BMI) of 30.0 to 39.9 04/09/2019  . Acute on chronic respiratory failure with hypoxia and hypercapnia ([facility] [place] 12/23/2018  . COPD with respiratory failure, acute ([facility] 12/23/2018  . Respiratory failure ([facility] 12/22/2018  . Acute respiratory failure with hypoxia ([facility] 12/21/2018  . Ankylosing spondylitis ([place] 11/07/2018  . Rectal bleeding 08/26/2018  . Chronic diastolic CHF (congestive heart failure) ([facility] 03/27/2018  . Exertional dyspnea 03/15/2018  . Dyspnea 03/15/2018  . Morbid obesity ([facility] 03/12/2018  . History of carpal tunnel surgery of left wrist with unlar head resection 01/17/17 01/24/2017  . Closed fracture of distal radius and ulna, left, with malunion, subsequent encounter   . Carpal tunnel syndrome of left wrist   . Fracture, [place], left, closed 11/29/2016  . Hx of skin cancer, basal cell 11/30/2015  . Erectile dysfunction 06/06/2015  . Iron deficiency 08/18/2014  . Fecal incontinence 02/10/2014  . Essential hypertension 01/09/2013  . Dyslipidemia, goal LDL below 70 01/09/2013  . Chest pain at rest 12/14/2012  . CAD S/P percutaneous coronary angioplasty 12/14/2012  . Hiatal hernia 12/14/2012  . GERD (gastroesophageal reflux disease) 12/14/2012  . Prediabetes 12/09/2012  . BPH (benign prostatic hyperplasia) 11/18/2012  . RUQ pain 09/04/2012  . Gallstones 05/09/2012  . COPD with acute exacerbation ([facility] 04/30/2012  . Obstructive sleep apnea 04/30/2012  .  Ulcerative colitis ([place]) 04/29/2012  . Ulcerative colitis, left sided ([place]) 12/26/2011  . Diarrhea 10/18/2010    Past Surgical History:  Procedure Laterality Date  . ANORECTAL MANOMETRY  2016   [facility]: concern for possible fissure. Noted pelvic floor dyssnyergy  . BIOPSY  07/29/2017   Procedure: BIOPSY;  Surgeon: Daneil Dolin, MD;  Location:  [facility];  Service: Endoscopy;;  ascending and sigmoid colon  . CARDIAC CATHETERIZATION     with stent  . CARDIOVASCULAR STRESS TEST  07/21/2009   No scintigraphic evidence of inducible myocardial ischemia  . CARPAL TUNNEL RELEASE Left 01/17/2017   Procedure: LEFT CARPAL TUNNEL RELEASE;  Surgeon: Carole Civil, MD;  Location: [facility] ORS;  Service: Orthopedics;  Laterality: Left;  . CATARACT EXTRACTION, BILATERAL Bilateral   . CERVICAL SPINE SURGERY     C4-5  . COLONOSCOPY  04/2005   granularity and friability erosions from rectum to 40cm. Bx infection vs IBD. C. Diff positive at the time.   . COLONOSCOPY  05/2010   [facility]: left-sided UC, bx with no dysplasia, shallow diverticula  . COLONOSCOPY [name] 11/07/2012   CVE:LFYB-[id number] proctocolitis status post segmental biopsy/Sigmoid colon polyps removed as described above. Procedure compromisd by technical difficulties. bx: Inflammation limited to sigmoid and rectum on pathology.  . COLONOSCOPY  04/2014   Dr[facility]: well localized proctocolitis limited to sigmoid  . COLONOSCOPY WITH PROPOFOL [name] 07/29/2017   diverticulosis in colon, three 4-6 mm polyps at splenic flexure and in cecum, one 10 mm polyp in rectum, abnormal rectum and sigmoid consistent with active UC  . CORONARY STENT PLACEMENT  01/1995  . ESOPHAGOGASTRODUODENOSCOPY  07/24/2010   [facility]:ENIDPO esophagus  . ESOPHAGOGASTRODUODENOSCOPY  04/2014   Dr. Nyoka Cowden at [facility]: negative small bowel biopsies  . FOOT SURGERY Bilateral two  . KNEE SURGERY  two  . POLYPECTOMY  07/29/2017   Procedure: POLYPECTOMY;  Surgeon: Daneil Dolin, MD;  Location: [facility] SU[facility];  Service: Endoscopy;;  splenic flexure, ascending colon polyp;rectal  . SHOULDER SURGERY  two  . TONSILLECTOMY    . TRANSTHORACIC ECHOCARDIOGRAM  03/23/2009   EF 60-65%, normal LV systolic function  . ULNAR HEAD EXCISION Left 01/17/2017   Procedure: LEFT ULNAR HEAD RESECTION;  Surgeon: Carole Civil, MD;  Location:  [facility] ORS;  Service: Orthopedics;  Laterality: Left;       Family History  Problem Relation Age of Onset  . Lung cancer Mother   . Cancer Mother        breast  . Diabetes Mother   . Stroke Father   . Hypertension Father   . Kidney failure Brother   . Other Child        blood infection  . Colon cancer Neg Hx     Social History   Tobacco Use  . Smoking status: Former Smoker    Packs/day: 1.00    Years: 20.00    Pack years: 20.00    Types: Cigarettes    Quit date: 09/30/1969    Years since quitting: 49.5  . Smokeless tobacco: Never Used  Substance Use Topics  . Alcohol use: No  . Drug use: No    Home Medications Prior to Admission medications   Medication Sig Start Date End Date Taking? Authorizing Provider  acetaminophen (TYLENOL) 325 MG tablet Take 650 mg by mouth every 6 (six) hours as needed for moderate pain or headache.    [provider]  albuterol (VENTOLIN HFA) 108 (90 Base) MCG/ACT inhaler  Inhale 2 puffs into the lungs every 6 (six) hours as needed for wheezing or shortness of breath. 12/23/18   Sheth, Devam P, MD  amLODipine (NORVASC) 2.5 MG tablet Take 1 tablet (2.5 mg total) by mouth every morning. 11/06/18   [name], MD  aspirin 81 MG tablet Take 1 tablet (81 mg total) by mouth at bedtime. 03/18/18   Patrecia Pour, MD  azithromycin (ZITHROMAX Z-PAK) 250 MG tablet Take 2 tablets (500 mg) on  Day 1,  followed by 1 tablet (250 mg) once daily on Days 2 through 5. 04/20/19   Luking, Elayne Snare, MD  balsalazide (COLAZAL) 750 MG capsule Take 3 capsules (2,250 mg total) by mouth 3 (three) times daily. 04/16/19 07/15/19  Annitta Needs, NP  benzonatate (TESSALON) 200 MG capsule Take 1 capsule (200 mg total) by mouth 2 (two) times daily as needed for cough. 04/13/19   Desiree Hane, MD  clonazePAM (KLONOPIN) 0.5 MG tablet Take 1/2-1 tablet po BID PRN agitation. Caution:drowsiness; use sparingly 12/23/18   [name], MD  doxazosin (CARDURA) 4 MG tablet Take one  tablet by mouth at bedtime Patient taking differently: Take 4 mg by mouth at bedtime.  11/06/18   [name], MD  furosemide (LASIX) 40 MG tablet Take 1 tablet (40 mg total) by mouth every morning. 12/23/18   Sheth, Vickii Chafe, MD  losartan (COZAAR) 25 MG tablet TAKE 1 TABLET BY MOUTH ONCE DAILY IN THE MORNING Patient taking differently: Take 25 mg by mouth daily.  01/19/19   [name], MD  metoprolol tartrate (LOPRESSOR) 50 MG tablet One po QHS Patient taking differently: Take 50 mg by mouth at bedtime.  11/06/18   [name], MD  mupirocin ointment (BACTROBAN) 2 % Apply thin layer to affected area once a day 11/06/18   [name], MD  nitroGLYCERIN (NITROSTAT) 0.4 MG SL tablet Place 0.4 mg under the tongue every 5 (five) minutes as needed for chest pain.    [provider]  pravastatin (PRAVACHOL) 40 MG tablet Take 1 tablet (40 mg total) by mouth at bedtime. 11/24/18   Lendon Colonel, NP  predniSONE (DELTASONE) 10 MG tablet Take 3 tablets (30 mg total) by mouth See admin instructions. 40 MG x7 days, 35 MG x 7 days, 30 MG x 7 days 25 MG x7 day, 20 MG x7 days, then 15 MG x 7 days, then 10 MG x 7 days, then 5 mg x 7 days until your taper is complete in 8 weeks 04/13/19   Oretha Milch D, MD  sertraline (ZOLOFT) 100 MG tablet Take one tablet daily 12/24/18   [name], MD  tiotropium (SPIRIVA HANDIHALER) 18 MCG inhalation capsule One inhalation po daily 11/06/18   [name], MD  triamcinolone cream (KENALOG) 0.1 % Apply twice daily as needed for psoriasis. 04/22/18   [name], MD  Vitamin D, Ergocalciferol, (DRISDOL) 1.25 MG (50000 UNIT) CAPS capsule Take 50,000 Units by mouth every 7 (seven) days. mondays    [provider]    Allergies    Patient has no known allergies.  Review of Systems   Review of Systems  Constitutional:       Per HPI, otherwise negative  HENT:       Per HPI, otherwise negative  Respiratory:       Patient is on 24/7  oxygen at home, this amount is unchanged  Cardiovascular:  Per HPI, otherwise negative  Gastrointestinal: Negative for vomiting.  Endocrine:       Negative aside from HPI  Genitourinary:       Neg aside from HPI   Musculoskeletal:       Per HPI, otherwise negative  Skin: Negative.   Neurological: Positive for weakness. Negative for syncope.    Physical Exam Updated Vital Signs BP (!) 123/59   Pulse 76   Temp 99.3 F (37.4 C) (Oral)   Resp (!) 30   SpO2 92%   Physical Exam Vitals and nursing note reviewed.  Constitutional:      General: He is not in acute distress.    Appearance: He is well-developed. He is ill-appearing.     Comments: Deconditioned elderly male awake and alert, in no distress receiving supplemental oxygen via nasal cannula.  HENT:     Head: Normocephalic and atraumatic.  Eyes:     Conjunctiva/sclera: Conjunctivae normal.  Cardiovascular:     Rate and Rhythm: Normal rate and regular rhythm.  Pulmonary:     Effort: Pulmonary effort is normal. No respiratory distress.     Breath sounds: No stridor.  Abdominal:     General: There is no distension.  Skin:    General: Skin is warm and dry.  Neurological:     Mental Status: He is alert and oriented to person, place, and time.     ED Results / Procedures / Treatments   Labs (all labs ordered are listed, but only abnormal results are displayed) Labs Reviewed  COMPREHENSIVE METABOLIC PANEL - Abnormal; Notable for the following components:      Result Value   Potassium 3.1 (*)    Glucose, Bld 132 (*)    Albumin 2.8 (*)    All other components within normal limits  CBC WITH DIFFERENTIAL/PLATELET - Abnormal; Notable for the following components:   Neutro Abs 8.1 (*)    All other components within normal limits  BRAIN NATRIURETIC PEPTIDE  URINALYSIS, ROUTINE W REFLEX MICROSCOPIC    EKG EKG Interpretation  Date/Time:  Wednesday April 22 2019 12:21:19 EDT Ventricular Rate:  85 PR Interval:      QRS Duration: 105 QT Interval:  367 QTC Calculation: 437 R Axis:   -26 Text Interpretation: Sinus rhythm Borderline left axis deviation Abnormal R-wave progression, late transition T wave abnormality Abnormal ECG Confirmed by Carmin Muskrat [phone]) on 04/22/2019 1:00:36 PM   Radiology DG Chest Port 1 View  Result Date: 04/22/2019 CLINICAL DATA:  EMS reports from home, called out to [facility] (who called EMS for O2 need) for fall, slid to floor, no LOC, no blood thinners, no obvious injury, denies pain, family also concerned due to generalized weakness and recet lack of appetite. Dx with Covid 2/28. EXAM: PORTABLE CHEST 1 VIEW COMPARISON:  04/10/2019 FINDINGS: Cardiac silhouette mildly enlarged.  No mediastinal or hilar masses. There are prominent bronchovascular/interstitial markings. Left lung base is not well assessed due to the semi-erect AP technique, rotation to the left and the overlying cardiac silhouette. Allowing for this, the lungs are otherwise clear. No convincing right pleural effusion.  No pneumothorax. Skeletal structures are grossly intact. IMPRESSION: No acute cardiopulmonary disease. Electronically Signed   By: Lajean Manes M.D.   On: 04/22/2019 12:44    Procedures Procedures (including critical care time)  Medications Ordered in ED Medications  albuterol (VENTOLIN HFA) 108 (90 Base) MCG/ACT inhaler 2 puff (has no administration in time range)  0.9 % NaCl with KCl 20 mEq/ L  infusion (has no administration in time range)    ED Course  I have reviewed the triage vital signs and the nursing notes.  Pertinent labs & imaging results that were available during my care of the patient were reviewed by me and considered in my medical decision making (see chart for details).  4:02 PM Patient reassessed, states that he feels fine. I discussed findings thus far with the patient's daughter.  We also discussed the patient's recent diagnosis of coronavirus, his ongoing conversations  with his physician in regards to weakness, anorexia.  She relates concern of possible issues that he is not conveying, including heel pain, right-sided, and possibly groin pain.  On repeat exam each of these areas is examined.  The right heel is unremarkable, no tenderness palpation, no inability to flex or extend the ankle, no complaints by the patient. Regarding has some superficial changes consistent with yeast infection, otherwise no deformity, no swelling.  Subsequently spoke with the patient about options for additional therapy given his recent diagnosis of coronavirus, concern for his weakness, anorexia, though his initial findings are reassuring.  I offered assistance with placement into a rehabilitation facility.  Patient has a preference for discharge with home care. 5:02 PM I spoke with the daughter on speaker phone at the patient's bedside [place] discussed all findings.  I done repeat exam on areas of their concern, as above.  I also discussed with the patient given his persistent discomfort, weakness, falls, indication for admission or if nothing else rehabilitation placement.  Patient is not amenable to either of these possibilities.  He has capacity to make his own decisions.  With family we discussed return precautions should he continue to decline and we offered to facilitate additional options for home health rehab, which I executed.  This elderly male with multiple medical issues including COPD presents after recent Covid infection, now with weakness, family concern of anorexia.  Patient self is awake and alert, oriented appropriately, making his own clear statements, but has mild tachypnea, mild fever.  No evidence for bacteremia, sepsis, but given these concerns, the patient did meet criteria for admission.  However, patient not amenable to this.  Final Clinical Impression(s) / ED Diagnoses Final diagnoses:  Weakness  COVID-19 virus infection      Carmin Muskrat, MD 04/22/19  1718

## 2019-04-22 NOTE — ED Notes (Signed)
NT informed patient that UA needed, offered to help patient states he has a headache come back later.

## 2019-04-22 NOTE — Telephone Encounter (Signed)
Not eating much had half of a hot dog yesterday, drink half a cup of water and maybe a sundrop yesterday and has not had anything today. Not urinating as much as normal. He is in the floor when I called and daughter has called ems. They are waiting for them to get there. They have tried to do 02 but cannot get a reading.

## 2019-04-26 DIAGNOSIS — U071 COVID-19: Secondary | ICD-10-CM | POA: Diagnosis not present

## 2019-04-26 DIAGNOSIS — J9611 Chronic respiratory failure with hypoxia: Secondary | ICD-10-CM | POA: Diagnosis not present

## 2019-04-26 DIAGNOSIS — J189 Pneumonia, unspecified organism: Secondary | ICD-10-CM | POA: Diagnosis not present

## 2019-04-28 DIAGNOSIS — J9611 Chronic respiratory failure with hypoxia: Secondary | ICD-10-CM | POA: Diagnosis not present

## 2019-04-28 DIAGNOSIS — U071 COVID-19: Secondary | ICD-10-CM | POA: Diagnosis not present

## 2019-04-28 DIAGNOSIS — J189 Pneumonia, unspecified organism: Secondary | ICD-10-CM | POA: Diagnosis not present

## 2019-04-29 ENCOUNTER — Encounter: Payer: Self-pay | Admitting: Family Medicine

## 2019-04-29 ENCOUNTER — Ambulatory Visit (INDEPENDENT_AMBULATORY_CARE_PROVIDER_SITE_OTHER): Payer: Medicare Other | Admitting: Family Medicine

## 2019-04-29 ENCOUNTER — Other Ambulatory Visit: Payer: Self-pay

## 2019-04-29 VITALS — BP 106/62 | Temp 98.2°F | Ht 74.0 in | Wt 289.0 lb

## 2019-04-29 DIAGNOSIS — I10 Essential (primary) hypertension: Secondary | ICD-10-CM

## 2019-04-29 DIAGNOSIS — R600 Localized edema: Secondary | ICD-10-CM

## 2019-04-29 DIAGNOSIS — R0609 Other forms of dyspnea: Secondary | ICD-10-CM

## 2019-04-29 DIAGNOSIS — R5383 Other fatigue: Secondary | ICD-10-CM

## 2019-04-29 DIAGNOSIS — R0600 Dyspnea, unspecified: Secondary | ICD-10-CM

## 2019-04-29 DIAGNOSIS — K5190 Ulcerative colitis, unspecified, without complications: Secondary | ICD-10-CM

## 2019-04-29 MED ORDER — METOPROLOL TARTRATE 50 MG PO TABS: ORAL_TABLET | ORAL | 5 refills | Status: DC

## 2019-04-29 MED ORDER — DOXAZOSIN MESYLATE 4 MG PO TABS: ORAL_TABLET | ORAL | 5 refills | Status: DC

## 2019-04-29 NOTE — Progress Notes (Signed)
   Subjective:    Patient ID: [name], male    DOB: May 11, 1940, 79 y.o.   MRN: [id number]  Pt is with daughter [name]. Leg Pain  Incident onset: right leg pain since fall on the 17th.  states it is hard for him to lift up his leg.  Patient with intermittent leg pain in the groin region on the right side patient denies burning or discomfort down the leg Patient has ulcerative colitis was on prednisone but did not tolerate it well supposed to see specialist at [facility] in the near future Patient recently had Covid and Covid pneumonia was in the hospital and now continues to have fatigue tiredness feeling rundown he is eating okay drinking okay but just not as active as he should be. Headaches every day since having covid the beginning of march.   bp was elevated yesterday when home health nurse checked it 200/110 and rechecked it 10 mins later and was 161/52.  Will start occupational therapy tomorrow and should start physical therapy soon.   Family is looking at getting him a caregiver.    Review of Systems     Objective:   Physical Exam Minimal crackles in the left base.  Respiratory rate is normal.  O2 saturation 95% heart regular no murmurs abdomen soft obese right groin region tender no sign of infection testicles appear normal no sign of hernia extremities trace edema on the lower legs  30 minutes spent with patient reviewing hospital records talking with the patient documentation as well as ordering test reducing medications because of dangerous orthostatic hypotension and discussing importance of follow-up in 4 weeks and follow-up sooner problems plus also family to establish MyChart relations to send [place] update early next week      Assessment & Plan:  1. Other fatigue Significant fatigue tiredness I believe part of this is due to the recent Covid infection he had been very hard on him plus also resulting pneumonia but also patient has orthostatic hypotension we are reducing  some of his medicines - Basic metabolic panel - CBC with Differential/Platelet  2. DOE (dyspnea on exertion) With the DOE we will check BNP await the results of this - B Nat Peptide  3. Pedal edema Trace pedal edema continue the Lasix  4. Orthostatic hypertension Stop the amlodipine, stop losartan, reduce doxazosin to a half of a tablet which would equal 2 mg Reduce metoprolol to a half a tablet which is 25 mg Follow-up in 4 weeks  5. Morbid obesity ([place]) Patient to watch portion control try to eat on a regular basis patient at high risk of falling using a walker  6. Ulcerative colitis without complications, unspecified location [facility]) Will be seeing a doctor at [facility] for this in the near future Unfortunately this patient is at high risk of hospitalization he has home health which I highly agree with they are looking at getting him a sitter plus also he will follow up here closely recheck in 1 month

## 2019-04-30 ENCOUNTER — Telehealth: Payer: Self-pay | Admitting: Family Medicine

## 2019-04-30 DIAGNOSIS — J189 Pneumonia, unspecified organism: Secondary | ICD-10-CM | POA: Diagnosis not present

## 2019-04-30 DIAGNOSIS — J9611 Chronic respiratory failure with hypoxia: Secondary | ICD-10-CM | POA: Diagnosis not present

## 2019-04-30 DIAGNOSIS — U071 COVID-19: Secondary | ICD-10-CM | POA: Diagnosis not present

## 2019-04-30 LAB — CBC WITH DIFFERENTIAL/PLATELET
Basophils Absolute: 0.1 10*3/uL (ref 0.0–0.2)
Basos: 1 %
EOS (ABSOLUTE): 0.2 10*3/uL (ref 0.0–0.4)
Eos: 3 %
Hematocrit: 37 % — ABNORMAL LOW (ref 37.5–51.0)
Hemoglobin: 12 g/dL — ABNORMAL LOW (ref 13.0–17.7)
Immature Grans (Abs): 0.1 10*3/uL (ref 0.0–0.1)
Immature Granulocytes: 1 %
Lymphocytes Absolute: 2.1 10*3/uL (ref 0.7–3.1)
Lymphs: 29 %
MCH: 28.6 pg (ref 26.6–33.0)
MCHC: 32.4 g/dL (ref 31.5–35.7)
MCV: 88 fL (ref 79–97)
Monocytes Absolute: 0.7 10*3/uL (ref 0.1–0.9)
Monocytes: 9 %
Neutrophils Absolute: 4 10*3/uL (ref 1.4–7.0)
Neutrophils: 57 %
Platelets: 274 10*3/uL (ref 150–450)
RBC: 4.2 x10E6/uL (ref 4.14–5.80)
RDW: 13.3 % (ref 11.6–15.4)
WBC: 7.1 10*3/uL (ref 3.4–10.8)

## 2019-04-30 LAB — BASIC METABOLIC PANEL
BUN/Creatinine Ratio: 12 (ref 10–24)
BUN: 11 mg/dL (ref 8–27)
CO2: 27 mmol/L (ref 20–29)
Calcium: 8.9 mg/dL (ref 8.6–10.2)
Chloride: 102 mmol/L (ref 96–106)
Creatinine, Ser: 0.95 mg/dL (ref 0.76–1.27)
GFR calc Af Amer: 87 mL/min/{1.73_m2} (ref >59)
GFR calc non Af Amer: 75 mL/min/{1.73_m2} (ref >59)
Glucose: 98 mg/dL (ref 65–99)
Potassium: 3.9 mmol/L (ref 3.5–5.2)
Sodium: 143 mmol/L (ref 134–144)

## 2019-04-30 LAB — BRAIN NATRIURETIC PEPTIDE: BNP: 165 pg/mL — ABNORMAL HIGH (ref 0.0–100.0)

## 2019-04-30 NOTE — Telephone Encounter (Signed)
Please go ahead and give the verbal thanks

## 2019-04-30 NOTE — Telephone Encounter (Signed)
Will OT with [facility] calling requesting OT 2 x 3 weeks and 1 x 1 week   CB# [phone]

## 2019-05-01 ENCOUNTER — Telehealth: Payer: Self-pay | Admitting: Cardiovascular Disease

## 2019-05-01 DIAGNOSIS — J189 Pneumonia, unspecified organism: Secondary | ICD-10-CM | POA: Diagnosis not present

## 2019-05-01 DIAGNOSIS — J9611 Chronic respiratory failure with hypoxia: Secondary | ICD-10-CM | POA: Diagnosis not present

## 2019-05-01 DIAGNOSIS — U071 COVID-19: Secondary | ICD-10-CM | POA: Diagnosis not present

## 2019-05-01 MED ORDER — CEFDINIR 300 MG PO CAPS: 300.0000 mg | ORAL_CAPSULE | Freq: Two times a day (BID) | ORAL | 0 refills | Status: DC

## 2019-05-01 NOTE — Telephone Encounter (Signed)
LMOM RE: F/U Visit

## 2019-05-01 NOTE — Addendum Note (Signed)
Addended by: Dairl Ponder on: 05/01/2019 01:59 PM   Modules accepted: Orders

## 2019-05-01 NOTE — Telephone Encounter (Signed)
Verbal order given to Will at [facility] home health.

## 2019-05-04 DIAGNOSIS — J189 Pneumonia, unspecified organism: Secondary | ICD-10-CM | POA: Diagnosis not present

## 2019-05-04 DIAGNOSIS — U071 COVID-19: Secondary | ICD-10-CM | POA: Diagnosis not present

## 2019-05-04 DIAGNOSIS — J9611 Chronic respiratory failure with hypoxia: Secondary | ICD-10-CM | POA: Diagnosis not present

## 2019-05-05 DIAGNOSIS — U071 COVID-19: Secondary | ICD-10-CM | POA: Diagnosis not present

## 2019-05-05 DIAGNOSIS — J189 Pneumonia, unspecified organism: Secondary | ICD-10-CM | POA: Diagnosis not present

## 2019-05-05 DIAGNOSIS — J9611 Chronic respiratory failure with hypoxia: Secondary | ICD-10-CM | POA: Diagnosis not present

## 2019-05-06 DIAGNOSIS — U071 COVID-19: Secondary | ICD-10-CM | POA: Diagnosis not present

## 2019-05-06 DIAGNOSIS — G4733 Obstructive sleep apnea (adult) (pediatric): Secondary | ICD-10-CM | POA: Diagnosis not present

## 2019-05-06 DIAGNOSIS — J9611 Chronic respiratory failure with hypoxia: Secondary | ICD-10-CM | POA: Diagnosis not present

## 2019-05-06 DIAGNOSIS — J189 Pneumonia, unspecified organism: Secondary | ICD-10-CM | POA: Diagnosis not present

## 2019-05-07 DIAGNOSIS — U071 COVID-19: Secondary | ICD-10-CM | POA: Diagnosis not present

## 2019-05-07 DIAGNOSIS — J9611 Chronic respiratory failure with hypoxia: Secondary | ICD-10-CM | POA: Diagnosis not present

## 2019-05-07 DIAGNOSIS — J189 Pneumonia, unspecified organism: Secondary | ICD-10-CM | POA: Diagnosis not present

## 2019-05-08 DIAGNOSIS — J9611 Chronic respiratory failure with hypoxia: Secondary | ICD-10-CM | POA: Diagnosis not present

## 2019-05-08 DIAGNOSIS — U071 COVID-19: Secondary | ICD-10-CM | POA: Diagnosis not present

## 2019-05-08 DIAGNOSIS — J189 Pneumonia, unspecified organism: Secondary | ICD-10-CM | POA: Diagnosis not present

## 2019-05-11 DIAGNOSIS — J189 Pneumonia, unspecified organism: Secondary | ICD-10-CM | POA: Diagnosis not present

## 2019-05-11 DIAGNOSIS — U071 COVID-19: Secondary | ICD-10-CM | POA: Diagnosis not present

## 2019-05-11 DIAGNOSIS — J9611 Chronic respiratory failure with hypoxia: Secondary | ICD-10-CM | POA: Diagnosis not present

## 2019-05-12 DIAGNOSIS — J189 Pneumonia, unspecified organism: Secondary | ICD-10-CM | POA: Diagnosis not present

## 2019-05-12 DIAGNOSIS — U071 COVID-19: Secondary | ICD-10-CM | POA: Diagnosis not present

## 2019-05-12 DIAGNOSIS — J9611 Chronic respiratory failure with hypoxia: Secondary | ICD-10-CM | POA: Diagnosis not present

## 2019-05-13 DIAGNOSIS — J189 Pneumonia, unspecified organism: Secondary | ICD-10-CM | POA: Diagnosis not present

## 2019-05-13 DIAGNOSIS — J9611 Chronic respiratory failure with hypoxia: Secondary | ICD-10-CM | POA: Diagnosis not present

## 2019-05-13 DIAGNOSIS — U071 COVID-19: Secondary | ICD-10-CM

## 2019-05-14 DIAGNOSIS — J189 Pneumonia, unspecified organism: Secondary | ICD-10-CM | POA: Diagnosis not present

## 2019-05-14 DIAGNOSIS — U071 COVID-19: Secondary | ICD-10-CM | POA: Diagnosis not present

## 2019-05-14 DIAGNOSIS — J9611 Chronic respiratory failure with hypoxia: Secondary | ICD-10-CM | POA: Diagnosis not present

## 2019-05-15 DIAGNOSIS — J9611 Chronic respiratory failure with hypoxia: Secondary | ICD-10-CM | POA: Diagnosis not present

## 2019-05-15 DIAGNOSIS — J189 Pneumonia, unspecified organism: Secondary | ICD-10-CM | POA: Diagnosis not present

## 2019-05-15 DIAGNOSIS — U071 COVID-19: Secondary | ICD-10-CM | POA: Diagnosis not present

## 2019-05-18 DIAGNOSIS — J9611 Chronic respiratory failure with hypoxia: Secondary | ICD-10-CM | POA: Diagnosis not present

## 2019-05-18 DIAGNOSIS — U071 COVID-19: Secondary | ICD-10-CM | POA: Diagnosis not present

## 2019-05-18 DIAGNOSIS — J189 Pneumonia, unspecified organism: Secondary | ICD-10-CM | POA: Diagnosis not present

## 2019-05-19 DIAGNOSIS — J189 Pneumonia, unspecified organism: Secondary | ICD-10-CM | POA: Diagnosis not present

## 2019-05-19 DIAGNOSIS — U071 COVID-19: Secondary | ICD-10-CM | POA: Diagnosis not present

## 2019-05-19 DIAGNOSIS — J9611 Chronic respiratory failure with hypoxia: Secondary | ICD-10-CM | POA: Diagnosis not present

## 2019-05-21 DIAGNOSIS — J9601 Acute respiratory failure with hypoxia: Secondary | ICD-10-CM | POA: Diagnosis not present

## 2019-05-21 DIAGNOSIS — J441 Chronic obstructive pulmonary disease with (acute) exacerbation: Secondary | ICD-10-CM | POA: Diagnosis not present

## 2019-05-21 DIAGNOSIS — U071 COVID-19: Secondary | ICD-10-CM | POA: Diagnosis not present

## 2019-05-21 DIAGNOSIS — J189 Pneumonia, unspecified organism: Secondary | ICD-10-CM | POA: Diagnosis not present

## 2019-05-21 DIAGNOSIS — R0902 Hypoxemia: Secondary | ICD-10-CM | POA: Diagnosis not present

## 2019-05-21 DIAGNOSIS — J9611 Chronic respiratory failure with hypoxia: Secondary | ICD-10-CM | POA: Diagnosis not present

## 2019-05-22 DIAGNOSIS — J189 Pneumonia, unspecified organism: Secondary | ICD-10-CM | POA: Diagnosis not present

## 2019-05-22 DIAGNOSIS — J9611 Chronic respiratory failure with hypoxia: Secondary | ICD-10-CM | POA: Diagnosis not present

## 2019-05-22 DIAGNOSIS — U071 COVID-19: Secondary | ICD-10-CM | POA: Diagnosis not present

## 2019-05-25 DIAGNOSIS — J9611 Chronic respiratory failure with hypoxia: Secondary | ICD-10-CM | POA: Diagnosis not present

## 2019-05-25 DIAGNOSIS — J189 Pneumonia, unspecified organism: Secondary | ICD-10-CM | POA: Diagnosis not present

## 2019-05-25 DIAGNOSIS — U071 COVID-19: Secondary | ICD-10-CM | POA: Diagnosis not present

## 2019-06-01 DIAGNOSIS — J9611 Chronic respiratory failure with hypoxia: Secondary | ICD-10-CM | POA: Diagnosis not present

## 2019-06-01 DIAGNOSIS — U071 COVID-19: Secondary | ICD-10-CM | POA: Diagnosis not present

## 2019-06-01 DIAGNOSIS — J189 Pneumonia, unspecified organism: Secondary | ICD-10-CM | POA: Diagnosis not present

## 2019-06-03 ENCOUNTER — Ambulatory Visit: Payer: Medicare Other | Admitting: Family Medicine

## 2019-06-03 DIAGNOSIS — J189 Pneumonia, unspecified organism: Secondary | ICD-10-CM | POA: Diagnosis not present

## 2019-06-03 DIAGNOSIS — J9611 Chronic respiratory failure with hypoxia: Secondary | ICD-10-CM | POA: Diagnosis not present

## 2019-06-03 DIAGNOSIS — U071 COVID-19: Secondary | ICD-10-CM | POA: Diagnosis not present

## 2019-06-04 DIAGNOSIS — J189 Pneumonia, unspecified organism: Secondary | ICD-10-CM | POA: Diagnosis not present

## 2019-06-04 DIAGNOSIS — J9611 Chronic respiratory failure with hypoxia: Secondary | ICD-10-CM | POA: Diagnosis not present

## 2019-06-04 DIAGNOSIS — U071 COVID-19: Secondary | ICD-10-CM | POA: Diagnosis not present

## 2019-06-05 DIAGNOSIS — G4733 Obstructive sleep apnea (adult) (pediatric): Secondary | ICD-10-CM | POA: Diagnosis not present

## 2019-06-08 DIAGNOSIS — U071 COVID-19: Secondary | ICD-10-CM | POA: Diagnosis not present

## 2019-06-08 DIAGNOSIS — J189 Pneumonia, unspecified organism: Secondary | ICD-10-CM | POA: Diagnosis not present

## 2019-06-08 DIAGNOSIS — J9611 Chronic respiratory failure with hypoxia: Secondary | ICD-10-CM | POA: Diagnosis not present

## 2019-06-09 ENCOUNTER — Encounter: Payer: Self-pay | Admitting: Cardiovascular Disease

## 2019-06-09 ENCOUNTER — Other Ambulatory Visit: Payer: Self-pay

## 2019-06-09 ENCOUNTER — Ambulatory Visit: Payer: Medicare Other | Admitting: Cardiovascular Disease

## 2019-06-09 DIAGNOSIS — I2510 Atherosclerotic heart disease of native coronary artery without angina pectoris: Secondary | ICD-10-CM

## 2019-06-09 DIAGNOSIS — G4733 Obstructive sleep apnea (adult) (pediatric): Secondary | ICD-10-CM

## 2019-06-09 DIAGNOSIS — E785 Hyperlipidemia, unspecified: Secondary | ICD-10-CM | POA: Diagnosis not present

## 2019-06-09 DIAGNOSIS — I10 Essential (primary) hypertension: Secondary | ICD-10-CM

## 2019-06-09 DIAGNOSIS — Z9861 Coronary angioplasty status: Secondary | ICD-10-CM | POA: Diagnosis not present

## 2019-06-09 NOTE — Assessment & Plan Note (Signed)
History of dyslipidemia on statin therapy with recent lipid profile performed 11/07/2018 revealing total cholesterol 137, LDL 66 and HDL 49.

## 2019-06-09 NOTE — Progress Notes (Signed)
06/09/2019 [name]   December 19, 1940  [id number]  Primary Physician [name], MD Primary Cardiologist: Lorretta Harp MD FACP, [place], [place], [place]  HPI:  [name] is a 79 y.o.  moderately overweight, widowed Caucasian male, father of 2 living daughters (oldest daughter recently died at age 38), grandfather of 24 grandchildren, whom I last saw on  03/12/2018.  He is accompanied by one of his daughter [name] who he lives with.. He has a history of CAD, status post PCI and stenting of his LAD by me on January 28, 1995, in the setting of an acute anterior wall myocardial infarction. His other problems include hypertension, hyperlipidemia, and obstructive sleep apnea on CPAP. He denies chest pain or shortness of breath. He had a 2D echocardiogram in 2007 which revealed normal LV function. His last Myoview performed on July 21, 2009, was normal.Dr. Wolfgang Phoenix follows his lipid profile which most recently performed 11/07/2018 revealing total cholesterol of 137, LDL 66 and HDL 49.  Since I saw him a year ago he did contract COVID-19 in early March and [facility]  He is slowly recovering from this.  He complains of some atypical reflux-like symptoms that do not sound anginal.   Current Meds  Medication Sig  . acetaminophen (TYLENOL) 325 MG tablet Take 650 mg by mouth every 6 (six) hours as needed for moderate pain or headache.  . albuterol (VENTOLIN HFA) 108 (90 Base) MCG/ACT inhaler Inhale 2 puffs into the lungs every 6 (six) hours as needed for wheezing or shortness of breath.  Marland Kitchen aspirin 81 MG tablet Take 1 tablet (81 mg total) by mouth at bedtime.  Marland Kitchen azithromycin (ZITHROMAX Z-PAK) 250 MG tablet Take 2 tablets (500 mg) on  Day 1,  followed by 1 tablet (250 mg) once daily on Days 2 through 5.  . balsalazide (COLAZAL) 750 MG capsule Take 3 capsules (2,250 mg total) by mouth 3 (three) times daily.  . benzonatate (TESSALON) 200 MG capsule Take 1 capsule (200  mg total) by mouth 2 (two) times daily as needed for cough.  . cefdinir (OMNICEF) 300 MG capsule Take 1 capsule (300 mg total) by mouth 2 (two) times daily.  . clonazePAM (KLONOPIN) 0.5 MG tablet Take 1/2-1 tablet po BID PRN agitation. Caution:drowsiness; use sparingly  . clotrimazole (LOTRIMIN) 1 % cream Apply to affected area 2 times daily  . doxazosin (CARDURA) 4 MG tablet Take one half tablet every evening  . furosemide (LASIX) 40 MG tablet Take 1 tablet (40 mg total) by mouth every morning.  . metoprolol tartrate (LOPRESSOR) 50 MG tablet Take one half tablet every evening  . mupirocin ointment (BACTROBAN) 2 % Apply thin layer to affected area once a day  . nitroGLYCERIN (NITROSTAT) 0.4 MG SL tablet Place 0.4 mg under the tongue every 5 (five) minutes as needed for chest pain.  . pravastatin (PRAVACHOL) 40 MG tablet Take 1 tablet (40 mg total) by mouth at bedtime.  . predniSONE (DELTASONE) 10 MG tablet Take 3 tablets (30 mg total) by mouth See admin instructions. 40 MG x7 days, 35 MG x 7 days, 30 MG x 7 days 25 MG x7 day, 20 MG x7 days, then 15 MG x 7 days, then 10 MG x 7 days, then 5 mg x 7 days until your taper is complete in 8 weeks  . sertraline (ZOLOFT) 100 MG tablet Take one tablet daily  . tiotropium (SPIRIVA HANDIHALER) 18 MCG inhalation capsule One  inhalation po daily  . triamcinolone cream (KENALOG) 0.1 % Apply twice daily as needed for psoriasis.  . Vitamin D, Ergocalciferol, (DRISDOL) 1.25 MG (50000 UNIT) CAPS capsule Take 50,000 Units by mouth every 7 (seven) days. mondays     No Known Allergies  Social History   Socioeconomic History  . Marital status: Widowed    Spouse name: Not on file  . Number of children: Not on file  . Years of education: Not on file  . Highest education level: Not on file  Occupational History  . Occupation: maintenance    Comment: retired  Tobacco Use  . Smoking status: Former Smoker    Packs/day: 1.00    Years: 20.00    Pack years: 20.00      Types: Cigarettes    Quit date: 09/30/1969    Years since quitting: 49.7  . Smokeless tobacco: Never Used  Substance and Sexual Activity  . Alcohol use: No  . Drug use: No  . Sexual activity: Yes    Partners: Female    Birth control/protection: None    Comment: spouse  Other Topics Concern  . Not on file  Social History Narrative  . Not on file   Social Determinants of Health   Financial Resource Strain:   . Difficulty of Paying Living Expenses:   Food Insecurity:   . Worried About Charity fundraiser in the Last Year:   . Arboriculturist in the Last Year:   Transportation Needs:   . Film/video editor (Medical):   Marland Kitchen Lack of Transportation (Non-Medical):   Physical Activity:   . Days of Exercise per Week:   . Minutes of Exercise per Session:   Stress:   . Feeling of Stress :   Social Connections:   . Frequency of Communication with Friends and Family:   . Frequency of Social Gatherings with Friends and Family:   . Attends Religious Services:   . Active Member of Clubs or Organizations:   . Attends Archivist Meetings:   Marland Kitchen Marital Status:   Intimate Partner Violence:   . Fear of Current or Ex-Partner:   . Emotionally Abused:   Marland Kitchen Physically Abused:   . Sexually Abused:      Review of Systems: General: negative for chills, fever, night sweats or weight changes.  Cardiovascular: negative for chest pain, dyspnea on exertion, edema, orthopnea, palpitations, paroxysmal nocturnal dyspnea or shortness of breath Dermatological: negative for rash Respiratory: negative for cough or wheezing Urologic: negative for hematuria Abdominal: negative for nausea, vomiting, diarrhea, bright red blood per rectum, melena, or hematemesis Neurologic: negative for visual changes, syncope, or dizziness All other systems reviewed and are otherwise negative except as noted above.    Blood pressure (!) 128/56, pulse 68, height 6' 1"  (1.854 m), weight 284 lb (128.8 kg), SpO2  92 %.  General appearance: alert and no distress Neck: no adenopathy, no carotid bruit, no JVD, supple, symmetrical, trachea midline and thyroid not enlarged, symmetric, no tenderness/mass/nodules Lungs: clear to auscultation bilaterally Heart: regular rate and rhythm, S1, S2 normal, no murmur, click, rub or gallop Extremities: extremities normal, atraumatic, no cyanosis or edema Pulses: 2+ and symmetric Skin: Skin color, texture, turgor normal. No rashes or lesions Neurologic: Alert and oriented X 3, normal strength and tone. Normal symmetric reflexes. Normal coordination and gait  EKG not performed today  ASSESSMENT AND PLAN:   Obstructive sleep apnea History of obstructive sleep apnea on CPAP  CAD S/P percutaneous  coronary angioplasty History of CAD status post PCI and stenting of his LAD by myself January 28, 1995 in the setting of an anterior wall myocardial infarction.  His last Myoview performed 07/21/2009 was nonischemic.  He denies ischemic chest pain but does have some reflux type symptoms.  Essential hypertension History of essential hypertension on metoprolol blood pressure measured today 128/56  Dyslipidemia, goal LDL below 70 History of dyslipidemia on statin therapy with recent lipid profile performed 11/07/2018 revealing total cholesterol 137, LDL 66 and HDL 49.      Lorretta Harp MD [place], [facility] 06/09/2019 3:04 PM

## 2019-06-09 NOTE — Patient Instructions (Signed)
Medication Instructions:  NO CHANGE *If you need a refill on your cardiac medications before your next appointment, please call your pharmacy*   Lab Work: If you have labs (blood work) drawn today and your tests are completely normal, you will receive your results only by: Marland Kitchen MyChart Message (if you have MyChart) OR . A paper copy in the mail If you have any lab test that is abnormal or we need to change your treatment, we will call you to review the results   Follow-Up: At [facility], you and your health needs are our priority.  As part of our continuing mission to provide you with exceptional heart care, we have created designated Provider Care Teams.  These Care Teams include your primary Cardiologist (physician) and Advanced Practice Providers (APPs -  Physician Assistants and Nurse Practitioners) who all work together to provide you with the care you need, when you need it.  We recommend signing up for the patient portal called "MyChart".  Sign up information is provided on this After Visit Summary.  MyChart is used to connect with patients for Virtual Visits (Telemedicine).  Patients are able to view lab/test results, encounter notes, upcoming appointments, etc.  Non-urgent messages can be sent to your provider as well.   To learn more about what you can do with MyChart, go to [email or web address].    Your next appointment:   Your physician wants you to follow-up in: [place] will receive a reminder letter in the mail two months in advance. If you don't receive a letter, please call our office to schedule the follow-up appointment.   Your physician wants you to follow-up in: [place] will receive a reminder letter in the mail two months in advance. If you don't receive a letter, please call our office to schedule the follow-up appointment.

## 2019-06-09 NOTE — Assessment & Plan Note (Signed)
History of CAD status post PCI and stenting of his LAD by myself January 28, 1995 in the setting of an anterior wall myocardial infarction.  His last Myoview performed 07/21/2009 was nonischemic.  He denies ischemic chest pain but does have some reflux type symptoms.

## 2019-06-09 NOTE — Assessment & Plan Note (Signed)
History of obstructive sleep apnea on CPAP. 

## 2019-06-09 NOTE — Assessment & Plan Note (Signed)
History of essential hypertension on metoprolol blood pressure measured today 128/56

## 2019-06-10 DIAGNOSIS — J9611 Chronic respiratory failure with hypoxia: Secondary | ICD-10-CM | POA: Diagnosis not present

## 2019-06-10 DIAGNOSIS — U071 COVID-19: Secondary | ICD-10-CM | POA: Diagnosis not present

## 2019-06-10 DIAGNOSIS — J189 Pneumonia, unspecified organism: Secondary | ICD-10-CM | POA: Diagnosis not present

## 2019-06-11 ENCOUNTER — Other Ambulatory Visit: Payer: Self-pay | Admitting: Family Medicine

## 2019-06-15 ENCOUNTER — Ambulatory Visit: Payer: Medicare Other | Admitting: Podiatry

## 2019-06-15 ENCOUNTER — Other Ambulatory Visit: Payer: Self-pay

## 2019-06-15 DIAGNOSIS — M79676 Pain in unspecified toe(s): Secondary | ICD-10-CM | POA: Diagnosis not present

## 2019-06-15 DIAGNOSIS — L989 Disorder of the skin and subcutaneous tissue, unspecified: Secondary | ICD-10-CM | POA: Diagnosis not present

## 2019-06-15 DIAGNOSIS — B351 Tinea unguium: Secondary | ICD-10-CM

## 2019-06-16 DIAGNOSIS — J9611 Chronic respiratory failure with hypoxia: Secondary | ICD-10-CM | POA: Diagnosis not present

## 2019-06-16 DIAGNOSIS — U071 COVID-19: Secondary | ICD-10-CM | POA: Diagnosis not present

## 2019-06-16 DIAGNOSIS — J189 Pneumonia, unspecified organism: Secondary | ICD-10-CM | POA: Diagnosis not present

## 2019-06-17 DIAGNOSIS — U071 COVID-19: Secondary | ICD-10-CM | POA: Diagnosis not present

## 2019-06-17 DIAGNOSIS — J189 Pneumonia, unspecified organism: Secondary | ICD-10-CM | POA: Diagnosis not present

## 2019-06-17 DIAGNOSIS — J9611 Chronic respiratory failure with hypoxia: Secondary | ICD-10-CM | POA: Diagnosis not present

## 2019-06-18 NOTE — Progress Notes (Signed)
    Subjective: Patient is a 79 y.o. male presenting to the office today as a new patient with a chief complaint of painful callus lesion(s) noted to the bilateral feet that have been present for the past few years. He states walking increases the gradually worsening pain. He has not had any treatment for the symptoms.  Patient also complains of elongated, thickened nails that cause pain while ambulating in shoes. He is unable to trim his own nails. Patient presents today for further treatment and evaluation.  Past Medical History:  Diagnosis Date  . Ankylosing spondylitis ([place]) 11/07/2018  . Asthma   . BPH (benign prostatic hyperplasia)   . CAD (coronary artery disease) 01/1995  . Clostridium difficile colitis 04/2005  . Colitis 2011  . COPD (chronic obstructive pulmonary disease) ([place])   . Depression   . Diverticulosis   . DJD (degenerative joint disease)   . Gastric ulcer 04/17/10   Three 19m gastric ulcers, H.pylori serologies were negative  . GERD (gastroesophageal reflux disease)   . History of kidney stones   . Hyperlipidemia   . Hypertension   . Idiopathic chronic inflammatory bowel disease 05/18/2010   left-sided UC  . Kidney stone   . Morbid obesity ([place] 03/12/2018  . Obstructive sleep apnea    on Cpap  . Reflux 02/1995  . S/P endoscopy 07/24/10   retained gastric contents, benign bx    Objective:  Physical Exam General: Alert and oriented x3 in no acute distress  Dermatology: Hyperkeratotic lesion(s) present on the bilateral feet. Pain on palpation with a central nucleated core noted. Skin is warm, dry and supple bilateral lower extremities. Negative for open lesions or macerations. Nails are tender, long, thickened and dystrophic with subungual debris, consistent with onychomycosis, 1-5 bilateral. No signs of infection noted.  Vascular: Palpable pedal pulses bilaterally. No edema or erythema noted. Capillary refill within normal limits.  Neurological: Epicritic and  protective threshold grossly intact bilaterally.   Musculoskeletal Exam: Pain on palpation at the keratotic lesion(s) noted. Range of motion within normal limits bilateral. Muscle strength 5/5 in all groups bilateral.  Assessment: 1. Onychodystrophic nails 1-5 bilateral with hyperkeratosis of nails.  2. Onychomycosis of nail due to dermatophyte bilateral 3. Pre-ulcerative callus lesions noted to the bilateral feet x 2    Plan of Care:  1. Patient evaluated. 2. Excisional debridement of keratoic lesion(s) using a chisel blade was performed without incident.  3. Dressed with light dressing. 4. Mechanical debridement of nails 1-5 bilaterally performed using a nail nipper. Filed with dremel without incident.  5. Patient is to return to the clinic in 3 months.   BEdrick Kins DPM [facility]  Dr. BEdrick Kins [place]                                       [place] [id number]               Office [phone] Fax [phone]

## 2019-06-20 DIAGNOSIS — J9601 Acute respiratory failure with hypoxia: Secondary | ICD-10-CM | POA: Diagnosis not present

## 2019-06-20 DIAGNOSIS — J441 Chronic obstructive pulmonary disease with (acute) exacerbation: Secondary | ICD-10-CM | POA: Diagnosis not present

## 2019-06-20 DIAGNOSIS — R0902 Hypoxemia: Secondary | ICD-10-CM | POA: Diagnosis not present

## 2019-06-22 DIAGNOSIS — J9611 Chronic respiratory failure with hypoxia: Secondary | ICD-10-CM | POA: Diagnosis not present

## 2019-06-22 DIAGNOSIS — U071 COVID-19: Secondary | ICD-10-CM | POA: Diagnosis not present

## 2019-06-22 DIAGNOSIS — J189 Pneumonia, unspecified organism: Secondary | ICD-10-CM | POA: Diagnosis not present

## 2019-06-29 ENCOUNTER — Ambulatory Visit: Payer: Medicare Other | Admitting: Family Medicine

## 2019-07-06 DIAGNOSIS — G4733 Obstructive sleep apnea (adult) (pediatric): Secondary | ICD-10-CM | POA: Diagnosis not present

## 2019-07-09 ENCOUNTER — Other Ambulatory Visit: Payer: Self-pay | Admitting: Family Medicine

## 2019-07-09 ENCOUNTER — Telehealth: Payer: Self-pay | Admitting: Family Medicine

## 2019-07-09 MED ORDER — CLONAZEPAM 0.5 MG PO TABS: ORAL_TABLET | ORAL | 0 refills | Status: DC

## 2019-07-09 NOTE — Telephone Encounter (Signed)
Prescription sent in as requested

## 2019-07-09 NOTE — Telephone Encounter (Signed)
[facility] requesting refill on pt Clonazepam 0.5 mg tablets. Take 1/2 to 1 tablet po BID prn agitation. Pt last seen 04/29/19 for fatigue. Please advise. Thank you.

## 2019-07-21 ENCOUNTER — Telehealth: Payer: Self-pay | Admitting: Family Medicine

## 2019-07-21 DIAGNOSIS — J9601 Acute respiratory failure with hypoxia: Secondary | ICD-10-CM | POA: Diagnosis not present

## 2019-07-21 DIAGNOSIS — J441 Chronic obstructive pulmonary disease with (acute) exacerbation: Secondary | ICD-10-CM | POA: Diagnosis not present

## 2019-07-21 DIAGNOSIS — R0902 Hypoxemia: Secondary | ICD-10-CM | POA: Diagnosis not present

## 2019-07-21 NOTE — Telephone Encounter (Signed)
Form in provider office. Please advise. Thank you.

## 2019-07-21 NOTE — Telephone Encounter (Signed)
[name] with [employer] calling requesting that the CMN be corrected on 1B front page. Currently it states 88 but it needs to be 86.   It just needs to be marked out and 86 be wrote be side it, initialed and dated by provider.   Form has been printed out of epic to be corrected.   FAX# [phone].  CB# [phone].

## 2019-08-05 DIAGNOSIS — G4733 Obstructive sleep apnea (adult) (pediatric): Secondary | ICD-10-CM | POA: Diagnosis not present

## 2019-08-10 NOTE — Telephone Encounter (Signed)
The form was corrected to 51.  I initialed and dated.  Please send it back thanks

## 2019-08-11 NOTE — Telephone Encounter (Signed)
Completed form faxed to [facility]

## 2019-08-16 ENCOUNTER — Emergency Department (HOSPITAL_BASED_OUTPATIENT_CLINIC_OR_DEPARTMENT_OTHER): Payer: Medicare Other

## 2019-08-16 ENCOUNTER — Other Ambulatory Visit: Payer: Self-pay

## 2019-08-16 ENCOUNTER — Inpatient Hospital Stay (HOSPITAL_BASED_OUTPATIENT_CLINIC_OR_DEPARTMENT_OTHER)
Admission: EM | Admit: 2019-08-16 | Discharge: 2019-08-18 | DRG: 202 | Disposition: A | Discharge status: Home / Self Care | Payer: Medicare Other | Attending: Internal Medicine | Admitting: Internal Medicine

## 2019-08-16 ENCOUNTER — Encounter (HOSPITAL_BASED_OUTPATIENT_CLINIC_OR_DEPARTMENT_OTHER): Payer: Self-pay | Admitting: *Deleted

## 2019-08-16 DIAGNOSIS — Z823 Family history of stroke: Secondary | ICD-10-CM | POA: Diagnosis not present

## 2019-08-16 DIAGNOSIS — I110 Hypertensive heart disease with heart failure: Secondary | ICD-10-CM | POA: Diagnosis not present

## 2019-08-16 DIAGNOSIS — I5032 Chronic diastolic (congestive) heart failure: Secondary | ICD-10-CM | POA: Diagnosis not present

## 2019-08-16 DIAGNOSIS — J441 Chronic obstructive pulmonary disease with (acute) exacerbation: Secondary | ICD-10-CM | POA: Diagnosis not present

## 2019-08-16 DIAGNOSIS — Z841 Family history of disorders of kidney and ureter: Secondary | ICD-10-CM | POA: Diagnosis not present

## 2019-08-16 DIAGNOSIS — J209 Acute bronchitis, unspecified: Secondary | ICD-10-CM

## 2019-08-16 DIAGNOSIS — K5190 Ulcerative colitis, unspecified, without complications: Secondary | ICD-10-CM | POA: Diagnosis present

## 2019-08-16 DIAGNOSIS — G4733 Obstructive sleep apnea (adult) (pediatric): Secondary | ICD-10-CM | POA: Diagnosis present

## 2019-08-16 DIAGNOSIS — E785 Hyperlipidemia, unspecified: Secondary | ICD-10-CM | POA: Diagnosis not present

## 2019-08-16 DIAGNOSIS — R0602 Shortness of breath: Secondary | ICD-10-CM | POA: Diagnosis not present

## 2019-08-16 DIAGNOSIS — Z8249 Family history of ischemic heart disease and other diseases of the circulatory system: Secondary | ICD-10-CM | POA: Diagnosis not present

## 2019-08-16 DIAGNOSIS — K219 Gastro-esophageal reflux disease without esophagitis: Secondary | ICD-10-CM | POA: Diagnosis not present

## 2019-08-16 DIAGNOSIS — R531 Weakness: Secondary | ICD-10-CM | POA: Diagnosis not present

## 2019-08-16 DIAGNOSIS — R509 Fever, unspecified: Secondary | ICD-10-CM | POA: Diagnosis not present

## 2019-08-16 DIAGNOSIS — J439 Emphysema, unspecified: Secondary | ICD-10-CM | POA: Diagnosis not present

## 2019-08-16 DIAGNOSIS — J9611 Chronic respiratory failure with hypoxia: Secondary | ICD-10-CM | POA: Diagnosis present

## 2019-08-16 DIAGNOSIS — Z955 Presence of coronary angioplasty implant and graft: Secondary | ICD-10-CM

## 2019-08-16 DIAGNOSIS — J449 Chronic obstructive pulmonary disease, unspecified: Secondary | ICD-10-CM | POA: Diagnosis not present

## 2019-08-16 DIAGNOSIS — I10 Essential (primary) hypertension: Secondary | ICD-10-CM | POA: Diagnosis not present

## 2019-08-16 DIAGNOSIS — Z66 Do not resuscitate: Secondary | ICD-10-CM | POA: Diagnosis present

## 2019-08-16 DIAGNOSIS — R7989 Other specified abnormal findings of blood chemistry: Secondary | ICD-10-CM | POA: Diagnosis not present

## 2019-08-16 DIAGNOSIS — Z20822 Contact with and (suspected) exposure to covid-19: Secondary | ICD-10-CM | POA: Diagnosis not present

## 2019-08-16 DIAGNOSIS — Z801 Family history of malignant neoplasm of trachea, bronchus and lung: Secondary | ICD-10-CM | POA: Diagnosis not present

## 2019-08-16 DIAGNOSIS — I2510 Atherosclerotic heart disease of native coronary artery without angina pectoris: Secondary | ICD-10-CM | POA: Diagnosis present

## 2019-08-16 DIAGNOSIS — Z8711 Personal history of peptic ulcer disease: Secondary | ICD-10-CM | POA: Diagnosis not present

## 2019-08-16 DIAGNOSIS — F329 Major depressive disorder, single episode, unspecified: Secondary | ICD-10-CM | POA: Diagnosis not present

## 2019-08-16 DIAGNOSIS — Z8616 Personal history of COVID-19: Secondary | ICD-10-CM | POA: Diagnosis not present

## 2019-08-16 DIAGNOSIS — R6510 Systemic inflammatory response syndrome (SIRS) of non-infectious origin without acute organ dysfunction: Secondary | ICD-10-CM | POA: Diagnosis not present

## 2019-08-16 DIAGNOSIS — N400 Enlarged prostate without lower urinary tract symptoms: Secondary | ICD-10-CM | POA: Diagnosis present

## 2019-08-16 DIAGNOSIS — Z9981 Dependence on supplemental oxygen: Secondary | ICD-10-CM

## 2019-08-16 DIAGNOSIS — J440 Chronic obstructive pulmonary disease with acute lower respiratory infection: Secondary | ICD-10-CM | POA: Diagnosis not present

## 2019-08-16 DIAGNOSIS — Z87442 Personal history of urinary calculi: Secondary | ICD-10-CM

## 2019-08-16 DIAGNOSIS — Z87891 Personal history of nicotine dependence: Secondary | ICD-10-CM

## 2019-08-16 DIAGNOSIS — Z833 Family history of diabetes mellitus: Secondary | ICD-10-CM

## 2019-08-16 LAB — I-STAT VENOUS BLOOD GAS, ED
Acid-Base Excess: 0 mmol/L (ref 0.0–2.0)
Bicarbonate: 25 mmol/L (ref 20.0–28.0)
Calcium, Ion: 1.3 mmol/L (ref 1.15–1.40)
HCT: 45 % (ref 39.0–52.0)
Hemoglobin: 15.3 g/dL (ref 13.0–17.0)
O2 Saturation: 88 %
Potassium: 3.8 mmol/L (ref 3.5–5.1)
Sodium: 141 mmol/L (ref 135–145)
TCO2: 26 mmol/L (ref 22–32)
pCO2, Ven: 39.1 mmHg — ABNORMAL LOW (ref 44.0–60.0)
pH, Ven: 7.414 (ref 7.250–7.430)
pO2, Ven: 54 mmHg — ABNORMAL HIGH (ref 32.0–45.0)

## 2019-08-16 LAB — URINALYSIS, MICROSCOPIC (REFLEX)

## 2019-08-16 LAB — URINALYSIS, ROUTINE W REFLEX MICROSCOPIC
Bilirubin Urine: NEGATIVE
Glucose, UA: NEGATIVE mg/dL
Hgb urine dipstick: NEGATIVE
Ketones, ur: NEGATIVE mg/dL
Nitrite: NEGATIVE
Protein, ur: NEGATIVE mg/dL
Specific Gravity, Urine: 1.025 (ref 1.005–1.030)
pH: 5 (ref 5.0–8.0)

## 2019-08-16 LAB — PROTIME-INR
INR: 1.1 (ref 0.8–1.2)
Prothrombin Time: 13.5 seconds (ref 11.4–15.2)

## 2019-08-16 LAB — BRAIN NATRIURETIC PEPTIDE: B Natriuretic Peptide: 185.4 pg/mL — ABNORMAL HIGH (ref 0.0–100.0)

## 2019-08-16 LAB — SARS CORONAVIRUS 2 BY RT PCR (HOSPITAL ORDER, PERFORMED IN ~~LOC~~ HOSPITAL LAB): SARS Coronavirus 2: NEGATIVE

## 2019-08-16 LAB — TROPONIN I (HIGH SENSITIVITY)
Troponin I (High Sensitivity): 16 ng/L (ref <18)
Troponin I (High Sensitivity): 17 ng/L (ref <18)

## 2019-08-16 LAB — CBC
HCT: 44.5 % (ref 39.0–52.0)
Hemoglobin: 14.3 g/dL (ref 13.0–17.0)
MCH: 27.8 pg (ref 26.0–34.0)
MCHC: 32.1 g/dL (ref 30.0–36.0)
MCV: 86.6 fL (ref 80.0–100.0)
Platelets: 246 10*3/uL (ref 150–400)
RBC: 5.14 MIL/uL (ref 4.22–5.81)
RDW: 13.3 % (ref 11.5–15.5)
WBC: 18.2 10*3/uL — ABNORMAL HIGH (ref 4.0–10.5)
nRBC: 0 % (ref 0.0–0.2)

## 2019-08-16 LAB — LACTIC ACID, PLASMA: Lactic Acid, Venous: 2.5 mmol/L (ref 0.5–1.9)

## 2019-08-16 MED ORDER — SODIUM CHLORIDE 0.9 % IV BOLUS
1000.0000 mL | Freq: Once | INTRAVENOUS | Status: AC
  Administered 2019-08-16: 1000 mL via INTRAVENOUS

## 2019-08-16 MED ORDER — ACETAMINOPHEN 500 MG PO TABS
1000.0000 mg | ORAL_TABLET | Freq: Once | ORAL | Status: AC
  Administered 2019-08-16: 1000 mg via ORAL
  Filled 2019-08-16: qty 2

## 2019-08-16 MED ORDER — SODIUM CHLORIDE 0.9 % IV SOLN
2.0000 g | Freq: Once | INTRAVENOUS | Status: AC
  Administered 2019-08-16: 2 g via INTRAVENOUS
  Filled 2019-08-16: qty 20

## 2019-08-16 MED ORDER — SODIUM CHLORIDE 0.9 % IV SOLN
INTRAVENOUS | Status: DC | PRN
  Administered 2019-08-16: 500 mL via INTRAVENOUS

## 2019-08-16 MED ORDER — SODIUM CHLORIDE 0.9 % IV SOLN
INTRAVENOUS | Status: AC
  Filled 2019-08-16: qty 20

## 2019-08-16 NOTE — ED Triage Notes (Addendum)
Pt checking in with low O2, pt noted to be pale, diaphoretic and tachypnea. On O2 monitor, pts HR was 240. No triage room available, pt brought back to room 14.  Pt not oriented to place or time.  Pt does report that he was hospitalized with COVID several months ago and is now on 4L [place] since that time.

## 2019-08-16 NOTE — ED Notes (Signed)
Lab called a lactic of 2.5. Dr. Sedonia Small and primary RN aware.

## 2019-08-16 NOTE — ED Provider Notes (Signed)
[facility] Emergency Department Provider Note MRN:  [id number]  Arrival date & time: 08/16/19     Chief Complaint   Shortness of breath History of Present Illness   [name] is a 79 y.o. year-old male with a history of GERD, COPD presenting to the ED with chief complaint of shortness of breath.  Patient is confused, was in the waiting room looking ill, tachycardic.  He denies chest pain, he feels short of breath, denies abdominal pain.  I was unable to obtain an accurate HPI, PMH, or ROS due to the patient's altered mental status.  Level 5 caveat.  Review of Systems  Positive for shortness of breath, altered mental status  Patient's Health History    Past Medical History:  Diagnosis Date  . Ankylosing spondylitis ([place]) 11/07/2018  . Asthma   . BPH (benign prostatic hyperplasia)   . CAD (coronary artery disease) 01/1995  . Clostridium difficile colitis 04/2005  . Colitis 2011  . COPD (chronic obstructive pulmonary disease) ([place])   . Depression   . Diverticulosis   . DJD (degenerative joint disease)   . Gastric ulcer 04/17/10   Three 54m gastric ulcers, H.pylori serologies were negative  . GERD (gastroesophageal reflux disease)   . History of kidney stones   . Hyperlipidemia   . Hypertension   . Idiopathic chronic inflammatory bowel disease 05/18/2010   left-sided UC  . Kidney stone   . Morbid [place] ([place] 03/12/2018  . Obstructive sleep apnea    on Cpap  . Reflux 02/1995  . S/P endoscopy 07/24/10   retained gastric contents, benign bx    Past Surgical History:  Procedure Laterality Date  . ANORECTAL MANOMETRY  2016   [facility]: concern for possible fissure. Noted pelvic floor dyssnyergy  . BIOPSY  07/29/2017   Procedure: BIOPSY;  Surgeon: RDaneil Dolin MD;  Location: [facility];  Service: Endoscopy;;  ascending and sigmoid colon  . CARDIAC CATHETERIZATION     with stent  . CARDIOVASCULAR STRESS TEST  07/21/2009   No  scintigraphic evidence of inducible myocardial ischemia  . CARPAL TUNNEL RELEASE Left 01/17/2017   Procedure: LEFT CARPAL TUNNEL RELEASE;  Surgeon: HCarole Civil MD;  Location: [facility] ORS;  Service: Orthopedics;  Laterality: Left;  . CATARACT EXTRACTION, BILATERAL Bilateral   . CERVICAL SPINE SURGERY     C4-5  . COLONOSCOPY  04/2005   granularity and friability erosions from rectum to 40cm. Bx infection vs IBD. C. Diff positive at the time.   . COLONOSCOPY  05/2010   [facility]: left-sided UC, bx with no dysplasia, shallow diverticula  . COLONOSCOPY [name] 11/07/2012   RPQZ:RAQT-MAUQJproctocolitis status post segmental biopsy/Sigmoid colon polyps removed as described above. Procedure compromisd by technical difficulties. bx: Inflammation limited to sigmoid and rectum on pathology.  . [facility]  [facility] proct[facility]itis limited to sigmoid  . COLONOSCOPY WITH PROPOFOL [name] 07/29/2017   diverticulosis in colon, three 4-6 mm polyps at splenic flexure and in cecum, one 10 mm polyp in rectum, abnormal rectum and sigmoid consistent with active UC  . CORONARY STENT PLACEMENT  01/1995  . ESOPHAGOGASTRODUODENOSCOPY  07/24/2010   RFHL:KTGYBWesophagus  . ESOPHAGOGASTRODUODENOSCOPY  04/2014   [facility] negative small bowel biopsies  . FOOT SURGERY Bilateral two  . KNEE SURGERY  two  . POLYPECTOMY  07/29/2017   Procedure: POLYPECTOMY;  Surgeon: RDaneil Dolin MD;  Location[facility];  Service: Endoscopy;;  splenic flexure, ascending colon polyp;rectal  . SHOULDER SURGERY  two  . TONSILLECTOMY    . TRANSTHORACIC ECHOCARDIOGRAM  03/23/2009   EF 60-65%, normal LV systolic function  . ULNAR HEAD EXCISION Left 01/17/2017   Procedure: LEFT ULNAR HEAD RESECTION;  Surgeon: Carole Civil, MD;  Location: [facility] OR[facility];  Service: Orthopedics;  Laterality: Left;    Family History  Problem Relation Age of Onset  . Lung cancer Mother   . Cancer Mother        breast   . Diabetes Mother   . Stroke Father   . Hypertension Father   . Kidney failure Brother   . Other Child        blood infection  . Colon cancer Neg Hx     Social History   Socioeconomic History  . Marital status: Widowed    Spouse name: Not on file  . Number of children: Not on file  . Years of education: Not on file  . Highest education level: Not on file  Occupational History  . Occupation: maintenance    Comment: retired  Tobacco Use  . Smoking status: Former Smoker    Packs/day: 1.00    Years: 20.00    Pack years: 20.00    Types: Cigarettes    Quit date: 09/30/1969    Years since quitting: 49.9  . Smokeless tobacco: Never Used  Vaping Use  . Vaping Use: Never used  Substance and Sexual Activity  . Alcohol use: No  . Drug use: No  . Sexual activity: Yes    Partners: Female    Birth control/protection: None    Comment: spouse  Other Topics Concern  . Not on file  Social History Narrative  . Not on file   Social Determinants of Health   Financial Resource Strain:   . Difficulty of Paying Living Expenses:   Food Insecurity:   . Worried About Charity fundraiser in the Last Year:   . Arboriculturist in the Last Year:   Transportation Needs:   . Film/video editor (Medical):   Marland Kitchen Lack of Transportation (Non-Medical):   Physical Activity:   . Days of Exercise per Week:   . Minutes of Exercise per Session:   Stress:   . Feeling of Stress :   Social Connections:   . Frequency of Communication with Friends and Family:   . Frequency of Social Gatherings with Friends and Family:   . Attends Religious Services:   . Active Member of Clubs or Organizations:   . Attends Archivist Meetings:   Marland Kitchen Marital Status:   Intimate Partner Violence:   . Fear of Current or Ex-Partner:   . Emotionally Abused:   Marland Kitchen Physically Abused:   . Sexually Abused:      Physical Exam   Vitals:   08/16/19 2000 08/16/19 2030  BP: (!) 113/49 110/73  Pulse: 79 75  Resp:  (!) 27 17  Temp:    SpO2: 95% 95%    CONSTITUTIONAL: Chronically ill-appearing, NAD NEURO:  Alert and oriented x 3, no focal deficits EYES:  eyes equal and reactive ENT/NECK:  no LAD, no JVD CARDIO: Tachycardic rate, well-perfused, normal S1 and S2 PULM:  CTAB no wheezing or rhonchi, mildly tachypneic GI/GU:  normal bowel sounds, non-distended, non-tender MSK/SPINE:  No gross deformities, no edema SKIN:  no rash, atraumatic PSYCH:  Appropriate speech and behavior  *Additional and/or pertinent findings included in MDM below  Diagnostic and Interventional Summary    EKG Interpretation  Date/Time:  August 16, 2019 at 18: 06: 15 Ventricular Rate:  110 PR Interval:    QRS Duration:   QT Interval:    QTC Calculation:   R Axis:     Text Interpretation: Sinus rhythm confirmed by Dr. Gerlene Fee at 11 PM      Labs Reviewed  CBC - Abnormal; Notable for the following components:      Result Value   WBC 18.2 (*)    All other components within normal limits  BRAIN NATRIURETIC PEPTIDE - Abnormal; Notable for the following components:   B Natriuretic Peptide 185.4 (*)    All other components within normal limits  URINALYSIS, ROUTINE W REFLEX MICROSCOPIC - Abnormal; Notable for the following components:   Leukocytes,Ua SMALL (*)    All other components within normal limits  LACTIC ACID, PLASMA - Abnormal; Notable for the following components:   Lactic Acid, Venous 2.5 (*)    All other components within normal limits  URINALYSIS, MICROSCOPIC (REFLEX) - Abnormal; Notable for the following components:   Bacteria, UA FEW (*)    All other components within normal limits  I-STAT VENOUS BLOOD GAS, ED - Abnormal; Notable for the following components:   pCO2, Ven 39.1 (*)    pO2, Ven 54.0 (*)    All other components within normal limits  SARS CORONAVIRUS 2 BY RT PCR (HOSPITAL ORDER, [facility])  CULTURE, BLOOD (ROUTINE X 2)  CULTURE, BLOOD (ROUTINE X 2)    PROTIME-INR  COMPREHENSIVE METABOLIC PANEL  PROCALCITONIN  TROPONIN I (HIGH SENSITIVITY)  TROPONIN I (HIGH SENSITIVITY)    DG Chest Port 1 View  Final Result      Medications  0.9 %  sodium chloride infusion (500 mLs Intravenous New Bag/Given 08/16/19 2257)  acetaminophen (TYLENOL) tablet 1,000 mg (1,000 mg Oral Given 08/16/19 2139)  cefTRIAXone (ROCEPHIN) 2 g in sodium chloride 0.9 % 100 mL IVPB (2 g Intravenous New Bag/Given 08/16/19 2300)  sodium chloride 0.9 % bolus 1,000 mL (1,000 mLs Intravenous New Bag/Given 08/16/19 2249)     Procedures  /  Critical Care Procedures  ED Course and Medical Decision Making  I have reviewed the triage vital signs, the nursing notes, and pertinent available records from the EMR.  Listed above are laboratory and imaging tests that I personally ordered, reviewed, and interpreted and then considered in my medical decision making (see below for details).      There is initial concern for a heart rate of 240 measured via SPO2 monitor in triage.  Patient looked unwell, diaphoretic, short of breath.  Upon arrival to the resuscitation bay, patient's heart rate is between 100 and 110.  There is report that he ran out of his home O2.  He is requiring 3 to 4 L nasal cannula, but this has improved saturations into the 90s.  He looks greatly improved.  Considering COPD exacerbation, CHF, ACS, PE, work-up is pending.  Further history from patient's daughter at bedside.  Patient's baseline oxygen at home is 3 to 4 L.  He is currently on this amount of oxygen and satting well.  He is at his baseline mental status at this time.  Has been having fever at home, malaise, worsening issues getting around the house.  Endorsing mild frontal headaches, increased cough recently.  Work-up reveals elevated lactate, white blood cell count of 18, patient has rectal temperature of 100.7.  Code sepsis initiated,  no signs of pneumonia on x-ray, urinalysis without infection.  Fever of  unclear source.  On one of my evaluations patient's systolic blood pressure was in the 90s, providing IV fluids, IV antibiotics, admitted to hospital service for further evaluation.  Barth Kirks. Sedonia Small, MD [place] [email or web address]  Final Clinical Impressions(s) / ED Diagnoses     ICD-10-CM   1. Fever, unspecified fever cause  R50.9     ED Discharge Orders    None       Discharge Instructions Discussed with and Provided to Patient:   Discharge Instructions   None       Maudie Flakes, MD 08/16/19 2334

## 2019-08-16 NOTE — ED Notes (Signed)
Water provided, per [place] by EDP, for pt to drink in order to produce urine sample.

## 2019-08-16 NOTE — ED Notes (Signed)
Patient on 4LNC per patients' home regimen with oxygen saturations of 94%. Will continue to monitor as needed.

## 2019-08-17 DIAGNOSIS — N400 Enlarged prostate without lower urinary tract symptoms: Secondary | ICD-10-CM | POA: Diagnosis present

## 2019-08-17 DIAGNOSIS — E785 Hyperlipidemia, unspecified: Secondary | ICD-10-CM | POA: Diagnosis present

## 2019-08-17 DIAGNOSIS — J209 Acute bronchitis, unspecified: Secondary | ICD-10-CM

## 2019-08-17 DIAGNOSIS — J440 Chronic obstructive pulmonary disease with acute lower respiratory infection: Secondary | ICD-10-CM

## 2019-08-17 DIAGNOSIS — F329 Major depressive disorder, single episode, unspecified: Secondary | ICD-10-CM | POA: Diagnosis present

## 2019-08-17 DIAGNOSIS — I5032 Chronic diastolic (congestive) heart failure: Secondary | ICD-10-CM | POA: Diagnosis present

## 2019-08-17 DIAGNOSIS — J441 Chronic obstructive pulmonary disease with (acute) exacerbation: Secondary | ICD-10-CM | POA: Diagnosis present

## 2019-08-17 DIAGNOSIS — Z66 Do not resuscitate: Secondary | ICD-10-CM | POA: Diagnosis not present

## 2019-08-17 DIAGNOSIS — Z9981 Dependence on supplemental oxygen: Secondary | ICD-10-CM | POA: Diagnosis not present

## 2019-08-17 DIAGNOSIS — Z833 Family history of diabetes mellitus: Secondary | ICD-10-CM | POA: Diagnosis not present

## 2019-08-17 DIAGNOSIS — R6510 Systemic inflammatory response syndrome (SIRS) of non-infectious origin without acute organ dysfunction: Secondary | ICD-10-CM

## 2019-08-17 DIAGNOSIS — G4733 Obstructive sleep apnea (adult) (pediatric): Secondary | ICD-10-CM

## 2019-08-17 DIAGNOSIS — I10 Essential (primary) hypertension: Secondary | ICD-10-CM

## 2019-08-17 DIAGNOSIS — K219 Gastro-esophageal reflux disease without esophagitis: Secondary | ICD-10-CM | POA: Diagnosis present

## 2019-08-17 DIAGNOSIS — Z823 Family history of stroke: Secondary | ICD-10-CM | POA: Diagnosis not present

## 2019-08-17 DIAGNOSIS — J9611 Chronic respiratory failure with hypoxia: Secondary | ICD-10-CM | POA: Diagnosis not present

## 2019-08-17 DIAGNOSIS — K5190 Ulcerative colitis, unspecified, without complications: Secondary | ICD-10-CM

## 2019-08-17 DIAGNOSIS — Z8616 Personal history of COVID-19: Secondary | ICD-10-CM | POA: Diagnosis not present

## 2019-08-17 DIAGNOSIS — R7989 Other specified abnormal findings of blood chemistry: Secondary | ICD-10-CM | POA: Diagnosis present

## 2019-08-17 DIAGNOSIS — Z8249 Family history of ischemic heart disease and other diseases of the circulatory system: Secondary | ICD-10-CM | POA: Diagnosis not present

## 2019-08-17 DIAGNOSIS — Z8711 Personal history of peptic ulcer disease: Secondary | ICD-10-CM | POA: Diagnosis not present

## 2019-08-17 DIAGNOSIS — M6281 Muscle weakness (generalized): Secondary | ICD-10-CM | POA: Diagnosis not present

## 2019-08-17 DIAGNOSIS — I2510 Atherosclerotic heart disease of native coronary artery without angina pectoris: Secondary | ICD-10-CM | POA: Diagnosis present

## 2019-08-17 DIAGNOSIS — Z955 Presence of coronary angioplasty implant and graft: Secondary | ICD-10-CM | POA: Diagnosis not present

## 2019-08-17 DIAGNOSIS — Z801 Family history of malignant neoplasm of trachea, bronchus and lung: Secondary | ICD-10-CM | POA: Diagnosis not present

## 2019-08-17 DIAGNOSIS — Z841 Family history of disorders of kidney and ureter: Secondary | ICD-10-CM | POA: Diagnosis not present

## 2019-08-17 DIAGNOSIS — J449 Chronic obstructive pulmonary disease, unspecified: Secondary | ICD-10-CM | POA: Diagnosis not present

## 2019-08-17 DIAGNOSIS — I110 Hypertensive heart disease with heart failure: Secondary | ICD-10-CM | POA: Diagnosis present

## 2019-08-17 LAB — COMPREHENSIVE METABOLIC PANEL
ALT: 8 U/L (ref 0–44)
AST: 17 U/L (ref 15–41)
Albumin: 2.8 g/dL — ABNORMAL LOW (ref 3.5–5.0)
Alkaline Phosphatase: 50 U/L (ref 38–126)
Anion gap: 8 (ref 5–15)
BUN: 19 mg/dL (ref 8–23)
CO2: 24 mmol/L (ref 22–32)
Calcium: 8.6 mg/dL — ABNORMAL LOW (ref 8.9–10.3)
Chloride: 106 mmol/L (ref 98–111)
Creatinine, Ser: 0.94 mg/dL (ref 0.61–1.24)
GFR calc Af Amer: >60 mL/min (ref >60)
GFR calc non Af Amer: >60 mL/min (ref >60)
Glucose, Bld: 140 mg/dL — ABNORMAL HIGH (ref 70–99)
Potassium: 3.9 mmol/L (ref 3.5–5.1)
Sodium: 138 mmol/L (ref 135–145)
Total Bilirubin: 0.2 mg/dL — ABNORMAL LOW (ref 0.3–1.2)
Total Protein: 6.1 g/dL — ABNORMAL LOW (ref 6.5–8.1)

## 2019-08-17 LAB — PROCALCITONIN: Procalcitonin: <0.1 ng/mL

## 2019-08-17 LAB — LACTIC ACID, PLASMA: Lactic Acid, Venous: 1.2 mmol/L (ref 0.5–1.9)

## 2019-08-17 MED ORDER — ASPIRIN EC 81 MG PO TBEC
81.0000 mg | DELAYED_RELEASE_TABLET | Freq: Every day | ORAL | Status: DC
  Administered 2019-08-17 – 2019-08-18 (×2): 81 mg via ORAL
  Filled 2019-08-17 (×2): qty 1

## 2019-08-17 MED ORDER — PRAVASTATIN SODIUM 40 MG PO TABS
40.0000 mg | ORAL_TABLET | Freq: Every day | ORAL | Status: DC
  Administered 2019-08-17: 40 mg via ORAL
  Filled 2019-08-17: qty 1

## 2019-08-17 MED ORDER — IPRATROPIUM-ALBUTEROL 0.5-2.5 (3) MG/3ML IN SOLN
3.0000 mL | Freq: Four times a day (QID) | RESPIRATORY_TRACT | Status: DC
  Administered 2019-08-17 (×2): 3 mL via RESPIRATORY_TRACT
  Filled 2019-08-17 (×2): qty 3

## 2019-08-17 MED ORDER — HYDRALAZINE HCL 20 MG/ML IJ SOLN
10.0000 mg | Freq: Four times a day (QID) | INTRAMUSCULAR | Status: DC | PRN
  Administered 2019-08-17 – 2019-08-18 (×2): 10 mg via INTRAVENOUS
  Filled 2019-08-17 (×3): qty 1

## 2019-08-17 MED ORDER — IPRATROPIUM-ALBUTEROL 0.5-2.5 (3) MG/3ML IN SOLN
3.0000 mL | Freq: Three times a day (TID) | RESPIRATORY_TRACT | Status: DC
  Administered 2019-08-18: 3 mL via RESPIRATORY_TRACT
  Filled 2019-08-17: qty 3

## 2019-08-17 MED ORDER — GUAIFENESIN ER 600 MG PO TB12
1200.0000 mg | ORAL_TABLET | Freq: Two times a day (BID) | ORAL | Status: DC
  Administered 2019-08-17 – 2019-08-18 (×2): 1200 mg via ORAL
  Filled 2019-08-17 (×2): qty 2

## 2019-08-17 MED ORDER — METOPROLOL TARTRATE 25 MG PO TABS
25.0000 mg | ORAL_TABLET | Freq: Every evening | ORAL | Status: DC
  Administered 2019-08-17: 25 mg via ORAL
  Filled 2019-08-17: qty 1

## 2019-08-17 MED ORDER — ONDANSETRON HCL 4 MG/2ML IJ SOLN: 4.0000 mg | Freq: Four times a day (QID) | INTRAMUSCULAR | Status: DC | PRN

## 2019-08-17 MED ORDER — ACETAMINOPHEN 650 MG RE SUPP: 650.0000 mg | Freq: Four times a day (QID) | RECTAL | Status: DC | PRN

## 2019-08-17 MED ORDER — BUDESONIDE 0.25 MG/2ML IN SUSP
0.2500 mg | Freq: Two times a day (BID) | RESPIRATORY_TRACT | Status: DC
  Administered 2019-08-17 – 2019-08-18 (×2): 0.25 mg via RESPIRATORY_TRACT
  Filled 2019-08-17 (×2): qty 2

## 2019-08-17 MED ORDER — SERTRALINE HCL 100 MG PO TABS
100.0000 mg | ORAL_TABLET | Freq: Every day | ORAL | Status: DC
  Administered 2019-08-17 – 2019-08-18 (×2): 100 mg via ORAL
  Filled 2019-08-17: qty 1
  Filled 2019-08-17: qty 2

## 2019-08-17 MED ORDER — ENOXAPARIN SODIUM 60 MG/0.6ML ~~LOC~~ SOLN
60.0000 mg | SUBCUTANEOUS | Status: DC
  Administered 2019-08-17: 60 mg via SUBCUTANEOUS
  Filled 2019-08-17 (×2): qty 0.6

## 2019-08-17 MED ORDER — BALSALAZIDE DISODIUM 750 MG PO CAPS
2250.0000 mg | ORAL_CAPSULE | Freq: Two times a day (BID) | ORAL | Status: DC
  Administered 2019-08-17 – 2019-08-18 (×2): 2250 mg via ORAL
  Filled 2019-08-17 (×3): qty 3

## 2019-08-17 MED ORDER — ONDANSETRON HCL 4 MG PO TABS: 4.0000 mg | ORAL_TABLET | Freq: Four times a day (QID) | ORAL | Status: DC | PRN

## 2019-08-17 MED ORDER — DOXAZOSIN MESYLATE 2 MG PO TABS
2.0000 mg | ORAL_TABLET | Freq: Every day | ORAL | Status: DC
  Administered 2019-08-17: 2 mg via ORAL
  Filled 2019-08-17: qty 1

## 2019-08-17 MED ORDER — ALBUTEROL SULFATE (2.5 MG/3ML) 0.083% IN NEBU: 2.5000 mg | INHALATION_SOLUTION | RESPIRATORY_TRACT | Status: DC | PRN

## 2019-08-17 MED ORDER — LACTATED RINGERS IV SOLN: INTRAVENOUS | Status: AC

## 2019-08-17 MED ORDER — LOPERAMIDE HCL 2 MG PO CAPS
6.0000 mg | ORAL_CAPSULE | Freq: Every day | ORAL | Status: DC
  Administered 2019-08-18: 6 mg via ORAL
  Filled 2019-08-17: qty 3

## 2019-08-17 MED ORDER — METHYLPREDNISOLONE SODIUM SUCC 125 MG IJ SOLR
60.0000 mg | Freq: Two times a day (BID) | INTRAMUSCULAR | Status: DC
  Administered 2019-08-17 – 2019-08-18 (×2): 60 mg via INTRAVENOUS
  Filled 2019-08-17 (×2): qty 2

## 2019-08-17 MED ORDER — ACETAMINOPHEN 325 MG PO TABS
650.0000 mg | ORAL_TABLET | Freq: Once | ORAL | Status: AC
  Administered 2019-08-17: 650 mg via ORAL
  Filled 2019-08-17: qty 2

## 2019-08-17 MED ORDER — ACETAMINOPHEN 325 MG PO TABS
650.0000 mg | ORAL_TABLET | Freq: Four times a day (QID) | ORAL | Status: DC | PRN
  Administered 2019-08-17 – 2019-08-18 (×2): 650 mg via ORAL
  Filled 2019-08-17 (×2): qty 2

## 2019-08-17 MED ORDER — CLONAZEPAM 0.5 MG PO TABS
0.5000 mg | ORAL_TABLET | Freq: Two times a day (BID) | ORAL | Status: DC | PRN
  Administered 2019-08-17: 0.5 mg via ORAL
  Filled 2019-08-17: qty 1

## 2019-08-17 MED ORDER — SODIUM CHLORIDE 0.9 % IV SOLN
1.0000 g | INTRAVENOUS | Status: DC
  Administered 2019-08-17: 1 g via INTRAVENOUS
  Filled 2019-08-17 (×2): qty 10

## 2019-08-17 NOTE — ED Notes (Signed)
MD made aware of high blood pressure.

## 2019-08-17 NOTE — H&P (Signed)
History and Physical    [name]:[id number] DOB: Jun 14, 1940 DOA: 08/16/2019  PCP: [name], MD  Patient coming from: Home  I have personally briefly reviewed patient's old medical records in [place]  Chief Complaint: Fever, generalized weakness, increased shortness of breath  HPI: [name] is a 79 y.o. male with medical history significant of oxygen dependent COPD on 4 L, obstructive sleep apnea on CPAP, hypertension, ulcerative colitis, recent COVID-19 infection in 04/2019, presents to the hospital with fever, generalized weakness and shortness of breath.  Daughter reports that for the past 2 weeks, he has had intermittent episodes of shortness of breath, wheeze and cough.  These are treated with over-the-counter remedies and had improved on and off.  Yesterday evening, he developed fever of 101.  He had worsening shortness of breath, nonproductive cough and wheezing.  Denies any sore throat, congestion.  He has felt nauseous with poor p.o. intake, and had dry heaves overnight.  He has not had any other sick contacts recently.  ED Course: He was evaluated med [facility] and noted to have elevated WBC count, chest x-ray did not show any evidence of pneumonia.  Urinalysis did not show any signs of infection.  He was noted to be tachycardic, ill-appearing, diaphoretic and short of breath.  He had elevated lactate.  Code sepsis was called and received IV antibiotics and IV fluids.  He has been referred for admission.  Review of Systems: As per HPI otherwise 10 point review of systems negative.    Past Medical History:  Diagnosis Date  . Ankylosing spondylitis ([place]) 11/07/2018  . Asthma   . BPH (benign prostatic hyperplasia)   . CAD (coronary artery disease) 01/1995  . Clostridium difficile colitis 04/2005  . Colitis 2011  . COPD (chronic obstructive pulmonary disease) ([place])   . Depression   . Diverticulosis   . DJD (degenerative joint disease)   .  Gastric ulcer 04/17/10   Three 61m gastric ulcers, H.pylori serologies were negative  . GERD (gastroesophageal reflux disease)   . History of kidney stones   . Hyperlipidemia   . Hypertension   . Idiopathic chronic inflammatory bowel disease 05/18/2010   left-sided UC  . Kidney stone   . Morbid obesity ([place] 03/12/2018  . Obstructive sleep apnea    on Cpap  . Reflux 02/1995  . S/P endoscopy 07/24/10   retained gastric contents, benign bx    Past Surgical History:  Procedure Laterality Date  . ANORECTAL MANOMETRY  2016   [facility]: concern for possible fissure. Noted pelvic floor dyssnyergy  . BIOPSY  07/29/2017   Procedure: BIOPSY;  Surgeon: RDaneil Dolin MD;  Location: [facility]ITE;  Service: Endoscopy;;  ascending and sigmoid colon  . CARDIAC CATHETERIZATION     with stent  . CARDIOVASCULAR STRESS TEST  07/21/2009   No scintigraphic evidence of inducible myocardial ischemia  . CARPAL TUNNEL RELEASE Left 01/17/2017   Procedure: LEFT CARPAL TUNNEL RELEASE;  Surgeon: HCarole Civil MD;  Location: [facility] ORS;  Service: Orthopedics;  Laterality: Left;  . CATARACT EXTRACTION, BILATERAL Bilateral   . CERVICAL SPINE SURGERY     C4-5  . COLONOSCOPY  04/2005   granularity and friability erosions from rectum to 40cm. Bx infection vs IBD. C. Diff positive at the time.   . COLONOSCOPY  05/2010   [facility]: left-sided UC, bx with no dysplasia, shallow diverticula  . COLONOSCOPY [name] 11/07/2012   RLMB:EMLJ-QGBEEproctocolitis status post segmental  biopsy/Sigmoid colon polyps removed as described above. Procedure compromisd by technical difficulties. bx: Inflammation limited to sigmoid and rectum on pathology.  . COLONOSCOPY  04/2014   [facility] at [facility]: well localized proctocolitis limited to sigmoid  . COLONOSCOPY WITH PROPOFOL [name] 07/29/2017   diverticulosis in colon, three 4-6 mm polyps at splenic flexure and in cecum, one 10 mm polyp in rectum, abnormal rectum and sigmoid consistent with active  UC  . CORONARY STENT PLACEMENT  01/1995  . ESOPHAGOGASTRODUODENOSCOPY  07/24/2010   VOZ:DGUYQI esophagus  . ESOPHAGOGASTRODUODENOSCOPY  04/2014   Dr. [facility]oka Cowden at [facility]: negative small bowel biopsies  . FOOT SURGERY Bilateral two  . KNEE SURGERY  two  . POLYPECTOMY  07/29/2017   Procedure: POLYPECTOMY;  Surgeon: Daneil Dolin, MD;  Location: [facility];  Service: Endoscopy;;  splenic flexure, ascending colon polyp;rectal  . SHOULDER SURGERY  two  . TONSILLECTOMY    . TRANSTHORACIC ECHOCARDIOGRAM  03/23/2009   EF 60-65%, normal LV systolic function  . ULNAR HEAD EXCISION Left 01/17/2017   Procedure: LEFT ULNAR HEAD RESECTION;  Surgeon: Carole Civil, MD;  Location: [facility] ORS;  Service: Orthopedics;  Laterality: Left;    Social History:  reports that he quit smoking about 49 years ago. His smoking use included cigarettes. He has a 20.00 pack-year smoking history. He has never used smokeless tobacco. He reports that he does not drink alcohol and does not use drugs.  No Known Allergies  Family History  Problem Relation Age of Onset  . Lung cancer Mother   . Cancer Mother        breast  . Diabetes Mother   . Stroke Father   . Hypertension Father   . Kidney failure Brother   . Other Child        blood infection  . Colon cancer Neg Hx     Prior to Admission medications   Medication Sig Start Date End Date Taking? Authorizing Provider  acetaminophen (TYLENOL) 325 MG tablet Take 650 mg by mouth every 6 (six) hours as needed for moderate pain or headache.    [provider]  albuterol (VENTOLIN HFA) 108 (90 Base) MCG/ACT inhaler Inhale 2 puffs into the lungs every 6 (six) hours as needed for wheezing or shortness of breath. 12/23/18   [name], MD  aspirin 81 MG tablet Take 1 tablet (81 mg total) by mouth at bedtime. 03/18/18   Patrecia Pour, MD  azithromycin (ZITHROMAX Z-PAK) 250 MG tablet Take 2 tablets (500 mg) on  Day 1,  followed by 1 tablet (250 mg) once daily  on Days 2 through 5. 04/20/19   Luking, Elayne Snare, MD  balsalazide (COLAZAL) 750 MG capsule Take 3 capsules (2,250 mg total) by mouth 3 (three) times daily. 04/16/19 07/15/19  Annitta Needs, NP  benzonatate (TESSALON) 200 MG capsule Take 1 capsule (200 mg total) by mouth 2 (two) times daily as needed for cough. 04/13/19   Oretha Milch D, MD  cefdinir (OMNICEF) 300 MG capsule Take 1 capsule (300 mg total) by mouth 2 (two) times daily. 05/01/19   [name], MD  clonazePAM (KLONOPIN) 0.5 MG tablet Take 1/2-1 tablet po BID PRN agitation. Caution:drowsiness; use sparingly 07/09/19   [name], MD  clotrimazole (LOTRIMIN) 1 % cream Apply to affected area 2 times daily 04/22/19   Carmin Muskrat, MD  doxazosin (CARDURA) 4 MG tablet Take one half tablet every evening 04/29/19   [name], MD  furosemide (LASIX) 40 MG tablet Take 1 tablet (40 mg total) by mouth every morning. 12/23/18   [name], MD  metoprolol tartrate (LOPRESSOR) 50 MG tablet Take one half tablet every evening 04/29/19   [name], MD  mupirocin ointment (BACTROBAN) 2 % Apply thin layer to affected area once a day 11/06/18   [name], MD  nitroGLYCERIN (NITROSTAT) 0.4 MG SL tablet Place 0.4 mg under the tongue every 5 (five) minutes as needed for chest pain.    [provider]  pravastatin (PRAVACHOL) 40 MG tablet Take 1 tablet (40 mg total) by mouth at bedtime. 11/24/18   Lendon Colonel, NP  predniSONE (DELTASONE) 10 MG tablet Take 3 tablets (30 mg total) by mouth See admin instructions. 40 MG x7 days, 35 MG x 7 days, 30 MG x 7 days 25 MG x7 day, 20 MG x7 days, then 15 MG x 7 days, then 10 MG x 7 days, then 5 mg x 7 days until your taper is complete in 8 weeks 04/13/19   Oretha Milch D, MD  sertraline (ZOLOFT) 100 MG tablet Take one tablet daily 12/24/18   [name], MD  tiotropium (SPIRIVA HANDIHALER) 18 MCG inhalation capsule One inhalation po daily 11/06/18   [name], MD  triamcinolone  cream (KENALOG) 0.1 % Apply twice daily as needed for psoriasis. 04/22/18   [name], MD  Vitamin D, Ergocalciferol, (DRISDOL) 1.25 MG (50000 UNIT) CAPS capsule Take 50,000 Units by mouth every 7 (seven) days. mondays    [provider]    Physical Exam: Vitals:   08/17/19 1100 08/17/19 1130 08/17/19 1145 08/17/19 1306  BP: (!) 149/63 (!) 135/56  (!) 158/105  Pulse: 62 66  60  Resp: (!) 22 (!) 22  15  Temp:   97.7 F (36.5 C) 97.6 F (36.4 C)  TempSrc:   Oral Oral  SpO2: 91% 91%  95%  Weight:      Height:        Constitutional: NAD, calm, comfortable Eyes: PERRL, lids and conjunctivae normal ENMT: Mucous membranes are moist. Posterior pharynx clear of any exudate or lesions.Normal dentition.  Neck: normal, supple, no masses, no thyromegaly Respiratory: mild wheeze bilaterally. Normal respiratory effort. No accessory muscle use.  Cardiovascular: Regular rate and rhythm, no murmurs / rubs / gallops. No extremity edema. 2+ pedal pulses. No carotid bruits.  Abdomen: no tenderness, no masses palpated. No hepatosplenomegaly. Bowel sounds positive.  Musculoskeletal: no clubbing / cyanosis. No joint deformity upper and lower extremities. Good ROM, no contractures. Normal muscle tone.  Skin: no rashes, lesions, ulcers. No induration Neurologic: CN 2-12 grossly intact. Sensation intact, DTR normal. Strength 5/5 in all 4.  Psychiatric: Normal judgment and insight. Alert and oriented x 3. Normal mood.    Labs on Admission: I have personally reviewed following labs and imaging studies  CBC: Recent Labs  Lab 08/16/19 1816 08/16/19 1842  WBC 18.2*  --   HGB 14.3 15.3  HCT 44.5 45.0  MCV 86.6  --   PLT 246  --    Basic Metabolic Panel: Recent Labs  Lab 08/16/19 1842 08/17/19 0222  NA 141 138  K 3.8 3.9  CL  --  106  CO2  --  24  GLUCOSE  --  140*  BUN  --  19  CREATININE  --  0.94  CALCIUM  --  8.6*   GFR: Estimated Creatinine Clearance: 87.9 mL/min (by C-G  formula  based on SCr of 0.94 mg/dL). Liver Function Tests: Recent Labs  Lab 08/17/19 0222  AST 17  ALT 8  ALKPHOS 50  BILITOT 0.2*  PROT 6.1*  ALBUMIN 2.8*   No results for input(s): LIPASE, AMYLASE in the last 168 hours. No results for input(s): AMMONIA in the last 168 hours. Coagulation Profile: Recent Labs  Lab 08/16/19 1816  INR 1.1   Cardiac Enzymes: No results for input(s): CKTOTAL, CKMB, CKMBINDEX, TROPONINI in the last 168 hours. BNP (last 3 results) No results for input(s): PROBNP in the last 8760 hours. HbA1C: No results for input(s): HGBA1C in the last 72 hours. CBG: No results for input(s): GLUCAP in the last 168 hours. Lipid Profile: No results for input(s): CHOL, HDL, LDLCALC, TRIG, CHOLHDL, LDLDIRECT in the last 72 hours. Thyroid Function Tests: No results for input(s): TSH, T4TOTAL, FREET4, T3FREE, THYROIDAB in the last 72 hours. Anemia Panel: No results for input(s): VITAMINB12, FOLATE, FERRITIN, TIBC, IRON, RETICCTPCT in the last 72 hours. Urine analysis:    Component Value Date/Time   COLORURINE YELLOW 08/16/2019 2149   APPEARANCEUR CLEAR 08/16/2019 2149   LABSPEC 1.025 08/16/2019 2149   PHURINE 5.0 08/16/2019 2149   GLUCOSEU NEGATIVE 08/16/2019 2149   HGBUR NEGATIVE 08/16/2019 2149   BILIRUBINUR NEGATIVE 08/16/2019 2149   BILIRUBINUR ++ 11/18/2012 1319   KETONESUR NEGATIVE 08/16/2019 2149   PROTEINUR NEGATIVE 08/16/2019 2149   UROBILINOGEN 0.2 08/04/2014 1620   NITRITE NEGATIVE 08/16/2019 2149   LEUKOCYTESUR SMALL (A) 08/16/2019 2149    Radiological Exams on Admission: DG Chest Port 1 View  Result Date: 08/16/2019 CLINICAL DATA:  Fever, diaphoresis, tachypnea, generalized weakness EXAM: PORTABLE CHEST 1 VIEW COMPARISON:  04/22/2019 FINDINGS: Single frontal view of the chest demonstrates a stable cardiac silhouette. Chronic interstitial prominence throughout the lungs likely reflects scarring. No airspace disease, effusion, or pneumothorax.  Lungs are mildly hyperinflated. No acute bony abnormalities. IMPRESSION: 1. Background emphysema.  No superimposed process. Electronically Signed   By: Randa Ngo M.D.   On: 08/16/2019 19:09    EKG: not available for review. Per EDP note, sinus rhythm  Assessment/Plan Active Problems:   Ulcerative colitis ([name])   COPD with acute exacerbation ([name])   Obstructive sleep apnea   Essential hypertension   Chronic respiratory failure with hypoxia ([name])   SIRS (systemic inflammatory response syndrome) ([name])   DNR (do not resuscitate)   COPD with acute bronchitis ([place])   COPD exacerbation ([place])     1. COPD exacerbation with acute bronchitis.  Chest x-ray does not show any evidence of pneumonia.  He was having wheezing, cough, shortness of breath.  Fever may be related to viral bronchitis versus bacterial superinfection.  Start on ceftriaxone for now.  Continue treatment with steroids, bronchodilators and antibiotics. 2. Chronic respiratory failure with hypoxia.  Chronically on 4 L of oxygen.  Currently requiring 5 L. 3. SIRS.  Unclear etiology of fever, possibly related to bronchitis.  He did receive antibiotics and blood cultures have been sent.  Follow-up cultures.  Currently, hemodynamically stable. 4. Obstructive sleep apnea.  Continue CPAP nightly 5. Ulcerative colitis.  He is on chronic balsalazide.  Medication will need to be reconciled once verified by pharmacy. 6. Chronic diastolic congestive heart failure.  Appears to be compensated.  Holding diuretics since he appears to be mildly dehydrated. 7. Hypertension.  Will resume on metoprolol once verified by pharmacy.  DVT prophylaxis: lovenox  Code Status: DNR  Family Communication: discussed with daughter angela at the beside  Disposition Plan: pending physical therapy evaluation, discharge once fevers resolved and respiratory status has stabilized  Consults called:   Admission status: observation, medsurg   Kathie Dike  MD [facility]   If 7PM-7AM, please contact night-coverage [email or web address]   08/17/2019, 2:47 PM

## 2019-08-17 NOTE — Progress Notes (Signed)
Notified provider of need to order lactic acid. ° °

## 2019-08-17 NOTE — ED Notes (Signed)
Attempted report, charge is in report and has not yet assigned bed. Will call back.

## 2019-08-17 NOTE — ED Notes (Signed)
Attempted to call report again but no one answered the [phone] number

## 2019-08-18 DIAGNOSIS — J449 Chronic obstructive pulmonary disease, unspecified: Secondary | ICD-10-CM

## 2019-08-18 LAB — COMPREHENSIVE METABOLIC PANEL
ALT: 10 U/L (ref 0–44)
AST: 21 U/L (ref 15–41)
Albumin: 3.1 g/dL — ABNORMAL LOW (ref 3.5–5.0)
Alkaline Phosphatase: 55 U/L (ref 38–126)
Anion gap: 12 (ref 5–15)
BUN: 17 mg/dL (ref 8–23)
CO2: 23 mmol/L (ref 22–32)
Calcium: 9.8 mg/dL (ref 8.9–10.3)
Chloride: 105 mmol/L (ref 98–111)
Creatinine, Ser: 0.94 mg/dL (ref 0.61–1.24)
GFR calc Af Amer: >60 mL/min (ref >60)
GFR calc non Af Amer: >60 mL/min (ref >60)
Glucose, Bld: 141 mg/dL — ABNORMAL HIGH (ref 70–99)
Potassium: 5.1 mmol/L (ref 3.5–5.1)
Sodium: 140 mmol/L (ref 135–145)
Total Bilirubin: 0.3 mg/dL (ref 0.3–1.2)
Total Protein: 7.1 g/dL (ref 6.5–8.1)

## 2019-08-18 MED ORDER — IPRATROPIUM-ALBUTEROL 0.5-2.5 (3) MG/3ML IN SOLN: 3.0000 mL | Freq: Two times a day (BID) | RESPIRATORY_TRACT | Status: DC

## 2019-08-18 MED ORDER — ALUM & MAG HYDROXIDE-SIMETH 200-200-20 MG/5ML PO SUSP
30.0000 mL | Freq: Four times a day (QID) | ORAL | Status: DC | PRN
  Administered 2019-08-18: 30 mL via ORAL
  Filled 2019-08-18: qty 30

## 2019-08-18 MED ORDER — GUAIFENESIN ER 600 MG PO TB12: 600.0000 mg | ORAL_TABLET | Freq: Two times a day (BID) | ORAL | 0 refills | Status: DC

## 2019-08-18 MED ORDER — CEPHALEXIN 500 MG PO CAPS: 500.0000 mg | ORAL_CAPSULE | Freq: Three times a day (TID) | ORAL | 0 refills | Status: AC

## 2019-08-18 MED ORDER — AMLODIPINE BESYLATE 5 MG PO TABS
5.0000 mg | ORAL_TABLET | Freq: Every day | ORAL | 11 refills | Status: DC
Start: 2019-08-18 — End: 2019-09-09

## 2019-08-18 MED ORDER — BUDESONIDE-FORMOTEROL FUMARATE 160-4.5 MCG/ACT IN AERO: 2.0000 | INHALATION_SPRAY | Freq: Two times a day (BID) | RESPIRATORY_TRACT | 12 refills | Status: DC

## 2019-08-18 MED ORDER — PREDNISONE 10 MG PO TABS: ORAL_TABLET | ORAL | 0 refills | Status: DC

## 2019-08-18 MED ORDER — ALBUTEROL SULFATE (2.5 MG/3ML) 0.083% IN NEBU
2.5000 mg | INHALATION_SOLUTION | Freq: Four times a day (QID) | RESPIRATORY_TRACT | 12 refills | Status: DC | PRN
Start: 2019-08-18 — End: 2020-10-30

## 2019-08-18 NOTE — TOC Progression Note (Signed)
Transition of Care [facility]) - Progression Note    Patient Details  Name: [name] MRN: [id number] Date of Birth: [birth year]  Transition of Care [facility]) CM/SW Contact  Purcell Mouton, RN Phone Number: 08/18/2019, 2:31 PM  Clinical Narrative:    Spoke with pt's daughter [name] at bedside concerning discharge needs. [facility] was called and [facility] for DME.         Expected Discharge Plan and Services           Expected Discharge Date: 08/18/19                                     Social Determinants of Health (SDOH) Interventions    Readmission Risk Interventions No flowsheet data found.

## 2019-08-18 NOTE — Progress Notes (Signed)
AVS reviewed with patient's daughter - verbalized understanding of discharge instructions, physician follow-up, medications.  CSM called and spoke to daughter regarding wheelchair being delivered tomorrow to residence.  Nebulizer was delivered to patient's room prior to discharge.  Patient transported by NT via wheelchair to entrance for discharge.  Patient stable at time of discharge.

## 2019-08-18 NOTE — Evaluation (Signed)
Physical Therapy Evaluation Patient Details Name: [name] MRN: [id number] DOB: [birth year] Today's Date: 08/18/2019   History of Present Illness  [name] is a 79 y.o. male with medical history significant of oxygen dependent COPD on 4 L, obstructive sleep apnea on CPAP, hypertension, ulcerative colitis, recent COVID-19 infection in 04/2019, presents to the hospital with fever, generalized weakness and shortness of breath  Clinical Impression  The patient ambulated x 200' with Rw and min/minguard. SPo2 on 4 L 96%. Patient reports lives with daughter. Plans to return home. Pt admitted with above diagnosis.  Pt currently with functional limitations due to the deficits listed below (see PT Problem List). Pt will benefit from skilled PT to increase their independence and safety with mobility to allow discharge to the venue listed below.       Follow Up Recommendations Home health PT;Supervision for mobility/OOB    Equipment Recommendations  None recommended by PT    Recommendations for Other Services       Precautions / Restrictions Precautions Precautions: Fall Precaution Comments: monitor sats on Home O2     Mobility  Bed Mobility Overal bed mobility: Needs Assistance Bed Mobility: Supine to Sit     Supine to sit: Min guard     General bed mobility comments: extra time  Transfers Overall transfer level: Needs assistance Equipment used: Rolling walker (2 wheeled) Transfers: Sit to/from Stand Sit to Stand: Min guard            Ambulation/Gait Ambulation/Gait assistance: Min guard;Min assist Gait Distance (Feet): 200 Feet Assistive device: Rolling walker (2 wheeled) Gait Pattern/deviations: Step-through pattern     General Gait Details: gait is steady, patient speed is slow and steady.  when he stops to look up, has to lean back due to neck fusion  Stairs            Wheelchair Mobility    Modified Rankin (Stroke Patients Only)        Balance Overall balance assessment: History of Falls;Needs assistance Sitting-balance support: No upper extremity supported;Feet supported Sitting balance-Leahy Scale: Good     Standing balance support: During functional activity;Bilateral upper extremity supported Standing balance-Leahy Scale: Fair                               Pertinent Vitals/Pain Pain Assessment: No/denies pain    Home Living Family/patient expects to be discharged to:: Private residence Living Arrangements: Children Available Help at Discharge: Available 24 hours/day Type of Home: House Home Access: Stairs to enter Entrance Stairs-Rails: Left Entrance Stairs-Number of Steps: 1 Home Layout: One level Home Equipment: Walker - 4 wheels;Walker - 2 wheels Additional Comments: lift chair    Prior Function Level of Independence: Independent with assistive device(s)               Hand Dominance   Dominant Hand: Right    Extremity/Trunk Assessment   Upper Extremity Assessment Upper Extremity Assessment: Generalized weakness    Lower Extremity Assessment Lower Extremity Assessment: Generalized weakness    Cervical / Trunk Assessment Cervical / Trunk Assessment: Normal  Communication   Communication: HOH  Cognition Arousal/Alertness: Awake/alert Behavior During Therapy: WFL for tasks assessed/performed Overall Cognitive Status: Within Functional Limits for tasks assessed  General Comments      Exercises     Assessment/Plan    PT Assessment Patient needs continued PT services  PT Problem List Decreased strength;Decreased mobility;Decreased safety awareness;Decreased activity tolerance;Cardiopulmonary status limiting activity;Decreased balance;Decreased knowledge of use of DME       PT Treatment Interventions DME instruction;Therapeutic activities;Gait training;Therapeutic exercise;Patient/family education;Functional  mobility training    PT Goals (Current goals can be found in the Care Plan section)  Acute Rehab PT Goals Patient Stated Goal: to go home PT Goal Formulation: With patient Time For Goal Achievement: 09/01/19 Potential to Achieve Goals: Good    Frequency Min 3X/week   Barriers to discharge        Co-evaluation               AM-PAC PT "6 Clicks" Mobility  Outcome Measure Help needed turning from your back to your side while in a flat bed without using bedrails?: A Little Help needed moving from lying on your back to sitting on the side of a flat bed without using bedrails?: A Little Help needed moving to and from a bed to a chair (including a wheelchair)?: A Little Help needed standing up from a chair using your arms (e.g., wheelchair or bedside chair)?: A Little     6 Click Score: 12    End of Session Equipment Utilized During Treatment: Gait belt;Oxygen Activity Tolerance: Patient tolerated treatment well Patient left: in chair;with call bell/phone within reach;with chair alarm set Nurse Communication: Mobility status PT Visit Diagnosis: Unsteadiness on feet (R26.81);History of falling (Z91.81)    Time: 9355-2174 PT Time Calculation (min) (ACUTE ONLY): 26 min   Charges:   PT Evaluation $PT Eval Low Complexity: 1 Low PT Treatments $Gait Training: 8-22 mins        Tresa Endo PT Acute Rehabilitation Services Pager [phone] Office [phone]   Claretha Cooper 08/18/2019, 1:24 PM

## 2019-08-19 DIAGNOSIS — I2510 Atherosclerotic heart disease of native coronary artery without angina pectoris: Secondary | ICD-10-CM | POA: Diagnosis not present

## 2019-08-19 DIAGNOSIS — M6281 Muscle weakness (generalized): Secondary | ICD-10-CM | POA: Diagnosis not present

## 2019-08-19 DIAGNOSIS — G4733 Obstructive sleep apnea (adult) (pediatric): Secondary | ICD-10-CM | POA: Diagnosis not present

## 2019-08-19 DIAGNOSIS — J9611 Chronic respiratory failure with hypoxia: Secondary | ICD-10-CM | POA: Diagnosis not present

## 2019-08-19 DIAGNOSIS — Z8616 Personal history of COVID-19: Secondary | ICD-10-CM | POA: Diagnosis not present

## 2019-08-19 DIAGNOSIS — J441 Chronic obstructive pulmonary disease with (acute) exacerbation: Secondary | ICD-10-CM | POA: Diagnosis not present

## 2019-08-19 DIAGNOSIS — I5032 Chronic diastolic (congestive) heart failure: Secondary | ICD-10-CM | POA: Diagnosis not present

## 2019-08-19 DIAGNOSIS — I110 Hypertensive heart disease with heart failure: Secondary | ICD-10-CM | POA: Diagnosis not present

## 2019-08-19 DIAGNOSIS — Z87891 Personal history of nicotine dependence: Secondary | ICD-10-CM | POA: Diagnosis not present

## 2019-08-19 DIAGNOSIS — Z9981 Dependence on supplemental oxygen: Secondary | ICD-10-CM | POA: Diagnosis not present

## 2019-08-20 DIAGNOSIS — J9601 Acute respiratory failure with hypoxia: Secondary | ICD-10-CM | POA: Diagnosis not present

## 2019-08-20 DIAGNOSIS — R0902 Hypoxemia: Secondary | ICD-10-CM | POA: Diagnosis not present

## 2019-08-20 DIAGNOSIS — J441 Chronic obstructive pulmonary disease with (acute) exacerbation: Secondary | ICD-10-CM | POA: Diagnosis not present

## 2019-08-21 ENCOUNTER — Telehealth: Payer: Self-pay | Admitting: Family Medicine

## 2019-08-21 DIAGNOSIS — J9611 Chronic respiratory failure with hypoxia: Secondary | ICD-10-CM | POA: Diagnosis not present

## 2019-08-21 DIAGNOSIS — I5032 Chronic diastolic (congestive) heart failure: Secondary | ICD-10-CM | POA: Diagnosis not present

## 2019-08-21 DIAGNOSIS — Z8616 Personal history of COVID-19: Secondary | ICD-10-CM | POA: Diagnosis not present

## 2019-08-21 DIAGNOSIS — I110 Hypertensive heart disease with heart failure: Secondary | ICD-10-CM | POA: Diagnosis not present

## 2019-08-21 DIAGNOSIS — M6281 Muscle weakness (generalized): Secondary | ICD-10-CM | POA: Diagnosis not present

## 2019-08-21 DIAGNOSIS — J441 Chronic obstructive pulmonary disease with (acute) exacerbation: Secondary | ICD-10-CM | POA: Diagnosis not present

## 2019-08-21 DIAGNOSIS — Z9981 Dependence on supplemental oxygen: Secondary | ICD-10-CM | POA: Diagnosis not present

## 2019-08-21 DIAGNOSIS — I2510 Atherosclerotic heart disease of native coronary artery without angina pectoris: Secondary | ICD-10-CM | POA: Diagnosis not present

## 2019-08-21 DIAGNOSIS — G4733 Obstructive sleep apnea (adult) (pediatric): Secondary | ICD-10-CM | POA: Diagnosis not present

## 2019-08-21 DIAGNOSIS — Z87891 Personal history of nicotine dependence: Secondary | ICD-10-CM | POA: Diagnosis not present

## 2019-08-21 NOTE — Discharge Summary (Signed)
Physician Discharge Summary  [name]:[id number] DOB: 1940-06-19 DOA: 08/16/2019  PCP: [name], MD  Admit date: 08/16/2019 Discharge date: 08/18/2019  Admitted From: home Disposition:  home  Recommendations for Outpatient Follow-up:  1. Follow up with PCP in 1-2 weeks 2. Please obtain BMP/CBC in one week  Home Health:hhPT Equipment/Devices:walker, wheelchair, nebulizer  Discharge Condition:stable CODE STATUS:DNR Diet recommendation: heart healthy  Brief/Interim Summary: HPI: [name] is a 79 y.o. male with medical history significant of oxygen dependent COPD on 4 L, obstructive sleep apnea on CPAP, hypertension, ulcerative colitis, recent COVID-19 infection in 04/2019, presents to the hospital with fever, generalized weakness and shortness of breath.  Daughter reports that for the past 2 weeks, he has had intermittent episodes of shortness of breath, wheeze and cough.  These are treated with over-the-counter remedies and had improved on and off.  Yesterday evening, he developed fever of 101.  He had worsening shortness of breath, nonproductive cough and wheezing.  Denies any sore throat, congestion.  He has felt nauseous with poor p.o. intake, and had dry heaves overnight.  He has not had any other sick contacts recently.  Discharge Diagnoses:  Active Problems:   Ulcerative colitis ([place])   COPD with acute exacerbation ([name])   Obstructive sleep apnea   Essential hypertension   Chronic respiratory failure with hypoxia ([name])   SIRS (systemic inflammatory response syndrome) ([name])   DNR (do not resuscitate)   COPD with acute bronchitis ([place])   COPD exacerbation ([place])  1. COPD exacerbation with acute bronchitis.  Chest x-ray did not show any evidence of pneumonia.  Patient did have wheezing, cough, shortness of breath.  Fever that he had prior to admission may have been related to a viral bronchitis versus bacterial superinfection.  He was started on intravenous  ceftriaxone.  With steroids, bronchodilators and antibiotics, his overall respiratory status has improved.  He has been transitioned to a course of oral antibiotics and prednisone taper.  He is also been set up with a nebulizer at home.  Respiratory status appears to be approaching baseline. 2. Chronic respiratory failure with hypoxia.  He is chronically on 4 L of oxygen. 3. SIRS.  Unclear etiology of fever, possibly related to bronchitis.  He did receive antibiotics, and blood culture did not show any growth.  He remained hemodynamically stable. 4. Obstructive sleep apnea.  Continue on nightly CPAP. 5. Ulcerative colitis.  Continue on chronic balsalazide.  No evidence of flare at this time. 6. Chronic diastolic congestive heart failure.  Appears to be compensated. 7. Generalized weakness.  Seen by physical therapy with recommendations for home health physical therapy.  Family is also requested a wheelchair which has been ordered on discharge.  Discharge Instructions  Discharge Instructions    Diet - low sodium heart healthy   Complete by: As directed    Face-to-face encounter (required for Medicare/Medicaid patients)   Complete by: As directed    I Kathie Dike certify that this patient is under my care and that I, or a nurse practitioner or physician's assistant working with me, had a face-to-face encounter that meets the physician face-to-face encounter requirements with this patient on 08/18/2019. The encounter with the patient was in whole, or in part for the following medical condition(s) which is the primary reason for home health care (List medical condition): copd   The encounter with the patient was in whole, or in part, for the following medical condition, which is the primary reason for home  health care: copd   I certify that, based on my findings, the following services are medically necessary home health services: Physical therapy   Reason for Medically Necessary Home Health  Services: Therapy- Therapeutic Exercises to Increase Strength and Endurance   My clinical findings support the need for the above services: Shortness of breath with activity   Further, I certify that my clinical findings support that this patient is homebound due to: Shortness of Breath with activity   For home use only DME Nebulizer machine   Complete by: As directed    Patient needs a nebulizer to treat with the following condition: COPD exacerbation ([place])   Length of Need: Lifetime   For home use only DME standard manual wheelchair with seat cushion   Complete by: As directed    Patient suffers from copd which impairs their ability to perform daily activities like ambulate in the home.  A walker will not resolve issue with performing activities of daily living. A wheelchair will allow patient to safely perform daily activities. Patient can safely propel the wheelchair in the home or has a caregiver who can provide assistance. Length of need is Lifetime. Accessories: elevating leg rests (ELRs), wheel locks, extensions and anti-tippers.   Home Health   Complete by: As directed    To provide the following care/treatments: PT   Increase activity slowly   Complete by: As directed      Allergies as of 08/18/2019   No Known Allergies     Medication List    STOP taking these medications   azithromycin 250 MG tablet Commonly known as: Zithromax Z-Pak   cefdinir 300 MG capsule Commonly known as: OMNICEF     TAKE these medications   acetaminophen 325 MG tablet Commonly known as: TYLENOL Take 650 mg by mouth every 6 (six) hours as needed for moderate pain or headache.   albuterol 108 (90 Base) MCG/ACT inhaler Commonly known as: VENTOLIN HFA Inhale 2 puffs into the lungs every 6 (six) hours as needed for wheezing or shortness of breath. What changed: Another medication with the same name was added. Make sure you understand how and when to take each.   albuterol (2.5 MG/3ML) 0.083%  nebulizer solution Commonly known as: PROVENTIL Take 3 mLs (2.5 mg total) by nebulization every 6 (six) hours as needed for wheezing or shortness of breath. What changed: You were already taking a medication with the same name, and this prescription was added. Make sure you understand how and when to take each.   amLODipine 5 MG tablet Commonly known as: NORVASC Take 1 tablet (5 mg total) by mouth daily.   aspirin 81 MG tablet Take 1 tablet (81 mg total) by mouth at bedtime. What changed: when to take this   balsalazide 750 MG capsule Commonly known as: COLAZAL Take 3 capsules (2,250 mg total) by mouth 3 (three) times daily. What changed: when to take this   benzonatate 200 MG capsule Commonly known as: TESSALON Take 1 capsule (200 mg total) by mouth 2 (two) times daily as needed for cough.   budesonide-formoterol 160-4.5 MCG/ACT inhaler Commonly known as: Symbicort Inhale 2 puffs into the lungs in the morning and at bedtime.   cephALEXin 500 MG capsule Commonly known as: KEFLEX Take 1 capsule (500 mg total) by mouth 3 (three) times daily for 7 days.   clonazePAM 0.5 MG tablet Commonly known as: KlonoPIN Take 1/2-1 tablet po BID PRN agitation. Caution:drowsiness; use sparingly What changed:   how  much to take  how to take this  when to take this  reasons to take this  additional instructions   clotrimazole 1 % cream Commonly known as: LOTRIMIN Apply to affected area 2 times daily   doxazosin 4 MG tablet Commonly known as: CARDURA Take one half tablet every evening   furosemide 40 MG tablet Commonly known as: LASIX Take 1 tablet (40 mg total) by mouth every morning.   guaiFENesin 600 MG 12 hr tablet Commonly known as: MUCINEX Take 1 tablet (600 mg total) by mouth 2 (two) times daily.   loperamide 2 MG capsule Commonly known as: IMODIUM Take 6 mg by mouth daily.   metoprolol tartrate 50 MG tablet Commonly known as: LOPRESSOR Take one half tablet every  evening What changed:   how much to take  how to take this  when to take this   multivitamin with minerals Tabs tablet Take 1 tablet by mouth daily.   nitroGLYCERIN 0.4 MG SL tablet Commonly known as: NITROSTAT Place 0.4 mg under the tongue every 5 (five) minutes as needed for chest pain.   pravastatin 40 MG tablet Commonly known as: PRAVACHOL Take 1 tablet (40 mg total) by mouth at bedtime.   predniSONE 10 MG tablet Commonly known as: DELTASONE Take 47m po daily for 2 days then 38mdaily for 2 days then 2045maily for 2 days then 98m46mily for 2 days then stop What changed:   how much to take  how to take this  when to take this  additional instructions   sertraline 100 MG tablet Commonly known as: ZOLOFT Take one tablet daily What changed:   how much to take  how to take this  when to take this  additional instructions   Spiriva HandiHaler 18 MCG inhalation capsule Generic drug: tiotropium One inhalation po daily What changed:   how much to take  how to take this  when to take this  reasons to take this  additional instructions   Vitamin D (Ergocalciferol) 1.25 MG (50000 UNIT) Caps capsule Commonly known as: DRISDOL Take 50,000 Units by mouth every 7 (seven) days. Mondays            Durable Medical Equipment  (From admission, onward)         Start     Ordered   08/18/19 0000  For home use only DME Nebulizer machine       Question Answer Comment  Patient needs a nebulizer to treat with the following condition COPD exacerbation ([name])ChiloLength of Need Lifetime      08/18/19 1419   08/18/19 0000  For home use only DME standard manual wheelchair with seat cushion       Comments: Patient suffers from copd which impairs their ability to perform daily activities like ambulate in the home.  A walker will not resolve issue with performing activities of daily living. A wheelchair will allow patient to safely perform daily activities.  Patient can safely propel the wheelchair in the home or has a caregiver who can provide assistance. Length of need is Lifetime. Accessories: elevating leg rests (ELRs), wheel locks, extensions and anti-tippers.   08/18/19 1419          No Known Allergies  Consultations:     Procedures/Studies: DG Chest Port 1 View  Result Date: 08/16/2019 CLINICAL DATA:  Fever, diaphoresis, tachypnea, generalized weakness EXAM: PORTABLE CHEST 1 VIEW COMPARISON:  04/22/2019 FINDINGS: Single frontal view of the chest demonstrates  a stable cardiac silhouette. Chronic interstitial prominence throughout the lungs likely reflects scarring. No airspace disease, effusion, or pneumothorax. Lungs are mildly hyperinflated. No acute bony abnormalities. IMPRESSION: 1. Background emphysema.  No superimposed process. Electronically Signed   By: Randa Ngo M.D.   On: 08/16/2019 19:09       Subjective: Shortness of breath improved, cough improving  Discharge Exam: Vitals:   08/18/19 0804 08/18/19 1035 08/18/19 1444 08/18/19 1639  BP:  (!) 144/125 (!) 181/92 (!) 159/86  Pulse: 85 97 92 70  Resp: 20 20 20    Temp:  97.6 F (36.4 C) 97.6 F (36.4 C)   TempSrc:  Oral    SpO2: 90% 90% 93%   Weight:      Height:        General: Pt is alert, awake, not in acute distress Cardiovascular: RRR, S1/S2 +, no rubs, no gallops Respiratory: CTA bilaterally, no wheezing, no rhonchi Abdominal: Soft, NT, ND, bowel sounds + Extremities: no edema, no cyanosis    The results of significant diagnostics from this hospitalization (including imaging, microbiology, ancillary and laboratory) are listed below for reference.     Microbiology: Recent Results (from the past 240 hour(s))  SARS Coronavirus 2 by RT PCR (hospital order, performed in [facility] hospital lab) Nasopharyngeal Nasopharyngeal Swab     Status: None   Collection Time: 08/16/19  7:18 PM   Specimen: Nasopharyngeal Swab  Result Value Ref Range Status    SARS Coronavirus 2 NEGATIVE NEGATIVE Final    Comment: (NOTE) SARS-CoV-2 target nucleic acids are NOT DETECTED.  The SARS-CoV-2 RNA is generally detectable in upper and lower respiratory specimens during the acute phase of infection. The lowest concentration of SARS-CoV-2 viral copies this assay can detect is 250 copies / mL. A negative result does not preclude SARS-CoV-2 infection and should not be used as the sole basis for treatment or other patient management decisions.  A negative result may occur with improper specimen collection / handling, submission of specimen other than nasopharyngeal swab, presence of viral mutation(s) within the areas targeted by this assay, and inadequate number of viral copies (<250 copies / mL). A negative result must be combined with clinical observations, patient history, and epidemiological information.  Fact Sheet for Patients:   StrictlyIdeas.no  Fact Sheet for Healthcare Providers: BankingDealers.co.za  This test is not yet approved or  cleared by the [place] FDA and has been authorized for detection and/or diagnosis of SARS-CoV-2 by FDA under an Emergency Use Authorization (EUA).  This EUA will remain in effect (meaning this test can be used) for the duration of the COVID-19 declaration under Section 564(b)(1) of the Act, 21 U.S.C. section 360bbb-3(b)(1), unless the authorization is terminated or revoked sooner.  Performed at [facility], [address] [place], [place] [address]   Culture, blood (Routine X 2) w Reflex to ID Panel     Status: None (Preliminary result)   Collection Time: 08/16/19 10:15 PM   Specimen: BLOOD RIGHT HAND  Result Value Ref Range Status   Specimen Description   Final    BLOOD RIGHT HAND Performed at [facility], [address] [place], [place] [address]    Special Requests   Final    BOTTLES DRAWN AEROBIC AND ANAEROBIC Blood  Culture adequate volume Performed at [facility], [address] [place], [place] [address]    Culture   Final    NO GROWTH 4 DAYS Performed  at [facility], [place] [address] [place], [place] [address]    Report Status PENDING  Incomplete  Culture, blood (Routine X 2) w Reflex to ID Panel     Status: None (Preliminary result)   Collection Time: 08/16/19 10:30 PM   Specimen: BLOOD LEFT HAND  Result Value Ref Range Status   Specimen Description   Final    BLOOD LEFT HAND Performed at [facility], [address] [place], [place] [address]    Special Requests   Final    BOTTLES DRAWN AEROBIC AND ANAEROBIC Blood Culture adequate volume Performed at [facility], [address] [place], [place] [address]    Culture   Final    NO GROWTH 4 DAYS Performed at [facility], [place] [address] [place], [place] [address]    Report Status PENDING  Incomplete     Labs: BNP (last 3 results) Recent Labs    04/22/19 1210 04/29/19 1650 08/16/19 1835  BNP 98.1 165.0* 093.8*   Basic Metabolic Panel: Recent Labs  Lab 08/16/19 1842 08/17/19 0222 08/18/19 0437  NA 141 138 140  K 3.8 3.9 5.1  CL  --  106 105  CO2  --  24 23  GLUCOSE  --  140* 141*  BUN  --  19 17  CREATININE  --  0.94 0.94  CALCIUM  --  8.6* 9.8   Liver Function Tests: Recent Labs  Lab 08/17/19 0222 08/18/19 0437  AST 17 21  ALT 8 10  ALKPHOS 50 55  BILITOT 0.2* 0.3  PROT 6.1* 7.1  ALBUMIN 2.8* 3.1*   No results for input(s): LIPASE, AMYLASE in the last 168 hours. No results for input(s): AMMONIA in the last 168 hours. CBC: Recent Labs  Lab 08/16/19 1816 08/16/19 1842  WBC 18.2*  --   HGB 14.3 15.3  HCT 44.5 45.0  MCV 86.6  --   PLT 246  --    Cardiac Enzymes: No results for input(s): CKTOTAL, CKMB, CKMBINDEX, TROPONINI in the last 168 hours. BNP: Invalid input(s): POCBNP CBG: No results for input(s): GLUCAP in the last 168 hours. D-Dimer No  results for input(s): DDIMER in the last 72 hours. Hgb A1c No results for input(s): HGBA1C in the last 72 hours. Lipid Profile No results for input(s): CHOL, HDL, LDLCALC, TRIG, CHOLHDL, LDLDIRECT in the last 72 hours. Thyroid function studies No results for input(s): TSH, T4TOTAL, T3FREE, THYROIDAB in the last 72 hours.  Invalid input(s): FREET3 Anemia work up No results for input(s): VITAMINB12, FOLATE, FERRITIN, TIBC, IRON, RETICCTPCT in the last 72 hours. Urinalysis    Component Value Date/Time   COLORURINE YELLOW 08/16/2019 2149   APPEARANCEUR CLEAR 08/16/2019 2149   LABSPEC 1.025 08/16/2019 2149   PHURINE 5.0 08/16/2019 2149   GLUCOSEU NEGATIVE 08/16/2019 2149   HGBUR NEGATIVE 08/16/2019 2149   BILIRUBINUR NEGATIVE 08/16/2019 2149   BILIRUBINUR ++ 11/18/2012 1319   KETONESUR NEGATIVE 08/16/2019 2149   PROTEINUR NEGATIVE 08/16/2019 2149   UROBILINOGEN 0.2 08/04/2014 1620   NITRITE NEGATIVE 08/16/2019 2149   LEUKOCYTESUR SMALL (A) 08/16/2019 2149   Sepsis Labs Invalid input(s): PROCALCITONIN,  WBC,  LACTICIDVEN Microbiology Recent Results (from the past 240 hour(s))  SARS Coronavirus 2 by RT PCR (hospital order, performed in [place] hospital lab) Nasopharyngeal Nasopharyngeal Swab     Status: None   Collection Time: 08/16/19  7:18 PM   Specimen: Nasopharyngeal Swab  Result Value Ref Range Status  SARS Coronavirus 2 NEGATIVE NEGATIVE Final    Comment: (NOTE) SARS-CoV-2 target nucleic acids are NOT DETECTED.  The SARS-CoV-2 RNA is generally detectable in upper and lower respiratory specimens during the acute phase of infection. The lowest concentration of SARS-CoV-2 viral copies this assay can detect is 250 copies / mL. A negative result does not preclude SARS-CoV-2 infection and should not be used as the sole basis for treatment or other patient management decisions.  A negative result may occur with improper specimen collection / handling, submission of specimen  other than nasopharyngeal swab, presence of viral mutation(s) within the areas targeted by this assay, and inadequate number of viral copies (<250 copies / mL). A negative result must be combined with clinical observations, patient history, and epidemiological information.  Fact Sheet for Patients:   StrictlyIdeas.no  Fact Sheet for Healthcare Providers: BankingDealers.co.za  This test is not yet approved or  cleared by the [place] FDA and has been authorized for detection and/or diagnosis of SARS-CoV-2 by FDA under an Emergency Use Authorization (EUA).  This EUA will remain in effect (meaning this test can be used) for the duration of the COVID-19 declaration under Section 564(b)(1) of the Act, 21 U.S.C. section 360bbb-3(b)(1), unless the authorization is terminated or revoked sooner.  Performed at [facility], [address] [place], [place] [address]   Culture, blood (Routine X 2) w Reflex to ID Panel     Status: None (Preliminary result)   Collection Time: 08/16/19 10:15 PM   Specimen: BLOOD RIGHT HAND  Result Value Ref Range Status   Specimen Description   Final    BLOOD RIGHT HAND Performed at [facility], [address] [place], [place] [address]    Special Requests   Final    BOTTLES DRAWN AEROBIC AND ANAEROBIC Blood Culture adequate volume Performed at [facility], [address] [place], [place] [address]    Culture   Final    NO GROWTH 4 DAYS Performed at [facility], [place] [address] [place], [place] [address]    Report Status PENDING  Incomplete  Culture, blood (Routine X 2) w Reflex to ID Panel     Status: None (Preliminary result)   Collection Time: 08/16/19 10:30 PM   Specimen: BLOOD LEFT HAND  Result Value Ref Range Status   Specimen Description   Final    BLOOD LEFT HAND Performed at [facility], [address] [place], [place] [address]     Special Requests   Final    BOTTLES DRAWN AEROBIC AND ANAEROBIC Blood Culture adequate volume Performed at [facility], [address] [place], [place] [address]    Culture   Final    NO GROWTH 4 DAYS Performed at [facility], [place] [address] [place], [place] [address]    Report Status PENDING  Incomplete     Time coordinating discharge: 41mns  SIGNED:   JKathie Dike MD  [facility] 08/21/2019, 5:42 PM   If 7PM-7AM, please contact night-coverage [email or web address]

## 2019-08-21 NOTE — Telephone Encounter (Signed)
Dominica Severin from [facility] wanted to report  [name] fell on Wed. He did not feel the need to go to the hospital family member helped him up. He twisted his left foot on a brick. No complaint of pain he does have a bruise on left knee, left hand, and above the left brow.

## 2019-08-22 LAB — CULTURE, BLOOD (ROUTINE X 2)
Culture: NO GROWTH
Culture: NO GROWTH
Special Requests: ADEQUATE
Special Requests: ADEQUATE

## 2019-08-25 DIAGNOSIS — Z9981 Dependence on supplemental oxygen: Secondary | ICD-10-CM | POA: Diagnosis not present

## 2019-08-25 DIAGNOSIS — J9611 Chronic respiratory failure with hypoxia: Secondary | ICD-10-CM | POA: Diagnosis not present

## 2019-08-25 DIAGNOSIS — I110 Hypertensive heart disease with heart failure: Secondary | ICD-10-CM | POA: Diagnosis not present

## 2019-08-25 DIAGNOSIS — Z87891 Personal history of nicotine dependence: Secondary | ICD-10-CM | POA: Diagnosis not present

## 2019-08-25 DIAGNOSIS — G4733 Obstructive sleep apnea (adult) (pediatric): Secondary | ICD-10-CM | POA: Diagnosis not present

## 2019-08-25 DIAGNOSIS — J441 Chronic obstructive pulmonary disease with (acute) exacerbation: Secondary | ICD-10-CM | POA: Diagnosis not present

## 2019-08-25 DIAGNOSIS — I2510 Atherosclerotic heart disease of native coronary artery without angina pectoris: Secondary | ICD-10-CM | POA: Diagnosis not present

## 2019-08-25 DIAGNOSIS — I5032 Chronic diastolic (congestive) heart failure: Secondary | ICD-10-CM | POA: Diagnosis not present

## 2019-08-25 DIAGNOSIS — M6281 Muscle weakness (generalized): Secondary | ICD-10-CM | POA: Diagnosis not present

## 2019-08-25 DIAGNOSIS — Z8616 Personal history of COVID-19: Secondary | ICD-10-CM | POA: Diagnosis not present

## 2019-08-27 DIAGNOSIS — I110 Hypertensive heart disease with heart failure: Secondary | ICD-10-CM | POA: Diagnosis not present

## 2019-08-27 DIAGNOSIS — J441 Chronic obstructive pulmonary disease with (acute) exacerbation: Secondary | ICD-10-CM | POA: Diagnosis not present

## 2019-08-27 DIAGNOSIS — J9611 Chronic respiratory failure with hypoxia: Secondary | ICD-10-CM | POA: Diagnosis not present

## 2019-08-27 DIAGNOSIS — I5032 Chronic diastolic (congestive) heart failure: Secondary | ICD-10-CM | POA: Diagnosis not present

## 2019-08-28 DIAGNOSIS — G4733 Obstructive sleep apnea (adult) (pediatric): Secondary | ICD-10-CM | POA: Diagnosis not present

## 2019-08-28 DIAGNOSIS — Z87891 Personal history of nicotine dependence: Secondary | ICD-10-CM | POA: Diagnosis not present

## 2019-08-28 DIAGNOSIS — J9611 Chronic respiratory failure with hypoxia: Secondary | ICD-10-CM | POA: Diagnosis not present

## 2019-08-28 DIAGNOSIS — M6281 Muscle weakness (generalized): Secondary | ICD-10-CM | POA: Diagnosis not present

## 2019-08-28 DIAGNOSIS — J441 Chronic obstructive pulmonary disease with (acute) exacerbation: Secondary | ICD-10-CM | POA: Diagnosis not present

## 2019-08-28 DIAGNOSIS — Z9981 Dependence on supplemental oxygen: Secondary | ICD-10-CM | POA: Diagnosis not present

## 2019-08-28 DIAGNOSIS — Z8616 Personal history of COVID-19: Secondary | ICD-10-CM | POA: Diagnosis not present

## 2019-08-28 DIAGNOSIS — I5032 Chronic diastolic (congestive) heart failure: Secondary | ICD-10-CM | POA: Diagnosis not present

## 2019-08-28 DIAGNOSIS — I110 Hypertensive heart disease with heart failure: Secondary | ICD-10-CM | POA: Diagnosis not present

## 2019-08-28 DIAGNOSIS — I2510 Atherosclerotic heart disease of native coronary artery without angina pectoris: Secondary | ICD-10-CM | POA: Diagnosis not present

## 2019-09-01 DIAGNOSIS — I2510 Atherosclerotic heart disease of native coronary artery without angina pectoris: Secondary | ICD-10-CM | POA: Diagnosis not present

## 2019-09-01 DIAGNOSIS — Z87891 Personal history of nicotine dependence: Secondary | ICD-10-CM | POA: Diagnosis not present

## 2019-09-01 DIAGNOSIS — Z8616 Personal history of COVID-19: Secondary | ICD-10-CM | POA: Diagnosis not present

## 2019-09-01 DIAGNOSIS — J9611 Chronic respiratory failure with hypoxia: Secondary | ICD-10-CM | POA: Diagnosis not present

## 2019-09-01 DIAGNOSIS — I5032 Chronic diastolic (congestive) heart failure: Secondary | ICD-10-CM | POA: Diagnosis not present

## 2019-09-01 DIAGNOSIS — M6281 Muscle weakness (generalized): Secondary | ICD-10-CM | POA: Diagnosis not present

## 2019-09-01 DIAGNOSIS — I110 Hypertensive heart disease with heart failure: Secondary | ICD-10-CM | POA: Diagnosis not present

## 2019-09-01 DIAGNOSIS — J441 Chronic obstructive pulmonary disease with (acute) exacerbation: Secondary | ICD-10-CM | POA: Diagnosis not present

## 2019-09-01 DIAGNOSIS — Z9981 Dependence on supplemental oxygen: Secondary | ICD-10-CM | POA: Diagnosis not present

## 2019-09-01 DIAGNOSIS — G4733 Obstructive sleep apnea (adult) (pediatric): Secondary | ICD-10-CM | POA: Diagnosis not present

## 2019-09-05 DIAGNOSIS — G4733 Obstructive sleep apnea (adult) (pediatric): Secondary | ICD-10-CM | POA: Diagnosis not present

## 2019-09-09 ENCOUNTER — Other Ambulatory Visit: Payer: Self-pay | Admitting: Family Medicine

## 2019-09-09 ENCOUNTER — Encounter: Payer: Self-pay | Admitting: Cardiovascular Disease

## 2019-09-09 ENCOUNTER — Telehealth: Payer: Self-pay | Admitting: Family Medicine

## 2019-09-09 ENCOUNTER — Other Ambulatory Visit: Payer: Self-pay

## 2019-09-09 ENCOUNTER — Ambulatory Visit: Payer: Medicare Other | Admitting: Cardiovascular Disease

## 2019-09-09 VITALS — BP 102/62 | HR 73 | Ht 73.0 in | Wt 284.0 lb

## 2019-09-09 DIAGNOSIS — I5032 Chronic diastolic (congestive) heart failure: Secondary | ICD-10-CM

## 2019-09-09 DIAGNOSIS — R0600 Dyspnea, unspecified: Secondary | ICD-10-CM

## 2019-09-09 DIAGNOSIS — Z9861 Coronary angioplasty status: Secondary | ICD-10-CM

## 2019-09-09 DIAGNOSIS — G4733 Obstructive sleep apnea (adult) (pediatric): Secondary | ICD-10-CM | POA: Diagnosis not present

## 2019-09-09 DIAGNOSIS — R079 Chest pain, unspecified: Secondary | ICD-10-CM | POA: Diagnosis not present

## 2019-09-09 DIAGNOSIS — I2510 Atherosclerotic heart disease of native coronary artery without angina pectoris: Secondary | ICD-10-CM | POA: Diagnosis not present

## 2019-09-09 DIAGNOSIS — E785 Hyperlipidemia, unspecified: Secondary | ICD-10-CM

## 2019-09-09 MED ORDER — NITROGLYCERIN 0.4 MG SL SUBL: 0.4000 mg | SUBLINGUAL_TABLET | SUBLINGUAL | 11 refills | Status: DC | PRN

## 2019-09-09 NOTE — Patient Instructions (Signed)
Medication Instructions:  STOP amlodipine  *If you need a refill on your cardiac medications before your next appointment, please call your pharmacy*  Testing/Procedures: Leane Call Stress Test & Echocardiogram @ [address]rd Floor   Follow-Up: At [facility], you and your health needs are our priority.  As part of our continuing mission to provide you with exceptional heart care, we have created designated Provider Care Teams.  These Care Teams include your primary Cardiologist (physician) and Advanced Practice Providers (APPs -  Physician Assistants and Nurse Practitioners) who all work together to provide you with the care you need, when you need it.  We recommend signing up for the patient portal called "MyChart".  Sign up information is provided on this After Visit Summary.  MyChart is used to connect with patients for Virtual Visits (Telemedicine).  Patients are able to view lab/test results, encounter notes, upcoming appointments, etc.  Non-urgent messages can be sent to your provider as well.   To learn more about what you can do with MyChart, go to [email or web address].    Your next appointment:   3 months(s) with APP on Dr. [facility]  Kerin Ransom, PA-C  [place], [place]  Coletta Memos, [place]  6 month(s) with Dr. Gwenlyn Found

## 2019-09-09 NOTE — Assessment & Plan Note (Signed)
History of dyslipidemia on statin therapy followed by his PCP

## 2019-09-09 NOTE — Assessment & Plan Note (Signed)
History of CAD status post stenting of his LAD January 28, 1995 in the setting of an anterior STEMI.  He does get occasional substernal chest pain.  His last Myoview performed 07/21/2009 was nonischemic.  On repeat a Lexiscan Myoview stress test to rule out an ischemic etiology.

## 2019-09-09 NOTE — Telephone Encounter (Signed)
Left message to return call 

## 2019-09-09 NOTE — Telephone Encounter (Signed)
Pt's daughter called, requesting appt Thur 8/5 or Fri 8/6 for a med check follow up & top number of BP a little low  Pt's daughter takes care of him & his medicine, she is having back surgery Monday morning & would like to get all this straight before then  Also pt was recently in the hospital  Please advise on when & where to schedule

## 2019-09-09 NOTE — Telephone Encounter (Signed)
So it appears that this situation has taken care of itself  If needing our assistance more let us know

## 2019-09-09 NOTE — Assessment & Plan Note (Signed)
History of diastolic heart failure with echo performed 03/15/2018 revealing normal systolic function with diastolic dysfunction.  He is on furosemide 40 mg a day.

## 2019-09-09 NOTE — Progress Notes (Signed)
09/09/2019 [name]   September 24, 1940  [id number]  Primary Physician Chris Drown, MD Primary Cardiologist: Chris Harp MD FACP, [place], [place], [place]  HPI:  [name] is a 79 y.o.  moderately overweight, widowed Caucasian male, father of2 living daughters (oldest daughter recently died at age 29), grandfather of 48 grandchildren, whom I last saw on  06/09/2019.  He is accompanied by one of his daughter [name] who he lives with.. He has a history of CAD, status post PCI and stenting of his LAD by me on January 28, 1995, in the setting of an acute anterior wall myocardial infarction. His other problems include hypertension, hyperlipidemia, and obstructive sleep apnea on CPAP. He denies chest pain or shortness of breath. He had a 2D echocardiogram in 2007 which revealed normal LV function. His last Myoview performed on July 21, 2009, was normal.Dr. Wolfgang Underwood follows his lipid profile which most recently performed10/03/2018 revealing total cholesterol of 137, LDL 66 and HDL 49.  He  did contract COVID-19 in early March and was hospitalized He is slowly recovering from this.  He complains of some atypical reflux-like symptoms that do not sound anginal.  He was hospitalized recently for 2 days 08/16/2019 with respiratory failure probably multifactorial twirled from COPD exacerbation and diastolic heart failure.  He does complain of some occasional chest pain and has chronic shortness of breath.  His blood pressure somewhat low today.    Current Meds  Medication Sig  . acetaminophen (TYLENOL) 325 MG tablet Take 650 mg by mouth every 6 (six) hours as needed for moderate pain or headache.  . albuterol (PROVENTIL) (2.5 MG/3ML) 0.083% nebulizer solution Take 3 mLs (2.5 mg total) by nebulization every 6 (six) hours as needed for wheezing or shortness of breath.  Marland Kitchen albuterol (VENTOLIN HFA) 108 (90 Base) MCG/ACT inhaler Inhale 2 puffs into the lungs every 6 (six) hours as needed for  wheezing or shortness of breath.  Marland Kitchen aspirin 81 MG tablet Take 1 tablet (81 mg total) by mouth at bedtime. (Patient taking differently: Take 81 mg by mouth daily. )  . balsalazide (COLAZAL) 750 MG capsule Take 3 capsules (2,250 mg total) by mouth 3 (three) times daily. (Patient taking differently: Take 2,250 mg by mouth in the morning and at bedtime. )  . benzonatate (TESSALON) 200 MG capsule Take 1 capsule (200 mg total) by mouth 2 (two) times daily as needed for cough.  . budesonide-formoterol (SYMBICORT) 160-4.5 MCG/ACT inhaler Inhale 2 puffs into the lungs in the morning and at bedtime.  . clonazePAM (KLONOPIN) 0.5 MG tablet Take 1/2-1 tablet po BID PRN agitation. Caution:drowsiness; use sparingly (Patient taking differently: Take 0.5 mg by mouth 2 (two) times daily as needed (agitation). Caution:drowsiness; use sparingly)  . clotrimazole (LOTRIMIN) 1 % cream Apply to affected area 2 times daily  . doxazosin (CARDURA) 4 MG tablet Take one half tablet every evening  . furosemide (LASIX) 40 MG tablet Take 1 tablet (40 mg total) by mouth every morning.  Marland Kitchen guaiFENesin (MUCINEX) 600 MG 12 hr tablet Take 1 tablet (600 mg total) by mouth 2 (two) times daily.  Marland Kitchen loperamide (IMODIUM) 2 MG capsule Take 6 mg by mouth daily.  . metoprolol tartrate (LOPRESSOR) 50 MG tablet Take one half tablet every evening (Patient taking differently: Take 25 mg by mouth daily. Take one half tablet every evening)  . Multiple Vitamin (MULTIVITAMIN WITH MINERALS) TABS tablet Take 1 tablet by mouth daily.  . nitroGLYCERIN (NITROSTAT) 0.4  MG SL tablet Place 1 tablet (0.4 mg total) under the tongue every 5 (five) minutes as needed for chest pain.  . pravastatin (PRAVACHOL) 40 MG tablet Take 1 tablet (40 mg total) by mouth at bedtime.  . predniSONE (DELTASONE) 10 MG tablet Take 76m po daily for 2 days then 375mdaily for 2 days then 2043maily for 2 days then 31m72mily for 2 days then stop  . tiotropium (SPIRIVA HANDIHALER) 18  MCG inhalation capsule One inhalation po daily (Patient taking differently: Place 18 mcg into inhaler and inhale daily as needed (shortness of breath). )  . Vitamin D, Ergocalciferol, (DRISDOL) 1.25 MG (50000 UNIT) CAPS capsule Take 50,000 Units by mouth every 7 (seven) days. Mondays  . [DISCONTINUED] amLODipine (NORVASC) 5 MG tablet Take 1 tablet (5 mg total) by mouth daily.  . [DISCONTINUED] nitroGLYCERIN (NITROSTAT) 0.4 MG SL tablet Place 0.4 mg under the tongue every 5 (five) minutes as needed for chest pain.  . [DISCONTINUED] sertraline (ZOLOFT) 100 MG tablet Take one tablet daily (Patient taking differently: Take 100 mg by mouth daily. )     No Known Allergies  Social History   Socioeconomic History  . Marital status: Widowed    Spouse name: Not on file  . Number of children: Not on file  . Years of education: Not on file  . Highest education level: Not on file  Occupational History  . Occupation: maintenance    Comment: retired  Tobacco Use  . Smoking status: Former Smoker    Packs/day: 1.00    Years: 20.00    Pack years: 20.00    Types: Cigarettes    Quit date: 09/30/1969    Years since quitting: 49.9  . Smokeless tobacco: Never Used  Vaping Use  . Vaping Use: Never used  Substance and Sexual Activity  . Alcohol use: No  . Drug use: No  . Sexual activity: Yes    Partners: Female    Birth control/protection: None    Comment: spouse  Other Topics Concern  . Not on file  Social History Narrative  . Not on file   Social Determinants of Health   Financial Resource Strain:   . Difficulty of Paying Living Expenses:   Food Insecurity:   . Worried About RunnCharity fundraiserthe Last Year:   . Ran Arboriculturistthe Last Year:   Transportation Needs:   . LackFilm/video editordical):   . LaMarland Kitchenk of Transportation (Non-Medical):   Physical Activity:   . Days of Exercise per Week:   . Minutes of Exercise per Session:   Stress:   . Feeling of Stress :   Social  Connections:   . Frequency of Communication with Friends and Family:   . Frequency of Social Gatherings with Friends and Family:   . Attends Religious Services:   . Active Member of Clubs or Organizations:   . Attends ClubArchivisttings:   . MaMarland Kitchenital Status:   Intimate Partner Violence:   . Fear of Current or Ex-Partner:   . Emotionally Abused:   . PhMarland Kitchensically Abused:   . Sexually Abused:      Review of Systems: General: negative for chills, fever, night sweats or weight changes.  Cardiovascular: negative for chest pain, dyspnea on exertion, edema, orthopnea, palpitations, paroxysmal nocturnal dyspnea or shortness of breath Dermatological: negative for rash Respiratory: negative for cough or wheezing Urologic: negative for hematuria Abdominal: negative for nausea, vomiting, diarrhea, bright red blood  per rectum, melena, or hematemesis Neurologic: negative for visual changes, syncope, or dizziness All other systems reviewed and are otherwise negative except as noted above.    Blood pressure 102/62, pulse 73, height 6' 1"  (1.854 m), weight 284 lb (128.8 kg), SpO2 94 %.  General appearance: alert and no distress Neck: no adenopathy, no carotid bruit, no JVD, supple, symmetrical, trachea midline and thyroid not enlarged, symmetric, no tenderness/mass/nodules Lungs: clear to auscultation bilaterally Heart: regular rate and rhythm, S1, S2 normal, no murmur, click, rub or gallop Extremities: extremities normal, atraumatic, no cyanosis or edema Pulses: 2+ and symmetric Skin: Skin color, texture, turgor normal. No rashes or lesions Neurologic: Alert and oriented X 3, normal strength and tone. Normal symmetric reflexes. Normal coordination and gait  EKG not performed today  ASSESSMENT AND PLAN:   Obstructive sleep apnea On CPAP  CAD S/P percutaneous coronary angioplasty History of CAD status post stenting of his LAD January 28, 1995 in the setting of an anterior STEMI.  He  does get occasional substernal chest pain.  His last Myoview performed 07/21/2009 was nonischemic.  On repeat a Lexiscan Myoview stress test to rule out an ischemic etiology.  Essential hypertension History of essential hypertension blood pressure measured today 102/62.  He was recently started on amlodipine.  He is also on metoprolol.  Given his low blood pressure I am going to discontinue the amlodipine.  Does complain of some dizziness.  Dyslipidemia, goal LDL below 70 History of dyslipidemia on statin therapy followed by his PCP  Chronic diastolic CHF (congestive heart failure) ([place]) History of diastolic heart failure with echo performed 03/15/2018 revealing normal systolic function with diastolic dysfunction.  He is on furosemide 40 mg a day.      Chris Harp MD FACP,FACC,FAHA, [facility] 09/09/2019 3:02 PM

## 2019-09-09 NOTE — Assessment & Plan Note (Signed)
On CPAP. ?

## 2019-09-09 NOTE — Assessment & Plan Note (Signed)
History of essential hypertension blood pressure measured today 102/62.  He was recently started on amlodipine.  He is also on metoprolol.  Given his low blood pressure I am going to discontinue the amlodipine.  Does complain of some dizziness.

## 2019-09-09 NOTE — Telephone Encounter (Addendum)
Daughter(DPR) states that cardiology wanted to make Dr Nicki Reaper agreed with his plan of care and stopping the Norvasc. He echo and stress test scheduled thru cardiology. She will schedule a follow up when his work up is completely  Daughter stated she only needs a call back if he doesn't agree with plan and wants him to stay on Norvasc. If patient does not receive a call back she will stop meds as cardiology advised and proceed with their recommendations.

## 2019-09-11 ENCOUNTER — Encounter: Payer: Self-pay | Admitting: Family Medicine

## 2019-09-11 DIAGNOSIS — J9611 Chronic respiratory failure with hypoxia: Secondary | ICD-10-CM | POA: Diagnosis not present

## 2019-09-11 DIAGNOSIS — Z8616 Personal history of COVID-19: Secondary | ICD-10-CM | POA: Diagnosis not present

## 2019-09-11 DIAGNOSIS — I2510 Atherosclerotic heart disease of native coronary artery without angina pectoris: Secondary | ICD-10-CM | POA: Diagnosis not present

## 2019-09-11 DIAGNOSIS — I110 Hypertensive heart disease with heart failure: Secondary | ICD-10-CM | POA: Diagnosis not present

## 2019-09-11 DIAGNOSIS — I5032 Chronic diastolic (congestive) heart failure: Secondary | ICD-10-CM | POA: Diagnosis not present

## 2019-09-11 DIAGNOSIS — Z87891 Personal history of nicotine dependence: Secondary | ICD-10-CM | POA: Diagnosis not present

## 2019-09-11 DIAGNOSIS — J441 Chronic obstructive pulmonary disease with (acute) exacerbation: Secondary | ICD-10-CM | POA: Diagnosis not present

## 2019-09-11 DIAGNOSIS — G4733 Obstructive sleep apnea (adult) (pediatric): Secondary | ICD-10-CM | POA: Diagnosis not present

## 2019-09-11 DIAGNOSIS — M6281 Muscle weakness (generalized): Secondary | ICD-10-CM | POA: Diagnosis not present

## 2019-09-11 DIAGNOSIS — Z9981 Dependence on supplemental oxygen: Secondary | ICD-10-CM | POA: Diagnosis not present

## 2019-09-11 NOTE — Telephone Encounter (Signed)
I agree with stopping the amlodipine.  We look forward to seeing see so after his work-up is completed

## 2019-09-11 NOTE — Telephone Encounter (Signed)
Daughter to stop the Amlodipine as advised by Cardiology

## 2019-09-15 ENCOUNTER — Encounter: Payer: Self-pay | Admitting: Podiatry

## 2019-09-15 ENCOUNTER — Other Ambulatory Visit: Payer: Self-pay

## 2019-09-15 ENCOUNTER — Ambulatory Visit (INDEPENDENT_AMBULATORY_CARE_PROVIDER_SITE_OTHER): Payer: Medicare Other

## 2019-09-15 ENCOUNTER — Ambulatory Visit: Payer: Medicare Other | Admitting: Podiatry

## 2019-09-15 DIAGNOSIS — S91109A Unspecified open wound of unspecified toe(s) without damage to nail, initial encounter: Secondary | ICD-10-CM | POA: Diagnosis not present

## 2019-09-15 DIAGNOSIS — M79676 Pain in unspecified toe(s): Secondary | ICD-10-CM | POA: Diagnosis not present

## 2019-09-15 DIAGNOSIS — L97521 Non-pressure chronic ulcer of other part of left foot limited to breakdown of skin: Secondary | ICD-10-CM | POA: Diagnosis not present

## 2019-09-15 DIAGNOSIS — B351 Tinea unguium: Secondary | ICD-10-CM | POA: Diagnosis not present

## 2019-09-15 MED ORDER — MUPIROCIN 2 % EX OINT: TOPICAL_OINTMENT | CUTANEOUS | 0 refills | Status: AC

## 2019-09-15 MED ORDER — CEPHALEXIN 500 MG PO CAPS: 500.0000 mg | ORAL_CAPSULE | Freq: Three times a day (TID) | ORAL | 0 refills | Status: DC

## 2019-09-15 NOTE — Patient Instructions (Signed)
DRESSING CHANGES L 4th toe:   A. IF DISPENSED, WEAR SURGICAL SHOE/BOOT AT ALL TIMES.  B. IF PRESCRIBED ORAL ANTIBIOTICS, TAKE ALL MEDICATION AS PRESCRIBED UNTIL ALL ARE GONE.  C. IF DOCTOR HAS DESIGNATED NONWEIGHTBEARING STATUS, PLEASE ADHERE TO INSTRUCTIONS.   1. KEEP left foot DRY AT ALL TIMES!!!!  2. CLEANSE ULCER WITH SALINE.  3. DAB DRY WITH GAUZE SPONGE.  4. APPLY A LIGHT AMOUNT OF Mupirocin Ointment TO BASE OF ULCER.  5. APPLY OUTER DRESSING AS INSTRUCTED.  6. WEAR SURGICAL SHOE/BOOT DAILY AT ALL TIMES. IF SUPPLIED, WEAR HEEL PROTECTORS AT ALL TIMES WHEN IN BED.  7. DO NOT WALK BAREFOOT!!!  8.  IF YOU EXPERIENCE ANY FEVER, CHILLS, NIGHTSWEATS, NAUSEA OR VOMITING, ELEVATED OR LOW BLOOD SUGARS, REPORT TO EMERGENCY ROOM.  9. IF YOU EXPERIENCE INCREASED REDNESS, PAIN, SWELLING, DISCOLORATION, ODOR, PUS, DRAINAGE OR WARMTH OF YOUR FOOT, REPORT TO EMERGENCY ROOM.

## 2019-09-15 NOTE — Progress Notes (Signed)
Subjective: Patient presents to clinic today cc of painful, discolored, thick toenails which interfere with daily activities. Pain is aggravated when wearing enclosed shoe gear. Pain is getting progressively worse and relieved with periodic professional debridement.  New concern today: Left 4th and 5th digit wounds. Patient states he is unsure of any injury. He says he may have kicked the foot board of his bed. He noticed blood on his bed sheets a few days ago. He hasn't been treating the area.  [name], MD is patient's PCP. Last visit was 05/13/2019.  Current Outpatient Medications on File Prior to Visit  Medication Sig Dispense Refill  . acetaminophen (TYLENOL) 325 MG tablet Take 650 mg by mouth every 6 (six) hours as needed for moderate pain or headache.    . albuterol (PROVENTIL) (2.5 MG/3ML) 0.083% nebulizer solution Take 3 mLs (2.5 mg total) by nebulization every 6 (six) hours as needed for wheezing or shortness of breath. 75 mL 12  . albuterol (VENTOLIN HFA) 108 (90 Base) MCG/ACT inhaler Inhale 2 puffs into the lungs every 6 (six) hours as needed for wheezing or shortness of breath. 18 g 0  . aspirin 81 MG tablet Take 1 tablet (81 mg total) by mouth at bedtime. (Patient taking differently: Take 81 mg by mouth daily. ) 30 tablet   . balsalazide (COLAZAL) 750 MG capsule Take 3 capsules (2,250 mg total) by mouth 3 (three) times daily. (Patient taking differently: Take 2,250 mg by mouth in the morning and at bedtime. ) 810 capsule 5  . benzonatate (TESSALON) 200 MG capsule Take 1 capsule (200 mg total) by mouth 2 (two) times daily as needed for cough. 20 capsule 0  . budesonide-formoterol (SYMBICORT) 160-4.5 MCG/ACT inhaler Inhale 2 puffs into the lungs in the morning and at bedtime. 1 Inhaler 12  . clonazePAM (KLONOPIN) 0.5 MG tablet Take 1/2-1 tablet po BID PRN agitation. Caution:drowsiness; use sparingly (Patient taking differently: Take 0.5 mg by mouth 2 (two) times daily as needed  (agitation). Caution:drowsiness; use sparingly) 20 tablet 0  . clotrimazole (LOTRIMIN) 1 % cream Apply to affected area 2 times daily 15 g 0  . doxazosin (CARDURA) 4 MG tablet Take one half tablet every evening 15 tablet 5  . furosemide (LASIX) 40 MG tablet Take 1 tablet (40 mg total) by mouth every morning.    Marland Kitchen guaiFENesin (MUCINEX) 600 MG 12 hr tablet Take 1 tablet (600 mg total) by mouth 2 (two) times daily. 30 tablet 0  . loperamide (IMODIUM) 2 MG capsule Take 6 mg by mouth daily.    . metoprolol tartrate (LOPRESSOR) 50 MG tablet Take one half tablet every evening (Patient taking differently: Take 25 mg by mouth daily. Take one half tablet every evening) 15 tablet 5  . Multiple Vitamin (MULTIVITAMIN WITH MINERALS) TABS tablet Take 1 tablet by mouth daily.    . nitroGLYCERIN (NITROSTAT) 0.4 MG SL tablet Place 1 tablet (0.4 mg total) under the tongue every 5 (five) minutes as needed for chest pain. 25 tablet 11  . pravastatin (PRAVACHOL) 40 MG tablet Take 1 tablet (40 mg total) by mouth at bedtime. 90 tablet 3  . predniSONE (DELTASONE) 10 MG tablet Take 94m po daily for 2 days then 327mdaily for 2 days then 2032maily for 2 days then 20m44mily for 2 days then stop 20 tablet 0  . sertraline (ZOLOFT) 100 MG tablet TAKE 1 TABLET BY MOUTH DAILY 30 tablet 0  . tiotropium (SPIRIVA HANDIHALER) 18 MCG inhalation  capsule One inhalation po daily (Patient taking differently: Place 18 mcg into inhaler and inhale daily as needed (shortness of breath). ) 30 capsule 5  . Vitamin D, Ergocalciferol, (DRISDOL) 1.25 MG (50000 UNIT) CAPS capsule Take 50,000 Units by mouth every 7 (seven) days. Mondays     No current facility-administered medications on file prior to visit.     No Known Allergies   Objective: There were no vitals filed for this visit.  [name] is a pleasant 79 y.o. Caucasian male morbidly obese in NAD. AAO X 3.  Vascular Examination: Capillary fill time to digits <3 seconds b/l  lower extremities. Palpable pedal pulses b/l LE. Pedal hair absent. Lower extremity skin temperature gradient within normal limits. No edema noted b/l lower extremities.  Dermatological Examination: Pedal skin with normal turgor, texture and tone bilaterally. No interdigital macerations bilaterally. Toenails 1-5 b/l elongated, discolored, dystrophic, thickened, crumbly with subungual debris and tenderness to dorsal palpation. Open wound with dried scab over left 4th PIPJ. Dried roof, no fluid expressed, no pain on side to side compression. Measures 1.0 x 1.0 x 0.1 cm, with blanchable erythema. Left 5th digit with scab noted measuring 0.5 x 0.5 cm. No surrounding erythema, no edema, no drainage.   Wound Location: L 4th toe There is a minimal amount of devitalized tissue present in the wound. Predebridement Wound Measurement:  1.0 x 1.0 x 0.1 cm Wound Base: fibrinous scab Peri-wound: Calloused Exudate: None: wound tissue dry Blood Loss during debridement: 0 cc('s). Sign(s) of clinical bacterial infection: erythema   Musculoskeletal Examination: Normal muscle strength 5/5 to all lower extremity muscle groups bilaterally. No pain crepitus or joint limitation noted with ROM b/l. No gross bony deformities bilaterally. Utilizes wheelchair for mobility assistance.  Neurological Examination: Protective sensation intact 5/5 intact bilaterally with 10g monofilament b/l. Proprioception intact bilaterally.  Xray findings left foot: no gas in tissues, plantar calcaneal spur noted left foot, posterior calcaneal spur noted left foot, no evidence of fracture left foot. and no bone erosion noted at location of wound of left 4th toe.  Assessment: 1. Pain due to onychomycosis of toenail   2. Wound, open, toe, initial encounter    Plan: -Patient was evaluated and treated and all questions answered.  -Toenails 1-5 b/l debrided in length and girth without iatrogenic laceration. -Patient/POA/Family member  educated on diagnosis and treatment plan of routine ulcer debridement/wound care.  -Ulceration cleansed with wound cleanser. Triple antibiotic ointment applied to base of wound and secured with light dressing. -Xrays taken of left foot and reviewed with Mr. Maggart and caregiver on today's visit. -Wound responded well to today's treatment. -Patient risk factors affecting healing of wound: foot deformity. -[name] given written instructions on daily wound care for L 4th toe ulceration. -Prescription written for Mupirocin Ointment. Patient is to apply to left 4th toe once daily and cover with dressing.  -Patient to report any pedal injuries to medical professional immediately. -Rx for Keflex 500 mg po, #30, to be taken three times daily for 10 days. -Patient to continue soft, supportive shoe gear daily. -Patient/POA to call should there be question/concern in the interim.  Return in about 3 weeks (around 10/06/2019) for follow up left 4th toe wound; mupirocin ointment and keflex 500 mg po tid.  Marzetta Board, DPM

## 2019-09-16 DIAGNOSIS — I5032 Chronic diastolic (congestive) heart failure: Secondary | ICD-10-CM | POA: Diagnosis not present

## 2019-09-16 DIAGNOSIS — G4733 Obstructive sleep apnea (adult) (pediatric): Secondary | ICD-10-CM | POA: Diagnosis not present

## 2019-09-16 DIAGNOSIS — I110 Hypertensive heart disease with heart failure: Secondary | ICD-10-CM | POA: Diagnosis not present

## 2019-09-16 DIAGNOSIS — Z8616 Personal history of COVID-19: Secondary | ICD-10-CM | POA: Diagnosis not present

## 2019-09-16 DIAGNOSIS — I2510 Atherosclerotic heart disease of native coronary artery without angina pectoris: Secondary | ICD-10-CM | POA: Diagnosis not present

## 2019-09-16 DIAGNOSIS — M6281 Muscle weakness (generalized): Secondary | ICD-10-CM | POA: Diagnosis not present

## 2019-09-16 DIAGNOSIS — J9611 Chronic respiratory failure with hypoxia: Secondary | ICD-10-CM | POA: Diagnosis not present

## 2019-09-16 DIAGNOSIS — Z9981 Dependence on supplemental oxygen: Secondary | ICD-10-CM | POA: Diagnosis not present

## 2019-09-16 DIAGNOSIS — Z87891 Personal history of nicotine dependence: Secondary | ICD-10-CM | POA: Diagnosis not present

## 2019-09-16 DIAGNOSIS — J441 Chronic obstructive pulmonary disease with (acute) exacerbation: Secondary | ICD-10-CM | POA: Diagnosis not present

## 2019-09-18 DIAGNOSIS — M6281 Muscle weakness (generalized): Secondary | ICD-10-CM | POA: Diagnosis not present

## 2019-09-20 DIAGNOSIS — J441 Chronic obstructive pulmonary disease with (acute) exacerbation: Secondary | ICD-10-CM | POA: Diagnosis not present

## 2019-09-20 DIAGNOSIS — J9601 Acute respiratory failure with hypoxia: Secondary | ICD-10-CM | POA: Diagnosis not present

## 2019-09-20 DIAGNOSIS — R0902 Hypoxemia: Secondary | ICD-10-CM | POA: Diagnosis not present

## 2019-09-22 DIAGNOSIS — Z9981 Dependence on supplemental oxygen: Secondary | ICD-10-CM | POA: Diagnosis not present

## 2019-09-22 DIAGNOSIS — M6281 Muscle weakness (generalized): Secondary | ICD-10-CM | POA: Diagnosis not present

## 2019-09-22 DIAGNOSIS — I5032 Chronic diastolic (congestive) heart failure: Secondary | ICD-10-CM | POA: Diagnosis not present

## 2019-09-22 DIAGNOSIS — J9611 Chronic respiratory failure with hypoxia: Secondary | ICD-10-CM | POA: Diagnosis not present

## 2019-09-22 DIAGNOSIS — I2510 Atherosclerotic heart disease of native coronary artery without angina pectoris: Secondary | ICD-10-CM | POA: Diagnosis not present

## 2019-09-22 DIAGNOSIS — Z87891 Personal history of nicotine dependence: Secondary | ICD-10-CM | POA: Diagnosis not present

## 2019-09-22 DIAGNOSIS — J441 Chronic obstructive pulmonary disease with (acute) exacerbation: Secondary | ICD-10-CM | POA: Diagnosis not present

## 2019-09-22 DIAGNOSIS — G4733 Obstructive sleep apnea (adult) (pediatric): Secondary | ICD-10-CM | POA: Diagnosis not present

## 2019-09-22 DIAGNOSIS — Z8616 Personal history of COVID-19: Secondary | ICD-10-CM | POA: Diagnosis not present

## 2019-09-22 DIAGNOSIS — I110 Hypertensive heart disease with heart failure: Secondary | ICD-10-CM | POA: Diagnosis not present

## 2019-09-23 ENCOUNTER — Telehealth: Payer: Self-pay | Admitting: General Practice

## 2019-09-23 ENCOUNTER — Telehealth (HOSPITAL_COMMUNITY): Payer: Self-pay

## 2019-09-23 NOTE — Telephone Encounter (Signed)
Detailed instructions left on the patient's answering machine. Asked to call back with any questions. S.Mattisen Pohlmann EMTP 

## 2019-09-23 NOTE — Telephone Encounter (Signed)
LVM for patient to return call to get follow up scheduled with [facility] from recall list

## 2019-09-25 ENCOUNTER — Other Ambulatory Visit (HOSPITAL_COMMUNITY)
Admission: RE | Admit: 2019-09-25 | Discharge: 2019-09-25 | Disposition: A | Discharge status: Home / Self Care | Payer: Medicare Other | Source: Ambulatory Visit | Attending: Cardiovascular Disease | Admitting: Cardiovascular Disease

## 2019-09-25 DIAGNOSIS — Z01812 Encounter for preprocedural laboratory examination: Secondary | ICD-10-CM | POA: Diagnosis not present

## 2019-09-25 DIAGNOSIS — Z20822 Contact with and (suspected) exposure to covid-19: Secondary | ICD-10-CM | POA: Insufficient documentation

## 2019-09-25 LAB — SARS CORONAVIRUS 2 (TAT 6-24 HRS): SARS Coronavirus 2: NEGATIVE

## 2019-09-29 ENCOUNTER — Ambulatory Visit (HOSPITAL_BASED_OUTPATIENT_CLINIC_OR_DEPARTMENT_OTHER): Payer: Medicare Other

## 2019-09-29 ENCOUNTER — Other Ambulatory Visit: Payer: Self-pay

## 2019-09-29 ENCOUNTER — Ambulatory Visit (HOSPITAL_COMMUNITY): Payer: Medicare Other | Attending: Cardiology

## 2019-09-29 ENCOUNTER — Encounter (HOSPITAL_COMMUNITY): Payer: Self-pay | Admitting: *Deleted

## 2019-09-29 DIAGNOSIS — R079 Chest pain, unspecified: Secondary | ICD-10-CM | POA: Diagnosis not present

## 2019-09-29 DIAGNOSIS — I2510 Atherosclerotic heart disease of native coronary artery without angina pectoris: Secondary | ICD-10-CM | POA: Diagnosis not present

## 2019-09-29 DIAGNOSIS — Z9861 Coronary angioplasty status: Secondary | ICD-10-CM | POA: Diagnosis not present

## 2019-09-29 DIAGNOSIS — R0600 Dyspnea, unspecified: Secondary | ICD-10-CM | POA: Insufficient documentation

## 2019-09-29 LAB — ECHOCARDIOGRAM COMPLETE
Area-P 1/2: 2.86 cm2
P 1/2 time: 525 msec
S' Lateral: 3.5 cm

## 2019-09-29 MED ORDER — TECHNETIUM TC 99M TETROFOSMIN IV KIT
30.3000 | PACK | Freq: Once | INTRAVENOUS | Status: AC | PRN
  Administered 2019-09-29: 30.3 via INTRAVENOUS
  Filled 2019-09-29: qty 31

## 2019-09-30 ENCOUNTER — Ambulatory Visit (HOSPITAL_COMMUNITY): Payer: Medicare Other | Attending: Cardiology

## 2019-09-30 DIAGNOSIS — I2510 Atherosclerotic heart disease of native coronary artery without angina pectoris: Secondary | ICD-10-CM | POA: Insufficient documentation

## 2019-09-30 DIAGNOSIS — R0600 Dyspnea, unspecified: Secondary | ICD-10-CM | POA: Diagnosis not present

## 2019-09-30 DIAGNOSIS — Z9861 Coronary angioplasty status: Secondary | ICD-10-CM | POA: Diagnosis not present

## 2019-09-30 DIAGNOSIS — R079 Chest pain, unspecified: Secondary | ICD-10-CM | POA: Insufficient documentation

## 2019-09-30 LAB — MYOCARDIAL PERFUSION IMAGING
LV dias vol: 157 mL (ref 62–150)
LV sys vol: 69 mL
Peak HR: 75 {beats}/min
Rest HR: 62 {beats}/min
SDS: 2
SRS: 0
SSS: 2
TID: 1.05

## 2019-09-30 MED ORDER — TECHNETIUM TC 99M TETROFOSMIN IV KIT
31.3000 | PACK | Freq: Once | INTRAVENOUS | Status: AC | PRN
  Administered 2019-09-30: 31.3 via INTRAVENOUS
  Filled 2019-09-30: qty 32

## 2019-09-30 MED ORDER — REGADENOSON 0.4 MG/5ML IV SOLN
0.4000 mg | Freq: Once | INTRAVENOUS | Status: AC
  Administered 2019-09-30: 0.4 mg via INTRAVENOUS

## 2019-10-01 DIAGNOSIS — Z8616 Personal history of COVID-19: Secondary | ICD-10-CM | POA: Diagnosis not present

## 2019-10-01 DIAGNOSIS — I2510 Atherosclerotic heart disease of native coronary artery without angina pectoris: Secondary | ICD-10-CM | POA: Diagnosis not present

## 2019-10-01 DIAGNOSIS — G4733 Obstructive sleep apnea (adult) (pediatric): Secondary | ICD-10-CM | POA: Diagnosis not present

## 2019-10-01 DIAGNOSIS — Z87891 Personal history of nicotine dependence: Secondary | ICD-10-CM | POA: Diagnosis not present

## 2019-10-01 DIAGNOSIS — Z9981 Dependence on supplemental oxygen: Secondary | ICD-10-CM | POA: Diagnosis not present

## 2019-10-01 DIAGNOSIS — M6281 Muscle weakness (generalized): Secondary | ICD-10-CM | POA: Diagnosis not present

## 2019-10-01 DIAGNOSIS — I110 Hypertensive heart disease with heart failure: Secondary | ICD-10-CM | POA: Diagnosis not present

## 2019-10-01 DIAGNOSIS — J441 Chronic obstructive pulmonary disease with (acute) exacerbation: Secondary | ICD-10-CM | POA: Diagnosis not present

## 2019-10-01 DIAGNOSIS — I5032 Chronic diastolic (congestive) heart failure: Secondary | ICD-10-CM | POA: Diagnosis not present

## 2019-10-01 DIAGNOSIS — J9611 Chronic respiratory failure with hypoxia: Secondary | ICD-10-CM | POA: Diagnosis not present

## 2019-10-06 ENCOUNTER — Other Ambulatory Visit: Payer: Self-pay | Admitting: Family Medicine

## 2019-10-06 DIAGNOSIS — G4733 Obstructive sleep apnea (adult) (pediatric): Secondary | ICD-10-CM | POA: Diagnosis not present

## 2019-10-07 NOTE — Telephone Encounter (Signed)
Last visit 04/29/19 for leg pain

## 2019-10-19 DIAGNOSIS — M6281 Muscle weakness (generalized): Secondary | ICD-10-CM | POA: Diagnosis not present

## 2019-10-19 DIAGNOSIS — L905 Scar conditions and fibrosis of skin: Secondary | ICD-10-CM | POA: Diagnosis not present

## 2019-10-19 DIAGNOSIS — L820 Inflamed seborrheic keratosis: Secondary | ICD-10-CM | POA: Diagnosis not present

## 2019-10-19 DIAGNOSIS — D225 Melanocytic nevi of trunk: Secondary | ICD-10-CM | POA: Diagnosis not present

## 2019-10-19 DIAGNOSIS — D485 Neoplasm of uncertain behavior of skin: Secondary | ICD-10-CM | POA: Diagnosis not present

## 2019-10-19 DIAGNOSIS — L821 Other seborrheic keratosis: Secondary | ICD-10-CM | POA: Diagnosis not present

## 2019-10-19 DIAGNOSIS — L570 Actinic keratosis: Secondary | ICD-10-CM | POA: Diagnosis not present

## 2019-10-19 DIAGNOSIS — L814 Other melanin hyperpigmentation: Secondary | ICD-10-CM | POA: Diagnosis not present

## 2019-10-21 DIAGNOSIS — R0902 Hypoxemia: Secondary | ICD-10-CM | POA: Diagnosis not present

## 2019-10-21 DIAGNOSIS — J441 Chronic obstructive pulmonary disease with (acute) exacerbation: Secondary | ICD-10-CM | POA: Diagnosis not present

## 2019-10-21 DIAGNOSIS — J9601 Acute respiratory failure with hypoxia: Secondary | ICD-10-CM | POA: Diagnosis not present

## 2019-11-05 DIAGNOSIS — G4733 Obstructive sleep apnea (adult) (pediatric): Secondary | ICD-10-CM | POA: Diagnosis not present

## 2019-11-06 DIAGNOSIS — G4733 Obstructive sleep apnea (adult) (pediatric): Secondary | ICD-10-CM | POA: Diagnosis not present

## 2019-11-08 ENCOUNTER — Other Ambulatory Visit: Payer: Self-pay | Admitting: Family Medicine

## 2019-11-09 NOTE — Telephone Encounter (Signed)
Scheduled 11/1

## 2019-11-18 DIAGNOSIS — M6281 Muscle weakness (generalized): Secondary | ICD-10-CM | POA: Diagnosis not present

## 2019-11-19 ENCOUNTER — Other Ambulatory Visit: Payer: Self-pay

## 2019-11-19 ENCOUNTER — Ambulatory Visit (INDEPENDENT_AMBULATORY_CARE_PROVIDER_SITE_OTHER): Payer: Medicare Other | Admitting: Otolaryngology

## 2019-11-19 ENCOUNTER — Encounter (INDEPENDENT_AMBULATORY_CARE_PROVIDER_SITE_OTHER): Payer: Self-pay | Admitting: Otolaryngology

## 2019-11-19 VITALS — Temp 97.0°F

## 2019-11-19 DIAGNOSIS — H6123 Impacted cerumen, bilateral: Secondary | ICD-10-CM | POA: Diagnosis not present

## 2019-11-19 NOTE — Progress Notes (Signed)
HPI: [name] is a 79 y.o. male who presents for evaluation of wax buildup in both ears.  He was last cleaned about a year ago..  Past Medical History:  Diagnosis Date   Ankylosing spondylitis ([place]) 11/07/2018   Asthma    BPH (benign prostatic hyperplasia)    CAD (coronary artery disease) 01/1995   Clostridium difficile colitis 04/2005   Colitis 2011   COPD (chronic obstructive pulmonary disease) ([name])    Depression    Diverticulosis    DJD (degenerative joint disease)    Gastric ulcer 04/17/10   Three 37m gastric ulcers, H.pylori serologies were negative   GERD (gastroesophageal reflux disease)    History of kidney stones    Hyperlipidemia    Hypertension    Idiopathic chronic inflammatory bowel disease 05/18/2010   left-sided UC   Kidney stone    [place]   Obstructive sleep apnea    on Cpap   Reflux 02/1995   S/P endoscopy 07/24/10   retained gastric contents, benign bx   Past Surgical History:  Procedure Laterality Date   ANORECTAL MANOMETRY  2016   [facility]: concern for possible fissure. Noted pelvic floor dyssnyergy   BIOPSY  07/29/2017   Procedure: BIOPSY;  Surgeon: RDaneil Dolin MD;  [facility];  Service: Endoscopy;;  ascending and sigmoid colon   CARDIAC CATHETERIZATION     with stent   CARDIOVASCULAR STRESS TEST  07/21/2009   No scintigraphic evidence of inducible myocardial ischemia   CARPAL TUNNEL RELEASE Left 01/17/2017   Procedure: LEFT CARPAL TUNNEL RELEASE;  Surgeon: HCarole Civil MD;  Location: [facility];  Service: Orthopedics;  Laterality: Left;   CATARACT EXTRACTION, BILATERAL Bilateral    CERVICAL SPINE SURGERY     C4-5   COLONOSCOPY  04/2005   granularity and friability erosions from rectum to 40cm. Bx infection vs IBD. C. Diff positive at the time.    COLONOSCOPY  05/2010   [facility]: left-sided UC, bx with no dysplasia, shallow diverticula   COLONOSCOPY [name] 11/07/2012    RYKD:XIPJ-ASNKNproctocolitis status post segmental biopsy/Sigmoid colon polyps removed as described above. Procedure compromisd by technical difficulties. bx: Inflammation limited to sigmoid and rectum on pathology.   [facility] proct[facility]itis limited to sigmoid   COLONOSCOPY WITH PROPOFOL [name] 07/29/2017   diverticulosis in colon, three 4-6 mm polyps at splenic flexure and in cecum, one 10 mm polyp in rectum, abnormal rectum and sigmoid consistent with active UC   CORONARY STENT PLACEMENT  01/1995   ESOPHAGOGASTRODUODENOSCOPY  07/24/2010   RLZJ:QBHALPesophagus   ESOPHAGOGASTRODUODENOSCOPY  04/2014   [facility] small [facility] biopsies   FOOT SURGERY Bilateral two   KNEE SURGERY  two   POLYPECTOMY  07/29/2017   Procedure: POLYPECTOMY;  Surgeon: RDaneil Dolin MD;  [facility];  Service: Endoscopy;;  splenic flexure, ascending colon polyp;rectal   SHOULDER SURGERY  two   TONSILLECTOMY     TRANSTHORACIC ECHOCARDIOGRAM  03/23/2009   EF 60-65%, normal LV systolic function   ULNAR HEAD EXCISION Left 01/17/2017   Procedure: LEFT ULNAR HEAD RESECTION;  Surgeon: HCarole Civil MD;  Location: [facility];  Service: Orthopedics;  Laterality: Left;   Social History   Socioeconomic History   Marital status: Widowed    Spouse name: Not on file   Number of children: Not on file   Years of education: Not on file  Highest education level: Not on file  Occupational History   Occupation: maintenance    Comment: retired  Tobacco Use   Smoking status: Former Smoker    Packs/day: 1.00    Years: 20.00    Pack years: 20.00    Types: Cigarettes    Quit date: 09/30/1969    Years since quitting: 50.1   Smokeless tobacco: Never Used  Vaping Use   Vaping Use: Never used  Substance and Sexual Activity   Alcohol use: No   Drug use: No   Sexual activity: Yes    Partners: Female    Birth  control/protection: None    Comment: spouse  Other Topics Concern   Not on file  Social History Narrative   Not on file   Social Determinants of Health   Financial Resource Strain:    Difficulty of Paying Living Expenses: Not on file  Food Insecurity:    Worried About Charity fundraiser in the Last Year: Not on file   [employer] of Food in the Last Year: Not on file  Transportation Needs:    Lack of Transportation (Medical): Not on file   Lack of Transportation (Non-Medical): Not on file  Physical Activity:    Days of Exercise per Week: Not on file   Minutes of Exercise per Session: Not on file  Stress:    Feeling of Stress : Not on file  Social Connections:    Frequency of Communication with Friends and Family: Not on file   Frequency of Social Gatherings with Friends and Family: Not on file   Attends Religious Services: Not on file   Active Member of Clubs or Organizations: Not on file   Attends Archivist Meetings: Not on file   Marital Status: Not on file   Family History  Problem Relation Age of Onset   Lung cancer Mother    Cancer Mother        breast   Diabetes Mother    Stroke Father    Hypertension Father    Kidney failure Brother    Other Child        blood infection   Colon cancer Neg Hx    No Known Allergies Prior to Admission medications   Medication Sig Start Date End Date Taking? Authorizing Provider  acetaminophen (TYLENOL) 325 MG tablet Take 650 mg by mouth every 6 (six) hours as needed for moderate pain or headache.   Yes [provider]  albuterol (PROVENTIL) (2.5 MG/3ML) 0.083% nebulizer solution Take 3 mLs (2.5 mg total) by nebulization every 6 (six) hours as needed for wheezing or shortness of breath. 08/18/19  Yes Kathie Dike, MD  albuterol (VENTOLIN HFA) 108 (90 Base) MCG/ACT inhaler Inhale 2 puffs into the lungs every 6 (six) hours as needed for wheezing or shortness of breath. 12/23/18  Yes Sheth,  Devam P, MD  aspirin 81 MG tablet Take 1 tablet (81 mg total) by mouth at bedtime. Patient taking differently: Take 81 mg by mouth daily.  03/18/18  Yes Patrecia Pour, MD  benzonatate (TESSALON) 200 MG capsule Take 1 capsule (200 mg total) by mouth 2 (two) times daily as needed for cough. 04/13/19  Yes Oretha Milch D, MD  budesonide-formoterol (SYMBICORT) 160-4.5 MCG/ACT inhaler Inhale 2 puffs into the lungs in the morning and at bedtime. 08/18/19  Yes Kathie Dike, MD  cephALEXin (KEFLEX) 500 MG capsule Take 1 capsule (500 mg total) by mouth 3 (three) times daily. 09/15/19  Yes  Marzetta Board, DPM  clonazePAM (KLONOPIN) 0.5 MG tablet Take 1/2-1 tablet po BID PRN agitation. Caution:drowsiness; use sparingly Patient taking differently: Take 0.5 mg by mouth 2 (two) times daily as needed (agitation). Caution:drowsiness; use sparingly 07/09/19  Yes Luking, Elayne Snare, MD  clotrimazole (LOTRIMIN) 1 % cream Apply to affected area 2 times daily 04/22/19  Yes Carmin Muskrat, MD  doxazosin (CARDURA) 4 MG tablet TAKE 1/2 TABLET BY MOUTH EVERY EVENING 11/09/19  Yes Luking, Elayne Snare, MD  furosemide (LASIX) 40 MG tablet Take 1 tablet (40 mg total) by mouth every morning. 12/23/18  Yes Sheth, Devam P, MD  guaiFENesin (MUCINEX) 600 MG 12 hr tablet Take 1 tablet (600 mg total) by mouth 2 (two) times daily. 08/18/19  Yes Kathie Dike, MD  loperamide (IMODIUM) 2 MG capsule Take 6 mg by mouth daily.   Yes [provider]  metoprolol tartrate (LOPRESSOR) 50 MG tablet TAKE 1/2 TABLET BY MOUTH EVERY EVENING 11/09/19  Yes Luking, Elayne Snare, MD  Multiple Vitamin (MULTIVITAMIN WITH MINERALS) TABS tablet Take 1 tablet by mouth daily.   Yes [provider]  nitroGLYCERIN (NITROSTAT) 0.4 MG SL tablet Place 1 tablet (0.4 mg total) under the tongue every 5 (five) minutes as needed for chest pain. 09/09/19  Yes Lorretta Harp, MD  pravastatin (PRAVACHOL) 40 MG tablet Take 1 tablet (40 mg total) by mouth at  bedtime. 11/24/18  Yes Lendon Colonel, NP  predniSONE (DELTASONE) 10 MG tablet Take 44m po daily for 2 days then 32mdaily for 2 days then 2026maily for 2 days then 89m2mily for 2 days then stop 08/18/19  Yes Memon, JehaJolaine Artist  sertraline (ZOLOFT) 100 MG tablet TAKE 1 TABLET BY MOUTH DAILY 10/07/19  Yes Luking, ScotElayne Snare  tiotropium (SPIRIVA HANDIHALER) 18 MCG inhalation capsule One inhalation po daily Patient taking differently: Place 18 mcg into inhaler and inhale daily as needed (shortness of breath).  11/06/18  Yes Luking, ScotElayne Snare  Vitamin D, Ergocalciferol, (DRISDOL) 1.25 MG (50000 UNIT) CAPS capsule Take 50,000 Units by mouth every 7 (seven) days. Mondays   Yes [provider]  balsalazide (COLAZAL) 750 MG capsule Take 3 capsules (2,250 mg total) by mouth 3 (three) times daily. Patient taking differently: Take 2,250 mg by mouth in the morning and at bedtime.  04/16/19 09/09/19  BoonAnnitta Needs     Positive ROS: Otherwise negative  All other systems have been reviewed and were otherwise negative with the exception of those mentioned in the HPI and as above.  Physical Exam: Constitutional: Alert, well-appearing, no acute distress Ears: External ears without lesions or tenderness. Ear canals moderate Mehta wax buildup in both ear canals.  This was cleaned with suction and curettes.  TMs were otherwise clear.  Of note patient has a very stiff neck with minimal mobility.. Nasal: External nose without lesions. Clear nasal passages Oral: Oropharynx clear. Neck: No palpable adenopathy or masses Respiratory: Breathing comfortably  Skin: No facial/neck lesions or rash noted.  Cerumen impaction removal  Date/Time: 11/19/2019 4:40 PM Performed by: NewmRozetta Nunnery Authorized by: NewmRozetta Nunnery   Consent:    Consent obtained:  Verbal   Consent given by:  Patient   Risks discussed:  Pain and bleeding Procedure details:    Location:  L ear and R  ear   Procedure type: curette, suction and forceps   Post-procedure details:    Inspection:  TM intact and canal normal  Hearing quality:  Improved   Patient tolerance of procedure:  Tolerated well, no immediate complications Comments:     TMs clear bilaterally    Assessment: Bilateral cerumen impaction  Plan: This was cleaned in the office with suction and curettes.  Of note patient has a very stiff neck and may be better cleaned in a chair next time versus supine position on the table.  Radene Journey, MD

## 2019-11-20 DIAGNOSIS — J9601 Acute respiratory failure with hypoxia: Secondary | ICD-10-CM | POA: Diagnosis not present

## 2019-11-20 DIAGNOSIS — J441 Chronic obstructive pulmonary disease with (acute) exacerbation: Secondary | ICD-10-CM | POA: Diagnosis not present

## 2019-11-20 DIAGNOSIS — R0902 Hypoxemia: Secondary | ICD-10-CM | POA: Diagnosis not present

## 2019-11-27 ENCOUNTER — Other Ambulatory Visit: Payer: Self-pay | Admitting: Adult Health

## 2019-12-05 DIAGNOSIS — G4733 Obstructive sleep apnea (adult) (pediatric): Secondary | ICD-10-CM | POA: Diagnosis not present

## 2019-12-07 ENCOUNTER — Ambulatory Visit (INDEPENDENT_AMBULATORY_CARE_PROVIDER_SITE_OTHER): Payer: Medicare Other | Admitting: Family Medicine

## 2019-12-07 ENCOUNTER — Other Ambulatory Visit: Payer: Self-pay

## 2019-12-07 VITALS — BP 138/82

## 2019-12-07 DIAGNOSIS — N401 Enlarged prostate with lower urinary tract symptoms: Secondary | ICD-10-CM | POA: Diagnosis not present

## 2019-12-07 DIAGNOSIS — R7303 Prediabetes: Secondary | ICD-10-CM | POA: Diagnosis not present

## 2019-12-07 DIAGNOSIS — J449 Chronic obstructive pulmonary disease, unspecified: Secondary | ICD-10-CM

## 2019-12-07 DIAGNOSIS — K5190 Ulcerative colitis, unspecified, without complications: Secondary | ICD-10-CM | POA: Diagnosis not present

## 2019-12-07 DIAGNOSIS — E611 Iron deficiency: Secondary | ICD-10-CM | POA: Diagnosis not present

## 2019-12-07 DIAGNOSIS — Z79899 Other long term (current) drug therapy: Secondary | ICD-10-CM | POA: Diagnosis not present

## 2019-12-07 DIAGNOSIS — E785 Hyperlipidemia, unspecified: Secondary | ICD-10-CM | POA: Diagnosis not present

## 2019-12-07 DIAGNOSIS — I5032 Chronic diastolic (congestive) heart failure: Secondary | ICD-10-CM | POA: Diagnosis not present

## 2019-12-07 DIAGNOSIS — E559 Vitamin D deficiency, unspecified: Secondary | ICD-10-CM | POA: Diagnosis not present

## 2019-12-07 MED ORDER — PRAVASTATIN SODIUM 40 MG PO TABS
40.0000 mg | ORAL_TABLET | Freq: Every day | ORAL | 3 refills | Status: DC
Start: 2019-12-07 — End: 2020-10-30

## 2019-12-07 MED ORDER — CLONAZEPAM 0.5 MG PO TABS
0.5000 mg | ORAL_TABLET | Freq: Two times a day (BID) | ORAL | 3 refills | Status: DC | PRN
Start: 2019-12-07 — End: 2020-04-01

## 2019-12-07 MED ORDER — ALBUTEROL SULFATE HFA 108 (90 BASE) MCG/ACT IN AERS: 2.0000 | INHALATION_SPRAY | Freq: Four times a day (QID) | RESPIRATORY_TRACT | 5 refills | Status: DC | PRN

## 2019-12-07 MED ORDER — METOPROLOL TARTRATE 50 MG PO TABS
ORAL_TABLET | ORAL | 5 refills | Status: DC
Start: 2019-12-07 — End: 2020-06-27

## 2019-12-07 MED ORDER — DOXAZOSIN MESYLATE 4 MG PO TABS
ORAL_TABLET | ORAL | 6 refills | Status: DC
Start: 2019-12-07 — End: 2020-02-25

## 2019-12-07 MED ORDER — SERTRALINE HCL 100 MG PO TABS
ORAL_TABLET | ORAL | 5 refills | Status: DC
Start: 2019-12-07 — End: 2020-01-26

## 2019-12-07 NOTE — Progress Notes (Signed)
Subjective:    Patient ID: [name], male    DOB: [birth year], 79 y.o.   MRN: [id number]  Hypertension This is a chronic problem. The current episode started more than 1 year ago.  This patient has some difficult challenging issues He does have blood pressure issues but recently has been on a lower dose because his blood pressure has been on the low and at times he gets dizzy when he stands up but he is on a moderate amount of diuretics which could well be contributing to this issue  He does have underlying ulcerative colitis and is on medication but unfortunately the medication at times has not done as well to help keep his symptoms under control plus also at times the cost is prohibitive currently he sees a gastroenterologist in [place] but he lives in [place] and would prefer to see gastroenterology in [place]  Patient does take his cholesterol medicine on a regular basis tries to be reasonable with what he eats  Patient has cervical spine pain and discomfort hurts with certain movements and rotations will be seeing neurosurgery in the near future for evaluation for possible injections  Patient does take 81 mg aspirin because of heart disease no GI bleeding issues  Patient does suffer with detachment and some depression as well takes his sertraline on a regular basis.  Family is trying to get him to be more interactive with others but patient rightfully so is concerned about Covid we did discuss the importance of getting his booster    Review of Systems Please see above denies any chest tightness pressure pain relates swelling in the legs relates some intermittent nausea intermittent diarrhea issues denies blood in stools denies headaches denies PND orthopnea.  Does relate some feeling of feeling detached and feeling down because he cannot do much    Objective:   Physical Exam Lungs clear respiratory rate normal heart is regular extremities some pedal edema abdomen  soft obese       Assessment & Plan:  1. Chronic diastolic CHF (congestive heart failure) ([name]) CHF under good control currently.  Watch salt in diet stay physically active trying to keep as active as his body will allow - Ambulatory referral to diabetic education - CBC with Differential/Platelet - Basic metabolic panel  2. Chronic obstructive pulmonary disease, unspecified COPD type ([place]) Has underlying COPD but stable currently not having any flareups  3. Ulcerative colitis without complications, unspecified location [facility]) We will go ahead and refer to [facility] gastroenterology for further evaluation plus also ongoing care of his ulcerative colitis - Ambulatory referral to Gastroenterology - CBC with Differential/Platelet - Hepatic function panel - Basic metabolic panel  4. Benign prostatic hyperplasia with lower urinary tract symptoms, symptom details unspecified Continue doxazosin blood pressure on the low end patient only taking half a tablet daily if passing out spells or anything alarming immediately notify us  5. Dyslipidemia, goal LDL below 70 Continue medication watch diet - Ambulatory referral to diabetic education - Hepatic function panel - Basic metabolic panel  6. Morbid obesity ([place]) Watch portions try to reduce weight by watching portions also hopefully dietitian can teach him things to eat that would not exacerbate his diarrhea he was given a low residue diet printout - Ambulatory referral to diabetic education  7. Prediabetes See above - Ambulatory referral to diabetic education - Basic metabolic panel  8. High risk medication use Continue current medications - CBC with Differential/Platelet - Hepatic function panel - Basic metabolic  panel 30 minutes spent with patient including chart review documentation and time spent with patient

## 2019-12-08 LAB — CBC WITH DIFFERENTIAL/PLATELET
Basophils Absolute: 0.1 10*3/uL (ref 0.0–0.2)
Basos: 1 %
EOS (ABSOLUTE): 0.5 10*3/uL — ABNORMAL HIGH (ref 0.0–0.4)
Eos: 5 %
Hematocrit: 42.6 % (ref 37.5–51.0)
Hemoglobin: 14.1 g/dL (ref 13.0–17.7)
Immature Grans (Abs): 0.1 10*3/uL (ref 0.0–0.1)
Immature Granulocytes: 1 %
Lymphocytes Absolute: 3 10*3/uL (ref 0.7–3.1)
Lymphs: 28 %
MCH: 28.6 pg (ref 26.6–33.0)
MCHC: 33.1 g/dL (ref 31.5–35.7)
MCV: 86 fL (ref 79–97)
Monocytes Absolute: 0.9 10*3/uL (ref 0.1–0.9)
Monocytes: 8 %
Neutrophils Absolute: 6.2 10*3/uL (ref 1.4–7.0)
Neutrophils: 57 %
Platelets: 222 10*3/uL (ref 150–450)
RBC: 4.93 x10E6/uL (ref 4.14–5.80)
RDW: 14.1 % (ref 11.6–15.4)
WBC: 10.8 10*3/uL (ref 3.4–10.8)

## 2019-12-08 LAB — HEPATIC FUNCTION PANEL
ALT: 10 IU/L (ref 0–44)
AST: 13 IU/L (ref 0–40)
Albumin: 3.9 g/dL (ref 3.6–4.6)
Alkaline Phosphatase: 83 IU/L (ref 44–121)
Bilirubin Total: 0.5 mg/dL (ref 0.0–1.2)
Bilirubin, Direct: 0.17 mg/dL (ref 0.00–0.40)
Total Protein: 7 g/dL (ref 6.0–8.5)

## 2019-12-08 LAB — BASIC METABOLIC PANEL
BUN/Creatinine Ratio: 15 (ref 10–24)
BUN: 14 mg/dL (ref 8–27)
CO2: 27 mmol/L (ref 20–29)
Calcium: 9.9 mg/dL (ref 8.6–10.2)
Chloride: 103 mmol/L (ref 96–106)
Creatinine, Ser: 0.96 mg/dL (ref 0.76–1.27)
GFR calc Af Amer: 85 mL/min/{1.73_m2} (ref >59)
GFR calc non Af Amer: 74 mL/min/{1.73_m2} (ref >59)
Glucose: 107 mg/dL — ABNORMAL HIGH (ref 65–99)
Potassium: 4.4 mmol/L (ref 3.5–5.2)
Sodium: 142 mmol/L (ref 134–144)

## 2019-12-09 ENCOUNTER — Telehealth: Payer: Self-pay

## 2019-12-09 MED ORDER — MUPIROCIN 2 % EX OINT: TOPICAL_OINTMENT | CUTANEOUS | 4 refills | Status: DC

## 2019-12-09 NOTE — Progress Notes (Deleted)
Cardiology Clinic Note   Patient Name: [name] Date of Encounter: 12/09/2019  Primary Care Provider:  [name], MD Primary Cardiologist:  Quay Burow, MD  Patient Profile    [name] 79 year old male presents to the clinic today for follow-up evaluation of his CAD and review of his [place] and echocardiogram.  Past Medical History    Past Medical History:  Diagnosis Date  . Ankylosing spondylitis ([place]) 11/07/2018  . Asthma   . BPH (benign prostatic hyperplasia)   . CAD (coronary artery disease) 01/1995  . Clostridium difficile colitis 04/2005  . Colitis 2011  . COPD (chronic obstructive pulmonary disease) ([place])   . Depression   . Diverticulosis   . DJD (degenerative joint disease)   . Gastric ulcer 04/17/10   Three 68m gastric ulcers, H.pylori serologies were negative  . GERD (gastroesophageal reflux disease)   . History of kidney stones   . Hyperlipidemia   . Hypertension   . Idiopathic chronic inflammatory bowel disease 05/18/2010   left-sided UC  . Kidney stone   . [place] 03/12/[place]  . Obstructive sleep apnea    on Cpap  . Reflux 02/1995  . S/P endoscopy 07/24/10   retained gastric contents, benign bx   Past Surgical History:  Procedure Laterality Date  . ANORECTAL MANOMETRY  2016   [facility]: concern for possible fissure. Noted pelvic floor dyssnyergy  . BIOPSY  07/29/2017   Procedure: BIOPSY;  Surgeon: RDaneil Dolin MD;  Location: [facility];  Service: Endoscopy;;  ascending and sigmoid colon  . CARDIAC CATHETERIZATION     with stent  . CARDIOVASCULAR STRESS TEST  07/21/2009   No scintigraphic evidence of inducible myocardial ischemia  . CARPAL TUNNEL RELEASE Left 01/17/2017   Procedure: LEFT CARPAL TUNNEL RELEASE;  Surgeon: HCarole Civil MD;  Location: [facility] ORS;  Service: Orthopedics;  Laterality: Left;  . CATARACT EXTRACTION, BILATERAL Bilateral   . CERVICAL SPINE SURGERY     C4-5  . COLONOSCOPY  04/2005    granularity and friability erosions from rectum to 40cm. Bx infection vs IBD. C. Diff positive at the time.   . COLONOSCOPY  05/2010   [facility]: left-sided UC, bx with no dysplasia, shallow diverticula  . COLONOSCOPY [name] 11/07/2012   RJHE:RDEY-CXKGYproctocolitis status post segmental biopsy/Sigmoid colon polyps removed as described above. Procedure compromisd by technical difficulties. bx: Inflammation limited to sigmoid and rectum on pathology.  . COLONOSCOPY  04[facility]2014   [facility] proctocolitis limited to sigmoid  . COLONOSCOPY WITH PROPOFOL [name] 07/29/2017   diverticulosis in colon, three 4-6 mm polyps at splenic flexure and in cecum, one 10 mm polyp in rectum, abnormal rectum and sigmoid consistent with active UC  . CORONARY STENT PLACEMENT  01/1995  . ESOPHAGOGASTRODUODENOSCOPY  07/24/2010   RJEH:UDJSHFesophagus  . ESOPHAGOGASTRODUODENOSCOPY  04/2014   [facility] bowel [facility]  . FOOT SURGERY Bilateral two  . KNEE SURGERY  two  . POLYPECTOMY  07/29/2017   Procedure: POLYPECTOMY;  Surgeon: RDaneil Dolin MD;  Location[facility];  Service: Endoscopy;;  splenic flexure, ascending colon polyp;rectal  . SHOULDER SURGERY  two  . TONSILLECTOMY    . TRANSTHORACIC ECHOCARDIOGRAM  03/23/2009   EF 60-65%, normal LV systolic function  . ULNAR HEAD EXCISION Left 01/17/2017   Procedure: LEFT ULNAR HEAD RESECTION;  Surgeon: HCarole Civil MD;  Location: [facility];  Service: Orthopedics;  Laterality: Left;  Allergies  No Known Allergies  History of Present Illness    [name] has a past medical history of coronary artery disease status post coronary angioplasty/stenting LAD 12/96, essential hypertension, dyslipidemia, chronic diastolic CHF, and OSA on CPAP.  He was last seen by Dr. Gwenlyn Found on 09/09/2019.  During that time he denied chest pain and shortness of breath.  His echocardiogram 2007 showed normal LV function.  Nuclear stress test  6/11 was normal.  His PCP follows his lipids.  Lipid panel on 10/20 showed cholesterol of 137 LDL 66 and an HDL of 49.  He contracted COVID-19 in early March and was hospitalized.  During his prior visit he was still recovering from his infection.  He did complain of atypical reflux type symptoms that did not appear to be anginal.  He was also hospitalized for 2 days 08/16/2019 with respiratory failure.  This was felt to be multifactorial due to his COPD and diastolic CHF.  He complained of occasional chest pain and was noted to have chronic shortness of breath.  His blood pressure was somewhat low during the evaluation.  Echocardiogram 09/29/2019 showed normal LV function, G1 DD, and mild dilation of the aortic root.  Nuclear stress test 09/30/2019 showed an EF of 56%, no change in EKG with infusion related to T wave abnormality, and low risk.  He presents to the clinic today for follow-up evaluation states***  *** denies chest pain, shortness of breath, lower extremity edema, fatigue, palpitations, melena, hematuria, hemoptysis, diaphoresis, weakness, presyncope, syncope, orthopnea, and PND.   Home Medications    Prior to Admission medications   Medication Sig Start Date End Date Taking? Authorizing Provider  acetaminophen (TYLENOL) 325 MG tablet Take 650 mg by mouth every 6 (six) hours as needed for moderate pain or headache.    [provider]  albuterol (PROVENTIL) (2.5 MG/3ML) 0.083% nebulizer solution Take 3 mLs (2.5 mg total) by nebulization every 6 (six) hours as needed for wheezing or shortness of breath. 08/18/19   Kathie Dike, MD  albuterol (VENTOLIN HFA) 108 (90 Base) MCG/ACT inhaler Inhale 2 puffs into the lungs every 6 (six) hours as needed for wheezing or shortness of breath. 12/07/19   [name], MD  aspirin 81 MG tablet Take 1 tablet (81 mg total) by mouth at bedtime. Patient taking differently: Take 81 mg by mouth daily.  03/18/18   Patrecia Pour, MD  balsalazide  (COLAZAL) 750 MG capsule Take 3 capsules (2,250 mg total) by mouth 3 (three) times daily. Patient taking differently: Take 2,250 mg by mouth in the morning and at bedtime.  04/16/19 09/09/19  Annitta Needs, NP  benzonatate (TESSALON) 200 MG capsule Take 1 capsule (200 mg total) by mouth 2 (two) times daily as needed for cough. 04/13/19   Desiree Hane, MD  budesonide-formoterol (SYMBICORT) 160-4.5 MCG/ACT inhaler Inhale 2 puffs into the lungs in the morning and at bedtime. 08/18/19   Kathie Dike, MD  clonazePAM (KLONOPIN) 0.5 MG tablet Take 1 tablet (0.5 mg total) by mouth 2 (two) times daily as needed (agitation). Caution:drowsiness; use sparingly 12/07/19   [name], MD  clotrimazole (LOTRIMIN) 1 % cream Apply to affected area 2 times daily 04/22/19   Carmin Muskrat, MD  doxazosin (CARDURA) 4 MG tablet TAKE 1/2 TABLET BY MOUTH EVERY EVENING 12/07/19   [name], MD  furosemide (LASIX) 40 MG tablet TAKE 1/2 TABLET BY MOUTH DAILY 11/27/19   Lendon Colonel, NP  guaiFENesin [facility])  600 MG 12 hr tablet Take 1 tablet (600 mg total) by mouth 2 (two) times daily. 08/18/19   Kathie Dike, MD  loperamide (IMODIUM) 2 MG capsule Take 6 mg by mouth daily.    [provider]  metoprolol tartrate (LOPRESSOR) 50 MG tablet TAKE 1/2 TABLET BY MOUTH EVERY EVENING 12/07/19   [name], MD  Multiple Vitamin (MULTIVITAMIN WITH MINERALS) TABS tablet Take 1 tablet by mouth daily.    [provider]  nitroGLYCERIN (NITROSTAT) 0.4 MG SL tablet Place 1 tablet (0.4 mg total) under the tongue every 5 (five) minutes as needed for chest pain. 09/09/19   Lorretta Harp, MD  pravastatin (PRAVACHOL) 40 MG tablet Take 1 tablet (40 mg total) by mouth at bedtime. 12/07/19   [name], MD  sertraline (ZOLOFT) 100 MG tablet 1 and 1/2 daily 12/07/19   Luking, Elayne Snare, MD  tiotropium (SPIRIVA HANDIHALER) 18 MCG inhalation capsule One inhalation po daily Patient taking differently: Place 18  mcg into inhaler and inhale daily as needed (shortness of breath).  11/06/18   [name], MD  Vitamin D, Ergocalciferol, (DRISDOL) 1.25 MG (50000 UNIT) CAPS capsule Take 50,000 Units by mouth every 7 (seven) days. Mondays    [provider]    Family History    Family History  Problem Relation Age of Onset  . Lung cancer Mother   . Cancer Mother        breast  . Diabetes Mother   . Stroke Father   . Hypertension Father   . Kidney failure Brother   . Other Child        blood infection  . Colon cancer Neg Hx    He indicated that his mother is deceased. He indicated that his father is deceased. He indicated that his brother is deceased. He indicated that two of his three children are alive. He indicated that the status of his neg hx is unknown.  Social History    Social History   Socioeconomic History  . Marital status: Widowed    Spouse name: Not on file  . Number of children: Not on file  . Years of education: Not on file  . Highest education level: Not on file  Occupational History  . Occupation: maintenance    Comment: retired  Tobacco Use  . Smoking status: Former Smoker    Packs/day: 1.00    Years: 20.00    Pack years: 20.00    Types: Cigarettes    Quit date: 09/30/1969    Years since quitting: 50.2  . Smokeless tobacco: Never Used  Vaping Use  . Vaping Use: Never used  Substance and Sexual Activity  . Alcohol use: No  . Drug use: No  . Sexual activity: Yes    Partners: Female    Birth control/protection: None    Comment: spouse  Other Topics Concern  . Not on file  Social History Narrative  . Not on file   Social Determinants of Health   Financial Resource Strain:   . Difficulty of Paying Living Expenses: Not on file  Food Insecurity:   . Worried About Charity fundraiser in the Last Year: Not on file  . Ran Out of Food in the Last Year: Not on file  Transportation Needs:   . Lack of Transportation (Medical): Not on file  . Lack of  Transportation (Non-Medical): Not on file  Physical Activity:   . Days of Exercise per Week: Not on  file  . Minutes of Exercise per Session: Not on file  Stress:   . Feeling of Stress : Not on file  Social Connections:   . Frequency of Communication with Friends and Family: Not on file  . Frequency of Social Gatherings with Friends and Family: Not on file  . Attends Religious Services: Not on file  . Active Member of Clubs or Organizations: Not on file  . Attends Archivist Meetings: Not on file  . Marital Status: Not on file  Intimate Partner Violence:   . Fear of Current or Ex-Partner: Not on file  . Emotionally Abused: Not on file  . Physically Abused: Not on file  . Sexually Abused: Not on file     Review of Systems    General:  No chills, fever, night sweats or weight changes.  Cardiovascular:  No chest pain, dyspnea on exertion, edema, orthopnea, palpitations, paroxysmal nocturnal dyspnea. Dermatological: No rash, lesions/masses Respiratory: No cough, dyspnea Urologic: No hematuria, dysuria Abdominal:   No nausea, vomiting, diarrhea, bright red blood per rectum, melena, or hematemesis Neurologic:  No visual changes, wkns, changes in mental status. All other systems reviewed and are otherwise negative except as noted above.  Physical Exam    VS:  There were no vitals taken for this visit. , BMI There is no height or weight on file to calculate BMI. GEN: Well nourished, well developed, in no acute distress. HEENT: normal. Neck: Supple, no JVD, carotid bruits, or masses. Cardiac: RRR, no murmurs, rubs, or gallops. No clubbing, cyanosis, edema.  Radials/DP/PT 2+ and equal bilaterally.  Respiratory:  Respirations regular and unlabored, clear to auscultation bilaterally. GI: Soft, nontender, nondistended, BS + x 4. MS: no deformity or atrophy. Skin: warm and dry, no rash. Neuro:  Strength and sensation are intact. Psych: Normal affect.  Accessory Clinical  Findings    Recent Labs: 04/09/2019: Magnesium 2.0 08/16/2019: B Natriuretic Peptide 185.4 12/07/2019: ALT 10; BUN 14; Creatinine, Ser 0.96; Hemoglobin 14.1; Platelets 222; Potassium 4.4; Sodium 142   Recent Lipid Panel    Component Value Date/Time   CHOL 137 11/07/2018 1140   TRIG 107 04/08/2019 2249   HDL 49 11/07/2018 1140   CHOLHDL 2.8 11/07/2018 1140   CHOLHDL 3.2 06/08/2015 0907   VLDL 33 (H) 06/08/2015 0907   LDLCALC 66 11/07/2018 1140    ECG personally reviewed by me today- *** - No acute changes  Echocardiogram 09/29/2019 IMPRESSIONS    1. Left ventricular ejection fraction, by estimation, is 60 to 65%. The  left ventricle has normal function. The left ventricle has no regional  wall motion abnormalities. There is moderate left ventricular hypertrophy.  Left ventricular diastolic  parameters are consistent with Grade I diastolic dysfunction (impaired  relaxation).  2. Right ventricular systolic function is normal. The right ventricular  size is normal. Tricuspid regurgitation signal is inadequate for assessing  PA pressure.  3. Left atrial size was mildly dilated.  4. The mitral valve is normal in structure. Trivial mitral valve  regurgitation. No evidence of mitral stenosis.  5. The aortic valve is tricuspid. Aortic valve regurgitation is not  visualized. Mild aortic valve sclerosis is present, with no evidence of  aortic valve stenosis.  6. Aortic dilatation noted. There is mild dilatation of the aortic root.  7. The inferior vena cava is normal in size with greater than 50%  respiratory variability, suggesting right atrial pressure of 3 mmHg.  Nuclear stress test 09/30/2019  The left ventricular ejection fraction  is normal (55-65%).  Nuclear stress EF: 56%.  Baseline EKG demonstrates NSR with inferolateral T wave abnormality. There is no change in EKG with infusion.  The study is normal.  This is a low risk study.   Assessment & Plan   1.   Coronary artery disease-no chest pain today.  No recent episodes of chest discomfort arm, or neck pain.  Underwent nuclear stress test 09/30/2018 which showed low risk and an EF of 56%. Continue aspirin, metoprolol, pravastatin, Heart healthy low-sodium diet-salty 6 given Increase physical activity as tolerated  Chronic diastolic CHF-no increased dyspnea or work of breathing today.  Echocardiogram 09/29/2019 showed normal EF. Continue furosemide, metoprolol Heart healthy low-sodium diet-salty 6 given Increase physical activity as tolerated  Essential hypertension-BP today***.  Well-controlled at home.  Amlodipine previously discontinued due to complaints of dizziness. Continue metoprolol, Cardura, Heart healthy low-sodium diet-salty 6 given Increase physical activity as tolerated  Hyperlipidemia-04/08/2019: Triglycerides 107, LDL 66 on 10/20 Continue pravastatin Heart healthy low-sodium high-fiber diet Increase physical activity as tolerated  Obstructive sleep apnea-reports compliance with CPAP.  Feels well rested when he wakes. Continue CPAP use  Disposition: Follow-up with Dr. Gwenlyn Found or me in 6 months.   Jossie Ng. Niajah Sipos NP-C    12/09/2019, 6:57 AM [address] [address] Office [phone] Fax [phone]  Notice: This dictation was prepared with Dragon dictation along with smaller phrase technology. Any transcriptional errors that result from this process are unintentional and may not be corrected upon review.

## 2019-12-09 NOTE — Telephone Encounter (Signed)
Please advise. Thank you

## 2019-12-09 NOTE — Telephone Encounter (Signed)
[name] has a spot on is private area that is bleeding spot is getting bigger today. Angie thinks that he slept with poop which has mad the raw spot and wanting to know what they can to for this.    Angie call back [phone]

## 2019-12-09 NOTE — Telephone Encounter (Signed)
May try applying Bactroban ointment Bactroban ointment 22 g tube apply twice daily to the raw area.  4 refills  May take several days to improve but if it does not improve or gets worse to notify us

## 2019-12-09 NOTE — Telephone Encounter (Signed)
Pt daughter [name] contacted and verbalized understanding.

## 2019-12-10 ENCOUNTER — Ambulatory Visit: Payer: Medicare Other | Admitting: General Practice

## 2019-12-11 ENCOUNTER — Telehealth: Payer: Self-pay

## 2019-12-11 NOTE — Telephone Encounter (Signed)
Diarrhea for 5 days. Pt does not want to go to hospital. Daughter states he is weak and has been shaking. States he was this way visit 2 days. States he is cold. Temp was 99.8 today. Blood in diarrhea, no vomiting, he is having mid abdominal pain, took 7 immodium yesterdays and 4 immodiums today. Daughter states diarrhea smells like something dead. Has gone through 30 pads in 5 days. He is eating. Ate yogurt and oatmeal today. Has not had any diarrhea today since taking 4 immodium tablets around 11am.

## 2019-12-11 NOTE — Telephone Encounter (Signed)
this is an unfortunate situation I am very sympathetic to what is going on But more than likely this is a severe flareup of his colitis. He is at significant risk of major complications due to his underlying health issues, the frequency of the diarrhea, the symptoms he is having could put him at risk for sepsis or dehydration or kidney failure.  I would recommend the patient go to the ER for further evaluation to be on the safe side

## 2019-12-11 NOTE — Telephone Encounter (Signed)
Discussed with pt's daughter [name] and she verbalized understanding and states he will listen to dr Nicki Reaper and go to the ED.

## 2019-12-11 NOTE — Telephone Encounter (Signed)
[name] had diarrhea for the past two weeks and daughter [name] is wanting advise on what to do. He was seen here on 11/01   Call back number [phone]

## 2019-12-17 LAB — SPECIMEN STATUS REPORT

## 2019-12-19 DIAGNOSIS — M6281 Muscle weakness (generalized): Secondary | ICD-10-CM | POA: Diagnosis not present

## 2019-12-20 LAB — SPECIMEN STATUS REPORT

## 2019-12-20 LAB — VITAMIN D 25 HYDROXY (VIT D DEFICIENCY, FRACTURES): Vit D, 25-Hydroxy: 30.6 ng/mL (ref 30.0–100.0)

## 2019-12-20 LAB — FERRITIN: Ferritin: 475 ng/mL — ABNORMAL HIGH (ref 30–400)

## 2019-12-21 DIAGNOSIS — J441 Chronic obstructive pulmonary disease with (acute) exacerbation: Secondary | ICD-10-CM | POA: Diagnosis not present

## 2019-12-21 DIAGNOSIS — R0902 Hypoxemia: Secondary | ICD-10-CM | POA: Diagnosis not present

## 2019-12-21 DIAGNOSIS — J9601 Acute respiratory failure with hypoxia: Secondary | ICD-10-CM | POA: Diagnosis not present

## 2019-12-25 DIAGNOSIS — K51311 Ulcerative (chronic) rectosigmoiditis with rectal bleeding: Secondary | ICD-10-CM | POA: Diagnosis not present

## 2019-12-25 DIAGNOSIS — R197 Diarrhea, unspecified: Secondary | ICD-10-CM | POA: Diagnosis not present

## 2019-12-25 DIAGNOSIS — R1310 Dysphagia, unspecified: Secondary | ICD-10-CM | POA: Diagnosis not present

## 2019-12-29 DIAGNOSIS — K51311 Ulcerative (chronic) rectosigmoiditis with rectal bleeding: Secondary | ICD-10-CM | POA: Diagnosis not present

## 2019-12-29 DIAGNOSIS — R197 Diarrhea, unspecified: Secondary | ICD-10-CM | POA: Diagnosis not present

## 2020-01-04 ENCOUNTER — Other Ambulatory Visit: Payer: Self-pay | Admitting: Physician Assistant

## 2020-01-04 DIAGNOSIS — K51311 Ulcerative (chronic) rectosigmoiditis with rectal bleeding: Secondary | ICD-10-CM

## 2020-01-05 DIAGNOSIS — G4733 Obstructive sleep apnea (adult) (pediatric): Secondary | ICD-10-CM | POA: Diagnosis not present

## 2020-01-07 ENCOUNTER — Ambulatory Visit
Admission: RE | Admit: 2020-01-07 | Discharge: 2020-01-07 | Disposition: A | Discharge status: Home / Self Care | Payer: Medicare Other | Source: Ambulatory Visit | Attending: Physician Assistant | Admitting: Physician Assistant

## 2020-01-07 DIAGNOSIS — K51311 Ulcerative (chronic) rectosigmoiditis with rectal bleeding: Secondary | ICD-10-CM

## 2020-01-07 DIAGNOSIS — K5730 Diverticulosis of large intestine without perforation or abscess without bleeding: Secondary | ICD-10-CM | POA: Diagnosis not present

## 2020-01-07 DIAGNOSIS — I714 Abdominal aortic aneurysm, without rupture: Secondary | ICD-10-CM | POA: Diagnosis not present

## 2020-01-07 DIAGNOSIS — N200 Calculus of kidney: Secondary | ICD-10-CM | POA: Diagnosis not present

## 2020-01-07 DIAGNOSIS — K8020 Calculus of gallbladder without cholecystitis without obstruction: Secondary | ICD-10-CM | POA: Diagnosis not present

## 2020-01-07 MED ORDER — IOPAMIDOL (ISOVUE-300) INJECTION 61%
100.0000 mL | Freq: Once | INTRAVENOUS | Status: AC | PRN
  Administered 2020-01-07: 100 mL via INTRAVENOUS

## 2020-01-12 DIAGNOSIS — K51311 Ulcerative (chronic) rectosigmoiditis with rectal bleeding: Secondary | ICD-10-CM | POA: Diagnosis not present

## 2020-01-12 DIAGNOSIS — K219 Gastro-esophageal reflux disease without esophagitis: Secondary | ICD-10-CM | POA: Diagnosis not present

## 2020-01-18 DIAGNOSIS — M6281 Muscle weakness (generalized): Secondary | ICD-10-CM | POA: Diagnosis not present

## 2020-01-19 ENCOUNTER — Encounter: Payer: Medicare Other | Attending: Family Medicine | Admitting: Nutrition

## 2020-01-19 ENCOUNTER — Encounter: Payer: Self-pay | Admitting: Nutrition

## 2020-01-19 ENCOUNTER — Other Ambulatory Visit: Payer: Self-pay

## 2020-01-19 VITALS — Ht 71.0 in | Wt 284.0 lb

## 2020-01-19 DIAGNOSIS — E785 Hyperlipidemia, unspecified: Secondary | ICD-10-CM

## 2020-01-19 DIAGNOSIS — I5022 Chronic systolic (congestive) heart failure: Secondary | ICD-10-CM

## 2020-01-19 DIAGNOSIS — R7303 Prediabetes: Secondary | ICD-10-CM

## 2020-01-19 DIAGNOSIS — I10 Essential (primary) hypertension: Secondary | ICD-10-CM

## 2020-01-19 NOTE — Patient Instructions (Signed)
Goals Eat breakfast by 9 am  Cut out processed foods Cut out bacon, sausage, bologn Cut out fast foods Cook more foods from home. Increase fresh fruits and vegetables Drink only water-3 bottles of water per day Use Mrs Deliah Boston and cut out salt Lose 1 lbs per week.

## 2020-01-19 NOTE — Progress Notes (Signed)
Medical Nutrition Therapy   Primary concerns today: CHF, Prediabetes and Obesity. 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 Living with his daughter and  Husband. Referral diagnosis: CHF, Obesity, PreDm Preferred learning style:  no preference indicated) Learning readiness:  ready)  Sees Dr. Wolfgang Phoenix, PCP. Admits to being depressed. Takes Sertaline but daughter doesn't think it's improved much. He notes he feels down and depressed due to losing his daughter and none of his kinfolk come see him.  His daughter and her husband are his care takers and he lives with them.  NUTRITION ASSESSMENT   Anthropometrics  71" Wt 284 lbs Wt Readings from Last 3 Encounters:  01/19/20 284 lb (128.8 kg)  09/29/19 284 lb (128.8 kg)  09/09/19 284 lb (128.8 kg)   Ht Readings from Last 3 Encounters:  01/19/20 5' 11"  (1.803 m)  09/29/19 6' 1"  (1.854 m)  09/09/19 6' 1"  (1.854 m)   Body mass index is 39.61 kg/m. @BMIFA @ Facility age limit for growth percentiles is 20 years. Facility age limit for growth percentiles is 20 years.   Clinical Medical Hx: Obesity, CHF, COPD, Sleep Apnea, CAD, Ulcerative colitis Medications: see list  Labs:  Lab Results  Component Value Date   HGBA1C 5.9 (H) 12/22/2018   CMP Latest Ref Rng & Units 12/07/2019 08/18/2019 08/17/2019  Glucose 65 - 99 mg/dL 107(H) 141(H) 140(H)  BUN 8 - 27 mg/dL 14 17 19   Creatinine 0.76 - 1.27 mg/dL 0.96 0.94 0.94  Sodium 134 - 144 mmol/L 142 140 138  Potassium 3.5 - 5.2 mmol/L 4.4 5.1 3.9  Chloride 96 - 106 mmol/L 103 105 106  CO2 20 - 29 mmol/L 27 23 24   Calcium 8.6 - 10.2 mg/dL 9.9 9.8 8.6(L)  Total Protein 6.0 - 8.5 g/dL 7.0 7.1 6.1(L)  Total Bilirubin 0.0 - 1.2 mg/dL 0.5 0.3 0.2(L)  Alkaline Phos 44 - 121 IU/L 83 55 50  AST 0 - 40 IU/L 13 21 17   ALT 0 - 44 IU/L 10 10 8    Lipid Panel     Component Value Date/Time   CHOL 137 11/07/2018 1140   TRIG 107 04/08/2019 2249   HDL 49 11/07/2018 1140   CHOLHDL 2.8 11/07/2018 1140    CHOLHDL 3.2 06/08/2015 0907   VLDL 33 (H) 06/08/2015 0907   LDLCALC 66 11/07/2018 1140   LABVLDL 22 11/07/2018 1140    Notable Signs/Symptoms: SOB, swelling at times, weight gain  Lifestyle & Dietary Hx Sedentary. Sleeps a lot   Estimated daily fluid intake: 24  oz Supplements:  Sleep: A lot  Stress / self-care: Depressed  Current average weekly physical activity: ADL   24-Hr Dietary Recall First Meal: Biscuit with egg and cheese, Sausage and coffee Snack:  Second Meal: Cornbread and buttermilk,  water Snack Third Meal:BBQ from cookout, onion rings, Milkshake, Snack:  Beverages: 64 oz of water per day.  Estimated Energy Needs Calories: 1600  Carbohydrate: 180g Protein: 120g Fat: 44g   NUTRITION DIAGNOSIS  NI-5.10.2 Excessive mineral intake (specify): Sodium As related to High salt diet.  As evidenced by CHF chronic.   NUTRITION INTERVENTION  Nutrition education (E-1) on the following topics:   Nutrition and Diabetes education provided on My Plate, CHO counting, meal planning, portion sizes, timing of meals, Prediabetes and prevention of DM.  Low Salt Diet   Handouts Provided Include   Heart Healthy Low Sodium Handout  Learning Style & Readiness for Change Teaching method utilized: Visual & Auditory  Demonstrated degree of understanding via: Teach  Back  Barriers to learning/adherence to lifestyle change: depression  Goals Established by Pt Goals Eat breakfast by 9 am  Cut out processed foods Cut out bacon, sausage, bologn Cut out fast foods Cook more foods from home. Increase fresh fruits and vegetables Drink only water-3 bottles of water per day Use Mrs Deliah Boston and cut out salt Lose 1 lbs per week.   MONITORING & EVALUATION Dietary intake, weekly physical activity, in 1 month.

## 2020-01-20 DIAGNOSIS — R0902 Hypoxemia: Secondary | ICD-10-CM | POA: Diagnosis not present

## 2020-01-20 DIAGNOSIS — J441 Chronic obstructive pulmonary disease with (acute) exacerbation: Secondary | ICD-10-CM | POA: Diagnosis not present

## 2020-01-20 DIAGNOSIS — J9601 Acute respiratory failure with hypoxia: Secondary | ICD-10-CM | POA: Diagnosis not present

## 2020-01-25 ENCOUNTER — Telehealth: Payer: Self-pay | Admitting: Family Medicine

## 2020-01-25 NOTE — Telephone Encounter (Signed)
Depression very common among elderly people with multiple health issues.  Medications can help to some degree.  I would recommend increasing sertraline, 100 mg tablet, take 2 daily, send in prescription with 6 months worth on refills  I also think it would be a good idea for them to consider doing a virtual visit If they would like to do one Thursday morning that would be fine otherwise it would be into early January If they are interested please help set this up

## 2020-01-25 NOTE — Telephone Encounter (Signed)
Patient daughter states he is still depressed even when you increased his medication wanting to know what else she can do for him. Please advise

## 2020-01-26 MED ORDER — SERTRALINE HCL 100 MG PO TABS
ORAL_TABLET | ORAL | 0 refills | Status: DC
Start: 2020-01-26 — End: 2020-02-29

## 2020-01-26 NOTE — Telephone Encounter (Signed)
Prescription sent electronically to pharmacy. Patient scheduled for virtual visit with Dr Nicki Reaper 01/28/20 at 9:30am

## 2020-01-27 DIAGNOSIS — M5459 Other low back pain: Secondary | ICD-10-CM | POA: Diagnosis not present

## 2020-01-27 DIAGNOSIS — M542 Cervicalgia: Secondary | ICD-10-CM | POA: Diagnosis not present

## 2020-01-28 ENCOUNTER — Other Ambulatory Visit: Payer: Self-pay | Admitting: Specialist

## 2020-01-28 ENCOUNTER — Telehealth (INDEPENDENT_AMBULATORY_CARE_PROVIDER_SITE_OTHER): Payer: Medicare Other | Admitting: Family Medicine

## 2020-01-28 ENCOUNTER — Other Ambulatory Visit: Payer: Self-pay

## 2020-01-28 ENCOUNTER — Ambulatory Visit
Admission: RE | Admit: 2020-01-28 | Discharge: 2020-01-28 | Disposition: A | Discharge status: Home / Self Care | Payer: Medicare Other | Source: Ambulatory Visit | Attending: Specialist | Admitting: Specialist

## 2020-01-28 DIAGNOSIS — Z981 Arthrodesis status: Secondary | ICD-10-CM | POA: Diagnosis not present

## 2020-01-28 DIAGNOSIS — F321 Major depressive disorder, single episode, moderate: Secondary | ICD-10-CM

## 2020-01-28 DIAGNOSIS — M542 Cervicalgia: Secondary | ICD-10-CM | POA: Diagnosis not present

## 2020-01-28 DIAGNOSIS — M47812 Spondylosis without myelopathy or radiculopathy, cervical region: Secondary | ICD-10-CM | POA: Diagnosis not present

## 2020-01-28 NOTE — Progress Notes (Signed)
   Subjective:    Patient ID: [name], male    DOB: [birth year], 79 y.o.   MRN: [id number]  HPI  Virtual Visit via Telephone Note  I connected with [name] on 01/28/20 at  9:30 AM EST by telephone and verified that I am speaking with the correct person using two identifiers.  Location: Patient: home Provider: office   I discussed the limitations, risks, security and privacy concerns of performing an evaluation and management service by telephone and the availability of in person appointments. I also discussed with the patient that there may be a patient responsible charge related to this service. The patient expressed understanding and agreed to proceed.   History of Present Illness:    Observations/Objective:   Assessment and Plan:   Follow Up Instructions:    I discussed the assessment and treatment plan with the patient. The patient was provided an opportunity to ask questions and all were answered. The patient agreed with the plan and demonstrated an understanding of the instructions.   The patient was advised to call back or seek an in-person evaluation if the symptoms worsen or if the condition fails to improve as anticipated.  I provided 20 minutes of non-face-to-face time during this encounter. Patient calling to discuss depression.  Patient also mentions seeing Dr. Tonita Cong yesterday and was told he had a broken neck. He is wearing a neck brace and waiting to hear a plan from the Dr.  Lennox Pippins nice patient Having a lot of difficulties adjusting to declining health Family does a wonderful job of taking care of him We had a good discussion over the phone regarding how he has been doing he is trying to maintain a positive attitude but is very difficult He finds himself feeling depressed but recently we bumped up the dose of the medicine and he is hopeful that this will help   Review of Systems     Objective:   Physical Exam Today's visit was via  telephone Physical exam was not possible for this visit        Assessment & Plan:  Moderate depression Zoloft as directed Family support Difficult for the patient to go to any counseling because of his declining health He does the best he can to either looking at magazines or watching TV during the day We will do a follow-up with him in 4 weeks

## 2020-02-04 DIAGNOSIS — G4733 Obstructive sleep apnea (adult) (pediatric): Secondary | ICD-10-CM | POA: Diagnosis not present

## 2020-02-05 DIAGNOSIS — G4733 Obstructive sleep apnea (adult) (pediatric): Secondary | ICD-10-CM | POA: Diagnosis not present

## 2020-02-16 DIAGNOSIS — M542 Cervicalgia: Secondary | ICD-10-CM | POA: Diagnosis not present

## 2020-02-18 DIAGNOSIS — M6281 Muscle weakness (generalized): Secondary | ICD-10-CM | POA: Diagnosis not present

## 2020-02-20 DIAGNOSIS — J441 Chronic obstructive pulmonary disease with (acute) exacerbation: Secondary | ICD-10-CM | POA: Diagnosis not present

## 2020-02-20 DIAGNOSIS — J9601 Acute respiratory failure with hypoxia: Secondary | ICD-10-CM | POA: Diagnosis not present

## 2020-02-20 DIAGNOSIS — R0902 Hypoxemia: Secondary | ICD-10-CM | POA: Diagnosis not present

## 2020-02-25 ENCOUNTER — Telehealth: Payer: Self-pay | Admitting: *Deleted

## 2020-02-25 ENCOUNTER — Telehealth: Payer: Self-pay

## 2020-02-25 ENCOUNTER — Telehealth (INDEPENDENT_AMBULATORY_CARE_PROVIDER_SITE_OTHER): Payer: Medicare Other | Admitting: Family Medicine

## 2020-02-25 DIAGNOSIS — F324 Major depressive disorder, single episode, in partial remission: Secondary | ICD-10-CM

## 2020-02-25 DIAGNOSIS — R55 Syncope and collapse: Secondary | ICD-10-CM

## 2020-02-25 NOTE — Telephone Encounter (Signed)
[name] called her dad don't remember from phone visit this morning which pill to stop taking? She is thinking it is metoprolol tartrate (LOPRESSOR) 50 MG tablet but she is not sure she wants clarification   [name] call back [phone]

## 2020-02-25 NOTE — Progress Notes (Signed)
   Subjective:    Patient ID: [name], male    DOB: Aug 26, 1940, 80 y.o.   MRN: [id number]  HPI  Patient calls for a follow up on depression.  He relates his moods are doing better Denies being severely depressed States medication is helping  Patient states he fell yesterday because he jumped up in a hurry and fell and busted the tank on the toilet and skinned his hand.  Patient states at times he gets dizzy when he is standing and he feels like he is going to fall He wonders if it could be related to medicine He did have a evaluation by EMS workers who determine that his blood pressure was doing good   Virtual Visit via Telephone Note  I connected with [name] on 02/25/20 at 10:00 AM EST by telephone and verified that I am speaking with the correct person using two identifiers.  Location: Patient: home  Provider: office   I discussed the limitations, risks, security and privacy concerns of performing an evaluation and management service by telephone and the availability of in person appointments. I also discussed with the patient that there may be a patient responsible charge related to this service. The patient expressed understanding and agreed to proceed.   History of Present Illness:    Observations/Objective:   Assessment and Plan:   Follow Up Instructions:    I discussed the assessment and treatment plan with the patient. The patient was provided an opportunity to ask questions and all were answered. The patient agreed with the plan and demonstrated an understanding of the instructions.   The patient was advised to call back or seek an in-person evaluation if the symptoms worsen or if the condition fails to improve as anticipated.  I provided 20 including chart review documentation discussing with patient minutes of non-face-to-face time during this encounter.      Review of Systems Intermittent dizziness Has fallen out once Denies chest  tightness pressure pain shortness of breath swelling States his depression doing better     Objective:   Physical Exam Today's visit was via telephone Physical exam was not possible for this visit  Labs from recent drawl were reviewed no additional labs currently      Assessment & Plan:  Stop doxazosin Follow-up to 3 weeks phone to see if his dizziness is doing better Follow-up sooner if problems [employer] doing in person visit in approximately 2 months if family able to bring him here to the office

## 2020-02-25 NOTE — Telephone Encounter (Signed)
Doxazosin This can increase the risk of dizziness and passing out It would be best for him to hold off for this over the next 2 to 3 weeks We will be touching base again in about 2 weeks time Certainly if they have any further questions concerns issues please let me know

## 2020-02-25 NOTE — Telephone Encounter (Signed)
[name] advised per Dr Nicki Reaper: Hold on Doxazosin This can increase the risk of dizziness and passing out It would be best for him to hold off for this over the next 2 to 3 weeks We will be touching base again in about 2 weeks time [name] verbalized understanding.

## 2020-02-25 NOTE — Telephone Encounter (Signed)
Mr. [name], [name] are scheduled for a virtual visit with your provider today.    Just as we do with appointments in the office, we must obtain your consent to participate.  Your consent will be active for this visit and any virtual visit you may have with one of our providers in the next 365 days.    If you have a MyChart account, I can also send a copy of this consent to you electronically.  All virtual visits are billed to your insurance company just like a traditional visit in the office.  As this is a virtual visit, video technology does not allow for your provider to perform a traditional examination.  This may limit your provider's ability to fully assess your condition.  If your provider identifies any concerns that need to be evaluated in person or the need to arrange testing such as labs, EKG, etc, we will make arrangements to do so.    Although advances in technology are sophisticated, we cannot ensure that it will always work on either your end or our end.  If the connection with a video visit is poor, we may have to switch to a telephone visit.  With either a video or telephone visit, we are not always able to ensure that we have a secure connection.   I need to obtain your verbal consent now.   Are you willing to proceed with your visit today?   [name] has provided verbal consent on 02/25/2020 for a virtual visit (video or telephone).

## 2020-02-28 ENCOUNTER — Other Ambulatory Visit: Payer: Self-pay | Admitting: Family Medicine

## 2020-02-29 MED ORDER — SERTRALINE HCL 100 MG PO TABS: ORAL_TABLET | ORAL | 0 refills | Status: DC

## 2020-02-29 NOTE — Addendum Note (Signed)
Addended by: Patsy Lager on: 02/29/2020 03:33 PM   Modules accepted: Orders

## 2020-03-01 ENCOUNTER — Ambulatory Visit: Payer: Medicare Other | Admitting: Nutrition

## 2020-03-07 DIAGNOSIS — M47812 Spondylosis without myelopathy or radiculopathy, cervical region: Secondary | ICD-10-CM | POA: Insufficient documentation

## 2020-03-18 ENCOUNTER — Telehealth (INDEPENDENT_AMBULATORY_CARE_PROVIDER_SITE_OTHER): Payer: Medicare Other | Admitting: Family Medicine

## 2020-03-18 ENCOUNTER — Other Ambulatory Visit: Payer: Self-pay

## 2020-03-18 DIAGNOSIS — R531 Weakness: Secondary | ICD-10-CM | POA: Diagnosis not present

## 2020-03-18 NOTE — Progress Notes (Signed)
   Subjective:    Patient ID: [name], male    DOB: 04-30-1940, 80 y.o.   MRN: [id number]  HPI  Patient's daughter calls for a follow up on syncope. Daughter states he has fell 3 times and one of those times he broke the toilet and the fire department had to get him up. Patient has stopped the doxazosin. Had good discussion with family Having frequent troubles with weakness in the legs at times falling down.  Difficult time moving around.  Does not pass out.  Denies chest tightness pressure pain just weakness does not move much does not have much activity entering into difficult phase in life  Virtual Visit via Telephone Note  I connected with [name] on 03/18/20 at  2:00 PM EST by telephone and verified that I am speaking with the correct person using two identifiers.  Location: Patient: Home Provider: Office   I discussed the limitations, risks, security and privacy concerns of performing an evaluation and management service by telephone and the availability of in person appointments. I also discussed with the patient that there may be a patient responsible charge related to this service. The patient expressed understanding and agreed to proceed.   History of Present Illness:    Observations/Objective:   Assessment and Plan:   Follow Up Instructions:    I discussed the assessment and treatment plan with the patient. The patient was provided an opportunity to ask questions and all were answered. The patient agreed with the plan and demonstrated an understanding of the instructions.   The patient was advised to call back or seek an in-person evaluation if the symptoms worsen or if the condition fails to improve as anticipated.  I provided 15 minutes of non-face-to-face time during this encounter.      Review of Systems Please see above    Objective:   Physical Exam    Today's visit was via telephone Physical exam was not possible for this  visit     Assessment & Plan:  Leg weakness Recommend getting up as much as possible with walker Proper nutrition Check blood pressure periodically Recommend office visit next Wednesday at 1130

## 2020-03-20 DIAGNOSIS — M6281 Muscle weakness (generalized): Secondary | ICD-10-CM | POA: Diagnosis not present

## 2020-03-22 DIAGNOSIS — J9601 Acute respiratory failure with hypoxia: Secondary | ICD-10-CM | POA: Diagnosis not present

## 2020-03-22 DIAGNOSIS — R0902 Hypoxemia: Secondary | ICD-10-CM | POA: Diagnosis not present

## 2020-03-22 DIAGNOSIS — J441 Chronic obstructive pulmonary disease with (acute) exacerbation: Secondary | ICD-10-CM | POA: Diagnosis not present

## 2020-03-23 ENCOUNTER — Ambulatory Visit (INDEPENDENT_AMBULATORY_CARE_PROVIDER_SITE_OTHER): Payer: Medicare Other | Admitting: Family Medicine

## 2020-03-23 ENCOUNTER — Other Ambulatory Visit: Payer: Self-pay

## 2020-03-23 VITALS — BP 130/58

## 2020-03-23 DIAGNOSIS — R269 Unspecified abnormalities of gait and mobility: Secondary | ICD-10-CM | POA: Diagnosis not present

## 2020-03-23 DIAGNOSIS — R296 Repeated falls: Secondary | ICD-10-CM | POA: Diagnosis not present

## 2020-03-23 DIAGNOSIS — K51311 Ulcerative (chronic) rectosigmoiditis with rectal bleeding: Secondary | ICD-10-CM

## 2020-03-23 DIAGNOSIS — I10 Essential (primary) hypertension: Secondary | ICD-10-CM | POA: Diagnosis not present

## 2020-03-23 DIAGNOSIS — J449 Chronic obstructive pulmonary disease, unspecified: Secondary | ICD-10-CM | POA: Diagnosis not present

## 2020-03-23 DIAGNOSIS — R531 Weakness: Secondary | ICD-10-CM

## 2020-03-23 DIAGNOSIS — M450 Ankylosing spondylitis of multiple sites in spine: Secondary | ICD-10-CM

## 2020-03-23 DIAGNOSIS — I5032 Chronic diastolic (congestive) heart failure: Secondary | ICD-10-CM | POA: Diagnosis not present

## 2020-03-23 DIAGNOSIS — R251 Tremor, unspecified: Secondary | ICD-10-CM | POA: Diagnosis not present

## 2020-03-23 NOTE — Progress Notes (Signed)
   Subjective:    Patient ID: [name], male    DOB: Sep 08, 1940, 80 y.o.   MRN: [id number]  HPI Pt here for follow up on weakness and frequent falls.   Review of Systems     Objective:   Physical Exam        Assessment & Plan:

## 2020-03-23 NOTE — Progress Notes (Signed)
Subjective:    Patient ID: [name], male    DOB: [birth year], 80 y.o.   MRN: [id number]  HPI  Patient arrives for a follow up on weakness and falls. Patient states he is also having problems with tremors. Tremor - Plan: Ambulatory referral to [place], Ambulatory referral to Neurology  Morbid obesity ([place]), Chronic  Chronic ulcerative rectosigmoiditis with rectal bleeding ([name]), Chronic  Chronic diastolic CHF (congestive heart failure) ([place]), Chronic  Chronic obstructive pulmonary disease, unspecified COPD type ([place]), Chronic  Ankylosing spondylitis of multiple sites in spine ([place]), Chronic  Abnormal gait - Plan: Ambulatory referral to [place], Ambulatory referral to Neurology  Weakness - Plan: Ambulatory referral to [employer], Ambulatory referral to Neurology  Frequent falls - Plan: Ambulatory referral to [place], Ambulatory referral to Neurology  Essential hypertension  So this patient has significant underlying health issues that make it very difficult for him from day-to-day. He does have morbid obesity he is trying to do the best he can at watching diet it is very difficult for him to lose weight because he cannot be physically active He has extreme weakness in his legs and he has noticed increasing difficulties with tremors in his hands making it difficult to write difficult to feed himself In addition to this he also relates that it is difficult for him to pick up his feet.  He has had several falls where his legs collapsed from under him He also has underlying heart disease tries to keep this under control medication sees his cardiologist on a regular basis Has significant related anxiety and depression in partial remission.  Takes his medication He does relate finding himself fatigued tired  Fall Risk  02/25/2020 01/19/2020 12/07/2019 11/07/2018 03/12/2018  Falls in the past year? 1 1 0 0 1  Number falls in past yr: 1 1 - - 1  Injury with Fall? 0 1 -  - 1  Risk Factor Category  - - - - -  Risk for fall due to : History of fall(s);Impaired balance/gait Impaired balance/gait;Impaired mobility Impaired balance/gait;Impaired mobility Impaired balance/gait History of fall(s)  Follow up Falls evaluation completed;Education provided Falls prevention discussed Falls evaluation completed;Education provided Falls evaluation completed Falls evaluation completed    Review of Systems  Constitutional: Positive for fatigue. Negative for activity change.  HENT: Negative for congestion and rhinorrhea.   Respiratory: Positive for shortness of breath. Negative for cough.   Cardiovascular: Positive for leg swelling. Negative for chest pain.  Gastrointestinal: Negative for abdominal pain, diarrhea, nausea and vomiting.  Genitourinary: Negative for dysuria and hematuria.  Neurological: Positive for dizziness, tremors and weakness. Negative for headaches.  Psychiatric/Behavioral: Negative for behavioral problems and confusion.       Objective:   Physical Exam Vitals reviewed.  Constitutional:      General: He is not in acute distress.    Appearance: He is well-nourished.  HENT:     Head: Normocephalic and atraumatic.  Eyes:     General:        Right eye: No discharge.        Left eye: No discharge.  Neck:     Trachea: No tracheal deviation.  Cardiovascular:     Rate and Rhythm: Normal rate and regular rhythm.     Heart sounds: Normal heart sounds. No murmur heard.   Pulmonary:     Effort: Pulmonary effort is normal. No respiratory distress.     Breath sounds: Normal breath sounds.  Musculoskeletal:  General: No edema.  Lymphadenopathy:     Cervical: No cervical adenopathy.  Skin:    General: Skin is warm and dry.  Neurological:     Mental Status: He is alert.     Coordination: Coordination normal.  Psychiatric:        Mood and Affect: Mood and affect normal.        Behavior: Behavior normal.   Some pedal edema noted in the  ankles and feet  Mild tremor noted in the hands. I observe the patient he had difficult time standing up He also has trouble with walking he uses a walker but he shuffles with his feet and does not pick them up well It is difficult to tell if he has cogwheeling but he does have some stiffness in the arms      Assessment & Plan:  1. Tremor Tremor is present concerning for the possibility of Parkinson's when taking into account his abnormal gait as well as weakness plus also some mild orthostasis when he stands therefore referral to neurology for further evaluation for Parkinson's disease - Ambulatory referral to [place] - Ambulatory referral to Neurology  2. Morbid obesity ([place]) Portion control stay physically active doubtful that he will lose much weight because of his age  86. Chronic ulcerative rectosigmoiditis with rectal bleeding [facility]) He is followed by [facility] gastroenterology for his ulcerative colitis and they seem to be doing a good job of taking care of him currently  4. Chronic diastolic CHF (congestive heart failure) ([name]) Chronic CHF issues but currently stable.  Continue current medications as is.  5. Chronic obstructive pulmonary disease, unspecified COPD type ([place]) Severe COPD Symbicort on a regular basis minimize albuterol only use when necessary because that can contribute to his tremor  6. Ankylosing spondylitis of multiple sites in spine [facility]) Unfortunately he is settled with significant ankylosing spondylitis and as a result he is not really able to do much physical activity in terms of turning his head standing walking but he will do the best he can  7. Abnormal gait We will go ahead and get physical therapy to consult in on him he has had multiple falls and he does exhibit weakness in his legs when he walks and is at risk of falling - Ambulatory referral to [place] - Ambulatory referral to Neurology  8. Weakness It would be wise if this patient 5 days a  week would practice getting up walking in the house setting back down thing repeating again and again and again - Ambulatory referral to [place] - Ambulatory referral to Neurology  9. Frequent falls We did talk about safety including removing anything that triggers the risk of falling with family - Ambulatory referral to [place] - Ambulatory referral to Neurology  10. Essential hypertension Blood pressure decent control  Follow-up in 3 months

## 2020-03-30 DIAGNOSIS — M47812 Spondylosis without myelopathy or radiculopathy, cervical region: Secondary | ICD-10-CM | POA: Diagnosis not present

## 2020-03-30 DIAGNOSIS — Z981 Arthrodesis status: Secondary | ICD-10-CM | POA: Diagnosis not present

## 2020-03-30 NOTE — Progress Notes (Signed)
Assessment/Plan:   1.  Tremor  -likely ET.  Patient has had this for over a decade.  It is really fairly mild, although daughter states that it does wax and wane.  We discussed nature and pathophysiology.  Discussed various treatments, although I really did not recommend any in him.  I think that the risk: Benefit ratio does not favor him and patient/daughter agree.  -no evidence of Parkinsons Disease and reassured them of this today.  This is really the primary thing that they wanted to hear.  -Patient has multiple other reasons to have gait instability, including deconditioning, degenerative arthritis, ankylosing spondylitis.  I agree with physical therapy, which they plan to start up again.  However, discussed with him that physical therapy will be of benefit if he does not do anything after the physical therapist leaves.  Discussed exercise program.  2.  F/u prn  Subjective:   [name] was seen today in the movement disorders clinic for neurologic consultation at the request of Luking, Elayne Snare, MD.  The consultation is for the evaluation of tremor and gait instability and to r/o PD.   This patient is accompanied in the office by his child who supplements the history. Outside records that were made available to me were reviewed.  Patient is 80 years old male with a history of chronic ulcerative rectosigmoiditis, congestive heart failure, morbid obesity, ankylosing spondylitis, COPD who presents for the evaluation of the above complaints.   Specific Symptoms:  Tremor: Yes.    How long has it been going on? 12-13 years  rest or with activation?  activation When is it noted the most?  Taking medication, eating Fam hx of tremor?  Yes.   , brother Located where?  Bilateral UE Affected by caffeine:  No. Affected by alcohol: doesn't drink alcohol Affected by stress:  No. Affected by fatigue:  Yes.   Spills soup if on spoon:  Yes.   Tremor inducing medications: Yes, Symbicort,  Spiriva, albuterol (daughter states that using all prn now and not using often b/c patient states makes him tremor more)  Other questions: Voice: no change Sleep: sleeps day and night Postural symptoms:  Yes.    Falls?  Yes.   x several years; never fell with the walker Bradykinesia symptoms: shuffling gait, slow movements and difficulty getting out of a chair Loss of smell:  Yes.   Loss of taste:  No. Urinary Incontinence:  No. but has frequency Difficulty Swallowing:  Yes.   Trouble with ADL's:  Yes.   due to [name] clothing: Yes.   Memory changes:  Yes.  , trouble with words, remember pills; daughters do finances since 2005; pt lives with daughter/husband x 2 years due to falls and FTT Lightheaded:  Yes.  , His doxazosin was just stopped on January 20 for this but that didn't help; have also cut back on amlodipine and metoprolol Diplopia:  Yes.   - doesn't go away if shuts eye Dyskinesia:  No.    Last neuroimaging of the brain was CT brain in July, 2018 which was unremarkable.  Do not see any MRIs brain   ALLERGIES:  No Known Allergies  CURRENT MEDICATIONS:  Current Outpatient Medications  Medication Instructions  . acetaminophen (TYLENOL) 650 mg, Oral, Every 6 hours PRN  . albuterol (PROVENTIL) 2.5 mg, Nebulization, Every 6 hours PRN  . albuterol (VENTOLIN HFA) 108 (90 Base) MCG/ACT inhaler 2 puffs, Inhalation, Every 6 hours PRN  .  aspirin 81 mg, Oral, Daily at bedtime  . benzonatate (TESSALON) 200 mg, Oral, 2 times daily PRN  . clotrimazole (LOTRIMIN) 1 % cream Apply to affected area 2 times daily  . furosemide (LASIX) 40 MG tablet TAKE 1/2 TABLET BY MOUTH DAILY  . loperamide (IMODIUM) 4 mg, Oral, 2 times daily  . mesalamine (LIALDA) 4.8 g, Oral, Daily  . metoprolol tartrate (LOPRESSOR) 50 MG tablet TAKE 1/2 TABLET BY MOUTH EVERY EVENING  . Multiple Vitamin (MULTIVITAMIN WITH MINERALS) TABS tablet 1 tablet, Oral, Daily  . mupirocin ointment (BACTROBAN) 2 %  Apply BID to raw area  . nitroGLYCERIN (NITROSTAT) 0.4 mg, Sublingual, Every 5 min PRN  . pravastatin (PRAVACHOL) 40 mg, Oral, Daily at bedtime  . sertraline (ZOLOFT) 100 MG tablet TAKE 2 TABLETS BY MOUTH DAILY  . tiotropium (SPIRIVA HANDIHALER) 18 MCG inhalation capsule One inhalation po daily  . Vitamin D (Ergocalciferol) (DRISDOL) 50,000 Units, Every 7 days    Objective:   VITALS:   Vitals:   04/01/20 1328  BP: 140/64  Pulse: 67  SpO2: 95%  Weight: 283 lb (128.4 kg)  Height: 6' 1"  (1.854 m)    GEN:  The patient appears stated age and is in NAD. HEENT:  Normocephalic, atraumatic.  The mucous membranes are moist. The superficial temporal arteries are without ropiness or tenderness. CV:  RRR Lungs:  CTAB.  There is dyspnea on exertion. Neck/HEME:  There are no carotid bruits bilaterally.  Neurological examination:  Orientation: The patient is alert and oriented x3.  Cranial nerves: There is good facial symmetry. Extraocular muscles are intact. The visual fields are full to confrontational testing. The speech is fluent and clear. Soft palate rises symmetrically and there is no tongue deviation. Hearing is decreased to conversational tone. Sensation: Sensation is intact to light and pinprick throughout (facial, trunk, extremities). Vibration is intact at the bilateral big toe. There is no extinction with double simultaneous stimulation. There is no sensory dermatomal level identified. Motor: Strength is 5/5 in the bilateral upper and lower extremities with the exception of shoulder abduction bilaterally (rotator cuff issue).   Shoulder shrug is equal and symmetric.  There is no pronator drift. Deep tendon reflexes: Deep tendon reflexes are 2/4 at the bilateral biceps, triceps, brachioradialis, patella and absent at the bilateral achilles. Plantar responses are downgoing bilaterally.  Movement examination: Tone: There is normal tone in the bilateral upper extremities.  The tone in the  lower extremities is normal.  Abnormal movements: There is no rest tremor.  There is very little postural tremor.  There is very little intention tremor.  He really has no tremor with Archimedes spirals. Coordination:  There is no decremation with RAM's, with any form of RAMS, including alternating supination and pronation of the forearm, hand opening and closing, finger taps, heel taps and toe taps. Gait and Station: The patient pushes off of the chair to arise.  He is given his walker and walks fairly well with that.  When he is out in the hall, the walker is removed.  While he is somewhat short stepped, he does not shuffle.  He is wide-based.   I have reviewed and interpreted the following labs independently   Chemistry      Component Value Date/Time   NA 142 12/07/2019 1500   K 4.4 12/07/2019 1500   CL 103 12/07/2019 1500   CO2 27 12/07/2019 1500   BUN 14 12/07/2019 1500   CREATININE 0.96 12/07/2019 1500   CREATININE 1.09 06/08/2015  1030      Component Value Date/Time   CALCIUM 9.9 12/07/2019 1500   ALKPHOS 83 12/07/2019 1500   AST 13 12/07/2019 1500   ALT 10 12/07/2019 1500   BILITOT 0.5 12/07/2019 1500      Lab Results  Component Value Date   TSH 1.655 04/12/2010   Lab Results  Component Value Date   WBC 10.8 12/07/2019   HGB 14.1 12/07/2019   HCT 42.6 12/07/2019   MCV 86 12/07/2019   PLT 222 12/07/2019     Total time spent on today's visit was 45 minutes, including both face-to-face time and nonface-to-face time.  Time included that spent on review of records (prior notes available to me/labs/imaging if pertinent), discussing treatment and goals, answering patient's questions and coordinating care.  Cc:  [name], MD

## 2020-03-31 DIAGNOSIS — L720 Epidermal cyst: Secondary | ICD-10-CM | POA: Diagnosis not present

## 2020-04-01 ENCOUNTER — Other Ambulatory Visit: Payer: Self-pay

## 2020-04-01 ENCOUNTER — Encounter: Payer: Self-pay | Admitting: Neurology

## 2020-04-01 ENCOUNTER — Ambulatory Visit: Payer: Medicare Other | Admitting: Neurology

## 2020-04-01 VITALS — BP 140/64 | HR 67 | Ht 73.0 in | Wt 283.0 lb

## 2020-04-01 DIAGNOSIS — R2681 Unsteadiness on feet: Secondary | ICD-10-CM | POA: Diagnosis not present

## 2020-04-01 DIAGNOSIS — G250 Essential tremor: Secondary | ICD-10-CM

## 2020-04-01 NOTE — Patient Instructions (Signed)

## 2020-04-06 ENCOUNTER — Encounter: Payer: Self-pay | Admitting: Podiatry

## 2020-04-06 ENCOUNTER — Ambulatory Visit: Payer: Medicare Other | Admitting: Podiatry

## 2020-04-06 ENCOUNTER — Other Ambulatory Visit: Payer: Self-pay

## 2020-04-06 DIAGNOSIS — B351 Tinea unguium: Secondary | ICD-10-CM

## 2020-04-06 DIAGNOSIS — M79676 Pain in unspecified toe(s): Secondary | ICD-10-CM | POA: Diagnosis not present

## 2020-04-13 ENCOUNTER — Other Ambulatory Visit: Payer: Self-pay | Admitting: *Deleted

## 2020-04-13 ENCOUNTER — Telehealth: Payer: Self-pay

## 2020-04-13 DIAGNOSIS — R296 Repeated falls: Secondary | ICD-10-CM

## 2020-04-13 NOTE — Telephone Encounter (Signed)
Called and discussed with pt's daughter [name] and she verbalized understanding. Referrals put in.

## 2020-04-13 NOTE — Progress Notes (Signed)
Subjective: Patient presents to clinic today cc of painful, discolored, thick toenails which interfere with daily activities. Pain is aggravated when wearing enclosed shoe gear. Pain is getting progressively worse and relieved with periodic professional debridement.  His caregiver is present during today's visit.  Chris Drown, MD is patient's PCP. Last visit was 03/23/2020.  No Known Allergies   Objective: There were no vitals filed for this visit.  [name] is a pleasant 80 y.o. Caucasian male morbidly obese in NAD. AAO X 3.  Vascular Examination: Capillary fill time to digits <3 seconds b/l lower extremities. Palpable pedal pulses b/l LE. Pedal hair absent. Lower extremity skin temperature gradient within normal limits. No edema noted b/l lower extremities.  Dermatological Examination: Pedal skin with normal turgor, texture and tone bilaterally. No open wounds. No interdigital macerations bilaterally. Toenails 1-5 b/l elongated, discolored, dystrophic, thickened, crumbly with subungual debris and tenderness to dorsal palpation.  Musculoskeletal Examination: Normal muscle strength 5/5 to all lower extremity muscle groups bilaterally. No pain crepitus or joint limitation noted with ROM b/l. No gross bony deformities bilaterally. Utilizes wheelchair for mobility assistance.  Neurological Examination: Protective sensation intact 5/5 intact bilaterally with 10g monofilament b/l. Proprioception intact bilaterally.  Assessment: 1. Pain due to onychomycosis of toenail    Plan: -Patient was evaluated and treated and all questions answered.  -Toenails 1-5 b/l debrided in length and girth without iatrogenic laceration. -Patient to continue soft, supportive shoe gear daily. -Patient/POA to call should there be question/concern in the interim.  Return in about 3 months (around 07/07/2020).  Marzetta Board, DPM

## 2020-04-13 NOTE — Telephone Encounter (Signed)
I am perfectly fine with going ahead with this referral Also should he have any increased confusional issues or other problems he would need to be seen here if nonemergent or ER if emergent and have a CAT scan

## 2020-04-13 NOTE — Telephone Encounter (Signed)
Daughter called wants a referral to [facility] for physical therapy and Ocupational therapy  He has falling two more times hit his head when he stood up he fell backwards his legs keeps given out on him.   Pt call back Angie [phone]

## 2020-04-13 NOTE — Telephone Encounter (Signed)
Daughter states he last fell 4 days ago and hit his head. Said he was disoriented for a few mins after fall and told her he felt like a truck ran over him but states he is ok now. See message below.

## 2020-04-14 DIAGNOSIS — K51318 Ulcerative (chronic) rectosigmoiditis with other complication: Secondary | ICD-10-CM | POA: Diagnosis not present

## 2020-04-14 DIAGNOSIS — M47812 Spondylosis without myelopathy or radiculopathy, cervical region: Secondary | ICD-10-CM | POA: Diagnosis not present

## 2020-04-17 DIAGNOSIS — M6281 Muscle weakness (generalized): Secondary | ICD-10-CM | POA: Diagnosis not present

## 2020-04-18 DIAGNOSIS — C44319 Basal cell carcinoma of skin of other parts of face: Secondary | ICD-10-CM | POA: Diagnosis not present

## 2020-04-18 DIAGNOSIS — L570 Actinic keratosis: Secondary | ICD-10-CM | POA: Diagnosis not present

## 2020-04-18 DIAGNOSIS — L820 Inflamed seborrheic keratosis: Secondary | ICD-10-CM | POA: Diagnosis not present

## 2020-04-18 DIAGNOSIS — D485 Neoplasm of uncertain behavior of skin: Secondary | ICD-10-CM | POA: Diagnosis not present

## 2020-04-19 DIAGNOSIS — J441 Chronic obstructive pulmonary disease with (acute) exacerbation: Secondary | ICD-10-CM | POA: Diagnosis not present

## 2020-04-19 DIAGNOSIS — J9601 Acute respiratory failure with hypoxia: Secondary | ICD-10-CM | POA: Diagnosis not present

## 2020-04-19 DIAGNOSIS — R0902 Hypoxemia: Secondary | ICD-10-CM | POA: Diagnosis not present

## 2020-04-25 ENCOUNTER — Other Ambulatory Visit: Payer: Self-pay | Admitting: Family Medicine

## 2020-04-25 DIAGNOSIS — R296 Repeated falls: Secondary | ICD-10-CM

## 2020-04-28 DIAGNOSIS — C44319 Basal cell carcinoma of skin of other parts of face: Secondary | ICD-10-CM | POA: Diagnosis not present

## 2020-05-04 DIAGNOSIS — K5130 Ulcerative (chronic) rectosigmoiditis without complications: Secondary | ICD-10-CM | POA: Diagnosis not present

## 2020-05-04 DIAGNOSIS — I5032 Chronic diastolic (congestive) heart failure: Secondary | ICD-10-CM | POA: Diagnosis not present

## 2020-05-04 DIAGNOSIS — J449 Chronic obstructive pulmonary disease, unspecified: Secondary | ICD-10-CM | POA: Diagnosis not present

## 2020-05-04 DIAGNOSIS — R2689 Other abnormalities of gait and mobility: Secondary | ICD-10-CM | POA: Diagnosis not present

## 2020-05-04 DIAGNOSIS — R531 Weakness: Secondary | ICD-10-CM | POA: Diagnosis not present

## 2020-05-04 DIAGNOSIS — R251 Tremor, unspecified: Secondary | ICD-10-CM | POA: Diagnosis not present

## 2020-05-04 DIAGNOSIS — M450 Ankylosing spondylitis of multiple sites in spine: Secondary | ICD-10-CM | POA: Diagnosis not present

## 2020-05-04 DIAGNOSIS — R296 Repeated falls: Secondary | ICD-10-CM | POA: Diagnosis not present

## 2020-05-09 DIAGNOSIS — I5032 Chronic diastolic (congestive) heart failure: Secondary | ICD-10-CM | POA: Diagnosis not present

## 2020-05-09 DIAGNOSIS — K5130 Ulcerative (chronic) rectosigmoiditis without complications: Secondary | ICD-10-CM | POA: Diagnosis not present

## 2020-05-09 DIAGNOSIS — R531 Weakness: Secondary | ICD-10-CM | POA: Diagnosis not present

## 2020-05-09 DIAGNOSIS — R251 Tremor, unspecified: Secondary | ICD-10-CM | POA: Diagnosis not present

## 2020-05-09 DIAGNOSIS — J449 Chronic obstructive pulmonary disease, unspecified: Secondary | ICD-10-CM | POA: Diagnosis not present

## 2020-05-09 DIAGNOSIS — R296 Repeated falls: Secondary | ICD-10-CM | POA: Diagnosis not present

## 2020-05-09 DIAGNOSIS — M450 Ankylosing spondylitis of multiple sites in spine: Secondary | ICD-10-CM | POA: Diagnosis not present

## 2020-05-09 DIAGNOSIS — R2689 Other abnormalities of gait and mobility: Secondary | ICD-10-CM | POA: Diagnosis not present

## 2020-05-10 DIAGNOSIS — M47812 Spondylosis without myelopathy or radiculopathy, cervical region: Secondary | ICD-10-CM | POA: Diagnosis not present

## 2020-05-10 DIAGNOSIS — Z981 Arthrodesis status: Secondary | ICD-10-CM | POA: Diagnosis not present

## 2020-05-11 DIAGNOSIS — J449 Chronic obstructive pulmonary disease, unspecified: Secondary | ICD-10-CM | POA: Diagnosis not present

## 2020-05-11 DIAGNOSIS — R2689 Other abnormalities of gait and mobility: Secondary | ICD-10-CM | POA: Diagnosis not present

## 2020-05-11 DIAGNOSIS — I5032 Chronic diastolic (congestive) heart failure: Secondary | ICD-10-CM | POA: Diagnosis not present

## 2020-05-11 DIAGNOSIS — R251 Tremor, unspecified: Secondary | ICD-10-CM | POA: Diagnosis not present

## 2020-05-11 DIAGNOSIS — K5130 Ulcerative (chronic) rectosigmoiditis without complications: Secondary | ICD-10-CM | POA: Diagnosis not present

## 2020-05-11 DIAGNOSIS — R296 Repeated falls: Secondary | ICD-10-CM | POA: Diagnosis not present

## 2020-05-11 DIAGNOSIS — G4733 Obstructive sleep apnea (adult) (pediatric): Secondary | ICD-10-CM | POA: Diagnosis not present

## 2020-05-11 DIAGNOSIS — R531 Weakness: Secondary | ICD-10-CM | POA: Diagnosis not present

## 2020-05-11 DIAGNOSIS — M450 Ankylosing spondylitis of multiple sites in spine: Secondary | ICD-10-CM | POA: Diagnosis not present

## 2020-05-12 DIAGNOSIS — I110 Hypertensive heart disease with heart failure: Secondary | ICD-10-CM | POA: Diagnosis not present

## 2020-05-12 DIAGNOSIS — R296 Repeated falls: Secondary | ICD-10-CM | POA: Diagnosis not present

## 2020-05-12 DIAGNOSIS — J449 Chronic obstructive pulmonary disease, unspecified: Secondary | ICD-10-CM | POA: Diagnosis not present

## 2020-05-12 DIAGNOSIS — M450 Ankylosing spondylitis of multiple sites in spine: Secondary | ICD-10-CM | POA: Diagnosis not present

## 2020-05-12 DIAGNOSIS — R251 Tremor, unspecified: Secondary | ICD-10-CM | POA: Diagnosis not present

## 2020-05-12 DIAGNOSIS — R531 Weakness: Secondary | ICD-10-CM | POA: Diagnosis not present

## 2020-05-12 DIAGNOSIS — K5130 Ulcerative (chronic) rectosigmoiditis without complications: Secondary | ICD-10-CM | POA: Diagnosis not present

## 2020-05-12 DIAGNOSIS — I5032 Chronic diastolic (congestive) heart failure: Secondary | ICD-10-CM | POA: Diagnosis not present

## 2020-05-12 DIAGNOSIS — R2689 Other abnormalities of gait and mobility: Secondary | ICD-10-CM | POA: Diagnosis not present

## 2020-05-16 DIAGNOSIS — K5130 Ulcerative (chronic) rectosigmoiditis without complications: Secondary | ICD-10-CM | POA: Diagnosis not present

## 2020-05-16 DIAGNOSIS — R251 Tremor, unspecified: Secondary | ICD-10-CM | POA: Diagnosis not present

## 2020-05-16 DIAGNOSIS — M450 Ankylosing spondylitis of multiple sites in spine: Secondary | ICD-10-CM | POA: Diagnosis not present

## 2020-05-16 DIAGNOSIS — R531 Weakness: Secondary | ICD-10-CM | POA: Diagnosis not present

## 2020-05-16 DIAGNOSIS — I5032 Chronic diastolic (congestive) heart failure: Secondary | ICD-10-CM | POA: Diagnosis not present

## 2020-05-16 DIAGNOSIS — R296 Repeated falls: Secondary | ICD-10-CM | POA: Diagnosis not present

## 2020-05-16 DIAGNOSIS — J449 Chronic obstructive pulmonary disease, unspecified: Secondary | ICD-10-CM | POA: Diagnosis not present

## 2020-05-16 DIAGNOSIS — R2689 Other abnormalities of gait and mobility: Secondary | ICD-10-CM | POA: Diagnosis not present

## 2020-05-18 DIAGNOSIS — I5032 Chronic diastolic (congestive) heart failure: Secondary | ICD-10-CM | POA: Diagnosis not present

## 2020-05-18 DIAGNOSIS — R296 Repeated falls: Secondary | ICD-10-CM | POA: Diagnosis not present

## 2020-05-18 DIAGNOSIS — R251 Tremor, unspecified: Secondary | ICD-10-CM | POA: Diagnosis not present

## 2020-05-18 DIAGNOSIS — R2689 Other abnormalities of gait and mobility: Secondary | ICD-10-CM | POA: Diagnosis not present

## 2020-05-18 DIAGNOSIS — M450 Ankylosing spondylitis of multiple sites in spine: Secondary | ICD-10-CM | POA: Diagnosis not present

## 2020-05-18 DIAGNOSIS — J449 Chronic obstructive pulmonary disease, unspecified: Secondary | ICD-10-CM | POA: Diagnosis not present

## 2020-05-18 DIAGNOSIS — K5130 Ulcerative (chronic) rectosigmoiditis without complications: Secondary | ICD-10-CM | POA: Diagnosis not present

## 2020-05-18 DIAGNOSIS — M6281 Muscle weakness (generalized): Secondary | ICD-10-CM | POA: Diagnosis not present

## 2020-05-18 DIAGNOSIS — R531 Weakness: Secondary | ICD-10-CM | POA: Diagnosis not present

## 2020-05-20 DIAGNOSIS — R531 Weakness: Secondary | ICD-10-CM | POA: Diagnosis not present

## 2020-05-20 DIAGNOSIS — R2689 Other abnormalities of gait and mobility: Secondary | ICD-10-CM | POA: Diagnosis not present

## 2020-05-20 DIAGNOSIS — J9601 Acute respiratory failure with hypoxia: Secondary | ICD-10-CM | POA: Diagnosis not present

## 2020-05-20 DIAGNOSIS — I5032 Chronic diastolic (congestive) heart failure: Secondary | ICD-10-CM | POA: Diagnosis not present

## 2020-05-20 DIAGNOSIS — R251 Tremor, unspecified: Secondary | ICD-10-CM | POA: Diagnosis not present

## 2020-05-20 DIAGNOSIS — R0902 Hypoxemia: Secondary | ICD-10-CM | POA: Diagnosis not present

## 2020-05-20 DIAGNOSIS — M450 Ankylosing spondylitis of multiple sites in spine: Secondary | ICD-10-CM | POA: Diagnosis not present

## 2020-05-20 DIAGNOSIS — J441 Chronic obstructive pulmonary disease with (acute) exacerbation: Secondary | ICD-10-CM | POA: Diagnosis not present

## 2020-05-20 DIAGNOSIS — J449 Chronic obstructive pulmonary disease, unspecified: Secondary | ICD-10-CM | POA: Diagnosis not present

## 2020-05-20 DIAGNOSIS — R296 Repeated falls: Secondary | ICD-10-CM | POA: Diagnosis not present

## 2020-05-20 DIAGNOSIS — K5130 Ulcerative (chronic) rectosigmoiditis without complications: Secondary | ICD-10-CM | POA: Diagnosis not present

## 2020-05-24 DIAGNOSIS — M450 Ankylosing spondylitis of multiple sites in spine: Secondary | ICD-10-CM | POA: Diagnosis not present

## 2020-05-24 DIAGNOSIS — K5130 Ulcerative (chronic) rectosigmoiditis without complications: Secondary | ICD-10-CM | POA: Diagnosis not present

## 2020-05-24 DIAGNOSIS — I5032 Chronic diastolic (congestive) heart failure: Secondary | ICD-10-CM | POA: Diagnosis not present

## 2020-05-24 DIAGNOSIS — R296 Repeated falls: Secondary | ICD-10-CM | POA: Diagnosis not present

## 2020-05-24 DIAGNOSIS — R2689 Other abnormalities of gait and mobility: Secondary | ICD-10-CM | POA: Diagnosis not present

## 2020-05-24 DIAGNOSIS — R531 Weakness: Secondary | ICD-10-CM | POA: Diagnosis not present

## 2020-05-24 DIAGNOSIS — R251 Tremor, unspecified: Secondary | ICD-10-CM | POA: Diagnosis not present

## 2020-05-24 DIAGNOSIS — J449 Chronic obstructive pulmonary disease, unspecified: Secondary | ICD-10-CM | POA: Diagnosis not present

## 2020-05-25 ENCOUNTER — Telehealth: Payer: Self-pay | Admitting: Family Medicine

## 2020-05-25 NOTE — Telephone Encounter (Signed)
Left message to return call 

## 2020-05-25 NOTE — Telephone Encounter (Signed)
Need more info In what context?  I focus more on healthy eating etc He is morbidly obese but he will not be likely to lose much weight due to age and disability

## 2020-05-25 NOTE — Telephone Encounter (Signed)
Sabra from [facility] contacted office to see if pt has any weight perimeters on patient so they can put on file. Please advise. Thank you

## 2020-05-26 DIAGNOSIS — R296 Repeated falls: Secondary | ICD-10-CM | POA: Diagnosis not present

## 2020-05-26 DIAGNOSIS — R2689 Other abnormalities of gait and mobility: Secondary | ICD-10-CM | POA: Diagnosis not present

## 2020-05-26 DIAGNOSIS — R251 Tremor, unspecified: Secondary | ICD-10-CM | POA: Diagnosis not present

## 2020-05-26 DIAGNOSIS — J449 Chronic obstructive pulmonary disease, unspecified: Secondary | ICD-10-CM | POA: Diagnosis not present

## 2020-05-26 DIAGNOSIS — R531 Weakness: Secondary | ICD-10-CM | POA: Diagnosis not present

## 2020-05-26 DIAGNOSIS — I5032 Chronic diastolic (congestive) heart failure: Secondary | ICD-10-CM | POA: Diagnosis not present

## 2020-05-26 DIAGNOSIS — M450 Ankylosing spondylitis of multiple sites in spine: Secondary | ICD-10-CM | POA: Diagnosis not present

## 2020-05-26 DIAGNOSIS — K5130 Ulcerative (chronic) rectosigmoiditis without complications: Secondary | ICD-10-CM | POA: Diagnosis not present

## 2020-05-27 DIAGNOSIS — J449 Chronic obstructive pulmonary disease, unspecified: Secondary | ICD-10-CM | POA: Diagnosis not present

## 2020-05-27 DIAGNOSIS — R531 Weakness: Secondary | ICD-10-CM | POA: Diagnosis not present

## 2020-05-27 DIAGNOSIS — R2689 Other abnormalities of gait and mobility: Secondary | ICD-10-CM | POA: Diagnosis not present

## 2020-05-27 DIAGNOSIS — R251 Tremor, unspecified: Secondary | ICD-10-CM | POA: Diagnosis not present

## 2020-05-27 DIAGNOSIS — K5130 Ulcerative (chronic) rectosigmoiditis without complications: Secondary | ICD-10-CM | POA: Diagnosis not present

## 2020-05-27 DIAGNOSIS — M450 Ankylosing spondylitis of multiple sites in spine: Secondary | ICD-10-CM | POA: Diagnosis not present

## 2020-05-27 DIAGNOSIS — I5032 Chronic diastolic (congestive) heart failure: Secondary | ICD-10-CM | POA: Diagnosis not present

## 2020-05-27 DIAGNOSIS — R296 Repeated falls: Secondary | ICD-10-CM | POA: Diagnosis not present

## 2020-05-30 DIAGNOSIS — R531 Weakness: Secondary | ICD-10-CM | POA: Diagnosis not present

## 2020-05-30 DIAGNOSIS — M450 Ankylosing spondylitis of multiple sites in spine: Secondary | ICD-10-CM | POA: Diagnosis not present

## 2020-05-30 DIAGNOSIS — K5130 Ulcerative (chronic) rectosigmoiditis without complications: Secondary | ICD-10-CM | POA: Diagnosis not present

## 2020-05-30 DIAGNOSIS — R296 Repeated falls: Secondary | ICD-10-CM | POA: Diagnosis not present

## 2020-05-30 DIAGNOSIS — R2689 Other abnormalities of gait and mobility: Secondary | ICD-10-CM | POA: Diagnosis not present

## 2020-05-30 DIAGNOSIS — R251 Tremor, unspecified: Secondary | ICD-10-CM | POA: Diagnosis not present

## 2020-05-30 DIAGNOSIS — I5032 Chronic diastolic (congestive) heart failure: Secondary | ICD-10-CM | POA: Diagnosis not present

## 2020-05-30 DIAGNOSIS — J449 Chronic obstructive pulmonary disease, unspecified: Secondary | ICD-10-CM | POA: Diagnosis not present

## 2020-06-02 ENCOUNTER — Telehealth: Payer: Self-pay

## 2020-06-02 DIAGNOSIS — R2689 Other abnormalities of gait and mobility: Secondary | ICD-10-CM | POA: Diagnosis not present

## 2020-06-02 DIAGNOSIS — R251 Tremor, unspecified: Secondary | ICD-10-CM | POA: Diagnosis not present

## 2020-06-02 DIAGNOSIS — K5130 Ulcerative (chronic) rectosigmoiditis without complications: Secondary | ICD-10-CM | POA: Diagnosis not present

## 2020-06-02 DIAGNOSIS — R296 Repeated falls: Secondary | ICD-10-CM | POA: Diagnosis not present

## 2020-06-02 DIAGNOSIS — M450 Ankylosing spondylitis of multiple sites in spine: Secondary | ICD-10-CM | POA: Diagnosis not present

## 2020-06-02 DIAGNOSIS — I5032 Chronic diastolic (congestive) heart failure: Secondary | ICD-10-CM | POA: Diagnosis not present

## 2020-06-02 DIAGNOSIS — J449 Chronic obstructive pulmonary disease, unspecified: Secondary | ICD-10-CM | POA: Diagnosis not present

## 2020-06-02 DIAGNOSIS — R531 Weakness: Secondary | ICD-10-CM | POA: Diagnosis not present

## 2020-06-02 NOTE — Telephone Encounter (Signed)
[place] Physical Therapist with [facility]   Weight today is 293 lost one pound  Need Verbal Ok with Home Health PT 2 week x3, 1 week x1 starting next week   Constance Haw [phone] ok to leave confidential voice mail

## 2020-06-02 NOTE — Telephone Encounter (Signed)
Please give verbal

## 2020-06-03 ENCOUNTER — Telehealth: Payer: Self-pay | Admitting: Family Medicine

## 2020-06-03 DIAGNOSIS — R531 Weakness: Secondary | ICD-10-CM | POA: Diagnosis not present

## 2020-06-03 DIAGNOSIS — R296 Repeated falls: Secondary | ICD-10-CM | POA: Diagnosis not present

## 2020-06-03 DIAGNOSIS — J449 Chronic obstructive pulmonary disease, unspecified: Secondary | ICD-10-CM | POA: Diagnosis not present

## 2020-06-03 DIAGNOSIS — K5130 Ulcerative (chronic) rectosigmoiditis without complications: Secondary | ICD-10-CM | POA: Diagnosis not present

## 2020-06-03 DIAGNOSIS — R2689 Other abnormalities of gait and mobility: Secondary | ICD-10-CM | POA: Diagnosis not present

## 2020-06-03 DIAGNOSIS — M450 Ankylosing spondylitis of multiple sites in spine: Secondary | ICD-10-CM | POA: Diagnosis not present

## 2020-06-03 DIAGNOSIS — R251 Tremor, unspecified: Secondary | ICD-10-CM | POA: Diagnosis not present

## 2020-06-03 DIAGNOSIS — I5032 Chronic diastolic (congestive) heart failure: Secondary | ICD-10-CM | POA: Diagnosis not present

## 2020-06-03 NOTE — Telephone Encounter (Signed)
Occupational therapist from [facility] calling to request verbal orders to extend current OT plan of care to 2 times a week for 2 weeks. It is ok to leave verbal orders on secure voicemail. Please advise. Thank you

## 2020-06-03 NOTE — Telephone Encounter (Signed)
Detailed voicemail giving verbal orders left on [employer]

## 2020-06-04 NOTE — Telephone Encounter (Signed)
May have appropriate orders please

## 2020-06-06 DIAGNOSIS — R2689 Other abnormalities of gait and mobility: Secondary | ICD-10-CM | POA: Diagnosis not present

## 2020-06-06 DIAGNOSIS — K5130 Ulcerative (chronic) rectosigmoiditis without complications: Secondary | ICD-10-CM | POA: Diagnosis not present

## 2020-06-06 DIAGNOSIS — J449 Chronic obstructive pulmonary disease, unspecified: Secondary | ICD-10-CM | POA: Diagnosis not present

## 2020-06-06 DIAGNOSIS — R531 Weakness: Secondary | ICD-10-CM | POA: Diagnosis not present

## 2020-06-06 DIAGNOSIS — I5032 Chronic diastolic (congestive) heart failure: Secondary | ICD-10-CM | POA: Diagnosis not present

## 2020-06-06 DIAGNOSIS — R296 Repeated falls: Secondary | ICD-10-CM | POA: Diagnosis not present

## 2020-06-06 DIAGNOSIS — R251 Tremor, unspecified: Secondary | ICD-10-CM | POA: Diagnosis not present

## 2020-06-06 DIAGNOSIS — M450 Ankylosing spondylitis of multiple sites in spine: Secondary | ICD-10-CM | POA: Diagnosis not present

## 2020-06-06 NOTE — Telephone Encounter (Signed)
Will (OT) contacted and verbalized understanding.

## 2020-06-07 DIAGNOSIS — R531 Weakness: Secondary | ICD-10-CM | POA: Diagnosis not present

## 2020-06-07 DIAGNOSIS — K5130 Ulcerative (chronic) rectosigmoiditis without complications: Secondary | ICD-10-CM | POA: Diagnosis not present

## 2020-06-07 DIAGNOSIS — R296 Repeated falls: Secondary | ICD-10-CM | POA: Diagnosis not present

## 2020-06-07 DIAGNOSIS — R251 Tremor, unspecified: Secondary | ICD-10-CM | POA: Diagnosis not present

## 2020-06-07 DIAGNOSIS — R2689 Other abnormalities of gait and mobility: Secondary | ICD-10-CM | POA: Diagnosis not present

## 2020-06-07 DIAGNOSIS — M450 Ankylosing spondylitis of multiple sites in spine: Secondary | ICD-10-CM | POA: Diagnosis not present

## 2020-06-07 DIAGNOSIS — J449 Chronic obstructive pulmonary disease, unspecified: Secondary | ICD-10-CM | POA: Diagnosis not present

## 2020-06-07 DIAGNOSIS — I5032 Chronic diastolic (congestive) heart failure: Secondary | ICD-10-CM | POA: Diagnosis not present

## 2020-06-08 ENCOUNTER — Telehealth: Payer: Self-pay | Admitting: Family Medicine

## 2020-06-08 DIAGNOSIS — I5032 Chronic diastolic (congestive) heart failure: Secondary | ICD-10-CM | POA: Diagnosis not present

## 2020-06-08 DIAGNOSIS — M450 Ankylosing spondylitis of multiple sites in spine: Secondary | ICD-10-CM | POA: Diagnosis not present

## 2020-06-08 DIAGNOSIS — K5130 Ulcerative (chronic) rectosigmoiditis without complications: Secondary | ICD-10-CM | POA: Diagnosis not present

## 2020-06-08 DIAGNOSIS — R296 Repeated falls: Secondary | ICD-10-CM | POA: Diagnosis not present

## 2020-06-08 DIAGNOSIS — R531 Weakness: Secondary | ICD-10-CM | POA: Diagnosis not present

## 2020-06-08 DIAGNOSIS — R2689 Other abnormalities of gait and mobility: Secondary | ICD-10-CM | POA: Diagnosis not present

## 2020-06-08 DIAGNOSIS — J449 Chronic obstructive pulmonary disease, unspecified: Secondary | ICD-10-CM | POA: Diagnosis not present

## 2020-06-08 DIAGNOSIS — R251 Tremor, unspecified: Secondary | ICD-10-CM | POA: Diagnosis not present

## 2020-06-08 NOTE — Telephone Encounter (Signed)
So some fluctuation is not unusual with the weight  Unfortunately this gentleman is fairly not active because of his health issues Does a home health person hear any fluid in the lungs?  Severe increase in swelling in the legs?

## 2020-06-08 NOTE — Telephone Encounter (Signed)
Spoke with [place] agency staff regarding swelling details, states patient lungs are clear and noticed some swelling in feet and ankles. Patient mentioned to home health staff there may be standing orders for weight gain from 3 to 5 lbs . Please advise

## 2020-06-08 NOTE — Telephone Encounter (Addendum)
Spoke with [name] at [facility] who states they have reached out to Cardiology who prescribes the medication to clarify the orders. Nothing further needed from [place] currently.

## 2020-06-08 NOTE — Telephone Encounter (Signed)
[name] from home health calling to report elevated vitals: BP 142/96 and weight is 299.2lb (one week ago pt weighed 295). Please advise. Thank you

## 2020-06-08 NOTE — Telephone Encounter (Signed)
If there are standing orders then I am not privy to that Unfortunately this is complicated Will need to find out from family how much Lasix they have in regards to milligrams have any if they get them per day Then we can give direction

## 2020-06-08 NOTE — Telephone Encounter (Signed)
Left message for Home Health provider to return call for details.

## 2020-06-09 DIAGNOSIS — R2689 Other abnormalities of gait and mobility: Secondary | ICD-10-CM | POA: Diagnosis not present

## 2020-06-09 DIAGNOSIS — R296 Repeated falls: Secondary | ICD-10-CM | POA: Diagnosis not present

## 2020-06-09 DIAGNOSIS — R251 Tremor, unspecified: Secondary | ICD-10-CM | POA: Diagnosis not present

## 2020-06-09 DIAGNOSIS — M450 Ankylosing spondylitis of multiple sites in spine: Secondary | ICD-10-CM | POA: Diagnosis not present

## 2020-06-09 DIAGNOSIS — I5032 Chronic diastolic (congestive) heart failure: Secondary | ICD-10-CM | POA: Diagnosis not present

## 2020-06-09 DIAGNOSIS — K5130 Ulcerative (chronic) rectosigmoiditis without complications: Secondary | ICD-10-CM | POA: Diagnosis not present

## 2020-06-09 DIAGNOSIS — J449 Chronic obstructive pulmonary disease, unspecified: Secondary | ICD-10-CM | POA: Diagnosis not present

## 2020-06-09 DIAGNOSIS — R531 Weakness: Secondary | ICD-10-CM | POA: Diagnosis not present

## 2020-06-13 ENCOUNTER — Other Ambulatory Visit: Payer: Self-pay

## 2020-06-13 ENCOUNTER — Ambulatory Visit
Admission: EM | Admit: 2020-06-13 | Discharge: 2020-06-13 | Disposition: A | Discharge status: Home / Self Care | Payer: Medicare Other | Attending: Emergency Medicine | Admitting: Emergency Medicine

## 2020-06-13 DIAGNOSIS — H7291 Unspecified perforation of tympanic membrane, right ear: Secondary | ICD-10-CM

## 2020-06-13 MED ORDER — AMOXICILLIN 875 MG PO TABS: 875.0000 mg | ORAL_TABLET | Freq: Two times a day (BID) | ORAL | 0 refills | Status: AC

## 2020-06-13 NOTE — ED Triage Notes (Signed)
Pt states using a q-tip to rt ear and it started bleeding. States Dr. Lucia Gaskins his ENT is in surgery and was sent him here states may need antibiotics.

## 2020-06-13 NOTE — ED Provider Notes (Signed)
[facility]    CSN: [id number] Arrival date & time: 06/13/20  1413      History   Chief Complaint Chief Complaint  Patient presents with  . Ear Injury    HPI [name] is a 80 y.o. male presenting today for evaluation of ear pain.  Patient was using a Q-tip in his right ear and it began to bleed.  Denies associated pain.  Sees ENT for chronic dry skin in ears.  HPI  Past Medical History:  Diagnosis Date  . Ankylosing spondylitis ([place]) 11/07/2018  . Asthma   . BPH (benign prostatic hyperplasia)   . CAD (coronary artery disease) 01/1995  . CHF (congestive heart failure) ([place])   . Clostridium difficile colitis 04/2005  . Colitis 2011  . COPD (chronic obstructive pulmonary disease) ([place])   . Depression   . Diverticulosis   . DJD (degenerative joint disease)   . Gastric ulcer 04/17/10   Three 76m gastric ulcers, H.pylori serologies were negative  . GERD (gastroesophageal reflux disease)   . History of kidney stones   . Hyperlipidemia   . Hypertension   . Idiopathic chronic inflammatory bowel disease 05/18/2010   left-sided UC  . Kidney stone   . Morbid obesity ([facility] 03/12/2018  . Obstructive sleep apnea    on Cpap  . Reflux 02/1995  . S/P endoscopy 07/24/10   retained gastric contents, benign bx    Patient Active Problem List   Diagnosis Date Noted  . DNR (do not resuscitate) 08/17/2019  . COPD with acute bronchitis ([facility] 08/17/2019  . COPD exacerbation ([facility] 08/17/2019  . SIRS (systemic inflammatory response syndrome) ([facility] 08/16/2019  . Pneumonia due to COVID-19 virus 04/09/2019  . Shortness of breath 04/09/2019  . COPD (chronic obstructive pulmonary disease) ([facility] 04/09/2019  . Chronic ulcerative proctosigmoiditis, without complications ([facility] 076/28/3151 . Chronic respiratory failure with hypoxia ([facility] 04/09/2019  . Obesity with body mass index (BMI) of 30.0 to 39.9 04/09/2019  . Acute on chronic respiratory failure with hypoxia and  hypercapnia ([facility] 12/23/2018  . COPD with respiratory failure, acute ([facility] 12/23/2018  . Respiratory failure ([facility] 12/22/2018  . Acute respiratory failure with hypoxia ([facility] 12/21/2018  . Ankylosing spondylitis ([facility] 11/07/2018  . Rectal bleeding 08/26/2018  . Chronic diastolic CHF (congestive heart failure) ([facility] 03/27/2018  . Exertional dyspnea 03/15/2018  . Dyspnea 03/15/2018  . Morbid obesity ([facility] 03/12/2018  . History of carpal tunnel surgery of left wrist with unlar head resection 01/17/17 01/24/2017  . Closed fracture of distal radius and ulna, left, with malunion, subsequent encounter   . Carpal tunnel syndrome of left wrist   . Fracture, [place], left, closed 11/29/2016  . Hx of skin cancer, basal cell 11/30/2015  . Erectile dysfunction 06/06/2015  . Iron deficiency 08/18/2014  . Fecal incontinence 02/10/2014  . Essential hypertension 01/09/2013  . Dyslipidemia, goal LDL below 70 01/09/2013  . Chest pain at rest 12/14/2012  . CAD S/P percutaneous coronary angioplasty 12/14/2012  . Hiatal hernia 12/14/2012  . GERD (gastroesophageal reflux disease) 12/14/2012  . Prediabetes 12/09/2012  . BPH (benign prostatic hyperplasia) 11/18/2012  . RUQ pain 09/04/2012  . Gallstones 05/09/2012  . COPD with acute exacerbation ([facility] 04/30/2012  . Obstructive sleep apnea 04/30/2012  . Ulcerative colitis ([facility] 04/29/2012  . Ulcerative colitis, left sided ([facility] 12/26/2011  . Diarrhea 10/18/2010    Past Surgical History:  Procedure Laterality Date  . ANORECTAL MANOMETRY  2016   [facility]: concern for possible fissure. Noted  pelvic floor dyssnyergy  . BIOPSY  07/29/2017   Procedure: BIOPSY;  Surgeon: Daneil Dolin, MD;  Location: [facility];  Service: Endoscopy;;  ascending and sigmoid colon  . CARDIAC CATHETERIZATION     with stent  . CARDIOVASCULAR STRESS TEST  07/21/2009   No scintigraphic evidence of inducible myocardial ischemia  . CARPAL TUNNEL RELEASE Left 01/17/2017    Procedure: LEFT CARPAL TUNNEL RELEASE;  Surgeon: Carole Civil, MD;  Location: [facility] ORS;  Service: Orthopedics;  Laterality: Left;  . CATARACT EXTRACTION, BILATERAL Bilateral   . CERVICAL SPINE SURGERY     C4-5  . COLONOSCOPY  04/2005   granularity and friability erosions from rectum to 40cm. Bx infection vs IBD. C. Diff positive at the time.   . COLONOSCOPY  05/2010   [facility]: left-sided UC, bx with no dysplasia, shallow diverticula  . COLONOSCOPY [name] 11/07/2012   DIY:MEBR-AXENM proctocolitis status post segmental biopsy/Sigmoid colon polyps removed as described above. Procedure compromisd by technical difficulties. bx: Inflammation limited to sigmoid and rectum on pathology.  . COLONOSCOPY  04/2014   Dr. Nyoka Cowden at [facility]: well localized proctocolitis limited to sigmoid  . COLONOSCOPY WITH PROPOFOL [name] 07/29/2017   diverticulosis in colon, three 4-6 mm polyps at splenic flexure and in cecum, one 10 mm polyp in rectum, abnormal rectum and sigmoid consistent with active UC  . CORONARY STENT PLACEMENT  01/1995  . ESOPHAGOGASTRODUODENOSCOPY  07/24/2010   [facility]:[id number] esophagus  . ESOPHAGOGASTRODUODENOSCOPY  04/2014   Dr[facility]oka [facility]den at [facility]: negative small bowel biopsies  . FOOT SURGERY Bilateral two  . KNEE SURGERY  two  . POLYPECTOMY  07/29/2017   Procedure: POLYPECTOMY;  Surgeon: Daneil Dolin, MD;  Location[facility];  Service: Endoscopy;;  splenic flexure, ascending colon polyp;rectal  . SHOULDER SURGERY  two  . TONSILLECTOMY    . TRANSTHORACIC ECHOCARDIOGRAM  03/23/2009   EF 60-65%, normal LV systolic function  . ULNAR HEAD EXCISION Left 01/17/2017   Procedure: LEFT ULNAR HEAD RESECTION;  Surgeon: Carole Civil, MD;  Location: [facility];  Service: Orthopedics;  Laterality: Left;       Home Medications    Prior to Admission medications   Medication Sig Start Date End Date Taking? Authorizing Provider  amoxicillin (AMOXIL) 875 MG tablet Take 1 tablet (875 mg total) by mouth 2  (two) times daily for 10 days. 06/13/20 06/23/20 Yes Happy Begeman C, PA-C  acetaminophen (TYLENOL) 325 MG tablet     [provider]  albuterol (PROVENTIL) (2.5 MG/3ML) 0.083% nebulizer solution Take 3 mLs (2.5 mg total) by nebulization every 6 (six) hours as needed for wheezing or shortness of breath. 08/18/19   Kathie Dike, MD  albuterol (VENTOLIN HFA) 108 (90 Base) MCG/ACT inhaler Inhale 2 puffs into the lungs every 6 (six) hours as needed for wheezing or shortness of breath. 12/07/19   [name], MD  aspirin (BAYER LOW DOSE) 81 MG EC tablet Take by mouth.    [provider]  budesonide-formoterol [facility]) 160-4.5 MCG/ACT inhaler     [provider]  celecoxib (CELEBREX) 100 MG capsule Take 100 mg by mouth daily. 04/05/20   [provider]  celecoxib (CELEBREX) 100 MG capsule Take 1 tablet by mouth daily. 03/29/20   [provider]  clonazePAM (KLONOPIN) 0.5 MG tablet     [provider]  clotrimazole (LOTRIMIN) 1 % cream     [provider]  ferrous sulfate 325 (65 FE) MG tablet Take 1 tablet  by mouth daily with breakfast. 08/18/14   [provider]  furosemide (LASIX) 40 MG tablet Take by mouth.    [provider]  guaiFENesin (MUCINEX) 600 MG 12 hr tablet     [provider]  loperamide (IMODIUM) 2 MG capsule     [provider]  mesalamine (LIALDA) 1.2 g EC tablet Take 4.8 g by mouth daily. 03/28/20   [provider]  metoprolol tartrate (LOPRESSOR) 50 MG tablet TAKE 1/2 TABLET BY MOUTH EVERY EVENING 12/07/19   [name], MD  Multiple Vitamin (MULTIVITAMIN WITH MINERALS) TABS tablet Take 1 tablet by mouth daily.    [provider]  mupirocin ointment (BACTROBAN) 2 % Apply BID to raw area 12/09/19   Luking, Elayne Snare, MD  nitroGLYCERIN (NITROSTAT) 0.4 MG SL tablet Place 1 tablet (0.4 mg total) under the tongue every 5 (five) minutes as needed for chest pain. 09/09/19    Lorretta Harp, MD  pantoprazole (PROTONIX) 40 MG tablet  01/12/20   [provider]  pravastatin (PRAVACHOL) 40 MG tablet Take 1 tablet (40 mg total) by mouth at bedtime. 12/07/19   [name], MD  sertraline (ZOLOFT) 100 MG tablet TAKE 2 TABLETS BY MOUTH DAILY 02/29/20   [name], MD  tiotropium (SPIRIVA HANDIHALER) 18 MCG inhalation capsule One inhalation po daily Patient taking differently: Place 18 mcg into inhaler and inhale daily as needed (shortness of breath). 11/06/18   [name], MD  traMADol (ULTRAM) 50 MG tablet Take 50 mg by mouth every 6 (six) hours as needed. 01/28/20   [provider]    Family History Family History  Problem Relation Age of Onset  . Lung cancer Mother   . Cancer Mother        breast  . Diabetes Mother   . Stroke Father   . Hypertension Father   . Kidney failure Brother   . Other Child        blood infection  . Colon cancer Neg Hx     Social History Social History   Tobacco Use  . Smoking status: Former Smoker    Packs/day: 1.00    Years: 20.00    Pack years: 20.00    Types: Cigarettes    Quit date: 09/30/1969    Years since quitting: 50.7  . Smokeless tobacco: Never Used  Vaping Use  . Vaping Use: Never used  Substance Use Topics  . Alcohol use: No  . Drug use: No     Allergies   Patient has no known allergies.   Review of Systems Review of Systems  Constitutional: Negative for activity change, appetite change, chills, fatigue and fever.  HENT: Positive for ear discharge. Negative for congestion, ear pain, rhinorrhea, sinus pressure, sore throat and trouble swallowing.   Eyes: Negative for discharge and redness.  Respiratory: Negative for cough, chest tightness and shortness of breath.   Cardiovascular: Negative for chest pain.  Gastrointestinal: Negative for abdominal pain, diarrhea, nausea and vomiting.  Musculoskeletal: Negative for myalgias.  Skin: Negative for rash.  Neurological:  Negative for dizziness, light-headedness and headaches.     Physical Exam Triage Vital Signs ED Triage Vitals  Enc Vitals Group     BP 06/13/20 1614 (!) 195/71     Pulse Rate 06/13/20 1614 81     Resp 06/13/20 1614 20     Temp 06/13/20 1614 97.6 F (36.4 C)     Temp Source 06/13/20 1614 Oral  SpO2 06/13/20 1614 91 %     Weight --      Height --      Head Circumference --      Peak Flow --      Pain Score 06/13/20 1615 0     Pain Loc --      Pain Edu? --      Excl. in [place]? --    No data found.  Updated Vital Signs BP (!) 195/71 (BP Location: Left Arm)   Pulse 81   Temp 97.6 F (36.4 C) (Oral)   Resp 20   SpO2 91%   Visual Acuity Right Eye Distance:   Left Eye Distance:   Bilateral Distance:    Right Eye Near:   Left Eye Near:    Bilateral Near:     Physical Exam Vitals and nursing note reviewed.  Constitutional:      Appearance: He is well-developed.     Comments: No acute distress  HENT:     Head: Normocephalic and atraumatic.     Ears:     Comments: Left canal a small area of bleeding, TM intact with effusion present  Right canal with large amount of bright red blood noted throughout canal, TM difficult to visualize, no bony landmarks noted    Nose: Nose normal.     Mouth/Throat:     Comments: Oral mucosa pink and moist, no tonsillar enlargement or exudate. Posterior pharynx patent and nonerythematous, no uvula deviation or swelling. Normal phonation. Eyes:     Conjunctiva/sclera: Conjunctivae normal.  Cardiovascular:     Rate and Rhythm: Normal rate.  Pulmonary:     Effort: Pulmonary effort is normal. No respiratory distress.  Abdominal:     General: There is no distension.  Musculoskeletal:        General: Normal range of motion.     Cervical back: Neck supple.  Skin:    General: Skin is warm and dry.  Neurological:     Mental Status: He is alert and oriented to person, place, and time.      UC Treatments / Results  Labs (all labs  ordered are listed, but only abnormal results are displayed) Labs Reviewed - No data to display  EKG   Radiology No results found.  Procedures Procedures (including critical care time)  Medications Ordered in UC Medications - No data to display  Initial Impression / Assessment and Plan / UC Course  I have reviewed the triage vital signs and the nursing notes.  Pertinent labs & imaging results that were available during my care of the patient were reviewed by me and considered in my medical decision making (see chart for details).     Given unable to visualize normal TM and amount of blood noted in canal suspicious of right TM rupture from trauma from Q-tip.  Placing on antibiotics empirically and recommending follow-up with ENT.  Discussed refraining from any topical drops on the side until cleared by ENT.  Discussed strict return precautions. Patient verbalized understanding and is agreeable with plan.  Final Clinical Impressions(s) / UC Diagnoses   Final diagnoses:  Tympanic membrane rupture, right     Discharge Instructions     Amoxicillin twice daily for 10 days Avoid any drops or any further solutions in this ear until cleared by ENT Follow-up with ENT as planned    ED Prescriptions    Medication Sig Dispense Auth. Provider   amoxicillin (AMOXIL) 875 MG tablet Take 1 tablet (875 mg  total) by mouth 2 (two) times daily for 10 days. 20 tablet Ad Guttman, Iraan C, PA-C     PDMP not reviewed this encounter.   Janith Lima, [place] 06/13/20 1657

## 2020-06-13 NOTE — Discharge Instructions (Signed)
Amoxicillin twice daily for 10 days Avoid any drops or any further solutions in this ear until cleared by ENT Follow-up with ENT as planned

## 2020-06-15 DIAGNOSIS — R2689 Other abnormalities of gait and mobility: Secondary | ICD-10-CM | POA: Diagnosis not present

## 2020-06-15 DIAGNOSIS — R251 Tremor, unspecified: Secondary | ICD-10-CM | POA: Diagnosis not present

## 2020-06-15 DIAGNOSIS — R296 Repeated falls: Secondary | ICD-10-CM | POA: Diagnosis not present

## 2020-06-15 DIAGNOSIS — K5130 Ulcerative (chronic) rectosigmoiditis without complications: Secondary | ICD-10-CM | POA: Diagnosis not present

## 2020-06-15 DIAGNOSIS — M450 Ankylosing spondylitis of multiple sites in spine: Secondary | ICD-10-CM | POA: Diagnosis not present

## 2020-06-15 DIAGNOSIS — R531 Weakness: Secondary | ICD-10-CM | POA: Diagnosis not present

## 2020-06-15 DIAGNOSIS — I5032 Chronic diastolic (congestive) heart failure: Secondary | ICD-10-CM | POA: Diagnosis not present

## 2020-06-15 DIAGNOSIS — J449 Chronic obstructive pulmonary disease, unspecified: Secondary | ICD-10-CM | POA: Diagnosis not present

## 2020-06-16 ENCOUNTER — Telehealth: Payer: Self-pay

## 2020-06-16 DIAGNOSIS — R251 Tremor, unspecified: Secondary | ICD-10-CM | POA: Diagnosis not present

## 2020-06-16 DIAGNOSIS — I5032 Chronic diastolic (congestive) heart failure: Secondary | ICD-10-CM | POA: Diagnosis not present

## 2020-06-16 DIAGNOSIS — M450 Ankylosing spondylitis of multiple sites in spine: Secondary | ICD-10-CM | POA: Diagnosis not present

## 2020-06-16 DIAGNOSIS — K5130 Ulcerative (chronic) rectosigmoiditis without complications: Secondary | ICD-10-CM | POA: Diagnosis not present

## 2020-06-16 DIAGNOSIS — R531 Weakness: Secondary | ICD-10-CM | POA: Diagnosis not present

## 2020-06-16 DIAGNOSIS — J449 Chronic obstructive pulmonary disease, unspecified: Secondary | ICD-10-CM | POA: Diagnosis not present

## 2020-06-16 DIAGNOSIS — R2689 Other abnormalities of gait and mobility: Secondary | ICD-10-CM | POA: Diagnosis not present

## 2020-06-16 DIAGNOSIS — R296 Repeated falls: Secondary | ICD-10-CM | POA: Diagnosis not present

## 2020-06-16 NOTE — Telephone Encounter (Signed)
Please advise. Thank you

## 2020-06-16 NOTE — Telephone Encounter (Signed)
Will with [facility] left a message on the main line to report a blood pressure reading of 171/73 and also he wants a verbal order to extend his therapy for 2 x a week for one week and then 1 x a week for one week.  Will [employer]  [phone]

## 2020-06-16 NOTE — Telephone Encounter (Signed)
Will contacted and verbalized understanding.

## 2020-06-16 NOTE — Telephone Encounter (Signed)
Go ahead with extending the verbal order Patient has follow-up office visit next week we can address blood pressure at that time unless having other issues

## 2020-06-19 DIAGNOSIS — J441 Chronic obstructive pulmonary disease with (acute) exacerbation: Secondary | ICD-10-CM | POA: Diagnosis not present

## 2020-06-19 DIAGNOSIS — R0902 Hypoxemia: Secondary | ICD-10-CM | POA: Diagnosis not present

## 2020-06-19 DIAGNOSIS — J9601 Acute respiratory failure with hypoxia: Secondary | ICD-10-CM | POA: Diagnosis not present

## 2020-06-20 ENCOUNTER — Ambulatory Visit: Payer: Medicare Other | Admitting: Family Medicine

## 2020-06-20 DIAGNOSIS — J449 Chronic obstructive pulmonary disease, unspecified: Secondary | ICD-10-CM | POA: Diagnosis not present

## 2020-06-20 DIAGNOSIS — K5130 Ulcerative (chronic) rectosigmoiditis without complications: Secondary | ICD-10-CM | POA: Diagnosis not present

## 2020-06-20 DIAGNOSIS — R251 Tremor, unspecified: Secondary | ICD-10-CM | POA: Diagnosis not present

## 2020-06-20 DIAGNOSIS — M450 Ankylosing spondylitis of multiple sites in spine: Secondary | ICD-10-CM | POA: Diagnosis not present

## 2020-06-20 DIAGNOSIS — R531 Weakness: Secondary | ICD-10-CM | POA: Diagnosis not present

## 2020-06-20 DIAGNOSIS — R296 Repeated falls: Secondary | ICD-10-CM | POA: Diagnosis not present

## 2020-06-20 DIAGNOSIS — R2689 Other abnormalities of gait and mobility: Secondary | ICD-10-CM | POA: Diagnosis not present

## 2020-06-20 DIAGNOSIS — I5032 Chronic diastolic (congestive) heart failure: Secondary | ICD-10-CM | POA: Diagnosis not present

## 2020-06-21 DIAGNOSIS — I5032 Chronic diastolic (congestive) heart failure: Secondary | ICD-10-CM | POA: Diagnosis not present

## 2020-06-21 DIAGNOSIS — R296 Repeated falls: Secondary | ICD-10-CM | POA: Diagnosis not present

## 2020-06-21 DIAGNOSIS — M450 Ankylosing spondylitis of multiple sites in spine: Secondary | ICD-10-CM | POA: Diagnosis not present

## 2020-06-21 DIAGNOSIS — K5130 Ulcerative (chronic) rectosigmoiditis without complications: Secondary | ICD-10-CM | POA: Diagnosis not present

## 2020-06-21 DIAGNOSIS — R531 Weakness: Secondary | ICD-10-CM | POA: Diagnosis not present

## 2020-06-21 DIAGNOSIS — R2689 Other abnormalities of gait and mobility: Secondary | ICD-10-CM | POA: Diagnosis not present

## 2020-06-21 DIAGNOSIS — R251 Tremor, unspecified: Secondary | ICD-10-CM | POA: Diagnosis not present

## 2020-06-21 DIAGNOSIS — J449 Chronic obstructive pulmonary disease, unspecified: Secondary | ICD-10-CM | POA: Diagnosis not present

## 2020-06-23 ENCOUNTER — Encounter: Payer: Self-pay | Admitting: Family Medicine

## 2020-06-23 ENCOUNTER — Telehealth: Payer: Self-pay | Admitting: Family Medicine

## 2020-06-23 ENCOUNTER — Ambulatory Visit (INDEPENDENT_AMBULATORY_CARE_PROVIDER_SITE_OTHER): Payer: Medicare Other | Admitting: Family Medicine

## 2020-06-23 ENCOUNTER — Other Ambulatory Visit: Payer: Self-pay

## 2020-06-23 VITALS — BP 153/73 | HR 73 | Temp 97.7°F | Ht 73.0 in | Wt 297.0 lb

## 2020-06-23 DIAGNOSIS — E559 Vitamin D deficiency, unspecified: Secondary | ICD-10-CM | POA: Diagnosis not present

## 2020-06-23 DIAGNOSIS — K5190 Ulcerative colitis, unspecified, without complications: Secondary | ICD-10-CM | POA: Diagnosis not present

## 2020-06-23 DIAGNOSIS — G4733 Obstructive sleep apnea (adult) (pediatric): Secondary | ICD-10-CM | POA: Diagnosis not present

## 2020-06-23 DIAGNOSIS — F325 Major depressive disorder, single episode, in full remission: Secondary | ICD-10-CM

## 2020-06-23 DIAGNOSIS — E038 Other specified hypothyroidism: Secondary | ICD-10-CM

## 2020-06-23 DIAGNOSIS — E611 Iron deficiency: Secondary | ICD-10-CM

## 2020-06-23 DIAGNOSIS — J9611 Chronic respiratory failure with hypoxia: Secondary | ICD-10-CM | POA: Diagnosis not present

## 2020-06-23 DIAGNOSIS — R7303 Prediabetes: Secondary | ICD-10-CM | POA: Diagnosis not present

## 2020-06-23 DIAGNOSIS — I10 Essential (primary) hypertension: Secondary | ICD-10-CM | POA: Diagnosis not present

## 2020-06-23 MED ORDER — SERTRALINE HCL 100 MG PO TABS: ORAL_TABLET | ORAL | 6 refills | Status: DC

## 2020-06-23 MED ORDER — FUROSEMIDE 40 MG PO TABS: ORAL_TABLET | ORAL | 6 refills | Status: DC

## 2020-06-23 MED ORDER — BUDESONIDE-FORMOTEROL FUMARATE 160-4.5 MCG/ACT IN AERO: 2.0000 | INHALATION_SPRAY | Freq: Two times a day (BID) | RESPIRATORY_TRACT | 6 refills | Status: DC

## 2020-06-23 NOTE — Telephone Encounter (Signed)
The amoxicillin should take care of any type of strep throat Warm salt water Warm water with 1 teaspoon of salt gargle and spit Certainly if getting worse notify us thank you

## 2020-06-23 NOTE — Telephone Encounter (Signed)
Patient was seen this morning and forgot to let you know that he had sore throat with blister on the back of his throat. Daughter states he is on amoxicillin currently .please advise

## 2020-06-23 NOTE — Telephone Encounter (Signed)
Discussed with pt's daughter [name] and she verbalized understanding.

## 2020-06-23 NOTE — Telephone Encounter (Signed)
Please advise. Thank you

## 2020-06-23 NOTE — Progress Notes (Signed)
Subjective:    Patient ID: [name], male    DOB: Jun 08, 1940, 80 y.o.   MRN: [id number]  HPI  Fluid retention and congestion Patient recently had respiratory illness with head congestion drainage coughing Not any wheezing currently Not using nebulizer treatments Stopped using his Symbicort but we have encouraged him to resume this Accidental R ear eardrum puncture that occurred after putting a Q-tip in his ear Has an appointment with ENT in June Moods are doing well denies being depressed currently   Discuss inhalation Tx  Review of Systems     Objective:   Physical Exam  Lungs are clear heart rate controlled abdomen soft extremities some edema      Assessment & Plan:  1. Essential hypertension Blood pressure decent control continue current measures - CBC with Differential/Platelet - Comprehensive metabolic panel - VITAMIN D 25 Hydroxy (Vit-D Deficiency, Fractures) - TSH + free T4 - Hemoglobin A1c - budesonide-formoterol (SYMBICORT) 160-4.5 MCG/ACT inhaler; Inhale 2 puffs into the lungs 2 (two) times daily.  Dispense: 3 each; Refill: 6 - furosemide (LASIX) 40 MG tablet; ONE TO ONE AND A HALF TABLET EACH MORNING AS NEEDED  Dispense: 45 tablet; Refill: 6  2. Obstructive sleep apnea I have encouraged him to use his machine he is not using it currently.  At his age he pretty much does what he wants to.  Very nice gentleman. - CBC with Differential/Platelet - Comprehensive metabolic panel - VITAMIN D 25 Hydroxy (Vit-D Deficiency, Fractures) - TSH + free T4 - Hemoglobin A1c - budesonide-formoterol (SYMBICORT) 160-4.5 MCG/ACT inhaler; Inhale 2 puffs into the lungs 2 (two) times daily.  Dispense: 3 each; Refill: 6 - furosemide (LASIX) 40 MG tablet; ONE TO ONE AND A HALF TABLET EACH MORNING AS NEEDED  Dispense: 45 tablet; Refill: 6  3. Ulcerative colitis without complications, unspecified location [facility] Under the care of gastroenterology. - CBC with  Differential/Platelet - Comprehensive metabolic panel - VITAMIN D 25 Hydroxy (Vit-D Deficiency, Fractures) - TSH + free T4 - Hemoglobin A1c - budesonide-formoterol (SYMBICORT) 160-4.5 MCG/ACT inhaler; Inhale 2 puffs into the lungs 2 (two) times daily.  Dispense: 3 each; Refill: 6 - furosemide (LASIX) 40 MG tablet; ONE TO ONE AND A HALF TABLET EACH MORNING AS NEEDED  Dispense: 45 tablet; Refill: 6  4. Iron deficiency May need iron supplementation check CBC - CBC with Differential/Platelet - Comprehensive metabolic panel - VITAMIN D 25 Hydroxy (Vit-D Deficiency, Fractures) - TSH + free T4 - Hemoglobin A1c - budesonide-formoterol (SYMBICORT) 160-4.5 MCG/ACT inhaler; Inhale 2 puffs into the lungs 2 (two) times daily.  Dispense: 3 each; Refill: 6 - furosemide (LASIX) 40 MG tablet; ONE TO ONE AND A HALF TABLET EACH MORNING AS NEEDED  Dispense: 45 tablet; Refill: 6  5. Subclinical hypothyroidism Check TSH continue current measures May need medication - CBC with Differential/Platelet - Comprehensive metabolic panel - VITAMIN D 25 Hydroxy (Vit-D Deficiency, Fractures) - TSH + free T4 - Hemoglobin A1c - budesonide-formoterol (SYMBICORT) 160-4.5 MCG/ACT inhaler; Inhale 2 puffs into the lungs 2 (two) times daily.  Dispense: 3 each; Refill: 6 - furosemide (LASIX) 40 MG tablet; ONE TO ONE AND A HALF TABLET EACH MORNING AS NEEDED  Dispense: 45 tablet; Refill: 6  6. Prediabetes Check A1c may need medication watch starches and by - CBC with Differential/Platelet - Comprehensive metabolic panel - VITAMIN D 25 Hydroxy (Vit-D Deficiency, Fractures) - TSH + free T4 - Hemoglobin A1c - budesonide-formoterol (SYMBICORT) 160-4.5 MCG/ACT inhaler; Inhale 2 puffs into  the lungs 2 (two) times daily.  Dispense: 3 each; Refill: 6 - furosemide (LASIX) 40 MG tablet; ONE TO ONE AND A HALF TABLET EACH MORNING AS NEEDED  Dispense: 45 tablet; Refill: 6  7. Chronic respiratory failure with hypoxia ([name]) Uses oxygen  intermittently when it does drop recently oxygen has been in the low 90s and stable  8. Depression, major, single episode, complete remission ([place]) Denies being depressed currently but taking sertraline seems to be in good remission currently monitor continue sertraline for now  It is a patient's advantage to follow-up in 3 months

## 2020-06-24 LAB — CBC WITH DIFFERENTIAL/PLATELET
Basophils Absolute: 0.1 10*3/uL (ref 0.0–0.2)
Basos: 1 %
EOS (ABSOLUTE): 0.7 10*3/uL — ABNORMAL HIGH (ref 0.0–0.4)
Eos: 7 %
Hematocrit: 40.1 % (ref 37.5–51.0)
Hemoglobin: 13.4 g/dL (ref 13.0–17.7)
Immature Grans (Abs): 0 10*3/uL (ref 0.0–0.1)
Immature Granulocytes: 0 %
Lymphocytes Absolute: 3.3 10*3/uL — ABNORMAL HIGH (ref 0.7–3.1)
Lymphs: 31 %
MCH: 28.7 pg (ref 26.6–33.0)
MCHC: 33.4 g/dL (ref 31.5–35.7)
MCV: 86 fL (ref 79–97)
Monocytes Absolute: 1 10*3/uL — ABNORMAL HIGH (ref 0.1–0.9)
Monocytes: 10 %
Neutrophils Absolute: 5.6 10*3/uL (ref 1.4–7.0)
Neutrophils: 51 %
Platelets: 165 10*3/uL (ref 150–450)
RBC: 4.67 x10E6/uL (ref 4.14–5.80)
RDW: 14 % (ref 11.6–15.4)
WBC: 10.8 10*3/uL (ref 3.4–10.8)

## 2020-06-24 LAB — COMPREHENSIVE METABOLIC PANEL
ALT: 14 IU/L (ref 0–44)
AST: 21 IU/L (ref 0–40)
Albumin/Globulin Ratio: 1.4 (ref 1.2–2.2)
Albumin: 3.8 g/dL (ref 3.6–4.6)
Alkaline Phosphatase: 84 IU/L (ref 44–121)
BUN/Creatinine Ratio: 15 (ref 10–24)
BUN: 19 mg/dL (ref 8–27)
Bilirubin Total: 0.4 mg/dL (ref 0.0–1.2)
CO2: 21 mmol/L (ref 20–29)
Calcium: 9.8 mg/dL (ref 8.6–10.2)
Chloride: 105 mmol/L (ref 96–106)
Creatinine, Ser: 1.25 mg/dL (ref 0.76–1.27)
Globulin, Total: 2.8 g/dL (ref 1.5–4.5)
Glucose: 117 mg/dL — ABNORMAL HIGH (ref 65–99)
Potassium: 4.1 mmol/L (ref 3.5–5.2)
Sodium: 146 mmol/L — ABNORMAL HIGH (ref 134–144)
Total Protein: 6.6 g/dL (ref 6.0–8.5)
eGFR: 58 mL/min/{1.73_m2} — ABNORMAL LOW (ref >59)

## 2020-06-24 LAB — TSH+FREE T4
Free T4: 1.13 ng/dL (ref 0.82–1.77)
TSH: 5.74 u[IU]/mL — ABNORMAL HIGH (ref 0.450–4.500)

## 2020-06-24 LAB — HEMOGLOBIN A1C
Est. average glucose Bld gHb Est-mCnc: 128 mg/dL
Hgb A1c MFr Bld: 6.1 % — ABNORMAL HIGH (ref 4.8–5.6)

## 2020-06-24 LAB — VITAMIN D 25 HYDROXY (VIT D DEFICIENCY, FRACTURES): Vit D, 25-Hydroxy: 23.5 ng/mL — ABNORMAL LOW (ref 30.0–100.0)

## 2020-06-26 ENCOUNTER — Other Ambulatory Visit: Payer: Self-pay | Admitting: Family Medicine

## 2020-06-27 ENCOUNTER — Telehealth: Payer: Self-pay | Admitting: Family Medicine

## 2020-06-27 MED ORDER — VITAMIN D (ERGOCALCIFEROL) 1.25 MG (50000 UNIT) PO CAPS: 50000.0000 [IU] | ORAL_CAPSULE | ORAL | 0 refills | Status: DC

## 2020-06-27 NOTE — Telephone Encounter (Signed)
Prescription sent electronically to pharmacy. 

## 2020-06-27 NOTE — Addendum Note (Signed)
Addended by: Dairl Ponder on: 06/27/2020 10:41 AM   Modules accepted: Orders

## 2020-06-27 NOTE — Addendum Note (Signed)
Addended by: Dairl Ponder on: 06/27/2020 04:39 PM   Modules accepted: Orders

## 2020-06-27 NOTE — Telephone Encounter (Signed)
May have 12 of these 1/week for the next 12 weeks

## 2020-06-27 NOTE — Telephone Encounter (Signed)
Pharmacy requesting 90 day supply on Vitamin D2 50000 IU. Take one capsule po q 7 days. Please advise. Thank you

## 2020-06-28 DIAGNOSIS — R296 Repeated falls: Secondary | ICD-10-CM | POA: Diagnosis not present

## 2020-06-28 DIAGNOSIS — R251 Tremor, unspecified: Secondary | ICD-10-CM | POA: Diagnosis not present

## 2020-06-28 DIAGNOSIS — R2689 Other abnormalities of gait and mobility: Secondary | ICD-10-CM | POA: Diagnosis not present

## 2020-06-28 DIAGNOSIS — I5032 Chronic diastolic (congestive) heart failure: Secondary | ICD-10-CM | POA: Diagnosis not present

## 2020-06-28 DIAGNOSIS — R531 Weakness: Secondary | ICD-10-CM | POA: Diagnosis not present

## 2020-06-28 DIAGNOSIS — M450 Ankylosing spondylitis of multiple sites in spine: Secondary | ICD-10-CM | POA: Diagnosis not present

## 2020-06-28 DIAGNOSIS — K5130 Ulcerative (chronic) rectosigmoiditis without complications: Secondary | ICD-10-CM | POA: Diagnosis not present

## 2020-06-28 DIAGNOSIS — J449 Chronic obstructive pulmonary disease, unspecified: Secondary | ICD-10-CM | POA: Diagnosis not present

## 2020-06-29 ENCOUNTER — Telehealth: Payer: Self-pay

## 2020-06-29 NOTE — Telephone Encounter (Signed)
[name] with [facility] left a message on voicemail letting us know that the patients BP was 204/85 and to call [name] back if we want to do anything different.  Chris [phone]

## 2020-06-29 NOTE — Telephone Encounter (Signed)
Left message to return call 

## 2020-06-29 NOTE — Telephone Encounter (Signed)
May recheck periodically when the patient was recently in the office blood pressure actually looked good Call back if any problems

## 2020-07-01 DIAGNOSIS — R251 Tremor, unspecified: Secondary | ICD-10-CM | POA: Diagnosis not present

## 2020-07-01 DIAGNOSIS — J449 Chronic obstructive pulmonary disease, unspecified: Secondary | ICD-10-CM | POA: Diagnosis not present

## 2020-07-01 DIAGNOSIS — R296 Repeated falls: Secondary | ICD-10-CM | POA: Diagnosis not present

## 2020-07-01 DIAGNOSIS — R531 Weakness: Secondary | ICD-10-CM | POA: Diagnosis not present

## 2020-07-01 DIAGNOSIS — K5130 Ulcerative (chronic) rectosigmoiditis without complications: Secondary | ICD-10-CM | POA: Diagnosis not present

## 2020-07-01 DIAGNOSIS — R2689 Other abnormalities of gait and mobility: Secondary | ICD-10-CM | POA: Diagnosis not present

## 2020-07-01 DIAGNOSIS — M450 Ankylosing spondylitis of multiple sites in spine: Secondary | ICD-10-CM | POA: Diagnosis not present

## 2020-07-01 DIAGNOSIS — I5032 Chronic diastolic (congestive) heart failure: Secondary | ICD-10-CM | POA: Diagnosis not present

## 2020-07-01 NOTE — Telephone Encounter (Signed)
Discussed with Will and he verbalized understanding.

## 2020-07-08 DIAGNOSIS — R296 Repeated falls: Secondary | ICD-10-CM | POA: Diagnosis not present

## 2020-07-08 DIAGNOSIS — K5130 Ulcerative (chronic) rectosigmoiditis without complications: Secondary | ICD-10-CM | POA: Diagnosis not present

## 2020-07-08 DIAGNOSIS — R531 Weakness: Secondary | ICD-10-CM | POA: Diagnosis not present

## 2020-07-08 DIAGNOSIS — J449 Chronic obstructive pulmonary disease, unspecified: Secondary | ICD-10-CM | POA: Diagnosis not present

## 2020-07-08 DIAGNOSIS — R2689 Other abnormalities of gait and mobility: Secondary | ICD-10-CM | POA: Diagnosis not present

## 2020-07-08 DIAGNOSIS — I5032 Chronic diastolic (congestive) heart failure: Secondary | ICD-10-CM | POA: Diagnosis not present

## 2020-07-08 DIAGNOSIS — M450 Ankylosing spondylitis of multiple sites in spine: Secondary | ICD-10-CM | POA: Diagnosis not present

## 2020-07-08 DIAGNOSIS — R251 Tremor, unspecified: Secondary | ICD-10-CM | POA: Diagnosis not present

## 2020-07-12 ENCOUNTER — Other Ambulatory Visit: Payer: Self-pay

## 2020-07-12 ENCOUNTER — Ambulatory Visit (INDEPENDENT_AMBULATORY_CARE_PROVIDER_SITE_OTHER): Payer: Medicare Other | Admitting: Otolaryngology

## 2020-07-12 ENCOUNTER — Telehealth: Payer: Self-pay

## 2020-07-12 VITALS — Temp 97.5°F

## 2020-07-12 DIAGNOSIS — I5032 Chronic diastolic (congestive) heart failure: Secondary | ICD-10-CM | POA: Diagnosis not present

## 2020-07-12 DIAGNOSIS — J449 Chronic obstructive pulmonary disease, unspecified: Secondary | ICD-10-CM | POA: Diagnosis not present

## 2020-07-12 DIAGNOSIS — J441 Chronic obstructive pulmonary disease with (acute) exacerbation: Secondary | ICD-10-CM

## 2020-07-12 DIAGNOSIS — K5130 Ulcerative (chronic) rectosigmoiditis without complications: Secondary | ICD-10-CM | POA: Diagnosis not present

## 2020-07-12 DIAGNOSIS — H6123 Impacted cerumen, bilateral: Secondary | ICD-10-CM | POA: Diagnosis not present

## 2020-07-12 DIAGNOSIS — M450 Ankylosing spondylitis of multiple sites in spine: Secondary | ICD-10-CM | POA: Diagnosis not present

## 2020-07-12 DIAGNOSIS — R2689 Other abnormalities of gait and mobility: Secondary | ICD-10-CM | POA: Diagnosis not present

## 2020-07-12 DIAGNOSIS — R296 Repeated falls: Secondary | ICD-10-CM | POA: Diagnosis not present

## 2020-07-12 DIAGNOSIS — R531 Weakness: Secondary | ICD-10-CM | POA: Diagnosis not present

## 2020-07-12 DIAGNOSIS — J9611 Chronic respiratory failure with hypoxia: Secondary | ICD-10-CM

## 2020-07-12 DIAGNOSIS — R251 Tremor, unspecified: Secondary | ICD-10-CM | POA: Diagnosis not present

## 2020-07-12 MED ORDER — ALBUTEROL SULFATE HFA 108 (90 BASE) MCG/ACT IN AERS: 2.0000 | INHALATION_SPRAY | Freq: Four times a day (QID) | RESPIRATORY_TRACT | 2 refills | Status: DC | PRN

## 2020-07-12 NOTE — Telephone Encounter (Signed)
Pt needs refill on albuterol (VENTOLIN HFA) 108 (90 Base) MCG/ACT inhaler [[id number]]  [id number] [place], [place] - [address] AT [address]   Pt call back [phone]

## 2020-07-12 NOTE — Progress Notes (Signed)
HPI: [name] is a 80 y.o. male who presents for evaluation of bleeding from his right ear after use of a Q-tip.  He is on baby aspirin.  Of note he has had previous fusion of his neck and is unable to move his neck much at all..  Past Medical History:  Diagnosis Date  . Ankylosing spondylitis ([place]) 11/07/2018  . Asthma   . BPH (benign prostatic hyperplasia)   . CAD (coronary artery disease) 01/1995  . CHF (congestive heart failure) ([place])   . Clostridium difficile colitis 04/2005  . Colitis 2011  . COPD (chronic obstructive pulmonary disease) ([place])   . Depression   . Diverticulosis   . DJD (degenerative joint disease)   . Gastric ulcer 04/17/10   Three 35m gastric ulcers, H.pylori serologies were negative  . GERD (gastroesophageal reflux disease)   . History of kidney stones   . Hyperlipidemia   . Hypertension   . Idiopathic chronic inflammatory bowel disease 05/18/2010   left-sided UC  . Kidney stone   . Morbid obesity ([place] 03/12/2018  . Obstructive sleep apnea    on Cpap  . Reflux 02/1995  . S/P endoscopy 07/24/10   retained gastric contents, benign bx   Past Surgical History:  Procedure Laterality Date  . ANORECTAL MANOMETRY  2016   [facility]: concern for possible fissure. Noted pelvic floor dyssnyergy  . BIOPSY  07/29/2017   Procedure: BIOPSY;  Surgeon: RDaneil Dolin MD;  Location: [facility];  Service: Endoscopy;;  ascending and sigmoid colon  . CARDIAC CATHETERIZATION     with stent  . CARDIOVASCULAR STRESS TEST  07/21/2009   No scintigraphic evidence of inducible myocardial ischemia  . CARPAL TUNNEL RELEASE Left 01/17/2017   Procedure: LEFT CARPAL TUNNEL RELEASE;  Surgeon: HCarole Civil MD;  Location: [facility] ORS;  Service: Orthopedics;  Laterality: Left;  . CATARACT EXTRACTION, BILATERAL Bilateral   . CERVICAL SPINE SURGERY     C4-5  . COLONOSCOPY  04/2005   granularity and friability erosions from rectum to 40cm. Bx infection vs IBD. C. Diff positive at  the time.   . COLONOSCOPY  05/2010   [facility]: left-sided UC, bx with no dysplasia, shallow diverticula  . COLONOSCOPY [name] 11/07/2012   REBR:[id number]-MMHWKproctocolitis status post segmental biopsy/Sigmoid colon polyps removed as described above. Procedure compromisd by technical difficulties. bx: Inflammation limited to sigmoid and rectum on pathology.  . COLONOSCOPY  04/2014   [facility] well localized proctocolitis limited to sigmoid  . COLONOSCOPY WITH PROPOFOL [name] 07/29/2017   diverticulosis in colon, three 4-6 mm polyps at splenic flexure and in cecum, one 10 mm polyp in rectum, abnormal rectum and sigmoid consistent with active UC  . CORONARY STENT PLACEMENT  01/1995  . ESOPHAGOGASTRODUODENOSCOPY  07/24/2010   RGSU:PJSRPResophagus  . ESOPHAGOGASTRODUODENOSCOPY  04/2014   [facility]  . FOOT SURGERY Bilateral two  . KNEE SURGERY  two  . POLYPECTOMY  07/29/2017   Procedure: POLYPECTOMY;  Surgeon: RDaneil Dolin MD;  Location: [facility];  Service: Endoscopy;;  splenic flexure, ascending colon polyp;rectal  . SHOULDER SURGERY  two  . TONSILLECTOMY    . TRANSTHORACIC ECHOCARDIOGRAM  03/23/2009   EF 60-65%, normal LV systolic function  . ULNAR HEAD EXCISION Left 01/17/2017   Procedure: LEFT ULNAR HEAD RESECTION;  Surgeon: HCarole Civil MD;  Location: [facility] ORS;  Service: Orthopedics;  Laterality: Left;   Social History   Socioeconomic  History  . Marital status: Widowed    Spouse name: Not on file  . Number of children: Not on file  . Years of education: Not on file  . Highest education level: Not on file  Occupational History  . Occupation: maintenance    Comment: retired  Tobacco Use  . Smoking status: Former Smoker    Packs/day: 1.00    Years: 20.00    Pack years: 20.00    Types: Cigarettes    Quit date: 09/30/1969    Years since quitting: 50.8  . Smokeless tobacco: Never Used  Vaping Use  . Vaping Use: Never used   Substance and Sexual Activity  . Alcohol use: No  . Drug use: No  . Sexual activity: Yes    Partners: Female    Birth control/protection: None    Comment: spouse  Other Topics Concern  . Not on file  Social History Narrative  . Not on file   Social Determinants of Health   Financial Resource Strain: Not on file  Food Insecurity: Not on file  Transportation Needs: Not on file  Physical Activity: Not on file  Stress: Not on file  Social Connections: Not on file   Family History  Problem Relation Age of Onset  . Lung cancer Mother   . Cancer Mother        breast  . Diabetes Mother   . Stroke Father   . Hypertension Father   . Kidney failure Brother   . Other Child        blood infection  . Colon cancer Neg Hx    No Known Allergies Prior to Admission medications   Medication Sig Start Date End Date Taking? Authorizing Provider  acetaminophen (TYLENOL) 325 MG tablet     [provider]  albuterol (PROVENTIL) (2.5 MG/3ML) 0.083% nebulizer solution Take 3 mLs (2.5 mg total) by nebulization every 6 (six) hours as needed for wheezing or shortness of breath. 08/18/19   Kathie Dike, MD  albuterol (VENTOLIN HFA) 108 (90 Base) MCG/ACT inhaler Inhale 2 puffs into the lungs every 6 (six) hours as needed for wheezing or shortness of breath. 07/12/20   [name], MD  aspirin (BAYER LOW DOSE) 81 MG EC tablet Take by mouth.    [provider]  budesonide-formoterol (SYMBICORT) 160-4.5 MCG/ACT inhaler Inhale 2 puffs into the lungs 2 (two) times daily. 06/23/20   [name], MD  celecoxib (CELEBREX) 100 MG capsule Take 100 mg by mouth daily. 04/05/20   [provider]  celecoxib (CELEBREX) 100 MG capsule Take 1 tablet by mouth daily. 03/29/20   [provider]  clonazePAM (KLONOPIN) 0.5 MG tablet     [provider]  clotrimazole (LOTRIMIN) 1 % cream     [provider]  ferrous sulfate 325 (65 FE) MG tablet Take 1 tablet by  mouth daily with breakfast. 08/18/14   [provider]  furosemide (LASIX) 40 MG tablet ONE TO ONE AND A HALF TABLET EACH MORNING AS NEEDED 06/23/20   [name], MD  guaiFENesin (MUCINEX) 600 MG 12 hr tablet     [provider]  loperamide (IMODIUM) 2 MG capsule     [provider]  mesalamine (LIALDA) 1.2 g EC tablet Take 4.8 g by mouth daily. 03/28/20   [provider]  metoprolol tartrate (LOPRESSOR) 50 MG tablet TAKE 1/2 TABLET BY MOUTH EVERY EVENING 06/27/20   [name], MD  Multiple Vitamin (MULTIVITAMIN WITH MINERALS)  TABS tablet Take 1 tablet by mouth daily.    [provider]  mupirocin ointment (BACTROBAN) 2 % Apply BID to raw area 12/09/19   Luking, Elayne Snare, MD  nitroGLYCERIN (NITROSTAT) 0.4 MG SL tablet Place 1 tablet (0.4 mg total) under the tongue every 5 (five) minutes as needed for chest pain. 09/09/19   Lorretta Harp, MD  pantoprazole (PROTONIX) 40 MG tablet  01/12/20   [provider]  pravastatin (PRAVACHOL) 40 MG tablet Take 1 tablet (40 mg total) by mouth at bedtime. 12/07/19   [name], MD  sertraline (ZOLOFT) 100 MG tablet TAKE 2 TABLETS BY MOUTH DAILY 06/23/20   [name], MD  traMADol (ULTRAM) 50 MG tablet Take 50 mg by mouth every 6 (six) hours as needed. 01/28/20   [provider]  Vitamin D, Ergocalciferol, (DRISDOL) 1.25 MG (50000 UNIT) CAPS capsule Take 1 capsule (50,000 Units total) by mouth every 7 (seven) days. 06/27/20   [name], MD     Positive ROS: Otherwise negative  All other systems have been reviewed and were otherwise negative with the exception of those mentioned in the HPI and as above.  Physical Exam: Constitutional: Alert, well-appearing, no acute distress Ears: External ears without lesions or tenderness. Ear canals with blood and some wax down in the right ear canal that was removed with suction and curettes as well as forceps.  He has some blood that was  partially on the TM but this cannot be removed adequately because of the stiff neck but the TM was clear anteriorly.  Left ear canal had minimal cerumen buildup that was removed with a curette.  The left TM was clear.. Nasal: External nose without lesions. Clear nasal passages Oral: Oropharynx clear. Neck: No palpable adenopathy or masses Respiratory: Breathing comfortably  Skin: No facial/neck lesions or rash noted.  Cerumen impaction removal  Date/Time: 07/12/2020 4:35 PM Performed by: Rozetta Nunnery, MD Authorized by: Rozetta Nunnery, MD   Consent:    Consent obtained:  Verbal   Consent given by:  Patient   Risks discussed:  Pain and bleeding Procedure details:    Location:  L ear and R ear   Procedure type: curette, suction and forceps   Post-procedure details:    Inspection:  TM intact and canal normal   Hearing quality:  Improved   Patient tolerance of procedure:  Tolerated well, no immediate complications Comments:     TMs are clear bilaterally although he has a little bit of blood just to the right TM that cannot be removed.    Assessment: Wax buildup in both ears with blood in the right ear canal following use of Q-tip.  Plan: Cautioned him about not using Q-tips in the ears.  Would recommend use of hydroperoxide and/or Debrox to clean the ears.  He will follow-up as needed.  Radene Journey, MD

## 2020-07-14 DIAGNOSIS — I5032 Chronic diastolic (congestive) heart failure: Secondary | ICD-10-CM | POA: Diagnosis not present

## 2020-07-14 DIAGNOSIS — R251 Tremor, unspecified: Secondary | ICD-10-CM | POA: Diagnosis not present

## 2020-07-14 DIAGNOSIS — R531 Weakness: Secondary | ICD-10-CM | POA: Diagnosis not present

## 2020-07-14 DIAGNOSIS — R296 Repeated falls: Secondary | ICD-10-CM | POA: Diagnosis not present

## 2020-07-14 DIAGNOSIS — K5130 Ulcerative (chronic) rectosigmoiditis without complications: Secondary | ICD-10-CM | POA: Diagnosis not present

## 2020-07-14 DIAGNOSIS — R2689 Other abnormalities of gait and mobility: Secondary | ICD-10-CM | POA: Diagnosis not present

## 2020-07-14 DIAGNOSIS — J449 Chronic obstructive pulmonary disease, unspecified: Secondary | ICD-10-CM | POA: Diagnosis not present

## 2020-07-14 DIAGNOSIS — M450 Ankylosing spondylitis of multiple sites in spine: Secondary | ICD-10-CM | POA: Diagnosis not present

## 2020-07-15 NOTE — Telephone Encounter (Signed)
Refill sent in earlier this week; pt daughter has picked it up

## 2020-07-15 NOTE — Telephone Encounter (Signed)
Re-send

## 2020-07-19 ENCOUNTER — Other Ambulatory Visit: Payer: Self-pay

## 2020-07-19 ENCOUNTER — Encounter: Payer: Self-pay | Admitting: Podiatry

## 2020-07-19 ENCOUNTER — Ambulatory Visit: Payer: Medicare Other | Admitting: Podiatry

## 2020-07-19 DIAGNOSIS — Z981 Arthrodesis status: Secondary | ICD-10-CM

## 2020-07-19 DIAGNOSIS — J449 Chronic obstructive pulmonary disease, unspecified: Secondary | ICD-10-CM | POA: Diagnosis not present

## 2020-07-19 DIAGNOSIS — M79676 Pain in unspecified toe(s): Secondary | ICD-10-CM | POA: Diagnosis not present

## 2020-07-19 DIAGNOSIS — K5130 Ulcerative (chronic) rectosigmoiditis without complications: Secondary | ICD-10-CM | POA: Diagnosis not present

## 2020-07-19 DIAGNOSIS — R1319 Other dysphagia: Secondary | ICD-10-CM | POA: Insufficient documentation

## 2020-07-19 DIAGNOSIS — R531 Weakness: Secondary | ICD-10-CM | POA: Diagnosis not present

## 2020-07-19 DIAGNOSIS — R251 Tremor, unspecified: Secondary | ICD-10-CM | POA: Diagnosis not present

## 2020-07-19 DIAGNOSIS — K219 Gastro-esophageal reflux disease without esophagitis: Secondary | ICD-10-CM | POA: Insufficient documentation

## 2020-07-19 DIAGNOSIS — B351 Tinea unguium: Secondary | ICD-10-CM

## 2020-07-19 DIAGNOSIS — R296 Repeated falls: Secondary | ICD-10-CM | POA: Diagnosis not present

## 2020-07-19 DIAGNOSIS — R2689 Other abnormalities of gait and mobility: Secondary | ICD-10-CM | POA: Diagnosis not present

## 2020-07-19 DIAGNOSIS — I5032 Chronic diastolic (congestive) heart failure: Secondary | ICD-10-CM | POA: Diagnosis not present

## 2020-07-19 DIAGNOSIS — M450 Ankylosing spondylitis of multiple sites in spine: Secondary | ICD-10-CM | POA: Diagnosis not present

## 2020-07-19 DIAGNOSIS — R1310 Dysphagia, unspecified: Secondary | ICD-10-CM | POA: Insufficient documentation

## 2020-07-19 HISTORY — DX: Arthrodesis status: Z98.1

## 2020-07-20 DIAGNOSIS — J9601 Acute respiratory failure with hypoxia: Secondary | ICD-10-CM | POA: Diagnosis not present

## 2020-07-20 DIAGNOSIS — J441 Chronic obstructive pulmonary disease with (acute) exacerbation: Secondary | ICD-10-CM | POA: Diagnosis not present

## 2020-07-20 DIAGNOSIS — R0902 Hypoxemia: Secondary | ICD-10-CM | POA: Diagnosis not present

## 2020-07-21 DIAGNOSIS — M47812 Spondylosis without myelopathy or radiculopathy, cervical region: Secondary | ICD-10-CM | POA: Diagnosis not present

## 2020-07-22 DIAGNOSIS — J449 Chronic obstructive pulmonary disease, unspecified: Secondary | ICD-10-CM | POA: Diagnosis not present

## 2020-07-22 DIAGNOSIS — I5032 Chronic diastolic (congestive) heart failure: Secondary | ICD-10-CM | POA: Diagnosis not present

## 2020-07-22 DIAGNOSIS — R296 Repeated falls: Secondary | ICD-10-CM | POA: Diagnosis not present

## 2020-07-22 DIAGNOSIS — M450 Ankylosing spondylitis of multiple sites in spine: Secondary | ICD-10-CM | POA: Diagnosis not present

## 2020-07-22 DIAGNOSIS — R251 Tremor, unspecified: Secondary | ICD-10-CM | POA: Diagnosis not present

## 2020-07-22 DIAGNOSIS — K5130 Ulcerative (chronic) rectosigmoiditis without complications: Secondary | ICD-10-CM | POA: Diagnosis not present

## 2020-07-22 DIAGNOSIS — R531 Weakness: Secondary | ICD-10-CM | POA: Diagnosis not present

## 2020-07-22 DIAGNOSIS — R2689 Other abnormalities of gait and mobility: Secondary | ICD-10-CM | POA: Diagnosis not present

## 2020-07-25 NOTE — Progress Notes (Signed)
Subjective: [name] is a pleasant 80 y.o. male patient seen today painful thick toenails that are difficult to trim. Pain interferes with ambulation. Aggravating factors include wearing enclosed shoe gear. Pain is relieved with periodic professional debridement.  His daughter is present during today's visit. They state he was hospitalized earlier this month. His feet were really swollen and he had multiple blood blisters on his feet.The swelling did eventually subside and his feet are better.  PCP is [name], MD. Last visit was: 03/18/2020.  No Known Allergies  Objective: Physical Exam  General: [name] is a pleasant 80 y.o. Caucasian male, morbidly obese in NAD. AAO x 3.   Vascular:  Capillary fill time to digits <3 seconds b/l lower extremities. Palpable DP pulse(s) b/l lower extremities Palpable PT pulse(s) b/l lower extremities Pedal hair absent. Lower extremity skin temperature gradient within normal limits. Trace edema noted b/l lower extremities.  Dermatological:  Pedal skin with normal turgor, texture and tone bilaterally. No open wounds bilaterally. No interdigital macerations bilaterally. Toenails 1-5 b/l elongated, discolored, dystrophic, thickened, crumbly with subungual debris and tenderness to dorsal palpation. He has multiple dried flat macules on the dorsal aspect of both feet. No erythema, no edema, no drainage, no fluctuance.  Musculoskeletal:  Normal muscle strength 5/5 to all lower extremity muscle groups bilaterally. No pain crepitus or joint limitation noted with ROM b/l. No gross bony deformities bilaterally.  Neurological:  Protective sensation intact 5/5 intact bilaterally with 10g monofilament b/l.  Assessment and Plan:  1. Pain due to onychomycosis of toenail    -Examined patient. -Patient to continue soft, supportive shoe gear daily. -Toenails 1-5 b/l were debrided in length and girth with sterile nail nippers and dremel without  iatrogenic bleeding.  -Patient to report any pedal injuries to medical professional immediately. -Patient/POA to call should there be question/concern in the interim.  Return in about 3 months (around 10/19/2020).  Marzetta Board, DPM

## 2020-07-26 DIAGNOSIS — R2689 Other abnormalities of gait and mobility: Secondary | ICD-10-CM | POA: Diagnosis not present

## 2020-07-26 DIAGNOSIS — R251 Tremor, unspecified: Secondary | ICD-10-CM | POA: Diagnosis not present

## 2020-07-26 DIAGNOSIS — M450 Ankylosing spondylitis of multiple sites in spine: Secondary | ICD-10-CM | POA: Diagnosis not present

## 2020-07-26 DIAGNOSIS — R296 Repeated falls: Secondary | ICD-10-CM | POA: Diagnosis not present

## 2020-07-26 DIAGNOSIS — J449 Chronic obstructive pulmonary disease, unspecified: Secondary | ICD-10-CM | POA: Diagnosis not present

## 2020-07-26 DIAGNOSIS — K5130 Ulcerative (chronic) rectosigmoiditis without complications: Secondary | ICD-10-CM | POA: Diagnosis not present

## 2020-07-26 DIAGNOSIS — R531 Weakness: Secondary | ICD-10-CM | POA: Diagnosis not present

## 2020-07-26 DIAGNOSIS — I5032 Chronic diastolic (congestive) heart failure: Secondary | ICD-10-CM | POA: Diagnosis not present

## 2020-08-04 DIAGNOSIS — R2689 Other abnormalities of gait and mobility: Secondary | ICD-10-CM | POA: Diagnosis not present

## 2020-08-04 DIAGNOSIS — J449 Chronic obstructive pulmonary disease, unspecified: Secondary | ICD-10-CM | POA: Diagnosis not present

## 2020-08-04 DIAGNOSIS — I5032 Chronic diastolic (congestive) heart failure: Secondary | ICD-10-CM | POA: Diagnosis not present

## 2020-08-04 DIAGNOSIS — R531 Weakness: Secondary | ICD-10-CM | POA: Diagnosis not present

## 2020-08-04 DIAGNOSIS — R251 Tremor, unspecified: Secondary | ICD-10-CM | POA: Diagnosis not present

## 2020-08-04 DIAGNOSIS — M450 Ankylosing spondylitis of multiple sites in spine: Secondary | ICD-10-CM | POA: Diagnosis not present

## 2020-08-04 DIAGNOSIS — R296 Repeated falls: Secondary | ICD-10-CM | POA: Diagnosis not present

## 2020-08-04 DIAGNOSIS — K5130 Ulcerative (chronic) rectosigmoiditis without complications: Secondary | ICD-10-CM | POA: Diagnosis not present

## 2020-08-09 ENCOUNTER — Telehealth: Payer: Self-pay | Admitting: Family Medicine

## 2020-08-09 DIAGNOSIS — R531 Weakness: Secondary | ICD-10-CM | POA: Diagnosis not present

## 2020-08-09 DIAGNOSIS — M450 Ankylosing spondylitis of multiple sites in spine: Secondary | ICD-10-CM | POA: Diagnosis not present

## 2020-08-09 DIAGNOSIS — R251 Tremor, unspecified: Secondary | ICD-10-CM | POA: Diagnosis not present

## 2020-08-09 DIAGNOSIS — I5032 Chronic diastolic (congestive) heart failure: Secondary | ICD-10-CM | POA: Diagnosis not present

## 2020-08-09 DIAGNOSIS — K5130 Ulcerative (chronic) rectosigmoiditis without complications: Secondary | ICD-10-CM | POA: Diagnosis not present

## 2020-08-09 DIAGNOSIS — J449 Chronic obstructive pulmonary disease, unspecified: Secondary | ICD-10-CM | POA: Diagnosis not present

## 2020-08-09 DIAGNOSIS — R296 Repeated falls: Secondary | ICD-10-CM | POA: Diagnosis not present

## 2020-08-09 DIAGNOSIS — R2689 Other abnormalities of gait and mobility: Secondary | ICD-10-CM | POA: Diagnosis not present

## 2020-08-09 NOTE — Telephone Encounter (Signed)
Noted thank you

## 2020-08-09 NOTE — Telephone Encounter (Signed)
[facility]  Home health called to let you know that patient has a decrease in weight. He is weighting 291 now. If any questions called Brad Huff-[phone]

## 2020-08-10 DIAGNOSIS — G4733 Obstructive sleep apnea (adult) (pediatric): Secondary | ICD-10-CM | POA: Diagnosis not present

## 2020-08-18 DIAGNOSIS — R531 Weakness: Secondary | ICD-10-CM | POA: Diagnosis not present

## 2020-08-18 DIAGNOSIS — R296 Repeated falls: Secondary | ICD-10-CM | POA: Diagnosis not present

## 2020-08-18 DIAGNOSIS — J449 Chronic obstructive pulmonary disease, unspecified: Secondary | ICD-10-CM | POA: Diagnosis not present

## 2020-08-18 DIAGNOSIS — R251 Tremor, unspecified: Secondary | ICD-10-CM | POA: Diagnosis not present

## 2020-08-18 DIAGNOSIS — I5032 Chronic diastolic (congestive) heart failure: Secondary | ICD-10-CM | POA: Diagnosis not present

## 2020-08-18 DIAGNOSIS — K5130 Ulcerative (chronic) rectosigmoiditis without complications: Secondary | ICD-10-CM | POA: Diagnosis not present

## 2020-08-18 DIAGNOSIS — M450 Ankylosing spondylitis of multiple sites in spine: Secondary | ICD-10-CM | POA: Diagnosis not present

## 2020-08-18 DIAGNOSIS — R2689 Other abnormalities of gait and mobility: Secondary | ICD-10-CM | POA: Diagnosis not present

## 2020-08-19 ENCOUNTER — Telehealth: Payer: Self-pay | Admitting: *Deleted

## 2020-08-19 DIAGNOSIS — R0902 Hypoxemia: Secondary | ICD-10-CM | POA: Diagnosis not present

## 2020-08-19 DIAGNOSIS — J9601 Acute respiratory failure with hypoxia: Secondary | ICD-10-CM | POA: Diagnosis not present

## 2020-08-19 DIAGNOSIS — J441 Chronic obstructive pulmonary disease with (acute) exacerbation: Secondary | ICD-10-CM | POA: Diagnosis not present

## 2020-08-19 NOTE — Telephone Encounter (Signed)
Pt's daughter [name] called and states pt complained of not being able to catch his breath this morning and she gave him albuterol inhaler and he said it just helped a little. Also grabbing his chest saying he has gas and he declined to let daughter take him to ED. Advised Angie to call 911 and have EMS come out. She states she will call EMS.

## 2020-08-20 ENCOUNTER — Other Ambulatory Visit: Payer: Self-pay | Admitting: Family Medicine

## 2020-08-22 DIAGNOSIS — K51311 Ulcerative (chronic) rectosigmoiditis with rectal bleeding: Secondary | ICD-10-CM | POA: Diagnosis not present

## 2020-08-22 DIAGNOSIS — R140 Abdominal distension (gaseous): Secondary | ICD-10-CM | POA: Diagnosis not present

## 2020-08-22 NOTE — Telephone Encounter (Signed)
Please confirm with daughter that he is still taking this may have 90-day with 1 refill

## 2020-08-23 DIAGNOSIS — R2689 Other abnormalities of gait and mobility: Secondary | ICD-10-CM | POA: Diagnosis not present

## 2020-08-23 DIAGNOSIS — R296 Repeated falls: Secondary | ICD-10-CM | POA: Diagnosis not present

## 2020-08-23 DIAGNOSIS — M450 Ankylosing spondylitis of multiple sites in spine: Secondary | ICD-10-CM | POA: Diagnosis not present

## 2020-08-23 DIAGNOSIS — J449 Chronic obstructive pulmonary disease, unspecified: Secondary | ICD-10-CM | POA: Diagnosis not present

## 2020-08-23 DIAGNOSIS — R531 Weakness: Secondary | ICD-10-CM | POA: Diagnosis not present

## 2020-08-23 DIAGNOSIS — I5032 Chronic diastolic (congestive) heart failure: Secondary | ICD-10-CM | POA: Diagnosis not present

## 2020-08-23 DIAGNOSIS — K5130 Ulcerative (chronic) rectosigmoiditis without complications: Secondary | ICD-10-CM | POA: Diagnosis not present

## 2020-08-23 DIAGNOSIS — R251 Tremor, unspecified: Secondary | ICD-10-CM | POA: Diagnosis not present

## 2020-08-23 NOTE — Telephone Encounter (Signed)
Daughter stated the medication was stopped by Dr Nicki Reaper and he is no longer taking the medication.

## 2020-08-25 ENCOUNTER — Other Ambulatory Visit: Payer: Self-pay | Admitting: Nurse Practitioner

## 2020-08-25 ENCOUNTER — Telehealth: Payer: Self-pay | Admitting: Family Medicine

## 2020-08-25 DIAGNOSIS — J441 Chronic obstructive pulmonary disease with (acute) exacerbation: Secondary | ICD-10-CM

## 2020-08-25 DIAGNOSIS — J9611 Chronic respiratory failure with hypoxia: Secondary | ICD-10-CM

## 2020-08-25 MED ORDER — KETOCONAZOLE 2 % EX CREA: 1.0000 "application " | TOPICAL_CREAM | Freq: Two times a day (BID) | CUTANEOUS | 0 refills | Status: DC

## 2020-08-25 MED ORDER — ALBUTEROL SULFATE HFA 108 (90 BASE) MCG/ACT IN AERS: 2.0000 | INHALATION_SPRAY | Freq: Four times a day (QID) | RESPIRATORY_TRACT | 2 refills | Status: DC | PRN

## 2020-08-25 NOTE — Telephone Encounter (Signed)
Called EMS last Friday due to not being able to get breath. Had one albuterol; needs refills on albuterol.   Pt has yeast in groin area-about 3 inches on each side. Family was putting cream on him that we prescribed but that is not helping. Pt will sit with urine on him. Daughter states it went from deep pink to purple.   [facility] on [address] and [address]  Please advise. Thank you

## 2020-08-25 NOTE — Telephone Encounter (Signed)
Daughter (angela) calling stating yeast inside of both legs where private area is and its purple please advise. [phone]

## 2020-08-25 NOTE — Telephone Encounter (Signed)
Pt daughter contacted and verbalized understanding.

## 2020-09-19 DIAGNOSIS — J9601 Acute respiratory failure with hypoxia: Secondary | ICD-10-CM | POA: Diagnosis not present

## 2020-09-19 DIAGNOSIS — J441 Chronic obstructive pulmonary disease with (acute) exacerbation: Secondary | ICD-10-CM | POA: Diagnosis not present

## 2020-09-19 DIAGNOSIS — R0902 Hypoxemia: Secondary | ICD-10-CM | POA: Diagnosis not present

## 2020-09-23 ENCOUNTER — Ambulatory Visit (INDEPENDENT_AMBULATORY_CARE_PROVIDER_SITE_OTHER): Payer: Medicare Other | Admitting: Family Medicine

## 2020-09-23 ENCOUNTER — Other Ambulatory Visit: Payer: Self-pay

## 2020-09-23 VITALS — BP 138/62 | Ht 73.0 in | Wt 297.0 lb

## 2020-09-23 DIAGNOSIS — I5032 Chronic diastolic (congestive) heart failure: Secondary | ICD-10-CM | POA: Diagnosis not present

## 2020-09-23 DIAGNOSIS — F321 Major depressive disorder, single episode, moderate: Secondary | ICD-10-CM

## 2020-09-23 DIAGNOSIS — I10 Essential (primary) hypertension: Secondary | ICD-10-CM | POA: Diagnosis not present

## 2020-09-23 DIAGNOSIS — K5190 Ulcerative colitis, unspecified, without complications: Secondary | ICD-10-CM

## 2020-09-23 DIAGNOSIS — J449 Chronic obstructive pulmonary disease, unspecified: Secondary | ICD-10-CM

## 2020-09-23 MED ORDER — VITAMIN D (ERGOCALCIFEROL) 1.25 MG (50000 UNIT) PO CAPS: 50000.0000 [IU] | ORAL_CAPSULE | ORAL | 0 refills | Status: DC

## 2020-09-23 NOTE — Progress Notes (Addendum)
   Subjective:    Patient ID: [name], male    DOB: 1940-09-23, 80 y.o.   MRN: [id number]  Hypertension This is a chronic problem. The current episode started more than 1 year ago. Risk factors for coronary artery disease include male gender and dyslipidemia.   His daughter states that sometimes he sits around or lays around in bed does not get out much.  Sits up to watch TV.  Walks around a little bit in the house with a walker.  No falls recently.  They did inquire about driving from [place] to [place] I do not believe he should not drive in [place] or up to here  As for driving immediately around his house potentially limited amounts but only with family supervision  Daughter does a good job taking care of him  Patient does get out of breath with activity but denies chest pains or shortness of breath out of characteristic for his underlying disease  Ulcerative colitis under decent control  Review of Systems     Objective:   Physical Exam General-in no acute distress Eyes-no discharge Lungs-respiratory rate normal, CTA CV-no murmurs,RRR Extremities skin warm dry no edema Neuro grossly normal Behavior normal, alert        Assessment & Plan:  1. Essential hypertension Blood pressure good control continue current measures  2. Chronic diastolic CHF (congestive heart failure) ([name]) CHF stable on current measures.  No adjustments necessary  3. Chronic obstructive pulmonary disease, unspecified COPD type ([place]) COPD stable continue current inhaler  4. Ulcerative colitis without complications, unspecified location [facility]) Under care of GI currently stable  5. Morbid obesity ([place]) Keeping weight under control to some degree portion control  6. Depression, major, single episode, moderate ([place]) Patient on sertraline.  Recommend better socialization patient does not get out much Patient is not suicidal he just states that he is old and he is ready to go.  We  did discuss how the importance of getting out of the house some with his family need to have some socialization will help.  Unfortunately he does not have many friends because he has outlived them all.  Follow-up here in 3 months in 3 months we will do lab work sooner if any problems Senior flu shot this fall If any problems notify us  Patient states he is ready to go.  He states he is not afraid to die.  He has a birthday coming up.  Hopefully he will hang in there but I do believe he has a mature outlook on life

## 2020-10-09 ENCOUNTER — Ambulatory Visit: Payer: Medicare Other

## 2020-10-14 ENCOUNTER — Other Ambulatory Visit: Payer: Self-pay | Admitting: Cardiovascular Disease

## 2020-10-19 ENCOUNTER — Telehealth: Payer: Self-pay | Admitting: Family Medicine

## 2020-10-19 NOTE — Telephone Encounter (Signed)
Patient is requesting refill on furosemide 40 mg called into [facility] [address] [facility]

## 2020-10-19 NOTE — Telephone Encounter (Signed)
Contacted [facility] and pt has 2 refills left. [facility] will get one refill ready and it will be ready in one hour. Pt daughter [name] contacted and verbalized understanding.

## 2020-10-20 DIAGNOSIS — J9601 Acute respiratory failure with hypoxia: Secondary | ICD-10-CM | POA: Diagnosis not present

## 2020-10-20 DIAGNOSIS — R0902 Hypoxemia: Secondary | ICD-10-CM | POA: Diagnosis not present

## 2020-10-20 DIAGNOSIS — J441 Chronic obstructive pulmonary disease with (acute) exacerbation: Secondary | ICD-10-CM | POA: Diagnosis not present

## 2020-10-21 ENCOUNTER — Other Ambulatory Visit: Payer: Self-pay | Admitting: Nurse Practitioner

## 2020-10-28 ENCOUNTER — Emergency Department (HOSPITAL_COMMUNITY): Payer: Medicare Other

## 2020-10-28 ENCOUNTER — Other Ambulatory Visit: Payer: Self-pay

## 2020-10-28 ENCOUNTER — Observation Stay (HOSPITAL_COMMUNITY)
Admission: EM | Admit: 2020-10-28 | Discharge: 2020-10-30 | Disposition: A | Discharge status: Home / Self Care | Payer: Medicare Other | Attending: Internal Medicine | Admitting: Internal Medicine

## 2020-10-28 DIAGNOSIS — J439 Emphysema, unspecified: Secondary | ICD-10-CM | POA: Diagnosis not present

## 2020-10-28 DIAGNOSIS — M4322 Fusion of spine, cervical region: Secondary | ICD-10-CM | POA: Diagnosis not present

## 2020-10-28 DIAGNOSIS — K5130 Ulcerative (chronic) rectosigmoiditis without complications: Secondary | ICD-10-CM | POA: Diagnosis present

## 2020-10-28 DIAGNOSIS — I130 Hypertensive heart and chronic kidney disease with heart failure and stage 1 through stage 4 chronic kidney disease, or unspecified chronic kidney disease: Secondary | ICD-10-CM | POA: Diagnosis not present

## 2020-10-28 DIAGNOSIS — Z7982 Long term (current) use of aspirin: Secondary | ICD-10-CM | POA: Diagnosis not present

## 2020-10-28 DIAGNOSIS — R7303 Prediabetes: Secondary | ICD-10-CM | POA: Diagnosis not present

## 2020-10-28 DIAGNOSIS — I2510 Atherosclerotic heart disease of native coronary artery without angina pectoris: Secondary | ICD-10-CM

## 2020-10-28 DIAGNOSIS — S060X9A Concussion with loss of consciousness of unspecified duration, initial encounter: Secondary | ICD-10-CM

## 2020-10-28 DIAGNOSIS — R55 Syncope and collapse: Secondary | ICD-10-CM

## 2020-10-28 DIAGNOSIS — S060X0A Concussion without loss of consciousness, initial encounter: Secondary | ICD-10-CM | POA: Diagnosis not present

## 2020-10-28 DIAGNOSIS — W19XXXA Unspecified fall, initial encounter: Secondary | ICD-10-CM | POA: Diagnosis not present

## 2020-10-28 DIAGNOSIS — W109XXA Fall (on) (from) unspecified stairs and steps, initial encounter: Secondary | ICD-10-CM | POA: Diagnosis not present

## 2020-10-28 DIAGNOSIS — R6889 Other general symptoms and signs: Secondary | ICD-10-CM | POA: Diagnosis not present

## 2020-10-28 DIAGNOSIS — Y9301 Activity, walking, marching and hiking: Secondary | ICD-10-CM | POA: Diagnosis not present

## 2020-10-28 DIAGNOSIS — K51311 Ulcerative (chronic) rectosigmoiditis with rectal bleeding: Secondary | ICD-10-CM | POA: Diagnosis present

## 2020-10-28 DIAGNOSIS — I5032 Chronic diastolic (congestive) heart failure: Secondary | ICD-10-CM | POA: Diagnosis present

## 2020-10-28 DIAGNOSIS — Z955 Presence of coronary angioplasty implant and graft: Secondary | ICD-10-CM | POA: Diagnosis not present

## 2020-10-28 DIAGNOSIS — M47812 Spondylosis without myelopathy or radiculopathy, cervical region: Secondary | ICD-10-CM | POA: Diagnosis not present

## 2020-10-28 DIAGNOSIS — S63287A Dislocation of proximal interphalangeal joint of left little finger, initial encounter: Principal | ICD-10-CM | POA: Insufficient documentation

## 2020-10-28 DIAGNOSIS — Z79899 Other long term (current) drug therapy: Secondary | ICD-10-CM | POA: Insufficient documentation

## 2020-10-28 DIAGNOSIS — Z87891 Personal history of nicotine dependence: Secondary | ICD-10-CM | POA: Diagnosis not present

## 2020-10-28 DIAGNOSIS — Z85828 Personal history of other malignant neoplasm of skin: Secondary | ICD-10-CM | POA: Insufficient documentation

## 2020-10-28 DIAGNOSIS — Z981 Arthrodesis status: Secondary | ICD-10-CM | POA: Diagnosis not present

## 2020-10-28 DIAGNOSIS — S0003XA Contusion of scalp, initial encounter: Secondary | ICD-10-CM | POA: Diagnosis not present

## 2020-10-28 DIAGNOSIS — S6292XA Unspecified fracture of left wrist and hand, initial encounter for closed fracture: Secondary | ICD-10-CM

## 2020-10-28 DIAGNOSIS — R609 Edema, unspecified: Secondary | ICD-10-CM | POA: Diagnosis not present

## 2020-10-28 DIAGNOSIS — D631 Anemia in chronic kidney disease: Secondary | ICD-10-CM | POA: Diagnosis not present

## 2020-10-28 DIAGNOSIS — S6291XA Unspecified fracture of right wrist and hand, initial encounter for closed fracture: Secondary | ICD-10-CM

## 2020-10-28 DIAGNOSIS — E669 Obesity, unspecified: Secondary | ICD-10-CM

## 2020-10-28 DIAGNOSIS — R0902 Hypoxemia: Secondary | ICD-10-CM | POA: Diagnosis not present

## 2020-10-28 DIAGNOSIS — Z20822 Contact with and (suspected) exposure to covid-19: Secondary | ICD-10-CM | POA: Diagnosis not present

## 2020-10-28 DIAGNOSIS — E66813 Obesity, class 3: Secondary | ICD-10-CM

## 2020-10-28 DIAGNOSIS — S060XAA Concussion with loss of consciousness status unknown, initial encounter: Secondary | ICD-10-CM

## 2020-10-28 DIAGNOSIS — N1831 Chronic kidney disease, stage 3a: Secondary | ICD-10-CM | POA: Diagnosis not present

## 2020-10-28 DIAGNOSIS — J449 Chronic obstructive pulmonary disease, unspecified: Secondary | ICD-10-CM | POA: Diagnosis not present

## 2020-10-28 DIAGNOSIS — S62231A Other displaced fracture of base of first metacarpal bone, right hand, initial encounter for closed fracture: Secondary | ICD-10-CM | POA: Diagnosis not present

## 2020-10-28 DIAGNOSIS — J45909 Unspecified asthma, uncomplicated: Secondary | ICD-10-CM | POA: Insufficient documentation

## 2020-10-28 DIAGNOSIS — S62317A Displaced fracture of base of fifth metacarpal bone. left hand, initial encounter for closed fracture: Secondary | ICD-10-CM | POA: Diagnosis not present

## 2020-10-28 DIAGNOSIS — S0990XA Unspecified injury of head, initial encounter: Secondary | ICD-10-CM | POA: Diagnosis present

## 2020-10-28 DIAGNOSIS — Z043 Encounter for examination and observation following other accident: Secondary | ICD-10-CM | POA: Diagnosis not present

## 2020-10-28 DIAGNOSIS — I10 Essential (primary) hypertension: Secondary | ICD-10-CM | POA: Diagnosis present

## 2020-10-28 DIAGNOSIS — Z743 Need for continuous supervision: Secondary | ICD-10-CM | POA: Diagnosis not present

## 2020-10-28 DIAGNOSIS — Z9861 Coronary angioplasty status: Secondary | ICD-10-CM

## 2020-10-28 DIAGNOSIS — I517 Cardiomegaly: Secondary | ICD-10-CM | POA: Diagnosis not present

## 2020-10-28 DIAGNOSIS — M160 Bilateral primary osteoarthritis of hip: Secondary | ICD-10-CM | POA: Diagnosis not present

## 2020-10-28 DIAGNOSIS — G4733 Obstructive sleep apnea (adult) (pediatric): Secondary | ICD-10-CM | POA: Diagnosis present

## 2020-10-28 LAB — I-STAT CHEM 8, ED
BUN: 18 mg/dL (ref 8–23)
Calcium, Ion: 1.28 mmol/L (ref 1.15–1.40)
Chloride: 104 mmol/L (ref 98–111)
Creatinine, Ser: 1.3 mg/dL — ABNORMAL HIGH (ref 0.61–1.24)
Glucose, Bld: 147 mg/dL — ABNORMAL HIGH (ref 70–99)
HCT: 43 % (ref 39.0–52.0)
Hemoglobin: 14.6 g/dL (ref 13.0–17.0)
Potassium: 3.9 mmol/L (ref 3.5–5.1)
Sodium: 143 mmol/L (ref 135–145)
TCO2: 31 mmol/L (ref 22–32)

## 2020-10-28 LAB — COMPREHENSIVE METABOLIC PANEL
ALT: 17 U/L (ref 0–44)
AST: 29 U/L (ref 15–41)
Albumin: 3.4 g/dL — ABNORMAL LOW (ref 3.5–5.0)
Alkaline Phosphatase: 83 U/L (ref 38–126)
Anion gap: 8 (ref 5–15)
BUN: 15 mg/dL (ref 8–23)
CO2: 27 mmol/L (ref 22–32)
Calcium: 9.9 mg/dL (ref 8.9–10.3)
Chloride: 106 mmol/L (ref 98–111)
Creatinine, Ser: 1.36 mg/dL — ABNORMAL HIGH (ref 0.61–1.24)
GFR, Estimated: 52 mL/min — ABNORMAL LOW (ref >60)
Glucose, Bld: 147 mg/dL — ABNORMAL HIGH (ref 70–99)
Potassium: 4 mmol/L (ref 3.5–5.1)
Sodium: 141 mmol/L (ref 135–145)
Total Bilirubin: 0.5 mg/dL (ref 0.3–1.2)
Total Protein: 7.1 g/dL (ref 6.5–8.1)

## 2020-10-28 LAB — CBC WITH DIFFERENTIAL/PLATELET
Abs Immature Granulocytes: 0.08 10*3/uL — ABNORMAL HIGH (ref 0.00–0.07)
Basophils Absolute: 0.1 10*3/uL (ref 0.0–0.1)
Basophils Relative: 1 %
Eosinophils Absolute: 0.4 10*3/uL (ref 0.0–0.5)
Eosinophils Relative: 4 %
HCT: 44.8 % (ref 39.0–52.0)
Hemoglobin: 14.3 g/dL (ref 13.0–17.0)
Immature Granulocytes: 1 %
Lymphocytes Relative: 29 %
Lymphs Abs: 2.9 10*3/uL (ref 0.7–4.0)
MCH: 28.6 pg (ref 26.0–34.0)
MCHC: 31.9 g/dL (ref 30.0–36.0)
MCV: 89.6 fL (ref 80.0–100.0)
Monocytes Absolute: 0.7 10*3/uL (ref 0.1–1.0)
Monocytes Relative: 7 %
Neutro Abs: 5.9 10*3/uL (ref 1.7–7.7)
Neutrophils Relative %: 58 %
Platelets: 207 10*3/uL (ref 150–400)
RBC: 5 MIL/uL (ref 4.22–5.81)
RDW: 13.9 % (ref 11.5–15.5)
WBC: 10.1 10*3/uL (ref 4.0–10.5)
nRBC: 0 % (ref 0.0–0.2)

## 2020-10-28 LAB — TROPONIN I (HIGH SENSITIVITY): Troponin I (High Sensitivity): 17 ng/L (ref <18)

## 2020-10-28 MED ORDER — LIDOCAINE HCL 2 % IJ SOLN
10.0000 mL | Freq: Once | INTRAMUSCULAR | Status: AC
  Administered 2020-10-28: 200 mg
  Filled 2020-10-28: qty 20

## 2020-10-28 MED ORDER — MORPHINE SULFATE (PF) 4 MG/ML IV SOLN
4.0000 mg | Freq: Once | INTRAVENOUS | Status: AC
  Administered 2020-10-28: 4 mg via INTRAVENOUS
  Filled 2020-10-28: qty 1

## 2020-10-28 NOTE — ED Provider Notes (Signed)
[facility] EMERGENCY DEPARTMENT Provider Note   CSN: [id number] Arrival date & time: 10/28/20  2100     History Chief Complaint  Patient presents with   [name] is a 80 y.o. male with PMHx HTN, HLD, CAD, CHF, COPD, IBD, OSA, obesity who presents for evaluation of syncope, as well as injury sustained during a fall.  Patient is accompanied today by his daughter, who provides additional history.  The patient his family were reportedly walking up to a restaurant when the patient experienced a syncopal episode and fell forward, striking his head against the ground.  He primarily landed on his left side, and he was noted to have a large bump to his forehead, as well as deformity of his left pinky finger.  He had significant pain at his right hand and left wrist as well. He was slightly confused for a few minutes after this incident but subsequently returned to his normal neurologic baseline.  He is amnestic to events leading up to arrival in the ED.  No seizure-like activity was witnessed.   EMS was called, and the patient was subsequently transported to our emergency department for further evaluation.      Past Medical History:  Diagnosis Date   Ankylosing spondylitis ([place]) 11/07/2018   Asthma    BPH (benign prostatic hyperplasia)    CAD (coronary artery disease) 01/1995   CHF (congestive heart failure) ([name])    Clostridium difficile colitis 04/2005   Colitis 2011   COPD (chronic obstructive pulmonary disease) ([name])    Depression    Diverticulosis    DJD (degenerative joint disease)    Gastric ulcer 04/17/10   Three 57m gastric ulcers, H.pylori serologies were negative   GERD (gastroesophageal reflux disease)    History of kidney stones    Hyperlipidemia    Hypertension    Idiopathic chronic inflammatory bowel disease 05/18/2010   left-sided UC   Kidney stone    Morbid obesity ([place] 03/12/2018   Obstructive sleep apnea    on Cpap   Reflux  02/1995   S/P endoscopy 07/24/10   retained gastric contents, benign bx    Patient Active Problem List   Diagnosis Date Noted   Syncope 10/29/2020   Dysphagia 07/19/2020   Status post cervical spinal fusion 07/19/2020   Cervical spondylosis 03/07/2020   DNR (do not resuscitate) 08/17/2019   Shortness of breath 04/09/2019   COPD (chronic obstructive pulmonary disease) ([place] 04/09/2019   Chronic ulcerative proctosigmoiditis, without complications ([place] 023/53/6144  Chronic respiratory failure with hypoxia ([place] 04/09/2019   Obesity with body mass index (BMI) of 30.0 to 39.9 04/09/2019   Acute on chronic respiratory failure with hypoxia and hypercapnia ([facility] 12/23/2018   Acute respiratory failure with hypoxia ([facility] 12/21/2018   Ankylosing spondylitis ([place] 11/07/2018   Disseminated idiopathic skeletal hyperostosis 10/22/2018   Neck pain 10/22/2018   Rectal bleeding 08/26/2018   Chronic diastolic CHF (congestive heart failure) ([place] 03/27/2018   Exertional dyspnea 03/15/2018   Morbid obesity ([place] 03/12/2018   History of carpal tunnel surgery of left wrist with unlar head resection 01/17/17 01/24/2017   Closed fracture of distal radius and ulna, left, with malunion, subsequent encounter    Carpal tunnel syndrome of left wrist    Fracture, [place], left, closed 11/29/2016   Hx of skin cancer, basal cell 11/30/2015   Erectile dysfunction 06/06/2015   Iron deficiency 08/18/2014   Fecal incontinence 02/10/2014   Essential hypertension 01/09/2013  Dyslipidemia, goal LDL below 70 01/09/2013   Chest pain at rest 12/14/2012   CAD S/P percutaneous coronary angioplasty 12/14/2012   Hiatal hernia 12/14/2012   GERD (gastroesophageal reflux disease) 12/14/2012   Prediabetes 12/09/2012   BPH (benign prostatic hyperplasia) 11/18/2012   RUQ pain 09/04/2012   Gallstones 05/09/2012   Obstructive sleep apnea 04/30/2012   Ulcerative colitis ([place]) 04/29/2012   Ulcerative colitis, left sided ([place])  12/26/2011   Diarrhea 10/18/2010    Past Surgical History:  Procedure Laterality Date   ANORECTAL MANOMETRY  2016   [facility]: concern for possible fissure. Noted pelvic floor dyssnyergy   BIOPSY  07/29/2017   Procedure: BIOPSY;  Surgeon: Daneil Dolin, MD;  Location: [facility] ENDO SUITE;  Service: Endoscopy;;  ascending and sigmoid colon   CARDIAC CATHETERIZATION     with stent   CARDIOVASCULAR STRESS TEST  07/21/2009   No scintigraphic evidence of inducible myocardial ischemia   CARPAL TUNNEL RELEASE Left 01/17/2017   Procedure: LEFT CARPAL TUNNEL RELEASE;  Surgeon: Carole Civil, MD;  Location: [facility] ORS;  Service: Orthopedics;  Laterality: Left;   CATARACT EXTRACTION, BILATERAL Bilateral    CERVICAL SPINE SURGERY     C4-5   COLONOSCOPY  04/2005   granularity and friability erosions from rectum to 40cm. Bx infection vs IBD. C. Diff positive at the time.    COLONOSCOPY  05/2010   [facility]: left-sided UC, bx with no dysplasia, shallow diverticula   COLONOSCOPY [name] 11/07/2012   OLM:BEML-[id number] proctocolitis status post segmental biopsy/Sigmoid colon polyps removed as described above. Procedure compromisd by technical difficulties. bx: Inflammation limited to sigmoid and rectum on pathology.   COLONOSCOPY  04/2014   Dr. Nyoka Cowden at [facility]: well localized proctocolitis limited to sigmoid   COLONOSCOPY WITH PROPOFOL [name] 07/29/2017   diverticulosis in colon, three 4-6 mm polyps at splenic flexure and in cecum, one 10 mm polyp in rectum, abnormal rectum and sigmoid consistent with active UC   CORONARY STENT PLACEMENT  01/1995   ESOPHAGOGASTRODUODENOSCOPY  07/24/2010   EFE:OFHQRF esophagus   ESOPHAGOGASTRODUODENOSCOPY  04/2014   [facility] at [facility]: negative small bowel biopsies   FOOT SURGERY Bilateral two   KNEE SURGERY  two   POLYPECTOMY  07/29/2017   Procedure: POLYPECTOMY;  Surgeon: Daneil Dolin, MD;  Location: [facility];  Service: Endoscopy;;  splenic flexure, ascending colon  polyp;rectal   SHOULDER SURGERY  two   TONSILLECTOMY     TRANSTHORACIC ECHOCARDIOGRAM  03/23/2009   EF 60-65%, normal LV systolic function   ULNAR HEAD EXCISION Left 01/17/2017   Procedure: LEFT ULNAR HEAD RESECTION;  Surgeon: Carole Civil, MD;  Location: [facility] ORS;  Service: Orthopedics;  Laterality: Left;       Family History  Problem Relation Age of Onset   Lung cancer Mother    Cancer Mother        breast   Diabetes Mother    Stroke Father    Hypertension Father    Kidney failure Brother    Other Child        blood infection   Colon cancer Neg Hx     Social History   Tobacco Use   Smoking status: Former    Packs/day: 1.00    Years: 20.00    Pack years: 20.00    Types: Cigarettes    Quit date: 09/30/1969    Years since quitting: 51.1   Smokeless tobacco: Never  Vaping Use   Vaping Use: Never used  Substance Use  Topics   Alcohol use: No   Drug use: No    Home Medications Prior to Admission medications   Medication Sig Start Date End Date Taking? Authorizing Provider  acetaminophen (TYLENOL) 325 MG tablet     [provider]  albuterol (PROVENTIL) (2.5 MG/3ML) 0.083% nebulizer solution Take 3 mLs (2.5 mg total) by nebulization every 6 (six) hours as needed for wheezing or shortness of breath. 08/18/19   Kathie Dike, MD  albuterol (VENTOLIN HFA) 108 (90 Base) MCG/ACT inhaler Inhale 2 puffs into the lungs every 6 (six) hours as needed for wheezing or shortness of breath. 08/25/20   Nilda Simmer, NP  aspirin (BAYER LOW DOSE) 81 MG EC tablet Take by mouth.    [provider]  benzonatate (TESSALON) 200 MG capsule 1 capsule    [provider]  budesonide-formoterol (SYMBICORT) 160-4.5 MCG/ACT inhaler Inhale 2 puffs into the lungs 2 (two) times daily. 06/23/20   [name], MD  celecoxib (CELEBREX) 100 MG capsule Take 100 mg by mouth daily. 04/05/20   [provider]  clonazePAM (KLONOPIN) 0.5 MG tablet     [provider]  doxazosin (CARDURA) 4 MG tablet 1 tablet    [provider]  ferrous sulfate 325 (65 FE) MG tablet Take 1 tablet by mouth daily with breakfast. 08/18/14   [provider]  furosemide (LASIX) 40 MG tablet ONE TO ONE AND A HALF TABLET EACH MORNING AS NEEDED 06/23/20   [name], MD  guaiFENesin (MUCINEX) 600 MG 12 hr tablet     [provider]  ketoconazole (NIZORAL) 2 % cream APPLY TOPICALLY TWICE DAILY. MIX WITH BARRIER CREAM SUCH AS DESITIN 10/23/20   [name], MD  loperamide (IMODIUM) 2 MG capsule     [provider]  mesalamine (LIALDA) 1.2 g EC tablet Take 4.8 g by mouth daily. 03/28/20   [provider]  metoprolol tartrate (LOPRESSOR) 50 MG tablet TAKE 1/2 TABLET BY MOUTH EVERY EVENING 06/27/20   [name], MD  Multiple Vitamin (MULTIVITAMIN WITH MINERALS) TABS tablet Take 1 tablet by mouth daily.    [provider]  mupirocin ointment (BACTROBAN) 2 % Apply BID to raw area 12/09/19   Luking, Elayne Snare, MD  nitroGLYCERIN (NITROSTAT) 0.4 MG SL tablet ONE TABLET UNDER TONGUE AS NEEDED FOR CHEST PAIN EVERY 5 MINUTES AS DIRECTED 10/14/20   Lorretta Harp, MD  pantoprazole (PROTONIX) 40 MG tablet 1 tablet 01/12/20   [provider]  pravastatin (PRAVACHOL) 40 MG tablet Take 1 tablet (40 mg total) by mouth at bedtime. 12/07/19   [name], MD  predniSONE (DELTASONE) 10 MG tablet Take by mouth. 04/14/20   [provider]  sertraline (ZOLOFT) 100 MG tablet TAKE 2 TABLETS BY MOUTH DAILY 06/23/20   [name], MD  traMADol (ULTRAM) 50 MG tablet Take 50 mg by mouth every 6 (six) hours as needed. 01/28/20   [provider]  Vitamin D, Ergocalciferol, (DRISDOL) 1.25 MG (50000 UNIT) CAPS capsule Take 1 capsule (50,000 Units total) by mouth every 7 (seven) days. 09/23/20   [name], MD    Allergies    Patient has no known allergies.  Review of Systems   Review of Systems   Constitutional:  Negative for chills and fever.  HENT:  Negative for ear pain and sore throat.   Eyes:  Negative for pain and visual disturbance.  Respiratory:  Negative for cough and shortness of  breath.   Cardiovascular:  Negative for chest pain and palpitations.  Gastrointestinal:  Negative for abdominal pain and vomiting.  Genitourinary:  Negative for dysuria and hematuria.  Musculoskeletal:  Positive for arthralgias and myalgias. Negative for back pain.  Skin:  Negative for color change and rash.  Neurological:  Positive for syncope. Negative for seizures.  All other systems reviewed and are negative.  Physical Exam Updated Vital Signs BP (!) 169/89   Pulse 79   Temp 97.9 F (36.6 C) (Oral)   Resp 14   Ht 6' (1.829 m)   Wt 133.9 kg   SpO2 98%   BMI 40.04 kg/m   Physical Exam Vitals and nursing note reviewed.  Constitutional:      Appearance: He is well-developed.  HENT:     Head: Normocephalic.     Comments: Large abrasion and hematoma on left forehead.  No laceration.  No palpable skull fracture.    Mouth/Throat:     Mouth: Mucous membranes are moist.     Pharynx: Oropharynx is clear.  Eyes:     Extraocular Movements: Extraocular movements intact.     Conjunctiva/sclera: Conjunctivae normal.     Pupils: Pupils are equal, round, and reactive to light.  Cardiovascular:     Rate and Rhythm: Normal rate and regular rhythm.     Heart sounds: No murmur heard. Pulmonary:     Effort: Pulmonary effort is normal. No respiratory distress.     Breath sounds: Normal breath sounds.  Abdominal:     Palpations: Abdomen is soft.     Tenderness: There is no abdominal tenderness.  Musculoskeletal:        General: Swelling, tenderness and deformity present.     Cervical back: Neck supple.     Comments: Deformity of the left pinky.  Pain with palpation over the fifth metacarpal of the left hand.  Pain with palpation of the left wrist.  Pain with palpation of the right hand as  well.  Abrasions over the left knee.  NVI.  Skin:    General: Skin is warm and dry.     Capillary Refill: Capillary refill takes less than 2 seconds.  Neurological:     General: No focal deficit present.     Mental Status: He is alert and oriented to person, place, and time. Mental status is at baseline.     Cranial Nerves: No cranial nerve deficit.     Sensory: No sensory deficit.     Motor: No weakness.     Coordination: Coordination normal.     Gait: Gait normal.    ED Results / Procedures / Treatments   Labs (all labs ordered are listed, but only abnormal results are displayed) Labs Reviewed  CBC WITH DIFFERENTIAL/PLATELET - Abnormal; Notable for the following components:      Result Value   Abs Immature Granulocytes 0.08 (*)    All other components within normal limits  COMPREHENSIVE METABOLIC PANEL - Abnormal; Notable for the following components:   Glucose, Bld 147 (*)    Creatinine, Ser 1.36 (*)    Albumin 3.4 (*)    GFR, Estimated 52 (*)    All other components within normal limits  BASIC METABOLIC PANEL - Abnormal; Notable for the following components:   Glucose, Bld 161 (*)    Creatinine, Ser 1.31 (*)    GFR, Estimated 54 (*)    All other components within normal limits  CBC - Abnormal; Notable for the following components:   WBC  11.7 (*)    All other components within normal limits  I-STAT CHEM 8, ED - Abnormal; Notable for the following components:   Creatinine, Ser 1.30 (*)    Glucose, Bld 147 (*)    All other components within normal limits  TROPONIN I (HIGH SENSITIVITY)  TROPONIN I (HIGH SENSITIVITY)    EKG EKG Interpretation  Date/Time:  Friday October 28 2020 21:11:08 EDT Ventricular Rate:  81 PR Interval:  323 QRS Duration: 107 QT Interval:  391 QTC Calculation: 454 R Axis:   -26 Text Interpretation: Sinus rhythm Prolonged PR interval Abnormal R-wave progression, late transition LVH with secondary repolarization abnormality No significant  change since last tracing Confirmed by Wandra Arthurs [phone]) on 10/28/2020 9:28:47 PM  Radiology DG Forearm Left  Result Date: 10/28/2020 CLINICAL DATA:  Fall wrist deformity EXAM: LEFT FOREARM - 2 VIEW COMPARISON:  Wrist radiograph 01/17/2017 FINDINGS: no definite acute displaced fracture or malalignment. Chronic postsurgical and posttraumatic deformity at the distal ulna. Evidence of old distal radius fracture. IMPRESSION: No definite acute osseous abnormality Electronically Signed   By: Donavan Foil M.D.   On: 10/28/2020 22:32   DG Wrist Complete Left  Result Date: 10/28/2020 CLINICAL DATA:  Fall with deformity EXAM: LEFT WRIST - COMPLETE 3+ VIEW COMPARISON:  01/17/2017 FINDINGS: Acute comminuted intra-articular fracture at the base of fifth metacarpal with displacement. Chronic postsurgical and posttraumatic deformity of distal ulna. Chronic fracture deformity of the distal radius. No definite acute displaced fracture at the wrist. IMPRESSION: 1. Acute comminuted intra-articular fracture at the base of the fifth metacarpal. 2. Chronic appearing deformities at the distal radius and ulna. Electronically Signed   By: Donavan Foil M.D.   On: 10/28/2020 22:41   DG Knee 2 Views Left  Result Date: 10/28/2020 CLINICAL DATA:  Fall EXAM: LEFT KNEE - 1-2 VIEW COMPARISON:  10/19/2009 FINDINGS: Knee replacement with intact hardware. No definite acute fracture. No sizeable effusion IMPRESSION: Knee replacement without definite acute osseous abnormality Electronically Signed   By: Donavan Foil M.D.   On: 10/28/2020 22:38   CT HEAD WO CONTRAST (5MM)  Result Date: 10/28/2020 CLINICAL DATA:  Status post fall. EXAM: CT HEAD WITHOUT CONTRAST CT CERVICAL SPINE WITHOUT CONTRAST TECHNIQUE: Multidetector CT imaging of the head and cervical spine was performed following the standard protocol without intravenous contrast. Multiplanar CT image reconstructions of the cervical spine were also generated. COMPARISON:  None.  FINDINGS: CT HEAD FINDINGS BRAIN: BRAIN Cerebral ventricle sizes are concordant with the degree of cerebral volume loss. Patchy and confluent areas of decreased attenuation are noted throughout the deep and periventricular white matter of the cerebral hemispheres bilaterally, compatible with chronic microvascular ischemic disease. No evidence of large-territorial acute infarction. No parenchymal hemorrhage. No mass lesion. No extra-axial collection. No mass effect or midline shift. No hydrocephalus. Basilar cisterns are patent. Vascular: No hyperdense vessel. Atherosclerotic calcifications are present within the cavernous internal carotid arteries. Skull: No acute fracture or focal lesion. Sinuses/Orbits: Paranasal sinuses and mastoid air cells are clear. Bilateral lens replacement. Otherwise orbits are unremarkable. Other: Left frontal scalp 2.4 cm scalp hematoma formation. CT CERVICAL SPINE FINDINGS Alignment: Normal. Skull base and vertebrae: C4-C5 anterior surgical fusion with hardware. Multilevel severe degenerative changes of the spine with multilevel severe osseous neural foraminal stenosis. No severe osseous central canal stenosis. No acute fracture. No aggressive appearing focal osseous lesion or focal pathologic process. Soft tissues and spinal canal: No prevertebral fluid or swelling. No visible canal hematoma. Upper chest: Biapical pleural/pulmonary  scarring. Likely trace left pleural effusion. Paraseptal emphysematous changes. Other: At least moderate atherosclerotic plaque of the carotid arteries within the neck. IMPRESSION: 1. No acute intracranial abnormality. 2. A 2.4 cm left frontal scalp hematoma. 3. No acute displaced fracture or traumatic listhesis of the cervical spine in a patient status post C4-C5 anterior fusion. 4. Multilevel severe degenerative changes of the spine with multilevel severe osseous neural foraminal stenosis. 5. Likely trace left pleural effusion. 6.  Emphysema (ICD10-J43.9).  Electronically Signed   By: Iven Finn M.D.   On: 10/28/2020 23:07   CT Cervical Spine Wo Contrast  Result Date: 10/28/2020 CLINICAL DATA:  Status post fall. EXAM: CT HEAD WITHOUT CONTRAST CT CERVICAL SPINE WITHOUT CONTRAST TECHNIQUE: Multidetector CT imaging of the head and cervical spine was performed following the standard protocol without intravenous contrast. Multiplanar CT image reconstructions of the cervical spine were also generated. COMPARISON:  None. FINDINGS: CT HEAD FINDINGS BRAIN: BRAIN Cerebral ventricle sizes are concordant with the degree of cerebral volume loss. Patchy and confluent areas of decreased attenuation are noted throughout the deep and periventricular white matter of the cerebral hemispheres bilaterally, compatible with chronic microvascular ischemic disease. No evidence of large-territorial acute infarction. No parenchymal hemorrhage. No mass lesion. No extra-axial collection. No mass effect or midline shift. No hydrocephalus. Basilar cisterns are patent. Vascular: No hyperdense vessel. Atherosclerotic calcifications are present within the cavernous internal carotid arteries. Skull: No acute fracture or focal lesion. Sinuses/Orbits: Paranasal sinuses and mastoid air cells are clear. Bilateral lens replacement. Otherwise orbits are unremarkable. Other: Left frontal scalp 2.4 cm scalp hematoma formation. CT CERVICAL SPINE FINDINGS Alignment: Normal. Skull base and vertebrae: C4-C5 anterior surgical fusion with hardware. Multilevel severe degenerative changes of the spine with multilevel severe osseous neural foraminal stenosis. No severe osseous central canal stenosis. No acute fracture. No aggressive appearing focal osseous lesion or focal pathologic process. Soft tissues and spinal canal: No prevertebral fluid or swelling. No visible canal hematoma. Upper chest: Biapical pleural/pulmonary scarring. Likely trace left pleural effusion. Paraseptal emphysematous changes. Other: At  least moderate atherosclerotic plaque of the carotid arteries within the neck. IMPRESSION: 1. No acute intracranial abnormality. 2. A 2.4 cm left frontal scalp hematoma. 3. No acute displaced fracture or traumatic listhesis of the cervical spine in a patient status post C4-C5 anterior fusion. 4. Multilevel severe degenerative changes of the spine with multilevel severe osseous neural foraminal stenosis. 5. Likely trace left pleural effusion. 6.  Emphysema (ICD10-J43.9). Electronically Signed   By: Iven Finn M.D.   On: 10/28/2020 23:07   DG Pelvis Portable  Result Date: 10/28/2020 CLINICAL DATA:  Fall EXAM: PORTABLE PELVIS 1-2 VIEWS COMPARISON:  CT 01/07/2020 FINDINGS: SI joints are non widened. Pubic symphysis and rami appear intact. No fracture or dislocation. Degenerative changes of both hips IMPRESSION: No acute osseous abnormality Electronically Signed   By: Donavan Foil M.D.   On: 10/28/2020 22:39   DG Hand 2 View Left  Result Date: 10/29/2020 CLINICAL DATA:  Postreduction. EXAM: LEFT HAND - 2 VIEW COMPARISON:  Pre reduction radiograph yesterday. FINDINGS: Proximal metacarpal fracture is unchanged in alignment or after application of splint material. Chronic changes with multifocal osteoarthritis and probable remote injury to the distal radius and ulna, not significantly changed. IMPRESSION: Unchanged alignment of proximal metacarpal fracture after application of splint material. Electronically Signed   By: Keith Rake M.D.   On: 10/29/2020 00:35   DG Chest Port 1 View  Result Date: 10/28/2020 CLINICAL DATA:  Fall EXAM:  PORTABLE CHEST 1 VIEW COMPARISON:  08/16/2018 FINDINGS: Low lung volumes. Cardiomegaly with central bronchovascular crowding. Possible small left-sided effusion. Aortic atherosclerosis. No visible pneumothorax. Emphysema IMPRESSION: 1. Low lung volumes. 2. Cardiomegaly with bronchovascular crowding. Possible small left effusion Electronically Signed   By: Donavan Foil  M.D.   On: 10/28/2020 22:30   DG Hand Complete Left  Result Date: 10/28/2020 CLINICAL DATA:  Fall EXAM: LEFT HAND - COMPLETE 3+ VIEW COMPARISON:  None. FINDINGS: Acute mildly comminuted fracture involving the base of the fifth metacarpal with about 1/2 shaft diameter dorsal displacement of distal fracture fragment, probable articular surface extension to the [facility] joint. No subluxation. Joint space narrowing and arthritis at the MCP, D IP and PIP joints. IMPRESSION: Acute mildly comminuted, likely intra-articular fracture involving the base of fifth metacarpal with mild displacement Electronically Signed   By: Donavan Foil M.D.   On: 10/28/2020 22:34   DG Hand Complete Right  Result Date: 10/28/2020 CLINICAL DATA:  Fall EXAM: RIGHT HAND - COMPLETE 3+ VIEW COMPARISON:  12/31/2012 FINDINGS: Acute mildly displaced intra-articular fracture involving the base of the first metacarpal. No subluxation. Arthritis at the IP joints and first [facility] joint. IMPRESSION: Acute mildly displaced intra-articular fracture at the base of the first metacarpal Electronically Signed   By: Donavan Foil M.D.   On: 10/28/2020 22:36    Procedures .Ortho Injury Treatment  Date/Time: 10/28/2020 10:45 PM Performed by: Violet Baldy, MD Authorized by: Drenda Freeze, MD   Consent:    Consent obtained:  Verbal   Consent given by:  Patient   Risks discussed:  Fracture, nerve damage, restricted joint movement, vascular damage, stiffness, recurrent dislocation and irreducible dislocation   Alternatives discussed:  No treatment, delayed treatment, alternative treatment, immobilization and referralInjury location: finger Location details: left little finger Injury type: dislocation Dislocation type: PIP Pre-procedure neurovascular assessment: neurovascularly intact Pre-procedure distal perfusion: normal Pre-procedure neurological function: normal Pre-procedure range of motion: normal Anesthesia: local  infiltration  Anesthesia: Local anesthesia used: yes Local Anesthetic: lidocaine 2% without epinephrine Anesthetic total: 2 mL  Patient sedated: NoManipulation performed: yes Reduction successful: yes X-ray confirmed reduction: yes Immobilization: splint Splint type: ulnar gutter Splint Applied by: Sheliah Hatch Post-procedure neurovascular assessment: post-procedure neurovascularly intact Post-procedure distal perfusion: normal Post-procedure neurological function: normal Post-procedure range of motion: normal     Medications Ordered in ED Medications  aspirin EC tablet 81 mg (has no administration in time range)  furosemide (LASIX) tablet 40 mg (has no administration in time range)  metoprolol tartrate (LOPRESSOR) tablet 25 mg (has no administration in time range)  pravastatin (PRAVACHOL) tablet 40 mg (has no administration in time range)  loperamide (IMODIUM) capsule 4 mg (has no administration in time range)  acetaminophen (TYLENOL) tablet 650 mg (650 mg Oral Given 10/29/20 0126)    Or  acetaminophen (TYLENOL) suppository 650 mg ( Rectal See Alternative 10/29/20 0126)  morphine 4 MG/ML injection 4 mg (4 mg Intravenous Given 10/28/20 2141)  lidocaine (XYLOCAINE) 2 % (with pres) injection 200 mg (200 mg Infiltration Given by Other 10/28/20 2324)    ED Course  I have reviewed the triage vital signs and the nursing notes.  Pertinent labs & imaging results that were available during my care of the patient were reviewed by me and considered in my medical decision making (see chart for details).    MDM Rules/Calculators/A&P  80 y.o. male with past medical history as above who presents for evaluation of syncope, as well as injuries sustained in a resultant fall. Afebrile and hemodynamically stable.  Primary and secondary survey performed.  Exam as detailed above.  CBC is unremarkable.  CMP with creatinine of 1.3, consistent with baseline 1.2.  Troponin is  normal.  EKG is NSR without evidence of ischemia or arrhythmia.  Focused trauma scans were performed.  I have low suspicion for acute traumatic intrathoracic or intra-abdominal pathology warranting pan scans.  CT head without acute intracranial abnormality.  CT cervical spine without acute fracture or malalignment.  Chest x-ray without acute cardiopulmonary abnormality.  Pelvis x-ray without evidence of fracture.  Plain film of the right hand demonstrates fracture at the base of the first metacarpal.  Plain film of the left hand demonstrates an acute fracture involving the base of the fifth metacarpal.  Wrist x-ray was negative.  Knee x-ray was negative.  Fracture reduction and splinting was performed at bedside by Dr. Shirlyn Goltz.  Due to high risk syncope, patient will require hospital admission.  Hospitalist agreed to admit patient for further management.  Final Clinical Impression(s) / ED Diagnoses Final diagnoses:  None    Rx / DC Orders ED Discharge Orders     None        Violet Baldy, MD 10/29/20 1354    Drenda Freeze, MD 10/30/20 1450

## 2020-10-28 NOTE — ED Notes (Signed)
Patient transported to X-ray 

## 2020-10-28 NOTE — ED Notes (Signed)
Patient transported to CT 

## 2020-10-28 NOTE — ED Triage Notes (Signed)
Pt BIBA via [facility] due to fall. Per medic, patient walking to car, missed step, fell from standing and hit head. Pt presents with hematoma to left head, left pinky and wrist deformity with swelling noted. Patient also presents with abrasion to left knee. Pt endorses LOC. PTA patient received 55mg of fentanyl through IV started. Pt did refuse c-collar due to rod in his neck, but did get LUE in splint from medic. Vitals per medic, 150/80, HR in 80s, was 90% SpO2, placed on 2L upon arrival. Pt A&Ox4 at this time.

## 2020-10-29 ENCOUNTER — Encounter (HOSPITAL_COMMUNITY): Payer: Self-pay | Admitting: Internal Medicine

## 2020-10-29 ENCOUNTER — Observation Stay (HOSPITAL_COMMUNITY): Payer: Medicare Other

## 2020-10-29 DIAGNOSIS — S6291XA Unspecified fracture of right wrist and hand, initial encounter for closed fracture: Secondary | ICD-10-CM

## 2020-10-29 DIAGNOSIS — S6292XA Unspecified fracture of left wrist and hand, initial encounter for closed fracture: Secondary | ICD-10-CM

## 2020-10-29 DIAGNOSIS — S62317D Displaced fracture of base of fifth metacarpal bone. left hand, subsequent encounter for fracture with routine healing: Secondary | ICD-10-CM | POA: Diagnosis not present

## 2020-10-29 DIAGNOSIS — R55 Syncope and collapse: Secondary | ICD-10-CM | POA: Insufficient documentation

## 2020-10-29 DIAGNOSIS — Z9861 Coronary angioplasty status: Secondary | ICD-10-CM

## 2020-10-29 DIAGNOSIS — S060X1A Concussion with loss of consciousness of 30 minutes or less, initial encounter: Secondary | ICD-10-CM | POA: Diagnosis not present

## 2020-10-29 DIAGNOSIS — I2510 Atherosclerotic heart disease of native coronary artery without angina pectoris: Secondary | ICD-10-CM

## 2020-10-29 DIAGNOSIS — S060XAA Concussion with loss of consciousness status unknown, initial encounter: Secondary | ICD-10-CM

## 2020-10-29 DIAGNOSIS — E669 Obesity, unspecified: Secondary | ICD-10-CM

## 2020-10-29 DIAGNOSIS — K5130 Ulcerative (chronic) rectosigmoiditis without complications: Secondary | ICD-10-CM | POA: Diagnosis not present

## 2020-10-29 DIAGNOSIS — S060X9A Concussion with loss of consciousness of unspecified duration, initial encounter: Secondary | ICD-10-CM

## 2020-10-29 DIAGNOSIS — I5032 Chronic diastolic (congestive) heart failure: Secondary | ICD-10-CM

## 2020-10-29 LAB — BASIC METABOLIC PANEL
Anion gap: 8 (ref 5–15)
BUN: 15 mg/dL (ref 8–23)
CO2: 26 mmol/L (ref 22–32)
Calcium: 9.6 mg/dL (ref 8.9–10.3)
Chloride: 105 mmol/L (ref 98–111)
Creatinine, Ser: 1.31 mg/dL — ABNORMAL HIGH (ref 0.61–1.24)
GFR, Estimated: 54 mL/min — ABNORMAL LOW (ref >60)
Glucose, Bld: 161 mg/dL — ABNORMAL HIGH (ref 70–99)
Potassium: 4.5 mmol/L (ref 3.5–5.1)
Sodium: 139 mmol/L (ref 135–145)

## 2020-10-29 LAB — CBC
HCT: 43.5 % (ref 39.0–52.0)
Hemoglobin: 13.4 g/dL (ref 13.0–17.0)
MCH: 28.7 pg (ref 26.0–34.0)
MCHC: 30.8 g/dL (ref 30.0–36.0)
MCV: 93.1 fL (ref 80.0–100.0)
Platelets: 193 10*3/uL (ref 150–400)
RBC: 4.67 MIL/uL (ref 4.22–5.81)
RDW: 13.9 % (ref 11.5–15.5)
WBC: 11.7 10*3/uL — ABNORMAL HIGH (ref 4.0–10.5)
nRBC: 0 % (ref 0.0–0.2)

## 2020-10-29 LAB — SARS CORONAVIRUS 2 (TAT 6-24 HRS): SARS Coronavirus 2: NEGATIVE

## 2020-10-29 MED ORDER — FUROSEMIDE 40 MG PO TABS
40.0000 mg | ORAL_TABLET | Freq: Every day | ORAL | Status: DC
  Administered 2020-10-29 – 2020-10-30 (×2): 40 mg via ORAL
  Filled 2020-10-29: qty 2
  Filled 2020-10-29: qty 1

## 2020-10-29 MED ORDER — SERTRALINE HCL 100 MG PO TABS
200.0000 mg | ORAL_TABLET | Freq: Every day | ORAL | Status: DC
  Administered 2020-10-30: 200 mg via ORAL
  Filled 2020-10-29: qty 2

## 2020-10-29 MED ORDER — ALBUTEROL SULFATE (2.5 MG/3ML) 0.083% IN NEBU: 2.5000 mg | INHALATION_SOLUTION | Freq: Four times a day (QID) | RESPIRATORY_TRACT | Status: DC | PRN

## 2020-10-29 MED ORDER — PREDNISONE 10 MG PO TABS
10.0000 mg | ORAL_TABLET | Freq: Every day | ORAL | Status: DC
  Filled 2020-10-29: qty 1

## 2020-10-29 MED ORDER — CELECOXIB 100 MG PO CAPS: 100.0000 mg | ORAL_CAPSULE | Freq: Every day | ORAL | Status: DC

## 2020-10-29 MED ORDER — ACETAMINOPHEN 325 MG PO TABS
650.0000 mg | ORAL_TABLET | Freq: Four times a day (QID) | ORAL | Status: DC | PRN
  Administered 2020-10-29 – 2020-10-30 (×3): 650 mg via ORAL
  Filled 2020-10-29 (×3): qty 2

## 2020-10-29 MED ORDER — MESALAMINE 1.2 G PO TBEC
4.8000 g | DELAYED_RELEASE_TABLET | Freq: Every day | ORAL | Status: DC
  Administered 2020-10-30: 2.4 g via ORAL
  Filled 2020-10-29 (×2): qty 4

## 2020-10-29 MED ORDER — ASPIRIN EC 81 MG PO TBEC
81.0000 mg | DELAYED_RELEASE_TABLET | Freq: Every day | ORAL | Status: DC
  Administered 2020-10-29 – 2020-10-30 (×2): 81 mg via ORAL
  Filled 2020-10-29 (×2): qty 1

## 2020-10-29 MED ORDER — ADULT MULTIVITAMIN W/MINERALS CH
1.0000 | ORAL_TABLET | Freq: Every day | ORAL | Status: DC
  Administered 2020-10-30: 1 via ORAL
  Filled 2020-10-29: qty 1

## 2020-10-29 MED ORDER — TRAMADOL HCL 50 MG PO TABS
50.0000 mg | ORAL_TABLET | Freq: Four times a day (QID) | ORAL | Status: DC | PRN
  Administered 2020-10-29: 50 mg via ORAL
  Filled 2020-10-29: qty 1

## 2020-10-29 MED ORDER — HYDROMORPHONE HCL 1 MG/ML IJ SOLN
1.0000 mg | INTRAMUSCULAR | Status: DC | PRN
  Administered 2020-10-29: 1 mg via INTRAVENOUS
  Filled 2020-10-29: qty 1

## 2020-10-29 MED ORDER — ACETAMINOPHEN 650 MG RE SUPP: 650.0000 mg | Freq: Four times a day (QID) | RECTAL | Status: DC | PRN

## 2020-10-29 MED ORDER — MOMETASONE FURO-FORMOTEROL FUM 200-5 MCG/ACT IN AERO
2.0000 | INHALATION_SPRAY | Freq: Two times a day (BID) | RESPIRATORY_TRACT | Status: DC
  Administered 2020-10-30: 2 via RESPIRATORY_TRACT
  Filled 2020-10-29: qty 8.8

## 2020-10-29 MED ORDER — DOXAZOSIN MESYLATE 4 MG PO TABS: 4.0000 mg | ORAL_TABLET | Freq: Every day | ORAL | Status: DC

## 2020-10-29 MED ORDER — ALBUTEROL SULFATE HFA 108 (90 BASE) MCG/ACT IN AERS: 2.0000 | INHALATION_SPRAY | Freq: Four times a day (QID) | RESPIRATORY_TRACT | Status: DC | PRN

## 2020-10-29 MED ORDER — TRAMADOL HCL 50 MG PO TABS: 50.0000 mg | ORAL_TABLET | Freq: Four times a day (QID) | ORAL | Status: DC | PRN

## 2020-10-29 MED ORDER — CLONAZEPAM 0.5 MG PO TABS: 0.5000 mg | ORAL_TABLET | Freq: Every day | ORAL | Status: DC

## 2020-10-29 MED ORDER — LOPERAMIDE HCL 2 MG PO CAPS
4.0000 mg | ORAL_CAPSULE | Freq: Every day | ORAL | Status: DC
  Administered 2020-10-29 (×2): 4 mg via ORAL
  Filled 2020-10-29 (×2): qty 2

## 2020-10-29 MED ORDER — FERROUS SULFATE 325 (65 FE) MG PO TABS
325.0000 mg | ORAL_TABLET | Freq: Every day | ORAL | Status: DC
  Administered 2020-10-30: 325 mg via ORAL
  Filled 2020-10-29: qty 1

## 2020-10-29 MED ORDER — NITROGLYCERIN 0.4 MG SL SUBL: 0.4000 mg | SUBLINGUAL_TABLET | SUBLINGUAL | Status: DC | PRN

## 2020-10-29 MED ORDER — MOMETASONE FURO-FORMOTEROL FUM 200-5 MCG/ACT IN AERO: 2.0000 | INHALATION_SPRAY | Freq: Two times a day (BID) | RESPIRATORY_TRACT | Status: DC

## 2020-10-29 MED ORDER — PRAVASTATIN SODIUM 40 MG PO TABS
40.0000 mg | ORAL_TABLET | Freq: Every day | ORAL | Status: DC
  Administered 2020-10-29 (×2): 40 mg via ORAL
  Filled 2020-10-29 (×2): qty 1

## 2020-10-29 MED ORDER — PANTOPRAZOLE SODIUM 40 MG PO TBEC: 40.0000 mg | DELAYED_RELEASE_TABLET | Freq: Every day | ORAL | Status: DC

## 2020-10-29 MED ORDER — METOPROLOL TARTRATE 25 MG PO TABS
25.0000 mg | ORAL_TABLET | Freq: Every day | ORAL | Status: DC
  Administered 2020-10-29 – 2020-10-30 (×2): 25 mg via ORAL
  Filled 2020-10-29 (×2): qty 1

## 2020-10-29 MED ORDER — MICONAZOLE NITRATE 2 % EX CREA
TOPICAL_CREAM | Freq: Two times a day (BID) | CUTANEOUS | Status: DC
  Filled 2020-10-29 (×2): qty 28.4

## 2020-10-29 MED ORDER — OXYCODONE HCL 5 MG PO TABS
5.0000 mg | ORAL_TABLET | ORAL | Status: DC | PRN
Start: 2020-10-29 — End: 2020-10-29
  Administered 2020-10-29: 5 mg via ORAL
  Filled 2020-10-29: qty 1

## 2020-10-29 MED ORDER — GUAIFENESIN ER 600 MG PO TB12
600.0000 mg | ORAL_TABLET | Freq: Two times a day (BID) | ORAL | Status: DC
  Filled 2020-10-29 (×2): qty 1

## 2020-10-29 MED ORDER — VITAMIN D (ERGOCALCIFEROL) 1.25 MG (50000 UNIT) PO CAPS
50000.0000 [IU] | ORAL_CAPSULE | ORAL | Status: DC
  Filled 2020-10-29: qty 1

## 2020-10-29 NOTE — H&P (Addendum)
History and Physical    [name] WCB:[id number] DOB: [birth year] DOA: 10/28/2020  PCP: [name], MD  Patient coming from: Home.  Chief Complaint: Fall and loss of consciousness.  HPI: [name] is a 80 y.o. male with history of CAD status post stenting, diastolic CHF, COPD, sleep apnea, ulcerative colitis was brought to the ER after patient had a fall.  As per patient's daughter patient and patient's daughter was returning back after going to a restaurant when patient turned and suddenly fell onto the floor hit his head and lost consciousness.  Patient appeared confused for few minutes after this incident.  He does not recall EMS coming.  Did not have any chest pain or shortness of breath or palpitation prior or after the incident.  Patient daughter confirms that he lost consciousness only after he hit his head.  ED Course: In the ER CT head and C-spine show scalp hematoma otherwise unremarkable.  X-rays were done which shows fractures of the left fifth metacarpal and the right first metacarpal.  For which splinting is very done.  Patient also had a scalp hematoma with some skin abrasion.  Given that patient has history of CAD and history of CHF patient admitted for further observation given the syncopal episode.  Labs are largely unremarkable.  COVID test is pending.  Review of Systems: As per HPI, rest all negative.   Past Medical History:  Diagnosis Date   Ankylosing spondylitis ([place]) 11/07/2018   Asthma    BPH (benign prostatic hyperplasia)    CAD (coronary artery disease) 01/1995   CHF (congestive heart failure) ([name])    Clostridium difficile colitis 04/2005   Colitis 2011   COPD (chronic obstructive pulmonary disease) ([name])    Depression    Diverticulosis    DJD (degenerative joint disease)    Gastric ulcer 04/17/10   Three 32m gastric ulcers, H.pylori serologies were negative   GERD (gastroesophageal reflux disease)    History of kidney stones     Hyperlipidemia    Hypertension    Idiopathic chronic inflammatory bowel disease 05/18/2010   left-sided UC   Kidney stone    Morbid obesity ([place] 03/12/2018   Obstructive sleep apnea    on Cpap   Reflux 02/1995   S/P endoscopy 07/24/10   retained gastric contents, benign bx    Past Surgical History:  Procedure Laterality Date   ANORECTAL MANOMETRY  2016   [facility]: concern for possible fissure. Noted pelvic floor dyssnyergy   BIOPSY  07/29/2017   Procedure: BIOPSY;  Surgeon: RDaneil Dolin MD;  [facility];  Service: Endoscopy;;  ascending and sigmoid colon   CARDIAC CATHETERIZATION     with stent   CARDIOVASCULAR STRESS TEST  07/21/2009   No scintigraphic evidence of inducible myocardial ischemia   CARPAL TUNNEL RELEASE Left 01/17/2017   Procedure: LEFT CARPAL TUNNEL RELEASE;  Surgeon: HCarole Civil MD;  Location: [facility];  Service: Orthopedics;  Laterality: Left;   CATARACT EXTRACTION, BILATERAL Bilateral    CERVICAL SPINE SURGERY     C4-5   COLONOSCOPY  04/2005   granularity and friability erosions from rectum to 40cm. Bx infection vs IBD. C. Diff positive at the time.    COLONOSCOPY  05/2010   [facility]: left-sided UC, bx with no dysplasia, shallow diverticula   COLONOSCOPY [name] 11/07/2012   ROHY:WVPX-TGGYIproctocolitis status post segmental biopsy/Sigmoid colon polyps removed as described above. Procedure compromisd by technical difficulties. bx: Inflammation limited to sigmoid and  rectum on pathology.   COLONOSCOPY  04/2014   Dr. Nyoka Cowden at [facility]: well localized proctocolitis limited to sigmoid   COLONOSCOPY WITH PROPOFOL [name] 07/29/2017   diverticulosis in colon, three 4-6 mm polyps at splenic flexure and in cecum, one 10 mm polyp in rectum, abnormal rectum and sigmoid consistent with active UC   CORONARY STENT PLACEMENT  01/1995   ESOPHAGOGASTRODUODENOSCOPY  07/24/2010   IRJ:JOACZY esophagus   ESOPHAGOGASTRODUODENOSCOPY  04/2014   Dr. Nyoka Cowden at [facility]: negative  small bowel biopsies   FOOT SURGERY Bilateral two   KNEE SURGERY  two   POLYPECTOMY  07/29/2017   Procedure: POLYPECTOMY;  Surgeon: Daneil Dolin, MD;  [facility];  Service: Endoscopy;;  splenic flexure, ascending colon polyp;rectal   SHOULDER SURGERY  two   TONSILLECTOMY     TRANSTHORACIC ECHOCARDIOGRAM  03/23/2009   EF 60-65%, normal LV systolic function   ULNAR HEAD EXCISION Left 01/17/2017   Procedure: LEFT ULNAR HEAD RESECTION;  Surgeon: Carole Civil, MD;  Location: [facility];  Service: Orthopedics;  Laterality: Left;     reports that he quit smoking about 51 years ago. His smoking use included cigarettes. He has a 20.00 pack-year smoking history. He has never used smokeless tobacco. He reports that he does not drink alcohol and does not use drugs.  No Known Allergies  Family History  Problem Relation Age of Onset   Lung cancer Mother    Cancer Mother        breast   Diabetes Mother    Stroke Father    Hypertension Father    Kidney failure Brother    Other Child        blood infection   Colon cancer Neg Hx     Prior to Admission medications   Medication Sig Start Date End Date Taking? Authorizing Provider  acetaminophen (TYLENOL) 325 MG tablet     [provider]  albuterol (PROVENTIL) (2.5 MG/3ML) 0.083% nebulizer solution Take 3 mLs (2.5 mg total) by nebulization every 6 (six) hours as needed for wheezing or shortness of breath. 08/18/19   Kathie Dike, MD  albuterol (VENTOLIN HFA) 108 (90 Base) MCG/ACT inhaler Inhale 2 puffs into the lungs every 6 (six) hours as needed for wheezing or shortness of breath. 08/25/20   Nilda Simmer, NP  aspirin (BAYER LOW DOSE) 81 MG EC tablet Take by mouth.    [provider]  benzonatate (TESSALON) 200 MG capsule 1 capsule    [provider]  budesonide-formoterol (SYMBICORT) 160-4.5 MCG/ACT inhaler Inhale 2 puffs into the lungs 2 (two) times daily. 06/23/20   [name], MD   celecoxib (CELEBREX) 100 MG capsule Take 100 mg by mouth daily. 04/05/20   [provider]  clonazePAM (KLONOPIN) 0.5 MG tablet     [provider]  doxazosin (CARDURA) 4 MG tablet 1 tablet    [provider]  ferrous sulfate 325 (65 FE) MG tablet Take 1 tablet by mouth daily with breakfast. 08/18/14   [provider]  furosemide (LASIX) 40 MG tablet ONE TO ONE AND A HALF TABLET EACH MORNING AS NEEDED 06/23/20   [name], MD  guaiFENesin (MUCINEX) 600 MG 12 hr tablet     [provider]  ketoconazole (NIZORAL) 2 % cream APPLY TOPICALLY TWICE DAILY. MIX WITH BARRIER CREAM SUCH AS DESITIN 10/23/20   [name], MD  loperamide (IMODIUM) 2 MG capsule     [provider]  mesalamine (  LIALDA) 1.2 g EC tablet Take 4.8 g by mouth daily. 03/28/20   [provider]  metoprolol tartrate (LOPRESSOR) 50 MG tablet TAKE 1/2 TABLET BY MOUTH EVERY EVENING 06/27/20   [name], MD  Multiple Vitamin (MULTIVITAMIN WITH MINERALS) TABS tablet Take 1 tablet by mouth daily.    [provider]  mupirocin ointment (BACTROBAN) 2 % Apply BID to raw area 12/09/19   Luking, Elayne Snare, MD  nitroGLYCERIN (NITROSTAT) 0.4 MG SL tablet ONE TABLET UNDER TONGUE AS NEEDED FOR CHEST PAIN EVERY 5 MINUTES AS DIRECTED 10/14/20   Lorretta Harp, MD  pantoprazole (PROTONIX) 40 MG tablet 1 tablet 01/12/20   [provider]  pravastatin (PRAVACHOL) 40 MG tablet Take 1 tablet (40 mg total) by mouth at bedtime. 12/07/19   [name], MD  predniSONE (DELTASONE) 10 MG tablet Take by mouth. 04/14/20   [provider]  sertraline (ZOLOFT) 100 MG tablet TAKE 2 TABLETS BY MOUTH DAILY 06/23/20   [name], MD  traMADol (ULTRAM) 50 MG tablet Take 50 mg by mouth every 6 (six) hours as needed. 01/28/20   [provider]  Vitamin D, Ergocalciferol, (DRISDOL) 1.25 MG (50000 UNIT) CAPS capsule Take 1 capsule (50,000 Units total) by mouth every  7 (seven) days. 09/23/20   [name], MD    Physical Exam: Constitutional: Moderately built and nourished. Vitals:   10/28/20 2130 10/28/20 2253 10/28/20 2254 10/28/20 2255  BP: (!) 196/71 (!) 163/144 (!) 169/89   Pulse: 77 79 79   Resp: 16 15 15 14   Temp:      TempSrc:      SpO2: 94% 99% 98% 98%  Weight:      Height:       Eyes: Anicteric no pallor. ENMT: Scalp hematoma. Neck: No neck rigidity. Respiratory: No rhonchi or crepitations. Cardiovascular: S1-S2 heard. Abdomen: Soft nontender bowel sound present. Musculoskeletal: Both upper extremities are in splint. Skin: Scalp hematoma. Neurologic: Alert awake oriented to time place and person.  Moves all extremities. Psychiatric: Appears normal.  Normal affect.   Labs on Admission: I have personally reviewed following labs and imaging studies  CBC: Recent Labs  Lab 10/28/20 2131 10/28/20 2213  WBC 10.1  --   NEUTROABS 5.9  --   HGB 14.3 14.6  HCT 44.8 43.0  MCV 89.6  --   PLT 207  --    Basic Metabolic Panel: Recent Labs  Lab 10/28/20 2131 10/28/20 2213  NA 141 143  K 4.0 3.9  CL 106 104  CO2 27  --   GLUCOSE 147* 147*  BUN 15 18  CREATININE 1.36* 1.30*  CALCIUM 9.9  --    GFR: Estimated Creatinine Clearance: 62 mL/min (A) (by C-G formula based on SCr of 1.3 mg/dL (H)). Liver Function Tests: Recent Labs  Lab 10/28/20 2131  AST 29  ALT 17  ALKPHOS 83  BILITOT 0.5  PROT 7.1  ALBUMIN 3.4*   No results for input(s): LIPASE, AMYLASE in the last 168 hours. No results for input(s): AMMONIA in the last 168 hours. Coagulation Profile: No results for input(s): INR, PROTIME in the last 168 hours. Cardiac Enzymes: No results for input(s): CKTOTAL, CKMB, CKMBINDEX, TROPONINI in the last 168 hours. BNP (last 3 results) No results for input(s): PROBNP in the last 8760 hours. HbA1C: No results for input(s): HGBA1C in the last 72 hours. CBG: No results for input(s): GLUCAP in the last 168  hours. Lipid Profile: No results  for input(s): CHOL, HDL, LDLCALC, TRIG, CHOLHDL, LDLDIRECT in the last 72 hours. Thyroid Function Tests: No results for input(s): TSH, T4TOTAL, FREET4, T3FREE, THYROIDAB in the last 72 hours. Anemia Panel: No results for input(s): VITAMINB12, FOLATE, FERRITIN, TIBC, IRON, RETICCTPCT in the last 72 hours. Urine analysis:    Component Value Date/Time   COLORURINE YELLOW 08/16/2019 2149   APPEARANCEUR CLEAR 08/16/2019 2149   LABSPEC 1.025 08/16/2019 2149   PHURINE 5.0 08/16/2019 2149   GLUCOSEU NEGATIVE 08/16/2019 2149   HGBUR NEGATIVE 08/16/2019 2149   BILIRUBINUR NEGATIVE 08/16/2019 2149   BILIRUBINUR ++ 11/18/2012 1319   KETONESUR NEGATIVE 08/16/2019 2149   PROTEINUR NEGATIVE 08/16/2019 2149   UROBILINOGEN 0.2 08/04/2014 1620   NITRITE NEGATIVE 08/16/2019 2149   LEUKOCYTESUR SMALL (A) 08/16/2019 2149   Sepsis Labs: @LABRCNTIP (procalcitonin:4,lacticidven:4) )No results found for this or any previous visit (from the past 240 hour(s)).   Radiological Exams on Admission: DG Forearm Left  Result Date: 10/28/2020 CLINICAL DATA:  Fall wrist deformity EXAM: LEFT FOREARM - 2 VIEW COMPARISON:  Wrist radiograph 01/17/2017 FINDINGS: no definite acute displaced fracture or malalignment. Chronic postsurgical and posttraumatic deformity at the distal ulna. Evidence of old distal radius fracture. IMPRESSION: No definite acute osseous abnormality Electronically Signed   By: Donavan Foil M.D.   On: 10/28/2020 22:32   DG Wrist Complete Left  Result Date: 10/28/2020 CLINICAL DATA:  Fall with deformity EXAM: LEFT WRIST - COMPLETE 3+ VIEW COMPARISON:  01/17/2017 FINDINGS: Acute comminuted intra-articular fracture at the base of fifth metacarpal with displacement. Chronic postsurgical and posttraumatic deformity of distal ulna. Chronic fracture deformity of the distal radius. No definite acute displaced fracture at the wrist. IMPRESSION: 1. Acute comminuted  intra-articular fracture at the base of the fifth metacarpal. 2. Chronic appearing deformities at the distal radius and ulna. Electronically Signed   By: Donavan Foil M.D.   On: 10/28/2020 22:41   DG Knee 2 Views Left  Result Date: 10/28/2020 CLINICAL DATA:  Fall EXAM: LEFT KNEE - 1-2 VIEW COMPARISON:  10/19/2009 FINDINGS: Knee replacement with intact hardware. No definite acute fracture. No sizeable effusion IMPRESSION: Knee replacement without definite acute osseous abnormality Electronically Signed   By: Donavan Foil M.D.   On: 10/28/2020 22:38   CT HEAD WO CONTRAST (5MM)  Result Date: 10/28/2020 CLINICAL DATA:  Status post fall. EXAM: CT HEAD WITHOUT CONTRAST CT CERVICAL SPINE WITHOUT CONTRAST TECHNIQUE: Multidetector CT imaging of the head and cervical spine was performed following the standard protocol without intravenous contrast. Multiplanar CT image reconstructions of the cervical spine were also generated. COMPARISON:  None. FINDINGS: CT HEAD FINDINGS BRAIN: BRAIN Cerebral ventricle sizes are concordant with the degree of cerebral volume loss. Patchy and confluent areas of decreased attenuation are noted throughout the deep and periventricular white matter of the cerebral hemispheres bilaterally, compatible with chronic microvascular ischemic disease. No evidence of large-territorial acute infarction. No parenchymal hemorrhage. No mass lesion. No extra-axial collection. No mass effect or midline shift. No hydrocephalus. Basilar cisterns are patent. Vascular: No hyperdense vessel. Atherosclerotic calcifications are present within the cavernous internal carotid arteries. Skull: No acute fracture or focal lesion. Sinuses/Orbits: Paranasal sinuses and mastoid air cells are clear. Bilateral lens replacement. Otherwise orbits are unremarkable. Other: Left frontal scalp 2.4 cm scalp hematoma formation. CT CERVICAL SPINE FINDINGS Alignment: Normal. Skull base and vertebrae: C4-C5 anterior surgical fusion  with hardware. Multilevel severe degenerative changes of the spine with multilevel severe osseous neural foraminal stenosis. No severe osseous central canal stenosis.  No acute fracture. No aggressive appearing focal osseous lesion or focal pathologic process. Soft tissues and spinal canal: No prevertebral fluid or swelling. No visible canal hematoma. Upper chest: Biapical pleural/pulmonary scarring. Likely trace left pleural effusion. Paraseptal emphysematous changes. Other: At least moderate atherosclerotic plaque of the carotid arteries within the neck. IMPRESSION: 1. No acute intracranial abnormality. 2. A 2.4 cm left frontal scalp hematoma. 3. No acute displaced fracture or traumatic listhesis of the cervical spine in a patient status post C4-C5 anterior fusion. 4. Multilevel severe degenerative changes of the spine with multilevel severe osseous neural foraminal stenosis. 5. Likely trace left pleural effusion. 6.  Emphysema (ICD10-J43.9). Electronically Signed   By: Iven Finn M.D.   On: 10/28/2020 23:07   CT Cervical Spine Wo Contrast  Result Date: 10/28/2020 CLINICAL DATA:  Status post fall. EXAM: CT HEAD WITHOUT CONTRAST CT CERVICAL SPINE WITHOUT CONTRAST TECHNIQUE: Multidetector CT imaging of the head and cervical spine was performed following the standard protocol without intravenous contrast. Multiplanar CT image reconstructions of the cervical spine were also generated. COMPARISON:  None. FINDINGS: CT HEAD FINDINGS BRAIN: BRAIN Cerebral ventricle sizes are concordant with the degree of cerebral volume loss. Patchy and confluent areas of decreased attenuation are noted throughout the deep and periventricular white matter of the cerebral hemispheres bilaterally, compatible with chronic microvascular ischemic disease. No evidence of large-territorial acute infarction. No parenchymal hemorrhage. No mass lesion. No extra-axial collection. No mass effect or midline shift. No hydrocephalus. Basilar  cisterns are patent. Vascular: No hyperdense vessel. Atherosclerotic calcifications are present within the cavernous internal carotid arteries. Skull: No acute fracture or focal lesion. Sinuses/Orbits: Paranasal sinuses and mastoid air cells are clear. Bilateral lens replacement. Otherwise orbits are unremarkable. Other: Left frontal scalp 2.4 cm scalp hematoma formation. CT CERVICAL SPINE FINDINGS Alignment: Normal. Skull base and vertebrae: C4-C5 anterior surgical fusion with hardware. Multilevel severe degenerative changes of the spine with multilevel severe osseous neural foraminal stenosis. No severe osseous central canal stenosis. No acute fracture. No aggressive appearing focal osseous lesion or focal pathologic process. Soft tissues and spinal canal: No prevertebral fluid or swelling. No visible canal hematoma. Upper chest: Biapical pleural/pulmonary scarring. Likely trace left pleural effusion. Paraseptal emphysematous changes. Other: At least moderate atherosclerotic plaque of the carotid arteries within the neck. IMPRESSION: 1. No acute intracranial abnormality. 2. A 2.4 cm left frontal scalp hematoma. 3. No acute displaced fracture or traumatic listhesis of the cervical spine in a patient status post C4-C5 anterior fusion. 4. Multilevel severe degenerative changes of the spine with multilevel severe osseous neural foraminal stenosis. 5. Likely trace left pleural effusion. 6.  Emphysema (ICD10-J43.9). Electronically Signed   By: Iven Finn M.D.   On: 10/28/2020 23:07   DG Pelvis Portable  Result Date: 10/28/2020 CLINICAL DATA:  Fall EXAM: PORTABLE PELVIS 1-2 VIEWS COMPARISON:  CT 01/07/2020 FINDINGS: SI joints are non widened. Pubic symphysis and rami appear intact. No fracture or dislocation. Degenerative changes of both hips IMPRESSION: No acute osseous abnormality Electronically Signed   By: Donavan Foil M.D.   On: 10/28/2020 22:39   DG Hand 2 View Left  Result Date: 10/29/2020 CLINICAL  DATA:  Postreduction. EXAM: LEFT HAND - 2 VIEW COMPARISON:  Pre reduction radiograph yesterday. FINDINGS: Proximal metacarpal fracture is unchanged in alignment or after application of splint material. Chronic changes with multifocal osteoarthritis and probable remote injury to the distal radius and ulna, not significantly changed. IMPRESSION: Unchanged alignment of proximal metacarpal fracture after application of splint  material. Electronically Signed   By: Keith Rake M.D.   On: 10/29/2020 00:35   DG Chest Port 1 View  Result Date: 10/28/2020 CLINICAL DATA:  Fall EXAM: PORTABLE CHEST 1 VIEW COMPARISON:  08/16/2018 FINDINGS: Low lung volumes. Cardiomegaly with central bronchovascular crowding. Possible small left-sided effusion. Aortic atherosclerosis. No visible pneumothorax. Emphysema IMPRESSION: 1. Low lung volumes. 2. Cardiomegaly with bronchovascular crowding. Possible small left effusion Electronically Signed   By: Donavan Foil M.D.   On: 10/28/2020 22:30   DG Hand Complete Left  Result Date: 10/28/2020 CLINICAL DATA:  Fall EXAM: LEFT HAND - COMPLETE 3+ VIEW COMPARISON:  None. FINDINGS: Acute mildly comminuted fracture involving the base of the fifth metacarpal with about 1/2 shaft diameter dorsal displacement of distal fracture fragment, probable articular surface extension to the [facility] joint. No subluxation. Joint space narrowing and arthritis at the MCP, D IP and PIP joints. IMPRESSION: Acute mildly comminuted, likely intra-articular fracture involving the base of fifth metacarpal with mild displacement Electronically Signed   By: Donavan Foil M.D.   On: 10/28/2020 22:34   DG Hand Complete Right  Result Date: 10/28/2020 CLINICAL DATA:  Fall EXAM: RIGHT HAND - COMPLETE 3+ VIEW COMPARISON:  12/31/2012 FINDINGS: Acute mildly displaced intra-articular fracture involving the base of the first metacarpal. No subluxation. Arthritis at the IP joints and first [facility] joint. IMPRESSION: Acute mildly  displaced intra-articular fracture at the base of the first metacarpal Electronically Signed   By: Donavan Foil M.D.   On: 10/28/2020 22:36    EKG: Independently reviewed.  Normal sinus rhythm.  QTC of 454 ms.  Assessment/Plan Principal Problem:   Syncope Active Problems:   Obstructive sleep apnea   CAD S/P percutaneous coronary angioplasty   Chronic diastolic CHF (congestive heart failure) ([name])   Chronic ulcerative proctosigmoiditis, without complications ([name])    Syncope -postconcussive.  Patient lost consciousness only after hitting his head.  We will continue to monitor in telemetry and get physical therapy consult. Fractures of both upper extremities involving the left hand fifth metacarpal and right hand first metacarpal for which splinting has been done.  Will need referral to hand surgeon. CAD status post tenting denies any chest pain. Chronic diastolic CHF appears compensated.  Chest x-ray does show mild pleural fluid but patient is not short of breath.  We will continue patient's Lasix. History of hypertension presently only on metoprolol. Ulcerative colitis we will continue mesalamine after confirming the dose. Obstructive sleep apnea on CPAP at bedtime. COPD not actively wheezing. Chronic kidney disease stage II creatinine appears to be at baseline  COVID test pending.   DVT prophylaxis: SCDs for now.  If patient's cath hematoma is not worsening we will place patient on heparin or Lovenox. Code Status: Full code. Family Communication: Patient's daughter. Disposition Plan: Home. Consults called: Physical therapy. Admission status: Observation.   Rise Patience MD [facility] Pager [phone].  If 7PM-7AM, please contact night-coverage [email or web address] Password [facility]  10/29/2020, 12:40 AM

## 2020-10-29 NOTE — Plan of Care (Signed)

## 2020-10-29 NOTE — Progress Notes (Signed)
Orthopedic Tech Progress Note Patient Details:  [name] [birth year] [id number]  Ortho Devices Type of Ortho Device: Ulna gutter splint, Thumb spica splint Splint Material: Fiberglass Ortho Device/Splint Location: rue thumb spica. lue ulna gutter applied post reduction with drs assist. Ortho Device/Splint Interventions: Ordered, Application   Post Interventions Patient Tolerated: Well Instructions Provided: Care of device, Adjustment of device  Karolee Stamps 10/29/2020, 12:45 AM

## 2020-10-29 NOTE — ED Notes (Signed)
Patient had a small liquid bowel movement and primo fit leaked into linen. Patient cleaned, linen changed, primo fit replaced.

## 2020-10-29 NOTE — Progress Notes (Signed)
PROGRESS NOTE    [name]  VOZ:[id number] DOB: 06/05/1940 DOA: 10/28/2020 PCP: [name], MD    Brief Narrative:  Mr. Harkin was admitted to the hospital with the working diagnosis of head concussion with loss of consciousness and bilateral hand fractures.   80 year old male past medical history for coronary artery disease, diastolic heart failure, COPD, obstructive sleep apnea and ulcerative colitis who presented with a syncope episode.  Apparently patient return suddenly on his feet and fell onto the floor, he hit his head and then lost his consciousness.  When he recovered his consciousness he was confused for a few minutes.  No prodromal symptoms.  On his initial physical examination blood pressure 196/71, heart rate 77, respiratory rate 16, oxygen saturation 98%, his lungs are clear to auscultation bilaterally, heart S1-S2, present, rhythmic, abdomen soft, not extremity edema, patient was awake and alert, neurologically nonfocal.  Sodium 143, potassium 3.9, chloride 104, glucose 147, BUN 18, creatinine 1.30, white count 10.1, hemoglobin 14.3, hematocrit 44.8, platelets 207. SARS COVID-19 negative.  Head CT showed no acute intracranial abnormalities.  2.4 cm left frontal scalp hematoma.  No acute changes in cervical spine.  C4-C5 anterior fusion.  Left hand radiograph with acute mildly comminuted, likely intra-articular fracture involving the base of the fifth metacarpal with mild displacement. Right hand radiograph with acute mildly displaced intra-articular fracture at the base of the first metacarpal.   EKG 81 bpm, left axis deviation, first-degree AV block, sinus rhythm, no significant ST segment changes, lead I-aVL T wave version.  Assessment & Plan:   Principal Problem:   Head concussion Active Problems:   Obstructive sleep apnea   CAD S/P percutaneous coronary angioplasty   Essential hypertension   Chronic diastolic CHF (congestive heart failure) ([name])    COPD (chronic obstructive pulmonary disease) ([name])   Chronic ulcerative proctosigmoiditis, without complications ([name])   Class 3 obesity   Bilateral hand fractures   Head concussion, left frontal with positive loss of consciousness.  Bilateral hand fracture. Patient continue to have headache, no nausea or vomiting.   Continue neuro checks per unit protocol  Pain control with acetaminophen, oral oxycodone and IV morphine.  Follow with Pt and Ot.  Mobility is limited due to bilateral hand fracture. Patient lived with his daughter.   2. Chronic diastolic heart failure, HTN. Continue furosemide 40 mg to keep negative fluid balance and metoprolol for blood pressure control. No clinical signs of significant heart failure decompensation.  Continue with metoprolol.   3. Right goring yeast infection. Continue topical antifungal therapy.   4. COPD. No clinical signs of exacerbation. Continue with bronchodilator therapy and inhaled corticosteroids.  Continue home dose of prednisone 10 mg daily.   5. Chronic ulcerative proctosigmoiditis. No exacerbation.  Continue with mesalamine.   6. Obesity class 3/ dyslipidemia. Calculated BMI is 40,04.  Continue with pravastatin.   7. Depression and anxiety. Continue with clonazepam, setraline   8. Iron deficiency anemia. Continue with iron supplementation.   9. CKD stage 3a. Renal function with serum cr at 1,3 with K at 4,5 and serum bicarbonate ata 26, Avoid nephrotoxic medications.   Patient continue to be at high risk for recurrent falls.   Status is: Observation  The patient remains OBS appropriate and will d/c before 2 midnights.  Dispo: The patient is from: Home              Anticipated d/c is to: Home  Patient currently is not medically stable to d/c.   Difficult to place patient No   DVT prophylaxis: Enoxaparin   Code Status:    DNR Family Communication:  I spoke with patient's daughers at the bedside, we talked in  detail about patient's condition, plan of care and prognosis and all questions were addressed.     Subjective: Patient with no nausea or vomiting, continue to have persistent headache left frontal with mild improvement with analgesics.   Objective: Vitals:   10/29/20 1030 10/29/20 1100 10/29/20 1110 10/29/20 1208  BP: (!) 178/78 (!) 173/86 (!) 168/71 (!) 186/68  Pulse: 62 (!) 54 62 61  Resp: 19 19 19 20   Temp: 98.3 F (36.8 C)  98 F (36.7 C)   TempSrc:      SpO2: 96% 93% 94% 100%  Weight:      Height:        Intake/Output Summary (Last 24 hours) at 10/29/2020 1314 Last data filed at 10/29/2020 1000 Gross per 24 hour  Intake --  Output 600 ml  Net -600 ml   Filed Weights   10/28/20 2114  Weight: 133.9 kg    Examination:   General: Not in pain or dyspnea. Deconditioned  Neurology: Awake and alert, non focal  E ENT: mild pallor, no icterus, oral mucosa moist Cardiovascular: No JVD. S1-S2 present, rhythmic, no gallops, rubs, or murmurs. Trace bilateral lower extremity edema. Pulmonary: positive breath sounds bilaterally, adequate air movement, no wheezing, rhonchi or rales. Gastrointestinal. Abdomen soft and non tender Skin. No rashes Musculoskeletal: no joint deformities     Data Reviewed: I have personally reviewed following labs and imaging studies  CBC: Recent Labs  Lab 10/28/20 2131 10/28/20 2213 10/29/20 0107  WBC 10.1  --  11.7*  NEUTROABS 5.9  --   --   HGB 14.3 14.6 13.4  HCT 44.8 43.0 43.5  MCV 89.6  --  93.1  PLT 207  --  397   Basic Metabolic Panel: Recent Labs  Lab 10/28/20 2131 10/28/20 2213 10/29/20 0107  NA 141 143 139  K 4.0 3.9 4.5  CL 106 104 105  CO2 27  --  26  GLUCOSE 147* 147* 161*  BUN 15 18 15   CREATININE 1.36* 1.30* 1.31*  CALCIUM 9.9  --  9.6   GFR: Estimated Creatinine Clearance: 61.6 mL/min (A) (by C-G formula based on SCr of 1.31 mg/dL (H)). Liver Function Tests: Recent Labs  Lab 10/28/20 2131  AST 29  ALT  17  ALKPHOS 83  BILITOT 0.5  PROT 7.1  ALBUMIN 3.4*   No results for input(s): LIPASE, AMYLASE in the last 168 hours. No results for input(s): AMMONIA in the last 168 hours. Coagulation Profile: No results for input(s): INR, PROTIME in the last 168 hours. Cardiac Enzymes: No results for input(s): CKTOTAL, CKMB, CKMBINDEX, TROPONINI in the last 168 hours. BNP (last 3 results) No results for input(s): PROBNP in the last 8760 hours. HbA1C: No results for input(s): HGBA1C in the last 72 hours. CBG: No results for input(s): GLUCAP in the last 168 hours. Lipid Profile: No results for input(s): CHOL, HDL, LDLCALC, TRIG, CHOLHDL, LDLDIRECT in the last 72 hours. Thyroid Function Tests: No results for input(s): TSH, T4TOTAL, FREET4, T3FREE, THYROIDAB in the last 72 hours. Anemia Panel: No results for input(s): VITAMINB12, FOLATE, FERRITIN, TIBC, IRON, RETICCTPCT in the last 72 hours.    Radiology Studies: I have reviewed all of the imaging during this hospital visit personally  Scheduled Meds:  aspirin EC  81 mg Oral Daily   furosemide  40 mg Oral Daily   loperamide  4 mg Oral QHS   metoprolol tartrate  25 mg Oral Q1200   miconazole   Topical BID   pravastatin  40 mg Oral QHS   Continuous Infusions:   LOS: 0 days        Niamh Rada Gerome Apley, MD

## 2020-10-29 NOTE — Progress Notes (Signed)
PT Cancellation Note  Patient Details Name: [name] MRN: [id number] DOB: [birth year]   Cancelled Treatment:    Reason Eval/Treat Not Completed: Medical issues which prohibited therapy. Await consult for hand fractures.  Retry when medically cleared and directions for WB are given.   Ramond Dial 10/29/2020, 9:43 AM  Mee Hives, PT MS Acute Rehab Dept. Number: [place]

## 2020-10-29 NOTE — Progress Notes (Signed)
Patient unable to wear home cpap due to hematoma on head

## 2020-10-30 DIAGNOSIS — R531 Weakness: Secondary | ICD-10-CM | POA: Diagnosis not present

## 2020-10-30 DIAGNOSIS — G4733 Obstructive sleep apnea (adult) (pediatric): Secondary | ICD-10-CM | POA: Diagnosis not present

## 2020-10-30 DIAGNOSIS — M6281 Muscle weakness (generalized): Secondary | ICD-10-CM | POA: Diagnosis not present

## 2020-10-30 DIAGNOSIS — I5032 Chronic diastolic (congestive) heart failure: Secondary | ICD-10-CM | POA: Diagnosis not present

## 2020-10-30 DIAGNOSIS — K5130 Ulcerative (chronic) rectosigmoiditis without complications: Secondary | ICD-10-CM | POA: Diagnosis not present

## 2020-10-30 DIAGNOSIS — S6291XA Unspecified fracture of right wrist and hand, initial encounter for closed fracture: Secondary | ICD-10-CM | POA: Diagnosis not present

## 2020-10-30 DIAGNOSIS — S060X1A Concussion with loss of consciousness of 30 minutes or less, initial encounter: Secondary | ICD-10-CM | POA: Diagnosis not present

## 2020-10-30 LAB — BASIC METABOLIC PANEL
Anion gap: 7 (ref 5–15)
BUN: 13 mg/dL (ref 8–23)
CO2: 28 mmol/L (ref 22–32)
Calcium: 9.8 mg/dL (ref 8.9–10.3)
Chloride: 103 mmol/L (ref 98–111)
Creatinine, Ser: 1.07 mg/dL (ref 0.61–1.24)
GFR, Estimated: >60 mL/min (ref >60)
Glucose, Bld: 125 mg/dL — ABNORMAL HIGH (ref 70–99)
Potassium: 3.7 mmol/L (ref 3.5–5.1)
Sodium: 138 mmol/L (ref 135–145)

## 2020-10-30 MED ORDER — TRAMADOL HCL 50 MG PO TABS: 50.0000 mg | ORAL_TABLET | Freq: Four times a day (QID) | ORAL | 0 refills | Status: DC | PRN

## 2020-10-30 NOTE — Evaluation (Signed)
Occupational Therapy Evaluation Patient Details Name: [name] MRN: [id number] DOB: 1940/10/08 Today's Date: 10/30/2020   History of Present Illness Pt is an 80 y.o. male admitted 9/23 following a fall in the community. He sustained a head concussion, L 5th MC fx, and R 1st MC fx. PMH of CAD status post stenting, diastolic CHF, O2 dependent COPD, sleep apnea, and ulcerative colitis.   Clinical Impression   Pt seen with dtr in room to assist with PLOF and social hx; pt pleasant, limited significantly by discomfort and reports of pain this date. Noted there were no orders for NWB in chart, but OT completed session as though B Hands NWB. Pt currently requiring max A for ADLs in both sitting and standing, and mod A for self care transfers at this time. Dtr in room reports their preference if for d/c to home with [facility] therapy as family and PCA is available for 24hr S/A. OT reviewed modification and recommendations of equip to maximize Indep and safety with plan for return to home. OT will continue to follow acutely.      Recommendations for follow up therapy are one component of a multi-disciplinary discharge planning process, led by the attending physician.  Recommendations may be updated based on patient status, additional functional criteria and insurance authorization.   Follow Up Recommendations  Home health OT;Supervision/Assistance - 24 hour    Equipment Recommendations  3 in 1 bedside commode (bariatric, wheelchair)    Recommendations for Other Services       Precautions / Restrictions Precautions Precautions: Fall Required Braces or Orthoses: Splint/Cast Splint/Cast: bilat hand/wrist splints in place Splint/Cast - Date Prophylactic Dressing Applied (if applicable): 91/63/84 (in ED) Restrictions Weight Bearing Restrictions: Yes RUE Weight Bearing: Weight bear through elbow only LUE Weight Bearing: Weight bear through elbow only Other Position/Activity Restrictions: No WB  orders in chart. Maintained NWB bilat hands      Mobility Bed Mobility Overal bed mobility: Needs Assistance Bed Mobility: Supine to Sit     Supine to sit: Mod assist;HOB elevated     General bed mobility comments: cues for sequencing, assist to elevate trunk, use of bed pad to scoot to EOB    Transfers Overall transfer level: Needs assistance Equipment used: 1 person hand held assist Transfers: Sit to/from [employer] Transfers Sit to Stand: Mod assist Stand pivot transfers: Mod assist       General transfer comment: Mod A for initial transition to standing, improving on second trial to min, slight posterior LOB, able to sustain static standing at EOB with consintuous CGA-min A for safety.    Balance Overall balance assessment: Needs assistance Sitting-balance support: No upper extremity supported;Feet supported Sitting balance-Leahy Scale: Fair     Standing balance support: No upper extremity supported;During functional activity Standing balance-Leahy Scale: Poor Standing balance comment: reliant on external support                           ADL either performed or assessed with clinical judgement   ADL Overall ADL's : Needs assistance/impaired Eating/Feeding: Maximal assistance;Sitting   Grooming: Moderate assistance;Sitting           Upper Body Dressing : Moderate assistance   Lower Body Dressing: Maximal assistance   Toilet Transfer: Moderate assistance Toilet Transfer Details (indicate cue type and reason): slight posterior lean requiring cues for anterior weight shift Toileting- Clothing Manipulation and Hygiene: Sit to/from stand;Maximal assistance  General ADL Comments: baseline pt requiring intermitent assist for basic ADLs ranging from min-mod, but limited by hands/WB/pain pt requiring max A at this time.     Vision         Perception     Praxis      Pertinent Vitals/Pain Pain Assessment: 0-10 Pain Score: 4   Faces Pain Scale: Hurts little more Pain Location: headache Pain Descriptors / Indicators: Headache Pain Intervention(s): Limited activity within patient's tolerance     Hand Dominance Right   Extremity/Trunk Assessment Upper Extremity Assessment Upper Extremity Assessment: Generalized weakness (limitation in B shoulder flexion  (baseline), deferred further testing d/t reports of increased pain with repositioning and UE activity.)   Lower Extremity Assessment Lower Extremity Assessment: Defer to PT evaluation   Cervical / Trunk Assessment Cervical / Trunk Assessment: Normal   Communication Communication Communication: HOH   Cognition Arousal/Alertness: Awake/alert Behavior During Therapy: Flat affect;Anxious Overall Cognitive Status: History of cognitive impairments - at baseline                                 General Comments: overall cognition is appropriate for participation, single step commands, hard of hearing limiting some responses but dtr in room confirms social and PLOF   General Comments  SpO2 91% on 5L    Exercises     Shoulder Instructions      Home Living Family/patient expects to be discharged to:: Private residence Living Arrangements: Children Available Help at Discharge: Family;Personal care attendant;Available 24 hours/day Type of Home: House Home Access: Stairs to enter [employer] of Steps: 1 Entrance Stairs-Rails: Left Home Layout: One level     Bathroom Shower/Tub: Occupational psychologist: Standard     Home Equipment: Environmental consultant - 4 wheels;Walker - 2 wheels;Shower seat;Other (comment) (Lift chair)   Additional Comments: PCA comes M-F      Prior Functioning/Environment Level of Independence: Needs assistance    ADL's / Homemaking Assistance Needed: dtr or PCA provides assist wtih all ADLs            OT Problem List: Decreased strength;Decreased activity tolerance;Impaired balance (sitting and/or  standing);Decreased knowledge of use of DME or AE;Decreased knowledge of precautions;Decreased safety awareness      OT Treatment/Interventions: Self-care/ADL training;Therapeutic exercise;DME and/or AE instruction;Patient/family education;Balance training;Therapeutic activities    OT Goals(Current goals can be found in the care plan section) Acute Rehab OT Goals Patient Stated Goal: home OT Goal Formulation: With patient Time For Goal Achievement: 11/13/20 Potential to Achieve Goals: Fair ADL Goals Pt Will Perform Eating: with set-up;with adaptive utensils Pt Will Perform Grooming: with set-up;sitting Pt Will Transfer to Toilet: with supervision;stand pivot transfer Pt Will Perform Toileting - Clothing Manipulation and hygiene: with min assist;sit to/from stand  OT Frequency: Min 2X/week   Barriers to D/C:            Co-evaluation              AM-PAC OT "6 Clicks" Daily Activity     Outcome Measure Help from another person eating meals?: A Lot Help from another person taking care of personal grooming?: A Lot Help from another person toileting, which includes using toliet, bedpan, or urinal?: Total Help from another person bathing (including washing, rinsing, drying)?: Total Help from another person to put on and taking off regular upper body clothing?: A Lot Help from another person to put on and taking off regular  lower body clothing?: A Lot 6 Click Score: 10   End of Session Equipment Utilized During Treatment: Oxygen Nurse Communication: Mobility status  Activity Tolerance: Patient limited by fatigue;Patient limited by pain Patient left: in chair;with chair alarm set;with nursing/sitter in room (CNa in room completing bathing)  OT Visit Diagnosis: Unsteadiness on feet (R26.81);Muscle weakness (generalized) (M62.81)                Time: 1443-1540 OT Time Calculation (min): 26 min Charges:  OT General Charges $OT Visit: 1 Visit OT Evaluation $OT Eval Low  Complexity: 1 Low  Dominic Rhome OTR/L acute rehab services Office: [phone]  10/30/2020, 10:24 PM

## 2020-10-30 NOTE — Discharge Summary (Signed)
Physician Discharge Summary  [name] OFH:[id number] DOB: 07-28-1940 DOA: 10/28/2020  PCP: Chris Drown, MD  Admit date: 10/28/2020 Discharge date: 10/30/2020  Admitted From: Home  Disposition:  Home   Recommendations for Outpatient Follow-up and new medication changes:  Follow up with Dr. Wolfgang Underwood in 7 to 10 days.  Added tramadol 50 mg as needed for pain control.  I was not able to arrange a follow up appointment with orthopedics as outpatient. Information give to the family to make appointment.  Hold aspirin for now due to risk of fall and bleeding.   Home Health: yes  Equipment/Devices: wheelchair, bedside commode, lift chair.   Discharge Condition: sable  CODE STATUS: full  Diet recommendation:  heart healthy    Brief/Interim Summary: [name] was admitted to the hospital with the working diagnosis of head concussion with loss of consciousness and bilateral hand fractures.    80 year old male past medical history for coronary artery disease, diastolic heart failure, COPD, obstructive sleep apnea and ulcerative colitis who presented with a syncope episode.  Apparently patient turned suddenly on his feet and fell onto the floor, he hit his head and then lost his consciousness.  When he recovered his consciousness he was confused for a few minutes.  No prodromal symptoms.  On his initial physical examination blood pressure 196/71, heart rate 77, respiratory rate 16, oxygen saturation 98%, his lungs were clear to auscultation bilaterally, heart S1-S2, present, rhythmic, abdomen soft, not extremity edema, patient was awake and alert, neurologically nonfocal.   Sodium 143, potassium 3.9, chloride 104, glucose 147, BUN 18, creatinine 1.30, white count 10.1, hemoglobin 14.3, hematocrit 44.8, platelets 207. SARS COVID-19 negative.   Head CT showed no acute intracranial abnormalities.  2.4 cm left frontal scalp hematoma.  No acute changes in cervical spine.  C4-C5 anterior  fusion.   Left hand radiograph with acute mildly comminuted, likely intra-articular fracture involving the base of the fifth metacarpal with mild displacement. Right hand radiograph with acute mildly displaced intra-articular fracture at the base of the first metacarpal.    EKG 81 bpm, left axis deviation, first-degree AV block, sinus rhythm, no significant ST segment changes, lead I-aVL T wave version  Patient has frequent neuro checks and was evaluated by physical therapy. Pain control with tramadol.   Left frontal head concussion, positive loss of consciousness, bilateral hand fracture. Patient was admitted to the medical ward, he had frequent neurochecks, no nausea or vomiting.  His headache was controlled with tramadol and acetaminophen. He did receive intravenous hydromorphone as well.  At the time of discharge patient is feeling better, better pain control, he has been evaluated by physical therapy. Recommendations to continue physical therapy at home, he will need a wheelchair, bedside commode and lift chair.  For his hand fractures he had splints placed in the emergency department.  Unfortunately over the weekend I was not able to arrange an outpatient follow-up with orthopedics. Information will be given to the family to call for an appointment.  2.  Chronic diastolic heart failure.  Hypertension.  No signs of acute exacerbation, patient continue on metoprolol and furosemide.  3.  Right groin yeast infection.  Continue antifungal therapy.  4.  COPD.  No signs of acute exacerbation, continue bronchodilator therapy and inhaled corticosteroids.  5.  Chronic ulcerative proctosigmoiditis.  No acute flare, continue mesalamine per home regimen.  6.  Obesity class III.  Dyslipidemia.  His calculated BMI is 40.0, continue pravastatin.  7.  Depression and anxiety.  Continue clonazepam and sertraline.  8.  Iron deficiency anemia.  Continue iron supplementation.  9.  Chronic kidney  disease stage IIIa.  His kidney function remained stable, continue diuretic therapy at home.   Discharge Diagnoses:  Principal Problem:   Head concussion Active Problems:   Obstructive sleep apnea   CAD S/P percutaneous coronary angioplasty   Essential hypertension   Chronic diastolic CHF (congestive heart failure) ([name])   COPD (chronic obstructive pulmonary disease) ([name])   Chronic ulcerative proctosigmoiditis, without complications ([name])   Class 3 obesity   Bilateral hand fractures    Discharge Instructions   Allergies as of 10/30/2020   No Known Allergies      Medication List     STOP taking these medications    aspirin 81 MG EC tablet   budesonide-formoterol 160-4.5 MCG/ACT inhaler Commonly known as: SYMBICORT   clonazePAM 0.5 MG tablet Commonly known as: KLONOPIN   doxazosin 4 MG tablet Commonly known as: CARDURA   ferrous sulfate 325 (65 FE) MG tablet   guaiFENesin 600 MG 12 hr tablet Commonly known as: MUCINEX   pantoprazole 40 MG tablet Commonly known as: PROTONIX   pravastatin 40 MG tablet Commonly known as: PRAVACHOL   predniSONE 10 MG tablet Commonly known as: DELTASONE       TAKE these medications    acetaminophen 500 MG tablet Commonly known as: TYLENOL Take 1,000 mg by mouth every 6 (six) hours as needed for headache (pain).   albuterol 108 (90 Base) MCG/ACT inhaler Commonly known as: VENTOLIN HFA Inhale 2 puffs into the lungs every 6 (six) hours as needed for wheezing or shortness of breath.   ALIGN PO Take 1 tablet by mouth every morning.   celecoxib 100 MG capsule Commonly known as: CELEBREX Take 100 mg by mouth every morning.   furosemide 40 MG tablet Commonly known as: LASIX ONE TO ONE AND A HALF TABLET EACH MORNING AS NEEDED What changed:  how much to take how to take this when to take this additional instructions   ketoconazole 2 % cream Commonly known as: NIZORAL APPLY TOPICALLY TWICE DAILY. MIX WITH BARRIER  CREAM SUCH AS DESITIN What changed: See the new instructions.   loperamide 2 MG capsule Commonly known as: IMODIUM Take 2-4 mg by mouth See admin instructions. Take 2 capsules (4 mg) by mouth every morning and night, may also take one capsule (2 mg) during the day or night as needed for loose stools   mesalamine 1.2 g EC tablet Commonly known as: LIALDA Take 2.4 g by mouth 2 (two) times daily.   metoprolol tartrate 50 MG tablet Commonly known as: LOPRESSOR TAKE 1/2 TABLET BY MOUTH EVERY EVENING What changed: when to take this   multivitamin with minerals Tabs tablet Take 1 tablet by mouth every morning. Silver   mupirocin ointment 2 % Commonly known as: BACTROBAN Apply BID to raw area What changed:  how much to take how to take this when to take this reasons to take this additional instructions   nitroGLYCERIN 0.4 MG SL tablet Commonly known as: NITROSTAT ONE TABLET UNDER TONGUE AS NEEDED FOR CHEST PAIN EVERY 5 MINUTES AS DIRECTED What changed: See the new instructions.   nystatin cream Commonly known as: MYCOSTATIN Apply 1 application topically 2 (two) times daily as needed (rash/itching).   sertraline 100 MG tablet Commonly known as: ZOLOFT TAKE 2 TABLETS BY MOUTH DAILY What changed:  how much to take how to take this when to take this additional  instructions   traMADol 50 MG tablet Commonly known as: ULTRAM Take 1 tablet (50 mg total) by mouth every 6 (six) hours as needed for severe pain. What changed: reasons to take this   Vitamin D (Ergocalciferol) 1.25 MG (50000 UNIT) Caps capsule Commonly known as: DRISDOL Take 1 capsule (50,000 Units total) by mouth every 7 (seven) days. What changed: when to take this               Durable Medical Equipment  (From admission, onward)           Start     Ordered   10/30/20 0920  For home use only DME Other see comment  Once       Comments: Bariatric lift chair  Question:  Length of Need  Answer:  6  Months   10/30/20 0920   10/30/20 0919  For home use only DME Bedside commode  Once       Comments: Bariatric size.  Question:  Patient needs a bedside commode to treat with the following condition  Answer:  Bilateral hand fractures   10/30/20 0920   10/30/20 0918  For home use only DME standard manual wheelchair with seat cushion  Once       Comments: Patient suffers from bilateral hand fractures which impairs their ability to perform daily activities like bathing, feeding, and grooming in the home.  A walker will not resolve issue with performing activities of daily living. A wheelchair will allow patient to safely perform daily activities. Patient can safely propel the wheelchair in the home or has a caregiver who can provide assistance. Length of need 6 months . Accessories: elevating leg rests (ELRs), wheel locks, extensions and anti-tippers.  Bariatric size.   10/30/20 0920            No Known Allergies     Procedures/Studies: DG Forearm Left  Result Date: 10/28/2020 CLINICAL DATA:  Fall wrist deformity EXAM: LEFT FOREARM - 2 VIEW COMPARISON:  Wrist radiograph 01/17/2017 FINDINGS: no definite acute displaced fracture or malalignment. Chronic postsurgical and posttraumatic deformity at the distal ulna. Evidence of old distal radius fracture. IMPRESSION: No definite acute osseous abnormality Electronically Signed   By: Donavan Foil M.D.   On: 10/28/2020 22:32   DG Wrist Complete Left  Result Date: 10/28/2020 CLINICAL DATA:  Fall with deformity EXAM: LEFT WRIST - COMPLETE 3+ VIEW COMPARISON:  01/17/2017 FINDINGS: Acute comminuted intra-articular fracture at the base of fifth metacarpal with displacement. Chronic postsurgical and posttraumatic deformity of distal ulna. Chronic fracture deformity of the distal radius. No definite acute displaced fracture at the wrist. IMPRESSION: 1. Acute comminuted intra-articular fracture at the base of the fifth metacarpal. 2. Chronic appearing  deformities at the distal radius and ulna. Electronically Signed   By: Donavan Foil M.D.   On: 10/28/2020 22:41   DG Knee 2 Views Left  Result Date: 10/28/2020 CLINICAL DATA:  Fall EXAM: LEFT KNEE - 1-2 VIEW COMPARISON:  10/19/2009 FINDINGS: Knee replacement with intact hardware. No definite acute fracture. No sizeable effusion IMPRESSION: Knee replacement without definite acute osseous abnormality Electronically Signed   By: Donavan Foil M.D.   On: 10/28/2020 22:38   CT HEAD WO CONTRAST (5MM)  Result Date: 10/28/2020 CLINICAL DATA:  Status post fall. EXAM: CT HEAD WITHOUT CONTRAST CT CERVICAL SPINE WITHOUT CONTRAST TECHNIQUE: Multidetector CT imaging of the head and cervical spine was performed following the standard protocol without intravenous contrast. Multiplanar CT image reconstructions of the cervical  spine were also generated. COMPARISON:  None. FINDINGS: CT HEAD FINDINGS BRAIN: BRAIN Cerebral ventricle sizes are concordant with the degree of cerebral volume loss. Patchy and confluent areas of decreased attenuation are noted throughout the deep and periventricular white matter of the cerebral hemispheres bilaterally, compatible with chronic microvascular ischemic disease. No evidence of large-territorial acute infarction. No parenchymal hemorrhage. No mass lesion. No extra-axial collection. No mass effect or midline shift. No hydrocephalus. Basilar cisterns are patent. Vascular: No hyperdense vessel. Atherosclerotic calcifications are present within the cavernous internal carotid arteries. Skull: No acute fracture or focal lesion. Sinuses/Orbits: Paranasal sinuses and mastoid air cells are clear. Bilateral lens replacement. Otherwise orbits are unremarkable. Other: Left frontal scalp 2.4 cm scalp hematoma formation. CT CERVICAL SPINE FINDINGS Alignment: Normal. Skull base and vertebrae: C4-C5 anterior surgical fusion with hardware. Multilevel severe degenerative changes of the spine with multilevel  severe osseous neural foraminal stenosis. No severe osseous central canal stenosis. No acute fracture. No aggressive appearing focal osseous lesion or focal pathologic process. Soft tissues and spinal canal: No prevertebral fluid or swelling. No visible canal hematoma. Upper chest: Biapical pleural/pulmonary scarring. Likely trace left pleural effusion. Paraseptal emphysematous changes. Other: At least moderate atherosclerotic plaque of the carotid arteries within the neck. IMPRESSION: 1. No acute intracranial abnormality. 2. A 2.4 cm left frontal scalp hematoma. 3. No acute displaced fracture or traumatic listhesis of the cervical spine in a patient status post C4-C5 anterior fusion. 4. Multilevel severe degenerative changes of the spine with multilevel severe osseous neural foraminal stenosis. 5. Likely trace left pleural effusion. 6.  Emphysema (ICD10-J43.9). Electronically Signed   By: Iven Finn M.D.   On: 10/28/2020 23:07   CT Cervical Spine Wo Contrast  Result Date: 10/28/2020 CLINICAL DATA:  Status post fall. EXAM: CT HEAD WITHOUT CONTRAST CT CERVICAL SPINE WITHOUT CONTRAST TECHNIQUE: Multidetector CT imaging of the head and cervical spine was performed following the standard protocol without intravenous contrast. Multiplanar CT image reconstructions of the cervical spine were also generated. COMPARISON:  None. FINDINGS: CT HEAD FINDINGS BRAIN: BRAIN Cerebral ventricle sizes are concordant with the degree of cerebral volume loss. Patchy and confluent areas of decreased attenuation are noted throughout the deep and periventricular white matter of the cerebral hemispheres bilaterally, compatible with chronic microvascular ischemic disease. No evidence of large-territorial acute infarction. No parenchymal hemorrhage. No mass lesion. No extra-axial collection. No mass effect or midline shift. No hydrocephalus. Basilar cisterns are patent. Vascular: No hyperdense vessel. Atherosclerotic calcifications  are present within the cavernous internal carotid arteries. Skull: No acute fracture or focal lesion. Sinuses/Orbits: Paranasal sinuses and mastoid air cells are clear. Bilateral lens replacement. Otherwise orbits are unremarkable. Other: Left frontal scalp 2.4 cm scalp hematoma formation. CT CERVICAL SPINE FINDINGS Alignment: Normal. Skull base and vertebrae: C4-C5 anterior surgical fusion with hardware. Multilevel severe degenerative changes of the spine with multilevel severe osseous neural foraminal stenosis. No severe osseous central canal stenosis. No acute fracture. No aggressive appearing focal osseous lesion or focal pathologic process. Soft tissues and spinal canal: No prevertebral fluid or swelling. No visible canal hematoma. Upper chest: Biapical pleural/pulmonary scarring. Likely trace left pleural effusion. Paraseptal emphysematous changes. Other: At least moderate atherosclerotic plaque of the carotid arteries within the neck. IMPRESSION: 1. No acute intracranial abnormality. 2. A 2.4 cm left frontal scalp hematoma. 3. No acute displaced fracture or traumatic listhesis of the cervical spine in a patient status post C4-C5 anterior fusion. 4. Multilevel severe degenerative changes of the spine with multilevel  severe osseous neural foraminal stenosis. 5. Likely trace left pleural effusion. 6.  Emphysema (ICD10-J43.9). Electronically Signed   By: Iven Finn M.D.   On: 10/28/2020 23:07   DG Pelvis Portable  Result Date: 10/28/2020 CLINICAL DATA:  Fall EXAM: PORTABLE PELVIS 1-2 VIEWS COMPARISON:  CT 01/07/2020 FINDINGS: SI joints are non widened. Pubic symphysis and rami appear intact. No fracture or dislocation. Degenerative changes of both hips IMPRESSION: No acute osseous abnormality Electronically Signed   By: Donavan Foil M.D.   On: 10/28/2020 22:39   DG Hand 2 View Left  Result Date: 10/29/2020 CLINICAL DATA:  Postreduction. EXAM: LEFT HAND - 2 VIEW COMPARISON:  Pre reduction radiograph  yesterday. FINDINGS: Proximal metacarpal fracture is unchanged in alignment or after application of splint material. Chronic changes with multifocal osteoarthritis and probable remote injury to the distal radius and ulna, not significantly changed. IMPRESSION: Unchanged alignment of proximal metacarpal fracture after application of splint material. Electronically Signed   By: Keith Rake M.D.   On: 10/29/2020 00:35   DG Chest Port 1 View  Result Date: 10/28/2020 CLINICAL DATA:  Fall EXAM: PORTABLE CHEST 1 VIEW COMPARISON:  08/16/2018 FINDINGS: Low lung volumes. Cardiomegaly with central bronchovascular crowding. Possible small left-sided effusion. Aortic atherosclerosis. No visible pneumothorax. Emphysema IMPRESSION: 1. Low lung volumes. 2. Cardiomegaly with bronchovascular crowding. Possible small left effusion Electronically Signed   By: Donavan Foil M.D.   On: 10/28/2020 22:30   DG Hand Complete Left  Result Date: 10/28/2020 CLINICAL DATA:  Fall EXAM: LEFT HAND - COMPLETE 3+ VIEW COMPARISON:  None. FINDINGS: Acute mildly comminuted fracture involving the base of the fifth metacarpal with about 1/2 shaft diameter dorsal displacement of distal fracture fragment, probable articular surface extension to the [facility] joint. No subluxation. Joint space narrowing and arthritis at the MCP, D IP and PIP joints. IMPRESSION: Acute mildly comminuted, likely intra-articular fracture involving the base of fifth metacarpal with mild displacement Electronically Signed   By: Donavan Foil M.D.   On: 10/28/2020 22:34   DG Hand Complete Right  Result Date: 10/28/2020 CLINICAL DATA:  Fall EXAM: RIGHT HAND - COMPLETE 3+ VIEW COMPARISON:  12/31/2012 FINDINGS: Acute mildly displaced intra-articular fracture involving the base of the first metacarpal. No subluxation. Arthritis at the IP joints and first [facility] joint. IMPRESSION: Acute mildly displaced intra-articular fracture at the base of the first metacarpal Electronically  Signed   By: Donavan Foil M.D.   On: 10/28/2020 22:36       Subjective: Patient is feeling better, no nausea or vomiting, no chest pain or dyspnea, his headache has improved, with oral analgesics.   Discharge Exam: Vitals:   10/30/20 0754 10/30/20 0826  BP:  (!) 161/77  Pulse:  81  Resp:  15  Temp:  98.1 F (36.7 C)  SpO2: 96% 91%   Vitals:   10/29/20 1725 10/29/20 2000 10/30/20 0754 10/30/20 0826  BP: (!) 181/55 (!) 168/76  (!) 161/77  Pulse: 61   81  Resp:    15  Temp:  97.8 F (36.6 C)  98.1 F (36.7 C)  TempSrc:  Oral  Oral  SpO2: 92%  96% 91%  Weight:      Height:        General: Not in pain or dyspnea  Neurology: Awake and alert, non focal  E ENT: no pallor, no icterus, oral mucosa moist Cardiovascular: No JVD. S1-S2 present, rhythmic, no gallops, rubs, or murmurs. No lower extremity edema. Pulmonary: positive breath sounds  bilaterally, adequate air movement, no wheezing, rhonchi or rales. Gastrointestinal. Abdomen soft and non tender Skin. Right groin yeast infection. Mild ecchymosis left frontal.  Musculoskeletal: no joint deformities   The results of significant diagnostics from this hospitalization (including imaging, microbiology, ancillary and laboratory) are listed below for reference.     Microbiology: Recent Results (from the past 240 hour(s))  SARS CORONAVIRUS 2 (TAT 6-24 HRS) Nasopharyngeal Nasopharyngeal Swab     Status: None   Collection Time: 10/29/20  3:53 AM   Specimen: Nasopharyngeal Swab  Result Value Ref Range Status   SARS Coronavirus 2 NEGATIVE NEGATIVE Final    Comment: (NOTE) SARS-CoV-2 target nucleic acids are NOT DETECTED.  The SARS-CoV-2 RNA is generally detectable in upper and lower respiratory specimens during the acute phase of infection. Negative results do not preclude SARS-CoV-2 infection, do not rule out co-infections with other pathogens, and should not be used as the sole basis for treatment or other patient  management decisions. Negative results must be combined with clinical observations, patient history, and epidemiological information. The expected result is Negative.  Fact Sheet for Patients: SugarRoll.be  Fact Sheet for Healthcare Providers: https://www.woods-mathews.com/  This test is not yet approved or cleared by the [place] FDA and  has been authorized for detection and/or diagnosis of SARS-CoV-2 by FDA under an Emergency Use Authorization (EUA). This EUA will remain  in effect (meaning this test can be used) for the duration of the COVID-19 declaration under Se ction 564(b)(1) of the Act, 21 U.S.C. section 360bbb-3(b)(1), unless the authorization is terminated or revoked sooner.  Performed at [facility], [place] [address], [place],  [address]      Labs: BNP (last 3 results) No results for input(s): BNP in the last 8760 hours. Basic Metabolic Panel: Recent Labs  Lab 10/28/20 2131 10/28/20 2213 10/29/20 0107 10/30/20 0549  NA 141 143 139 138  K 4.0 3.9 4.5 3.7  CL 106 104 105 103  CO2 27  --  26 28  GLUCOSE 147* 147* 161* 125*  BUN 15 18 15 13   CREATININE 1.36* 1.30* 1.31* 1.07  CALCIUM 9.9  --  9.6 9.8   Liver Function Tests: Recent Labs  Lab 10/28/20 2131  AST 29  ALT 17  ALKPHOS 83  BILITOT 0.5  PROT 7.1  ALBUMIN 3.4*   No results for input(s): LIPASE, AMYLASE in the last 168 hours. No results for input(s): AMMONIA in the last 168 hours. CBC: Recent Labs  Lab 10/28/20 2131 10/28/20 2213 10/29/20 0107  WBC 10.1  --  11.7*  NEUTROABS 5.9  --   --   HGB 14.3 14.6 13.4  HCT 44.8 43.0 43.5  MCV 89.6  --  93.1  PLT 207  --  193   Cardiac Enzymes: No results for input(s): CKTOTAL, CKMB, CKMBINDEX, TROPONINI in the last 168 hours. BNP: Invalid input(s): POCBNP CBG: No results for input(s): GLUCAP in the last 168 hours. D-Dimer No results for input(s): DDIMER in the last 72 hours. Hgb  A1c No results for input(s): HGBA1C in the last 72 hours. Lipid Profile No results for input(s): CHOL, HDL, LDLCALC, TRIG, CHOLHDL, LDLDIRECT in the last 72 hours. Thyroid function studies No results for input(s): TSH, T4TOTAL, T3FREE, THYROIDAB in the last 72 hours.  Invalid input(s): FREET3 Anemia work up No results for input(s): VITAMINB12, FOLATE, FERRITIN, TIBC, IRON, RETICCTPCT in the last 72 hours. Urinalysis    Component Value Date/Time   COLORURINE YELLOW 08/16/2019 2149  APPEARANCEUR CLEAR 08/16/2019 2149   LABSPEC 1.025 08/16/2019 2149   PHURINE 5.0 08/16/2019 2149   GLUCOSEU NEGATIVE 08/16/2019 2149   HGBUR NEGATIVE 08/16/2019 2149   BILIRUBINUR NEGATIVE 08/16/2019 2149   BILIRUBINUR ++ 11/18/2012 1319   KETONESUR NEGATIVE 08/16/2019 2149   PROTEINUR NEGATIVE 08/16/2019 2149   UROBILINOGEN 0.2 08/04/2014 1620   NITRITE NEGATIVE 08/16/2019 2149   LEUKOCYTESUR SMALL (A) 08/16/2019 2149   Sepsis Labs Invalid input(s): PROCALCITONIN,  WBC,  LACTICIDVEN Microbiology Recent Results (from the past 240 hour(s))  SARS CORONAVIRUS 2 (TAT 6-24 HRS) Nasopharyngeal Nasopharyngeal Swab     Status: None   Collection Time: 10/29/20  3:53 AM   Specimen: Nasopharyngeal Swab  Result Value Ref Range Status   SARS Coronavirus 2 NEGATIVE NEGATIVE Final    Comment: (NOTE) SARS-CoV-2 target nucleic acids are NOT DETECTED.  The SARS-CoV-2 RNA is generally detectable in upper and lower respiratory specimens during the acute phase of infection. Negative results do not preclude SARS-CoV-2 infection, do not rule out co-infections with other pathogens, and should not be used as the sole basis for treatment or other patient management decisions. Negative results must be combined with clinical observations, patient history, and epidemiological information. The expected result is Negative.  Fact Sheet for Patients: SugarRoll.be  Fact Sheet for Healthcare  Providers: https://www.woods-mathews.com/  This test is not yet approved or cleared by the [place] FDA and  has been authorized for detection and/or diagnosis of SARS-CoV-2 by FDA under an Emergency Use Authorization (EUA). This EUA will remain  in effect (meaning this test can be used) for the duration of the COVID-19 declaration under Se ction 564(b)(1) of the Act, 21 U.S.C. section 360bbb-3(b)(1), unless the authorization is terminated or revoked sooner.  Performed at [facility], [place] [address] [place], [place] [address]      Time coordinating discharge: 45 minutes  SIGNED:   Tawni Millers, MD  [facility] 10/30/2020, 9:26 AM

## 2020-10-30 NOTE — Progress Notes (Signed)
Awaiting DME to be delivered to room prior to d/c. Secure chat sent to SW for an update on delivery time.

## 2020-10-30 NOTE — Progress Notes (Signed)
Discharge orders received. Printed AVS forms and reviewed with patient and family at bedside; verbalized understanding. PIVx1 removed. DME to be delivered to home per CM note.   1750: Patient dressed and family downstairs to take patient home. This RN wheeled patient down and all personal belongings with patient.

## 2020-10-30 NOTE — TOC Transition Note (Signed)
Transition of Care [facility]) - CM/SW Discharge Note   Patient Details  Name: [name] MRN: [id number] Date of Birth: 10/25/1940  Transition of Care [facility]) CM/SW Contact:  Bartholomew Crews, RN Phone Number: [phone] 10/30/2020, 12:01 PM   Clinical Narrative:     Patient to transition home today. Spoke with patient and daughter at bedside. Referral to [employer] for delivery of bariatric 3/1 and wheelchair to room. Advised that lift chair could not be arranged from hospital. Referral to [facility] for [facility] PT accepted. Family to provide transportation home. No further TOC needs identified at this time.   Final next level of care: [place] Barriers to Discharge: No Barriers Identified   Patient Goals and CMS Choice Patient states their goals for this hospitalization and ongoing recovery are:: return home with family support CMS Medicare.gov Compare Post Acute Care list provided to:: Patient Choice offered to / list presented to : Patient, Adult Children  Discharge Placement                       Discharge Plan and Services                DME Arranged: 3-N-1, Wheelchair manual DME Agency: [employer] Date DME Agency Contacted: 10/30/20 Time DME Agency Contacted: 1100 Representative spoke with at DME Agency: [place]: PT [place]: [place] Date [place]: 10/30/20 Time [place]: 1200 Representative spoke with at [place]: [place]) Interventions     Readmission Risk Interventions No flowsheet data found.

## 2020-10-30 NOTE — TOC CAGE-AID Note (Signed)
Transition of Care [facility]) - CAGE-AID Screening   Patient Details  Name: [name] MRN: [id number] Date of Birth: [birth year]  Transition of Care [facility]) CM/SW Contact:    Clovis Cao, RN Phone Number: [phone] 10/30/2020, 6:23 PM   Clinical Narrative: Pt here after walking to his car and missing a step resulting in a fall.  He hit his head and sustained a hematoma and wrist deformity.  Pt denies alcohol or drug use.   CAGE-AID Screening:    Have You Ever Felt You Ought to Cut Down on Your Drinking or Drug Use?: No Have People Annoyed You By Critizing Your Drinking Or Drug Use?: No Have You Felt Bad Or Guilty About Your Drinking Or Drug Use?: No Have You Ever Had a Drink or Used Drugs First Thing In The Morning to Steady Your Nerves or to Get Rid of a Hangover?: No CAGE-AID Score: 0  Substance Abuse Education Offered: No

## 2020-10-30 NOTE — Evaluation (Signed)
Physical Therapy Evaluation Patient Details Name: [name] MRN: [id number] DOB: Feb 21, 1940 Today's Date: 10/30/2020  History of Present Illness  Pt is an 80 y.o. male admitted 9/23 following a fall in the community. He sustained a head concussion, L 5th MC fx, and R 1st MC fx. PMH of CAD status post stenting, diastolic CHF, O2 dependent COPD, sleep apnea, and ulcerative colitis.   Clinical Impression  Pt admitted with above diagnosis. PTA pt lived at home with his daughter and had a PCA in the morning M-F. He was ambulatory limited distances with rollator. On eval, he required mod assist bed mobility, mod assist sit to stand, and mod assist SPT. Pt demonstrated good ability to maintain NWB bilat hands. Pt will benefit from skilled PT to increase their independence and safety with mobility to allow discharge to the venue listed below.  Daughter present in room and confirms family's desire for pt to return home and family's ability to provide needed level of assist. Recommend transfers only initially at wheelchair level, then progressing to ambulation with bilat platform RW. Either with acute care therapy or HHPT depending on d/c. Daughter expresses concerns regarding bathroom access with w/c. Therefore, recommending bariatric BSC.         Recommendations for follow up therapy are one component of a multi-disciplinary discharge planning process, led by the attending physician.  Recommendations may be updated based on patient status, additional functional criteria and insurance authorization.  Follow Up Recommendations Home health PT;Supervision/Assistance - 24 hour    Equipment Recommendations  Wheelchair (measurements PT);3in1 (PT);Other (comment) (bariatric 3n1, wide w/c, bariatric lift chair)    Recommendations for Other Services       Precautions / Restrictions Precautions Precautions: Fall Required Braces or Orthoses: Splint/Cast Splint/Cast: bilat hand/wrist splints in  place Splint/Cast - Date Prophylactic Dressing Applied (if applicable): 40/34/74 (in ED) Restrictions Weight Bearing Restrictions: Yes RUE Weight Bearing: Weight bear through elbow only LUE Weight Bearing: Weight bear through elbow only Other Position/Activity Restrictions: No WB orders in chart. Maintained NWB bilat hands      Mobility  Bed Mobility Overal bed mobility: Needs Assistance Bed Mobility: Supine to Sit     Supine to sit: Mod assist;HOB elevated     General bed mobility comments: cues for sequencing, assist to elevate trunk, use of bed pad to scoot to EOB    Transfers Overall transfer level: Needs assistance Equipment used: 1 person hand held assist Transfers: Sit to/from [employer] Transfers Sit to Stand: Mod assist Stand pivot transfers: Mod assist       General transfer comment: mod assist to power up with therapist anterior to pt providing support at gait belt, increased time to stabilize standing balance, Pt able to take pivot steps bed to recliner.  Ambulation/Gait                Stairs            Wheelchair Mobility    Modified Rankin (Stroke Patients Only)       Balance Overall balance assessment: Needs assistance Sitting-balance support: No upper extremity supported;Feet supported Sitting balance-Leahy Scale: Fair     Standing balance support: No upper extremity supported;During functional activity Standing balance-Leahy Scale: Poor Standing balance comment: reliant on external support                             Pertinent Vitals/Pain Pain Assessment: Faces Faces Pain Scale: Hurts little more  Pain Location: headache Pain Descriptors / Indicators: Headache Pain Intervention(s): Monitored during session;Repositioned;Limited activity within patient's tolerance    Home Living Family/patient expects to be discharged to:: Private residence Living Arrangements: Children Available Help at Discharge:  Family;Personal care attendant;Available 24 hours/day Type of Home: House Home Access: Stairs to enter Entrance Stairs-Rails: Left Entrance Stairs-Number of Steps: 1 Home Layout: One level Home Equipment: Walker - 4 wheels;Walker - 2 wheels;Shower seat;Other (comment) (lift chair)      Prior Function                 Hand Dominance   Dominant Hand: Right    Extremity/Trunk Assessment   Upper Extremity Assessment Upper Extremity Assessment:  (bilat hand/wrist splints in place)    Lower Extremity Assessment Lower Extremity Assessment: Generalized weakness       Communication   Communication: HOH  Cognition Arousal/Alertness: Awake/alert Behavior During Therapy: Flat affect;Anxious Overall Cognitive Status: History of cognitive impairments - at baseline                                 General Comments: Appropriate. Able to follow commands. Mild confusion but could be related to [facility]. Daughter present in room to confirm cognition.      General Comments General comments (skin integrity, edema, etc.): SpO2 91% on 5L    Exercises     Assessment/Plan    PT Assessment Patient needs continued PT services  PT Problem List Decreased strength;Decreased mobility;Decreased safety awareness;Decreased knowledge of precautions;Decreased activity tolerance;Pain;Decreased balance;Decreased knowledge of use of DME       PT Treatment Interventions DME instruction;Therapeutic activities;Gait training;Therapeutic exercise;Patient/family education;Balance training;Functional mobility training    PT Goals (Current goals can be found in the Care Plan section)  Acute Rehab PT Goals Patient Stated Goal: home PT Goal Formulation: With patient/family Time For Goal Achievement: 11/13/20 Potential to Achieve Goals: Good    Frequency Min 5X/week   Barriers to discharge        Co-evaluation               AM-PAC PT "6 Clicks" Mobility  Outcome Measure Help  needed turning from your back to your side while in a flat bed without using bedrails?: A Lot Help needed moving from lying on your back to sitting on the side of a flat bed without using bedrails?: A Lot Help needed moving to and from a bed to a chair (including a wheelchair)?: A Lot Help needed standing up from a chair using your arms (e.g., wheelchair or bedside chair)?: A Lot Help needed to walk in hospital room?: A Lot Help needed climbing 3-5 steps with a railing? : Total 6 Click Score: 11    End of Session Equipment Utilized During Treatment: Gait belt;Oxygen Activity Tolerance: Patient tolerated treatment well Patient left: in chair;with call bell/phone within reach;with chair alarm set;with family/visitor present Nurse Communication: Mobility status PT Visit Diagnosis: Unsteadiness on feet (R26.81);Difficulty in walking, not elsewhere classified (R26.2);Pain;History of falling (Z91.81)    Time: 0830-0900 PT Time Calculation (min) (ACUTE ONLY): 30 min   Charges:   PT Evaluation $PT Eval Moderate Complexity: 1 Mod PT Treatments $Therapeutic Activity: 8-22 mins        Lorrin Goodell, PT  Office # [phone] Pager [phone]   Lorriane Shire 10/30/2020, 9:23 AM

## 2020-10-30 NOTE — TOC Transition Note (Signed)
Transition of Care [facility]) - CM/SW Discharge Note   Patient Details  Name: [name] MRN: [id number] Date of Birth: [birth year]  Transition of Care [facility]) CM/SW Contact:  Bartholomew Crews, RN Phone Number: [phone] 10/30/2020, 3:00 PM   Clinical Narrative:     Spoke with family about DME. Patient already has 3/1 received in 2020, so not eligible for new one. Family asked about getting bariatric rollator like the one PT used with patient rather than a wheelchair stating wheelchair would be too big for the home. [employer] notified of change in DME. Rollator to be delivered to the home.   Final next level of care: [place] Barriers to Discharge: No Barriers Identified   Patient Goals and CMS Choice Patient states their goals for this hospitalization and ongoing recovery are:: return home with family support CMS Medicare.gov Compare Post Acute Care list provided to:: Patient Choice offered to / list presented to : Patient, Adult Children  Discharge Placement                       Discharge Plan and Services                DME Arranged: 3-N-1, Wheelchair manual DME Agency: [employer] Date DME Agency Contacted: 10/30/20 Time DME Agency Contacted: 1100 Representative spoke with at DME Agency: [place]: PT [place]: [place] Date [place]: 10/30/20 Time [place]: 1200 Representative spoke with at [place]: [place] ([place]) Interventions     Readmission Risk Interventions No flowsheet data found.

## 2020-10-30 NOTE — Care Management Obs Status (Signed)
[place] NOTIFICATION   Patient Details  Name: [name] MRN: [id number] Date of Birth: [birth year]   Medicare Observation Status Notification Given:  Yes    Bartholomew Crews, RN 10/30/2020, 1:15 PM

## 2020-10-31 ENCOUNTER — Telehealth: Payer: Self-pay

## 2020-10-31 NOTE — Telephone Encounter (Signed)
Transition Care Management Follow-up Telephone Call Date of discharge and from where: 10/30/20, [place] Diagnosis: Syncope with collapse, concussion, hand fx. How have you been since you were released from the hospital? Pt's daughter states pt is doing, "okay." Any questions or concerns? No  Items Reviewed: Did the pt receive and understand the discharge instructions provided? Yes  Medications obtained and verified? Yes  Other? No  Any new allergies since your discharge? No  Dietary orders reviewed? Yes Do you have support at home? Yes   Home Care and Equipment/Supplies: Were home health services ordered? yes If so, what is the name of the agency? [facility]  Has the agency set up a time to come to the patient's home? They have not called yet per pt's daughter.  Were any new equipment or medical supplies ordered?  No What is the name of the medical supply agency?  Were you able to get the supplies/equipment? No. Daughter states new walker was ordered but wrong one sent. Daughter states supply company is coming out today with new walker for patient.  Do you have any questions related to the use of the equipment or supplies? No  Functional Questionnaire: (I = Independent and D = Dependent) ADLs: D  Bathing/Dressing- D  Meal Prep- D  Eating- D  Maintaining continence- D  Transferring/Ambulation- D  Managing Meds- D  Follow up appointments reviewed:  PCP Hospital f/u appt confirmed? Yes  Scheduled to see Dr. Wolfgang Phoenix on 11/03/20 @ 11:20 am. [place] [facility] f/u appt confirmed?  Not yet. Family is calling today to set up appointment with [facility].    Are transportation arrangements needed? No  If their condition worsens, is the pt aware to call PCP or go to the Emergency Dept.? Yes Was the patient provided with contact information for the PCP's office or ED? Yes Was to pt encouraged to call back with questions or concerns? Yes

## 2020-10-31 NOTE — Telephone Encounter (Signed)
Transition Care Management Unsuccessful Follow-up Telephone Call  Date of discharge and from where:  10/30/20, [name]  Diagnosis: Syncope & Collapse, Head Concussion, Bilateral hand fx.   Attempts:  1st Attempt  Reason for unsuccessful TCM follow-up call:  Left voice message

## 2020-11-01 DIAGNOSIS — S63287A Dislocation of proximal interphalangeal joint of left little finger, initial encounter: Secondary | ICD-10-CM | POA: Diagnosis not present

## 2020-11-01 DIAGNOSIS — M79642 Pain in left hand: Secondary | ICD-10-CM | POA: Diagnosis not present

## 2020-11-01 DIAGNOSIS — S62317A Displaced fracture of base of fifth metacarpal bone. left hand, initial encounter for closed fracture: Secondary | ICD-10-CM | POA: Diagnosis not present

## 2020-11-01 DIAGNOSIS — S62231A Other displaced fracture of base of first metacarpal bone, right hand, initial encounter for closed fracture: Secondary | ICD-10-CM | POA: Diagnosis not present

## 2020-11-01 DIAGNOSIS — M79641 Pain in right hand: Secondary | ICD-10-CM | POA: Diagnosis not present

## 2020-11-02 DIAGNOSIS — S63287A Dislocation of proximal interphalangeal joint of left little finger, initial encounter: Secondary | ICD-10-CM | POA: Diagnosis not present

## 2020-11-02 DIAGNOSIS — G8918 Other acute postprocedural pain: Secondary | ICD-10-CM | POA: Diagnosis not present

## 2020-11-02 DIAGNOSIS — S62231A Other displaced fracture of base of first metacarpal bone, right hand, initial encounter for closed fracture: Secondary | ICD-10-CM | POA: Diagnosis not present

## 2020-11-02 DIAGNOSIS — S63065A Dislocation of metacarpal (bone), proximal end of left hand, initial encounter: Secondary | ICD-10-CM | POA: Diagnosis not present

## 2020-11-02 DIAGNOSIS — S62317A Displaced fracture of base of fifth metacarpal bone. left hand, initial encounter for closed fracture: Secondary | ICD-10-CM | POA: Diagnosis not present

## 2020-11-02 DIAGNOSIS — X58XXXA Exposure to other specified factors, initial encounter: Secondary | ICD-10-CM | POA: Diagnosis not present

## 2020-11-02 DIAGNOSIS — S62211A Bennett's fracture, right hand, initial encounter for closed fracture: Secondary | ICD-10-CM | POA: Diagnosis not present

## 2020-11-02 DIAGNOSIS — W19XXXA Unspecified fall, initial encounter: Secondary | ICD-10-CM | POA: Diagnosis not present

## 2020-11-03 ENCOUNTER — Inpatient Hospital Stay: Payer: Medicare Other | Admitting: Family Medicine

## 2020-11-04 ENCOUNTER — Encounter: Payer: Self-pay | Admitting: Podiatry

## 2020-11-04 ENCOUNTER — Other Ambulatory Visit: Payer: Self-pay

## 2020-11-04 ENCOUNTER — Ambulatory Visit: Payer: Medicare Other | Admitting: Podiatry

## 2020-11-04 DIAGNOSIS — S90221A Contusion of right lesser toe(s) with damage to nail, initial encounter: Secondary | ICD-10-CM | POA: Diagnosis not present

## 2020-11-04 DIAGNOSIS — B351 Tinea unguium: Secondary | ICD-10-CM | POA: Diagnosis not present

## 2020-11-04 DIAGNOSIS — M79676 Pain in unspecified toe(s): Secondary | ICD-10-CM

## 2020-11-04 NOTE — Progress Notes (Signed)
Subjective: [name] is a pleasant 80 y.o. male patient seen today painful thick toenails that are difficult to trim. Pain interferes with ambulation. Aggravating factors include wearing enclosed shoe gear. Pain is relieved with periodic professional debridement.  His daughter is present during today's visit.   Patient had a fall and was admitted to the hospital. He injured his head and broke fingers on both hands. He has had surgery on both hands for the fracture.  Daughter states his right 5th toenail is discolored, but this was present before his fall when he stubbed his toe on something. They deny any redness, drainage, swelling or loose toenail.  PCP is [name], MD. Last visit was: 06/23/2020.  No Known Allergies  Objective: Physical Exam  General: [name] is a pleasant 80 y.o. Caucasian male, morbidly obese in NAD. AAO x 3.   Vascular:  Capillary fill time to digits <3 seconds b/l lower extremities. Palpable DP pulse(s) b/l lower extremities Palpable PT pulse(s) b/l lower extremities Pedal hair absent. Lower extremity skin temperature gradient within normal limits. Trace edema noted b/l lower extremities.  Dermatological:  Pedal skin with normal turgor, texture and tone b/l lower extremities. No open wounds b/l lower extremities. No interdigital macerations b/l lower extremities. Toenails 2-5 bilaterally and R hallux severely elongated, thickened with discoloration, also possessing excessive curvature and impinging onto pedal the distal tips of the digits. No erythema, no edema, no drainage, no fluctuance. Anonychia noted L hallux. Nailbed(s) epithelialized.  There is evidence of subacute subungual hematoma of the R 5th toe. Nailplate remains adhered. There is no  tenderness to palpation.   Musculoskeletal:  Normal muscle strength 5/5 to all lower extremity muscle groups bilaterally. No pain crepitus or joint limitation noted with ROM b/l lower extremities.  No gross bony deformities b/l lower extremities. Utilizes rollator for ambulation assistance.  Neurological:  Protective sensation intact 5/5 intact bilaterally with 10g monofilament b/l.  Assessment and Plan:  1. Pain due to onychomycosis of toenail   2. Subungual hematoma of fifth toe of right foot, initial encounter    -Examined patient. -He has subacute hematoma of the right 5th digit toenail. It remains adhered. Monitor. -Patient to continue soft, supportive shoe gear daily. -Toenails 2-5 bilaterally and R hallux debrided in length and girth without iatrogenic bleeding with sterile nail nipper and dremel.  -Patient to report any pedal injuries to medical professional immediately. -Patient/POA to call should there be question/concern in the interim.  Return in about 3 months (around 02/03/2021).  Marzetta Board, DPM

## 2020-11-08 DIAGNOSIS — G4733 Obstructive sleep apnea (adult) (pediatric): Secondary | ICD-10-CM | POA: Diagnosis not present

## 2020-11-10 ENCOUNTER — Encounter: Payer: Self-pay | Admitting: Family Medicine

## 2020-11-10 ENCOUNTER — Ambulatory Visit (INDEPENDENT_AMBULATORY_CARE_PROVIDER_SITE_OTHER): Payer: Medicare Other | Admitting: Family Medicine

## 2020-11-10 ENCOUNTER — Other Ambulatory Visit: Payer: Self-pay

## 2020-11-10 VITALS — BP 160/74 | HR 58 | Temp 97.2°F | Ht 72.0 in | Wt 297.0 lb

## 2020-11-10 DIAGNOSIS — I10 Essential (primary) hypertension: Secondary | ICD-10-CM | POA: Diagnosis not present

## 2020-11-10 DIAGNOSIS — Z23 Encounter for immunization: Secondary | ICD-10-CM

## 2020-11-10 DIAGNOSIS — R55 Syncope and collapse: Secondary | ICD-10-CM | POA: Diagnosis not present

## 2020-11-10 DIAGNOSIS — G4733 Obstructive sleep apnea (adult) (pediatric): Secondary | ICD-10-CM | POA: Diagnosis not present

## 2020-11-10 DIAGNOSIS — E038 Other specified hypothyroidism: Secondary | ICD-10-CM | POA: Diagnosis not present

## 2020-11-10 NOTE — Progress Notes (Signed)
   Subjective:    Patient ID: [name], male    DOB: 1940-04-08, 80 y.o.   MRN: [id number]  HPI Hospitalization follow up - syncopy and collapse x 2 weeks ago patient took a fall.  Broke both of his hands.  Had severe contusion to his head.  In addition to this now using a rolling walker.  Starting to gain confidence.  Hospital notes labs x-rays reviewed with patient He is following through with orthopedics   Request for sleep study orders- trouble sleeping  While in the hospital it was noted that he had severe sleep apnea and they recommended that he see a sleep specialist Review of Systems     Objective:   Physical Exam  General-in no acute distress Eyes-no discharge Lungs-respiratory rate normal, CTA CV-no murmurs,RRR Extremities skin warm dry some lower pedal edema Neuro grossly normal Behavior normal, alert Frontal contusion with hematoma      Assessment & Plan:  Referral for sleep study  Fracture of hands follow through with orthopedics  Passing out spell none recently but I am very concerned his blood pressure does drop when he stands I would not recommend increasing his medication currently  Hospital records x-rays labs were reviewed with patient  Lab work ordered  Patient to do a follow-up visit in approximately 6 weeks  Large frontal contusion with hematoma on the forehead will take several weeks to go down CT scan looked good

## 2020-11-11 DIAGNOSIS — M79644 Pain in right finger(s): Secondary | ICD-10-CM | POA: Diagnosis not present

## 2020-11-11 DIAGNOSIS — S62317A Displaced fracture of base of fifth metacarpal bone. left hand, initial encounter for closed fracture: Secondary | ICD-10-CM | POA: Diagnosis not present

## 2020-11-11 DIAGNOSIS — M79642 Pain in left hand: Secondary | ICD-10-CM | POA: Diagnosis not present

## 2020-11-11 DIAGNOSIS — S62231A Other displaced fracture of base of first metacarpal bone, right hand, initial encounter for closed fracture: Secondary | ICD-10-CM | POA: Diagnosis not present

## 2020-11-11 LAB — BASIC METABOLIC PANEL
BUN/Creatinine Ratio: 18 (ref 10–24)
BUN: 19 mg/dL (ref 8–27)
CO2: 22 mmol/L (ref 20–29)
Calcium: 10.2 mg/dL (ref 8.6–10.2)
Chloride: 105 mmol/L (ref 96–106)
Creatinine, Ser: 1.06 mg/dL (ref 0.76–1.27)
Glucose: 106 mg/dL — ABNORMAL HIGH (ref 70–99)
Potassium: 4.5 mmol/L (ref 3.5–5.2)
Sodium: 141 mmol/L (ref 134–144)
eGFR: 70 mL/min/{1.73_m2} (ref >59)

## 2020-11-11 LAB — TSH: TSH: 3.68 u[IU]/mL (ref 0.450–4.500)

## 2020-11-14 DIAGNOSIS — M79644 Pain in right finger(s): Secondary | ICD-10-CM | POA: Diagnosis not present

## 2020-11-14 DIAGNOSIS — M79642 Pain in left hand: Secondary | ICD-10-CM | POA: Diagnosis not present

## 2020-11-18 DIAGNOSIS — H43392 Other vitreous opacities, left eye: Secondary | ICD-10-CM | POA: Diagnosis not present

## 2020-11-19 DIAGNOSIS — J441 Chronic obstructive pulmonary disease with (acute) exacerbation: Secondary | ICD-10-CM | POA: Diagnosis not present

## 2020-11-19 DIAGNOSIS — R0902 Hypoxemia: Secondary | ICD-10-CM | POA: Diagnosis not present

## 2020-11-19 DIAGNOSIS — J9601 Acute respiratory failure with hypoxia: Secondary | ICD-10-CM | POA: Diagnosis not present

## 2020-11-24 ENCOUNTER — Other Ambulatory Visit: Payer: Self-pay | Admitting: Family Medicine

## 2020-11-24 ENCOUNTER — Other Ambulatory Visit: Payer: Self-pay

## 2020-11-24 MED ORDER — NYSTATIN 100000 UNIT/GM EX CREA
1.0000 "application " | TOPICAL_CREAM | Freq: Two times a day (BID) | CUTANEOUS | 0 refills | Status: DC | PRN
  Filled 2020-11-24: qty 30, 15d supply, fill #0

## 2020-11-24 MED ORDER — ACETAMINOPHEN 500 MG PO TABS
1000.0000 mg | ORAL_TABLET | Freq: Four times a day (QID) | ORAL | 0 refills | Status: DC | PRN
  Filled 2020-11-24: qty 30, 4d supply, fill #0

## 2020-11-25 DIAGNOSIS — S62231A Other displaced fracture of base of first metacarpal bone, right hand, initial encounter for closed fracture: Secondary | ICD-10-CM | POA: Diagnosis not present

## 2020-11-25 DIAGNOSIS — S62317A Displaced fracture of base of fifth metacarpal bone. left hand, initial encounter for closed fracture: Secondary | ICD-10-CM | POA: Diagnosis not present

## 2020-11-29 DIAGNOSIS — M79644 Pain in right finger(s): Secondary | ICD-10-CM | POA: Diagnosis not present

## 2020-12-06 ENCOUNTER — Other Ambulatory Visit: Payer: Self-pay

## 2020-12-14 ENCOUNTER — Other Ambulatory Visit: Payer: Self-pay

## 2020-12-14 MED ORDER — METOPROLOL TARTRATE 50 MG PO TABS: 25.0000 mg | ORAL_TABLET | Freq: Every evening | ORAL | 5 refills | Status: DC

## 2020-12-16 DIAGNOSIS — S62231A Other displaced fracture of base of first metacarpal bone, right hand, initial encounter for closed fracture: Secondary | ICD-10-CM | POA: Diagnosis not present

## 2020-12-16 DIAGNOSIS — S62317A Displaced fracture of base of fifth metacarpal bone. left hand, initial encounter for closed fracture: Secondary | ICD-10-CM | POA: Diagnosis not present

## 2020-12-20 DIAGNOSIS — J9601 Acute respiratory failure with hypoxia: Secondary | ICD-10-CM | POA: Diagnosis not present

## 2020-12-20 DIAGNOSIS — R0902 Hypoxemia: Secondary | ICD-10-CM | POA: Diagnosis not present

## 2020-12-20 DIAGNOSIS — J441 Chronic obstructive pulmonary disease with (acute) exacerbation: Secondary | ICD-10-CM | POA: Diagnosis not present

## 2020-12-22 ENCOUNTER — Other Ambulatory Visit: Payer: Self-pay | Admitting: Family Medicine

## 2020-12-22 ENCOUNTER — Telehealth: Payer: Self-pay | Admitting: Family Medicine

## 2020-12-22 MED ORDER — MUPIROCIN 2 % EX OINT: 1.0000 "application " | TOPICAL_OINTMENT | Freq: Two times a day (BID) | CUTANEOUS | 5 refills | Status: DC | PRN

## 2020-12-22 NOTE — Telephone Encounter (Signed)
Daughter is requesting new prescription with refills for  mupirocin ointment called into [id number]

## 2020-12-22 NOTE — Telephone Encounter (Signed)
Scription was sent in

## 2020-12-23 ENCOUNTER — Telehealth: Payer: Self-pay | Admitting: Family Medicine

## 2020-12-23 ENCOUNTER — Ambulatory Visit: Payer: Medicare Other | Admitting: Family Medicine

## 2020-12-23 ENCOUNTER — Other Ambulatory Visit: Payer: Self-pay

## 2020-12-23 MED ORDER — NYSTATIN 100000 UNIT/GM EX CREA: 1.0000 "application " | TOPICAL_CREAM | Freq: Two times a day (BID) | CUTANEOUS | 0 refills | Status: DC | PRN

## 2020-12-23 NOTE — Telephone Encounter (Signed)
Daughter forget to get refill on nystatin cream called into [facility] [place]

## 2020-12-27 NOTE — Telephone Encounter (Signed)
6 refills °

## 2020-12-28 MED ORDER — NYSTATIN 100000 UNIT/GM EX CREA: 1.0000 "application " | TOPICAL_CREAM | Freq: Two times a day (BID) | CUTANEOUS | 5 refills | Status: DC | PRN

## 2020-12-28 NOTE — Telephone Encounter (Signed)
Refill for 30 g sent in on 12/23/20 with no refills; resent to pharmacy with refills. Left message to return call

## 2020-12-28 NOTE — Telephone Encounter (Signed)
Pt daughter returned call and verbalized understanding.

## 2020-12-31 ENCOUNTER — Other Ambulatory Visit: Payer: Self-pay | Admitting: Family Medicine

## 2021-01-18 ENCOUNTER — Telehealth: Payer: Self-pay | Admitting: *Deleted

## 2021-01-18 NOTE — Chronic Care Management (AMB) (Addendum)
Chronic Care Management   Note  01/18/2021 Name: [name] MRN: [id number] DOB: [birth year]  [name] is a 80 y.o. year old male who is a primary care patient of Luking, Elayne Snare, MD. I reached out to [name] by phone today in response to a referral sent by [name].  [name] was given information about Chronic Care Management services today including:  CCM service includes personalized support from designated clinical staff supervised by his physician, including individualized plan of care and coordination with other care providers 24/7 contact phone numbers for assistance for urgent and routine care needs. Service will only be billed when office clinical staff spend 20 minutes or more in a month to coordinate care. Only one practitioner may furnish and bill the service in a calendar month. The patient may stop CCM services at any time (effective at the end of the month) by phone call to the office staff. The patient is responsible for co-pay (up to 20% after annual deductible is met) if co-pay is required by the individual health plan.   Daughter Mishon, [place] DPR on file verbally agreed to assistance and services provided by embedded care coordination/care management team today.  Follow up plan: Telephone appointment with care management team member scheduled for:02/03/21  [name]: [phone]

## 2021-01-19 DIAGNOSIS — J441 Chronic obstructive pulmonary disease with (acute) exacerbation: Secondary | ICD-10-CM | POA: Diagnosis not present

## 2021-01-19 DIAGNOSIS — J9601 Acute respiratory failure with hypoxia: Secondary | ICD-10-CM | POA: Diagnosis not present

## 2021-01-19 DIAGNOSIS — R0902 Hypoxemia: Secondary | ICD-10-CM | POA: Diagnosis not present

## 2021-01-23 ENCOUNTER — Other Ambulatory Visit: Payer: Self-pay | Admitting: Specialist

## 2021-01-23 DIAGNOSIS — M542 Cervicalgia: Secondary | ICD-10-CM

## 2021-01-26 DIAGNOSIS — K921 Melena: Secondary | ICD-10-CM | POA: Insufficient documentation

## 2021-01-31 ENCOUNTER — Other Ambulatory Visit: Payer: Self-pay

## 2021-01-31 ENCOUNTER — Telehealth: Payer: Self-pay

## 2021-01-31 ENCOUNTER — Ambulatory Visit (INDEPENDENT_AMBULATORY_CARE_PROVIDER_SITE_OTHER): Payer: Medicare Other

## 2021-01-31 VITALS — Ht 73.0 in | Wt 300.0 lb

## 2021-01-31 DIAGNOSIS — Z0000 Encounter for general adult medical examination without abnormal findings: Secondary | ICD-10-CM | POA: Diagnosis not present

## 2021-01-31 NOTE — Patient Instructions (Signed)
[name] , Thank you for taking time to come for your Medicare Wellness Visit. I appreciate your ongoing commitment to your health goals. Please review the following plan we discussed and let me know if I can assist you in the future.   Screening recommendations/referrals: Colonoscopy: Done 07/29/2017. Repeat every 7 years.   Recommended yearly ophthalmology/optometry visit for glaucoma screening and checkup Recommended yearly dental visit for hygiene and checkup  Vaccinations: Influenza vaccine: Done 11/10/2020. Repeat annually  Pneumococcal vaccine: Done 11/18/2014 and 11/06/2018.  Tdap vaccine: Done 07/05/2009 Repeat in 10 years  Shingles vaccine: Done 01/19/2019. Second dose due after first of year.    Covid-19: Done 03/21/2019 and 07/05/2019  Advanced directives: Please bring a copy of your health care power of attorney and living will to the office to be added to your chart at your convenience.   Conditions/risks identified: Aim for 30 minutes of exercise each day, drink 6-8 glasses of water and eat lots of fruits and vegetables.   Next appointment: Follow up in one year for your annual wellness visit. 2023.  Preventive Care 82 Years and Older, Male  Preventive care refers to lifestyle choices and visits with your health care provider that can promote health and wellness. What does preventive care include? A yearly physical exam. This is also called an annual well check. Dental exams once or twice a year. Routine eye exams. Ask your health care provider how often you should have your eyes checked. Personal lifestyle choices, including: Daily care of your teeth and gums. Regular physical activity. Eating a healthy diet. Avoiding tobacco and drug use. Limiting alcohol use. Practicing safe sex. Taking low doses of aspirin every day. Taking vitamin and mineral supplements as recommended by your health care provider. What happens during an annual well check? The services  and screenings done by your health care provider during your annual well check will depend on your age, overall health, lifestyle risk factors, and family history of disease. Counseling  Your health care provider may ask you questions about your: Alcohol use. Tobacco use. Drug use. Emotional well-being. Home and relationship well-being. Sexual activity. Eating habits. History of falls. Memory and ability to understand (cognition). Work and work Statistician. Screening  You may have the following tests or measurements: Height, weight, and BMI. Blood pressure. Lipid and cholesterol levels. These may be checked every 5 years, or more frequently if you are over 82 years old. Skin check. Lung cancer screening. You may have this screening every year starting at age 82 if you have a 30-pack-year history of smoking and currently smoke or have quit within the past 15 years. Fecal occult blood test (FOBT) of the stool. You may have this test every year starting at age 82. Flexible sigmoidoscopy or colonoscopy. You may have a sigmoidoscopy every 5 years or a colonoscopy every 10 years starting at age 82. Prostate cancer screening. Recommendations will vary depending on your family history and other risks. Hepatitis C blood test. Hepatitis B blood test. Sexually transmitted disease (STD) testing. Diabetes screening. This is done by checking your blood sugar (glucose) after you have not eaten for a while (fasting). You may have this done every 1-3 years. Abdominal aortic aneurysm (AAA) screening. You may need this if you are a current or former smoker. Osteoporosis. You may be screened starting at age 82 if you are at high risk. Talk with your health care provider about your test results, treatment options, and if necessary, the need for more tests.  Vaccines  Your health care provider may recommend certain vaccines, such as: Influenza vaccine. This is recommended every year. Tetanus, diphtheria,  and acellular pertussis (Tdap, Td) vaccine. You may need a Td booster every 10 years. Zoster vaccine. You may need this after age 39. Pneumococcal 13-valent conjugate (PCV13) vaccine. One dose is recommended after age 31. Pneumococcal polysaccharide (PPSV23) vaccine. One dose is recommended after age 70. Talk to your health care provider about which screenings and vaccines you need and how often you need them. This information is not intended to replace advice given to you by your health care provider. Make sure you discuss any questions you have with your health care provider. Document Released: 02/18/2015 Document Revised: 10/12/2015 Document Reviewed: 11/23/2014 Elsevier Interactive Patient Education  2017 [place] Prevention in the Home Falls can cause injuries. They can happen to people of all ages. There are many things you can do to make your home safe and to help prevent falls. What can I do on the outside of my home? Regularly fix the edges of walkways and driveways and fix any cracks. Remove anything that might make you trip as you walk through a door, such as a raised step or threshold. Trim any bushes or trees on the path to your home. Use bright outdoor lighting. Clear any walking paths of anything that might make someone trip, such as rocks or tools. Regularly check to see if handrails are loose or broken. Make sure that both sides of any steps have handrails. Any raised decks and porches should have guardrails on the edges. Have any leaves, snow, or ice cleared regularly. Use sand or salt on walking paths during winter. Clean up any spills in your garage right away. This includes oil or grease spills. What can I do in the bathroom? Use night lights. Install grab bars by the toilet and in the tub and shower. Do not use towel bars as grab bars. Use non-skid mats or decals in the tub or shower. If you need to sit down in the shower, use a plastic, non-slip  stool. Keep the floor dry. Clean up any water that spills on the floor as soon as it happens. Remove soap buildup in the tub or shower regularly. Attach bath mats securely with double-sided non-slip rug tape. Do not have throw rugs and other things on the floor that can make you trip. What can I do in the bedroom? Use night lights. Make sure that you have a light by your bed that is easy to reach. Do not use any sheets or blankets that are too big for your bed. They should not hang down onto the floor. Have a firm chair that has side arms. You can use this for support while you get dressed. Do not have throw rugs and other things on the floor that can make you trip. What can I do in the kitchen? Clean up any spills right away. Avoid walking on wet floors. Keep items that you use a lot in easy-to-reach places. If you need to reach something above you, use a strong step stool that has a grab bar. Keep electrical cords out of the way. Do not use floor polish or wax that makes floors slippery. If you must use wax, use non-skid floor wax. Do not have throw rugs and other things on the floor that can make you trip. What can I do with my stairs? Do not leave any items on the stairs. Make sure that  there are handrails on both sides of the stairs and use them. Fix handrails that are broken or loose. Make sure that handrails are as long as the stairways. Check any carpeting to make sure that it is firmly attached to the stairs. Fix any carpet that is loose or worn. Avoid having throw rugs at the top or bottom of the stairs. If you do have throw rugs, attach them to the floor with carpet tape. Make sure that you have a light switch at the top of the stairs and the bottom of the stairs. If you do not have them, ask someone to add them for you. What else can I do to help prevent falls? Wear shoes that: Do not have high heels. Have rubber bottoms. Are comfortable and fit you well. Are closed at the  toe. Do not wear sandals. If you use a stepladder: Make sure that it is fully opened. Do not climb a closed stepladder. Make sure that both sides of the stepladder are locked into place. Ask someone to hold it for you, if possible. Clearly mark and make sure that you can see: Any grab bars or handrails. First and last steps. Where the edge of each step is. Use tools that help you move around (mobility aids) if they are needed. These include: Canes. Walkers. Scooters. Crutches. Turn on the lights when you go into a dark area. Replace any light bulbs as soon as they burn out. Set up your furniture so you have a clear path. Avoid moving your furniture around. If any of your floors are uneven, fix them. If there are any pets around you, be aware of where they are. Review your medicines with your doctor. Some medicines can make you feel dizzy. This can increase your chance of falling. Ask your doctor what other things that you can do to help prevent falls. This information is not intended to replace advice given to you by your health care provider. Make sure you discuss any questions you have with your health care provider. Document Released: 11/18/2008 Document Revised: 06/30/2015 Document Reviewed: 02/26/2014 Elsevier Interactive Patient Education  2017 [employer].

## 2021-01-31 NOTE — Progress Notes (Signed)
Subjective:   [name] is a 80 y.o. male who presents for Medicare Annual/Subsequent preventive examination. Virtual Visit via Telephone Note  I connected with  [name] on 01/31/21 at  1:00 PM EST by telephone and verified that I am speaking with the correct person using two identifiers.  Location: Patient: HOME Provider: [facility] Persons participating in the virtual visit: patient/Nurse Health Advisor   I discussed the limitations, risks, security and privacy concerns of performing an evaluation and management service by telephone and the availability of in person appointments. The patient expressed understanding and agreed to proceed.  Interactive audio and video telecommunications were attempted between this nurse and patient, however failed, due to patient having technical difficulties OR patient did not have access to video capability.  We continued and completed visit with audio only.  Some vital signs may be absent or patient reported.   Chriss Driver, LPN  Review of Systems     Cardiac Risk Factors include: advanced age (>75mn, >>60women);hypertension;sedentary lifestyle;obesity (BMI >30kg/m2);male gender     Objective:    Today's Vitals   01/31/21 1256  Weight: 300 lb (136.1 kg)  Height: 6' 1"  (1.854 m)   Body mass index is 39.58 kg/m.  Advanced Directives 01/31/2021 04/01/2020 08/17/2019 08/16/2019 04/22/2019 04/09/2019 04/08/2019  Does Patient Have a Medical Advance Directive? Yes Yes No No No No No  Type of AParamedicof [place] will [place] will - - - - -  Does patient want to make changes to medical advance directive? - - - - - - -  Copy of HColoniain Chart? No - copy requested - - - - - -  Would patient like information on creating a medical advance directive? - - No - Patient declined - No - Patient declined No - Patient declined -  Pre-existing out of facility DNR  order (yellow form or pink MOST form) - - - - - - -    Current Medications (verified) Outpatient Encounter Medications as of 01/31/2021  Medication Sig   acetaminophen (TYLENOL) 500 MG tablet Take 2 tablets (1,000 mg total) by mouth every 6 (six) hours as needed for headache (pain).   albuterol (VENTOLIN HFA) 108 (90 Base) MCG/ACT inhaler Inhale 2 puffs into the lungs every 6 (six) hours as needed for wheezing or shortness of breath.   celecoxib (CELEBREX) 100 MG capsule Take 100 mg by mouth every morning.   furosemide (LASIX) 40 MG tablet ONE TO ONE AND A HALF TABLET EACH MORNING AS NEEDED (Patient taking differently: Take 40 mg by mouth every morning.)   ketoconazole (NIZORAL) 2 % cream APPLY TOPICALLY TWICE DAILY. MIX WITH BARRIER CREAM SUCH AS DESITIN   loperamide (IMODIUM) 2 MG capsule Take 2-4 mg by mouth See admin instructions. Take 2 capsules (4 mg) by mouth every morning and night, may also take one capsule (2 mg) during the day or night as needed for loose stools   mesalamine (LIALDA) 1.2 g EC tablet Take 2.4 g by mouth 2 (two) times daily.   metoprolol tartrate (LOPRESSOR) 50 MG tablet Take 0.5 tablets (25 mg total) by mouth every evening.   Multiple Vitamin (MULTIVITAMIN WITH MINERALS) TABS tablet Take 1 tablet by mouth every morning. Silver   mupirocin ointment (BACTROBAN) 2 % Apply 1 application topically 2 (two) times daily as needed (wound care/raw areas).   nitroGLYCERIN (NITROSTAT) 0.4 MG SL tablet ONE TABLET UNDER TONGUE AS  NEEDED FOR CHEST PAIN EVERY 5 MINUTES AS DIRECTED (Patient taking differently: Place 0.4 mg under the tongue every 5 (five) minutes as needed for chest pain.)   nystatin cream (MYCOSTATIN) Apply 1 application topically 2 (two) times daily as needed (rash/itching).   pravastatin (PRAVACHOL) 40 MG tablet Take 40 mg by mouth daily as needed.   Probiotic Product (ALIGN PO) Take 1 tablet by mouth every morning.   sertraline (ZOLOFT) 100 MG tablet TAKE 2 TABLETS  BY MOUTH DAILY (Patient taking differently: Take 200 mg by mouth every morning.)   traMADol (ULTRAM) 50 MG tablet Take 1 tablet (50 mg total) by mouth every 6 (six) hours as needed for severe pain.   Vitamin D, Ergocalciferol, (DRISDOL) 1.25 MG (50000 UNIT) CAPS capsule TAKE 1 CAPSULE BY MOUTH EVERY 7 DAYS   No facility-administered encounter medications on file as of 01/31/2021.    Allergies (verified) Patient has no known allergies.   History: Past Medical History:  Diagnosis Date   Ankylosing spondylitis ([place]) 11/07/2018   Asthma    BPH (benign prostatic hyperplasia)    CAD (coronary artery disease) 01/1995   CHF (congestive heart failure) ([name])    Clostridium difficile colitis 04/2005   Colitis 2011   COPD (chronic obstructive pulmonary disease) ([name])    Depression    Diverticulosis    DJD (degenerative joint disease)    Gastric ulcer 04/17/10   Three 23m gastric ulcers, H.pylori serologies were negative   GERD (gastroesophageal reflux disease)    History of kidney stones    Hyperlipidemia    Hypertension    Idiopathic chronic inflammatory bowel disease 05/18/2010   left-sided UC   Kidney stone    Morbid obesity ([place] 03/12/2018   Obstructive sleep apnea    on Cpap   Reflux 02/1995   S/P endoscopy 07/24/10   retained gastric contents, benign bx   Past Surgical History:  Procedure Laterality Date   ANORECTAL MANOMETRY  2016   [facility]: concern for possible fissure. Noted pelvic floor dyssnyergy   BIOPSY  07/29/2017   Procedure: BIOPSY;  Surgeon: RDaneil Dolin MD;  [facility];  Service: Endoscopy;;  ascending and sigmoid colon   CARDIAC CATHETERIZATION     with stent   CARDIOVASCULAR STRESS TEST  07/21/2009   No scintigraphic evidence of inducible myocardial ischemia   CARPAL TUNNEL RELEASE Left 01/17/2017   Procedure: LEFT CARPAL TUNNEL RELEASE;  Surgeon: HCarole Civil MD;  Location: [facility];  Service: Orthopedics;  Laterality: Left;   CATARACT  EXTRACTION, BILATERAL Bilateral    CERVICAL SPINE SURGERY     C4-5   COLONOSCOPY  04/2005   granularity and friability erosions from rectum to 40cm. Bx infection vs IBD. C. Diff positive at the time.    COLONOSCOPY  05/2010   [facility]: left-sided UC, bx with no dysplasia, shallow diverticula   COLONOSCOPY [name] 11/07/2012   RNIO:[id number] status post segmental biopsy/Sigmoid colon polyps removed as described above. Procedure compromisd by technical difficulties. bx: Inflammation limited to sigmoid and rectum on pathology.   [facility] pro[facility] limited to sigmoid   COLONOSCOPY WITH PROPOFOL [name] 07/29/2017   diverticulosis in colon, three 4-6 mm polyps at splenic flexure and in cecum, one 10 mm polyp in rectum, abnormal rectum and sigmoid consistent with active UC   CORONARY STENT PLACEMENT  01/1995   ESOPHAGOGASTRODUODENOSCOPY  07/24/2010   [facility]:EXHBZJesophagus   ESOPHAGOGASTRODUODENOSCOPY  04/2014  Dr. Nyoka Cowden at [facility]: negative small bowel biopsies   FOOT SURGERY Bilateral two   KNEE SURGERY  two   POLYPECTOMY  07/29/2017   Procedure: POLYPECTOMY;  Surgeon: Daneil Dolin, MD;  [facility];  Service: Endoscopy;;  splenic flexure, ascending colon polyp;rectal   SHOULDER SURGERY  two   TONSILLECTOMY     TRANSTHORACIC ECHOCARDIOGRAM  03/23/2009   EF 60-65%, normal LV systolic function   ULNAR HEAD EXCISION Left 01/17/2017   Procedure: LEFT ULNAR HEAD RESECTION;  Surgeon: Carole Civil, MD;  Location: [facility];  Service: Orthopedics;  Laterality: Left;   Family History  Problem Relation Age of Onset   Lung cancer Mother    Cancer Mother        breast   Diabetes Mother    Stroke Father    Hypertension Father    Kidney failure Brother    Other Child        blood infection   Colon cancer Neg Hx    Social History   Socioeconomic History   Marital status: Widowed    Spouse name: Not on file   Number of  children: Not on file   Years of education: Not on file   Highest education level: Not on file  Occupational History   Occupation: maintenance    Comment: retired  Tobacco Use   Smoking status: Former    Packs/day: 1.00    Years: 20.00    Pack years: 20.00    Types: Cigarettes    Quit date: 09/30/1969    Years since quitting: 51.3   Smokeless tobacco: Never  Vaping Use   Vaping Use: Never used  Substance and Sexual Activity   Alcohol use: No   Drug use: No   Sexual activity: Yes    Partners: Female    Birth control/protection: None    Comment: spouse  Other Topics Concern   Not on file  Social History Narrative   Not on file   Social Determinants of Health   Financial Resource Strain: Low Risk    Difficulty of Paying Living Expenses: Not hard at all  Food Insecurity: No Food Insecurity   Worried About Charity fundraiser in the Last Year: Never true   [place] in the Last Year: Never true  Transportation Needs: No Transportation Needs   Lack of Transportation (Medical): No   Lack of Transportation (Non-Medical): No  Physical Activity: Inactive   Days of Exercise per Week: 0 days   Minutes of Exercise per Session: 0 min  Stress: Stress Concern Present   Feeling of Stress : To some extent  Social Connections: Moderately Integrated   Frequency of Communication with Friends and Family: More than three times a week   Frequency of Social Gatherings with Friends and Family: More than three times a week   Attends Religious Services: 1 to 4 times per year   Active Member of [employer] or Organizations: Yes   Attends Archivist Meetings: 1 to 4 times per year   Marital Status: Widowed    Tobacco Counseling Counseling given: Not Answered   Clinical Intake:  Pre-visit preparation completed: Yes  Pain : No/denies pain     BMI - recorded: 39.58 Nutritional Status: BMI > 30  Obese Nutritional Risks: None Diabetes: No  How often do you need to have  someone help you when you read instructions, pamphlets, or other written materials from your doctor or pharmacy?: 1 - Never  Diabetic?NO  Interpreter Needed?: No  Information entered by :: MJ Jessica Checketts, LPN   Activities of Daily Living In your present state of health, do you have any difficulty performing the following activities: 01/31/2021  Hearing? Y  Vision? N  Difficulty concentrating or making decisions? Y  Walking or climbing stairs? Y  Dressing or bathing? Y  Doing errands, shopping? Y  Preparing Food and eating ? Y  Using the Toilet? N  In the past six months, have you accidently leaked urine? Y  Do you have problems with loss of bowel control? Y  Managing your Medications? Y  Managing your Finances? Y  Housekeeping or managing your Housekeeping? Y  Some recent data might be hidden    Patient Care Team: [name], MD as PCP - General (Family Medicine) Lorretta Harp, MD as PCP - Cardiology (Cardiology) Gala Romney Cristopher Estimable, MD as Consulting Physician (Gastroenterology) Beryle Lathe, [facility] (Pharmacist)  Indicate any recent Medical Services you may have received from other than [facility] providers in the past year (date may be approximate).     Assessment:   This is a routine wellness examination for [place].  Hearing/Vision screen Hearing Screening - Comments:: Wears hearing aids.  Vision Screening - Comments:: Glasses. [facility]. 2022.  Dietary issues and exercise activities discussed: Current Exercise Habits: The patient does not participate in regular exercise at present, Exercise limited by: cardiac condition(s);orthopedic condition(s);respiratory conditions(s);psychological condition(s)   Goals Addressed             This Visit's Progress    DIET - REDUCE CALORIE INTAKE       Would like to lose weight.       Depression Screen PHQ 2/9 Scores 01/31/2021 11/10/2020 09/25/2020 01/28/2020 01/19/2020 03/12/2018 12/10/2017  PHQ - 2 Score 0 0 6 2 6 6  6   PHQ- 9 Score - 0 24 8 17 27 18     Fall Risk Fall Risk  01/31/2021 11/10/2020 09/23/2020 06/23/2020 04/01/2020  Falls in the past year? 1 1 1  0 1  Number falls in past yr: 1 1 0 0 1  Injury with Fall? 1 1 0 0 0  Risk Factor Category  - - - - -  Risk for fall due to : History of fall(s);Impaired balance/gait;Impaired mobility History of fall(s) Impaired balance/gait;Impaired mobility - -  Follow up Falls prevention discussed Falls evaluation completed Falls evaluation completed Falls evaluation completed -    FALL RISK PREVENTION PERTAINING TO THE HOME:  Any stairs in or around the home? No  If so, are there any without handrails? No  Home free of loose throw rugs in walkways, pet beds, electrical cords, etc? Yes  Adequate lighting in your home to reduce risk of falls? Yes   ASSISTIVE DEVICES UTILIZED TO PREVENT FALLS:  Life alert? Yes  Use of a cane, walker or w/c? Yes  Grab bars in the bathroom? Yes  Shower chair or bench in shower? Yes  Elevated toilet seat or a handicapped toilet? Yes   TIMED UP AND GO:  Was the test performed? No .  Phone visit.  Cognitive Function:     6CIT Screen 01/31/2021  What Year? 0 points  What month? 0 points  What time? 0 points  Count back from 20 2 points  Months in reverse 4 points  Repeat phrase 0 points  Total Score 6    Immunizations Immunization History  Administered Date(s) Administered   Fluad Quad(high Dose 65+) 11/10/2020   Influenza  Split 11/18/2012   Influenza, High Dose Seasonal PF 11/06/2019   Influenza,inj,Quad PF,6+ Mos 12/03/2013, 11/18/2014, 10/15/2016, 11/05/2017   Influenza-Unspecified 11/17/2015   PFIZER(Purple Top)SARS-COV-2 Vaccination 03/21/2019, 07/05/2019   Pneumococcal Conjugate-13 11/18/2014   Pneumococcal Polysaccharide-23 11/06/2018   Pneumococcal-Unspecified 07/05/2009   Td 07/05/2009   Zoster Recombinat (Shingrix) 01/19/2019    TDAP status: Due, Education has been provided regarding the  importance of this vaccine. Advised may receive this vaccine at local pharmacy or Health Dept. Aware to provide a copy of the vaccination record if obtained from local pharmacy or Health Dept. Verbalized acceptance and understanding.  Flu Vaccine status: Up to date  Pneumococcal vaccine status: Up to date  Covid-19 vaccine status: Completed vaccines  Qualifies for Shingles Vaccine? Yes   Zostavax completed No   Shingrix Completed?: No.    Education has been provided regarding the importance of this vaccine. Patient has been advised to call insurance company to determine out of pocket expense if they have not yet received this vaccine. Advised may also receive vaccine at local pharmacy or Health Dept. Verbalized acceptance and understanding.  Screening Tests Health Maintenance  Topic Date Due   Zoster Vaccines- Shingrix (2 of 2) 03/16/2019   TETANUS/TDAP  07/06/2019   COVID-19 Vaccine (3 - Pfizer risk series) 08/02/2019   Pneumonia Vaccine 60+ Years old  Completed   INFLUENZA VACCINE  Completed   HPV VACCINES  Aged Out    Health Maintenance  Health Maintenance Due  Topic Date Due   Zoster Vaccines- Shingrix (2 of 2) 03/16/2019   TETANUS/TDAP  07/06/2019   COVID-19 Vaccine (3 - Pfizer risk series) 08/02/2019    Colorectal cancer screening: Type of screening: Colonoscopy. Completed 07/29/2017. Repeat every 7 years  Lung Cancer Screening: (Low Dose CT Chest recommended if Age 65-80 years, 30 pack-year currently smoking OR have quit w/in 15years.) does not qualify.    Additional Screening:  Hepatitis C Screening: does not qualify.  Vision Screening: Recommended annual ophthalmology exams for early detection of glaucoma and other disorders of the eye. Is the patient up to date with their annual eye exam?  Yes  Who is the provider or what is the name of the office in which the patient attends annual eye exams? [facility] If pt is not established with a provider, would they like  to be referred to a provider to establish care? No .   Dental Screening: Recommended annual dental exams for proper oral hygiene  Community Resource Referral / Chronic Care Management: CRR required this visit?  No   CCM required this visit?  No      Plan:     I have personally reviewed and noted the following in the patients chart:   Medical and social history Use of alcohol, tobacco or illicit drugs  Current medications and supplements including opioid prescriptions. Patient is not currently taking opioid prescriptions. Functional ability and status Nutritional status Physical activity Advanced directives List of other physicians Hospitalizations, surgeries, and ER visits in previous 12 months Vitals Screenings to include cognitive, depression, and falls Referrals and appointments  In addition, I have reviewed and discussed with patient certain preventive protocols, quality metrics, and best practice recommendations. A written personalized care plan for preventive services as well as general preventive health recommendations were provided to patient.     Chriss Driver, LPN   50/10/3816   Nurse Notes: Visit completed with patient and patient's daughter, [name].Pt is up to date on all age appropriate  health maintenance. Discussed Shingrix and how to obtain 2nd dose. 6CIT score of 12.  PHONE VISIT. PT AT HOME, NURSE AT [facility].

## 2021-01-31 NOTE — Telephone Encounter (Signed)
Called pt for AWV and spoke with patient and daughter, [name]. [name] is very concerned because her Dad wants to sleep all the time and she feels like he is depressed. When I asked the patient, he states he is not depressed. Do they need an appointment with you to discuss?  Angie also asks if there is a mental health therapist that you know of that will do home visits?

## 2021-01-31 NOTE — Telephone Encounter (Signed)
Nurses I do not know of any therapist that does home visits Some of the mental health counseling we will do this via video May have referral if family desires this Also would recommend a standard office visit with myself if this needs to be virtual that would be fine (My schedule this week would not allow for additional regular visits this would have to be scheduled based upon my upcoming schedule over the next few weeks)

## 2021-01-31 NOTE — Telephone Encounter (Signed)
Left message to return call 

## 2021-02-03 ENCOUNTER — Ambulatory Visit (INDEPENDENT_AMBULATORY_CARE_PROVIDER_SITE_OTHER): Payer: Medicare Other | Admitting: Pharmacist

## 2021-02-03 DIAGNOSIS — I5032 Chronic diastolic (congestive) heart failure: Secondary | ICD-10-CM

## 2021-02-03 DIAGNOSIS — J449 Chronic obstructive pulmonary disease, unspecified: Secondary | ICD-10-CM

## 2021-02-03 DIAGNOSIS — F321 Major depressive disorder, single episode, moderate: Secondary | ICD-10-CM

## 2021-02-03 DIAGNOSIS — E785 Hyperlipidemia, unspecified: Secondary | ICD-10-CM

## 2021-02-03 DIAGNOSIS — I2510 Atherosclerotic heart disease of native coronary artery without angina pectoris: Secondary | ICD-10-CM

## 2021-02-03 DIAGNOSIS — I10 Essential (primary) hypertension: Secondary | ICD-10-CM

## 2021-02-03 NOTE — Patient Instructions (Signed)
It was great to talk to you today!  Please call me with any questions or concerns.   Visit Information   Following is a copy of your full care plan:  Care Plan : Medication Management  Updates made by Beryle Lathe, [place] since 02/03/2021 12:00 AM     Problem: HTN, CHF, HLD, CAD, COPD, Depression   Priority: High  Onset Date: 02/03/2021     Long-Range Goal: Disease Progression Prevention   Start Date: 02/03/2021  Expected End Date: 05/04/2021  This Visit's Progress: On track  Priority: High  Note:   Current Barriers:  Unable to independently monitor therapeutic efficacy Unable to achieve control of hypertension  Pharmacist Clinical Goal(s):  Through collaboration with PharmD and provider, patient will  Achieve adherence to monitoring guidelines and medication adherence to achieve therapeutic efficacy Achieve control of hypertension as evidenced by improved blood pressure control   Interventions: 1:1 collaboration with [name], MD regarding development and update of comprehensive plan of care as evidenced by provider attestation and co-signature Inter-disciplinary care team collaboration (see longitudinal plan of care) Comprehensive medication review performed; medication list updated in electronic medical record  Hyperlipidemia/coronary artery disease - New goal.: Controlled. LDL at goal of <70 due to very high risk given 10-year risk >20% per 2020 AACE/ACE guidelines. Triglycerides at goal of <150 per 2020 AACE/ACE guidelines. Current medications: pravastatin 40 mg by mouth once daily Antiplatelet: aspirin 81 mg by mouth daily He also has nitroglycerin as needed for chest pain Intolerances: none Taking medications as directed: yes Side effects thought to be attributed to current medication regimen: no Encourage dietary reduction of high fat containing foods such as butter, nuts, bacon, egg yolks, etc. Continue current medications as  above  Hypertension/Heart failure with preserved ejection fraction (LVEF >50% with evidence of spontaneous or provokable increased LV filling pressures) - New goal.: Appropriately managed. Blood pressure control is up and down but likely will not treat aggressively given issues with dizziness/balance and falls Current treatment: metoprolol tartrate 25 mg by mouth every evening and furosemide 40-60 mg every morning Most recent echocardiogram: LVEF 60-65% (09/29/19) Current home blood pressure: not discussed today Current home weight: unknown Encourage dietary sodium restriction (<3 g/day) Educated on the importance of weighing daily. Patient aware to contact cardiology/primary care team if weight gain >3 lbs in 1 day or >5 lbs in 1 week Recommend home blood pressure monitoring, to bring results in next visit Continue current medications per PCP  Chronic Obstructive Pulmonary Disease - New goal.: Controlled per patient. Only has to use albuterol every couple days and mostly on exertion.  Current treatment: albuterol metered dose (ProAir, Ventolin, Proventil) 2 puffs by mouth every 6 hours as needed for shortness of breath or wheezing GOLD Classification: unknown Most recent Pulmonary Function Testing: unknown 0 exacerbations requiring treatment in the last 6 months  Current oxygen requirements: 0 L Continue albuterol metered dose (ProAir, Ventolin, Proventil) 2 puffs by mouth every 6 hours as needed for shortness of breath or wheezing Consider maintenance inhaler with LAMA or LABA if breathing worsens or if patient needs to use albuterol more often  Depression - New goal.: Unable to determine level of control today. Patient reports he does not feel depressed but daughter thinks his depression is worsening. Patient apparently has been sleeping more than normal lately. Current medications: sertraline 200 mg by mouth daily Continue current medications per primary care provider. Appointment with  PCP to further discuss in 2 weeks.   Patient  Goals/Self-Care Activities Patient will:  Take medications as prescribed Check blood pressure at least once daily, document, and provide at future appointments Weigh daily, and contact provider if weight gain of more than 3 lbs in 1 day or more than 5 lbs in 1 week  Follow Up Plan: Telephone follow up appointment with care management team member scheduled for: 05/05/21      Consent to CCM Services: Mr. Vondrak was given information about Chronic Care Management services including:  CCM service includes personalized support from designated clinical staff supervised by his physician, including individualized plan of care and coordination with other care providers 24/7 contact phone numbers for assistance for urgent and routine care needs. Service will only be billed when office clinical staff spend 20 minutes or more in a month to coordinate care. Only one practitioner may furnish and bill the service in a calendar month. The patient may stop CCM services at any time (effective at the end of the month) by phone call to the office staff. The patient will be responsible for cost sharing (co-pay) of up to 20% of the service fee (after annual deductible is met).  Patient agreed to services and verbal consent obtained.   Plan: Telephone follow up appointment with care management team member scheduled for:  05/05/21  Kennon Holter, PharmD, BCACP, CPP Clinical Pharmacist Practitioner [place] [phone]  Please call the care guide team at [phone] if you need to cancel or reschedule your appointment.   Patient verbalizes understanding of instructions provided today and agrees to view in [place].

## 2021-02-03 NOTE — Chronic Care Management (AMB) (Signed)
Chronic Care Management Pharmacy Note  02/03/2021 Name:  [name] MRN:  [id number] DOB:  29-Aug-1940  Summary: Depression Unable to determine level of control today. Patient reports he does not feel depressed but daughter thinks his depression is worsening. Patient apparently has been sleeping more than normal lately. Current medications: sertraline 200 mg by mouth daily Continue current medications per primary care provider. Appointment with PCP to further discuss in 2 weeks.   Hypertension/Heart failure with preserved ejection fraction Appropriately managed. Blood pressure control is up and down but likely will not treat aggressively given issues with dizziness/balance and falls Current treatment: metoprolol tartrate 25 mg by mouth every evening and furosemide 40-60 mg every morning  Chronic Obstructive Pulmonary Disease Consider maintenance inhaler with LAMA or LABA if breathing worsens or if patient needs to use albuterol more often  Subjective: [name] is an 80 y.o. year old male who is a primary patient of Luking, Elayne Snare, MD.  The CCM team was consulted for assistance with disease management and care coordination needs.    Engaged with patient's HCPOA (daughter [name]) by telephone for initial visit in response to provider referral for pharmacy case management and/or care coordination services.   Consent to Services:  The patient was given the following information about Chronic Care Management services today, agreed to services, and gave verbal consent: 1. CCM service includes personalized support from designated clinical staff supervised by the primary care provider, including individualized plan of care and coordination with other care providers 2. 24/7 contact phone numbers for assistance for urgent and routine care needs. 3. Service will only be billed when office clinical staff spend 20 minutes or more in a month to coordinate care. 4. Only one practitioner  may furnish and bill the service in a calendar month. 5.The patient may stop CCM services at any time (effective at the end of the month) by phone call to the office staff. 6. The patient will be responsible for cost sharing (co-pay) of up to 20% of the service fee (after annual deductible is met). Patient agreed to services and consent obtained.  Patient Care Team: [name], MD as PCP - General (Family Medicine) Lorretta Harp, MD as PCP - Cardiology (Cardiology) Gala Romney Cristopher Estimable, MD as Consulting Physician (Gastroenterology) Beryle Lathe, [facility] (Pharmacist)  Objective:  Lab Results  Component Value Date   CREATININE 1.06 11/10/2020   CREATININE 1.07 10/30/2020   CREATININE 1.31 (H) 10/29/2020    Lab Results  Component Value Date   HGBA1C 6.1 (H) 06/23/2020   Last diabetic Eye exam: No results found for: HMDIABEYEEXA  Last diabetic Foot exam: No results found for: HMDIABFOOTEX      Component Value Date/Time   CHOL 137 11/07/2018 1140   TRIG 107 04/08/2019 2249   HDL 49 11/07/2018 1140   CHOLHDL 2.8 11/07/2018 1140   CHOLHDL 3.2 06/08/2015 0907   VLDL 33 (H) 06/08/2015 0907   LDLCALC 66 11/07/2018 1140    Hepatic Function Latest Ref Rng & Units 10/28/2020 06/23/2020 12/07/2019  Total Protein 6.5 - 8.1 g/dL 7.1 6.6 7.0  Albumin 3.5 - 5.0 g/dL 3.4(L) 3.8 3.9  AST 15 - 41 U/L 29 21 13   ALT 0 - 44 U/L 17 14 10   Alk Phosphatase 38 - 126 U/L 83 84 83  Total Bilirubin 0.3 - 1.2 mg/dL 0.5 0.4 0.5  Bilirubin, Direct 0.00 - 0.40 mg/dL - - 0.17    Lab Results  Component  Value Date/Time   TSH 3.680 11/10/2020 12:23 PM   TSH 5.740 (H) 06/23/2020 10:46 AM   FREET4 1.13 06/23/2020 10:46 AM    CBC Latest Ref Rng & Units 10/29/2020 10/28/2020 10/28/2020  WBC 4.0 - 10.5 K/uL 11.7(H) - 10.1  Hemoglobin 13.0 - 17.0 g/dL 13.4 14.6 14.3  Hematocrit 39.0 - 52.0 % 43.5 43.0 44.8  Platelets 150 - 400 K/uL 193 - 207    Lab Results  Component Value Date/Time   VD25OH 23.5  (L) 06/23/2020 10:46 AM   VD25OH 30.6 12/07/2019 03:00 PM    Clinical ASCVD: No  The ASCVD Risk score (Arnett DK, et al., 2019) failed to calculate for the following reasons:   The 2019 ASCVD risk score is only valid for ages 79 to 59    Social History   Tobacco Use  Smoking Status Former   Packs/day: 1.00   Years: 20.00   Pack years: 20.00   Types: Cigarettes   Quit date: 09/30/1969   Years since quitting: 51.3  Smokeless Tobacco Never   BP Readings from Last 3 Encounters:  11/10/20 (!) 160/74  10/30/20 125/62  09/23/20 138/62   Pulse Readings from Last 3 Encounters:  11/10/20 (!) 58  10/30/20 77  06/23/20 73   Wt Readings from Last 3 Encounters:  01/31/21 300 lb (136.1 kg)  11/10/20 297 lb (134.7 kg)  10/28/20 295 lb 3.2 oz (133.9 kg)    Assessment: Review of patient past medical history, allergies, medications, health status, including review of consultants reports, laboratory and other test data, was performed as part of comprehensive evaluation and provision of chronic care management services.   SDOH:  (Social Determinants of Health) assessments and interventions performed:    CCM Care Plan  No Known Allergies  Medications Reviewed Today     Reviewed by Beryle Lathe, [facility] (Pharmacist) on 02/03/21 at [phone]  Med List Status: <None>   Medication Order Taking? Sig Documenting Provider Last Dose Status Informant  acetaminophen (TYLENOL) 500 MG tablet [id number] Yes Take 2 tablets (1,000 mg total) by mouth every 6 (six) hours as needed for headache (pain). [name], MD Taking Active   albuterol (VENTOLIN HFA) 108 (90 Base) MCG/ACT inhaler [id number] Yes Inhale 2 puffs into the lungs every 6 (six) hours as needed for wheezing or shortness of breath. Nilda Simmer, NP Taking Active Multiple Informants  aspirin EC 81 MG tablet [id number] Yes Take 81 mg by mouth daily. Swallow whole. [provider] Taking Active Family Member  celecoxib  (CELEBREX) 100 MG capsule [id number] Yes Take 100 mg by mouth every morning. [provider] Taking Active Multiple Informants  furosemide (LASIX) 40 MG tablet [id number] Yes ONE TO ONE AND A HALF TABLET EACH MORNING AS NEEDED  Patient taking differently: Take 60 mg by mouth every morning.   [name], MD Taking Active Self  ketoconazole (NIZORAL) 2 % cream [id number] Yes APPLY TOPICALLY TWICE DAILY. MIX WITH BARRIER CREAM SUCH AS DESITIN [name], MD Taking Active   loperamide (IMODIUM) 2 MG capsule [id number] Yes Take 2-4 mg by mouth See admin instructions. Take 2 capsules (4 mg) by mouth every morning and night, may also take one capsule (2 mg) during the day or night as needed for loose stools [provider] Taking Active Multiple Informants  mesalamine (LIALDA) 1.2 g EC tablet [id number] Yes Take 2.4 g by mouth 2 (two) times daily. [provider] Taking Active Multiple Informants  metoprolol tartrate (LOPRESSOR)  50 MG tablet [id number] Yes Take 0.5 tablets (25 mg total) by mouth every evening. [name], MD Taking Active   Multiple Vitamin (MULTIVITAMIN WITH MINERALS) TABS tablet [id number] Yes Take 1 tablet by mouth every morning. Silver [provider] Taking Active Multiple Informants  mupirocin ointment (BACTROBAN) 2 % [id number] Yes Apply 1 application topically 2 (two) times daily as needed (wound care/raw areas). [name], MD Taking Active   nitroGLYCERIN (NITROSTAT) 0.4 MG SL tablet [id number] No ONE TABLET UNDER TONGUE AS NEEDED FOR CHEST PAIN EVERY 5 MINUTES AS DIRECTED  Patient not taking: Reported on 02/03/2021   Lorretta Harp, MD Not Taking Active Multiple Informants  nystatin cream (MYCOSTATIN) [id number] Yes Apply 1 application topically 2 (two) times daily as needed (rash/itching). [name], MD Taking Active   pravastatin (PRAVACHOL) 40 MG tablet [id number] Yes Take 40 mg by mouth daily. [provider] Taking  Active   Probiotic Product (ALIGN PO) [id number] Yes Take 1 tablet by mouth every morning. [provider] Taking Active Child  pyridOXINE (VITAMIN B-6) 100 MG tablet [id number] Yes Take 100 mg by mouth daily. [provider] Taking Active Family Member  sertraline (ZOLOFT) 100 MG tablet [id number] Yes TAKE 2 TABLETS BY MOUTH DAILY  Patient taking differently: Take 200 mg by mouth every morning.   [name], MD Taking Active Multiple Informants  traMADol (ULTRAM) 50 MG tablet [id number] No Take 1 tablet (50 mg total) by mouth every 6 (six) hours as needed for severe pain.  Patient not taking: Reported on 02/03/2021   Arrien, Jimmy Picket, MD Not Taking Active   vitamin C (ASCORBIC ACID) 500 MG tablet [id number] Yes Take 500 mg by mouth daily. [provider] Taking Active Family Member  Vitamin D, Ergocalciferol, (DRISDOL) 1.25 MG (50000 UNIT) CAPS capsule [id number] Yes TAKE 1 CAPSULE BY MOUTH EVERY 7 DAYS Luking, Elayne Snare, MD Taking Active   Med List Note Victoriano Lain, CPhT 04/08/19 2138): Angie helps with meds            Patient Active Problem List   Diagnosis Date Noted   Hematochezia 01/26/2021   Syncope 10/29/2020   Class 3 obesity ([place]) 10/29/2020   Head concussion 10/29/2020   Bilateral hand fractures 10/29/2020   Dysphagia 07/19/2020   Status post cervical spinal fusion 07/19/2020   Cervical spondylosis 03/07/2020   DNR (do not resuscitate) 08/17/2019   Shortness of breath 04/09/2019   COPD (chronic obstructive pulmonary disease) ([place]) 04/09/2019   Chronic ulcerative proctosigmoiditis, without complications ([place]) 36/14/4315   Chronic respiratory failure with hypoxia ([place]) 04/09/2019   Obesity with body mass index (BMI) of 30.0 to 39.9 04/09/2019   Acute on chronic respiratory failure with hypoxia and hypercapnia ([place]) 12/23/2018   Acute respiratory failure with hypoxia ([place]) 12/21/2018   Ankylosing spondylitis ([place]) 11/07/2018    Disseminated idiopathic skeletal hyperostosis 10/22/2018   Neck pain 10/22/2018   Rectal bleeding 08/26/2018   Chronic diastolic CHF (congestive heart failure) ([place]) 03/27/2018   Exertional dyspnea 03/15/2018   Morbid obesity ([place]) 03/12/2018   History of carpal tunnel surgery of left wrist with unlar head resection 01/17/17 01/24/2017   Closed fracture of distal radius and ulna, left, with malunion, subsequent encounter    Carpal tunnel syndrome of left wrist    Fracture, [place], left, closed 11/29/2016   Hx of skin cancer, basal cell 11/30/2015   Erectile dysfunction 06/06/2015   Iron deficiency 08/18/2014   Fecal incontinence 02/10/2014  Essential hypertension 01/09/2013   Dyslipidemia, goal LDL below 70 01/09/2013   Chest pain at rest 12/14/2012   CAD S/P percutaneous coronary angioplasty 12/14/2012   Hiatal hernia 12/14/2012   GERD (gastroesophageal reflux disease) 12/14/2012   Prediabetes 12/09/2012   BPH (benign prostatic hyperplasia) 11/18/2012   RUQ pain 09/04/2012   Gallstones 05/09/2012   Obstructive sleep apnea 04/30/2012   Ulcerative colitis ([place]) 04/29/2012   Ulcerative colitis, left sided ([place]) 12/26/2011   Diarrhea 10/18/2010    Immunization History  Administered Date(s) Administered   Fluad Quad(high Dose 65+) 11/10/2020   Influenza Split 11/18/2012   Influenza, High Dose Seasonal PF 11/06/2019   Influenza,inj,Quad PF,6+ Mos 12/03/2013, 11/18/2014, 10/15/2016, 11/05/2017   Influenza-Unspecified 11/17/2015   PFIZER(Purple Top)SARS-COV-2 Vaccination 03/21/2019, 07/05/2019   Pneumococcal Conjugate-13 11/18/2014   Pneumococcal Polysaccharide-23 11/06/2018   Pneumococcal-Unspecified 07/05/2009   Td 07/05/2009   Zoster Recombinat (Shingrix) 01/19/2019    Conditions to be addressed/monitored: CHF, CAD, HTN, HLD, and COPD  Care Plan : Medication Management  Updates made by Beryle Lathe, [place] since 02/03/2021 12:00 AM     Problem: HTN, CHF, HLD,  CAD, COPD, Depression   Priority: High  Onset Date: 02/03/2021     Long-Range Goal: Disease Progression Prevention   Start Date: 02/03/2021  Expected End Date: 05/04/2021  This Visit's Progress: On track  Priority: High  Note:   Current Barriers:  Unable to independently monitor therapeutic efficacy Unable to achieve control of hypertension  Pharmacist Clinical Goal(s):  Through collaboration with PharmD and provider, patient will  Achieve adherence to monitoring guidelines and medication adherence to achieve therapeutic efficacy Achieve control of hypertension as evidenced by improved blood pressure control   Interventions: 1:1 collaboration with [name], MD regarding development and update of comprehensive plan of care as evidenced by provider attestation and co-signature Inter-disciplinary care team collaboration (see longitudinal plan of care) Comprehensive medication review performed; medication list updated in electronic medical record  Hyperlipidemia/coronary artery disease - New goal.: Controlled. LDL at goal of <70 due to very high risk given 10-year risk >20% per 2020 AACE/ACE guidelines. Triglycerides at goal of <150 per 2020 AACE/ACE guidelines. Current medications: pravastatin 40 mg by mouth once daily Antiplatelet: aspirin 81 mg by mouth daily He also has nitroglycerin as needed for chest pain Intolerances: none Taking medications as directed: yes Side effects thought to be attributed to current medication regimen: no Encourage dietary reduction of high fat containing foods such as butter, nuts, bacon, egg yolks, etc. Continue current medications as above  Hypertension/Heart failure with preserved ejection fraction (LVEF >50% with evidence of spontaneous or provokable increased LV filling pressures) - New goal.: Appropriately managed. Blood pressure control is up and down but likely will not treat aggressively given issues with dizziness/balance and  falls Current treatment: metoprolol tartrate 25 mg by mouth every evening and furosemide 40-60 mg every morning Most recent echocardiogram: LVEF 60-65% (09/29/19) Current home blood pressure: not discussed today Current home weight: unknown Encourage dietary sodium restriction (<3 g/day) Educated on the importance of weighing daily. Patient aware to contact cardiology/primary care team if weight gain >3 lbs in 1 day or >5 lbs in 1 week Recommend home blood pressure monitoring, to bring results in next visit Continue current medications per PCP  Chronic Obstructive Pulmonary Disease - New goal.: Controlled per patient. Only has to use albuterol every couple days and mostly on exertion.  Current treatment: albuterol metered dose (ProAir, Ventolin, Proventil) 2 puffs by mouth every 6  hours as needed for shortness of breath or wheezing GOLD Classification: unknown Most recent Pulmonary Function Testing: unknown 0 exacerbations requiring treatment in the last 6 months  Current oxygen requirements: 0 L Continue albuterol metered dose (ProAir, Ventolin, Proventil) 2 puffs by mouth every 6 hours as needed for shortness of breath or wheezing Consider maintenance inhaler with LAMA or LABA if breathing worsens or if patient needs to use albuterol more often  Depression - New goal.: Unable to determine level of control today. Patient reports he does not feel depressed but daughter thinks his depression is worsening. Patient apparently has been sleeping more than normal lately. Current medications: sertraline 200 mg by mouth daily Continue current medications per primary care provider. Appointment with PCP to further discuss in 2 weeks.   Patient Goals/Self-Care Activities Patient will:  Take medications as prescribed Check blood pressure at least once daily, document, and provide at future appointments Weigh daily, and contact provider if weight gain of more than 3 lbs in 1 day or more than 5 lbs in  1 week  Follow Up Plan: Telephone follow up appointment with care management team member scheduled for: 05/05/21      Medication Assistance: None required.  Patient affirms current coverage meets needs.  Patient's preferred pharmacy is:  [id number] [address], [place] [address] Phone: [phone] Fax: [phone]  Follow Up:  Patient agrees to Care Plan and Follow-up.  Plan: Telephone follow up appointment with care management team member scheduled for:  05/05/21  Kennon Holter, PharmD, BCACP, CPP Clinical Pharmacist Practitioner [place] [phone]

## 2021-02-04 DIAGNOSIS — E785 Hyperlipidemia, unspecified: Secondary | ICD-10-CM

## 2021-02-04 DIAGNOSIS — I5032 Chronic diastolic (congestive) heart failure: Secondary | ICD-10-CM | POA: Diagnosis not present

## 2021-02-04 DIAGNOSIS — Z9861 Coronary angioplasty status: Secondary | ICD-10-CM

## 2021-02-04 DIAGNOSIS — J449 Chronic obstructive pulmonary disease, unspecified: Secondary | ICD-10-CM

## 2021-02-04 DIAGNOSIS — I10 Essential (primary) hypertension: Secondary | ICD-10-CM

## 2021-02-04 DIAGNOSIS — F321 Major depressive disorder, single episode, moderate: Secondary | ICD-10-CM | POA: Diagnosis not present

## 2021-02-04 DIAGNOSIS — I2510 Atherosclerotic heart disease of native coronary artery without angina pectoris: Secondary | ICD-10-CM

## 2021-02-13 ENCOUNTER — Other Ambulatory Visit: Payer: Self-pay | Admitting: Family Medicine

## 2021-02-13 MED ORDER — PRAVASTATIN SODIUM 40 MG PO TABS: 40.0000 mg | ORAL_TABLET | Freq: Every day | ORAL | 5 refills | Status: DC

## 2021-02-13 NOTE — Telephone Encounter (Signed)
Patient is needing refill on pravastatin 40 mg completely out has appointment by phone on 02/16/21. [place]

## 2021-02-14 ENCOUNTER — Ambulatory Visit (INDEPENDENT_AMBULATORY_CARE_PROVIDER_SITE_OTHER): Payer: Medicare Other | Admitting: Podiatry

## 2021-02-14 ENCOUNTER — Other Ambulatory Visit: Payer: Self-pay

## 2021-02-14 ENCOUNTER — Encounter: Payer: Self-pay | Admitting: Podiatry

## 2021-02-14 DIAGNOSIS — M79676 Pain in unspecified toe(s): Secondary | ICD-10-CM

## 2021-02-14 DIAGNOSIS — B351 Tinea unguium: Secondary | ICD-10-CM

## 2021-02-14 DIAGNOSIS — R5381 Other malaise: Secondary | ICD-10-CM | POA: Diagnosis not present

## 2021-02-16 ENCOUNTER — Telehealth: Payer: Medicare Other | Admitting: Family Medicine

## 2021-02-19 DIAGNOSIS — R0902 Hypoxemia: Secondary | ICD-10-CM | POA: Diagnosis not present

## 2021-02-19 DIAGNOSIS — J9601 Acute respiratory failure with hypoxia: Secondary | ICD-10-CM | POA: Diagnosis not present

## 2021-02-19 DIAGNOSIS — J441 Chronic obstructive pulmonary disease with (acute) exacerbation: Secondary | ICD-10-CM | POA: Diagnosis not present

## 2021-02-19 NOTE — Progress Notes (Signed)
°  Subjective:  Patient ID: [name], male    DOB: 1940/02/16,  MRN: [id number]  [name] presents to clinic today for painful elongated mycotic toenails 1-5 bilaterally which are tender when wearing enclosed shoe gear. Pain is relieved with periodic professional debridement.  Patient notes no new pedal problems on today's visit.  PCP is [name], MD , and last visit was 11/10/2020.  No Known Allergies  Review of Systems: Negative except as noted in the HPI. Objective:   Constitutional [name] is a pleasant 81 y.o. Caucasian male, WD, WN in NAD. AAO x 3.   Vascular CFT <3 seconds b/l LE. Palpable DP/PT pulses b/l LE. Digital hair absent b/l. Skin temperature gradient WNL b/l. No pain with calf compression b/l. Trace edema noted b/l. No cyanosis or clubbing noted b/l LE.  Neurologic Normal speech. Oriented to person, place, and time. Protective sensation intact 5/5 intact bilaterally with 10g monofilament b/l.  Dermatologic Pedal integument with normal turgor, texture and tone b/l LE. No open wounds b/l. No interdigital macerations b/l. Toenails 2-5 bilaterally and R hallux elongated, thickened, discolored with subungual debris. +Tenderness with dorsal palpation of nailplates. No hyperkeratotic or porokeratotic lesions present. Anonychia noted L hallux. Nailbed(s) epithelialized.   Orthopedic: Muscle strength 5/5 to all lower extremity muscle groups bilaterally. No pain, crepitus or joint limitation noted with ROM bilateral LE. No gross bony deformities bilaterally. Utilizes rollator for ambulation assistance.   Radiographs: None  Last A1c:  Hemoglobin A1C Latest Ref Rng & Units 06/23/2020  HGBA1C 4.8 - 5.6 % 6.1(H)  Some recent data might be hidden   Assessment:   1. Pain due to onychomycosis of toenail    Plan:  Patient was evaluated and treated and all questions answered. Consent given for treatment as described below: -Mycotic toenails 2-5  bilaterally and R hallux were debrided in length and girth with sterile nail nippers and dremel without iatrogenic bleeding. -Patient/POA to call should there be question/concern in the interim.  Return in about 3 months (around 05/15/2021).  Marzetta Board, DPM

## 2021-02-20 ENCOUNTER — Other Ambulatory Visit: Payer: Self-pay

## 2021-02-20 ENCOUNTER — Ambulatory Visit (INDEPENDENT_AMBULATORY_CARE_PROVIDER_SITE_OTHER): Payer: Medicare Other | Admitting: Family Medicine

## 2021-02-20 DIAGNOSIS — J441 Chronic obstructive pulmonary disease with (acute) exacerbation: Secondary | ICD-10-CM

## 2021-02-20 DIAGNOSIS — I5032 Chronic diastolic (congestive) heart failure: Secondary | ICD-10-CM | POA: Diagnosis not present

## 2021-02-20 DIAGNOSIS — K5130 Ulcerative (chronic) rectosigmoiditis without complications: Secondary | ICD-10-CM | POA: Diagnosis not present

## 2021-02-20 DIAGNOSIS — F321 Major depressive disorder, single episode, moderate: Secondary | ICD-10-CM | POA: Diagnosis not present

## 2021-02-20 DIAGNOSIS — M450 Ankylosing spondylitis of multiple sites in spine: Secondary | ICD-10-CM | POA: Diagnosis not present

## 2021-02-20 DIAGNOSIS — J439 Emphysema, unspecified: Secondary | ICD-10-CM | POA: Diagnosis not present

## 2021-02-20 MED ORDER — KETOCONAZOLE 2 % EX CREA: TOPICAL_CREAM | CUTANEOUS | 4 refills | Status: DC

## 2021-02-20 MED ORDER — FLUCONAZOLE 200 MG PO TABS: 200.0000 mg | ORAL_TABLET | Freq: Every day | ORAL | 0 refills | Status: DC

## 2021-02-20 MED ORDER — ALBUTEROL SULFATE HFA 108 (90 BASE) MCG/ACT IN AERS: 2.0000 | INHALATION_SPRAY | Freq: Four times a day (QID) | RESPIRATORY_TRACT | 2 refills | Status: DC | PRN

## 2021-02-20 MED ORDER — BUDESONIDE-FORMOTEROL FUMARATE 80-4.5 MCG/ACT IN AERO: 2.0000 | INHALATION_SPRAY | Freq: Two times a day (BID) | RESPIRATORY_TRACT | 3 refills | Status: DC

## 2021-02-20 MED ORDER — VENLAFAXINE HCL ER 75 MG PO CP24: 75.0000 mg | ORAL_CAPSULE | Freq: Every day | ORAL | 5 refills | Status: DC

## 2021-02-20 NOTE — Progress Notes (Signed)
° °  Subjective:    Patient ID: [name], male    DOB: 12/07/1940, 81 y.o.   MRN: [id number]  HPI Patient had a fall on Saturday 2 days ago, EMS came out and evaluated patient , reported to have scraped his knee States depression med sertraline 200 mg not effective - phq9- 19 States copd flares daily , asks about a daily inhaler usage  Depression screen [facility] 2/9 02/20/2021 01/31/2021 11/10/2020 09/25/2020 01/28/2020  Decreased Interest 3 0 0 3 2  Down, Depressed, Hopeless 3 0 0 3 0  PHQ - 2 Score 6 0 0 6 2  Altered sleeping 3 - 0 3 2  Tired, decreased energy 3 - 0 3 2  Change in appetite 3 - 0 3 0  Feeling bad or failure about yourself  1 - 0 3 1  Trouble concentrating 2 - 0 3 0  Moving slowly or fidgety/restless 1 - 0 0 1  Suicidal thoughts 0 - 0 3 0  PHQ-9 Score 19 - 0 24 8  Difficult doing work/chores Very difficult - Not difficult at all Extremely dIfficult Somewhat difficult  Some recent data might be hidden   Depression, major, single episode, moderate ([name])  Ankylosing spondylitis of multiple sites in spine ([name]), Chronic  Pulmonary emphysema, unspecified emphysema type ([name]), Chronic  Chronic diastolic (congestive) heart failure ([name]), Chronic  Ulcerative (chronic) rectosigmoiditis without complications ([name]), Chronic  Chronic respiratory failure with hypoxia ([place]), Chronic - Plan: albuterol (VENTOLIN HFA) 108 (90 Base) MCG/ACT inhaler  Class 3 obesity ([name]), Chronic  Chronic respiratory failure with hypoxia ([name]) - Plan: albuterol (VENTOLIN HFA) 108 (90 Base) MCG/ACT inhaler  COPD with acute exacerbation ([name]) - Plan: albuterol (VENTOLIN HFA) 108 (90 Base) MCG/ACT inhaler   Review of Systems     Objective:   Physical Exam  Today's visit was via telephone Physical exam was not possible for this visit       Assessment & Plan:  1. Depression, major, single episode, moderate ([place]) I believe a lot of his depression is related into chronic health issues  and inability to get around limiting his social contacts.  But at the same time family states that he has very little interest in doing things.  Therefore stop sertraline.  Start Effexor 75 mg XR we will do a follow-up visit in 3 weeks time via phone and then in person visit in the spring  Also complicating things of sleep apnea he has an appointment tomorrow for that I believe if he gets this corrected it would actually help with his energy level  2. Ankylosing spondylitis of multiple sites in spine [facility]) Patient has chronic arthritic issues with this.  Stable.  3. Pulmonary emphysema, unspecified emphysema type ([place]) COPD problems.  Try Symbicort.  Twice daily.  Rescue inhaler as needed  4. Chronic diastolic (congestive) heart failure ([name]) Followed by cardiology stable currently  5. Ulcerative (chronic) rectosigmoiditis without complications ([place]) Followed by gastroenterology stable currently  6. Chronic respiratory failure with hypoxia ([name]) Patient was on oxygen for his COPD - albuterol (VENTOLIN HFA) 108 (90 Base) MCG/ACT inhaler; Inhale 2 puffs into the lungs every 6 (six) hours as needed for wheezing or shortness of breath.  Dispense: 18 g; Refill: 2  7. Class 3 obesity ([name]) Portion control recommended.  Patient morbidly obese.   Follow-up visit 3 weeks

## 2021-02-21 ENCOUNTER — Encounter: Payer: Self-pay | Admitting: Neurology

## 2021-02-21 ENCOUNTER — Institutional Professional Consult (permissible substitution): Payer: Medicare Other | Admitting: Neurology

## 2021-03-07 ENCOUNTER — Ambulatory Visit: Payer: Medicare Other | Admitting: Neurology

## 2021-03-07 ENCOUNTER — Encounter: Payer: Self-pay | Admitting: Neurology

## 2021-03-07 VITALS — BP 203/77 | HR 76 | Ht 73.0 in | Wt 302.8 lb

## 2021-03-07 DIAGNOSIS — J42 Unspecified chronic bronchitis: Secondary | ICD-10-CM

## 2021-03-07 DIAGNOSIS — Z9981 Dependence on supplemental oxygen: Secondary | ICD-10-CM | POA: Diagnosis not present

## 2021-03-07 DIAGNOSIS — G4719 Other hypersomnia: Secondary | ICD-10-CM | POA: Diagnosis not present

## 2021-03-07 DIAGNOSIS — Z9989 Dependence on other enabling machines and devices: Secondary | ICD-10-CM

## 2021-03-07 DIAGNOSIS — G4733 Obstructive sleep apnea (adult) (pediatric): Secondary | ICD-10-CM

## 2021-03-07 DIAGNOSIS — E669 Obesity, unspecified: Secondary | ICD-10-CM

## 2021-03-07 DIAGNOSIS — I2510 Atherosclerotic heart disease of native coronary artery without angina pectoris: Secondary | ICD-10-CM | POA: Diagnosis not present

## 2021-03-07 DIAGNOSIS — I5032 Chronic diastolic (congestive) heart failure: Secondary | ICD-10-CM

## 2021-03-07 NOTE — Patient Instructions (Addendum)
It was nice to meet you both today!   Here is what we discussed today and what we came up with as our plan for you:   Since you have COPD and requires supplemental oxygen, I recommend that you get established with a pulmonologist (ie a lung specialist physician).  Please talk to Dr. Wolfgang Phoenix about a referral if you currently do not see a pulmonologist.   Based on your symptoms and your exam I believe you have significant obstructive sleep apnea and would benefit from re-evaluation as it has been many years and you need new supplies and an updated machine. Therefore, I think we should proceed with a sleep study to determine how severe your sleep apnea is. If you have more than mild OSA, I want you to consider ongoing treatment with CPAP. Please remember, the risks and ramifications of moderate to severe obstructive sleep apnea or OSA are: Cardiovascular disease, including congestive heart failure, stroke, difficult to control hypertension, arrhythmias, and even type 2 diabetes has been linked to untreated OSA. Sleep apnea causes disruption of sleep and sleep deprivation in most cases, which, in turn, can cause recurrent headaches, problems with memory, mood, concentration, focus, and vigilance. Most people with untreated sleep apnea report excessive daytime sleepiness, which can affect their ability to drive. Please do not drive if you feel sleepy.   I will likely see you back after your sleep study to go over the test results and where to go from there. We will call you after your sleep study to advise about the results (most likely, you will hear from [place], my nurse) and to set up an appointment at the time, as necessary.    Our sleep lab administrative assistant will call you to schedule your sleep study. If you don't hear back from her by about 2 weeks from now, please feel free to call her at [phone]. You can leave a message with your phone number and concerns, if you get the voicemail box. She  will call back as soon as possible.

## 2021-03-07 NOTE — Progress Notes (Addendum)
Subjective:    Patient ID: [name] is a 81 y.o. male.  HPI    Chris Age, MD, PhD [name] [address] [place], [place] [address]  Dear Dr. Wolfgang Underwood,   I saw your patient, [name], upon your kind request in my sleep clinic today for initial consultation of his sleep disorder, in particular, concern for obstructive sleep apnea.  The patient is accompanied by his daughter, [name], today.   As you know, Mr. [name] is an 81 year old right-handed gentleman with an underlying complex medical history of reflux disease, ulcerative colitis, kidney stone, hypertension, degenerative disc disease, ankylosing spondylitis, status post neck fusion, asthma, BPH, coronary artery disease, congestive heart failure, COPD, hyperlipidemia, and severe obesity, who reports very little of his own history.  His history is provided by his daughter who reports significant daytime somnolence.  Since he was hospitalized last year he was placed on supplemental oxygen which he is supposed to wear 24-7 but he wears it only at night with his CPAP.  It is at 4 L/min.  He has difficulty with the mask and has to tighten it very high.  DME is [employer].  I reviewed your office note from 11/10/2020.  He was previously diagnosed with obstructive sleep apnea and placed on CPAP therapy.  Prior sleep study results are not available for my review today.  He also had sleep apnea surgery in 04-29-95 and a stent placed in 1995/04/29.  His Epworth sleepiness score is 20 out of 24, fatigue severity score is 63 out of 63.  Unfortunately, he sustained a fall with LOC and injuries in September 2022 and was hospitalized from 10/28/2020 through 10/30/2020. He sustained bilateral hand fractures, and a scalp hematoma, no intracranial or bony head injuries thankfully. He is on CPAP consistently.  Daughter reports that he sleeps longer hours and also during the day.  He recently was switched from  sertraline which was up to 200 mg daily to Effexor long-acting 75 mg once daily currently.  I was able to review his compliance data from 02/05/2021 to 03/06/2021, which is a total of 30 days, during which time he used his machine every night with percent use days greater than 4 hours at 100%, indicating superb compliance, extended use noted with an average of 10 hours and 28 minutes, residual AHI elevated at 20.8/h with no evidence of central apneas per download report.  He has an air sense 10 AutoSet machine set at a pressure of 15 cm without EPR, leak is exceedingly high consistently with a 95th percentile at 119.6 L/min.  He uses a fullface mask from [employer].  Daughter reports that she has been trying to get someone to help with the mask fit from the DME company but has not received any help lately.  He does not have a very set bedtime, spends long hours in bed, during the workweek, Monday through Friday they have an aide coming to the house and she wakes him up at 9 AM.  He limits his caffeine to 1 cup of coffee.  He drinks soda occasionally.  He has significant lower extremity swelling and needs to take Lasix every day, some days extra.  He does not drink any alcohol and quit smoking in 1969-04-28.  He does not currently see a pulmonologist for his COPD. He is retired, he worked for [name].  His wife passed away in 04-28-2017.  He lives with his daughter.  He had a  total of 3 daughters, 1 passed away in 04/19/2017.  He had tooth extractions recently, he is waiting to get dentures for the bottom jaw.  He no longer drives.  He uses a walker inside the home and outside.  He has seen Dr. Carles Underwood at [facility] neurology.  His Past Medical History Is Significant For: Past Medical History:  Diagnosis Date   Ankylosing spondylitis ([name]) 11/07/2018   Asthma    BPH (benign prostatic hyperplasia)    CAD (coronary artery disease) 01/1995   CHF (congestive heart failure) ([name])    Clostridium difficile colitis 04/2005   Colitis  04/19/2009   COPD (chronic obstructive pulmonary disease) ([name])    Depression    Diverticulosis    DJD (degenerative joint disease)    Gastric ulcer 04/17/10   Three 74m gastric ulcers, H.pylori serologies were negative   GERD (gastroesophageal reflux disease)    History of kidney stones    Hyperlipidemia    Hypertension    Idiopathic chronic inflammatory bowel disease 05/18/2010   left-sided UC   Kidney stone    Morbid obesity ([facility] 03/12/2018   Obstructive sleep apnea    on Cpap   Reflux 02/1995   S/P endoscopy 07/24/10   retained gastric contents, benign bx    His Past Surgical History Is Significant For: Past Surgical History:  Procedure Laterality Date   ANORECTAL MANOMETRY  203-14-2016  [facility]: concern for possible fissure. Noted pelvic floor dyssnyergy   BIOPSY  07/29/2017   Procedure: BIOPSY;  Surgeon: RDaneil Dolin MD;  Location: [facility];  Service: Endoscopy;;  ascending and sigmoid colon   CARDIAC CATHETERIZATION     with stent   CARDIOVASCULAR STRESS TEST  07/21/2009   No scintigraphic evidence of inducible myocardial ischemia   CARPAL TUNNEL RELEASE Left 01/17/2017   Procedure: LEFT CARPAL TUNNEL RELEASE;  Surgeon: HCarole Civil MD;  Location: [facility] ORS;  Service: Orthopedics;  Laterality: Left;   CATARACT EXTRACTION, BILATERAL Bilateral    CERVICAL SPINE SURGERY     C4-5   COLONOSCOPY  04/2005   granularity and friability erosions from rectum to 40cm. Bx infection vs IBD. C. Diff positive at the time.    COLONOSCOPY  05/2010   [facility]: left-sided UC, bx with no dysplasia, shallow diverticula   COLONOSCOPY [name] 11/07/2012   RKVQ:QVZD-GLOVFproctocolitis status post segmental biopsy/Sigmoid colon polyps removed as described above. Procedure compromisd by technical difficulties. bx: Inflammation limited to sigmoid and rectum on pathology.   [facility] proct[facility] limited to sigmoid   COLONOSCOPY WITH PROPOFOL [name] 07/29/2017    diverticulosis in colon, three 4-6 mm polyps at splenic flexure and in cecum, one 10 mm polyp in rectum, abnormal rectum and sigmoid consistent with active UC   CORONARY STENT PLACEMENT  01/1995   ESOPHAGOGASTRODUODENOSCOPY  07/24/2010   RIEP:PIRJJOesophagus   ESOPHAGOGASTRODUODENOSCOPY  04/2014   [facility] negative small bowel biopsies   FOOT SURGERY Bilateral two   KNEE SURGERY  two   POLYPECTOMY  07/29/2017   Procedure: POLYPECTOMY;  Surgeon: RDaneil Dolin MD;  Location: [facility];  Service: Endoscopy;;  splenic flexure, ascending colon polyp;rectal   SHOULDER SURGERY  two   TONSILLECTOMY     TRANSTHORACIC ECHOCARDIOGRAM  03/23/2009   EF 60-65%, normal LV systolic function   ULNAR HEAD EXCISION Left 01/17/2017   Procedure: LEFT ULNAR HEAD RESECTION;  Surgeon: HCarole Civil MD;  Location: [facility]  ORS;  Service: Orthopedics;  Laterality: Left;    His Family History Is Significant For: Family History  Problem Relation [employer]wood of Onset   Lung cancer Mother    Cancer Mother        breast   Diabetes Mother    Stroke Father    Hypertension Father    Kidney failure Brother    Other Child        blood infection   Colon cancer Neg Hx     His Social History Is Significant For: Social History   Socioeconomic History   Marital status: Widowed    Spouse name: Not on file   Number of children: Not on file   Years of education: Not on file   Highest education level: Not on file  Occupational History   Occupation: maintenance    Comment: retired  Tobacco Use   Smoking status: Former    Packs/day: 1.00    Years: 20.00    Pack years: 20.00    Types: Cigarettes    Quit date: 09/30/1969    Years since quitting: 51.4   Smokeless tobacco: Never  Vaping Use   Vaping Use: Never used  Substance and Sexual Activity   Alcohol use: No   Drug use: No   Sexual activity: Yes    Partners: Female    Birth control/protection: None    Comment: spouse  Other Topics  Concern   Not on file  Social History Narrative   Not on file   Social Determinants of Health   Financial Resource Strain: Low Risk    Difficulty of Paying Living Expenses: Not hard at all  Food Insecurity: No Food Insecurity   Worried About Charity fundraiser in the Last Year: Never true   [place] in the Last Year: Never true  Transportation Needs: No Transportation Needs   Lack of Transportation (Medical): No   Lack of Transportation (Non-Medical): No  Physical Activity: Inactive   Days of Exercise per Week: 0 days   Minutes of Exercise per Session: 0 min  Stress: Stress Concern Present   Feeling of Stress : To some extent  Social Connections: Moderately Integrated   Frequency of Communication with Friends and Family: More than three times a week   Frequency of Social Gatherings with Friends and Family: More than three times a week   Attends Religious Services: 1 to 4 times per year   Active Member of [employer] or Organizations: Yes   Attends Archivist Meetings: 1 to 4 times per year   Marital Status: Widowed    His Allergies Are:  No Known Allergies:   His Current Medications Are:  Outpatient Encounter Medications as of 03/07/2021  Medication Sig   acetaminophen (TYLENOL) 500 MG tablet Take 2 tablets (1,000 mg total) by mouth every 6 (six) hours as needed for headache (pain).   albuterol (VENTOLIN HFA) 108 (90 Base) MCG/ACT inhaler Inhale 2 puffs into the lungs every 6 (six) hours as needed for wheezing or shortness of breath.   aspirin EC 81 MG tablet Take 81 mg by mouth daily. Swallow whole.   budesonide-formoterol (SYMBICORT) 80-4.5 MCG/ACT inhaler Inhale 2 puffs into the lungs 2 (two) times daily.   celecoxib (CELEBREX) 100 MG capsule Take 100 mg by mouth every morning.   fluconazole (DIFLUCAN) 200 MG tablet Take 1 tablet (200 mg total) by mouth daily.   furosemide (LASIX) 40 MG tablet ONE TO ONE AND A HALF TABLET EACH  MORNING AS NEEDED (Patient taking  differently: Take 60 mg by mouth every morning.)   ketoconazole (NIZORAL) 2 % cream APPLY TOPICALLY TWICE DAILY. MIX WITH BARRIER CREAM SUCH AS DESITIN   loperamide (IMODIUM) 2 MG capsule Take 2-4 mg by mouth See admin instructions. Take 2 capsules (4 mg) by mouth every morning and night, may also take one capsule (2 mg) during the day or night as needed for loose stools   mesalamine (LIALDA) 1.2 g EC tablet Take 2.4 g by mouth 2 (two) times daily.   metoprolol tartrate (LOPRESSOR) 50 MG tablet Take 0.5 tablets (25 mg total) by mouth every evening.   Multiple Vitamin (MULTIVITAMIN WITH MINERALS) TABS tablet Take 1 tablet by mouth every morning. Silver   mupirocin ointment (BACTROBAN) 2 % Apply 1 application topically 2 (two) times daily as needed (wound care/raw areas).   nitroGLYCERIN (NITROSTAT) 0.4 MG SL tablet ONE TABLET UNDER TONGUE AS NEEDED FOR CHEST PAIN EVERY 5 MINUTES AS DIRECTED   nystatin cream (MYCOSTATIN) Apply 1 application topically 2 (two) times daily as needed (rash/itching).   pravastatin (PRAVACHOL) 40 MG tablet Take 1 tablet (40 mg total) by mouth daily.   Probiotic Product (ALIGN PO) Take 1 tablet by mouth every morning.   pyridOXINE (VITAMIN B-6) 100 MG tablet Take 100 mg by mouth daily.   venlafaxine XR (EFFEXOR XR) 75 MG 24 hr capsule Take 1 capsule (75 mg total) by mouth daily with breakfast.   vitamin C (ASCORBIC ACID) 500 MG tablet Take 500 mg by mouth daily.   Vitamin D, Ergocalciferol, (DRISDOL) 1.25 MG (50000 UNIT) CAPS capsule TAKE 1 CAPSULE BY MOUTH EVERY 7 DAYS   No facility-administered encounter medications on file as of 03/07/2021.  :   Review of Systems:  Out of a complete 14 point review of systems, all are reviewed and negative with the exception of these symptoms as listed below:  Review of Systems  Neurological:        Has CPAP using since approx 1999. DME [employer]. (Dr. Wolfgang Underwood sent for sleep study).  ESS: 20, FSS: 63. Naps all the time sleeps 18-19  hours daily.  Tired all the time.     Objective:  Neurological Exam  Physical Exam Physical Examination:   Vitals:   03/07/21 0816  BP: (!) 203/77  Pulse: 76    General Examination: The patient is a very pleasant 80 y.o. male in no acute distress.  He appears frail conditions, noisy breathing noted.  Mild tachypnea in the beginning of the visit.  HEENT: Normocephalic, atraumatic, pupils are equal, round and reactive to light, extraocular tracking is good without limitation to gaze excursion or nystagmus noted. Hearing is grossly intact. Face is symmetric with normal facial animation. Speech is slow but without dysarthria.  Very limited neck mobility noted.  No carotid bruits.  Airway examination reveals status post UPPP, moderate mouth dryness, dentures on top, edentulous on the bottom.  Tongue protrudes centrally.    Chest: Clear to auscultation but distant breath sounds.  No obvious wheezing.   Heart: S1+S2+0, regular and normal without murmurs, rubs or gallops noted.   Abdomen: Soft, non-tender and non-distended.  Extremities: There is 2+ pitting edema in the distal lower extremities bilaterally, particularly around the ankles.   Skin: Warm and dry without trophic changes noted.   Musculoskeletal: exam reveals no obvious joint deformities.  Limited neck mobility.  Neurologically:  Mental status: The patient is awake, alert and oriented in all 4 spheres. His immediate  and remote memory, attention, language skills and fund of knowledge are he is not able to provide his history.    Cranial nerves II - XII are as described above under HEENT exam.  Motor exam: Normal bulk, strength and tone is noted. There is no tremor, Romberg is negative. Reflexes are 2+ throughout. Fine motor skills and coordination: grossly intact.  Cerebellar testing: No dysmetria or intention tremor. There is no truncal or gait ataxia.  Sensory exam: intact to light touch in the upper and lower extremities.   Gait, station and balance: He uses a rolling walker.  Assessment and Plan:   In summary, RUI WORDELL is a very pleasant 81 y.o.-year old male with an underlying complex medical history of reflux disease, ulcerative colitis, kidney stone, hypertension, degenerative disc disease, ankylosing spondylitis, status post neck fusion, asthma, BPH, coronary artery disease, congestive heart failure, COPD, hyperlipidemia, and severe obesity, who presents for evaluation of his obstructive sleep apnea.  As his daughter recalls, his sleep apnea was in the severe range.  He was recently started on supplemental oxygen which is at 4 L/min bleed-in with CPAP of 15 cm.  While he is fully compliant with his CPAP, his leak is very high from the mask and he has discomfort from it.  He has a residual AHI elevated at 20.8/h currently.  He is advised to proceed with reevaluation with a split-night sleep study.  I explained this to the patient and his daughter at length.  We will start with the diagnostic portion without supplemental oxygen if possible and at oxygen as needed.  We will titrate with CPAP and likely BiPAP as his pressure requirement seems quite high.  He is advised to continue to use his current machine which is a [employer] air sense 10.  Based on the test results we will call them and hopefully he will qualify for a Chris machine.  We will plan a follow-up in sleep clinic afterwards.  For his COPD and oxygen requirement, I think he would benefit from seeing a pulmonologist.  They are encouraged to talk to you about this. I answered all the questions today and the patient and his daughter were in agreement.   Thank you very much for allowing me to participate in the care of this nice patient. If I can be of any further assistance to you please do not hesitate to call me at [phone].  Sincerely,   Chris Age, MD, PhD

## 2021-03-09 ENCOUNTER — Telehealth: Payer: Self-pay

## 2021-03-09 NOTE — Telephone Encounter (Signed)
LVM for pt to call me back to schedule sleep study  

## 2021-03-15 DIAGNOSIS — H6123 Impacted cerumen, bilateral: Secondary | ICD-10-CM | POA: Diagnosis not present

## 2021-03-15 DIAGNOSIS — H903 Sensorineural hearing loss, bilateral: Secondary | ICD-10-CM | POA: Diagnosis not present

## 2021-03-20 ENCOUNTER — Telehealth: Payer: Self-pay

## 2021-03-20 NOTE — Telephone Encounter (Signed)
LVM for pt to call me back to schedule sleep study  

## 2021-03-22 ENCOUNTER — Other Ambulatory Visit: Payer: Self-pay

## 2021-03-22 ENCOUNTER — Telehealth: Payer: Self-pay

## 2021-03-22 ENCOUNTER — Encounter: Payer: Medicare Other | Admitting: Family Medicine

## 2021-03-22 DIAGNOSIS — R0902 Hypoxemia: Secondary | ICD-10-CM | POA: Diagnosis not present

## 2021-03-22 DIAGNOSIS — J441 Chronic obstructive pulmonary disease with (acute) exacerbation: Secondary | ICD-10-CM | POA: Diagnosis not present

## 2021-03-22 DIAGNOSIS — J9601 Acute respiratory failure with hypoxia: Secondary | ICD-10-CM | POA: Diagnosis not present

## 2021-03-22 NOTE — Telephone Encounter (Signed)
Returned pt's call and LVM for pt to call me back to schedule sleep study ° °

## 2021-03-24 ENCOUNTER — Ambulatory Visit (INDEPENDENT_AMBULATORY_CARE_PROVIDER_SITE_OTHER): Payer: Medicare Other | Admitting: Neurology

## 2021-03-24 ENCOUNTER — Other Ambulatory Visit: Payer: Self-pay

## 2021-03-24 DIAGNOSIS — E669 Obesity, unspecified: Secondary | ICD-10-CM

## 2021-03-24 DIAGNOSIS — G4720 Circadian rhythm sleep disorder, unspecified type: Secondary | ICD-10-CM

## 2021-03-24 DIAGNOSIS — Z9989 Dependence on other enabling machines and devices: Secondary | ICD-10-CM

## 2021-03-24 DIAGNOSIS — G4733 Obstructive sleep apnea (adult) (pediatric): Secondary | ICD-10-CM

## 2021-03-24 DIAGNOSIS — G4719 Other hypersomnia: Secondary | ICD-10-CM

## 2021-03-24 DIAGNOSIS — G4739 Other sleep apnea: Secondary | ICD-10-CM

## 2021-03-24 DIAGNOSIS — J42 Unspecified chronic bronchitis: Secondary | ICD-10-CM

## 2021-03-24 DIAGNOSIS — I2510 Atherosclerotic heart disease of native coronary artery without angina pectoris: Secondary | ICD-10-CM

## 2021-03-24 DIAGNOSIS — I5032 Chronic diastolic (congestive) heart failure: Secondary | ICD-10-CM

## 2021-03-24 DIAGNOSIS — G4761 Periodic limb movement disorder: Secondary | ICD-10-CM

## 2021-03-24 DIAGNOSIS — Z9981 Dependence on supplemental oxygen: Secondary | ICD-10-CM

## 2021-03-24 DIAGNOSIS — R9431 Abnormal electrocardiogram [ECG] [EKG]: Secondary | ICD-10-CM

## 2021-03-30 NOTE — Addendum Note (Signed)
Addended by: Star Age on: 03/30/2021 05:36 PM   Modules accepted: Orders

## 2021-03-30 NOTE — Procedures (Signed)
PATIENT'S NAME:  [name], [name] DOB:      1940/03/05      MR#:    [id number]     DATE OF RECORDING: 03/24/2021 REFERRING M.D.:  Sallee Lange, MD Study Performed:  Split-Night Titration Study HISTORY: 81 year old man with a history of reflux disease, ulcerative colitis, kidney stone, hypertension, degenerative disc disease, ankylosing spondylitis, status post neck fusion, asthma, BPH, coronary artery disease, congestive heart failure, COPD, hyperlipidemia, and severe obesity, who presents for evaluation of his OSA. His Epworth sleepiness score is 20 out of 24. The patient's weight 302 pounds with a height of 73 (inches), resulting in a BMI of 40. kg/m2.   CURRENT MEDICATIONS: Tylenol, Ventolin HFA, Aspirin, Symbicort, Celebrex, Diflucan, Lasix, Nizoral, Imodium, Lialda, Lopressor, Multivitamins w/minerals, Bactroban, Nitrostat, Mycostatin, Pravachol, Align, Vitamin B6, Effexor, Vitamin C, Vitamin D    PROCEDURE:  This is a multichannel digital polysomnogram utilizing the Somnostar 11.2 system.  Electrodes and sensors were applied and monitored per AASM Specifications.   EEG, EOG, Chin and Limb EMG, were sampled at 200 Hz.  ECG, Snore and Nasal Pressure, Thermal Airflow, Respiratory Effort, CPAP Flow and Pressure, Oximetry was sampled at 50 Hz. Digital video and audio were recorded.      BASELINE STUDY WITHOUT CPAP RESULTS:  Lights Out was at 21:55 and Lights On at 05:01 for the night, split start at 00:12, epoch 279.  Total recording time (TRT) was 136.5, with a total sleep time (TST) of 123.5 minutes.   The patient's sleep latency was 8 minutes.  REM latency was 69.5 minutes.  The sleep efficiency was 90.5 %.    SLEEP ARCHITECTURE: WASO (Wake after sleep onset) was 1.5 minutes, Stage N1 was 7 minutes, Stage N2 was 108 minutes, Stage N3 was 0 minutes and Stage R (REM sleep) was 8.5 minutes.  The percentages were Stage N1 5.7%, Stage N2 87.4%, which is markedly increased, stage N3 was absent, and Stage  R (REM sleep) 6.9%. The arousals were noted as: 10 were spontaneous, 0 were associated with PLMs, 72 were associated with respiratory events.  RESPIRATORY ANALYSIS:  There were a total of 186 respiratory events:  153 obstructive apneas, 1 central apneas and 19 mixed apneas with a total of 173 apneas and an apnea index (AI) of 84.. There were 13 hypopneas with a hypopnea index of 6.3. The patient also had 0 respiratory event related arousals (RERAs).  Snoring was noted.     The total APNEA/HYPOPNEA INDEX (AHI) was 90.4 /hour and the total RESPIRATORY DISTURBANCE INDEX was 90.4 /hour.  14 events occurred in REM sleep and 46 events in NREM. The REM AHI was 98.8, /hour versus a non-REM AHI of 89.7 /hour. The patient spent 393 minutes sleep time in the supine position 0 minutes in non-supine. The supine AHI was 90.3 /hour versus a non-supine AHI of 0.0 /hour.  OXYGEN SATURATION & C02:  The wake baseline 02 saturation was 90%, with the lowest being 76%. Time spent below 89% saturation equaled 121 minutes. Supplemental O2 was not used.   PERIODIC LIMB MOVEMENTS: The patient had a total of 0 Periodic Limb Movements.  The Periodic Limb Movement (PLM) index was 0 /hour and the PLM Arousal index was 0 /hour.  Audio and video analysis did not show any abnormal or unusual movements, behaviors, phonations or vocalizations. The patient took no bathroom breaks. Moderate snoring was noted. The EKG was in keeping with normal sinus rhythm (NSR), with PVCs noted.   TITRATION STUDY WITH CPAP  RESULTS:   The patient was fitted with a medium Simplus FFM. CPAP was initiated at 5 cmH20 with heated humidity per AASM split night standards and pressure was advanced to 11 cm, but he had evidence of central apneas, starting with CPAP of 7 cm. He was switched to standard BiPAP of 15/11 cm at epoch 400, 01:12 and titrated to BiPAP of 17/13 cm with ongoing central events noted and desaturations into the mid 80s. At 01:54, epoch 46,  he was switched to BiPAP ST of 17/13 cm with a rate of 12/minute and further titrated to 20/16 cmH20, BUR 12, because of hypopneas, apneas and desaturations.  At a PAP pressure of 20/16 cmH20, there was a reduction of the AHI to 0 /hour, with supine NREM sleep achieved and O2 nadir of 88%.   Total recording time (TRT) was 289.5 minutes, with a total sleep time (TST) of 269.5 minutes. The patient's sleep latency was 3.5 minutes. REM latency was 30 minutes. The sleep efficiency was 93.1 %.    SLEEP ARCHITECTURE: Wake after sleep was 16.5 minutes, Stage N1 6.5 minutes, Stage N2 242.5 minutes, Stage N3 0 minutes and Stage R (REM sleep) 20.5 minutes. The percentages were: Stage N1 2.4%, Stage N2 90.%, which is highly increased, Stage N3 was absent and Stage R (REM sleep) 7.6%, which is reduced. The arousals were noted as: 8 were spontaneous, 1 were associated with PLMs, 15 were associated with respiratory events.  RESPIRATORY ANALYSIS:  There were a total of 151 respiratory events: 29 obstructive apneas, 23 central apneas and 23 mixed apneas with a total of 75 apneas and an apnea index (AI) of 16.7. There were 76 hypopneas with a hypopnea index of 16.9 /hour. The patient also had 0 respiratory event related arousals (RERAs).      The total APNEA/HYPOPNEA INDEX  (AHI) was 33.6 /hour and the total RESPIRATORY DISTURBANCE INDEX was 33.6 /hour.  28 events occurred in REM sleep and 123 events in NREM. The REM AHI was 82. /hour versus a non-REM AHI of 29.6 /hour.   OXYGEN SATURATION & C02:  The wake baseline 02 saturation was 90%, with the lowest being 82%. Time spent below 89% saturation equaled 107 minutes.  PERIODIC LIMB MOVEMENTS: The patient had a total of 110 Periodic Limb Movements. The Periodic Limb Movement (PLM) index was 24.5 /hour and the PLM Arousal index was .2 /hour.  Post-study, the patient indicated that sleep was better than usual.  POLYSOMNOGRAPHY IMPRESSION :   Severe Obstructive Sleep  Apnea (OSA)  Treatment Emergent central sleep apnea Insufficient treatment with CPAP Dysfunctions associated with sleep stages or arousals from sleep Periodic Limb Movement Disorder (PLMD) Non-specific abnormal EKG  RECOMMENDATIONS:  This patient has severe obstructive sleep apnea with evidence of treatment emergent central apneas with CPAP therapy, and incomplete resolution of his central respiratory events on standard BiPAP therapy. He responded well on BiPAP ST therapy. I will recommend home treatment with BiPAP ST of 20/16 cm with a back up rate of 12/min. No supplemental oxygen was utilized during this study. The patient slept on a wedge. The patient will be advised to be fully compliant with PAP therapy to improve sleep related symptoms and decrease long term cardiovascular risks. Please note that untreated obstructive sleep apnea may carry additional perioperative morbidity. Patients with significant obstructive sleep apnea should receive perioperative PAP therapy and the surgeons and particularly the anesthesiologist should be informed of the diagnosis and the severity of the sleep disordered breathing. Mild  PLMs (periodic limb movements of sleep) were noted during this study with no significant arousals; clinical correlation is recommended. Medication effect from the antidepressant medication should be considered.  The study showed occasional PVCs on single lead EKG; clinical correlation is recommended and consultation with cardiology may be feasible.  This study shows sleep fragmentation and abnormal sleep stage percentages; these are nonspecific findings and per se do not signify an intrinsic sleep disorder or a cause for the patient's sleep-related symptoms. Causes include (but are not limited to) the first night effect of the sleep study, circadian rhythm disturbances, medication effect or an underlying mood disorder or medical problem.  The patient should be cautioned not to drive, work at  heights, or operate dangerous or heavy equipment when tired or sleepy. Review and reiteration of good sleep hygiene measures should be pursued with any patient. The patient will be seen in follow-up in the sleep clinic at [facility] for discussion of the test results, symptom and treatment compliance review, further management strategies, etc. The referring provider will be notified of the test results.  I certify that I have reviewed the entire raw data recording prior to the issuance of this report in accordance with the Standards of Accreditation of the [place] [employer] (AASM)  [place] Shy Guallpa,MD,PhD [place] [place] [facility] (Neurology and Sleep Medicine)

## 2021-04-01 NOTE — Progress Notes (Signed)
error    This encounter was created in error - please disregard.

## 2021-04-04 ENCOUNTER — Telehealth: Payer: Self-pay

## 2021-04-04 NOTE — Telephone Encounter (Signed)
I called patient to discuss. No answer, left a message asking him to call me back.

## 2021-04-04 NOTE — Telephone Encounter (Signed)
-----   Message from Star Age, MD sent at 03/30/2021  5:36 PM EST ----- Patient was referred by PCP for re-eval of his OSA. He had a split night study, which showed severe sleep apnea. Please advise pt or daughter, that I will prescribe a BiPAP ST machine and CPAP alone was not enough to properly treat his sleep apnea. Please re-enforce importance of compliance and he will need a FU in 60-90 days after starting treatment. I am hoping the ins will cover this machine. Until he has the new machine he can use the current machine.

## 2021-04-05 NOTE — Telephone Encounter (Signed)
Patient's daughter, [name], returned the call and we discussed the patient's split-night sleep study results.  She understands the patient did not do well on CPAP alone so she is going to prescribe a BiPAP ST machine.  The patient's daughter noted that when he came home from his sleep study, he mentioned he had gotten less sleep, however he felt very refreshed and was excited.  She is amenable to beginning the process of starting BiPAP ST.  I reinforced the compliance which includes needing a follow-up 61 to 90 days after he starts the treatment and also using the machine at least 4 hours every night, however our goal for him would be to use it all night long.  She verbalized understanding.  In order to ensure the appointment falls in the 61 to 90 day window, the daughter is going to call us and schedule the appt as soon as he receives his machine. Patient currently is with [facility] however they are not satisfied, so we discussed options and we will be switching the patient to [facility], who already supplies a his oxygen equipment. Her questions were answered. ? ?Result sent to referring MD. Orders sent to [facility] via secure message. Letter sent to pt via mychart.  ? ? ?

## 2021-04-06 NOTE — Telephone Encounter (Signed)
Update: [facility] has the order and will process.  ?

## 2021-04-19 DIAGNOSIS — J441 Chronic obstructive pulmonary disease with (acute) exacerbation: Secondary | ICD-10-CM | POA: Diagnosis not present

## 2021-04-19 DIAGNOSIS — R0902 Hypoxemia: Secondary | ICD-10-CM | POA: Diagnosis not present

## 2021-04-19 DIAGNOSIS — J9601 Acute respiratory failure with hypoxia: Secondary | ICD-10-CM | POA: Diagnosis not present

## 2021-05-03 ENCOUNTER — Telehealth: Payer: Self-pay

## 2021-05-03 DIAGNOSIS — G4739 Other sleep apnea: Secondary | ICD-10-CM

## 2021-05-03 DIAGNOSIS — Z9989 Dependence on other enabling machines and devices: Secondary | ICD-10-CM

## 2021-05-03 DIAGNOSIS — G4733 Obstructive sleep apnea (adult) (pediatric): Secondary | ICD-10-CM

## 2021-05-03 NOTE — Telephone Encounter (Signed)
Noted. Order sent via community message to [facility] staff and was also faxed to [facility] [phone]. Received a receipt of confirmation. ? ?

## 2021-05-03 NOTE — Telephone Encounter (Signed)
Melissa with [facility] has called. Unfortunately, patient does not qualify for BiPAP ST. Insurance has denied payment for it. Pt did not have at least 50% centrals from BiPAP to BiPAP ST. However, pt DOES qualify for BiPAP. BiPAP needs to be ordered and Rx sent to [place]. Then at follow up visit, if DL shows noncompliance and/or centrals are present, we then can bring pt back in for BiPAP ST sleep study.  ?

## 2021-05-03 NOTE — Telephone Encounter (Signed)
Thank you for the notification, [name].  ?I changed the order to standard BiPAP therapy.  ?

## 2021-05-03 NOTE — Addendum Note (Signed)
Addended by: Star Age on: 05/03/2021 03:49 PM ? ? Modules accepted: Orders ? ?

## 2021-05-05 ENCOUNTER — Ambulatory Visit (INDEPENDENT_AMBULATORY_CARE_PROVIDER_SITE_OTHER): Payer: Medicare Other | Admitting: Pharmacist

## 2021-05-05 DIAGNOSIS — J439 Emphysema, unspecified: Secondary | ICD-10-CM

## 2021-05-05 DIAGNOSIS — E785 Hyperlipidemia, unspecified: Secondary | ICD-10-CM

## 2021-05-05 DIAGNOSIS — I10 Essential (primary) hypertension: Secondary | ICD-10-CM

## 2021-05-05 DIAGNOSIS — I2510 Atherosclerotic heart disease of native coronary artery without angina pectoris: Secondary | ICD-10-CM

## 2021-05-05 DIAGNOSIS — Z9861 Coronary angioplasty status: Secondary | ICD-10-CM

## 2021-05-05 DIAGNOSIS — F321 Major depressive disorder, single episode, moderate: Secondary | ICD-10-CM | POA: Diagnosis not present

## 2021-05-05 DIAGNOSIS — I5032 Chronic diastolic (congestive) heart failure: Secondary | ICD-10-CM | POA: Diagnosis not present

## 2021-05-05 NOTE — Patient Instructions (Signed)
[name], ? ?It was great to talk to you today! ? ?Please call me with any questions or concerns. ? ?Visit Information ? ?Following are the goals we discussed today:  ? Goals Addressed   ? ?  ?  ?  ?  ? This Visit's Progress  ?  Medication Management     ?  Patient Goals/Self-Care Activities ?Patient will:  ?Take medications as prescribed ?Check blood pressure at least once daily, document, and provide at future appointments ?Weigh daily, and contact provider if weight gain of more than 3 lbs in 1 day or more than 5 lbs in 1 week ? ? ?  ? ?  ?  ? ?Follow-up plan: The patient has been provided with contact information for the care management team and has been advised to call with any health related questions or concerns.  ? ?Patient verbalizes understanding of instructions and care plan provided today and agrees to view in [place]. Active MyChart status confirmed with patient.   ? ?Please call the care guide team at [phone] if you need to cancel or reschedule your appointment.  ? ?Kennon Holter, PharmD, BCACP, CPP ?Clinical Pharmacist Practitioner ?[place] ?[phone]  ?

## 2021-05-05 NOTE — Chronic Care Management (AMB) (Signed)
? ? ?Chronic Care Management ?Pharmacy Note ? ?05/05/2021 ?Name:  [name] MRN:  [id number] DOB:  22-Nov-1940 ? ?Summary: ?Hypertension/Heart failure with preserved ejection fraction ?Blood pressure control unclear as it has been high in office lately but daughter reports normal blood pressure at home. Will not recommend tight blood pressure goals given issues with dizziness/balance and falls. Blood pressure goal <140/90 mmHg per AAFP guidelines. ?Current treatment: metoprolol tartrate 25 mg by mouth every evening and furosemide 40-60 mg every morning ?Most recent echocardiogram: LVEF 60-65% (09/29/19) ?Current home blood pressure: mostly 130s/70s ?Current home weight: stable ?Encourage dietary sodium restriction (<3 g/day) ?Educated on the importance of weighing daily. Patient aware to contact cardiology/primary care team if weight gain >3 lbs in 1 day or >5 lbs in 1 week ?Recommend home blood pressure monitoring, to bring results in next visit ?Continue current medications per PCP ? ?Chronic Obstructive Pulmonary Disease: ?Controlled per patient. Only has to use albuterol every couple days and mostly on exertion.  ?Recent changes: primary care provider initiated Symbicort in January 2023.  ?Continue albuterol metered dose (ProAir, Ventolin, Proventil) 2 puffs by mouth every 6 hours as needed for shortness of breath or wheezing and budesonide/formoterol (Symbicort HFA) 80-4.5 mcg 2 puffs by mouth twice daily ?Reminded patient to rinse mouth with water and spit after using ICS containing inhaler to prevent thrush ? ?Depression ?Improved per patient's daughter. Appetite and mood have improved. ?Recent changes: primary care provider switched therapy from sertraline to venlafaxine ?Continue venlafaxine 75 mg by mouth daily ? ?Subjective: ?[name] is an 81 y.o. year old male who is a primary patient of Luking, Elayne Snare, MD.  The CCM team was consulted for assistance with disease management and care  coordination needs.   ? ?Engaged with patient's caregiver (daughter [name]) by telephone for follow up visit in response to provider referral for pharmacy case management and/or care coordination services.  ? ?Consent to Services:  ?The patient was given information about Chronic Care Management services, agreed to services, and gave verbal consent prior to initiation of services.  Please see initial visit note for detailed documentation.  ? ?Patient Care Team: ?[name], MD as PCP - General (Family Medicine) ?Lorretta Harp, MD as PCP - Cardiology (Cardiology) ?Rourk, Cristopher Estimable, MD as Consulting Physician (Gastroenterology) ?Beryle Lathe, [facility] (Pharmacist) ? ?Objective: ? ?Lab Results  ?Component Value Date  ? CREATININE 1.06 11/10/2020  ? CREATININE 1.07 10/30/2020  ? CREATININE 1.31 (H) 10/29/2020  ? ? ?Lab Results  ?Component Value Date  ? HGBA1C 6.1 (H) 06/23/2020  ? ?Last diabetic Eye exam: No results found for: HMDIABEYEEXA  ?Last diabetic Foot exam: No results found for: HMDIABFOOTEX  ? ?   ?Component Value Date/Time  ? CHOL 137 11/07/2018 1140  ? TRIG 107 04/08/2019 2249  ? HDL 49 11/07/2018 1140  ? CHOLHDL 2.8 11/07/2018 1140  ? CHOLHDL 3.2 06/08/2015 0907  ? VLDL 33 (H) 06/08/2015 0093  ? [address] 11/07/2018 1140  ? ? ? ?  Latest Ref Rng & Units 10/28/2020  ?  9:31 PM 06/23/2020  ? 10:46 AM 12/07/2019  ?  3:00 PM  ?Hepatic Function  ?Total Protein 6.5 - 8.1 g/dL 7.1   6.6   7.0    ?Albumin 3.5 - 5.0 g/dL 3.4   3.8   3.9    ?AST 15 - 41 U/L _0 ?ALT 0 - 44 U/L 17  14   10    ?Alk Phosphatase 38 - 126 U/L 83   84   83    ?Total Bilirubin 0.3 - 1.2 mg/dL 0.5   0.4   0.5    ?Bilirubin, Direct 0.00 - 0.40 mg/dL   0.17    ? ? ?Lab Results  ?Component Value Date/Time  ? TSH 3.680 11/10/2020 12:23 PM  ? TSH 5.740 (H) 06/23/2020 10:46 AM  ? FREET4 1.13 06/23/2020 10:46 AM  ? ? ? ?  Latest Ref Rng & Units 10/29/2020  ?  1:07 AM 10/28/2020  ? 10:13 PM 10/28/2020  ?  9:31 PM  ?CBC  ?WBC 4.0  - 10.5 K/uL 11.7    10.1    ?Hemoglobin 13.0 - 17.0 g/dL 13.4   14.6   14.3    ?Hematocrit 39.0 - 52.0 % 43.5   43.0   44.8    ?Platelets 150 - 400 K/uL 193    207    ? ? ?Lab Results  ?Component Value Date/Time  ? VD25OH 23.5 (L) 06/23/2020 10:46 AM  ? VD25OH 30.6 12/07/2019 03:00 PM  ? ? ?Clinical ASCVD: No  ?The ASCVD Risk score (Arnett DK, et al., 2019) failed to calculate for the following reasons: ?  The 2019 ASCVD risk score is only valid for ages 11 to 45   ? ?Social History  ? ?Tobacco Use  ?Smoking Status Former  ? Packs/day: 1.00  ? Years: 20.00  ? Pack years: 20.00  ? Types: Cigarettes  ? Quit date: 09/30/1969  ? Years since quitting: 51.6  ?Smokeless Tobacco Never  ? ?BP Readings from Last 3 Encounters:  ?03/07/21 (!) 203/77  ?11/10/20 (!) 160/74  ?10/30/20 125/62  ? ?Pulse Readings from Last 3 Encounters:  ?03/07/21 76  ?11/10/20 (!) 58  ?10/30/20 77  ? ?Wt Readings from Last 3 Encounters:  ?03/07/21 (!) 302 lb 12.8 oz (137.3 kg)  ?01/31/21 300 lb (136.1 kg)  ?11/10/20 297 lb (134.7 kg)  ? ? ?Assessment: Review of patient past medical history, allergies, medications, health status, including review of consultants reports, laboratory and other test data, was performed as part of comprehensive evaluation and provision of chronic care management services.  ? ?SDOH:  (Social Determinants of Health) assessments and interventions performed:  ? ? ?CCM Care Plan ? ?No Known Allergies ? ?Medications Reviewed Today   ? ? Reviewed by Beryle Lathe, [facility] (Pharmacist) on 05/05/21 at [phone]  Med List Status: <None>  ? ?Medication Order Taking? Sig Documenting Provider Last Dose Status Informant  ?acetaminophen (TYLENOL) 500 MG tablet [id number] Yes Take 2 tablets (1,000 mg total) by mouth every 6 (six) hours as needed for headache (pain). [name], MD Taking Active   ?albuterol (VENTOLIN HFA) 108 (90 Base) MCG/ACT inhaler [id number] Yes Inhale 2 puffs into the lungs every 6 (six) hours as needed for  wheezing or shortness of breath. [name], MD Taking Active   ?aspirin EC 81 MG tablet [id number] Yes Take 81 mg by mouth daily. Swallow whole. [provider] Taking Active Family Member  ?budesonide-formoterol (SYMBICORT) 80-4.5 MCG/ACT inhaler [id number] Yes Inhale 2 puffs into the lungs 2 (two) times daily. [name], MD Taking Active   ?celecoxib (CELEBREX) 100 MG capsule [id number] Yes Take 100 mg by mouth every morning. [provider] Taking Active Multiple Informants  ?furosemide (LASIX) 40 MG tablet [id number] Yes ONE TO ONE AND A HALF TABLET EACH MORNING AS NEEDED  ?Patient taking differently: Take  60 mg by mouth every morning.  ? [name], MD Taking Active Self  ?ketoconazole (NIZORAL) 2 % cream [id number] Yes APPLY TOPICALLY TWICE DAILY. MIX WITH BARRIER CREAM SUCH AS DESITIN [name], MD Taking Active   ?mesalamine (LIALDA) 1.2 g EC tablet [id number] Yes Take 2.4 g by mouth 2 (two) times daily. [provider] Taking Active Multiple Informants  ?metoprolol tartrate (LOPRESSOR) 50 MG tablet [id number] Yes Take 0.5 tablets (25 mg total) by mouth every evening. [name], MD Taking Active   ?Multiple Vitamin (MULTIVITAMIN WITH MINERALS) TABS tablet [id number] Yes Take 1 tablet by mouth every morning. Silver [provider] Taking Active Multiple Informants  ?mupirocin ointment (BACTROBAN) 2 % [id number] Yes Apply 1 application topically 2 (two) times daily as needed (wound care/raw areas). [name], MD Taking Active   ?nitroGLYCERIN (NITROSTAT) 0.4 MG SL tablet [id number] No ONE TABLET UNDER TONGUE AS NEEDED FOR CHEST PAIN EVERY 5 MINUTES AS DIRECTED Lorretta Harp, MD Unknown Active Multiple Informants  ?nystatin cream (MYCOSTATIN) [id number] Yes Apply 1 application topically 2 (two) times daily as needed (rash/itching). [name], MD Taking Active   ?pravastatin (PRAVACHOL) 40 MG tablet [id number] Yes Take 1 tablet (40 mg total) by  mouth daily. [name], MD Taking Active   ?Probiotic Product (ALIGN PO) [id number] Yes Take 1 tablet by mouth every morning. [provider] Taking Active Child  ?pyridOXINE (VITAMIN B-6) 100 MG ta

## 2021-05-08 NOTE — Telephone Encounter (Signed)
Pt's daughter, [name] (on [place]) requesting if can prescription for BIPAP sent to [facility]. [facility] has not been effective as far as Therapist, art. Would like a call from the nurse. ?

## 2021-05-08 NOTE — Telephone Encounter (Signed)
Called Daughter back [name]) (checked DPR) informed daughter if patient switches DME process of BIPAP will start all over again. Informed daughter spoke with Ace Gins will call daughter today to give her the next steps for BIPAP for patient. Daughter thanked me for calling her back  ?

## 2021-05-17 ENCOUNTER — Ambulatory Visit (INDEPENDENT_AMBULATORY_CARE_PROVIDER_SITE_OTHER): Payer: Medicare Other | Admitting: Podiatry

## 2021-05-17 DIAGNOSIS — Z91199 Patient's noncompliance with other medical treatment and regimen due to unspecified reason: Secondary | ICD-10-CM

## 2021-05-20 DIAGNOSIS — J9601 Acute respiratory failure with hypoxia: Secondary | ICD-10-CM | POA: Diagnosis not present

## 2021-05-20 DIAGNOSIS — J441 Chronic obstructive pulmonary disease with (acute) exacerbation: Secondary | ICD-10-CM | POA: Diagnosis not present

## 2021-05-20 DIAGNOSIS — R0902 Hypoxemia: Secondary | ICD-10-CM | POA: Diagnosis not present

## 2021-05-23 NOTE — Progress Notes (Signed)
   Complete physical exam  Patient: [name]   DOB: 11/25/2000   81 y.o. Male  MRN: [id number]  Subjective:    No chief complaint on file.   [name] is a 81 y.o. male who presents today for a complete physical exam. She reports consuming a {diet types:17450} diet. {types:19826} She generally feels {DESC; WELL/FAIRLY WELL/POORLY:18703}. She reports sleeping {DESC; WELL/FAIRLY WELL/POORLY:18703}. She {does/does not:200015} have additional problems to discuss today.    Most recent fall risk assessment:    08/02/2021   10:42 AM  Fall Risk   Falls in the past year? 0  Number falls in past yr: 0  Injury with Fall? 0  Risk for fall due to : No Fall Risks  Follow up Falls evaluation completed     Most recent depression screenings:    08/02/2021   10:42 AM 06/23/2020   10:46 AM  PHQ 2/9 Scores  PHQ - 2 Score 0 0  PHQ- 9 Score 5     {VISON DENTAL STD PSA (Optional):27386}  {History (Optional):23778}  Patient Care Team: Jessup, Joy, NP as PCP - General (Nurse Practitioner)   Outpatient Medications Prior to Visit  Medication Sig   fluticasone (FLONASE) 50 MCG/ACT nasal spray Place 2 sprays into both nostrils in the morning and at bedtime. After 7 days, reduce to once daily.   norgestimate-ethinyl estradiol (SPRINTEC 28) 0.25-35 MG-MCG tablet Take 1 tablet by mouth daily.   Nystatin POWD Apply liberally to affected area 2 times per day   spironolactone (ALDACTONE) 100 MG tablet Take 1 tablet (100 mg total) by mouth daily.   No facility-administered medications prior to visit.    ROS        Objective:     There were no vitals taken for this visit. {Vitals History (Optional):23777}  Physical Exam   No results found for any visits on 09/07/21. {Show previous labs (optional):23779}    Assessment & Plan:    Routine Health Maintenance and Physical Exam  Immunization History  Administered Date(s) Administered   DTaP 02/08/1999, 04/06/1999,  06/15/1999, 02/29/2000, 09/14/2003   Hepatitis A 07/11/2007, 07/16/2008   Hepatitis B 11/26/1998, 01/03/1999, 06/15/1999   HiB (PRP-OMP) 02/08/1999, 04/06/1999, 06/15/1999, 02/29/2000   IPV 02/08/1999, 04/06/1999, 12/04/1999, 09/14/2003   Influenza,inj,Quad PF,6+ Mos 10/16/2013   Influenza-Unspecified 01/16/2012   MMR 12/03/2000, 09/14/2003   Meningococcal Polysaccharide 07/16/2011   Pneumococcal Conjugate-13 02/29/2000   Pneumococcal-Unspecified 06/15/1999, 08/29/1999   Tdap 07/16/2011   Varicella 12/04/1999, 07/11/2007    Health Maintenance  Topic Date Due   HIV Screening  Never done   Hepatitis C Screening  Never done   INFLUENZA VACCINE  09/05/2021   PAP-Cervical Cytology Screening  09/07/2021 (Originally 11/25/2019)   PAP SMEAR-Modifier  09/07/2021 (Originally 11/25/2019)   TETANUS/TDAP  09/07/2021 (Originally 07/15/2021)   HPV VACCINES  Discontinued   COVID-19 Vaccine  Discontinued    Discussed health benefits of physical activity, and encouraged her to engage in regular exercise appropriate for her age and condition.  Problem List Items Addressed This Visit   None Visit Diagnoses     Annual physical exam    -  Primary   Cervical cancer screening       Need for Tdap vaccination          No follow-ups on file.     Joy Jessup, NP   

## 2021-06-02 DIAGNOSIS — J449 Chronic obstructive pulmonary disease, unspecified: Secondary | ICD-10-CM | POA: Diagnosis not present

## 2021-06-11 ENCOUNTER — Other Ambulatory Visit: Payer: Self-pay | Admitting: Family Medicine

## 2021-06-14 ENCOUNTER — Other Ambulatory Visit: Payer: Self-pay

## 2021-06-14 MED ORDER — METOPROLOL TARTRATE 50 MG PO TABS: ORAL_TABLET | ORAL | 0 refills | Status: DC

## 2021-06-19 DIAGNOSIS — R0902 Hypoxemia: Secondary | ICD-10-CM | POA: Diagnosis not present

## 2021-06-19 DIAGNOSIS — J9601 Acute respiratory failure with hypoxia: Secondary | ICD-10-CM | POA: Diagnosis not present

## 2021-06-19 DIAGNOSIS — J441 Chronic obstructive pulmonary disease with (acute) exacerbation: Secondary | ICD-10-CM | POA: Diagnosis not present

## 2021-07-02 DIAGNOSIS — J449 Chronic obstructive pulmonary disease, unspecified: Secondary | ICD-10-CM | POA: Diagnosis not present

## 2021-07-10 ENCOUNTER — Telehealth: Payer: Self-pay | Admitting: *Deleted

## 2021-07-10 NOTE — Telephone Encounter (Signed)
Informed daughter of md message and recommendation. Verbalized understanding. Appt made.

## 2021-07-10 NOTE — Telephone Encounter (Signed)
Patient's daughter says he fell last year. He now has a "dip" in his head where the hematoma was and he is having headaches everyday. She thinks maybe he needs another CT scan done. Please advise. Thank you

## 2021-07-10 NOTE — Telephone Encounter (Signed)
It is possible that he may need a CT If he is not having any acute issue (severe headaches vomiting ) I recommend office visit for further evaluation then we can order up the test

## 2021-07-11 ENCOUNTER — Encounter: Payer: Self-pay | Admitting: Family Medicine

## 2021-07-11 ENCOUNTER — Ambulatory Visit (INDEPENDENT_AMBULATORY_CARE_PROVIDER_SITE_OTHER): Payer: Medicare Other | Admitting: Family Medicine

## 2021-07-11 VITALS — BP 138/74 | HR 65 | Temp 98.1°F | Wt 310.4 lb

## 2021-07-11 DIAGNOSIS — E785 Hyperlipidemia, unspecified: Secondary | ICD-10-CM

## 2021-07-11 DIAGNOSIS — G4733 Obstructive sleep apnea (adult) (pediatric): Secondary | ICD-10-CM

## 2021-07-11 DIAGNOSIS — Z79899 Other long term (current) drug therapy: Secondary | ICD-10-CM

## 2021-07-11 DIAGNOSIS — I10 Essential (primary) hypertension: Secondary | ICD-10-CM

## 2021-07-11 DIAGNOSIS — R519 Headache, unspecified: Secondary | ICD-10-CM | POA: Diagnosis not present

## 2021-07-11 DIAGNOSIS — R7303 Prediabetes: Secondary | ICD-10-CM | POA: Diagnosis not present

## 2021-07-11 DIAGNOSIS — G500 Trigeminal neuralgia: Secondary | ICD-10-CM | POA: Diagnosis not present

## 2021-07-11 MED ORDER — DULOXETINE HCL 30 MG PO CPEP: 30.0000 mg | ORAL_CAPSULE | Freq: Every day | ORAL | 1 refills | Status: DC

## 2021-07-11 MED ORDER — KETOCONAZOLE 2 % EX CREA: TOPICAL_CREAM | CUTANEOUS | 4 refills | Status: DC

## 2021-07-11 NOTE — Progress Notes (Signed)
   Subjective:    Patient ID: [name], male    DOB: 02-22-1940, 81 y.o.   MRN: [id number]  HPI Pt arrives today for headaches. Pt states he gets a headache about 3 times per week. Pt states he has sharp pains on left side of head where hematoma was located. Pt also states that he has 2 new knots on right side of head. Pt reports dizziness in the mornings when getting up. Pt has recently gained weight-daughter states he is a "bored eater". Pt was eating 4 meals a day.  Daughter requesting nystatin powder and cream for yeast  Review of Systems     Objective:   Physical Exam General-in no acute distress Eyes-no discharge Lungs-respiratory rate normal, CTA CV-no murmurs,RRR Extremities skin warm dry no edema Neuro grossly normal Behavior normal, alert  The area on his scalp is more of a lack of soft tissue underneath the injury I do not feel there is any type of depressed skull fracture      Assessment & Plan:  1. Essential hypertension Blood pressure decent control continue current measures watch diet closely try to lose weight - Sed Rate (ESR) - Hemoglobin A1c - CBC with Differential - Lipid Profile - Hepatic function panel - Basic Metabolic Panel (BMET)  2. Obstructive sleep apnea Continue CPAP machine. - Sed Rate (ESR) - Hemoglobin A1c - CBC with Differential - Lipid Profile - Hepatic function panel - Basic Metabolic Panel (BMET)  3. Temporal headache Check sed rate doubt inflammatory headaches doubt that he is having any type of traumatic headache I believe that this is trigeminal neuralgia by description doubt temporal arteritis - Sed Rate (ESR) - Hemoglobin A1c - CBC with Differential - Lipid Profile - Hepatic function panel - Basic Metabolic Panel (BMET)  4. Trigeminal neuralgia We will try Cymbalta in place of Effexor patient will do a video visit in 3 to 4 weeks hold off on Tegretol because of side effects - Sed Rate (ESR) - Hemoglobin A1c - CBC  with Differential - Lipid Profile - Hepatic function panel - Basic Metabolic Panel (BMET)  5. Prediabetes Healthy diet watch starches in diet.  Check A1c - Sed Rate (ESR) - Hemoglobin A1c - CBC with Differential - Lipid Profile - Hepatic function panel - Basic Metabolic Panel (BMET)  6. High risk medication use Labs ordered - Sed Rate (ESR) - Hemoglobin A1c - CBC with Differential - Lipid Profile - Hepatic function panel - Basic Metabolic Panel (BMET)  7. Hyperlipidemia, unspecified hyperlipidemia type Continue medication check labs - Sed Rate (ESR) - Hemoglobin A1c - CBC with Differential - Lipid Profile - Hepatic function panel - Basic Metabolic Panel (BMET)  Video visit in 1 month to see how Cymbalta is doing  Follow-up comprehensive check in the fall

## 2021-07-12 LAB — CBC WITH DIFFERENTIAL/PLATELET
Basophils Absolute: 0.1 10*3/uL (ref 0.0–0.2)
Basos: 1 %
EOS (ABSOLUTE): 0.3 10*3/uL (ref 0.0–0.4)
Eos: 3 %
Hematocrit: 41.1 % (ref 37.5–51.0)
Hemoglobin: 14.2 g/dL (ref 13.0–17.7)
Immature Grans (Abs): 0 10*3/uL (ref 0.0–0.1)
Immature Granulocytes: 0 %
Lymphocytes Absolute: 3.5 10*3/uL — ABNORMAL HIGH (ref 0.7–3.1)
Lymphs: 39 %
MCH: 30.3 pg (ref 26.6–33.0)
MCHC: 34.5 g/dL (ref 31.5–35.7)
MCV: 88 fL (ref 79–97)
Monocytes Absolute: 0.8 10*3/uL (ref 0.1–0.9)
Monocytes: 9 %
Neutrophils Absolute: 4.3 10*3/uL (ref 1.4–7.0)
Neutrophils: 48 %
Platelets: 236 10*3/uL (ref 150–450)
RBC: 4.68 x10E6/uL (ref 4.14–5.80)
RDW: 13 % (ref 11.6–15.4)
WBC: 9.1 10*3/uL (ref 3.4–10.8)

## 2021-07-12 LAB — HEPATIC FUNCTION PANEL
ALT: 30 IU/L (ref 0–44)
AST: 40 IU/L (ref 0–40)
Albumin: 3.7 g/dL (ref 3.6–4.6)
Alkaline Phosphatase: 109 IU/L (ref 44–121)
Bilirubin Total: 0.3 mg/dL (ref 0.0–1.2)
Bilirubin, Direct: 0.11 mg/dL (ref 0.00–0.40)
Total Protein: 6.7 g/dL (ref 6.0–8.5)

## 2021-07-12 LAB — HEMOGLOBIN A1C
Est. average glucose Bld gHb Est-mCnc: 134 mg/dL
Hgb A1c MFr Bld: 6.3 % — ABNORMAL HIGH (ref 4.8–5.6)

## 2021-07-12 LAB — LIPID PANEL
Chol/HDL Ratio: 3.8 ratio (ref 0.0–5.0)
Cholesterol, Total: 172 mg/dL (ref 100–199)
HDL: 45 mg/dL (ref >39)
LDL Chol Calc (NIH): 79 mg/dL (ref 0–99)
Triglycerides: 293 mg/dL — ABNORMAL HIGH (ref 0–149)
VLDL Cholesterol Cal: 48 mg/dL — ABNORMAL HIGH (ref 5–40)

## 2021-07-12 LAB — BASIC METABOLIC PANEL
BUN/Creatinine Ratio: 12 (ref 10–24)
BUN: 14 mg/dL (ref 8–27)
CO2: 24 mmol/L (ref 20–29)
Calcium: 10 mg/dL (ref 8.6–10.2)
Chloride: 104 mmol/L (ref 96–106)
Creatinine, Ser: 1.15 mg/dL (ref 0.76–1.27)
Glucose: 116 mg/dL — ABNORMAL HIGH (ref 70–99)
Potassium: 4.3 mmol/L (ref 3.5–5.2)
Sodium: 144 mmol/L (ref 134–144)
eGFR: 64 mL/min/{1.73_m2} (ref >59)

## 2021-07-12 LAB — SEDIMENTATION RATE: Sed Rate: 46 mm/hr — ABNORMAL HIGH (ref 0–30)

## 2021-07-20 DIAGNOSIS — R0902 Hypoxemia: Secondary | ICD-10-CM | POA: Diagnosis not present

## 2021-07-20 DIAGNOSIS — J441 Chronic obstructive pulmonary disease with (acute) exacerbation: Secondary | ICD-10-CM | POA: Diagnosis not present

## 2021-07-20 DIAGNOSIS — J9601 Acute respiratory failure with hypoxia: Secondary | ICD-10-CM | POA: Diagnosis not present

## 2021-07-31 ENCOUNTER — Encounter: Payer: Self-pay | Admitting: Podiatry

## 2021-07-31 ENCOUNTER — Ambulatory Visit: Payer: Medicare Other | Admitting: Podiatry

## 2021-07-31 DIAGNOSIS — B351 Tinea unguium: Secondary | ICD-10-CM | POA: Diagnosis not present

## 2021-07-31 DIAGNOSIS — M79676 Pain in unspecified toe(s): Secondary | ICD-10-CM

## 2021-08-02 DIAGNOSIS — J449 Chronic obstructive pulmonary disease, unspecified: Secondary | ICD-10-CM | POA: Diagnosis not present

## 2021-08-10 ENCOUNTER — Other Ambulatory Visit: Payer: Self-pay | Admitting: Family Medicine

## 2021-08-10 DIAGNOSIS — J449 Chronic obstructive pulmonary disease, unspecified: Secondary | ICD-10-CM | POA: Diagnosis not present

## 2021-08-19 DIAGNOSIS — R0902 Hypoxemia: Secondary | ICD-10-CM | POA: Diagnosis not present

## 2021-08-19 DIAGNOSIS — J441 Chronic obstructive pulmonary disease with (acute) exacerbation: Secondary | ICD-10-CM | POA: Diagnosis not present

## 2021-08-19 DIAGNOSIS — J9601 Acute respiratory failure with hypoxia: Secondary | ICD-10-CM | POA: Diagnosis not present

## 2021-08-21 ENCOUNTER — Ambulatory Visit (INDEPENDENT_AMBULATORY_CARE_PROVIDER_SITE_OTHER): Payer: Medicare Other | Admitting: Family Medicine

## 2021-08-21 ENCOUNTER — Ambulatory Visit (HOSPITAL_COMMUNITY)
Admission: RE | Admit: 2021-08-21 | Discharge: 2021-08-21 | Disposition: A | Discharge status: Home / Self Care | Payer: Medicare Other | Source: Ambulatory Visit | Attending: Family Medicine | Admitting: Family Medicine

## 2021-08-21 ENCOUNTER — Encounter: Payer: Self-pay | Admitting: Family Medicine

## 2021-08-21 VITALS — BP 136/78 | HR 64 | Temp 97.2°F | Ht 73.0 in | Wt 310.0 lb

## 2021-08-21 DIAGNOSIS — R600 Localized edema: Secondary | ICD-10-CM | POA: Diagnosis not present

## 2021-08-21 DIAGNOSIS — R0609 Other forms of dyspnea: Secondary | ICD-10-CM

## 2021-08-21 DIAGNOSIS — J441 Chronic obstructive pulmonary disease with (acute) exacerbation: Secondary | ICD-10-CM | POA: Insufficient documentation

## 2021-08-21 DIAGNOSIS — J449 Chronic obstructive pulmonary disease, unspecified: Secondary | ICD-10-CM | POA: Diagnosis not present

## 2021-08-21 DIAGNOSIS — F322 Major depressive disorder, single episode, severe without psychotic features: Secondary | ICD-10-CM

## 2021-08-21 MED ORDER — DOXYCYCLINE HYCLATE 100 MG PO TABS: 100.0000 mg | ORAL_TABLET | Freq: Two times a day (BID) | ORAL | 0 refills | Status: DC

## 2021-08-21 MED ORDER — DULOXETINE HCL 30 MG PO CPEP: 30.0000 mg | ORAL_CAPSULE | Freq: Every day | ORAL | 1 refills | Status: DC

## 2021-08-21 NOTE — Patient Instructions (Addendum)
Hi [name]  It was good to see you today. We will call you with the results of your blood work and your x-ray. The blood work may take 1 to 2 days to come back  Doxycycline 1 taken twice daily for 7 days with a snack and a tall glass of water for your bronchial infection  Duloxetine will be used in place of Effexor, 30 mg 1 daily  Please do a follow-up in 4 to 6 weeks either here or video  Let us know sooner if any problems  TakeCare-Dr. Nicki Reaper

## 2021-08-21 NOTE — Progress Notes (Signed)
5  Subjective:    Patient ID: [name], male    DOB: Aug 04, 1940, 81 y.o.   MRN: [id number]  Cough The current episode started 1 to 4 weeks ago.  States dry cough as well as coughing green mucous - 1.5 week COPD, on bipap machine, cogh keeping up at night , bilateral ankle swelling , exposure to pneumonia in home Very nice patient Very challenging situation Deals with depression on a regular basis mainly because he is outlived many of his friends and his some of his various relatives do not come and visit him His daughter does the best she can he stays with her If it was not for her help he would have significant issues  Patient has been having ongoing cough and congestion no PND sleeps propped coughing up a lot of yellow-green phlegm Review of Systems  Respiratory:  Positive for cough.        Objective:   Physical Exam No crackles heart regular pulse normal extremities significant edema is noted 1-2+ edema       Assessment & Plan:  1. DOE (dyspnea on exertion) We will check BNP.  X-ray did not show any CHF. - Basic metabolic panel - Brain natriuretic peptide - DG Chest 2 View  2. COPD with acute exacerbation ([place]) No pneumonia seen on x-ray.  Doxycycline prescribed for 7 days Follow-up again in 4 to 5 weeks - Basic metabolic panel - Brain natriuretic peptide - DG Chest 2 View  3. Pedal edema We need to check lab work first.  Depending on the lab work we may have to adjust the Lasix Also compression Nicki Reaper brings in keeping feet propped up can help - DG Chest 2 View  4. Depression, major, single episode, severe ([place]) Family did not start Cymbalta on the last visit stop Effexor.  Start Cymbalta 30 mg daily.  Follow-up in 4 to 5 weeks

## 2021-08-22 LAB — BRAIN NATRIURETIC PEPTIDE: BNP: 89.8 pg/mL (ref 0.0–100.0)

## 2021-08-22 LAB — BASIC METABOLIC PANEL
BUN/Creatinine Ratio: 14 (ref 10–24)
BUN: 16 mg/dL (ref 8–27)
CO2: 24 mmol/L (ref 20–29)
Calcium: 10.5 mg/dL — ABNORMAL HIGH (ref 8.6–10.2)
Chloride: 102 mmol/L (ref 96–106)
Creatinine, Ser: 1.12 mg/dL (ref 0.76–1.27)
Glucose: 122 mg/dL — ABNORMAL HIGH (ref 70–99)
Potassium: 4.6 mmol/L (ref 3.5–5.2)
Sodium: 141 mmol/L (ref 134–144)
eGFR: 66 mL/min/{1.73_m2} (ref >59)

## 2021-09-01 DIAGNOSIS — J449 Chronic obstructive pulmonary disease, unspecified: Secondary | ICD-10-CM | POA: Diagnosis not present

## 2021-09-07 NOTE — Telephone Encounter (Signed)
error 

## 2021-09-08 ENCOUNTER — Telehealth: Payer: Self-pay | Admitting: *Deleted

## 2021-09-08 NOTE — Telephone Encounter (Signed)
Rx from [facility]: Request Refill Venlafaxine 75 mg capsules #30 one tablet daily with breakfast- last filled 08/06/21- not on medlist -please advise

## 2021-09-09 NOTE — Telephone Encounter (Signed)
I do not recommend Effexor.  Patient is currently on Cymbalta which helps with discomfort as well as depression.  Patient has a follow-up visit at the end of August we will discuss how the Cymbalta is doing and possibly bump up the dose at that time

## 2021-09-11 ENCOUNTER — Telehealth: Payer: Self-pay | Admitting: Family Medicine

## 2021-09-11 MED ORDER — PRAVASTATIN SODIUM 40 MG PO TABS: ORAL_TABLET | ORAL | 1 refills | Status: DC

## 2021-09-11 MED ORDER — METOPROLOL TARTRATE 50 MG PO TABS: ORAL_TABLET | ORAL | 1 refills | Status: DC

## 2021-09-11 MED ORDER — DULOXETINE HCL 60 MG PO CPEP: ORAL_CAPSULE | ORAL | 3 refills | Status: DC

## 2021-09-11 NOTE — Telephone Encounter (Signed)
Pt daughter contacted and states they did not request refill.

## 2021-09-11 NOTE — Telephone Encounter (Signed)
Pt daughter contacted regarding rx request. Daughter states that she thinks dad is losing his mind. Daughter [name] states that her sister asked dad if he wanted to go [place] and he said no because he was tired due to he had been out working on a bridge. 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 Daughter also states his meds are not working. Daughter told pt they were planning a birthday party for him at [facility] and he said I dont want yall to plan anything for me but 15 minutes later he was inviting people. Pt has appt on 10/02/21. Please advise. Thank you

## 2021-09-11 NOTE — Addendum Note (Signed)
Addended by: Vicente Males on: 09/11/2021 02:42 PM   Modules accepted: Orders

## 2021-09-11 NOTE — Telephone Encounter (Signed)
Attempted to contact [name]; no answer

## 2021-09-11 NOTE — Telephone Encounter (Signed)
Pt daughter returned call and verbalized understanding. Sent in increased dose of Cymbalta and moved pt appt up to 09/20/21 at 1:50 pm.

## 2021-09-11 NOTE — Telephone Encounter (Signed)
At this point it is hard to know if an increased dose of Cymbalta would potentially help with depression But what they are describing sounds more like developing dementia and related issues  If they are interested in going up on dose of Cymbalta we can go from 30 mg to new dose of 60 mg #30 with 3 refills but if they would like to maintain the medicine as is until the follow-up visit that would be fine  He could be worked into a open slot sooner than the end of August somewhere within the next 2 weeks patient would prefer an afternoon visit given there location living in [place]

## 2021-09-13 DIAGNOSIS — H6123 Impacted cerumen, bilateral: Secondary | ICD-10-CM | POA: Diagnosis not present

## 2021-09-19 DIAGNOSIS — J9601 Acute respiratory failure with hypoxia: Secondary | ICD-10-CM | POA: Diagnosis not present

## 2021-09-19 DIAGNOSIS — J441 Chronic obstructive pulmonary disease with (acute) exacerbation: Secondary | ICD-10-CM | POA: Diagnosis not present

## 2021-09-19 DIAGNOSIS — R0902 Hypoxemia: Secondary | ICD-10-CM | POA: Diagnosis not present

## 2021-09-20 ENCOUNTER — Ambulatory Visit (INDEPENDENT_AMBULATORY_CARE_PROVIDER_SITE_OTHER): Payer: Medicare Other | Admitting: Family Medicine

## 2021-09-20 ENCOUNTER — Encounter: Payer: Self-pay | Admitting: Family Medicine

## 2021-09-20 VITALS — BP 110/64 | Wt 310.0 lb

## 2021-09-20 DIAGNOSIS — G4733 Obstructive sleep apnea (adult) (pediatric): Secondary | ICD-10-CM

## 2021-09-20 DIAGNOSIS — G4710 Hypersomnia, unspecified: Secondary | ICD-10-CM | POA: Diagnosis not present

## 2021-09-20 DIAGNOSIS — L219 Seborrheic dermatitis, unspecified: Secondary | ICD-10-CM

## 2021-09-20 DIAGNOSIS — I10 Essential (primary) hypertension: Secondary | ICD-10-CM | POA: Diagnosis not present

## 2021-09-20 DIAGNOSIS — F09 Unspecified mental disorder due to known physiological condition: Secondary | ICD-10-CM

## 2021-09-20 MED ORDER — TRIAMCINOLONE ACETONIDE 0.1 % EX CREA: 1.0000 | TOPICAL_CREAM | Freq: Two times a day (BID) | CUTANEOUS | 4 refills | Status: DC

## 2021-09-20 MED ORDER — MUPIROCIN 2 % EX OINT
1.0000 | TOPICAL_OINTMENT | Freq: Two times a day (BID) | CUTANEOUS | 5 refills | Status: DC | PRN
Start: 2021-09-20 — End: 2022-02-13

## 2021-09-20 NOTE — Progress Notes (Signed)
   Subjective:    Patient ID: [name], male    DOB: 20-Jul-1940, 81 y.o.   MRN: [id number]  HPI Pt here for follow up. Pt daughter states psoriasis has worsened.    Area on right shoulder is bothering patient; pt daughter would like referral to have it removed. Daughter states she can not tell a difference with increased does of Cymbalta.   Pt is sleeping more to the point where he will pass up meals to sleep.  Seborrheic dermatitis  Hypersomnolence  Obstructive sleep apnea  Essential hypertension  Morbid obesity ([name])  Cognitive dysfunction   Review of Systems     Objective:   Physical Exam .bno General-in no acute distress Eyes-no discharge Lungs-respiratory rate normal, CTA CV-no murmurs,RRR Extremities skin warm dry  Neuro grossly normal Behavior normal, alert Mild edema in the lower legs but not severe       Assessment & Plan:  1. Seborrheic dermatitis Recommend steroid cream twice daily as needed  2. Hypersomnolence Recommend blood gas may have hypercapnia with his COPD  3. Obstructive sleep apnea Continue his CPAP.  4. Essential hypertension Blood pressure decent control  5. Morbid obesity ([place]) Portion control healthy diet recommended  6. Cognitive dysfunction Scored very poor on [place] cognitive assessment.  I believe that this cognitive impairment could be Alzheimer's but could also be dementia non-Alzheimer's  Follow-up within 3 months hold off on medications currently

## 2021-09-22 ENCOUNTER — Other Ambulatory Visit: Payer: Self-pay | Admitting: *Deleted

## 2021-09-22 ENCOUNTER — Ambulatory Visit (HOSPITAL_COMMUNITY)
Admission: RE | Admit: 2021-09-22 | Discharge: 2021-09-22 | Disposition: A | Discharge status: Home / Self Care | Payer: Medicare Other | Source: Ambulatory Visit | Attending: Family Medicine | Admitting: Family Medicine

## 2021-09-22 DIAGNOSIS — G4710 Hypersomnia, unspecified: Secondary | ICD-10-CM

## 2021-09-22 DIAGNOSIS — J441 Chronic obstructive pulmonary disease with (acute) exacerbation: Secondary | ICD-10-CM | POA: Diagnosis not present

## 2021-09-22 LAB — BLOOD GAS, ARTERIAL
Acid-Base Excess: 3.5 mmol/L — ABNORMAL HIGH (ref 0.0–2.0)
Bicarbonate: 27.8 mmol/L (ref 20.0–28.0)
Drawn by: 27016
FIO2: 21 %
O2 Saturation: 95.5 %
Patient temperature: 37
pCO2 arterial: 40 mmHg (ref 32–48)
pH, Arterial: 7.45 (ref 7.35–7.45)
pO2, Arterial: 74 mmHg — ABNORMAL LOW (ref 83–108)

## 2021-09-26 DIAGNOSIS — J449 Chronic obstructive pulmonary disease, unspecified: Secondary | ICD-10-CM | POA: Diagnosis not present

## 2021-10-02 ENCOUNTER — Ambulatory Visit: Payer: Medicare Other | Admitting: Family Medicine

## 2021-10-02 DIAGNOSIS — J449 Chronic obstructive pulmonary disease, unspecified: Secondary | ICD-10-CM | POA: Diagnosis not present

## 2021-10-05 ENCOUNTER — Other Ambulatory Visit: Payer: Self-pay | Admitting: Physician Assistant

## 2021-10-11 ENCOUNTER — Ambulatory Visit: Payer: Self-pay | Admitting: Family Medicine

## 2021-10-20 DIAGNOSIS — J441 Chronic obstructive pulmonary disease with (acute) exacerbation: Secondary | ICD-10-CM | POA: Diagnosis not present

## 2021-10-20 DIAGNOSIS — R0902 Hypoxemia: Secondary | ICD-10-CM | POA: Diagnosis not present

## 2021-10-20 DIAGNOSIS — J9601 Acute respiratory failure with hypoxia: Secondary | ICD-10-CM | POA: Diagnosis not present

## 2021-10-30 ENCOUNTER — Ambulatory Visit: Payer: Medicare Other | Admitting: Podiatry

## 2021-10-30 ENCOUNTER — Encounter: Payer: Self-pay | Admitting: Podiatry

## 2021-10-30 DIAGNOSIS — B351 Tinea unguium: Secondary | ICD-10-CM

## 2021-10-30 DIAGNOSIS — M79676 Pain in unspecified toe(s): Secondary | ICD-10-CM | POA: Diagnosis not present

## 2021-10-30 NOTE — Progress Notes (Signed)
This patient presents to the office with chief complaint of long thick painful nails.  Patient says the nails are painful walking and wearing shoes.  This patient is unable to self treat.  This patient is unable to trim hisnails since she is unable to reach his nails.  he presents to the office for preventative foot care services.  General Appearance  Alert, conversant and in no acute stress.  Vascular  Dorsalis pedis and posterior tibial  pulses are palpable  bilaterally.  Capillary return is within normal limits  bilaterally. Temperature is within normal limits  bilaterally.  Neurologic  Senn-Weinstein monofilament wire test within normal limits  bilaterally. Muscle power within normal limits bilaterally.  Nails Thick disfigured discolored nails with subungual debris  from hallux to fifth toes bilaterally. No evidence of bacterial infection or drainage bilaterally.  Orthopedic  No limitations of motion  feet .  No crepitus or effusions noted.  No bony pathology or digital deformities noted.  Skin  normotropic skin with no porokeratosis noted bilaterally.  No signs of infections or ulcers noted.     Onychomycosis  Nails  B/L.  Pain in right toes  Pain in left toes  Debridement of nails both feet followed trimming the nails with dremel tool.    RTC 3 months.   Gardiner Barefoot DPM

## 2021-11-02 DIAGNOSIS — M25562 Pain in left knee: Secondary | ICD-10-CM | POA: Diagnosis not present

## 2021-11-02 DIAGNOSIS — Z96659 Presence of unspecified artificial knee joint: Secondary | ICD-10-CM | POA: Diagnosis not present

## 2021-11-02 DIAGNOSIS — J449 Chronic obstructive pulmonary disease, unspecified: Secondary | ICD-10-CM | POA: Diagnosis not present

## 2021-11-19 DIAGNOSIS — J9601 Acute respiratory failure with hypoxia: Secondary | ICD-10-CM | POA: Diagnosis not present

## 2021-11-19 DIAGNOSIS — J441 Chronic obstructive pulmonary disease with (acute) exacerbation: Secondary | ICD-10-CM | POA: Diagnosis not present

## 2021-11-19 DIAGNOSIS — R0902 Hypoxemia: Secondary | ICD-10-CM | POA: Diagnosis not present

## 2021-11-23 DIAGNOSIS — G4733 Obstructive sleep apnea (adult) (pediatric): Secondary | ICD-10-CM | POA: Diagnosis not present

## 2021-12-02 DIAGNOSIS — J449 Chronic obstructive pulmonary disease, unspecified: Secondary | ICD-10-CM | POA: Diagnosis not present

## 2021-12-06 ENCOUNTER — Telehealth: Payer: Self-pay | Admitting: Family Medicine

## 2021-12-06 ENCOUNTER — Other Ambulatory Visit: Payer: Self-pay

## 2021-12-06 MED ORDER — PRAVASTATIN SODIUM 40 MG PO TABS: ORAL_TABLET | ORAL | 1 refills | Status: DC

## 2021-12-06 NOTE — Telephone Encounter (Signed)
Patient is requesting refill onpravastatin (PRAVACHOL) 40 MG tablet  called into [facility] [address] [facility] [address]. He is complete out . Daughter stated his feet are swelling also.

## 2021-12-06 NOTE — Telephone Encounter (Signed)
Nurses It would be fine to send in 6 months refill on Pravachol If swelling in the lower legs is severe it is fine to utilize 2 tablets otherwise 1-1/2 a tablet When he comes for his visit on the 16th I would recommend bringing all of his medicines so we can review these  As for the legs if the swelling does not improve with 2 tablets over the next week especially if shortness of breath let us know if necessary we can see him sooner

## 2021-12-06 NOTE — Telephone Encounter (Signed)
Pt daughter [name] contacted and verbalized understanding.

## 2021-12-06 NOTE — Telephone Encounter (Signed)
Daughter Angie contacted. Refill sent in as requested. Daughter states pt feet are swelling. Daughter has been using Lasix and doubles up at times. Daughter is wanting to know what provider thinks. Pt has appt on 12/21/21. Please advise. Thank you

## 2021-12-20 DIAGNOSIS — J9601 Acute respiratory failure with hypoxia: Secondary | ICD-10-CM | POA: Diagnosis not present

## 2021-12-20 DIAGNOSIS — R0902 Hypoxemia: Secondary | ICD-10-CM | POA: Diagnosis not present

## 2021-12-20 DIAGNOSIS — J441 Chronic obstructive pulmonary disease with (acute) exacerbation: Secondary | ICD-10-CM | POA: Diagnosis not present

## 2021-12-21 ENCOUNTER — Ambulatory Visit (INDEPENDENT_AMBULATORY_CARE_PROVIDER_SITE_OTHER): Payer: Medicare Other | Admitting: Family Medicine

## 2021-12-21 VITALS — BP 130/74 | HR 70 | Temp 97.7°F | Ht 73.0 in | Wt 315.0 lb

## 2021-12-21 DIAGNOSIS — E785 Hyperlipidemia, unspecified: Secondary | ICD-10-CM

## 2021-12-21 DIAGNOSIS — F32A Depression, unspecified: Secondary | ICD-10-CM

## 2021-12-21 DIAGNOSIS — I10 Essential (primary) hypertension: Secondary | ICD-10-CM | POA: Diagnosis not present

## 2021-12-21 DIAGNOSIS — F419 Anxiety disorder, unspecified: Secondary | ICD-10-CM | POA: Diagnosis not present

## 2021-12-21 DIAGNOSIS — Z23 Encounter for immunization: Secondary | ICD-10-CM

## 2021-12-21 DIAGNOSIS — G4733 Obstructive sleep apnea (adult) (pediatric): Secondary | ICD-10-CM

## 2021-12-21 DIAGNOSIS — R1314 Dysphagia, pharyngoesophageal phase: Secondary | ICD-10-CM

## 2021-12-21 DIAGNOSIS — G4710 Hypersomnia, unspecified: Secondary | ICD-10-CM

## 2021-12-21 NOTE — Progress Notes (Signed)
   Subjective:    Patient ID: [name], male    DOB: 21-Dec-1940, 81 y.o.   MRN: [id number]  HPI  3 month follow chronic medica;  Nausea with some vomiting on and off since friday  Review of Systems     Objective:   Physical Exam  Lungs clear heart regular some edema noted in the ankles and feet      Assessment & Plan:  1. Essential hypertension Blood pressure decent control continue current measures - Basic Metabolic Panel - Lipid Panel - Hepatic Function Panel  2. Obstructive sleep apnea Patient currently using BiPAP continue this  3. Pharyngoesophageal dysphagia Patient apparently coughs or chokes every time he eats or drinks he needs a referral to speech therapy hopefully they can do a swallowing study. - Ambulatory referral to Speech Therapy  4. Anxiety and depression Continue antidepressants but he would benefit from counseling Family is also looking at possibly utilizing a day adult center I believe he would benefit from this from the socialization - Ambulatory referral to Psychology  5. Hyperlipidemia, unspecified hyperlipidemia type Continue current measures healthy diet check labs - Basic Metabolic Panel - Lipid Panel - Hepatic Function Panel  6. Hypersomnolence This is become more prominent as he has gotten older I believe his weight is contributing to this causing some relative hypercapnia more than likely causing some hypersomnolence during the day - Ambulatory referral to Psychology  7. Immunization due Vaccines today - Pneumococcal conjugate vaccine 20-valent (Prevnar 20) - Flu Vaccine QUAD High Dose(Fluad)  Continue compression stockings in the lower legs he is currently using compression socks  Follow-up within 3 months

## 2021-12-22 LAB — HEPATIC FUNCTION PANEL
ALT: 48 [IU]/L — ABNORMAL HIGH (ref 0–44)
AST: 55 [IU]/L — ABNORMAL HIGH (ref 0–40)
Albumin: 4.1 g/dL (ref 3.7–4.7)
Alkaline Phosphatase: 118 [IU]/L (ref 44–121)
Bilirubin Total: 0.3 mg/dL (ref 0.0–1.2)
Bilirubin, Direct: 0.15 mg/dL (ref 0.00–0.40)
Total Protein: 7.3 g/dL (ref 6.0–8.5)

## 2021-12-22 LAB — LIPID PANEL
Chol/HDL Ratio: 3.9 ratio (ref 0.0–5.0)
Cholesterol, Total: 198 mg/dL (ref 100–199)
HDL: 51 mg/dL (ref >39)
LDL Chol Calc (NIH): 98 mg/dL (ref 0–99)
Triglycerides: 294 mg/dL — ABNORMAL HIGH (ref 0–149)
VLDL Cholesterol Cal: 49 mg/dL — ABNORMAL HIGH (ref 5–40)

## 2021-12-22 LAB — BASIC METABOLIC PANEL WITH GFR
BUN/Creatinine Ratio: 16 (ref 10–24)
BUN: 18 mg/dL (ref 8–27)
CO2: 25 mmol/L (ref 20–29)
Calcium: 10.6 mg/dL — ABNORMAL HIGH (ref 8.6–10.2)
Chloride: 104 mmol/L (ref 96–106)
Creatinine, Ser: 1.15 mg/dL (ref 0.76–1.27)
Glucose: 95 mg/dL (ref 70–99)
Potassium: 4.8 mmol/L (ref 3.5–5.2)
Sodium: 143 mmol/L (ref 134–144)
eGFR: 63 mL/min/{1.73_m2}

## 2022-01-02 DIAGNOSIS — J449 Chronic obstructive pulmonary disease, unspecified: Secondary | ICD-10-CM | POA: Diagnosis not present

## 2022-01-03 ENCOUNTER — Other Ambulatory Visit: Payer: Self-pay | Admitting: Family Medicine

## 2022-01-08 ENCOUNTER — Encounter: Payer: Self-pay | Admitting: Speech Pathology

## 2022-01-08 ENCOUNTER — Other Ambulatory Visit: Payer: Self-pay

## 2022-01-08 ENCOUNTER — Ambulatory Visit: Payer: Medicare Other | Attending: Family Medicine | Admitting: Speech Pathology

## 2022-01-08 DIAGNOSIS — R1312 Dysphagia, oropharyngeal phase: Secondary | ICD-10-CM | POA: Insufficient documentation

## 2022-01-08 NOTE — Therapy (Signed)
OUTPATIENT SPEECH LANGUAGE PATHOLOGY SWALLOW EVALUATION   Patient Name: [name] MRN: [id number] DOB:[birth year], 81 y.o., male Today's Date: 01/08/2022  PCP: Sallee Lange MD REFERRING PROVIDER: Sallee Lange MD  END OF SESSION:  End of Session - 01/08/22 1426     Visit Number 1    Number of Visits 25    Date for SLP Re-Evaluation 04/02/22    SLP Start Time 10    SLP Stop Time  0109    SLP Time Calculation (min) 45 min    Activity Tolerance Patient tolerated treatment well             Past Medical History:  Diagnosis Date   Ankylosing spondylitis ([place]) 11/07/2018   Asthma    BPH (benign prostatic hyperplasia)    CAD (coronary artery disease) 01/1995   CHF (congestive heart failure) ([place])    Clostridium difficile colitis 04/2005   Colitis 2011   COPD (chronic obstructive pulmonary disease) ([place])    Depression    Diverticulosis    DJD (degenerative joint disease)    Gastric ulcer 04/17/10   Three 22m gastric ulcers, H.pylori serologies were negative   GERD (gastroesophageal reflux disease)    History of kidney stones    Hyperlipidemia    Hypertension    Idiopathic chronic inflammatory bowel disease 05/18/2010   left-sided UC   Kidney stone    Morbid obesity ([place] 03/12/2018   Obstructive sleep apnea    on Cpap   Reflux 02/1995   S/P endoscopy 07/24/10   retained gastric contents, benign bx   Past Surgical History:  Procedure Laterality Date   ANORECTAL MANOMETRY  2016   [facility]: concern for possible fissure. Noted pelvic floor dyssnyergy   BIOPSY  07/29/2017   Procedure: BIOPSY;  Surgeon: RDaneil Dolin MD;  Location: [facility];  Service: Endoscopy;;  ascending and sigmoid colon   CARDIAC CATHETERIZATION     with stent   CARDIOVASCULAR STRESS TEST  07/21/2009   No scintigraphic evidence of inducible myocardial ischemia   CARPAL TUNNEL RELEASE Left 01/17/2017   Procedure: LEFT CARPAL TUNNEL RELEASE;  Surgeon: HCarole Civil MD;  Location:  [facility];  Service: Orthopedics;  Laterality: Left;   CATARACT EXTRACTION, BILATERAL Bilateral    CERVICAL SPINE SURGERY     C4-5   COLONOSCOPY  04/2005   granularity and friability erosions from rectum to 40cm. Bx infection vs IBD. C. Diff positive at the time.    COLONOSCOPY  05/2010   [place]: left-sided UC, bx with no dysplasia, shallow diverticula   COLONOSCOPY [name] 11/07/2012   RNAT:FTDD-UKGURproctocolitis status post segmental biopsy/Sigmoid colon polyps removed as described above. Procedure compromisd by technical difficulties. bx: Inflammation limited to sigmoid and rectum on pathology.   COLONOSCOPY  04/2014   [facility] well localized proctocolitis limited to sigmoid   COLONOSCOPY WITH PROPOFOL [name] 07/29/2017   diverticulosis in colon, three 4-6 mm polyps at splenic flexure and in cecum, one 10 mm polyp in rectum, abnormal rectum and sigmoid consistent with active UC   CORONARY STENT PLACEMENT  01/1995   ESOPHAGOGASTRODUODENOSCOPY  07/24/2010   RKYH:CWCBJSesophagus   ESOPHAGOGASTRODUODENOSCOPY  04/2014   [facility] small bowel biopsies   FOOT SURGERY Bilateral two   KNEE SURGERY  two   POLYPECTOMY  07/29/2017   Procedure: POLYPECTOMY;  Surgeon: RDaneil Dolin MD;  [facility];  Service: Endoscopy;;  splenic flexure, ascending colon polyp;rectal   SHOULDER SURGERY  two   TONSILLECTOMY     TRANSTHORACIC ECHOCARDIOGRAM  03/23/2009   EF 60-65%, normal LV systolic function   ULNAR HEAD EXCISION Left 01/17/2017   Procedure: LEFT ULNAR HEAD RESECTION;  Surgeon: Carole Civil, MD;  Location: [facility];  Service: Orthopedics;  Laterality: Left;   Patient Active Problem List   Diagnosis Date Noted   Depression, major, single episode, moderate ([place]) 02/20/2021   Hematochezia 01/26/2021   Syncope 10/29/2020   Class 3 obesity ([place]) 10/29/2020   Head concussion 10/29/2020   Bilateral hand fractures 10/29/2020   Dysphagia 07/19/2020   Status  post cervical spinal fusion 07/19/2020   Cervical spondylosis 03/07/2020   DNR (do not resuscitate) 08/17/2019   Shortness of breath 04/09/2019   COPD (chronic obstructive pulmonary disease) ([place]) 04/09/2019   Chronic ulcerative proctosigmoiditis, without complications ([place]) 70/26/3785   Chronic respiratory failure with hypoxia ([place]) 04/09/2019   Obesity with body mass index (BMI) of 30.0 to 39.9 04/09/2019   Acute on chronic respiratory failure with hypoxia and hypercapnia ([place]) 12/23/2018   Acute respiratory failure with hypoxia ([place]) 12/21/2018   Ankylosing spondylitis ([place]) 11/07/2018   Disseminated idiopathic skeletal hyperostosis 10/22/2018   Neck pain 10/22/2018   Rectal bleeding 08/26/2018   Chronic diastolic CHF (congestive heart failure) ([place]) 03/27/2018   Exertional dyspnea 03/15/2018   Morbid obesity ([place]) 03/12/2018   History of carpal tunnel surgery of left wrist with unlar head resection 01/17/17 01/24/2017   Closed fracture of distal radius and ulna, left, with malunion, subsequent encounter    Carpal tunnel syndrome of left wrist    Fracture, [place], left, closed 11/29/2016   Hx of skin cancer, basal cell 11/30/2015   Erectile dysfunction 06/06/2015   Iron deficiency 08/18/2014   Fecal incontinence 02/10/2014   Essential hypertension 01/09/2013   Dyslipidemia, goal LDL below 70 01/09/2013   Chest pain at rest 12/14/2012   CAD S/P percutaneous coronary angioplasty 12/14/2012   Hiatal hernia 12/14/2012   GERD (gastroesophageal reflux disease) 12/14/2012   Prediabetes 12/09/2012   BPH (benign prostatic hyperplasia) 11/18/2012   RUQ pain 09/04/2012   Gallstones 05/09/2012   Obstructive sleep apnea 04/30/2012   Ulcerative colitis ([place]) 04/29/2012   Ulcerative colitis, left sided ([place]) 12/26/2011   Diarrhea 10/18/2010    ONSET DATE: 12/21/2021   REFERRING DIAG: R13.14 (ICD-10-CM) - Pharyngoesophageal dysphagia  THERAPY DIAG:  Dysphagia, oropharyngeal phase -  Plan: SLP modified barium swallow  Rationale for Evaluation and Treatment: Rehabilitation  SUBJECTIVE:   SUBJECTIVE STATEMENT: "I get choked and I can't stop coughing" Pt accompanied by:  son in law, Marcello Moores  PERTINENT HISTORY: 81 y.o. male with general deconditioning and weight gain. Minimal physical activity at home. COPD with SOB, on Bipap. Coughing with meals for about 6 months per pt and family. Not following family or caregiver instructions to move every 2 hours and use bathroom every 2 hours. Frequently has urinary and BM accidents.   PAIN:  Are you having pain? No  FALLS: Has patient fallen in last 6 months?  Yes, 3-4 times  LIVING ENVIRONMENT: Lives with: lives with their family, has [employer] 8 hours a week Lives in: House/apartment  PLOF:  Level of assistance: Needed assistance with ADLs, Needed assistance with IADLS Employment: Retired  PATIENT GOALS: "to stop coughgin"  OBJECTIVE:     COGNITION: Overall cognitive status: Impaired Areas of impairment:  Memory: Impaired: Working Agricultural engineer Functional deficits: family is managing appointments and medical information  ORAL MOTOR EXAMINATION: Overall  status: WFL Comments: eats with upper dentures only, edentulous lower, dentures falling today, not secured  CLINICAL SWALLOW ASSESSMENT:   Current diet: regular and thin liquids Dentition: edentulous Patient directly observed with POs: Yes: dysphagia 3 (soft) and thin liquids  Feeding: able to feed self Liquids provided by: cup Oral phase signs and symptoms:  diffuse oral residue Pharyngeal phase signs and symptoms: wet vocal quality and immediate cough  TREATMENT:                                                                                                                                         DATE: 01/08/22 (eval day): Reviewed safe swallow precautions including eliminating distractions, eating upright not reclined, swallowing with intent and not  talking while eating.   PATIENT EDUCATION: Education details: See today's treatment and patient instructions Person educated: Patient and Child(ren) Education method: Explanation and Verbal cues Education comprehension: verbalized understanding   ASSESSMENT:  CLINICAL IMPRESSION: Patient is a 81  y.o. male who was seen today for dsphagia. Clinical swallow eval revealed mild oral phase dysphagia with diffuse oral residue and prolonged mastication (loose dentures, edentulous lower) and overt s/s of aspiration with immediate coughing 5/7 after solids followed by liquids. I allowed Rudolph to eat like he normally does. He consistently followed solids with liquid sip. Amin was SOB throughout eval, and conversed with oral residue present. Educated pt and family on risk of inhaling any oral or pharyngeal residue due to SOB, or inhaling primary bolus if he is very SOB during meals. They report meds with thin liquids do not result in coughing or choking. I recommend MBSS to asses pharyngeal function and benefit of postural or compensatory strategies. Pending results of MBSS skilled outpt ST may be warranted to maximize safety of swallow   OBJECTIVE IMPAIRMENTS: include dysphagia. These impairments are limiting patient from safety when swallowing. Factors affecting potential to achieve goals and functional outcome are cooperation/participation level. Patient will benefit from skilled SLP services to address above impairments and improve overall function.  REHAB POTENTIAL: Good   GOALS: Goals reviewed with patient? Yes  SHORT TERM GOALS: Target date: 02/05/22  Pt will complete HEP for dysphagia with occasional min A Baseline: Goal status: INITIAL  2.  Pt / caregivers will follow diet modificiations with rare min A Baseline:  Goal status: INITIAL  3.  Pt / caregivers will follow swallow precautions with occasional min A Baseline:  Goal status: INITIAL  4.  Pt / caregiver will report coughing  with PO 25% reduced subjectively Baseline:  Goal status: INITIAL   LONG TERM GOALS: Target date: 04/02/21  Pt will complete HEP for dysphagia with supervision cues Baseline:  Goal status: INITIAL  2.  Pt will follow swallow precautions with rare min A Baseline:  Goal status: INITIAL  3.  Pt /caregivers will follow diet modifications with mod I Baseline:  Goal status: INITIAL  4.  Pt will tolerate least restrictive diet with no overt s/s of aspiration Baseline:  Goal status: INITIAL   PLAN:  SLP FREQUENCY: 2x/week  SLP DURATION: 12 weeks  PLANNED INTERVENTIONS: Aspiration precaution training, Pharyngeal strengthening exercises, Diet toleration management , Environmental controls, Trials of upgraded texture/liquids, Cueing hierachy, Internal/external aids, Functional tasks, and SLP instruction and feedback    [place], Annye Rusk, [place] 01/08/2022, 3:06 PM

## 2022-01-09 ENCOUNTER — Telehealth (HOSPITAL_COMMUNITY): Payer: Self-pay

## 2022-01-09 NOTE — Telephone Encounter (Signed)
Attempted to contact patient to schedule Modified Barium Swallow - left voicemail.

## 2022-01-11 ENCOUNTER — Other Ambulatory Visit (HOSPITAL_COMMUNITY): Payer: Self-pay

## 2022-01-11 DIAGNOSIS — R1310 Dysphagia, unspecified: Secondary | ICD-10-CM

## 2022-01-15 DIAGNOSIS — J449 Chronic obstructive pulmonary disease, unspecified: Secondary | ICD-10-CM | POA: Diagnosis not present

## 2022-01-16 ENCOUNTER — Emergency Department (HOSPITAL_COMMUNITY): Payer: Medicare Other

## 2022-01-16 ENCOUNTER — Encounter (HOSPITAL_COMMUNITY): Payer: Self-pay | Admitting: Emergency Medicine

## 2022-01-16 ENCOUNTER — Inpatient Hospital Stay (HOSPITAL_COMMUNITY)
Admission: EM | Admit: 2022-01-16 | Discharge: 2022-01-26 | DRG: 193 | Disposition: A | Discharge status: Skilled Nursing Facility | Payer: Medicare Other | Attending: Internal Medicine | Admitting: Internal Medicine

## 2022-01-16 DIAGNOSIS — R1312 Dysphagia, oropharyngeal phase: Secondary | ICD-10-CM | POA: Diagnosis present

## 2022-01-16 DIAGNOSIS — N200 Calculus of kidney: Secondary | ICD-10-CM | POA: Diagnosis present

## 2022-01-16 DIAGNOSIS — Z955 Presence of coronary angioplasty implant and graft: Secondary | ICD-10-CM

## 2022-01-16 DIAGNOSIS — R918 Other nonspecific abnormal finding of lung field: Secondary | ICD-10-CM | POA: Diagnosis not present

## 2022-01-16 DIAGNOSIS — Z801 Family history of malignant neoplasm of trachea, bronchus and lung: Secondary | ICD-10-CM

## 2022-01-16 DIAGNOSIS — J449 Chronic obstructive pulmonary disease, unspecified: Secondary | ICD-10-CM | POA: Diagnosis present

## 2022-01-16 DIAGNOSIS — I10 Essential (primary) hypertension: Secondary | ICD-10-CM | POA: Diagnosis not present

## 2022-01-16 DIAGNOSIS — R935 Abnormal findings on diagnostic imaging of other abdominal regions, including retroperitoneum: Secondary | ICD-10-CM | POA: Diagnosis not present

## 2022-01-16 DIAGNOSIS — E785 Hyperlipidemia, unspecified: Secondary | ICD-10-CM | POA: Diagnosis not present

## 2022-01-16 DIAGNOSIS — R410 Disorientation, unspecified: Secondary | ICD-10-CM | POA: Diagnosis not present

## 2022-01-16 DIAGNOSIS — E038 Other specified hypothyroidism: Secondary | ICD-10-CM

## 2022-01-16 DIAGNOSIS — N1831 Chronic kidney disease, stage 3a: Secondary | ICD-10-CM | POA: Insufficient documentation

## 2022-01-16 DIAGNOSIS — J9611 Chronic respiratory failure with hypoxia: Secondary | ICD-10-CM | POA: Diagnosis present

## 2022-01-16 DIAGNOSIS — Z6841 Body Mass Index (BMI) 40.0 and over, adult: Secondary | ICD-10-CM | POA: Diagnosis not present

## 2022-01-16 DIAGNOSIS — R1314 Dysphagia, pharyngoesophageal phase: Secondary | ICD-10-CM | POA: Diagnosis present

## 2022-01-16 DIAGNOSIS — Z743 Need for continuous supervision: Secondary | ICD-10-CM | POA: Diagnosis not present

## 2022-01-16 DIAGNOSIS — I160 Hypertensive urgency: Secondary | ICD-10-CM | POA: Diagnosis present

## 2022-01-16 DIAGNOSIS — Z66 Do not resuscitate: Secondary | ICD-10-CM | POA: Diagnosis not present

## 2022-01-16 DIAGNOSIS — Z9981 Dependence on supplemental oxygen: Secondary | ICD-10-CM

## 2022-01-16 DIAGNOSIS — Z9861 Coronary angioplasty status: Secondary | ICD-10-CM

## 2022-01-16 DIAGNOSIS — R2689 Other abnormalities of gait and mobility: Secondary | ICD-10-CM | POA: Diagnosis not present

## 2022-01-16 DIAGNOSIS — R0902 Hypoxemia: Secondary | ICD-10-CM | POA: Diagnosis not present

## 2022-01-16 DIAGNOSIS — Z7982 Long term (current) use of aspirin: Secondary | ICD-10-CM

## 2022-01-16 DIAGNOSIS — E611 Iron deficiency: Secondary | ICD-10-CM

## 2022-01-16 DIAGNOSIS — J9811 Atelectasis: Secondary | ICD-10-CM | POA: Diagnosis present

## 2022-01-16 DIAGNOSIS — Z1152 Encounter for screening for COVID-19: Secondary | ICD-10-CM

## 2022-01-16 DIAGNOSIS — J810 Acute pulmonary edema: Secondary | ICD-10-CM | POA: Diagnosis not present

## 2022-01-16 DIAGNOSIS — K222 Esophageal obstruction: Secondary | ICD-10-CM | POA: Diagnosis present

## 2022-01-16 DIAGNOSIS — J189 Pneumonia, unspecified organism: Secondary | ICD-10-CM | POA: Diagnosis not present

## 2022-01-16 DIAGNOSIS — J13 Pneumonia due to Streptococcus pneumoniae: Secondary | ICD-10-CM | POA: Diagnosis not present

## 2022-01-16 DIAGNOSIS — I5033 Acute on chronic diastolic (congestive) heart failure: Secondary | ICD-10-CM | POA: Insufficient documentation

## 2022-01-16 DIAGNOSIS — E66813 Obesity, class 3: Secondary | ICD-10-CM | POA: Diagnosis present

## 2022-01-16 DIAGNOSIS — I509 Heart failure, unspecified: Secondary | ICD-10-CM | POA: Diagnosis not present

## 2022-01-16 DIAGNOSIS — Z79899 Other long term (current) drug therapy: Secondary | ICD-10-CM | POA: Diagnosis not present

## 2022-01-16 DIAGNOSIS — Z841 Family history of disorders of kidney and ureter: Secondary | ICD-10-CM

## 2022-01-16 DIAGNOSIS — R7989 Other specified abnormal findings of blood chemistry: Secondary | ICD-10-CM | POA: Diagnosis present

## 2022-01-16 DIAGNOSIS — K224 Dyskinesia of esophagus: Secondary | ICD-10-CM | POA: Diagnosis not present

## 2022-01-16 DIAGNOSIS — K449 Diaphragmatic hernia without obstruction or gangrene: Secondary | ICD-10-CM | POA: Diagnosis not present

## 2022-01-16 DIAGNOSIS — R338 Other retention of urine: Secondary | ICD-10-CM | POA: Diagnosis not present

## 2022-01-16 DIAGNOSIS — K219 Gastro-esophageal reflux disease without esophagitis: Secondary | ICD-10-CM | POA: Diagnosis present

## 2022-01-16 DIAGNOSIS — I5A Non-ischemic myocardial injury (non-traumatic): Secondary | ICD-10-CM | POA: Insufficient documentation

## 2022-01-16 DIAGNOSIS — K8020 Calculus of gallbladder without cholecystitis without obstruction: Secondary | ICD-10-CM

## 2022-01-16 DIAGNOSIS — I517 Cardiomegaly: Secondary | ICD-10-CM | POA: Diagnosis not present

## 2022-01-16 DIAGNOSIS — I7789 Other specified disorders of arteries and arterioles: Secondary | ICD-10-CM | POA: Diagnosis not present

## 2022-01-16 DIAGNOSIS — R278 Other lack of coordination: Secondary | ICD-10-CM | POA: Diagnosis not present

## 2022-01-16 DIAGNOSIS — K2970 Gastritis, unspecified, without bleeding: Secondary | ICD-10-CM | POA: Diagnosis present

## 2022-01-16 DIAGNOSIS — R6889 Other general symptoms and signs: Secondary | ICD-10-CM | POA: Diagnosis not present

## 2022-01-16 DIAGNOSIS — J9621 Acute and chronic respiratory failure with hypoxia: Secondary | ICD-10-CM | POA: Diagnosis present

## 2022-01-16 DIAGNOSIS — J432 Centrilobular emphysema: Secondary | ICD-10-CM | POA: Diagnosis not present

## 2022-01-16 DIAGNOSIS — I110 Hypertensive heart disease with heart failure: Secondary | ICD-10-CM | POA: Diagnosis not present

## 2022-01-16 DIAGNOSIS — Z8249 Family history of ischemic heart disease and other diseases of the circulatory system: Secondary | ICD-10-CM

## 2022-01-16 DIAGNOSIS — N179 Acute kidney failure, unspecified: Secondary | ICD-10-CM | POA: Diagnosis not present

## 2022-01-16 DIAGNOSIS — Z87891 Personal history of nicotine dependence: Secondary | ICD-10-CM

## 2022-01-16 DIAGNOSIS — K5190 Ulcerative colitis, unspecified, without complications: Secondary | ICD-10-CM | POA: Diagnosis present

## 2022-01-16 DIAGNOSIS — F0393 Unspecified dementia, unspecified severity, with mood disturbance: Secondary | ICD-10-CM | POA: Diagnosis not present

## 2022-01-16 DIAGNOSIS — J439 Emphysema, unspecified: Secondary | ICD-10-CM | POA: Diagnosis not present

## 2022-01-16 DIAGNOSIS — K2289 Other specified disease of esophagus: Secondary | ICD-10-CM | POA: Diagnosis present

## 2022-01-16 DIAGNOSIS — Z981 Arthrodesis status: Secondary | ICD-10-CM

## 2022-01-16 DIAGNOSIS — E876 Hypokalemia: Secondary | ICD-10-CM | POA: Diagnosis not present

## 2022-01-16 DIAGNOSIS — M6281 Muscle weakness (generalized): Secondary | ICD-10-CM | POA: Diagnosis not present

## 2022-01-16 DIAGNOSIS — I2510 Atherosclerotic heart disease of native coronary artery without angina pectoris: Secondary | ICD-10-CM | POA: Diagnosis not present

## 2022-01-16 DIAGNOSIS — R7303 Prediabetes: Secondary | ICD-10-CM

## 2022-01-16 DIAGNOSIS — J440 Chronic obstructive pulmonary disease with acute lower respiratory infection: Secondary | ICD-10-CM | POA: Diagnosis not present

## 2022-01-16 DIAGNOSIS — N401 Enlarged prostate with lower urinary tract symptoms: Secondary | ICD-10-CM | POA: Diagnosis present

## 2022-01-16 DIAGNOSIS — G4733 Obstructive sleep apnea (adult) (pediatric): Secondary | ICD-10-CM | POA: Diagnosis present

## 2022-01-16 DIAGNOSIS — R1319 Other dysphagia: Secondary | ICD-10-CM | POA: Diagnosis not present

## 2022-01-16 DIAGNOSIS — I169 Hypertensive crisis, unspecified: Secondary | ICD-10-CM

## 2022-01-16 DIAGNOSIS — I774 Celiac artery compression syndrome: Secondary | ICD-10-CM | POA: Diagnosis not present

## 2022-01-16 DIAGNOSIS — J9690 Respiratory failure, unspecified, unspecified whether with hypoxia or hypercapnia: Secondary | ICD-10-CM | POA: Diagnosis not present

## 2022-01-16 DIAGNOSIS — K2980 Duodenitis without bleeding: Secondary | ICD-10-CM | POA: Diagnosis present

## 2022-01-16 DIAGNOSIS — Z833 Family history of diabetes mellitus: Secondary | ICD-10-CM

## 2022-01-16 DIAGNOSIS — J9601 Acute respiratory failure with hypoxia: Secondary | ICD-10-CM | POA: Diagnosis not present

## 2022-01-16 DIAGNOSIS — Z85828 Personal history of other malignant neoplasm of skin: Secondary | ICD-10-CM

## 2022-01-16 DIAGNOSIS — F321 Major depressive disorder, single episode, moderate: Secondary | ICD-10-CM | POA: Diagnosis present

## 2022-01-16 DIAGNOSIS — Z7401 Bed confinement status: Secondary | ICD-10-CM | POA: Diagnosis not present

## 2022-01-16 DIAGNOSIS — K5730 Diverticulosis of large intestine without perforation or abscess without bleeding: Secondary | ICD-10-CM | POA: Diagnosis not present

## 2022-01-16 DIAGNOSIS — R1310 Dysphagia, unspecified: Secondary | ICD-10-CM | POA: Diagnosis not present

## 2022-01-16 DIAGNOSIS — K2990 Gastroduodenitis, unspecified, without bleeding: Secondary | ICD-10-CM | POA: Diagnosis not present

## 2022-01-16 DIAGNOSIS — J90 Pleural effusion, not elsewhere classified: Secondary | ICD-10-CM | POA: Diagnosis not present

## 2022-01-16 DIAGNOSIS — R933 Abnormal findings on diagnostic imaging of other parts of digestive tract: Secondary | ICD-10-CM | POA: Diagnosis not present

## 2022-01-16 DIAGNOSIS — R7889 Finding of other specified substances, not normally found in blood: Secondary | ICD-10-CM | POA: Diagnosis not present

## 2022-01-16 DIAGNOSIS — Z87442 Personal history of urinary calculi: Secondary | ICD-10-CM

## 2022-01-16 DIAGNOSIS — J811 Chronic pulmonary edema: Secondary | ICD-10-CM | POA: Diagnosis not present

## 2022-01-16 DIAGNOSIS — Z791 Long term (current) use of non-steroidal anti-inflammatories (NSAID): Secondary | ICD-10-CM

## 2022-01-16 DIAGNOSIS — J441 Chronic obstructive pulmonary disease with (acute) exacerbation: Secondary | ICD-10-CM | POA: Diagnosis not present

## 2022-01-16 DIAGNOSIS — I130 Hypertensive heart and chronic kidney disease with heart failure and stage 1 through stage 4 chronic kidney disease, or unspecified chronic kidney disease: Secondary | ICD-10-CM | POA: Diagnosis not present

## 2022-01-16 DIAGNOSIS — R0602 Shortness of breath: Secondary | ICD-10-CM | POA: Diagnosis not present

## 2022-01-16 LAB — COMPREHENSIVE METABOLIC PANEL
ALT: 38 U/L (ref 0–44)
AST: 43 U/L — ABNORMAL HIGH (ref 15–41)
Albumin: 3.1 g/dL — ABNORMAL LOW (ref 3.5–5.0)
Alkaline Phosphatase: 88 U/L (ref 38–126)
Anion gap: 12 (ref 5–15)
BUN: 20 mg/dL (ref 8–23)
CO2: 23 mmol/L (ref 22–32)
Calcium: 10 mg/dL (ref 8.9–10.3)
Chloride: 105 mmol/L (ref 98–111)
Creatinine, Ser: 1.4 mg/dL — ABNORMAL HIGH (ref 0.61–1.24)
GFR, Estimated: 50 mL/min — ABNORMAL LOW (ref >60)
Glucose, Bld: 126 mg/dL — ABNORMAL HIGH (ref 70–99)
Potassium: 3.7 mmol/L (ref 3.5–5.1)
Sodium: 140 mmol/L (ref 135–145)
Total Bilirubin: 0.9 mg/dL (ref 0.3–1.2)
Total Protein: 7.3 g/dL (ref 6.5–8.1)

## 2022-01-16 LAB — CBC WITH DIFFERENTIAL/PLATELET
Abs Immature Granulocytes: 0.2 10*3/uL — ABNORMAL HIGH (ref 0.00–0.07)
Basophils Absolute: 0.1 10*3/uL (ref 0.0–0.1)
Basophils Relative: 1 %
Eosinophils Absolute: 0.1 10*3/uL (ref 0.0–0.5)
Eosinophils Relative: 0 %
HCT: 42.6 % (ref 39.0–52.0)
Hemoglobin: 14 g/dL (ref 13.0–17.0)
Immature Granulocytes: 1 %
Lymphocytes Relative: 13 %
Lymphs Abs: 2.3 10*3/uL (ref 0.7–4.0)
MCH: 30.4 pg (ref 26.0–34.0)
MCHC: 32.9 g/dL (ref 30.0–36.0)
MCV: 92.6 fL (ref 80.0–100.0)
Monocytes Absolute: 0.9 10*3/uL (ref 0.1–1.0)
Monocytes Relative: 5 %
Neutro Abs: 13.6 10*3/uL — ABNORMAL HIGH (ref 1.7–7.7)
Neutrophils Relative %: 80 %
Platelets: 227 10*3/uL (ref 150–400)
RBC: 4.6 MIL/uL (ref 4.22–5.81)
RDW: 14.2 % (ref 11.5–15.5)
WBC: 17.1 10*3/uL — ABNORMAL HIGH (ref 4.0–10.5)
nRBC: 0 % (ref 0.0–0.2)

## 2022-01-16 LAB — I-STAT VENOUS BLOOD GAS, ED
Acid-Base Excess: 0 mmol/L (ref 0.0–2.0)
Bicarbonate: 27.2 mmol/L (ref 20.0–28.0)
Calcium, Ion: 1.27 mmol/L (ref 1.15–1.40)
HCT: 44 % (ref 39.0–52.0)
Hemoglobin: 15 g/dL (ref 13.0–17.0)
O2 Saturation: 97 %
Potassium: 3.6 mmol/L (ref 3.5–5.1)
Sodium: 141 mmol/L (ref 135–145)
TCO2: 29 mmol/L (ref 22–32)
pCO2, Ven: 51.8 mmHg (ref 44–60)
pH, Ven: 7.328 (ref 7.25–7.43)
pO2, Ven: 105 mmHg — ABNORMAL HIGH (ref 32–45)

## 2022-01-16 LAB — LACTIC ACID, PLASMA: Lactic Acid, Venous: 2.7 mmol/L (ref 0.5–1.9)

## 2022-01-16 LAB — BRAIN NATRIURETIC PEPTIDE: B Natriuretic Peptide: 192.4 pg/mL — ABNORMAL HIGH (ref 0.0–100.0)

## 2022-01-16 LAB — RESP PANEL BY RT-PCR (RSV, FLU A&B, COVID)  RVPGX2
Influenza A by PCR: NEGATIVE
Influenza B by PCR: NEGATIVE
Resp Syncytial Virus by PCR: NEGATIVE
SARS Coronavirus 2 by RT PCR: NEGATIVE

## 2022-01-16 LAB — TROPONIN I (HIGH SENSITIVITY)
Troponin I (High Sensitivity): 402 ng/L (ref <18)
Troponin I (High Sensitivity): 540 ng/L (ref <18)

## 2022-01-16 MED ORDER — SODIUM CHLORIDE 0.9 % IV SOLN: 2.0000 g | Freq: Two times a day (BID) | INTRAVENOUS | Status: DC

## 2022-01-16 MED ORDER — SODIUM CHLORIDE 0.9 % IV SOLN
500.0000 mg | Freq: Once | INTRAVENOUS | Status: AC
  Administered 2022-01-16: 500 mg via INTRAVENOUS
  Filled 2022-01-16: qty 5

## 2022-01-16 MED ORDER — SODIUM CHLORIDE 0.9 % IV SOLN
2.0000 g | Freq: Once | INTRAVENOUS | Status: AC
  Administered 2022-01-16: 2 g via INTRAVENOUS
  Filled 2022-01-16: qty 20

## 2022-01-16 MED ORDER — ACETAMINOPHEN 500 MG PO TABS
1000.0000 mg | ORAL_TABLET | Freq: Once | ORAL | Status: DC
  Filled 2022-01-16: qty 2

## 2022-01-16 MED ORDER — NITROGLYCERIN IN D5W 200-5 MCG/ML-% IV SOLN
0.0000 ug/min | INTRAVENOUS | Status: DC
  Administered 2022-01-16: 5 ug/min via INTRAVENOUS
  Filled 2022-01-16: qty 250

## 2022-01-16 MED ORDER — IOHEXOL 350 MG/ML SOLN
100.0000 mL | Freq: Once | INTRAVENOUS | Status: AC | PRN
  Administered 2022-01-16: 100 mL via INTRAVENOUS

## 2022-01-16 MED ORDER — FUROSEMIDE 10 MG/ML IJ SOLN
60.0000 mg | Freq: Once | INTRAMUSCULAR | Status: AC
  Administered 2022-01-16: 60 mg via INTRAVENOUS
  Filled 2022-01-16: qty 6

## 2022-01-16 MED ORDER — VANCOMYCIN HCL 1500 MG/300ML IV SOLN: 1500.0000 mg | INTRAVENOUS | Status: DC

## 2022-01-16 MED ORDER — VANCOMYCIN HCL 10 G IV SOLR
2500.0000 mg | Freq: Once | INTRAVENOUS | Status: AC
  Administered 2022-01-17: 2500 mg via INTRAVENOUS
  Filled 2022-01-16: qty 2500

## 2022-01-16 MED ORDER — IPRATROPIUM-ALBUTEROL 0.5-2.5 (3) MG/3ML IN SOLN
3.0000 mL | Freq: Once | RESPIRATORY_TRACT | Status: AC
  Administered 2022-01-16: 3 mL via RESPIRATORY_TRACT
  Filled 2022-01-16: qty 3

## 2022-01-16 NOTE — ED Provider Triage Note (Signed)
Emergency Medicine Provider Triage Evaluation Note  [name] , a 81 y.o. male  was evaluated in triage.  Pt complains of shortness of breath and productive cough.  Been going on for the last few days.  Endorses lower extremity swelling.  Denies chest pain but does feel worsening shortness of breath with any exertion.  He is on 4 L supplemental oxygen at home baseline throughout the day and at night when he needs to sleep..  Review of Systems  Per HPI  Physical Exam  BP (!) 184/58   Pulse 98   Temp 99.4 F (37.4 C) (Oral)   Resp (!) 22   SpO2 97%  Gen:   Awake, no distress   Resp:  No tachypneic, wheezing MSK:   Moves extremities without difficulty  Other:    Medical Decision Making  Medically screening exam initiated at 5:59 PM.  Appropriate orders placed.  [name] was informed that the remainder of the evaluation will be completed by another provider, this initial triage assessment does not replace that evaluation, and the importance of remaining in the ED until their evaluation is complete.     Sherrill Raring, PA-C 01/16/22 1759

## 2022-01-16 NOTE — Progress Notes (Signed)
Pharmacy Antibiotic Note  [name] is a 81 y.o. male admitted on 01/16/2022 with sepsis secondary to pneumonia.  Pharmacy has been consulted for Cefepime and Vancomycin dosing to broaden coverage.  WBC 17.1, Tmax 99.4, LA 2.7 SCr 1.40 (b/l ~1.0-1.1)  Pt received azithromycin 573m IV x 1 at 20:49 on 01/16/22 and Ceftriaxone 2g IV x1 at 20:42 on 01/16/22 in the ED.   Plan: Start Cefepime 2g IV q12h Initiate loading dose of Vancomycin 25067mIV x 1, followed by  Vancomycin 150091mV q24h (eAUC ~501)    > Goal AUC 400-550    > Check vancomycin levels at steady state  Check MRSA PCR Monitor daily CBC, temp, SCr, and for clinical signs of improvement  F/u cultures, MRSA PCR, and de-escalate antibiotics as able   Height: 6' 1"  (185.4 cm) Weight: (!) 142.9 kg (315 lb) IBW/kg (Calculated) : 79.9  Temp (24hrs), Avg:99.4 F (37.4 C), Min:99.4 F (37.4 C), Max:99.4 F (37.4 C)  Recent Labs  Lab 01/16/22 1831 01/16/22 1933 01/16/22 2052  WBC  --   --  17.1*  CREATININE 1.40*  --   --   LATICACIDVEN  --  2.7*  --     Estimated Creatinine Clearance: 59.4 mL/min (A) (by C-G formula based on SCr of 1.4 mg/dL (H)).    No Known Allergies  Antimicrobials this admission: Ceftriaxone x1 12/12 Azithromycin x1 12/12 Cefepime 12/12 >>  Vancomycin 12/13 >>   Dose adjustments this admission: [name]  Microbiology results: 12/12 BCx: pending 12/12 MRSA PCR: ordered  Thank you for allowing pharmacy to be a part of this patient's care.  CarLuisa HartharmD, BCPS Clinical Pharmacist 01/16/2022 11:34 PM   [facility]

## 2022-01-16 NOTE — ED Provider Notes (Signed)
[facility] EMERGENCY DEPARTMENT Provider Note   CSN: [id number] Arrival date & time: 01/16/22  1742     History  Chief Complaint  Patient presents with   Shortness of Breath    [name] is a 81 y.o. male.  With PMH of CAD, COPD, CHF presenting with increased shortness of breath and cough.  He was satting 85% on room air and put on nonrebreather by EMS.  Patient is having difficulty answering all questions due to shortness of breath.  He is complaining of shortness of breath but denies any chest pain.  He is complaining of some intermittent upper abdominal pain and according to daughter at bedside he has had intermittent nonbloody nonbilious emesis this week.  He developed fever yesterday.  He has not been on any antibiotics or steroids recently.  He is normally on 4 L nasal cannula during the day and BiPAP at night.  According to daughter they are unsure what his recent weight is but he has been continuously gaining weight despite slightly eating more but he is still taking Lasix every day without improvement.  Has had increased swelling of his lower extremities.  Also endorsing associated orthopnea and dyspnea on exertion.  He has been unable to move or get around due to increasing dyspnea.  According to daughter he has been having intermittent increasing confusion as well within this past week.   Shortness of Breath      Home Medications Prior to Admission medications   Medication Sig Start Date End Date Taking? Authorizing Provider  acetaminophen (TYLENOL) 500 MG tablet Take 2 tablets (1,000 mg total) by mouth every 6 (six) hours as needed for headache (pain). 11/24/20  Yes Luking, Elayne Snare, MD  albuterol (VENTOLIN HFA) 108 (90 Base) MCG/ACT inhaler Inhale 2 puffs into the lungs every 6 (six) hours as needed for wheezing or shortness of breath. 02/20/21  Yes [name], MD  aspirin EC 81 MG tablet Take 81 mg by mouth daily. Swallow whole.   Yes [provider]  celecoxib (CELEBREX) 100 MG capsule Take 100 mg by mouth every morning. 04/05/20  Yes [provider]  DULoxetine (CYMBALTA) 60 MG capsule TAKE 1 CAPSULE BY MOUTH DAILY Patient taking differently: Take 60 mg by mouth daily. 01/04/22  Yes Luking, Elayne Snare, MD  furosemide (LASIX) 40 MG tablet ONE TO ONE AND A HALF TABLET EACH MORNING AS NEEDED Patient taking differently: Take 40 mg by mouth every morning. 06/23/20  Yes Luking, Elayne Snare, MD  ketoconazole (NIZORAL) 2 % cream APPLY TOPICALLY TWICE DAILY. [place] WITH BARRIER CREAM SUCH AS [place] Patient taking differently: Apply 1 Application topically daily as needed for irritation. MIX WITH BARRIER CREAM SUCH AS [place]ITIN 07/11/21  Yes [name], MD  metoprolol tartrate (LOPRESSOR) 50 MG tablet TAKE 1/2 TABLET(25 MG) BY MOUTH EVERY EVENING Patient taking differently: Take 25 mg by mouth in the morning. 09/11/21  Yes [name], MD  mupirocin ointment (BACTROBAN) 2 % Apply 1 Application topically 2 (two) times daily as needed (wound care/raw areas). 09/20/21  Yes Luking, Elayne Snare, MD  nitroGLYCERIN (NITROSTAT) 0.4 MG SL tablet ONE TABLET UNDER TONGUE AS NEEDED FOR CHEST PAIN EVERY 5 MINUTES AS DIRECTED Patient taking differently: Place 0.4 mg under the tongue every 5 (five) minutes as needed for chest pain. 10/14/20  Yes Lorretta Harp, MD  nystatin cream (MYCOSTATIN) Apply 1 application topically 2 (two) times daily as needed (rash/itching). 12/28/20  Yes  [name], MD  pravastatin (PRAVACHOL) 40 MG tablet TAKE 1 TABLET(40 MG) BY MOUTH DAILY Patient taking differently: Take 40 mg by mouth daily. 12/06/21  Yes [name], MD  pyridOXINE (VITAMIN B-6) 100 MG tablet Take 100 mg by mouth daily.   Yes [provider]  triamcinolone cream (KENALOG) 0.1 % Apply 1 Application topically 2 (two) times daily. Patient taking differently: Apply 1 Application topically daily as needed (for itching). 09/20/21  Yes [name], MD  vitamin C (ASCORBIC ACID) 500 MG tablet Take 500 mg by mouth daily.   Yes [provider]  Vitamin D, Ergocalciferol, (DRISDOL) 1.25 MG (50000 UNIT) CAPS capsule TAKE 1 CAPSULE BY MOUTH EVERY 7 DAYS Patient taking differently: Take 50,000 Units by mouth every 7 (seven) days. Monday 01/02/21  Yes [name], MD      Allergies    Patient has no known allergies.    Review of Systems   Review of Systems  Respiratory:  Positive for shortness of breath.     Physical Exam Updated Vital Signs BP (!) 182/82   Pulse (!) 113   Temp 99.4 F (37.4 C) (Oral)   Resp (!) 29   Ht 6' 1"  (1.854 m)   Wt (!) 142.9 kg   SpO2 96%   BMI 41.56 kg/m  Physical Exam Constitutional: Alert and oriented.  Ill-appearing, speaking in short sentences, nonrebreather present Eyes: Conjunctivae are normal. ENT      Head: Normocephalic and atraumatic.      Neck: No stridor. Cardiovascular: S1, S2, tachycardic, warm and dry Respiratory: Tachypneic, diffuse Rales, intercostal retractions, no wheezing appreciated, satting 92% on NRB Gastrointestinal: Soft and mildly distended with upper epigastrium and right upper quadrant tenderness, no rebound or guarding Musculoskeletal: Normal range of motion in all extremities. 2+ equal nontender nonerythematous pitting edema extending to knees bilaterally Neurologic: Normal speech and language.  No facial droop.  Intermittently moving all 4 extremities.   Skin: Skin is warm, dry Psychiatric: Mood and affect are normal. Speech and behavior are normal.  ED Results / Procedures / Treatments   Labs (all labs ordered are listed, but only abnormal results are displayed) Labs Reviewed  CBC WITH DIFFERENTIAL/PLATELET - Abnormal; Notable for the following components:      Result Value   WBC 17.1 (*)    Neutro Abs 13.6 (*)    Abs Immature Granulocytes 0.20 (*)    All other components within normal limits  BRAIN NATRIURETIC PEPTIDE - Abnormal; Notable for  the following components:   B Natriuretic Peptide 192.4 (*)    All other components within normal limits  COMPREHENSIVE METABOLIC PANEL - Abnormal; Notable for the following components:   Glucose, Bld 126 (*)    Creatinine, Ser 1.40 (*)    Albumin 3.1 (*)    AST 43 (*)    GFR, Estimated 50 (*)    All other components within normal limits  LACTIC ACID, PLASMA - Abnormal; Notable for the following components:   Lactic Acid, Venous 2.7 (*)    All other components within normal limits  I-STAT VENOUS BLOOD GAS, ED - Abnormal; Notable for the following components:   pO2, Ven 105 (*)    All other components within normal limits  TROPONIN I (HIGH SENSITIVITY) - Abnormal; Notable for the following components:   Troponin I (High Sensitivity) 402 (*)    All other components within normal limits  TROPONIN I (HIGH SENSITIVITY) - Abnormal; Notable for the following  components:   Troponin I (High Sensitivity) 540 (*)    All other components within normal limits  RESP PANEL BY RT-PCR (RSV, FLU A&B, COVID)  RVPGX2  CULTURE, BLOOD (ROUTINE X 2)  CULTURE, BLOOD (ROUTINE X 2) W REFLEX TO ID PANEL  MRSA NEXT GEN BY PCR, NASAL  LACTIC ACID, PLASMA  LACTIC ACID, PLASMA  LACTIC ACID, PLASMA  CBC  BASIC METABOLIC PANEL    EKG EKG Interpretation  Date/Time:  Tuesday January 16 2022 17:47:21 EST Ventricular Rate:  86 PR Interval:  232 QRS Duration: 106 QT Interval:  372 QTC Calculation: 445 R Axis:   -12 Text Interpretation: Sinus rhythm with 1st degree A-V block Left ventricular hypertrophy with repolarization abnormality ( Cornell product ) Abnormal ECG When compared with ECG of 28-Oct-2020 21:11, PREVIOUS ECG IS PRESENT Increased ST depression lateral leads V5 V6 T wave inversion I avL present on prior Confirmed by Georgina Snell [phone]) on 01/16/2022 6:06:33 PM  Radiology CT Angio Chest/Abd/Pel for Dissection W and/or Wo Contrast  Result Date: 01/16/2022 CLINICAL DATA:  history of AAA,  in HF, complaining of abdominal pain, vomiting, SOB. Ulcerative colitis. EXAM: CT ANGIOGRAPHY CHEST, ABDOMEN AND PELVIS TECHNIQUE: Non-contrast CT of the chest was initially obtained. Multidetector CT imaging through the chest, abdomen and pelvis was performed using the standard protocol during bolus administration of intravenous contrast. Multiplanar reconstructed images and MIPs were obtained and reviewed to evaluate the vascular anatomy. RADIATION DOSE REDUCTION: This exam was performed according to the departmental dose-optimization program which includes automated exposure control, adjustment of the mA and/or kV according to patient size and/or use of iterative reconstruction technique. CONTRAST:  188m OMNIPAQUE IOHEXOL 350 MG/ML SOLN COMPARISON:  None Available. FINDINGS: CTA CHEST FINDINGS Cardiovascular: Preferential opacification of the thoracic aorta. No evidence of thoracic aortic aneurysm or dissection. normal heart size. No significant pericardial effusion. Moderate to severe atherosclerotic plaque of the thoracic aorta. Four-vessel coronary artery calcifications. No central pulmonary embolus. Main pulmonary artery is mildly enlarged measuring up to 3.2 cm. Unable to evaluate more distally due to timing of contrast. Mediastinum/Nodes: No enlarged mediastinal, hilar, or axillary lymph nodes. Thyroid gland, trachea, and esophagus demonstrate no significant findings. Lungs/Pleura: Severe centrilobular and at least moderate paraseptal emphysematous changes. Heterogeneous bilateral lower lobe likely airspace opacities. Right middle lobe peribronchovascular patchy airspace opacities. No pulmonary nodule. No pulmonary mass. No pleural effusion. No pneumothorax. Musculoskeletal: No chest wall abnormality. No suspicious lytic or blastic osseous lesions. No acute displaced fracture. Continuous bridging osteophytes and spinous process ligament calcification. At least moderate degenerative changes of the bilateral  shoulders. Review of the MIP images confirms the above findings. CTA ABDOMEN AND PELVIS FINDINGS VASCULAR Aorta: Severe normal caliber aorta without aneurysm, dissection, vasculitis or significant stenosis. Celiac: Moderate atherosclerotic plaque with moderate narrowing of the origin. Patent without evidence of aneurysm, dissection, vasculitis or significant stenosis. SMA: Moderate atherosclerotic plaque with moderate narrowing of the origin. Patent without evidence of aneurysm, dissection, vasculitis or significant stenosis. Renals: Both renal arteries are patent without evidence of aneurysm, dissection, vasculitis, fibromuscular dysplasia or significant stenosis. IMA: Patent without evidence of aneurysm, dissection, vasculitis or significant stenosis. Inflow: Mild-to-moderate patent without evidence of aneurysm, dissection, vasculitis or significant stenosis. Veins: No obvious venous abnormality within the limitations of this arterial phase study. Review of the MIP images confirms the above findings. NON-VASCULAR Hepatobiliary: No focal liver abnormality. Layering calcified stones noted within the gallbladder lumen. No gallbladder wall thickening or pericholecystic fluid. No biliary dilatation. Pancreas: No  focal lesion. Normal pancreatic contour. No surrounding inflammatory changes. No main pancreatic ductal dilatation. Spleen: Normal in size without focal abnormality. Adrenals/Urinary Tract: No adrenal nodule bilaterally. Bilateral kidneys enhance symmetrically. Punctate to 2 mm calcifications within the kidneys. No ureterolithiasis. No hydronephrosis. No hydroureter. The urinary bladder is unremarkable. Stomach/Bowel: Stomach is within normal limits. No evidence of bowel wall thickening or dilatation. Loss of haustral markings along the rectosigmoid colon suggestive of ulcerative colitis. Colonic diverticulosis. Appendix appears normal. Lymphatic: No lymphadenopathy. Reproductive: Prostate is unremarkable.  Other: No intraperitoneal free fluid. No intraperitoneal free gas. No organized fluid collection. Musculoskeletal: Tiny fat containing umbilical hernia. Right hip subcutaneus soft tissue edema possible hematoma formation (7:310). No suspicious lytic or blastic osseous lesions. No acute displaced fracture. Continuous bridging osteophyte formation. Partial ankylosis of the sacroiliac joints. Review of the MIP images confirms the above findings. IMPRESSION: 1. No acute thoracic or abdominal aorta abnormality. 2. 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 3. No central pulmonary embolus. Unable to evaluate more distally due to timing of contrast. 4. Bilateral lower lobe airspace opacities as well as right middle lobe peribronchovascular patchy airspace opacities consistent with pneumonia. 5. Cholelithiasis with no CT finding of acute cholecystitis. 6. Nonobstructive bilateral nephrolithiasis. 7. Colonic diverticulosis with no acute diverticulitis. 8. Ankylosing spondylitis in a patient with ulcers colitis. 9. Right hip subcutaneus soft tissue edema possible hematoma formation. No underlying acute fracture. Electronically Signed   By: Iven Finn M.D.   On: 01/16/2022 22:48   DG Chest 1 View  Result Date: 01/16/2022 CLINICAL DATA:  141880 SOB (shortness of breath) 141880 EXAM: CHEST  1 VIEW COMPARISON:  Chest x-ray 08/21/2021 FINDINGS: Enlarged cardiac silhouette. The heart and mediastinal contours are unchanged given change in technique. Aortic calcification. Low lung volumes. Bibasilar airspace opacities. Increased interstitial markings. Bilateral trace to small volume, left greater than right, pleural effusions. No pneumothorax. No acute osseous abnormality. IMPRESSION: 1. Cardiomegaly with pulmonary edema. 2. Bilateral trace to small volume, left greater than right, pleural effusions. 3. Bibasilar airspace opacities may represent  combination of atelectasis versus infection/inflammation. 4.  Aortic Atherosclerosis (ICD10-I70.0). Electronically Signed   By: Iven Finn M.D.   On: 01/16/2022 19:25    Procedures .Critical Care  Performed by: Elgie Congo, MD Authorized by: Elgie Congo, MD   Critical care provider statement:    Critical care time (minutes):  65   Critical care was necessary to treat or prevent imminent or life-threatening deterioration of the following conditions:  Cardiac failure, respiratory failure and sepsis   Critical care was time spent personally by me on the following activities:  Development of treatment plan with patient or surrogate, discussions with consultants, evaluation of patient's response to treatment, examination of patient, ordering and review of laboratory studies, ordering and review of radiographic studies, ordering and performing treatments and interventions, pulse oximetry, re-evaluation of patient's condition, review of old charts and obtaining history from patient or surrogate   Care discussed with: admitting provider      Medications Ordered in ED Medications  acetaminophen (TYLENOL) tablet 1,000 mg (1,000 mg Oral Not Given 01/16/22 2039)  nitroGLYCERIN 50 mg in dextrose 5 % 250 mL (0.2 mg/mL) infusion (20 mcg/min Intravenous Rate/Dose Change 01/16/22 2325)  ceFEPIme (MAXIPIME) 2 g in sodium chloride 0.9 % 100 mL IVPB (has no administration in time range)  vancomycin (VANCOCIN) 2,500 mg in sodium chloride 0.9 % 500 mL IVPB (has no administration  in time range)  vancomycin (VANCOREADY) IVPB 1500 mg/300 mL (has no administration in time range)  furosemide (LASIX) injection 60 mg (60 mg Intravenous Given 01/16/22 2037)  cefTRIAXone (ROCEPHIN) 2 g in sodium chloride 0.9 % 100 mL IVPB (0 g Intravenous Stopped 01/16/22 2258)  azithromycin (ZITHROMAX) 500 mg in sodium chloride 0.9 % 250 mL IVPB (500 mg Intravenous New Bag/Given 01/16/22 2049)  iohexol (OMNIPAQUE) 350  MG/ML injection 100 mL (100 mLs Intravenous Contrast Given 01/16/22 2227)  ipratropium-albuterol (DUONEB) 0.5-2.5 (3) MG/3ML nebulizer solution 3 mL (3 mLs Nebulization Given 01/16/22 2309)    ED Course/ Medical Decision Making/ A&P Clinical Course as of 01/17/22 0000  Tue Jan 16, 2022  2113 S/w on call cardiologist who agrees with current management of CHF. Does not think he needs heparin or NSTEMI treatment. Will put in consult note and advises medicine admission. [VB]  2358 Spoke with on-call hospitalist Dr. Alcario Drought who evaluated patient was concern for increased work of breathing and inability to keep up staying on BiPAP for prolonged period of time.  Was involved in prolonged goals of care discussion with patient patient's daughter at bedside.  Currently wanting to be full code.  ICU also involved.  Patient either to be admitted to ICU on airway watch versus hospitalist/stepdown. [VB]    Clinical Course User Index [VB] Elgie Congo, MD                           Medical Decision Making Prentis Langdon Mounce is a 81 y.o. male.  With PMH of CAD, COPD, CHF presenting with increased shortness of breath and cough.  He was satting 85% on room air and put on nonrebreather by EMS.    Patient presents ill-appearing with increased work of breathing hypertensive 184/58 and tachycardic with tachypnea in the 30s and diffuse Rales satting 92% on nonrebreather.  His exam and symptoms are concerning for fluid overload and CHF exacerbation.  Also considered possible superimposed pneumonia with his fever at home and vomiting and diarrhea for possible superimposed viral pneumonia.  No wheezing appreciated and symptoms not necessarily suggestive of COPD exacerbation.  EKG obtained with increased ST depression in lateral leads V5 and V6 and known T wave inversions 1 and aVL.  However patient denying any chest pain.  Suspect demand in the setting of fluid overload and hypoxia.  Chest x-ray obtained which I  personally reviewed showed cardiomegaly, pulmonary edema with bilateral pleural effusions and bilateral airspace opacities concerning for superimposed pneumonia.  VBG with no hypercarbia pH 7.32, pCO2 51.8.  Labs resulted concerning for infection with leukocytosis 17.1 with left shift and elevated lactate 2.7.  COVID RSV and flu all negative.  Troponins were elevated initial 402, repeat 540, discussed with on-call cardiologist Dr. Chancy Milroy who said he will put in formal consult note but does think related to demand ischemia and CHF exacerbation from fluid overload and multifocal pneumonia.  He does not recommend starting heparin at this time for NSTEMI. Recommends medicine admission.  Patient started on BiPAP, nitroglycerin infusion, Rocephin and azithromycin for superimposed pneumonia and started diuresis with Lasix 60 mg.  Broaden out to vancomycin and cefepime after results of CTA dissection scan which showed diffuse pneumonia bilaterally worse on the right.  Hospitalist was concern for increased work of breathing and ICU became involved.  Patient wanting to be full code at this time.  Do not think currently warranting intubation as he is awake, mentating appropriately,  but may require airway watch.  Amount and/or Complexity of Data Reviewed Labs: ordered. Radiology: ordered.  Risk OTC drugs. Prescription drug management. Decision regarding hospitalization.    Final Clinical Impression(s) / ED Diagnoses Final diagnoses:  Acute on chronic congestive heart failure, unspecified heart failure type ([place])  Acute pulmonary edema ([place])  Community acquired pneumonia, unspecified laterality  Nephrolithiasis  Calculus of gallbladder without cholecystitis without obstruction    Rx / DC Orders ED Discharge Orders     None         Elgie Congo, MD 01/17/22 0000

## 2022-01-16 NOTE — ED Triage Notes (Signed)
Pt arrives via EMS from home with a cough for 3 days and SOB. Pt reports throwing up while laying down on Sunday. O2 85% RA, pt arrives on NRB 94-97%. Hx of CHF

## 2022-01-17 ENCOUNTER — Inpatient Hospital Stay (HOSPITAL_COMMUNITY): Payer: Medicare Other

## 2022-01-17 ENCOUNTER — Telehealth: Payer: Self-pay

## 2022-01-17 DIAGNOSIS — J9601 Acute respiratory failure with hypoxia: Secondary | ICD-10-CM | POA: Diagnosis not present

## 2022-01-17 DIAGNOSIS — Z9981 Dependence on supplemental oxygen: Secondary | ICD-10-CM | POA: Diagnosis not present

## 2022-01-17 DIAGNOSIS — K5190 Ulcerative colitis, unspecified, without complications: Secondary | ICD-10-CM | POA: Diagnosis present

## 2022-01-17 DIAGNOSIS — I5033 Acute on chronic diastolic (congestive) heart failure: Secondary | ICD-10-CM

## 2022-01-17 DIAGNOSIS — J9811 Atelectasis: Secondary | ICD-10-CM | POA: Diagnosis present

## 2022-01-17 DIAGNOSIS — I160 Hypertensive urgency: Secondary | ICD-10-CM | POA: Diagnosis not present

## 2022-01-17 DIAGNOSIS — R7889 Finding of other specified substances, not normally found in blood: Secondary | ICD-10-CM

## 2022-01-17 DIAGNOSIS — N1831 Chronic kidney disease, stage 3a: Secondary | ICD-10-CM | POA: Diagnosis present

## 2022-01-17 DIAGNOSIS — E876 Hypokalemia: Secondary | ICD-10-CM | POA: Diagnosis present

## 2022-01-17 DIAGNOSIS — J9690 Respiratory failure, unspecified, unspecified whether with hypoxia or hypercapnia: Secondary | ICD-10-CM | POA: Diagnosis not present

## 2022-01-17 DIAGNOSIS — J810 Acute pulmonary edema: Secondary | ICD-10-CM | POA: Diagnosis not present

## 2022-01-17 DIAGNOSIS — R1319 Other dysphagia: Secondary | ICD-10-CM | POA: Diagnosis not present

## 2022-01-17 DIAGNOSIS — R410 Disorientation, unspecified: Secondary | ICD-10-CM | POA: Diagnosis not present

## 2022-01-17 DIAGNOSIS — K2990 Gastroduodenitis, unspecified, without bleeding: Secondary | ICD-10-CM | POA: Diagnosis not present

## 2022-01-17 DIAGNOSIS — Z1152 Encounter for screening for COVID-19: Secondary | ICD-10-CM | POA: Diagnosis not present

## 2022-01-17 DIAGNOSIS — I509 Heart failure, unspecified: Secondary | ICD-10-CM

## 2022-01-17 DIAGNOSIS — J189 Pneumonia, unspecified organism: Secondary | ICD-10-CM | POA: Diagnosis not present

## 2022-01-17 DIAGNOSIS — N200 Calculus of kidney: Secondary | ICD-10-CM | POA: Diagnosis present

## 2022-01-17 DIAGNOSIS — J439 Emphysema, unspecified: Secondary | ICD-10-CM | POA: Diagnosis not present

## 2022-01-17 DIAGNOSIS — J9621 Acute and chronic respiratory failure with hypoxia: Secondary | ICD-10-CM

## 2022-01-17 DIAGNOSIS — Z6841 Body Mass Index (BMI) 40.0 and over, adult: Secondary | ICD-10-CM | POA: Diagnosis not present

## 2022-01-17 DIAGNOSIS — J9611 Chronic respiratory failure with hypoxia: Secondary | ICD-10-CM | POA: Diagnosis not present

## 2022-01-17 DIAGNOSIS — K224 Dyskinesia of esophagus: Secondary | ICD-10-CM | POA: Diagnosis not present

## 2022-01-17 DIAGNOSIS — Z66 Do not resuscitate: Secondary | ICD-10-CM | POA: Diagnosis not present

## 2022-01-17 DIAGNOSIS — Z87891 Personal history of nicotine dependence: Secondary | ICD-10-CM | POA: Diagnosis not present

## 2022-01-17 DIAGNOSIS — N179 Acute kidney failure, unspecified: Secondary | ICD-10-CM | POA: Diagnosis not present

## 2022-01-17 DIAGNOSIS — R0602 Shortness of breath: Secondary | ICD-10-CM | POA: Diagnosis not present

## 2022-01-17 DIAGNOSIS — K2970 Gastritis, unspecified, without bleeding: Secondary | ICD-10-CM | POA: Diagnosis not present

## 2022-01-17 DIAGNOSIS — R1314 Dysphagia, pharyngoesophageal phase: Secondary | ICD-10-CM | POA: Diagnosis not present

## 2022-01-17 DIAGNOSIS — I169 Hypertensive crisis, unspecified: Secondary | ICD-10-CM | POA: Diagnosis not present

## 2022-01-17 DIAGNOSIS — J441 Chronic obstructive pulmonary disease with (acute) exacerbation: Secondary | ICD-10-CM | POA: Diagnosis not present

## 2022-01-17 DIAGNOSIS — K222 Esophageal obstruction: Secondary | ICD-10-CM | POA: Diagnosis present

## 2022-01-17 DIAGNOSIS — R918 Other nonspecific abnormal finding of lung field: Secondary | ICD-10-CM | POA: Diagnosis not present

## 2022-01-17 DIAGNOSIS — F0393 Unspecified dementia, unspecified severity, with mood disturbance: Secondary | ICD-10-CM | POA: Diagnosis not present

## 2022-01-17 DIAGNOSIS — G4733 Obstructive sleep apnea (adult) (pediatric): Secondary | ICD-10-CM | POA: Diagnosis not present

## 2022-01-17 DIAGNOSIS — I110 Hypertensive heart disease with heart failure: Secondary | ICD-10-CM | POA: Diagnosis not present

## 2022-01-17 DIAGNOSIS — J440 Chronic obstructive pulmonary disease with acute lower respiratory infection: Secondary | ICD-10-CM | POA: Diagnosis not present

## 2022-01-17 DIAGNOSIS — K449 Diaphragmatic hernia without obstruction or gangrene: Secondary | ICD-10-CM | POA: Diagnosis not present

## 2022-01-17 DIAGNOSIS — R933 Abnormal findings on diagnostic imaging of other parts of digestive tract: Secondary | ICD-10-CM | POA: Diagnosis not present

## 2022-01-17 DIAGNOSIS — J449 Chronic obstructive pulmonary disease, unspecified: Secondary | ICD-10-CM | POA: Diagnosis not present

## 2022-01-17 DIAGNOSIS — R0902 Hypoxemia: Secondary | ICD-10-CM | POA: Diagnosis not present

## 2022-01-17 DIAGNOSIS — Z79899 Other long term (current) drug therapy: Secondary | ICD-10-CM | POA: Diagnosis not present

## 2022-01-17 DIAGNOSIS — I517 Cardiomegaly: Secondary | ICD-10-CM | POA: Diagnosis not present

## 2022-01-17 DIAGNOSIS — E785 Hyperlipidemia, unspecified: Secondary | ICD-10-CM | POA: Diagnosis present

## 2022-01-17 DIAGNOSIS — R338 Other retention of urine: Secondary | ICD-10-CM | POA: Diagnosis present

## 2022-01-17 DIAGNOSIS — I10 Essential (primary) hypertension: Secondary | ICD-10-CM | POA: Diagnosis not present

## 2022-01-17 DIAGNOSIS — R1310 Dysphagia, unspecified: Secondary | ICD-10-CM | POA: Diagnosis not present

## 2022-01-17 DIAGNOSIS — Z9861 Coronary angioplasty status: Secondary | ICD-10-CM | POA: Diagnosis not present

## 2022-01-17 DIAGNOSIS — I130 Hypertensive heart and chronic kidney disease with heart failure and stage 1 through stage 4 chronic kidney disease, or unspecified chronic kidney disease: Secondary | ICD-10-CM | POA: Diagnosis not present

## 2022-01-17 DIAGNOSIS — J13 Pneumonia due to Streptococcus pneumoniae: Secondary | ICD-10-CM | POA: Diagnosis not present

## 2022-01-17 DIAGNOSIS — I2510 Atherosclerotic heart disease of native coronary artery without angina pectoris: Secondary | ICD-10-CM | POA: Diagnosis present

## 2022-01-17 DIAGNOSIS — R935 Abnormal findings on diagnostic imaging of other abdominal regions, including retroperitoneum: Secondary | ICD-10-CM | POA: Diagnosis not present

## 2022-01-17 LAB — ECHOCARDIOGRAM COMPLETE
AR max vel: 2.79 cm2
AV Area VTI: 2.66 cm2
AV Area mean vel: 2.63 cm2
AV Mean grad: 8 mmHg
AV Peak grad: 13.7 mmHg
Ao pk vel: 1.85 m/s
Area-P 1/2: 3.12 cm2
Height: 73 in
S' Lateral: 4.3 cm
Weight: 5040 oz

## 2022-01-17 LAB — CBC
HCT: 41 % (ref 39.0–52.0)
Hemoglobin: 13.3 g/dL (ref 13.0–17.0)
MCH: 30.2 pg (ref 26.0–34.0)
MCHC: 32.4 g/dL (ref 30.0–36.0)
MCV: 93.2 fL (ref 80.0–100.0)
Platelets: 217 10*3/uL (ref 150–400)
RBC: 4.4 MIL/uL (ref 4.22–5.81)
RDW: 14.2 % (ref 11.5–15.5)
WBC: 18.3 10*3/uL — ABNORMAL HIGH (ref 4.0–10.5)
nRBC: 0 % (ref 0.0–0.2)

## 2022-01-17 LAB — BASIC METABOLIC PANEL
Anion gap: 12 (ref 5–15)
BUN: 19 mg/dL (ref 8–23)
CO2: 23 mmol/L (ref 22–32)
Calcium: 9.3 mg/dL (ref 8.9–10.3)
Chloride: 104 mmol/L (ref 98–111)
Creatinine, Ser: 1.51 mg/dL — ABNORMAL HIGH (ref 0.61–1.24)
GFR, Estimated: 46 mL/min — ABNORMAL LOW (ref >60)
Glucose, Bld: 163 mg/dL — ABNORMAL HIGH (ref 70–99)
Potassium: 3.4 mmol/L — ABNORMAL LOW (ref 3.5–5.1)
Sodium: 139 mmol/L (ref 135–145)

## 2022-01-17 LAB — MRSA NEXT GEN BY PCR, NASAL: MRSA by PCR Next Gen: NOT DETECTED

## 2022-01-17 LAB — TROPONIN I (HIGH SENSITIVITY): Troponin I (High Sensitivity): 451 ng/L (ref <18)

## 2022-01-17 LAB — STREP PNEUMONIAE URINARY ANTIGEN: Strep Pneumo Urinary Antigen: POSITIVE — AB

## 2022-01-17 LAB — PHOSPHORUS: Phosphorus: 2.4 mg/dL — ABNORMAL LOW (ref 2.5–4.6)

## 2022-01-17 LAB — LACTIC ACID, PLASMA
Lactic Acid, Venous: 1.3 mmol/L (ref 0.5–1.9)
Lactic Acid, Venous: 1.8 mmol/L (ref 0.5–1.9)

## 2022-01-17 LAB — MAGNESIUM: Magnesium: 1.5 mg/dL — ABNORMAL LOW (ref 1.7–2.4)

## 2022-01-17 LAB — BRAIN NATRIURETIC PEPTIDE: B Natriuretic Peptide: 159.1 pg/mL — ABNORMAL HIGH (ref 0.0–100.0)

## 2022-01-17 MED ORDER — SODIUM CHLORIDE 0.9 % IV SOLN
500.0000 mg | INTRAVENOUS | Status: DC
  Administered 2022-01-17: 500 mg via INTRAVENOUS
  Filled 2022-01-17: qty 5

## 2022-01-17 MED ORDER — HEPARIN SODIUM (PORCINE) 5000 UNIT/ML IJ SOLN
5000.0000 [IU] | Freq: Three times a day (TID) | INTRAMUSCULAR | Status: DC
  Administered 2022-01-17 – 2022-01-26 (×27): 5000 [IU] via SUBCUTANEOUS
  Filled 2022-01-17 (×26): qty 1

## 2022-01-17 MED ORDER — MAGNESIUM SULFATE 4 GM/100ML IV SOLN
4.0000 g | Freq: Once | INTRAVENOUS | Status: AC
  Administered 2022-01-17: 4 g via INTRAVENOUS
  Filled 2022-01-17: qty 100

## 2022-01-17 MED ORDER — HYDRALAZINE HCL 20 MG/ML IJ SOLN: 10.0000 mg | INTRAMUSCULAR | Status: DC | PRN

## 2022-01-17 MED ORDER — ACETAMINOPHEN 650 MG RE SUPP: 325.0000 mg | Freq: Four times a day (QID) | RECTAL | Status: DC | PRN

## 2022-01-17 MED ORDER — POTASSIUM PHOSPHATES 15 MMOLE/5ML IV SOLN
30.0000 mmol | Freq: Once | INTRAVENOUS | Status: AC
  Administered 2022-01-17: 30 mmol via INTRAVENOUS
  Filled 2022-01-17: qty 10

## 2022-01-17 MED ORDER — ISOSORBIDE MONONITRATE ER 60 MG PO TB24
60.0000 mg | ORAL_TABLET | Freq: Every day | ORAL | Status: DC
  Administered 2022-01-17 – 2022-01-22 (×5): 60 mg via ORAL
  Filled 2022-01-17 (×6): qty 1

## 2022-01-17 MED ORDER — DOCUSATE SODIUM 100 MG PO CAPS: 100.0000 mg | ORAL_CAPSULE | Freq: Two times a day (BID) | ORAL | Status: DC | PRN

## 2022-01-17 MED ORDER — AMLODIPINE BESYLATE 5 MG PO TABS
5.0000 mg | ORAL_TABLET | Freq: Every day | ORAL | Status: DC
  Administered 2022-01-17: 5 mg via ORAL
  Filled 2022-01-17 (×2): qty 1

## 2022-01-17 MED ORDER — CHLORHEXIDINE GLUCONATE CLOTH 2 % EX PADS
6.0000 | MEDICATED_PAD | Freq: Every day | CUTANEOUS | Status: DC
  Administered 2022-01-17 – 2022-01-26 (×9): 6 via TOPICAL

## 2022-01-17 MED ORDER — PERFLUTREN LIPID MICROSPHERE
1.0000 mL | INTRAVENOUS | Status: AC | PRN
  Administered 2022-01-17: 2 mL via INTRAVENOUS

## 2022-01-17 MED ORDER — POLYETHYLENE GLYCOL 3350 17 G PO PACK: 17.0000 g | PACK | Freq: Every day | ORAL | Status: DC | PRN

## 2022-01-17 MED ORDER — SODIUM CHLORIDE 0.9 % IV SOLN
2.0000 g | INTRAVENOUS | Status: AC
  Administered 2022-01-17 – 2022-01-20 (×4): 2 g via INTRAVENOUS
  Filled 2022-01-17 (×4): qty 20

## 2022-01-17 MED ORDER — FUROSEMIDE 10 MG/ML IJ SOLN
40.0000 mg | Freq: Two times a day (BID) | INTRAMUSCULAR | Status: DC
  Administered 2022-01-17 (×2): 40 mg via INTRAVENOUS
  Filled 2022-01-17 (×2): qty 4

## 2022-01-17 MED ORDER — ASPIRIN 81 MG PO CHEW
81.0000 mg | CHEWABLE_TABLET | Freq: Every day | ORAL | Status: DC
  Administered 2022-01-17 – 2022-01-26 (×9): 81 mg via ORAL
  Filled 2022-01-17 (×10): qty 1

## 2022-01-17 NOTE — H&P (Signed)
NAME:  [name], MRN:  [id number], DOB:  07/10/1940, LOS: 0 ADMISSION DATE:  01/16/2022, CONSULTATION DATE:  01/17/22 REFERRING MD:  Nechama Guard CHIEF COMPLAINT:  Dyspnea   History of Present Illness:  [name] is a 81 y.o. male who has a PMH as below including but not limited to COPD on 4L O2, OSA on CPAP, CHF. He presented to [facility] ED 12/12 with dyspnea. He had experienced intermittent non bilious emesis earlier in the week and on 12/11, he developed fever with Tmax of 101.8.  He apparently has had a steady increase in his weight over the past few weeks, unsure exactly how much.  He had had mild increase in lower extremity edema as well.  In ED, he was found to be hypotensive and hypoxic with increased work of breathing.  He was subsequently placed on BiPAP and started on glycerin infusion.  Given 60 mg Lasix. He had chest x-ray that was concerning for pulmonary edema and multifocal pneumonia.  CT of the chest abdomen and pelvis demonstrated bilateral pneumonia, cholelithiasis without findings of acute cholecystitis, nonobstructive bilateral nephrolithiasis, colonic diverticulosis.  COVID, flu, RSV all negative.  Due to his ongoing needs of BiPAP along with hypertensive urgency, PCCM was called for admission.  I discussed with his daughter at baseline who confirmed that patient is full code in the event that he fails BiPAP.  She does express that he does wear BiPAP nocturnally every night and is very compliant with this.  She states that his breathing seems to be at baseline currently.  He is awake and alert and is asking for water to drink.  He has not been hospitalized recently or been on recent antibiotics.  Pertinent  Medical History:  has Diarrhea; Ulcerative colitis, left sided ([place]); Ulcerative colitis ([place]); Obstructive sleep apnea; Gallstones; RUQ pain; BPH (benign prostatic hyperplasia); Prediabetes; Chest pain at rest; CAD S/P percutaneous coronary angioplasty; Hiatal hernia;  GERD (gastroesophageal reflux disease); Essential hypertension; Dyslipidemia, goal LDL below 70; Erectile dysfunction; Hx of skin cancer, basal cell; Fracture, [place], left, closed; Closed fracture of distal radius and ulna, left, with malunion, subsequent encounter; Carpal tunnel syndrome of left wrist; History of carpal tunnel surgery of left wrist with unlar head resection 01/17/17; Morbid obesity ([place]); Exertional dyspnea; Fecal incontinence; Iron deficiency; Chronic diastolic CHF (congestive heart failure) ([place]); Rectal bleeding; Ankylosing spondylitis ([place]); Acute respiratory failure with hypoxia ([place]); Acute on chronic respiratory failure with hypoxia and hypercapnia ([place]); Shortness of breath; COPD (chronic obstructive pulmonary disease) ([place]); Chronic ulcerative proctosigmoiditis, without complications ([place]); Chronic respiratory failure with hypoxia ([place]); Obesity with body mass index (BMI) of 30.0 to 39.9; DNR (do not resuscitate); Cervical spondylosis; Disseminated idiopathic skeletal hyperostosis; Dysphagia; Neck pain; Status post cervical spinal fusion; Syncope; Class 3 obesity ([place]); Head concussion; Bilateral hand fractures; Hematochezia; Depression, major, single episode, moderate ([place]); and CAP (community acquired pneumonia) on their problem list.  Significant Hospital Events: Including procedures, antibiotic start and stop dates in addition to other pertinent events   12/13 admit.  Interim History / Subjective:  Comfortable on BiPAP.  Objective:  Blood pressure (!) 182/82, pulse (!) 113, temperature 99.4 F (37.4 C), temperature source Oral, resp. rate (!) 29, height 6' 1"  (1.854 m), weight (!) 142.9 kg, SpO2 96 %.    FiO2 (%):  [60 %] 60 %   Intake/Output Summary (Last 24 hours) at 01/17/2022 0104 Last data filed at 01/17/2022 0005 Gross per 24 hour  Intake 600 ml  Output --  Net 600 ml   Filed Weights   01/16/22 2309  Weight: (!) 142.9 kg    Examination: General: Adult  male, resting in bed, in NAD. Neuro: A&O x 3, no deficits. HEENT: [place]/AT. Sclerae anicteric. BiPAP in place. Cardiovascular: RRR, no M/R/G.  Lungs: Respirations even and unlabored.  CTA bilaterally, No W/R/R. Abdomen: Obese, BS x 4, soft, NT/ND.  Musculoskeletal: No gross deformities, 1+ edema.  Skin: Intact, warm, no rashes.  Labs/imaging personally reviewed:  CTA chest/abd/pelv 12/13 > bilateral pneumonia, cholelithiasis without findings of acute cholecystitis, nonobstructive bilateral nephrolithiasis, colonic diverticulosis. Echo 12/13 >    Assessment & Plan:   Acute on chronic hypoxic respiratory failure - multifactorial in setting of probable community-acquired pneumonia, pulmonary edema, baseline COPD (per report, no PFT's) on 4 L O2, OSA on nocturnal BiPAP. - Continue BiPAP tonight. - Transition to nasal cannula in AM (chronically on 4L). - Continue CAP coverage with ceftriaxone and azithromycin. - Follow cultures. - Assess urine Legionella and urine strep. - Continue diuresis as able, goal net negative balance (assess diuresis dosing daily for now, holding home Furosemide). - Daily BNP and strict I/O's.  Hypertensive urgency. - Continue nitroglycerin infusion for now. - Add as needed hydralazine.  Troponin bump - presumed 2/2 above. - Trend troponin. - Cards consulted, appreciate the recs. - Assess echo.  AKI. - Continue diuresis as able, goal net negative balance. - Follow-up BMP.  Best practice (evaluated daily):  Diet/type: NPO DVT prophylaxis: prophylactic heparin  GI prophylaxis: [name] Lines: [name] Foley:  Yes, and it is still needed Code Status:  full code Last date of multidisciplinary goals of care discussion: None yet.  Labs   CBC: Recent Labs  Lab 01/16/22 1858 01/16/22 2052  WBC  --  17.1*  NEUTROABS  --  13.6*  HGB 15.0 14.0  HCT 44.0 42.6  MCV  --  92.6  PLT  --  627    Basic Metabolic Panel: Recent Labs  Lab 01/16/22 1831 01/16/22 1858   NA 140 141  K 3.7 3.6  CL 105  --   CO2 23  --   GLUCOSE 126*  --   BUN 20  --   CREATININE 1.40*  --   CALCIUM 10.0  --    GFR: Estimated Creatinine Clearance: 59.4 mL/min (A) (by C-G formula based on SCr of 1.4 mg/dL (H)). Recent Labs  Lab 01/16/22 1933 01/16/22 2052 01/17/22 0012  WBC  --  17.1*  --   LATICACIDVEN 2.7*  --  1.8    Liver Function Tests: Recent Labs  Lab 01/16/22 1831  AST 43*  ALT 38  ALKPHOS 88  BILITOT 0.9  PROT 7.3  ALBUMIN 3.1*   No results for input(s): "LIPASE", "AMYLASE" in the last 168 hours. No results for input(s): "AMMONIA" in the last 168 hours.  ABG    Component Value Date/Time   PHART 7.45 09/22/2021 1418   PCO2ART 40 09/22/2021 1418   PO2ART 74 (L) 09/22/2021 1418   HCO3 27.2 01/16/2022 1858   TCO2 29 01/16/2022 1858   O2SAT 97 01/16/2022 1858     Coagulation Profile: No results for input(s): "INR", "PROTIME" in the last 168 hours.  Cardiac Enzymes: No results for input(s): "CKTOTAL", "CKMB", "CKMBINDEX", "TROPONINI" in the last 168 hours.  HbA1C: Hgb A1c MFr Bld  Date/Time Value Ref Range Status  07/11/2021 11:45 AM 6.3 (H) 4.8 - 5.6 % Final    Comment:  Prediabetes: 5.7 - 6.4          Diabetes: >6.4          Glycemic control for adults with diabetes: <7.0   06/23/2020 10:46 AM 6.1 (H) 4.8 - 5.6 % Final    Comment:             Prediabetes: 5.7 - 6.4          Diabetes: >6.4          Glycemic control for adults with diabetes: <7.0     CBG: No results for input(s): "GLUCAP" in the last 168 hours.  Review of Systems:   Unable to obtain as pt is on BiPAP.  Past Medical History:  He,  has a past medical history of Ankylosing spondylitis ([place]) (11/07/2018), Asthma, BPH (benign prostatic hyperplasia), CAD (coronary artery disease) (01/1995), CHF (congestive heart failure) ([place]), Clostridium difficile colitis (04/2005), Colitis (2011), COPD (chronic obstructive pulmonary disease) ([place]), Depression,  Diverticulosis, DJD (degenerative joint disease), Gastric ulcer (04/17/10), GERD (gastroesophageal reflux disease), History of kidney stones, Hyperlipidemia, Hypertension, Idiopathic chronic inflammatory bowel disease (05/18/2010), Kidney stone, Morbid obesity ([place]) (03/12/2018), Obstructive sleep apnea, Reflux (02/1995), and S/P endoscopy (07/24/10).   Surgical History:   Past Surgical History:  Procedure Laterality Date   ANORECTAL MANOMETRY  2016   [facility]: concern for possible fissure. Noted pelvic floor dyssnyergy   BIOPSY  07/29/2017   Procedure: BIOPSY;  Surgeon: Daneil Dolin, MD;  Location: [facility];  Service: Endoscopy;;  ascending and sigmoid colon   CARDIAC CATHETERIZATION     with stent   CARDIOVASCULAR STRESS TEST  07/21/2009   No scintigraphic evidence of inducible myocardial ischemia   CARPAL TUNNEL RELEASE Left 01/17/2017   Procedure: LEFT CARPAL TUNNEL RELEASE;  Surgeon: Carole Civil, MD;  Location: [facility] ORS;  Service: Orthopedics;  Laterality: Left;   CATARACT EXTRACTION, BILATERAL Bilateral    CERVICAL SPINE SURGERY     C4-5   COLONOSCOPY  04/2005   granularity and friability erosions from rectum to 40cm. Bx infection vs IBD. C. Diff positive at the time.    COLONOSCOPY  05/2010   [facility]: left-sided UC, bx with no dysplasia, shallow diverticula   COLONOSCOPY [name] 11/07/2012   NOI:[id number] proctocolitis status post segmental biopsy/Sigmoid colon polyps removed as described above. Procedure compromisd by technical difficulties. bx: Inflammation limited to sigmoid and rectum on pathology.   COLONOSCOPY  04/2014   Dr. Nyoka Cowden at [facility]: well localized proctocolitis limited to sigmoid   COLONOSCOPY WITH PROPOFOL [name] 07/29/2017   diverticulosis in colon, three 4-6 mm polyps at splenic flexure and in cecum, one 10 mm polyp in rectum, abnormal rectum and sigmoid consistent with active UC   CORONARY STENT PLACEMENT  01/1995   ESOPHAGOGASTRODUODENOSCOPY  07/24/2010    QXI:HWTUUE esophagus   ESOPHAGOGASTRODUODENOSCOPY  04/2014   Dr. Nyoka Cowden at [facility]: negative small bowel biopsies   FOOT SURGERY Bilateral two   KNEE SURGERY  two   POLYPECTOMY  07/29/2017   Procedure: POLYPECTOMY;  Surgeon: Daneil Dolin, MD;  [facility];  Service: Endoscopy;;  splenic flexure, ascending colon polyp;rectal   SHOULDER SURGERY  two   TONSILLECTOMY     TRANSTHORACIC ECHOCARDIOGRAM  03/23/2009   EF 60-65%, normal LV systolic function   ULNAR HEAD EXCISION Left 01/17/2017   Procedure: LEFT ULNAR HEAD RESECTION;  Surgeon: Carole Civil, MD;  Location: [facility];  Service: Orthopedics;  Laterality: Left;     Social History:   reports  that he quit smoking about 52 years ago. His smoking use included cigarettes. He has a 20.00 pack-year smoking history. He has never used smokeless tobacco. He reports that he does not drink alcohol and does not use drugs.   Family History:  His family history includes Cancer in his mother; Diabetes in his mother; Hypertension in his father; Kidney failure in his brother; Lung cancer in his mother; Other in his child; Stroke in his father. There is no history of Colon cancer.   Allergies No Known Allergies   Home Medications  Prior to Admission medications   Medication Sig Start Date End Date Taking? Authorizing Provider  acetaminophen (TYLENOL) 500 MG tablet Take 2 tablets (1,000 mg total) by mouth every 6 (six) hours as needed for headache (pain). 11/24/20  Yes Luking, Elayne Snare, MD  albuterol (VENTOLIN HFA) 108 (90 Base) MCG/ACT inhaler Inhale 2 puffs into the lungs every 6 (six) hours as needed for wheezing or shortness of breath. 02/20/21  Yes [name], MD  aspirin EC 81 MG tablet Take 81 mg by mouth daily. Swallow whole.   Yes [provider]  celecoxib (CELEBREX) 100 MG capsule Take 100 mg by mouth every morning. 04/05/20  Yes [provider]  DULoxetine (CYMBALTA) 60 MG capsule TAKE 1 CAPSULE BY MOUTH  DAILY Patient taking differently: Take 60 mg by mouth daily. 01/04/22  Yes Luking, Elayne Snare, MD  furosemide (LASIX) 40 MG tablet ONE TO ONE AND A HALF TABLET EACH MORNING AS NEEDED Patient taking differently: Take 40 mg by mouth every morning. 06/23/20  Yes Luking, Elayne Snare, MD  ketoconazole (NIZORAL) 2 % cream APPLY TOPICALLY TWICE DAILY. [place] WITH BARRIER CREAM SUCH AS [place] Patient taking differently: Apply 1 Application topically daily as needed for irritation. MIX WITH BARRIER CREAM SUCH AS DESITIN 07/11/21  Yes [name], MD  metoprolol tartrate (LOPRESSOR) 50 MG tablet TAKE 1/2 TABLET(25 MG) BY MOUTH EVERY EVENING Patient taking differently: Take 25 mg by mouth in the morning. 09/11/21  Yes [name], MD  mupirocin ointment (BACTROBAN) 2 % Apply 1 Application topically 2 (two) times daily as needed (wound care/raw areas). 09/20/21  Yes Luking, Elayne Snare, MD  nitroGLYCERIN (NITROSTAT) 0.4 MG SL tablet ONE TABLET UNDER TONGUE AS NEEDED FOR CHEST PAIN EVERY 5 MINUTES AS DIRECTED Patient taking differently: Place 0.4 mg under the tongue every 5 (five) minutes as needed for chest pain. 10/14/20  Yes Lorretta Harp, MD  nystatin cream (MYCOSTATIN) Apply 1 application topically 2 (two) times daily as needed (rash/itching). 12/28/20  Yes Luking, Elayne Snare, MD  pravastatin (PRAVACHOL) 40 MG tablet TAKE 1 TABLET(40 MG) BY MOUTH DAILY Patient taking differently: Take 40 mg by mouth daily. 12/06/21  Yes [name], MD  pyridOXINE (VITAMIN B-6) 100 MG tablet Take 100 mg by mouth daily.   Yes [provider]  triamcinolone cream (KENALOG) 0.1 % Apply 1 Application topically 2 (two) times daily. Patient taking differently: Apply 1 Application topically daily as needed (for itching). 09/20/21  Yes [name], MD  vitamin C (ASCORBIC ACID) 500 MG tablet Take 500 mg by mouth daily.   Yes [provider]  Vitamin D, Ergocalciferol, (DRISDOL) 1.25 MG (50000 UNIT) CAPS capsule TAKE 1  CAPSULE BY MOUTH EVERY 7 DAYS Patient taking differently: Take 50,000 Units by mouth every 7 (seven) days. Monday 01/02/21  Yes [name], MD     Critical care time: 45 min.   Mukesh Kornegay  Shearon Stalls, [place] For pager details, please see AMION or use Epic chat  After 1900, please call [facility] for cross coverage needs 01/17/2022, 1:04 AM

## 2022-01-17 NOTE — Telephone Encounter (Signed)
Call received from patient's daughter states he is in [facility] in the intensive care unit heart and vascular, double pneumonia - [place]

## 2022-01-17 NOTE — Telephone Encounter (Signed)
1.  Certainly sorry to hear this 2.  They have been sending me updates I am able to follow along 3.  It will be important for [place] to do a follow-up office visit within a few days of coming home-in some situations individuals are too weak to come home and instead have to go into short-term rehab-it will be important as he goes through this hospitalization if [name] feels that he is not improving enough for them to take him home they need to discuss this openly with the hospitalist so that the hospital can work on short-term rehab set up ahead of time.(In other words do not wait till the day of discharge to bring this up) but on the other side of the going if he improves enough to where they can take him home we would like to do a follow-up within a few days of coming home

## 2022-01-17 NOTE — Progress Notes (Signed)
Pt transported to [facility] by RN. RT took bipap to pt room. Report given to RT on unit.

## 2022-01-17 NOTE — Consult Note (Signed)
Cardiology Consultation   Patient ID: [name] MRN: [id number]; DOB: Jun 29, 1940  Admit date: 01/16/2022 Date of Consult: 01/17/2022  PCP:  [name], MD   [facility] Cardiologist:  Quay Burow, MD        Patient Profile:   [name] is a 81 y.o. male with a hx of CAD, COPD, OSA, Dementia and HFpEF who is being seen 01/17/2022 for the evaluation of SOB at the request of Dr. Nechama Guard  History of Present Illness:   Mr. [name] is a 81 y.o. male with a hx of CAD, COPD, OSA, Dementia and HFpEF who is being seen 01/17/2022 for the evaluation of SOB at the request of Dr. Nechama Guard. He follows with family medicine. Per daughter, patient has been having more SOB since 3 days. She had also noticed swelling in legs and arms. Yesterday she documented a fever of 101.4; he also had rigors and chills. He also has been lethargic for past 3 days with intermittent confusion. Denies any chest pain. Takes lasix 60 at home; but may have missed some doses. He is normally on 4 L nasal cannula during the day and BiPAP at night. EMS was called by family at home because was satting 85% on room air and put on nonrebreather by EMS. Patient was tachypnic; and had difficulty answering questions. In the ED, EKG showed LVH with repol. Troponin 400 then 500; lactate 2.7; WBC 17. CXR showed cardiomegaly. CT showed PNA bilaterally. COVID RSV and flu all negative. In the ED, patient started on BiPAP, nitroglycerin infusion, Rocephin and azithromycin for superimposed pneumonia and started diuresis with Lasix 60 mg. Patient wanted to be full code.     Past Medical History:  Diagnosis Date   Ankylosing spondylitis ([place]) 11/07/2018   Asthma    BPH (benign prostatic hyperplasia)    CAD (coronary artery disease) 01/1995   CHF (congestive heart failure) ([name])    Clostridium difficile colitis 04/2005   Colitis 2011   COPD (chronic obstructive pulmonary disease) ([name])     Depression    Diverticulosis    DJD (degenerative joint disease)    Gastric ulcer 04/17/10   Three 54m gastric ulcers, H.pylori serologies were negative   GERD (gastroesophageal reflux disease)    History of kidney stones    Hyperlipidemia    Hypertension    Idiopathic chronic inflammatory bowel disease 05/18/2010   left-sided UC   Kidney stone    [place]bid [place] 03/12/2018   Obstructive sleep apnea    on Cpap   Reflux 02/1995   S/P endoscopy 07/24/10   retained gastric contents, benign bx    Past Surgical History:  Procedure Laterality Date   ANORECTAL MANOMETRY  2016   [facility]: concern for possible fissure. Noted pelvic floor dyssnyergy   BIOPSY  07/29/2017   Procedure: BIOPSY;  Surgeon: RDaneil Dolin MD;  [facility];  Service: Endoscopy;;  ascending and sigmoid colon   CARDIAC CATHETERIZATION     with stent   CARDIOVASCULAR STRESS TEST  07/21/2009   No scintigraphic evidence of inducible myocardial ischemia   CARPAL TUNNEL RELEASE Left 01/17/2017   Procedure: LEFT CARPAL TUNNEL RELEASE;  Surgeon: HCarole Civil MD;  Location: [facility];  Service: Orthopedics;  Laterality: Left;   CATARACT EXTRACTION, BILATERAL Bilateral    CERVICAL SPINE SURGERY     C4-5   COLONOSCOPY  04/2005   granularity and friability erosions from rectum to 40cm. Bx infection vs IBD.  C. Diff positive at the time.    COLONOSCOPY  05/2010   [facility]: left-sided UC, bx with no dysplasia, shallow diverticula   COLONOSCOPY [name] 11/07/2012   YBW:LSLH-TDSKA proctocolitis status post segmental biopsy/Sigmoid colon polyps removed as described above. Procedure compromisd by technical difficulties. bx: Inflammation limited to sigmoid and rectum on pathology.   COLONOSCOPY  04/2014   Dr. Nyoka Cowden at [facility]: well localized proctocolitis limited to sigmoid   COLONOSCOPY WITH PROPOFOL [name] 07/29/2017   diverticulosis in colon, three 4-6 mm polyps at splenic flexure and in cecum, one 10 mm polyp in rectum,  abnormal rectum and sigmoid consistent with active UC   CORONARY STENT PLACEMENT  01/1995   ESOPHAGOGASTRODUODENOSCOPY  07/24/2010   JGO:TLXBWI esophagus   ESOPHAGOGASTRODUODENOSCOPY  04/2014   Dr. [facility]oka Cowden at [facility]: negative small bowel biopsies   FOOT SURGERY Bilateral two   KNEE SURGERY  two   POLYPECTOMY  07/29/2017   Procedure: POLYPECTOMY;  Surgeon: Daneil Dolin, MD;  [facility];  Service: Endoscopy;;  splenic flexure, ascending colon polyp;rectal   SHOULDER SURGERY  two   TONSILLECTOMY     TRANSTHORACIC ECHOCARDIOGRAM  03/23/2009   EF 60-65%, normal LV systolic function   ULNAR HEAD EXCISION Left 01/17/2017   Procedure: LEFT ULNAR HEAD RESECTION;  Surgeon: Carole Civil, MD;  Location: [facility];  Service: Orthopedics;  Laterality: Left;       Inpatient Medications: Scheduled Meds:  acetaminophen  1,000 mg Oral Once   heparin  5,000 Units Subcutaneous Q8H   Continuous Infusions:  azithromycin     cefTRIAXone (ROCEPHIN)  IV     nitroGLYCERIN 25 mcg/min (01/17/22 0014)   vancomycin 2,500 mg (01/17/22 0013)   PRN Meds: docusate sodium, hydrALAZINE, polyethylene glycol  Allergies:   No Known Allergies  Social History:   Social History   Socioeconomic History   Marital status: Widowed    Spouse name: Not on file   Number of children: Not on file   Years of education: Not on file   Highest education level: Not on file  Occupational History   Occupation: maintenance    Comment: retired  Tobacco Use   Smoking status: Former    Packs/day: 1.00    Years: 20.00    Total pack years: 20.00    Types: Cigarettes    Quit date: 09/30/1969    Years since quitting: 52.3   Smokeless tobacco: Never  Vaping Use   Vaping Use: Never used  Substance and Sexual Activity   Alcohol use: No   Drug use: No   Sexual activity: Yes    Partners: Female    Birth control/protection: None    Comment: spouse  Other Topics Concern   Not on file  Social History  Narrative   Not on file   Social Determinants of Health   Financial Resource Strain: Low Risk  (01/31/2021)   Overall Financial Resource Strain (CARDIA)    Difficulty of Paying Living Expenses: Not hard at all  Food Insecurity: No Food Insecurity (01/31/2021)   Hunger Vital Sign    Worried About Running Out of Food in the Last Year: Never true    [place] in the Last Year: Never true  Transportation Needs: No Transportation Needs (01/31/2021)   PRAPARE - Hydrologist (Medical): No    Lack of Transportation (Non-Medical): No  Physical Activity: Inactive (01/31/2021)   Exercise Vital Sign    Days of Exercise per Week:  0 days    Minutes of Exercise per Session: 0 min  Stress: Stress Concern Present (01/31/2021)   [place]    Feeling of Stress : To some extent  Social Connections: Moderately Integrated (01/31/2021)   Social Connection and Isolation Panel [NHANES]    Frequency of Communication with Friends and Family: More than three times a week    Frequency of Social Gatherings with Friends and Family: More than three times a week    Attends Religious Services: 1 to 4 times per year    Active Member of [employer] or Organizations: Yes    Attends Archivist Meetings: 1 to 4 times per year    Marital Status: Widowed  Intimate Partner Violence: Not At Risk (01/31/2021)   Humiliation, Afraid, Rape, and Kick questionnaire    Fear of Current or Ex-Partner: No    Emotionally Abused: No    Physically Abused: No    Sexually Abused: No    Family History:   Family History  Problem Relation Age of Onset   Lung cancer Mother    Cancer Mother        breast   Diabetes Mother    Stroke Father    Hypertension Father    Kidney failure Brother    Other Child        blood infection   Colon cancer Neg Hx      ROS:  Please see the history of present illness.  All other ROS  reviewed and negative.     Physical Exam/Data:   Vitals:   01/16/22 2145 01/16/22 2302 01/16/22 2309 01/16/22 2345  BP: (!) 174/82   (!) 182/82  Pulse: (!) 114 (!) 118  (!) 113  Resp: (!) 31 (!) 44  (!) 29  Temp:      TempSrc:      SpO2: 96% 96% 96% 96%  Weight:   (!) 142.9 kg   Height:   6' 1"  (1.854 m)     Intake/Output Summary (Last 24 hours) at 01/17/2022 0025 Last data filed at 01/17/2022 0005 Gross per 24 hour  Intake 600 ml  Output --  Net 600 ml      01/16/2022   11:09 PM 12/21/2021    3:06 PM 09/20/2021    2:11 PM  Last 3 Weights  Weight (lbs) 315 lb 315 lb 310 lb  Weight (kg) 142.883 kg 142.883 kg 140.615 kg     Body mass index is 41.56 kg/m.  General:  Well nourished, well developed, moderate distress; BIPAP.  HEENT: normal Neck: no JVD Vascular: No carotid bruits; Distal pulses 2+ bilaterally Cardiac: Tachycardic; S1 and S2.  Lungs:  Poor airflow bilaterally; with crackles at bases Abd: soft, nontender, no hepatomegaly  Ext: Pitting edema Musculoskeletal:  No deformities, BUE and BLE strength normal and equal Skin: warm and dry  Neuro:  CNs 2-12 intact, no focal abnormalities noted Psych:  Normal affect; slightly confusef  EKG:  The EKG was personally reviewed and demonstrates:  LVH with repol abnormalities  Relevant CV Studies:  Echo 2021:  1. Left ventricular ejection fraction, by estimation, is 60 to 65%. The  left ventricle has normal function. The left ventricle has no regional  wall motion abnormalities. There is moderate left ventricular hypertrophy.  Left ventricular diastolic  parameters are consistent with Grade I diastolic dysfunction (impaired  relaxation).   2. Right ventricular systolic function is normal. The right ventricular  size is normal.  Tricuspid regurgitation signal is inadequate for assessing  PA pressure.   3. Left atrial size was mildly dilated.   4. The mitral valve is normal in structure. Trivial mitral valve   regurgitation. No evidence of mitral stenosis.   5. The aortic valve is tricuspid. Aortic valve regurgitation is not  visualized. Mild aortic valve sclerosis is present, with no evidence of  aortic valve stenosis.   6. Aortic dilatation noted. There is mild dilatation of the aortic root.   7. The inferior vena cava is normal in size with greater than 50%  respiratory variability, suggesting right atrial pressure of 3 mmHg.    Laboratory Data:  High Sensitivity Troponin:   Recent Labs  Lab 01/16/22 1831 01/16/22 2015  TROPONINIHS 402* 540*     Chemistry Recent Labs  Lab 01/16/22 1831 01/16/22 1858  NA 140 141  K 3.7 3.6  CL 105  --   CO2 23  --   GLUCOSE 126*  --   BUN 20  --   CREATININE 1.40*  --   CALCIUM 10.0  --   GFRNONAA 50*  --   ANIONGAP 12  --     Recent Labs  Lab 01/16/22 1831  PROT 7.3  ALBUMIN 3.1*  AST 43*  ALT 38  ALKPHOS 88  BILITOT 0.9   Lipids No results for input(s): "CHOL", "TRIG", "HDL", "LABVLDL", "LDLCALC", "CHOLHDL" in the last 168 hours.  Hematology Recent Labs  Lab 01/16/22 1858 01/16/22 2052  WBC  --  17.1*  RBC  --  4.60  HGB 15.0 14.0  HCT 44.0 42.6  MCV  --  92.6  MCH  --  30.4  MCHC  --  32.9  RDW  --  14.2  PLT  --  227   Thyroid No results for input(s): "TSH", "FREET4" in the last 168 hours.  BNP Recent Labs  Lab 01/16/22 1831  BNP 192.4*    DDimer No results for input(s): "DDIMER" in the last 168 hours.   Radiology/Studies:  CT Angio Chest/Abd/Pel for Dissection W and/or Wo Contrast  Result Date: 01/16/2022 CLINICAL DATA:  history of AAA, in HF, complaining of abdominal pain, vomiting, SOB. Ulcerative colitis. EXAM: CT ANGIOGRAPHY CHEST, ABDOMEN AND PELVIS TECHNIQUE: Non-contrast CT of the chest was initially obtained. Multidetector CT imaging through the chest, abdomen and pelvis was performed using the standard protocol during bolus administration of intravenous contrast. Multiplanar reconstructed images  and MIPs were obtained and reviewed to evaluate the vascular anatomy. RADIATION DOSE REDUCTION: This exam was performed according to the departmental dose-optimization program which includes automated exposure control, adjustment of the mA and/or kV according to patient size and/or use of iterative reconstruction technique. CONTRAST:  171m OMNIPAQUE IOHEXOL 350 MG/ML SOLN COMPARISON:  None Available. FINDINGS: CTA CHEST FINDINGS Cardiovascular: Preferential opacification of the thoracic aorta. No evidence of thoracic aortic aneurysm or dissection. normal heart size. No significant pericardial effusion. Moderate to severe atherosclerotic plaque of the thoracic aorta. Four-vessel coronary artery calcifications. No central pulmonary embolus. Main pulmonary artery is mildly enlarged measuring up to 3.2 cm. Unable to evaluate more distally due to timing of contrast. Mediastinum/Nodes: No enlarged mediastinal, hilar, or axillary lymph nodes. Thyroid gland, trachea, and esophagus demonstrate no significant findings. Lungs/Pleura: Severe centrilobular and at least moderate paraseptal emphysematous changes. Heterogeneous bilateral lower lobe likely airspace opacities. Right middle lobe peribronchovascular patchy airspace opacities. No pulmonary nodule. No pulmonary mass. No pleural effusion. No pneumothorax. Musculoskeletal: No chest wall abnormality. No suspicious lytic  or blastic osseous lesions. No acute displaced fracture. Continuous bridging osteophytes and spinous process ligament calcification. At least moderate degenerative changes of the bilateral shoulders. Review of the MIP images confirms the above findings. CTA ABDOMEN AND PELVIS FINDINGS VASCULAR Aorta: Severe normal caliber aorta without aneurysm, dissection, vasculitis or significant stenosis. Celiac: Moderate atherosclerotic plaque with moderate narrowing of the origin. Patent without evidence of aneurysm, dissection, vasculitis or significant stenosis.  SMA: Moderate atherosclerotic plaque with moderate narrowing of the origin. Patent without evidence of aneurysm, dissection, vasculitis or significant stenosis. Renals: Both renal arteries are patent without evidence of aneurysm, dissection, vasculitis, fibromuscular dysplasia or significant stenosis. IMA: Patent without evidence of aneurysm, dissection, vasculitis or significant stenosis. Inflow: Mild-to-moderate patent without evidence of aneurysm, dissection, vasculitis or significant stenosis. Veins: No obvious venous abnormality within the limitations of this arterial phase study. Review of the MIP images confirms the above findings. NON-VASCULAR Hepatobiliary: No focal liver abnormality. Layering calcified stones noted within the gallbladder lumen. No gallbladder wall thickening or pericholecystic fluid. No biliary dilatation. Pancreas: No focal lesion. Normal pancreatic contour. No surrounding inflammatory changes. No main pancreatic ductal dilatation. Spleen: Normal in size without focal abnormality. Adrenals/Urinary Tract: No adrenal nodule bilaterally. Bilateral kidneys enhance symmetrically. Punctate to 2 mm calcifications within the kidneys. No ureterolithiasis. No hydronephrosis. No hydroureter. The urinary bladder is unremarkable. Stomach/Bowel: Stomach is within normal limits. No evidence of bowel wall thickening or dilatation. Loss of haustral markings along the rectosigmoid colon suggestive of ulcerative colitis. Colonic diverticulosis. Appendix appears normal. Lymphatic: No lymphadenopathy. Reproductive: Prostate is unremarkable. Other: No intraperitoneal free fluid. No intraperitoneal free gas. No organized fluid collection. Musculoskeletal: Tiny fat containing umbilical hernia. Right hip subcutaneus soft tissue edema possible hematoma formation (7:310). No suspicious lytic or blastic osseous lesions. No acute displaced fracture. Continuous bridging osteophyte formation. Partial ankylosis of the  sacroiliac joints. Review of the MIP images confirms the above findings. IMPRESSION: 1. No acute thoracic or abdominal aorta abnormality. 2. 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 3. No central pulmonary embolus. Unable to evaluate more distally due to timing of contrast. 4. Bilateral lower lobe airspace opacities as well as right middle lobe peribronchovascular patchy airspace opacities consistent with pneumonia. 5. Cholelithiasis with no CT finding of acute cholecystitis. 6. Nonobstructive bilateral nephrolithiasis. 7. Colonic diverticulosis with no acute diverticulitis. 8. Ankylosing spondylitis in a patient with ulcers colitis. 9. Right hip subcutaneus soft tissue edema possible hematoma formation. No underlying acute fracture. Electronically Signed   By: Iven Finn M.D.   On: 01/16/2022 22:48   DG Chest 1 View  Result Date: 01/16/2022 CLINICAL DATA:  [id number] SOB (shortness of breath) [id number] EXAM: CHEST  1 VIEW COMPARISON:  Chest x-ray 08/21/2021 FINDINGS: Enlarged cardiac silhouette. The heart and mediastinal contours are unchanged given change in technique. Aortic calcification. Low lung volumes. Bibasilar airspace opacities. Increased interstitial markings. Bilateral trace to small volume, left greater than right, pleural effusions. No pneumothorax. No acute osseous abnormality. IMPRESSION: 1. Cardiomegaly with pulmonary edema. 2. Bilateral trace to small volume, left greater than right, pleural effusions. 3. Bibasilar airspace opacities may represent combination of atelectasis versus infection/inflammation. 4.  Aortic Atherosclerosis (ICD10-I70.0). Electronically Signed   By: Iven Finn M.D.   On: 01/16/2022 19:25     Assessment and Plan:   # Acute on Chronic HFpEF # Bilateral PNA # HTN Urgency # COPD # OSA # Acute hypoxic respiratory Failure # Elevated Troponin  -Acute  hypoxic RF multi-factorial most  likely. He has had fevers, confusion and CT shows bilateral opacities with leukocytosis concerning for PNA. He also does seem volume overloaded + HTN urgency in the setting of HFpEF history; BNP 190.  -Would start a nitro drip for high BP- would be cautious and closely follow BP as he has ?PNA.  -IV Lasix 80 BID -Strict I/O -Echocardiogram -No need for heparin as no CP and EKG benign. Most likely troponin elevation is due to demand -Agree with full infectious work up, and initiation of broad spectrum Abx -BIPAP to keep O2 >92 and improve work of breathing. Currently Full code; he is tachypnic. Airway status will need to be monitored carefully   For questions or updates, please contact Will Please consult [email or web address] for contact info under    Signed, Jaci Lazier, MD  01/17/2022 12:25 AM

## 2022-01-17 NOTE — Telephone Encounter (Signed)
Spoke with Ms Cozart to let her know per drs notes per recommendations.

## 2022-01-17 NOTE — Progress Notes (Signed)
Pt taken off bipap by RN at [place]. Currently on 14L 55% venturi mask. RT will continue to monitor and be available as needed.

## 2022-01-17 NOTE — ED Notes (Signed)
IV team at bedside 

## 2022-01-17 NOTE — Progress Notes (Incomplete)
Echocardiogram 2D Echocardiogram has been performed.  Ronny Flurry 01/17/2022, 2:49 PM

## 2022-01-17 NOTE — ED Notes (Signed)
Dr. Tacy Learn at bedside

## 2022-01-17 NOTE — Progress Notes (Signed)
81 year old male with morbid obesity, obstructive sleep apnea and chronic HFpEF who was brought into the emergency department with increasing shortness of breath and cough, in the emergency department he was noted to be febrile with Tmax of 101.8 and hypertensive, patient was given Lasix and was placed on BiPAP, PCCM was consulted for help evaluation medical management   Physical exam: General: Elderly morbidly obese male, lying on the bed, on BiPAP HEENT: [place]/AT, eyes anicteric.  moist mucus membranes Neuro: Lethargic, opens eyes with vocal stimuli, following simple commands Chest: Bilateral basal crackles, no wheezes or rhonchi Heart: Regular rate and rhythm, no murmurs or gallops Abdomen: Soft, nontender, nondistended, bowel sounds present Skin: No rash  Labs and images were reviewed  Assessment and plan: Acute on chronic hypoxic respiratory failure Acute on chronic HFpEF Bilateral lower lobe pneumonia, could be related to aspiration Hypertensive emergency associated with pulmonary edema Demand cardiac ischemia Acute kidney injury likely due to cardiorenal syndrome Hypokalemia Morbid obesity  Continue BiPAP for now, will give trial of coming off of BiPAP with Ventimask Continue aggressive diuresis Monitor intake and output Avoid nephrotoxic agent CT angiogram of chest is suggestive of bilateral infiltrates consistent with pneumonia especially patient is spiked fever in the emergency department Continue ceftriaxone and azithromycin for now Follow-up cultures Blood pressure is better controlled, currently on nitroglycerin infusion Will start him on Imdur and amlodipine Echocardiogram is pending, ideally patient should be started on beta-blocker but in acute exacerbation I will hold off Troponin remain elevated but patient denies chest pain Patient baseline serum creatinine is around 1.1, now it is elevated to 1.5 likely due to cardiorenal syndrome Continue aggressive electrolyte  supplement    This patient is critically ill with multiple organ system failure which requires frequent high complexity decision making, assessment, support, evaluation, and titration of therapies. This was completed through the application of advanced monitoring technologies and extensive interpretation of multiple databases.  During this encounter critical care time was devoted to patient care services described in this note for 34 minutes.    Jacky Kindle, MD [place] [facility] See Amion for pager If no response to pager, please call [phone] until 7pm After 7pm, Please call E-link [phone]

## 2022-01-17 NOTE — Progress Notes (Signed)
I was called into the patient room because patient's daughter Leandro Berkowitz requested that patient is DNR/DNI and that she has paperwork at home. She would like [place] to keep him DNR/DNI per patient's own wishes, she will bring paperwork tomorrow.  In case patient is in respiratory distress she would like her to be called and does not take direction from other daughter named [name]   DPOAH: Josie Dixon.   DNR/I orders written     Jacky Kindle, MD [place] [facility] See Amion for pager If no response to pager, please call [phone] until 7pm After 7pm, Please call E-link [phone]

## 2022-01-18 ENCOUNTER — Other Ambulatory Visit: Payer: Self-pay

## 2022-01-18 ENCOUNTER — Inpatient Hospital Stay (HOSPITAL_COMMUNITY): Payer: Medicare Other

## 2022-01-18 DIAGNOSIS — I169 Hypertensive crisis, unspecified: Secondary | ICD-10-CM

## 2022-01-18 DIAGNOSIS — G4733 Obstructive sleep apnea (adult) (pediatric): Secondary | ICD-10-CM

## 2022-01-18 DIAGNOSIS — J9621 Acute and chronic respiratory failure with hypoxia: Secondary | ICD-10-CM | POA: Diagnosis not present

## 2022-01-18 LAB — LEGIONELLA PNEUMOPHILA SEROGP 1 UR AG: L. pneumophila Serogp 1 Ur Ag: NEGATIVE

## 2022-01-18 LAB — BASIC METABOLIC PANEL
Anion gap: 10 (ref 5–15)
BUN: 22 mg/dL (ref 8–23)
CO2: 27 mmol/L (ref 22–32)
Calcium: 9.7 mg/dL (ref 8.9–10.3)
Chloride: 103 mmol/L (ref 98–111)
Creatinine, Ser: 1.5 mg/dL — ABNORMAL HIGH (ref 0.61–1.24)
GFR, Estimated: 46 mL/min — ABNORMAL LOW (ref >60)
Glucose, Bld: 118 mg/dL — ABNORMAL HIGH (ref 70–99)
Potassium: 3.4 mmol/L — ABNORMAL LOW (ref 3.5–5.1)
Sodium: 140 mmol/L (ref 135–145)

## 2022-01-18 LAB — CBC
HCT: 39.8 % (ref 39.0–52.0)
Hemoglobin: 12.9 g/dL — ABNORMAL LOW (ref 13.0–17.0)
MCH: 30.4 pg (ref 26.0–34.0)
MCHC: 32.4 g/dL (ref 30.0–36.0)
MCV: 93.9 fL (ref 80.0–100.0)
Platelets: 228 10*3/uL (ref 150–400)
RBC: 4.24 MIL/uL (ref 4.22–5.81)
RDW: 14.1 % (ref 11.5–15.5)
WBC: 12.7 10*3/uL — ABNORMAL HIGH (ref 4.0–10.5)
nRBC: 0 % (ref 0.0–0.2)

## 2022-01-18 LAB — MAGNESIUM: Magnesium: 2.2 mg/dL (ref 1.7–2.4)

## 2022-01-18 LAB — PHOSPHORUS: Phosphorus: 3.2 mg/dL (ref 2.5–4.6)

## 2022-01-18 MED ORDER — DULOXETINE HCL 60 MG PO CPEP
60.0000 mg | ORAL_CAPSULE | Freq: Every day | ORAL | Status: DC
  Administered 2022-01-18 – 2022-01-26 (×8): 60 mg via ORAL
  Filled 2022-01-18 (×9): qty 1

## 2022-01-18 MED ORDER — DULOXETINE HCL 60 MG PO CPEP: 60.0000 mg | ORAL_CAPSULE | Freq: Every day | ORAL | Status: DC

## 2022-01-18 MED ORDER — AMLODIPINE BESYLATE 10 MG PO TABS
10.0000 mg | ORAL_TABLET | Freq: Every day | ORAL | Status: DC
  Administered 2022-01-18 – 2022-01-26 (×8): 10 mg via ORAL
  Filled 2022-01-18 (×9): qty 1

## 2022-01-18 MED ORDER — FUROSEMIDE 10 MG/ML IJ SOLN
60.0000 mg | Freq: Two times a day (BID) | INTRAMUSCULAR | Status: DC
  Administered 2022-01-18 – 2022-01-19 (×4): 60 mg via INTRAVENOUS
  Filled 2022-01-18 (×5): qty 6

## 2022-01-18 MED ORDER — POTASSIUM CHLORIDE 10 MEQ/100ML IV SOLN
10.0000 meq | INTRAVENOUS | Status: AC
  Administered 2022-01-18 (×4): 10 meq via INTRAVENOUS
  Filled 2022-01-18 (×4): qty 100

## 2022-01-18 MED ORDER — ACETAMINOPHEN 650 MG RE SUPP: 650.0000 mg | Freq: Four times a day (QID) | RECTAL | Status: DC | PRN

## 2022-01-18 MED ORDER — ALBUTEROL SULFATE (2.5 MG/3ML) 0.083% IN NEBU: 3.0000 mL | INHALATION_SOLUTION | Freq: Four times a day (QID) | RESPIRATORY_TRACT | Status: DC | PRN

## 2022-01-18 MED ORDER — PRAVASTATIN SODIUM 40 MG PO TABS
40.0000 mg | ORAL_TABLET | Freq: Every day | ORAL | Status: DC
  Administered 2022-01-18 – 2022-01-26 (×8): 40 mg via ORAL
  Filled 2022-01-18 (×9): qty 1

## 2022-01-18 MED ORDER — ACETAMINOPHEN 325 MG PO TABS
650.0000 mg | ORAL_TABLET | Freq: Four times a day (QID) | ORAL | Status: DC | PRN
  Administered 2022-01-18 – 2022-01-21 (×4): 650 mg via ORAL
  Filled 2022-01-18 (×4): qty 2

## 2022-01-18 NOTE — Progress Notes (Signed)
NAME:  [name], MRN:  [id number], DOB:  1940-03-22, LOS: 1 ADMISSION DATE:  01/16/2022, CONSULTATION DATE:  01/17/22 REFERRING MD:  Nechama Guard CHIEF COMPLAINT:  Dyspnea   History of Present Illness:  [name] is a 81 y.o. male who has a PMH as below including but not limited to COPD on 4L O2, OSA on CPAP, CHF. He presented to [facility] ED 12/12 with dyspnea. He had experienced intermittent non bilious emesis earlier in the week and on 12/11, he developed fever with Tmax of 101.8.  He apparently has had a steady increase in his weight over the past few weeks, unsure exactly how much.  He had had mild increase in lower extremity edema as well.  In ED, he was found to be hypotensive and hypoxic with increased work of breathing.  He was subsequently placed on BiPAP and started on glycerin infusion.  Given 60 mg Lasix. He had chest x-ray that was concerning for pulmonary edema and multifocal pneumonia.  CT of the chest abdomen and pelvis demonstrated bilateral pneumonia, cholelithiasis without findings of acute cholecystitis, nonobstructive bilateral nephrolithiasis, colonic diverticulosis.  COVID, flu, RSV all negative.  Due to his ongoing needs of BiPAP along with hypertensive urgency, PCCM was called for admission.  I discussed with his daughter at baseline who confirmed that patient is full code in the event that he fails BiPAP.  She does express that he does wear BiPAP nocturnally every night and is very compliant with this.  She states that his breathing seems to be at baseline currently.  He is awake and alert and is asking for water to drink.  He has not been hospitalized recently or been on recent antibiotics.  Pertinent  Medical History:  has Diarrhea; Ulcerative colitis, left sided ([place]); Ulcerative colitis ([place]); Obstructive sleep apnea; Gallstones; RUQ pain; BPH (benign prostatic hyperplasia); Prediabetes; Chest pain at rest; CAD S/P percutaneous coronary angioplasty; Hiatal hernia;  GERD (gastroesophageal reflux disease); Essential hypertension; Dyslipidemia, goal LDL below 70; Erectile dysfunction; Hx of skin cancer, basal cell; Fracture, [place], left, closed; Closed fracture of distal radius and ulna, left, with malunion, subsequent encounter; Carpal tunnel syndrome of left wrist; History of carpal tunnel surgery of left wrist with unlar head resection 01/17/17; Morbid obesity ([place]); Exertional dyspnea; Fecal incontinence; Iron deficiency; Chronic diastolic CHF (congestive heart failure) ([place]); Rectal bleeding; Ankylosing spondylitis ([place]); Acute on chronic hypoxic respiratory failure ([place]); Acute on chronic respiratory failure with hypoxia and hypercapnia ([place]); Shortness of breath; COPD (chronic obstructive pulmonary disease) ([place]); Chronic ulcerative proctosigmoiditis, without complications ([place]); Chronic respiratory failure with hypoxia ([place]); Obesity with body mass index (BMI) of 30.0 to 39.9; DNR (do not resuscitate); Cervical spondylosis; Disseminated idiopathic skeletal hyperostosis; Dysphagia; Neck pain; Status post cervical spinal fusion; Syncope; Class 3 obesity ([place]); Head concussion; Bilateral hand fractures; Hematochezia; Depression, major, single episode, moderate ([place]); CAP (community acquired pneumonia); Acute on chronic congestive heart failure ([place]); and Acute pulmonary edema ([place]) on their problem list.  Significant Hospital Events: Including procedures, antibiotic start and stop dates in addition to other pertinent events   12/13 admit. 12/14 strep pneumo urinary antigen positive ABX narrowed.   Interim History / Subjective:  BiPAP used overnight per home regimen Now on 5L [place] with sats 92% (on 4L at home)  C/o dry mouth and thick mucous  Objective:  Blood pressure (!) 156/67, pulse 77, temperature 98.6 F (37 C), temperature source Axillary, resp. rate 18, height 6' 1"  (1.854 m), weight (!) 138.8 kg, SpO2  94 %.    FiO2 (%):  [45 %-55 %] 45 %    Intake/Output Summary (Last 24 hours) at 01/18/2022 0914 Last data filed at 01/18/2022 4166 Gross per 24 hour  Intake 1099.61 ml  Output 3650 ml  Net -2550.39 ml    Filed Weights   01/16/22 2309 01/18/22 0500  Weight: (!) 142.9 kg (!) 138.8 kg    Examination: General: Overweight elderly male in NAD Neuro: Alert, oriented, non-focal HEENT: [place]/AT, PERRL, no JVD Cardiovascular: RRR, no MRG Lungs: Diminished throughout all anterior lung fields. No rhonchi or wheeze.  Abdomen: Soft, NT. ND Musculoskeletal: Trace pretibial edema.  Skin: Grossly intact.   Labs/imaging personally reviewed:  CTA chest/abd/pelv 12/13 > bilateral pneumonia, cholelithiasis without findings of acute cholecystitis, nonobstructive bilateral nephrolithiasis, colonic diverticulosis. Echo 12/13 > LVEF 55-60%. Remainder of exam not well visualized.    Assessment & Plan:   Acute on chronic hypoxic respiratory failure - multifactorial in setting of probable community-acquired pneumonia, pulmonary edema, baseline COPD (per report, no PFT's) on 4 L O2, OSA on nocturnal BiPAP. Pneumococcal pneumonia - by urinary antigen 12/13 - Transition to nasal cannula in AM (chronically on 4L). - Narrow to CTX for 5 day course.  - Follow cultures - Diuresis per cardiology.  - Daily BNP and strict I/O's.  Hypertensive urgency - NTG drip off - continue Imdur - increase amlodipine to 10 - cardiology waiting to resume home BB - PRN hydralazine - ASA  HLD - restart home statin  Troponin bump - presumed 2/2 above. Peaked 540, trended down.  - Trend troponin. - Cards consulted, appreciate the recs.  AKI. - Continue diuresis as able, goal net negative balance. - Follow-up BMP.  OSA on BiPAP - Continue QHS BiPAP  Depression - restart home cymbalta  Transfer to tele unit and for [facility] 12/15  Best practice (evaluated daily):  Diet/type: NPO DVT prophylaxis: prophylactic heparin  GI prophylaxis: [name] Lines:  [name] Foley:  Yes, and it is still needed Code Status:  full code Last date of multidisciplinary goals of care discussion: None yet.  Critical care time:     Georgann Housekeeper, AGACNP-BC [facility]  See Amion for personal pager PCCM on call pager [phone] until 7pm. Please call Elink 7p-7a. [phone]  01/18/2022 9:30 AM

## 2022-01-18 NOTE — Progress Notes (Signed)
[facility] ADULT ICU REPLACEMENT PROTOCOL   The patient does apply for the [facility] Adult ICU Electrolyte Replacment Protocol based on the criteria listed below:   1.Exclusion criteria: TCTS, ECMO, Dialysis, and Myasthenia Gravis patients 2. Is GFR >/= 30 ml/min? Yes.    Patient's GFR today is 46 3. Is SCr </= 2? Yes.   Patient's SCr is 1.5 mg/dL 4. Did SCr increase >/= 0.5 in 24 hours? No. 5.Pt's weight >40kg  Yes.   6. Abnormal electrolyte(s):   K 3.4  7. Electrolytes replaced per protocol 8.  Call MD STAT for K+ </= 2.5, Phos </= 1, or Mag </= 1 Physician:  S. Rosie Fate R Gwenette Wellons 01/18/2022 6:24 AM

## 2022-01-18 NOTE — Progress Notes (Signed)
Progress Note  Patient Name: [name] Date of Encounter: 01/18/2022  Primary Cardiologist: Quay Burow, MD   Subjective   Patient seen and examined at his bedside.  Family members by the bedside during time of my arrival.  Net balance for last 24 hours -2435 mL.  Inpatient Medications    Scheduled Meds:  acetaminophen  1,000 mg Oral Once   amLODipine  5 mg Oral Daily   aspirin  81 mg Oral Daily   Chlorhexidine Gluconate Cloth  6 each Topical Daily   furosemide  40 mg Intravenous Q12H   heparin  5,000 Units Subcutaneous Q8H   isosorbide mononitrate  60 mg Oral Daily   Continuous Infusions:  azithromycin Stopped (01/17/22 2236)   cefTRIAXone (ROCEPHIN)  IV Stopped (01/17/22 2050)   nitroGLYCERIN Stopped (01/17/22 1327)   potassium chloride 100 mL/hr at 01/18/22 0748   PRN Meds: acetaminophen, docusate sodium, hydrALAZINE, polyethylene glycol   Vital Signs    Vitals:   01/18/22 0400 01/18/22 0500 01/18/22 0600 01/18/22 0700  BP: (!) 160/73 (!) 157/68 (!) 141/65 (!) 156/67  Pulse: 75 76 71 77  Resp: (!) 26 (!) 28 (!) 25 18  Temp: 98.1 F (36.7 C)     TempSrc: Axillary     SpO2: 91% 93% 90% 94%  Weight:  (!) 138.8 kg    Height:        Intake/Output Summary (Last 24 hours) at 01/18/2022 0750 Last data filed at 01/18/2022 0748 Gross per 24 hour  Intake 1099.61 ml  Output 3535 ml  Net -2435.39 ml   Filed Weights   01/16/22 2309 01/18/22 0500  Weight: (!) 142.9 kg (!) 138.8 kg    Telemetry    Sinus rhythm - Personally Reviewed  ECG    None today- Personally Reviewed  Physical Exam    General: Comfortable, with nasal cannula Head: Atraumatic, normal size  Eyes: PEERLA, EOMI  Neck: Supple, normal JVD Cardiac: Normal S1, S2; RRR; no murmurs, rubs, or gallops Lungs: Clear to auscultation bilaterally Abd: Soft, nontender, no hepatomegaly  Ext: warm, no edema Musculoskeletal: No deformities, BUE and BLE strength normal and equal Skin: Warm  and dry, no rashes   Neuro: Alert and oriented to person, place, time, and situation, CNII-XII grossly intact, no focal deficits  Psych: Normal mood and affect   Labs    Chemistry Recent Labs  Lab 01/16/22 1831 01/16/22 1858 01/17/22 0231 01/18/22 0426  NA 140 141 139 140  K 3.7 3.6 3.4* 3.4*  CL 105  --  104 103  CO2 23  --  23 27  GLUCOSE 126*  --  163* 118*  BUN 20  --  19 22  CREATININE 1.40*  --  1.51* 1.50*  CALCIUM 10.0  --  9.3 9.7  PROT 7.3  --   --   --   ALBUMIN 3.1*  --   --   --   AST 43*  --   --   --   ALT 38  --   --   --   ALKPHOS 88  --   --   --   BILITOT 0.9  --   --   --   GFRNONAA 50*  --  46* 46*  ANIONGAP 12  --  12 10     Hematology Recent Labs  Lab 01/16/22 2052 01/17/22 0231 01/18/22 0426  WBC 17.1* 18.3* 12.7*  RBC 4.60 4.40 4.24  HGB 14.0 13.3 12.9*  HCT 42.6  41.0 39.8  MCV 92.6 93.2 93.9  MCH 30.4 30.2 30.4  MCHC 32.9 32.4 32.4  RDW 14.2 14.2 14.1  PLT 227 217 228    Cardiac EnzymesNo results for input(s): "TROPONINI" in the last 168 hours. No results for input(s): "TROPIPOC" in the last 168 hours.   BNP Recent Labs  Lab 01/16/22 1831 01/17/22 0231  BNP 192.4* 159.1*     DDimer No results for input(s): "DDIMER" in the last 168 hours.   Radiology    ECHOCARDIOGRAM COMPLETE  Result Date: 01/17/2022    ECHOCARDIOGRAM REPORT   Patient Name:   [name] Date of Exam: 01/17/2022 Medical Rec #:  [id number]         Height:       73.0 in Accession #:    [id number]        Weight:       315.0 lb Date of Birth:  Oct 04, 1940         BSA:          2.610 m Patient Age:    44 years          BP:           134/61 mmHg Patient Gender: M                 HR:           79 bpm. Exam Location:  Inpatient Procedure: 2D Echo, Cardiac Doppler, Color Doppler and Intracardiac            Opacification Agent Indications:    Elevated Troponin  History:        Patient has prior history of Echocardiogram examinations, most                 recent  09/29/2019. CHF, CAD, COPD, Signs/Symptoms:Dyspnea, Chest                 Pain, Shortness of Breath and Syncope; Risk                 Factors:Hypertension, Sleep Apnea and Dyslipidemia.  Sonographer:    Ronny Flurry Referring Phys: [id number] RAHUL P DESAI IMPRESSIONS  1. Left ventricular ejection fraction, by estimation, is 55 to 60%. The left ventricle has normal function. The left ventricle has no regional wall motion abnormalities. There is moderate concentric left ventricular hypertrophy. Indeterminate diastolic filling due to E-A fusion.  2. Right ventricular systolic function was not well visualized. The right ventricular size is not well visualized.  3. The mitral valve was not well visualized. No evidence of mitral valve regurgitation.  4. The aortic valve was not well visualized. Aortic valve regurgitation is not visualized. No aortic stenosis is present.  5. Aortic dilatation noted. There is mild dilatation of the aortic root, measuring 41 mm.  6. The inferior vena cava is normal in size with greater than 50% respiratory variability, suggesting right atrial pressure of 3 mmHg. Comparison(s): No significant change from prior study. FINDINGS  Left Ventricle: Left ventricular ejection fraction, by estimation, is 55 to 60%. The left ventricle has normal function. The left ventricle has no regional wall motion abnormalities. Definity contrast agent was given IV to delineate the left ventricular  endocardial borders. The left ventricular internal cavity size was normal in size. There is moderate concentric left ventricular hypertrophy. Indeterminate diastolic filling due to E-A fusion. Right Ventricle: The right ventricular size is not well visualized. Right vetricular wall thickness was not well visualized. Right ventricular systolic  function was not well visualized. Left Atrium: Left atrial size was normal in size. Right Atrium: Right atrial size was normal in size. Pericardium: There is no evidence of  pericardial effusion. Presence of epicardial fat layer. Mitral Valve: The mitral valve was not well visualized. There is mild calcification of the anterior mitral valve leaflet(s). No evidence of mitral valve regurgitation. Tricuspid Valve: The tricuspid valve is grossly normal. Tricuspid valve regurgitation is trivial. No evidence of tricuspid stenosis. Aortic Valve: The aortic valve was not well visualized. Aortic valve regurgitation is not visualized. No aortic stenosis is present. Aortic valve mean gradient measures 8.0 mmHg. Aortic valve peak gradient measures 13.7 mmHg. Aortic valve area, by VTI measures 2.66 cm. Pulmonic Valve: The pulmonic valve was grossly normal. Pulmonic valve regurgitation is not visualized. No evidence of pulmonic stenosis. Aorta: Aortic dilatation noted. There is mild dilatation of the aortic root, measuring 41 mm. Venous: The inferior vena cava is normal in size with greater than 50% respiratory variability, suggesting right atrial pressure of 3 mmHg. IAS/Shunts: The interatrial septum was not well visualized.  LEFT VENTRICLE PLAX 2D LVIDd:         5.80 cm   Diastology LVIDs:         4.30 cm   LV e' medial:    4.32 cm/s LV PW:         1.40 cm   LV E/e' medial:  18.1 LV IVS:        1.50 cm   LV e' lateral:   5.26 cm/s LVOT diam:     2.30 cm   LV E/e' lateral: 14.9 LV SV:         87 LV SV Index:   33 LVOT Area:     4.15 cm  RIGHT VENTRICLE RV S prime:     16.60 cm/s LEFT ATRIUM           Index LA diam:      3.60 cm 1.38 cm/m LA Vol (A4C): 33.3 ml 12.76 ml/m  AORTIC VALVE AV Area (Vmax):    2.79 cm AV Area (Vmean):   2.63 cm AV Area (VTI):     2.66 cm AV Vmax:           185.00 cm/s AV Vmean:          133.000 cm/s AV VTI:            0.327 m AV Peak Grad:      13.7 mmHg AV Mean Grad:      8.0 mmHg LVOT Vmax:         124.33 cm/s LVOT Vmean:        84.033 cm/s LVOT VTI:          0.210 m LVOT/AV VTI ratio: 0.64  AORTA Ao Root diam: 4.10 cm Ao Asc diam:  3.30 cm MITRAL VALVE MV Area  (PHT): 3.12 cm    SHUNTS MV Decel Time: 243 msec    Systemic VTI:  0.21 m MV E velocity: 78.30 cm/s  Systemic Diam: 2.30 cm MV A velocity: 60.00 cm/s MV E/A ratio:  1.31 Eleonore Chiquito MD Electronically signed by Eleonore Chiquito MD Signature Date/Time: 01/17/2022/3:05:57 PM    Final    CT Angio Chest/Abd/Pel for Dissection W and/or Wo Contrast  Result Date: 01/16/2022 CLINICAL DATA:  history of AAA, in HF, complaining of abdominal pain, vomiting, SOB. Ulcerative colitis. EXAM: CT ANGIOGRAPHY CHEST, ABDOMEN AND PELVIS TECHNIQUE: Non-contrast CT of the chest was initially obtained. Multidetector  CT imaging through the chest, abdomen and pelvis was performed using the standard protocol during bolus administration of intravenous contrast. Multiplanar reconstructed images and MIPs were obtained and reviewed to evaluate the vascular anatomy. RADIATION DOSE REDUCTION: This exam was performed according to the departmental dose-optimization program which includes automated exposure control, adjustment of the mA and/or kV according to patient size and/or use of iterative reconstruction technique. CONTRAST:  177m OMNIPAQUE IOHEXOL 350 MG/ML SOLN COMPARISON:  None Available. FINDINGS: CTA CHEST FINDINGS Cardiovascular: Preferential opacification of the thoracic aorta. No evidence of thoracic aortic aneurysm or dissection. normal heart size. No significant pericardial effusion. Moderate to severe atherosclerotic plaque of the thoracic aorta. Four-vessel coronary artery calcifications. No central pulmonary embolus. Main pulmonary artery is mildly enlarged measuring up to 3.2 cm. Unable to evaluate more distally due to timing of contrast. Mediastinum/Nodes: No enlarged mediastinal, hilar, or axillary lymph nodes. Thyroid gland, trachea, and esophagus demonstrate no significant findings. Lungs/Pleura: Severe centrilobular and at least moderate paraseptal emphysematous changes. Heterogeneous bilateral lower lobe likely airspace  opacities. Right middle lobe peribronchovascular patchy airspace opacities. No pulmonary nodule. No pulmonary mass. No pleural effusion. No pneumothorax. Musculoskeletal: No chest wall abnormality. No suspicious lytic or blastic osseous lesions. No acute displaced fracture. Continuous bridging osteophytes and spinous process ligament calcification. At least moderate degenerative changes of the bilateral shoulders. Review of the MIP images confirms the above findings. CTA ABDOMEN AND PELVIS FINDINGS VASCULAR Aorta: Severe normal caliber aorta without aneurysm, dissection, vasculitis or significant stenosis. Celiac: Moderate atherosclerotic plaque with moderate narrowing of the origin. Patent without evidence of aneurysm, dissection, vasculitis or significant stenosis. SMA: Moderate atherosclerotic plaque with moderate narrowing of the origin. Patent without evidence of aneurysm, dissection, vasculitis or significant stenosis. Renals: Both renal arteries are patent without evidence of aneurysm, dissection, vasculitis, fibromuscular dysplasia or significant stenosis. IMA: Patent without evidence of aneurysm, dissection, vasculitis or significant stenosis. Inflow: Mild-to-moderate patent without evidence of aneurysm, dissection, vasculitis or significant stenosis. Veins: No obvious venous abnormality within the limitations of this arterial phase study. Review of the MIP images confirms the above findings. NON-VASCULAR Hepatobiliary: No focal liver abnormality. Layering calcified stones noted within the gallbladder lumen. No gallbladder wall thickening or pericholecystic fluid. No biliary dilatation. Pancreas: No focal lesion. Normal pancreatic contour. No surrounding inflammatory changes. No main pancreatic ductal dilatation. Spleen: Normal in size without focal abnormality. Adrenals/Urinary Tract: No adrenal nodule bilaterally. Bilateral kidneys enhance symmetrically. Punctate to 2 mm calcifications within the kidneys.  No ureterolithiasis. No hydronephrosis. No hydroureter. The urinary bladder is unremarkable. Stomach/Bowel: Stomach is within normal limits. No evidence of bowel wall thickening or dilatation. Loss of haustral markings along the rectosigmoid colon suggestive of ulcerative colitis. Colonic diverticulosis. Appendix appears normal. Lymphatic: No lymphadenopathy. Reproductive: Prostate is unremarkable. Other: No intraperitoneal free fluid. No intraperitoneal free gas. No organized fluid collection. Musculoskeletal: Tiny fat containing umbilical hernia. Right hip subcutaneus soft tissue edema possible hematoma formation (7:310). No suspicious lytic or blastic osseous lesions. No acute displaced fracture. Continuous bridging osteophyte formation. Partial ankylosis of the sacroiliac joints. Review of the MIP images confirms the above findings. IMPRESSION: 1. No acute thoracic or abdominal aorta abnormality. 2. 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 3. No central pulmonary embolus. Unable to evaluate more distally due to timing of contrast. 4. Bilateral lower lobe airspace opacities as well as right middle lobe peribronchovascular patchy airspace opacities consistent with pneumonia. 5. Cholelithiasis with no CT finding  of acute cholecystitis. 6. Nonobstructive bilateral nephrolithiasis. 7. Colonic diverticulosis with no acute diverticulitis. 8. Ankylosing spondylitis in a patient with ulcers colitis. 9. Right hip subcutaneus soft tissue edema possible hematoma formation. No underlying acute fracture. Electronically Signed   By: Iven Finn M.D.   On: 01/16/2022 22:48   DG Chest 1 View  Result Date: 01/16/2022 CLINICAL DATA:  [id number] SOB (shortness of breath) [id number] EXAM: CHEST  1 VIEW COMPARISON:  Chest x-ray 08/21/2021 FINDINGS: Enlarged cardiac silhouette. The heart and mediastinal contours are unchanged given change in technique.  Aortic calcification. Low lung volumes. Bibasilar airspace opacities. Increased interstitial markings. Bilateral trace to small volume, left greater than right, pleural effusions. No pneumothorax. No acute osseous abnormality. IMPRESSION: 1. Cardiomegaly with pulmonary edema. 2. Bilateral trace to small volume, left greater than right, pleural effusions. 3. Bibasilar airspace opacities may represent combination of atelectasis versus infection/inflammation. 4.  Aortic Atherosclerosis (ICD10-I70.0). Electronically Signed   By: Iven Finn M.D.   On: 01/16/2022 19:25    Cardiac Studies   TTE 01/17/2022 IMPRESSIONS  1. Left ventricular ejection fraction, by estimation, is 55 to 60%. The left ventricle has normal function. The left ventricle has no regional wall motion abnormalities. There is moderate concentric left ventricular hypertrophy. Indeterminate diastolic filling due to E-A fusion.   2. Right ventricular systolic function was not well visualized. The right ventricular size is not well visualized.   3. The mitral valve was not well visualized. No evidence of mitral valve regurgitation.   4. The aortic valve was not well visualized. Aortic valve regurgitation is not visualized. No aortic stenosis is present.   5. Aortic dilatation noted. There is mild dilatation of the aortic root, measuring 41 mm.   6. The inferior vena cava is normal in size with greater than 50% respiratory variability, suggesting right atrial pressure of 3 mmHg.  Comparison(s): No significant change from prior study.  FINDINGS   Left Ventricle: Left ventricular ejection fraction, by estimation, is 55 to 60%. The left ventricle has normal function. The left ventricle has no regional wall motion abnormalities. Definity contrast agent was given IV  to delineate the left ventricular  endocardial borders. The left ventricular internal cavity size was normal  in size. There is moderate concentric left ventricular hypertrophy.  Indeterminate diastolic filling due to E-A fusion.   Right Ventricle: The right ventricular size is not well visualized. Right vetricular wall thickness was not well visualized. Right ventricular  systolic function was not well visualized.   Left Atrium: Left atrial size was normal in size.   Right Atrium: Right atrial size was normal in size.   Pericardium: There is no evidence of pericardial effusion. Presence of epicardial fat layer.   Mitral Valve: The mitral valve was not well visualized. There is mild calcification of the anterior mitral valve leaflet(s). No evidence of mitral valve regurgitation.   Tricuspid Valve: The tricuspid valve is grossly normal. Tricuspid valve regurgitation is trivial. No evidence of tricuspid stenosis.   Aortic Valve: The aortic valve was not well visualized. Aortic valve regurgitation is not visualized. No aortic stenosis is present. Aortic valve mean gradient measures 8.0 mmHg. Aortic valve peak gradient measures  13.7 mmHg. Aortic valve area, by VTI measures 2.66 cm.   Pulmonic Valve: The pulmonic valve was grossly normal. Pulmonic valve regurgitation is not visualized. No evidence of pulmonic stenosis.   Aorta: Aortic dilatation noted. There is mild dilatation of the aortic root, measuring 41 mm.   Venous: The  inferior vena cava is normal in size with greater than 50%  respiratory variability, suggesting right atrial pressure of 3 mmHg.   IAS/Shunts: The interatrial septum was not well visualized.    Patient Profile     81 y.o. male  hx of CAD, COPD, OSA, Dementia and HFpEF who is being seen 01/17/2022 for the evaluation of SOB   Assessment & Plan    Chronic heart failure with preserved ejection fraction Bilateral pneumonia Hypertensive urgency-has resolved COPD OSA Acute proximal respiratory failure-was on BiPAP now on nasal cannula at 6 L (baseline is 4 L) Elevated troponin  Clinically he is responding to the IV Lasix.  He is  currently at a negative balance.  Will continue the Lasix at current dosing. Thankfully he has been titrated off the BiPAP he is on 6 L nasal cannula hopefully he can go back to his baseline 4 L at home. His pneumonia is being managed by the primary team. His echocardiogram done yesterday does not show any significant change from prior. Blood pressure has improved slightly but still elevated increase the amlodipine to 10 mg daily.  Hopefully by tomorrow we will be able to reinitiate his beta-blocker. Strict I's and O's, daily weights.      For questions or updates, please contact [place] Please consult [email or web address] for contact info under Cardiology/STEMI.      Signed, Berniece Salines, DO  01/18/2022, 7:50 AM

## 2022-01-18 NOTE — Progress Notes (Signed)
PCCM INTERVAL PROGRESS NOTE  Appreciate SLP eval. Passed bedside eval and was started on a diet. Went for esophagram, however, and was unable to pass barium tablet or liquid past the lower esophagus. Recommended endoscopy.  NPO with sips and chips GI consulted   Georgann Housekeeper, AGACNP-BC [place] for personal pager PCCM on call pager [phone] until 7pm. Please call Elink 7p-7a. [phone]  01/18/2022 4:27 PM

## 2022-01-18 NOTE — Evaluation (Signed)
Clinical/Bedside Swallow Evaluation Patient Details  Name: [name] MRN: [id number] Date of Birth: [birth year]  Today's Date: 01/18/2022 Time: SLP Start Time (ACUTE ONLY): 2248 SLP Stop Time (ACUTE ONLY): 2500 SLP Time Calculation (min) (ACUTE ONLY): 8 min  Past Medical History:  Past Medical History:  Diagnosis Date   Ankylosing spondylitis ([place]) 11/07/2018   Asthma    BPH (benign prostatic hyperplasia)    CAD (coronary artery disease) 01/1995   CHF (congestive heart failure) ([place])    Clostridium difficile colitis 04/2005   Colitis 2011   COPD (chronic obstructive pulmonary disease) ([name])    Depression    Diverticulosis    DJD (degenerative joint disease)    Gastric ulcer 04/17/10   Three 8m gastric ulcers, H.pylori serologies were negative   GERD (gastroesophageal reflux disease)    History of kidney stones    Hyperlipidemia    Hypertension    Idiopathic chronic inflammatory bowel disease 05/18/2010   left-sided UC   Kidney stone    [place]bid [place] 03/12/2018   Obstructive sleep apnea    on Cpap   Reflux 02/1995   S/P endoscopy 07/24/10   retained gastric contents, benign bx   Past Surgical History:  Past Surgical History:  Procedure Laterality Date   ANORECTAL MANOMETRY  2016   [facility]: concern for possible fissure. Noted pelvic floor dyssnyergy   BIOPSY  07/29/2017   Procedure: BIOPSY;  Surgeon: RDaneil Dolin MD;  Location: [facility];  Service: Endoscopy;;  ascending and sigmoid colon   CARDIAC CATHETERIZATION     with stent   CARDIOVASCULAR STRESS TEST  07/21/2009   No scintigraphic evidence of inducible myocardial ischemia   CARPAL TUNNEL RELEASE Left 01/17/2017   Procedure: LEFT CARPAL TUNNEL RELEASE;  Surgeon: HCarole Civil MD;  Location: [facility];  Service: Orthopedics;  Laterality: Left;   CATARACT EXTRACTION, BILATERAL Bilateral    CERVICAL SPINE SURGERY     C4-5   COLONOSCOPY  04/2005   granularity and friability erosions from rectum  to 40cm. Bx infection vs IBD. C. Diff positive at the time.    COLONOSCOPY  05/2010   [facility]: left-sided UC, bx with no dysplasia, shallow diverticula   COLONOSCOPY [name] 11/07/2012   RBBC:WUGQ-BVQXIproctocolitis status post segmental biopsy/Sigmoid colon polyps removed as described above. Procedure compromisd by technical difficulties. bx: Inflammation limited to sigmoid and rectum on pathology.   COLONOS[facility]  04[facility]2014   [facility] well localized proctocolitis limited to sigmoid   COLONOSCOPY WITH PROPOFOL [name] 07/29/2017   diverticulosis in colon, three 4-6 mm polyps at splenic flexure and in cecum, one 10 mm polyp in rectum, abnormal rectum and sigmoid consistent with active UC   CORONARY STENT PLACEMENT  01/1995   ESOPHAGOGASTRODUODENOSCOPY  07/24/2010   RHWT:UUEKCMesophagus   ESOPHAGOGASTRODUODENOSCOPY  04/2014   [facility]. [facility] negative small bowel biopsies   FOOT SURGERY Bilateral two   KNEE SURGERY  two   POLYPECTOMY  07/29/2017   Procedure: POLYPECTOMY;  Surgeon: RDaneil Dolin MD;  [facility];  Service: Endoscopy;;  splenic flexure, ascending colon polyp;rectal   SHOULDER SURGERY  two   TONSILLECTOMY     TRANSTHORACIC ECHOCARDIOGRAM  03/23/2009   EF 60-65%, normal LV systolic function   ULNAR HEAD EXCISION Left 01/17/2017   Procedure: LEFT ULNAR HEAD RESECTION;  Surgeon: HCarole Civil MD;  Location: [facility];  Service: Orthopedics;  Laterality: Left;   HPI:  CVICENT FEBLESis a  81 y.o. male who has a PMH including but not limited to COPD on 4L O2, OSA on CPAP, CHF. He presented to [facility] ED 12/12 with dyspnea. He had experienced intermittent non bilious emesis earlier in the week and on 12/11, he developed fever with Tmax of 101.8.  He apparently has had a steady increase in his weight over the past few weeks, unsure exactly how much.  CXR 12/14: "Bibasilar airspace disease, not significantly changed from prior,  compatible with pneumonia."  Pt seen by  outpatient SLP 12/4 with recommendations for MBSS (OP MBS scheduled for 12/20).    Assessment / Plan / Recommendation  Clinical Impression  Pt presents with functional swallowing as assessed clinically.  Pt tolerated all consistencies triald, including thin liquid by straw, with no clinical s/s of aspiration. Pt edentulous without dentures present, but with adequate oral clearance with today's trials. Pt seen 12/4 by OP SLP with hx of coughing with POs for around 6 months with clinical s/s of aspiration observed during that assessment. Recommendations at that evaluation for MBS scheduled as outpatient 12/20.  Pt and daughter describe pain and indicate sensation of stasis retrosternally.  Pt reports coughing occurs after meals when he is still for 5 minutes he starts to get a tickle.  Pt has barium esophagram ordered for this date, and transport arrived during asessment.  Recommend proceeding with barium swallow as pt's symptoms are seemingly esophageal in nature based on description from pt and daughter.  A concern for prandial aspiration still exists given current presentation with pna and clinical s/s of aspiration during OP evaluation.    Pt may resume regular texture diet with thin liquids at this time.  Will tentatively plan for MBS next date to assess pharyngeal swallow function, pending results of today's barium swallow.  SLP Visit Diagnosis: Dysphagia, unspecified (R13.10)    Aspiration Risk  Mild aspiration risk    Diet Recommendation Regular;Thin liquid   Liquid Administration via: Cup;Straw Medication Administration:  (as tolerated, no specific precautions) Supervision: Patient able to self feed Compensations: Slow rate;Small sips/bites Postural Changes: Seated upright at 90 degrees    Other  Recommendations Oral Care Recommendations: Oral care BID    Recommendations for follow up therapy are one component of a multi-disciplinary discharge planning process, led by the attending  physician.  Recommendations may be updated based on patient status, additional functional criteria and insurance authorization.  Follow up Recommendations  (TBD)      Assistance Recommended at Discharge    Functional Status Assessment  (TBD)  Frequency and Duration  (TBD)          Prognosis Prognosis for Safe Diet Advancement:  (TBD)      Swallow Study   General Date of Onset: 01/15/22 HPI: EKIN PILAR is a 81 y.o. male who has a PMH including but not limited to COPD on 4L O2, OSA on CPAP, CHF. He presented [facility] ED 12/12 with dyspnea. He had experienced intermittent non bilious emesis earlier in the week and on 12/11, he developed fever with Tmax of 101.8.  He apparently has had a steady increase in his weight over the past few weeks, unsure exactly how much.  CXR 12/14: "Bibasilar airspace disease, not significantly changed from prior,  compatible with pneumonia."  Pt seen by outpatient SLP 12/4 with recommendations for MBSS (OP MBS scheduled for 12/20). Type of Study: Bedside Swallow Evaluation Previous Swallow Assessment: OP CSE 12/4 Diet Prior to this Study: NPO Respiratory Status: Nasal cannula  History of Recent Intubation: No Behavior/Cognition: Alert;Cooperative;Pleasant mood Oral Cavity Assessment: Within Functional Limits Oral Care Completed by SLP: No Oral Cavity - Dentition: Edentulous Vision: Functional for self-feeding Self-Feeding Abilities: Able to feed self Patient Positioning: Upright in bed Baseline Vocal Quality: Normal Volitional Cough:  (Fair) Volitional Swallow: Able to elicit    Oral/Motor/Sensory Function Overall Oral Motor/Sensory Function: Within functional limits Facial ROM: Within Functional Limits Facial Symmetry: Within Functional Limits Lingual ROM: Within Functional Limits Lingual Symmetry: Within Functional Limits Lingual Strength: Within Functional Limits Velum: Within Functional Limits Mandible: Within Functional Limits   Ice Chips  Ice chips: Not tested   Thin Liquid Thin Liquid: Within functional limits Presentation: Straw    Nectar Thick Nectar Thick Liquid: Not tested   Honey Thick Honey Thick Liquid: Not tested   Puree Puree: Within functional limits Presentation: Spoon   Solid     Solid: Within functional limits Presentation: [place], [place], [place] Office: [phone] 01/18/2022,12:31 PM

## 2022-01-19 DIAGNOSIS — I5033 Acute on chronic diastolic (congestive) heart failure: Secondary | ICD-10-CM

## 2022-01-19 DIAGNOSIS — N179 Acute kidney failure, unspecified: Secondary | ICD-10-CM

## 2022-01-19 DIAGNOSIS — J449 Chronic obstructive pulmonary disease, unspecified: Secondary | ICD-10-CM

## 2022-01-19 DIAGNOSIS — I2510 Atherosclerotic heart disease of native coronary artery without angina pectoris: Secondary | ICD-10-CM

## 2022-01-19 DIAGNOSIS — E876 Hypokalemia: Secondary | ICD-10-CM | POA: Insufficient documentation

## 2022-01-19 DIAGNOSIS — I169 Hypertensive crisis, unspecified: Secondary | ICD-10-CM | POA: Diagnosis not present

## 2022-01-19 DIAGNOSIS — J189 Pneumonia, unspecified organism: Secondary | ICD-10-CM | POA: Diagnosis not present

## 2022-01-19 DIAGNOSIS — J9621 Acute and chronic respiratory failure with hypoxia: Secondary | ICD-10-CM | POA: Diagnosis not present

## 2022-01-19 DIAGNOSIS — G4733 Obstructive sleep apnea (adult) (pediatric): Secondary | ICD-10-CM | POA: Diagnosis not present

## 2022-01-19 DIAGNOSIS — R1319 Other dysphagia: Secondary | ICD-10-CM

## 2022-01-19 DIAGNOSIS — N1831 Chronic kidney disease, stage 3a: Secondary | ICD-10-CM | POA: Insufficient documentation

## 2022-01-19 DIAGNOSIS — I5A Non-ischemic myocardial injury (non-traumatic): Secondary | ICD-10-CM | POA: Insufficient documentation

## 2022-01-19 DIAGNOSIS — I509 Heart failure, unspecified: Secondary | ICD-10-CM | POA: Diagnosis not present

## 2022-01-19 DIAGNOSIS — Z9861 Coronary angioplasty status: Secondary | ICD-10-CM

## 2022-01-19 DIAGNOSIS — J810 Acute pulmonary edema: Secondary | ICD-10-CM | POA: Diagnosis not present

## 2022-01-19 LAB — CBC
HCT: 41.4 % (ref 39.0–52.0)
Hemoglobin: 13.5 g/dL (ref 13.0–17.0)
MCH: 30.1 pg (ref 26.0–34.0)
MCHC: 32.6 g/dL (ref 30.0–36.0)
MCV: 92.4 fL (ref 80.0–100.0)
Platelets: 262 10*3/uL (ref 150–400)
RBC: 4.48 MIL/uL (ref 4.22–5.81)
RDW: 13.7 % (ref 11.5–15.5)
WBC: 10.7 10*3/uL — ABNORMAL HIGH (ref 4.0–10.5)
nRBC: 0 % (ref 0.0–0.2)

## 2022-01-19 LAB — BASIC METABOLIC PANEL
Anion gap: 12 (ref 5–15)
BUN: 22 mg/dL (ref 8–23)
CO2: 28 mmol/L (ref 22–32)
Calcium: 9.9 mg/dL (ref 8.9–10.3)
Chloride: 100 mmol/L (ref 98–111)
Creatinine, Ser: 1.4 mg/dL — ABNORMAL HIGH (ref 0.61–1.24)
GFR, Estimated: 50 mL/min — ABNORMAL LOW (ref >60)
Glucose, Bld: 123 mg/dL — ABNORMAL HIGH (ref 70–99)
Potassium: 3.4 mmol/L — ABNORMAL LOW (ref 3.5–5.1)
Sodium: 140 mmol/L (ref 135–145)

## 2022-01-19 MED ORDER — METOPROLOL TARTRATE 25 MG PO TABS
25.0000 mg | ORAL_TABLET | Freq: Two times a day (BID) | ORAL | Status: DC
  Administered 2022-01-20 – 2022-01-22 (×4): 25 mg via ORAL
  Filled 2022-01-19 (×6): qty 1

## 2022-01-19 MED ORDER — POTASSIUM CHLORIDE 10 MEQ/100ML IV SOLN
10.0000 meq | INTRAVENOUS | Status: AC
  Administered 2022-01-19 (×4): 10 meq via INTRAVENOUS
  Filled 2022-01-19 (×4): qty 100

## 2022-01-19 MED ORDER — SODIUM CHLORIDE 0.9 % IV SOLN: INTRAVENOUS | Status: DC

## 2022-01-19 NOTE — Assessment & Plan Note (Signed)
BP sligthly elevated - Continue amlodipine, furoemide, hImdur, metoprolol, PRN hydralazine

## 2022-01-19 NOTE — Assessment & Plan Note (Addendum)
He does not have a frank COPD exacerbation, but seems to be improving with addition of Pulmicort and scheduled bronchodilators - Continue new Pulmicort - Continue scheduled and PRN albuterol  - Aggressive pulmonary toilet

## 2022-01-19 NOTE — Assessment & Plan Note (Signed)
See above

## 2022-01-19 NOTE — Assessment & Plan Note (Addendum)
Resolved with supplementation and starting spironolactone.

## 2022-01-19 NOTE — Consult Note (Signed)
Referring Provider: Pulmonary  Primary Care Physician:  [name], MD Primary Gastroenterologist:  Dr. Therisa Doyne  Reason for Consultation: Dysphagia, abnormal barium swallow  HPI: [name] is a 81 y.o. male with past medical history of coronary artery disease, COPD, dementia, CHF presented to the hospital few days ago with shortness of breath.  Looks like he started having intermittent nausea and vomiting earlier this week and subsequently developed fever.  Was diagnosed with pneumonia and eventually respiratory failure. Patient was complaining of coughing and choking sensation while eating underwent barium swallow yesterday which showed persistent narrowing at GE junction and 13 mm barium tablet failed to pass.  GI is consulted for further evaluation.  Patient seen and examined at bedside.  Patient's daughter at bedside.  Patient has ongoing shortness of breath which has improved since admission.  Currently on 5 L of oxygen.  He is more concerned about coughing sensation while eating rather than food getting stuck in esophagus.  Intermittent loose stools.  Denies any blood in the stool.  Denies unintentional weight loss.   Patient with history of ulcerative colitis involving rectosigmoid area.  Last colonoscopy in 2019.  Looks like he was well-maintained on mesalamine.  Was last seen in office in 2022 for GI bleed but subsequently no-show for labs .   Past Medical History:  Diagnosis Date   Ankylosing spondylitis ([place]) 11/07/2018   Asthma    BPH (benign prostatic hyperplasia)    CAD (coronary artery disease) 01/1995   CHF (congestive heart failure) ([name])    Clostridium difficile colitis 04/2005   Colitis 2011   COPD (chronic obstructive pulmonary disease) ([name])    Depression    Diverticulosis    DJD (degenerative joint disease)    Gastric ulcer 04/17/10   Three 38m gastric ulcers, H.pylori serologies were negative   GERD (gastroesophageal reflux disease)    History of kidney  stones    Hyperlipidemia    Hypertension    Idiopathic chronic inflammatory bowel disease 05/18/2010   left-sided UC   Kidney stone    Morbid obesity ([facility] 03/12/2018   Obstructive sleep apnea    on Cpap   Reflux 02/1995   S/P endoscopy 07/24/10   retained gastric contents, benign bx    Past Surgical History:  Procedure Laterality Date   ANORECTAL MANOMETRY  2016   [facility]: concern for possible fissure. Noted pelvic floor dyssnyergy   BIOPSY  07/29/2017   Procedure: BIOPSY;  Surgeon: RDaneil Dolin MD;  Location: [facility];  Service: Endoscopy;;  ascending and sigmoid colon   CARDIAC CATHETERIZATION     with stent   CARDIOVASCULAR STRESS TEST  07/21/2009   No scintigraphic evidence of inducible myocardial ischemia   CARPAL TUNNEL RELEASE Left 01/17/2017   Procedure: LEFT CARPAL TUNNEL RELEASE;  Surgeon: HCarole Civil MD;  Location: [facility];  Service: Orthopedics;  Laterality: Left;   CATARACT EXTRACTION, BILATERAL Bilateral    CERVICAL SPINE SURGERY     C4-5   COLONOSCOPY  04/2005   granularity and friability erosions from rectum to 40cm. Bx infection vs IBD. C. Diff positive at the time.    COLONOSCOPY  05/2010   [facility]: left-sided UC, bx with no dysplasia, shallow diverticula   COLONOSCOPY [name] 11/07/2012   RUXN:ATFT-DDUKGproctocolitis status post segmental biopsy/Sigmoid colon polyps removed as described above. Procedure compromisd by technical difficulties. bx: Inflammation limited to sigmoid and rectum on pathology.   [facility]  localized proctocolitis limited to sigmoid   COLONOSCOPY WITH PROPOFOL [name] 07/29/2017   diverticulosis in colon, three 4-6 mm polyps at splenic flexure and in cecum, one 10 mm polyp in rectum, abnormal rectum and sigmoid consistent with active UC   CORONARY STENT PLACEMENT  01/1995   ESOPHAGOGASTRODUODENOSCOPY  07/24/2010   YWV:PXTGGY esophagus   ESOPHAGOGASTRODUODENOSCOPY  04/2014   Dr. Nyoka Cowden at [facility]:  negative small bowel biopsies   FOOT SURGERY Bilateral two   KNEE SURGERY  two   POLYPECTOMY  07/29/2017   Procedure: POLYPECTOMY;  Surgeon: Daneil Dolin, MD;  Location: [facility];  Service: Endoscopy;;  splenic flexure, ascending colon polyp;rectal   SHOULDER SURGERY  two   TONSILLECTOMY     TRANSTHORACIC ECHOCARDIOGRAM  03/23/2009   EF 60-65%, normal LV systolic function   ULNAR HEAD EXCISION Left 01/17/2017   Procedure: LEFT ULNAR HEAD RESECTION;  Surgeon: Carole Civil, MD;  Location: [facility];  Service: Orthopedics;  Laterality: Left;    Prior to Admission medications   Medication Sig Start Date End Date Taking? Authorizing Provider  acetaminophen (TYLENOL) 500 MG tablet Take 2 tablets (1,000 mg total) by mouth every 6 (six) hours as needed for headache (pain). 11/24/20  Yes Luking, Elayne Snare, MD  albuterol (VENTOLIN HFA) 108 (90 Base) MCG/ACT inhaler Inhale 2 puffs into the lungs every 6 (six) hours as needed for wheezing or shortness of breath. 02/20/21  Yes [name], MD  aspirin EC 81 MG tablet Take 81 mg by mouth daily. Swallow whole.   Yes [provider]  celecoxib (CELEBREX) 100 MG capsule Take 100 mg by mouth every morning. 04/05/20  Yes [provider]  DULoxetine (CYMBALTA) 60 MG capsule TAKE 1 CAPSULE BY MOUTH DAILY Patient taking differently: Take 60 mg by mouth daily. 01/04/22  Yes Luking, Elayne Snare, MD  furosemide (LASIX) 40 MG tablet ONE TO ONE AND A HALF TABLET EACH MORNING AS NEEDED Patient taking differently: Take 40 mg by mouth every morning. 06/23/20  Yes Luking, Elayne Snare, MD  ketoconazole (NIZORAL) 2 % cream APPLY TOPICALLY TWICE DAILY. [place] WITH BARRIER CREAM SUCH AS [place] Patient taking differently: Apply 1 Application topically daily as needed for irritation. MIX WITH BARRIER CREAM SUCH AS DESITIN 07/11/21  Yes [name], MD  metoprolol tartrate (LOPRESSOR) 50 MG tablet TAKE 1/2 TABLET(25 MG) BY MOUTH EVERY EVENING Patient taking  differently: Take 25 mg by mouth in the morning. 09/11/21  Yes [name], MD  mupirocin ointment (BACTROBAN) 2 % Apply 1 Application topically 2 (two) times daily as needed (wound care/raw areas). 09/20/21  Yes Luking, Elayne Snare, MD  nitroGLYCERIN (NITROSTAT) 0.4 MG SL tablet ONE TABLET UNDER TONGUE AS NEEDED FOR CHEST PAIN EVERY 5 MINUTES AS DIRECTED Patient taking differently: Place 0.4 mg under the tongue every 5 (five) minutes as needed for chest pain. 10/14/20  Yes Lorretta Harp, MD  nystatin cream (MYCOSTATIN) Apply 1 application topically 2 (two) times daily as needed (rash/itching). 12/28/20  Yes Luking, Elayne Snare, MD  pravastatin (PRAVACHOL) 40 MG tablet TAKE 1 TABLET(40 MG) BY MOUTH DAILY Patient taking differently: Take 40 mg by mouth daily. 12/06/21  Yes [name], MD  pyridOXINE (VITAMIN B-6) 100 MG tablet Take 100 mg by mouth daily.   Yes [provider]  triamcinolone cream (KENALOG) 0.1 % Apply 1 Application topically 2 (two) times daily. Patient taking differently: Apply 1 Application topically daily as needed (for itching). 09/20/21  Yes  [name], MD  vitamin C (ASCORBIC ACID) 500 MG tablet Take 500 mg by mouth daily.   Yes [provider]  Vitamin D, Ergocalciferol, (DRISDOL) 1.25 MG (50000 UNIT) CAPS capsule TAKE 1 CAPSULE BY MOUTH EVERY 7 DAYS Patient taking differently: Take 50,000 Units by mouth every 7 (seven) days. Monday 01/02/21  Yes [name], MD    Scheduled Meds:  amLODipine  10 mg Oral Daily   aspirin  81 mg Oral Daily   Chlorhexidine Gluconate Cloth  6 each Topical Daily   DULoxetine  60 mg Oral Daily   furosemide  60 mg Intravenous Q12H   heparin  5,000 Units Subcutaneous Q8H   isosorbide mononitrate  60 mg Oral Daily   metoprolol tartrate  25 mg Oral Q12H   pravastatin  40 mg Oral Daily   Continuous Infusions:  cefTRIAXone (ROCEPHIN)  IV 2 g (01/18/22 2007)   potassium chloride 10 mEq (01/19/22 0934)   PRN  Meds:.acetaminophen **OR** acetaminophen, albuterol, docusate sodium, hydrALAZINE, polyethylene glycol  Allergies as of 01/16/2022   (No Known Allergies)    Family History  Problem Relation Age of Onset   Lung cancer Mother    Cancer Mother        breast   Diabetes Mother    Stroke Father    Hypertension Father    Kidney failure Brother    Other Child        blood infection   Colon cancer Neg Hx     Social History   Socioeconomic History   Marital status: Widowed    Spouse name: Not on file   Number of children: Not on file   Years of education: Not on file   Highest education level: Not on file  Occupational History   Occupation: maintenance    Comment: retired  Tobacco Use   Smoking status: Former    Packs/day: 1.00    Years: 20.00    Total pack years: 20.00    Types: Cigarettes    Quit date: 09/30/1969    Years since quitting: 52.3   Smokeless tobacco: Never  Vaping Use   Vaping Use: Never used  Substance and Sexual Activity   Alcohol use: No   Drug use: No   Sexual activity: Yes    Partners: Female    Birth control/protection: None    Comment: spouse  Other Topics Concern   Not on file  Social History Narrative   Not on file   Social Determinants of Health   Financial Resource Strain: Low Risk  (01/31/2021)   Overall Financial Resource Strain (CARDIA)    Difficulty of Paying Living Expenses: Not hard at all  Food Insecurity: No Food Insecurity (01/18/2022)   Hunger Vital Sign    Worried About Running Out of Food in the Last Year: Never true    [place] in the Last Year: Never true  Transportation Needs: No Transportation Needs (01/18/2022)   PRAPARE - Hydrologist (Medical): No    Lack of Transportation (Non-Medical): No  Physical Activity: Inactive (01/31/2021)   Exercise Vital Sign    Days of Exercise per Week: 0 days    Minutes of Exercise per Session: 0 min  Stress: Stress Concern Present (01/31/2021)    [place]    Feeling of Stress : To some extent  Social Connections: Moderately Integrated (01/31/2021)   Social Connection and Isolation Panel [NHANES]  Frequency of Communication with Friends and Family: More than three times a week    Frequency of Social Gatherings with Friends and Family: More than three times a week    Attends Religious Services: 1 to 4 times per year    Active Member of [employer] or Organizations: Yes    Attends Archivist Meetings: 1 to 4 times per year    Marital Status: Widowed  Intimate Partner Violence: Not At Risk (01/18/2022)   Humiliation, Afraid, Rape, and Kick questionnaire    Fear of Current or Ex-Partner: No    Emotionally Abused: No    Physically Abused: No    Sexually Abused: No    Review of Systems: All negative except as stated above in HPI.  Physical Exam: Vital signs: Vitals:   01/19/22 0406 01/19/22 0740  BP: (!) 154/64 (!) 144/75  Pulse: 93 93  Resp: (!) 23 (!) 24  Temp: 98.9 F (37.2 C) 100 F (37.8 C)  SpO2: 92% 91%   Last BM Date : 01/18/22 General:  obese, sitting in recliner, oxygen by nasal cannula Lungs: Decreased air entry bilaterally, anterior exam only Heart:  Regular rate and rhythm; no murmurs, clicks, rubs,  or gallops. Abdomen: Mildly distended, soft, bowel sounds present, no peritoneal signs Neuro -alert and oriented x 3 Psych -mood and affect normal Rectal:  Deferred  GI:  Lab Results: Recent Labs    01/17/22 0231 01/18/22 0426 01/19/22 0419  WBC 18.3* 12.7* 10.7*  HGB 13.3 12.9* 13.5  HCT 41.0 39.8 41.4  PLT 217 228 262   BMET Recent Labs    01/17/22 0231 01/18/22 0426 01/19/22 0419  NA 139 140 140  K 3.4* 3.4* 3.4*  CL 104 103 100  CO2 23 27 28   GLUCOSE 163* 118* 123*  BUN 19 22 22   CREATININE 1.51* 1.50* 1.40*  CALCIUM 9.3 9.7 9.9   LFT Recent Labs    01/16/22 1831  PROT 7.3  ALBUMIN 3.1*  AST 43*  ALT 38   ALKPHOS 88  BILITOT 0.9   PT/INR No results for input(s): "LABPROT", "INR" in the last 72 hours.   Studies/Results: DG ESOPHAGUS W SINGLE CM (SOL OR THIN BA)  Result Date: 01/18/2022 CLINICAL DATA:  Complaint of esophageal dysphagia with sensation of stasis retrosternally after eating. Request for esophagram after speech therapy recommendation. EXAM: ESOPHAGUS/BARIUM SWALLOW/TABLET STUDY TECHNIQUE: Single contrast examination was performed using thin liquid barium. Limited study due to patient's inability to turn head or move on the table into different positions. This exam was performed by Ascencion Dike PA-C , and was supervised and interpreted by Dr. Suzy Bouchard. FLUOROSCOPY: Radiation Exposure Index (as provided by the fluoroscopic device): 56.20 mGy Kerma COMPARISON:  None Available. FINDINGS: Swallowing: Appears normal. No vestibular penetration or aspiration seen. Lateral view assessment could not be performed. Pharynx: Unremarkable. Esophagus: No definitive mucosal lesions or masses. Persistent narrowing of the distal esophagus just above the GE junction. 13 mm barium tablet failed to pass the distal esophageal narrowing. Tablet would not move further despite additional thin barium and water. Barium and water do pass freely around the tablet. Esophageal motility: Limited evaluation of motility as patient could not lay in the prone position. Evidence of dysmotility present, likely presbyesophagus, resulting in some contrast stasis within the esophagus. Hiatal Hernia: None. Gastroesophageal reflux: None visualized. Other: None. IMPRESSION: 1. Persistent narrowing / stenosis of the distal esophagus with failure of the barium tab to pass. Recommend endoscopy to evaluate for benign or  malignant stenosis of the distal esophagus. 2. No mucosal irregularity identified in esophagus. 3. Mild esophageal dysmotility favored presbyesophagus. Electronically Signed   By: Suzy Bouchard M.D.   On:  01/18/2022 14:09   DG Chest Port 1 View  Result Date: 01/18/2022 CLINICAL DATA:  Respiratory failure EXAM: PORTABLE CHEST 1 VIEW COMPARISON:  Radiograph 01/16/2022 FINDINGS: Unchanged enlarged cardiac silhouette. Bibasilar consolidations, similar to prior. Mildly increased interstitial opacities. No large pleural effusion or evidence of pneumothorax. Bones are unchanged. IMPRESSION: Bibasilar airspace disease, not significantly changed from prior, compatible with pneumonia. Mildly increased interstitial opacities could reflect a degree of interstitial edema. Electronically Signed   By: Maurine Simmering M.D.   On: 01/18/2022 08:38   ECHOCARDIOGRAM COMPLETE  Result Date: 01/17/2022    ECHOCARDIOGRAM REPORT   Patient Name:   [name] Date of Exam: 01/17/2022 Medical Rec #:  [id number]         Height:       73.0 in Accession #:    [id number]        Weight:       315.0 lb Date of Birth:  Oct 22, 1940         BSA:          2.610 m Patient Age:    62 years          BP:           134/61 mmHg Patient Gender: M                 HR:           79 bpm. Exam Location:  Inpatient Procedure: 2D Echo, Cardiac Doppler, Color Doppler and Intracardiac            Opacification Agent Indications:    Elevated Troponin  History:        Patient has prior history of Echocardiogram examinations, most                 recent 09/29/2019. CHF, CAD, COPD, Signs/Symptoms:Dyspnea, Chest                 Pain, Shortness of Breath and Syncope; Risk                 Factors:Hypertension, Sleep Apnea and Dyslipidemia.  Sonographer:    Ronny Flurry Referring Phys: [id number] RAHUL P DESAI IMPRESSIONS  1. Left ventricular ejection fraction, by estimation, is 55 to 60%. The left ventricle has normal function. The left ventricle has no regional wall motion abnormalities. There is moderate concentric left ventricular hypertrophy. Indeterminate diastolic filling due to E-A fusion.  2. Right ventricular systolic function was not well visualized. The  right ventricular size is not well visualized.  3. The mitral valve was not well visualized. No evidence of mitral valve regurgitation.  4. The aortic valve was not well visualized. Aortic valve regurgitation is not visualized. No aortic stenosis is present.  5. Aortic dilatation noted. There is mild dilatation of the aortic root, measuring 41 mm.  6. The inferior vena cava is normal in size with greater than 50% respiratory variability, suggesting right atrial pressure of 3 mmHg. Comparison(s): No significant change from prior study. FINDINGS  Left Ventricle: Left ventricular ejection fraction, by estimation, is 55 to 60%. The left ventricle has normal function. The left ventricle has no regional wall motion abnormalities. Definity contrast agent was given IV to delineate the left ventricular  endocardial borders. The left ventricular internal cavity size was normal in  size. There is moderate concentric left ventricular hypertrophy. Indeterminate diastolic filling due to E-A fusion. Right Ventricle: The right ventricular size is not well visualized. Right vetricular wall thickness was not well visualized. Right ventricular systolic function was not well visualized. Left Atrium: Left atrial size was normal in size. Right Atrium: Right atrial size was normal in size. Pericardium: There is no evidence of pericardial effusion. Presence of epicardial fat layer. Mitral Valve: The mitral valve was not well visualized. There is mild calcification of the anterior mitral valve leaflet(s). No evidence of mitral valve regurgitation. Tricuspid Valve: The tricuspid valve is grossly normal. Tricuspid valve regurgitation is trivial. No evidence of tricuspid stenosis. Aortic Valve: The aortic valve was not well visualized. Aortic valve regurgitation is not visualized. No aortic stenosis is present. Aortic valve mean gradient measures 8.0 mmHg. Aortic valve peak gradient measures 13.7 mmHg. Aortic valve area, by VTI measures 2.66  cm. Pulmonic Valve: The pulmonic valve was grossly normal. Pulmonic valve regurgitation is not visualized. No evidence of pulmonic stenosis. Aorta: Aortic dilatation noted. There is mild dilatation of the aortic root, measuring 41 mm. Venous: The inferior vena cava is normal in size with greater than 50% respiratory variability, suggesting right atrial pressure of 3 mmHg. IAS/Shunts: The interatrial septum was not well visualized.  LEFT VENTRICLE PLAX 2D LVIDd:         5.80 cm   Diastology LVIDs:         4.30 cm   LV e' medial:    4.32 cm/s LV PW:         1.40 cm   LV E/e' medial:  18.1 LV IVS:        1.50 cm   LV e' lateral:   5.26 cm/s LVOT diam:     2.30 cm   LV E/e' lateral: 14.9 LV SV:         87 LV SV Index:   33 LVOT Area:     4.15 cm  RIGHT VENTRICLE RV S prime:     16.60 cm/s LEFT ATRIUM           Index LA diam:      3.60 cm 1.38 cm/m LA Vol (A4C): 33.3 ml 12.76 ml/m  AORTIC VALVE AV Area (Vmax):    2.79 cm AV Area (Vmean):   2.63 cm AV Area (VTI):     2.66 cm AV Vmax:           185.00 cm/s AV Vmean:          133.000 cm/s AV VTI:            0.327 m AV Peak Grad:      13.7 mmHg AV Mean Grad:      8.0 mmHg LVOT Vmax:         124.33 cm/s LVOT Vmean:        84.033 cm/s LVOT VTI:          0.210 m LVOT/AV VTI ratio: 0.64  AORTA Ao Root diam: 4.10 cm Ao Asc diam:  3.30 cm MITRAL VALVE MV Area (PHT): 3.12 cm    SHUNTS MV Decel Time: 243 msec    Systemic VTI:  0.21 m MV E velocity: 78.30 cm/s  Systemic Diam: 2.30 cm MV A velocity: 60.00 cm/s MV E/A ratio:  1.31 Eleonore Chiquito MD Electronically signed by Eleonore Chiquito MD Signature Date/Time: 01/17/2022/3:05:57 PM    Final     Impression/Plan: -Dysphagia with abnormal barium swallow concerning for GE junction  stricture. -Dysphagia.  Appears to be combined oropharyngeal and esophageal -Hypoxic respiratory failure.  Currently on 5 L of oxygen. -Multiple other comorbidities  Recommendations --------------------------- -Need for EGD discussed with the  family at bedside.  Family was not sure of having EGD done today.  They will discuss with other family members later today and they wanted me to tentatively put patient for EGD with possible dilation tomorrow. -Okay to have full liquid diet today.  Keep n.p.o. past midnight. -Discussed with hospitalist  Risks (bleeding, infection, bowel perforation that could require surgery, sedation-related changes in cardiopulmonary systems), benefits (identification and possible treatment of source of symptoms, exclusion of certain causes of symptoms), and alternatives (watchful waiting, radiographic imaging studies, empiric medical treatment)  were explained to patient/family in detail and patient wishes to proceed.     LOS: 2 days   Otis Brace  MD, FACP 01/19/2022, 9:36 AM  Contact #  [phone]

## 2022-01-19 NOTE — Progress Notes (Signed)
  Progress Note   Patient: [name] URK:[id number] DOB: 01/20/1941 DOA: 01/16/2022     2 DOS: the patient was seen and examined on 01/19/2022 at 10:15AM      Brief hospital course: [name] is an 81 y.o. M with COPD and dCHF and morbid obesity BMI 40.4 with chronic respiratory failure on 4L home O2, HTN, OSA on BiPAP, HLD and CAD s/p PCI >61yr ulcerative colitis not on therapy who presented with dyspnea, fever and lower extremity swelling.  CTA in the ER showed bilateral lower lobe and RML airspace opacities. In respiratory distress requiring BiPAP on admission to ICU with antibiotics/diuretics.    12/13: Admitted on antibiotics, diuretics 12/14: Urine strep antigen positive; barium esophagram shows narrowing, GI consulted      Assessment and Plan: * Acute on chronic hypoxic respiratory failure ([name]) Still on 5L, more than his baseline, still orthopneic. - Defer endoscopy for today - Continue diuresis    Acute on chronic diastolic CHF (congestive heart failure) ([name] Still orthopneic. Net negative 1.9L yesterday, documented weights inaccurate. - Continue IV Lasix - Continue Potassium - Strict I/Os, daily weights, daily BMP - Consult Cardiology  Community acquired pneumonia of bilateral lower lobes and right middle lobe due to Streptoccocus pneumoniae - Continue Rocephin - Flutter and IS  Hypertensive crisis BP sligthly elevated - Continue amlodipine, furoemide, hImdur, metoprolol, PRN hydralazine  Esophageal dysphagia Barium esophagram showed: Persistent narrowing / stenosis of the distal esophagus with failure of the barium tab to pass  - Consult GI, plan for EGD, when respiratory status is beter  Hypokalemia - Supplement K  Myocardial injury Due to CHF, pneumonia.  No ischemia.  AKI (acute kidney injury) ([name] Baseline 1.1.  Cr here up to 1.5  Depression, major, single episode, moderate ([name]) - Continue Cymbalta  Chronic respiratory failure  with hypoxia ([name]) - Continue supplemental O2  COPD (chronic obstructive pulmonary disease) ([name]) - Continue albuterol  Morbid obesity ([name]) BMI 40.4  Dyslipidemia, goal LDL below 70 - Continue pravastatin  Essential hypertension See above  CAD S/P percutaneous coronary angioplasty - Continue aspirin, pravsattin, BB  OSA treated with BiPAP - Continue BiPAP at night          Subjective: Still orthopneic, still coughing, still out of breath.  No confusion, no fever.       Physical Exam: BP (!) 141/67 (BP Location: Right Wrist)   Pulse 84   Temp 100.1 F (37.8 C) (Axillary)   Resp (!) 29   Ht 6' 1"  (1.854 m)   Wt (!) 139.2 kg   SpO2 (!) 89%   BMI 40.49 kg/m   Obese adult male, sitting up in recliner, appears tired RRR, no murmurs, some trace lower extremity edema Respiratory rate seems increased, diminished bilaterally, coarse breath sounds, no wheezing Abdomen soft no tenderness palpation Attention normal, affect appropriate, judgment insight appear normal, severe generalized weakness, face symmetric    Data Reviewed: Discussed with gastroenterology CBC shows white blood cell count improved, hemoglobin normal Echocardiogram no change from his previous Patient metabolic panel shows creatinine stable at 1.4, potassium 3.4    Family Communication: Daughter at the bedside, other daughter and POA by phone    Disposition: Status is: Inpatient         Author: CEdwin Dada MD 01/19/2022 4:43 PM  For on call review [email or web address]

## 2022-01-19 NOTE — Assessment & Plan Note (Signed)
-   Continue aspirin, pravsattin, BB

## 2022-01-19 NOTE — Assessment & Plan Note (Signed)
-   Continue Cymbalta

## 2022-01-19 NOTE — Assessment & Plan Note (Signed)
BMI 40.4

## 2022-01-19 NOTE — Assessment & Plan Note (Signed)
-   Continue supplemental O2

## 2022-01-19 NOTE — Progress Notes (Addendum)
SLP Cancellation Note  Patient Details Name: [name] MRN: [id number] DOB: [birth year]   Cancelled treatment:        Barium esophagram with findings of "Persistent narrowing / stenosis of the distal esophagus with failure of the barium tab to pass. Recommend endoscopy to evaluate for benign or malignant stenosis of the distal esophagus." Pt now NPO with GI consult placed.  SLP will hold further evaluation of pharyngeal swallow function pending resolution of stasis in distal esophagus.   Celedonio Savage, [place], [place] Office: [phone] 01/19/2022, 8:57 AM

## 2022-01-19 NOTE — Assessment & Plan Note (Signed)
-   Continue BiPAP at night 

## 2022-01-19 NOTE — Progress Notes (Signed)
Progress Note  Patient Name: [name] Date of Encounter: 01/19/2022  Primary Cardiologist: Quay Burow, MD   Subjective   Patient seen and examined at his bedside.  Family members by the bedside during time of my arrival.  Net balance for last 24 hours -1929m.  Inpatient Medications    Scheduled Meds:  amLODipine  10 mg Oral Daily   aspirin  81 mg Oral Daily   Chlorhexidine Gluconate Cloth  6 each Topical Daily   DULoxetine  60 mg Oral Daily   furosemide  60 mg Intravenous Q12H   heparin  5,000 Units Subcutaneous Q8H   isosorbide mononitrate  60 mg Oral Daily   pravastatin  40 mg Oral Daily   Continuous Infusions:  cefTRIAXone (ROCEPHIN)  IV 2 g (01/18/22 2007)   PRN Meds: acetaminophen **OR** acetaminophen, albuterol, docusate sodium, hydrALAZINE, polyethylene glycol   Vital Signs    Vitals:   01/18/22 1839 01/18/22 2012 01/19/22 0406 01/19/22 0434  BP: (!) 144/74 (!) 165/63 (!) 154/64   Pulse: 93 98 93   Resp: 20 (!) 25 (!) 23   Temp: 98.6 F (37 C) 98.7 F (37.1 C) 98.9 F (37.2 C)   TempSrc: Oral Axillary Axillary   SpO2: 90% 98% 92%   Weight:    (!) 139.2 kg  Height:        Intake/Output Summary (Last 24 hours) at 01/19/2022 0738 Last data filed at 01/19/2022 0404 Gross per 24 hour  Intake 510.2 ml  Output 2470 ml  Net -1959.8 ml   Filed Weights   01/16/22 2309 01/18/22 0500 01/19/22 0434  Weight: (!) 142.9 kg (!) 138.8 kg (!) 139.2 kg    Telemetry    Sinus rhythm - Personally Reviewed  ECG    None today- Personally Reviewed  Physical Exam    General: Comfortable, with nasal cannula Head: Atraumatic, normal size  Eyes: PEERLA, EOMI  Neck: Supple, normal JVD Cardiac: Normal S1, S2; RRR; no murmurs, rubs, or gallops Lungs: Clear to auscultation bilaterally Abd: Soft, nontender, no hepatomegaly  Ext: warm, no edema Musculoskeletal: No deformities, BUE and BLE strength normal and equal Skin: Warm and dry, no rashes    Neuro: Alert and oriented to person, place, time, and situation, CNII-XII grossly intact, no focal deficits  Psych: Normal mood and affect   Labs    Chemistry Recent Labs  Lab 01/16/22 1831 01/16/22 1858 01/17/22 0231 01/18/22 0426 01/19/22 0419  NA 140   < > 139 140 140  K 3.7   < > 3.4* 3.4* 3.4*  CL 105  --  104 103 100  CO2 23  --  23 27 28   GLUCOSE 126*  --  163* 118* 123*  BUN 20  --  19 22 22   CREATININE 1.40*  --  1.51* 1.50* 1.40*  CALCIUM 10.0  --  9.3 9.7 9.9  PROT 7.3  --   --   --   --   ALBUMIN 3.1*  --   --   --   --   AST 43*  --   --   --   --   ALT 38  --   --   --   --   ALKPHOS 88  --   --   --   --   BILITOT 0.9  --   --   --   --   GFRNONAA 50*  --  46* 46* 50*  ANIONGAP 12  --  12  10 12   < > = values in this interval not displayed.     Hematology Recent Labs  Lab 01/17/22 0231 01/18/22 0426 01/19/22 0419  WBC 18.3* 12.7* 10.7*  RBC 4.40 4.24 4.48  HGB 13.3 12.9* 13.5  HCT 41.0 39.8 41.4  MCV 93.2 93.9 92.4  MCH 30.2 30.4 30.1  MCHC 32.4 32.4 32.6  RDW 14.2 14.1 13.7  PLT 217 228 262    Cardiac EnzymesNo results for input(s): "TROPONINI" in the last 168 hours. No results for input(s): "TROPIPOC" in the last 168 hours.   BNP Recent Labs  Lab 01/16/22 1831 01/17/22 0231  BNP 192.4* 159.1*     DDimer No results for input(s): "DDIMER" in the last 168 hours.   Radiology    DG ESOPHAGUS W SINGLE CM (SOL OR THIN BA)  Result Date: 01/18/2022 CLINICAL DATA:  Complaint of esophageal dysphagia with sensation of stasis retrosternally after eating. Request for esophagram after speech therapy recommendation. EXAM: ESOPHAGUS/BARIUM SWALLOW/TABLET STUDY TECHNIQUE: Single contrast examination was performed using thin liquid barium. Limited study due to patient's inability to turn head or move on the table into different positions. This exam was performed by Ascencion Dike PA-C , and was supervised and interpreted by Dr. Suzy Bouchard.  FLUOROSCOPY: Radiation Exposure Index (as provided by the fluoroscopic device): 56.20 mGy Kerma COMPARISON:  None Available. FINDINGS: Swallowing: Appears normal. No vestibular penetration or aspiration seen. Lateral view assessment could not be performed. Pharynx: Unremarkable. Esophagus: No definitive mucosal lesions or masses. Persistent narrowing of the distal esophagus just above the GE junction. 13 mm barium tablet failed to pass the distal esophageal narrowing. Tablet would not move further despite additional thin barium and water. Barium and water do pass freely around the tablet. Esophageal motility: Limited evaluation of motility as patient could not lay in the prone position. Evidence of dysmotility present, likely presbyesophagus, resulting in some contrast stasis within the esophagus. Hiatal Hernia: None. Gastroesophageal reflux: None visualized. Other: None. IMPRESSION: 1. Persistent narrowing / stenosis of the distal esophagus with failure of the barium tab to pass. Recommend endoscopy to evaluate for benign or malignant stenosis of the distal esophagus. 2. No mucosal irregularity identified in esophagus. 3. Mild esophageal dysmotility favored presbyesophagus. Electronically Signed   By: Suzy Bouchard M.D.   On: 01/18/2022 14:09   DG Chest Port 1 View  Result Date: 01/18/2022 CLINICAL DATA:  Respiratory failure EXAM: PORTABLE CHEST 1 VIEW COMPARISON:  Radiograph 01/16/2022 FINDINGS: Unchanged enlarged cardiac silhouette. Bibasilar consolidations, similar to prior. Mildly increased interstitial opacities. No large pleural effusion or evidence of pneumothorax. Bones are unchanged. IMPRESSION: Bibasilar airspace disease, not significantly changed from prior, compatible with pneumonia. Mildly increased interstitial opacities could reflect a degree of interstitial edema. Electronically Signed   By: Maurine Simmering M.D.   On: 01/18/2022 08:38   ECHOCARDIOGRAM COMPLETE  Result Date: 01/17/2022     ECHOCARDIOGRAM REPORT   Patient Name:   [name] Date of Exam: 01/17/2022 Medical Rec #:  [id number]         Height:       73.0 in Accession #:    [id number]        Weight:       315.0 lb Date of Birth:  1940/10/15         BSA:          2.610 m Patient Age:    4 years          BP:  134/61 mmHg Patient Gender: M                 HR:           79 bpm. Exam Location:  Inpatient Procedure: 2D Echo, Cardiac Doppler, Color Doppler and Intracardiac            Opacification Agent Indications:    Elevated Troponin  History:        Patient has prior history of Echocardiogram examinations, most                 recent 09/29/2019. CHF, CAD, COPD, Signs/Symptoms:Dyspnea, Chest                 Pain, Shortness of Breath and Syncope; Risk                 Factors:Hypertension, Sleep Apnea and Dyslipidemia.  Sonographer:    Ronny Flurry Referring Phys: [id number] RAHUL P DESAI IMPRESSIONS  1. Left ventricular ejection fraction, by estimation, is 55 to 60%. The left ventricle has normal function. The left ventricle has no regional wall motion abnormalities. There is moderate concentric left ventricular hypertrophy. Indeterminate diastolic filling due to E-A fusion.  2. Right ventricular systolic function was not well visualized. The right ventricular size is not well visualized.  3. The mitral valve was not well visualized. No evidence of mitral valve regurgitation.  4. The aortic valve was not well visualized. Aortic valve regurgitation is not visualized. No aortic stenosis is present.  5. Aortic dilatation noted. There is mild dilatation of the aortic root, measuring 41 mm.  6. The inferior vena cava is normal in size with greater than 50% respiratory variability, suggesting right atrial pressure of 3 mmHg. Comparison(s): No significant change from prior study. FINDINGS  Left Ventricle: Left ventricular ejection fraction, by estimation, is 55 to 60%. The left ventricle has normal function. The left ventricle has no  regional wall motion abnormalities. Definity contrast agent was given IV to delineate the left ventricular  endocardial borders. The left ventricular internal cavity size was normal in size. There is moderate concentric left ventricular hypertrophy. Indeterminate diastolic filling due to E-A fusion. Right Ventricle: The right ventricular size is not well visualized. Right vetricular wall thickness was not well visualized. Right ventricular systolic function was not well visualized. Left Atrium: Left atrial size was normal in size. Right Atrium: Right atrial size was normal in size. Pericardium: There is no evidence of pericardial effusion. Presence of epicardial fat layer. Mitral Valve: The mitral valve was not well visualized. There is mild calcification of the anterior mitral valve leaflet(s). No evidence of mitral valve regurgitation. Tricuspid Valve: The tricuspid valve is grossly normal. Tricuspid valve regurgitation is trivial. No evidence of tricuspid stenosis. Aortic Valve: The aortic valve was not well visualized. Aortic valve regurgitation is not visualized. No aortic stenosis is present. Aortic valve mean gradient measures 8.0 mmHg. Aortic valve peak gradient measures 13.7 mmHg. Aortic valve area, by VTI measures 2.66 cm. Pulmonic Valve: The pulmonic valve was grossly normal. Pulmonic valve regurgitation is not visualized. No evidence of pulmonic stenosis. Aorta: Aortic dilatation noted. There is mild dilatation of the aortic root, measuring 41 mm. Venous: The inferior vena cava is normal in size with greater than 50% respiratory variability, suggesting right atrial pressure of 3 mmHg. IAS/Shunts: The interatrial septum was not well visualized.  LEFT VENTRICLE PLAX 2D LVIDd:         5.80 cm   Diastology LVIDs:  4.30 cm   LV e' medial:    4.32 cm/s LV PW:         1.40 cm   LV E/e' medial:  18.1 LV IVS:        1.50 cm   LV e' lateral:   5.26 cm/s LVOT diam:     2.30 cm   LV E/e' lateral: 14.9 LV SV:          87 LV SV Index:   33 LVOT Area:     4.15 cm  RIGHT VENTRICLE RV S prime:     16.60 cm/s LEFT ATRIUM           Index LA diam:      3.60 cm 1.38 cm/m LA Vol (A4C): 33.3 ml 12.76 ml/m  AORTIC VALVE AV Area (Vmax):    2.79 cm AV Area (Vmean):   2.63 cm AV Area (VTI):     2.66 cm AV Vmax:           185.00 cm/s AV Vmean:          133.000 cm/s AV VTI:            0.327 m AV Peak Grad:      13.7 mmHg AV Mean Grad:      8.0 mmHg LVOT Vmax:         124.33 cm/s LVOT Vmean:        84.033 cm/s LVOT VTI:          0.210 m LVOT/AV VTI ratio: 0.64  AORTA Ao Root diam: 4.10 cm Ao Asc diam:  3.30 cm MITRAL VALVE MV Area (PHT): 3.12 cm    SHUNTS MV Decel Time: 243 msec    Systemic VTI:  0.21 m MV E velocity: 78.30 cm/s  Systemic Diam: 2.30 cm MV A velocity: 60.00 cm/s MV E/A ratio:  1.31 Eleonore Chiquito MD Electronically signed by Eleonore Chiquito MD Signature Date/Time: 01/17/2022/3:05:57 PM    Final     Cardiac Studies   TTE 01/17/2022 IMPRESSIONS  1. Left ventricular ejection fraction, by estimation, is 55 to 60%. The left ventricle has normal function. The left ventricle has no regional wall motion abnormalities. There is moderate concentric left ventricular hypertrophy. Indeterminate diastolic filling due to E-A fusion.   2. Right ventricular systolic function was not well visualized. The right ventricular size is not well visualized.   3. The mitral valve was not well visualized. No evidence of mitral valve regurgitation.   4. The aortic valve was not well visualized. Aortic valve regurgitation is not visualized. No aortic stenosis is present.   5. Aortic dilatation noted. There is mild dilatation of the aortic root, measuring 41 mm.   6. The inferior vena cava is normal in size with greater than 50% respiratory variability, suggesting right atrial pressure of 3 mmHg.  Comparison(s): No significant change from prior study.  FINDINGS   Left Ventricle: Left ventricular ejection fraction, by estimation, is 55  to 60%. The left ventricle has normal function. The left ventricle has no regional wall motion abnormalities. Definity contrast agent was given IV  to delineate the left ventricular  endocardial borders. The left ventricular internal cavity size was normal  in size. There is moderate concentric left ventricular hypertrophy. Indeterminate diastolic filling due to E-A fusion.   Right Ventricle: The right ventricular size is not well visualized. Right vetricular wall thickness was not well visualized. Right ventricular  systolic function was not well visualized.   Left Atrium: Left  atrial size was normal in size.   Right Atrium: Right atrial size was normal in size.   Pericardium: There is no evidence of pericardial effusion. Presence of epicardial fat layer.   Mitral Valve: The mitral valve was not well visualized. There is mild calcification of the anterior mitral valve leaflet(s). No evidence of mitral valve regurgitation.   Tricuspid Valve: The tricuspid valve is grossly normal. Tricuspid valve regurgitation is trivial. No evidence of tricuspid stenosis.   Aortic Valve: The aortic valve was not well visualized. Aortic valve regurgitation is not visualized. No aortic stenosis is present. Aortic valve mean gradient measures 8.0 mmHg. Aortic valve peak gradient measures  13.7 mmHg. Aortic valve area, by VTI measures 2.66 cm.   Pulmonic Valve: The pulmonic valve was grossly normal. Pulmonic valve regurgitation is not visualized. No evidence of pulmonic stenosis.   Aorta: Aortic dilatation noted. There is mild dilatation of the aortic root, measuring 41 mm.   Venous: The inferior vena cava is normal in size with greater than 50%  respiratory variability, suggesting right atrial pressure of 3 mmHg.   IAS/Shunts: The interatrial septum was not well visualized.    Patient Profile     81 y.o. male  hx of CAD, COPD, OSA, Dementia and HFpEF who is being seen 01/17/2022 for the evaluation of  SOB   Assessment & Plan    Chronic heart failure with preserved ejection fraction Bilateral pneumonia Hypertensive urgency-has resolved COPD OSA Acute proximal respiratory failure-was on BiPAP now on nasal cannula at 6 L (baseline is 4 L) Elevated troponin  He is n.p.o. plan for endoscopy today. Clinically he is responding to the IV Lasix.  He is currently at a negative balance.  Will continue the Lasix at current dosing. Generalized respiratory status he is back to his baseline. His pneumonia is being managed by the primary team. His echocardiogram done yesterday does not show any significant change from prior. Blood pressure has improved slightly but still elevated increase the amlodipine to 10 mg daily.  Restarting his home beta-blocker. Strict I's and O's, daily weights.    For questions or updates, please contact [place] Please consult [email or web address] for contact info under Cardiology/STEMI.      Signed, Berniece Salines, DO  01/19/2022, 7:38 AM

## 2022-01-19 NOTE — Assessment & Plan Note (Addendum)
On admission, patient complained of several months dysphagia, including regurgitating undigested food.  Here, barium esophagram showed narrowing / stenosis of the distal esophagus with failure of the barium tab to pass   GI consulted and EGD 12/16 showed schatzki ring, which was dilated successfully, no malignant appearing lesions.  EGD appeared to show some esophageal dysmotility at well.    Evaluated by SLP who noticed a little oropharyngeal dysphagia/aspiration, recommended soft and nectar thick.  Has been doing well on that although has repeatedly ask for liberalizing diet. - Continue speech therapy - I suspect as his overall functional status and deconditioning improves, his oropharyngeal dysphagia will improve

## 2022-01-19 NOTE — Care Management Important Message (Signed)
Important Message  Patient Details  Name: [name] MRN: [id number] Date of Birth: 17-Dec-1940   Medicare Important Message Given:  Yes     Hannah Beat 01/19/2022, 12:31 PM

## 2022-01-19 NOTE — Assessment & Plan Note (Signed)
Baseline 1.1.  Cr here up to 1.5, improving steadily with diruesis

## 2022-01-19 NOTE — Hospital Course (Signed)
Mr. Cart is an 81 y.o. M with COPD and dCHF and morbid obesity BMI 40.4 with chronic respiratory failure on 4L home O2, HTN, OSA on BiPAP, HLD and CAD s/p PCI >1yr ulcerative colitis not on therapy who presented with dyspnea, fever and lower extremity swelling.  CTA in the ER showed bilateral lower lobe and RML airspace opacities. In respiratory distress requiring BiPAP on admission to ICU with antibiotics/diuretics.    12/13: Admitted on antibiotics, diuretics 12/14: Urine strep antigen positive; barium esophagram shows narrowing, GI consulted 12/16: EGD with schatzki ring, dilated successully

## 2022-01-19 NOTE — Assessment & Plan Note (Addendum)
Urine S pneumo antigen positive earlier in this hospital stay, but still with low grade temps 99-100F, infiltrates on CXR, sputum and WBC >12K.  I am concerned about an aspiration, or about treatment failure if we stop after a standard 5 day course.  I have sent a sputum for culture. - Continue Rocephin day 9 - Follow sputum - Aggressive pulmonary toilet ordered, Flutter and IS, mobilization

## 2022-01-19 NOTE — Assessment & Plan Note (Signed)
Due to CHF, pneumonia.  No ischemia.

## 2022-01-19 NOTE — Assessment & Plan Note (Addendum)
Multifactorial, largely driven by resolving CHF and pneumonia, possibly also obesity/OSA and atelectasis.  ABG and CXR 12/19 were stable/normal, no significant effusions.  Today weaned to 5L, close to baseline 4L.

## 2022-01-19 NOTE — Assessment & Plan Note (Addendum)
Still orthopneic. Net negative 1.9L yesterday, documented weights inaccurate. - Continue IV Lasix - Continue Potassium - Strict I/Os, daily weights, daily BMP - Consult Cardiology

## 2022-01-19 NOTE — Assessment & Plan Note (Signed)
Continue pravastatin 

## 2022-01-20 ENCOUNTER — Inpatient Hospital Stay (HOSPITAL_COMMUNITY): Payer: Medicare Other

## 2022-01-20 ENCOUNTER — Encounter (HOSPITAL_COMMUNITY): Payer: Self-pay | Admitting: Pulmonary Disease

## 2022-01-20 ENCOUNTER — Inpatient Hospital Stay (HOSPITAL_COMMUNITY): Payer: Medicare Other | Admitting: Certified Registered"

## 2022-01-20 ENCOUNTER — Encounter (HOSPITAL_COMMUNITY)
Admission: EM | Disposition: A | Discharge status: Skilled Nursing Facility | Payer: Self-pay | Source: Home / Self Care | Attending: Family Medicine

## 2022-01-20 DIAGNOSIS — J189 Pneumonia, unspecified organism: Secondary | ICD-10-CM | POA: Diagnosis not present

## 2022-01-20 DIAGNOSIS — I5033 Acute on chronic diastolic (congestive) heart failure: Secondary | ICD-10-CM | POA: Diagnosis not present

## 2022-01-20 DIAGNOSIS — K222 Esophageal obstruction: Secondary | ICD-10-CM

## 2022-01-20 DIAGNOSIS — I509 Heart failure, unspecified: Secondary | ICD-10-CM

## 2022-01-20 DIAGNOSIS — K224 Dyskinesia of esophagus: Secondary | ICD-10-CM

## 2022-01-20 DIAGNOSIS — K2990 Gastroduodenitis, unspecified, without bleeding: Secondary | ICD-10-CM | POA: Diagnosis not present

## 2022-01-20 DIAGNOSIS — I169 Hypertensive crisis, unspecified: Secondary | ICD-10-CM | POA: Diagnosis not present

## 2022-01-20 DIAGNOSIS — K449 Diaphragmatic hernia without obstruction or gangrene: Secondary | ICD-10-CM

## 2022-01-20 DIAGNOSIS — I110 Hypertensive heart disease with heart failure: Secondary | ICD-10-CM

## 2022-01-20 DIAGNOSIS — J9611 Chronic respiratory failure with hypoxia: Secondary | ICD-10-CM

## 2022-01-20 DIAGNOSIS — I2510 Atherosclerotic heart disease of native coronary artery without angina pectoris: Secondary | ICD-10-CM

## 2022-01-20 DIAGNOSIS — Z87891 Personal history of nicotine dependence: Secondary | ICD-10-CM

## 2022-01-20 DIAGNOSIS — J9621 Acute and chronic respiratory failure with hypoxia: Secondary | ICD-10-CM | POA: Diagnosis not present

## 2022-01-20 HISTORY — PX: ESOPHAGOGASTRODUODENOSCOPY (EGD) WITH PROPOFOL: SHX5813

## 2022-01-20 HISTORY — PX: ESOPHAGEAL DILATION: SHX303

## 2022-01-20 LAB — BASIC METABOLIC PANEL
Anion gap: 11 (ref 5–15)
BUN: 22 mg/dL (ref 8–23)
CO2: 28 mmol/L (ref 22–32)
Calcium: 10 mg/dL (ref 8.9–10.3)
Chloride: 101 mmol/L (ref 98–111)
Creatinine, Ser: 1.39 mg/dL — ABNORMAL HIGH (ref 0.61–1.24)
GFR, Estimated: 50 mL/min — ABNORMAL LOW (ref >60)
Glucose, Bld: 127 mg/dL — ABNORMAL HIGH (ref 70–99)
Potassium: 3.6 mmol/L (ref 3.5–5.1)
Sodium: 140 mmol/L (ref 135–145)

## 2022-01-20 LAB — CBC
HCT: 42 % (ref 39.0–52.0)
Hemoglobin: 13.9 g/dL (ref 13.0–17.0)
MCH: 30.2 pg (ref 26.0–34.0)
MCHC: 33.1 g/dL (ref 30.0–36.0)
MCV: 91.1 fL (ref 80.0–100.0)
Platelets: 270 10*3/uL (ref 150–400)
RBC: 4.61 MIL/uL (ref 4.22–5.81)
RDW: 13.8 % (ref 11.5–15.5)
WBC: 12.2 10*3/uL — ABNORMAL HIGH (ref 4.0–10.5)
nRBC: 0 % (ref 0.0–0.2)

## 2022-01-20 SURGERY — ESOPHAGOGASTRODUODENOSCOPY (EGD) WITH PROPOFOL
Anesthesia: Monitor Anesthesia Care

## 2022-01-20 MED ORDER — PROPOFOL 500 MG/50ML IV EMUL
INTRAVENOUS | Status: DC | PRN
  Administered 2022-01-20: 75 ug/kg/min via INTRAVENOUS

## 2022-01-20 MED ORDER — FUROSEMIDE 40 MG PO TABS
40.0000 mg | ORAL_TABLET | Freq: Every day | ORAL | Status: DC
  Administered 2022-01-20: 40 mg via ORAL
  Filled 2022-01-20: qty 1

## 2022-01-20 MED ORDER — LIDOCAINE 2% (20 MG/ML) 5 ML SYRINGE
INTRAMUSCULAR | Status: DC | PRN
  Administered 2022-01-20: 40 mg via INTRAVENOUS

## 2022-01-20 MED ORDER — LACTATED RINGERS IV SOLN: INTRAVENOUS | Status: DC

## 2022-01-20 SURGICAL SUPPLY — 15 items

## 2022-01-20 NOTE — Progress Notes (Signed)
[facility] Gastroenterology Progress Note  [name] 81 y.o. 1940-08-08  CC:   Dysphagia  Subjective: Patient seen and examined at bedside.  Continues to have mild shortness of breath.  Bringing up some sputum.  Continues to have sensation of food stuck in esophagus.  ROS : Afebrile, positive for shortness of breath   Objective: Vital signs in last 24 hours: Vitals:   01/20/22 0700 01/20/22 1024  BP:  (!) 159/72  Pulse:  100  Resp:  (!) 22  Temp: 98.4 F (36.9 C) 97.9 F (36.6 C)  SpO2:  90%    Physical Exam: General:  obese, oxygen by nasal cannula Lungs: Decreased air entry bilaterally, anterior exam only Heart:  Regular rate and rhythm; no murmurs, clicks, rubs,  or gallops. Abdomen: Mildly distended, soft, bowel sounds present, no peritoneal signs Neuro -alert and oriented x 3 Psych -mood and affect normal Rectal:  Deferred   Lab Results: Recent Labs    01/18/22 0426 01/19/22 0419 01/20/22 0259  NA 140 140 140  K 3.4* 3.4* 3.6  CL 103 100 101  CO2 27 28 28   GLUCOSE 118* 123* 127*  BUN 22 22 22   CREATININE 1.50* 1.40* 1.39*  CALCIUM 9.7 9.9 10.0  MG 2.2  --   --   PHOS 3.2  --   --    No results for input(s): "AST", "ALT", "ALKPHOS", "BILITOT", "PROT", "ALBUMIN" in the last 72 hours. Recent Labs    01/19/22 0419 01/20/22 0259  WBC 10.7* 12.2*  HGB 13.5 13.9  HCT 41.4 42.0  MCV 92.4 91.1  PLT 262 270   No results for input(s): "LABPROT", "INR" in the last 72 hours.    Assessment/Plan: -Dysphagia with abnormal barium swallow concerning for GE junction stricture. -Dysphagia.  Appears to be combined oropharyngeal and esophageal -Hypoxic respiratory failure.  Currently on 5 L of oxygen. -Multiple other comorbidities   Recommendations --------------------------- -Discussed with patient's daughter over the phone.  Patient as well as patient's family is agreeable to proceed with EGD with possible dilation today.  They understand high risk of  procedure related complication because of his comorbidities. -Also discussed with anesthesia physician. -Proceed with EGD with possible dilation today   Risks (bleeding, infection, bowel perforation that could require surgery, sedation-related changes in cardiopulmonary systems), benefits (identification and possible treatment of source of symptoms, exclusion of certain causes of symptoms), and alternatives (watchful waiting, radiographic imaging studies, empiric medical treatment)  were explained to patient/family in detail and patient wishes to proceed.    Otis Brace MD, [place] 01/20/2022, 10:28 AM  Contact #  [phone]

## 2022-01-20 NOTE — Transfer of Care (Signed)
Immediate Anesthesia Transfer of Care Note  Patient: [name]  Procedure(s) Performed: ESOPHAGOGASTRODUODENOSCOPY (EGD) WITH PROPOFOL ESOPHAGEAL DILATION  Patient Location: PACU  Anesthesia Type:MAC  Level of Consciousness: awake, alert , oriented, and patient cooperative  Airway & Oxygen Therapy: Patient Spontanous Breathing and Patient connected to face mask oxygen  Post-op Assessment: Report given to RN and Post -op Vital signs reviewed and stable  Post vital signs: Reviewed and stable  Last Vitals:  Vitals Value Taken Time  BP    Temp    Pulse 96 01/20/22 1100  Resp 33 01/20/22 1100  SpO2 86 % 01/20/22 1100  Vitals shown include unvalidated device data.  Last Pain:  Vitals:   01/20/22 1024  TempSrc: Temporal  PainSc: 0-No pain      Patients Stated Pain Goal: 0 (42/59/56 3875)  Complications: No notable events documented.

## 2022-01-20 NOTE — Op Note (Signed)
[facility] Patient Name: [name] Procedure Date : 01/20/2022 MRN: [id number] Attending MD: Otis Brace , MD, [id number] Date of Birth: 1940/07/11 CSN: [id number] Age: 81 Admit Type: Inpatient Procedure:                Upper GI endoscopy Indications:              Dysphagia, Abnormal cine-esophagram Providers:                Otis Brace, MD, Jaci Carrel, RN, Darliss Cheney, Technician, Mardene Celeste "Trish" Durene Romans, CRNA Referring MD:              Medicines:                Sedation Administered by an Anesthesia Professional Complications:            No immediate complications. Estimated Blood Loss:     Estimated blood loss was minimal. Procedure:                Pre-Anesthesia Assessment:                           - Prior to the procedure, a History and Physical                            was performed, and patient medications and                            allergies were reviewed. The patient's tolerance of                            previous anesthesia was also reviewed. The risks                            and benefits of the procedure and the sedation                            options and risks were discussed with the patient.                            All questions were answered, and informed consent                            was obtained. Prior Anticoagulants: The patient has                            taken no anticoagulant or antiplatelet agents. ASA                            Grade Assessment: III - A patient with severe                            systemic disease. After reviewing the risks and  benefits, the patient was deemed in satisfactory                            condition to undergo the procedure.                           After obtaining informed consent, the endoscope was                            passed under direct vision. Throughout the                            procedure, the  patient's blood pressure, pulse, and                            oxygen saturations were monitored continuously. The                            GIF-H190 ([id number]) Olympus endoscope was introduced                            through the mouth, and advanced to the second part                            of duodenum. The upper GI endoscopy was                            accomplished without difficulty. The patient                            tolerated the procedure well. Scope In: Scope Out: Findings:      A non-obstructing Schatzki ring was found at the gastroesophageal       junction. A TTS dilator was passed through the scope. Dilation with a       15-16.5-18 mm balloon dilator was performed to 18 mm. The dilation site       was examined following endoscope reinsertion and showed no bleeding,       mucosal tear or perforation.      A small hiatal hernia was present.      Abnormal motility was noted in the esophagus. Tertiary peristaltic waves       are noted.      Scattered mild inflammation characterized by erythema was found in the       gastric antrum and in the prepyloric region of the stomach.      The cardia and gastric fundus were normal on retroflexion.      Patchy mild inflammation was found in the duodenal bulb, in the first       portion of the duodenum and in the second portion of the duodenum. Impression:               - Non-obstructing Schatzki ring. Dilated.                           - Small hiatal hernia.                           -  Abnormal esophageal motility, consistent with                            presbyesophagus.                           - Gastritis.                           - Duodenitis.                           - No specimens collected. Recommendation:           - Return patient to hospital ward for ongoing care.                           - Soft diet.                           - Continue present medications. Procedure Code(s):        --- Professional ---                            [phone], Esophagogastroduodenoscopy, flexible,                            transoral; with transendoscopic balloon dilation of                            esophagus (less than 30 mm diameter) Diagnosis Code(s):        --- Professional ---                           K22.2, Esophageal obstruction                           K44.9, Diaphragmatic hernia without obstruction or                            gangrene                           K22.4, Dyskinesia of esophagus                           K29.70, Gastritis, unspecified, without bleeding                           K29.80, Duodenitis without bleeding                           R13.10, Dysphagia, unspecified                           R93.3, Abnormal findings on diagnostic imaging of                            other parts of digestive tract CPT copyright 2022 [employer]. All rights  reserved. The codes documented in this report are preliminary and upon coder review may  be revised to meet current compliance requirements. Otis Brace, MD Otis Brace, MD 01/20/2022 11:04:05 AM Number of Addenda: 0

## 2022-01-20 NOTE — Progress Notes (Signed)
Rounding Note    Patient Name: [name] Date of Encounter: 01/20/2022  [place] Cardiologist: Quay Burow, MD   Subjective   Net -2.1 L yesterday, -5.5 L on admission.  Creatinine stable at 1.39. Reports dyspnea improved  Inpatient Medications    Scheduled Meds:  amLODipine  10 mg Oral Daily   aspirin  81 mg Oral Daily   Chlorhexidine Gluconate Cloth  6 each Topical Daily   DULoxetine  60 mg Oral Daily   furosemide  60 mg Intravenous Q12H   heparin  5,000 Units Subcutaneous Q8H   isosorbide mononitrate  60 mg Oral Daily   metoprolol tartrate  25 mg Oral Q12H   pravastatin  40 mg Oral Daily   Continuous Infusions:  cefTRIAXone (ROCEPHIN)  IV 2 g (01/19/22 2029)   PRN Meds: acetaminophen **OR** acetaminophen, albuterol, docusate sodium, hydrALAZINE, polyethylene glycol   Vital Signs    Vitals:   01/20/22 1100 01/20/22 1110 01/20/22 1120 01/20/22 1149  BP: 139/64 (!) 156/79 (!) 169/52 (!) 166/80  Pulse: 91 90 98 87  Resp: (!) 22 20 16 15   Temp:      TempSrc:      SpO2: 90% 90% 90% 92%  Weight:      Height:        Intake/Output Summary (Last 24 hours) at 01/20/2022 1429 Last data filed at 01/20/2022 2703 Gross per 24 hour  Intake 593.02 ml  Output 1700 ml  Net -1106.98 ml      01/20/2022   10:24 AM 01/19/2022    4:34 AM 01/18/2022    5:00 AM  Last 3 Weights  Weight (lbs) 306 lb 14.1 oz 306 lb 14.1 oz 306 lb  Weight (kg) 139.2 kg 139.2 kg 138.8 kg      Telemetry    NSR - Personally Reviewed  ECG    No new ECG - Personally Reviewed  Physical Exam   GEN: No acute distress.   Neck: No JVD Cardiac: RRR, no murmurs, rubs, or gallops.  Respiratory: Clear to auscultation bilaterally. GI: Soft, nontender, non-distended  MS: No edema Neuro:  Nonfocal  Psych: Normal affect   Labs    High Sensitivity Troponin:   Recent Labs  Lab 01/16/22 1831 01/16/22 2015 01/17/22 0231  TROPONINIHS 402* 540* 451*      Chemistry Recent Labs  Lab 01/16/22 1831 01/16/22 1858 01/17/22 0231 01/18/22 0426 01/19/22 0419 01/20/22 0259  NA 140   < > 139 140 140 140  K 3.7   < > 3.4* 3.4* 3.4* 3.6  CL 105  --  104 103 100 101  CO2 23  --  23 27 28 28   GLUCOSE 126*  --  163* 118* 123* 127*  BUN 20  --  19 22 22 22   CREATININE 1.40*  --  1.51* 1.50* 1.40* 1.39*  CALCIUM 10.0  --  9.3 9.7 9.9 10.0  MG  --   --  1.5* 2.2  --   --   PROT 7.3  --   --   --   --   --   ALBUMIN 3.1*  --   --   --   --   --   AST 43*  --   --   --   --   --   ALT 38  --   --   --   --   --   ALKPHOS 88  --   --   --   --   --  BILITOT 0.9  --   --   --   --   --   GFRNONAA 50*  --  46* 46* 50* 50*  ANIONGAP 12  --  12 10 12 11    < > = values in this interval not displayed.    Lipids No results for input(s): "CHOL", "TRIG", "HDL", "LABVLDL", "LDLCALC", "CHOLHDL" in the last 168 hours.  Hematology Recent Labs  Lab 01/18/22 0426 01/19/22 0419 01/20/22 0259  WBC 12.7* 10.7* 12.2*  RBC 4.24 4.48 4.61  HGB 12.9* 13.5 13.9  HCT 39.8 41.4 42.0  MCV 93.9 92.4 91.1  MCH 30.4 30.1 30.2  MCHC 32.4 32.6 33.1  RDW 14.1 13.7 13.8  PLT 228 262 270   Thyroid No results for input(s): "TSH", "FREET4" in the last 168 hours.  BNP Recent Labs  Lab 01/16/22 1831 01/17/22 0231  BNP 192.4* 159.1*    DDimer No results for input(s): "DDIMER" in the last 168 hours.   Radiology    DG CHEST PORT 1 VIEW  Result Date: 01/20/2022 CLINICAL DATA:  Sputum production EXAM: PORTABLE CHEST 1 VIEW COMPARISON:  January 18, 2022 chest x-ray FINDINGS: No pneumothorax. Stable cardiomegaly. The hila and mediastinum are unchanged. Mild bibasilar opacities are stable. No other interval changes. IMPRESSION: Mild bibasilar opacities may represent atelectasis or developing infiltrates. Recommend attention on follow-up. Electronically Signed   By: Dorise Bullion III M.D.   On: 01/20/2022 11:44    Cardiac Studies     Patient Profile     81 y.o.  male hx of CAD, COPD on 4LNC, OSA, Dementia and HFpEF who is being seen  for the evaluation of SOB  Assessment & Plan    Acute on chronic diastolic heart failure: echo with EF 55-60%, moderate LVH, indeterminate diastolic function.   -Appears euvolemic, will transition to PO lasix  Acute on chronic respiratory failure with hypoxia: likely multifactorial due to CAP, acute on chronic diastolic heart failure, COPD. -Diuresis as above -Abx per primary team  Hypertensive urgency: required Nitro gtt, now with improvement in pressures and transitioned to PO. On amlodpine and imdur and metoprolol  Elevated troponin: likely demand ischemia in setting of acute illness.  Peaked 540  Esophageal stricture: noted on esophagram, underwent an EGD today which showed lower esophageal ring which was dilated  AKI: monitor with diuresis  HLD: on statin  For questions or updates, please contact [place] Please consult [email or web address] for contact info under        Signed, Donato Heinz, MD  01/20/2022, 2:29 PM

## 2022-01-20 NOTE — Anesthesia Preprocedure Evaluation (Signed)
Anesthesia Evaluation  Patient identified by MRN, date of birth, ID band Patient awake    Reviewed: Allergy & Precautions, NPO status , Patient's Chart, lab work & pertinent test results  Airway Mallampati: II  TM Distance: >3 FB Neck ROM: Full    Dental no notable dental hx.    Pulmonary asthma , sleep apnea and Continuous Positive Airway Pressure Ventilation , COPD, former smoker   Pulmonary exam normal        Cardiovascular hypertension, Pt. on medications and Pt. on home beta blockers + CAD and +CHF   Rhythm:Regular Rate:Normal  ECHO 12/23:  1. Left ventricular ejection fraction, by estimation, is 55 to 60%. The  left ventricle has normal function. The left ventricle has no regional  wall motion abnormalities. There is moderate concentric left ventricular  hypertrophy. Indeterminate diastolic  filling due to E-A fusion.   2. Right ventricular systolic function was not well visualized. The right  ventricular size is not well visualized.   3. The mitral valve was not well visualized. No evidence of mitral valve  regurgitation.   4. The aortic valve was not well visualized. Aortic valve regurgitation  is not visualized. No aortic stenosis is present.   5. Aortic dilatation noted. There is mild dilatation of the aortic root,  measuring 41 mm.   6. The inferior vena cava is normal in size with greater than 50%  respiratory variability, suggesting right atrial pressure of 3 mmHg.   Comparison(s): No significant change from prior study.     Neuro/Psych    Depression    negative neurological ROS     GI/Hepatic Neg liver ROS, hiatal hernia, PUD,GERD  ,,Dysphagia    Endo/Other  negative endocrine ROS    Renal/GU   negative genitourinary   Musculoskeletal  (+) Arthritis , Osteoarthritis,    Abdominal Normal abdominal exam  (+)   Peds  Hematology negative hematology ROS (+)   Anesthesia Other Findings    Reproductive/Obstetrics                             Anesthesia Physical Anesthesia Plan  ASA: 3  Anesthesia Plan: MAC   Post-op Pain Management:    Induction: Intravenous  PONV Risk Score and Plan: 1 and Propofol infusion and Treatment may vary due to age or medical condition  Airway Management Planned: Simple Face Mask, Natural Airway and Nasal Cannula  Additional Equipment: None  Intra-op Plan:   Post-operative Plan:   Informed Consent: I have reviewed the patients History and Physical, chart, labs and discussed the procedure including the risks, benefits and alternatives for the proposed anesthesia with the patient or authorized representative who has indicated his/her understanding and acceptance.     Dental advisory given  Plan Discussed with: CRNA  Anesthesia Plan Comments:        Anesthesia Quick Evaluation

## 2022-01-20 NOTE — Brief Op Note (Signed)
01/16/2022 - 01/20/2022  10:58 AM  PATIENT:  [name]  81 y.o. male  PRE-OPERATIVE DIAGNOSIS:  Dysphagia, abnormal barium swallow  POST-OPERATIVE DIAGNOSIS:  esophageal stricture treated with balloon dilation  PROCEDURE:  Procedure(s): ESOPHAGOGASTRODUODENOSCOPY (EGD) WITH PROPOFOL ([name]) ESOPHAGEAL DILATION  SURGEON:  Surgeon(s) and Role:    * Jotham Ahn, MD - Primary  Findings ---------- -EGD showed lower esophageal ring which was nonobstructing.  Dilated with up to 18 mm balloon.  Also showed mild gastritis and duodenitis.  Also had esophageal dysmotility.  Recommendations ------------------------- -I do not think he is nonobstructing lower esophageal ring was his only problem for dysphagia.  I think it is multifactorial with oropharyngeal dysphagia as well as esophageal dysmotility. -Advance diet as tolerated per speech therapy recommendations -Recommend Protonix 40 mg daily  -Endoscopy findings discussed with patient's daughter over the phone. -Discussed with hospitalist. -No further inpatient GI workup planned.  GI will sign off.  Call us back if needed.  Otis Brace MD, [place] 01/20/2022, 11:00 AM  Contact #  [phone]

## 2022-01-20 NOTE — Progress Notes (Signed)
  Progress Note   Patient: [name] [id number] DOB: 04/26/1940 DOA: 01/16/2022     3 DOS: the patient was seen and examined on 01/20/2022 at 12:25PM      Brief hospital course: Mr. Krogh is an 81 y.o. M with COPD and dCHF and morbid obesity BMI 40.4 with chronic respiratory failure on 4L home O2, HTN, OSA on BiPAP, HLD and CAD s/p PCI >26yr ulcerative colitis not on therapy who presented with dyspnea, fever and lower extremity swelling.  CTA in the ER showed bilateral lower lobe and RML airspace opacities. In respiratory distress requiring BiPAP on admission to ICU with antibiotics/diuretics.    12/13: Admitted on antibiotics, diuretics 12/14: Urine strep antigen positive; barium esophagram shows narrowing, GI consulted 12/16: EGD with schatzki ring, dilated successully      Assessment and Plan: * Acute on chronic hypoxic respiratory failure [place] Up to 6[place] today, more than his baseline.  Multifactorial from CHF, pneumonia.    Acute on chronic diastolic heart failure ([name]) Net negative 2.1L yesterday, no weights. Swelling seems improved. - Continue IV Lasix - Continue Potassium - Strict I/Os, daily weights, daily BMP - Consult Cardiology  Community acquired pneumonia of bilateral lower lobes and right middle lobe due to Streptoccocus pneumoniae - Continue Rocephin - Flutter and IS  Hypertensive crisis BP sligthly elevated - Continue amlodipine, furoemide, Imdur, metoprolol, PRN hydralazine  Esophageal dysphagia Barium esophagram showed: Persistent narrowing / stenosis of the distal esophagus with failure of the barium tab to pass   EGD 12/16 showed schatzki ring, dilated successfully, no malignant appearing lesions. GI suspect some esophageal dysmotility at well - Advance to soft diet   Hypokalemia - Supplement K  Myocardial injury Due to CHF, pneumonia.  No ischemia.  AKI (acute kidney injury) ([place] Baseline 1.1.  Cr here up to  1.5  Depression, major, single episode, moderate ([name]) - Continue Cymbalta  Chronic respiratory failure with hypoxia ([name]) - Continue supplemental O2  COPD (chronic obstructive pulmonary disease) ([name]) - Continue albuterol  Morbid obesity ([name]) BMI 40.4  Dyslipidemia, goal LDL below 70 - Continue pravastatin  Essential hypertension See above  CAD S/P percutaneous coronary angioplasty - Continue aspirin, pravsattin, BB  OSA treated with BiPAP - Continue BiPAP at night          Subjective: A lot of coughing, and some delirium.  No fever, no vomiting.  Has pain in the left great toe.  Has no swelling.     Physical Exam: BP (!) 166/80 (BP Location: Right Arm)   Pulse 87   Temp 98.5 F (36.9 C) (Temporal)   Resp 15   Ht 6' 1"  (1.854 m)   Wt (!) 139.2 kg   SpO2 92%   BMI 40.49 kg/m   Obese adult male, lying in bed, appears weak and tired, nasal cannula in place RRR, no murmurs, no peripheral edema Respiratory rate seems elevated, she is congested and diminished overall Abdomen soft without tenderness palpation Attention normal, affect normal, judgment Syprine normal    Data Reviewed: Sodium potassium normal Creatinine down to 1.4 White blood cell count 12   Family Communication: Daughters at the bedside    Disposition: Status is: Inpatient         Author: CEdwin Dada MD 01/20/2022 4:24 PM  For on call review [email or web address]

## 2022-01-21 DIAGNOSIS — J9621 Acute and chronic respiratory failure with hypoxia: Secondary | ICD-10-CM | POA: Diagnosis not present

## 2022-01-21 DIAGNOSIS — I5033 Acute on chronic diastolic (congestive) heart failure: Secondary | ICD-10-CM | POA: Diagnosis not present

## 2022-01-21 DIAGNOSIS — I169 Hypertensive crisis, unspecified: Secondary | ICD-10-CM | POA: Diagnosis not present

## 2022-01-21 DIAGNOSIS — J189 Pneumonia, unspecified organism: Secondary | ICD-10-CM | POA: Diagnosis not present

## 2022-01-21 LAB — BASIC METABOLIC PANEL
Anion gap: 9 (ref 5–15)
BUN: 30 mg/dL — ABNORMAL HIGH (ref 8–23)
CO2: 28 mmol/L (ref 22–32)
Calcium: 9.8 mg/dL (ref 8.9–10.3)
Chloride: 100 mmol/L (ref 98–111)
Creatinine, Ser: 1.42 mg/dL — ABNORMAL HIGH (ref 0.61–1.24)
GFR, Estimated: 49 mL/min — ABNORMAL LOW (ref >60)
Glucose, Bld: 139 mg/dL — ABNORMAL HIGH (ref 70–99)
Potassium: 3.6 mmol/L (ref 3.5–5.1)
Sodium: 137 mmol/L (ref 135–145)

## 2022-01-21 LAB — CBC
HCT: 40 % (ref 39.0–52.0)
Hemoglobin: 13 g/dL (ref 13.0–17.0)
MCH: 30 pg (ref 26.0–34.0)
MCHC: 32.5 g/dL (ref 30.0–36.0)
MCV: 92.2 fL (ref 80.0–100.0)
Platelets: 265 10*3/uL (ref 150–400)
RBC: 4.34 MIL/uL (ref 4.22–5.81)
RDW: 13.7 % (ref 11.5–15.5)
WBC: 14.8 10*3/uL — ABNORMAL HIGH (ref 4.0–10.5)
nRBC: 0 % (ref 0.0–0.2)

## 2022-01-21 LAB — CULTURE, BLOOD (ROUTINE X 2)
Culture: NO GROWTH
Special Requests: ADEQUATE

## 2022-01-21 LAB — MAGNESIUM: Magnesium: 2.2 mg/dL (ref 1.7–2.4)

## 2022-01-21 MED ORDER — IPRATROPIUM-ALBUTEROL 0.5-2.5 (3) MG/3ML IN SOLN
3.0000 mL | Freq: Two times a day (BID) | RESPIRATORY_TRACT | Status: DC
  Administered 2022-01-21 – 2022-01-25 (×9): 3 mL via RESPIRATORY_TRACT
  Filled 2022-01-21 (×11): qty 3

## 2022-01-21 MED ORDER — SODIUM CHLORIDE 0.9 % IV SOLN
2.0000 g | INTRAVENOUS | Status: AC
  Administered 2022-01-21 – 2022-01-22 (×2): 2 g via INTRAVENOUS
  Filled 2022-01-21 (×2): qty 20

## 2022-01-21 MED ORDER — FUROSEMIDE 10 MG/ML IJ SOLN
40.0000 mg | Freq: Two times a day (BID) | INTRAMUSCULAR | Status: AC
  Administered 2022-01-21 (×2): 40 mg via INTRAVENOUS
  Filled 2022-01-21 (×2): qty 4

## 2022-01-21 MED ORDER — BUDESONIDE 0.25 MG/2ML IN SUSP
0.2500 mg | Freq: Two times a day (BID) | RESPIRATORY_TRACT | Status: DC
  Administered 2022-01-22 – 2022-01-25 (×9): 0.25 mg via RESPIRATORY_TRACT
  Filled 2022-01-21 (×11): qty 2

## 2022-01-21 MED ORDER — DICLOFENAC SODIUM 1 % EX GEL
2.0000 g | Freq: Four times a day (QID) | CUTANEOUS | Status: DC
  Administered 2022-01-21 – 2022-01-26 (×13): 2 g via TOPICAL
  Filled 2022-01-21: qty 100

## 2022-01-21 NOTE — Progress Notes (Signed)
Progress Note   Patient: [name] [id number] DOB: Jul 01, 1940 DOA: 01/16/2022     4 DOS: the patient was seen and examined on 01/21/2022 at 9:20AM      Brief hospital course: [name] is an 81 y.o. M with COPD and dCHF and morbid obesity BMI 40.4 with chronic respiratory failure on 4L home O2, HTN, OSA on BiPAP, HLD and CAD s/p PCI >42yr ulcerative colitis not on therapy who presented with dyspnea, fever and lower extremity swelling.  CTA in the ER showed bilateral lower lobe and RML airspace opacities. In respiratory distress requiring BiPAP on admission to ICU with antibiotics/diuretics.    12/13: Admitted on antibiotics, diuretics 12/14: Urine strep antigen positive; barium esophagram shows narrowing, GI consulted 12/16: EGD with schatzki ring, dilated successully     Assessment and Plan: * Acute on chronic hypoxic respiratory failure ([name] on 6L.  Multifactorial from CHF, pneumonia.    Acute on chronic diastolic heart failure ([name]) Neg negative 850cc yesterday. Still orthopneic. Cardiology held IV Lasix yesterday. - Resume IV Lasix - Continue Potassium - Strict I/Os, daily weights, daily BMP     Community acquired pneumonia of bilateral lower lobes and right middle lobe due to Streptoccocus pneumoniae Still requiring >home O2 due to pneumonia, will need to continue antibiotics until more clinical improvement. - Continue Rocephin day 6   - Flutter and IS  Hypertensive crisis BP normal - Continue amlodipine, furoemide, Imdur, metoprolol, PRN hydralazine  Esophageal dysphagia Barium esophagram showed: Persistent narrowing / stenosis of the distal esophagus with failure of the barium tab to pass   EGD 12/16 showed schatzki ring, dilated successfully, no malignant appearing lesions. GI suspect some esophageal dysmotility at well.  Doing well on soft diet. - Consult SLP, appreciate expertise  Hypokalemia - Supplement K  Myocardial  injury Due to CHF, pneumonia.  No ischemia.  AKI (acute kidney injury) ([name] 1.1.  Cr here up to 1.5, 1.4 today improving with diuresis  Depression, major, single episode, moderate ([name]) - Continue Cymbalta  Chronic respiratory failure with hypoxia ([name]) - Continue supplemental O2  COPD (chronic obstructive pulmonary disease) ([name]) - Start Pulmicort - Continue albuterol, make scheduled - Aggressive pulmonary toilet  Morbid obesity ([name]) BMI 40.4  Dyslipidemia, goal LDL below 70 - Continue pravastatin  Essential hypertension See above  CAD S/P percutaneous coronary angioplasty - Continue aspirin, pravastatin, metoprolol  OSA treated with BiPAP - Continue BiPAP at night          Subjective: Very orthpneic.  Very out of breath.  No fever, confusion, vomiting.  Still with a lot of cough and bringing up sputum.       Physical Exam: BP (!) 148/62   Pulse 85   Temp 99.2 F (37.3 C) (Temporal)   Resp (!) 21   Ht 6' 1"  (1.854 m)   Wt (!) 138.7 kg   SpO2 90%   BMI 40.34 kg/m   Obese adult male, lying in bed, no acute distress RRR, no murmurs, heart sounds distant, trace lower extremity edema, likely a lot of edema in his belly, JVP not visible due to body habitus Respiratory rate increased and somewhat labored and out of breath, lung sounds severely diminished, coarse crackles bilaterally Abdomen without grimace to palpation, soft Attention normal, oriented to person, place, and time Generalized weakness, symmetric strength, face symmetric    Data Reviewed: CBC shows worsening leukocytosis Basic metabolic panel shows stable creatinine Chest x-ray shows some increased infiltrates after his  endoscopy Magnesium normal  Family Communication: daughters at the bedside    Disposition: Status is: Inpatient         Author: Edwin Dada, MD 01/21/2022 2:42 PM  For on call review [email or web address].

## 2022-01-21 NOTE — Anesthesia Postprocedure Evaluation (Signed)
Anesthesia Post Note  Patient: [name]  Procedure(s) Performed: ESOPHAGOGASTRODUODENOSCOPY (EGD) WITH PROPOFOL ESOPHAGEAL DILATION     Patient location during evaluation: PACU Anesthesia Type: MAC Level of consciousness: awake and alert Pain management: pain level controlled Vital Signs Assessment: post-procedure vital signs reviewed and stable Respiratory status: spontaneous breathing, nonlabored ventilation, respiratory function stable and patient connected to nasal cannula oxygen Cardiovascular status: stable and blood pressure returned to baseline Postop Assessment: no apparent nausea or vomiting Anesthetic complications: no   No notable events documented.  Last Vitals:  Vitals:   01/21/22 0500 01/21/22 0550  BP: (!) 148/62   Pulse: 87 85  Resp: (!) 28 (!) 21  Temp:  37.3 C  SpO2: (!) 85% 90%    Last Pain:  Vitals:   01/21/22 0550  TempSrc: Temporal  PainSc:                  March Rummage Burnett Lieber

## 2022-01-21 NOTE — Progress Notes (Signed)
Speech Language Pathology Treatment: Dysphagia  Patient Details Name: [name] MRN: [id number] DOB: 30-Nov-1940 Today's Date: 01/21/2022 Time: 4982-6415 SLP Time Calculation (min) (ACUTE ONLY): 17 min  Assessment / Plan / Recommendation Clinical Impression  Pt seen for ongoing dysphagia management.  No s/p EGD with dilation.  Pt and daughter still report difficulty with PO intake, and pt has consumed very little since procedure.  Pt accepted trials of thin liquid by straw, puree, and regular solid.  Pt did not exhibit immediate cough or changes to vocal quality, but there was increased work of breathing.  Pt took large serial straw sips.  Afterwards,  RR increased to 33.  GI with concerns for oropharyngeal dysphagia in addition to esophageal issues.  Recommend further assessment of pharyngeal swallow function.  Will plan for MBS next date.  Recommend continuing current GI soft diet pending results of assessment.     HPI HPI: [name] is a 81 y.o. male who presented to [facility] ED 12/12 with dyspnea. He had experienced intermittent non bilious emesis earlier in the week and on 12/11, he developed fever with Tmax of 101.8.  He apparently has had a steady increase in his weight over the past few weeks, unsure exactly how much.  CXR 12/14: "Bibasilar airspace disease, not significantly changed from prior,  compatible with pneumonia."  Pt seen by outpatient SLP 12/4 with recommendations for MBSS (OP MBS scheduled for 12/20).  Esophagram 12/14 with no passage in distal esophagus.  Pt now s/p EGD with dilation of Schatzki Ring on 12/16.  Pt has a PMH including but not limited to COPD on 4L O2, OSA on CPAP, CHF.      SLP Plan  MBS      Recommendations for follow up therapy are one component of a multi-disciplinary discharge planning process, led by the attending physician.  Recommendations may be updated based on patient status, additional functional criteria and insurance authorization.     Recommendations  Diet recommendations: Regular;Thin liquid Liquids provided via: Cup;Straw Medication Administration: Whole meds with liquid Compensations: Slow rate;Small sips/bites Postural Changes and/or Swallow Maneuvers: Seated upright 90 degrees;Upright 30-60 min after meal                Oral Care Recommendations: Oral care BID Follow Up Recommendations:  (Continue ST at next level) SLP Visit Diagnosis: Dysphagia, unspecified (R13.10) Plan: [place], [place], [place] Office: [phone] 01/21/2022, 12:15 PM

## 2022-01-21 NOTE — Progress Notes (Signed)
Rounding Note    Patient Name: [name] Date of Encounter: 01/21/2022  [place] Cardiologist: Quay Burow, MD   Subjective   Incomplete I/Os, -6.3 L on admission.  Creatinine stable at 1.42.   Continues to have dyspnea  Inpatient Medications    Scheduled Meds:  amLODipine  10 mg Oral Daily   aspirin  81 mg Oral Daily   budesonide (PULMICORT) nebulizer solution  0.25 mg Nebulization BID   Chlorhexidine Gluconate Cloth  6 each Topical Daily   diclofenac Sodium  2 g Topical QID   DULoxetine  60 mg Oral Daily   furosemide  40 mg Intravenous BID   heparin  5,000 Units Subcutaneous Q8H   ipratropium-albuterol  3 mL Nebulization BID   isosorbide mononitrate  60 mg Oral Daily   metoprolol tartrate  25 mg Oral Q12H   pravastatin  40 mg Oral Daily   Continuous Infusions:  cefTRIAXone (ROCEPHIN)  IV 2 g (01/21/22 1527)   PRN Meds: acetaminophen **OR** acetaminophen, albuterol, docusate sodium, hydrALAZINE, polyethylene glycol   Vital Signs    Vitals:   01/21/22 0458 01/21/22 0500 01/21/22 0550 01/21/22 0713  BP: (!) 148/62 (!) 148/62    Pulse:  87 85   Resp:  (!) 28 (!) 21   Temp:   99.2 F (37.3 C)   TempSrc:   Temporal   SpO2:  (!) 85% 90% 90%  Weight:   (!) 138.7 kg   Height:        Intake/Output Summary (Last 24 hours) at 01/21/2022 1624 Last data filed at 01/21/2022 0558 Gross per 24 hour  Intake --  Output 850 ml  Net -850 ml       01/21/2022    5:50 AM 01/20/2022   10:24 AM 01/19/2022    4:34 AM  Last 3 Weights  Weight (lbs) 305 lb 12.5 oz 306 lb 14.1 oz 306 lb 14.1 oz  Weight (kg) 138.7 kg 139.2 kg 139.2 kg      Telemetry    NSR - Personally Reviewed  ECG    No new ECG - Personally Reviewed  Physical Exam   GEN: No acute distress.   Neck: No JVD Cardiac: RRR, no murmurs, rubs, or gallops.  Respiratory: Expiratory wheezing, basilar crackles GI: Soft, nontender, non-distended  MS: No edema Neuro:  Nonfocal   Psych: Normal affect   Labs    High Sensitivity Troponin:   Recent Labs  Lab 01/16/22 1831 01/16/22 2015 01/17/22 0231  TROPONINIHS 402* 540* 451*      Chemistry Recent Labs  Lab 01/16/22 1831 01/16/22 1858 01/17/22 0231 01/18/22 0426 01/19/22 0419 01/20/22 0259 01/21/22 0243  NA 140   < > 139 140 140 140 137  K 3.7   < > 3.4* 3.4* 3.4* 3.6 3.6  CL 105  --  104 103 100 101 100  CO2 23  --  23 27 28 28 28   GLUCOSE 126*  --  163* 118* 123* 127* 139*  BUN 20  --  19 22 22 22  30*  CREATININE 1.40*  --  1.51* 1.50* 1.40* 1.39* 1.42*  CALCIUM 10.0  --  9.3 9.7 9.9 10.0 9.8  MG  --   --  1.5* 2.2  --   --  2.2  PROT 7.3  --   --   --   --   --   --   ALBUMIN 3.1*  --   --   --   --   --   --  AST 43*  --   --   --   --   --   --   ALT 38  --   --   --   --   --   --   ALKPHOS 88  --   --   --   --   --   --   BILITOT 0.9  --   --   --   --   --   --   GFRNONAA 50*  --  46* 46* 50* 50* 49*  ANIONGAP 12  --  12 10 12 11 9    < > = values in this interval not displayed.     Lipids No results for input(s): "CHOL", "TRIG", "HDL", "LABVLDL", "LDLCALC", "CHOLHDL" in the last 168 hours.  Hematology Recent Labs  Lab 01/19/22 0419 01/20/22 0259 01/21/22 0243  WBC 10.7* 12.2* 14.8*  RBC 4.48 4.61 4.34  HGB 13.5 13.9 13.0  HCT 41.4 42.0 40.0  MCV 92.4 91.1 92.2  MCH 30.1 30.2 30.0  MCHC 32.6 33.1 32.5  RDW 13.7 13.8 13.7  PLT 262 270 265    Thyroid No results for input(s): "TSH", "FREET4" in the last 168 hours.  BNP Recent Labs  Lab 01/16/22 1831 01/17/22 0231  BNP 192.4* 159.1*     DDimer No results for input(s): "DDIMER" in the last 168 hours.   Radiology    DG CHEST PORT 1 VIEW  Result Date: 01/20/2022 CLINICAL DATA:  Sputum production EXAM: PORTABLE CHEST 1 VIEW COMPARISON:  January 18, 2022 chest x-ray FINDINGS: No pneumothorax. Stable cardiomegaly. The hila and mediastinum are unchanged. Mild bibasilar opacities are stable. No other interval changes.  IMPRESSION: Mild bibasilar opacities may represent atelectasis or developing infiltrates. Recommend attention on follow-up. Electronically Signed   By: Dorise Bullion III M.D.   On: 01/20/2022 11:44    Cardiac Studies     Patient Profile     81 y.o. male hx of CAD, COPD on 4LNC, OSA, Dementia and HFpEF who is being seen  for the evaluation of SOB  Assessment & Plan    Acute on chronic diastolic heart failure: echo with EF 55-60%, moderate LVH, indeterminate diastolic function.   -No LE edema or JVD appreciated, but does have basilar crackles and remains with significant O2 requirement.  Would continue IV lasix for now  Acute on chronic respiratory failure with hypoxia: likely multifactorial due to CAP, acute on chronic diastolic heart failure, COPD. -Diuresis as above -Abx per primary team  Hypertensive urgency: required Nitro gtt, now with improvement in pressures and transitioned to PO. On amlodpine and imdur and metoprolol  Elevated troponin: likely demand ischemia in setting of acute illness.  Peaked 540  Esophageal stricture: noted on esophagram, underwent an EGD today which showed lower esophageal ring which was dilated  AKI: monitor with diuresis  HLD: on statin  For questions or updates, please contact [place] Please consult [email or web address] for contact info under        Signed, Donato Heinz, MD  01/21/2022, 4:24 PM

## 2022-01-21 NOTE — Progress Notes (Incomplete)
Rounding Note    Patient Name: [name] Date of Encounter: 01/22/2022  [place] Cardiologist: Quay Burow, MD   Subjective   Feels better today. Breathing slowly improving. No chest pain.  Net negative 1.4L Wt 305>302lbs Remains on 6L [place]  Inpatient Medications    Scheduled Meds:  amLODipine  10 mg Oral Daily   aspirin  81 mg Oral Daily   budesonide (PULMICORT) nebulizer solution  0.25 mg Nebulization BID   carvedilol  12.5 mg Oral BID WC   Chlorhexidine Gluconate Cloth  6 each Topical Daily   diclofenac Sodium  2 g Topical QID   DULoxetine  60 mg Oral Daily   furosemide  40 mg Intravenous BID   heparin  5,000 Units Subcutaneous Q8H   ipratropium-albuterol  3 mL Nebulization BID   isosorbide mononitrate  60 mg Oral Daily   pravastatin  40 mg Oral Daily   Continuous Infusions:  cefTRIAXone (ROCEPHIN)  IV Stopped (01/21/22 1558)   potassium chloride 10 mEq (01/22/22 0939)   PRN Meds: acetaminophen **OR** acetaminophen, albuterol, docusate sodium, hydrALAZINE, polyethylene glycol   Vital Signs    Vitals:   01/21/22 2045 01/22/22 0500 01/22/22 0831 01/22/22 0842  BP: (!) 134/54  (!) 151/63   Pulse: 89  87   Resp: 19  18   Temp: 98.6 F (37 C)  98.3 F (36.8 C)   TempSrc: Oral  Oral   SpO2: (!) 88%  (!) 88% 93%  Weight:  (!) 137.2 kg    Height:        Intake/Output Summary (Last 24 hours) at 01/22/2022 1053 Last data filed at 01/22/2022 1036 Gross per 24 hour  Intake 100.06 ml  Output 2400 ml  Net -2299.94 ml      01/22/2022    5:00 AM 01/21/2022    5:50 AM 01/20/2022   10:24 AM  Last 3 Weights  Weight (lbs) 302 lb 7.5 oz 305 lb 12.5 oz 306 lb 14.1 oz  Weight (kg) 137.2 kg 138.7 kg 139.2 kg      Telemetry    NSR, occasional PVCs - Personally Reviewed  ECG    No new ECG today - Personally Reviewed  Physical Exam   GEN: No acute distress.  Sitting in a chair Neck: No JVD Cardiac: RRR, no murmurs, rubs, or  gallops.  Respiratory: Bibasilar crackles with diffuse expiratory wheezing GI: Soft, nontender, non-distended  MS: No edema Neuro:  Nonfocal  Psych: Normal affect   Labs    High Sensitivity Troponin:   Recent Labs  Lab 01/16/22 1831 01/16/22 2015 01/17/22 0231  TROPONINIHS 402* 540* 451*     Chemistry Recent Labs  Lab 01/16/22 1831 01/16/22 1858 01/18/22 0426 01/19/22 0419 01/20/22 0259 01/21/22 0243 01/22/22 0429  NA 140   < > 140   < > 140 137 136  K 3.7   < > 3.4*   < > 3.6 3.6 3.4*  CL 105   < > 103   < > 101 100 98  CO2 23   < > 27   < > 28 28 30   GLUCOSE 126*   < > 118*   < > 127* 139* 138*  BUN 20   < > 22   < > 22 30* 30*  CREATININE 1.40*   < > 1.50*   < > 1.39* 1.42* 1.33*  CALCIUM 10.0   < > 9.7   < > 10.0 9.8 10.3  MG  --    < >  2.2  --   --  2.2 2.3  PROT 7.3  --   --   --   --   --  6.6  ALBUMIN 3.1*  --   --   --   --   --  2.4*  AST 43*  --   --   --   --   --  32  ALT 38  --   --   --   --   --  27  ALKPHOS 88  --   --   --   --   --  90  BILITOT 0.9  --   --   --   --   --  0.6  GFRNONAA 50*   < > 46*   < > 50* 49* 53*  ANIONGAP 12   < > 10   < > 11 9 8    < > = values in this interval not displayed.    Lipids No results for input(s): "CHOL", "TRIG", "HDL", "LABVLDL", "LDLCALC", "CHOLHDL" in the last 168 hours.  Hematology Recent Labs  Lab 01/20/22 0259 01/21/22 0243 01/22/22 0429  WBC 12.2* 14.8* 12.0*  RBC 4.61 4.34 4.52  HGB 13.9 13.0 13.8  HCT 42.0 40.0 41.9  MCV 91.1 92.2 92.7  MCH 30.2 30.0 30.5  MCHC 33.1 32.5 32.9  RDW 13.8 13.7 13.4  PLT 270 265 243   Thyroid No results for input(s): "TSH", "FREET4" in the last 168 hours.  BNP Recent Labs  Lab 01/16/22 1831 01/17/22 0231  BNP 192.4* 159.1*    DDimer No results for input(s): "DDIMER" in the last 168 hours.   Radiology    DG CHEST PORT 1 VIEW  Result Date: 01/20/2022 CLINICAL DATA:  Sputum production EXAM: PORTABLE CHEST 1 VIEW COMPARISON:  January 18, 2022 chest  x-ray FINDINGS: No pneumothorax. Stable cardiomegaly. The hila and mediastinum are unchanged. Mild bibasilar opacities are stable. No other interval changes. IMPRESSION: Mild bibasilar opacities may represent atelectasis or developing infiltrates. Recommend attention on follow-up. Electronically Signed   By: Dorise Bullion III M.D.   On: 01/20/2022 11:44    Cardiac Studies   TTE 01/17/22: IMPRESSIONS     1. Left ventricular ejection fraction, by estimation, is 55 to 60%. The  left ventricle has normal function. The left ventricle has no regional  wall motion abnormalities. There is moderate concentric left ventricular  hypertrophy. Indeterminate diastolic  filling due to E-A fusion.   2. Right ventricular systolic function was not well visualized. The right  ventricular size is not well visualized.   3. The mitral valve was not well visualized. No evidence of mitral valve  regurgitation.   4. The aortic valve was not well visualized. Aortic valve regurgitation  is not visualized. No aortic stenosis is present.   5. Aortic dilatation noted. There is mild dilatation of the aortic root,  measuring 41 mm.   6. The inferior vena cava is normal in size with greater than 50%  respiratory variability, suggesting right atrial pressure of 3 mmHg.   Comparison(s): No significant change from prior study.   Patient Profile     81 y.o. male hx of CAD, COPD on 4LNC, OSA, Dementia and HFpEF who presented with worsening SOB, fever and LE edema found to have multifocal pneumonia and acute diastolic HF. Cardiology is consulted regarding management of acute diastolic HF.  Assessment & Plan    #Acute on chronic diastolic heart failure:  Patient presented with respiratory distress  found to have multifocal pneumonia and acute on chronic diastolic HF exacerbation. TTE with EF 55-60%, moderate LVH, indeterminate diastolic function. Has been responding well to lasix IV BID. Cr stable today. Continues to  require significant O2; will continue with IV diuresis today and possibly transition to PO tomorrow. -Continue lasix 81m IV BID; likely plan to transition to PO tomorrow -Add spiro as able -Trend I/Os and daily weights -BP control as below  #Acute on chronic respiratory failure with hypoxia:  Likely multifactorial due to CAP, acute on chronic diastolic heart failure, COPD. -Diuresis as above -Abx per primary team  #Hypertensive urgency:  Initially required Nitro gtt, now with improvement in pressures and transitioned to PO. -Continue amlodipine 1467mdailyu -Change metop to coreg 12.67m57mID -Continue imdur 39m70mily  #Elevated troponin:  Likely demand ischemia in setting of acute illness.  Peaked 540. Will continue with medical management as above. -Continue ASA 81mg39mly -Continue pravastatin 40mg 11my  #Esophageal stricture:  Noted on esophagram, underwent an EGD 12/17 which showed lower esophageal ring which was dilated  #AKI:  -Monitor with diuresis  #HLD:  -On pravastatin 40mg d63m  For questions or updates, please contact [facility] consult [email or web address] for contact info under        Signed, HeatherFreada Bergeron2/18/2023, 10:53 AM

## 2022-01-21 NOTE — Evaluation (Signed)
Physical Therapy Evaluation  Patient Details Name: [name] MRN: [id number] DOB: November 14, 1940 Today's Date: 01/21/2022  History of Present Illness  Pt is an 81 y/o male who presents 01/16/22 with SOB, cough, choking on food at home. He was found to have PNA and then respiratory failure. Pt underwent MBS and EGD during admission as well. PMH significant for Ankylosing spondylitis, CAD, CHF, HTN, COPD.   Clinical Impression  Pt admitted with above diagnosis. Pt currently with functional limitations due to the deficits listed below (see PT Problem List). At the time of PT eval pt was able to perform sit<>stand transfers with the [facility] and +2 assist for optimal safety. Pt lethargic, having difficulty maintaining eyes open during session. Pt's daughter present and provided history. Based on performance today, feel SNF level rehab would be the most appropriate d/c disposition to maximize functional independence and safety prior to return  home with family and aide support. Acutely, pt will benefit from skilled PT to increase their independence and safety with mobility to allow discharge to the venue listed below.          Recommendations for follow up therapy are one component of a multi-disciplinary discharge planning process, led by the attending physician.  Recommendations may be updated based on patient status, additional functional criteria and insurance authorization.  Follow Up Recommendations Skilled nursing-short term rehab (<3 hours/day) Can patient physically be transported by private vehicle: No    Assistance Recommended at Discharge Frequent or constant Supervision/Assistance  Patient can return home with the following  Two people to help with walking and/or transfers;A lot of help with bathing/dressing/bathroom;Assistance with cooking/housework;Assist for transportation;Help with stairs or ramp for entrance    Equipment Recommendations Other (comment) (TBD by next venue of  care)  Recommendations for Other Services       Functional Status Assessment Patient has had a recent decline in their functional status and demonstrates the ability to make significant improvements in function in a reasonable and predictable amount of time.     Precautions / Restrictions Precautions Precautions: Fall Restrictions Weight Bearing Restrictions: No      Mobility  Bed Mobility               General bed mobility comments: Pt was received sitting up in the recliner.    Transfers Overall transfer level: Needs assistance Equipment used: Ambulation equipment used Transfers: Sit to/from Stand Sit to Stand: Mod assist, +2 physical assistance           General transfer comment: Stedy utilized for sit<>stand at recliner. Pt required +2 assist for boost to standing and to improve posture to full stand.    Ambulation/Gait               General Gait Details: Unable to tolerate progression to ambulation at this time.  Stairs            Wheelchair Mobility    Modified Rankin (Stroke Patients Only)       Balance Overall balance assessment: Needs assistance Sitting-balance support: Feet supported, No upper extremity supported Sitting balance-Leahy Scale: Fair     Standing balance support: During functional activity Standing balance-Leahy Scale: Poor Standing balance comment: Reliant on UE support                             Pertinent Vitals/Pain Pain Assessment Pain Assessment: Faces Faces Pain Scale: Hurts little more Pain Location: L foot Pain Descriptors /  Indicators: Discomfort, Grimacing Pain Intervention(s): Limited activity within patient's tolerance, Monitored during session, Repositioned    Home Living Family/patient expects to be discharged to:: Private residence Living Arrangements: Children Available Help at Discharge: Family;Personal care attendant Type of Home: House Home Access: Stairs to enter   [employer] of Steps: 1   Home Layout: One level Home Equipment: Shower seat - built Medical sales representative (2 wheels);Rollator (4 wheels) (Adjustable bed, lift chair) Additional Comments: PCA 4 hours a day 6 days a week    Prior Function Prior Level of Function : Needs assist             Mobility Comments: Uses the rollator in the house ADLs Comments: Aide assists with ADL's     Hand Dominance   Dominant Hand: Right    Extremity/Trunk Assessment   Upper Extremity Assessment Upper Extremity Assessment: Defer to OT evaluation    Lower Extremity Assessment Lower Extremity Assessment: LLE deficits/detail LLE Deficits / Details: Pt with swelling, redness in foot. Daughter reports foot was "fine" before he went down for EGD and when he returned foot was swollen, red and painful. Staff aware - noted in chart.    Cervical / Trunk Assessment Cervical / Trunk Assessment: Other exceptions Cervical / Trunk Exceptions: Prior cervical surgery. Overall poor posture.  Communication      Cognition Arousal/Alertness: Awake/alert Behavior During Therapy: WFL for tasks assessed/performed Overall Cognitive Status: Difficult to assess                                          General Comments      Exercises     Assessment/Plan    PT Assessment Patient needs continued PT services  PT Problem List Decreased strength;Decreased activity tolerance;Decreased range of motion;Decreased balance;Decreased mobility;Decreased knowledge of use of DME;Decreased safety awareness;Decreased knowledge of precautions;Pain       PT Treatment Interventions DME instruction;Gait training;Stair training;Functional mobility training;Therapeutic activities;Therapeutic exercise;Balance training;Patient/family education    PT Goals (Current goals can be found in the Care Plan section)  Acute Rehab PT Goals Patient Stated Goal: Return home PT Goal Formulation: With patient/family Time For  Goal Achievement: 02/04/22 Potential to Achieve Goals: Good    Frequency Min 2X/week     Co-evaluation               AM-PAC PT "6 Clicks" Mobility  Outcome Measure Help needed turning from your back to your side while in a flat bed without using bedrails?: A Lot Help needed moving from lying on your back to sitting on the side of a flat bed without using bedrails?: A Lot Help needed moving to and from a bed to a chair (including a wheelchair)?: Total Help needed standing up from a chair using your arms (e.g., wheelchair or bedside chair)?: Total Help needed to walk in hospital room?: Total Help needed climbing 3-5 steps with a railing? : Total 6 Click Score: 8    End of Session Equipment Utilized During Treatment: Gait belt;Oxygen Activity Tolerance: Patient limited by fatigue;Patient limited by lethargy Patient left: in chair;with call bell/phone within reach;with chair alarm set;with family/visitor present Nurse Communication: Mobility status PT Visit Diagnosis: Unsteadiness on feet (R26.81);Pain Pain - Right/Left: Left Pain - part of body: Ankle and joints of foot    Time: 9024-0973 PT Time Calculation (min) (ACUTE ONLY): 30 min   Charges:   PT Evaluation $  PT Eval Moderate Complexity: 1 Mod PT Treatments $Gait Training: 8-22 mins        Rolinda Roan, PT, DPT Acute Rehabilitation Services Secure Chat Preferred Office: [phone]   Thelma Comp 01/21/2022, 4:02 PM

## 2022-01-22 ENCOUNTER — Inpatient Hospital Stay (HOSPITAL_COMMUNITY): Payer: Medicare Other

## 2022-01-22 ENCOUNTER — Encounter (HOSPITAL_COMMUNITY): Payer: Self-pay | Admitting: Gastroenterology

## 2022-01-22 DIAGNOSIS — I5033 Acute on chronic diastolic (congestive) heart failure: Secondary | ICD-10-CM | POA: Diagnosis not present

## 2022-01-22 DIAGNOSIS — J9611 Chronic respiratory failure with hypoxia: Secondary | ICD-10-CM | POA: Diagnosis not present

## 2022-01-22 DIAGNOSIS — I169 Hypertensive crisis, unspecified: Secondary | ICD-10-CM | POA: Diagnosis not present

## 2022-01-22 DIAGNOSIS — E785 Hyperlipidemia, unspecified: Secondary | ICD-10-CM

## 2022-01-22 DIAGNOSIS — J189 Pneumonia, unspecified organism: Secondary | ICD-10-CM | POA: Diagnosis not present

## 2022-01-22 DIAGNOSIS — J9621 Acute and chronic respiratory failure with hypoxia: Secondary | ICD-10-CM | POA: Diagnosis not present

## 2022-01-22 DIAGNOSIS — I2510 Atherosclerotic heart disease of native coronary artery without angina pectoris: Secondary | ICD-10-CM | POA: Diagnosis not present

## 2022-01-22 LAB — COMPREHENSIVE METABOLIC PANEL
ALT: 27 U/L (ref 0–44)
AST: 32 U/L (ref 15–41)
Albumin: 2.4 g/dL — ABNORMAL LOW (ref 3.5–5.0)
Alkaline Phosphatase: 90 U/L (ref 38–126)
Anion gap: 8 (ref 5–15)
BUN: 30 mg/dL — ABNORMAL HIGH (ref 8–23)
CO2: 30 mmol/L (ref 22–32)
Calcium: 10.3 mg/dL (ref 8.9–10.3)
Chloride: 98 mmol/L (ref 98–111)
Creatinine, Ser: 1.33 mg/dL — ABNORMAL HIGH (ref 0.61–1.24)
GFR, Estimated: 53 mL/min — ABNORMAL LOW (ref >60)
Glucose, Bld: 138 mg/dL — ABNORMAL HIGH (ref 70–99)
Potassium: 3.4 mmol/L — ABNORMAL LOW (ref 3.5–5.1)
Sodium: 136 mmol/L (ref 135–145)
Total Bilirubin: 0.6 mg/dL (ref 0.3–1.2)
Total Protein: 6.6 g/dL (ref 6.5–8.1)

## 2022-01-22 LAB — CBC
HCT: 41.9 % (ref 39.0–52.0)
Hemoglobin: 13.8 g/dL (ref 13.0–17.0)
MCH: 30.5 pg (ref 26.0–34.0)
MCHC: 32.9 g/dL (ref 30.0–36.0)
MCV: 92.7 fL (ref 80.0–100.0)
Platelets: 243 10*3/uL (ref 150–400)
RBC: 4.52 MIL/uL (ref 4.22–5.81)
RDW: 13.4 % (ref 11.5–15.5)
WBC: 12 10*3/uL — ABNORMAL HIGH (ref 4.0–10.5)
nRBC: 0 % (ref 0.0–0.2)

## 2022-01-22 LAB — MAGNESIUM: Magnesium: 2.3 mg/dL (ref 1.7–2.4)

## 2022-01-22 LAB — CULTURE, BLOOD (ROUTINE X 2)
Culture: NO GROWTH
Special Requests: ADEQUATE

## 2022-01-22 MED ORDER — SODIUM CHLORIDE 0.9 % IV SOLN
2.0000 g | INTRAVENOUS | Status: DC
  Administered 2022-01-24: 2 g via INTRAVENOUS
  Filled 2022-01-22 (×3): qty 20

## 2022-01-22 MED ORDER — CARVEDILOL 12.5 MG PO TABS
12.5000 mg | ORAL_TABLET | Freq: Two times a day (BID) | ORAL | Status: DC
  Administered 2022-01-22 – 2022-01-26 (×8): 12.5 mg via ORAL
  Filled 2022-01-22 (×9): qty 1

## 2022-01-22 MED ORDER — FUROSEMIDE 10 MG/ML IJ SOLN
40.0000 mg | Freq: Two times a day (BID) | INTRAMUSCULAR | Status: AC
  Administered 2022-01-22 (×2): 40 mg via INTRAVENOUS
  Filled 2022-01-22 (×2): qty 4

## 2022-01-22 MED ORDER — POTASSIUM CHLORIDE 10 MEQ/100ML IV SOLN
10.0000 meq | INTRAVENOUS | Status: AC
  Administered 2022-01-22: 10 meq via INTRAVENOUS
  Filled 2022-01-22: qty 100

## 2022-01-22 MED ORDER — POTASSIUM CHLORIDE 10 MEQ/100ML IV SOLN
10.0000 meq | INTRAVENOUS | Status: AC
  Administered 2022-01-22 (×2): 10 meq via INTRAVENOUS
  Filled 2022-01-22 (×2): qty 100

## 2022-01-22 NOTE — Progress Notes (Signed)
Heart Failure Navigator Progress Note  Assessed for Heart & Vascular TOC clinic readiness.  Patient does not meet criteria due to per MD note Dementia and EF 55-60% HFpEF.   Navigator will sign off at this time .  Earnestine Leys, BSN, Clinical cytogeneticist Only

## 2022-01-22 NOTE — TOC Initial Note (Signed)
Transition of Care [facility]) - Initial/Assessment Note    Patient Details  Name: [name] MRN: [id number] Date of Birth: Mar 27, 1940  Transition of Care [facility]) CM/SW Contact:    Beckey Rutter, MSW, [place] Phone Number: 01/22/2022, 2:40 PM  Clinical Narrative:  CSW spoke with daughters via phone. Pt is from home and lives with daughter and son-in law. Pt does have DME at home. Daughters would like for him to go to SNF to get better. However, pt daughters want him home for christmas. If facility won't allow pt to go home for christmas, the family will prefer to take him home with home health.            Expected Discharge Plan: Skilled Nursing Facility Barriers to Discharge: Continued Medical Work up        Expected Discharge Plan and Services Expected Discharge Plan: [place]                                              Prior Living Arrangements/Services   Lives with:: Adult Children Patient language and need for interpreter reviewed:: Yes Do you feel safe going back to the place where you live?: Yes      Need for Family Participation in Patient Care: Yes (Comment) Care giver support system in place?: Yes (comment) Current home services: DME Criminal Activity/Legal Involvement Pertinent to Current Situation/Hospitalization: No - Comment as needed  Activities of Daily Living Home Assistive Devices/Equipment: Walker (specify type) ADL Screening (condition at time of admission) Patient's cognitive ability adequate to safely complete daily activities?: Yes Is the patient deaf or have difficulty hearing?: No Does the patient have difficulty seeing, even when wearing glasses/contacts?: No Does the patient have difficulty concentrating, remembering, or making decisions?: Yes Patient able to express need for assistance with ADLs?: Yes Does the patient have difficulty dressing or bathing?: Yes Independently performs ADLs?: No Communication:  Independent Dressing (OT): Needs assistance Is this a change from baseline?: Pre-admission baseline Grooming: Needs assistance Is this a change from baseline?: Pre-admission baseline Feeding: Independent Bathing: Needs assistance Is this a change from baseline?: Pre-admission baseline Toileting: Needs assistance Is this a change from baseline?: Pre-admission baseline In/Out Bed: Needs assistance Is this a change from baseline?: Pre-admission baseline Walks in Home: Needs assistance Is this a change from baseline?: Pre-admission baseline Does the patient have difficulty walking or climbing stairs?: Yes Weakness of Legs: Both Weakness of Arms/Hands: Both  Permission Sought/Granted      Share Information with NAME: [name]     Permission granted to share info w Relationship: Daughter  Permission granted to share info w Contact Information: [phone]  Emotional Assessment       Orientation: : Oriented to Self, Oriented to Place, Oriented to  Time   Psych Involvement: No (comment)  Admission diagnosis:  Nephrolithiasis [N20.0] Acute pulmonary edema ([place]) [J81.0] CAP (community acquired pneumonia) [J18.9] Calculus of gallbladder without cholecystitis without obstruction [K80.20] Community acquired pneumonia, unspecified laterality [J18.9] Acute on chronic congestive heart failure, unspecified heart failure type [facility]) [I50.9] Patient Active Problem List   Diagnosis Date Noted   AKI (acute kidney injury) ([place]) 01/19/2022   Myocardial injury 01/19/2022   Hypokalemia 01/19/2022   Hypertensive crisis 01/18/2022   Community acquired pneumonia of bilateral lower lobes and right middle lobe due to Streptoccocus pneumoniae 01/17/2022   Acute on chronic diastolic  heart failure ([place]) 01/17/2022   Depression, major, single episode, moderate ([place]) 02/20/2021   Hematochezia 01/26/2021   Syncope 10/29/2020   Class 3 obesity ([place]) 10/29/2020   Head concussion 10/29/2020   Bilateral  hand fractures 10/29/2020   Esophageal dysphagia 07/19/2020   Status post cervical spinal fusion 07/19/2020   Cervical spondylosis 03/07/2020   DNR (do not resuscitate) 08/17/2019   Shortness of breath 04/09/2019   COPD (chronic obstructive pulmonary disease) ([place]) 04/09/2019   Chronic ulcerative proctosigmoiditis, without complications ([place]) 66/07/43   Chronic respiratory failure with hypoxia ([place]) 04/09/2019   Obesity with body mass index (BMI) of 30.0 to 39.9 04/09/2019   Acute on chronic respiratory failure with hypoxia and hypercapnia ([place]) 12/23/2018   Acute on chronic hypoxic respiratory failure ([place]) 12/21/2018   Ankylosing spondylitis ([place]) 11/07/2018   Disseminated idiopathic skeletal hyperostosis 10/22/2018   Neck pain 10/22/2018   Rectal bleeding 08/26/2018   Chronic diastolic CHF (congestive heart failure) ([place]) 03/27/2018   Exertional dyspnea 03/15/2018   Morbid obesity ([place]) 03/12/2018   History of carpal tunnel surgery of left wrist with unlar head resection 01/17/17 01/24/2017   Closed fracture of distal radius and ulna, left, with malunion, subsequent encounter    Carpal tunnel syndrome of left wrist    Fracture, Colles, left, closed 11/29/2016   Hx of skin cancer, basal cell 11/30/2015   Erectile dysfunction 06/06/2015   Iron deficiency 08/18/2014   Fecal incontinence 02/10/2014   Essential hypertension 01/09/2013   Dyslipidemia, goal LDL below 70 01/09/2013   Chest pain at rest 12/14/2012   CAD S/P percutaneous coronary angioplasty 12/14/2012   Hiatal hernia 12/14/2012   GERD (gastroesophageal reflux disease) 12/14/2012   Prediabetes 12/09/2012   BPH (benign prostatic hyperplasia) 11/18/2012   RUQ pain 09/04/2012   Gallstones 05/09/2012   OSA treated with BiPAP 04/30/2012   Ulcerative colitis ([place]) 04/29/2012   Ulcerative colitis, left sided ([place]) 12/26/2011   Diarrhea 10/18/2010   PCP:  [name], MD Pharmacy:   [facility] [place], [address] [place] [address] Phone: [phone] Fax: [phone]     Social Determinants of Health (SDOH) Interventions    Readmission Risk Interventions     No data to display         Beckey Rutter, MSW, LCSWA, LCASA Transitions of Care  Clinical Social Worker I

## 2022-01-22 NOTE — Progress Notes (Signed)
Progress Note   Patient: [name] NOM:[id number] DOB: Jun 11, 1940 DOA: 01/16/2022     5 DOS: the patient was seen and examined on 01/22/2022 at 12:05PM      Brief hospital course: [name] is an 81 y.o. M with COPD and dCHF and morbid obesity BMI 40.4 with chronic respiratory failure on 4L home O2, HTN, OSA on BiPAP, HLD and CAD s/p PCI >21yr ulcerative colitis not on therapy who presented with dyspnea, fever and lower extremity swelling.  CTA in the ER showed bilateral lower lobe and RML airspace opacities. In respiratory distress requiring BiPAP on admission to ICU with antibiotics/diuretics.    12/13: Admitted on antibiotics, diuretics 12/14: Urine strep antigen positive; barium esophagram shows narrowing, GI consulted 12/16: EGD with schatzki ring, dilated successully     Assessment and Plan: * Acute on chronic hypoxic respiratory failure ([name]) Increased up to 10L.  Multifactorial from CHF, pneumonia, atelectasis given obesity.    Acute on chronic diastolic heart failure ([name]) Net negative 1.3L Cr stable, K low - Contineu IV Lasix - Continue Potassium - Strict I/Os, daily weights, daily BMP     Community acquired pneumonia of bilateral lower lobes and right middle lobe due to Streptoccocus pneumoniae Still requiring >home O2 due to pneumonia, will need to continue antibiotics until more clinical improvement. - Continue Rocephin day 7 - Flutter and IS, mobilization  Hypertensive crisis BP normal - Continue amlodipine, furoemide, Imdur, metoprolol, PRN hydralazine  Esophageal dysphagia Barium esophagram showed: Persistent narrowing / stenosis of the distal esophagus with failure of the barium tab to pass   EGD 12/16 showed schatzki ring, dilated successfully, no malignant appearing lesions. GI suspect some esophageal dysmotility at well.  Doing well on soft diet. - Consult SLP, appreciate expertise  Hypokalemia - Supplement K  Myocardial  injury Due to CHF, pneumonia.  No ischemia.  AKI (acute kidney injury) ([place] Baseline 1.1.  Cr here up to 1.5, improving again to 1.3 with diuresis  Depression, major, single episode, moderate ([name]) - Continue Cymbalta  Chronic respiratory failure with hypoxia ([name]) - Continue supplemental O2  COPD (chronic obstructive pulmonary disease) ([name]) I suspect some of his hypoxia is bronchospastic/inflammatory - Continue new Pulmicort - Continue scheduled and PRN albuterol  - Aggressive pulmonary toilet  Morbid obesity ([name]) BMI 40.4  Dyslipidemia, goal LDL below 70 - Continue pravastatin  Essential hypertension See above  CAD S/P percutaneous coronary angioplasty - Continue aspirin, pravastatin, metoprolol  OSA treated with BiPAP - Continue BiPAP at night          Subjective: Hungry.  No fever, no confusion.  Still coughing and still out of rbeath and still desaturates with minimla exertion.  Chokes on food from time to time.       Physical Exam: BP (!) 146/71 (BP Location: Right Wrist)   Pulse 85   Temp 100.2 F (37.9 C) (Oral)   Resp 18   Ht 6' 1"  (1.854 m)   Wt (!) 137.2 kg   SpO2 93%   BMI 39.91 kg/m   Obese adult male, recliner, no acute distress RRR, no murmurs, heart sounds distant, no peripheral edema JVP visible due to body habitus Respiratory rate normal, lung sounds diminished, no rales or wheezes appreciated, diminished at the bases Abdomen soft without grimace to palpation Attention normal, oriented to person, place, time, and situation    Data Reviewed: Patient metabolic panel shows creatinine 1.3, slightly improved from yesterday Magnesium normal Potassium 3.4 White blood cell count  down to 12     Family Communication: Daughter at the bedside    Disposition: Status is: Inpatient         Author: Edwin Dada, MD 01/22/2022 4:29 PM  For on call review [email or web address].

## 2022-01-22 NOTE — NC FL2 (Addendum)
[place] LEVEL OF CARE FORM     IDENTIFICATION  Patient Name: [name] Birthdate: 24-Dec-1940 Sex: male Admission Date (Current Location): 01/16/2022  [facility] and [place] Number:  Herbalist and Address:         Provider Number: [id number]  Attending Physician Name and Address:  Edwin Dada, *  Relative Name and Phone Number:  [name] [phone]    Current Level of Care: Hospital Recommended Level of Care: [place] Prior Approval Number:    Date Approved/Denied:   PASRR Number: [id number]  Discharge Plan: SNF    Current Diagnoses: Patient Active Problem List   Diagnosis Date Noted   AKI (acute kidney injury) ([place]) 01/19/2022   Myocardial injury 01/19/2022   Hypokalemia 01/19/2022   Hypertensive crisis 01/18/2022   Community acquired pneumonia of bilateral lower lobes and right middle lobe due to Streptoccocus pneumoniae 01/17/2022   Acute on chronic diastolic heart failure ([place]) 01/17/2022   Depression, major, single episode, moderate ([place]) 02/20/2021   Hematochezia 01/26/2021   Syncope 10/29/2020   Class 3 obesity ([place]) 10/29/2020   Head concussion 10/29/2020   Bilateral hand fractures 10/29/2020   Esophageal dysphagia 07/19/2020   Status post cervical spinal fusion 07/19/2020   Cervical spondylosis 03/07/2020   DNR (do not resuscitate) 08/17/2019   Shortness of breath 04/09/2019   COPD (chronic obstructive pulmonary disease) ([place]) 04/09/2019   Chronic ulcerative proctosigmoiditis, without complications ([place]) 76/80/8811   Chronic respiratory failure with hypoxia ([place]) 04/09/2019   Obesity with body mass index (BMI) of 30.0 to 39.9 04/09/2019   Acute on chronic respiratory failure with hypoxia and hypercapnia ([place]) 12/23/2018   Acute on chronic hypoxic respiratory failure ([place]) 12/21/2018   Ankylosing spondylitis ([place]) 11/07/2018   Disseminated idiopathic skeletal hyperostosis 10/22/2018   Neck  pain 10/22/2018   Rectal bleeding 08/26/2018   Chronic diastolic CHF (congestive heart failure) ([place]) 03/27/2018   Exertional dyspnea 03/15/2018   Morbid obesity ([place]) 03/12/2018   History of carpal tunnel surgery of left wrist with unlar head resection 01/17/17 01/24/2017   Closed fracture of distal radius and ulna, left, with malunion, subsequent encounter    Carpal tunnel syndrome of left wrist    Fracture, Col[place], left, closed 11/29/2016   Hx of skin cancer, basal cell 11/30/2015   Erectile dysfunction 06/06/2015   Iron deficiency 08/18/2014   Fecal incontinence 02/10/2014   Essential hypertension 01/09/2013   Dyslipidemia, goal LDL below 70 01/09/2013   Chest pain at rest 12/14/2012   CAD S/P percutaneous coronary angioplasty 12/14/2012   Hiatal hernia 12/14/2012   GERD (gastroesophageal reflux disease) 12/14/2012   Prediabetes 12/09/2012   BPH (benign prostatic hyperplasia) 11/18/2012   RUQ pain 09/04/2012   Gallstones 05/09/2012   OSA treated with BiPAP 04/30/2012   Ulcerative colitis ([place]) 04/29/2012   Ulcerative colitis, left sided ([place]) 12/26/2011   Diarrhea 10/18/2010    Orientation RESPIRATION BLADDER Height & Weight     Self, Time, Situation  Room air  Incontinent, External catheter Weight: (!) 302 lb 7.5 oz (137.2 kg) Height:  6' 1"  (185.4 cm)  BEHAVIORAL SYMPTOMS/MOOD NEUROLOGICAL BOWEL NUTRITION STATUS      Continent Diet (See dc summary)  AMBULATORY STATUS COMMUNICATION OF NEEDS Skin   Extensive Assist Verbally Normal                       Personal Care Assistance Level of Assistance  Bathing, Feeding, Dressing Bathing Assistance: Maximum assistance  Feeding assistance: Limited assistance Dressing Assistance: Maximum assistance     Functional Limitations Info  Sight, Hearing, Speech Sight Info: Adequate Hearing Info: Impaired Speech Info: Adequate    SPECIAL CARE FACTORS FREQUENCY  PT (By licensed PT), OT (By licensed OT)     PT Frequency:  5xweek OT Frequency: 5xweek            Contractures      Additional Factors Info  Code Status Code Status Info: DNR             Current Medications (01/22/2022):  This is the current hospital active medication list Current Facility-Administered Medications  Medication Dose Route Frequency Provider Last Rate Last Admin   acetaminophen (TYLENOL) tablet 650 mg  650 mg Oral Q6H PRN Candee Furbish, MD   650 mg at 01/21/22 2045   Or   acetaminophen (TYLENOL) suppository 650 mg  650 mg Rectal Q6H PRN Candee Furbish, MD       albuterol (PROVENTIL) (2.5 MG/3ML) 0.083% nebulizer solution 3 mL  3 mL Inhalation Q6H PRN Corey Harold, NP       amLODipine (NORVASC) tablet 10 mg  10 mg Oral Daily Corey Harold, NP   10 mg at 01/22/22 1000   aspirin chewable tablet 81 mg  81 mg Oral Daily Desai, Rahul P, PA-C   81 mg at 01/22/22 1000   budesonide (PULMICORT) nebulizer solution 0.25 mg  0.25 mg Nebulization BID Edwin Dada, MD   0.25 mg at 01/22/22 0843   carvedilol (COREG) tablet 12.5 mg  12.5 mg Oral BID WC Pemberton, Greer Ee, MD       cefTRIAXone (ROCEPHIN) 2 g in sodium chloride 0.9 % 100 mL IVPB  2 g Intravenous Q24H Edwin Dada, MD   Stopped at 01/21/22 1558   Chlorhexidine Gluconate Cloth 2 % PADS 6 each  6 each Topical Daily Jacky Kindle, MD   6 each at 01/21/22 1046   diclofenac Sodium (VOLTAREN) 1 % topical gel 2 g  2 g Topical QID Edwin Dada, MD   2 g at 01/22/22 1004   docusate sodium (COLACE) capsule 100 mg  100 mg Oral BID PRN Shearon Stalls, Rahul P, PA-C       DULoxetine (CYMBALTA) DR capsule 60 mg  60 mg Oral Daily Jacky Kindle, MD   60 mg at 01/22/22 1000   furosemide (LASIX) injection 40 mg  40 mg Intravenous BID Edwin Dada, MD   40 mg at 01/22/22 1000   heparin injection 5,000 Units  5,000 Units Subcutaneous Q8H Desai, Rahul P, PA-C   5,000 Units at 01/22/22 0513   hydrALAZINE (APRESOLINE) injection 10-40 mg  10-40 mg Intravenous  Q4H PRN Desai, Rahul P, PA-C       ipratropium-albuterol (DUONEB) 0.5-2.5 (3) MG/3ML nebulizer solution 3 mL  3 mL Nebulization BID Edwin Dada, MD   3 mL at 01/22/22 0842   isosorbide mononitrate (IMDUR) 24 hr tablet 60 mg  60 mg Oral Daily Jacky Kindle, MD   60 mg at 01/22/22 1000   polyethylene glycol (MIRALAX / GLYCOLAX) packet 17 g  17 g Oral Daily PRN Desai, Rahul P, PA-C       pravastatin (PRAVACHOL) tablet 40 mg  40 mg Oral Daily Corey Harold, NP   40 mg at 01/22/22 1000     Discharge Medications: Please see discharge summary for a list of discharge medications.  Relevant Imaging Results:  Relevant Lab Results:  Additional Information FEE:[phone]  Beckey Rutter, MSW, LCSWA, LCASA Transitions of Care  Clinical Social Worker I

## 2022-01-22 NOTE — Evaluation (Signed)
 Occupational Therapy Evaluation Patient Details Name: [name] MRN: [id number] DOB: 29-Apr-1940 Today's Date: 01/22/2022   History of Present Illness Pt is an 81 y/o male who presents 01/16/22 with SOB, cough, choking on food at home. He was found to have PNA and then respiratory failure. Pt underwent MBS and EGD during admission as well. PMH significant for Ankylosing spondylitis, CAD, CHF, HTN, COPD.   Clinical Impression   PTA, pt lives with family, typically Modified Independent with mobility using Rollator and has light assist for ADLs from an aide 6x/wk. Pt presents now with deficits in strength, standing balance, cognition and cardiopulmonary endurance. Pt more alert and eager for OOB attempts today. Overall, pt requires Min A x2  for pivots with RW, Mod A for UB ADL and up to Total A for LB ADLs. Pt's daughter present, pleased with his progress and hopeful for SNF rehab at [place].  SpO2 > 92% on 10 L HFNC     Recommendations for follow up therapy are one component of a multi-disciplinary discharge planning process, led by the attending physician.  Recommendations may be updated based on patient status, additional functional criteria and insurance authorization.   Follow Up Recommendations  Skilled nursing-short term rehab (<3 hours/day)     Assistance Recommended at Discharge Frequent or constant Supervision/Assistance  Patient can return home with the following A lot of help with walking and/or transfers;A lot of help with bathing/dressing/bathroom    Functional Status Assessment  Patient has had a recent decline in their functional status and demonstrates the ability to make significant improvements in function in a reasonable and predictable amount of time.  Equipment Recommendations  None recommended by OT    Recommendations for Other Services       Precautions / Restrictions Precautions Precautions: Fall;Other (comment) Precaution Comments: monitor  O2 Restrictions Weight Bearing Restrictions: No      Mobility Bed Mobility Overal bed mobility: Needs Assistance Bed Mobility: Supine to Sit     Supine to sit: Mod assist, +2 for physical assistance, +2 for safety/equipment, HOB elevated     General bed mobility comments: Assist to roll torso to reach bedrails, advance LLE to EOB and lift trunk. good effort on pt part    Transfers Overall transfer level: Needs assistance Equipment used: Rolling walker (2 wheels) Transfers: Sit to/from Stand, Bed to chair/wheelchair/BSC Sit to Stand: Min assist     Step pivot transfers: Min assist, +2 safety/equipment     General transfer comment: Able to easily stand from slightly higher bed height, +2 for safety/lines in stepping to the chair with noted progressive shakiness though no LOB or overt knee buckling noted      Balance Overall balance assessment: Needs assistance Sitting-balance support: Feet supported, No upper extremity supported Sitting balance-Leahy Scale: Fair     Standing balance support: During functional activity Standing balance-Leahy Scale: Poor                             ADL either performed or assessed with clinical judgement   ADL Overall ADL's : Needs assistance/impaired Eating/Feeding: Set up;Sitting Eating/Feeding Details (indicate cue type and reason): assist to open containers Grooming: Supervision/safety;Sitting   Upper Body Bathing: Moderate assistance;Sitting   Lower Body Bathing: Maximal assistance;Sit to/from stand   Upper Body Dressing : Moderate assistance;Sitting   Lower Body Dressing: Total assistance;Sit to/from stand       Toileting- Water quality scientist and Hygiene: Total assistance;Sit to/from  stand         General ADL Comments: Limited by weakness, unsteadiness on feet w/ increased assist needed for transfers and basic ADLs     Vision Ability to See in Adequate Light: 0 Adequate Patient Visual Report: No  change from baseline Vision Assessment?: No apparent visual deficits     Perception     Praxis      Pertinent Vitals/Pain Pain Assessment Pain Assessment: Faces Faces Pain Scale: Hurts a little bit Pain Location: L side of great toe Pain Descriptors / Indicators: Sore Pain Intervention(s): Monitored during session, Limited activity within patient's tolerance     Hand Dominance Right   Extremity/Trunk Assessment Upper Extremity Assessment Upper Extremity Assessment: Generalized weakness   Lower Extremity Assessment Lower Extremity Assessment: Defer to PT evaluation   Cervical / Trunk Assessment Cervical / Trunk Assessment: Other exceptions Cervical / Trunk Exceptions: prior cervical fusion, limited lateral rotation of neck   Communication Communication Communication: No difficulties   Cognition Arousal/Alertness: Awake/alert Behavior During Therapy: WFL for tasks assessed/performed Overall Cognitive Status: Impaired/Different from baseline Area of Impairment: Following commands, Safety/judgement, Memory                     Memory: Decreased short-term memory Following Commands: Follows one step commands with increased time, Follows one step commands consistently Safety/Judgement: Decreased awareness of safety, Decreased awareness of deficits     General Comments: pleasant, follows commands consistently, more alert than yesterday, does show insight into needs     General Comments  Daughter at bedside, VSS on 10 L O2    Exercises     Shoulder Instructions      Home Living Family/patient expects to be discharged to:: Private residence Living Arrangements: Children Available Help at Discharge: Family;Personal care attendant Type of Home: House Home Access: Stairs to enter Technical  of Steps: 1   Home Layout: One level     Bathroom Shower/Tub: Occupational psychologist: Standard     Home Equipment: Milo (2 wheels);Rollator (4 wheels) (adjustable bed, lift chair)   Additional Comments: PCA 4 hours a day 6 days a week      Prior Functioning/Environment Prior Level of Function : Needs assist             Mobility Comments: Uses the rollator in the house ADLs Comments: per daughter, aide assists with showers, dressing afterwards        OT Problem List: Decreased strength;Decreased activity tolerance;Impaired balance (sitting and/or standing);Decreased cognition;Decreased safety awareness;Decreased knowledge of use of DME or AE;Cardiopulmonary status limiting activity;Obesity      OT Treatment/Interventions: Self-care/ADL training;Therapeutic exercise;Energy conservation;DME and/or AE instruction;Therapeutic activities;Patient/family education    OT Goals(Current goals can be found in the care plan section) Acute Rehab OT Goals Patient Stated Goal: daughter still hopeful for rehab though pleased with pt progress; pt wanted to get up to chair today OT Goal Formulation: With patient/family Time For Goal Achievement: 02/05/22 Potential to Achieve Goals: Good  OT Frequency: Min 2X/week    Co-evaluation              AM-PAC OT "6 Clicks" Daily Activity     Outcome Measure Help from another person eating meals?: None Help from another person taking care of personal grooming?: A Little Help from another person toileting, which includes using toliet, bedpan, or urinal?: Total Help from another person bathing (including washing, rinsing, drying)?: A Lot Help from another person  to put on and taking off regular upper body clothing?: A Lot Help from another person to put on and taking off regular lower body clothing?: Total 6 Click Score: 13   End of Session Equipment Utilized During Treatment: Gait belt;Rolling walker (2 wheels);Oxygen Nurse Communication: Mobility status (RN present)  Activity Tolerance: Patient tolerated treatment well Patient left: in  chair;with call bell/phone within reach;with chair alarm set;with family/visitor present;with nursing/sitter in room  OT Visit Diagnosis: Unsteadiness on feet (R26.81);Other abnormalities of gait and mobility (R26.89);Muscle weakness (generalized) (M62.81)                Time: 1484-0397 OT Time Calculation (min): 23 min Charges:  OT General Charges $OT Visit: 1 Visit OT Evaluation $OT Eval Moderate Complexity: 1 Mod  Malachy Chamber, OTR/L Acute Rehab Services Office: [phone]   Layla Maw 01/22/2022, 10:24 AM

## 2022-01-22 NOTE — Progress Notes (Addendum)
Modified Barium Swallow Progress Note  Patient Details  Name: [name] MRN: [id number] Date of Birth: Nov 12, 1940  Today's Date: 01/22/2022  Modified Barium Swallow completed.  Full report located under Chart Review in the Imaging Section.  Brief recommendations include the following:  Clinical Impression  Pt has a mild oropharyngeal dysphagia that is multifactorial in nature given chronic structural issues in the setting of more acute decline in respiratory status and overall conditioning. Note that images were somewhat limited by shadows from pt shoulder, and he said he could not move his neck or shoulders throughout the study to either help with visibility or attempt postural strategies. Pt has mildly reduced bolus cohesion with thin liquids, with thin liquids spilling into the pyriform sinuses prior to the swallow. He has reduced anterior hyoid excursion and base of tongue retraction, contributing to intermittently incomplete epiglottic inversion. Pt also has prior cervical hardware that appears to impact full epiglottic deflection. With smaller cup sips of thin liquids and with solid boluses he can still adequately protect his airway (PAS 2-3 with thin liquids). When he drinks via straw he has an episode of silent aspiration (PAS 8) as well as additional deeper penetration that does not clear (still PAS 3). Note that coughing was noted intermittently throughout testing and is not necessarily correlated with aspiration. This happens as a result of aforementioned deficits in the setting of larger boluses that make it more challenging for pt to compensate and coordinate for consistent airway protection. Pt has improved airway protection (PAS 2) with nectar thick liquids regardless of delivery method and rate. Discussed options to try to reduce aspiration risk with pt, which include continuing on thin liquids via small cup sips with careful use of precautions, or alternatively trying nectar  thick liquids at least temporarily in the setting of acute decompensation. Pt had a hard time answering the question, as he kept asking for buttermilk. Given some of the challenges he had during this study wtih adhering to recommended strategies, would favor trialing nectar thick liquids with his Dys 3 diet, with ongoing SLP f/u to try to facilitate return to thin liquids.   Swallow Evaluation Recommendations       SLP Diet Recommendations: Dysphagia 3 (Mech soft) solids;Nectar thick liquid   Liquid Administration via: Cup;Straw   Medication Administration: Whole meds with puree   Supervision: Staff to assist with self feeding   Compensations: Slow rate;Small sips/bites   Postural Changes: Seated upright at 90 degrees;Remain semi-upright after after feeds/meals (Comment)   Oral Care Recommendations: Oral care BID        Osie Bond., M.A. [place] Office [phone]  Secure chat preferred  01/22/2022,3:24 PM

## 2022-01-22 NOTE — Progress Notes (Signed)
Rounding Note    Patient Name: [name] Date of Encounter: 01/22/2022  [place] Cardiologist: Quay Burow, MD   Subjective   Placed back on BiPAP this morning. He is frustrated with being in the hospital and would like to leave. No chest pain.   Cr improved 1.33>1.21 2100 of UOP; no ins recorded Wt 305>302lbs  Inpatient Medications    Scheduled Meds:  amLODipine  10 mg Oral Daily   aspirin  81 mg Oral Daily   budesonide (PULMICORT) nebulizer solution  0.25 mg Nebulization BID   carvedilol  12.5 mg Oral BID WC   Chlorhexidine Gluconate Cloth  6 each Topical Daily   diclofenac Sodium  2 g Topical QID   DULoxetine  60 mg Oral Daily   furosemide  40 mg Intravenous BID   heparin  5,000 Units Subcutaneous Q8H   ipratropium-albuterol  3 mL Nebulization BID   isosorbide mononitrate  60 mg Oral Daily   pravastatin  40 mg Oral Daily   Continuous Infusions:  [START ON 01/23/2022] cefTRIAXone (ROCEPHIN)  IV     PRN Meds: acetaminophen **OR** acetaminophen, albuterol, docusate sodium, hydrALAZINE, polyethylene glycol   Vital Signs    Vitals:   01/22/22 0500 01/22/22 0831 01/22/22 0842 01/22/22 1429  BP:  (!) 151/63  (!) 146/71  Pulse:  87  85  Resp:  18  18  Temp:  98.3 F (36.8 C)  100.2 F (37.9 C)  TempSrc:  Oral  Oral  SpO2:  (!) 88% 93% 93%  Weight: (!) 137.2 kg     Height:        Intake/Output Summary (Last 24 hours) at 01/22/2022 1800 Last data filed at 01/22/2022 1036 Gross per 24 hour  Intake 100.06 ml  Output 2400 ml  Net -2299.94 ml       01/22/2022    5:00 AM 01/21/2022    5:50 AM 01/20/2022   10:24 AM  Last 3 Weights  Weight (lbs) 302 lb 7.5 oz 305 lb 12.5 oz 306 lb 14.1 oz  Weight (kg) 137.2 kg 138.7 kg 139.2 kg      Telemetry    NSR, occasional PVCs - Personally Reviewed  ECG    NSR, first degree AVB, LVH with strain - Personally Reviewed  Physical Exam   GEN: Laying down, BiPAP in place Neck: No  JVD Cardiac: RRR, no murmurs, rubs, or gallops.  Respiratory: Scattered expiratory wheezing GI: Obese, soft MS: Trace edema, warm Neuro:  Nonfocal  Psych: Normal affect   Labs    High Sensitivity Troponin:   Recent Labs  Lab 01/16/22 1831 01/16/22 2015 01/17/22 0231  TROPONINIHS 402* 540* 451*      Chemistry Recent Labs  Lab 01/16/22 1831 01/16/22 1858 01/18/22 0426 01/19/22 0419 01/20/22 0259 01/21/22 0243 01/22/22 0429  NA 140   < > 140   < > 140 137 136  K 3.7   < > 3.4*   < > 3.6 3.6 3.4*  CL 105   < > 103   < > 101 100 98  CO2 23   < > 27   < > 28 28 30   GLUCOSE 126*   < > 118*   < > 127* 139* 138*  BUN 20   < > 22   < > 22 30* 30*  CREATININE 1.40*   < > 1.50*   < > 1.39* 1.42* 1.33*  CALCIUM 10.0   < > 9.7   < >  10.0 9.8 10.3  MG  --    < > 2.2  --   --  2.2 2.3  PROT 7.3  --   --   --   --   --  6.6  ALBUMIN 3.1*  --   --   --   --   --  2.4*  AST 43*  --   --   --   --   --  32  ALT 38  --   --   --   --   --  27  ALKPHOS 88  --   --   --   --   --  90  BILITOT 0.9  --   --   --   --   --  0.6  GFRNONAA 50*   < > 46*   < > 50* 49* 53*  ANIONGAP 12   < > 10   < > 11 9 8    < > = values in this interval not displayed.     Lipids No results for input(s): "CHOL", "TRIG", "HDL", "LABVLDL", "LDLCALC", "CHOLHDL" in the last 168 hours.  Hematology Recent Labs  Lab 01/20/22 0259 01/21/22 0243 01/22/22 0429  WBC 12.2* 14.8* 12.0*  RBC 4.61 4.34 4.52  HGB 13.9 13.0 13.8  HCT 42.0 40.0 41.9  MCV 91.1 92.2 92.7  MCH 30.2 30.0 30.5  MCHC 33.1 32.5 32.9  RDW 13.8 13.7 13.4  PLT 270 265 243    Thyroid No results for input(s): "TSH", "FREET4" in the last 168 hours.  BNP Recent Labs  Lab 01/16/22 1831 01/17/22 0231  BNP 192.4* 159.1*     DDimer No results for input(s): "DDIMER" in the last 168 hours.   Radiology    DG Swallowing Func-Speech Pathology  Result Date: 01/22/2022 Table formatting from the original result was not included. Objective  Swallowing Evaluation: Type of Study: MBS-Modified Barium Swallow Study  Patient Details Name: SOCRATES CAHOON MRN: [id number] Date of Birth: 07-Jun-1940 Today's Date: 01/22/2022 Time: SLP Start Time (ACUTE ONLY): 32 -SLP Stop Time (ACUTE ONLY): 2952 SLP Time Calculation (min) (ACUTE ONLY): 23 min Past Medical History: Past Medical History: Diagnosis Date  Ankylosing spondylitis ([place]) 11/07/2018  Asthma   BPH (benign prostatic hyperplasia)   CAD (coronary artery disease) 01/1995  CHF (congestive heart failure) ([place])   Clostridium difficile colitis 04/2005  Colitis 2011  COPD (chronic obstructive pulmonary disease) ([name])   Depression   Diverticulosis   DJD (degenerative joint disease)   Gastric ulcer 04/17/10  Three 68m gastric ulcers, H.pylori serologies were negative  GERD (gastroesophageal reflux disease)   History of kidney stones   Hyperlipidemia   Hypertension   Idiopathic chronic inflammatory bowel disease 05/18/2010  left-sided UC  Kidney stone   Morbid obesity ([facility] 03/12/2018  Obstructive sleep apnea   on Cpap  Reflux 02/1995  S/P endoscopy 07/24/10  retained gastric contents, benign bx Past Surgical History: Past Surgical History: Procedure Laterality Date  ANORECTAL MANOMETRY  2016  [facility]: concern for possible fissure. Noted pelvic floor dyssnyergy  BIOPSY  07/29/2017  Procedure: BIOPSY;  Surgeon: RDaneil Dolin MD;  [facility];  Service: Endoscopy;;  ascending and sigmoid colon  CARDIAC CATHETERIZATION    with stent  CARDIOVASCULAR STRESS TEST  07/21/2009  No scintigraphic evidence of inducible myocardial ischemia  CARPAL TUNNEL RELEASE Left 01/17/2017  Procedure: LEFT CARPAL TUNNEL RELEASE;  Surgeon: HCarole Civil MD;  Location: [facility];  Service: Orthopedics;  Laterality: Left;  CATARACT EXTRACTION, BILATERAL Bilateral   CERVICAL SPINE SURGERY    C4-5  COLONOSCOPY  04/2005  granularity and friability erosions from rectum to 40cm. Bx infection vs IBD. C. Diff positive at the time.    COLONOSCOPY  05/2010  [facility]: left-sided UC, bx with no dysplasia, shallow diverticula  COLONOSCOPY [name] 11/07/2012  [facility]:ERDE-YCXKG proctocolitis status post segmental biopsy/Sigmoid colon polyps removed as described above. Procedure compromisd by technical difficulties. bx: Inflammation limited to sigmoid and rectum on pathology.  COLONOSCOPY  04/2014  [facility] Cow[facility] at [facility]: well localized proctocolitis limited to sigmoid  COLONOSCOPY WITH PROPOFOL [name] 07/29/2017  diverticulosis in colon, three 4-6 mm polyps at splenic flexure and in cecum, one 10 mm polyp in rectum, abnormal rectum and sigmoid consistent with active UC  CORONARY STENT PLACEMENT  01/1995  ESOPHAGEAL DILATION  01/20/2022  Procedure: ESOPHAGEAL DILATION;  Surgeon: Otis Brace, MD;  Location: [facility];  Service: Gastroenterology;;  ESOPHAGOGASTRODUODENOSCOPY  07/24/2010  YJE:HUDJSH esophagus  ESOPHAGOGASTRODUODENOSCOPY  04/2014  Dr. Nyoka Cowden at [facility]: negative small bowel biopsies  ESOPHAGOGASTRODUODENOSCOPY (EGD) WITH PROPOFOL [name] 01/20/2022  Procedure: ESOPHAGOGASTRODUODENOSCOPY (EGD) WITH PROPOFOL;  Surgeon: Otis Brace, MD;  Location: [facility];  Service: Gastroenterology;  Laterality: [name];  FOOT SURGERY Bilateral two  KNEE SURGERY  two  POLYPECTOMY  07/29/2017  Procedure: POLYPECTOMY;  Surgeon: Daneil Dolin, MD;  [facility];  Service: Endoscopy;;  splenic flexure, ascending colon polyp;rectal  SHOULDER SURGERY  two  TONSILLECTOMY    TRANSTHORACIC ECHOCARDIOGRAM  03/23/2009  EF 60-65%, normal LV systolic function  ULNAR HEAD EXCISION Left 01/17/2017  Procedure: LEFT ULNAR HEAD RESECTION;  Surgeon: Carole Civil, MD;  Location: [facility];  Service: Orthopedics;  Laterality: Left; HPI: REYMOND MAYNEZ is a 81 y.o. male who presented to [facility] ED 12/12 with dyspnea. He had experienced intermittent non bilious emesis earlier in the week and on 12/11, he developed fever with Tmax of 101.8.  He apparently has had a steady  increase in his weight over the past few weeks, unsure exactly how much.  CXR 12/14: "Bibasilar airspace disease, not significantly changed from prior,  compatible with pneumonia."  Pt seen by outpatient SLP 12/4 with recommendations for MBSS (OP MBS scheduled for 12/20).  Esophagram 12/14 with no passage in distal esophagus.  Pt now s/p EGD with dilation of Schatzki Ring on 12/16.  Pt has a PMH including but not limited to COPD on 4L O2, OSA on CPAP, CHF.  Subjective: pt alert and cooperative, says he usually lies completely flat while drinking  Recommendations for follow up therapy are one component of a multi-disciplinary discharge planning process, led by the attending physician.  Recommendations may be updated based on patient status, additional functional criteria and insurance authorization. Assessment / Plan / Recommendation   01/22/2022   2:00 PM Clinical Impressions Clinical Impression Pt has a mild oropharyngeal dysphagia that is multifactorial in nature given chronic structural issues in the setting of more acute decline in respiratory status and overall conditioning. Note that images were somewhat limited by shadows from pt shoulder, and he said he could not move his neck or shoulders throughout the study to either help with visibility or attempt postural strategies. Pt has mildly reduced bolus cohesion with thin liquids, with thin liquids spilling into the pyriform sinuses prior to the swallow. He has reduced anterior hyoid excursion and base of tongue retraction, contributing to intermittently incomplete epiglottic inversion. Pt also has prior cervical hardware that appears to impact  full epiglottic deflection. With smaller cup sips of thin liquids and with solid boluses he can still adequately protect his airway (PAS 2-3 with thin liquids). When he drinks via straw he has an episode of silent aspiration (PAS 8) as well as additional deeper penetration that does not clear (still PAS 3). Note that  coughing was noted intermittently throughout testing and is not necessarily correlated with aspiration. This happens as a result of aforementioned deficits in the setting of larger boluses that make it more challenging for pt to compensate and coordinate for consistent airway protection. Pt has improved airway protection (PAS 2) with nectar thick liquids regardless of delivery method and rate. Discussed options to try to reduce aspiration risk with pt, which include continuing on thin liquids via small cup sips with careful use of precautions, or alternatively trying nectar thick liquids at least temporarily in the setting of acute decompensation. Pt had a hard time answering the question, as he kept asking for buttermilk. Given some of the challenges he had during this study wtih adhering to recommended strategies, would favor trialing nectar thick liquids with his Dys 3 diet, with ongoing SLP f/u to try to facilitate return to thin liquids. SLP Visit Diagnosis Dysphagia, oropharyngeal phase (R13.12) Impact on safety and function Mild aspiration risk;Moderate aspiration risk     01/22/2022   2:00 PM Treatment Recommendations Treatment Recommendations Therapy as outlined in treatment plan below     01/22/2022   2:00 PM Prognosis Prognosis for Safe Diet Advancement Good   01/22/2022   2:00 PM Diet Recommendations SLP Diet Recommendations Dysphagia 3 (Mech soft) solids;Nectar thick liquid Liquid Administration via Cup;Straw Medication Administration Whole meds with puree Compensations Slow rate;Small sips/bites Postural Changes Seated upright at 90 degrees;Remain semi-upright after after feeds/meals (Comment)     01/22/2022   2:00 PM Other Recommendations Oral Care Recommendations Oral care BID Follow Up Recommendations Skilled nursing-short term rehab (<3 hours/day) Functional Status Assessment Patient has had a recent decline in their functional status and demonstrates the ability to make significant improvements in  function in a reasonable and predictable amount of time.   01/22/2022   2:00 PM Frequency and Duration  Speech Therapy Frequency (ACUTE ONLY) min 2x/week Treatment Duration 2 weeks     01/22/2022   2:00 PM Oral Phase Oral Phase Impaired Oral - Thin Teaspoon WFL Oral - Thin Cup Decreased bolus cohesion;Lingual/palatal residue Oral - Thin Straw Decreased bolus cohesion;Lingual/palatal residue;Premature spillage Oral - Puree Reduced posterior propulsion Oral - Regular Reduced posterior propulsion    01/22/2022   2:00 PM Pharyngeal Phase Pharyngeal Phase Impaired Pharyngeal- Nectar Cup Reduced anterior laryngeal mobility;Penetration/Aspiration before swallow Pharyngeal Material enters airway, remains ABOVE vocal cords then ejected out Pharyngeal- Nectar Straw Reduced anterior laryngeal mobility;Penetration/Aspiration before swallow Pharyngeal Material enters airway, remains ABOVE vocal cords then ejected out Pharyngeal- Thin Teaspoon Reduced tongue base retraction;Reduced anterior laryngeal mobility Pharyngeal- Thin Cup Reduced anterior laryngeal mobility;Reduced tongue base retraction;Pharyngeal residue - valleculae;Penetration/Aspiration before swallow;Reduced epiglottic inversion Pharyngeal Material enters airway, remains ABOVE vocal cords and not ejected out Pharyngeal- Thin Straw Reduced anterior laryngeal mobility;Reduced tongue base retraction;Pharyngeal residue - valleculae;Penetration/Aspiration before swallow;Reduced epiglottic inversion Pharyngeal Material enters airway, passes BELOW cords without attempt by patient to eject out (silent aspiration) Pharyngeal- Puree Reduced anterior laryngeal mobility Pharyngeal- Regular Reduced epiglottic inversion;Reduced anterior laryngeal mobility    01/22/2022   2:00 PM Cervical Esophageal Phase  Cervical Esophageal Phase -- Osie Bond., M.A. [place] Office [phone] Secure chat preferred  01/22/2022, 3:25 PM                     DG Chest 2  View  Result Date: 01/22/2022 CLINICAL DATA:  Shortness of breath.  Hypoxia. EXAM: CHEST - 2 VIEW COMPARISON:  01/20/2022 FINDINGS: Cardiomegaly. Aortic atherosclerosis. Patchy density at the lung bases consistent with atelectasis/pneumonia. No dense consolidation, collapse or effusion. No acute bone finding. IMPRESSION: Patchy density at the lung bases consistent with atelectasis/pneumonia. Electronically Signed   By: Nelson Chimes M.D.   On: 01/22/2022 14:27    Cardiac Studies   TTE 01/17/22: IMPRESSIONS     1. Left ventricular ejection fraction, by estimation, is 55 to 60%. The  left ventricle has normal function. The left ventricle has no regional  wall motion abnormalities. There is moderate concentric left ventricular  hypertrophy. Indeterminate diastolic  filling due to E-A fusion.   2. Right ventricular systolic function was not well visualized. The right  ventricular size is not well visualized.   3. The mitral valve was not well visualized. No evidence of mitral valve  regurgitation.   4. The aortic valve was not well visualized. Aortic valve regurgitation  is not visualized. No aortic stenosis is present.   5. Aortic dilatation noted. There is mild dilatation of the aortic root,  measuring 41 mm.   6. The inferior vena cava is normal in size with greater than 50%  respiratory variability, suggesting right atrial pressure of 3 mmHg.   Comparison(s): No significant change from prior study.   Patient Profile     81 y.o. male hx of CAD, COPD on 4LNC, OSA, Dementia and HFpEF who presented with worsening SOB, fever and LE edema found to have multifocal pneumonia and acute diastolic HF. Cardiology is consulted regarding management of acute diastolic HF.  Assessment & Plan    #Acute on chronic diastolic heart failure:  Patient presented with respiratory distress found to have multifocal pneumonia and acute on chronic diastolic HF exacerbation. TTE with EF 55-60%, moderate LVH,  indeterminate diastolic function. Has been responding well to lasix IV BID. Cr improved today. Continues to require significant O2; will continue with IV diuresis today and possibly transition to PO tomorrow. -Continue lasix 52m IV BID; possibly plan to transition to PO tomorrow -Start spironolactone 12.539mdaily -Trend I/Os and daily weights -BP control as below  #Acute on chronic respiratory failure with hypoxia:  Likely multifactorial due to CAP, acute on chronic diastolic heart failure, COPD. -Diuresis as above -Abx per primary team -On BiPAP currently  #Hypertensive urgency:  Initially required Nitro gtt, now with improvement in pressures and transitioned to PO. -Continue amlodipine 1081maily -Continue coreg 12.5mg31mD -Increase imdur to 90mg17mly -Start spironolactone 12.5mg d21my  #Elevated troponin:  Likely demand ischemia in setting of acute illness.  Peaked 540. Will continue with medical management as above. -Continue ASA 81mg d65m -Continue pravastatin 40mg da75m #Esophageal stricture:  Noted on esophagram, underwent an EGD 12/17 which showed lower esophageal ring which was dilated  #AKI:  -Monitor with diuresis  #HLD:  -On pravastatin 40mg dai32mFor questions or updates, please contact [facility] [email or web address] for contact info under        Signed, Atha Mcbain EFreada Bergeron18/2023, 6:00 PM

## 2022-01-22 NOTE — Progress Notes (Addendum)
Mobility Specialist Progress Note   01/22/22 1250  Mobility  Activity Transferred from chair to bed  Level of Assistance +2 (takes two people)  Games developer wheel walker  Distance Ambulated (ft) 4 ft  Activity Response Tolerated fair  $Mobility charge 1 Mobility   Received in chair requesting to get back to bed d/t fatigue. Upon standing w/ modA, pt soiled self and required +2A for line management/safety and pericare. Left supine in bed dyspneic but SpO2 reading 90% on 10LO2. Encouraged PLB, placed call bell in reach and notified NT. Daughter present in room.    Holland Falling Mobility Specialist Please contact via SecureChat or  Rehab office at [phone]

## 2022-01-23 ENCOUNTER — Inpatient Hospital Stay (HOSPITAL_COMMUNITY): Payer: Medicare Other

## 2022-01-23 DIAGNOSIS — J9621 Acute and chronic respiratory failure with hypoxia: Secondary | ICD-10-CM | POA: Diagnosis not present

## 2022-01-23 DIAGNOSIS — J189 Pneumonia, unspecified organism: Secondary | ICD-10-CM | POA: Diagnosis not present

## 2022-01-23 DIAGNOSIS — J9611 Chronic respiratory failure with hypoxia: Secondary | ICD-10-CM | POA: Diagnosis not present

## 2022-01-23 DIAGNOSIS — I169 Hypertensive crisis, unspecified: Secondary | ICD-10-CM | POA: Diagnosis not present

## 2022-01-23 DIAGNOSIS — I2510 Atherosclerotic heart disease of native coronary artery without angina pectoris: Secondary | ICD-10-CM | POA: Diagnosis not present

## 2022-01-23 DIAGNOSIS — I5033 Acute on chronic diastolic (congestive) heart failure: Secondary | ICD-10-CM | POA: Diagnosis not present

## 2022-01-23 LAB — COMPREHENSIVE METABOLIC PANEL
ALT: 28 U/L (ref 0–44)
AST: 33 U/L (ref 15–41)
Albumin: 2.2 g/dL — ABNORMAL LOW (ref 3.5–5.0)
Alkaline Phosphatase: 82 U/L (ref 38–126)
Anion gap: 8 (ref 5–15)
BUN: 25 mg/dL — ABNORMAL HIGH (ref 8–23)
CO2: 26 mmol/L (ref 22–32)
Calcium: 9.9 mg/dL (ref 8.9–10.3)
Chloride: 101 mmol/L (ref 98–111)
Creatinine, Ser: 1.21 mg/dL (ref 0.61–1.24)
GFR, Estimated: 59 mL/min — ABNORMAL LOW (ref >60)
Glucose, Bld: 136 mg/dL — ABNORMAL HIGH (ref 70–99)
Potassium: 3.8 mmol/L (ref 3.5–5.1)
Sodium: 135 mmol/L (ref 135–145)
Total Bilirubin: 0.4 mg/dL (ref 0.3–1.2)
Total Protein: 6.3 g/dL — ABNORMAL LOW (ref 6.5–8.1)

## 2022-01-23 LAB — BLOOD GAS, ARTERIAL
Acid-Base Excess: 7.5 mmol/L — ABNORMAL HIGH (ref 0.0–2.0)
Bicarbonate: 32.7 mmol/L — ABNORMAL HIGH (ref 20.0–28.0)
O2 Saturation: 91.5 %
Patient temperature: 37
pCO2 arterial: 47 mmHg (ref 32–48)
pH, Arterial: 7.45 (ref 7.35–7.45)
pO2, Arterial: 58 mmHg — ABNORMAL LOW (ref 83–108)

## 2022-01-23 LAB — CBC
HCT: 38.7 % — ABNORMAL LOW (ref 39.0–52.0)
Hemoglobin: 12.4 g/dL — ABNORMAL LOW (ref 13.0–17.0)
MCH: 30.1 pg (ref 26.0–34.0)
MCHC: 32 g/dL (ref 30.0–36.0)
MCV: 93.9 fL (ref 80.0–100.0)
Platelets: 275 10*3/uL (ref 150–400)
RBC: 4.12 MIL/uL — ABNORMAL LOW (ref 4.22–5.81)
RDW: 13.4 % (ref 11.5–15.5)
WBC: 12.7 10*3/uL — ABNORMAL HIGH (ref 4.0–10.5)
nRBC: 0 % (ref 0.0–0.2)

## 2022-01-23 MED ORDER — POTASSIUM CHLORIDE 10 MEQ/100ML IV SOLN
10.0000 meq | INTRAVENOUS | Status: AC
  Administered 2022-01-23 (×2): 10 meq via INTRAVENOUS
  Filled 2022-01-23 (×2): qty 100

## 2022-01-23 MED ORDER — SPIRONOLACTONE 12.5 MG HALF TABLET
12.5000 mg | ORAL_TABLET | Freq: Every day | ORAL | Status: DC
  Administered 2022-01-23 – 2022-01-26 (×4): 12.5 mg via ORAL
  Filled 2022-01-23 (×4): qty 1

## 2022-01-23 MED ORDER — FUROSEMIDE 10 MG/ML IJ SOLN
80.0000 mg | Freq: Once | INTRAMUSCULAR | Status: AC
  Administered 2022-01-23: 80 mg via INTRAVENOUS
  Filled 2022-01-23: qty 8

## 2022-01-23 MED ORDER — FUROSEMIDE 10 MG/ML IJ SOLN: 40.0000 mg | Freq: Two times a day (BID) | INTRAMUSCULAR | Status: DC

## 2022-01-23 MED ORDER — ISOSORBIDE MONONITRATE ER 60 MG PO TB24
90.0000 mg | ORAL_TABLET | Freq: Every day | ORAL | Status: DC
  Administered 2022-01-23 – 2022-01-26 (×4): 90 mg via ORAL
  Filled 2022-01-23 (×4): qty 1

## 2022-01-23 NOTE — Plan of Care (Signed)

## 2022-01-23 NOTE — Progress Notes (Signed)
  Progress Note   Patient: [name] IRJ:[id number] DOB: 1940-03-18 DOA: 01/16/2022     6 DOS: the patient was seen and examined on 01/23/2022        Brief hospital course: [name] is an 81 y.o. M with COPD and dCHF and morbid obesity BMI 40.4 with chronic respiratory failure on 4L home O2, HTN, OSA on BiPAP, HLD and CAD s/p PCI >69yr ulcerative colitis not on therapy who presented with CHF and pneumonia.  See prior summary     Assessment and Plan: * Acute on chronic hypoxic respiratory failure ([name]) Multifactorial from CHF, pneumonia, atelectasis given obesity.  CXR yesterday no change.  No significant effusions. SpO2 remains poor. ABG today without hypercarbia.  Acute on chronic diastolic heart failure ([name]) Net negative 2.1L, Cr stable - Continue IV Lasix - Continue Potassium - Strict I/Os, daily weights, daily BMP     Community acquired pneumonia of bilateral lower lobes and right middle lobe due to Streptoccocus pneumoniae Still requiring >home O2 due to pneumonia, will need to continue antibiotics until more clinical improvement. Low grade fever last night - Continue Rocephin day 8 - Flutter and IS, mobilization  Hypertensive crisis BP normal - Continue amlodipine, furosemide, Imdur, Coreg, PRN hydralazine - Start spironolactone  Esophageal dysphagia Barium esophagram showed: Persistent narrowing / stenosis of the distal esophagus with failure of the barium tab to pass   EGD 12/16 showed schatzki ring, dilated successfully, no malignant appearing lesions. GI suspect some esophageal dysmotility at well.  Doing well on soft diet. - Consult SLP, appreciate expertise  Hypokalemia - Supplement K  Myocardial injury Due to CHF, pneumonia.  No ischemia.  AKI (acute kidney injury) ([place] Baseline 1.1.  Cr here up to 1.5, improving steadily with diruesis  Depression, major, single episode, moderate ([name]) - Continue Cymbalta  Chronic respiratory  failure with hypoxia ([name]) - Continue supplemental O2  COPD (chronic obstructive pulmonary disease) ([place] I suspect some of his hypoxia is bronchospastic/inflammatory - Continue new Pulmicort - Continue scheduled and PRN albuterol  - Aggressive pulmonary toilet  Morbid obesity ([name]) BMI 40.4  Dyslipidemia, goal LDL below 70 - Continue pravastatin  Essential hypertension See above  CAD S/P percutaneous coronary angioplasty - Continue aspirin, pravastatin, Coreg  OSA treated with BiPAP - Continue BiPAP at night          Subjective: More sleepy this mroning because he didn't use his Cpap and didn't sleep last night.  Still heavy cough, still productive cough.  Asking to go home, asking for more food.     Physical Exam: BP (!) 109/41 (BP Location: Right Wrist)   Pulse 66   Temp 98.2 F (36.8 C) (Axillary)   Resp (!) 24   Ht 6' 1"  (1.854 m)   Wt (!) 137.2 kg   SpO2 93%   BMI 39.91 kg/m   Adult male, lying in bed, appears severely debilitated RRR, no murmurs, no pitting peripheral edema, but still appears grossly overloaded Respiratory diminished, lungs excursion severely limited Abdomen rotund, no grimace to palpation Attention normal, affect blunted, judgment and insight appear at baseline, severe generalized weakness    Data Reviewed: Complete blood count shows white blood cell count of 12 Metabolic panel shows creatinine down to 1.2  Family Communication: Daughter at the bedside    Disposition: Status is: Inpatient         Author: CEdwin Dada MD 01/23/2022 4:19 PM  For on call review [email or web address]

## 2022-01-23 NOTE — Progress Notes (Signed)
Rounding Note    Patient Name: [name] Date of Encounter: 01/24/2022  [place] Cardiologist: Chris Burow, MD   Subjective   Feeling much better today. Breathing improved.   Cr bumped 1.2>1.4; received additional dose of lasix 21m IV yesterday   Inpatient Medications    Scheduled Meds:  amLODipine  10 mg Oral Daily   aspirin  81 mg Oral Daily   budesonide (PULMICORT) nebulizer solution  0.25 mg Nebulization BID   carvedilol  12.5 mg Oral BID WC   Chlorhexidine Gluconate Cloth  6 each Topical Daily   diclofenac Sodium  2 g Topical QID   DULoxetine  60 mg Oral Daily   furosemide  40 mg Intravenous BID   heparin  5,000 Units Subcutaneous Q8H   ipratropium-albuterol  3 mL Nebulization BID   isosorbide mononitrate  90 mg Oral Daily   pravastatin  40 mg Oral Daily   spironolactone  12.5 mg Oral Daily   Continuous Infusions:  cefTRIAXone (ROCEPHIN)  IV     PRN Meds: acetaminophen **OR** acetaminophen, albuterol, docusate sodium, hydrALAZINE, polyethylene glycol   Vital Signs    Vitals:   01/23/22 2000 01/23/22 2150 01/23/22 2332 01/24/22 0633  BP: (!) 137/59   (!) 145/53  Pulse: 69  60 (!) 58  Resp: 19  16 18   Temp: 98.3 F (36.8 C)   98.3 F (36.8 C)  TempSrc: Oral   Oral  SpO2: 94% 92% 92% 96%  Weight:    (!) 137.4 kg  Height:        Intake/Output Summary (Last 24 hours) at 01/24/2022 0837 Last data filed at 01/24/2022 [id number] per 24 hour  Intake --  Output 3350 ml  Net -3350 ml      01/24/2022    6:33 AM 01/22/2022    5:00 AM 01/21/2022    5:50 AM  Last 3 Weights  Weight (lbs) 303 lb 302 lb 7.5 oz 305 lb 12.5 oz  Weight (kg) 137.44 kg 137.2 kg 138.7 kg      Telemetry    NSR, occasional PVC - Personally Reviewed  ECG    No new tracing - Personally Reviewed  Physical Exam   GEN: Sitting in bed, comfortable Neck: No JVD Cardiac: RRR, no murmurs, rubs, or gallops.  Respiratory: Diminished but clear GI:  Obese, soft MS: No edema, warm Neuro:  Nonfocal  Psych: Normal affect   Labs    High Sensitivity Troponin:   Recent Labs  Lab 01/16/22 1831 01/16/22 2015 01/17/22 0231  TROPONINIHS 402* 540* 451*     Chemistry Recent Labs  Lab 01/18/22 0426 01/19/22 0419 01/21/22 0243 01/22/22 0429 01/23/22 0400 01/24/22 0151  NA 140   < > 137 136 135 137  K 3.4*   < > 3.6 3.4* 3.8 4.2  CL 103   < > 100 98 101 101  CO2 27   < > 28 30 26 28   GLUCOSE 118*   < > 139* 138* 136* 142*  BUN 22   < > 30* 30* 25* 34*  CREATININE 1.50*   < > 1.42* 1.33* 1.21 1.40*  CALCIUM 9.7   < > 9.8 10.3 9.9 10.4*  MG 2.2  --  2.2 2.3  --   --   PROT  --   --   --  6.6 6.3* 6.6  ALBUMIN  --   --   --  2.4* 2.2* 2.3*  AST  --   --   --  32 33 40  ALT  --   --   --  27 28 31   ALKPHOS  --   --   --  90 82 84  BILITOT  --   --   --  0.6 0.4 0.7  GFRNONAA 46*   < > 49* 53* 59* 50*  ANIONGAP 10   < > 9 8 8 8    < > = values in this interval not displayed.    Lipids No results for input(s): "CHOL", "TRIG", "HDL", "LABVLDL", "LDLCALC", "CHOLHDL" in the last 168 hours.  Hematology Recent Labs  Lab 01/22/22 0429 01/23/22 0400 01/24/22 0151  WBC 12.0* 12.7* 12.2*  RBC 4.52 4.12* 3.99*  HGB 13.8 12.4* 12.2*  HCT 41.9 38.7* 37.5*  MCV 92.7 93.9 94.0  MCH 30.5 30.1 30.6  MCHC 32.9 32.0 32.5  RDW 13.4 13.4 13.4  PLT 243 275 260   Thyroid No results for input(s): "TSH", "FREET4" in the last 168 hours.  BNP No results for input(s): "BNP", "PROBNP" in the last 168 hours.  DDimer No results for input(s): "DDIMER" in the last 168 hours.   Radiology    DG CHEST PORT 1 VIEW  Result Date: 01/23/2022 CLINICAL DATA:  Somnolence, short of breath, confusion EXAM: PORTABLE CHEST 1 VIEW COMPARISON:  01/22/2022 FINDINGS: Single frontal view of the chest demonstrates stable enlargement of the cardiac silhouette. Central vascular congestion again noted, with patchy areas of consolidation at the lung bases unchanged.  Stable emphysema. No effusion or pneumothorax. No acute bony abnormalities. IMPRESSION: 1. Stable bibasilar consolidation which may reflect atelectasis or pneumonia. 2. Stable emphysema. Electronically Signed   By: [name] M.D.   On: 01/23/2022 11:29   DG Swallowing Func-Speech Pathology  Result Date: 01/22/2022 Table formatting from the original result was not included. Objective Swallowing Evaluation: Type of Study: MBS-Modified Barium Swallow Study  Patient Details Name: [name] MRN: [id number] Date of Birth: 07-05-1940 Today's Date: 01/22/2022 Time: SLP Start Time (ACUTE ONLY): 58 -SLP Stop Time (ACUTE ONLY): 0762 SLP Time Calculation (min) (ACUTE ONLY): 23 min Past Medical History: Past Medical History: Diagnosis Date  Ankylosing spondylitis ([place]) 11/07/2018  Asthma   BPH (benign prostatic hyperplasia)   CAD (coronary artery disease) 01/1995  CHF (congestive heart failure) ([place])   Clostridium difficile colitis 04/2005  Colitis 2011  COPD (chronic obstructive pulmonary disease) ([name])   Depression   Diverticulosis   DJD (degenerative joint disease)   Gastric ulcer 04/17/10  Three 68m gastric ulcers, H.pylori serologies were negative  GERD (gastroesophageal reflux disease)   History of kidney stones   Hyperlipidemia   Hypertension   Idiopathic chronic inflammatory bowel disease 05/18/2010  left-sided UC  Kidney stone   Morbid obesity ([place] 03/12/2018  Obstructive sleep apnea   on Cpap  Reflux 02/1995  S/P endoscopy 07/24/10  retained gastric contents, benign bx Past Surgical History: Past Surgical History: Procedure Laterality Date  ANORECTAL MANOMETRY  2016  [facility]: concern for possible fissure. Noted pelvic floor dyssnyergy  BIOPSY  07/29/2017  Procedure: BIOPSY;  Surgeon: RDaneil Dolin MD;  [facility];  Service: Endoscopy;;  ascending and sigmoid colon  CARDIAC CATHETERIZATION    with stent  CARDIOVASCULAR STRESS TEST  07/21/2009  No scintigraphic evidence of inducible myocardial  ischemia  CARPAL TUNNEL RELEASE Left 01/17/2017  Procedure: LEFT CARPAL TUNNEL RELEASE;  Surgeon: HCarole Civil MD;  Location: [facility];  Service: Orthopedics;  Laterality: Left;  CATARACT EXTRACTION, BILATERAL Bilateral  CERVICAL SPINE SURGERY    C4-5  COLONOSCOPY  04/2005  granularity and friability erosions from rectum to 40cm. Bx infection vs IBD. C. Diff positive at the time.   COLONOSCOPY  05/2010  [facility]: left-sided UC, bx with no dysplasia, shallow diverticula  COLONOSCOPY [name] 11/07/2012  [id number] proctocolitis status post segmental biopsy/Sigmoid colon polyps removed as described above. Procedure compromisd by technical difficulties. bx: Inflammation limited to sigmoid and rectum on pathology.  COLONOSCOPY  04/2014  Dr. Nyoka Cow[facility] at [facility]: well localized proctocolitis limited to sigmoid  COLONOSCOPY WITH PROPOFOL [name] 07/29/2017  diverticulosis in colon, three 4-6 mm polyps at splenic flexure and in cecum, one 10 mm polyp in rectum, abnormal rectum and sigmoid consistent with active UC  CORONARY STENT PLACEMENT  01/1995  ESOPHAGEAL DILATION  01/20/2022  Procedure: ESOPHAGEAL DILATION;  Surgeon: Otis Brace, MD;  Location: [facility];  Service: Gastroenterology;;  ESOPHAGOGASTRODUODENOSCOPY  07/24/2010  UEK:[id number] esophagus  ESOPHAGOGASTRODUODENOSCOPY  04/2014  Dr. Nyoka Cowden at [facility]: negative small bowel biopsies  ESOPHAGOGASTRODUODENOSCOPY (EGD) WITH PROPOFOL [name] 01/20/2022  Procedure: ESOPHAGOGASTRODUODENOSCOPY (EGD) WITH PROPOFOL;  Surgeon: Otis Brace, MD;  Location: [facility];  Service: Gastroenterology;  Laterality: [name];  FOOT SURGERY Bilateral two  KNEE SURGERY  two  POLYPECTOMY  07/29/2017  Procedure: POLYPECTOMY;  Surgeon: Daneil Dolin, MD;  [facility];  Service: Endoscopy;;  splenic flexure, ascending colon polyp;rectal  SHOULDER SURGERY  two  TONSILLECTOMY    TRANSTHORACIC ECHOCARDIOGRAM  03/23/2009  EF 60-65%, normal LV systolic function  ULNAR HEAD EXCISION Left  01/17/2017  Procedure: LEFT ULNAR HEAD RESECTION;  Surgeon: Carole Civil, MD;  Location: [facility];  Service: Orthopedics;  Laterality: Left; HPI: ASHYR HEDGEPATH is a 81 y.o. male who [facility] ED 12/12 with dyspnea. He had experienced intermittent non bilious emesis earlier in the week and on 12/11, he developed fever with Tmax of 101.8.  He apparently has had a steady increase in his weight over the past few weeks, unsure exactly how much.  CXR 12/14: "Bibasilar airspace disease, not significantly changed from prior,  compatible with pneumonia."  Pt seen by outpatient SLP 12/4 with recommendations for MBSS (OP MBS scheduled for 12/20).  Esophagram 12/14 with no passage in distal esophagus.  Pt now s/p EGD with dilation of Schatzki Ring on 12/16.  Pt has a PMH including but not limited to COPD on 4L O2, OSA on CPAP, CHF.  Subjective: pt alert and cooperative, says he usually lies completely flat while drinking  Recommendations for follow up therapy are one component of a multi-disciplinary discharge planning process, led by the attending physician.  Recommendations may be updated based on patient status, additional functional criteria and insurance authorization. Assessment / Plan / Recommendation   01/22/2022   2:00 PM Clinical Impressions Clinical Impression Pt has a mild oropharyngeal dysphagia that is multifactorial in nature given chronic structural issues in the setting of more acute decline in respiratory status and overall conditioning. Note that images were somewhat limited by shadows from pt shoulder, and he said he could not move his neck or shoulders throughout the study to either help with visibility or attempt postural strategies. Pt has mildly reduced bolus cohesion with thin liquids, with thin liquids spilling into the pyriform sinuses prior to the swallow. He has reduced anterior hyoid excursion and base of tongue retraction, contributing to intermittently incomplete epiglottic  inversion. Pt also has prior cervical hardware that appears to impact full epiglottic deflection. With smaller cup sips of thin  liquids and with solid boluses he can still adequately protect his airway (PAS 2-3 with thin liquids). When he drinks via straw he has an episode of silent aspiration (PAS 8) as well as additional deeper penetration that does not clear (still PAS 3). Note that coughing was noted intermittently throughout testing and is not necessarily correlated with aspiration. This happens as a result of aforementioned deficits in the setting of larger boluses that make it more challenging for pt to compensate and coordinate for consistent airway protection. Pt has improved airway protection (PAS 2) with nectar thick liquids regardless of delivery method and rate. Discussed options to try to reduce aspiration risk with pt, which include continuing on thin liquids via small cup sips with careful use of precautions, or alternatively trying nectar thick liquids at least temporarily in the setting of acute decompensation. Pt had a hard time answering the question, as he kept asking for buttermilk. Given some of the challenges he had during this study wtih adhering to recommended strategies, would favor trialing nectar thick liquids with his Dys 3 diet, with ongoing SLP f/u to try to facilitate return to thin liquids. SLP Visit Diagnosis Dysphagia, oropharyngeal phase (R13.12) Impact on safety and function Mild aspiration risk;Moderate aspiration risk     01/22/2022   2:00 PM Treatment Recommendations Treatment Recommendations Therapy as outlined in treatment plan below     01/22/2022   2:00 PM Prognosis Prognosis for Safe Diet Advancement Good   01/22/2022   2:00 PM Diet Recommendations SLP Diet Recommendations Dysphagia 3 (Mech soft) solids;Nectar thick liquid Liquid Administration via Cup;Straw Medication Administration Whole meds with puree Compensations Slow rate;Small sips/bites Postural Changes Seated  upright at 90 degrees;Remain semi-upright after after feeds/meals (Comment)     01/22/2022   2:00 PM Other Recommendations Oral Care Recommendations Oral care BID Follow Up Recommendations Skilled nursing-short term rehab (<3 hours/day) Functional Status Assessment Patient has had a recent decline in their functional status and demonstrates the ability to make significant improvements in function in a reasonable and predictable amount of time.   01/22/2022   2:00 PM Frequency and Duration  Speech Therapy Frequency (ACUTE ONLY) min 2x/week Treatment Duration 2 weeks     01/22/2022   2:00 PM Oral Phase Oral Phase Impaired Oral - Thin Teaspoon WFL Oral - Thin Cup Decreased bolus cohesion;Lingual/palatal residue Oral - Thin Straw Decreased bolus cohesion;Lingual/palatal residue;Premature spillage Oral - Puree Reduced posterior propulsion Oral - Regular Reduced posterior propulsion    01/22/2022   2:00 PM Pharyngeal Phase Pharyngeal Phase Impaired Pharyngeal- Nectar Cup Reduced anterior laryngeal mobility;Penetration/Aspiration before swallow Pharyngeal Material enters airway, remains ABOVE vocal cords then ejected out Pharyngeal- Nectar Straw Reduced anterior laryngeal mobility;Penetration/Aspiration before swallow Pharyngeal Material enters airway, remains ABOVE vocal cords then ejected out Pharyngeal- Thin Teaspoon Reduced tongue base retraction;Reduced anterior laryngeal mobility Pharyngeal- Thin Cup Reduced anterior laryngeal mobility;Reduced tongue base retraction;Pharyngeal residue - valleculae;Penetration/Aspiration before swallow;Reduced epiglottic inversion Pharyngeal Material enters airway, remains ABOVE vocal cords and not ejected out Pharyngeal- Thin Straw Reduced anterior laryngeal mobility;Reduced tongue base retraction;Pharyngeal residue - valleculae;Penetration/Aspiration before swallow;Reduced epiglottic inversion Pharyngeal Material enters airway, passes BELOW cords without attempt by patient to eject  out (silent aspiration) Pharyngeal- Puree Reduced anterior laryngeal mobility Pharyngeal- Regular Reduced epiglottic inversion;Reduced anterior laryngeal mobility    01/22/2022   2:00 PM Cervical Esophageal Phase  Cervical Esophageal Phase -- Osie Bond., M.A. [place] Office [phone] Secure chat preferred 01/22/2022, 3:25 PM  DG Chest 2 View  Result Date: 01/22/2022 CLINICAL DATA:  Shortness of breath.  Hypoxia. EXAM: CHEST - 2 VIEW COMPARISON:  01/20/2022 FINDINGS: Cardiomegaly. Aortic atherosclerosis. Patchy density at the lung bases consistent with atelectasis/pneumonia. No dense consolidation, collapse or effusion. No acute bone finding. IMPRESSION: Patchy density at the lung bases consistent with atelectasis/pneumonia. Electronically Signed   By: Nelson Chimes M.D.   On: 01/22/2022 14:27    Cardiac Studies   TTE 01/17/22: IMPRESSIONS     1. Left ventricular ejection fraction, by estimation, is 55 to 60%. The  left ventricle has normal function. The left ventricle has no regional  wall motion abnormalities. There is moderate concentric left ventricular  hypertrophy. Indeterminate diastolic  filling due to E-A fusion.   2. Right ventricular systolic function was not well visualized. The right  ventricular size is not well visualized.   3. The mitral valve was not well visualized. No evidence of mitral valve  regurgitation.   4. The aortic valve was not well visualized. Aortic valve regurgitation  is not visualized. No aortic stenosis is present.   5. Aortic dilatation noted. There is mild dilatation of the aortic root,  measuring 41 mm.   6. The inferior vena cava is normal in size with greater than 50%  respiratory variability, suggesting right atrial pressure of 3 mmHg.   Comparison(s): No significant change from prior study.   Patient Profile     81 y.o. male hx of CAD, COPD on 4LNC, OSA, Dementia and HFpEF who presented with  worsening SOB, fever and LE edema found to have multifocal pneumonia and acute diastolic HF. Cardiology is consulted regarding management of acute diastolic HF.  Assessment & Plan    #Acute on chronic diastolic heart failure:  Patient presented with respiratory distress found to have multifocal pneumonia and acute on chronic diastolic HF exacerbation. TTE with EF 55-60%, moderate LVH, indeterminate diastolic function. Responded well to lasix IV BID. Cr bumped overnight and respiratory status overall improved. Will transition to PO lasix today. -Change to lasix 66m PO daily -Continue spironolactone 12.539mdaily -Trend I/Os and daily weights -BP control as below  #Acute on chronic respiratory failure with hypoxia:  Likely multifactorial due to CAP, acute on chronic diastolic heart failure, COPD. -Transition to oral lasix as above -Abx per primary team -On BiPAP currently  #Hypertensive urgency:  Initially required Nitro gtt, now with improvement in pressures and transitioned to PO. Blood pressure elevated at 140s. Will adjust medications as below. -Continue amlodipine 1062maily -Continue coreg 12.5mg74mD -Continue imdur 90mg1mly -Continue spironolactone 12.5mg d58my -Start hydralazine 25mg T70m#Elevated troponin:  Likely demand ischemia in setting of acute illness.  Peaked 540. Will continue with medical management as above. -Continue ASA 81mg da65m-Continue pravastatin 40mg dai8m#Esophageal stricture:  Noted on esophagram, underwent an EGD 12/17 which showed lower esophageal ring which was dilated  #AKI:  -Monitor with diuresis  #HLD:  -On pravastatin 40mg dail9mor questions or updates, please contact [facility] [email or web address] for contact info under        Signed, Francee Setzer E Freada Bergeron0/2023, 8:37 AM

## 2022-01-23 NOTE — Progress Notes (Signed)
PT Cancellation Note  Patient Details Name: [name] MRN: [id number] DOB: 26-Oct-1940   Cancelled Treatment:    Reason Eval/Treat Not Completed: (P) Medical issues which prohibited therapy (per chart review and discussion with RN, pt with increased O2 needs and likely to be transferred to progressive care unit, will defer PT session until he is more medically stable as pt may need PT re-eval next session pending progress.)   Carlene Coria 01/23/2022, 2:39 PM

## 2022-01-23 NOTE — Progress Notes (Signed)
RT note- Patient was seen this morning and received breathing treatments. I was informed not in report but by his daughter that he did not wear his Bipap last night. Stated that it was too much pressure and blowing in his face, so he did not wear it, but remained n 8L salter [place]. Called back to room for stat ABG stating that the patient was somnolent. MD and RN at the bedside. Patient was explained to that I needed to get some blood, of which he did not want me too. Patient was stuck twice. Obtained in right brachial  after missing right radial. Bipap then was placed on patient at 12 L/min bleed. Continue to monitor.

## 2022-01-24 ENCOUNTER — Ambulatory Visit (HOSPITAL_COMMUNITY): Payer: Medicare Other

## 2022-01-24 ENCOUNTER — Ambulatory Visit: Payer: Medicare Other | Admitting: Psychologist

## 2022-01-24 ENCOUNTER — Encounter (HOSPITAL_COMMUNITY): Payer: Medicare Other

## 2022-01-24 DIAGNOSIS — I169 Hypertensive crisis, unspecified: Secondary | ICD-10-CM | POA: Diagnosis not present

## 2022-01-24 DIAGNOSIS — J189 Pneumonia, unspecified organism: Secondary | ICD-10-CM | POA: Diagnosis not present

## 2022-01-24 DIAGNOSIS — J9621 Acute and chronic respiratory failure with hypoxia: Secondary | ICD-10-CM | POA: Diagnosis not present

## 2022-01-24 DIAGNOSIS — I5033 Acute on chronic diastolic (congestive) heart failure: Secondary | ICD-10-CM | POA: Diagnosis not present

## 2022-01-24 LAB — CBC
HCT: 37.5 % — ABNORMAL LOW (ref 39.0–52.0)
Hemoglobin: 12.2 g/dL — ABNORMAL LOW (ref 13.0–17.0)
MCH: 30.6 pg (ref 26.0–34.0)
MCHC: 32.5 g/dL (ref 30.0–36.0)
MCV: 94 fL (ref 80.0–100.0)
Platelets: 260 10*3/uL (ref 150–400)
RBC: 3.99 MIL/uL — ABNORMAL LOW (ref 4.22–5.81)
RDW: 13.4 % (ref 11.5–15.5)
WBC: 12.2 10*3/uL — ABNORMAL HIGH (ref 4.0–10.5)
nRBC: 0 % (ref 0.0–0.2)

## 2022-01-24 LAB — COMPREHENSIVE METABOLIC PANEL
ALT: 31 U/L (ref 0–44)
AST: 40 U/L (ref 15–41)
Albumin: 2.3 g/dL — ABNORMAL LOW (ref 3.5–5.0)
Alkaline Phosphatase: 84 U/L (ref 38–126)
Anion gap: 8 (ref 5–15)
BUN: 34 mg/dL — ABNORMAL HIGH (ref 8–23)
CO2: 28 mmol/L (ref 22–32)
Calcium: 10.4 mg/dL — ABNORMAL HIGH (ref 8.9–10.3)
Chloride: 101 mmol/L (ref 98–111)
Creatinine, Ser: 1.4 mg/dL — ABNORMAL HIGH (ref 0.61–1.24)
GFR, Estimated: 50 mL/min — ABNORMAL LOW (ref >60)
Glucose, Bld: 142 mg/dL — ABNORMAL HIGH (ref 70–99)
Potassium: 4.2 mmol/L (ref 3.5–5.1)
Sodium: 137 mmol/L (ref 135–145)
Total Bilirubin: 0.7 mg/dL (ref 0.3–1.2)
Total Protein: 6.6 g/dL (ref 6.5–8.1)

## 2022-01-24 MED ORDER — FUROSEMIDE 10 MG/ML IJ SOLN: 40.0000 mg | Freq: Once | INTRAMUSCULAR | Status: DC

## 2022-01-24 MED ORDER — HYDRALAZINE HCL 25 MG PO TABS
25.0000 mg | ORAL_TABLET | Freq: Three times a day (TID) | ORAL | Status: DC
  Administered 2022-01-24 – 2022-01-25 (×3): 25 mg via ORAL
  Filled 2022-01-24 (×3): qty 1

## 2022-01-24 MED ORDER — POTASSIUM CHLORIDE CRYS ER 20 MEQ PO TBCR: 20.0000 meq | EXTENDED_RELEASE_TABLET | Freq: Every day | ORAL | Status: DC

## 2022-01-24 MED ORDER — FUROSEMIDE 40 MG PO TABS
80.0000 mg | ORAL_TABLET | Freq: Every day | ORAL | Status: DC
  Administered 2022-01-24 – 2022-01-25 (×2): 80 mg via ORAL
  Filled 2022-01-24 (×2): qty 2

## 2022-01-24 NOTE — Progress Notes (Signed)
Physical Therapy Treatment Patient Details Name: [name] MRN: [id number] DOB: 1941/02/04 Today's Date: 01/24/2022   History of Present Illness Pt is an 81 y/o male who presents 01/16/22 with SOB, cough, choking on food at home. He was found to have PNA and then respiratory failure. Pt underwent MBS and EGD during admission as well. PMH significant for Ankylosing spondylitis, CAD, CHF, HTN, COPD.    PT Comments    Pt is supine in bed, asleep with mouth open, SpO2 >90%O2 on 4L O2 via [place]. With increased auditory and tactile cuing pt rouses. Daughter in room providing encouragement to get up and out of bed. Pt limited in safe mobility by decreased strength, mobility and endurance. Pt is currently modA for bed mobility, modAx2 for transfers. With standing pt found to be incontinent of stool and able to stand while washcloths retrieved for pericare. Pt min A for stepping transfer to recliner. Pt with improved oxygenation with deep breaths of excursion. Provided pt with incentive spirometer and practiced improved inhalation. Pt continue to recommend SNF however given family support pt likely to progress to discharge home with HHPT. PT will continue to follow acutely.     Recommendations for follow up therapy are one component of a multi-disciplinary discharge planning process, led by the attending physician.  Recommendations may be updated based on patient status, additional functional criteria and insurance authorization.  Follow Up Recommendations  Skilled nursing-short term rehab (<3 hours/day) Can patient physically be transported by private vehicle: No   Assistance Recommended at Discharge Frequent or constant Supervision/Assistance  Patient can return home with the following Two people to help with walking and/or transfers;A lot of help with bathing/dressing/bathroom;Assistance with cooking/housework;Assist for transportation;Help with stairs or ramp for entrance   Equipment  Recommendations   (TBD by next venue of care)       Precautions / Restrictions Precautions Precautions: Fall Precaution Comments: monitor O2 Restrictions Weight Bearing Restrictions: No     Mobility  Bed Mobility Overal bed mobility: Needs Assistance Bed Mobility: Supine to Sit     Supine to sit: Mod assist     General bed mobility comments: pt able to manage LE off bed, pt pulls up against therapist to come to EoB and bring trunk to upright    Transfers Overall transfer level: Needs assistance Equipment used: Rolling walker (2 wheels) Transfers: Sit to/from Stand Sit to Stand: Mod assist, +2 physical assistance   Step pivot transfers: Min assist, +2 safety/equipment       General transfer comment: modAx2 for power up and steadying at RW, min A for steadying with stepping from bed to chair    Ambulation/Gait               General Gait Details: deferred       Balance Overall balance assessment: Needs assistance Sitting-balance support: Feet supported, No upper extremity supported Sitting balance-Leahy Scale: Fair     Standing balance support: During functional activity Standing balance-Leahy Scale: Poor Standing balance comment: Reliant on UE support, found to be incontinent of stool on standing and able to maintain static standing with min outside support for >2 min                            Cognition Arousal/Alertness: Awake/alert Behavior During Therapy: WFL for tasks assessed/performed Overall Cognitive Status: Difficult to assess  Safety/Judgement: Decreased awareness of safety, Decreased awareness of deficits     General Comments: continues to have some decreased awareness of his deficits        Exercises General Exercises - Lower Extremity Long Arc Quad: AROM, Both, 10 reps, Seated Hip Flexion/Marching: AROM, Both, 10 reps, Standing Other Exercises Other Exercises: IS x 10, max inhale  1750 mL    General Comments General comments (skin integrity, edema, etc.): daughters at bedside, report he will work for therapist but as soon as HHPT/OT runs out he does not get up and move around, pt on 4L O2 via [place] and able to maintain SpO2 >90%, improved oxygenation >95% with exertion, able to reproduce with use of incentive spirometer      Pertinent Vitals/Pain Pain Assessment Pain Assessment: Faces Faces Pain Scale: Hurts little more Pain Location: generalized wtih movement Pain Descriptors / Indicators: Discomfort, Grimacing Pain Intervention(s): Limited activity within patient's tolerance, Monitored during session, Repositioned     PT Goals (current goals can now be found in the care plan section) Acute Rehab PT Goals Patient Stated Goal: Return home PT Goal Formulation: With patient/family Time For Goal Achievement: 02/04/22 Potential to Achieve Goals: Good Progress towards PT goals: Progressing toward goals    Frequency    Min 2X/week      PT Plan Current plan remains appropriate    Co-evaluation PT/OT/SLP Co-Evaluation/Treatment: Yes Reason for Co-Treatment: For patient/therapist safety          AM-PAC PT "6 Clicks" Mobility   Outcome Measure  Help needed turning from your back to your side while in a flat bed without using bedrails?: A Lot Help needed moving from lying on your back to sitting on the side of a flat bed without using bedrails?: A Lot Help needed moving to and from a bed to a chair (including a wheelchair)?: Total Help needed standing up from a chair using your arms (e.g., wheelchair or bedside chair)?: Total Help needed to walk in hospital room?: Total Help needed climbing 3-5 steps with a railing? : Total 6 Click Score: 8    End of Session Equipment Utilized During Treatment: Gait belt;Oxygen Activity Tolerance: Patient tolerated treatment well Patient left: in chair;with call bell/phone within reach;with chair alarm set;with  family/visitor present Nurse Communication: Mobility status PT Visit Diagnosis: Unsteadiness on feet (R26.81);Pain Pain - Right/Left: Left Pain - part of body: Ankle and joints of foot     Time: 6203-5597 PT Time Calculation (min) (ACUTE ONLY): 34 min  Charges:  $Therapeutic Activity: 8-22 mins                     Elizah Lydon B. Migdalia Dk PT, DPT Acute Rehabilitation Services Please use secure chat or  Call Office [phone]    [place] 01/24/2022, 3:07 PM

## 2022-01-24 NOTE — Progress Notes (Signed)
Progress Note   Patient: [name] ION:[id number] DOB: 17-Sep-1940 DOA: 01/16/2022     7 DOS: the patient was seen and examined on 01/24/2022        Brief hospital course: [name] is an 81 y.o. M with COPD and dCHF and morbid obesity BMI 40.4 with chronic respiratory failure on 4L home O2, HTN, OSA on BiPAP, HLD and CAD s/p PCI >55yr ulcerative colitis not on therapy who presented with dyspnea, fever and lower extremity swelling.  CTA in the ER showed bilateral lower lobe and RML airspace opacities. BP >180/80.  In respiratory distress requiring BiPAP on admission to ICU with antibiotics/diuretics.    12/13: Admitted on antibiotics, diuretics 12/14: Urine strep antigen positive; barium esophagram showed narrowing, GI consulted 12/16: EGD with schatzki ring, dilated successully 12/17-12/19: Diuresing, attempting to wean O2 12/20: Down to 5L (close to home 4L), transitioned to oral diuretics     Assessment and Plan: * Acute on chronic hypoxic respiratory failure ([name]) Multifactorial, largely driven by resolving CHF and pneumonia, possibly also obesity/OSA and atelectasis.  ABG and CXR 12/19 were stable/normal, no significant effusions.  Today weaned to 5L, close to baseline 4L.       Acute on chronic diastolic heart failure ([name]) Net negative 3.3L yesterday 13L on admission, Cr and BUN very slightly up.  - Cardiology have changed to oral lasix today.  Fluid status not possible to estimate clinically due to habitus. We tried transitioning to oral lasix over the weekend (7 liters ago) and he required resumption of IV Lasix, but for now, will watch I/Os overnight, watch Cr and decide about oral vs IV Lasix tomorrow - Monitor Cr and Potassium - Strict I/Os, daily weights, daily BMP     Community acquired pneumonia of bilateral lower lobes and right middle lobe due to Streptoccocus pneumoniae Urine S pneumo antigen positive earlier in this hospital stay, but still  with low grade temps 99-100F, infiltrates on CXR, sputum and WBC >12K.  I am concerned about an aspiration, or about treatment failure if we stop after a standard 5 day course.  I have sent a sputum for culture. - Continue Rocephin day 9 - Follow sputum - Aggressive pulmonary toilet ordered, Flutter and IS, mobilization    Hypertensive crisis BP > 180 on admission.  Prior to admission, only on metoprolol and furosemide reportedly. Now controlled on 6 agents - Continue furosemide, Coreg, spironolactone, hydralazine, Imdur, amlodipine    Esophageal dysphagia On admission, patient complained of several months dysphagia, including regurgitating undigested food.  Here, barium esophagram showed narrowing / stenosis of the distal esophagus with failure of the barium tab to pass   GI consulted and EGD 12/16 showed schatzki ring, which was dilated successfully, no malignant appearing lesions.  EGD appeared to show some esophageal dysmotility at well.    Evaluated by SLP who noticed a little oropharyngeal dysphagia/aspiration, recommended soft and nectar thick.  Has been doing well on that although has repeatedly ask for liberalizing diet. - Continue speech therapy - I suspect as his overall functional status and deconditioning improves, his oropharyngeal dysphagia will improve      Hypokalemia Resolved with supplementation and starting spironolactone.  Myocardial injury Due to CHF, pneumonia.  No ischemia.  Stage 3a chronic kidney disease (CKD) ([place] Acute kidney injury ruled out.  Baseline 1.1-1.3.  Cr here up to 1.5, improved steadily with diruesis, today slightly up to 1.4.  Depression, major, single episode, moderate ([name]) - Continue Cymbalta  Chronic  respiratory failure with hypoxia ([name]) - Continue supplemental O2  COPD (chronic obstructive pulmonary disease) ([place]) He does not have a frank COPD exacerbation, but seems to be improving with addition of Pulmicort and scheduled  bronchodilators - Continue new Pulmicort - Continue scheduled and PRN albuterol  - Aggressive pulmonary toilet  Morbid obesity ([name]) BMI 40.4  Dyslipidemia, goal LDL below 70 - Continue pravastatin  Essential hypertension See above  CAD S/P percutaneous coronary angioplasty - Continue aspirin, pravastatin, Coreg  OSA treated with BiPAP - Continue BiPAP at night          Subjective: [place] better today.  Wants to go home.  Denies dyspnea or pains. Resentful of having an ABG yesterday.  No fever, no confusion.       Physical Exam: BP (!) 144/55   Pulse 68   Temp 98.3 F (36.8 C) (Oral)   Resp 16   Ht 6' 1"  (1.854 m)   Wt (!) 137.4 kg   SpO2 92%   BMI 39.98 kg/m   Obese adult male, lying in bed, no acute distress, appears debilitated RRR, heart sounds distant due to body habitus, pitting edema in lower extremities, JVP not visible Respiratory rate seems normal, but lung sounds diminished, excursion reduced due to body habitus, no rales or wheezes appreciated Abdomen rotund, no grimace to palpation, no masses appreciated Attentive to our conversation, affect blunted, severe generalized weakness   Data Reviewed: White blood cell count 12, no change Hemoglobin normal Creatinine up to 1.4 Sputum culture pending    Family Communication: Daughter at the bedside     Disposition: Status is: Inpatient Adult male admitted for congestive heart failure and pneumonia  He is nearing the end of diuresis, cardiology recommend switching to oral Lasix    If his oxygen requirements are 4 to 5 L again tomorrow, his output is okay on oral Lasix, he could potentially discharge to SNF as early as tomorrow        Author: Edwin Dada, MD 01/24/2022 1:28 PM  For on call review [email or web address].

## 2022-01-24 NOTE — TOC Progression Note (Addendum)
Transition of Care [facility]) - Progression Note    Patient Details  Name: [name] MRN: [id number] Date of Birth: [birth year]  Transition of Care [facility]) CM/SW [place], LCSW Phone Number: 01/24/2022, 12:54 PM  Clinical Narrative:    12:54am-CSW reaching out to SNFs; currently no beds available.    2pm-[facility] able to accept patient tomorrow. [facility] may [facility] a bed opening Friday. Attempted to contact both daughters; no voicemail available for either. CSW will check bedside. Patient has his home Bipap here to bring to SNF. Will initiated insurance authorization process pending a facility choice.   3:45pm-CSW spoke with both daughters. They are requesting 1-[facility], 2-[facility] (daughter was there for rehab). [facility] checking but unsure if they will have a "bari" bed.  Planned Disposition: Skilled Nursing Facility Barriers to Discharge: Ship broker, SNF Pending bed offer  Expected Discharge Plan and Services In-house Referral: Clinical Social Work   Post Acute Care Choice: [place] Living arrangements for the past 2 months: [place] Determinants of Health (SDOH) Interventions SDOH Screenings   Food Insecurity: No Food Insecurity (01/18/2022)  Housing: Low Risk  (01/18/2022)  Transportation Needs: No Transportation Needs (01/18/2022)  Utilities: Not At Risk (01/18/2022)  Alcohol Screen: Low Risk  (01/31/2021)  Depression (PHQ2-9): High Risk (12/21/2021)  Financial Resource Strain: Low Risk  (01/31/2021)  Physical Activity: Inactive (01/31/2021)  Social Connections: Moderately Integrated (01/31/2021)  Stress: Stress Concern Present (01/31/2021)  Tobacco Use: Medium Risk (01/22/2022)    Readmission Risk Interventions     No data to display

## 2022-01-24 NOTE — Progress Notes (Addendum)
PROGRESS NOTE    [name]  WGN:[id number] DOB: 1941/01/04 DOA: 01/16/2022 PCP: Babs Sciara, MD  81 y.o. M with COPD and dCHF and morbid obesity BMI 40.4 with chronic respiratory failure on 4L home O2, HTN, OSA on BiPAP, HLD and CAD s/p PCI >7yr, ulcerative colitis  presented with dyspnea, fever and lower extremity swelling. CT B/L lower lobe and RML airspace opacities.  In respiratory distress requiring BiPAP on admission to ICU with antibiotics/diuretics. 12/12" foley placed for retention 12/13: Admitted on antibiotics, diuretics 12/14: Urine strep antigen positive; barium esophagram showed narrowing, GI consulted 12/16: EGD with schatzki ring, dilated successully 12/17-12/19: Diuresing, attempting to wean O2 12/20: Down to 5L (close to home 4L), transitioned to oral diuretics    Subjective: Feels okay, no events overnight, still has Foley catheter   Assessment and Plan:  Acute on chronic hypoxic respiratory failure ([name]) Multifactorial, largely driven by resolving CHF and pneumonia, possibly also COPD, obesity/OSA and atelectasis. -weaned to 5L, close to baseline 4L.    -Discharge planning, TOC following, plan for SNF tomorrow, voiding trial, DC Foley catheter today  Acute on chronic diastolic heart failure Cards following ECHo w/ EF 55-60%, mod LVH, indeterminate DD -diuresed well on IV lasix, 13L neg -Cards following -changed to PO lasix, hydralazine, Imdur, Aldactone and Coreg -volume status difficult to assess, appears close to euvolemic   Aspiration PNA  Urine S pneumo antigen positive earlier in this hospital stay, infiltrates on CXR, sputum and WBC >12K. -concern for aspiration,, w/ Esoph and oropharyngeal dysphagia - Completed 8-9 days of Abx, Dysphagia diet - Sputum cultures polymicrobial - Aggressive pulmonary toilet ordered, Flutter and IS, mobilization  Hypertensive crisis -Improving - Continue furosemide, Coreg, spironolactone, hydralazine, Imdur,  amlodipine  Esophageal dysphagia On admission, patient complained of several months dysphagia, including regurgitating undigested food.  Here, barium esophagram showed narrowing / stenosis of the distal esophagus with failure of the barium tab to pass  -GI consulted and EGD 12/16 showed schatzki ring, which was dilated successfully, no malignant appearing lesions.  EGD appeared to show some esophageal dysmotility at well.   -Evaluated by SLP who noticed a little oropharyngeal dysphagia/aspiration, initially on dysphagia diet now, now upgraded to soft diet/thin liquids - Continue speech therapy  Hypokalemia Resolved   Myocardial injury Due to CHF, pneumonia.  No ischemia.  Stage 3a chronic kidney disease (CKD) ([name]) Acute kidney injury ruled out.  Baseline 1.1-1.3.  Cr here up to 1.5, improved steadily with diruesis,  -Stable 1.4  Depression, major, single episode, moderate ([name]) - Continue Cymbalta  Chronic respiratory failure with hypoxia ([name]) - Continue supplemental O2  COPD (chronic obstructive pulmonary disease) ([name]) He does not have a frank COPD exacerbation, but seems to be improving with addition of Pulmicort and scheduled bronchodilators - Continue new Pulmicort - Continue scheduled and PRN albuterol  - Aggressive pulmonary toilet  Urinary retention -foley placed 12/12, continue flomax -attempt voiding trial today, DC Foley  Morbid obesity ([name]) BMI 40.4  Dyslipidemia, goal LDL below 70 - Continue pravastatin  Essential hypertension See above  CAD S/P percutaneous coronary angioplasty - Continue aspirin, pravastatin, Coreg  OSA treated with BiPAP - Continue BiPAP at night  DVT prophylaxis: Hep SQ Code Status: DNR Family Communication: daughter Disposition Plan: SNF tomorrow  Consultants: Cards   Procedures:   Antimicrobials:    Objective: Vitals:   01/24/22 0838 01/24/22 0852 01/24/22 1425 01/24/22 1716  BP:  (!) 144/55 118/73 118/73  Pulse:  63 68 69  64  Resp: 16  16   Temp:   97.8 F (36.6 C)   TempSrc:   Axillary   SpO2: 94% 92% 96%   Weight:      Height:        Intake/Output Summary (Last 24 hours) at 01/24/2022 1918 Last data filed at 01/24/2022 1700 Gross per 24 hour  Intake 100 ml  Output 2850 ml  Net -2750 ml   Filed Weights   01/21/22 0550 01/22/22 0500 01/24/22 0633  Weight: (!) 138.7 kg (!) 137.2 kg (!) 137.4 kg    Examination:     Data Reviewed:   CBC: Recent Labs  Lab 01/20/22 0259 01/21/22 0243 01/22/22 0429 01/23/22 0400 01/24/22 0151  WBC 12.2* 14.8* 12.0* 12.7* 12.2*  HGB 13.9 13.0 13.8 12.4* 12.2*  HCT 42.0 40.0 41.9 38.7* 37.5*  MCV 91.1 92.2 92.7 93.9 94.0  PLT 270 265 243 275 260   Basic Metabolic Panel: Recent Labs  Lab 01/18/22 0426 01/19/22 0419 01/20/22 0259 01/21/22 0243 01/22/22 0429 01/23/22 0400 01/24/22 0151  NA 140   < > 140 137 136 135 137  K 3.4*   < > 3.6 3.6 3.4* 3.8 4.2  CL 103   < > 101 100 98 101 101  CO2 27   < > 28 28 30 26 28   GLUCOSE 118*   < > 127* 139* 138* 136* 142*  BUN 22   < > 22 30* 30* 25* 34*  CREATININE 1.50*   < > 1.39* 1.42* 1.33* 1.21 1.40*  CALCIUM 9.7   < > 10.0 9.8 10.3 9.9 10.4*  MG 2.2  --   --  2.2 2.3  --   --   PHOS 3.2  --   --   --   --   --   --    < > = values in this interval not displayed.   GFR: Estimated Creatinine Clearance: 58.2 mL/min (A) (by C-G formula based on SCr of 1.4 mg/dL (H)). Liver Function Tests: Recent Labs  Lab 01/22/22 0429 01/23/22 0400 01/24/22 0151  AST 32 33 40  ALT 27 28 31   ALKPHOS 90 82 84  BILITOT 0.6 0.4 0.7  PROT 6.6 6.3* 6.6  ALBUMIN 2.4* 2.2* 2.3*   No results for input(s): "LIPASE", "AMYLASE" in the last 168 hours. No results for input(s): "AMMONIA" in the last 168 hours. Coagulation Profile: No results for input(s): "INR", "PROTIME" in the last 168 hours. Cardiac Enzymes: No results for input(s): "CKTOTAL", "CKMB", "CKMBINDEX", "TROPONINI" in the last 168 hours. BNP  (last 3 results) No results for input(s): "PROBNP" in the last 8760 hours. HbA1C: No results for input(s): "HGBA1C" in the last 72 hours. CBG: No results for input(s): "GLUCAP" in the last 168 hours. Lipid Profile: No results for input(s): "CHOL", "HDL", "LDLCALC", "TRIG", "CHOLHDL", "LDLDIRECT" in the last 72 hours. Thyroid Function Tests: No results for input(s): "TSH", "T4TOTAL", "FREET4", "T3FREE", "THYROIDAB" in the last 72 hours. Anemia Panel: No results for input(s): "VITAMINB12", "FOLATE", "FERRITIN", "TIBC", "IRON", "RETICCTPCT" in the last 72 hours. Urine analysis:    Component Value Date/Time   COLORURINE YELLOW 08/16/2019 2149   APPEARANCEUR CLEAR 08/16/2019 2149   LABSPEC 1.025 08/16/2019 2149   PHURINE 5.0 08/16/2019 2149   GLUCOSEU NEGATIVE 08/16/2019 2149   HGBUR NEGATIVE 08/16/2019 2149   BILIRUBINUR NEGATIVE 08/16/2019 2149   BILIRUBINUR ++ 11/18/2012 1319   KETONESUR NEGATIVE 08/16/2019 2149   PROTEINUR NEGATIVE 08/16/2019 2149   UROBILINOGEN 0.2 08/04/2014  1620   NITRITE NEGATIVE 08/16/2019 2149   LEUKOCYTESUR SMALL (A) 08/16/2019 2149   Sepsis Labs: @LABRCNTIP (procalcitonin:4,lacticidven:4)  ) Recent Results (from the past 240 hour(s))  Resp panel by RT-PCR (RSV, Flu A&B, Covid) Anterior Nasal Swab     Status: None   Collection Time: 01/16/22  6:34 PM   Specimen: Anterior Nasal Swab  Result Value Ref Range Status   SARS Coronavirus 2 by RT PCR NEGATIVE NEGATIVE Final    Comment: (NOTE) SARS-CoV-2 target nucleic acids are NOT DETECTED.  The SARS-CoV-2 RNA is generally detectable in upper respiratory specimens during the acute phase of infection. The lowest concentration of SARS-CoV-2 viral copies this assay can detect is 138 copies/mL. A negative result does not preclude SARS-Cov-2 infection and should not be used as the sole basis for treatment or other patient management decisions. A negative result may occur with  improper specimen  collection/handling, submission of specimen other than nasopharyngeal swab, presence of viral mutation(s) within the areas targeted by this assay, and inadequate number of viral copies(<138 copies/mL). A negative result must be combined with clinical observations, patient history, and epidemiological information. The expected result is Negative.  Fact Sheet for Patients:  BloggerCourse.com  Fact Sheet for Healthcare Providers:  SeriousBroker.it  This test is no t yet approved or cleared by the [place] FDA and  has been authorized for detection and/or diagnosis of SARS-CoV-2 by FDA under an Emergency Use Authorization (EUA). This EUA will remain  in effect (meaning this test can be used) for the duration of the COVID-19 declaration under Section 564(b)(1) of the Act, 21 U.S.C.section 360bbb-3(b)(1), unless the authorization is terminated  or revoked sooner.       Influenza A by PCR NEGATIVE NEGATIVE Final   Influenza B by PCR NEGATIVE NEGATIVE Final    Comment: (NOTE) The Xpert Xpress SARS-CoV-2/FLU/RSV plus assay is intended as an aid in the diagnosis of influenza from Nasopharyngeal swab specimens and should not be used as a sole basis for treatment. Nasal washings and aspirates are unacceptable for Xpert Xpress SARS-CoV-2/FLU/RSV testing.  Fact Sheet for Patients: BloggerCourse.com  Fact Sheet for Healthcare Providers: SeriousBroker.it  This test is not yet approved or cleared by the [place] FDA and has been authorized for detection and/or diagnosis of SARS-CoV-2 by FDA under an Emergency Use Authorization (EUA). This EUA will remain in effect (meaning this test can be used) for the duration of the COVID-19 declaration under Section 564(b)(1) of the Act, 21 U.S.C. section 360bbb-3(b)(1), unless the authorization is terminated or revoked.     Resp Syncytial  Virus by PCR NEGATIVE NEGATIVE Final    Comment: (NOTE) Fact Sheet for Patients: BloggerCourse.com  Fact Sheet for Healthcare Providers: SeriousBroker.it  This test is not yet approved or cleared by the [place] FDA and has been authorized for detection and/or diagnosis of SARS-CoV-2 by FDA under an Emergency Use Authorization (EUA). This EUA will remain in effect (meaning this test can be used) for the duration of the COVID-19 declaration under Section 564(b)(1) of the Act, 21 U.S.C. section 360bbb-3(b)(1), unless the authorization is terminated or revoked.  Performed at [facility], [address] [place], [place] [address]   Culture, blood (Routine X 2) w Reflex to ID Panel     Status: None   Collection Time: 01/16/22  8:00 PM   Specimen: BLOOD  Result Value Ref Range Status   Specimen Description BLOOD LEFT ANTECUBITAL  Final   Special Requests   Final  BOTTLES DRAWN AEROBIC AND ANAEROBIC Blood Culture adequate volume   Culture   Final    NO GROWTH 5 DAYS Performed at [facility], [address] [place], [place] [address]    Report Status 01/22/2022 FINAL  Final  Blood culture (routine x 2)     Status: None   Collection Time: 01/16/22  8:14 PM   Specimen: BLOOD LEFT FOREARM  Result Value Ref Range Status   Specimen Description BLOOD LEFT FOREARM  Final   Special Requests   Final    BOTTLES DRAWN AEROBIC AND ANAEROBIC Blood Culture adequate volume   Culture   Final    NO GROWTH 5 DAYS Performed at [facility], [address] [place] [address] [place], [place] [address]    Report Status 01/21/2022 FINAL  Final  MRSA Next Gen by PCR, Nasal     Status: None   Collection Time: 01/17/22  3:00 AM  Result Value Ref Range Status   MRSA by PCR Next Gen NOT DETECTED NOT DETECTED Final    Comment: (NOTE) The GeneXpert MRSA Assay (FDA approved for NASAL specimens only), is one component of a comprehensive MRSA  colonization surveillance program. It is not intended to diagnose MRSA infection nor to guide or monitor treatment for MRSA infections. Test performance is not FDA approved in patients less than 30 years old. Performed at [facility], [address], [place], [place] [address]   Culture, Respiratory w Gram Stain     Status: None (Preliminary result)   Collection Time: 01/23/22  6:20 PM   Specimen: Tracheal Aspirate; Respiratory  Result Value Ref Range Status   Specimen Description TRACHEAL ASPIRATE  Final   Special Requests NONE  Final   Gram Stain   Final    RARE WBC PRESENT, PREDOMINANTLY PMN FEW GRAM POSITIVE COCCI IN CLUSTERS FEW GRAM POSITIVE COCCI IN CHAINS FEW GRAM NEGATIVE RODS FEW GRAM NEGATIVE DIPLOCOCCI    Culture   Final    TOO YOUNG TO READ Performed at [facility], [address] [place], [place] [address]    Report Status PENDING  Incomplete     Radiology Studies: DG CHEST PORT 1 VIEW  Result Date: 01/23/2022 CLINICAL DATA:  Somnolence, short of breath, confusion EXAM: PORTABLE CHEST 1 VIEW COMPARISON:  01/22/2022 FINDINGS: Single frontal view of the chest demonstrates stable enlargement of the cardiac silhouette. Central vascular congestion again noted, with patchy areas of consolidation at the lung bases unchanged. Stable emphysema. No effusion or pneumothorax. No acute bony abnormalities. IMPRESSION: 1. Stable bibasilar consolidation which may reflect atelectasis or pneumonia. 2. Stable emphysema. Electronically Signed   By: Sharlet Salina M.D.   On: 01/23/2022 11:29     Scheduled Meds:  amLODipine  10 mg Oral Daily   aspirin  81 mg Oral Daily   budesonide (PULMICORT) nebulizer solution  0.25 mg Nebulization BID   carvedilol  12.5 mg Oral BID WC   Chlorhexidine Gluconate Cloth  6 each Topical Daily   diclofenac Sodium  2 g Topical QID   DULoxetine  60 mg Oral Daily   furosemide  80 mg Oral Daily   heparin  5,000 Units Subcutaneous Q8H    hydrALAZINE  25 mg Oral Q8H   ipratropium-albuterol  3 mL Nebulization BID   isosorbide mononitrate  90 mg Oral Daily   pravastatin  40 mg Oral Daily   spironolactone  12.5 mg Oral Daily   Continuous Infusions:  cefTRIAXone (ROCEPHIN)  IV  Stopped (01/24/22 1525)     LOS: 7 days    Time spent:    Zannie Cove, MD [facility]   01/24/2022, 7:18 PM

## 2022-01-24 NOTE — Progress Notes (Signed)
Occupational Therapy Treatment Patient Details Name: [name] MRN: [id number] DOB: [birth year] Today's Date: 01/24/2022   History of present illness Pt is an 81 y/o male who presents 01/16/22 with SOB, cough, choking on food at home. He was found to have PNA and then respiratory failure. Pt underwent MBS and EGD during admission as well. PMH significant for Ankylosing spondylitis, CAD, CHF, HTN, COPD.   OT comments  [name] is making great progress, pt seen with PT for safety. Overall he required mod A for bed mobility mod A +2 to stand and min A +2 to take pivotal steps. Pt is reliant on BUE support in standing, therefore required max A for rear peri hygiene in standing. OT to continue to follow acutely. POC remains appropriate.    Recommendations for follow up therapy are one component of a multi-disciplinary discharge planning process, led by the attending physician.  Recommendations may be updated based on patient status, additional functional criteria and insurance authorization.    Follow Up Recommendations  Skilled nursing-short term rehab (<3 hours/day)     Assistance Recommended at Discharge Frequent or constant Supervision/Assistance  Patient can return home with the following  A lot of help with walking and/or transfers;A lot of help with bathing/dressing/bathroom   Equipment Recommendations  None recommended by OT       Precautions / Restrictions Precautions Precautions: Fall Precaution Comments: monitor O2 Restrictions Weight Bearing Restrictions: No       Mobility Bed Mobility Overal bed mobility: Needs Assistance Bed Mobility: Supine to Sit     Supine to sit: Mod assist     General bed mobility comments: pt able to manage LE off bed, pt pulls up against therapist to come to EoB and bring trunk to upright    Transfers Overall transfer level: Needs assistance Equipment used: Rolling walker (2 wheels) Transfers: Sit to/from Stand Sit to Stand: Mod  assist, +2 physical assistance     Step pivot transfers: Min assist, +2 safety/equipment     General transfer comment: modAx2 for power up and steadying at RW, min A for steadying with stepping from bed to chair     Balance Overall balance assessment: Needs assistance Sitting-balance support: Feet supported, No upper extremity supported Sitting balance-Leahy Scale: Fair     Standing balance support: During functional activity Standing balance-Leahy Scale: Poor Standing balance comment: Reliant on UE support, found to be incontinent of stool on standing and able to maintain static standing with min outside support for >2 min                           ADL either performed or assessed with clinical judgement   ADL Overall ADL's : Needs assistance/impaired                         Toilet Transfer: Moderate assistance;+2 for physical assistance;+2 for safety/equipment;Stand-pivot;Rolling walker (2 wheels) Toilet Transfer Details (indicate cue type and reason): simulated. Toileting- Clothing Manipulation and Hygiene: Maximal assistance;Sit to/from stand Toileting - Clothing Manipulation Details (indicate cue type and reason): rear peri hygiene in standing with max A, pt is reliant on BUE support in standing     Functional mobility during ADLs: Moderate assistance;+2 for physical assistance;+2 for safety/equipment;Rolling walker (2 wheels) General ADL Comments: improved mobility this date. soiled BM upon arrival, pt tolerated prolonged standing for mobility and hygiene    Extremity/Trunk Assessment Upper Extremity Assessment Upper Extremity Assessment:  Generalized weakness (more difficulty with LUE ROM)   Lower Extremity Assessment Lower Extremity Assessment: Defer to PT evaluation        Vision   Vision Assessment?: No apparent visual deficits   Perception Perception Perception: Impaired   Praxis Praxis Praxis: Not tested    Cognition  Arousal/Alertness: Awake/alert Behavior During Therapy: WFL for tasks assessed/performed Overall Cognitive Status: Difficult to assess                           Safety/Judgement: Decreased awareness of safety, Decreased awareness of deficits     General Comments: continues to have some decreased awareness of his deficits        Exercises Other Exercises Other Exercises: IS x 10, max inhale 1750 mL       General Comments daughters at bedside, report he will work for therapist but as soon as HHPT/OT runs out he does not get up and move around, pt on 4L O2 via [place] and able to maintain SpO2 >90%, improved oxygenation >95% with exertion, able to reproduce with use of incentive    Pertinent Vitals/ Pain       Pain Assessment Pain Assessment: Faces Faces Pain Scale: Hurts little more Pain Location: generalized with movement Pain Descriptors / Indicators: Discomfort, Grimacing Pain Intervention(s): Limited activity within patient's tolerance, Monitored during session   Frequency  Min 2X/week        Progress Toward Goals  OT Goals(current goals can now be found in the care plan section)  Progress towards OT goals: Progressing toward goals  Acute Rehab OT Goals Patient Stated Goal: VSS, daughter present and supportive. pt had BM. OT Goal Formulation: With patient/family Time For Goal Achievement: 02/05/22 Potential to Achieve Goals: Good ADL Goals Pt Will Perform Grooming: with min guard assist;standing Pt Will Transfer to Toilet: with min guard assist;ambulating Pt Will Perform Toileting - Clothing Manipulation and hygiene: with min assist;sit to/from stand;sitting/lateral leans  Plan Discharge plan remains appropriate    Co-evaluation    PT/OT/SLP Co-Evaluation/Treatment: Yes Reason for Co-Treatment: Complexity of the patient's impairments (multi-system involvement);For patient/therapist safety;To address functional/ADL transfers   OT goals addressed during  session: ADL's and self-care      AM-PAC OT "6 Clicks" Daily Activity     Outcome Measure   Help from another person eating meals?: None Help from another person taking care of personal grooming?: A Little Help from another person toileting, which includes using toliet, bedpan, or urinal?: A Lot Help from another person bathing (including washing, rinsing, drying)?: A Lot Help from another person to put on and taking off regular upper body clothing?: A Lot Help from another person to put on and taking off regular lower body clothing?: A Lot 6 Click Score: 15    End of Session Equipment Utilized During Treatment: Gait belt;Rolling walker (2 wheels);Oxygen  OT Visit Diagnosis: Unsteadiness on feet (R26.81);Other abnormalities of gait and mobility (R26.89);Muscle weakness (generalized) (M62.81)   Activity Tolerance Patient tolerated treatment well   Patient Left in chair;with call bell/phone within reach;with chair alarm set;with family/visitor present;with nursing/sitter in room   Nurse Communication Mobility status        Time: 1314-1340 OT Time Calculation (min): 26 min  Charges: OT General Charges $OT Visit: 1 Visit OT Treatments $Self Care/Home Management : 8-22 mins    Elliot Cousin 01/24/2022, 3:12 PM

## 2022-01-24 NOTE — Progress Notes (Addendum)
Rounding Note    Patient Name: [name] Date of Encounter: 01/25/2022  [place] Cardiologist: Quay Burow, MD   Subjective   Feeling much better. Planned to go to rehab today.  2.1L UOP in 24h Wt 307>306lbs   Inpatient Medications    Scheduled Meds:  amLODipine  10 mg Oral Daily   aspirin  81 mg Oral Daily   budesonide (PULMICORT) nebulizer solution  0.25 mg Nebulization BID   carvedilol  12.5 mg Oral BID WC   Chlorhexidine Gluconate Cloth  6 each Topical Daily   diclofenac Sodium  2 g Topical QID   DULoxetine  60 mg Oral Daily   furosemide  80 mg Oral Daily   heparin  5,000 Units Subcutaneous Q8H   hydrALAZINE  25 mg Oral Q8H   ipratropium-albuterol  3 mL Nebulization BID   isosorbide mononitrate  90 mg Oral Daily   pravastatin  40 mg Oral Daily   spironolactone  12.5 mg Oral Daily   tamsulosin  0.4 mg Oral Daily   Continuous Infusions:  cefTRIAXone (ROCEPHIN)  IV Stopped (01/24/22 1525)   PRN Meds: acetaminophen **OR** acetaminophen, albuterol, docusate sodium, hydrALAZINE, polyethylene glycol   Vital Signs    Vitals:   01/24/22 2227 01/25/22 0531 01/25/22 0734 01/25/22 0851  BP: (!) 128/51 (!) 156/58  (!) 150/50  Pulse: 63 69 67 67  Resp:  20 16 20   Temp:  99.2 F (37.3 C)  98.4 F (36.9 C)  TempSrc:  Axillary  Oral  SpO2: 96% 97% 92% 93%  Weight:  (!) 139.3 kg    Height:        Intake/Output Summary (Last 24 hours) at 01/25/2022 0917 Last data filed at 01/25/2022 0531 Gross per 24 hour  Intake 100 ml  Output 1700 ml  Net -1600 ml      01/25/2022    5:31 AM 01/24/2022    6:33 AM 01/22/2022    5:00 AM  Last 3 Weights  Weight (lbs) 307 lb 303 lb 302 lb 7.5 oz  Weight (kg) 139.254 kg 137.44 kg 137.2 kg      Telemetry    NSR, occasional PVC - Personally Reviewed  ECG    No new tracing today - Personally Reviewed  Physical Exam   GEN: Sitting up, comfortable Neck: No JVD Cardiac: RRR, no murmurs, rubs,  or gallops.  Respiratory: Diminished but clear GI: Obese, soft MS: No edema, warm Neuro:  Nonfocal  Psych: Normal affect   Labs    High Sensitivity Troponin:   Recent Labs  Lab 01/16/22 1831 01/16/22 2015 01/17/22 0231  TROPONINIHS 402* 540* 451*     Chemistry Recent Labs  Lab 01/21/22 0243 01/22/22 0429 01/23/22 0400 01/24/22 0151  NA 137 136 135 137  K 3.6 3.4* 3.8 4.2  CL 100 98 101 101  CO2 28 30 26 28   GLUCOSE 139* 138* 136* 142*  BUN 30* 30* 25* 34*  CREATININE 1.42* 1.33* 1.21 1.40*  CALCIUM 9.8 10.3 9.9 10.4*  MG 2.2 2.3  --   --   PROT  --  6.6 6.3* 6.6  ALBUMIN  --  2.4* 2.2* 2.3*  AST  --  32 33 40  ALT  --  27 28 31   ALKPHOS  --  90 82 84  BILITOT  --  0.6 0.4 0.7  GFRNONAA 49* 53* 59* 50*  ANIONGAP 9 8 8 8     Lipids No results for input(s): "CHOL", "TRIG", "HDL", "  LABVLDL", "LDLCALC", "CHOLHDL" in the last 168 hours.  Hematology Recent Labs  Lab 01/22/22 0429 01/23/22 0400 01/24/22 0151  WBC 12.0* 12.7* 12.2*  RBC 4.52 4.12* 3.99*  HGB 13.8 12.4* 12.2*  HCT 41.9 38.7* 37.5*  MCV 92.7 93.9 94.0  MCH 30.5 30.1 30.6  MCHC 32.9 32.0 32.5  RDW 13.4 13.4 13.4  PLT 243 275 260   Thyroid No results for input(s): "TSH", "FREET4" in the last 168 hours.  BNP No results for input(s): "BNP", "PROBNP" in the last 168 hours.  DDimer No results for input(s): "DDIMER" in the last 168 hours.   Radiology    DG CHEST PORT 1 VIEW  Result Date: 01/23/2022 CLINICAL DATA:  Somnolence, short of breath, confusion EXAM: PORTABLE CHEST 1 VIEW COMPARISON:  01/22/2022 FINDINGS: Single frontal view of the chest demonstrates stable enlargement of the cardiac silhouette. Central vascular congestion again noted, with patchy areas of consolidation at the lung bases unchanged. Stable emphysema. No effusion or pneumothorax. No acute bony abnormalities. IMPRESSION: 1. Stable bibasilar consolidation which may reflect atelectasis or pneumonia. 2. Stable emphysema.  Electronically Signed   By: Randa Ngo M.D.   On: 01/23/2022 11:29    Cardiac Studies   TTE 01/17/22: IMPRESSIONS     1. Left ventricular ejection fraction, by estimation, is 55 to 60%. The  left ventricle has normal function. The left ventricle has no regional  wall motion abnormalities. There is moderate concentric left ventricular  hypertrophy. Indeterminate diastolic  filling due to E-A fusion.   2. Right ventricular systolic function was not well visualized. The right  ventricular size is not well visualized.   3. The mitral valve was not well visualized. No evidence of mitral valve  regurgitation.   4. The aortic valve was not well visualized. Aortic valve regurgitation  is not visualized. No aortic stenosis is present.   5. Aortic dilatation noted. There is mild dilatation of the aortic root,  measuring 41 mm.   6. The inferior vena cava is normal in size with greater than 50%  respiratory variability, suggesting right atrial pressure of 3 mmHg.   Comparison(s): No significant change from prior study.   Patient Profile     81 y.o. male hx of CAD, COPD on 4LNC, OSA, Dementia and HFpEF who presented with worsening SOB, fever and LE edema found to have multifocal pneumonia and acute diastolic HF. Cardiology is consulted regarding management of acute diastolic HF.  Assessment & Plan    #Acute on chronic diastolic heart failure:  Patient presented with respiratory distress found to have multifocal pneumonia and acute on chronic diastolic HF exacerbation. TTE with EF 55-60%, moderate LVH, indeterminate diastolic function. Responded well to lasix IV BID now transitioned to PO. -Continue lasix 58m PO daily -Continue spironolactone 12.514mdaily -Will need BMET 1 week after discharge  -Trend I/Os and daily weights -BP control as below  #Acute on chronic respiratory failure with hypoxia:  Likely multifactorial due to CAP, acute on chronic diastolic heart failure,  COPD. -Continue lasix 4059mO daily -Abx per primary team  #Hypertensive urgency:  Initially required Nitro gtt, now with improvement in pressures and transitioned to PO. Blood pressure elevated at 130-150s. Will adjust medications as below. -Continue amlodipine 68m41mily -Continue coreg 12.5mg 58m -Continue imdur 90mg 31my -Continue spironolactone 12.5mg da74m -Increase hydralazine to 100mg TI40mElevated troponin:  Likely demand ischemia in setting of acute illness.  Peaked 540. Will continue with medical management as above. -Continue ASA 81mg41m  daily -Continue pravastatin 25m daily  #Esophageal stricture:  Noted on esophagram, underwent an EGD 12/17 which showed lower esophageal ring which was dilated  #AKI:  -Monitor with diuresis  #HLD:  -On pravastatin 492mdaily  Patient being discharged today. Will arrange for CV follow-up. Needs BMET in 1 week to monitor renal function.  For questions or updates, please contact [place] consult [email or web address] for contact info under        Signed, HeFreada BergeronMD  01/25/2022, 9:17 AM

## 2022-01-25 DIAGNOSIS — I2510 Atherosclerotic heart disease of native coronary artery without angina pectoris: Secondary | ICD-10-CM | POA: Diagnosis not present

## 2022-01-25 DIAGNOSIS — J9621 Acute and chronic respiratory failure with hypoxia: Secondary | ICD-10-CM | POA: Diagnosis not present

## 2022-01-25 DIAGNOSIS — I5033 Acute on chronic diastolic (congestive) heart failure: Secondary | ICD-10-CM | POA: Diagnosis not present

## 2022-01-25 DIAGNOSIS — J449 Chronic obstructive pulmonary disease, unspecified: Secondary | ICD-10-CM | POA: Diagnosis not present

## 2022-01-25 DIAGNOSIS — I10 Essential (primary) hypertension: Secondary | ICD-10-CM

## 2022-01-25 LAB — BASIC METABOLIC PANEL
Anion gap: 9 (ref 5–15)
BUN: 39 mg/dL — ABNORMAL HIGH (ref 8–23)
CO2: 28 mmol/L (ref 22–32)
Calcium: 11 mg/dL — ABNORMAL HIGH (ref 8.9–10.3)
Chloride: 98 mmol/L (ref 98–111)
Creatinine, Ser: 1.48 mg/dL — ABNORMAL HIGH (ref 0.61–1.24)
GFR, Estimated: 47 mL/min — ABNORMAL LOW (ref >60)
Glucose, Bld: 133 mg/dL — ABNORMAL HIGH (ref 70–99)
Potassium: 4.7 mmol/L (ref 3.5–5.1)
Sodium: 135 mmol/L (ref 135–145)

## 2022-01-25 MED ORDER — FUROSEMIDE 40 MG PO TABS
40.0000 mg | ORAL_TABLET | Freq: Every day | ORAL | Status: DC
  Administered 2022-01-26: 40 mg via ORAL
  Filled 2022-01-25: qty 1

## 2022-01-25 MED ORDER — HYDRALAZINE HCL 50 MG PO TABS
50.0000 mg | ORAL_TABLET | Freq: Three times a day (TID) | ORAL | Status: DC
  Administered 2022-01-25 – 2022-01-26 (×3): 50 mg via ORAL
  Filled 2022-01-25 (×3): qty 1

## 2022-01-25 MED ORDER — FUROSEMIDE 40 MG PO TABS: 60.0000 mg | ORAL_TABLET | Freq: Every day | ORAL | Status: DC

## 2022-01-25 MED ORDER — TAMSULOSIN HCL 0.4 MG PO CAPS
0.4000 mg | ORAL_CAPSULE | Freq: Every day | ORAL | Status: DC
  Administered 2022-01-25 – 2022-01-26 (×2): 0.4 mg via ORAL
  Filled 2022-01-25 (×2): qty 1

## 2022-01-25 MED ORDER — FUROSEMIDE 40 MG PO TABS: 80.0000 mg | ORAL_TABLET | Freq: Every day | ORAL | Status: DC

## 2022-01-25 NOTE — Progress Notes (Signed)
Speech Language Pathology Treatment: Dysphagia  Patient Details Name: [name] MRN: [id number] DOB: 1940-12-04 Today's Date: 01/25/2022 Time: 9211-9417 SLP Time Calculation (min) (ACUTE ONLY): 41 min  Assessment / Plan / Recommendation Clinical Impression  Pt seen for ongoing dysphagia management.  Daughter [name] present and states pt will not and has not been drinking nectar thick liquids.  Pt also enjoys soups. She has modified soup consistency with addition of crackers.  Reviewed results of MBS with pt and daughter. As diet modifications are not desired pt will resume thin liquids as safely as possible.  Pt was unable to use strategy for small sips even with maximal cuing.  With single cup sips, pt still takes a very large amount at once with multiple swallows and wet vocal quality noted.  Pt was given a 10cc Provale cup to trial.  Perceptually vocal quality improved. Pt expressed frustration with amount he was able to get from cup.  This appears to be an effective tool to limit sip size. Daughter is in agreement.  We will advance to thin liquids with use of Provale cup.  Daughter states pt will d/c to [facility] place tomorrow.  Pt will need continued ST at next level of care for training with swallow strategies vs swallowing rehab to improve swallow strength.  Recommend continuing regular texture diet (GI soft) with thin liquid by 10cc provale cup.   HPI HPI: [name] is a 81 y.o. male who presented to [facility] ED 12/12 with dyspnea. He had experienced intermittent non bilious emesis earlier in the week and on 12/11, he developed fever with Tmax of 101.8.  He apparently has had a steady increase in his weight over the past few weeks, unsure exactly how much.  CXR 12/14: "Bibasilar airspace disease, not significantly changed from prior,  compatible with pneumonia."  Pt seen by outpatient SLP 12/4 with recommendations for MBSS (OP MBS scheduled for 12/20).  Esophagram 12/14 with no passage in  distal esophagus.  Pt now s/p EGD with dilation of Schatzki Ring on 12/16.  Pt has a PMH including but not limited to COPD on 4L O2, OSA on CPAP, CHF.      SLP Plan  Continue with current plan of care      Recommendations for follow up therapy are one component of a multi-disciplinary discharge planning process, led by the attending physician.  Recommendations may be updated based on patient status, additional functional criteria and insurance authorization.    Recommendations  Diet recommendations: Regular;Thin liquid Liquids provided via:  (10cc Provale cup) Supervision: Full supervision/cueing for compensatory strategies Compensations: Slow rate;Small sips/bites Postural Changes and/or Swallow Maneuvers: Seated upright 90 degrees;Upright 30-60 min after meal                Oral Care Recommendations: Oral care BID Follow Up Recommendations: Skilled nursing-short term rehab (<3 hours/day) Assistance recommended at discharge: Frequent or constant Supervision/Assistance SLP Visit Diagnosis: Dysphagia, oropharyngeal phase (R13.12);Dysphagia, pharyngoesophageal phase (R13.14) Plan: Continue with current plan of care           Celedonio Savage, [place], [place] Office: [phone] 01/25/2022, 11:46 AM
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
## 2022-01-25 NOTE — Progress Notes (Signed)
Foley catheter d/ced per order without problems.  Patient states he cannot control his urination.  Primofit placed for accurate output measurement.

## 2022-01-26 DIAGNOSIS — I5032 Chronic diastolic (congestive) heart failure: Secondary | ICD-10-CM | POA: Diagnosis not present

## 2022-01-26 DIAGNOSIS — I169 Hypertensive crisis, unspecified: Secondary | ICD-10-CM | POA: Diagnosis not present

## 2022-01-26 DIAGNOSIS — M6281 Muscle weakness (generalized): Secondary | ICD-10-CM | POA: Diagnosis not present

## 2022-01-26 DIAGNOSIS — J9621 Acute and chronic respiratory failure with hypoxia: Secondary | ICD-10-CM | POA: Diagnosis not present

## 2022-01-26 DIAGNOSIS — R600 Localized edema: Secondary | ICD-10-CM | POA: Diagnosis not present

## 2022-01-26 DIAGNOSIS — K5190 Ulcerative colitis, unspecified, without complications: Secondary | ICD-10-CM | POA: Diagnosis not present

## 2022-01-26 DIAGNOSIS — R278 Other lack of coordination: Secondary | ICD-10-CM | POA: Diagnosis not present

## 2022-01-26 DIAGNOSIS — I2510 Atherosclerotic heart disease of native coronary artery without angina pectoris: Secondary | ICD-10-CM | POA: Diagnosis not present

## 2022-01-26 DIAGNOSIS — Z743 Need for continuous supervision: Secondary | ICD-10-CM | POA: Diagnosis not present

## 2022-01-26 DIAGNOSIS — J439 Emphysema, unspecified: Secondary | ICD-10-CM | POA: Diagnosis not present

## 2022-01-26 DIAGNOSIS — N1831 Chronic kidney disease, stage 3a: Secondary | ICD-10-CM

## 2022-01-26 DIAGNOSIS — G4733 Obstructive sleep apnea (adult) (pediatric): Secondary | ICD-10-CM | POA: Diagnosis not present

## 2022-01-26 DIAGNOSIS — N390 Urinary tract infection, site not specified: Secondary | ICD-10-CM | POA: Diagnosis not present

## 2022-01-26 DIAGNOSIS — I5033 Acute on chronic diastolic (congestive) heart failure: Secondary | ICD-10-CM | POA: Diagnosis not present

## 2022-01-26 DIAGNOSIS — Z7401 Bed confinement status: Secondary | ICD-10-CM | POA: Diagnosis not present

## 2022-01-26 DIAGNOSIS — J449 Chronic obstructive pulmonary disease, unspecified: Secondary | ICD-10-CM | POA: Diagnosis not present

## 2022-01-26 DIAGNOSIS — I5030 Unspecified diastolic (congestive) heart failure: Secondary | ICD-10-CM | POA: Diagnosis not present

## 2022-01-26 DIAGNOSIS — Z9861 Coronary angioplasty status: Secondary | ICD-10-CM | POA: Diagnosis not present

## 2022-01-26 DIAGNOSIS — J9611 Chronic respiratory failure with hypoxia: Secondary | ICD-10-CM | POA: Diagnosis not present

## 2022-01-26 DIAGNOSIS — E785 Hyperlipidemia, unspecified: Secondary | ICD-10-CM | POA: Diagnosis not present

## 2022-01-26 DIAGNOSIS — R1312 Dysphagia, oropharyngeal phase: Secondary | ICD-10-CM | POA: Diagnosis not present

## 2022-01-26 DIAGNOSIS — R7301 Impaired fasting glucose: Secondary | ICD-10-CM | POA: Diagnosis not present

## 2022-01-26 DIAGNOSIS — R4702 Dysphasia: Secondary | ICD-10-CM | POA: Diagnosis not present

## 2022-01-26 DIAGNOSIS — M450 Ankylosing spondylitis of multiple sites in spine: Secondary | ICD-10-CM | POA: Diagnosis not present

## 2022-01-26 DIAGNOSIS — J9601 Acute respiratory failure with hypoxia: Secondary | ICD-10-CM | POA: Diagnosis not present

## 2022-01-26 DIAGNOSIS — L988 Other specified disorders of the skin and subcutaneous tissue: Secondary | ICD-10-CM | POA: Diagnosis not present

## 2022-01-26 DIAGNOSIS — D72829 Elevated white blood cell count, unspecified: Secondary | ICD-10-CM | POA: Diagnosis not present

## 2022-01-26 DIAGNOSIS — E876 Hypokalemia: Secondary | ICD-10-CM | POA: Diagnosis not present

## 2022-01-26 DIAGNOSIS — R2689 Other abnormalities of gait and mobility: Secondary | ICD-10-CM | POA: Diagnosis not present

## 2022-01-26 DIAGNOSIS — I10 Essential (primary) hypertension: Secondary | ICD-10-CM | POA: Diagnosis not present

## 2022-01-26 DIAGNOSIS — J9622 Acute and chronic respiratory failure with hypercapnia: Secondary | ICD-10-CM | POA: Diagnosis not present

## 2022-01-26 DIAGNOSIS — J441 Chronic obstructive pulmonary disease with (acute) exacerbation: Secondary | ICD-10-CM | POA: Diagnosis not present

## 2022-01-26 LAB — CULTURE, RESPIRATORY W GRAM STAIN: Culture: NORMAL

## 2022-01-26 MED ORDER — HYDRALAZINE HCL 100 MG PO TABS: 100.0000 mg | ORAL_TABLET | Freq: Three times a day (TID) | ORAL | Status: DC

## 2022-01-26 MED ORDER — HYDRALAZINE HCL 50 MG PO TABS: 100.0000 mg | ORAL_TABLET | Freq: Three times a day (TID) | ORAL | Status: DC

## 2022-01-26 MED ORDER — FUROSEMIDE 40 MG PO TABS: 40.0000 mg | ORAL_TABLET | Freq: Every morning | ORAL | Status: DC

## 2022-01-26 MED ORDER — POLYETHYLENE GLYCOL 3350 17 G PO PACK: 17.0000 g | PACK | Freq: Every day | ORAL | 0 refills | Status: DC | PRN

## 2022-01-26 MED ORDER — CARVEDILOL 12.5 MG PO TABS: 12.5000 mg | ORAL_TABLET | Freq: Two times a day (BID) | ORAL | Status: DC

## 2022-01-26 MED ORDER — SPIRONOLACTONE 25 MG PO TABS: 12.5000 mg | ORAL_TABLET | Freq: Every day | ORAL | Status: DC

## 2022-01-26 MED ORDER — ISOSORBIDE MONONITRATE ER 30 MG PO TB24: 90.0000 mg | ORAL_TABLET | Freq: Every day | ORAL | Status: DC

## 2022-01-26 MED ORDER — TAMSULOSIN HCL 0.4 MG PO CAPS: 0.4000 mg | ORAL_CAPSULE | Freq: Every day | ORAL | Status: DC

## 2022-01-26 MED ORDER — AMLODIPINE BESYLATE 10 MG PO TABS: 10.0000 mg | ORAL_TABLET | Freq: Every day | ORAL | Status: DC

## 2022-01-26 NOTE — Progress Notes (Signed)
Patient continues to refuse lab draws. Patient  states, "I am sick and tired of being stuck'. [place] MD made aware

## 2022-01-26 NOTE — Progress Notes (Signed)
Mobility Specialist - Progress Note   01/26/22 0957  Mobility  Activity Transferred to/from BSC  Level of Assistance +2 (takes two people)  Assistive Device Front wheel walker  Activity Response Tolerated well  Mobility Referral Yes  $Mobility charge 1 Mobility   Pt received in bed and requesting assistance to BSC. Pt was MinA to EOB and ModA +2 to stand and transfer to BSC. Pt c/o groin pain while getting to the EOB. Pt was left on BSC with all needs met and instructed to use call bell when finished.      Mobility Specialist Please contact via SecureChat or Rehab office at [phone]  

## 2022-01-26 NOTE — TOC Transition Note (Signed)
Transition of Care [facility]) - CM/SW Discharge Note   Patient Details  Name: [name] MRN: [id number] Date of Birth: October 26, 1940  Transition of Care [facility]) CM/SW Contact:  Amador Cunas, [place] Phone Number: 01/26/2022, 11:48 AM   Clinical Narrative:   Pt for dc to [facility]. Spoke to [place] at [facility] who confirmed they received auth and are prepared to admit pt to room 1206. Pt's dtrs aware of dc and report agreeable. RN provided with number for report and PT[facility] arranged for transport. SW signing off at dc.   Wandra Feinstein, MSW, LCSW [phone] (coverage)      Final next level of care: Skilled Nursing Facility Barriers to Discharge: No Barriers Identified   Patient Goals and CMS Choice CMS Medicare.gov Compare Post Acute Care list provided to:: Patient Represenative (must comment) Choice offered to / list presented to : Adult Children  Discharge Placement                Patient chooses bed at: [facility] Patient to be transferred to facility by: [place] Name of family member notified: [name] Patient and family notified of of transfer: 01/26/22  Discharge Plan and Services Additional resources added to the After Visit Summary for   In-house Referral: Clinical Social Work   Post Acute Care Choice: [place]                               Social Determinants of Health (SDOH) Interventions SDOH Screenings   Food Insecurity: No Food Insecurity (01/18/2022)  Housing: Low Risk  (01/18/2022)  Transportation Needs: No Transportation Needs (01/18/2022)  Utilities: Not At Risk (01/18/2022)  Alcohol Screen: Low Risk  (01/31/2021)  Depression (PHQ2-9): High Risk (12/21/2021)  Financial Resource Strain: Low Risk  (01/31/2021)  Physical Activity: Inactive (01/31/2021)  Social Connections: Moderately Integrated (01/31/2021)  Stress: Stress Concern Present (01/31/2021)  Tobacco Use: Medium Risk (01/22/2022)     Readmission Risk  Interventions     No data to display

## 2022-01-26 NOTE — Care Management Important Message (Signed)
Important Message  Patient Details  Name: MACKENZIE LIA MRN: [id number] Date of Birth: Aug 17, 1940   Medicare Important Message Given:  Yes     Shelda Altes 01/26/2022, 11:00 AM

## 2022-01-26 NOTE — Discharge Summary (Signed)
Physician Discharge Summary  [name] [id number]:[id number] DOB: 1940/10/28 DOA: 01/16/2022  PCP: [name], MD  Admit date: 01/16/2022 Discharge date: 01/26/2022  Time spent: 45 minutes  Recommendations for Outpatient Follow-up:  Cards Fu with Dr.Berry or APP in 2-3weeks Please check BMP in 1 week Outpatient speech therapy SNF for short-term rehab   Discharge Diagnoses:  Principal Problem:   Acute on chronic hypoxic respiratory failure ([place]) Active Problems:   Community acquired pneumonia of bilateral lower lobes and right middle lobe due to Streptoccocus pneumoniae   Acute on chronic diastolic heart failure ([place])   Hypertensive crisis   Esophageal dysphagia   OSA treated with BiPAP   CAD S/P percutaneous coronary angioplasty   Essential hypertension   Dyslipidemia, goal LDL below 70   Morbid obesity ([name])   COPD (chronic obstructive pulmonary disease) ([name])   Chronic respiratory failure with hypoxia ([name])   Depression, major, single episode, moderate ([name])   Stage 3a chronic kidney disease (CKD) ([place])   Myocardial injury   Hypokalemia BPH Urinary retention  Discharge Condition: Improved  Diet recommendation: Low-sodium, heart healthy  Filed Weights   01/24/22 0633 01/25/22 0531 01/26/22 0518  Weight: (!) 137.4 kg (!) 139.3 kg (!) 139 kg    History of present illness:  81 y.o. M with COPD and dCHF and morbid obesity BMI 40.4 with chronic respiratory failure on 4L home O2, HTN, OSA on BiPAP, HLD and CAD s/p PCI >75yr ulcerative colitis  presented with dyspnea, fever and lower extremity swelling. CT B/L lower lobe and RML airspace opacities.  In respiratory distress requiring BiPAP on admission to ICU with antibiotics/diuretic   Hospital Course:   Acute on chronic hypoxic respiratory failure ([name]) Multifactorial, largely driven by resolving CHF and pneumonia, possibly also COPD, obesity/OSA and atelectasis. -weaned to 4-5L, close to baseline 4L.     -Discharge planning, TOC following, plan for SNF for rehab today   Acute on chronic diastolic heart failure Cards following ECHo w/ EF 55-60%, mod LVH, indeterminate DD -diuresed well on IV lasix, 13L neg -Cards following -changed to PO lasix, hydralazine, Imdur, Aldactone and Coreg -SGLT2i avoided with urinary retention this admission -volume status difficult to assess, appears euvolemic now    Aspiration PNA  Urine S pneumo antigen positive earlier in this hospital stay, infiltrates on CXR, sputum and WBC >12K. -concern for aspiration,, w/ Esoph and oropharyngeal dysphagia - Completed 8-9 days of Abx, Dysphagia diet - Sputum cultures polymicrobial - Improved and stable   Hypertensive crisis -Improving - Continue furosemide, Coreg, spironolactone, hydralazine, Imdur, amlodipine   Esophageal dysphagia On admission, patient complained of several months dysphagia, including regurgitating undigested food.  Here, barium esophagram showed narrowing / stenosis of the distal esophagus with failure of the barium tab to pass  -GI consulted and EGD 12/16 showed schatzki ring, which was dilated successfully, no malignant appearing lesions.  EGD appeared to show some esophageal dysmotility at well.   -Evaluated by SLP who noticed a little oropharyngeal dysphagia/aspiration, initially on dysphagia diet now, now upgraded to soft diet/thin liquids - Continue speech therapy   Hypokalemia Resolved    Myocardial injury Due to CHF, pneumonia.  No ischemia.   Stage 3a chronic kidney disease (CKD) ([place] Acute kidney injury ruled out.  Baseline 1.1-1.3.  Cr here up to 1.5, improved steadily with diruesis,  -Stable 1.4   Depression, major, single episode, moderate ([name]) - Continue Cymbalta   Chronic respiratory failure with hypoxia ([name]) - Continue supplemental O2  COPD (chronic obstructive pulmonary disease) ([place]) He does not have a frank COPD exacerbation, but seems to be improving with  addition of Pulmicort and scheduled bronchodilators - Continue new Pulmicort - Continue scheduled and PRN albuterol  - Aggressive pulmonary toilet   Urinary retention -foley placed 12/12, continue flomax -Foley catheter removed yesterday morning, has been voiding without difficulty -Recommend urology follow-up   Morbid obesity ([place]) BMI 40.4   Dyslipidemia, goal LDL below 70 - Continue pravastatin   Essential hypertension See above   CAD S/P percutaneous coronary angioplasty - Continue aspirin, pravastatin, Coreg   OSA treated with BiPAP - Continue BiPAP at night   Discharge Exam: Vitals:   01/26/22 0805 01/26/22 1000  BP: (!) 138/48 132/78  Pulse: 65 76  Resp: 20 20  Temp: 98.8 F (37.1 C) 98.3 F (36.8 C)  SpO2: 92% 96%   Gen: Obese elderly male sitting up in bed, AAO x 2, no distress  HEENT: Neck obese unable to assess JVD Lungs: Improving air movement, decreased breath sounds at the bases CVS: S1S2/RRR Abd: soft, Non tender, non distended, BS present Extremities: No edema Skin: no new rashes on exposed skin   Discharge Instructions    Allergies as of 01/26/2022   No Known Allergies      Medication List     STOP taking these medications    celecoxib 100 MG capsule Commonly known as: CELEBREX   metoprolol tartrate 50 MG tablet Commonly known as: LOPRESSOR       TAKE these medications    acetaminophen 500 MG tablet Commonly known as: TYLENOL Take 2 tablets (1,000 mg total) by mouth every 6 (six) hours as needed for headache (pain).   albuterol 108 (90 Base) MCG/ACT inhaler Commonly known as: VENTOLIN HFA Inhale 2 puffs into the lungs every 6 (six) hours as needed for wheezing or shortness of breath.   amLODipine 10 MG tablet Commonly known as: NORVASC Take 1 tablet (10 mg total) by mouth daily. Start taking on: January 27, 2022   ascorbic acid 500 MG tablet Commonly known as: VITAMIN C Take 500 mg by mouth daily.   aspirin EC 81  MG tablet Take 81 mg by mouth daily. Swallow whole.   carvedilol 12.5 MG tablet Commonly known as: COREG Take 1 tablet (12.5 mg total) by mouth 2 (two) times daily with a meal.   DULoxetine 60 MG capsule Commonly known as: CYMBALTA TAKE 1 CAPSULE BY MOUTH DAILY What changed:  how much to take how to take this when to take this additional instructions   furosemide 40 MG tablet Commonly known as: LASIX Take 1 tablet (40 mg total) by mouth every morning.   hydrALAZINE 100 MG tablet Commonly known as: APRESOLINE Take 1 tablet (100 mg total) by mouth every 8 (eight) hours.   isosorbide mononitrate 30 MG 24 hr tablet Commonly known as: IMDUR Take 3 tablets (90 mg total) by mouth daily. Start taking on: January 27, 2022   ketoconazole 2 % cream Commonly known as: NIZORAL APPLY TOPICALLY TWICE DAILY. MIX WITH BARRIER CREAM SUCH AS DESITIN What changed:  how much to take how to take this when to take this reasons to take this additional instructions   mupirocin ointment 2 % Commonly known as: BACTROBAN Apply 1 Application topically 2 (two) times daily as needed (wound care/raw areas).   nitroGLYCERIN 0.4 MG SL tablet Commonly known as: NITROSTAT ONE TABLET UNDER TONGUE AS NEEDED FOR CHEST PAIN EVERY 5 MINUTES AS DIRECTED What  changed: See the new instructions.   nystatin cream Commonly known as: MYCOSTATIN Apply to the affected area(s) two times daily as needed for rash/itching (Apply 1 application topically 2 (two) times daily as needed (rash/itching).)   polyethylene glycol 17 g packet Commonly known as: MIRALAX / GLYCOLAX Take 17 g by mouth daily as needed for moderate constipation.   pravastatin 40 MG tablet Commonly known as: PRAVACHOL TAKE 1 TABLET(40 MG) BY MOUTH DAILY What changed:  how much to take how to take this when to take this additional instructions   pyridOXINE 100 MG tablet Commonly known as: VITAMIN B6 Take 100 mg by mouth daily.    spironolactone 25 MG tablet Commonly known as: ALDACTONE Take 0.5 tablets (12.5 mg total) by mouth daily. Start taking on: January 27, 2022   tamsulosin 0.4 MG Caps capsule Commonly known as: FLOMAX Take 1 capsule (0.4 mg total) by mouth daily. Start taking on: January 27, 2022   triamcinolone cream 0.1 % Commonly known as: KENALOG Apply 1 Application topically 2 (two) times daily. What changed:  when to take this reasons to take this   Vitamin D (Ergocalciferol) 1.25 MG (50000 UNIT) Caps capsule Commonly known as: DRISDOL TAKE 1 CAPSULE BY MOUTH EVERY 7 DAYS What changed: additional instructions       No Known Allergies  Contact information for after-discharge care     [facility] .   Service: Skilled Nursing Contact information: [address] [place] [phone]                      The results of significant diagnostics from this hospitalization (including imaging, microbiology, ancillary and laboratory) are listed below for reference.    Significant Diagnostic Studies: DG CHEST PORT 1 VIEW  Result Date: 01/23/2022 CLINICAL DATA:  Somnolence, short of breath, confusion EXAM: PORTABLE CHEST 1 VIEW COMPARISON:  01/22/2022 FINDINGS: Single frontal view of the chest demonstrates stable enlargement of the cardiac silhouette. Central vascular congestion again noted, with patchy areas of consolidation at the lung bases unchanged. Stable emphysema. No effusion or pneumothorax. No acute bony abnormalities. IMPRESSION: 1. Stable bibasilar consolidation which may reflect atelectasis or pneumonia. 2. Stable emphysema. Electronically Signed   By: Randa Ngo M.D.   On: 01/23/2022 11:29   DG Swallowing Func-Speech Pathology  Result Date: 01/22/2022 Table formatting from the original result was not included. Objective Swallowing Evaluation: Type of Study: MBS-Modified  Barium Swallow Study  Patient Details Name: [name] MRN: [id number] Date of Birth: Sep 13, 1940 Today's Date: 01/22/2022 Time: SLP Start Time (ACUTE ONLY): 34 -SLP Stop Time (ACUTE ONLY): 5643 SLP Time Calculation (min) (ACUTE ONLY): 23 min Past Medical History: Past Medical History: Diagnosis Date  Ankylosing spondylitis ([place]) 11/07/2018  Asthma   BPH (benign prostatic hyperplasia)   CAD (coronary artery disease) 01/1995  CHF (congestive heart failure) ([place])   Clostridium difficile colitis 04/2005  Colitis 2011  COPD (chronic obstructive pulmonary disease) ([name])   Depression   Diverticulosis   DJD (degenerative joint disease)   Gastric ulcer 04/17/10  Three 26m gastric ulcers, H.pylori serologies were negative  GERD (gastroesophageal reflux disease)   History of kidney stones   Hyperlipidemia   Hypertension   Idiopathic chronic inflammatory bowel disease 05/18/2010  left-sided UC  Kidney stone   Morbid obesity ([place] 03/12/2018  Obstructive sleep apnea   on Cpap  Reflux 02/1995  S/P endoscopy 07/24/10  retained gastric contents, benign bx Past Surgical History: Past Surgical History: Procedure Laterality Date  ANORECTAL MANOMETRY  2016  [facility]: concern for possible fissure. Noted pelvic floor dyssnyergy  BIOPSY  07/29/2017  Procedure: BIOPSY;  Surgeon: Daneil Dolin, MD;  [facility];  Service: Endoscopy;;  ascending and sigmoid colon  CARDIAC CATHETERIZATION    with stent  CARDIOVASCULAR STRESS TEST  07/21/2009  No scintigraphic evidence of inducible myocardial ischemia  CARPAL TUNNEL RELEASE Left 01/17/2017  Procedure: LEFT CARPAL TUNNEL RELEASE;  Surgeon: Carole Civil, MD;  Location: [facility];  Service: Orthopedics;  Laterality: Left;  CATARACT EXTRACTION, BILATERAL Bilateral   CERVICAL SPINE SURGERY    C4-5  COLONOSCOPY  04/2005  granularity and friability erosions from rectum to 40cm. Bx infection vs IBD. C. Diff positive at the time.   COLONOSCOPY  05/2010  [facility]: left-sided UC, bx with no  dysplasia, shallow diverticula  COLONOSCOPY [name] 11/07/2012  [id number] proctocolitis status post segmental biopsy/Sigmoid colon polyps removed as described above. Procedure compromisd by technical difficulties. bx: Inflammation limited to sigmoid and rectum on pathology.  COLONOSCOPY  04/2014  Dr[facility] at [facility]: well localized proctocolitis limited to sigmoid  COLONOSCOPY WITH PROPOFOL [name] 07/29/2017  diverticulosis in colon, three 4-6 mm polyps at splenic flexure and in cecum, one 10 mm polyp in rectum, abnormal rectum and sigmoid consistent with active UC  CORONARY STENT PLACEMENT  01/1995  ESOPHAGEAL DILATION  01/20/2022  Procedure: ESOPHAGEAL DILATION;  Surgeon: Otis Brace, MD;  Location: [facility];  Service: Gastroenterology;;  ESOPHAGOGASTRODUODENOSCOPY  07/24/2010  VQQ:VZDGLO esophagus  ESOPHAGOGASTRODUODENOSCOPY  04/2014  Dr. Nyoka Cowden at [facility]: negative small bowel biopsies  ESOPHAGOGASTRODUODENOSCOPY (EGD) WITH PROPOFOL [name] 01/20/2022  Procedure: ESOPHAGOGASTRODUODENOSCOPY (EGD) WITH PROPOFOL;  Surgeon: Otis Brace, MD;  Location: [facility];  Service: Gastroenterology;  Laterality: [name];  FOOT SURGERY Bilateral two  KNEE SURGERY  two  POLYPECTOMY  07/29/2017  Procedure: POLYPECTOMY;  Surgeon: Daneil Dolin, MD;  [facility];  Service: Endoscopy;;  splenic flexure, ascending colon polyp;rectal  SHOULDER SURGERY  two  TONSILLECTOMY    TRANSTHORACIC ECHOCARDIOGRAM  03/23/2009  EF 60-65%, normal LV systolic function  ULNAR HEAD EXCISION Left 01/17/2017  Procedure: LEFT ULNAR HEAD RESECTION;  Surgeon: Carole Civil, MD;  Location: [facility];  Service: Orthopedics;  Laterality: Left; HPI: NAOL ONTIVEROS is a 81 y.o. male who presented to [facility] ED 12/12 with dyspnea. He had experienced intermittent non bilious emesis earlier in the week and on 12/11, he developed fever with Tmax of 101.8.  He apparently has had a steady increase in his weight over the past few weeks, unsure  exactly how much.  CXR 12/14: "Bibasilar airspace disease, not significantly changed from prior,  compatible with pneumonia."  Pt seen by outpatient SLP 12/4 with recommendations for MBSS (OP MBS scheduled for 12/20).  Esophagram 12/14 with no passage in distal esophagus.  Pt now s/p EGD with dilation of Schatzki Ring on 12/16.  Pt has a PMH including but not limited to COPD on 4L O2, OSA on CPAP, CHF.  Subjective: pt alert and cooperative, says he usually lies completely flat while drinking  Recommendations for follow up therapy are one component of a multi-disciplinary discharge planning process, led by the attending physician.  Recommendations may be updated based on patient status, additional functional criteria and insurance authorization. Assessment / Plan / Recommendation   01/22/2022   2:00 PM Clinical Impressions Clinical Impression Pt has a mild oropharyngeal dysphagia that  is multifactorial in nature given chronic structural issues in the setting of more acute decline in respiratory status and overall conditioning. Note that images were somewhat limited by shadows from pt shoulder, and he said he could not move his neck or shoulders throughout the study to either help with visibility or attempt postural strategies. Pt has mildly reduced bolus cohesion with thin liquids, with thin liquids spilling into the pyriform sinuses prior to the swallow. He has reduced anterior hyoid excursion and base of tongue retraction, contributing to intermittently incomplete epiglottic inversion. Pt also has prior cervical hardware that appears to impact full epiglottic deflection. With smaller cup sips of thin liquids and with solid boluses he can still adequately protect his airway (PAS 2-3 with thin liquids). When he drinks via straw he has an episode of silent aspiration (PAS 8) as well as additional deeper penetration that does not clear (still PAS 3). Note that coughing was noted intermittently throughout testing and is  not necessarily correlated with aspiration. This happens as a result of aforementioned deficits in the setting of larger boluses that make it more challenging for pt to compensate and coordinate for consistent airway protection. Pt has improved airway protection (PAS 2) with nectar thick liquids regardless of delivery method and rate. Discussed options to try to reduce aspiration risk with pt, which include continuing on thin liquids via small cup sips with careful use of precautions, or alternatively trying nectar thick liquids at least temporarily in the setting of acute decompensation. Pt had a hard time answering the question, as he kept asking for buttermilk. Given some of the challenges he had during this study wtih adhering to recommended strategies, would favor trialing nectar thick liquids with his Dys 3 diet, with ongoing SLP f/u to try to facilitate return to thin liquids. SLP Visit Diagnosis Dysphagia, oropharyngeal phase (R13.12) Impact on safety and function Mild aspiration risk;Moderate aspiration risk     01/22/2022   2:00 PM Treatment Recommendations Treatment Recommendations Therapy as outlined in treatment plan below     01/22/2022   2:00 PM Prognosis Prognosis for Safe Diet Advancement Good   01/22/2022   2:00 PM Diet Recommendations SLP Diet Recommendations Dysphagia 3 (Mech soft) solids;Nectar thick liquid Liquid Administration via Cup;Straw Medication Administration Whole meds with puree Compensations Slow rate;Small sips/bites Postural Changes Seated upright at 90 degrees;Remain semi-upright after after feeds/meals (Comment)     01/22/2022   2:00 PM Other Recommendations Oral Care Recommendations Oral care BID Follow Up Recommendations Skilled nursing-short term rehab (<3 hours/day) Functional Status Assessment Patient has had a recent decline in their functional status and demonstrates the ability to make significant improvements in function in a reasonable and predictable amount of time.    01/22/2022   2:00 PM Frequency and Duration  Speech Therapy Frequency (ACUTE ONLY) min 2x/week Treatment Duration 2 weeks     01/22/2022   2:00 PM Oral Phase Oral Phase Impaired Oral - Thin Teaspoon WFL Oral - Thin Cup Decreased bolus cohesion;Lingual/palatal residue Oral - Thin Straw Decreased bolus cohesion;Lingual/palatal residue;Premature spillage Oral - Puree Reduced posterior propulsion Oral - Regular Reduced posterior propulsion    01/22/2022   2:00 PM Pharyngeal Phase Pharyngeal Phase Impaired Pharyngeal- Nectar Cup Reduced anterior laryngeal mobility;Penetration/Aspiration before swallow Pharyngeal Material enters airway, remains ABOVE vocal cords then ejected out Pharyngeal- Nectar Straw Reduced anterior laryngeal mobility;Penetration/Aspiration before swallow Pharyngeal Material enters airway, remains ABOVE vocal cords then ejected out Pharyngeal- Thin Teaspoon Reduced tongue base retraction;Reduced anterior laryngeal mobility Pharyngeal-  Thin Cup Reduced anterior laryngeal mobility;Reduced tongue base retraction;Pharyngeal residue - valleculae;Penetration/Aspiration before swallow;Reduced epiglottic inversion Pharyngeal Material enters airway, remains ABOVE vocal cords and not ejected out Pharyngeal- Thin Straw Reduced anterior laryngeal mobility;Reduced tongue base retraction;Pharyngeal residue - valleculae;Penetration/Aspiration before swallow;Reduced epiglottic inversion Pharyngeal Material enters airway, passes BELOW cords without attempt by patient to eject out (silent aspiration) Pharyngeal- Puree Reduced anterior laryngeal mobility Pharyngeal- Regular Reduced epiglottic inversion;Reduced anterior laryngeal mobility    01/22/2022   2:00 PM Cervical Esophageal Phase  Cervical Esophageal Phase -- Osie Bond., M.A. [facility] [phone] Secure chat preferred 01/22/2022, 3:25 PM                     DG Chest 2 View  Result Date: 01/22/2022 CLINICAL DATA:  Shortness  of breath.  Hypoxia. EXAM: CHEST - 2 VIEW COMPARISON:  01/20/2022 FINDINGS: Cardiomegaly. Aortic atherosclerosis. Patchy density at the lung bases consistent with atelectasis/pneumonia. No dense consolidation, collapse or effusion. No acute bone finding. IMPRESSION: Patchy density at the lung bases consistent with atelectasis/pneumonia. Electronically Signed   By: Nelson Chimes M.D.   On: 01/22/2022 14:27   DG CHEST PORT 1 VIEW  Result Date: 01/20/2022 CLINICAL DATA:  Sputum production EXAM: PORTABLE CHEST 1 VIEW COMPARISON:  January 18, 2022 chest x-ray FINDINGS: No pneumothorax. Stable cardiomegaly. The hila and mediastinum are unchanged. Mild bibasilar opacities are stable. No other interval changes. IMPRESSION: Mild bibasilar opacities may represent atelectasis or developing infiltrates. Recommend attention on follow-up. Electronically Signed   By: Dorise Bullion III M.D.   On: 01/20/2022 11:44   DG ESOPHAGUS W SINGLE CM (SOL OR THIN BA)  Result Date: 01/18/2022 CLINICAL DATA:  Complaint of esophageal dysphagia with sensation of stasis retrosternally after eating. Request for esophagram after speech therapy recommendation. EXAM: ESOPHAGUS/BARIUM SWALLOW/TABLET STUDY TECHNIQUE: Single contrast examination was performed using thin liquid barium. Limited study due to patient's inability to turn head or move on the table into different positions. This exam was performed by Ascencion Dike PA-C , and was supervised and interpreted by Dr. Suzy Bouchard. FLUOROSCOPY: Radiation Exposure Index (as provided by the fluoroscopic device): 56.20 mGy Kerma COMPARISON:  None Available. FINDINGS: Swallowing: Appears normal. No vestibular penetration or aspiration seen. Lateral view assessment could not be performed. Pharynx: Unremarkable. Esophagus: No definitive mucosal lesions or masses. Persistent narrowing of the distal esophagus just above the GE junction. 13 mm barium tablet failed to pass the distal esophageal  narrowing. Tablet would not move further despite additional thin barium and water. Barium and water do pass freely around the tablet. Esophageal motility: Limited evaluation of motility as patient could not lay in the prone position. Evidence of dysmotility present, likely presbyesophagus, resulting in some contrast stasis within the esophagus. Hiatal Hernia: None. Gastroesophageal reflux: None visualized. Other: None. IMPRESSION: 1. Persistent narrowing / stenosis of the distal esophagus with failure of the barium tab to pass. Recommend endoscopy to evaluate for benign or malignant stenosis of the distal esophagus. 2. No mucosal irregularity identified in esophagus. 3. Mild esophageal dysmotility favored presbyesophagus. Electronically Signed   By: Suzy Bouchard M.D.   On: 01/18/2022 14:09   DG Chest Port 1 View  Result Date: 01/18/2022 CLINICAL DATA:  Respiratory failure EXAM: PORTABLE CHEST 1 VIEW COMPARISON:  Radiograph 01/16/2022 FINDINGS: Unchanged enlarged cardiac silhouette. Bibasilar consolidations, similar to prior. Mildly increased interstitial opacities. No large pleural effusion or evidence of pneumothorax. Bones are unchanged. IMPRESSION: Bibasilar airspace disease, not significantly changed from  prior, compatible with pneumonia. Mildly increased interstitial opacities could reflect a degree of interstitial edema. Electronically Signed   By: Maurine Simmering M.D.   On: 01/18/2022 08:38   ECHOCARDIOGRAM COMPLETE  Result Date: 01/17/2022    ECHOCARDIOGRAM REPORT   Patient Name:   JARRELL ARMOND Date of Exam: 01/17/2022 Medical Rec #:  [id number]         Height:       73.0 in Accession #:    [id number]        Weight:       315.0 lb Date of Birth:  15-Oct-1940         BSA:          2.610 m Patient Age:    45 years          BP:           134/61 mmHg Patient Gender: M                 HR:           79 bpm. Exam Location:  Inpatient Procedure: 2D Echo, Cardiac Doppler, Color Doppler and Intracardiac             Opacification Agent Indications:    Elevated Troponin  History:        Patient has prior history of Echocardiogram examinations, most                 recent 09/29/2019. CHF, CAD, COPD, Signs/Symptoms:Dyspnea, Chest                 Pain, Shortness of Breath and Syncope; Risk                 Factors:Hypertension, Sleep Apnea and Dyslipidemia.  Sonographer:    Ronny Flurry Referring Phys: [id number] RAHUL P DESAI IMPRESSIONS  1. Left ventricular ejection fraction, by estimation, is 55 to 60%. The left ventricle has normal function. The left ventricle has no regional wall motion abnormalities. There is moderate concentric left ventricular hypertrophy. Indeterminate diastolic filling due to E-A fusion.  2. Right ventricular systolic function was not well visualized. The right ventricular size is not well visualized.  3. The mitral valve was not well visualized. No evidence of mitral valve regurgitation.  4. The aortic valve was not well visualized. Aortic valve regurgitation is not visualized. No aortic stenosis is present.  5. Aortic dilatation noted. There is mild dilatation of the aortic root, measuring 41 mm.  6. The inferior vena cava is normal in size with greater than 50% respiratory variability, suggesting right atrial pressure of 3 mmHg. Comparison(s): No significant change from prior study. FINDINGS  Left Ventricle: Left ventricular ejection fraction, by estimation, is 55 to 60%. The left ventricle has normal function. The left ventricle has no regional wall motion abnormalities. Definity contrast agent was given IV to delineate the left ventricular  endocardial borders. The left ventricular internal cavity size was normal in size. There is moderate concentric left ventricular hypertrophy. Indeterminate diastolic filling due to E-A fusion. Right Ventricle: The right ventricular size is not well visualized. Right vetricular wall thickness was not well visualized. Right ventricular systolic function was  not well visualized. Left Atrium: Left atrial size was normal in size. Right Atrium: Right atrial size was normal in size. Pericardium: There is no evidence of pericardial effusion. Presence of epicardial fat layer. Mitral Valve: The mitral valve was not well visualized. There is mild calcification of the anterior mitral valve leaflet(s).  No evidence of mitral valve regurgitation. Tricuspid Valve: The tricuspid valve is grossly normal. Tricuspid valve regurgitation is trivial. No evidence of tricuspid stenosis. Aortic Valve: The aortic valve was not well visualized. Aortic valve regurgitation is not visualized. No aortic stenosis is present. Aortic valve mean gradient measures 8.0 mmHg. Aortic valve peak gradient measures 13.7 mmHg. Aortic valve area, by VTI measures 2.66 cm. Pulmonic Valve: The pulmonic valve was grossly normal. Pulmonic valve regurgitation is not visualized. No evidence of pulmonic stenosis. Aorta: Aortic dilatation noted. There is mild dilatation of the aortic root, measuring 41 mm. Venous: The inferior vena cava is normal in size with greater than 50% respiratory variability, suggesting right atrial pressure of 3 mmHg. IAS/Shunts: The interatrial septum was not well visualized.  LEFT VENTRICLE PLAX 2D LVIDd:         5.80 cm   Diastology LVIDs:         4.30 cm   LV e' medial:    4.32 cm/s LV PW:         1.40 cm   LV E/e' medial:  18.1 LV IVS:        1.50 cm   LV e' lateral:   5.26 cm/s LVOT diam:     2.30 cm   LV E/e' lateral: 14.9 LV SV:         87 LV SV Index:   33 LVOT Area:     4.15 cm  RIGHT VENTRICLE RV S prime:     16.60 cm/s LEFT ATRIUM           Index LA diam:      3.60 cm 1.38 cm/m LA Vol (A4C): 33.3 ml 12.76 ml/m  AORTIC VALVE AV Area (Vmax):    2.79 cm AV Area (Vmean):   2.63 cm AV Area (VTI):     2.66 cm AV Vmax:           185.00 cm/s AV Vmean:          133.000 cm/s AV VTI:            0.327 m AV Peak Grad:      13.7 mmHg AV Mean Grad:      8.0 mmHg LVOT Vmax:         124.33  cm/s LVOT Vmean:        84.033 cm/s LVOT VTI:          0.210 m LVOT/AV VTI ratio: 0.64  AORTA Ao Root diam: 4.10 cm Ao Asc diam:  3.30 cm MITRAL VALVE MV Area (PHT): 3.12 cm    SHUNTS MV Decel Time: 243 msec    Systemic VTI:  0.21 m MV E velocity: 78.30 cm/s  Systemic Diam: 2.30 cm MV A velocity: 60.00 cm/s MV E/A ratio:  1.31 Eleonore Chiquito MD Electronically signed by Eleonore Chiquito MD Signature Date/Time: 01/17/2022/3:05:57 PM    Final    CT Angio Chest/Abd/Pel for Dissection W and/or Wo Contrast  Result Date: 01/16/2022 CLINICAL DATA:  history of AAA, in HF, complaining of abdominal pain, vomiting, SOB. Ulcerative colitis. EXAM: CT ANGIOGRAPHY CHEST, ABDOMEN AND PELVIS TECHNIQUE: Non-contrast CT of the chest was initially obtained. Multidetector CT imaging through the chest, abdomen and pelvis was performed using the standard protocol during bolus administration of intravenous contrast. Multiplanar reconstructed images and MIPs were obtained and reviewed to evaluate the vascular anatomy. RADIATION DOSE REDUCTION: This exam was performed according to the departmental dose-optimization program which includes automated exposure control, adjustment of the mA  and/or kV according to patient size and/or use of iterative reconstruction technique. CONTRAST:  19m OMNIPAQUE IOHEXOL 350 MG/ML SOLN COMPARISON:  None Available. FINDINGS: CTA CHEST FINDINGS Cardiovascular: Preferential opacification of the thoracic aorta. No evidence of thoracic aortic aneurysm or dissection. normal heart size. No significant pericardial effusion. Moderate to severe atherosclerotic plaque of the thoracic aorta. Four-vessel coronary artery calcifications. No central pulmonary embolus. Main pulmonary artery is mildly enlarged measuring up to 3.2 cm. Unable to evaluate more distally due to timing of contrast. Mediastinum/Nodes: No enlarged mediastinal, hilar, or axillary lymph nodes. Thyroid gland, trachea, and esophagus demonstrate no  significant findings. Lungs/Pleura: Severe centrilobular and at least moderate paraseptal emphysematous changes. Heterogeneous bilateral lower lobe likely airspace opacities. Right middle lobe peribronchovascular patchy airspace opacities. No pulmonary nodule. No pulmonary mass. No pleural effusion. No pneumothorax. Musculoskeletal: No chest wall abnormality. No suspicious lytic or blastic osseous lesions. No acute displaced fracture. Continuous bridging osteophytes and spinous process ligament calcification. At least moderate degenerative changes of the bilateral shoulders. Review of the MIP images confirms the above findings. CTA ABDOMEN AND PELVIS FINDINGS VASCULAR Aorta: Severe normal caliber aorta without aneurysm, dissection, vasculitis or significant stenosis. Celiac: Moderate atherosclerotic plaque with moderate narrowing of the origin. Patent without evidence of aneurysm, dissection, vasculitis or significant stenosis. SMA: Moderate atherosclerotic plaque with moderate narrowing of the origin. Patent without evidence of aneurysm, dissection, vasculitis or significant stenosis. Renals: Both renal arteries are patent without evidence of aneurysm, dissection, vasculitis, fibromuscular dysplasia or significant stenosis. IMA: Patent without evidence of aneurysm, dissection, vasculitis or significant stenosis. Inflow: Mild-to-moderate patent without evidence of aneurysm, dissection, vasculitis or significant stenosis. Veins: No obvious venous abnormality within the limitations of this arterial phase study. Review of the MIP images confirms the above findings. NON-VASCULAR Hepatobiliary: No focal liver abnormality. Layering calcified stones noted within the gallbladder lumen. No gallbladder wall thickening or pericholecystic fluid. No biliary dilatation. Pancreas: No focal lesion. Normal pancreatic contour. No surrounding inflammatory changes. No main pancreatic ductal dilatation. Spleen: Normal in size without  focal abnormality. Adrenals/Urinary Tract: No adrenal nodule bilaterally. Bilateral kidneys enhance symmetrically. Punctate to 2 mm calcifications within the kidneys. No ureterolithiasis. No hydronephrosis. No hydroureter. The urinary bladder is unremarkable. Stomach/Bowel: Stomach is within normal limits. No evidence of bowel wall thickening or dilatation. Loss of haustral markings along the rectosigmoid colon suggestive of ulcerative colitis. Colonic diverticulosis. Appendix appears normal. Lymphatic: No lymphadenopathy. Reproductive: Prostate is unremarkable. Other: No intraperitoneal free fluid. No intraperitoneal free gas. No organized fluid collection. Musculoskeletal: Tiny fat containing umbilical hernia. Right hip subcutaneus soft tissue edema possible hematoma formation (7:310). No suspicious lytic or blastic osseous lesions. No acute displaced fracture. Continuous bridging osteophyte formation. Partial ankylosis of the sacroiliac joints. Review of the MIP images confirms the above findings. IMPRESSION: 1. No acute thoracic or abdominal aorta abnormality. 2. 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 3. No central pulmonary embolus. Unable to evaluate more distally due to timing of contrast. 4. Bilateral lower lobe airspace opacities as well as right middle lobe peribronchovascular patchy airspace opacities consistent with pneumonia. 5. Cholelithiasis with no CT finding of acute cholecystitis. 6. Nonobstructive bilateral nephrolithiasis. 7. Colonic diverticulosis with no acute diverticulitis. 8. Ankylosing spondylitis in a patient with ulcers colitis. 9. Right hip subcutaneus soft tissue edema possible hematoma formation. No underlying acute fracture. Electronically Signed   By: MIven FinnM.D.   On: 01/16/2022 22:48   DG Chest 1  View  Result Date: 01/16/2022 CLINICAL DATA:  [id number] SOB (shortness of breath) [id number] EXAM:  CHEST  1 VIEW COMPARISON:  Chest x-ray 08/21/2021 FINDINGS: Enlarged cardiac silhouette. The heart and mediastinal contours are unchanged given change in technique. Aortic calcification. Low lung volumes. Bibasilar airspace opacities. Increased interstitial markings. Bilateral trace to small volume, left greater than right, pleural effusions. No pneumothorax. No acute osseous abnormality. IMPRESSION: 1. Cardiomegaly with pulmonary edema. 2. Bilateral trace to small volume, left greater than right, pleural effusions. 3. Bibasilar airspace opacities may represent combination of atelectasis versus infection/inflammation. 4.  Aortic Atherosclerosis (ICD10-I70.0). Electronically Signed   By: Iven Finn M.D.   On: 01/16/2022 19:25    Microbiology: Recent Results (from the past 240 hour(s))  Resp panel by RT-PCR (RSV, Flu A&B, Covid) Anterior Nasal Swab     Status: None   Collection Time: 01/16/22  6:34 PM   Specimen: Anterior Nasal Swab  Result Value Ref Range Status   SARS Coronavirus 2 by RT PCR NEGATIVE NEGATIVE Final    Comment: (NOTE) SARS-CoV-2 target nucleic acids are NOT DETECTED.  The SARS-CoV-2 RNA is generally detectable in upper respiratory specimens during the acute phase of infection. The lowest concentration of SARS-CoV-2 viral copies this assay can detect is 138 copies/mL. A negative result does not preclude SARS-Cov-2 infection and should not be used as the sole basis for treatment or other patient management decisions. A negative result may occur with  improper specimen collection/handling, submission of specimen other than nasopharyngeal swab, presence of viral mutation(s) within the areas targeted by this assay, and inadequate number of viral copies(<138 copies/mL). A negative result must be combined with clinical observations, patient history, and epidemiological information. The expected result is Negative.  Fact Sheet for Patients:   EntrepreneurPulse.com.au  Fact Sheet for Healthcare Providers:  IncredibleEmployment.be  This test is no t yet approved or cleared by the [place] FDA and  has been authorized for detection and/or diagnosis of SARS-CoV-2 by FDA under an Emergency Use Authorization (EUA). This EUA will remain  in effect (meaning this test can be used) for the duration of the COVID-19 declaration under Section 564(b)(1) of the Act, 21 U.S.C.section 360bbb-3(b)(1), unless the authorization is terminated  or revoked sooner.       Influenza A by PCR NEGATIVE NEGATIVE Final   Influenza B by PCR NEGATIVE NEGATIVE Final    Comment: (NOTE) The Xpert Xpress SARS-CoV-2/FLU/RSV plus assay is intended as an aid in the diagnosis of influenza from Nasopharyngeal swab specimens and should not be used as a sole basis for treatment. Nasal washings and aspirates are unacceptable for Xpert Xpress SARS-CoV-2/FLU/RSV testing.  Fact Sheet for Patients: EntrepreneurPulse.com.au  Fact Sheet for Healthcare Providers: IncredibleEmployment.be  This test is not yet approved or cleared by the [place] FDA and has been authorized for detection and/or diagnosis of SARS-CoV-2 by FDA under an Emergency Use Authorization (EUA). This EUA will remain in effect (meaning this test can be used) for the duration of the COVID-19 declaration under Section 564(b)(1) of the Act, 21 U.S.C. section 360bbb-3(b)(1), unless the authorization is terminated or revoked.     Resp Syncytial Virus by PCR NEGATIVE NEGATIVE Final    Comment: (NOTE) Fact Sheet for Patients: EntrepreneurPulse.com.au  Fact Sheet for Healthcare Providers: IncredibleEmployment.be  This test is not yet approved or cleared by the [place] FDA and has been authorized for detection and/or diagnosis of SARS-CoV-2 by FDA under an Emergency Use  Authorization (EUA). This EUA will  remain in effect (meaning this test can be used) for the duration of the COVID-19 declaration under Section 564(b)(1) of the Act, 21 U.S.C. section 360bbb-3(b)(1), unless the authorization is terminated or revoked.  Performed at [facility], [place] [address], [place], [place] [address]   Culture, blood (Routine X 2) w Reflex to ID Panel     Status: None   Collection Time: 01/16/22  8:00 PM   Specimen: BLOOD  Result Value Ref Range Status   Specimen Description BLOOD LEFT ANTECUBITAL  Final   Special Requests   Final    BOTTLES DRAWN AEROBIC AND ANAEROBIC Blood Culture adequate volume   Culture   Final    NO GROWTH 5 DAYS Performed at [facility], [place] [address], [place], [place] [address]    Report Status 01/22/2022 FINAL  Final  Blood culture (routine x 2)     Status: None   Collection Time: 01/16/22  8:14 PM   Specimen: BLOOD LEFT FOREARM  Result Value Ref Range Status   Specimen Description BLOOD LEFT FOREARM  Final   Special Requests   Final    BOTTLES DRAWN AEROBIC AND ANAEROBIC Blood Culture adequate volume   Culture   Final    NO GROWTH 5 DAYS Performed at [facility], [place] [address], [place], [place] [address]    Report Status 01/21/2022 FINAL  Final  MRSA Next Gen by PCR, Nasal     Status: None   Collection Time: 01/17/22  3:00 AM  Result Value Ref Range Status   MRSA by PCR Next Gen NOT DETECTED NOT DETECTED Final    Comment: (NOTE) The GeneXpert MRSA Assay (FDA approved for NASAL specimens only), is one component of a comprehensive MRSA colonization surveillance program. It is not intended to diagnose MRSA infection nor to guide or monitor treatment for MRSA infections. Test performance is not FDA approved in patients less than 59 years old. Performed at [facility], [place] [address] [place], [place] [address]   Culture, Respiratory w Gram Stain     Status: None (Preliminary result)   Collection  Time: 01/23/22  6:20 PM   Specimen: Tracheal Aspirate; Respiratory  Result Value Ref Range Status   Specimen Description TRACHEAL ASPIRATE  Final   Special Requests NONE  Final   Gram Stain   Final    RARE WBC PRESENT, PREDOMINANTLY PMN FEW GRAM POSITIVE COCCI IN CLUSTERS FEW GRAM POSITIVE COCCI IN CHAINS FEW GRAM NEGATIVE RODS FEW GRAM NEGATIVE DIPLOCOCCI    Culture   Final    CULTURE REINCUBATED FOR BETTER GROWTH Performed at [facility], [place] [address] [place], [place] [address]    Report Status PENDING  Incomplete     Labs: Basic Metabolic Panel: Recent Labs  Lab 01/21/22 0243 01/22/22 0429 01/23/22 0400 01/24/22 0151 01/25/22 1144  NA 137 136 135 137 135  K 3.6 3.4* 3.8 4.2 4.7  CL 100 98 101 101 98  CO2 28 30 26 28 28   GLUCOSE 139* 138* 136* 142* 133*  BUN 30* 30* 25* 34* 39*  CREATININE 1.42* 1.33* 1.21 1.40* 1.48*  CALCIUM 9.8 10.3 9.9 10.4* 11.0*  MG 2.2 2.3  --   --   --    Liver Function Tests: Recent Labs  Lab 01/22/22 0429 01/23/22 0400 01/24/22 0151  AST 32 33 40  ALT 27 28 31   ALKPHOS 90 82 84  BILITOT 0.6 0.4 0.7  PROT 6.6 6.3*  6.6  ALBUMIN 2.4* 2.2* 2.3*   No results for input(s): "LIPASE", "AMYLASE" in the last 168 hours. No results for input(s): "AMMONIA" in the last 168 hours. CBC: Recent Labs  Lab 01/20/22 0259 01/21/22 0243 01/22/22 0429 01/23/22 0400 01/24/22 0151  WBC 12.2* 14.8* 12.0* 12.7* 12.2*  HGB 13.9 13.0 13.8 12.4* 12.2*  HCT 42.0 40.0 41.9 38.7* 37.5*  MCV 91.1 92.2 92.7 93.9 94.0  PLT 270 265 243 275 260   Cardiac Enzymes: No results for input(s): "CKTOTAL", "CKMB", "CKMBINDEX", "TROPONINI" in the last 168 hours. BNP: BNP (last 3 results) Recent Labs    08/21/21 1553 01/16/22 1831 01/17/22 0231  BNP 89.8 192.4* 159.1*    ProBNP (last 3 results) No results for input(s): "PROBNP" in the last 8760 hours.  CBG: No results for input(s): "GLUCAP" in the last 168 hours.     Signed:  Domenic Polite MD.  [facility] 01/26/2022, 10:25 AM

## 2022-01-30 ENCOUNTER — Ambulatory Visit: Payer: Medicare Other | Admitting: Podiatry

## 2022-01-30 DIAGNOSIS — J9622 Acute and chronic respiratory failure with hypercapnia: Secondary | ICD-10-CM | POA: Diagnosis not present

## 2022-01-30 DIAGNOSIS — N1831 Chronic kidney disease, stage 3a: Secondary | ICD-10-CM | POA: Diagnosis not present

## 2022-01-30 DIAGNOSIS — G4733 Obstructive sleep apnea (adult) (pediatric): Secondary | ICD-10-CM | POA: Diagnosis not present

## 2022-01-30 DIAGNOSIS — I5032 Chronic diastolic (congestive) heart failure: Secondary | ICD-10-CM | POA: Diagnosis not present

## 2022-01-30 DIAGNOSIS — I5033 Acute on chronic diastolic (congestive) heart failure: Secondary | ICD-10-CM | POA: Diagnosis not present

## 2022-01-30 DIAGNOSIS — J449 Chronic obstructive pulmonary disease, unspecified: Secondary | ICD-10-CM | POA: Diagnosis not present

## 2022-01-30 DIAGNOSIS — I10 Essential (primary) hypertension: Secondary | ICD-10-CM | POA: Diagnosis not present

## 2022-01-30 DIAGNOSIS — E785 Hyperlipidemia, unspecified: Secondary | ICD-10-CM | POA: Diagnosis not present

## 2022-01-30 DIAGNOSIS — I2510 Atherosclerotic heart disease of native coronary artery without angina pectoris: Secondary | ICD-10-CM | POA: Diagnosis not present

## 2022-01-31 DIAGNOSIS — I169 Hypertensive crisis, unspecified: Secondary | ICD-10-CM | POA: Diagnosis not present

## 2022-01-31 DIAGNOSIS — J441 Chronic obstructive pulmonary disease with (acute) exacerbation: Secondary | ICD-10-CM | POA: Diagnosis not present

## 2022-01-31 DIAGNOSIS — E876 Hypokalemia: Secondary | ICD-10-CM | POA: Diagnosis not present

## 2022-01-31 DIAGNOSIS — M6281 Muscle weakness (generalized): Secondary | ICD-10-CM | POA: Diagnosis not present

## 2022-01-31 DIAGNOSIS — N1831 Chronic kidney disease, stage 3a: Secondary | ICD-10-CM | POA: Diagnosis not present

## 2022-01-31 DIAGNOSIS — R4702 Dysphasia: Secondary | ICD-10-CM | POA: Diagnosis not present

## 2022-01-31 DIAGNOSIS — J9601 Acute respiratory failure with hypoxia: Secondary | ICD-10-CM | POA: Diagnosis not present

## 2022-01-31 DIAGNOSIS — I5032 Chronic diastolic (congestive) heart failure: Secondary | ICD-10-CM | POA: Diagnosis not present

## 2022-02-01 DIAGNOSIS — I169 Hypertensive crisis, unspecified: Secondary | ICD-10-CM | POA: Diagnosis not present

## 2022-02-01 DIAGNOSIS — M6281 Muscle weakness (generalized): Secondary | ICD-10-CM | POA: Diagnosis not present

## 2022-02-01 DIAGNOSIS — J441 Chronic obstructive pulmonary disease with (acute) exacerbation: Secondary | ICD-10-CM | POA: Diagnosis not present

## 2022-02-01 DIAGNOSIS — R4702 Dysphasia: Secondary | ICD-10-CM | POA: Diagnosis not present

## 2022-02-01 DIAGNOSIS — J9601 Acute respiratory failure with hypoxia: Secondary | ICD-10-CM | POA: Diagnosis not present

## 2022-02-01 DIAGNOSIS — I5032 Chronic diastolic (congestive) heart failure: Secondary | ICD-10-CM | POA: Diagnosis not present

## 2022-02-01 DIAGNOSIS — J449 Chronic obstructive pulmonary disease, unspecified: Secondary | ICD-10-CM | POA: Diagnosis not present

## 2022-02-01 DIAGNOSIS — E876 Hypokalemia: Secondary | ICD-10-CM | POA: Diagnosis not present

## 2022-02-01 DIAGNOSIS — N1831 Chronic kidney disease, stage 3a: Secondary | ICD-10-CM | POA: Diagnosis not present

## 2022-02-02 ENCOUNTER — Other Ambulatory Visit: Payer: Self-pay | Admitting: Family Medicine

## 2022-02-02 DIAGNOSIS — J9622 Acute and chronic respiratory failure with hypercapnia: Secondary | ICD-10-CM | POA: Diagnosis not present

## 2022-02-02 DIAGNOSIS — R4702 Dysphasia: Secondary | ICD-10-CM | POA: Diagnosis not present

## 2022-02-02 DIAGNOSIS — J449 Chronic obstructive pulmonary disease, unspecified: Secondary | ICD-10-CM | POA: Diagnosis not present

## 2022-02-02 DIAGNOSIS — M6281 Muscle weakness (generalized): Secondary | ICD-10-CM | POA: Diagnosis not present

## 2022-02-02 DIAGNOSIS — E785 Hyperlipidemia, unspecified: Secondary | ICD-10-CM | POA: Diagnosis not present

## 2022-02-02 DIAGNOSIS — J441 Chronic obstructive pulmonary disease with (acute) exacerbation: Secondary | ICD-10-CM | POA: Diagnosis not present

## 2022-02-02 DIAGNOSIS — I5033 Acute on chronic diastolic (congestive) heart failure: Secondary | ICD-10-CM | POA: Diagnosis not present

## 2022-02-02 DIAGNOSIS — J9601 Acute respiratory failure with hypoxia: Secondary | ICD-10-CM | POA: Diagnosis not present

## 2022-02-02 DIAGNOSIS — E876 Hypokalemia: Secondary | ICD-10-CM | POA: Diagnosis not present

## 2022-02-02 DIAGNOSIS — I169 Hypertensive crisis, unspecified: Secondary | ICD-10-CM | POA: Diagnosis not present

## 2022-02-02 DIAGNOSIS — I10 Essential (primary) hypertension: Secondary | ICD-10-CM | POA: Diagnosis not present

## 2022-02-02 DIAGNOSIS — I5032 Chronic diastolic (congestive) heart failure: Secondary | ICD-10-CM | POA: Diagnosis not present

## 2022-02-02 DIAGNOSIS — I2510 Atherosclerotic heart disease of native coronary artery without angina pectoris: Secondary | ICD-10-CM | POA: Diagnosis not present

## 2022-02-02 DIAGNOSIS — N1831 Chronic kidney disease, stage 3a: Secondary | ICD-10-CM | POA: Diagnosis not present

## 2022-02-02 DIAGNOSIS — G4733 Obstructive sleep apnea (adult) (pediatric): Secondary | ICD-10-CM | POA: Diagnosis not present

## 2022-02-06 DIAGNOSIS — J449 Chronic obstructive pulmonary disease, unspecified: Secondary | ICD-10-CM | POA: Diagnosis not present

## 2022-02-06 DIAGNOSIS — E876 Hypokalemia: Secondary | ICD-10-CM | POA: Diagnosis not present

## 2022-02-06 DIAGNOSIS — I5032 Chronic diastolic (congestive) heart failure: Secondary | ICD-10-CM | POA: Diagnosis not present

## 2022-02-06 DIAGNOSIS — J9622 Acute and chronic respiratory failure with hypercapnia: Secondary | ICD-10-CM | POA: Diagnosis not present

## 2022-02-06 DIAGNOSIS — I5033 Acute on chronic diastolic (congestive) heart failure: Secondary | ICD-10-CM | POA: Diagnosis not present

## 2022-02-06 DIAGNOSIS — J441 Chronic obstructive pulmonary disease with (acute) exacerbation: Secondary | ICD-10-CM | POA: Diagnosis not present

## 2022-02-06 DIAGNOSIS — L988 Other specified disorders of the skin and subcutaneous tissue: Secondary | ICD-10-CM | POA: Diagnosis not present

## 2022-02-06 DIAGNOSIS — J9601 Acute respiratory failure with hypoxia: Secondary | ICD-10-CM | POA: Diagnosis not present

## 2022-02-06 DIAGNOSIS — I10 Essential (primary) hypertension: Secondary | ICD-10-CM | POA: Diagnosis not present

## 2022-02-06 DIAGNOSIS — E785 Hyperlipidemia, unspecified: Secondary | ICD-10-CM | POA: Diagnosis not present

## 2022-02-06 DIAGNOSIS — R4702 Dysphasia: Secondary | ICD-10-CM | POA: Diagnosis not present

## 2022-02-06 DIAGNOSIS — N1831 Chronic kidney disease, stage 3a: Secondary | ICD-10-CM | POA: Diagnosis not present

## 2022-02-06 DIAGNOSIS — I169 Hypertensive crisis, unspecified: Secondary | ICD-10-CM | POA: Diagnosis not present

## 2022-02-06 DIAGNOSIS — G4733 Obstructive sleep apnea (adult) (pediatric): Secondary | ICD-10-CM | POA: Diagnosis not present

## 2022-02-06 DIAGNOSIS — M6281 Muscle weakness (generalized): Secondary | ICD-10-CM | POA: Diagnosis not present

## 2022-02-06 DIAGNOSIS — I2510 Atherosclerotic heart disease of native coronary artery without angina pectoris: Secondary | ICD-10-CM | POA: Diagnosis not present

## 2022-02-07 ENCOUNTER — Ambulatory Visit: Payer: Medicare Other | Admitting: Family Medicine

## 2022-02-07 DIAGNOSIS — R4702 Dysphasia: Secondary | ICD-10-CM | POA: Diagnosis not present

## 2022-02-07 DIAGNOSIS — M6281 Muscle weakness (generalized): Secondary | ICD-10-CM | POA: Diagnosis not present

## 2022-02-07 DIAGNOSIS — E876 Hypokalemia: Secondary | ICD-10-CM | POA: Diagnosis not present

## 2022-02-07 DIAGNOSIS — N1831 Chronic kidney disease, stage 3a: Secondary | ICD-10-CM | POA: Diagnosis not present

## 2022-02-07 DIAGNOSIS — R600 Localized edema: Secondary | ICD-10-CM | POA: Diagnosis not present

## 2022-02-07 DIAGNOSIS — J441 Chronic obstructive pulmonary disease with (acute) exacerbation: Secondary | ICD-10-CM | POA: Diagnosis not present

## 2022-02-07 DIAGNOSIS — J9601 Acute respiratory failure with hypoxia: Secondary | ICD-10-CM | POA: Diagnosis not present

## 2022-02-07 DIAGNOSIS — I5032 Chronic diastolic (congestive) heart failure: Secondary | ICD-10-CM | POA: Diagnosis not present

## 2022-02-07 DIAGNOSIS — I169 Hypertensive crisis, unspecified: Secondary | ICD-10-CM | POA: Diagnosis not present

## 2022-02-07 NOTE — Progress Notes (Signed)
   Subjective:    Patient ID: [name], male    DOB: 1940/09/03, 82 y.o.   MRN: [id number]  HPI Currently In [place], gaining and losing fluid , daughter reports he is not getting appropriate care in facility,  checking medications    Review of Systems     Objective:   Physical Exam        Assessment & Plan:  Duplicate note This note will be deleted

## 2022-02-08 ENCOUNTER — Ambulatory Visit: Payer: Medicare Other | Attending: Nurse Practitioner | Admitting: Nurse Practitioner

## 2022-02-08 NOTE — Progress Notes (Deleted)
Office Visit    Patient Name: [name] Date of Encounter: 02/08/2022  Primary Care Provider:  [name], MD Primary Cardiologist:  Quay Burow, MD  Chief Complaint    82 year old male with a history of CAD s/p stenting-LAD in 1856, chronic diastolic heart failure, COPD, chronic respiratory failure on home O2 4L, CKD stage IIIa, OSA, dysphagia and dementia who presents for hospital follow-up related to heart failure.  Past Medical History    Past Medical History:  Diagnosis Date   Ankylosing spondylitis ([place]) 11/07/2018   Asthma    BPH (benign prostatic hyperplasia)    CAD (coronary artery disease) 01/1995   CHF (congestive heart failure) ([name])    Clostridium difficile colitis 04/2005   Colitis 2011   COPD (chronic obstructive pulmonary disease) ([name])    Depression    Diverticulosis    DJD (degenerative joint disease)    Gastric ulcer 04/17/10   Three 46m gastric ulcers, H.pylori serologies were negative   GERD (gastroesophageal reflux disease)    History of kidney stones    Hyperlipidemia    Hypertension    Idiopathic chronic inflammatory bowel disease 05/18/2010   left-sided UC   Kidney stone    Morbid obesity ([place] 03/12/2018   Obstructive sleep apnea    on Cpap   Reflux 02/1995   S/P endoscopy 07/24/10   retained gastric contents, benign bx   Past Surgical History:  Procedure Laterality Date   ANORECTAL MANOMETRY  2016   [facility]: concern for possible fissure. Noted pelvic floor dyssnyergy   BIOPSY  07/29/2017   Procedure: BIOPSY;  Surgeon: RDaneil Dolin MD;  [facility];  Service: Endoscopy;;  ascending and sigmoid colon   CARDIAC CATHETERIZATION     with stent   CARDIOVASCULAR STRESS TEST  07/21/2009   No scintigraphic evidence of inducible myocardial ischemia   CARPAL TUNNEL RELEASE Left 01/17/2017   Procedure: LEFT CARPAL TUNNEL RELEASE;  Surgeon: HCarole Civil MD;  Location: [facility];  Service: Orthopedics;  Laterality: Left;    CATARACT EXTRACTION, BILATERAL Bilateral    CERVICAL SPINE SURGERY     C4-5   COLONOSCOPY  04/2005   granularity and friability erosions from rectum to 40cm. Bx infection vs IBD. C. Diff positive at the time.    COLONOSCOPY  05/2010   [facility]: left-sided UC, bx with no dysplasia, shallow diverticula   COLONOSCOPY [name] 11/07/2012   RDJS:HFWY-OVZCHproctocolitis status post segmental biopsy/Sigmoid colon polyps removed as described above. Procedure compromisd by technical difficulties. bx: Inflammation limited to sigmoid and rectum on pathology.   COLONOSCOPY  04/2014   [facility] proctocolitis limited to sigmoid   COLONOSCOPY WITH PROPOFOL [name] 07/29/2017   diverticulosis in colon, three 4-6 mm polyps at splenic flexure and in cecum, one 10 mm polyp in rectum, abnormal rectum and sigmoid consistent with active UC   CORONARY STENT PLACEMENT  01/1995   ESOPHAGEAL DILATION  01/20/2022   Procedure: ESOPHAGEAL DILATION;  Surgeon: BOtis Brace MD;  [facility];  Service: Gastroenterology;;   ESOPHAGOGASTRODUODENOSCOPY  07/24/2010   RYIF:OYDXAJesophagus   ESOPHAGOGASTRODUODENOSCOPY  04/2014   [facility]   ESOPHAGOGASTRODUODENOSCOPY (EGD) WITH PROPOFOL [name] 01/20/2022   Procedure: ESOPHAGOGASTRODUODENOSCOPY (EGD) WITH PROPOFOL;  Surgeon: BOtis Brace MD;  [place]  Service: Gastroenterology;  Laterality: [name];   FOOT SURGERY Bilateral two   KNEE SURGERY  two   POLYPECTOMY  07/29/2017   Procedure:  POLYPECTOMY;  Surgeon: Daneil Dolin, MD;  [facility];  Service: Endoscopy;;  splenic flexure, ascending colon polyp;rectal   SHOULDER SURGERY  two   TONSILLECTOMY     TRANSTHORACIC ECHOCARDIOGRAM  03/23/2009   EF 60-65%, normal LV systolic function   ULNAR HEAD EXCISION Left 01/17/2017   Procedure: LEFT ULNAR HEAD RESECTION;  Surgeon: Carole Civil, MD;  Location: [facility];  Service:  Orthopedics;  Laterality: Left;    Allergies  No Known Allergies  History of Present Illness    82 year old male with the above past medical history including CAD s/p stenting-LAD in 7026, chronic diastolic heart failure, COPD, chronic respiratory failure on home O2 4L, CKD stage IIIa, OSA, dysphagia and dementia.  Echocardiogram in 2007 revealed normal LV function.  Myoview in 2011 was normal.  He was last seen in the office on 09/09/2019 and was stable from a cardiac standpoint.  He noted occasional chest pain and  stable chronic shortness of breath.  Echocardiogram in 09/2019 showed EF 60 to 65%, normal LV function, no RWMA, G1 DD, normal RV systolic function, no significant valvular abnormalities.  Lexiscan Myoview in 09/2019 was normal.  He was hospitalized in 01/2022 in the setting of acute on chronic hypoxic respiratory failure, community-acquired (possible aspiration) pneumonia, acute on chronic diastolic heart failure.  He was admitted to the ICU and treated with antibiotics/diuretics.  Tropponin was elevated, thought to be in the setting of demand ischemia.  Cardiology was consulted. Echocardiogram showed EF 55 to 60%, moderate LVH, indeterminate diastolic parameters.  SGLT2 inhibitor was deferred in the setting of urinary retention.  He noted esophageal dysphagia there was concern for aspiration pneumonia.  Speech therapy GI were consulted.  EGD showed Schatzki ring s/p successful dilation, esophageal dysmotility.  He was discharged to SNF in stable condition on 01/26/2022.  He presents today for follow-up.  Since his hospitalization  Chronic diastolic heart failure: CAD: COPD/chronic respiratory failure: CKD stage IIIa: Aspiration pneumonia: OSA: Disposition:  Home Medications    Current Outpatient Medications  Medication Sig Dispense Refill   acetaminophen (TYLENOL) 500 MG tablet Take 2 tablets (1,000 mg total) by mouth every 6 (six) hours as needed for headache (pain). 30 tablet  0   albuterol (VENTOLIN HFA) 108 (90 Base) MCG/ACT inhaler Inhale 2 puffs into the lungs every 6 (six) hours as needed for wheezing or shortness of breath. 18 g 2   amLODipine (NORVASC) 10 MG tablet Take 1 tablet (10 mg total) by mouth daily.     aspirin EC 81 MG tablet Take 81 mg by mouth daily. Swallow whole.     carvedilol (COREG) 12.5 MG tablet Take 1 tablet (12.5 mg total) by mouth 2 (two) times daily with a meal.     DULoxetine (CYMBALTA) 60 MG capsule TAKE 1 CAPSULE BY MOUTH DAILY (Patient taking differently: Take 60 mg by mouth daily.) 30 capsule 3   furosemide (LASIX) 40 MG tablet Take 1 tablet (40 mg total) by mouth every morning.     hydrALAZINE (APRESOLINE) 100 MG tablet Take 1 tablet (100 mg total) by mouth every 8 (eight) hours.     isosorbide mononitrate (IMDUR) 30 MG 24 hr tablet Take 3 tablets (90 mg total) by mouth daily.     ketoconazole (NIZORAL) 2 % cream APPLY TOPICALLY TWICE DAILY. MIX WITH BARRIER CREAM SUCH AS DESITIN (Patient taking differently: Apply 1 Application topically daily as needed for irritation. MIX WITH BARRIER CREAM SUCH AS DESITIN) 60 g 4  mupirocin ointment (BACTROBAN) 2 % Apply 1 Application topically 2 (two) times daily as needed (wound care/raw areas). 30 g 5   nitroGLYCERIN (NITROSTAT) 0.4 MG SL tablet ONE TABLET UNDER TONGUE AS NEEDED FOR CHEST PAIN EVERY 5 MINUTES AS DIRECTED (Patient taking differently: Place 0.4 mg under the tongue every 5 (five) minutes as needed for chest pain.) 25 tablet 11   nystatin cream (MYCOSTATIN) Apply 1 application topically 2 (two) times daily as needed (rash/itching). 30 g 5   polyethylene glycol (MIRALAX / GLYCOLAX) 17 g packet Take 17 g by mouth daily as needed for moderate constipation. 14 each 0   pravastatin (PRAVACHOL) 40 MG tablet TAKE 1 TABLET(40 MG) BY MOUTH DAILY 90 tablet 0   pyridOXINE (VITAMIN B-6) 100 MG tablet Take 100 mg by mouth daily.     spironolactone (ALDACTONE) 25 MG tablet Take 0.5 tablets (12.5 mg  total) by mouth daily.     tamsulosin (FLOMAX) 0.4 MG CAPS capsule Take 1 capsule (0.4 mg total) by mouth daily. 30 capsule    triamcinolone cream (KENALOG) 0.1 % Apply 1 Application topically 2 (two) times daily. (Patient taking differently: Apply 1 Application topically daily as needed (for itching).) 45 g 4   vitamin C (ASCORBIC ACID) 500 MG tablet Take 500 mg by mouth daily.     Vitamin D, Ergocalciferol, (DRISDOL) 1.25 MG (50000 UNIT) CAPS capsule TAKE 1 CAPSULE BY MOUTH EVERY 7 DAYS (Patient taking differently: Take 50,000 Units by mouth every 7 (seven) days. Monday) 12 capsule 0   No current facility-administered medications for this visit.     Review of Systems    ***.  All other systems reviewed and are otherwise negative except as noted above.    Physical Exam    VS:  There were no vitals taken for this visit. , BMI There is no height or weight on file to calculate BMI.     GEN: Well nourished, well developed, in no acute distress. HEENT: normal. Neck: Supple, no JVD, carotid bruits, or masses. Cardiac: RRR, no murmurs, rubs, or gallops. No clubbing, cyanosis, edema.  Radials/DP/PT 2+ and equal bilaterally.  Respiratory:  Respirations regular and unlabored, clear to auscultation bilaterally. GI: Soft, nontender, nondistended, BS + x 4. MS: no deformity or atrophy. Skin: warm and dry, no rash. Neuro:  Strength and sensation are intact. Psych: Normal affect.  Accessory Clinical Findings    ECG personally reviewed by me today - *** - no acute changes.   Lab Results  Component Value Date   WBC 12.2 (H) 01/24/2022   HGB 12.2 (L) 01/24/2022   HCT 37.5 (L) 01/24/2022   MCV 94.0 01/24/2022   PLT 260 01/24/2022   Lab Results  Component Value Date   CREATININE 1.48 (H) 01/25/2022   BUN 39 (H) 01/25/2022   NA 135 01/25/2022   K 4.7 01/25/2022   CL 98 01/25/2022   CO2 28 01/25/2022   Lab Results  Component Value Date   ALT 31 01/24/2022   AST 40 01/24/2022    ALKPHOS 84 01/24/2022   BILITOT 0.7 01/24/2022   Lab Results  Component Value Date   CHOL 198 12/21/2021   HDL 51 12/21/2021   LDLCALC 98 12/21/2021   TRIG 294 (H) 12/21/2021   CHOLHDL 3.9 12/21/2021    Lab Results  Component Value Date   HGBA1C 6.3 (H) 07/11/2021    Assessment & Plan    1.  ***  No BP recorded.  {Refresh Note OR Click  here to enter BP  :1}***   Lenna Sciara, NP 02/08/2022, 6:49 AM

## 2022-02-09 ENCOUNTER — Encounter: Payer: Self-pay | Admitting: Family Medicine

## 2022-02-09 ENCOUNTER — Ambulatory Visit (INDEPENDENT_AMBULATORY_CARE_PROVIDER_SITE_OTHER): Payer: Medicare Other | Admitting: Family Medicine

## 2022-02-09 VITALS — BP 137/57 | HR 77 | Temp 97.5°F | Ht 73.0 in | Wt 310.0 lb

## 2022-02-09 DIAGNOSIS — E785 Hyperlipidemia, unspecified: Secondary | ICD-10-CM | POA: Diagnosis not present

## 2022-02-09 DIAGNOSIS — E213 Hyperparathyroidism, unspecified: Secondary | ICD-10-CM

## 2022-02-09 DIAGNOSIS — D72829 Elevated white blood cell count, unspecified: Secondary | ICD-10-CM | POA: Diagnosis not present

## 2022-02-09 DIAGNOSIS — I2510 Atherosclerotic heart disease of native coronary artery without angina pectoris: Secondary | ICD-10-CM | POA: Diagnosis not present

## 2022-02-09 DIAGNOSIS — K5190 Ulcerative colitis, unspecified, without complications: Secondary | ICD-10-CM

## 2022-02-09 DIAGNOSIS — J9611 Chronic respiratory failure with hypoxia: Secondary | ICD-10-CM

## 2022-02-09 DIAGNOSIS — J439 Emphysema, unspecified: Secondary | ICD-10-CM | POA: Diagnosis not present

## 2022-02-09 DIAGNOSIS — R4702 Dysphasia: Secondary | ICD-10-CM | POA: Diagnosis not present

## 2022-02-09 DIAGNOSIS — N1831 Chronic kidney disease, stage 3a: Secondary | ICD-10-CM | POA: Diagnosis not present

## 2022-02-09 DIAGNOSIS — G4733 Obstructive sleep apnea (adult) (pediatric): Secondary | ICD-10-CM | POA: Diagnosis not present

## 2022-02-09 DIAGNOSIS — M6281 Muscle weakness (generalized): Secondary | ICD-10-CM | POA: Diagnosis not present

## 2022-02-09 DIAGNOSIS — J441 Chronic obstructive pulmonary disease with (acute) exacerbation: Secondary | ICD-10-CM | POA: Diagnosis not present

## 2022-02-09 DIAGNOSIS — M450 Ankylosing spondylitis of multiple sites in spine: Secondary | ICD-10-CM

## 2022-02-09 DIAGNOSIS — J9622 Acute and chronic respiratory failure with hypercapnia: Secondary | ICD-10-CM | POA: Diagnosis not present

## 2022-02-09 DIAGNOSIS — R7301 Impaired fasting glucose: Secondary | ICD-10-CM

## 2022-02-09 DIAGNOSIS — I10 Essential (primary) hypertension: Secondary | ICD-10-CM | POA: Diagnosis not present

## 2022-02-09 DIAGNOSIS — F411 Generalized anxiety disorder: Secondary | ICD-10-CM

## 2022-02-09 DIAGNOSIS — I5032 Chronic diastolic (congestive) heart failure: Secondary | ICD-10-CM

## 2022-02-09 DIAGNOSIS — I5033 Acute on chronic diastolic (congestive) heart failure: Secondary | ICD-10-CM | POA: Diagnosis not present

## 2022-02-09 DIAGNOSIS — R600 Localized edema: Secondary | ICD-10-CM | POA: Diagnosis not present

## 2022-02-09 DIAGNOSIS — I169 Hypertensive crisis, unspecified: Secondary | ICD-10-CM | POA: Diagnosis not present

## 2022-02-09 DIAGNOSIS — E876 Hypokalemia: Secondary | ICD-10-CM | POA: Diagnosis not present

## 2022-02-09 DIAGNOSIS — F321 Major depressive disorder, single episode, moderate: Secondary | ICD-10-CM

## 2022-02-09 DIAGNOSIS — J9601 Acute respiratory failure with hypoxia: Secondary | ICD-10-CM | POA: Diagnosis not present

## 2022-02-09 DIAGNOSIS — J449 Chronic obstructive pulmonary disease, unspecified: Secondary | ICD-10-CM | POA: Diagnosis not present

## 2022-02-09 MED ORDER — SPIRONOLACTONE 25 MG PO TABS: 12.5000 mg | ORAL_TABLET | Freq: Every day | ORAL | 1 refills | Status: DC

## 2022-02-09 MED ORDER — DULOXETINE HCL 60 MG PO CPEP: ORAL_CAPSULE | ORAL | 5 refills | Status: DC

## 2022-02-09 MED ORDER — PRAVASTATIN SODIUM 40 MG PO TABS: ORAL_TABLET | ORAL | 1 refills | Status: DC

## 2022-02-09 MED ORDER — ALPRAZOLAM 0.25 MG PO TABS: 0.2500 mg | ORAL_TABLET | Freq: Two times a day (BID) | ORAL | 0 refills | Status: DC | PRN

## 2022-02-09 MED ORDER — TAMSULOSIN HCL 0.4 MG PO CAPS: 0.4000 mg | ORAL_CAPSULE | Freq: Every day | ORAL | 5 refills | Status: DC

## 2022-02-09 MED ORDER — HYDRALAZINE HCL 100 MG PO TABS: 100.0000 mg | ORAL_TABLET | Freq: Three times a day (TID) | ORAL | 5 refills | Status: DC

## 2022-02-09 MED ORDER — ISOSORBIDE MONONITRATE ER 30 MG PO TB24: 90.0000 mg | ORAL_TABLET | Freq: Every day | ORAL | 5 refills | Status: DC

## 2022-02-09 MED ORDER — AMLODIPINE BESYLATE 10 MG PO TABS: 10.0000 mg | ORAL_TABLET | Freq: Every day | ORAL | 5 refills | Status: DC

## 2022-02-09 MED ORDER — CARVEDILOL 12.5 MG PO TABS: 12.5000 mg | ORAL_TABLET | Freq: Two times a day (BID) | ORAL | 5 refills | Status: DC

## 2022-02-09 MED ORDER — TORSEMIDE 20 MG PO TABS: 20.0000 mg | ORAL_TABLET | Freq: Every day | ORAL | 3 refills | Status: DC

## 2022-02-09 NOTE — Patient Instructions (Addendum)
Hi [name]  So sorry that you had such a difficult time in the hospital  You are having a significant amount of swelling in your legs I would recommend that we stop furosemide We will use torsemide 20 mg tablet For the 3 days I would recommend taking 2 tablets each morning Then after that going down to just 1 tablet each morning  We will need to do some blood work today  I have also sent in a prescription for alprazolam that you can use on occasion no more than twice daily when necessary for severe anxiety but please try not to use it every day  We will be seeing you back in 4 weeks time  Please do RSV vaccine at the pharmacy  We will also work toward getting you set up with a nephrologist. We will also see if we can get home health to come check on you on a weekly basis for the next few weeks We will see you back here in 4 weeks [facility]-Dr. Nicki Reaper

## 2022-02-09 NOTE — Progress Notes (Signed)
Subjective:    Patient ID: [name], male    DOB: 11-01-1940, 82 y.o.   MRN: [id number]  HPI  Transition of care from Rehab facility to at home care by family and may need orders for home health care services out of [facility]  Evaluate for anxiety and depression - nerves on edge - needs some medication Stage 3a chronic kidney disease (CKD) ([place]) - Plan: Basic Metabolic Panel, Parathyroid hormone, intact (no Ca), Magnesium, Hepatic Function Panel, CBC with Differential, Ambulatory referral to Nephrology  Ulcerative colitis without complications, unspecified location [facility]), Chronic  Pulmonary emphysema, unspecified emphysema type ([place]), Chronic  Chronic respiratory failure with hypoxia ([name])  Depression, major, single episode, moderate ([name]), Chronic  Chronic diastolic heart failure ([place])  Morbid obesity ([place]), Chronic  Ankylosing spondylitis of multiple sites in spine ([place]), Chronic  Fasting hyperglycemia - Plan: Basic Metabolic Panel, Parathyroid hormone, intact (no Ca), Magnesium, Hepatic Function Panel, CBC with Differential  GAD (generalized anxiety disorder)  Hypercalcemia  Leukocytosis, unspecified type - Plan: Basic Metabolic Panel, Parathyroid hormone, intact (no Ca), Magnesium, Hepatic Function Panel, CBC with Differential, Ambulatory referral to Nephrology  Patient was recently in the hospital had a very difficult situation.  He had pneumonia respiratory failure COPD along with diastolic failure pedal edema renal insufficiency chronic kidney disease Patient also with fatigue tiredness depression.  Elevated glucose. Review of Systems     Objective:   Physical Exam General-in no acute distress Eyes-no discharge Lungs-respiratory rate normal, CTA CV-no murmurs,RRR Extremities skin warm dry no edema Neuro grossly normal Behavior normal, alert     Assessment & Plan:  1. Ulcerative colitis without complications, unspecified location [facility]) Has  underlying history of ulcerative colitis stable recently.  Follows with gastroenterology regular basis   2. Pulmonary emphysema, unspecified emphysema type ([place]) Has COPD takes his medicine on a regular basis also has hypoxia with this  3. Chronic respiratory failure with hypoxia ([name]) Patient is on chronic oxygen.  Also on respiratory meds  4. Depression, major, single episode, moderate ([place]) He is on antidepressant but he suffers with severe anxiety and stress associated with his health issues we did discuss how utilizing low-dose Xanax on a occasional basis would be considerable potential help as long as he does not overuse that his family will help make sure he only uses it occasionally  5. Chronic diastolic heart failure ([place]) He has significant pedal edema we will switch from furosemide to torsemide we will try to get home health he is homebound he would benefit from a home health to help oversee his medicines and his current condition we will see him back within 3 months  6. Stage 3a chronic kidney disease (CKD) ([place]) Referral to nephrology for further evaluation - Basic Metabolic Panel - Parathyroid hormone, intact (no Ca) - Magnesium - Hepatic Function Panel - CBC with Differential - Ambulatory referral to Nephrology  7. Morbid obesity ([place]) Portion control regular activity very difficult to lose weight because of his current condition  8. Ankylosing spondylitis of multiple sites in spine [facility]) Patient has an appropriate walker but he is a fall risk to stay active  9. Fasting hyperglycemia Check lab work await results.  May need further workup - Basic Metabolic Panel - Parathyroid hormone, intact (no Ca) - Magnesium - Hepatic Function Panel - CBC with Differential  10. GAD (generalized anxiety disorder) Alprazolam low-dose infrequently  11. Hypercalcemia Check lab work await results  12. Leukocytosis, unspecified type Check CBC - Basic Metabolic  Panel -  Parathyroid hormone, intact (no Ca) - Magnesium - Hepatic Function Panel - CBC with Differential - Ambulatory referral to Nephrology  Very difficult situation Complex patient Follow through with cardiology See nephrology Home health consult Follow-up 4 weeks

## 2022-02-10 LAB — PARATHYROID HORMONE, INTACT (NO CA): PTH: 69 pg/mL — ABNORMAL HIGH (ref 15–65)

## 2022-02-10 LAB — CBC WITH DIFFERENTIAL/PLATELET
Basophils Absolute: 0.1 10*3/uL (ref 0.0–0.2)
Basos: 1 %
EOS (ABSOLUTE): 0.4 10*3/uL (ref 0.0–0.4)
Eos: 4 %
Hematocrit: 39.7 % (ref 37.5–51.0)
Hemoglobin: 13.4 g/dL (ref 13.0–17.7)
Immature Grans (Abs): 0 10*3/uL (ref 0.0–0.1)
Immature Granulocytes: 0 %
Lymphocytes Absolute: 3.5 10*3/uL — ABNORMAL HIGH (ref 0.7–3.1)
Lymphs: 35 %
MCH: 30.7 pg (ref 26.6–33.0)
MCHC: 33.8 g/dL (ref 31.5–35.7)
MCV: 91 fL (ref 79–97)
Monocytes Absolute: 0.9 10*3/uL (ref 0.1–0.9)
Monocytes: 9 %
Neutrophils Absolute: 5 10*3/uL (ref 1.4–7.0)
Neutrophils: 51 %
Platelets: 315 10*3/uL (ref 150–450)
RBC: 4.37 x10E6/uL (ref 4.14–5.80)
RDW: 13 % (ref 11.6–15.4)
WBC: 10 10*3/uL (ref 3.4–10.8)

## 2022-02-10 LAB — HEPATIC FUNCTION PANEL
ALT: 32 IU/L (ref 0–44)
AST: 35 IU/L (ref 0–40)
Albumin: 3.8 g/dL (ref 3.7–4.7)
Alkaline Phosphatase: 101 IU/L (ref 44–121)
Bilirubin Total: 0.3 mg/dL (ref 0.0–1.2)
Bilirubin, Direct: 0.14 mg/dL (ref 0.00–0.40)
Total Protein: 7.1 g/dL (ref 6.0–8.5)

## 2022-02-10 LAB — BASIC METABOLIC PANEL
BUN/Creatinine Ratio: 18 (ref 10–24)
BUN: 27 mg/dL (ref 8–27)
CO2: 26 mmol/L (ref 20–29)
Calcium: 10.7 mg/dL — ABNORMAL HIGH (ref 8.6–10.2)
Chloride: 104 mmol/L (ref 96–106)
Creatinine, Ser: 1.47 mg/dL — ABNORMAL HIGH (ref 0.76–1.27)
Glucose: 114 mg/dL — ABNORMAL HIGH (ref 70–99)
Potassium: 4.9 mmol/L (ref 3.5–5.2)
Sodium: 143 mmol/L (ref 134–144)
eGFR: 47 mL/min/{1.73_m2} — ABNORMAL LOW (ref >59)

## 2022-02-10 LAB — MAGNESIUM: Magnesium: 2 mg/dL (ref 1.6–2.3)

## 2022-02-12 ENCOUNTER — Other Ambulatory Visit: Payer: Self-pay

## 2022-02-12 DIAGNOSIS — I5032 Chronic diastolic (congestive) heart failure: Secondary | ICD-10-CM

## 2022-02-12 DIAGNOSIS — J441 Chronic obstructive pulmonary disease with (acute) exacerbation: Secondary | ICD-10-CM

## 2022-02-12 NOTE — Addendum Note (Signed)
Addended by: Dairl Ponder on: 02/12/2022 02:04 PM   Modules accepted: Orders

## 2022-02-12 NOTE — Progress Notes (Signed)
Please set up home health for this patient he is homebound.  He has diastolic heart failure along with COPD with hypoxia he is very fragile could end up back in the hospital needs home health to help monitor  --Home health orders placed per drs notes--

## 2022-02-13 ENCOUNTER — Other Ambulatory Visit: Payer: Self-pay

## 2022-02-13 ENCOUNTER — Emergency Department (HOSPITAL_COMMUNITY)
Admission: EM | Admit: 2022-02-13 | Discharge: 2022-02-14 | Disposition: A | Discharge status: Home / Self Care | Payer: Medicare Other | Attending: Emergency Medicine | Admitting: Emergency Medicine

## 2022-02-13 ENCOUNTER — Emergency Department (HOSPITAL_COMMUNITY): Payer: Medicare Other

## 2022-02-13 ENCOUNTER — Telehealth: Payer: Self-pay | Admitting: *Deleted

## 2022-02-13 ENCOUNTER — Encounter (HOSPITAL_COMMUNITY): Payer: Self-pay

## 2022-02-13 DIAGNOSIS — Z79899 Other long term (current) drug therapy: Secondary | ICD-10-CM | POA: Insufficient documentation

## 2022-02-13 DIAGNOSIS — R55 Syncope and collapse: Secondary | ICD-10-CM | POA: Insufficient documentation

## 2022-02-13 DIAGNOSIS — R531 Weakness: Secondary | ICD-10-CM | POA: Diagnosis not present

## 2022-02-13 DIAGNOSIS — I10 Essential (primary) hypertension: Secondary | ICD-10-CM | POA: Insufficient documentation

## 2022-02-13 DIAGNOSIS — Z20822 Contact with and (suspected) exposure to covid-19: Secondary | ICD-10-CM | POA: Insufficient documentation

## 2022-02-13 DIAGNOSIS — D649 Anemia, unspecified: Secondary | ICD-10-CM | POA: Diagnosis not present

## 2022-02-13 DIAGNOSIS — E860 Dehydration: Secondary | ICD-10-CM | POA: Diagnosis not present

## 2022-02-13 DIAGNOSIS — I959 Hypotension, unspecified: Secondary | ICD-10-CM | POA: Diagnosis not present

## 2022-02-13 DIAGNOSIS — R001 Bradycardia, unspecified: Secondary | ICD-10-CM | POA: Diagnosis not present

## 2022-02-13 DIAGNOSIS — J811 Chronic pulmonary edema: Secondary | ICD-10-CM | POA: Diagnosis not present

## 2022-02-13 DIAGNOSIS — I4430 Unspecified atrioventricular block: Secondary | ICD-10-CM | POA: Diagnosis not present

## 2022-02-13 DIAGNOSIS — J449 Chronic obstructive pulmonary disease, unspecified: Secondary | ICD-10-CM | POA: Insufficient documentation

## 2022-02-13 DIAGNOSIS — R627 Adult failure to thrive: Secondary | ICD-10-CM | POA: Diagnosis not present

## 2022-02-13 DIAGNOSIS — R0902 Hypoxemia: Secondary | ICD-10-CM | POA: Diagnosis not present

## 2022-02-13 DIAGNOSIS — I2510 Atherosclerotic heart disease of native coronary artery without angina pectoris: Secondary | ICD-10-CM | POA: Diagnosis not present

## 2022-02-13 LAB — CBC WITH DIFFERENTIAL/PLATELET
Abs Immature Granulocytes: 0.03 10*3/uL (ref 0.00–0.07)
Basophils Absolute: 0 10*3/uL (ref 0.0–0.1)
Basophils Relative: 1 %
Eosinophils Absolute: 0.1 10*3/uL (ref 0.0–0.5)
Eosinophils Relative: 1 %
HCT: 36.3 % — ABNORMAL LOW (ref 39.0–52.0)
Hemoglobin: 11.4 g/dL — ABNORMAL LOW (ref 13.0–17.0)
Immature Granulocytes: 1 %
Lymphocytes Relative: 37 %
Lymphs Abs: 2.2 10*3/uL (ref 0.7–4.0)
MCH: 30.1 pg (ref 26.0–34.0)
MCHC: 31.4 g/dL (ref 30.0–36.0)
MCV: 95.8 fL (ref 80.0–100.0)
Monocytes Absolute: 0.9 10*3/uL (ref 0.1–1.0)
Monocytes Relative: 15 %
Neutro Abs: 2.7 10*3/uL (ref 1.7–7.7)
Neutrophils Relative %: 45 %
Platelets: 141 10*3/uL — ABNORMAL LOW (ref 150–400)
RBC: 3.79 MIL/uL — ABNORMAL LOW (ref 4.22–5.81)
RDW: 14.1 % (ref 11.5–15.5)
WBC: 5.9 10*3/uL (ref 4.0–10.5)
nRBC: 0 % (ref 0.0–0.2)

## 2022-02-13 LAB — COMPREHENSIVE METABOLIC PANEL
ALT: 53 U/L — ABNORMAL HIGH (ref 0–44)
AST: 78 U/L — ABNORMAL HIGH (ref 15–41)
Albumin: 2.9 g/dL — ABNORMAL LOW (ref 3.5–5.0)
Alkaline Phosphatase: 70 U/L (ref 38–126)
Anion gap: 8 (ref 5–15)
BUN: 34 mg/dL — ABNORMAL HIGH (ref 8–23)
CO2: 27 mmol/L (ref 22–32)
Calcium: 9.1 mg/dL (ref 8.9–10.3)
Chloride: 99 mmol/L (ref 98–111)
Creatinine, Ser: 1.65 mg/dL — ABNORMAL HIGH (ref 0.61–1.24)
GFR, Estimated: 41 mL/min — ABNORMAL LOW (ref >60)
Glucose, Bld: 104 mg/dL — ABNORMAL HIGH (ref 70–99)
Potassium: 4.1 mmol/L (ref 3.5–5.1)
Sodium: 134 mmol/L — ABNORMAL LOW (ref 135–145)
Total Bilirubin: 0.5 mg/dL (ref 0.3–1.2)
Total Protein: 6.6 g/dL (ref 6.5–8.1)

## 2022-02-13 LAB — URINALYSIS, ROUTINE W REFLEX MICROSCOPIC
Bacteria, UA: NONE SEEN
Bilirubin Urine: NEGATIVE
Glucose, UA: NEGATIVE mg/dL
Hgb urine dipstick: NEGATIVE
Ketones, ur: NEGATIVE mg/dL
Leukocytes,Ua: NEGATIVE
Nitrite: NEGATIVE
Protein, ur: 30 mg/dL — AB
Specific Gravity, Urine: 1.018 (ref 1.005–1.030)
pH: 5 (ref 5.0–8.0)

## 2022-02-13 LAB — RESP PANEL BY RT-PCR (RSV, FLU A&B, COVID)  RVPGX2
Influenza A by PCR: NEGATIVE
Influenza B by PCR: NEGATIVE
Resp Syncytial Virus by PCR: NEGATIVE
SARS Coronavirus 2 by RT PCR: NEGATIVE

## 2022-02-13 MED ORDER — ALBUTEROL SULFATE HFA 108 (90 BASE) MCG/ACT IN AERS: 2.0000 | INHALATION_SPRAY | Freq: Four times a day (QID) | RESPIRATORY_TRACT | Status: DC | PRN

## 2022-02-13 MED ORDER — CELECOXIB 100 MG PO CAPS
100.0000 mg | ORAL_CAPSULE | Freq: Every day | ORAL | Status: DC
  Administered 2022-02-14: 100 mg via ORAL
  Filled 2022-02-13: qty 1

## 2022-02-13 MED ORDER — TORSEMIDE 20 MG PO TABS
20.0000 mg | ORAL_TABLET | Freq: Every day | ORAL | Status: DC
  Administered 2022-02-14: 20 mg via ORAL
  Filled 2022-02-13: qty 1

## 2022-02-13 MED ORDER — SODIUM CHLORIDE 0.9 % IV SOLN: INTRAVENOUS | Status: DC

## 2022-02-13 MED ORDER — DULOXETINE HCL 30 MG PO CPEP
60.0000 mg | ORAL_CAPSULE | Freq: Every day | ORAL | Status: DC
  Administered 2022-02-14: 60 mg via ORAL
  Filled 2022-02-13: qty 2

## 2022-02-13 MED ORDER — ISOSORBIDE MONONITRATE ER 60 MG PO TB24
90.0000 mg | ORAL_TABLET | Freq: Every day | ORAL | Status: DC
  Administered 2022-02-14: 90 mg via ORAL
  Filled 2022-02-13: qty 1

## 2022-02-13 MED ORDER — TAMSULOSIN HCL 0.4 MG PO CAPS
0.4000 mg | ORAL_CAPSULE | Freq: Every day | ORAL | Status: DC
  Administered 2022-02-14: 0.4 mg via ORAL
  Filled 2022-02-13: qty 1

## 2022-02-13 MED ORDER — PRAVASTATIN SODIUM 40 MG PO TABS
40.0000 mg | ORAL_TABLET | Freq: Every day | ORAL | Status: DC
  Administered 2022-02-14: 40 mg via ORAL
  Filled 2022-02-13: qty 1

## 2022-02-13 MED ORDER — AMLODIPINE BESYLATE 5 MG PO TABS
10.0000 mg | ORAL_TABLET | Freq: Every day | ORAL | Status: DC
  Administered 2022-02-14: 10 mg via ORAL
  Filled 2022-02-13: qty 2

## 2022-02-13 MED ORDER — CARVEDILOL 12.5 MG PO TABS
12.5000 mg | ORAL_TABLET | Freq: Two times a day (BID) | ORAL | Status: DC
  Administered 2022-02-14 (×2): 12.5 mg via ORAL
  Filled 2022-02-13 (×2): qty 1

## 2022-02-13 MED ORDER — HYDRALAZINE HCL 25 MG PO TABS
100.0000 mg | ORAL_TABLET | Freq: Three times a day (TID) | ORAL | Status: DC
  Administered 2022-02-14: 100 mg via ORAL
  Filled 2022-02-13 (×2): qty 4

## 2022-02-13 MED ORDER — SPIRONOLACTONE 12.5 MG HALF TABLET
12.5000 mg | ORAL_TABLET | Freq: Every day | ORAL | Status: DC
  Administered 2022-02-14: 12.5 mg via ORAL
  Filled 2022-02-13: qty 1

## 2022-02-13 NOTE — ED Provider Notes (Signed)
[facility] EMERGENCY DEPARTMENT Provider Note   CSN: [id number] Arrival date & time: 02/13/22  1604     History {Add pertinent medical, surgical, social history, OB history to HPI:1} Chief Complaint  Patient presents with   Near Syncope   Weakness    [name] is a 82 y.o. male.  HPI     Home Medications Prior to Admission medications   Medication Sig Start Date End Date Taking? Authorizing Provider  acetaminophen (TYLENOL) 500 MG tablet Take 2 tablets (1,000 mg total) by mouth every 6 (six) hours as needed for headache (pain). 11/24/20   [name], MD  albuterol (VENTOLIN HFA) 108 (90 Base) MCG/ACT inhaler Inhale 2 puffs into the lungs every 6 (six) hours as needed for wheezing or shortness of breath. 02/20/21   [name], MD  ALPRAZolam Duanne Moron) 0.25 MG tablet Take 1 tablet (0.25 mg total) by mouth 2 (two) times daily as needed for anxiety. 02/09/22   [name], MD  amLODipine (NORVASC) 10 MG tablet Take 1 tablet (10 mg total) by mouth daily. 02/09/22   [name], MD  aspirin EC 81 MG tablet Take 81 mg by mouth daily. Swallow whole.    [provider]  carvedilol (COREG) 12.5 MG tablet Take 1 tablet (12.5 mg total) by mouth 2 (two) times daily with a meal. 02/09/22   Luking, Elayne Snare, MD  DULoxetine (CYMBALTA) 60 MG capsule TAKE 1 CAPSULE BY MOUTH DAILY 02/09/22   [name], MD  hydrALAZINE (APRESOLINE) 100 MG tablet Take 1 tablet (100 mg total) by mouth every 8 (eight) hours. 02/09/22   [name], MD  isosorbide mononitrate (IMDUR) 30 MG 24 hr tablet Take 3 tablets (90 mg total) by mouth daily. 02/09/22   [name], MD  ketoconazole (NIZORAL) 2 % cream APPLY TOPICALLY TWICE DAILY. [place] WITH BARRIER CREAM SUCH AS [place] Patient taking differently: Apply 1 Application topically daily as needed for irritation. MIX WITH BARRIER CREAM SUCH AS DESITIN 07/11/21   [name], MD  mupirocin ointment (BACTROBAN) 2 % Apply 1 Application  topically 2 (two) times daily as needed (wound care/raw areas). 09/20/21   [name], MD  nitroGLYCERIN (NITROSTAT) 0.4 MG SL tablet ONE TABLET UNDER TONGUE AS NEEDED FOR CHEST PAIN EVERY 5 MINUTES AS DIRECTED Patient taking differently: Place 0.4 mg under the tongue every 5 (five) minutes as needed for chest pain. 10/14/20   Lorretta Harp, MD  nystatin cream (MYCOSTATIN) Apply 1 application topically 2 (two) times daily as needed (rash/itching). 12/28/20   [name], MD  polyethylene glycol (MIRALAX / GLYCOLAX) 17 g packet Take 17 g by mouth daily as needed for moderate constipation. 01/26/22   Domenic Polite, MD  pravastatin (PRAVACHOL) 40 MG tablet 1 qd 02/09/22   [name], MD  pyridOXINE (VITAMIN B-6) 100 MG tablet Take 100 mg by mouth daily.    [provider]  spironolactone (ALDACTONE) 25 MG tablet Take 0.5 tablets (12.5 mg total) by mouth daily. 02/09/22   [name], MD  tamsulosin (FLOMAX) 0.4 MG CAPS capsule Take 1 capsule (0.4 mg total) by mouth daily. 02/09/22   [name], MD  torsemide (DEMADEX) 20 MG tablet Take 1 tablet (20 mg total) by mouth daily. 02/09/22   [name], MD  triamcinolone cream (KENALOG) 0.1 % Apply 1 Application topically 2 (two) times daily. Patient taking differently: Apply 1 Application topically daily as needed (  for itching). 09/20/21   [name], MD  vitamin C (ASCORBIC ACID) 500 MG tablet Take 500 mg by mouth daily.    [provider]  Vitamin D, Ergocalciferol, (DRISDOL) 1.25 MG (50000 UNIT) CAPS capsule TAKE 1 CAPSULE BY MOUTH EVERY 7 DAYS Patient taking differently: Take 50,000 Units by mouth every 7 (seven) days. Monday 01/02/21   [name], MD      Allergies    Patient has no known allergies.    Review of Systems   Review of Systems  Physical Exam Updated Vital Signs There were no vitals taken for this visit. Physical Exam  ED Results / Procedures / Treatments   Labs (all labs ordered  are listed, but only abnormal results are displayed) Labs Reviewed - No data to display  EKG None  Radiology No results found.  Procedures Procedures  {Document cardiac monitor, telemetry assessment procedure when appropriate:1}  Medications Ordered in ED Medications - No data to display  ED Course/ Medical Decision Making/ A&P                           Medical Decision Making  ***  {Document critical care time when appropriate:1} {Document review of labs and clinical decision tools ie heart score, Chads2Vasc2 etc:1}  {Document your independent review of radiology images, and any outside records:1} {Document your discussion with family members, caretakers, and with consultants:1} {Document social determinants of health affecting pt's care:1} {Document your decision making why or why not admission, treatments were needed:1} Final Clinical Impression(s) / ED Diagnoses Final diagnoses:  None    Rx / DC Orders ED Discharge Orders     None

## 2022-02-13 NOTE — Telephone Encounter (Signed)
Daughter [name] called and stated that other than the Imdur and ASA can all the rest of his meds and vitamins be crushed to take

## 2022-02-13 NOTE — ED Notes (Signed)
AC called to bring flexiseal

## 2022-02-13 NOTE — ED Triage Notes (Addendum)
Pt BIB [facility] from home c/o weakness increased over the last few days. Pt A&0 X 4. Denies pain. NAD. Daughter has confirmed covid.   Pt needed full bed bath upon arrival due to feces.  Wears 4 L 02 [place] chronically

## 2022-02-13 NOTE — ED Notes (Signed)
Changed pt again due to loose BM. Pt is cleaned and resting at this time.

## 2022-02-13 NOTE — ED Notes (Signed)
Changed and cleaned pt due to a loose BM. Pts whole bed was changed and placed on a clean bed and brief. New malewick was placed and pt is resting at this time.

## 2022-02-14 NOTE — ED Notes (Signed)
Pt received dinner, able to feed self.

## 2022-02-14 NOTE — ED Notes (Signed)
Checked on pt due to low O2 sat. O2 hose came detached from CPAP machine. O2 reattached and O2 sats now at 90%

## 2022-02-14 NOTE — ED Notes (Signed)
Per SW, daughter will pick pt up after 5:30, he will stay with [name] until [name] is better from [place], SW asked pt be updated, pt aware

## 2022-02-14 NOTE — Telephone Encounter (Signed)
Oh my-does sound like a good idea he tends to have pretty fragile health currently

## 2022-02-14 NOTE — ED Provider Notes (Signed)
  Physical Exam  BP (!) 115/51   Pulse (!) 51   Temp (!) 97.5 F (36.4 C) (Oral)   Resp 15   Ht '6\' 1"'$  (1.854 m)   Wt (!) 140.6 kg   SpO2 95%   BMI 40.90 kg/m   Physical Exam  Procedures  Procedures  ED Course / MDM    Medical Decision Making Amount and/or Complexity of Data Reviewed Labs: ordered. Radiology: ordered.  Risk Prescription drug management.     patient had PT eval and recommended home health.  Has home health set up for home.  Family to pick up patient.  Some mild dehydration but will discharge.      Davonna Belling, MD 02/14/22 [phone]

## 2022-02-14 NOTE — ED Notes (Addendum)
TOC to follow for PT recommendations. PT eval pending at this time. TOC to follow.   Addendum 3pm: CSW left VM for pts daughter requesting return call. TOC to follow.

## 2022-02-14 NOTE — Telephone Encounter (Signed)
Daughter stated that patient is currently at [facility] ER because he could not stand or get up today and they are trying to get him placed in a rehab facility short term till he gets his strength back and then they want to try to get him back home

## 2022-02-14 NOTE — ED Notes (Signed)
Pt prefers to have head covered up and completely dark while sleeping.

## 2022-02-14 NOTE — ED Notes (Signed)
Rectal tube came out of place.  Patient changed, cleaned and rectal tube replaced by RN.  Urinal at bedside, per pt request, pt refused male pure wick.  Pt verbalized understanding of calling out when he needs to use the urinal.

## 2022-02-14 NOTE — ED Notes (Signed)
RT called due to pt's O2 keeps dropping while on cpap. Pt's mouth is slipping out of it. RT to come and assess

## 2022-02-14 NOTE — Evaluation (Signed)
Physical Therapy Evaluation Patient Details Name: [name] MRN: [id number] DOB: 1940-05-26 Today's Date: 02/14/2022  History of Present Illness  [name] is a 82 y.o. male.     Patient brought in by EMS.  From home.  Patient had a brief period of being unresponsive.  Has been having difficulty with loose bowel movements.  Patient has been in rehab.  Is only been back home for a few days.  Family is not able to really care for him.  Here patient is alert may be slight confusion but that is baseline for him but following commands.     Past medical history significant for hypertension hyperlipidemia obstructive sleep apnea on CPAP coronary artery disease COPD.  Patient is normally on oxygen at home.  Normally on 4 L or more.  In the does CPAP at night.   Clinical Impression  Patient demonstrates fair/good return for sitting at bedside with slightly labored movement, tolerated ambulating in hallway without loss of balance while on 4 LPM with SpO2 dropping from 96% to 85%, recovered to 100% after sitting at bedside and demonstrated good return for getting back into bed.  Patient will benefit from continued skilled physical therapy in hospital and recommended venue below to increase strength, balance, endurance for safe ADLs and gait.        Recommendations for follow up therapy are one component of a multi-disciplinary discharge planning process, led by the attending physician.  Recommendations may be updated based on patient status, additional functional criteria and insurance authorization.  Follow Up Recommendations Home health PT Can patient physically be transported by private vehicle: Yes    Assistance Recommended at Discharge Set up Supervision/Assistance  Patient can return home with the following  A little help with walking and/or transfers;A little help with bathing/dressing/bathroom;Assistance with cooking/housework;Help with stairs or ramp for entrance    Equipment  Recommendations None recommended by PT  Recommendations for Other Services       Functional Status Assessment Patient has had a recent decline in their functional status and demonstrates the ability to make significant improvements in function in a reasonable and predictable amount of time.     Precautions / Restrictions Precautions Precautions: Fall Restrictions Weight Bearing Restrictions: No      Mobility  Bed Mobility Overal bed mobility: Needs Assistance Bed Mobility: Supine to Sit, Sit to Supine     Supine to sit: Min assist Sit to supine: Supervision   General bed mobility comments: increased time, labored movement    Transfers Overall transfer level: Needs assistance Equipment used: Rolling walker (2 wheels) Transfers: Sit to/from Stand, Bed to chair/wheelchair/BSC Sit to Stand: Supervision   Step pivot transfers: Supervision       General transfer comment: slightly labored movement    Ambulation/Gait Ambulation/Gait assistance: Supervision Gait Distance (Feet): 45 Feet Assistive device: Rolling walker (2 wheels) Gait Pattern/deviations: Decreased step length - right, Decreased step length - left, Decreased stride length Gait velocity: decreased     General Gait Details: slow slightly labored cadence without loss of balance, limited mostly due to fatigue, on 4 LPM with SpO2 dropping from 96% to 85%  Stairs            Wheelchair Mobility    Modified Rankin (Stroke Patients Only)       Balance Overall balance assessment: Needs assistance Sitting-balance support: Feet unsupported, No upper extremity supported Sitting balance-Leahy Scale: Good Sitting balance - Comments: seated at EOB   Standing balance support: During  functional activity, Bilateral upper extremity supported Standing balance-Leahy Scale: Fair Standing balance comment: fair/good using RW                             Pertinent Vitals/Pain Pain Assessment Pain  Assessment: No/denies pain    Home Living Family/patient expects to be discharged to:: Private residence Living Arrangements: Children   Type of Home: House Home Access: Stairs to enter Entrance Stairs-Rails: Left Entrance Stairs-Number of Steps: 1   Home Layout: One level Home Equipment: Shower seat - built Medical sales representative (2 wheels);Rollator (4 wheels) (adjustable bed, lift chair) Additional Comments: PCA 4 hours a day 6 days a week    Prior Function Prior Level of Function : Needs assist             Mobility Comments: Uses the rollator in the house ADLs Comments: assisted with showers, dressing by home aides     Hand Dominance   Dominant Hand: Right    Extremity/Trunk Assessment   Upper Extremity Assessment Upper Extremity Assessment: Overall WFL for tasks assessed    Lower Extremity Assessment Lower Extremity Assessment: Generalized weakness    Cervical / Trunk Assessment Cervical / Trunk Assessment: Normal  Communication   Communication: No difficulties  Cognition Arousal/Alertness: Awake/alert Behavior During Therapy: WFL for tasks assessed/performed Overall Cognitive Status: Within Functional Limits for tasks assessed                                          General Comments      Exercises     Assessment/Plan    PT Assessment Patient needs continued PT services  PT Problem List Decreased strength;Decreased activity tolerance;Decreased balance;Decreased mobility       PT Treatment Interventions DME instruction;Gait training;Stair training;Functional mobility training;Therapeutic activities;Therapeutic exercise;Balance training;Patient/family education    PT Goals (Current goals can be found in the Care Plan section)  Acute Rehab PT Goals Patient Stated Goal: return home with family and home aides to assist PT Goal Formulation: With patient Time For Goal Achievement: 02/16/22    Frequency Min 2X/week     Co-evaluation                AM-PAC PT "6 Clicks" Mobility  Outcome Measure Help needed turning from your back to your side while in a flat bed without using bedrails?: A Little Help needed moving from lying on your back to sitting on the side of a flat bed without using bedrails?: A Little Help needed moving to and from a bed to a chair (including a wheelchair)?: A Little Help needed standing up from a chair using your arms (e.g., wheelchair or bedside chair)?: A Little Help needed to walk in hospital room?: A Little Help needed climbing 3-5 steps with a railing? : A Lot 6 Click Score: 17    End of Session Equipment Utilized During Treatment: Oxygen Activity Tolerance: Patient tolerated treatment well;Patient limited by fatigue Patient left: in bed;with call bell/phone within reach Nurse Communication: Mobility status PT Visit Diagnosis: Unsteadiness on feet (R26.81);Other abnormalities of gait and mobility (R26.89);Muscle weakness (generalized) (M62.81)    Time: 1450-1520 PT Time Calculation (min) (ACUTE ONLY): 30 min   Charges:   PT Evaluation $PT Eval Moderate Complexity: 1 Mod PT Treatments $Therapeutic Activity: 23-37 mins        4:30 PM, 02/14/22 Lonell Grandchild,  MPT Physical Therapist with [facility] [phone] office [phone] mobile phone

## 2022-02-14 NOTE — ED Notes (Signed)
CSW spoke to pts daughter [name], she states pt will be staying with her while her sister has COVID and is recovering. She is agreeable to [facility] PT as this is recommendation from PT. CSW updated Marjory Lies with [facility] as pt was recently set up with their agency. Marjory Lies states pt does not need new orders. CSW provided Marjory Lies with [facility] number and address. CSW updated MD and RN of plan. [name] will be here to get pt from ED after 5:30pm tonight. TOC signing off.

## 2022-02-14 NOTE — ED Provider Notes (Signed)
Emergency Medicine Observation Re-evaluation Note  [name] is a 82 y.o. male, seen on rounds today.  Pt initially presented to the ED for complaints of Near Syncope and Weakness Currently, the patient is awake and alert.  Pt was seen in the ED yesterday because his daughter was unable to care for him at home.  He was admitted on 12/12 for CHF and was d/c to rehab.  He has just come home.  Physical Exam  BP (!) 137/59   Pulse 66   Temp 97.9 F (36.6 C)   Resp 20   Ht '6\' 1"'$  (1.854 m)   Wt (!) 140.6 kg   SpO2 94%   BMI 40.90 kg/m  Physical Exam General: awake and alert Cardiac: rr Lungs: clear Psych: calm  ED Course / MDM  EKG:EKG Interpretation  Date/Time:  Tuesday February 13 2022 22:53:29 EST Ventricular Rate:  55 PR Interval:  268 QRS Duration: 110 QT Interval:  406 QTC Calculation: 389 R Axis:   -13 Text Interpretation: Sinus rhythm Prolonged PR interval Inferior infarct, old Lateral leads are also involved Confirmed by Fredia Sorrow [phone]) on 02/13/2022 11:04:31 PM  I have reviewed the labs performed to date as well as medications administered while in observation.  Recent changes in the last 24 hours include none.  Plan  Current plan is for awaiting SW and PT eval.    Isla Pence, MD 02/14/22 [phone]

## 2022-02-14 NOTE — ED Notes (Signed)
Pt cleaned up, rectal tube still in place.

## 2022-02-14 NOTE — ED Notes (Signed)
Pt received his breakfast tray.  Pulled the pt up in bed and got his tray situated so he can eat.

## 2022-02-14 NOTE — Plan of Care (Signed)
  Problem: Acute Rehab PT Goals(only PT should resolve) Goal: Pt Will Go Supine/Side To Sit Outcome: Progressing Flowsheets (Taken 02/14/2022 1632) Pt will go Supine/Side to Sit: with modified independence Goal: Patient Will Transfer Sit To/From Stand Outcome: Progressing Flowsheets (Taken 02/14/2022 1632) Patient will transfer sit to/from stand:  with supervision  with modified independence Goal: Pt Will Transfer Bed To Chair/Chair To Bed Outcome: Progressing Flowsheets (Taken 02/14/2022 1632) Pt will Transfer Bed to Chair/Chair to Bed:  with modified independence  with supervision Goal: Pt Will Ambulate Outcome: Progressing Flowsheets (Taken 02/14/2022 1632) Pt will Ambulate:  50 feet  with supervision  with modified independence  with rolling walker   4:32 PM, 02/14/22 Lonell Grandchild, MPT Physical Therapist with [facility] [phone] office [phone] mobile phone

## 2022-02-14 NOTE — Telephone Encounter (Signed)
So noted if having ongoing troubles follow-up sooner otherwise has a follow-up visit started February

## 2022-02-16 ENCOUNTER — Other Ambulatory Visit: Payer: Self-pay | Admitting: *Deleted

## 2022-02-16 DIAGNOSIS — I5033 Acute on chronic diastolic (congestive) heart failure: Secondary | ICD-10-CM

## 2022-02-16 DIAGNOSIS — I2510 Atherosclerotic heart disease of native coronary artery without angina pectoris: Secondary | ICD-10-CM | POA: Diagnosis not present

## 2022-02-16 DIAGNOSIS — M6281 Muscle weakness (generalized): Secondary | ICD-10-CM | POA: Diagnosis not present

## 2022-02-16 DIAGNOSIS — I509 Heart failure, unspecified: Secondary | ICD-10-CM | POA: Diagnosis not present

## 2022-02-16 NOTE — Patient Outreach (Signed)
Verified in [facility] [name] discharged from [facility] skilled nursing facility on 02/09/22. Screening for potential [facility] care coordination services as benefit of insurance plan and Primary Care Provider.   Chart reviewed. Noted [name] has recently discharged from [facility] ED on 02/14/22. Has [place] home health. Lives with daughter.   Will make referral to [facility] care coordination team for care coordination.     Marthenia Rolling, MSN, RN,BSN [place] Acute Care Coordinator [phone] (Direct dial)

## 2022-02-19 ENCOUNTER — Ambulatory Visit: Payer: Medicare Other | Admitting: Psychologist

## 2022-02-19 DIAGNOSIS — R0902 Hypoxemia: Secondary | ICD-10-CM | POA: Diagnosis not present

## 2022-02-19 DIAGNOSIS — J9601 Acute respiratory failure with hypoxia: Secondary | ICD-10-CM | POA: Diagnosis not present

## 2022-02-19 DIAGNOSIS — J441 Chronic obstructive pulmonary disease with (acute) exacerbation: Secondary | ICD-10-CM | POA: Diagnosis not present

## 2022-02-20 ENCOUNTER — Encounter: Payer: Self-pay | Admitting: *Deleted

## 2022-02-20 ENCOUNTER — Telehealth: Payer: Self-pay | Admitting: *Deleted

## 2022-02-20 ENCOUNTER — Ambulatory Visit: Payer: Medicare Other | Admitting: *Deleted

## 2022-02-20 NOTE — Patient Outreach (Addendum)
Care Coordination   Initial Visit Note   02/20/2022  Name: [name] MRN: [id number] DOB: 1940/03/09  [name] is a 82 y.o. year old male who sees [name], [name], [name] for primary care. I spoke with patient's daughter, [name] by phone today.  What matters to the patients health and wellness today?  Receive Assistance Obtaining In-Home Care Services.    Goals Addressed             This Visit's Progress    Receive Assistance Obtaining In-Home Care Services.   On track    Care Coordination Interventions:   Assessed Social Determinant of Health Barriers. Discussed Plans for Ongoing Care Management Follow Up. Provided Tree surgeon Information for Care Management Team. Screened for Signs & Symptoms of Depression Related to Chronic Disease State, in Both Patient & Daughter.  PHQ2 & PHQ9 Depression Screen Completed & Results Reviewed.  Suicidal Ideation & Homicidal Ideation Assessed - None Present. Domestic Violence & Weapons in [place] - None Present.      Active Listening & Reflection Utilized.  Verbalization of Feelings Encouraged.  Emotional & Caregiver Support Provided. Caregiver Stress Acknowledged. Caregiver Resources Reviewed. Caregiver Support Groups Offered & Self-Enrollment Encouraged. Provided Crisis Support Information, Agencies & Resources. Problem Solving Interventions Identified. Task-Centered Solutions Implemented.  Solution-Focused Strategies Developed.  Psychoeducation for Mental Health Concerns Initiated. Reviewed Prescription Medications & Discussed Importance of Compliance. Quality of Sleep Assessed & Sleep Hygiene Techniques Promoted. Discussed Higher Level of Care Options ([place]) with Patient's Daughter & Encouraged Consideration. Verified No In-Home Care Services, [employer], Building control surveyor, Etc., Covered Under  Teaching laboratory technician.  Reviewed Conservation officer, nature Benefits with Patient's Daughter, through [employer] & Ensured Understanding through Teach Back. Verified Patient's Monthly Social Security Income Exceeded Poverty Guidelines for the [place] for [employer] 2023, Making Patient Ineligible for Medicaid. Encouraged Patient's Daughter to Assist Patient with Re-Applying for Medicaid, through [place] [phone]), Under [place] Medicaid Expansion Program Guidelines, Effective 01/05/2022. Verified No Long-Term Care Insurance Benefits, [employer], Plans, Etc.  Confirmed Patient, Nor His Deceased Wife, Were Veterans, Making Patient Ineligible for Aid & Attendance Benefits, Through [employer]. Please Review the Following List of Agencies, [place] with Daughter, Emailed on 02/20/2022:     ~ [place]          ~ [place]          ~ Adult Day Care Programs          ~ Senior Resources of [employer]          ~ [place]   ~ 2023 Medicaid Tips   ~ [employer]   ~ Instructions For How to Apply for [employer]   ~ [place] Instructions     ~ Occupational psychologist Providers Please Research scientist (medical), [place], From the List Provided. Please Contact CSW Directly (# [phone]), If You Have Questions or Need Assistance with Application Completion & Submission.        SDOH assessments and interventions completed:  Yes.  SDOH Interventions Today    Flowsheet Row Most Recent Value  SDOH Interventions   Food Insecurity Interventions Intervention Not Indicated  [Verified by Daughter, [name]  [place] Interventions Intervention Not Indicated  [Verified by Daughter, [name]   Transportation Interventions Intervention Not Indicated, Patient Resources (Friends/Family), Payor Benefit  [Verified by Daughter, [name]  Utilities Interventions Intervention Not Indicated  [Verified by Daughter, [name]  Alcohol Usage Interventions Intervention Not Indicated (Score <7)  [Verified by Daughter, [name]  Depression Interventions/Treatment  Referral to Psychiatry, Medication, Counseling  [Verified by Daughter, [name]  Financial Strain Interventions Intervention Not Indicated  [Verified by Daughter, [name]  Physical Activity Interventions Patient Refused  [Verified by Daughter, [name]  Stress Interventions Intervention Not Indicated  [Verified by Daughter, [name]  Social Connections Interventions Intervention Not Indicated  [Verified by Daughter, [name]     Care Coordination Interventions:  Yes, provided.   Follow up plan: Follow up call scheduled for 02/28/2022 at 8:30 am.  Encounter Outcome:  Pt. Visit Completed.   Nat Christen, BSW, MSW, LCSW  Licensed Education officer, environmental Health System  Mailing [place] [address], [place], [place] [address] Physical Address-[address], [place], [place] [address] Toll Free Main # [phone] Fax # [phone] Cell # [phone] [name]

## 2022-02-20 NOTE — Progress Notes (Signed)
  Care Coordination   Note   02/20/2022 Name: [name] MRN: [id number] DOB: 04-14-1940  [name] is a 82 y.o. year old male who sees Luking, Elayne Snare, MD for primary care. I reached out to [name] by phone today to offer care coordination services.  [name] was given information about Care Coordination services today including:   The Care Coordination services include support from the care team which includes your Nurse Coordinator, Clinical Social Worker, or Pharmacist.  The Care Coordination team is here to help remove barriers to the health concerns and goals most important to you. Care Coordination services are voluntary, and the patient may decline or stop services at any time by request to their care team member.   Care Coordination Consent Status: Patient agreed to services and verbal consent obtained.   Follow up plan:  Telephone appointment with care coordination team member scheduled for:  02/21/22  Encounter Outcome:  Pt. Scheduled  [place]  Direct Dial: [phone]

## 2022-02-20 NOTE — Patient Instructions (Signed)
Visit Information  Thank you for taking time to visit with me today. Please don't hesitate to contact me if I can be of assistance to you.   Following are the goals we discussed today:   Goals Addressed             This Visit's Progress    Receive Assistance Obtaining In-Home Care Services.   On track    Care Coordination Interventions:   Assessed Social Determinant of Health Barriers. Discussed Plans for Ongoing Care Management Follow Up. Provided Tree surgeon Information for Care Management Team. Screened for Signs & Symptoms of Depression Related to Chronic Disease State, in Both Patient & Daughter.  PHQ2 & PHQ9 Depression Screen Completed & Results Reviewed.  Suicidal Ideation & Homicidal Ideation Assessed - None Present. Domestic Violence & Weapons in [place] - None Present.      Active Listening & Reflection Utilized.  Verbalization of Feelings Encouraged.  Emotional & Caregiver Support Provided. Caregiver Stress Acknowledged. Caregiver Resources Reviewed. Caregiver Support Groups Offered & Self-Enrollment Encouraged. Provided Crisis Support Information, Agencies & Resources. Problem Solving Interventions Identified. Task-Centered Solutions Implemented.  Solution-Focused Strategies Developed.  Psychoeducation for Mental Health Concerns Initiated. Reviewed Prescription Medications & Discussed Importance of Compliance. Quality of Sleep Assessed & Sleep Hygiene Techniques Promoted. Discussed Higher Level of Care Options ([place]) with Patient's Daughter & Encouraged Consideration. Verified No In-Home Care Services, [employer], Building control surveyor, Etc., Covered Under Teaching laboratory technician.  Reviewed Conservation officer, nature Benefits with Patient's Daughter, through [employer] & Ensured Understanding through [employer]. Verified Patient's Monthly  Social Security Income Exceeded Poverty Guidelines for the [place] for [employer] 2023, Making Patient Ineligible for Medicaid. Encouraged Patient's Daughter to Assist Patient with Re-Applying for Medicaid, through [place] (# [id number].), Under [employer] Program Guidelines, Effective 01/05/2022. Verified No Long-Term Care Insurance Benefits, [employer], Plans, Etc.  Confirmed Patient, Nor His Deceased Wife, Were Veterans, Making Patient Ineligible for Aid & Attendance Benefits, Through [employer]. Please Review the Following List of Agencies, [place] with Daughter, Emailed on 02/20/2022: ~ [place]          ~ [place]          ~ Adult Day Care Programs          ~ Senior Resources of [employer]          ~ [place]   ~ 2023 Medicaid Tips   ~ [employer]   ~ Instructions For How to Apply for [employer]   ~ [place] Instructions     ~ Occupational psychologist Providers Please Research scientist (medical), [place], From the List Provided. Please Contact CSW Directly (# [phone]), If You Have Questions or Need Assistance with Application Completion & Submission.      Our next appointment is by telephone on 02/28/2022 at 8:30 am.  Please call the care guide team at [phone] if you need to cancel or reschedule your appointment.   If you are experiencing a Mental Health or [place] or need someone to talk to, please call the Suicide and Crisis Lifeline: 988 call the [place] National Suicide Prevention Lifeline: (830) 860-6041 or TTY: 512-151-0053 TTY 623-873-9737) to talk to  a trained counselor call 1-800-273-TALK (toll free, 24 hour hotline) go to [facility]  Urgent [facility] [place] [address] [place] [phone]) call the [place]: [phone] call 911  Patient verbalizes understanding of instructions and care plan provided today and agrees to view in [place]. Active MyChart status and patient understanding of how to access instructions and care plan via MyChart confirmed with patient.     Telephone follow up appointment with care management team member scheduled for:  02/28/2022 at 8:30 am.    Nat Christen, BSW, MSW, [place]  Licensed Clinical Social Worker  [place]  Mailing [place]. [address], [place], [place] [address] Physical Address-[address] [place], [place] [address] Toll Free Main # [phone] Fax # [phone] Cell # [phone] [name]

## 2022-02-21 ENCOUNTER — Ambulatory Visit: Payer: Self-pay | Admitting: *Deleted

## 2022-02-21 NOTE — Patient Instructions (Signed)
Visit Information  Thank you for taking time to visit with me today. Please don't hesitate to contact me if I can be of assistance to you before our next scheduled telephone appointment.  Following are the goals we discussed today:  Monitor weight and blood pressure daily. Listen for call from [facility] for PT/OT/Nursing Remember PCP appointment for 2/2  Our next appointment is by telephone on 1/31  Please call the care guide team at [phone] if you need to cancel or reschedule your appointment.   Please call the Suicide and Crisis Lifeline: 988 call the [place] National Suicide Prevention Lifeline: 585-255-9001 or TTY: 845-659-1075 TTY 843-508-4876) to talk to a trained counselor call 1-800-273-TALK (toll free, 24 hour hotline) call 911 if you are experiencing a Mental Health or [place] or need someone to talk to.  Patient verbalizes understanding of instructions and care plan provided today and agrees to view in [place]. Active MyChart status and patient understanding of how to access instructions and care plan via MyChart confirmed with patient.     The patient has been provided with contact information for the care management team and has been advised to call with any health related questions or concerns.

## 2022-02-21 NOTE — Patient Outreach (Signed)
  Care Coordination   02/21/2022 Name: TYMERE DEPUY MRN: [id number] DOB: January 24, 1941   Care Coordination Outreach Attempts:  An unsuccessful telephone outreach was attempted for a scheduled appointment today.  Follow Up Plan:  Additional outreach attempts will be made to offer the patient care coordination information and services.   Encounter Outcome:  No Answer   Care Coordination Interventions:  No, not indicated    Valente David, RN, MSN, [facility] Care Management Care Management Coordinator [phone]

## 2022-02-21 NOTE — Patient Outreach (Signed)
  Care Coordination   Initial Visit Note   02/21/2022 Name: [name] MRN: [id number] DOB: 1940/11/20  [name] is a 82 y.o. year old male who sees Luking, Elayne Snare, MD for primary care. I spoke with [name], daughter of  [name] by phone today.  What matters to the patients health and wellness today?  Has had decreased strength and decline in health.  Most recent ED visit and hospitalizations were for CHF.      Goals Addressed             This Visit's Progress    Effective management of CHF       Care Coordination Interventions: Basic overview and discussion of pathophysiology of Heart Failure reviewed Provided education on low sodium diet Reviewed Heart Failure Action Plan in depth and provided written copy Reviewed role of diuretics in prevention of fluid overload and management of heart failure; Discussed the importance of keeping all appointments with provider Upcoming appointments discussed - 1/24 with CSW and 2/2 with PCP Will collaborate with CSW regarding difficulty going up/down steps to enter the home.  Has 2 steps, daughter concerned about fall risk.   Discussed using DME to decrease fall risk.  Has walker and wheelchair, only uses wheelchair when traveling due to size of doors in the home, it does not fit.  Discussed start of PT/OT, daughter state she has not received call from [place]. Call placed to [facility], advised that they have been trying to reach her to schedule home visit.  Upon reaching back out to daughter, she confirms she was able to connect with [facility] [place] and services will start on Friday.           SDOH assessments and interventions completed:  No     Care Coordination Interventions:  Yes, provided   Follow up plan: Follow up call scheduled for 1/31    Encounter Outcome:  Pt. Visit Completed   Chris David, RN, MSN, [place] Care Management Care Management Coordinator [phone]

## 2022-02-27 DIAGNOSIS — F32A Depression, unspecified: Secondary | ICD-10-CM | POA: Diagnosis not present

## 2022-02-27 DIAGNOSIS — J440 Chronic obstructive pulmonary disease with acute lower respiratory infection: Secondary | ICD-10-CM | POA: Diagnosis not present

## 2022-02-27 DIAGNOSIS — Z9981 Dependence on supplemental oxygen: Secondary | ICD-10-CM | POA: Diagnosis not present

## 2022-02-27 DIAGNOSIS — Z9181 History of falling: Secondary | ICD-10-CM | POA: Diagnosis not present

## 2022-02-27 DIAGNOSIS — E785 Hyperlipidemia, unspecified: Secondary | ICD-10-CM | POA: Diagnosis not present

## 2022-02-27 DIAGNOSIS — I130 Hypertensive heart and chronic kidney disease with heart failure and stage 1 through stage 4 chronic kidney disease, or unspecified chronic kidney disease: Secondary | ICD-10-CM | POA: Diagnosis not present

## 2022-02-27 DIAGNOSIS — R1314 Dysphagia, pharyngoesophageal phase: Secondary | ICD-10-CM | POA: Diagnosis not present

## 2022-02-27 DIAGNOSIS — Z7951 Long term (current) use of inhaled steroids: Secondary | ICD-10-CM | POA: Diagnosis not present

## 2022-02-27 DIAGNOSIS — H9193 Unspecified hearing loss, bilateral: Secondary | ICD-10-CM | POA: Diagnosis not present

## 2022-02-27 DIAGNOSIS — I2510 Atherosclerotic heart disease of native coronary artery without angina pectoris: Secondary | ICD-10-CM | POA: Diagnosis not present

## 2022-02-27 DIAGNOSIS — Z87891 Personal history of nicotine dependence: Secondary | ICD-10-CM | POA: Diagnosis not present

## 2022-02-27 DIAGNOSIS — I5033 Acute on chronic diastolic (congestive) heart failure: Secondary | ICD-10-CM | POA: Diagnosis not present

## 2022-02-27 DIAGNOSIS — G4733 Obstructive sleep apnea (adult) (pediatric): Secondary | ICD-10-CM | POA: Diagnosis not present

## 2022-02-27 DIAGNOSIS — R338 Other retention of urine: Secondary | ICD-10-CM | POA: Diagnosis not present

## 2022-02-27 DIAGNOSIS — J441 Chronic obstructive pulmonary disease with (acute) exacerbation: Secondary | ICD-10-CM | POA: Diagnosis not present

## 2022-02-27 DIAGNOSIS — J9621 Acute and chronic respiratory failure with hypoxia: Secondary | ICD-10-CM | POA: Diagnosis not present

## 2022-02-27 DIAGNOSIS — N1831 Chronic kidney disease, stage 3a: Secondary | ICD-10-CM | POA: Diagnosis not present

## 2022-02-28 ENCOUNTER — Ambulatory Visit: Payer: Self-pay | Admitting: *Deleted

## 2022-02-28 ENCOUNTER — Telehealth: Payer: Self-pay | Admitting: *Deleted

## 2022-02-28 ENCOUNTER — Encounter: Payer: Self-pay | Admitting: *Deleted

## 2022-02-28 NOTE — Patient Outreach (Signed)
  Care Coordination   Follow Up Visit Note   03/01/2022 Name: [name] MRN: [id number] DOB: 1940-10-17  [name] is a 82 y.o. year old male who sees Luking, Elayne Snare, MD for primary care. I spoke with Zorita Pang by phone today.  What matters to the patients health and wellness today?  Daughter report she is overwhelmed about medications patient is taking.      Goals Addressed             This Visit's Progress    Effective management of CHF   On track    Care Coordination Interventions: Basic overview and discussion of pathophysiology of Heart Failure reviewed Provided education on low sodium diet Reviewed Heart Failure Action Plan in depth and provided written copy Reviewed role of diuretics in prevention of fluid overload and management of heart failure; Discussed the importance of keeping all appointments with provider Upcoming appointments discussed - 1/24 with CSW and 2/2 with PCP Will collaborate with CSW regarding difficulty going up/down steps to enter the home.  Has 2 steps, daughter concerned about fall risk.   Discussed using DME to decrease fall risk.  Has walker and wheelchair, only uses wheelchair when traveling due to size of doors in the home, it does not fit.  Call placed to [facility] as well as message sent to PCP office regarding daughter's uncertainty about medications.  [facility] will review medications in the home with daughter and collaborate with PCP team for accuracy.           SDOH assessments and interventions completed:  No     Care Coordination Interventions:  Yes, provided   Follow up plan: Follow up call scheduled for 1/31    Encounter Outcome:  Pt. Visit Completed   Valente David, RN, MSN, [place] Care Management Care Management Coordinator [phone]

## 2022-02-28 NOTE — Patient Outreach (Signed)
Care Coordination   Follow Up Visit Note   02/28/2022  Name: [name] MRN: [id number] DOB: [birth year]  [name] is a 82 y.o. year old male who sees Luking, Elayne Snare, MD for primary care. I spoke with patient's daughter, [name] by phone today.  What matters to the patients health and wellness today?   Receive Assistance Obtaining In-Home Care Services.   Goals Addressed             This Visit's Progress    Receive Assistance Obtaining In-Home Care Services.   On track    Care Coordination Interventions:    Active Listening & Reflection Utilized.  Verbalization of Feelings Encouraged.  Emotional & Caregiver Support Provided. Caregiver Stress Acknowledged. Caregiver Resources Reviewed. Self-Enrollment in Caregiver Support Group Emphasized, From List Provided. Reminded of Crisis Support Information, [employer], From List Provided. Problem Solving Interventions Enacted. Task-Centered Solutions Employed.  Solution-Focused Strategies Activated.  Acceptance & Commitment Therapy Introduced. Brief Cognitive Behavioral Therapy Performed. Client-Centered Therapy Initiated. Discussed Higher Level of Care Options ([place], [place]) with Daughter, [name] & Encouraged Consideration.               ~ Patient Currently Resides with Daughter, [name].      ~ Daughter, [name] Provides 24 [place], As           Well As Assistance with All Activities of Daily Living. CSW Collaboration with Daughter, [name] to Confirm Her Desire for Patient to Remain in The Home, Denying Need for Placement of Any Kind, at Present Time. Confirmed Receipt & Thoroughly Reviewed the Following List of Agencies, [place] with Daughter, [name] to Ensure Understanding:     ~ [place]          ~ [place]          ~  Adult Day Care Programs          ~ Senior Resources of [employer]          ~ [place]   ~ 2023 Medicaid Tips   ~ Kohl's Application   ~ Instructions For How to Apply for American Financial   ~ [place] Instructions     ~ [place] with Daughter, [name] to Assist with Completion of Medicaid Application & Encourage Submission to The [place] [phone]), for Processing. CSW Collaboration with Daughter, [name] to Eli Lilly and Company with Agencies, Lubrizol Corporation of Interest, From List Provided. Continue to [facility], Occupational Therapy, [place], through [facility] [phone]). CSW Collaboration with Daughter, [name] to Automatic Data of Handicapped Accessible Wheelchair Ramp to [employer]ning [employer]. ~ One Set of 2 Steps to [employer]. ~ Bilateral Kelly Services Already [place]. ~ Pictures & Measurements Obtained. ~ Requiring Financial Assistance Due to Fixed Income. CSW Collaboration with Daughter, [name] to Review List of [place] with [place], Emailed on 02/28/2022: ~ Aging, [place] of [place] (#        [phone])           ~ Armen Pickup [phone])          ~  [place] Handyman (# [phone]) ~ Ignacio RAMMP - [place] (#     [phone]) ~ Ramping It Up - Tech Data Corporation [phone]) [place] with Daughter, [name] to Encourage Contact with [place], with [facility] [phone]), During Next Scheduled Home Visit, to Inquire About  Recommendations for Handicapped Public librarian.  CSW Collaboration with [name], Nurse Case Manager with [place] [facility] [phone]), to Request Telephone Outreach Call to Daughter, [name] to Review Diuretics, Per Ms. Bari Edward Request. CSW Collaboration with Daughter, [name] to Remind Her of Patient's Follow-Up Appointment with Primary Care Provider, [name] with [place] [phone]), Scheduled on 03/09/2022 at 11:20 AM. CSW Collaboration with Daughter, [name] to Confirm the Following Durable Medical Equipment in The Home: ~ Gilford Rile   ~ Wheelchair ~ [place] ~ [place] ~ [place] ~ Cleda Mccreedy in [place] with Daughter, [name] to Request She Hold Off on Completing Application for [place], through [employer] [phone]), Until Approved for Medicaid, through The [place] [phone]). CSW Collaboration with Daughter, [name] to Request She Have Her Sister, [name] Contact CSW Directly [phone]# [phone]), to Provide Name & [place], Paid For Through [employer], While Patient Was Residing with [name]. ~ Received 8 Hours of In-Home Care Services Per Week, Through Citigroup. CSW Collaboration with Daughter, [name] to Nordstrom of Transportation Benefit through [employer] [phone]# [phone]).      SDOH assessments and interventions completed:  Yes.  Care Coordination Interventions:  Yes, provided.   Follow up plan: Follow up call scheduled for 03/16/2022 at 2:30 pm.  Encounter Outcome:  Pt. Visit Completed.   [name], BSW, MSW, LCSW  Licensed Engineer, [facility]  Mailing [place] [address], [place], [place] [address] Physical Address-[address] [place], [place] [address] Toll Free Main # [phone] Fax # [phone] Cell # [phone] [name]

## 2022-02-28 NOTE — Patient Instructions (Addendum)
Visit Information  Thank you for taking time to visit with me today. Please don't hesitate to contact me if I can be of assistance to you.   Following are the goals we discussed today:   Goals Addressed             This Visit's Progress    Receive Assistance Obtaining In-Home Care Services.   On track    Care Coordination Interventions:    Active Listening & Reflection Utilized.  Verbalization of Feelings Encouraged.  Emotional & Caregiver Support Provided. Caregiver Stress Acknowledged. Caregiver Resources Reviewed. Self-Enrollment in Caregiver Support Group Emphasized, From List Provided. Reminded of Crisis Support Information, [employer], From List Provided. Problem Solving Interventions Enacted. Task-Centered Solutions Employed.  Solution-Focused Strategies Activated.  Acceptance & Commitment Therapy Introduced. Brief Cognitive Behavioral Therapy Performed. Client-Centered Therapy Initiated. Discussed Higher Level of Care Options ([place], [place]) with Daughter, [name] & Encouraged Consideration.             ~ Patient Currently Resides with Daughter, [name].    ~ Daughter, [name] Provides 24 [place], As Well       As Assistance with All Activities of Daily Living. CSW Collaboration with Daughter, [name] to Confirm Her Desire for Patient to Remain in The Home, Denying Need for Placement of Any Kind, at Present Time. Confirmed Receipt & Thoroughly Reviewed the Following List of Agencies, [place] with Daughter, [name] to Ensure Understanding:     ~ [place]          ~ [place]          ~ Adult Day Care Programs          ~ Senior Resources of [employer] Scientific          ~ [place]   ~ 2023 Medicaid Tips   ~ Kohl's Application   ~ Instructions  For How to Apply for American Financial   ~ [place] Instructions     ~ [place] with Daughter, [name] to Assist with Completion of Medicaid Application & Encourage Submission to The [place] [phone]), for Processing. CSW Collaboration with Daughter, [name] to Eli Lilly and Company with Agencies, Lubrizol Corporation of Interest, From List Provided. Continue to [place] [facility], Occupational Therapy, [place], through [facility] [phone]). CSW Collaboration with Daughter, [name] to Automatic Data of Handicapped Accessible Wheelchair Ramp to Corning Incorporated. ~ One Set of 2 Steps to WESCO International. ~ Bilateral Kelly Services Already [place]. ~ Pictures & Measurements Obtained. ~ Requiring Financial Assistance Due to Fixed Income. CSW Collaboration with Daughter, [name] to Review List of [place] with [place], Emailed on 02/28/2022: ~ Aging, [place] of [place] (#      [phone])           ~ Armen Pickup [phone])          ~ Benjamine Sprague (# [phone]) ~ [place] RAMMP - [place] (#     [phone]) ~ Ramping It Up - Tech Data Corporation [phone]) CSW  Collaboration with Daughter, [name] to Encourage Contact with [place] Therapist, with [facility] [phone]), During Next Scheduled Home Visit, to Inquire About Recommendations for Handicapped Public librarian.  CSW Collaboration with Valente David, Nurse Case Manager with [place] [facility] [phone]), to Request  Telephone Outreach Call to Daughter, [name] to Review Diuretics, Per Ms. Bari Edward Request. CSW Collaboration with Daughter, [name] to Remind Her of Patient's Follow-Up Appointment with Primary Care Provider, Dr. Sallee Lange with [place] [phone]), Scheduled on 03/09/2022 at 11:20 AM. CSW Collaboration with Daughter, [name] to Confirm the Following Durable Medical Equipment in The Home: ~ Gilford Rile   ~ Wheelchair ~ [place] ~ [place] ~ [place] ~ Cleda Mccreedy in [place] with Daughter, [name] to Request She Hold Off on Completing Application for [place], through Toll Brothers [phone]), Until Approved for Medicaid, through The [place] [phone]). CSW Collaboration with Daughter, [name] to Request She Have Her Sister, Marsh Heckler Contact CSW Directly [phone]# [phone]), to Provide Name & [place], Paid For Through NiSource, While Patient Was Residing with Ms. Alford [place]. ~ Received 8 Hours of In-Home Care Services Per Week, Through Citigroup. CSW Collaboration with Daughter, [name] to Nordstrom of Transportation Benefit through Dana Corporation [phone]# [phone]).      Our next appointment is by telephone on 03/16/2022 at 2:30 pm.  Please call the care guide team at [phone] if you need to cancel or reschedule your appointment.   If you are experiencing a Mental Health or [place] or need someone to talk to, please call the Suicide and Crisis Lifeline: 988 call the [place] National Suicide Prevention Lifeline: 239 759 1337 or TTY: 681-377-6023 TTY 310-297-5923) to talk to a trained counselor call 1-800-273-TALK (toll free, 24 hour hotline) go to  [facility] [address], [place] [phone]) call the [place]: [phone] call 911  Patient verbalizes understanding of instructions and care plan provided today and agrees to view in [place]. Active MyChart status and patient understanding of how to access instructions and care plan via MyChart confirmed with patient.     Telephone follow up appointment with care management team member scheduled for:  03/16/2022 at 2:30 pm.  Nat Christen, BSW, MSW, [place]  Licensed Clinical Social Worker  [place]  Mailing [place]. [address] [place], [place] [address] Physical Address-[address], [place], [place] [address] Toll Free Main # [phone] Fax # [phone] Cell # [phone] [name]

## 2022-03-01 NOTE — Patient Instructions (Signed)
Visit Information  Thank you for taking time to visit with me today. Please don't hesitate to contact me if I can be of assistance to you before our next scheduled telephone appointment.  Following are the goals we discussed today:  Listen for call from [place] for home visit to review medications.   Our next appointment is by telephone on 1/31  Please call the care guide team at [phone] if you need to cancel or reschedule your appointment.   Please call the Suicide and Crisis Lifeline: 988 call the [place] National Suicide Prevention Lifeline: (475)070-0498 or TTY: 670 206 8122 TTY (216)296-0814) to talk to a trained counselor call 1-800-273-TALK (toll free, 24 hour hotline) call 911 if you are experiencing a Mental Health or [place] or need someone to talk to.  Patient verbalizes understanding of instructions and care plan provided today and agrees to view in [place]. Active MyChart status and patient understanding of how to access instructions and care plan via MyChart confirmed with patient.     The patient has been provided with contact information for the care management team and has been advised to call with any health related questions or concerns.   Valente David, RN, MSN, [place] Care Management Care Management Coordinator [phone]

## 2022-03-01 NOTE — Telephone Encounter (Signed)
Sorry you are having this situation In order to streamline this it would be very very important for family to be able to provide a list of medicines that may have that they are confused about or uncertain about or even potentially a list of what they are currently giving him and what they are not giving him (This would help streamline any interactions with them versus myself getting on the phone and listening to them list off the medicines to me) thank you If they are able on my chart to type out exactly what medicines he is on or able to provide a list or discussed specifics with the nurse who can pass this on to me I can review it and then send message back through the nurses Patient does have a follow-up next week as well thank you

## 2022-03-01 NOTE — Telephone Encounter (Signed)
Message from [place]:  spoke with [name], Mr. Hagemann's daughter. She has questions about his medications and correct doses, particularly fluid pills and antihypertensive. She is overwhelmed and not sure if he is on what he should be taking or if it's too many. The home health nurse brought up the concern because he was on so many different ones. Would someone be able to call to speak with her? The [facility] may call for clarification as well.

## 2022-03-03 ENCOUNTER — Telehealth: Payer: Self-pay | Admitting: Family Medicine

## 2022-03-03 NOTE — Telephone Encounter (Signed)
Nurses Please have family bring all of his medicines with him to his next visit they had questions regarding the medications.  If there are any clear-cut issues going on please let me know otherwise it would be wise for [place] to go over all of his medicines when he follows up

## 2022-03-04 DIAGNOSIS — J449 Chronic obstructive pulmonary disease, unspecified: Secondary | ICD-10-CM | POA: Diagnosis not present

## 2022-03-05 NOTE — Telephone Encounter (Signed)
Mychart message sent to patient.

## 2022-03-05 NOTE — Telephone Encounter (Signed)
My chart message : Last read by [name] at 10:54 AM on 03/05/2022.

## 2022-03-06 ENCOUNTER — Telehealth: Payer: Self-pay | Admitting: Family Medicine

## 2022-03-06 DIAGNOSIS — R1314 Dysphagia, pharyngoesophageal phase: Secondary | ICD-10-CM | POA: Diagnosis not present

## 2022-03-06 DIAGNOSIS — I2510 Atherosclerotic heart disease of native coronary artery without angina pectoris: Secondary | ICD-10-CM | POA: Diagnosis not present

## 2022-03-06 DIAGNOSIS — R338 Other retention of urine: Secondary | ICD-10-CM | POA: Diagnosis not present

## 2022-03-06 DIAGNOSIS — Z9181 History of falling: Secondary | ICD-10-CM | POA: Diagnosis not present

## 2022-03-06 DIAGNOSIS — G4733 Obstructive sleep apnea (adult) (pediatric): Secondary | ICD-10-CM | POA: Diagnosis not present

## 2022-03-06 DIAGNOSIS — I5033 Acute on chronic diastolic (congestive) heart failure: Secondary | ICD-10-CM | POA: Diagnosis not present

## 2022-03-06 DIAGNOSIS — I130 Hypertensive heart and chronic kidney disease with heart failure and stage 1 through stage 4 chronic kidney disease, or unspecified chronic kidney disease: Secondary | ICD-10-CM | POA: Diagnosis not present

## 2022-03-06 DIAGNOSIS — Z87891 Personal history of nicotine dependence: Secondary | ICD-10-CM | POA: Diagnosis not present

## 2022-03-06 DIAGNOSIS — Z9981 Dependence on supplemental oxygen: Secondary | ICD-10-CM | POA: Diagnosis not present

## 2022-03-06 DIAGNOSIS — J440 Chronic obstructive pulmonary disease with acute lower respiratory infection: Secondary | ICD-10-CM | POA: Diagnosis not present

## 2022-03-06 DIAGNOSIS — N1831 Chronic kidney disease, stage 3a: Secondary | ICD-10-CM | POA: Diagnosis not present

## 2022-03-06 DIAGNOSIS — Z7951 Long term (current) use of inhaled steroids: Secondary | ICD-10-CM | POA: Diagnosis not present

## 2022-03-06 DIAGNOSIS — E785 Hyperlipidemia, unspecified: Secondary | ICD-10-CM | POA: Diagnosis not present

## 2022-03-06 DIAGNOSIS — H9193 Unspecified hearing loss, bilateral: Secondary | ICD-10-CM | POA: Diagnosis not present

## 2022-03-06 DIAGNOSIS — J441 Chronic obstructive pulmonary disease with (acute) exacerbation: Secondary | ICD-10-CM | POA: Diagnosis not present

## 2022-03-06 DIAGNOSIS — F32A Depression, unspecified: Secondary | ICD-10-CM | POA: Diagnosis not present

## 2022-03-06 DIAGNOSIS — J9621 Acute and chronic respiratory failure with hypoxia: Secondary | ICD-10-CM | POA: Diagnosis not present

## 2022-03-06 MED ORDER — VITAMIN D (ERGOCALCIFEROL) 1.25 MG (50000 UNIT) PO CAPS: 50000.0000 [IU] | ORAL_CAPSULE | ORAL | 0 refills | Status: DC

## 2022-03-06 NOTE — Telephone Encounter (Signed)
Chrys Racer from [facility] calling to make provider aware of some safety red flags-4 lb weight gain in 2 days; 2+ pitting edema; coughing up lots of sputum (PT saw white but patient daughter reports yellow sputum). Pt currently on 4L oxygen; no chest pain or increased shortness of breath. PT also states that pt oxygen tubing is short. Has portable concentrator but is not sure if it is set on 4L or not; company set up Marine scientist. PT also needing verbal orders for PT 2 week 6 and one week 2. Please advise. Thank you  [phone]

## 2022-03-06 NOTE — Telephone Encounter (Signed)
Left message to return call. Message sent to patient. Refills sent to pharmacy

## 2022-03-06 NOTE — Telephone Encounter (Signed)
Patient requesting refill on Vitamin D2 50,000 [facility]

## 2022-03-06 NOTE — Telephone Encounter (Signed)
Due to the weight gain may take 1 additional torsemide for today and tomorrow May have orders for physical therapy occupational therapy etc. May have 6 months refill on vitamin D Keep follow-up visit on February 2 Bring all medications with him to the visit

## 2022-03-06 NOTE — Addendum Note (Signed)
Addended by: Vicente Males on: 03/06/2022 04:06 PM   Modules accepted: Orders

## 2022-03-07 ENCOUNTER — Encounter: Payer: Medicare Other | Admitting: *Deleted

## 2022-03-07 ENCOUNTER — Other Ambulatory Visit: Payer: Self-pay

## 2022-03-07 ENCOUNTER — Ambulatory Visit: Payer: Self-pay | Admitting: *Deleted

## 2022-03-07 MED ORDER — TORSEMIDE 20 MG PO TABS: 20.0000 mg | ORAL_TABLET | Freq: Every day | ORAL | 3 refills | Status: DC

## 2022-03-07 NOTE — Patient Outreach (Signed)
  Care Coordination   03/07/2022 Name: [name] MRN: [id number] DOB: 02/13/1940   Care Coordination Outreach Attempts:  An unsuccessful telephone outreach was attempted for a scheduled appointment today.  Follow Up Plan:  Additional outreach attempts will be made to offer the patient care coordination information and services.   Encounter Outcome:  No Answer   Care Coordination Interventions:  No, not indicated    Valente David, RN, MSN, [facility] Care Management Care Management Coordinator [phone]

## 2022-03-07 NOTE — Patient Outreach (Signed)
  Care Coordination   Follow Up Visit Note   03/07/2022 Name: [name] MRN: [id number] DOB: April 14, 1940  [name] is a 82 y.o. year old male who sees Luking, Elayne Snare, MD for primary care. I spoke with [name], daughter of [name] by phone today.  What matters to the patients health and wellness today?  Taking fluid pills and decreasing swelling.     Goals Addressed             This Visit's Progress    Effective management of CHF   On track    Care Coordination Interventions: Basic overview and discussion of pathophysiology of Heart Failure reviewed Provided education on low sodium diet Reviewed Heart Failure Action Plan in depth and provided written copy Reviewed role of diuretics in prevention of fluid overload and management of heart failure; Discussed the importance of keeping all appointments with provider Upcoming appointments reviewed 2/2 with PCP Collaborate with CSW regarding difficulty going up/down steps to enter the home.  Has 2 steps, daughter concerned about fall risk.   Discussed using DME to decrease fall risk.  Has walker and wheelchair, only uses wheelchair when traveling due to size of doors in the home, it does not fit.  Confirmed that [place] nurse was able to review medications in the home with daughter.  She reports better understanding of management at this time.  She will take all medications to MD office visit this week.          SDOH assessments and interventions completed:  No     Care Coordination Interventions:  Yes, provided   Follow up plan: Follow up call scheduled for 2/14    Encounter Outcome:  Pt. Visit Completed   Valente David, RN, MSN, [place] Care Management Care Management Coordinator [phone]

## 2022-03-07 NOTE — Telephone Encounter (Signed)
[name] returned call and verbalized understanding. Pt is not residing with [name]. Contacted [name] to inform her of torsemide. [name] states she did not have that medication on hand; med resent to [facility]. [name] states he has had a 3 lb weigh gain in 3 days; 4 lbs in 2 days. [name] states neither pt nor caregiver are the best historians. [name] states dystolic has been low and pt has had no energy. Informed [name] to please be sure to bring all medications to visit. [name] verbalized understanding.

## 2022-03-07 NOTE — Patient Instructions (Signed)
Visit Information  Thank you for taking time to visit with me today. Please don't hesitate to contact me if I can be of assistance to you before our next scheduled telephone appointment.  Following are the goals we discussed today:  Take all medications to Dr. Lance Sell office with you on Friday, 2/2  Our next appointment is by telephone on 2/14  Please call the care guide team at [phone] if you need to cancel or reschedule your appointment.   Please call the Suicide and Crisis Lifeline: 988 call the [place] National Suicide Prevention Lifeline: (514)353-6521 or TTY: 815 591 3369 TTY 330 653 1601) to talk to a trained counselor call 1-800-273-TALK (toll free, 24 hour hotline) call 911 if you are experiencing a Mental Health or [place] or need someone to talk to.  Patient verbalizes understanding of instructions and care plan provided today and agrees to view in [place]. Active MyChart status and patient understanding of how to access instructions and care plan via MyChart confirmed with patient.     The patient has been provided with contact information for the care management team and has been advised to call with any health related questions or concerns.   Valente David, RN, MSN, [place] Care Management Care Management Coordinator [phone]

## 2022-03-09 ENCOUNTER — Encounter: Payer: Self-pay | Admitting: Family Medicine

## 2022-03-09 ENCOUNTER — Ambulatory Visit (INDEPENDENT_AMBULATORY_CARE_PROVIDER_SITE_OTHER): Payer: Medicare Other | Admitting: Family Medicine

## 2022-03-09 VITALS — BP 108/52 | HR 69 | Wt 311.4 lb

## 2022-03-09 DIAGNOSIS — I5032 Chronic diastolic (congestive) heart failure: Secondary | ICD-10-CM | POA: Diagnosis not present

## 2022-03-09 DIAGNOSIS — I10 Essential (primary) hypertension: Secondary | ICD-10-CM

## 2022-03-09 DIAGNOSIS — F324 Major depressive disorder, single episode, in partial remission: Secondary | ICD-10-CM | POA: Diagnosis not present

## 2022-03-09 DIAGNOSIS — J9611 Chronic respiratory failure with hypoxia: Secondary | ICD-10-CM | POA: Diagnosis not present

## 2022-03-09 DIAGNOSIS — M450 Ankylosing spondylitis of multiple sites in spine: Secondary | ICD-10-CM | POA: Diagnosis not present

## 2022-03-09 DIAGNOSIS — N1831 Chronic kidney disease, stage 3a: Secondary | ICD-10-CM

## 2022-03-09 MED ORDER — AMLODIPINE BESYLATE 10 MG PO TABS: ORAL_TABLET | ORAL | 5 refills | Status: DC

## 2022-03-09 MED ORDER — HYDRALAZINE HCL 100 MG PO TABS: ORAL_TABLET | ORAL | 1 refills | Status: DC

## 2022-03-09 NOTE — Progress Notes (Addendum)
Subjective:    Patient ID: [name], male    DOB: April 01, 1940, 82 y.o.   MRN: [id number]  HPI Pt arrives to follow up with daughter [name]. Pt would provider to take a look at ears.   Numbness in right arm from elbow to pinky finger.   Blood pressure has been low and daughter [name] is worried.   Daughter has all medications at visit.  Very nice patient Nice family member They are doing the best they can with helping him day-to-day Not having any major setbacks recently Blood pressure is on the low end His swelling in his legs is still present but not as bad as what it was He does get out of breath with activity has weakness in his arms and legs.  He is pretty much homebound.  Cannot drive.  He is dependent on others for his ADLs.  He cannot get out of the house easily.  Currently has home physical therapy occupational therapy and home health which are all appropriate for his underlying heart disease ataxia and weakness and chronic edema      Review of Systems     Objective:   Physical Exam General-in no acute distress Eyes-no discharge Lungs-respiratory rate normal, CTA CV-no murmurs,RRR Extremities skin warm dry moderate edema Neuro grossly normal Behavior normal, alert        Assessment & Plan:  1. Chronic diastolic heart failure ([name]) Blood pressure is too low Reduce amlodipine to 5 mg Reduce hydralazine to 50 mg twice daily Continue diuretic When possible sleep in a bed to reduce the edema in the legs Follow-up here every 3 months and cardiology as well  2. Stage 3a chronic kidney disease (CKD) ([name]) Relook at kidney function.  Continue diuretic heart medicines.  Avoid excessive salt intake - Basic metabolic panel - Magnesium  3. Essential hypertension Blood pressure on the low side reduce medication follow-up again in a month's time if doing well then we will follow-up every 3 months - Basic metabolic panel - Magnesium  4. Depression, major,  single episode, in partial remission ([place]) He is doing better with the depression just mild at this point he is in overall better frame of mind  5. Chronic respiratory failure with hypoxia ([name]) Uses oxygen on a regular basis for COPD with hypoxia  6. Ankylosing spondylitis of multiple sites in spine [facility]) Has chronic neck and back pain uses Tylenol for this stop meloxicam because of risk of bleeding and kidney functions  7. Morbid obesity ([place]) Portion control regular physical activity as best as tolerated  Patient is homebound and would benefit from ongoing physical therapy occupational therapy and home health Patient also would benefit from ongoing oxygen Recheck in 1 month  The patient due to his orthopedic issues as well as underlying congestive heart failure issues has very difficult time with ADLs.  He does need a bariatric bedside commode because the bathroom that he has access to his very small will not accommodate his walker and he has had falls plus also he cannot get off the toilet because it is so low that is why he needs a bariatric bedside commode.  Also needs a walker with a seat he has significant mobility issues cannot walk without a walker.  Can only go limited steps because of underlying heart and orthopedic issues and imbalance issues.  Also utilizes wheelchair for longer distances.  It is medically necessary for him to have all of these.  It should be noted  that without these equipment he is at high risk of falling and injury and potential morbidity and mortality.  He cannot use a standard cane to get around.  He is not stable enough to walk on his own.  It is medically necessary for him to have appropriate wheelchair.  Also this would allow him to achieve mobility related activities of daily living including moving from room to room plus also being able to stay in his home and not having to be placed into a nursing home.

## 2022-03-10 MED ORDER — AMLODIPINE BESYLATE 5 MG PO TABS: ORAL_TABLET | ORAL | 5 refills | Status: DC

## 2022-03-12 DIAGNOSIS — I5033 Acute on chronic diastolic (congestive) heart failure: Secondary | ICD-10-CM | POA: Diagnosis not present

## 2022-03-12 DIAGNOSIS — R338 Other retention of urine: Secondary | ICD-10-CM | POA: Diagnosis not present

## 2022-03-12 DIAGNOSIS — G4733 Obstructive sleep apnea (adult) (pediatric): Secondary | ICD-10-CM | POA: Diagnosis not present

## 2022-03-12 DIAGNOSIS — Z87891 Personal history of nicotine dependence: Secondary | ICD-10-CM | POA: Diagnosis not present

## 2022-03-12 DIAGNOSIS — J441 Chronic obstructive pulmonary disease with (acute) exacerbation: Secondary | ICD-10-CM | POA: Diagnosis not present

## 2022-03-12 DIAGNOSIS — N1831 Chronic kidney disease, stage 3a: Secondary | ICD-10-CM | POA: Diagnosis not present

## 2022-03-12 DIAGNOSIS — J440 Chronic obstructive pulmonary disease with acute lower respiratory infection: Secondary | ICD-10-CM | POA: Diagnosis not present

## 2022-03-12 DIAGNOSIS — J9621 Acute and chronic respiratory failure with hypoxia: Secondary | ICD-10-CM | POA: Diagnosis not present

## 2022-03-12 DIAGNOSIS — H9193 Unspecified hearing loss, bilateral: Secondary | ICD-10-CM | POA: Diagnosis not present

## 2022-03-12 DIAGNOSIS — Z9181 History of falling: Secondary | ICD-10-CM | POA: Diagnosis not present

## 2022-03-12 DIAGNOSIS — Z7951 Long term (current) use of inhaled steroids: Secondary | ICD-10-CM | POA: Diagnosis not present

## 2022-03-12 DIAGNOSIS — I130 Hypertensive heart and chronic kidney disease with heart failure and stage 1 through stage 4 chronic kidney disease, or unspecified chronic kidney disease: Secondary | ICD-10-CM | POA: Diagnosis not present

## 2022-03-12 DIAGNOSIS — R1314 Dysphagia, pharyngoesophageal phase: Secondary | ICD-10-CM | POA: Diagnosis not present

## 2022-03-12 DIAGNOSIS — Z9981 Dependence on supplemental oxygen: Secondary | ICD-10-CM | POA: Diagnosis not present

## 2022-03-12 DIAGNOSIS — I2510 Atherosclerotic heart disease of native coronary artery without angina pectoris: Secondary | ICD-10-CM | POA: Diagnosis not present

## 2022-03-12 DIAGNOSIS — F32A Depression, unspecified: Secondary | ICD-10-CM | POA: Diagnosis not present

## 2022-03-12 DIAGNOSIS — E785 Hyperlipidemia, unspecified: Secondary | ICD-10-CM | POA: Diagnosis not present

## 2022-03-13 ENCOUNTER — Other Ambulatory Visit: Payer: Self-pay

## 2022-03-13 MED ORDER — HYDRALAZINE HCL 100 MG PO TABS: ORAL_TABLET | ORAL | 1 refills | Status: DC

## 2022-03-13 NOTE — Progress Notes (Signed)
Directions for Hydralazine updated to 1/2 tab BID

## 2022-03-14 DIAGNOSIS — Z7951 Long term (current) use of inhaled steroids: Secondary | ICD-10-CM | POA: Diagnosis not present

## 2022-03-14 DIAGNOSIS — N1831 Chronic kidney disease, stage 3a: Secondary | ICD-10-CM | POA: Diagnosis not present

## 2022-03-14 DIAGNOSIS — H9193 Unspecified hearing loss, bilateral: Secondary | ICD-10-CM | POA: Diagnosis not present

## 2022-03-14 DIAGNOSIS — I5033 Acute on chronic diastolic (congestive) heart failure: Secondary | ICD-10-CM | POA: Diagnosis not present

## 2022-03-14 DIAGNOSIS — Z9981 Dependence on supplemental oxygen: Secondary | ICD-10-CM | POA: Diagnosis not present

## 2022-03-14 DIAGNOSIS — J440 Chronic obstructive pulmonary disease with acute lower respiratory infection: Secondary | ICD-10-CM | POA: Diagnosis not present

## 2022-03-14 DIAGNOSIS — E785 Hyperlipidemia, unspecified: Secondary | ICD-10-CM | POA: Diagnosis not present

## 2022-03-14 DIAGNOSIS — F32A Depression, unspecified: Secondary | ICD-10-CM | POA: Diagnosis not present

## 2022-03-14 DIAGNOSIS — J9621 Acute and chronic respiratory failure with hypoxia: Secondary | ICD-10-CM | POA: Diagnosis not present

## 2022-03-14 DIAGNOSIS — I2510 Atherosclerotic heart disease of native coronary artery without angina pectoris: Secondary | ICD-10-CM | POA: Diagnosis not present

## 2022-03-14 DIAGNOSIS — G4733 Obstructive sleep apnea (adult) (pediatric): Secondary | ICD-10-CM | POA: Diagnosis not present

## 2022-03-14 DIAGNOSIS — R1314 Dysphagia, pharyngoesophageal phase: Secondary | ICD-10-CM | POA: Diagnosis not present

## 2022-03-14 DIAGNOSIS — Z9181 History of falling: Secondary | ICD-10-CM | POA: Diagnosis not present

## 2022-03-14 DIAGNOSIS — J441 Chronic obstructive pulmonary disease with (acute) exacerbation: Secondary | ICD-10-CM | POA: Diagnosis not present

## 2022-03-14 DIAGNOSIS — I130 Hypertensive heart and chronic kidney disease with heart failure and stage 1 through stage 4 chronic kidney disease, or unspecified chronic kidney disease: Secondary | ICD-10-CM | POA: Diagnosis not present

## 2022-03-14 DIAGNOSIS — R338 Other retention of urine: Secondary | ICD-10-CM | POA: Diagnosis not present

## 2022-03-14 DIAGNOSIS — Z87891 Personal history of nicotine dependence: Secondary | ICD-10-CM | POA: Diagnosis not present

## 2022-03-15 DIAGNOSIS — J449 Chronic obstructive pulmonary disease, unspecified: Secondary | ICD-10-CM | POA: Diagnosis not present

## 2022-03-16 ENCOUNTER — Ambulatory Visit: Payer: Self-pay | Admitting: *Deleted

## 2022-03-16 ENCOUNTER — Telehealth: Payer: Self-pay

## 2022-03-16 DIAGNOSIS — J9621 Acute and chronic respiratory failure with hypoxia: Secondary | ICD-10-CM | POA: Diagnosis not present

## 2022-03-16 DIAGNOSIS — Z7951 Long term (current) use of inhaled steroids: Secondary | ICD-10-CM | POA: Diagnosis not present

## 2022-03-16 DIAGNOSIS — R338 Other retention of urine: Secondary | ICD-10-CM | POA: Diagnosis not present

## 2022-03-16 DIAGNOSIS — I130 Hypertensive heart and chronic kidney disease with heart failure and stage 1 through stage 4 chronic kidney disease, or unspecified chronic kidney disease: Secondary | ICD-10-CM | POA: Diagnosis not present

## 2022-03-16 DIAGNOSIS — R1314 Dysphagia, pharyngoesophageal phase: Secondary | ICD-10-CM | POA: Diagnosis not present

## 2022-03-16 DIAGNOSIS — I2510 Atherosclerotic heart disease of native coronary artery without angina pectoris: Secondary | ICD-10-CM | POA: Diagnosis not present

## 2022-03-16 DIAGNOSIS — I5033 Acute on chronic diastolic (congestive) heart failure: Secondary | ICD-10-CM | POA: Diagnosis not present

## 2022-03-16 DIAGNOSIS — F32A Depression, unspecified: Secondary | ICD-10-CM | POA: Diagnosis not present

## 2022-03-16 DIAGNOSIS — J441 Chronic obstructive pulmonary disease with (acute) exacerbation: Secondary | ICD-10-CM | POA: Diagnosis not present

## 2022-03-16 DIAGNOSIS — H9193 Unspecified hearing loss, bilateral: Secondary | ICD-10-CM | POA: Diagnosis not present

## 2022-03-16 DIAGNOSIS — Z87891 Personal history of nicotine dependence: Secondary | ICD-10-CM | POA: Diagnosis not present

## 2022-03-16 DIAGNOSIS — Z9181 History of falling: Secondary | ICD-10-CM | POA: Diagnosis not present

## 2022-03-16 DIAGNOSIS — G4733 Obstructive sleep apnea (adult) (pediatric): Secondary | ICD-10-CM | POA: Diagnosis not present

## 2022-03-16 DIAGNOSIS — N1831 Chronic kidney disease, stage 3a: Secondary | ICD-10-CM | POA: Diagnosis not present

## 2022-03-16 DIAGNOSIS — E785 Hyperlipidemia, unspecified: Secondary | ICD-10-CM | POA: Diagnosis not present

## 2022-03-16 DIAGNOSIS — I10 Essential (primary) hypertension: Secondary | ICD-10-CM | POA: Diagnosis not present

## 2022-03-16 DIAGNOSIS — Z9981 Dependence on supplemental oxygen: Secondary | ICD-10-CM | POA: Diagnosis not present

## 2022-03-16 DIAGNOSIS — J440 Chronic obstructive pulmonary disease with acute lower respiratory infection: Secondary | ICD-10-CM | POA: Diagnosis not present

## 2022-03-16 NOTE — Telephone Encounter (Signed)
Warnell Forester Physical therapist called and stated the patient family member got a systolic BP of [id number] and the patient had headache, SOB and fatigue. Pt stated after arrival he checked BP and patient Systolic was between  0000000 systolic and patient seemed to be doing better. PT stated patient BP medication was decreased. Advised per Dr Wolfgang Phoenix to increase hydralazine 1/2 tablet 3 times daily and follow up with Dr Wolfgang Phoenix next week. If patient gets worse advises to go to ED. Warnell Forester Physical therapy verbalized understanding and informed patient's family.

## 2022-03-17 LAB — BASIC METABOLIC PANEL
BUN/Creatinine Ratio: 20 (ref 10–24)
BUN: 26 mg/dL (ref 8–27)
CO2: 22 mmol/L (ref 20–29)
Calcium: 10 mg/dL (ref 8.6–10.2)
Chloride: 104 mmol/L (ref 96–106)
Creatinine, Ser: 1.32 mg/dL — ABNORMAL HIGH (ref 0.76–1.27)
Glucose: 109 mg/dL — ABNORMAL HIGH (ref 70–99)
Potassium: 5 mmol/L (ref 3.5–5.2)
Sodium: 142 mmol/L (ref 134–144)
eGFR: 54 mL/min/{1.73_m2} — ABNORMAL LOW (ref >59)

## 2022-03-17 LAB — MAGNESIUM: Magnesium: 2.2 mg/dL (ref 1.6–2.3)

## 2022-03-18 ENCOUNTER — Encounter: Payer: Self-pay | Admitting: *Deleted

## 2022-03-18 NOTE — Patient Instructions (Signed)
Visit Information  Thank you for taking time to visit with me today. Please don't hesitate to contact me if I can be of assistance to you.   Following are the goals we discussed today:   Goals Addressed             This Visit's Progress    Receive Assistance Obtaining In-Home Care Services.   On track    Care Coordination Interventions:  Interventions Today    Flowsheet Row Most Recent Value  Chronic Disease Discussed/Reviewed   Chronic disease discussed/reviewed during today's visit Chronic Kidney Disease/End Stage Renal Disease (ESRD)  General Interventions   General Interventions Discussed/Reviewed General Interventions Discussed, General Interventions Reviewed, Durable Medical Equipment (DME), Health Screening, Community Resources, Doctor Visits  Doctor Visits Discussed/Reviewed Doctor Visits Discussed, Doctor Visits Reviewed, Annual Wellness Visits, PCP  Durable Medical Equipment (DME) Bed side commode, BP Cuff, Environmental consultant, Wheelchair, Estate agent  PCP/Specialist Visits Compliance with follow-up visit  Exercise Interventions   Exercise Discussed/Reviewed Exercise Discussed, Exercise Reviewed, Weight Managment, Assistive device use and maintanence  Weight Management Weight loss  Education Interventions   Education Provided Provided Verbal Education  Provided Verbal Education On Nutrition, Mental Health/Coping with Illness, Exercise, Medication, When to see the doctor  Mental Health Interventions   [place] Discussed, Mental Health Reviewed, Coping Strategies  Nutrition Interventions   Nutrition Discussed/Reviewed Nutrition Discussed, Nutrition Reviewed, Portion sizes, Decreasing sugar intake, Increaing proteins, Decreasing fats, Decreasing salt  Pharmacy Interventions   Pharmacy Dicussed/Reviewed Medication Adherence, Affording Medications  Safety Interventions   Safety Discussed/Reviewed Safety Discussed, Safety  Reviewed, Fall Risk, Home Safety  Home Safety Assistive Devices, Need for home safety assessment, Refer for community resources      Active Listening & Reflection Utilized.  Verbalization of Feelings Encouraged.  Emotional & Caregiver Support Provided. Caregiver Stress Acknowledged. Caregiver Resources Reviewed. Self-Enrollment in Caregiver Support Group Emphasized, From List Provided. Reminded of Crisis Support Information, [employer] Media, From List Provided. Problem Solving Interventions Enacted. Task-Centered Solutions Employed.  Solution-Focused Strategies Activated.  Acceptance & Commitment Therapy Introduced. Brief Cognitive Behavioral Therapy Performed. Client-Centered Therapy Initiated. Continued Review of The Following List of Agencies, Guys with Daughter, [name] to Ensure Understanding: ~ [place]          ~ [place]          ~ Adult Day Care Programs          ~ Senior Resources of [employer]          ~ [place]   ~ 2023 Medicaid Tips   ~ SPX Corporation   ~ Instructions For How to Apply for American Financial   ~ [place] Instructions ~ [place] with Daughter, [name] to Assist with Completion of Medicaid Application & Encourage Submission to The [place] [phone]), for Processing. CSW Collaboration with Daughter, [name] to Eli Lilly and Company with Agencies, Lubrizol Corporation of Interest, from List Provided. CSW Collaboration with Daughter, [name] to Automatic Data of Handicapped Accessible Wheelchair Ramp to Corning Incorporated. ~ One Set of 2 Steps to WESCO International. ~ Bilateral Kelly Services Already [place]. ~ Pictures & Measurements Obtained. ~ Requiring  Financial Assistance Due to Fixed Income. CSW Collaboration with Daughter, [name] to Review List  of [place] with [place], Emailed on 02/28/2022: ~ Aging, [place] of [place] (# [phone])    ~ Armen Pickup [phone])   ~ Benjamine Sprague (# [phone]) ~ [place] RAMMP - [place] (# [phone]) ~ Ramping It Up - Tech Data Corporation [phone]) [place] with Daughter, [name] to Encourage Contact with [place], with [facility] [phone]), During Next Scheduled Home Visit, to Inquire About Recommendations for Handicapped Public librarian.  CSW Collaboration with Valente David, Nurse Case Manager with [place] [employer] [phone]), to Request Telephone Outreach Call to Daughter, [name] to Review Diuretics, Per Ms. Bari Edward Request. CSW Collaboration with Daughter, [name] to Remind Her of Patient's Follow-Up Appointment with Primary Care Provider, Dr. Sallee Lange with [place] [phone]), Scheduled on 03/09/2022 at 11:20 AM. CSW Collaboration with Daughter, [name] to Confirm the Following Durable Medical Equipment in The Home: ~ Gilford Rile   ~ Wheelchair ~ [place] ~ [place] ~ [place] ~ Cleda Mccreedy in [place] with Daughter, [name] to Request She Hold Off on Completing Application for [place], through [employer] [phone]), Until Approved for Medicaid, through The [place] [phone]). CSW Collaboration with Daughter, [name] to Request She Have Her Sister, Diaz Matusik Contact CSW Directly [phone]# [phone]), to Provide Name & [place], Paid For Through NiSource, While Patient Was Residing with Ms. Alford [place]. ~ Received 8 Hours of In-Home Care Services Per Week, Through [employer]. CSW Collaboration with Daughter, [name] to Nordstrom of Transportation Benefit through Dana Corporation (# [phone]), Free to [employer]. Continue to [facility] [facility], Occupational Therapy, [place], through [facility] [phone]).      Our next appointment is by telephone on 03/19/2022 at 3:45 pm.   Please call the care guide team at [phone] if you need to cancel or reschedule your appointment.   If you are experiencing a Mental Health or Hurt or need someone to talk to, please call the Suicide and Crisis Lifeline: 988 call the [place] National Suicide Prevention Lifeline: 240-051-7241 or TTY: 202-749-5263 TTY 640-643-5759) to talk to a trained counselor call 1-800-273-TALK (toll free, 24 hour hotline) go to [facility] [address] [place] [phone]) call the [place]: [phone] call 911  Patient verbalizes understanding of instructions and care plan provided today and agrees to view in [place]. Active MyChart status and patient understanding of how to access instructions and care plan via MyChart confirmed with patient.     Telephone follow up appointment with care management team member scheduled for:  03/19/2022 at 3:45 pm.  Nat Christen, BSW, MSW, [place]  Licensed Clinical Social Worker  [place]  Mailing [place]. [address] [place], [place] [address] Physical Address-[address] [place], [place] [address] Toll Free Main #  [phone] Fax # [phone] Cell # [phone] [name]

## 2022-03-18 NOTE — Patient Outreach (Signed)
Care Coordination   Follow Up Visit Note   03/16/2022 - Late Entry  Name: [name] MRN: [id number] DOB: Jul 16, 1940  [name] is a 82 y.o. year old male who sees Luking, Elayne Snare, MD for primary care. I spoke with patient's daughter, [name] by phone today.  What matters to the patients health and wellness today?  Receive Assistance Obtaining In-Home Care Services.   Goals Addressed             This Visit's Progress    Receive Assistance Obtaining In-Home Care Services.   On track    Care Coordination Interventions:  Interventions Today    Flowsheet Row Most Recent Value  Chronic Disease Discussed/Reviewed   Chronic disease discussed/reviewed during today's visit Chronic Kidney Disease/End Stage Renal Disease (ESRD)  General Interventions   General Interventions Discussed/Reviewed General Interventions Discussed, General Interventions Reviewed, Durable Medical Equipment (DME), Health Screening, Community Resources, Doctor Visits  Doctor Visits Discussed/Reviewed Doctor Visits Discussed, Doctor Visits Reviewed, Annual Wellness Visits, PCP  Durable Medical Equipment (DME) Bed side commode, BP Cuff, Environmental consultant, Wheelchair, Estate agent  PCP/Specialist Visits Compliance with follow-up visit  Exercise Interventions   Exercise Discussed/Reviewed Exercise Discussed, Exercise Reviewed, Weight Managment, Assistive device use and maintanence  Weight Management Weight loss  Education Interventions   Education Provided Provided Verbal Education  Provided Verbal Education On Nutrition, Mental Health/Coping with Illness, Exercise, Medication, When to see the doctor  Mental Health Interventions   [name] Discussed, Mental Health Reviewed, Coping Strategies  Nutrition Interventions   Nutrition Discussed/Reviewed Nutrition Discussed, Nutrition Reviewed, Portion sizes, Decreasing sugar intake, Increaing proteins,  Decreasing fats, Decreasing salt  Pharmacy Interventions   Pharmacy Dicussed/Reviewed Medication Adherence, Affording Medications  Safety Interventions   Safety Discussed/Reviewed Safety Discussed, Safety Reviewed, Fall Risk, Home Safety  Home Safety Assistive Devices, Need for home safety assessment, Refer for community resources      Active Listening & Reflection Utilized.  Verbalization of Feelings Encouraged.  Emotional & Caregiver Support Provided. Caregiver Stress Acknowledged. Caregiver Resources Reviewed. Self-Enrollment in Caregiver Support Group Emphasized, From List Provided. Reminded of Crisis Support Information, Liberty Media, From List Provided. Problem Solving Interventions Enacted. Task-Centered Solutions Employed.  Solution-Focused Strategies Activated.  Acceptance & Commitment Therapy Introduced. Brief Cognitive Behavioral Therapy Performed. Client-Centered Therapy Initiated. Continued Review of The Following List of Agencies, [place] with Daughter, [name] to Ensure Understanding: ~ [place]          ~ [place]          ~ Adult Day Care Programs          ~ Senior Resources of Boston Scientific          ~ [place]   ~ 2023 Medicaid Tips   ~ SPX Corporation   ~ Instructions For How to Apply for American Financial   ~ [place] Instructions ~ [place] with Daughter, [name] to Assist with Completion of Medicaid Application & Encourage Submission to The [place] [phone]), for Processing. CSW Collaboration with Daughter, [name] to Eli Lilly and Company with Agencies, Lubrizol Corporation of Interest, from List Provided. CSW Collaboration with Daughter, [name] to Auto-Owners Insurance of Handicapped Accessible Wheelchair Ramp to Corning Incorporated. ~ One Set of 2  Steps to Automatic Data of Home. ~ Bilateral Hand Rails Already [place]. ~ Pictures & Measurements Obtained. ~ Requiring Financial Assistance Due to Fixed Income. CSW Collaboration with Daughter, [name] to Review List of [place] with [place], Emailed on 02/28/2022: ~ Aging, [place] of [place] (# [phone])    ~ Armen Pickup [phone])   ~ Benjamine Sprague (# [phone]) ~ [place] RAMMP - [place] (# [phone]) ~ Ramping It Up - Tech Data Corporation [phone]) [place] with Daughter, [name] to Encourage Contact with [place], with [facility] [phone]), During Next Scheduled Home Visit, to Inquire About Recommendations for Handicapped Public librarian.  CSW Collaboration with [name], Nurse Case Manager with [place] Management [phone]), to Request Telephone Outreach Call to Daughter, [name] to Review Diuretics, Per [name] Request. CSW Collaboration with Daughter, [name] to Remind Her of Patient's Follow-Up Appointment with Primary Care Provider, [name] with [place] [phone]), Scheduled on 03/09/2022 at 11:20 AM. CSW Collaboration with Daughter, [name] to Confirm the Following Durable Medical Equipment in The Home: ~ Gilford Rile   ~ Wheelchair ~ [place] ~ [place] ~ [place] ~ Cleda Mccreedy in [place] with Daughter, [name] to Request She Hold Off on Completing Application for [place], through [employer] [phone]), Until Approved for Medicaid, through The [place] [phone]). CSW Collaboration with Daughter, [name] to Request She Have Her Sister, [name] Contact CSW Directly [phone]# [phone]), to Provide Name & [place], Paid For Through [employer], While Patient Was Residing with Ms. [name]. ~ Received 8 Hours of In-Home Care Services Per Week, Through [employer]. CSW Collaboration with Daughter, [name] to Nordstrom of Transportation Benefit through Dana Corporation (# [phone]), Free to [employer] 1 [employer]. Continue to [place] [facility], Occupational Therapy, [place], through [facility] [phone]).      SDOH assessments and interventions completed:  Yes.  Care Coordination Interventions:  Yes, provided.   Follow up plan: Follow up call scheduled for 03/19/2022 at 3:45 pm.  Encounter Outcome:  Pt. Visit Completed.   Nat Christen, BSW, MSW, LCSW  Licensed Education officer, environmental Health System  Mailing [place] [address], [place], [place] [address] Physical Address-[address] [place],  [address] Toll Free Main # [phone] Fax # [phone] Cell # [phone] [name]

## 2022-03-19 ENCOUNTER — Encounter: Payer: Self-pay | Admitting: *Deleted

## 2022-03-19 ENCOUNTER — Ambulatory Visit: Payer: Self-pay | Admitting: *Deleted

## 2022-03-19 DIAGNOSIS — M6281 Muscle weakness (generalized): Secondary | ICD-10-CM | POA: Diagnosis not present

## 2022-03-19 DIAGNOSIS — I509 Heart failure, unspecified: Secondary | ICD-10-CM | POA: Diagnosis not present

## 2022-03-19 DIAGNOSIS — I2510 Atherosclerotic heart disease of native coronary artery without angina pectoris: Secondary | ICD-10-CM | POA: Diagnosis not present

## 2022-03-19 NOTE — Patient Outreach (Signed)
Care Coordination   Follow Up Visit Note   03/19/2022  Name: Underwood Chris MRN: [id number] DOB: 1940/03/25  [name] is a 82 y.o. year old adult who sees Luking, Elayne Snare, MD for primary care. I spoke with patient's daughter's, Janith Lima and [name] by phone today.  What matters to the patients health and wellness today?  Receive Assistance Obtaining In-Home Care Services.   Goals Addressed             This Visit's Progress    Receive Assistance Obtaining In-Home Care Services.   On track    Care Coordination Interventions:  Interventions Today    Flowsheet Row Most Recent Value  Chronic Disease Discussed/Reviewed   Chronic disease discussed/reviewed during today's visit Chronic Kidney Disease/End Stage Renal Disease (ESRD)  General Interventions   General Interventions Discussed/Reviewed General Interventions Discussed, General Interventions Reviewed, Durable Medical Equipment (DME), Health Screening, Community Resources, Doctor Visits  Doctor Visits Discussed/Reviewed Doctor Visits Discussed, Doctor Visits Reviewed, Annual Wellness Visits, PCP  Durable Medical Equipment (DME) Bed side commode, BP Cuff, Environmental consultant, Wheelchair, Estate agent  PCP/Specialist Visits Compliance with follow-up visit  Exercise Interventions   Exercise Discussed/Reviewed Exercise Discussed, Exercise Reviewed, Weight Managment, Assistive device use and maintanence  Weight Management Weight loss  Education Interventions   Education Provided Provided Verbal Education  Provided Verbal Education On Nutrition, Mental Health/Coping with Illness, Exercise, Medication, When to see the doctor  Mental Health Interventions   [place] Discussed, Mental Health Reviewed, Coping Strategies  Nutrition Interventions   Nutrition Discussed/Reviewed Nutrition Discussed, Nutrition Reviewed, Portion sizes, Decreasing sugar intake, Increaing  proteins, Decreasing fats, Decreasing salt  Pharmacy Interventions   Pharmacy Dicussed/Reviewed Medication Adherence, Affording Medications  Safety Interventions   Safety Discussed/Reviewed Safety Discussed, Safety Reviewed, Fall Risk, Home Safety  Home Safety Assistive Devices, Need for home safety assessment, Refer for community resources      Active Listening & Reflection Utilized.  Verbalization of Feelings Encouraged.  Emotional & Caregiver Support Provided. Caregiver Stress Acknowledged. Caregiver Resources Reviewed. Self-Enrollment in Caregiver Support Group of Interest Emphasized, from List Provided. Crisis Support Information, Agencies & Resources Reviewed, from List Provided. Problem Solving Interventions Enacted. Task-Centered Solutions Employed.  Solution-Focused Strategies Activated.  Acceptance & Commitment Therapy Performed. Cognitive Behavioral Therapy Initiated. Client-Centered Therapy Implemented. CSW Collaboration with Representative from [place] [phone]), to [name] for Medicaid Benefits. CSW Collaboration with Daughter's, [name] & [name] to Explain Patient's Ineligibility for Medicaid, through The [place] [phone]), Due to Social Security Income Exceeding Poverty Guidelines for The [place] of [place], for [employer] 2024. CSW Collaboration with Daughter's, [name] & [name] to [place] Verbal Consent to Place Referral to Aging, [place] of [place] [phone]), for [facility]. CSW Collaboration with Representative from [place], [place] of [place] [phone]), to Place Referral for [facility] for Patient. CSW Environmental education officer from [place], [place] of [place] [phone]), to Place Referral for Electronic Data Systems of Handicapped Accessible Wheelchair Ramp to Corning Incorporated for Patient & Provided Research scientist (medical). ~ One Set of 2 Steps to WESCO International. ~ Bilateral Kelly Services Already [place]. ~ Pictures & Measurements Obtained. ~ Requiring Financial Assistance Due to Fixed Income. CSW Collaboration with Daughter's, [place] to [employer]els  to Keep the Following List of Agencies, Lubrizol Corporation, in The Event That Patient is Ineligible to Anheuser-Busch Through [place] of [place] [phone]):                                  ~ [place]                  ~ [place]                  ~ Adult Day Care Programs                  ~ Senior Resources of [place]                  ~ [place] with Daughter's, Photographer Underwood & [name] to Review List of [place] with Occupational hygienist, in The Event That patient is Ineligible to E. I. du Pont through [place], [place] of [place] (# [phone]):           ~ Armen Pickup [phone])           ~ Benjamine Sprague (# [phone])  ~ [place] (# [phone])  ~ Ramping It Up - Tech Data Corporation [phone]) [place] with Daughter's, [name] & [name] to Confirm That Patient is Still Receiving [place], through [facility] [phone]# [phone]).  CSW Collaboration with Daughter's, [name] & [name] to Nordstrom of Transportation Benefit through Dana Corporation (# [phone]), Free to Edison International.       SDOH assessments and interventions completed:  Yes.  Care Coordination Interventions:  Yes, provided.   Follow up plan: Follow up call scheduled for 03/28/2022 at 3:00 pm.   Encounter Outcome:  Pt. Visit Completed.   Nat Christen, BSW, MSW, LCSW  Licensed Education officer, environmental Health System  Mailing [place] [address], [place], [place] [address] Physical Address-[address] [place], [place] [address] Toll Free Main # [phone] Fax # [phone] Cell # [phone] [name]

## 2022-03-19 NOTE — Patient Instructions (Signed)
Visit Information  Thank you for taking time to visit with me today. Please don't hesitate to contact me if I can be of assistance to you.   Following are the goals we discussed today:   Goals Addressed             This Visit's Progress    Receive Assistance Obtaining In-Home Care Services.   On track    Care Coordination Interventions:  Interventions Today    Flowsheet Row Most Recent Value  Chronic Disease Discussed/Reviewed   Chronic disease discussed/reviewed during today's visit Chronic Kidney Disease/End Stage Renal Disease (ESRD)  General Interventions   General Interventions Discussed/Reviewed General Interventions Discussed, General Interventions Reviewed, Durable Medical Equipment (DME), Health Screening, Community Resources, Doctor Visits  Doctor Visits Discussed/Reviewed Doctor Visits Discussed, Doctor Visits Reviewed, Annual Wellness Visits, PCP  Durable Medical Equipment (DME) Bed side commode, BP Cuff, Environmental consultant, Wheelchair, Estate agent  PCP/Specialist Visits Compliance with follow-up visit  Exercise Interventions   Exercise Discussed/Reviewed Exercise Discussed, Exercise Reviewed, Weight Managment, Assistive device use and maintanence  Weight Management Weight loss  Education Interventions   Education Provided Provided Verbal Education  Provided Verbal Education On Nutrition, Mental Health/Coping with Illness, Exercise, Medication, When to see the doctor  Mental Health Interventions   [place] Discussed, Mental Health Reviewed, Coping Strategies  Nutrition Interventions   Nutrition Discussed/Reviewed Nutrition Discussed, Nutrition Reviewed, Portion sizes, Decreasing sugar intake, Increaing proteins, Decreasing fats, Decreasing salt  Pharmacy Interventions   Pharmacy Dicussed/Reviewed Medication Adherence, Affording Medications  Safety Interventions   Safety Discussed/Reviewed Safety Discussed, Safety  Reviewed, Fall Risk, Home Safety  Home Safety Assistive Devices, Need for home safety assessment, Refer for community resources      Active Listening & Reflection Utilized.  Verbalization of Feelings Encouraged.  Emotional & Caregiver Support Provided. Caregiver Stress Acknowledged. Caregiver Resources Reviewed. Self-Enrollment in Caregiver Support Group of Interest Emphasized, from List Provided. Crisis Support Information, Agencies & Resources Reviewed, from List Provided. Problem Solving Interventions Enacted. Task-Centered Solutions Employed.  Solution-Focused Strategies Activated.  Acceptance & Commitment Therapy Performed. Cognitive Behavioral Therapy Initiated. Client-Centered Therapy Implemented. CSW Collaboration with Representative from [place] [phone]), to Patrina Levering for Medicaid Benefits. CSW Collaboration with Daughter's, [name] Cozart & [name] to Explain Patient's Ineligibility for Medicaid, through [employer] [place] [phone]), Due to Social Security Income Exceeding Poverty Guidelines for [place] of [place], for [employer] 2024. CSW Collaboration with Daughter's, [name] Cozart & [name] to [place] Verbal Consent to Place Referral to Aging, [place] of [place] [phone]), for [facility]. CSW Collaboration with Representative from [place], [place] of [place] [phone]), to Place Referral for [facility] for Patient. CSW Environmental education officer from [place], [place] of [place] [phone]), to Place Referral for [employer] of Handicapped Accessible Wheelchair Ramp to [employer] for Patient & Provided Research scientist (medical). ~ One Set of 2 Steps to [employer]. ~ Bilateral Kelly Services Already  [place]. ~ Pictures & Measurements Obtained. ~ Requiring Financial Assistance Due to Fixed Income. CSW Collaboration with Daughter's, [place] to [employer] to Keep the Following List of Agencies, [employer], in The Event That Patient is Ineligible to [employer] Through Aging, [place] of [place] [phone]): ~  [place]                 ~ [place]                 ~ Adult Day Care Programs                 ~ Senior Resources of [place]                 ~ [place] with Daughter's, Photographer Cozart & [name] to Review List of [place] with Occupational hygienist, in The Event That patient is Ineligible to [employer] through [place], [place] of [place] (# [phone]):         ~ Armen Pickup [phone])         ~ Benjamine Sprague (# [phone])        ~ [place] (#              [phone])        ~ Ramping It Up - [employer] [phone]) [place] with Daughter's, [name] Cozart & [name] to Confirm That Patient is Still Receiving [place], through [facility] [phone]).  CSW Collaboration with Daughter's, [name] Cozart & [name] to [employer] of Transportation Benefit through [employer] (# [phone]), Free to [employer].       Our next appointment is by telephone on 03/28/2022 at 3:00 pm.  Please call the care guide team at [phone] if you need to cancel or reschedule your appointment.   If you are experiencing a Mental Health or [place] or need someone to talk to, please call the Suicide and Crisis Lifeline: 988 call the [place] National Suicide Prevention Lifeline: 6696116853 or TTY: 534-256-5244 TTY (218) 305-3228) to talk to a trained counselor call 1-800-273-TALK (toll free, 24 hour hotline) go to [facility] Urgent Care [address], [place] [phone]) call the [place]: [phone] call 911  Patient verbalizes understanding of instructions and care plan provided today and agrees to view in [place]. Active MyChart status and patient understanding of how to access instructions and care plan via MyChart confirmed with patient.     Telephone follow up appointment with care management team member scheduled for:  03/28/2022 at 3:00 pm.  Nat Christen, BSW, MSW, [place]  Licensed Clinical Social Worker  [place]  Mailing [place]. [address], [place], [place] [address] Physical Address-[address] [place],  [address] Toll Free Main # [phone] Fax # [phone] Cell # [phone] [name]

## 2022-03-20 DIAGNOSIS — E785 Hyperlipidemia, unspecified: Secondary | ICD-10-CM | POA: Diagnosis not present

## 2022-03-20 DIAGNOSIS — I2510 Atherosclerotic heart disease of native coronary artery without angina pectoris: Secondary | ICD-10-CM | POA: Diagnosis not present

## 2022-03-20 DIAGNOSIS — H9193 Unspecified hearing loss, bilateral: Secondary | ICD-10-CM | POA: Diagnosis not present

## 2022-03-20 DIAGNOSIS — Z7951 Long term (current) use of inhaled steroids: Secondary | ICD-10-CM | POA: Diagnosis not present

## 2022-03-20 DIAGNOSIS — R338 Other retention of urine: Secondary | ICD-10-CM | POA: Diagnosis not present

## 2022-03-20 DIAGNOSIS — R1314 Dysphagia, pharyngoesophageal phase: Secondary | ICD-10-CM | POA: Diagnosis not present

## 2022-03-20 DIAGNOSIS — Z87891 Personal history of nicotine dependence: Secondary | ICD-10-CM | POA: Diagnosis not present

## 2022-03-20 DIAGNOSIS — Z9981 Dependence on supplemental oxygen: Secondary | ICD-10-CM | POA: Diagnosis not present

## 2022-03-20 DIAGNOSIS — I130 Hypertensive heart and chronic kidney disease with heart failure and stage 1 through stage 4 chronic kidney disease, or unspecified chronic kidney disease: Secondary | ICD-10-CM | POA: Diagnosis not present

## 2022-03-20 DIAGNOSIS — F32A Depression, unspecified: Secondary | ICD-10-CM | POA: Diagnosis not present

## 2022-03-20 DIAGNOSIS — J9621 Acute and chronic respiratory failure with hypoxia: Secondary | ICD-10-CM | POA: Diagnosis not present

## 2022-03-20 DIAGNOSIS — I5033 Acute on chronic diastolic (congestive) heart failure: Secondary | ICD-10-CM | POA: Diagnosis not present

## 2022-03-20 DIAGNOSIS — Z9181 History of falling: Secondary | ICD-10-CM | POA: Diagnosis not present

## 2022-03-20 DIAGNOSIS — N1831 Chronic kidney disease, stage 3a: Secondary | ICD-10-CM | POA: Diagnosis not present

## 2022-03-20 DIAGNOSIS — J440 Chronic obstructive pulmonary disease with acute lower respiratory infection: Secondary | ICD-10-CM | POA: Diagnosis not present

## 2022-03-20 DIAGNOSIS — J441 Chronic obstructive pulmonary disease with (acute) exacerbation: Secondary | ICD-10-CM | POA: Diagnosis not present

## 2022-03-20 DIAGNOSIS — G4733 Obstructive sleep apnea (adult) (pediatric): Secondary | ICD-10-CM | POA: Diagnosis not present

## 2022-03-21 ENCOUNTER — Ambulatory Visit: Payer: Self-pay | Admitting: *Deleted

## 2022-03-21 ENCOUNTER — Encounter: Payer: Self-pay | Admitting: *Deleted

## 2022-03-21 ENCOUNTER — Telehealth: Payer: Self-pay

## 2022-03-21 DIAGNOSIS — G4733 Obstructive sleep apnea (adult) (pediatric): Secondary | ICD-10-CM | POA: Diagnosis not present

## 2022-03-21 NOTE — Patient Outreach (Signed)
  Care Coordination   03/21/2022 Name: [name] MRN: [id number] DOB: December 09, 1940   Care Coordination Outreach Attempts:  An unsuccessful telephone outreach was attempted today to offer the patient information about available care coordination services as a benefit of their health plan.   Follow Up Plan:  Additional outreach attempts will be made to offer the patient care coordination information and services.   Encounter Outcome:  No Answer   Care Coordination Interventions:  No, not indicated    Harue Pribble L. Lavina Hamman, RN, BSN, [place] Coordinator Office number [phone]

## 2022-03-21 NOTE — Patient Outreach (Signed)
  Care Coordination   Initial Visit Note   03/21/2022 Name: [name] MRN: [id number] DOB: 09/07/1940  [name] is a 82 y.o. year old adult who sees Luking, Elayne Snare, MD for primary care. I spoke with  [name] by phone today.  What matters to the patients health and wellness today? Clarified with daughter that RN CM was not schedule for a home visit  congestive Heart Failure (CHF) [name] reports administering vitamin "D" 2 or 3 Increased fluids and lowering of hydralazine dosage Weight being completed daily - Today=303.2    Hypertension [name] reports [name]'s BP has been managed since [birth year]/15/35 after systolic values of 169-678  [name] reports decreases in systolic to 938-101 and diastolic in the 75-10C She confirms she returned to give the patient hydralazine 1/2  tablets bid vs 1/2 tablet 3 times daily Also providing patient his vitamin Now has increase intake of fluids to prevent dehydration  [name] confirms [name] is now receiving [facility] services [name] PT called to visit today  Seen by pcp 03/09/22 Confirmed that a pcp follow up visit on 03/26/22 needs to be changed to a later date. (30 days)   Goals Addressed               This Visit's Progress     Patient Stated     Effective management of CHF (pt-stated)   On track     Care Coordination Interventions: Basic overview and discussion of pathophysiology of Heart Failure reviewed Provided education on low sodium diet Reviewed role of diuretics in prevention of fluid overload and management of heart failure; Discussed the importance of keeping all appointments with provider Assessed for worsening CHF symptoms       Interventions Today    Flowsheet Row Most Recent Value  Chronic Disease   Chronic disease during today's visit Congestive Heart Failure (CHF)  General Interventions   General Interventions Discussed/Reviewed General Interventions Discussed, Sick  Day Rules, [employer], Doctor Visits  Doctor Visits Discussed/Reviewed Doctor Visits Discussed, PCP        SDOH assessments and interventions completed:  Yes  SDOH Interventions Today    Flowsheet Row Most Recent Value  SDOH Interventions   Food Insecurity Interventions Intervention Not Indicated  Housing Interventions Intervention Not Indicated  Transportation Interventions Intervention Not Indicated  Utilities Interventions Intervention Not Indicated  Financial Strain Interventions Intervention Not Indicated        Care Coordination Interventions:  Yes, provided   Follow up plan: Follow up call scheduled for 04/04/22    Encounter Outcome:  Pt. Visit Completed   [name] L. Chris Hamman, RN, BSN, [place] Coordinator Office number [phone]

## 2022-03-21 NOTE — Patient Instructions (Addendum)
Visit Information  Thank you for taking time to visit with me today. Please don't hesitate to contact me if I can be of assistance to you.   Following are the goals we discussed today:   Goals Addressed               This Visit's Progress     Patient Stated     Effective management of CHF (pt-stated)   On track     Care Coordination Interventions: Basic overview and discussion of pathophysiology of Heart Failure reviewed Provided education on low sodium diet Reviewed role of diuretics in prevention of fluid overload and management of heart failure; Discussed the importance of keeping all appointments with provider Assessed for worsening CHF symptoms        Our next appointment is by telephone on 04/04/22 at 0930  Please call the care guide team at [phone] if you need to cancel or reschedule your appointment.   If you are experiencing a Mental Health or [place] or need someone to talk to, please call the Suicide and Crisis Lifeline: 988 call the [place] National Suicide Prevention Lifeline: 518-447-6295 or TTY: 212-393-0501 TTY 303 644 5712) to talk to a trained counselor call 1-800-273-TALK (toll free, 24 hour hotline) call the [facility]: [phone] call 911   Patient verbalizes understanding of instructions and care plan provided today and agrees to view in [place]. Active MyChart status and patient understanding of how to access instructions and care plan via MyChart confirmed with patient.     The patient has been provided with contact information for the care management team and has been advised to call with any health related questions or concerns.    Macon Sandiford L. Lavina Hamman, RN, BSN, [place] Coordinator Office number [phone]

## 2022-03-21 NOTE — Telephone Encounter (Signed)
Patient daughter , [name] called because there was an appt reminder for 2/19 she was under the impression she was to follow up in a month and it wasn't changed due to computer being down at the time of appt. Can you please schedule for around march 2nd around 1 pm . thanks

## 2022-03-22 DIAGNOSIS — I130 Hypertensive heart and chronic kidney disease with heart failure and stage 1 through stage 4 chronic kidney disease, or unspecified chronic kidney disease: Secondary | ICD-10-CM | POA: Diagnosis not present

## 2022-03-22 DIAGNOSIS — G4733 Obstructive sleep apnea (adult) (pediatric): Secondary | ICD-10-CM | POA: Diagnosis not present

## 2022-03-22 DIAGNOSIS — Z9981 Dependence on supplemental oxygen: Secondary | ICD-10-CM | POA: Diagnosis not present

## 2022-03-22 DIAGNOSIS — F32A Depression, unspecified: Secondary | ICD-10-CM | POA: Diagnosis not present

## 2022-03-22 DIAGNOSIS — Z9181 History of falling: Secondary | ICD-10-CM | POA: Diagnosis not present

## 2022-03-22 DIAGNOSIS — I5033 Acute on chronic diastolic (congestive) heart failure: Secondary | ICD-10-CM | POA: Diagnosis not present

## 2022-03-22 DIAGNOSIS — Z7951 Long term (current) use of inhaled steroids: Secondary | ICD-10-CM | POA: Diagnosis not present

## 2022-03-22 DIAGNOSIS — J9621 Acute and chronic respiratory failure with hypoxia: Secondary | ICD-10-CM | POA: Diagnosis not present

## 2022-03-22 DIAGNOSIS — H9193 Unspecified hearing loss, bilateral: Secondary | ICD-10-CM | POA: Diagnosis not present

## 2022-03-22 DIAGNOSIS — R1314 Dysphagia, pharyngoesophageal phase: Secondary | ICD-10-CM | POA: Diagnosis not present

## 2022-03-22 DIAGNOSIS — J440 Chronic obstructive pulmonary disease with acute lower respiratory infection: Secondary | ICD-10-CM | POA: Diagnosis not present

## 2022-03-22 DIAGNOSIS — N1831 Chronic kidney disease, stage 3a: Secondary | ICD-10-CM | POA: Diagnosis not present

## 2022-03-22 DIAGNOSIS — R0902 Hypoxemia: Secondary | ICD-10-CM | POA: Diagnosis not present

## 2022-03-22 DIAGNOSIS — J441 Chronic obstructive pulmonary disease with (acute) exacerbation: Secondary | ICD-10-CM | POA: Diagnosis not present

## 2022-03-22 DIAGNOSIS — E785 Hyperlipidemia, unspecified: Secondary | ICD-10-CM | POA: Diagnosis not present

## 2022-03-22 DIAGNOSIS — R338 Other retention of urine: Secondary | ICD-10-CM | POA: Diagnosis not present

## 2022-03-22 DIAGNOSIS — I2510 Atherosclerotic heart disease of native coronary artery without angina pectoris: Secondary | ICD-10-CM | POA: Diagnosis not present

## 2022-03-22 DIAGNOSIS — J9601 Acute respiratory failure with hypoxia: Secondary | ICD-10-CM | POA: Diagnosis not present

## 2022-03-22 DIAGNOSIS — Z87891 Personal history of nicotine dependence: Secondary | ICD-10-CM | POA: Diagnosis not present

## 2022-03-23 DIAGNOSIS — N1831 Chronic kidney disease, stage 3a: Secondary | ICD-10-CM | POA: Diagnosis not present

## 2022-03-23 DIAGNOSIS — J441 Chronic obstructive pulmonary disease with (acute) exacerbation: Secondary | ICD-10-CM | POA: Diagnosis not present

## 2022-03-23 DIAGNOSIS — I5033 Acute on chronic diastolic (congestive) heart failure: Secondary | ICD-10-CM | POA: Diagnosis not present

## 2022-03-23 DIAGNOSIS — Z7951 Long term (current) use of inhaled steroids: Secondary | ICD-10-CM | POA: Diagnosis not present

## 2022-03-23 DIAGNOSIS — E785 Hyperlipidemia, unspecified: Secondary | ICD-10-CM | POA: Diagnosis not present

## 2022-03-23 DIAGNOSIS — Z9981 Dependence on supplemental oxygen: Secondary | ICD-10-CM | POA: Diagnosis not present

## 2022-03-23 DIAGNOSIS — G4733 Obstructive sleep apnea (adult) (pediatric): Secondary | ICD-10-CM | POA: Diagnosis not present

## 2022-03-23 DIAGNOSIS — R338 Other retention of urine: Secondary | ICD-10-CM | POA: Diagnosis not present

## 2022-03-23 DIAGNOSIS — I2510 Atherosclerotic heart disease of native coronary artery without angina pectoris: Secondary | ICD-10-CM | POA: Diagnosis not present

## 2022-03-23 DIAGNOSIS — R1314 Dysphagia, pharyngoesophageal phase: Secondary | ICD-10-CM | POA: Diagnosis not present

## 2022-03-23 DIAGNOSIS — I130 Hypertensive heart and chronic kidney disease with heart failure and stage 1 through stage 4 chronic kidney disease, or unspecified chronic kidney disease: Secondary | ICD-10-CM | POA: Diagnosis not present

## 2022-03-23 DIAGNOSIS — J440 Chronic obstructive pulmonary disease with acute lower respiratory infection: Secondary | ICD-10-CM | POA: Diagnosis not present

## 2022-03-23 DIAGNOSIS — J9621 Acute and chronic respiratory failure with hypoxia: Secondary | ICD-10-CM | POA: Diagnosis not present

## 2022-03-23 DIAGNOSIS — Z87891 Personal history of nicotine dependence: Secondary | ICD-10-CM | POA: Diagnosis not present

## 2022-03-23 DIAGNOSIS — F32A Depression, unspecified: Secondary | ICD-10-CM | POA: Diagnosis not present

## 2022-03-23 DIAGNOSIS — H9193 Unspecified hearing loss, bilateral: Secondary | ICD-10-CM | POA: Diagnosis not present

## 2022-03-23 DIAGNOSIS — Z9181 History of falling: Secondary | ICD-10-CM | POA: Diagnosis not present

## 2022-03-26 ENCOUNTER — Ambulatory Visit: Payer: Medicare Other | Admitting: Family Medicine

## 2022-03-26 DIAGNOSIS — R338 Other retention of urine: Secondary | ICD-10-CM | POA: Diagnosis not present

## 2022-03-26 DIAGNOSIS — G4733 Obstructive sleep apnea (adult) (pediatric): Secondary | ICD-10-CM | POA: Diagnosis not present

## 2022-03-26 DIAGNOSIS — I130 Hypertensive heart and chronic kidney disease with heart failure and stage 1 through stage 4 chronic kidney disease, or unspecified chronic kidney disease: Secondary | ICD-10-CM | POA: Diagnosis not present

## 2022-03-26 DIAGNOSIS — J441 Chronic obstructive pulmonary disease with (acute) exacerbation: Secondary | ICD-10-CM | POA: Diagnosis not present

## 2022-03-26 DIAGNOSIS — I5033 Acute on chronic diastolic (congestive) heart failure: Secondary | ICD-10-CM | POA: Diagnosis not present

## 2022-03-26 DIAGNOSIS — Z87891 Personal history of nicotine dependence: Secondary | ICD-10-CM | POA: Diagnosis not present

## 2022-03-26 DIAGNOSIS — J9621 Acute and chronic respiratory failure with hypoxia: Secondary | ICD-10-CM | POA: Diagnosis not present

## 2022-03-26 DIAGNOSIS — F32A Depression, unspecified: Secondary | ICD-10-CM | POA: Diagnosis not present

## 2022-03-26 DIAGNOSIS — E785 Hyperlipidemia, unspecified: Secondary | ICD-10-CM | POA: Diagnosis not present

## 2022-03-26 DIAGNOSIS — Z9981 Dependence on supplemental oxygen: Secondary | ICD-10-CM | POA: Diagnosis not present

## 2022-03-26 DIAGNOSIS — R1314 Dysphagia, pharyngoesophageal phase: Secondary | ICD-10-CM | POA: Diagnosis not present

## 2022-03-26 DIAGNOSIS — Z7951 Long term (current) use of inhaled steroids: Secondary | ICD-10-CM | POA: Diagnosis not present

## 2022-03-26 DIAGNOSIS — Z9181 History of falling: Secondary | ICD-10-CM | POA: Diagnosis not present

## 2022-03-26 DIAGNOSIS — N1831 Chronic kidney disease, stage 3a: Secondary | ICD-10-CM | POA: Diagnosis not present

## 2022-03-26 DIAGNOSIS — H9193 Unspecified hearing loss, bilateral: Secondary | ICD-10-CM | POA: Diagnosis not present

## 2022-03-26 DIAGNOSIS — J440 Chronic obstructive pulmonary disease with acute lower respiratory infection: Secondary | ICD-10-CM | POA: Diagnosis not present

## 2022-03-26 DIAGNOSIS — I2510 Atherosclerotic heart disease of native coronary artery without angina pectoris: Secondary | ICD-10-CM | POA: Diagnosis not present

## 2022-03-27 ENCOUNTER — Ambulatory Visit: Payer: Self-pay | Admitting: *Deleted

## 2022-03-27 ENCOUNTER — Encounter: Payer: Self-pay | Admitting: *Deleted

## 2022-03-27 DIAGNOSIS — E785 Hyperlipidemia, unspecified: Secondary | ICD-10-CM | POA: Diagnosis not present

## 2022-03-27 DIAGNOSIS — Z9981 Dependence on supplemental oxygen: Secondary | ICD-10-CM | POA: Diagnosis not present

## 2022-03-27 DIAGNOSIS — F32A Depression, unspecified: Secondary | ICD-10-CM | POA: Diagnosis not present

## 2022-03-27 DIAGNOSIS — G4733 Obstructive sleep apnea (adult) (pediatric): Secondary | ICD-10-CM | POA: Diagnosis not present

## 2022-03-27 DIAGNOSIS — R338 Other retention of urine: Secondary | ICD-10-CM | POA: Diagnosis not present

## 2022-03-27 DIAGNOSIS — Z9181 History of falling: Secondary | ICD-10-CM | POA: Diagnosis not present

## 2022-03-27 DIAGNOSIS — J9621 Acute and chronic respiratory failure with hypoxia: Secondary | ICD-10-CM | POA: Diagnosis not present

## 2022-03-27 DIAGNOSIS — R1314 Dysphagia, pharyngoesophageal phase: Secondary | ICD-10-CM | POA: Diagnosis not present

## 2022-03-27 DIAGNOSIS — Z7951 Long term (current) use of inhaled steroids: Secondary | ICD-10-CM | POA: Diagnosis not present

## 2022-03-27 DIAGNOSIS — Z87891 Personal history of nicotine dependence: Secondary | ICD-10-CM | POA: Diagnosis not present

## 2022-03-27 DIAGNOSIS — N1831 Chronic kidney disease, stage 3a: Secondary | ICD-10-CM | POA: Diagnosis not present

## 2022-03-27 DIAGNOSIS — I5033 Acute on chronic diastolic (congestive) heart failure: Secondary | ICD-10-CM | POA: Diagnosis not present

## 2022-03-27 DIAGNOSIS — H9193 Unspecified hearing loss, bilateral: Secondary | ICD-10-CM | POA: Diagnosis not present

## 2022-03-27 DIAGNOSIS — I2510 Atherosclerotic heart disease of native coronary artery without angina pectoris: Secondary | ICD-10-CM | POA: Diagnosis not present

## 2022-03-27 DIAGNOSIS — J441 Chronic obstructive pulmonary disease with (acute) exacerbation: Secondary | ICD-10-CM | POA: Diagnosis not present

## 2022-03-27 DIAGNOSIS — J440 Chronic obstructive pulmonary disease with acute lower respiratory infection: Secondary | ICD-10-CM | POA: Diagnosis not present

## 2022-03-27 DIAGNOSIS — I130 Hypertensive heart and chronic kidney disease with heart failure and stage 1 through stage 4 chronic kidney disease, or unspecified chronic kidney disease: Secondary | ICD-10-CM | POA: Diagnosis not present

## 2022-03-27 NOTE — Patient Instructions (Addendum)
Visit Information  Thank you for taking time to visit with me today. Please don't hesitate to contact me if I can be of assistance to you.   Following are the goals we discussed today:   Goals Addressed             This Visit's Progress    Receive Assistance Obtaining In-Home Care Services.   On track    Care Coordination Interventions:  Interventions Today    Flowsheet Row Most Recent Value  Chronic Disease Discussed/Reviewed   Chronic disease discussed/reviewed during today's visit Chronic Kidney Disease/End Stage Renal Disease (ESRD)  General Interventions   General Interventions Discussed/Reviewed General Interventions Discussed, General Interventions Reviewed, Durable Medical Equipment (DME), Health Screening, Community Resources, Doctor Visits  Doctor Visits Discussed/Reviewed Doctor Visits Discussed, Doctor Visits Reviewed, Annual Wellness Visits, PCP  Durable Medical Equipment (DME) Bed side commode, BP Cuff, Environmental consultant, Wheelchair, Estate agent  PCP/Specialist Visits Compliance with follow-up visit  Exercise Interventions   Exercise Discussed/Reviewed Exercise Discussed, Exercise Reviewed, Weight Managment, Assistive device use and maintanence  Weight Management Weight loss  Education Interventions   Education Provided Provided Verbal Education  Provided Verbal Education On Nutrition, Mental Health/Coping with Illness, Exercise, Medication, When to see the doctor  Mental Health Interventions   [address] Discussed, Mental Health Reviewed, Coping Strategies  Nutrition Interventions   Nutrition Discussed/Reviewed Nutrition Discussed, Nutrition Reviewed, Portion sizes, Decreasing sugar intake, Increaing proteins, Decreasing fats, Decreasing salt  Pharmacy Interventions   Pharmacy Dicussed/Reviewed Medication Adherence, Affording Medications  Safety Interventions   Safety Discussed/Reviewed Safety Discussed, Safety  Reviewed, Fall Risk, Home Safety  Home Safety Assistive Devices, Need for home safety assessment, Refer for community resources      Active Listening & Reflection Utilized.  Verbalization of Feelings Encouraged.  Emotional Support Provided. Caregiver Stress Acknowledged. Feelings of Caregiver Burnout & Stress Validated. Caregiver Resources Reviewed. Caregiver Support Groups Encouraged, from List Provided. Self-Enrollment in Caregiver Support Group of Interest Emphasized. Crisis Support Information, Agencies & Resources Discussed. Problem Solving Interventions Enacted. Task-Centered Solutions Employed.  Solution-Focused Strategies Activated.  Acceptance & Commitment Therapy Performed. Cognitive Behavioral Therapy Initiated. Client-Centered Therapy Implemented. CSW Collaboration with Loma Sousa, [place] Representative [phone]) with Aging, Disability & Transit Services of [place] [phone]), to Check Status of Recent Referral for [place] the Following Information:   ~ Eligibility Requirements:              Must Be Medicare Recipient.     Must Not Be Eligible for Medicaid or Medicaid Recipient.     Must Not Have Long-Term Care Insurance Policy.     Must Not Require Higher Level of Care (I.e. Assisted Living or Skilled Nursing).  ~ Funding:                     [place] to Aging, [place] [phone]).    [place].   ~ Wait List:    Minimum of 2 Years. CSW Collaboration with Daughter's, [place] to Discuss Conversation with Loma Sousa, [place] Representative [phone]) with Aging, [place] of [place] [phone]) & Encourage Direct Contact, If Wanting to Remove Patient from 2 Year Wait List  to [place]. CSW Environmental education officer from [place], [place] of [place] [phone]), to [place] About Referral for [employer] of Handicapped Accessible Wheelchair Ramp to [employer].   ~ HIPAA Compliant Message Left on Voicemail, as CSW Awaits a Return Call. CSW Collaboration with Daughter's, [place] to Environmental health practitioner with The Following List of [facility], in An Effort to [employer] with Estate agent Ramp to [employer] of Home:   ~ [place] (#  [phone])  ~ Benjamine Sprague (# [phone]) ~ [place] (#      [phone]) ~ Ramping It Up - [employer] [phone]) ~ Aging, [place] of [place] [phone]) [place] with Daughter's, [name] Cozart & [name] to [place] (Physical Therapy & Occupational Therapy), through [facility] [phone]).  CSW Collaboration with Daughter's, [name] Cozart & [name] to Nordstrom of Transportation Benefit through [employer] (# [phone]), Free to [employer].       Our next appointment is by telephone on 04/10/2022 at 9:30 am.  Please call the care guide team at [phone] if you need to cancel or reschedule your appointment.   If you are experiencing a Mental Health or [place] or need someone to talk to, please call the Suicide and Crisis Lifeline: 988 call the [place] National Suicide Prevention Lifeline: (548)064-9614 or TTY: 8451594318 TTY (606) 807-1652) to talk to a trained counselor call 1-800-273-TALK (toll free, 24 hour hotline) go to [facility] Care [address], [place]  [phone]) call the [place]: [phone] call 911  Patient verbalizes understanding of instructions and care plan provided today and agrees to view in [place]. Active MyChart status and patient understanding of how to access instructions and care plan via MyChart confirmed with patient.     Telephone follow up appointment with care management team member scheduled for:  04/10/2022 at 9:30 am.  Nat Christen, BSW, MSW, [place]  Licensed Clinical Social Worker  [place]  Mailing [place]. [address] [place], [place] [address] Physical Address-[address], [place], [place] [address] Toll Free Main # [phone] Fax # [phone] Cell # [phone] [name]

## 2022-03-27 NOTE — Patient Outreach (Signed)
Care Coordination   Follow Up Visit Note   03/27/2022  Name: [name] MRN: [id number] DOB: 04-Feb-1941  Santiago Hinegardner Giffin is a 82 y.o. year old male who sees Luking, Elayne Snare, MD for primary care. I spoke with daughter's, Janith Lima and [name] by phone today.  What matters to the patients health and wellness today?   Receive Assistance Obtaining In-Home Care Services.   Goals Addressed             This Visit's Progress    Receive Assistance Obtaining In-Home Care Services.   On track    Care Coordination Interventions:  Interventions Today    Flowsheet Row Most Recent Value  Chronic Disease Discussed/Reviewed   Chronic disease discussed/reviewed during today's visit Chronic Kidney Disease/End Stage Renal Disease (ESRD)  General Interventions   General Interventions Discussed/Reviewed General Interventions Discussed, General Interventions Reviewed, Durable Medical Equipment (DME), Health Screening, Community Resources, Doctor Visits  Doctor Visits Discussed/Reviewed Doctor Visits Discussed, Doctor Visits Reviewed, Annual Wellness Visits, PCP  Durable Medical Equipment (DME) Bed side commode, BP Cuff, Environmental consultant, Wheelchair, Estate agent  PCP/Specialist Visits Compliance with follow-up visit  Exercise Interventions   Exercise Discussed/Reviewed Exercise Discussed, Exercise Reviewed, Weight Managment, Assistive device use and maintanence  Weight Management Weight loss  Education Interventions   Education Provided Provided Verbal Education  Provided Verbal Education On Nutrition, Mental Health/Coping with Illness, Exercise, Medication, When to see the doctor  Mental Health Interventions   [place] Discussed, Mental Health Reviewed, Coping Strategies  Nutrition Interventions   Nutrition Discussed/Reviewed Nutrition Discussed, Nutrition Reviewed, Portion sizes, Decreasing sugar intake, Increaing proteins,  Decreasing fats, Decreasing salt  Pharmacy Interventions   Pharmacy Dicussed/Reviewed Medication Adherence, Affording Medications  Safety Interventions   Safety Discussed/Reviewed Safety Discussed, Safety Reviewed, Fall Risk, Home Safety  Home Safety Assistive Devices, Need for home safety assessment, Refer for community resources      Active Listening & Reflection Utilized.  Verbalization of Feelings Encouraged.  Emotional Support Provided. Caregiver Stress Acknowledged. Feelings of Caregiver Burnout & Stress Validated. Caregiver Resources Reviewed. Caregiver Support Groups Encouraged, from List Provided. Self-Enrollment in Caregiver Support Group of Interest Emphasized. Crisis Support Information, Agencies & Resources Discussed. Problem Solving Interventions Enacted. Task-Centered Solutions Employed.  Solution-Focused Strategies Activated.  Acceptance & Commitment Therapy Performed. Cognitive Behavioral Therapy Initiated. Client-Centered Therapy Implemented. CSW Collaboration with Loma Sousa, [place] Representative [phone]) with Aging, Disability & Transit Services of [place] [phone]), to Check Status of Recent Referral for [place] the Following Information:                        ~ Eligibility Requirements:                           Must Be Medicare Recipient.  Must Not Be Eligible for Medicaid or Medicaid Recipient.  Must Not Have Long-Term Care Insurance Policy.  Must Not Require Higher Level of Care (I.e. Assisted Living or Skilled Nursing).       ~ Funding:   [place] to Aging, [place] [phone]). [place].             ~ Wait List:   Minimum of  2 Years. CSW Collaboration with Daughter's, [place] to Discuss Conversation with Loma Sousa, [place] Representative [phone]) with Aging, [place] of [place] [phone]) & Encourage Direct Contact, If Wanting to Remove Patient from 2 Year Wait List to [place]. CSW Environmental education officer from [place], [place] of [place] [phone]), to [place] About Referral for [employer] of Handicapped Accessible Wheelchair Ramp to [employer].   ~ HIPAA Compliant Message Left on Voicemail, as CSW Awaits a Return Call. CSW Collaboration with Daughter's, [place] to Environmental health practitioner with The Following List of [facility], in An Effort to [employer] with Estate agent Ramp to [employer] Insurance of Home:   ~ [place] (#  [phone])  ~ Benjamine Sprague (# [phone]) ~ [place] (#     [phone]) ~ Ramping It Up - [employer] [phone]) ~ Aging, [place] of [place] [phone]) [place] with Daughter's, [name] Cozart & [name] to [place] (Physical Therapy & Occupational Therapy), through [employer] [place] [phone]).  CSW Collaboration with Daughter's, [name] Cozart & [name] to [employer]strom of Transportation Benefit through [employer] (# [phone]), Free to [employer].       SDOH assessments and interventions completed:  Yes.  Care Coordination Interventions:  Yes, provided.   Follow up plan: Follow up call scheduled for 04/10/2022 at 9:30 am.  Encounter Outcome:  Pt. Visit Completed.   Nat Christen, BSW, MSW, LCSW  Licensed Education officer, environmental Health System  Mailing [place].  [address] [place], [place] [address] Physical Address-[address], [place],  [address] Toll Free Main # [phone] Fax # [phone] Cell # [phone] [name]

## 2022-03-28 ENCOUNTER — Encounter: Payer: Medicare Other | Admitting: *Deleted

## 2022-03-29 ENCOUNTER — Telehealth: Payer: Self-pay | Admitting: Family Medicine

## 2022-03-29 NOTE — Telephone Encounter (Signed)
Nurses I have received a request regarding additional information so that he can get proper walker And bedside commode We need additional information from caretaker/family As for the walker-please verify with family that he has a need for a walker.  I assume with wheels?  With a seat?  Also it would be heavy duty due to his size Based upon what the family observes would the walker allow him to move about better within the home?  In regards to bedside commode Medicare will cover a bedside commode #1 if confined to a single room #2 patient confined to 1 level of the home and there is no toilet on that level or #3 patient is confined to the home and no toilet facilities in the home  My question is have the family detail out why they need a bedside commode so I can try to justify. Certainly if the bathroom is not accessible for his size that would be another reason we should be able to get bedside commode  Once I receive this additional information Can do the proper documentation and send it to them/the provider of these equipment thank you

## 2022-03-29 NOTE — Telephone Encounter (Signed)
Chrys Racer from [facility] calling to let us know pt BP was 151/63 without med. Chrys Racer was there around lunch and pt had not taken med). Pt took meds and BP, Heart rate and O2 were within normal limits.   Chrys Racer states to call if any restrictions needed for pt. [phone]

## 2022-03-29 NOTE — Telephone Encounter (Signed)
Keep all as is, follow-up as planned

## 2022-03-30 DIAGNOSIS — I5033 Acute on chronic diastolic (congestive) heart failure: Secondary | ICD-10-CM | POA: Diagnosis not present

## 2022-03-30 DIAGNOSIS — I10 Essential (primary) hypertension: Secondary | ICD-10-CM | POA: Diagnosis not present

## 2022-03-30 NOTE — Telephone Encounter (Signed)
Pt has walker already. Per Autumn, she has faxed the script to [facility] for bariatric bedside commode.

## 2022-04-02 DIAGNOSIS — I130 Hypertensive heart and chronic kidney disease with heart failure and stage 1 through stage 4 chronic kidney disease, or unspecified chronic kidney disease: Secondary | ICD-10-CM | POA: Diagnosis not present

## 2022-04-02 DIAGNOSIS — Z87891 Personal history of nicotine dependence: Secondary | ICD-10-CM | POA: Diagnosis not present

## 2022-04-02 DIAGNOSIS — Z9181 History of falling: Secondary | ICD-10-CM | POA: Diagnosis not present

## 2022-04-02 DIAGNOSIS — R1314 Dysphagia, pharyngoesophageal phase: Secondary | ICD-10-CM | POA: Diagnosis not present

## 2022-04-02 DIAGNOSIS — H9193 Unspecified hearing loss, bilateral: Secondary | ICD-10-CM | POA: Diagnosis not present

## 2022-04-02 DIAGNOSIS — J440 Chronic obstructive pulmonary disease with acute lower respiratory infection: Secondary | ICD-10-CM | POA: Diagnosis not present

## 2022-04-02 DIAGNOSIS — Z9981 Dependence on supplemental oxygen: Secondary | ICD-10-CM | POA: Diagnosis not present

## 2022-04-02 DIAGNOSIS — R338 Other retention of urine: Secondary | ICD-10-CM | POA: Diagnosis not present

## 2022-04-02 DIAGNOSIS — F32A Depression, unspecified: Secondary | ICD-10-CM | POA: Diagnosis not present

## 2022-04-02 DIAGNOSIS — E785 Hyperlipidemia, unspecified: Secondary | ICD-10-CM | POA: Diagnosis not present

## 2022-04-02 DIAGNOSIS — J441 Chronic obstructive pulmonary disease with (acute) exacerbation: Secondary | ICD-10-CM | POA: Diagnosis not present

## 2022-04-02 DIAGNOSIS — G4733 Obstructive sleep apnea (adult) (pediatric): Secondary | ICD-10-CM | POA: Diagnosis not present

## 2022-04-02 DIAGNOSIS — N1831 Chronic kidney disease, stage 3a: Secondary | ICD-10-CM | POA: Diagnosis not present

## 2022-04-02 DIAGNOSIS — I5033 Acute on chronic diastolic (congestive) heart failure: Secondary | ICD-10-CM | POA: Diagnosis not present

## 2022-04-02 DIAGNOSIS — J9621 Acute and chronic respiratory failure with hypoxia: Secondary | ICD-10-CM | POA: Diagnosis not present

## 2022-04-02 DIAGNOSIS — Z7951 Long term (current) use of inhaled steroids: Secondary | ICD-10-CM | POA: Diagnosis not present

## 2022-04-02 DIAGNOSIS — I2510 Atherosclerotic heart disease of native coronary artery without angina pectoris: Secondary | ICD-10-CM | POA: Diagnosis not present

## 2022-04-03 ENCOUNTER — Ambulatory Visit: Payer: Medicare Other | Admitting: "Endocrinology

## 2022-04-04 ENCOUNTER — Ambulatory Visit: Payer: Self-pay | Admitting: *Deleted

## 2022-04-04 NOTE — Patient Outreach (Signed)
  Care Coordination   Follow Up Visit Note   08/15/2022 late entry for 04/04/22 Name: [name] MRN: [id number] DOB: [birth year]  [name] is a 82 y.o. year old male who sees Luking, [name], MD for primary care. I spoke with  Janace Litten by phone today.  What matters to the patients health and wellness today?  Weight down to 301 was 317  Loves Cheetos per daughter. He eats a half a bag of the family size.  This is believed to be an issue   Goals Addressed               This Visit's Progress     Patient Stated     Effective management of CHF (pt-stated)   Not on track     Care Coordination Interventions: Interventions Today    Flowsheet Row Most Recent Value  Chronic Disease   Chronic disease during today's visit Congestive Heart Failure (CHF), Hypertension (HTN), Other  General Interventions   General Interventions Discussed/Reviewed General Interventions Reviewed, Doctor Visits  Doctor Visits Discussed/Reviewed Doctor Visits Reviewed, PCP, Specialist  PCP/Specialist Visits Compliance with follow-up visit  Exercise Interventions   Exercise Discussed/Reviewed Weight Managment, Exercise Discussed  Physical Activity Discussed/Reviewed Physical Activity Discussed, Home Exercise Program (HEP)  Weight Management Weight loss  Education Interventions   Education Provided Provided Education  Provided Verbal Education On Nutrition, Exercise  Mental Health Interventions   Mental Health Discussed/Reviewed Mental Health Discussed, Coping Strategies  Nutrition Interventions   Nutrition Discussed/Reviewed Nutrition Discussed, Adding fruits and vegetables, Fluid intake, Portion sizes, Increasing proteins             SDOH assessments and interventions completed:  No     Care Coordination Interventions:  Yes, provided   Follow up plan: Follow up call scheduled for 09/03/22    Encounter Outcome:  Pt. Visit Completed   Norris Brumbach L. Noelle Penner, RN, BSN, CCM [facility]  Care Management Community Coordinator Office number [phone]

## 2022-04-05 DIAGNOSIS — Z9981 Dependence on supplemental oxygen: Secondary | ICD-10-CM | POA: Diagnosis not present

## 2022-04-05 DIAGNOSIS — R338 Other retention of urine: Secondary | ICD-10-CM | POA: Diagnosis not present

## 2022-04-05 DIAGNOSIS — H9193 Unspecified hearing loss, bilateral: Secondary | ICD-10-CM | POA: Diagnosis not present

## 2022-04-05 DIAGNOSIS — Z87891 Personal history of nicotine dependence: Secondary | ICD-10-CM | POA: Diagnosis not present

## 2022-04-05 DIAGNOSIS — J440 Chronic obstructive pulmonary disease with acute lower respiratory infection: Secondary | ICD-10-CM | POA: Diagnosis not present

## 2022-04-05 DIAGNOSIS — R1314 Dysphagia, pharyngoesophageal phase: Secondary | ICD-10-CM | POA: Diagnosis not present

## 2022-04-05 DIAGNOSIS — J441 Chronic obstructive pulmonary disease with (acute) exacerbation: Secondary | ICD-10-CM | POA: Diagnosis not present

## 2022-04-05 DIAGNOSIS — E785 Hyperlipidemia, unspecified: Secondary | ICD-10-CM | POA: Diagnosis not present

## 2022-04-05 DIAGNOSIS — Z9181 History of falling: Secondary | ICD-10-CM | POA: Diagnosis not present

## 2022-04-05 DIAGNOSIS — F32A Depression, unspecified: Secondary | ICD-10-CM | POA: Diagnosis not present

## 2022-04-05 DIAGNOSIS — I2510 Atherosclerotic heart disease of native coronary artery without angina pectoris: Secondary | ICD-10-CM | POA: Diagnosis not present

## 2022-04-05 DIAGNOSIS — J9621 Acute and chronic respiratory failure with hypoxia: Secondary | ICD-10-CM | POA: Diagnosis not present

## 2022-04-05 DIAGNOSIS — I130 Hypertensive heart and chronic kidney disease with heart failure and stage 1 through stage 4 chronic kidney disease, or unspecified chronic kidney disease: Secondary | ICD-10-CM | POA: Diagnosis not present

## 2022-04-05 DIAGNOSIS — I5033 Acute on chronic diastolic (congestive) heart failure: Secondary | ICD-10-CM | POA: Diagnosis not present

## 2022-04-05 DIAGNOSIS — Z7951 Long term (current) use of inhaled steroids: Secondary | ICD-10-CM | POA: Diagnosis not present

## 2022-04-05 DIAGNOSIS — N1831 Chronic kidney disease, stage 3a: Secondary | ICD-10-CM | POA: Diagnosis not present

## 2022-04-05 DIAGNOSIS — G4733 Obstructive sleep apnea (adult) (pediatric): Secondary | ICD-10-CM | POA: Diagnosis not present

## 2022-04-06 ENCOUNTER — Telehealth: Payer: Self-pay

## 2022-04-06 NOTE — Telephone Encounter (Signed)
Order for 3 in 1 Bariatric Potty chair was faxed to [facility] times 2 last week.  Jim at home health stated he would check on the status of order and notify us if anything was needed

## 2022-04-06 NOTE — Telephone Encounter (Signed)
[name] was calling and following up DME Company was waiting on Doctor to approve to get potty chair and never hear back [name] is following up   [employer] back number is [phone]

## 2022-04-10 ENCOUNTER — Encounter: Payer: Self-pay | Admitting: *Deleted

## 2022-04-10 ENCOUNTER — Ambulatory Visit: Payer: Self-pay | Admitting: *Deleted

## 2022-04-10 DIAGNOSIS — Z9181 History of falling: Secondary | ICD-10-CM | POA: Diagnosis not present

## 2022-04-10 DIAGNOSIS — J9621 Acute and chronic respiratory failure with hypoxia: Secondary | ICD-10-CM | POA: Diagnosis not present

## 2022-04-10 DIAGNOSIS — I5033 Acute on chronic diastolic (congestive) heart failure: Secondary | ICD-10-CM | POA: Diagnosis not present

## 2022-04-10 DIAGNOSIS — F32A Depression, unspecified: Secondary | ICD-10-CM | POA: Diagnosis not present

## 2022-04-10 DIAGNOSIS — G4733 Obstructive sleep apnea (adult) (pediatric): Secondary | ICD-10-CM | POA: Diagnosis not present

## 2022-04-10 DIAGNOSIS — I2510 Atherosclerotic heart disease of native coronary artery without angina pectoris: Secondary | ICD-10-CM | POA: Diagnosis not present

## 2022-04-10 DIAGNOSIS — Z87891 Personal history of nicotine dependence: Secondary | ICD-10-CM | POA: Diagnosis not present

## 2022-04-10 DIAGNOSIS — E785 Hyperlipidemia, unspecified: Secondary | ICD-10-CM | POA: Diagnosis not present

## 2022-04-10 DIAGNOSIS — Z9981 Dependence on supplemental oxygen: Secondary | ICD-10-CM | POA: Diagnosis not present

## 2022-04-10 DIAGNOSIS — J440 Chronic obstructive pulmonary disease with acute lower respiratory infection: Secondary | ICD-10-CM | POA: Diagnosis not present

## 2022-04-10 DIAGNOSIS — J441 Chronic obstructive pulmonary disease with (acute) exacerbation: Secondary | ICD-10-CM | POA: Diagnosis not present

## 2022-04-10 DIAGNOSIS — Z7951 Long term (current) use of inhaled steroids: Secondary | ICD-10-CM | POA: Diagnosis not present

## 2022-04-10 DIAGNOSIS — R1314 Dysphagia, pharyngoesophageal phase: Secondary | ICD-10-CM | POA: Diagnosis not present

## 2022-04-10 DIAGNOSIS — N1831 Chronic kidney disease, stage 3a: Secondary | ICD-10-CM | POA: Diagnosis not present

## 2022-04-10 DIAGNOSIS — H9193 Unspecified hearing loss, bilateral: Secondary | ICD-10-CM | POA: Diagnosis not present

## 2022-04-10 DIAGNOSIS — R338 Other retention of urine: Secondary | ICD-10-CM | POA: Diagnosis not present

## 2022-04-10 DIAGNOSIS — I130 Hypertensive heart and chronic kidney disease with heart failure and stage 1 through stage 4 chronic kidney disease, or unspecified chronic kidney disease: Secondary | ICD-10-CM | POA: Diagnosis not present

## 2022-04-10 NOTE — Patient Instructions (Signed)
Visit Information  Thank you for taking time to visit with me today. Please don't hesitate to contact me if I can be of assistance to you.   Following are the goals we discussed today:   Goals Addressed             This Visit's Progress    Receive Assistance Obtaining In-Home Care Services.   On track    Care Coordination Interventions:  Interventions Today    Flowsheet Row Most Recent Value  Chronic Disease Discussed/Reviewed   Chronic disease discussed/reviewed during today's visit Chronic Kidney Disease/End Stage Renal Disease (ESRD)  General Interventions   General Interventions Discussed/Reviewed General Interventions Discussed, General Interventions Reviewed, Durable Medical Equipment (DME), Health Screening, Community Resources, Doctor Visits  Doctor Visits Discussed/Reviewed Doctor Visits Discussed, Doctor Visits Reviewed, Annual Wellness Visits, PCP  Durable Medical Equipment (DME) Bed side commode, BP Cuff, Environmental consultant, Wheelchair, Estate agent  PCP/Specialist Visits Compliance with follow-up visit  Exercise Interventions   Exercise Discussed/Reviewed Exercise Discussed, Exercise Reviewed, Weight Managment, Assistive device use and maintanence  Weight Management Weight loss  Education Interventions   Education Provided Provided Verbal Education  Provided Verbal Education On Nutrition, Mental Health/Coping with Illness, Exercise, Medication, When to see the doctor  Mental Health Interventions   [place] Discussed, Mental Health Reviewed, Coping Strategies  Nutrition Interventions   Nutrition Discussed/Reviewed Nutrition Discussed, Nutrition Reviewed, Portion sizes, Decreasing sugar intake, Increaing proteins, Decreasing fats, Decreasing salt  Pharmacy Interventions   Pharmacy Dicussed/Reviewed Medication Adherence, Affording Medications  Safety Interventions   Safety Discussed/Reviewed Safety Discussed, Safety  Reviewed, Fall Risk, Home Safety  Home Safety Assistive Devices, Need for home safety assessment, Refer for community resources      Active Listening & Reflection Utilized.  Verbalization of Feelings Encouraged.  Emotional Support Provided. Caregiver Stress Acknowledged. Feelings of Caregiver Burnout Validated. Caregiver Resources Reviewed. Caregiver Support Groups Revisited. Self-Enrollment in Caregiver Support Group of Interest Emphasized. Problem Solving Interventions Implemented. Task-Centered Solutions Employed.  Solution-Focused Strategies Activated.  Acceptance & Commitment Therapy Performed. Cognitive Behavioral Therapy Initiated. Client-Centered Therapy Enacted. CSW Collaboration with Daughter, [name] to [employer] of 2 Year Waiting List to [place], through [place] of [place] [phone]). CSW Environmental education officer from [place], through [place] of [place] [phone]), to Confirm Name on Waiting List for [place], As Well As Installation of A Handicapped Accessible Wheelchair Ramp. CSW Collaboration with Daughter, [name] to Environmental health practitioner with The Following List of [facility], in An Effort to [employer] with Proofreader of [place]:    ~ [employer] (#  [phone])   ~ Benjamine Sprague (# [phone])     ~ [place] RAMMP - [place]         (# [phone])     ~ Ramping It Up - [employer] [phone])     ~ Aging, Disability & Transit Services of [place]         [phone]) [place] with Daughter, [name] to [place], Provided by [facility]        [phone]).   CSW Collaboration with Howie Ill with [place] [phone]# [phone]), to Inquire About Status of 3-In-1 Bariatric Potty Chair, Ordered  by Primary Care Provider, Dr. Sallee Lange with [place] [phone]).   ~ HIPAA Compliant Message Left on Voicemail, As CSW Awaits A Return                        Call.      Our next appointment is by telephone on 04/25/2022 at 9:45 am.  Please call the care guide team at [phone] if you need to cancel or reschedule your appointment.   If you are experiencing a Mental Health or [place] or need someone to talk to, please call the Suicide and Crisis Lifeline: 988 call the [place] National Suicide Prevention Lifeline: 515-371-4080 or TTY: 815-594-5429 TTY 6051035907) to talk to a trained counselor call 1-800-273-TALK (toll free, 24 hour hotline) go to [facility] [address], [place] [phone]) call the [place]: [phone] call 911  Patient verbalizes understanding of instructions and care plan provided today and agrees to view in [place]. Active MyChart status and patient understanding of how to access instructions and care plan via MyChart confirmed with patient.     Telephone follow up appointment with care management team member scheduled for:  04/25/2022 at 9:45 am.  Nat Christen, BSW, MSW, [place]  Licensed Clinical Social Worker  [place]  Mailing [place]. [address] [place], [place] [address] Physical Address-[address], [place], [place] [address] Toll Free Main # [phone] Fax # [phone] Cell # [phone] [name]

## 2022-04-10 NOTE — Patient Outreach (Signed)
Care Coordination   Follow Up Visit Note   04/10/2022  Name: [name] MRN: [id number] DOB: 04/28/1940  [name] is a 82 y.o. year old male who sees Luking, Elayne Snare, MD for primary care. I spoke with daughter, [name] by phone today.  What matters to the patients health and wellness today?   Receive Assistance Obtaining In-Home Care Services.   Goals Addressed             This Visit's Progress    Receive Assistance Obtaining In-Home Care Services.   On track    Care Coordination Interventions:  Interventions Today    Flowsheet Row Most Recent Value  Chronic Disease Discussed/Reviewed   Chronic disease discussed/reviewed during today's visit Chronic Kidney Disease/End Stage Renal Disease (ESRD)  General Interventions   General Interventions Discussed/Reviewed General Interventions Discussed, General Interventions Reviewed, Durable Medical Equipment (DME), Health Screening, Community Resources, Doctor Visits  Doctor Visits Discussed/Reviewed Doctor Visits Discussed, Doctor Visits Reviewed, Annual Wellness Visits, PCP  Durable Medical Equipment (DME) Bed side commode, BP Cuff, Environmental consultant, Wheelchair, Estate agent  PCP/Specialist Visits Compliance with follow-up visit  Exercise Interventions   Exercise Discussed/Reviewed Exercise Discussed, Exercise Reviewed, Weight Managment, Assistive device use and maintanence  Weight Management Weight loss  Education Interventions   Education Provided Provided Verbal Education  Provided Verbal Education On Nutrition, Mental Health/Coping with Illness, Exercise, Medication, When to see the doctor  Mental Health Interventions   [place] Discussed, Mental Health Reviewed, Coping Strategies  Nutrition Interventions   Nutrition Discussed/Reviewed Nutrition Discussed, Nutrition Reviewed, Portion sizes, Decreasing sugar intake, Increaing proteins, Decreasing fats,  Decreasing salt  Pharmacy Interventions   Pharmacy Dicussed/Reviewed Medication Adherence, Affording Medications  Safety Interventions   Safety Discussed/Reviewed Safety Discussed, Safety Reviewed, Fall Risk, Home Safety  Home Safety Assistive Devices, Need for home safety assessment, Refer for community resources      Active Listening & Reflection Utilized.  Verbalization of Feelings Encouraged.  Emotional Support Provided. Caregiver Stress Acknowledged. Feelings of Caregiver Burnout Validated. Caregiver Resources Reviewed. Caregiver Support Groups Revisited. Self-Enrollment in Caregiver Support Group of Interest Emphasized. Problem Solving Interventions Implemented. Task-Centered Solutions Employed.  Solution-Focused Strategies Activated.  Acceptance & Commitment Therapy Performed. Cognitive Behavioral Therapy Initiated. Client-Centered Therapy Enacted. CSW Collaboration with Daughter, [name] to [employer] of 2 Year Waiting List to [place], through [place] of [place] [phone]). CSW Environmental education officer from [place], through [place] of [place] [phone]), to Confirm Name on Waiting List for [place], As Well As Installation of A Handicapped Accessible Wheelchair Ramp. CSW Collaboration with Daughter, [name] to Environmental health practitioner with The Following List of [facility], in An Effort to [employer] with Proofreader of [place]:    ~ [employer] (#  [phone])   ~ Benjamine Sprague (# [phone])     ~ [place] RAMMP - [place]         (# [phone])     ~ Ramping It Up - [employer] [phone])     ~ Aging, Disability & Transit Services of [place]          [phone]) [place] with Daughter, [name] to [place], Provided by [facility] [phone]).  CSW Collaboration with Howie Ill with [place] [phone]# [phone]), to Inquire About Status of 3-In-1 Bariatric Potty Chair, Ordered by Primary Care Provider, Dr. Sallee Lange with [place] [phone]# [phone]).   ~ HIPAA Compliant Message Left on Voicemail, As CSW Awaits A Return                         Call.      SDOH assessments and interventions completed:  Yes.  Care Coordination Interventions:  Yes, provided.   Follow up plan: Follow up call scheduled for 04/25/2022 at 9:45 am.  Encounter Outcome:  Pt. Visit Completed.   [name], BSW, MSW, LCSW  Licensed Education officer, environmental Health System  Mailing [place] [address], [place], [place] [address] Physical Address-[address], [place], [place] [address] Toll Free Main # [phone] Fax # [phone] Cell # [phone] [name]

## 2022-04-11 ENCOUNTER — Other Ambulatory Visit: Payer: Self-pay | Admitting: *Deleted

## 2022-04-11 MED ORDER — ISOSORBIDE MONONITRATE ER 30 MG PO TB24: 90.0000 mg | ORAL_TABLET | Freq: Every day | ORAL | 2 refills | Status: DC

## 2022-04-12 DIAGNOSIS — Z87891 Personal history of nicotine dependence: Secondary | ICD-10-CM | POA: Diagnosis not present

## 2022-04-12 DIAGNOSIS — I5033 Acute on chronic diastolic (congestive) heart failure: Secondary | ICD-10-CM | POA: Diagnosis not present

## 2022-04-12 DIAGNOSIS — N1831 Chronic kidney disease, stage 3a: Secondary | ICD-10-CM | POA: Diagnosis not present

## 2022-04-12 DIAGNOSIS — I130 Hypertensive heart and chronic kidney disease with heart failure and stage 1 through stage 4 chronic kidney disease, or unspecified chronic kidney disease: Secondary | ICD-10-CM | POA: Diagnosis not present

## 2022-04-12 DIAGNOSIS — J440 Chronic obstructive pulmonary disease with acute lower respiratory infection: Secondary | ICD-10-CM | POA: Diagnosis not present

## 2022-04-12 DIAGNOSIS — H9193 Unspecified hearing loss, bilateral: Secondary | ICD-10-CM | POA: Diagnosis not present

## 2022-04-12 DIAGNOSIS — J9621 Acute and chronic respiratory failure with hypoxia: Secondary | ICD-10-CM | POA: Diagnosis not present

## 2022-04-12 DIAGNOSIS — E785 Hyperlipidemia, unspecified: Secondary | ICD-10-CM | POA: Diagnosis not present

## 2022-04-12 DIAGNOSIS — R1314 Dysphagia, pharyngoesophageal phase: Secondary | ICD-10-CM | POA: Diagnosis not present

## 2022-04-12 DIAGNOSIS — Z7951 Long term (current) use of inhaled steroids: Secondary | ICD-10-CM | POA: Diagnosis not present

## 2022-04-12 DIAGNOSIS — Z9181 History of falling: Secondary | ICD-10-CM | POA: Diagnosis not present

## 2022-04-12 DIAGNOSIS — J441 Chronic obstructive pulmonary disease with (acute) exacerbation: Secondary | ICD-10-CM | POA: Diagnosis not present

## 2022-04-12 DIAGNOSIS — G4733 Obstructive sleep apnea (adult) (pediatric): Secondary | ICD-10-CM | POA: Diagnosis not present

## 2022-04-12 DIAGNOSIS — R338 Other retention of urine: Secondary | ICD-10-CM | POA: Diagnosis not present

## 2022-04-12 DIAGNOSIS — F32A Depression, unspecified: Secondary | ICD-10-CM | POA: Diagnosis not present

## 2022-04-12 DIAGNOSIS — Z9981 Dependence on supplemental oxygen: Secondary | ICD-10-CM | POA: Diagnosis not present

## 2022-04-12 DIAGNOSIS — I2510 Atherosclerotic heart disease of native coronary artery without angina pectoris: Secondary | ICD-10-CM | POA: Diagnosis not present

## 2022-04-13 ENCOUNTER — Ambulatory Visit: Payer: Medicare Other | Admitting: Family Medicine

## 2022-04-16 DIAGNOSIS — Z9181 History of falling: Secondary | ICD-10-CM | POA: Diagnosis not present

## 2022-04-16 DIAGNOSIS — Z9981 Dependence on supplemental oxygen: Secondary | ICD-10-CM | POA: Diagnosis not present

## 2022-04-16 DIAGNOSIS — J9621 Acute and chronic respiratory failure with hypoxia: Secondary | ICD-10-CM | POA: Diagnosis not present

## 2022-04-16 DIAGNOSIS — R338 Other retention of urine: Secondary | ICD-10-CM | POA: Diagnosis not present

## 2022-04-16 DIAGNOSIS — I5033 Acute on chronic diastolic (congestive) heart failure: Secondary | ICD-10-CM | POA: Diagnosis not present

## 2022-04-16 DIAGNOSIS — N1831 Chronic kidney disease, stage 3a: Secondary | ICD-10-CM | POA: Diagnosis not present

## 2022-04-16 DIAGNOSIS — H9193 Unspecified hearing loss, bilateral: Secondary | ICD-10-CM | POA: Diagnosis not present

## 2022-04-16 DIAGNOSIS — R1314 Dysphagia, pharyngoesophageal phase: Secondary | ICD-10-CM | POA: Diagnosis not present

## 2022-04-16 DIAGNOSIS — E785 Hyperlipidemia, unspecified: Secondary | ICD-10-CM | POA: Diagnosis not present

## 2022-04-16 DIAGNOSIS — G4733 Obstructive sleep apnea (adult) (pediatric): Secondary | ICD-10-CM | POA: Diagnosis not present

## 2022-04-16 DIAGNOSIS — Z7951 Long term (current) use of inhaled steroids: Secondary | ICD-10-CM | POA: Diagnosis not present

## 2022-04-16 DIAGNOSIS — Z87891 Personal history of nicotine dependence: Secondary | ICD-10-CM | POA: Diagnosis not present

## 2022-04-16 DIAGNOSIS — F32A Depression, unspecified: Secondary | ICD-10-CM | POA: Diagnosis not present

## 2022-04-16 DIAGNOSIS — I130 Hypertensive heart and chronic kidney disease with heart failure and stage 1 through stage 4 chronic kidney disease, or unspecified chronic kidney disease: Secondary | ICD-10-CM | POA: Diagnosis not present

## 2022-04-16 DIAGNOSIS — J441 Chronic obstructive pulmonary disease with (acute) exacerbation: Secondary | ICD-10-CM | POA: Diagnosis not present

## 2022-04-16 DIAGNOSIS — J440 Chronic obstructive pulmonary disease with acute lower respiratory infection: Secondary | ICD-10-CM | POA: Diagnosis not present

## 2022-04-16 DIAGNOSIS — I2510 Atherosclerotic heart disease of native coronary artery without angina pectoris: Secondary | ICD-10-CM | POA: Diagnosis not present

## 2022-04-17 DIAGNOSIS — M6281 Muscle weakness (generalized): Secondary | ICD-10-CM | POA: Diagnosis not present

## 2022-04-17 DIAGNOSIS — I2510 Atherosclerotic heart disease of native coronary artery without angina pectoris: Secondary | ICD-10-CM | POA: Diagnosis not present

## 2022-04-17 DIAGNOSIS — I509 Heart failure, unspecified: Secondary | ICD-10-CM | POA: Diagnosis not present

## 2022-04-20 ENCOUNTER — Encounter: Payer: Self-pay | Admitting: "Endocrinology

## 2022-04-20 DIAGNOSIS — J9601 Acute respiratory failure with hypoxia: Secondary | ICD-10-CM | POA: Diagnosis not present

## 2022-04-20 DIAGNOSIS — J441 Chronic obstructive pulmonary disease with (acute) exacerbation: Secondary | ICD-10-CM | POA: Diagnosis not present

## 2022-04-20 DIAGNOSIS — R0902 Hypoxemia: Secondary | ICD-10-CM | POA: Diagnosis not present

## 2022-04-24 ENCOUNTER — Telehealth: Payer: Self-pay

## 2022-04-24 DIAGNOSIS — Z9181 History of falling: Secondary | ICD-10-CM | POA: Diagnosis not present

## 2022-04-24 DIAGNOSIS — Z7951 Long term (current) use of inhaled steroids: Secondary | ICD-10-CM | POA: Diagnosis not present

## 2022-04-24 DIAGNOSIS — J441 Chronic obstructive pulmonary disease with (acute) exacerbation: Secondary | ICD-10-CM | POA: Diagnosis not present

## 2022-04-24 DIAGNOSIS — N1831 Chronic kidney disease, stage 3a: Secondary | ICD-10-CM | POA: Diagnosis not present

## 2022-04-24 DIAGNOSIS — I2510 Atherosclerotic heart disease of native coronary artery without angina pectoris: Secondary | ICD-10-CM | POA: Diagnosis not present

## 2022-04-24 DIAGNOSIS — I5033 Acute on chronic diastolic (congestive) heart failure: Secondary | ICD-10-CM | POA: Diagnosis not present

## 2022-04-24 DIAGNOSIS — H9193 Unspecified hearing loss, bilateral: Secondary | ICD-10-CM | POA: Diagnosis not present

## 2022-04-24 DIAGNOSIS — J440 Chronic obstructive pulmonary disease with acute lower respiratory infection: Secondary | ICD-10-CM | POA: Diagnosis not present

## 2022-04-24 DIAGNOSIS — J9621 Acute and chronic respiratory failure with hypoxia: Secondary | ICD-10-CM | POA: Diagnosis not present

## 2022-04-24 DIAGNOSIS — F32A Depression, unspecified: Secondary | ICD-10-CM | POA: Diagnosis not present

## 2022-04-24 DIAGNOSIS — I130 Hypertensive heart and chronic kidney disease with heart failure and stage 1 through stage 4 chronic kidney disease, or unspecified chronic kidney disease: Secondary | ICD-10-CM | POA: Diagnosis not present

## 2022-04-24 DIAGNOSIS — G4733 Obstructive sleep apnea (adult) (pediatric): Secondary | ICD-10-CM | POA: Diagnosis not present

## 2022-04-24 DIAGNOSIS — Z9981 Dependence on supplemental oxygen: Secondary | ICD-10-CM | POA: Diagnosis not present

## 2022-04-24 DIAGNOSIS — R1314 Dysphagia, pharyngoesophageal phase: Secondary | ICD-10-CM | POA: Diagnosis not present

## 2022-04-24 DIAGNOSIS — E785 Hyperlipidemia, unspecified: Secondary | ICD-10-CM | POA: Diagnosis not present

## 2022-04-24 DIAGNOSIS — R338 Other retention of urine: Secondary | ICD-10-CM | POA: Diagnosis not present

## 2022-04-24 DIAGNOSIS — Z87891 Personal history of nicotine dependence: Secondary | ICD-10-CM | POA: Diagnosis not present

## 2022-04-24 NOTE — Telephone Encounter (Signed)
Chrys Racer PT called for a verbal order for physical therapy 2 week 1, 1 week 7 for patient. Clear for activity as tolerated.  FYI Patient still in bed when PT got there. Patient was late getting started morning BP 157/91. Patient showed signs of no distress. Patient c/o  his head hurting from a previous fall. Chrys Racer stated this has been an on going issue for him. Also patient is having numbness in right ring finger and pinky that radiates to wrist.

## 2022-04-24 NOTE — Telephone Encounter (Signed)
Called and left message for [place] PT [phone]

## 2022-04-24 NOTE — Telephone Encounter (Signed)
Chrys Racer Physical Therapy [phone]

## 2022-04-24 NOTE — Telephone Encounter (Signed)
May have verbal order as requested As for blood pressure issues, falls, numbness in the hand we are more than willing to evaluate hopefully he will schedule some sort of follow-up visit for the spring sooner if worsening issues

## 2022-04-25 ENCOUNTER — Telehealth: Payer: Self-pay

## 2022-04-25 ENCOUNTER — Encounter: Payer: Self-pay | Admitting: *Deleted

## 2022-04-25 ENCOUNTER — Ambulatory Visit: Payer: Self-pay | Admitting: *Deleted

## 2022-04-25 DIAGNOSIS — Z9981 Dependence on supplemental oxygen: Secondary | ICD-10-CM | POA: Diagnosis not present

## 2022-04-25 DIAGNOSIS — J441 Chronic obstructive pulmonary disease with (acute) exacerbation: Secondary | ICD-10-CM | POA: Diagnosis not present

## 2022-04-25 DIAGNOSIS — J9621 Acute and chronic respiratory failure with hypoxia: Secondary | ICD-10-CM | POA: Diagnosis not present

## 2022-04-25 DIAGNOSIS — I2510 Atherosclerotic heart disease of native coronary artery without angina pectoris: Secondary | ICD-10-CM | POA: Diagnosis not present

## 2022-04-25 DIAGNOSIS — R338 Other retention of urine: Secondary | ICD-10-CM | POA: Diagnosis not present

## 2022-04-25 DIAGNOSIS — I5033 Acute on chronic diastolic (congestive) heart failure: Secondary | ICD-10-CM | POA: Diagnosis not present

## 2022-04-25 DIAGNOSIS — E785 Hyperlipidemia, unspecified: Secondary | ICD-10-CM | POA: Diagnosis not present

## 2022-04-25 DIAGNOSIS — Z9181 History of falling: Secondary | ICD-10-CM | POA: Diagnosis not present

## 2022-04-25 DIAGNOSIS — R1314 Dysphagia, pharyngoesophageal phase: Secondary | ICD-10-CM | POA: Diagnosis not present

## 2022-04-25 DIAGNOSIS — N1831 Chronic kidney disease, stage 3a: Secondary | ICD-10-CM | POA: Diagnosis not present

## 2022-04-25 DIAGNOSIS — J440 Chronic obstructive pulmonary disease with acute lower respiratory infection: Secondary | ICD-10-CM | POA: Diagnosis not present

## 2022-04-25 DIAGNOSIS — H9193 Unspecified hearing loss, bilateral: Secondary | ICD-10-CM | POA: Diagnosis not present

## 2022-04-25 DIAGNOSIS — I130 Hypertensive heart and chronic kidney disease with heart failure and stage 1 through stage 4 chronic kidney disease, or unspecified chronic kidney disease: Secondary | ICD-10-CM | POA: Diagnosis not present

## 2022-04-25 DIAGNOSIS — G4733 Obstructive sleep apnea (adult) (pediatric): Secondary | ICD-10-CM | POA: Diagnosis not present

## 2022-04-25 DIAGNOSIS — Z87891 Personal history of nicotine dependence: Secondary | ICD-10-CM | POA: Diagnosis not present

## 2022-04-25 DIAGNOSIS — F32A Depression, unspecified: Secondary | ICD-10-CM | POA: Diagnosis not present

## 2022-04-25 DIAGNOSIS — Z7951 Long term (current) use of inhaled steroids: Secondary | ICD-10-CM | POA: Diagnosis not present

## 2022-04-25 NOTE — Patient Instructions (Signed)
Visit Information  Thank you for taking time to visit with me today. Please don't hesitate to contact me if I can be of assistance to you.   Following are the goals we discussed today:   Goals Addressed             This Visit's Progress    Receive Assistance Obtaining In-Home Care Services.   On track    Care Coordination Interventions:  Interventions Today    Flowsheet Row Most Recent Value  Chronic Disease Discussed/Reviewed   Chronic disease discussed/reviewed during today's visit Chronic Kidney Disease/End Stage Renal Disease (ESRD)  General Interventions   General Interventions Discussed/Reviewed General Interventions Discussed, General Interventions Reviewed, Durable Medical Equipment (DME), Health Screening, Community Resources, Doctor Visits  Doctor Visits Discussed/Reviewed Doctor Visits Discussed, Doctor Visits Reviewed, Annual Wellness Visits, PCP  Durable Medical Equipment (DME) Bed side commode, BP Cuff, Environmental consultant, Wheelchair, Estate agent  PCP/Specialist Visits Compliance with follow-up visit  Exercise Interventions   Exercise Discussed/Reviewed Exercise Discussed, Exercise Reviewed, Weight Managment, Assistive device use and maintanence  Weight Management Weight loss  Education Interventions   Education Provided Provided Verbal Education  Provided Verbal Education On Nutrition, Mental Health/Coping with Illness, Exercise, Medication, When to see the doctor  Mental Health Interventions   [place] Discussed, Mental Health Reviewed, Coping Strategies  Nutrition Interventions   Nutrition Discussed/Reviewed Nutrition Discussed, Nutrition Reviewed, Portion sizes, Decreasing sugar intake, Increaing proteins, Decreasing fats, Decreasing salt  Pharmacy Interventions   Pharmacy Dicussed/Reviewed Medication Adherence, Affording Medications  Safety Interventions   Safety Discussed/Reviewed Safety Discussed, Safety  Reviewed, Fall Risk, Home Safety  Home Safety Assistive Devices, Need for home safety assessment, Refer for community resources      Active Listening & Reflection Utilized.  Verbalization of Feelings Encouraged.  Emotional Support Provided. Caregiver Stress Acknowledged. Feelings of Caregiver Burnout Validated. Caregiver Resources Reviewed. Caregiver Support Groups Revisited. Self-Enrollment in Caregiver Support Group of Interest Emphasized. Problem Solving Interventions Implemented. Task-Centered Solutions Employed.  Solution-Focused Strategies Activated.  Acceptance & Commitment Therapy Performed. Cognitive Behavioral Therapy Initiated. Client-Centered Therapy Indicated. CSW Collaboration with Daughter, [name] to [place] for [employer] of Handicapped Accessible Wheelchair Ramp to [place] in [place] in [place].  CSW Collaboration with Daughter, [name] to [employer] in Pursuing Bariatric 3-In-1 Potty Chair, Due to [employer]. CSW Collaboration with Daughter, [name] to [employer] of Bariatric Wheelchair through [employer] [phone]). CSW Collaboration with Daughter, [name] to [place], through [facility] [phone]).  CSW Collaboration with Daughter, [name] to Encourage Increased Activity & Level of Exercise. Remain on Two Year Waiting List to [place], through [place] of [place] [phone]).      Our next appointment is by telephone on 05/10/2022 at 11:00 am.  Please call the care guide team at [phone] if you need to cancel or reschedule your appointment.   If you are experiencing a Mental Health or [place] or need someone to talk to, please call the Suicide and  Crisis Lifeline: 988 call the [place] National Suicide Prevention Lifeline: (419)735-1912 or TTY: 2346767113 TTY (716) 169-3647) to talk to a trained counselor call 1-800-273-TALK (toll free, 24 hour hotline) go to [facility] [place] [phone]) call the [facility]  Line: 4458125835 call 911  Patient verbalizes understanding of instructions and care plan provided today and agrees to view in [place]. Active MyChart status and patient understanding of how to access instructions and care plan via MyChart confirmed with patient.     Telephone follow up appointment with care management team member scheduled for: 05/10/2022 at 11:00 am.  Nat Christen, BSW, MSW, [place]  Licensed Clinical Social Worker  [place]  Mailing [place]. [address], [place], [place] [address] Physical Address-[address], [place], [place] [address] Toll Free Main # [phone] Fax # [phone] Cell # [phone] [name]

## 2022-04-25 NOTE — Patient Outreach (Signed)
Care Coordination   Follow Up Visit Note   04/25/2022  Name: [name] MRN: [id number] DOB: Jun 14, 1940  [name] is a 82 y.o. year old male who sees Luking, Elayne Snare, MD for primary care. I spoke with daughter, [name] by phone today.  What matters to the patients health and wellness today?  Receive Assistance Obtaining In-Home Care Services.   Goals Addressed             This Visit's Progress    Receive Assistance Obtaining In-Home Care Services.   On track    Care Coordination Interventions:  Interventions Today    Flowsheet Row Most Recent Value  Chronic Disease Discussed/Reviewed   Chronic disease discussed/reviewed during today's visit Chronic Kidney Disease/End Stage Renal Disease (ESRD)  General Interventions   General Interventions Discussed/Reviewed General Interventions Discussed, General Interventions Reviewed, Durable Medical Equipment (DME), Health Screening, Community Resources, Doctor Visits  Doctor Visits Discussed/Reviewed Doctor Visits Discussed, Doctor Visits Reviewed, Annual Wellness Visits, PCP  Durable Medical Equipment (DME) Bed side commode, BP Cuff, Environmental consultant, Wheelchair, Estate agent  PCP/Specialist Visits Compliance with follow-up visit  Exercise Interventions   Exercise Discussed/Reviewed Exercise Discussed, Exercise Reviewed, Weight Managment, Assistive device use and maintanence  Weight Management Weight loss  Education Interventions   Education Provided Provided Verbal Education  Provided Verbal Education On Nutrition, Mental Health/Coping with Illness, Exercise, Medication, When to see the doctor  Mental Health Interventions   [place] Discussed, Mental Health Reviewed, Coping Strategies  Nutrition Interventions   Nutrition Discussed/Reviewed Nutrition Discussed, Nutrition Reviewed, Portion sizes, Decreasing sugar intake, Increaing proteins, Decreasing fats,  Decreasing salt  Pharmacy Interventions   Pharmacy Dicussed/Reviewed Medication Adherence, Affording Medications  Safety Interventions   Safety Discussed/Reviewed Safety Discussed, Safety Reviewed, Fall Risk, Home Safety  Home Safety Assistive Devices, Need for home safety assessment, Refer for community resources      Active Listening & Reflection Utilized.  Verbalization of Feelings Encouraged.  Emotional Support Provided. Caregiver Stress Acknowledged. Feelings of Caregiver Burnout Validated. Caregiver Resources Reviewed. Caregiver Support Groups Revisited. Self-Enrollment in Caregiver Support Group of Interest Emphasized. Problem Solving Interventions Implemented. Task-Centered Solutions Employed.  Solution-Focused Strategies Activated.  Acceptance & Commitment Therapy Performed. Cognitive Behavioral Therapy Initiated. Client-Centered Therapy Indicated. CSW Collaboration with Daughter, [name] to [place] for [employer] of Handicapped Accessible Wheelchair Ramp to [place] in [place] in [place].  CSW Collaboration with Daughter, [name] to [employer] in Pursuing Bariatric 3-In-1 Potty Chair, Due to [employer]. CSW Collaboration with Daughter, [name] to [employer] of Bariatric Wheelchair through [employer] [phone]). CSW Collaboration with Daughter, [name] to [place], through [facility] [phone]).  CSW Collaboration with Daughter, [name] to Encourage Increased Activity & Level of Exercise. Remain on Two Year Waiting List to [place], through [place] of [place] [phone]).      SDOH assessments and interventions completed:  Yes.  Care Coordination Interventions:  Yes, provided.    Follow up plan: Follow up call scheduled for 05/10/2022 at 11:00 am.  Encounter Outcome:  Pt. Visit Completed.   Nat Christen, BSW, MSW, LCSW  Licensed Education officer, environmental Health System  Mailing [place] [address] [place], [place] [address] Physical Address[address] [place],  [place], [place] [address] [place] # [phone] Fax # [phone] Cell # [phone] [name]

## 2022-04-25 NOTE — Telephone Encounter (Signed)
May have verbal order as requested 

## 2022-04-25 NOTE — Telephone Encounter (Signed)
Simmie Davies PT from [facility] called and needs a verbal order for patient.  2 week, 1 week 8 activity as tolerated. The verbal order she asked for was not correct yesterday.  Please advise   Simmie Davies [phone]

## 2022-04-27 NOTE — Telephone Encounter (Signed)
Called and left message for Simmie Davies PT [phone] to call office back.

## 2022-04-29 ENCOUNTER — Telehealth: Payer: Self-pay | Admitting: Family Medicine

## 2022-04-29 NOTE — Telephone Encounter (Signed)
Nurses Recently I received request for additional documentation from [facility] regarding this patient Trying to clarify with family if they need for [place] to submit an order for #1 bariatric wheelchair? #2 heavy-duty walker, folding, with seat attachment #3 bariatric commode (should be noted that Medicare will only pay for bariatric commode if the patient is confined to a single room or there is no bathroom on the level he is on or there is no bathroom within the home he is at otherwise more than likely bariatric commode would be out-of-pocket)  Please verify which 1 of these above that they want to do send that back to me then we will pursue forward with making sure proper proper documentation is sent in thank you     CSW Collaboration with Daughter, [name] to Confirm Disinterest in Pursuing Bariatric 3-In-1 Potty Chair, Due to [employer]. CSW Collaboration with Daughter, [name] to [employer] of Bariatric Wheelchair through [employer] [phone]).

## 2022-04-30 NOTE — Telephone Encounter (Signed)
Documentation was completed on March 09, 2022 regarding toilet as well as walker please see, please send this along with the forms back to [facility]

## 2022-04-30 NOTE — Telephone Encounter (Signed)
[facility] stated they needed face to face notes to qualify him for the bariatric potty chair

## 2022-04-30 NOTE — Telephone Encounter (Signed)
I was able to read through the notes from [facility] [employer] states that more than likely bariatric commode will not be covered unless there is what was stated within the message So at this point we need information from the family Please see message, please touch base with family regarding this, patient should have some sort of regular follow-up visit sometime this spring  If family is able to give the information and I asked for in the message then if necessary I can go back in the mid one of the previous notes to put documentation into it thank you

## 2022-04-30 NOTE — Telephone Encounter (Signed)
Daughter states the bathroom is beside his room but he has a hard time because her toilets are low and he has a hard time getting off of it and they can not afford to get new higher toilets. He uses a walker/wheelchair to get around. Sh stated they nned the bariatirc one becuard the bedside commode he currently has and uses is not meant for his weight

## 2022-05-03 ENCOUNTER — Telehealth: Payer: Self-pay

## 2022-05-03 DIAGNOSIS — Z8701 Personal history of pneumonia (recurrent): Secondary | ICD-10-CM

## 2022-05-03 DIAGNOSIS — Z9981 Dependence on supplemental oxygen: Secondary | ICD-10-CM

## 2022-05-03 DIAGNOSIS — H9193 Unspecified hearing loss, bilateral: Secondary | ICD-10-CM

## 2022-05-03 DIAGNOSIS — J9610 Chronic respiratory failure, unspecified whether with hypoxia or hypercapnia: Secondary | ICD-10-CM

## 2022-05-03 DIAGNOSIS — R1314 Dysphagia, pharyngoesophageal phase: Secondary | ICD-10-CM

## 2022-05-03 DIAGNOSIS — N401 Enlarged prostate with lower urinary tract symptoms: Secondary | ICD-10-CM

## 2022-05-03 DIAGNOSIS — I2510 Atherosclerotic heart disease of native coronary artery without angina pectoris: Secondary | ICD-10-CM

## 2022-05-03 DIAGNOSIS — N1831 Chronic kidney disease, stage 3a: Secondary | ICD-10-CM

## 2022-05-03 DIAGNOSIS — G4733 Obstructive sleep apnea (adult) (pediatric): Secondary | ICD-10-CM

## 2022-05-03 DIAGNOSIS — Z6838 Body mass index (BMI) 38.0-38.9, adult: Secondary | ICD-10-CM

## 2022-05-03 DIAGNOSIS — J449 Chronic obstructive pulmonary disease, unspecified: Secondary | ICD-10-CM

## 2022-05-03 DIAGNOSIS — I5033 Acute on chronic diastolic (congestive) heart failure: Secondary | ICD-10-CM

## 2022-05-03 DIAGNOSIS — Z87891 Personal history of nicotine dependence: Secondary | ICD-10-CM

## 2022-05-03 DIAGNOSIS — R338 Other retention of urine: Secondary | ICD-10-CM

## 2022-05-03 DIAGNOSIS — Z7951 Long term (current) use of inhaled steroids: Secondary | ICD-10-CM

## 2022-05-03 DIAGNOSIS — J9621 Acute and chronic respiratory failure with hypoxia: Secondary | ICD-10-CM

## 2022-05-03 DIAGNOSIS — E785 Hyperlipidemia, unspecified: Secondary | ICD-10-CM

## 2022-05-03 DIAGNOSIS — I130 Hypertensive heart and chronic kidney disease with heart failure and stage 1 through stage 4 chronic kidney disease, or unspecified chronic kidney disease: Secondary | ICD-10-CM

## 2022-05-03 DIAGNOSIS — N39498 Other specified urinary incontinence: Secondary | ICD-10-CM

## 2022-05-03 DIAGNOSIS — F32A Depression, unspecified: Secondary | ICD-10-CM

## 2022-05-03 NOTE — Telephone Encounter (Signed)
Nurses-I sympathize with [facility].  I would like for the patient to continue physical therapy but it is extremely important that the patient be on a more regular schedule where he is out of bed in the morning hours and up throughout the day and then to bed early in the evening if he desires.  This would allow physical therapy to do their job.  If family cannot do this then we will have to stop physical therapy.  Please talk with physical therapy and with family regarding this message thanks

## 2022-05-03 NOTE — Telephone Encounter (Signed)
Chrys Racer from [facility] called for with BP readings on [employer]  Having issue schedule wise with pt went back pt was still in bed after 2:00 [name] is not waking pt up to give meds readings was 171/79 all he did he got out bed walk to living room last time after sitting down 164/74 . Pt is having days and nights mixed up when [name] lets PT come out to the lunch they always want after lunch when therapy gets there they can not do anything because same routine getting him out of bed give his meds but blood pressure to high for PT to be done. [facility] is trying but keep running into road blocks with way [name] schedules them to come out. Chrys Racer wants call back are they wanting pt to continue to keep going or discharge the pt due to them not being able to do PT.   Caroline at [employer]

## 2022-05-04 NOTE — Telephone Encounter (Signed)
Discussed with Chrys Racer PT at [facility].

## 2022-05-04 NOTE — Telephone Encounter (Signed)
Verbal orders given to [facility] PT at [facility].

## 2022-05-05 ENCOUNTER — Other Ambulatory Visit: Payer: Self-pay | Admitting: Family Medicine

## 2022-05-08 ENCOUNTER — Encounter: Payer: Self-pay | Admitting: "Endocrinology

## 2022-05-08 ENCOUNTER — Ambulatory Visit: Payer: Medicare Other | Admitting: "Endocrinology

## 2022-05-08 ENCOUNTER — Telehealth: Payer: Self-pay

## 2022-05-08 VITALS — BP 116/54 | HR 68 | Ht 73.0 in | Wt 306.0 lb

## 2022-05-08 DIAGNOSIS — I5033 Acute on chronic diastolic (congestive) heart failure: Secondary | ICD-10-CM | POA: Diagnosis not present

## 2022-05-08 DIAGNOSIS — Z87891 Personal history of nicotine dependence: Secondary | ICD-10-CM | POA: Diagnosis not present

## 2022-05-08 DIAGNOSIS — I130 Hypertensive heart and chronic kidney disease with heart failure and stage 1 through stage 4 chronic kidney disease, or unspecified chronic kidney disease: Secondary | ICD-10-CM | POA: Diagnosis not present

## 2022-05-08 DIAGNOSIS — R1314 Dysphagia, pharyngoesophageal phase: Secondary | ICD-10-CM | POA: Diagnosis not present

## 2022-05-08 DIAGNOSIS — J9621 Acute and chronic respiratory failure with hypoxia: Secondary | ICD-10-CM | POA: Diagnosis not present

## 2022-05-08 DIAGNOSIS — Z9981 Dependence on supplemental oxygen: Secondary | ICD-10-CM | POA: Diagnosis not present

## 2022-05-08 DIAGNOSIS — N1831 Chronic kidney disease, stage 3a: Secondary | ICD-10-CM | POA: Diagnosis not present

## 2022-05-08 DIAGNOSIS — J449 Chronic obstructive pulmonary disease, unspecified: Secondary | ICD-10-CM | POA: Diagnosis not present

## 2022-05-08 DIAGNOSIS — E212 Other hyperparathyroidism: Secondary | ICD-10-CM | POA: Insufficient documentation

## 2022-05-08 DIAGNOSIS — I2510 Atherosclerotic heart disease of native coronary artery without angina pectoris: Secondary | ICD-10-CM | POA: Diagnosis not present

## 2022-05-08 DIAGNOSIS — J9610 Chronic respiratory failure, unspecified whether with hypoxia or hypercapnia: Secondary | ICD-10-CM | POA: Diagnosis not present

## 2022-05-08 DIAGNOSIS — H9193 Unspecified hearing loss, bilateral: Secondary | ICD-10-CM | POA: Diagnosis not present

## 2022-05-08 DIAGNOSIS — R338 Other retention of urine: Secondary | ICD-10-CM | POA: Diagnosis not present

## 2022-05-08 DIAGNOSIS — E785 Hyperlipidemia, unspecified: Secondary | ICD-10-CM | POA: Diagnosis not present

## 2022-05-08 DIAGNOSIS — Z7951 Long term (current) use of inhaled steroids: Secondary | ICD-10-CM | POA: Diagnosis not present

## 2022-05-08 DIAGNOSIS — G4733 Obstructive sleep apnea (adult) (pediatric): Secondary | ICD-10-CM | POA: Diagnosis not present

## 2022-05-08 DIAGNOSIS — F32A Depression, unspecified: Secondary | ICD-10-CM | POA: Diagnosis not present

## 2022-05-08 NOTE — Progress Notes (Signed)
Endocrinology Consult Note                                            05/08/2022, 2:58 PM   Subjective:    Patient ID: [name], male    DOB: 1940/07/28, PCP [name], MD   Past Medical History:  Diagnosis Date   Ankylosing spondylitis 11/07/2018   Asthma    BPH (benign prostatic hyperplasia)    CAD (coronary artery disease) 01/1995   CHF (congestive heart failure)    Clostridium difficile colitis 04/2005   Colitis 2011   COPD (chronic obstructive pulmonary disease)    Depression    Diverticulosis    DJD (degenerative joint disease)    Gastric ulcer 04/17/10   Three 30mm gastric ulcers, H.pylori serologies were negative   GERD (gastroesophageal reflux disease)    History of kidney stones    Hyperlipidemia    Hypertension    Idiopathic chronic inflammatory bowel disease 05/18/2010   left-sided UC   Kidney stone    Morbid obesity 03/12/2018   Obstructive sleep apnea    on Cpap   Reflux 02/1995   S/P endoscopy 07/24/10   retained gastric contents, benign bx   Past Surgical History:  Procedure Laterality Date   ANORECTAL MANOMETRY  2016   [facility]: concern for possible fissure. Noted pelvic floor dyssnyergy   BIOPSY  07/29/2017   Procedure: BIOPSY;  Surgeon: Daneil Dolin, MD;  [facility];  Service: Endoscopy;;  ascending and sigmoid colon   CARDIAC CATHETERIZATION     with stent   CARDIOVASCULAR STRESS TEST  07/21/2009   No scintigraphic evidence of inducible myocardial ischemia   CARPAL TUNNEL RELEASE Left 01/17/2017   Procedure: LEFT CARPAL TUNNEL RELEASE;  Surgeon: Carole Civil, MD;  Location: [facility];  Service: Orthopedics;  Laterality: Left;   CATARACT EXTRACTION, BILATERAL Bilateral    CERVICAL SPINE SURGERY     C4-5   COLONOSCOPY  04/2005   granularity and friability erosions from rectum to 40cm. Bx infection vs IBD. C. Diff positive at the time.    COLONOSCOPY  05/2010   [facility]: left-sided UC, bx with no dysplasia, shallow  diverticula   COLONOSCOPY [name] 11/07/2012   [id number] proctocolitis status post segmental biopsy/Sigmoid colon polyps removed as described above. Procedure compromisd by technical difficulties. bx: Inflammation limited to sigmoid and rectum on pathology.   COLONOSCOPY  04/2014   Dr. Nyoka Cowden at [facility]: well localized proctocolitis limited to sigmoid   COLONOSCOPY WITH PROPOFOL [name] 07/29/2017   diverticulosis in colon, three 4-6 mm polyps at splenic flexure and in cecum, one 10 mm polyp in rectum, abnormal rectum and sigmoid consistent with active UC   CORONARY STENT PLACEMENT  01/1995   ESOPHAGEAL DILATION  01/20/2022   Procedure: ESOPHAGEAL DILATION;  Surgeon: Otis Brace, MD;  Location: [facility];  Service: Gastroenterology;;   ESOPHAGOGASTRODUODENOSCOPY  07/24/2010   [id number] esophagus   ESOPHAGOGASTRODUODENOSCOPY  04/2014   Dr. Nyoka Cowden at [facility]: negative small bowel biopsies   ESOPHAGOGASTRODUODENOSCOPY (EGD) WITH PROPOFOL [name] 01/20/2022   Procedure: ESOPHAGOGASTRODUODENOSCOPY (EGD) WITH PROPOFOL;  Surgeon: Otis Brace, MD;  Location: [facility];  Service: Gastroenterology;  Laterality: [name];   FOOT SURGERY Bilateral two   KNEE SURGERY  two   POLYPECTOMY  07/29/2017   Procedure: POLYPECTOMY;  Surgeon: Daneil Dolin, MD;  [facility];  Service: Endoscopy;;  splenic flexure, ascending colon polyp;rectal   SHOULDER SURGERY  two   TONSILLECTOMY     TRANSTHORACIC ECHOCARDIOGRAM  03/23/2009   EF 60-65%, normal LV systolic function   ULNAR HEAD EXCISION Left 01/17/2017   Procedure: LEFT ULNAR HEAD RESECTION;  Surgeon: Carole Civil, MD;  Location: [facility];  Service: Orthopedics;  Laterality: Left;   Social History   Socioeconomic History   Marital status: Divorced    Spouse name: Not on file   Number of children: 2   Years of education: 12   Highest education level: 12th grade  Occupational History   Occupation: maintenance    Comment: retired   Tobacco Use   Smoking status: Former    Packs/day: 1.00    Years: 20.00    Additional pack years: 0.00    Total pack years: 20.00    Types: Cigarettes    Quit date: 09/30/1969    Years since quitting: 52.6    Passive exposure: Past   Smokeless tobacco: Never   Tobacco comments:    Verified by Daughter, [name]  Vaping Use   Vaping Use: Never used  Substance and Sexual Activity   Alcohol use: No   Drug use: No   Sexual activity: Not Currently    Partners: Female    Birth control/protection: None    Comment: spouse  Other Topics Concern   Not on file  Social History Narrative   Not on file   Social Determinants of Health   Financial Resource Strain: Low Risk  (03/21/2022)   Overall Financial Resource Strain (CARDIA)    Difficulty of Paying Living Expenses: Not hard at all  Food Insecurity: No Food Insecurity (03/21/2022)   Hunger Vital Sign    Worried About Running Out of Food in the Last Year: Never true    [place] in the Last Year: Never true  Transportation Needs: No Transportation Needs (03/21/2022)   PRAPARE - Hydrologist (Medical): No    Lack of Transportation (Non-Medical): No  Physical Activity: Inactive (02/20/2022)   Exercise Vital Sign    Days of Exercise per Week: 0 days    Minutes of Exercise per Session: 0 min  Stress: No Stress Concern Present (02/20/2022)   [place]    Feeling of Stress : Not at all  Social Connections: Moderately Integrated (02/20/2022)   Social Connection and Isolation Panel [NHANES]    Frequency of Communication with Friends and Family: More than three times a week    Frequency of Social Gatherings with Friends and Family: More than three times a week    Attends Religious Services: More than 4 times per year    Active Member of [employer] or Organizations: Yes    Attends Archivist Meetings: More than 4 times per year     Marital Status: Widowed   Family History  Problem Relation Age of Onset   Lung cancer Mother    Cancer Mother        breast   Diabetes Mother    Stroke Father    Hypertension Father    Hyperlipidemia Father    Kidney failure Brother    Other Child        blood infection   Colon cancer Neg Hx    Outpatient Encounter Medications as of 05/08/2022  Medication Sig   ergocalciferol (VITAMIN D2) 1.25 MG (  50000 UT) capsule Take 50,000 Units by mouth once a week.   acetaminophen (TYLENOL) 500 MG tablet Take 2 tablets (1,000 mg total) by mouth every 6 (six) hours as needed for headache (pain).   albuterol (VENTOLIN HFA) 108 (90 Base) MCG/ACT inhaler Inhale 2 puffs into the lungs every 6 (six) hours as needed for wheezing or shortness of breath.   ALPRAZolam (XANAX) 0.25 MG tablet Take 1 tablet (0.25 mg total) by mouth 2 (two) times daily as needed for anxiety.   amLODipine (NORVASC) 5 MG tablet Take one-half tablet po daily   aspirin EC 81 MG tablet Take 81 mg by mouth daily. Swallow whole.   carvedilol (COREG) 12.5 MG tablet Take 1 tablet (12.5 mg total) by mouth 2 (two) times daily with a meal.   DULoxetine (CYMBALTA) 60 MG capsule TAKE 1 CAPSULE BY MOUTH DAILY   hydrALAZINE (APRESOLINE) 100 MG tablet Take one half tablet po BID (Patient taking differently: Take 100 mg by mouth 2 (two) times daily. Take one half tablet po BID)   isosorbide mononitrate (IMDUR) 30 MG 24 hr tablet TAKE 3 TABLETS(90 MG) BY MOUTH DAILY   nitroGLYCERIN (NITROSTAT) 0.4 MG SL tablet ONE TABLET UNDER TONGUE AS NEEDED FOR CHEST PAIN EVERY 5 MINUTES AS DIRECTED (Patient taking differently: Place 0.4 mg under the tongue every 5 (five) minutes as needed for chest pain.)   pravastatin (PRAVACHOL) 40 MG tablet 1 qd (Patient taking differently: Take 40 mg by mouth daily. 1 qd)   pyridOXINE (VITAMIN B-6) 100 MG tablet Take 100 mg by mouth daily. (Patient not taking: Reported on 05/08/2022)   spironolactone (ALDACTONE) 25 MG  tablet Take 0.5 tablets (12.5 mg total) by mouth daily.   tamsulosin (FLOMAX) 0.4 MG CAPS capsule Take 1 capsule (0.4 mg total) by mouth daily.   torsemide (DEMADEX) 20 MG tablet Take 1 tablet (20 mg total) by mouth daily.   [DISCONTINUED] polyethylene glycol (MIRALAX / GLYCOLAX) 17 g packet Take 17 g by mouth daily as needed for moderate constipation.   No facility-administered encounter medications on file as of 05/08/2022.   ALLERGIES: No Known Allergies  VACCINATION STATUS: Immunization History  Administered Date(s) Administered   Fluad Quad(high Dose 65+) 11/10/2020, 12/21/2021   Influenza Split 11/18/2012   Influenza, High Dose Seasonal PF 11/06/2019   Influenza,inj,Quad PF,6+ Mos 12/03/2013, 11/18/2014, 10/15/2016, 11/05/2017   Influenza-Unspecified 11/17/2015   PFIZER(Purple Top)SARS-COV-2 Vaccination 03/21/2019, 07/05/2019   PNEUMOCOCCAL CONJUGATE-20 12/21/2021   Pneumococcal Conjugate-13 11/18/2014   Pneumococcal Polysaccharide-23 11/06/2018   Pneumococcal-Unspecified 07/05/2009   Td 07/05/2009   Zoster Recombinat (Shingrix) 01/19/2019    HPI [name] is 82 y.o. male who presents today with a medical history as above. he is being seen in consultation for hypercalcemia/hyperparathyroidism requested by [name], MD.  Patient is accompanied by his grown daughter.  History is obtained from the family as well as chart review. His medical records indicate several instances of hypercalcemia over the last 2 years, highest being 11.0 mg per DL.  His most recent labs show normal calcium at 9.1. On February 09, 2022, his calcium was 10.7 associated with high PTH of 69. This patient is known to have CKD stage 3-4. She reports remote past history of nephrolithiasis.  He never had bone density.  He does not control bladder, wears diapers. He has several medical problems including COPD with respiratory failure on portable oxygen supply. He is not on calcium supplements. He  is on vitamin D to 50,000 units  weekly.  Review of Systems  Constitutional:  markedly fluctuating body weight with recent weight , + fatigue, no subjective hyperthermia, no subjective hypothermia, + on portable oxygen supplement Eyes: no blurry vision, no xerophthalmia ENT: no sore throat, no nodules palpated in throat, no dysphagia/odynophagia, no hoarseness Cardiovascular: + Shortness of Breath, no palpitations, no leg swelling Respiratory: no cough, no shortness of breath Gastrointestinal: no Nausea/Vomiting/Diarhhea Musculoskeletal: no muscle/joint aches, + edema. Skin: no rashes Neurological: no tremors, no numbness, no tingling, no dizziness Psychiatric: no depression, no anxiety  Objective:       05/08/2022    9:48 AM 03/21/2022    6:17 PM 03/09/2022   11:15 AM  Vitals with BMI  Height 6\' 1"     Weight 306 lbs 303 lbs 3 oz 311 lbs 6 oz  BMI Q000111Q  Q000111Q  Systolic 99991111  [id number]  Diastolic 54  52  Pulse 68  69    BP (!) 116/54   Pulse 68   Ht 6\' 1"  (1.854 m)   Wt (!) 306 lb (138.8 kg)   BMI 40.37 kg/m   Wt Readings from Last 3 Encounters:  05/08/22 (!) 306 lb (138.8 kg)  03/21/22 (!) 303 lb 3.2 oz (137.5 kg)  03/09/22 (!) 311 lb 6.4 oz (141.3 kg)    Physical Exam  Constitutional:  Body mass index is 40.37 kg/m.,  + Using portable oxygen, not in acute distress.   Eyes: PERRLA, EOMI, no exophthalmos ENT: moist mucous membranes, + stiff neck from prior surgery, difficult to palpate for the thyroid.   no gross cervical lymphadenopathy Cardiovascular: normal precordial activity, Regular Rate and Rhythm, no Murmur/Rubs/Gallops Respiratory:  adequate breathing efforts, no gross chest deformity, Clear to auscultation bilaterally Gastrointestinal: abdomen soft, Non -tender, No distension, Bowel Sounds present, no gross organomegaly Musculoskeletal: no gross deformities, strength intact in all four extremities, +peripheral edema Skin: moist, warm, no rashes Neurological: no  tremor with outstretched hands, Deep tendon reflexes normal in bilateral lower extremities.  CMP ( most recent) CMP     Component Value Date/Time   NA 142 03/16/2022 1659   K 5.0 03/16/2022 1659   CL 104 03/16/2022 1659   CO2 22 03/16/2022 1659   GLUCOSE 109 (H) 03/16/2022 1659   GLUCOSE 104 (H) 02/13/2022 1830   BUN 26 03/16/2022 1659   CREATININE 1.32 (H) 03/16/2022 1659   CREATININE 1.09 06/08/2015 0907   CALCIUM 10.0 03/16/2022 1659   PROT 6.6 02/13/2022 1830   PROT 7.1 02/09/2022 1619   ALBUMIN 2.9 (L) 02/13/2022 1830   ALBUMIN 3.8 02/09/2022 1619   AST 78 (H) 02/13/2022 1830   ALT 53 (H) 02/13/2022 1830   ALKPHOS 70 02/13/2022 1830   BILITOT 0.5 02/13/2022 1830   BILITOT 0.3 02/09/2022 1619   GFRNONAA 41 (L) 02/13/2022 1830   GFRAA 85 12/07/2019 1500     Diabetic Labs (most recent): Lab Results  Component Value Date   HGBA1C 6.3 (H) 07/11/2021   HGBA1C 6.1 (H) 06/23/2020   HGBA1C 5.9 (H) 12/22/2018    February 09, 2022: PTH 69  Lipid Panel     Component Value Date/Time   CHOL 198 12/21/2021 1614   TRIG 294 (H) 12/21/2021 1614   HDL 51 12/21/2021 1614   CHOLHDL 3.9 12/21/2021 1614   CHOLHDL 3.2 06/08/2015 0907   VLDL 33 (H) 06/08/2015 0907   LDLCALC 98 12/21/2021 1614   LABVLDL 49 (H) 12/21/2021 1614      Assessment & Plan:   1. Other  hyperparathyroidism   - GRANITE DOWSETT  is being seen at a kind request of Luking, Elayne Snare, MD. - I have reviewed his available  records and clinically evaluated the patient. - Based on these reviews, he has several instances of hypercalcemia associated at least on 1 occasion with high PTH of 69.  This is on the background of stage 3-4 CKD. In light of his urinary incontinence, not practical to measure 24-hour urine calcium.  He will be considered for bone density along with repeat labs for PTH, PTH related peptide, calcium, serum magnesium and phosphorus.  Even if this patient is confirmed to have primary  hyperparathyroidism, he is not a suitable surgical candidate.  If he is confirmed to have this diagnosis, he will be considered for Sensipar intervention. He is advised to stay away from calcium supplements, however will continue to benefit from vitamin D treatment.  - I did not initiate any new prescriptions today. - he is advised to maintain close follow up with [name], MD for primary care needs.   - Time spent with the patient: 45 minutes, of which >50% was spent in  counseling him about his hypercalcemia/hyperparathyroidism and the rest in obtaining information about his symptoms, reviewing his previous labs/studies ( including abstractions from other facilities),  evaluations, and treatments,  and developing a plan to confirm diagnosis and long term treatment based on the latest standards of care/guidelines; and documenting his care.  [name] participated in the discussions, expressed understanding, and voiced agreement with the above plans.  All questions were answered to his satisfaction. he is encouraged to contact clinic should he have any questions or concerns prior to his return visit.  Follow up plan: Return in about 2 weeks (around 05/22/2022) for F/U with Pre-visit Labs, DXA Scan B4 NV.   Glade Lloyd, MD [facility] [address] [place], [place] [address] Phone: [phone]  Fax: [phone]     05/08/2022, 2:58 PM  This note was partially dictated with voice recognition software. Similar sounding words can be transcribed inadequately or may not  be corrected upon review.

## 2022-05-08 NOTE — Telephone Encounter (Signed)
If ongoing elevated blood pressure recommend follow-up office visit

## 2022-05-08 NOTE — Telephone Encounter (Signed)
Stephen from [facility] calling no Pt blood pressure reading 165/60. The daughter was out and about and they was in [place] when PT come out waiting on them to get back when they got back his blood pressure was up. Said Dr is really not feeding him properly fast food etc.

## 2022-05-09 NOTE — Telephone Encounter (Signed)
Spoke with patient daughter states patient BP readings have been ok at around 116/70's and has scheduled an appt for Tuesday next week to follow up.

## 2022-05-10 ENCOUNTER — Ambulatory Visit: Payer: Self-pay | Admitting: *Deleted

## 2022-05-10 ENCOUNTER — Encounter: Payer: Self-pay | Admitting: *Deleted

## 2022-05-10 NOTE — Patient Instructions (Signed)
Visit Information  Thank you for taking time to visit with me today. Please don't hesitate to contact me if I can be of assistance to you.   Following are the goals we discussed today:   Goals Addressed             This Visit's Progress    COMPLETED: Receive Assistance Obtaining In-Home Care Services.   On track    Care Coordination Interventions:  Interventions Today    Flowsheet Row Most Recent Value  Chronic Disease Discussed/Reviewed   Chronic disease discussed/reviewed during today's visit Chronic Kidney Disease/End Stage Renal Disease (ESRD)  General Interventions   General Interventions Discussed/Reviewed General Interventions Discussed, General Interventions Reviewed, Durable Medical Equipment (DME), Health Screening, Community Resources, Doctor Visits  Doctor Visits Discussed/Reviewed Doctor Visits Discussed, Doctor Visits Reviewed, Annual Wellness Visits, PCP  Durable Medical Equipment (DME) Bed side commode, BP Cuff, Environmental consultant, Wheelchair, Estate agent  PCP/Specialist Visits Compliance with follow-up visit  Exercise Interventions   Exercise Discussed/Reviewed Exercise Discussed, Exercise Reviewed, Weight Managment, Assistive device use and maintanence  Weight Management Weight loss  Education Interventions   Education Provided Provided Verbal Education  Provided Verbal Education On Nutrition, Mental Health/Coping with Illness, Exercise, Medication, When to see the doctor  Mental Health Interventions   [place] Discussed, Mental Health Reviewed, Coping Strategies  Nutrition Interventions   Nutrition Discussed/Reviewed Nutrition Discussed, Nutrition Reviewed, Portion sizes, Decreasing sugar intake, Increaing proteins, Decreasing fats, Decreasing salt  Pharmacy Interventions   Pharmacy Dicussed/Reviewed Medication Adherence, Affording Medications  Safety Interventions   Safety Discussed/Reviewed Safety Discussed,  Safety Reviewed, Fall Risk, Home Safety  Home Safety Assistive Devices, Need for home safety assessment, Refer for community resources      Active Listening & Reflection Utilized.  Verbalization of Feelings Encouraged.  Emotional Support Provided. Caregiver Stress Acknowledged. Feelings of Caregiver Burnout Validated. Caregiver Resources Reviewed. Caregiver Support Groups Revisited. Self-Enrollment in Caregiver Support Group of Interest Emphasized. Problem Solving Interventions Implemented. Task-Centered Solutions Employed.  Solution-Focused Strategies Activated.  Acceptance & Commitment Therapy Performed. Cognitive Behavioral Therapy Initiated. Client-Centered Therapy Indicated. CSW Collaboration with Daughter, [name] to [employer], through [facility] [phone]).  CSW Collaboration with Daughter, [name] to Encourage More Structured Schedule for Patient, to Reduce Anxiety, Confusion & Irritation. CSW Collaboration with Daughter, [name] to Remind Her of Two Year Waiting List to [place], through [place] [facility] [place] [phone]). CSW Collaboration with Daughter, [name] to [employer] with CSW [phone]), if She Has Questions, Needs Assistance, or If Additional Social Work Needs Are Identified in The Near Future.      Please call the care guide team at [phone] if you need to cancel or reschedule your appointment.   If you are experiencing a Mental Health or [place] or need someone to talk to, please call the Suicide and Crisis Lifeline: 988 call the [place] National Suicide Prevention Lifeline: 437-539-2411 or TTY: (682)108-4261 TTY 682-777-5988) to talk to a trained counselor call 1-800-273-TALK (toll free, 24 hour hotline) go to [facility] [address] [place] [phone]) call the [place]: [phone] call 911  Patient verbalizes understanding of instructions and care plan provided today and agrees to view in [place]. Active MyChart status and patient understanding of how to access instructions and care plan via MyChart  confirmed with patient.     No further follow up required.  Nat Christen, BSW, MSW, LCSW  Licensed Education officer, environmental Health System  Mailing [place] [address] [place], [place] [address] Physical Address-[address], [place], [place] [address] Toll Free Main # [phone] Fax # [phone] Cell # [phone] [name]

## 2022-05-10 NOTE — Patient Outreach (Signed)
  Care Coordination   Follow Up Visit Note   05/10/2022  Name: [name] MRN: [id number] DOB: Jun 22, 1940  [name] is a 82 y.o. year old male who sees Luking, Elayne Snare, MD for primary care. I spoke with daughter, [name] by phone today.  What matters to the patients health and wellness today?  Receive Assistance Obtaining In-Home Care Services.   Goals Addressed             This Visit's Progress    COMPLETED: Receive Assistance Obtaining In-Home Care Services.   On track    Care Coordination Interventions:  Interventions Today    Flowsheet Row Most Recent Value  Chronic Disease Discussed/Reviewed   Chronic disease discussed/reviewed during today's visit Chronic Kidney Disease/End Stage Renal Disease (ESRD)  General Interventions   General Interventions Discussed/Reviewed General Interventions Discussed, General Interventions Reviewed, Durable Medical Equipment (DME), Health Screening, Community Resources, Doctor Visits  Doctor Visits Discussed/Reviewed Doctor Visits Discussed, Doctor Visits Reviewed, Annual Wellness Visits, PCP  Durable Medical Equipment (DME) Bed side commode, BP Cuff, Environmental consultant, Wheelchair, Estate agent  PCP/Specialist Visits Compliance with follow-up visit  Exercise Interventions   Exercise Discussed/Reviewed Exercise Discussed, Exercise Reviewed, Weight Managment, Assistive device use and maintanence  Weight Management Weight loss  Education Interventions   Education Provided Provided Verbal Education  Provided Verbal Education On Nutrition, Mental Health/Coping with Illness, Exercise, Medication, When to see the doctor  Mental Health Interventions   [place] Discussed, Mental Health Reviewed, Coping Strategies  Nutrition Interventions   Nutrition Discussed/Reviewed Nutrition Discussed, Nutrition Reviewed, Portion sizes, Decreasing sugar intake, Increaing proteins, Decreasing  fats, Decreasing salt  Pharmacy Interventions   Pharmacy Dicussed/Reviewed Medication Adherence, Affording Medications  Safety Interventions   Safety Discussed/Reviewed Safety Discussed, Safety Reviewed, Fall Risk, Home Safety  Home Safety Assistive Devices, Need for home safety assessment, Refer for community resources      Active Listening & Reflection Utilized.  Verbalization of Feelings Encouraged.  Emotional Support Provided. Caregiver Stress Acknowledged. Feelings of Caregiver Burnout Validated. Caregiver Resources Reviewed. Caregiver Support Groups Revisited. Self-Enrollment in Caregiver Support Group of Interest Emphasized. Problem Solving Interventions Implemented. Task-Centered Solutions Employed.  Solution-Focused Strategies Activated.  Acceptance & Commitment Therapy Performed. Cognitive Behavioral Therapy Initiated. Client-Centered Therapy Indicated. CSW Collaboration with Daughter, [name] to [place], through [facility] [phone]).  CSW Collaboration with Daughter, [name] to Encourage More Structured Schedule for Patient, to Reduce Anxiety, Confusion & Irritation. CSW Collaboration with Daughter, [name] to Remind Her of Two Year Waiting List to [place], through [place] [facility] [place] [phone]). CSW Collaboration with Daughter, [name] to [employer] with CSW [phone]), if She Has Questions, Needs Assistance, or If Additional Social Work Needs Are Identified in The Near Future.      SDOH assessments and interventions completed:  Yes.  Care Coordination Interventions:  Yes, provided.   Follow up plan: No further intervention required.   Encounter Outcome:  Pt. Visit Completed.   Nat Christen, BSW, MSW, LCSW  Licensed Engineer, petroleum Health System  Mailing [place] [address] [place], [place] [address] Physical Address-[address] [place], [place] [address] Toll Free Main # [phone] Fax # [phone] Cell # [phone] [name]

## 2022-05-15 ENCOUNTER — Ambulatory Visit (HOSPITAL_COMMUNITY)
Admission: RE | Admit: 2022-05-15 | Discharge: 2022-05-15 | Disposition: A | Discharge status: Home / Self Care | Payer: Medicare Other | Source: Ambulatory Visit | Attending: "Endocrinology | Admitting: "Endocrinology

## 2022-05-15 ENCOUNTER — Ambulatory Visit (INDEPENDENT_AMBULATORY_CARE_PROVIDER_SITE_OTHER): Payer: Medicare Other | Admitting: Family Medicine

## 2022-05-15 VITALS — BP 110/74 | Wt 308.2 lb

## 2022-05-15 DIAGNOSIS — E559 Vitamin D deficiency, unspecified: Secondary | ICD-10-CM | POA: Diagnosis not present

## 2022-05-15 DIAGNOSIS — M85852 Other specified disorders of bone density and structure, left thigh: Secondary | ICD-10-CM | POA: Diagnosis not present

## 2022-05-15 DIAGNOSIS — E785 Hyperlipidemia, unspecified: Secondary | ICD-10-CM | POA: Diagnosis not present

## 2022-05-15 DIAGNOSIS — L570 Actinic keratosis: Secondary | ICD-10-CM | POA: Diagnosis not present

## 2022-05-15 DIAGNOSIS — E212 Other hyperparathyroidism: Secondary | ICD-10-CM | POA: Diagnosis not present

## 2022-05-15 DIAGNOSIS — R7303 Prediabetes: Secondary | ICD-10-CM | POA: Diagnosis not present

## 2022-05-15 DIAGNOSIS — Z79899 Other long term (current) drug therapy: Secondary | ICD-10-CM | POA: Diagnosis not present

## 2022-05-15 DIAGNOSIS — I10 Essential (primary) hypertension: Secondary | ICD-10-CM | POA: Diagnosis not present

## 2022-05-15 DIAGNOSIS — M8589 Other specified disorders of bone density and structure, multiple sites: Secondary | ICD-10-CM | POA: Diagnosis not present

## 2022-05-15 NOTE — Progress Notes (Addendum)
   Subjective:    Patient ID: [name], male    DOB: 1940-12-01, 82 y.o.   MRN: [id number]  HPI Hyperlipidemia, unspecified hyperlipidemia type - Plan: Lipid panel  Essential hypertension - Plan: Basic Metabolic Panel (7)  High risk medication use - Plan: Hepatic Function Panel  Prediabetes - Plan: Hemoglobin A1c  With a large number of issues We will do the best we can and documenting this   Encounter to include everything that was mentioned within the because of the limits of information Previous labs visits discussed Medication list medicine by medicine was discussed in detail including the purpose of the medicines Patient's blood pressure was rechecked 3 times Discussed with social situation currently try to do the best he can and taking good care of him Patient's mood is overall doing well but having some anxiety related issues Patient has heart failure issues under fair control currently    Review of Systems     Objective:   Physical Exam  General-in no acute distress Eyes-no discharge Lungs-respiratory rate normal, CTA CV-no murmurs,RRR Extremities skin warm dry mild pedal edema-does have compression stockings Neuro grossly normal Behavior normal, alert  At the keratosis noted on the arm     Assessment & Plan:   Due to frequent falls he is confined to 1 room and there is no bathroom within that room there is no bathroom connecting to that room.  He needs a geriatric potty chair  COPD on oxygen stable continue current measures  Sleep apnea does not always use the mask properly will connect with [employer] to see if there is options that they could try differently  Center well as his home health and occupational therapy  Heart failure is stable continue current measures  Depression under good control with his Cymbalta  Anxiety family is managing through behavioral techniques  BPH continue tamsulosin  Peripheral edema continue torsemide  Actinic  keratosis referral to dermatology Will set him to do a follow-up visit in approximately 3 months

## 2022-05-15 NOTE — Progress Notes (Signed)
   Subjective:    Patient ID: [name], male    DOB: 1940-07-13, 82 y.o.   MRN: [id number]  HPI    Review of Systems     Objective:   Physical Exam        Assessment & Plan:

## 2022-05-18 ENCOUNTER — Telehealth: Payer: Self-pay | Admitting: Family Medicine

## 2022-05-18 ENCOUNTER — Encounter: Payer: Self-pay | Admitting: *Deleted

## 2022-05-18 ENCOUNTER — Ambulatory Visit: Payer: Self-pay | Admitting: *Deleted

## 2022-05-18 DIAGNOSIS — G4733 Obstructive sleep apnea (adult) (pediatric): Secondary | ICD-10-CM | POA: Diagnosis not present

## 2022-05-18 DIAGNOSIS — E785 Hyperlipidemia, unspecified: Secondary | ICD-10-CM | POA: Diagnosis not present

## 2022-05-18 DIAGNOSIS — I509 Heart failure, unspecified: Secondary | ICD-10-CM | POA: Diagnosis not present

## 2022-05-18 DIAGNOSIS — Z87891 Personal history of nicotine dependence: Secondary | ICD-10-CM | POA: Diagnosis not present

## 2022-05-18 DIAGNOSIS — J9621 Acute and chronic respiratory failure with hypoxia: Secondary | ICD-10-CM | POA: Diagnosis not present

## 2022-05-18 DIAGNOSIS — J449 Chronic obstructive pulmonary disease, unspecified: Secondary | ICD-10-CM | POA: Diagnosis not present

## 2022-05-18 DIAGNOSIS — J9610 Chronic respiratory failure, unspecified whether with hypoxia or hypercapnia: Secondary | ICD-10-CM | POA: Diagnosis not present

## 2022-05-18 DIAGNOSIS — I2510 Atherosclerotic heart disease of native coronary artery without angina pectoris: Secondary | ICD-10-CM | POA: Diagnosis not present

## 2022-05-18 DIAGNOSIS — N1831 Chronic kidney disease, stage 3a: Secondary | ICD-10-CM | POA: Diagnosis not present

## 2022-05-18 DIAGNOSIS — Z9981 Dependence on supplemental oxygen: Secondary | ICD-10-CM | POA: Diagnosis not present

## 2022-05-18 DIAGNOSIS — R1314 Dysphagia, pharyngoesophageal phase: Secondary | ICD-10-CM | POA: Diagnosis not present

## 2022-05-18 DIAGNOSIS — I5033 Acute on chronic diastolic (congestive) heart failure: Secondary | ICD-10-CM | POA: Diagnosis not present

## 2022-05-18 DIAGNOSIS — H9193 Unspecified hearing loss, bilateral: Secondary | ICD-10-CM | POA: Diagnosis not present

## 2022-05-18 DIAGNOSIS — I130 Hypertensive heart and chronic kidney disease with heart failure and stage 1 through stage 4 chronic kidney disease, or unspecified chronic kidney disease: Secondary | ICD-10-CM | POA: Diagnosis not present

## 2022-05-18 DIAGNOSIS — F32A Depression, unspecified: Secondary | ICD-10-CM | POA: Diagnosis not present

## 2022-05-18 DIAGNOSIS — R338 Other retention of urine: Secondary | ICD-10-CM | POA: Diagnosis not present

## 2022-05-18 DIAGNOSIS — M6281 Muscle weakness (generalized): Secondary | ICD-10-CM | POA: Diagnosis not present

## 2022-05-18 DIAGNOSIS — Z7951 Long term (current) use of inhaled steroids: Secondary | ICD-10-CM | POA: Diagnosis not present

## 2022-05-18 NOTE — Telephone Encounter (Signed)
[facility]- needs prescription for bedside commode  stating 3 and 1 size 27 in with because her bathroom and commode is too small for patient to bring his wheelchair or wheeler in there and her commode is too small for him to use. This needs to be sent to [facility] as soon as possible.

## 2022-05-18 NOTE — Patient Outreach (Signed)
Care Coordination   Follow Up Visit Note   05/18/2022  Name: [name] MRN: [id number] DOB: [birth year]  [name] is a 82 y.o. year old male who sees Luking, [name], MD for primary care. I spoke with daughter, [name] by phone today.  What matters to the patients health and wellness today?  Receive Assistance Obtaining In-Home Care Services.   Goals Addressed             This Visit's Progress    Receive Assistance Obtaining In-Home Care Services.   On track    Care Coordination Interventions:  Interventions Today    Flowsheet Row Most Recent Value  Chronic Disease Discussed/Reviewed   Chronic disease discussed/reviewed during today's visit Chronic Kidney Disease/End Stage Renal Disease (ESRD)  General Interventions   General Interventions Discussed/Reviewed General Interventions Discussed, General Interventions Reviewed, Durable Medical Equipment (DME), Health Screening, Community Resources, Doctor Visits  Doctor Visits Discussed/Reviewed Doctor Visits Discussed, Doctor Visits Reviewed, Annual Wellness Visits, PCP  Durable Medical Equipment (DME) Bed side commode, BP Cuff, Environmental consultant, Wheelchair, Physicist, medical  PCP/Specialist Visits Compliance with follow-up visit  Exercise Interventions   Exercise Discussed/Reviewed Exercise Discussed, Exercise Reviewed, Weight Managment, Assistive device use and maintanence  Weight Management Weight loss  Education Interventions   Education Provided Provided Verbal Education  Provided Verbal Education On Nutrition, Mental Health/Coping with Illness, Exercise, Medication, When to see the doctor  Mental Health Interventions   Mental Health Discussed/Reviewed Mental Health Discussed, Mental Health Reviewed, Coping Strategies  Nutrition Interventions   Nutrition Discussed/Reviewed Nutrition Discussed, Nutrition Reviewed, Portion sizes, Decreasing sugar intake, Increaing proteins, Decreasing fats,  Decreasing salt  Pharmacy Interventions   Pharmacy Dicussed/Reviewed Medication Adherence, Affording Medications  Safety Interventions   Safety Discussed/Reviewed Safety Discussed, Safety Reviewed, Fall Risk, Home Safety  Home Safety Assistive Devices, Need for home safety assessment, Refer for community resources      Active Listening & Reflection Utilized.  Verbalization of Feelings Encouraged.  Emotional Support Provided. Caregiver Stress Acknowledged. Feelings of Caregiver Burnout Validated. Caregiver Resources Reviewed. Caregiver Support Groups Revisited. Self-Enrollment in Caregiver Support Group of Interest Emphasized. Problem Solving Interventions Implemented. Task-Centered Solutions Employed.  Solution-Focused Strategies Activated.  Acceptance & Commitment Therapy Performed. Cognitive Behavioral Therapy Initiated. Client-Centered Therapy Indicated. CSW Collaboration with Daughter, [name] to [employer] of 3-in-1 Bariatric Bedside Commode for Patient.   ~ Ms. Alric Seton Reported Inability to Purchase 3-in-1/Bariatric Bedside                            Commode, Due to Cost & Fixed Income.   ~ Ms. Alric Seton Reported Insurance Denial, through [employer], for [employer] of 3-in-1/Bariatric Bedside Commode.   ~ Ms. Alric Seton Reported Order for 3-in-1/Bariatric Bedside Commode                        Must State "Medical Necessity for 3-in-1/Bariatric Bedside Commode                        for Patient Now Confined to One Room." CSW Collaboration with Primary Care Provider, Dr. Lilyan Punt with [facility] [phone]# [phone]), Via Secure Chat Message in Epic, to Request Specific Order for 3-in-1/Bariatric Bedside Commode. CSW Collaboration with  Primary Care Provider, Dr. Lilyan Punt with [facility] [phone]# [phone]), Via Secure Chat Message in Epic, to Request Specific Order for  3-in-1/Bariatric Bedside Commode Be Faxed to [facility] [phone]), for Processing. CSW Collaboration with Ron Parker, Home Health Occupational Therapist with [facility] [phone]), to Report Specific Order for 3-in-1/Bariatric Bedside Commode Has Been Requested, As CSW Awaits a Response from Primary Care Provider, Dr. Lilyan Punt with [facility] [phone]# [phone]). CSW Collaboration with Ron Parker, Home Health Occupational Therapist with [facility] [phone]), to Confirm Ability to Install 3-in-1/Bariatric Bedside Commode, Once Obtained. CSW Collaboration with Daughter, [name] to [facility] Continued Home Health Physical & Occupational Therapy Services, through [facility] [phone]).  CSW Collaboration with Daughter, [name] to Remind Her of Two Year Waiting List for Patient to Receive Home & [facility], through A[facility], [facility]ability & Transit Services of [place] [phone]). CSW Collaboration with Daughter, [name] to [employer] with CSW [phone]), if She Has Questions, Needs Assistance, or If Additional Social Work Needs Are Identified Between Now & Our Next Scheduled Follow-Up Outreach Call.      SDOH assessments and interventions completed:  Yes.  Care Coordination Interventions:  Yes, provided.   Follow up plan: Follow up call scheduled for 05/30/2022 at 2:15 pm.   Encounter Outcome:  Pt. Visit Completed.   Danford Bad, BSW, MSW, LCSW  Licensed Restaurant manager, fast food Health System  Mailing [place] [address], [place], [place] [address] Physical Address-[address] [place], [place] [address] Toll Free Main # [phone] Fax # [phone] Cell # [phone] [email or web address]

## 2022-05-18 NOTE — Patient Instructions (Signed)
Visit Information  Thank you for taking time to visit with me today. Please don't hesitate to contact me if I can be of assistance to you.   Following are the goals we discussed today:   Goals Addressed             This Visit's Progress    Receive Assistance Obtaining In-Home Care Services.   On track    Care Coordination Interventions:  Interventions Today    Flowsheet Row Most Recent Value  Chronic Disease Discussed/Reviewed   Chronic disease discussed/reviewed during today's visit Chronic Kidney Disease/End Stage Renal Disease (ESRD)  General Interventions   General Interventions Discussed/Reviewed General Interventions Discussed, General Interventions Reviewed, Durable Medical Equipment (DME), Health Screening, Community Resources, Doctor Visits  Doctor Visits Discussed/Reviewed Doctor Visits Discussed, Doctor Visits Reviewed, Annual Wellness Visits, PCP  Durable Medical Equipment (DME) Bed side commode, BP Cuff, Environmental consultant, Wheelchair, Physicist, medical  PCP/Specialist Visits Compliance with follow-up visit  Exercise Interventions   Exercise Discussed/Reviewed Exercise Discussed, Exercise Reviewed, Weight Managment, Assistive device use and maintanence  Weight Management Weight loss  Education Interventions   Education Provided Provided Verbal Education  Provided Verbal Education On Nutrition, Mental Health/Coping with Illness, Exercise, Medication, When to see the doctor  Mental Health Interventions   Mental Health Discussed/Reviewed Mental Health Discussed, Mental Health Reviewed, Coping Strategies  Nutrition Interventions   Nutrition Discussed/Reviewed Nutrition Discussed, Nutrition Reviewed, Portion sizes, Decreasing sugar intake, Increaing proteins, Decreasing fats, Decreasing salt  Pharmacy Interventions   Pharmacy Dicussed/Reviewed Medication Adherence, Affording Medications  Safety Interventions   Safety Discussed/Reviewed Safety Discussed, Safety  Reviewed, Fall Risk, Home Safety  Home Safety Assistive Devices, Need for home safety assessment, Refer for community resources      Active Listening & Reflection Utilized.  Verbalization of Feelings Encouraged.  Emotional Support Provided. Caregiver Stress Acknowledged. Feelings of Caregiver Burnout Validated. Caregiver Resources Reviewed. Caregiver Support Groups Revisited. Self-Enrollment in Caregiver Support Group of Interest Emphasized. Problem Solving Interventions Implemented. Task-Centered Solutions Employed.  Solution-Focused Strategies Activated.  Acceptance & Commitment Therapy Performed. Cognitive Behavioral Therapy Initiated. Client-Centered Therapy Indicated. CSW Collaboration with Daughter, [name] to [employer] of 3-in-1 Bariatric Bedside Commode for Patient.   ~ Ms. [name] Reported Inability to Purchase 3-in-1/Bariatric Bedside                        Commode, Due to Cost & Fixed Income.   ~ Ms. [name] Reported Insurance Denial, through [employer], for [employer] of 3-in-1/Bariatric Bedside Commode.   ~ Ms. [name] Reported Order for 3-in-1/Bariatric Bedside Commode                        Must State "Medical Necessity for 3-in-1/Bariatric Bedside Commode                        for Patient Now Confined to One Room." CSW Collaboration with Primary Care Provider, Dr. Lilyan Punt with [facility] [phone]# [phone]), Via Secure Chat Message in Epic, to Request Specific Order for 3-in-1/Bariatric Bedside Commode. CSW Collaboration with Primary Care Provider, Dr. Lilyan Punt with [facility] [phone]# [phone]), Via Secure Chat Message in Epic, to Request Specific Order for 3-in-1/Bariatric Bedside Commode Be Faxed to [facility] [phone]),  for Processing. CSW Collaboration with Ron Parker, Home Health Occupational Therapist with [facility] [phone]),  to Report Specific Order for 3-in-1/Bariatric Bedside Commode Has Been Requested, As CSW Awaits a Response from Primary Care Provider, Dr. Lilyan Punt with [facility] [phone]# [phone]). CSW Collaboration with Ron Parker, Home Health Occupational Therapist with [facility] [phone]), to Confirm Ability to Install 3-in-1/Bariatric Bedside Commode, Once Obtained. CSW Collaboration with Daughter, [name] to [facility] Continued Home Health Physical & Occupational Therapy Services, through [facility] [phone]).  CSW Collaboration with Daughter, [name] to Remind Her of Two Year Waiting List for Patient to Receive Home & [facility], through Aging, Disability & Transit Services of [place] [phone]). CSW Collaboration with Daughter, [name] to [employer] with CSW [phone]), if She Has Questions, Needs Assistance, or If Additional Social Work Needs Are Identified Between Now & Our Next Scheduled Follow-Up Outreach Call.      Our next appointment is by telephone on 05/30/2022 at 2:15 pm.  Please call the care guide team at [phone] if you need to cancel or reschedule your appointment.   If you are experiencing a Mental Health or Behavioral Health Crisis or need someone to talk to, please call the Suicide and Crisis Lifeline: 988 call the [place] National Suicide Prevention Lifeline: (218)478-0471 or TTY: (778)690-5775 TTY 463-836-4928) to talk to a trained counselor call 1-800-273-TALK (toll free, 24 hour hotline) go to [facility] [address] [place] [phone]) call the [facility] Crisis Line: [phone] call 911  Patient verbalizes understanding of instructions and care plan provided today and agrees to view in MyChart. Active MyChart status and patient understanding of how to access instructions and care plan via  MyChart confirmed with patient.     Telephone follow up appointment with care management team member scheduled for:  05/30/2022 at 2:15 pm.  Danford Bad, BSW, MSW, LCSW  Licensed Clinical Social Worker  [employer] [facility]  Mailing [place]. [address] [place], [place] [address] Physical Address[address], [place], [place] [address] Toll Free Main # [phone] Fax # [phone] Cell # [phone] [email or web address]

## 2022-05-19 ENCOUNTER — Encounter: Payer: Self-pay | Admitting: Family Medicine

## 2022-05-19 ENCOUNTER — Telehealth: Payer: Self-pay | Admitting: Family Medicine

## 2022-05-19 NOTE — Telephone Encounter (Signed)
Nurses Please go ahead and process orders He is currently staying where he should meet the Medicare guidelines for this So therefore lets go ahead and put in the order again I went ahead and dictated a note hopefully will help as well Please send communication to [employer] social worker letting her know that this is been initiated thank you  Below is a communication from [place] Good afternoon Dr. Gerda Diss, and happy Friday!!!! Patient's daughter, [name] is requesting an order for a 3-in-1/bariatric potty chair for patient and needs the order to specifically state, "3-in-1/bariatric potty chair for home use, as patient is now confined to only one room in trailer". Thanks, Mardene Celeste

## 2022-05-19 NOTE — Progress Notes (Signed)
Please have me sign this letter thank you nurses will handle forwarding it

## 2022-05-21 ENCOUNTER — Other Ambulatory Visit: Payer: Self-pay | Admitting: *Deleted

## 2022-05-21 DIAGNOSIS — J441 Chronic obstructive pulmonary disease with (acute) exacerbation: Secondary | ICD-10-CM | POA: Diagnosis not present

## 2022-05-21 DIAGNOSIS — J439 Emphysema, unspecified: Secondary | ICD-10-CM

## 2022-05-21 DIAGNOSIS — R0902 Hypoxemia: Secondary | ICD-10-CM | POA: Diagnosis not present

## 2022-05-21 DIAGNOSIS — J9601 Acute respiratory failure with hypoxia: Secondary | ICD-10-CM | POA: Diagnosis not present

## 2022-05-21 LAB — PTH-RELATED PEPTIDE: PTH-related peptide: <2 pmol/L

## 2022-05-21 LAB — PTH, INTACT AND CALCIUM
Calcium: 10.3 mg/dL — ABNORMAL HIGH (ref 8.6–10.2)
PTH: 60 pg/mL (ref 15–65)

## 2022-05-21 LAB — PHOSPHORUS: Phosphorus: 3.3 mg/dL (ref 2.8–4.1)

## 2022-05-21 LAB — MAGNESIUM: Magnesium: 2.1 mg/dL (ref 1.6–2.3)

## 2022-05-21 MED ORDER — ALBUTEROL SULFATE HFA 108 (90 BASE) MCG/ACT IN AERS
2.0000 | INHALATION_SPRAY | Freq: Four times a day (QID) | RESPIRATORY_TRACT | 1 refills | Status: DC | PRN
Start: 2022-05-21 — End: 2022-12-06

## 2022-05-21 NOTE — Telephone Encounter (Signed)
Please fill out this prescription and I will sign it Also with this prescription I dictated a letter late last week please print the letter I will sign that letter as well Both will need to be sent in

## 2022-05-22 NOTE — Telephone Encounter (Signed)
Signed and returned to nurses area thank you

## 2022-05-22 NOTE — Telephone Encounter (Signed)
Script and letter on office door waiting on signature.

## 2022-05-22 NOTE — Telephone Encounter (Signed)
See notes

## 2022-05-23 ENCOUNTER — Encounter: Payer: Self-pay | Admitting: "Endocrinology

## 2022-05-23 ENCOUNTER — Ambulatory Visit (INDEPENDENT_AMBULATORY_CARE_PROVIDER_SITE_OTHER): Payer: Medicare Other | Admitting: "Endocrinology

## 2022-05-23 ENCOUNTER — Telehealth: Payer: Self-pay | Admitting: Family Medicine

## 2022-05-23 VITALS — BP 134/62 | HR 68 | Ht 73.0 in | Wt 315.4 lb

## 2022-05-23 DIAGNOSIS — M8580 Other specified disorders of bone density and structure, unspecified site: Secondary | ICD-10-CM | POA: Diagnosis not present

## 2022-05-23 DIAGNOSIS — E559 Vitamin D deficiency, unspecified: Secondary | ICD-10-CM | POA: Diagnosis not present

## 2022-05-23 DIAGNOSIS — E212 Other hyperparathyroidism: Secondary | ICD-10-CM

## 2022-05-23 NOTE — Telephone Encounter (Signed)
Daughter [name] called stating patient weight is 315 now at specialist office. Has blood pressure was 134/68. He has been eating a lot of salt, pasta.sodium and she wants to know should she give him extra fluid pill. Also need a updated letter about seat belt that he can't wear due to his weight and breathing. Please advise 

## 2022-05-23 NOTE — Progress Notes (Unsigned)
05/23/2022, 3:37 PM  Endocrinology follow-up note   Subjective:    Patient ID: [name], male    DOB: March 22, 1940, PCP Babs Sciara, MD   Past Medical History:  Diagnosis Date   Ankylosing spondylitis 11/07/2018   Asthma    BPH (benign prostatic hyperplasia)    CAD (coronary artery disease) 01/1995   CHF (congestive heart failure)    Clostridium difficile colitis 04/2005   Colitis 2011   COPD (chronic obstructive pulmonary disease)    Depression    Diverticulosis    DJD (degenerative joint disease)    Gastric ulcer 04/17/10   Three 5mm gastric ulcers, H.pylori serologies were negative   GERD (gastroesophageal reflux disease)    History of kidney stones    Hyperlipidemia    Hypertension    Idiopathic chronic inflammatory bowel disease 05/18/2010   left-sided UC   Kidney stone    Morbid obesity 03/12/2018   Obstructive sleep apnea    on Cpap   Reflux 02/1995   S/P endoscopy 07/24/10   retained gastric contents, benign bx   Past Surgical History:  Procedure Laterality Date   ANORECTAL MANOMETRY  2016   [facility]: concern for possible fissure. Noted pelvic floor dyssnyergy   BIOPSY  07/29/2017   Procedure: BIOPSY;  Surgeon: Corbin Ade, MD;  Location: [facility];  Service: Endoscopy;;  ascending and sigmoid colon   CARDIAC CATHETERIZATION     with stent   CARDIOVASCULAR STRESS TEST  07/21/2009   No scintigraphic evidence of inducible myocardial ischemia   CARPAL TUNNEL RELEASE Left 01/17/2017   Procedure: LEFT CARPAL TUNNEL RELEASE;  Surgeon: Vickki Hearing, MD;  Location: [facility];  Service: Orthopedics;  Laterality: Left;   CATARACT EXTRACTION, BILATERAL Bilateral    CERVICAL SPINE SURGERY     C4-5   COLONOSCOPY  04/2005   granularity and friability erosions from rectum to 40cm. Bx infection vs IBD. C. Diff positive at the time.    COLONOSCOPY  05/2010   [facility]: left-sided UC, bx with no dysplasia,  shallow diverticula   COLONOSCOPY [name] 11/07/2012   UEA:[id number] proctocolitis status post segmental biopsy/Sigmoid colon polyps removed as described above. Procedure compromisd by technical difficulties. bx: Inflammation limited to sigmoid and rectum on pathology.   COLONOSCOPY  04/2014   Dr. Chilton Si at [facility]: well localized proctocolitis limited to sigmoid   COLONOSCOPY WITH PROPOFOL [name] 07/29/2017   diverticulosis in colon, three 4-6 mm polyps at splenic flexure and in cecum, one 10 mm polyp in rectum, abnormal rectum and sigmoid consistent with active UC   CORONARY STENT PLACEMENT  01/1995   ESOPHAGEAL DILATION  01/20/2022   Procedure: ESOPHAGEAL DILATION;  Surgeon: Kathi Der, MD;  Location: [facility];  Service: Gastroenterology;;   ESOPHAGOGASTRODUODENOSCOPY  07/24/2010   YNW:GNFAOZ esophagus   ESOPHAGOGASTRODUODENOSCOPY  04/2014   Dr. Chilton Si at [facility]: negative small bowel biopsies   ESOPHAGOGASTRODUODENOSCOPY (EGD) WITH PROPOFOL [name] 01/20/2022   Procedure: ESOPHAGOGASTRODUODENOSCOPY (EGD) WITH PROPOFOL;  Surgeon: Kathi Der, MD;  Location: [facility];  Service: Gastroenterology;  Laterality: [name];   FOOT SURGERY Bilateral two   KNEE SURGERY  two   POLYPECTOMY  07/29/2017   Procedure: POLYPECTOMY;  Surgeon: Corbin Ade, MD;  [facility]: [facility];  Service: Endoscopy;;  splenic flexure, ascending colon polyp;rectal   SHOULDER SURGERY  two   TONSILLECTOMY     TRANSTHORACIC ECHOCARDIOGRAM  03/23/2009   EF 60-65%, normal LV systolic function   ULNAR HEAD EXCISION Left 01/17/2017   Procedure: LEFT ULNAR HEAD RESECTION;  Surgeon: Vickki Hearing, MD;  Location: [facility];  Service: Orthopedics;  Laterality: Left;   Social History   Socioeconomic History   Marital status: Divorced    Spouse name: Not on file   Number of children: 2   Years of education: 12   Highest education level: 12th grade  Occupational History   Occupation: maintenance    Comment:  retired  Tobacco Use   Smoking status: Former    Packs/day: 1.00    Years: 20.00    Additional pack years: 0.00    Total pack years: 20.00    Types: Cigarettes    Quit date: 09/30/1969    Years since quitting: 52.6    Passive exposure: Past   Smokeless tobacco: Never   Tobacco comments:    Verified by Daughter, [name]  Vaping Use   Vaping Use: Never used  Substance and Sexual Activity   Alcohol use: No   Drug use: No   Sexual activity: Not Currently    Partners: Female    Birth control/protection: None    Comment: spouse  Other Topics Concern   Not on file  Social History Narrative   Not on file   Social Determinants of Health   Financial Resource Strain: Low Risk  (03/21/2022)   Overall Financial Resource Strain (CARDIA)    Difficulty of Paying Living Expenses: Not hard at all  Food Insecurity: No Food Insecurity (03/21/2022)   Hunger Vital Sign    Worried About Running Out of Food in the Last Year: Never true    Ran Out of Food in the Last Year: Never true  Transportation Needs: No Transportation Needs (03/21/2022)   PRAPARE - Administrator, Civil Service (Medical): No    Lack of Transportation (Non-Medical): No  Physical Activity: Inactive (02/20/2022)   Exercise Vital Sign    Days of Exercise per Week: 0 days    Minutes of Exercise per Session: 0 min  Stress: No Stress Concern Present (02/20/2022)   [employer] of Occupational Health - Occupational Stress Questionnaire    Feeling of Stress : Not at all  Social Connections: Moderately Integrated (02/20/2022)   Social Connection and Isolation Panel [NHANES]    Frequency of Communication with Friends and Family: More than three times a week    Frequency of Social Gatherings with Friends and Family: More than three times a week    Attends Religious Services: More than 4 times per year    Active Member of [employer] or Organizations: Yes    Attends Banker Meetings: More than 4 times per  year    Marital Status: Widowed   Family History  Problem Relation Age of Onset   Lung cancer Mother    Cancer Mother        breast   Diabetes Mother    Stroke Father    Hypertension Father    Hyperlipidemia Father    Kidney failure Brother    Other Child        blood infection   Colon cancer Neg Hx    Outpatient Encounter Medications as of 05/23/2022  Medication Sig   acetaminophen (TYLENOL) 500 MG tablet  Take 2 tablets (1,000 mg total) by mouth every 6 (six) hours as needed for headache (pain).   albuterol (VENTOLIN HFA) 108 (90 Base) MCG/ACT inhaler Inhale 2 puffs into the lungs every 6 (six) hours as needed for wheezing or shortness of breath.   ALPRAZolam (XANAX) 0.25 MG tablet Take 1 tablet (0.25 mg total) by mouth 2 (two) times daily as needed for anxiety.   amLODipine (NORVASC) 5 MG tablet Take one-half tablet po daily   aspirin EC 81 MG tablet Take 81 mg by mouth daily. Swallow whole.   carvedilol (COREG) 12.5 MG tablet Take 1 tablet (12.5 mg total) by mouth 2 (two) times daily with a meal.   DULoxetine (CYMBALTA) 60 MG capsule TAKE 1 CAPSULE BY MOUTH DAILY   ergocalciferol (VITAMIN D2) 1.25 MG (50000 UT) capsule Take 50,000 Units by mouth once a week.   hydrALAZINE (APRESOLINE) 100 MG tablet Take one half tablet po BID (Patient taking differently: Take 100 mg by mouth 2 (two) times daily. Take one half tablet po BID)   isosorbide mononitrate (IMDUR) 30 MG 24 hr tablet TAKE 3 TABLETS(90 MG) BY MOUTH DAILY   nitroGLYCERIN (NITROSTAT) 0.4 MG SL tablet ONE TABLET UNDER TONGUE AS NEEDED FOR CHEST PAIN EVERY 5 MINUTES AS DIRECTED (Patient taking differently: Place 0.4 mg under the tongue every 5 (five) minutes as needed for chest pain.)   pravastatin (PRAVACHOL) 40 MG tablet 1 qd (Patient taking differently: Take 40 mg by mouth daily. 1 qd)   pyridOXINE (VITAMIN B-6) 100 MG tablet Take 100 mg by mouth daily. (Patient not taking: Reported on 05/08/2022)   spironolactone (ALDACTONE) 25  MG tablet Take 0.5 tablets (12.5 mg total) by mouth daily.   tamsulosin (FLOMAX) 0.4 MG CAPS capsule Take 1 capsule (0.4 mg total) by mouth daily.   torsemide (DEMADEX) 20 MG tablet Take 1 tablet (20 mg total) by mouth daily.   No facility-administered encounter medications on file as of 05/23/2022.   ALLERGIES: No Known Allergies  VACCINATION STATUS: Immunization History  Administered Date(s) Administered   Fluad Quad(high Dose 65+) 11/10/2020, 12/21/2021   Influenza Split 11/18/2012   Influenza, High Dose Seasonal PF 11/06/2019   Influenza,inj,Quad PF,6+ Mos 12/03/2013, 11/18/2014, 10/15/2016, 11/05/2017   Influenza-Unspecified 11/17/2015   PFIZER(Purple Top)SARS-COV-2 Vaccination 03/21/2019, 07/05/2019   PNEUMOCOCCAL CONJUGATE-20 12/21/2021   Pneumococcal Conjugate-13 11/18/2014   Pneumococcal Polysaccharide-23 11/06/2018   Pneumococcal-Unspecified 07/05/2009   Td 07/05/2009   Zoster Recombinat (Shingrix) 01/19/2019    HPI YOON BARCA is 82 y.o. male who presents today with a medical history as above. he is being seen in follow-up after he was seen in consultation for hypercalcemia/hyperparathyroidism requested by Babs Sciara, MD.  Patient is accompanied by his grown daughter.  History is obtained from the family as well as chart review. His medical records indicate several instances of hypercalcemia over the last 2 years, highest being 11.0 mg per DL.  His most recent labs show normal calcium at 9.1. On February 09, 2022, his calcium was 10.7 associated with high PTH of 69. This patient is known to have CKD stage 3-4. She reports remote past history of nephrolithiasis.  He never had bone density.  He does not control bladder, wears diapers. His recent labs repeated after his last visit showed calcium better at 10.3, phosphorus 3.3, magnesium 2.1.  His DEXA scan shows osteopenia.  He has several medical problems including COPD with respiratory failure on portable oxygen  supply. He is not on calcium supplements.  He has no new complaints today. He is on vitamin D to 50,000 units weekly.  Review of Systems  Constitutional:  markedly fluctuating body weight with recent weight , + fatigue, no subjective hyperthermia, no subjective hypothermia, + on portable oxygen supplement Eyes: no blurry vision, no xerophthalmia ENT: no sore throat, no nodules palpated in throat, no dysphagia/odynophagia, no hoarseness   Objective:       05/23/2022   10:45 AM 05/15/2022   11:48 AM 05/08/2022    9:48 AM  Vitals with BMI  Height     Weight 315 lbs 6 oz 308 lbs 3 oz 306 lbs  BMI 41.62 40.67 40.38  Systolic 134 110 161  Diastolic 62 74 54  Pulse 68  68    BP 134/62   Pulse 68   Ht  (1.854 m)   Wt (!) 315 lb 6.4 oz (143.1 kg)   BMI 41.61 kg/m   Wt Readings from Last 3 Encounters:  05/23/22 (!) 315 lb 6.4 oz (143.1 kg)  05/15/22 (!) 308 lb 3.2 oz (139.8 kg)  05/08/22 (!) 306 lb (138.8 kg)    Physical Exam  Constitutional:  Body mass index is 41.61 kg/m.,  + Using portable oxygen, not in acute distress.   Eyes: PERRLA, EOMI, no exophthalmos ENT: moist mucous membranes, + stiff neck from prior surgery, difficult to palpate for the thyroid.   no gross cervical lymphadenopathy   CMP ( most recent) CMP     Component Value Date/Time   NA 142 03/16/2022 1659   K 5.0 03/16/2022 1659   CL 104 03/16/2022 1659   CO2 22 03/16/2022 1659   GLUCOSE 109 (H) 03/16/2022 1659   GLUCOSE 104 (H) 02/13/2022 1830   BUN 26 03/16/2022 1659   CREATININE 1.32 (H) 03/16/2022 1659   CREATININE 1.09 06/08/2015 0907   CALCIUM 10.3 (H) 05/15/2022 1238   PROT 6.6 02/13/2022 1830   PROT 7.1 02/09/2022 1619   ALBUMIN 2.9 (L) 02/13/2022 1830   ALBUMIN 3.8 02/09/2022 1619   AST 78 (H) 02/13/2022 1830   ALT 53 (H) 02/13/2022 1830   ALKPHOS 70 02/13/2022 1830   BILITOT 0.5 02/13/2022 1830   BILITOT 0.3 02/09/2022 1619   GFRNONAA 41 (L) 02/13/2022 1830   GFRAA 85  12/07/2019 1500     Diabetic Labs (most recent): Lab Results  Component Value Date   HGBA1C 6.3 (H) 07/11/2021   HGBA1C 6.1 (H) 06/23/2020   HGBA1C 5.9 (H) 12/22/2018    February 09, 2022: PTH 69  Lipid Panel     Component Value Date/Time   CHOL 198 12/21/2021 1614   TRIG 294 (H) 12/21/2021 1614   HDL 51 12/21/2021 1614   CHOLHDL 3.9 12/21/2021 1614   CHOLHDL 3.2 06/08/2015 0907   VLDL 33 (H) 06/08/2015 0907   LDLCALC 98 12/21/2021 1614   LABVLDL 49 (H) 12/21/2021 1614      Assessment & Plan:   1. Other hyperparathyroidism  -I have reviewed his new and available records with him and his daughter.  He has mild hypercalcemia at 10.3.  Previously, he has had several instances of hypercalcemia associated at least on 1 occasion with high PTH of 69.  His current labs show calcium of 10.3 and PTH of 60.  This is on the background of stage 3-4 CKD. This is still consistent with mild primary hyperparathyroidism. In light of his urinary incontinence, not practical to measure 24-hour urine calcium.  His bone density shows osteopenia.  He is not a surgical candidate.  He will be kept on expectant management only for now. Even if this patient is confirmed to have primary hyperparathyroidism, he is not a suitable surgical candidate.  If he continues to have clinically significant hypercalcemia, he will be considered for Sensipar intervention.    He is advised to stay away from calcium supplements, however will continue to benefit from vitamin D treatment.   - he is advised to maintain close follow up with Babs Sciara, MD for primary care needs.  I spent  22  minutes in the care of the patient today including review of labs from Thyroid Function, CMP, and other relevant labs ; imaging/biopsy records (current and previous including abstractions from other facilities); face-to-face time discussing  his lab results and symptoms, medications doses, his options of short and long term  treatment based on the latest standards of care / guidelines;   and documenting the encounter.  [name]  participated in the discussions, expressed understanding, and voiced agreement with the above plans.  All questions were answered to his satisfaction. he is encouraged to contact clinic should he have any questions or concerns prior to his return visit.   Follow up plan: Return in about 6 months (around 11/22/2022) for F/U with Pre-visit Labs.   Marquis Lunch, MD [facility] [address] [place], [place] [address] Phone: [phone]  Fax: [phone]     05/23/2022, 3:37 PM  This note was partially dictated with voice recognition software. Similar sounding words can be transcribed inadequately or may not  be corrected upon review.

## 2022-05-23 NOTE — Telephone Encounter (Signed)
Daughter [name] called stating patient weight is 315 now at specialist office. Has blood pressure was 134/68. He has been eating a lot of salt, pasta.sodium and she wants to know should she give him extra fluid pill. Also need a updated letter about seat belt that he can't wear due to his weight and breathing. Please advise

## 2022-05-24 ENCOUNTER — Telehealth: Payer: Self-pay

## 2022-05-24 NOTE — Telephone Encounter (Signed)
Prescription Request  05/24/2022  LOV: Visit date not found  What is the name of the medication or equipment? D2?  torsemide (DEMADEX) 20 MG tablet  Pt needs refill and   Have you contacted your pharmacy to request a refill? Yes   Which pharmacy would you like this sent to?  [id number] - [place], [place] - [address] [place] [address] Phone: [phone] Fax: [phone]    Patient notified that their request is being sent to the clinical staff for review and that they should receive a response within 2 business days.   Please advise at Mobile [phone] (mobile)

## 2022-05-24 NOTE — Telephone Encounter (Signed)
Pt is wanting a letter needing to explain that [name] can not wear a seat belt and Dr Lorin Picket had wrote a letter before and the daughter washed the letter. Needs new letter stating that he can not wear a seat belt due to his oxygen and unable to get a seat belt around him.   [name] [phone]

## 2022-05-25 ENCOUNTER — Telehealth: Payer: Self-pay | Admitting: Family Medicine

## 2022-05-25 ENCOUNTER — Other Ambulatory Visit: Payer: Self-pay

## 2022-05-25 DIAGNOSIS — J9621 Acute and chronic respiratory failure with hypoxia: Secondary | ICD-10-CM | POA: Diagnosis not present

## 2022-05-25 DIAGNOSIS — R338 Other retention of urine: Secondary | ICD-10-CM | POA: Diagnosis not present

## 2022-05-25 DIAGNOSIS — I130 Hypertensive heart and chronic kidney disease with heart failure and stage 1 through stage 4 chronic kidney disease, or unspecified chronic kidney disease: Secondary | ICD-10-CM | POA: Diagnosis not present

## 2022-05-25 DIAGNOSIS — G4733 Obstructive sleep apnea (adult) (pediatric): Secondary | ICD-10-CM | POA: Diagnosis not present

## 2022-05-25 DIAGNOSIS — J449 Chronic obstructive pulmonary disease, unspecified: Secondary | ICD-10-CM | POA: Diagnosis not present

## 2022-05-25 DIAGNOSIS — Z87891 Personal history of nicotine dependence: Secondary | ICD-10-CM | POA: Diagnosis not present

## 2022-05-25 DIAGNOSIS — J9610 Chronic respiratory failure, unspecified whether with hypoxia or hypercapnia: Secondary | ICD-10-CM | POA: Diagnosis not present

## 2022-05-25 DIAGNOSIS — Z7951 Long term (current) use of inhaled steroids: Secondary | ICD-10-CM | POA: Diagnosis not present

## 2022-05-25 DIAGNOSIS — Z9981 Dependence on supplemental oxygen: Secondary | ICD-10-CM | POA: Diagnosis not present

## 2022-05-25 DIAGNOSIS — I5033 Acute on chronic diastolic (congestive) heart failure: Secondary | ICD-10-CM | POA: Diagnosis not present

## 2022-05-25 DIAGNOSIS — R1314 Dysphagia, pharyngoesophageal phase: Secondary | ICD-10-CM | POA: Diagnosis not present

## 2022-05-25 DIAGNOSIS — N1831 Chronic kidney disease, stage 3a: Secondary | ICD-10-CM | POA: Diagnosis not present

## 2022-05-25 DIAGNOSIS — I2510 Atherosclerotic heart disease of native coronary artery without angina pectoris: Secondary | ICD-10-CM | POA: Diagnosis not present

## 2022-05-25 DIAGNOSIS — F32A Depression, unspecified: Secondary | ICD-10-CM | POA: Diagnosis not present

## 2022-05-25 DIAGNOSIS — H9193 Unspecified hearing loss, bilateral: Secondary | ICD-10-CM | POA: Diagnosis not present

## 2022-05-25 DIAGNOSIS — E785 Hyperlipidemia, unspecified: Secondary | ICD-10-CM | POA: Diagnosis not present

## 2022-05-25 MED ORDER — TORSEMIDE 20 MG PO TABS: 20.0000 mg | ORAL_TABLET | Freq: Every day | ORAL | 3 refills | Status: DC

## 2022-05-25 NOTE — Telephone Encounter (Signed)
Refill on Vitamin D2 sent to [facility], [place]

## 2022-05-25 NOTE — Addendum Note (Signed)
Addended by: Margaretha Sheffield on: 05/25/2022 02:18 PM   Modules accepted: Orders

## 2022-05-28 ENCOUNTER — Encounter: Payer: Self-pay | Admitting: Family Medicine

## 2022-05-28 NOTE — Telephone Encounter (Signed)
A letter was dictated so in the circumstances where he is not able to utilize a seatbelt they would have a letter but it is my recommendation that he wear a seatbelt when riding in a car.  If for some reason he cannot use the shoulder restraint at the very least a seatbelt that goes across his waist.  They do make seatbelt extenders for morbidly obese individuals as well.  Please advise this to the family.  They may have their letter. I would recommend that he still utilize a seatbelt to go around his waist even if he cannot utilize the shoulder portion of the seatbelt.  Riding in a car without a seatbelt altogether is a high risk and could result in death.

## 2022-05-28 NOTE — Telephone Encounter (Signed)
Daughter [name](DPR) notified and verbalized understanding and stated she will get a seatbelt extender

## 2022-05-28 NOTE — Progress Notes (Signed)
Please provide to family number thank you

## 2022-05-28 NOTE — Telephone Encounter (Signed)
I did the letter for the seatbelt, I would not recommend an extra fluid pill unless swelling in the legs are getting worse

## 2022-05-28 NOTE — Telephone Encounter (Signed)
May have refills on the vitamin D 50,000 units 1/week #4 with 5 refills  When patient gets closer to his July visit he should request fresh labs

## 2022-05-30 ENCOUNTER — Ambulatory Visit: Payer: Self-pay | Admitting: *Deleted

## 2022-05-30 ENCOUNTER — Encounter: Payer: Self-pay | Admitting: *Deleted

## 2022-05-30 MED ORDER — VITAMIN D (ERGOCALCIFEROL) 1.25 MG (50000 UNIT) PO CAPS: 50000.0000 [IU] | ORAL_CAPSULE | ORAL | 0 refills | Status: DC

## 2022-05-30 NOTE — Telephone Encounter (Signed)
Daughter [name](DPR) notified of provider's recommendations and verbalized understanding.

## 2022-05-30 NOTE — Patient Instructions (Signed)
Visit Information  Thank you for taking time to visit with me today. Please don't hesitate to contact me if I can be of assistance to you.   Following are the goals we discussed today:   Goals Addressed             This Visit's Progress    Receive Assistance Obtaining In-Home Care Services.   On track    Care Coordination Interventions:  Interventions Today    Flowsheet Row Most Recent Value  Chronic Disease Discussed/Reviewed   Chronic disease discussed/reviewed during today's visit Chronic Kidney Disease/End Stage Renal Disease (ESRD)  General Interventions   General Interventions Discussed/Reviewed General Interventions Discussed, General Interventions Reviewed, Durable Medical Equipment (DME), Health Screening, Community Resources, Doctor Visits  Doctor Visits Discussed/Reviewed Doctor Visits Discussed, Doctor Visits Reviewed, Annual Wellness Visits, PCP  Durable Medical Equipment (DME) Bed side commode, BP Cuff, Environmental consultant, Wheelchair, Physicist, medical  PCP/Specialist Visits Compliance with follow-up visit  Exercise Interventions   Exercise Discussed/Reviewed Exercise Discussed, Exercise Reviewed, Weight Managment, Assistive device use and maintanence  Weight Management Weight loss  Education Interventions   Education Provided Provided Verbal Education  Provided Verbal Education On Nutrition, Mental Health/Coping with Illness, Exercise, Medication, When to see the doctor  Mental Health Interventions   Mental Health Discussed/Reviewed Mental Health Discussed, Mental Health Reviewed, Coping Strategies  Nutrition Interventions   Nutrition Discussed/Reviewed Nutrition Discussed, Nutrition Reviewed, Portion sizes, Decreasing sugar intake, Increaing proteins, Decreasing fats, Decreasing salt  Pharmacy Interventions   Pharmacy Dicussed/Reviewed Medication Adherence, Affording Medications  Safety Interventions   Safety Discussed/Reviewed Safety Discussed, Safety  Reviewed, Fall Risk, Home Safety  Home Safety Assistive Devices, Need for home safety assessment, Refer for community resources      Active Listening & Reflection Utilized.  Verbalization of Feelings Encouraged.  Emotional Support Provided. Caregiver Stress Acknowledged. Feelings of Caregiver Burnout & Fatigue Validated. Emphasized Importance of Self-Enrollment in Caregiver Support. Problem Solving Interventions Revised. Task-Centered Solutions Activated.  Solution-Focused Strategies Employed.  Acceptance & Commitment Therapy Indicated. Cognitive Behavioral Therapy Initiated. Client-Centered Therapy Performed. CSW Collaboration with Daughter, [name] to Provide Empathy & Emotional Support, While Discussing Frustrations Regarding Process for Trying to Obtain a 3-in-1/Bariatric Bedside Commode for Home Use, Covered Under [employer]. CSW Collaboration with Daughter, [name] to Confirm Letter of Medical Necessity for 3-in-1/Bariatric Bedside Commode, Completed by Primary Care Provider, Dr. Lilyan Punt with [facility] [phone]), on 05/19/2022, by Review of Electronic Medical Record in Epic. CSW Collaboration with Daughter, [name] to Confirm Letter of Medical Necessity Listing Co-morbidities Preventing Patient from Safe Seatbelt Wearing, Completed by Primary Care Provider, Dr. Lilyan Punt with [facility] [phone]# [phone]), on 05/28/2022, by Review of Electronic Medical Record in Epic. CSW Collaboration with Daughter, [name] to Offer to Print Both Letters of Medical Necessity & Fax to [employer] (# [phone]), But Representative with [employer] Indicated Originals of Both Letters Was Required. CSW Collaboration with Daughter, [name] to Confirm Intent to Pick Up Original Letters of Medical Necessity from Primary Care Provider, Dr. Lilyan Punt with [facility] [phone]# [phone]) & Hand Deliver to [employer] [phone]), within Next 48 Hours. CSW Collaboration with Ron Parker, Home Health Occupational Therapist with [facility] [phone]), to Confirm Continued Ability to Install 3-in-1/Bariatric Bedside Commode, Once Obtained, Even After Discharge of Services. CSW Collaboration with Daughter, [name] to Confirm Approval for An Additional 2 Weeks of Home  Health Physical Therapy Services, through [facility] [phone]).  CSW Collaboration with Daughter, [name] to [employer] with CSW [phone]), if She Has Questions, Needs Assistance, or If Additional Social Work Needs Are Identified Between Now & Our Next Scheduled Follow-Up Outreach Call.      Our next appointment is by telephone on 06/07/2022 at 10:30 am.  Please call the care guide team at [phone] if you need to cancel or reschedule your appointment.   If you are experiencing a Mental Health or Behavioral Health Crisis or need someone to talk to, please call the Suicide and Crisis Lifeline: 988 call the [place] National Suicide Prevention Lifeline: 580-196-5521 or TTY: 808-095-9216 TTY (361)620-7114) to talk to a trained counselor call 1-800-273-TALK (toll free, 24 hour hotline) go to [facility] [address], [place] [phone]) call the [facility] Crisis Line: 720-165-2817 call 911  Patient verbalizes understanding of instructions and care plan provided today and agrees to view in MyChart. Active MyChart status and patient understanding of how to access instructions and care plan via MyChart confirmed with patient.     Telephone follow up appointment with care management team member scheduled for:  06/07/2022 at 10:30 am.  Danford Bad, BSW, MSW, LCSW  Licensed Clinical Social Worker  [facility]  Mailing  [place]. [address] [place], [place] [address] Physical Address[address], [place], [place] [address] Toll Free Main # [phone] Fax # [phone] Cell # [phone] [email or web address]

## 2022-05-30 NOTE — Patient Outreach (Signed)
Care Coordination   Follow Up Visit Note   05/30/2022  Name: [name] MRN: [id number] DOB: 04/16/1940  [name] is a 82 y.o. year old male who sees Luking, [name], MD for primary care. I spoke with patient's daughter, [name] by phone today.  What matters to the patients health and wellness today?  Receive Assistance Obtaining In-Home Care Services.   Goals Addressed             This Visit's Progress    Receive Assistance Obtaining In-Home Care Services.   On track    Care Coordination Interventions:  Interventions Today    Flowsheet Row Most Recent Value  Chronic Disease Discussed/Reviewed   Chronic disease discussed/reviewed during today's visit Chronic Kidney Disease/End Stage Renal Disease (ESRD)  General Interventions   General Interventions Discussed/Reviewed General Interventions Discussed, General Interventions Reviewed, Durable Medical Equipment (DME), Health Screening, Community Resources, Doctor Visits  Doctor Visits Discussed/Reviewed Doctor Visits Discussed, Doctor Visits Reviewed, Annual Wellness Visits, PCP  Durable Medical Equipment (DME) Bed side commode, BP Cuff, Environmental consultant, Wheelchair, Physicist, medical  PCP/Specialist Visits Compliance with follow-up visit  Exercise Interventions   Exercise Discussed/Reviewed Exercise Discussed, Exercise Reviewed, Weight Managment, Assistive device use and maintanence  Weight Management Weight loss  Education Interventions   Education Provided Provided Verbal Education  Provided Verbal Education On Nutrition, Mental Health/Coping with Illness, Exercise, Medication, When to see the doctor  Mental Health Interventions   Mental Health Discussed/Reviewed Mental Health Discussed, Mental Health Reviewed, Coping Strategies  Nutrition Interventions   Nutrition Discussed/Reviewed Nutrition Discussed, Nutrition Reviewed, Portion sizes, Decreasing sugar intake, Increaing proteins, Decreasing  fats, Decreasing salt  Pharmacy Interventions   Pharmacy Dicussed/Reviewed Medication Adherence, Affording Medications  Safety Interventions   Safety Discussed/Reviewed Safety Discussed, Safety Reviewed, Fall Risk, Home Safety  Home Safety Assistive Devices, Need for home safety assessment, Refer for community resources      Active Listening & Reflection Utilized.  Verbalization of Feelings Encouraged.  Emotional Support Provided. Caregiver Stress Acknowledged. Feelings of Caregiver Burnout & Fatigue Validated. Emphasized Importance of Self-Enrollment in Caregiver Support. Problem Solving Interventions Revised. Task-Centered Solutions Activated.  Solution-Focused Strategies Employed.  Acceptance & Commitment Therapy Indicated. Cognitive Behavioral Therapy Initiated. Client-Centered Therapy Performed. CSW Collaboration with Daughter, [name] to Provide Empathy & Emotional Support, While Discussing Frustrations Regarding Process for Trying to Obtain a 3-in-1/Bariatric Bedside Commode for Home Use, Covered Under [employer]. CSW Collaboration with Daughter, [name] to Confirm Letter of Medical Necessity for 3-in-1/Bariatric Bedside Commode, Completed by Primary Care Provider, Dr. Lilyan Punt with [facility] [phone]), on 05/19/2022, by Review of Electronic Medical Record in Epic. CSW Collaboration with Daughter, [name] to Confirm Letter of Medical Necessity Listing Co-morbidities Preventing Patient from Safe Seatbelt Wearing, Completed by Primary Care Provider, Dr. Lilyan Punt with [facility] [phone]# [phone]), on 05/28/2022, by Review of Electronic Medical Record in Epic. CSW Collaboration with Daughter, [name] to Offer to Print Both Letters of Medical Necessity & Fax to [employer] (# [phone]), But Representative with [employer] Indicated Originals of Both Letters Was  Required. CSW Collaboration with Daughter, [name] to Confirm Intent to Pick Up Original Letters of Medical Necessity from Primary Care Provider, Dr. Lilyan Punt with [facility] [phone]# [phone]) & Hand Deliver to [employer] [phone]), within Next 48 Hours. CSW Collaboration with Ron Parker, Home Health Occupational Therapist with [facility] [phone]), to  Confirm Continued Ability to Install 3-in-1/Bariatric Bedside Commode, Once Obtained, Even After Discharge of Services. CSW Collaboration with Daughter, [name] to Confirm Approval for An Additional 2 Weeks of Home Health Physical Therapy Services, through [facility] [phone]).  CSW Collaboration with Daughter, [name] to [employer] with CSW [phone]), if She Has Questions, Needs Assistance, or If Additional Social Work Needs Are Identified Between Now & Our Next Scheduled Follow-Up Outreach Call.      SDOH assessments and interventions completed:  Yes.  Care Coordination Interventions:  Yes, provided.   Follow up plan: Follow up call scheduled for 06/07/2022 at 10:30 am.  Encounter Outcome:  Pt. Visit Completed.   Danford Bad, BSW, MSW, LCSW  Licensed Restaurant manager, [employer] [facility]  Mailing [place] [address], [place], [place] [address] Physical Address-[address], [place], [place] [address] Toll Free Main # [phone] Fax # [phone] Cell # [phone] [email or web address]

## 2022-05-30 NOTE — Telephone Encounter (Signed)
Received via fax Rx request: Prescription sent electronically to pharmacy  

## 2022-05-31 DIAGNOSIS — Z9181 History of falling: Secondary | ICD-10-CM | POA: Diagnosis not present

## 2022-06-03 ENCOUNTER — Other Ambulatory Visit: Payer: Self-pay | Admitting: Family Medicine

## 2022-06-04 DIAGNOSIS — I2510 Atherosclerotic heart disease of native coronary artery without angina pectoris: Secondary | ICD-10-CM | POA: Diagnosis not present

## 2022-06-04 DIAGNOSIS — Z9981 Dependence on supplemental oxygen: Secondary | ICD-10-CM | POA: Diagnosis not present

## 2022-06-04 DIAGNOSIS — J449 Chronic obstructive pulmonary disease, unspecified: Secondary | ICD-10-CM | POA: Diagnosis not present

## 2022-06-04 DIAGNOSIS — J9621 Acute and chronic respiratory failure with hypoxia: Secondary | ICD-10-CM | POA: Diagnosis not present

## 2022-06-04 DIAGNOSIS — H9193 Unspecified hearing loss, bilateral: Secondary | ICD-10-CM | POA: Diagnosis not present

## 2022-06-04 DIAGNOSIS — E785 Hyperlipidemia, unspecified: Secondary | ICD-10-CM | POA: Diagnosis not present

## 2022-06-04 DIAGNOSIS — I5033 Acute on chronic diastolic (congestive) heart failure: Secondary | ICD-10-CM | POA: Diagnosis not present

## 2022-06-04 DIAGNOSIS — G4733 Obstructive sleep apnea (adult) (pediatric): Secondary | ICD-10-CM | POA: Diagnosis not present

## 2022-06-04 DIAGNOSIS — N1831 Chronic kidney disease, stage 3a: Secondary | ICD-10-CM | POA: Diagnosis not present

## 2022-06-04 DIAGNOSIS — F32A Depression, unspecified: Secondary | ICD-10-CM | POA: Diagnosis not present

## 2022-06-04 DIAGNOSIS — I130 Hypertensive heart and chronic kidney disease with heart failure and stage 1 through stage 4 chronic kidney disease, or unspecified chronic kidney disease: Secondary | ICD-10-CM | POA: Diagnosis not present

## 2022-06-04 DIAGNOSIS — Z7951 Long term (current) use of inhaled steroids: Secondary | ICD-10-CM | POA: Diagnosis not present

## 2022-06-04 DIAGNOSIS — R1314 Dysphagia, pharyngoesophageal phase: Secondary | ICD-10-CM | POA: Diagnosis not present

## 2022-06-04 DIAGNOSIS — J9610 Chronic respiratory failure, unspecified whether with hypoxia or hypercapnia: Secondary | ICD-10-CM | POA: Diagnosis not present

## 2022-06-04 DIAGNOSIS — R338 Other retention of urine: Secondary | ICD-10-CM | POA: Diagnosis not present

## 2022-06-04 DIAGNOSIS — Z87891 Personal history of nicotine dependence: Secondary | ICD-10-CM | POA: Diagnosis not present

## 2022-06-06 DIAGNOSIS — Z7951 Long term (current) use of inhaled steroids: Secondary | ICD-10-CM | POA: Diagnosis not present

## 2022-06-06 DIAGNOSIS — J449 Chronic obstructive pulmonary disease, unspecified: Secondary | ICD-10-CM | POA: Diagnosis not present

## 2022-06-06 DIAGNOSIS — I130 Hypertensive heart and chronic kidney disease with heart failure and stage 1 through stage 4 chronic kidney disease, or unspecified chronic kidney disease: Secondary | ICD-10-CM | POA: Diagnosis not present

## 2022-06-06 DIAGNOSIS — H9193 Unspecified hearing loss, bilateral: Secondary | ICD-10-CM | POA: Diagnosis not present

## 2022-06-06 DIAGNOSIS — E785 Hyperlipidemia, unspecified: Secondary | ICD-10-CM | POA: Diagnosis not present

## 2022-06-06 DIAGNOSIS — J9621 Acute and chronic respiratory failure with hypoxia: Secondary | ICD-10-CM | POA: Diagnosis not present

## 2022-06-06 DIAGNOSIS — Z87891 Personal history of nicotine dependence: Secondary | ICD-10-CM | POA: Diagnosis not present

## 2022-06-06 DIAGNOSIS — I2510 Atherosclerotic heart disease of native coronary artery without angina pectoris: Secondary | ICD-10-CM | POA: Diagnosis not present

## 2022-06-06 DIAGNOSIS — J9610 Chronic respiratory failure, unspecified whether with hypoxia or hypercapnia: Secondary | ICD-10-CM | POA: Diagnosis not present

## 2022-06-06 DIAGNOSIS — F32A Depression, unspecified: Secondary | ICD-10-CM | POA: Diagnosis not present

## 2022-06-06 DIAGNOSIS — N1831 Chronic kidney disease, stage 3a: Secondary | ICD-10-CM | POA: Diagnosis not present

## 2022-06-06 DIAGNOSIS — G4733 Obstructive sleep apnea (adult) (pediatric): Secondary | ICD-10-CM | POA: Diagnosis not present

## 2022-06-06 DIAGNOSIS — R1314 Dysphagia, pharyngoesophageal phase: Secondary | ICD-10-CM | POA: Diagnosis not present

## 2022-06-06 DIAGNOSIS — R338 Other retention of urine: Secondary | ICD-10-CM | POA: Diagnosis not present

## 2022-06-06 DIAGNOSIS — Z9981 Dependence on supplemental oxygen: Secondary | ICD-10-CM | POA: Diagnosis not present

## 2022-06-06 DIAGNOSIS — I5033 Acute on chronic diastolic (congestive) heart failure: Secondary | ICD-10-CM | POA: Diagnosis not present

## 2022-06-07 ENCOUNTER — Other Ambulatory Visit (HOSPITAL_BASED_OUTPATIENT_CLINIC_OR_DEPARTMENT_OTHER): Payer: Self-pay

## 2022-06-07 ENCOUNTER — Encounter: Payer: Self-pay | Admitting: *Deleted

## 2022-06-07 ENCOUNTER — Ambulatory Visit: Payer: Self-pay | Admitting: *Deleted

## 2022-06-07 NOTE — Patient Instructions (Signed)
Visit Information  Thank you for taking time to visit with me today. Please don't hesitate to contact me if I can be of assistance to you.   Following are the goals we discussed today:   Goals Addressed             This Visit's Progress    Receive Assistance Obtaining In-Home Care Services.   On track    Care Coordination Interventions:  Interventions Today    Flowsheet Row Most Recent Value  Chronic Disease Discussed/Reviewed   Chronic disease discussed/reviewed during today's visit Chronic Kidney Disease/End Stage Renal Disease (ESRD)  General Interventions   General Interventions Discussed/Reviewed General Interventions Discussed, General Interventions Reviewed, Durable Medical Equipment (DME), Health Screening, Community Resources, Doctor Visits  Doctor Visits Discussed/Reviewed Doctor Visits Discussed, Doctor Visits Reviewed, Annual Wellness Visits, PCP  Durable Medical Equipment (DME) Bed side commode, BP Cuff, Environmental consultant, Wheelchair, Physicist, medical  PCP/Specialist Visits Compliance with follow-up visit  Exercise Interventions   Exercise Discussed/Reviewed Exercise Discussed, Exercise Reviewed, Weight Managment, Assistive device use and maintanence  Weight Management Weight loss  Education Interventions   Education Provided Provided Verbal Education  Provided Verbal Education On Nutrition, Mental Health/Coping with Illness, Exercise, Medication, When to see the doctor  Mental Health Interventions   Mental Health Discussed/Reviewed Mental Health Discussed, Mental Health Reviewed, Coping Strategies  Nutrition Interventions   Nutrition Discussed/Reviewed Nutrition Discussed, Nutrition Reviewed, Portion sizes, Decreasing sugar intake, Increaing proteins, Decreasing fats, Decreasing salt  Pharmacy Interventions   Pharmacy Dicussed/Reviewed Medication Adherence, Affording Medications  Safety Interventions   Safety Discussed/Reviewed Safety Discussed, Safety  Reviewed, Fall Risk, Home Safety  Home Safety Assistive Devices, Need for home safety assessment, Refer for community resources      Active Listening & Reflection Utilized.  Verbalization of Feelings Encouraged.  Emotional Support Provided. Caregiver Stress Acknowledged. Feelings of Caregiver Burnout & Fatigue Validated. Emphasized Importance of Self-Enrollment in Caregiver Support. Problem Solving Interventions Revised. Task-Centered Solutions Activated.  Solution-Focused Strategies Employed.  Acceptance & Commitment Therapy Indicated. Cognitive Behavioral Therapy Initiated. Client-Centered Therapy Performed. CSW Collaboration with Daughter, [name] to Confirm Receipt of 3-in-1/Bariatric Bedside Commode for Patient's Home Use, through [employer] [phone]). CSW Collaboration with Daughter, [name] to Confirm 3-in-1/Bariatric Bedside Commode, through [employer] [phone]), Covered Under [employer], [employer], with No Out-of-Pocket Expense. CSW Collaboration with Daughter, [name] to [facility] of 3-in-1/Bariatric Bedside Commode by Ron Parker, Home Health Occupational Therapist with [facility] [phone]). CSW Collaboration with Daughter, [name] to [facility] Continued Home Health Physical Therapy Services, through [facility] [phone]).  CSW Collaboration with Daughter, [name] to [employer] with CSW [phone]), if She Has Questions, Needs Assistance, or If Additional Social Work Needs Are Identified Between Now & Our Next Scheduled Follow-Up Outreach Call.      Our next appointment is by telephone on 06/20/2022 at 11:30 am.  Please call the care guide team at [phone] if you need to cancel or reschedule your appointment.   If you are experiencing a Mental Health or Behavioral Health Crisis or need someone to talk to, please call the Suicide  and Crisis Lifeline: 988 call the [place] National Suicide Prevention Lifeline: 225-818-0087 or TTY: 6290600427 TTY 251-300-3791) to talk to a trained counselor call 1-800-273-TALK (toll free, 24 hour hotline) go to [facility] [address], [place] [phone]) call the [facility] Line: ([phone] call 911  Patient  verbalizes understanding of instructions and care plan provided today and agrees to view in MyChart. Active MyChart status and patient understanding of how to access instructions and care plan via MyChart confirmed with patient.     Telephone follow up appointment with care management team member scheduled for:  06/20/2022 at 11:30 am.  Danford Bad, BSW, MSW, LCSW  Licensed Clinical Social Worker  [facility]  Mailing [place]. [address] [place], [place] [address] Physical Address[address], [place], [place] [address] Toll Free Main # [phone] Fax # [phone] Cell # [phone] [email or web address]

## 2022-06-07 NOTE — Patient Outreach (Signed)
Care Coordination   Follow Up Visit Note   06/07/2022  Name: [name] MRN: [id number] DOB: 1940-06-05  [name] is a 82 y.o. year old male who sees Luking, [name], MD for primary care. I spoke with patient's daughter, [name] by phone today.  What matters to the patients health and wellness today?  Receive Assistance Obtaining In-Home Care Services.   Goals Addressed             This Visit's Progress    Receive Assistance Obtaining In-Home Care Services.   On track    Care Coordination Interventions:  Interventions Today    Flowsheet Row Most Recent Value  Chronic Disease Discussed/Reviewed   Chronic disease discussed/reviewed during today's visit Chronic Kidney Disease/End Stage Renal Disease (ESRD)  General Interventions   General Interventions Discussed/Reviewed General Interventions Discussed, General Interventions Reviewed, Durable Medical Equipment (DME), Health Screening, Community Resources, Doctor Visits  Doctor Visits Discussed/Reviewed Doctor Visits Discussed, Doctor Visits Reviewed, Annual Wellness Visits, PCP  Durable Medical Equipment (DME) Bed side commode, BP Cuff, Environmental consultant, Wheelchair, Physicist, medical  PCP/Specialist Visits Compliance with follow-up visit  Exercise Interventions   Exercise Discussed/Reviewed Exercise Discussed, Exercise Reviewed, Weight Managment, Assistive device use and maintanence  Weight Management Weight loss  Education Interventions   Education Provided Provided Verbal Education  Provided Verbal Education On Nutrition, Mental Health/Coping with Illness, Exercise, Medication, When to see the doctor  Mental Health Interventions   Mental Health Discussed/Reviewed Mental Health Discussed, Mental Health Reviewed, Coping Strategies  Nutrition Interventions   Nutrition Discussed/Reviewed Nutrition Discussed, Nutrition Reviewed, Portion sizes, Decreasing sugar intake, Increaing proteins, Decreasing  fats, Decreasing salt  Pharmacy Interventions   Pharmacy Dicussed/Reviewed Medication Adherence, Affording Medications  Safety Interventions   Safety Discussed/Reviewed Safety Discussed, Safety Reviewed, Fall Risk, Home Safety  Home Safety Assistive Devices, Need for home safety assessment, Refer for community resources      Active Listening & Reflection Utilized.  Verbalization of Feelings Encouraged.  Emotional Support Provided. Caregiver Stress Acknowledged. Feelings of Caregiver Burnout & Fatigue Validated. Emphasized Importance of Self-Enrollment in Caregiver Support. Problem Solving Interventions Revised. Task-Centered Solutions Activated.  Solution-Focused Strategies Employed.  Acceptance & Commitment Therapy Indicated. Cognitive Behavioral Therapy Initiated. Client-Centered Therapy Performed. CSW Collaboration with Daughter, [name] to Confirm Receipt of 3-in-1/Bariatric Bedside Commode for Patient's Home Use, through [employer] [phone]). CSW Collaboration with Daughter, [name] to Confirm 3-in-1/Bariatric Bedside Commode, through [employer] [phone]), Covered Under [employer], [employer], with No Out-of-Pocket Expense. CSW Collaboration with Daughter, [name] to [facility] of 3-in-1/Bariatric Bedside Commode by Ron Parker, Home Health Occupational Therapist with [facility] [phone]). CSW Collaboration with Daughter, [name] to [facility] Continued Home Health Physical Therapy Services, through [facility] [phone]).  CSW Collaboration with Daughter, [name] to [employer] with CSW [phone]), if She Has Questions, Needs Assistance, or If Additional Social Work Needs Are Identified Between Now & Our Next Scheduled Follow-Up Outreach Call.      SDOH assessments and interventions completed:  Yes.  Care Coordination Interventions:  Yes,  provided.   Follow up plan: Follow up call scheduled for 06/20/2022 at 11:30 am.   Encounter Outcome:  Pt. Visit Completed.   Danford Bad, BSW, MSW, LCSW  Licensed Restaurant manager, fast food [facility]  Mailing [place] [address] [place], [place] [address] Physical Address[address] [place], [place], [place] [address] Toll  Free Main # [phone] Fax # [phone] Cell # [phone] [email or web address]

## 2022-06-10 ENCOUNTER — Encounter: Payer: Self-pay | Admitting: Family Medicine

## 2022-06-10 NOTE — Progress Notes (Signed)
Please mail this letter along with the dermatology information packet/previous records to the patient thank you  This dermatology information previous records packet will be forwarded to the front office

## 2022-06-14 DIAGNOSIS — J449 Chronic obstructive pulmonary disease, unspecified: Secondary | ICD-10-CM | POA: Diagnosis not present

## 2022-06-15 DIAGNOSIS — Z7951 Long term (current) use of inhaled steroids: Secondary | ICD-10-CM | POA: Diagnosis not present

## 2022-06-15 DIAGNOSIS — I2510 Atherosclerotic heart disease of native coronary artery without angina pectoris: Secondary | ICD-10-CM | POA: Diagnosis not present

## 2022-06-15 DIAGNOSIS — J9610 Chronic respiratory failure, unspecified whether with hypoxia or hypercapnia: Secondary | ICD-10-CM | POA: Diagnosis not present

## 2022-06-15 DIAGNOSIS — J449 Chronic obstructive pulmonary disease, unspecified: Secondary | ICD-10-CM | POA: Diagnosis not present

## 2022-06-15 DIAGNOSIS — Z87891 Personal history of nicotine dependence: Secondary | ICD-10-CM | POA: Diagnosis not present

## 2022-06-15 DIAGNOSIS — F32A Depression, unspecified: Secondary | ICD-10-CM | POA: Diagnosis not present

## 2022-06-15 DIAGNOSIS — I5033 Acute on chronic diastolic (congestive) heart failure: Secondary | ICD-10-CM | POA: Diagnosis not present

## 2022-06-15 DIAGNOSIS — Z9981 Dependence on supplemental oxygen: Secondary | ICD-10-CM | POA: Diagnosis not present

## 2022-06-15 DIAGNOSIS — I130 Hypertensive heart and chronic kidney disease with heart failure and stage 1 through stage 4 chronic kidney disease, or unspecified chronic kidney disease: Secondary | ICD-10-CM | POA: Diagnosis not present

## 2022-06-15 DIAGNOSIS — R338 Other retention of urine: Secondary | ICD-10-CM | POA: Diagnosis not present

## 2022-06-15 DIAGNOSIS — H9193 Unspecified hearing loss, bilateral: Secondary | ICD-10-CM | POA: Diagnosis not present

## 2022-06-15 DIAGNOSIS — E785 Hyperlipidemia, unspecified: Secondary | ICD-10-CM | POA: Diagnosis not present

## 2022-06-15 DIAGNOSIS — N1831 Chronic kidney disease, stage 3a: Secondary | ICD-10-CM | POA: Diagnosis not present

## 2022-06-15 DIAGNOSIS — G4733 Obstructive sleep apnea (adult) (pediatric): Secondary | ICD-10-CM | POA: Diagnosis not present

## 2022-06-15 DIAGNOSIS — R1314 Dysphagia, pharyngoesophageal phase: Secondary | ICD-10-CM | POA: Diagnosis not present

## 2022-06-15 DIAGNOSIS — J9621 Acute and chronic respiratory failure with hypoxia: Secondary | ICD-10-CM | POA: Diagnosis not present

## 2022-06-17 DIAGNOSIS — I2510 Atherosclerotic heart disease of native coronary artery without angina pectoris: Secondary | ICD-10-CM | POA: Diagnosis not present

## 2022-06-17 DIAGNOSIS — I509 Heart failure, unspecified: Secondary | ICD-10-CM | POA: Diagnosis not present

## 2022-06-17 DIAGNOSIS — M6281 Muscle weakness (generalized): Secondary | ICD-10-CM | POA: Diagnosis not present

## 2022-06-19 DIAGNOSIS — E785 Hyperlipidemia, unspecified: Secondary | ICD-10-CM | POA: Diagnosis not present

## 2022-06-19 DIAGNOSIS — F32A Depression, unspecified: Secondary | ICD-10-CM | POA: Diagnosis not present

## 2022-06-19 DIAGNOSIS — J9621 Acute and chronic respiratory failure with hypoxia: Secondary | ICD-10-CM | POA: Diagnosis not present

## 2022-06-19 DIAGNOSIS — R1314 Dysphagia, pharyngoesophageal phase: Secondary | ICD-10-CM | POA: Diagnosis not present

## 2022-06-19 DIAGNOSIS — I2510 Atherosclerotic heart disease of native coronary artery without angina pectoris: Secondary | ICD-10-CM | POA: Diagnosis not present

## 2022-06-19 DIAGNOSIS — H9193 Unspecified hearing loss, bilateral: Secondary | ICD-10-CM | POA: Diagnosis not present

## 2022-06-19 DIAGNOSIS — J449 Chronic obstructive pulmonary disease, unspecified: Secondary | ICD-10-CM | POA: Diagnosis not present

## 2022-06-19 DIAGNOSIS — R338 Other retention of urine: Secondary | ICD-10-CM | POA: Diagnosis not present

## 2022-06-19 DIAGNOSIS — Z87891 Personal history of nicotine dependence: Secondary | ICD-10-CM | POA: Diagnosis not present

## 2022-06-19 DIAGNOSIS — Z7951 Long term (current) use of inhaled steroids: Secondary | ICD-10-CM | POA: Diagnosis not present

## 2022-06-19 DIAGNOSIS — I130 Hypertensive heart and chronic kidney disease with heart failure and stage 1 through stage 4 chronic kidney disease, or unspecified chronic kidney disease: Secondary | ICD-10-CM | POA: Diagnosis not present

## 2022-06-19 DIAGNOSIS — Z9981 Dependence on supplemental oxygen: Secondary | ICD-10-CM | POA: Diagnosis not present

## 2022-06-19 DIAGNOSIS — J9610 Chronic respiratory failure, unspecified whether with hypoxia or hypercapnia: Secondary | ICD-10-CM | POA: Diagnosis not present

## 2022-06-19 DIAGNOSIS — N1831 Chronic kidney disease, stage 3a: Secondary | ICD-10-CM | POA: Diagnosis not present

## 2022-06-19 DIAGNOSIS — I5033 Acute on chronic diastolic (congestive) heart failure: Secondary | ICD-10-CM | POA: Diagnosis not present

## 2022-06-19 DIAGNOSIS — G4733 Obstructive sleep apnea (adult) (pediatric): Secondary | ICD-10-CM | POA: Diagnosis not present

## 2022-06-20 ENCOUNTER — Ambulatory Visit: Payer: Self-pay | Admitting: *Deleted

## 2022-06-20 ENCOUNTER — Encounter: Payer: Self-pay | Admitting: *Deleted

## 2022-06-20 DIAGNOSIS — J9601 Acute respiratory failure with hypoxia: Secondary | ICD-10-CM | POA: Diagnosis not present

## 2022-06-20 DIAGNOSIS — J441 Chronic obstructive pulmonary disease with (acute) exacerbation: Secondary | ICD-10-CM | POA: Diagnosis not present

## 2022-06-20 DIAGNOSIS — I2510 Atherosclerotic heart disease of native coronary artery without angina pectoris: Secondary | ICD-10-CM | POA: Diagnosis not present

## 2022-06-20 DIAGNOSIS — N1831 Chronic kidney disease, stage 3a: Secondary | ICD-10-CM | POA: Diagnosis not present

## 2022-06-20 DIAGNOSIS — I5030 Unspecified diastolic (congestive) heart failure: Secondary | ICD-10-CM | POA: Diagnosis not present

## 2022-06-20 DIAGNOSIS — R0902 Hypoxemia: Secondary | ICD-10-CM | POA: Diagnosis not present

## 2022-06-20 DIAGNOSIS — K219 Gastro-esophageal reflux disease without esophagitis: Secondary | ICD-10-CM | POA: Diagnosis not present

## 2022-06-20 DIAGNOSIS — E210 Primary hyperparathyroidism: Secondary | ICD-10-CM | POA: Diagnosis not present

## 2022-06-20 DIAGNOSIS — I129 Hypertensive chronic kidney disease with stage 1 through stage 4 chronic kidney disease, or unspecified chronic kidney disease: Secondary | ICD-10-CM | POA: Diagnosis not present

## 2022-06-20 DIAGNOSIS — E785 Hyperlipidemia, unspecified: Secondary | ICD-10-CM | POA: Diagnosis not present

## 2022-06-20 NOTE — Patient Instructions (Signed)
Visit Information  Thank you for taking time to visit with me today. Please don't hesitate to contact me if I can be of assistance to you.   Following are the goals we discussed today:   Goals Addressed             This Visit's Progress    COMPLETED: Receive Assistance Obtaining In-Home Care Services.   On track    Care Coordination Interventions:  Interventions Today    Flowsheet Row Most Recent Value  Chronic Disease Discussed/Reviewed   Chronic disease discussed/reviewed during today's visit Chronic Kidney Disease/End Stage Renal Disease (ESRD)  General Interventions   General Interventions Discussed/Reviewed General Interventions Discussed, General Interventions Reviewed, Durable Medical Equipment (DME), Health Screening, Community Resources, Doctor Visits  Doctor Visits Discussed/Reviewed Doctor Visits Discussed, Doctor Visits Reviewed, Annual Wellness Visits, PCP  Durable Medical Equipment (DME) Bed side commode, BP Cuff, Environmental consultant, Wheelchair, Physicist, medical  PCP/Specialist Visits Compliance with follow-up visit  Exercise Interventions   Exercise Discussed/Reviewed Exercise Discussed, Exercise Reviewed, Weight Managment, Assistive device use and maintanence  Weight Management Weight loss  Education Interventions   Education Provided Provided Verbal Education  Provided Verbal Education On Nutrition, Mental Health/Coping with Illness, Exercise, Medication, When to see the doctor  Mental Health Interventions   Mental Health Discussed/Reviewed Mental Health Discussed, Mental Health Reviewed, Coping Strategies  Nutrition Interventions   Nutrition Discussed/Reviewed Nutrition Discussed, Nutrition Reviewed, Portion sizes, Decreasing sugar intake, Increaing proteins, Decreasing fats, Decreasing salt  Pharmacy Interventions   Pharmacy Dicussed/Reviewed Medication Adherence, Affording Medications  Safety Interventions   Safety Discussed/Reviewed Safety Discussed,  Safety Reviewed, Fall Risk, Home Safety  Home Safety Assistive Devices, Need for home safety assessment, Refer for community resources      Active Listening & Reflection Utilized.  Verbalization of Feelings Encouraged.  Emotional Support Provided. Problem Solving Interventions Implemented. Task-Centered Solutions Activated.  Solution-Focused Strategies Employed.  Acceptance & Commitment Therapy Indicated. Cognitive Behavioral Therapy Initiated. Client-Centered Therapy Performed. CSW Collaboration with Daughter, [name] to [employer] with CSW [phone]), if She Has Questions, Needs Assistance, or If Additional Social Work Needs Are Identified in The Near Future.      Please call the care guide team at [phone] if you need to cancel or reschedule your appointment.   If you are experiencing a Mental Health or Behavioral Health Crisis or need someone to talk to, please call the Suicide and Crisis Lifeline: 988 call the [place] National Suicide Prevention Lifeline: 725-203-6000 or TTY: (769)713-3057 TTY 470 143 3712) to talk to a trained counselor call 1-800-273-TALK (toll free, 24 hour hotline) go to [facility] [address] [place] [phone]) call the [facility] Crisis Line: 774-728-0454 call 911  Patient verbalizes understanding of instructions and care plan provided today and agrees to view in MyChart. Active MyChart status and patient understanding of how to access instructions and care plan via MyChart confirmed with patient.     No further follow up required.  Danford Bad, BSW, MSW, LCSW  Licensed Restaurant manager, fast food [facility]  Mailing [place] [address], [place], [place] [address] Physical Address[address] [place], [place] [address] Toll Free Main # [phone] Fax # [phone] Cell #  [phone] [email or web address]

## 2022-06-20 NOTE — Patient Outreach (Signed)
  Care Coordination   Follow Up Visit Note   06/20/2022  Name: [name] MRN: [id number] DOB: 24-Nov-1940  [name] is a 82 y.o. year old male who sees Luking, [name], MD for primary care. I spoke with patient's daughter, [name] by phone today.  What matters to the patients health and wellness today? Receive Assistance Obtaining In-Home Care Services.   Goals Addressed             This Visit's Progress    COMPLETED: Receive Assistance Obtaining In-Home Care Services.   On track    Care Coordination Interventions:  Interventions Today    Flowsheet Row Most Recent Value  Chronic Disease Discussed/Reviewed   Chronic disease discussed/reviewed during today's visit Chronic Kidney Disease/End Stage Renal Disease (ESRD)  General Interventions   General Interventions Discussed/Reviewed General Interventions Discussed, General Interventions Reviewed, Durable Medical Equipment (DME), Health Screening, Community Resources, Doctor Visits  Doctor Visits Discussed/Reviewed Doctor Visits Discussed, Doctor Visits Reviewed, Annual Wellness Visits, PCP  Durable Medical Equipment (DME) Bed side commode, BP Cuff, Environmental consultant, Wheelchair, Physicist, medical  PCP/Specialist Visits Compliance with follow-up visit  Exercise Interventions   Exercise Discussed/Reviewed Exercise Discussed, Exercise Reviewed, Weight Managment, Assistive device use and maintanence  Weight Management Weight loss  Education Interventions   Education Provided Provided Verbal Education  Provided Verbal Education On Nutrition, Mental Health/Coping with Illness, Exercise, Medication, When to see the doctor  Mental Health Interventions   Mental Health Discussed/Reviewed Mental Health Discussed, Mental Health Reviewed, Coping Strategies  Nutrition Interventions   Nutrition Discussed/Reviewed Nutrition Discussed, Nutrition Reviewed, Portion sizes, Decreasing sugar intake, Increaing proteins,  Decreasing fats, Decreasing salt  Pharmacy Interventions   Pharmacy Dicussed/Reviewed Medication Adherence, Affording Medications  Safety Interventions   Safety Discussed/Reviewed Safety Discussed, Safety Reviewed, Fall Risk, Home Safety  Home Safety Assistive Devices, Need for home safety assessment, Refer for community resources      Active Listening & Reflection Utilized.  Verbalization of Feelings Encouraged.  Emotional Support Provided. Problem Solving Interventions Implemented. Task-Centered Solutions Activated.  Solution-Focused Strategies Employed.  Acceptance & Commitment Therapy Indicated. Cognitive Behavioral Therapy Initiated. Client-Centered Therapy Performed. CSW Collaboration with Daughter, [name] to [employer] with CSW [phone]), if She Has Questions, Needs Assistance, or If Additional Social Work Needs Are Identified in The Near Future.      SDOH assessments and interventions completed:  Yes.  Care Coordination Interventions:  Yes, provided.   Follow up plan: No further intervention required.   Encounter Outcome:  Pt. Visit Completed.   Danford Bad, BSW, MSW, LCSW  Licensed Restaurant manager, fast food [facility]  Mailing [place] [address], [place], [place] [address] Physical Address[address] [place], [place] [address] Toll Free Main # [phone] Fax # [phone] Cell # [phone] [email or web address]

## 2022-06-21 DIAGNOSIS — G4733 Obstructive sleep apnea (adult) (pediatric): Secondary | ICD-10-CM | POA: Diagnosis not present

## 2022-06-26 LAB — LAB REPORT - SCANNED
Albumin, Urine POC: 106.9
Albumin/Creatinine Ratio, Urine, POC: 1068
EGFR (Non-African Amer.): 47

## 2022-06-27 ENCOUNTER — Telehealth: Payer: Self-pay

## 2022-06-27 NOTE — Telephone Encounter (Signed)
Nurses-it is very difficult to make heads or tails regarding blood pressure readings taken by third-party entities.(In other words I have no idea how accurate these are-what was their technique) I do not recommend adjusting any blood pressure medicine currently recommend a standard follow-up visit if he would like to move it up into June and we can move it into June

## 2022-06-27 NOTE — Telephone Encounter (Signed)
[facility] is calling discharged yesterday at max of for PT difficult time with him his BP elevated due to his scheduled   BP reading 166/69 Before they left 156/66  No signs of distress weighed 310 yesterday   [facility] -[phone]

## 2022-06-28 ENCOUNTER — Ambulatory Visit: Payer: Medicare Other | Admitting: Podiatry

## 2022-06-29 ENCOUNTER — Encounter: Payer: Self-pay | Admitting: *Deleted

## 2022-06-29 NOTE — Telephone Encounter (Signed)
Notified [facility] of provider's recommendations Notified patient via my chart

## 2022-07-04 ENCOUNTER — Other Ambulatory Visit: Payer: Self-pay

## 2022-07-04 ENCOUNTER — Telehealth: Payer: Self-pay

## 2022-07-04 DIAGNOSIS — N1831 Chronic kidney disease, stage 3a: Secondary | ICD-10-CM

## 2022-07-04 DIAGNOSIS — J439 Emphysema, unspecified: Secondary | ICD-10-CM

## 2022-07-04 DIAGNOSIS — I5032 Chronic diastolic (congestive) heart failure: Secondary | ICD-10-CM

## 2022-07-04 DIAGNOSIS — J9611 Chronic respiratory failure with hypoxia: Secondary | ICD-10-CM

## 2022-07-04 NOTE — Telephone Encounter (Signed)
Prescription Request  07/04/2022  LOV: Visit date not found  What is the name of the medication or equipment? metoprolol (LOPRESSOR) 50 MG tablet   Have you contacted your pharmacy to request a refill? Yes   Which pharmacy would you like this sent to?  [id number] - [place], [place] - [address] [place] [address] Phone: [phone] Fax: [phone]    Patient notified that their request is being sent to the clinical staff for review and that they should receive a response within 2 business days.   Please advise at Mobile [phone] (mobile)

## 2022-07-04 NOTE — Telephone Encounter (Signed)
The patient is on Carvidilol all he should not be on metoprolol I would recommend discontinuing this medicine-discontinue metoprolol Also recommended calling the pharmacy and tell them to discontinue this medicine so they do not keep sending it

## 2022-07-04 NOTE — Telephone Encounter (Signed)
Spoke with [name] and she is stating that the patient is not taking metoprolol , I have called the pharmacy and cancelled metoprolol also.  Also patient's daughter is asking for advice on any possible resources to help with supporting the patient, food, electricity, ect. Anything that may help with expenses. Please advise

## 2022-07-04 NOTE — Telephone Encounter (Signed)
Thanks for the update and calling the pharmacy Please go ahead with consult to [place] social services to see if they can help them Let family know that you put in a consult for social services to call them

## 2022-07-05 ENCOUNTER — Telehealth: Payer: Self-pay | Admitting: *Deleted

## 2022-07-05 NOTE — Progress Notes (Signed)
  Care Coordination   Note   07/05/2022 Name: [name] MRN: [id number] DOB: 01/30/1941  [name] is a 82 y.o. year old male who sees Luking, [name], MD for primary care. I reached out to Janace Litten by phone today to offer care coordination services.  [name] was given information about Care Coordination services today including:   The Care Coordination services include support from the care team which includes your Nurse Coordinator, Clinical Social Worker, or Pharmacist.  The Care Coordination team is here to help remove barriers to the health concerns and goals most important to you. Care Coordination services are voluntary, and the patient may decline or stop services at any time by request to their care team member.   Care Coordination Consent Status: Patient agreed to services and verbal consent obtained.   Follow up plan:  Telephone appointment with care coordination team member scheduled for:  07/18/22  Encounter Outcome:  Pt. Scheduled

## 2022-07-06 ENCOUNTER — Telehealth: Payer: Self-pay | Admitting: *Deleted

## 2022-07-06 NOTE — Telephone Encounter (Signed)
   Telephone encounter was:  Unsuccessful.  07/06/2022 Name: OTHOR VINES MRN: [id number] DOB: 1940/10/07  Unsuccessful outbound call made today to assist with:  Home Modifications  Outreach Attempt:  1st Attempt  A HIPAA compliant voice message was left requesting a return call.  Instructed patient to call back at [phone].  Yehuda Mao Greenauer -[facility] [place], Population Health [phone] [address] [place] , [place] [address] Email : [email or web address]

## 2022-07-06 NOTE — Telephone Encounter (Signed)
   Telephone encounter was:  Successful.  07/06/2022 Name: [name] MRN: [id number] DOB: 1940/02/21  [name] is a 82 y.o. year old male who is a primary care patient of Luking, [name], MD . The community resource team was consulted for assistance with Home Modifications  Care guide performed the following interventions: Patient provided with information about care guide support team and interviewed to confirm resource needs. talked with daughter will mail , housing solutions, housing rehab program and independent living information to her  Follow Up Plan:  No further follow up planned at this time. The patient has been provided with needed resources.  Yehuda Mao Greenauer -[facility] [place], Population Health [phone] [address] [place] , [place] [address] Email : [email or web address]

## 2022-07-17 ENCOUNTER — Ambulatory Visit: Payer: Medicare Other | Admitting: Podiatry

## 2022-07-17 ENCOUNTER — Encounter: Payer: Self-pay | Admitting: Podiatry

## 2022-07-17 DIAGNOSIS — M79676 Pain in unspecified toe(s): Secondary | ICD-10-CM

## 2022-07-17 DIAGNOSIS — B351 Tinea unguium: Secondary | ICD-10-CM | POA: Diagnosis not present

## 2022-07-17 DIAGNOSIS — N1831 Chronic kidney disease, stage 3a: Secondary | ICD-10-CM

## 2022-07-17 NOTE — Progress Notes (Signed)
This patient presents to the office with chief complaint of long thick painful nails.  Patient says the nails are painful walking and wearing shoes.  This patient is unable to self treat.  This patient is unable to trim his nails since he is unable to reach his nails.  he presents to the office for preventative foot care services.  General Appearance  Alert, conversant and in no acute stress.  Vascular  Dorsalis pedis and posterior tibial  pulses are  not palpable due to swelling   bilaterally.  Capillary return is within normal limits  bilaterally. Temperature is within normal limits  bilaterally.  Neurologic  Senn-Weinstein monofilament wire test within normal limits  bilaterally. Muscle power within normal limits bilaterally.  Nails Thick disfigured discolored nails with subungual debris  from hallux to fifth toes bilaterally. No evidence of bacterial infection or drainage bilaterally.  Orthopedic  No limitations of motion  feet .  No crepitus or effusions noted.  No bony pathology or digital deformities noted.  Skin  normotropic skin with no porokeratosis noted bilaterally.  No signs of infections or ulcers noted.     Onychomycosis  Nails  B/L.  Pain in right toes  Pain in left toes  Debridement of nails both feet followed trimming the nails with dremel tool.    RTC 3 months.   Helane Gunther DPM

## 2022-07-18 ENCOUNTER — Ambulatory Visit: Payer: Self-pay | Admitting: *Deleted

## 2022-07-18 ENCOUNTER — Encounter: Payer: Self-pay | Admitting: *Deleted

## 2022-07-18 DIAGNOSIS — I509 Heart failure, unspecified: Secondary | ICD-10-CM | POA: Diagnosis not present

## 2022-07-18 DIAGNOSIS — I2510 Atherosclerotic heart disease of native coronary artery without angina pectoris: Secondary | ICD-10-CM | POA: Diagnosis not present

## 2022-07-18 DIAGNOSIS — M6281 Muscle weakness (generalized): Secondary | ICD-10-CM | POA: Diagnosis not present

## 2022-07-18 NOTE — Patient Outreach (Signed)
Care Coordination   Follow Up Visit Note   07/18/2022  Name: DAVE MERGEN MRN: [id number] DOB: 17-Feb-1940  Kota Ciancio Verge is a 82 y.o. year old male who sees Luking, [name], MD for primary care. I spoke with patient's daughter, [name] by phone today.  What matters to the patients health and wellness today?  Receive Assistance Obtaining In-Home Care Services.   Goals Addressed             This Visit's Progress    Receive Assistance Obtaining In-Home Care Services.   On track    Care Coordination Interventions:  Interventions Today    Flowsheet Row Most Recent Value  Chronic Disease Discussed/Reviewed   Chronic disease discussed/reviewed during today's visit Chronic Kidney Disease/End Stage Renal Disease (ESRD)  General Interventions   General Interventions Discussed/Reviewed General Interventions Discussed, General Interventions Reviewed, Durable Medical Equipment (DME), Health Screening, Community Resources, Doctor Visits  Doctor Visits Discussed/Reviewed Doctor Visits Discussed, Doctor Visits Reviewed, Annual Wellness Visits, PCP  Durable Medical Equipment (DME) Bed side commode, BP Cuff, Environmental consultant, Wheelchair, Physicist, medical  PCP/Specialist Visits Compliance with follow-up visit  Exercise Interventions   Exercise Discussed/Reviewed Exercise Discussed, Exercise Reviewed, Weight Managment, Assistive device use and maintanence  Weight Management Weight loss  Education Interventions   Education Provided Provided Verbal Education  Provided Verbal Education On Nutrition, Mental Health/Coping with Illness, Exercise, Medication, When to see the doctor  Mental Health Interventions   Mental Health Discussed/Reviewed Mental Health Discussed, Mental Health Reviewed, Coping Strategies  Nutrition Interventions   Nutrition Discussed/Reviewed Nutrition Discussed, Nutrition Reviewed, Portion sizes, Decreasing sugar intake, Increaing proteins, Decreasing  fats, Decreasing salt  Pharmacy Interventions   Pharmacy Dicussed/Reviewed Medication Adherence, Affording Medications  Safety Interventions   Safety Discussed/Reviewed Safety Discussed, Safety Reviewed, Fall Risk, Home Safety  Home Safety Assistive Devices, Need for home safety assessment, Refer for community resources      Active Listening & Reflection Utilized.  Verbalization of Feelings Encouraged.  Emotional Support Provided. Caregiver Stress & Fatigue Acknowledged. Caregiver Burnout Validated. Caregiver Resources Reviewed. Caregiver Support Groups Revisited. Self-Enrollment in Caregiver Support Group of Interest Emphasized, from List Provided. Problem Solving Interventions Implemented. Task-Centered Solutions Activated.  Solution-Focused Strategies Employed.  Acceptance & Commitment Therapy Indicated. Cognitive Behavioral Therapy Initiated. Client-Centered Therapy Performed. Deep Breathing Exercises, Relaxation Techniques & Mindfulness Meditation Strategies Reviewed & Implementation Encouraged Daily. CSW Collaboration with Daughter, [name] to [employer] in Receiving Food, Surveyor, quantity, Rent, Utility & Housing Assistance. CSW Collaboration with Daughter, [name] to [employer] Receipt of Housing, Rehabilitation & Independent Living Resources, Provided by Alois Cliche -Berneda Rose, [facility] Guide with [employer], through [facility] [employer] Care Management [phone]). CSW Collaboration with Daughter, [name] to Request Review of The Following List of [employer], [employer], Wellsite geologist, Emailed on 07/18/2022: ~ [employer]' Primary school teacher ~ Merchandiser, retail & Nutrition Program  ~ Armed forces logistics/support/administrative officer, Pharmacist, hospital in [place] ~ [employer] Application ~ Scientist, forensic ~ Engineer, drilling ~ Manufacturing engineer ~ Low Income Chief Technology Officer ~ Community education officer in [place] ~ Low Income Housing in [place], [place] ~ [employer] ~ Section 8 Statistician ~ Family Caregivers ~ Home Care Services & Resources ~ Home Health Care Agencies ~ In-Home Care & Respite Agencies ~ Adult Day Care Programs ~ Independent Living Program Brochure CSW Collaboration with Daughter,  [name] to [employer] with [employer], [employer] of Interest, in An Effort to [employer], Surveyor, quantity, Advertising account executive, [employer]. CSW Collaboration with Daughter, [name] to [employer] with CSW [phone]), if She Has Questions, Needs Assistance, or If Additional Social Work Needs Are Identified Between Now & Our Next Scheduled Follow-Up Outreach Call.      SDOH assessments and interventions completed:  Yes.  Care Coordination Interventions:  Yes, provided.   Follow up plan: Follow up call scheduled for 08/01/2022 at 2:30 pm.   Encounter Outcome:  Pt. Visit Completed.   Danford Bad, BSW, MSW, LCSW  Licensed Restaurant manager, fast food [facility] System  Mailing [place] [address], [place], [place] [address] Physical Address[address], [place], [place] [address] Toll Free Main # [phone] Fax # [phone] Cell # [phone] [email or web address]

## 2022-07-18 NOTE — Patient Instructions (Signed)
Visit Information  Thank you for taking time to visit with me today. Please don't hesitate to contact me if I can be of assistance to you.   Following are the goals we discussed today:   Goals Addressed             This Visit's Progress    Receive Assistance Obtaining In-Home Care Services.   On track    Care Coordination Interventions:  Interventions Today    Flowsheet Row Most Recent Value  Chronic Disease Discussed/Reviewed   Chronic disease discussed/reviewed during today's visit Chronic Kidney Disease/End Stage Renal Disease (ESRD)  General Interventions   General Interventions Discussed/Reviewed General Interventions Discussed, General Interventions Reviewed, Durable Medical Equipment (DME), Health Screening, Community Resources, Doctor Visits  Doctor Visits Discussed/Reviewed Doctor Visits Discussed, Doctor Visits Reviewed, Annual Wellness Visits, PCP  Durable Medical Equipment (DME) Bed side commode, BP Cuff, Environmental consultant, Wheelchair, Physicist, medical  PCP/Specialist Visits Compliance with follow-up visit  Exercise Interventions   Exercise Discussed/Reviewed Exercise Discussed, Exercise Reviewed, Weight Managment, Assistive device use and maintanence  Weight Management Weight loss  Education Interventions   Education Provided Provided Verbal Education  Provided Verbal Education On Nutrition, Mental Health/Coping with Illness, Exercise, Medication, When to see the doctor  Mental Health Interventions   Mental Health Discussed/Reviewed Mental Health Discussed, Mental Health Reviewed, Coping Strategies  Nutrition Interventions   Nutrition Discussed/Reviewed Nutrition Discussed, Nutrition Reviewed, Portion sizes, Decreasing sugar intake, Increaing proteins, Decreasing fats, Decreasing salt  Pharmacy Interventions   Pharmacy Dicussed/Reviewed Medication Adherence, Affording Medications  Safety Interventions   Safety Discussed/Reviewed Safety Discussed, Safety  Reviewed, Fall Risk, Home Safety  Home Safety Assistive Devices, Need for home safety assessment, Refer for community resources      Active Listening & Reflection Utilized.  Verbalization of Feelings Encouraged.  Emotional Support Provided. Caregiver Stress & Fatigue Acknowledged. Caregiver Burnout Validated. Caregiver Resources Reviewed. Caregiver Support Groups Revisited. Self-Enrollment in Caregiver Support Group of Interest Emphasized, from List Provided. Problem Solving Interventions Implemented. Task-Centered Solutions Activated.  Solution-Focused Strategies Employed.  Acceptance & Commitment Therapy Indicated. Cognitive Behavioral Therapy Initiated. Client-Centered Therapy Performed. Deep Breathing Exercises, Relaxation Techniques & Mindfulness Meditation Strategies Reviewed & Implementation Encouraged Daily. CSW Collaboration with Daughter, [name] to [employer] in Receiving Food, Surveyor, quantity, Rent, Utility & Housing Assistance. CSW Collaboration with Daughter, [name] to [employer] Receipt of Housing, Rehabilitation & Independent Living Resources, Provided by Alois Cliche -Berneda Rose, [facility] Guide with [employer], through [facility] [employer] Care Management [phone]). CSW Collaboration with Daughter, [name] to Request Review of The Following List of Levi [employer]uss, Walt Disney, Wellsite geologist, Emailed on 07/18/2022: ~ [place] Senior Farmers' Primary school teacher ~ Merchandiser, retail & Nutrition Program  ~ Armed forces logistics/support/administrative officer, Pharmacist, hospital in [place] ~ [employer] Application ~ Scientist, forensic ~ Engineer, drilling ~ TEFL teacher ~ Low Income Chief Technology Officer ~ Community education officer in [place] ~ Low Income Housing in [place], [place] ~ [employer] Computer ~ Section 8 Statistician ~ Family Caregivers ~ Home Care Services & Resources ~ Home Health Care Agencies ~ In-Home Care & Respite Agencies ~ Adult Day Care Programs ~ Independent Living Program Brochure CSW Collaboration with Daughter, [name] to [employer] with Levi Strauss, Wellsite geologist of Interest, in An Effort to [employer]. [employer], Surveyor, quantity, Advertising account executive, Utility [employer]. CSW Collaboration with Daughter, [name]  to [employer] with CSW (# [phone]), if She Has Questions, Needs Assistance, or If Additional Social Work Needs Are Identified Between Now & Our Next Scheduled Follow-Up Outreach Call.      Our next appointment is by telephone on 08/01/2022 at 2:30 pm.   Please call the care guide team at [phone] if you need to cancel or reschedule your appointment.   If you are experiencing a Mental Health or Behavioral Health Crisis or need someone to talk to, please call the Suicide and Crisis Lifeline: 988 call the [place] National Suicide Prevention Lifeline: 916-597-2743 or TTY: 787-155-9319 TTY 872 542 2469) to talk to a trained counselor call 1-800-273-TALK (toll free, 24 hour hotline) go to [facility] [address], [place] [phone]) call the [facility] Crisis Line: [phone] call 911  Patient verbalizes understanding of instructions and care plan provided today and agrees to view in MyChart. Active MyChart status and patient understanding of how to access instructions and care plan via MyChart confirmed with patient.     Telephone follow up appointment with care management team member scheduled for:  08/01/2022 at 2:30 pm.   Danford Bad, BSW, MSW, LCSW  Licensed Clinical Social Worker  [facility]  Mailing [place]. [address], [place], [place] [address] Physical Address[address]  [place], [place] [address] Toll Free Main # [phone] Fax # [phone] Cell # [phone] [email or web address]

## 2022-07-21 DIAGNOSIS — R0902 Hypoxemia: Secondary | ICD-10-CM | POA: Diagnosis not present

## 2022-07-21 DIAGNOSIS — J9601 Acute respiratory failure with hypoxia: Secondary | ICD-10-CM | POA: Diagnosis not present

## 2022-07-21 DIAGNOSIS — J441 Chronic obstructive pulmonary disease with (acute) exacerbation: Secondary | ICD-10-CM | POA: Diagnosis not present

## 2022-07-26 ENCOUNTER — Telehealth: Payer: Self-pay | Admitting: Family Medicine

## 2022-07-26 MED ORDER — SPIRONOLACTONE 25 MG PO TABS: 12.5000 mg | ORAL_TABLET | Freq: Every day | ORAL | 1 refills | Status: DC

## 2022-07-26 NOTE — Telephone Encounter (Signed)
Refill on    spironolactone (ALDACTONE) 25 MG tablet  Sent to [facility]

## 2022-08-01 ENCOUNTER — Ambulatory Visit: Payer: Self-pay | Admitting: *Deleted

## 2022-08-01 ENCOUNTER — Encounter: Payer: Self-pay | Admitting: *Deleted

## 2022-08-01 NOTE — Patient Instructions (Signed)
Visit Information  Thank you for taking time to visit with me today. Please don't hesitate to contact me if I can be of assistance to you.   Following are the goals we discussed today:   Goals Addressed             This Visit's Progress    Receive Assistance Obtaining In-Home Care Services.   On track    Care Coordination Interventions:  Interventions Today    Flowsheet Row Most Recent Value  Chronic Disease Discussed/Reviewed   Chronic disease discussed/reviewed during today's visit Chronic Kidney Disease/End Stage Renal Disease (ESRD)  General Interventions   General Interventions Discussed/Reviewed General Interventions Discussed, General Interventions Reviewed, Durable Medical Equipment (DME), Health Screening, Community Resources, Doctor Visits  Doctor Visits Discussed/Reviewed Doctor Visits Discussed, Doctor Visits Reviewed, Annual Wellness Visits, PCP  Durable Medical Equipment (DME) Bed side commode, BP Cuff, Environmental consultant, Wheelchair, Physicist, medical  PCP/Specialist Visits Compliance with follow-up visit  Exercise Interventions   Exercise Discussed/Reviewed Exercise Discussed, Exercise Reviewed, Weight Managment, Assistive device use and maintanence  Weight Management Weight loss  Education Interventions   Education Provided Provided Verbal Education  Provided Verbal Education On Nutrition, Mental Health/Coping with Illness, Exercise, Medication, When to see the doctor  Mental Health Interventions   Mental Health Discussed/Reviewed Mental Health Discussed, Mental Health Reviewed, Coping Strategies  Nutrition Interventions   Nutrition Discussed/Reviewed Nutrition Discussed, Nutrition Reviewed, Portion sizes, Decreasing sugar intake, Increaing proteins, Decreasing fats, Decreasing salt  Pharmacy Interventions   Pharmacy Dicussed/Reviewed Medication Adherence, Affording Medications  Safety Interventions   Safety Discussed/Reviewed Safety Discussed, Safety  Reviewed, Fall Risk, Home Safety  Home Safety Assistive Devices, Need for home safety assessment, Refer for community resources      Active Listening & Reflection Utilized.  Verbalization of Feelings Encouraged.  Emotional Support Provided. Caregiver Stress & Fatigue Acknowledged. Caregiver Burnout Validated. Caregiver Resources Reviewed. Caregiver Support Groups Revisited. Self-Enrollment in Caregiver Support Group of Interest Emphasized, from List Provided. Problem Solving Interventions Implemented. Task-Centered Solutions Activated.  Solution-Focused Strategies Employed.  Acceptance & Commitment Therapy Indicated. Cognitive Behavioral Therapy Initiated. Client-Centered Therapy Performed. Deep Breathing Exercises, Relaxation Techniques & Mindfulness Meditation Strategies Reviewed & Implementation Encouraged Daily. CSW Collaboration with Daughter, [name] to Confirm Receipt & Begin Review of The Following List of Levi Strauss, Walt Disney, Resources, Occupational psychologist, to [employer] & Entertain Questions: ~ [place] Senior Farmers' Primary school teacher ~ [employer] Support & Nutrition Program  ~ Armed forces logistics/support/administrative officer, Pharmacist, hospital in [place] ~ Computer Sciences Corporation ~ Scientist, forensic ~ Engineer, drilling ~ TEFL teacher ~ Low Income Chief Technology Officer ~ Community education officer in [place] ~ Low Income Housing in [place], [place] ~ FirstEnergy Corp ~ Section 8 Statistician ~ Family Caregivers Brochure ~ Home Care Services & Resources ~ Home Health Care Agencies ~ In-Home Care & Respite Agencies ~ Adult Day Care Programs ~ Independent Living Program Brochure ~ 2023 Medicaid Tips ~ [employer] Application ~ Merchant navy officer ~ Theatre manager Providers ~ Risk manager ~ Housing & Home Improvement Assistance Program ~ Project CARE (Caregiver Alternatives to Running on Empty) Flyer ~ [employer] Family Caregiver Support Program Brochure CSW Collaboration with Daughter, [name] to [employer] with [employer]uss, Nurse, adult, Tourist information centre manager, in An Effort to [employer], Surveyor, quantity, Rent, Utility, Nurse, learning disability, Psychologist, clinical. CSW Collaboration with Daughter,  [name] to Agree to Assist with The Following List of Applications, During Next Several Scheduled Follow-Up Outreach Calls: ~ [place] Senior Farmers' Primary school teacher ~ [employer] Corporation ~ Scientist, forensic ~ TEFL teacher ~ Low Income Chief Technology Officer ~ Section 8 Housing Application ~ [employer] Application ~ Merchant navy officer ~ Housing & Home Improvement Assistance Program CSW Collaboration with Daughter, [name] to [employer] with CSW (# [phone]), if She Has Questions, Needs Assistance, or If Additional Social Work Needs Are Identified Between Now & Our Next Scheduled Follow-Up Outreach Call.      Our next appointment is by telephone on 08/15/2022 at 3:45 pm.  Please call the care guide team at [phone] if you need to cancel or reschedule your appointment.   If you are experiencing a Mental Health or Behavioral Health Crisis or need someone to talk to, please call the Suicide and Crisis Lifeline: 988 call the [place] National Suicide Prevention Lifeline: 785-208-4047 or TTY: (347)196-4090 TTY (502) 178-5630) to talk to a trained counselor call 1-800-273-TALK (toll free, 24 hour hotline) go to [facility] Care [address], [place] [phone]) call the [place] Crisis Line: [phone] call 911  Patient verbalizes understanding of instructions and care plan  provided today and agrees to view in MyChart. Active MyChart status and patient understanding of how to access instructions and care plan via MyChart confirmed with patient.     Telephone follow up appointment with care management team member scheduled for:  08/15/2022 at 3:45 pm.  Danford Bad, BSW, MSW, LCSW  Licensed Clinical Social Worker  [facility]  Mailing [place]. [address], [place], [place] [address] Physical Address-[address], [place], [place] [address] Toll Free Main # [phone] Fax # [phone] Cell # [phone] [email or web address]

## 2022-08-01 NOTE — Patient Outreach (Signed)
Care Coordination   Follow Up Visit Note   08/01/2022  Name: [name] MRN: [id number] DOB: 10/07/1940  [name] is a 82 y.o. year old male who sees Luking, [name], MD for primary care. I spoke with patient's daughter, [name] by phone today.  What matters to the patients health and wellness today?  Receive Assistance Obtaining In-Home Care Services.   Goals Addressed             This Visit's Progress    Receive Assistance Obtaining In-Home Care Services.   On track    Care Coordination Interventions:  Interventions Today    Flowsheet Row Most Recent Value  Chronic Disease Discussed/Reviewed   Chronic disease discussed/reviewed during today's visit Chronic Kidney Disease/End Stage Renal Disease (ESRD)  General Interventions   General Interventions Discussed/Reviewed General Interventions Discussed, General Interventions Reviewed, Durable Medical Equipment (DME), Health Screening, Community Resources, Doctor Visits  Doctor Visits Discussed/Reviewed Doctor Visits Discussed, Doctor Visits Reviewed, Annual Wellness Visits, PCP  Durable Medical Equipment (DME) Bed side commode, BP Cuff, Environmental consultant, Wheelchair, Physicist, medical  PCP/Specialist Visits Compliance with follow-up visit  Exercise Interventions   Exercise Discussed/Reviewed Exercise Discussed, Exercise Reviewed, Weight Managment, Assistive device use and maintanence  Weight Management Weight loss  Education Interventions   Education Provided Provided Verbal Education  Provided Verbal Education On Nutrition, Mental Health/Coping with Illness, Exercise, Medication, When to see the doctor  Mental Health Interventions   Mental Health Discussed/Reviewed Mental Health Discussed, Mental Health Reviewed, Coping Strategies  Nutrition Interventions   Nutrition Discussed/Reviewed Nutrition Discussed, Nutrition Reviewed, Portion sizes, Decreasing sugar intake, Increaing proteins, Decreasing  fats, Decreasing salt  Pharmacy Interventions   Pharmacy Dicussed/Reviewed Medication Adherence, Affording Medications  Safety Interventions   Safety Discussed/Reviewed Safety Discussed, Safety Reviewed, Fall Risk, Home Safety  Home Safety Assistive Devices, Need for home safety assessment, Refer for community resources      Active Listening & Reflection Utilized.  Verbalization of Feelings Encouraged.  Emotional Support Provided. Caregiver Stress & Fatigue Acknowledged. Caregiver Burnout Validated. Caregiver Resources Reviewed. Caregiver Support Groups Revisited. Self-Enrollment in Caregiver Support Group of Interest Emphasized, from List Provided. Problem Solving Interventions Implemented. Task-Centered Solutions Activated.  Solution-Focused Strategies Employed.  Acceptance & Commitment Therapy Indicated. Cognitive Behavioral Therapy Initiated. Client-Centered Therapy Performed. Deep Breathing Exercises, Relaxation Techniques & Mindfulness Meditation Strategies Reviewed & Implementation Encouraged Daily. CSW Collaboration with Daughter, [name] to Confirm Receipt & Begin Review of The Following List of Levi Strauss, Walt Disney, Resources, Occupational psychologist, to [employer] & Entertain Questions: ~ [place] Senior Farmers' Primary school teacher ~ [employer] Support & Nutrition Program  ~ Armed forces logistics/support/administrative officer, Pharmacist, hospital in [place] ~ [employer] Sciences Corporation ~ Scientist, forensic ~ Engineer, drilling ~ TEFL teacher ~ Low Income Chief Technology Officer ~ Community education officer in [place] ~ Low Income Housing in [place], [place] ~ [employer] ~ Section 8 Statistician ~ Family Caregivers Brochure ~ Home Care Services & Resources ~ Home Health Care Agencies ~ In-Home Care & Respite Agencies ~ Adult Day Care  Programs ~ Independent Living Program Brochure ~ 2023 Medicaid Tips ~ [employer] Application ~ Merchant navy officer ~ Theatre manager Providers ~ Scientific laboratory technician ~ Housing & Home Improvement Assistance Program ~ Project CARE (Caregiver Alternatives to Running on Empty) Flyer ~ [employer] Family Caregiver Support Program Brochure CSW Collaboration with Daughter, [name]  Radiographer, therapeutic to [employer] with Levi Strauss, Walt Disney, Tourist information centre manager, in An Effort to [employer], Surveyor, quantity, Advertising account executive, Utility, Nurse, learning disability, Psychologist, clinical. CSW Collaboration with Daughter, [name] to Agree to Assist with The Following List of Applications, During Next Several Scheduled Follow-Up Outreach Calls: ~ [place] Senior Farmers' Primary school teacher ~ [employer] ~ Scientist, forensic ~ TEFL teacher ~ Low Income Chief Technology Officer ~ Section 8 Housing Application ~ [employer] Application ~ Merchant navy officer ~ Housing & Home Improvement Assistance Program CSW Collaboration with Daughter, [name] to [employer] with CSW (# [phone]), if She Has Questions, Needs Assistance, or If Additional Social Work Needs Are Identified Between Now & Our Next Scheduled Follow-Up Outreach Call.      SDOH assessments and interventions completed:  Yes.  Care Coordination Interventions:  Yes, provided.   Follow up plan: Follow up call scheduled for 08/15/2022 at 3:45 pm.  Encounter Outcome:  Pt. Visit Completed.   Danford Bad, BSW, MSW, LCSW  Licensed Restaurant manager, [facility]  Mailing [place], [place], [place] [address] Physical Address[address], [place], [place] [address] Toll Free Main # [phone] Fax # [phone] Cell #  [phone] [email or web address]

## 2022-08-07 ENCOUNTER — Encounter: Payer: Self-pay | Admitting: *Deleted

## 2022-08-08 ENCOUNTER — Other Ambulatory Visit: Payer: Self-pay | Admitting: *Deleted

## 2022-08-08 MED ORDER — CARVEDILOL 12.5 MG PO TABS: 12.5000 mg | ORAL_TABLET | Freq: Two times a day (BID) | ORAL | 0 refills | Status: DC

## 2022-08-08 MED ORDER — TAMSULOSIN HCL 0.4 MG PO CAPS: 0.4000 mg | ORAL_CAPSULE | Freq: Every day | ORAL | 0 refills | Status: DC

## 2022-08-15 ENCOUNTER — Ambulatory Visit: Payer: Self-pay | Admitting: *Deleted

## 2022-08-15 NOTE — Patient Instructions (Signed)
Visit Information  Thank you for taking time to visit with me today. Please don't hesitate to contact me if I can be of assistance to you.   Following are the goals we discussed today:   Goals Addressed               This Visit's Progress     Patient Stated     Effective management of CHF (pt-stated)   Not on track     Care Coordination Interventions: Interventions Today    Flowsheet Row Most Recent Value  Chronic Disease   Chronic disease during today's visit Congestive Heart Failure (CHF), Hypertension (HTN), Other  General Interventions   General Interventions Discussed/Reviewed General Interventions Reviewed, Doctor Visits  Doctor Visits Discussed/Reviewed Doctor Visits Reviewed, PCP, Specialist  PCP/Specialist Visits Compliance with follow-up visit  Exercise Interventions   Exercise Discussed/Reviewed Weight Managment, Exercise Discussed  Physical Activity Discussed/Reviewed Physical Activity Discussed, Home Exercise Program (HEP)  Weight Management Weight loss  Education Interventions   Education Provided Provided Education  Provided Verbal Education On Nutrition, Exercise  Mental Health Interventions   Mental Health Discussed/Reviewed Mental Health Discussed, Coping Strategies  Nutrition Interventions   Nutrition Discussed/Reviewed Nutrition Discussed, Adding fruits and vegetables, Fluid intake, Portion sizes, Increasing proteins             Our next appointment is by telephone on 09/02/25 at 1100  Please call the care guide team at [phone] if you need to cancel or reschedule your appointment.   If you are experiencing a Mental Health or Behavioral Health Crisis or need someone to talk to, please call the Suicide and Crisis Lifeline: 988 call the [place] National Suicide Prevention Lifeline: 979-738-4424 or TTY: 601 253 8123 TTY [phone]) to talk to a trained counselor call 1-800-273-TALK (toll free, 24 hour hotline) call the [facility]: [phone] call 911   Patient verbalizes understanding of instructions and care plan provided today and agrees to view in MyChart. Active MyChart status and patient understanding of how to access instructions and care plan via MyChart confirmed with patient.     The patient has been provided with contact information for the care management team and has been advised to call with any health related questions or concerns.   [place] Oborn L. Noelle Penner, RN, BSN, CCM [facility] Care Management Community Coordinator Office number [phone]

## 2022-08-16 ENCOUNTER — Encounter: Payer: Self-pay | Admitting: *Deleted

## 2022-08-16 NOTE — Patient Outreach (Signed)
Care Coordination   Follow Up Visit Note   08/16/2022 - Late Entry  Name: [name] MRN: [id number] DOB: 01/22/1941  [name] is a 82 y.o. year old male who sees Luking, [name], MD for primary care. I spoke with patient's daughter, [name] by phone today.  What matters to the patients health and wellness today?  Receive Assistance Obtaining In-Home Care Services.   Goals Addressed             This Visit's Progress    Receive Assistance Obtaining In-Home Care Services.   On track    Care Coordination Interventions:  Interventions Today    Flowsheet Row Most Recent Value  Chronic Disease Discussed/Reviewed   Chronic disease discussed/reviewed during today's visit Chronic Kidney Disease/End Stage Renal Disease (ESRD)  General Interventions   General Interventions Discussed/Reviewed General Interventions Discussed, General Interventions Reviewed, Durable Medical Equipment (DME), Health Screening, Community Resources, Doctor Visits  Doctor Visits Discussed/Reviewed Doctor Visits Discussed, Doctor Visits Reviewed, Annual Wellness Visits, PCP  Durable Medical Equipment (DME) Bed side commode, BP Cuff, Environmental consultant, Wheelchair, Physicist, medical  PCP/Specialist Visits Compliance with follow-up visit  Exercise Interventions   Exercise Discussed/Reviewed Exercise Discussed, Exercise Reviewed, Weight Managment, Assistive device use and maintanence  Weight Management Weight loss  Education Interventions   Education Provided Provided Verbal Education  Provided Verbal Education On Nutrition, Mental Health/Coping with Illness, Exercise, Medication, When to see the doctor  Mental Health Interventions   Mental Health Discussed/Reviewed Mental Health Discussed, Mental Health Reviewed, Coping Strategies  Nutrition Interventions   Nutrition Discussed/Reviewed Nutrition Discussed, Nutrition Reviewed, Portion sizes, Decreasing sugar intake, Increaing proteins,  Decreasing fats, Decreasing salt  Pharmacy Interventions   Pharmacy Dicussed/Reviewed Medication Adherence, Affording Medications  Safety Interventions   Safety Discussed/Reviewed Safety Discussed, Safety Reviewed, Fall Risk, Home Safety  Home Safety Assistive Devices, Need for home safety assessment, Refer for community resources      Active Listening & Reflection Utilized.  Verbalization of Feelings Encouraged.  Emotional Support Provided. Caregiver Stress & Fatigue Acknowledged. Caregiver Burnout Validated. Problem Solving Interventions Implemented. Task-Centered Solutions Activated.  Solution-Focused Strategies Employed.  Acceptance & Commitment Therapy Indicated. Cognitive Behavioral Therapy Initiated. Client-Centered Therapy Performed. CSW Collaboration with Daughter, [name] to Encourage Continued Outreach to [employer], [employer], Field seismologist of Interest in [place], in An Effort to [employer] & Nutritional Assistance, Rent & TEFL teacher, Housing Repairs & Home Improvement Assistance, In-Home Care & Respite Assistance, Family Caregiver Support & Assistance, Etc.: ~ Merchandiser, retail & Nutrition Program  ~ Armed forces logistics/support/administrative officer, Stage manager, Psychologist, prison and probation services  ~ Engineer, drilling ~ Family Caregivers Brochure ~ Home Care Services & Resources ~ Home Health Care Agencies ~ In-Home Care & Respite Agencies ~ Adult Day Care Programs ~ Independent Living Program Brochure ~ 2023 Medicaid Tips ~ Theatre manager Providers ~ Scientific laboratory technician ~ Housing & Home Improvement Assistance Program ~ Project CARE (Caregiver Alternatives to Running on Empty) Flyer ~ [employer] Family Caregiver Support Program Brochure CSW Collaboration with Daughter, [name] to Begin Assisting with The Following List of Applications: ~ [place] Senior Farmers' Primary school teacher ~ [employer] ~ Engineer, site ~ TEFL teacher ~ Low Income Chief Technology Officer ~ Personal assistant ~ Merchant navy officer ~ Housing & Home Improvement Assistance Program CSW Collaboration with Daughter, [name] to [employer] with CSW (# [phone]), if She Has Questions,  Needs Assistance, or If Additional Social Work Needs Are Identified Between Now & Our Next Scheduled Follow-Up Outreach Call.      SDOH assessments and interventions completed:  Yes.  Care Coordination Interventions:  Yes, provided.   Follow up plan: Follow up call scheduled for 08/28/2022 at 3:30 pm.   Encounter Outcome:  Pt. Visit Completed.   Danford Bad, BSW, MSW, LCSW  Licensed Restaurant manager, fast food [facility]  Mailing [place] [address], [place], [place] [address] Physical Address[address], [place], [place] [address] Toll Free Main # [phone] Fax # [phone] Cell # [phone] [email or web address]

## 2022-08-16 NOTE — Patient Instructions (Signed)
Visit Information  Thank you for taking time to visit with me today. Please don't hesitate to contact me if I can be of assistance to you.   Following are the goals we discussed today:   Goals Addressed             This Visit's Progress    Receive Assistance Obtaining In-Home Care Services.   On track    Care Coordination Interventions:  Interventions Today    Flowsheet Row Most Recent Value  Chronic Disease Discussed/Reviewed   Chronic disease discussed/reviewed during today's visit Chronic Kidney Disease/End Stage Renal Disease (ESRD)  General Interventions   General Interventions Discussed/Reviewed General Interventions Discussed, General Interventions Reviewed, Durable Medical Equipment (DME), Health Screening, Community Resources, Doctor Visits  Doctor Visits Discussed/Reviewed Doctor Visits Discussed, Doctor Visits Reviewed, Annual Wellness Visits, PCP  Durable Medical Equipment (DME) Bed side commode, BP Cuff, Environmental consultant, Wheelchair, Physicist, medical  PCP/Specialist Visits Compliance with follow-up visit  Exercise Interventions   Exercise Discussed/Reviewed Exercise Discussed, Exercise Reviewed, Weight Managment, Assistive device use and maintanence  Weight Management Weight loss  Education Interventions   Education Provided Provided Verbal Education  Provided Verbal Education On Nutrition, Mental Health/Coping with Illness, Exercise, Medication, When to see the doctor  Mental Health Interventions   Mental Health Discussed/Reviewed Mental Health Discussed, Mental Health Reviewed, Coping Strategies  Nutrition Interventions   Nutrition Discussed/Reviewed Nutrition Discussed, Nutrition Reviewed, Portion sizes, Decreasing sugar intake, Increaing proteins, Decreasing fats, Decreasing salt  Pharmacy Interventions   Pharmacy Dicussed/Reviewed Medication Adherence, Affording Medications  Safety Interventions   Safety Discussed/Reviewed Safety Discussed, Safety  Reviewed, Fall Risk, Home Safety  Home Safety Assistive Devices, Need for home safety assessment, Refer for community resources      Active Listening & Reflection Utilized.  Verbalization of Feelings Encouraged.  Emotional Support Provided. Caregiver Stress & Fatigue Acknowledged. Caregiver Burnout Validated. Problem Solving Interventions Implemented. Task-Centered Solutions Activated.  Solution-Focused Strategies Employed.  Acceptance & Commitment Therapy Indicated. Cognitive Behavioral Therapy Initiated. Client-Centered Therapy Performed. CSW Collaboration with Daughter, [name] to Encourage Continued Outreach to [employer], [employer], Field seismologist of Interest in [place], in An Effort to [employer] & Nutritional Assistance, Rent & TEFL teacher, Housing Repairs & Home Improvement Assistance, In-Home Care & Respite Assistance, Family Caregiver Support & Assistance, Etc.: ~ Merchandiser, retail & Nutrition Program  ~ Armed forces logistics/support/administrative officer, Stage manager, Psychologist, prison and probation services  ~ Engineer, drilling ~ Family Caregivers Brochure ~ Home Care Services & Resources ~ Home Health Care Agencies ~ In-Home Care & Respite Agencies ~ Adult Day Care Programs ~ Independent Living Program Brochure ~ 2023 Medicaid Tips ~ Theatre manager Providers ~ Scientific laboratory technician ~ Housing & Home Improvement Assistance Program ~ Project CARE (Caregiver Alternatives to Running on Empty) Flyer ~ [employer] Family Caregiver Support Program Brochure CSW Collaboration with Daughter, [name] to Begin Assisting with The Following List of Applications: ~ [place] Senior Farmers' Primary school teacher ~ [employer] ~ Scientist, forensic ~ TEFL teacher ~ Low Income Chief Technology Officer ~ [employer] Application ~ Merchant navy officer ~ Housing & Home Improvement  Assistance Program CSW Collaboration with Daughter, [name] to [employer] with CSW (# [phone]), if She Has Questions, Needs Assistance, or If Additional Social Work Needs Are Identified Between Now & Our Next Scheduled Follow-Up Outreach Call.      Our next appointment is by telephone on 08/28/2022 at 3:30  pm.   Please call the care guide team at [phone] if you need to cancel or reschedule your appointment.   If you are experiencing a Mental Health or Behavioral Health Crisis or need someone to talk to, please call the Suicide and Crisis Lifeline: 988 call the [place] National Suicide Prevention Lifeline: 201-541-7090 or TTY: 7787603436 TTY 9726916990) to talk to a trained counselor call 1-800-273-TALK (toll free, 24 hour hotline) go to [facility] [address], [place] [phone]) call the [facility] Crisis Line: [phone] call 911  Patient verbalizes understanding of instructions and care plan provided today and agrees to view in MyChart. Active MyChart status and patient understanding of how to access instructions and care plan via MyChart confirmed with patient.     Telephone follow up appointment with care management team member scheduled for:  08/28/2022 at 3:30 pm.   Danford Bad, BSW, MSW, LCSW  Licensed Clinical Social Worker  [facility]  Mailing [place]. [address], [place], [place] [address] Physical Address[address] [place], [place] [address] Toll Free Main # [phone] Fax # [phone] Cell # [phone] [email or web address]

## 2022-08-17 DIAGNOSIS — I509 Heart failure, unspecified: Secondary | ICD-10-CM | POA: Diagnosis not present

## 2022-08-17 DIAGNOSIS — M6281 Muscle weakness (generalized): Secondary | ICD-10-CM | POA: Diagnosis not present

## 2022-08-17 DIAGNOSIS — I2510 Atherosclerotic heart disease of native coronary artery without angina pectoris: Secondary | ICD-10-CM | POA: Diagnosis not present

## 2022-08-20 ENCOUNTER — Ambulatory Visit: Payer: Medicare Other | Admitting: Family Medicine

## 2022-08-20 ENCOUNTER — Other Ambulatory Visit: Payer: Self-pay | Admitting: Family Medicine

## 2022-08-20 DIAGNOSIS — J9601 Acute respiratory failure with hypoxia: Secondary | ICD-10-CM | POA: Diagnosis not present

## 2022-08-20 DIAGNOSIS — R0902 Hypoxemia: Secondary | ICD-10-CM | POA: Diagnosis not present

## 2022-08-20 DIAGNOSIS — J441 Chronic obstructive pulmonary disease with (acute) exacerbation: Secondary | ICD-10-CM | POA: Diagnosis not present

## 2022-08-28 ENCOUNTER — Encounter: Payer: Self-pay | Admitting: *Deleted

## 2022-08-28 ENCOUNTER — Ambulatory Visit: Payer: Self-pay | Admitting: *Deleted

## 2022-08-28 NOTE — Patient Outreach (Signed)
Care Coordination   Follow Up Visit Note   08/28/2022  Name: CLEVEN JANSMA MRN: [id number] DOB: [birth year]  Leshaun Biebel Berrie is a 82 y.o. year old male who sees Luking, [name], MD for primary care. I spoke with patient's daughter, [name] by phone today.  What matters to the patients health and wellness today?  Receive Assistance Obtaining In-Home Care Services.   Goals Addressed             This Visit's Progress    Receive Assistance Obtaining In-Home Care Services.   On track    Care Coordination Interventions:  Interventions Today    Flowsheet Row Most Recent Value  Chronic Disease Discussed/Reviewed   Chronic disease discussed/reviewed during today's visit Chronic Kidney Disease/End Stage Renal Disease (ESRD)  General Interventions   General Interventions Discussed/Reviewed General Interventions Discussed, General Interventions Reviewed, Durable Medical Equipment (DME), Health Screening, Community Resources, Doctor Visits  Doctor Visits Discussed/Reviewed Doctor Visits Discussed, Doctor Visits Reviewed, Annual Wellness Visits, PCP  Durable Medical Equipment (DME) Bed side commode, BP Cuff, Environmental consultant, Wheelchair, Physicist, medical  PCP/Specialist Visits Compliance with follow-up visit  Exercise Interventions   Exercise Discussed/Reviewed Exercise Discussed, Exercise Reviewed, Weight Managment, Assistive device use and maintanence  Weight Management Weight loss  Education Interventions   Education Provided Provided Verbal Education  Provided Verbal Education On Nutrition, Mental Health/Coping with Illness, Exercise, Medication, When to see the doctor  Mental Health Interventions   Mental Health Discussed/Reviewed Mental Health Discussed, Mental Health Reviewed, Coping Strategies  Nutrition Interventions   Nutrition Discussed/Reviewed Nutrition Discussed, Nutrition Reviewed, Portion sizes, Decreasing sugar intake, Increaing proteins, Decreasing  fats, Decreasing salt  Pharmacy Interventions   Pharmacy Dicussed/Reviewed Medication Adherence, Affording Medications  Safety Interventions   Safety Discussed/Reviewed Safety Discussed, Safety Reviewed, Fall Risk, Home Safety  Home Safety Assistive Devices, Need for home safety assessment, Refer for community resources      Active Listening & Reflection Utilized.  Verbalization of Feelings Encouraged.  Emotional Support Provided. Caregiver Stress & Fatigue Acknowledged. Caregiver Burnout Validated. Problem Solving Interventions Implemented. Task-Centered Solutions Activated.  Solution-Focused Strategies Employed.  Acceptance & Commitment Therapy Indicated. Cognitive Behavioral Therapy Initiated. Client-Centered Therapy Performed. CSW Collaboration with Daughter, [name] to Encourage Attendance at Follow-Up Appointment for Patient with Primary Care Provider, Dr. Lilyan Punt with [facility] [phone]), Scheduled on 09/10/2022 at 3:10 PM. CSW Collaboration with Daughter, [name] to Encourage Continued Outreach to [employer] of Interest in [place], from List Provided, in An Effort to [employer] & Nutritional Services, Rent & TEFL teacher, Housing Repairs & Home Improvement/Modifications, In-Home Care & Architectural technologist, & Family Caregiver Support & Resources. CSW Collaboration with Daughter, [name] to Encourage Continued Completion of Applications to [employer] of Interest in [place], from List Provided, in An Effort to [employer] & Ecologist, Rent & TEFL teacher, Housing Repairs & Home Improvement/Modifications, In-Home Care & Respite Services, & Family Caregiver Support & Resources. CSW Collaboration with Daughter, [name] to [employer] with CSW [phone]), if She Has Questions, Needs Assistance, or If Additional Social Work Needs Are Identified  Between Now & Our Next Scheduled Follow-Up Outreach Call.      SDOH assessments and interventions completed:  Yes.  Care Coordination Interventions:  Yes, provided.   Follow up plan: Follow up call scheduled for 09/26/2022 at 9:00 am.  Encounter Outcome:  Pt. Visit Completed.   Danford Bad, BSW, MSW, [employer]  Licensed Clinical Social Financial planner [facility]  Mailing [place] [address] [place], [place] [address] Physical Address[address] [place], [place] [address] Toll Free Main # [phone] Fax # [phone] Cell # [phone] [email or web address]

## 2022-08-28 NOTE — Patient Instructions (Signed)
Visit Information  Thank you for taking time to visit with me today. Please don't hesitate to contact me if I can be of assistance to you.   Following are the goals we discussed today:   Goals Addressed             This Visit's Progress    Receive Assistance Obtaining In-Home Care Services.   On track    Care Coordination Interventions:  Interventions Today    Flowsheet Row Most Recent Value  Chronic Disease Discussed/Reviewed   Chronic disease discussed/reviewed during today's visit Chronic Kidney Disease/End Stage Renal Disease (ESRD)  General Interventions   General Interventions Discussed/Reviewed General Interventions Discussed, General Interventions Reviewed, Durable Medical Equipment (DME), Health Screening, Community Resources, Doctor Visits  Doctor Visits Discussed/Reviewed Doctor Visits Discussed, Doctor Visits Reviewed, Annual Wellness Visits, PCP  Durable Medical Equipment (DME) Bed side commode, BP Cuff, Environmental consultant, Wheelchair, Physicist, medical  PCP/Specialist Visits Compliance with follow-up visit  Exercise Interventions   Exercise Discussed/Reviewed Exercise Discussed, Exercise Reviewed, Weight Managment, Assistive device use and maintanence  Weight Management Weight loss  Education Interventions   Education Provided Provided Verbal Education  Provided Verbal Education On Nutrition, Mental Health/Coping with Illness, Exercise, Medication, When to see the doctor  Mental Health Interventions   Mental Health Discussed/Reviewed Mental Health Discussed, Mental Health Reviewed, Coping Strategies  Nutrition Interventions   Nutrition Discussed/Reviewed Nutrition Discussed, Nutrition Reviewed, Portion sizes, Decreasing sugar intake, Increaing proteins, Decreasing fats, Decreasing salt  Pharmacy Interventions   Pharmacy Dicussed/Reviewed Medication Adherence, Affording Medications  Safety Interventions   Safety Discussed/Reviewed Safety Discussed, Safety  Reviewed, Fall Risk, Home Safety  Home Safety Assistive Devices, Need for home safety assessment, Refer for community resources      Active Listening & Reflection Utilized.  Verbalization of Feelings Encouraged.  Emotional Support Provided. Caregiver Stress & Fatigue Acknowledged. Caregiver Burnout Validated. Problem Solving Interventions Implemented. Task-Centered Solutions Activated.  Solution-Focused Strategies Employed.  Acceptance & Commitment Therapy Indicated. Cognitive Behavioral Therapy Initiated. Client-Centered Therapy Performed. CSW Collaboration with Daughter, [name] to Encourage Attendance at Follow-Up Appointment for Patient with Primary Care Provider, Dr. Lilyan Punt with [facility] [phone]), Scheduled on 09/10/2022 at 3:10 PM. CSW Collaboration with Daughter, [name] to Encourage Continued Outreach to [employer] of Interest in [place], from List Provided, in An Effort to [employer] & Nutritional Services, Rent & TEFL teacher, Housing Repairs & Home Improvement/Modifications, In-Home Care & Architectural technologist, & Family Caregiver Support & Resources. CSW Collaboration with Daughter, [name] to Encourage Continued Completion of Applications to [employer] of Interest in [place], from List Provided, in An Effort to [employer] & Ecologist, Rent & TEFL teacher, Housing Repairs & Home Improvement/Modifications, In-Home Care & Respite Services, & Family Caregiver Support & Resources. CSW Collaboration with Daughter, [name] to [employer] with CSW [phone]), if She Has Questions, Needs Assistance, or If Additional Social Work Needs Are Identified Between Now & Our Next Scheduled Follow-Up Outreach Call.      Our next appointment is by telephone on 09/26/2022 at 9:00 am.  Please call the care guide team at [phone] if you need to cancel  or reschedule your appointment.   If you are experiencing a Mental Health or Behavioral Health Crisis or need someone to talk to, please call the Suicide and Crisis Lifeline: 988 call the [place] National Suicide Prevention Lifeline: (279)851-8241 or TTY: 931-843-6802 TTY 616-161-7686) to talk to a  trained counselor call 1-800-273-TALK (toll free, 24 hour hotline) go to [facility] [address] [place] [phone]) call the [facility] Crisis Line: [phone] call 911  Patient verbalizes understanding of instructions and care plan provided today and agrees to view in MyChart. Active MyChart status and patient understanding of how to access instructions and care plan via MyChart confirmed with patient.     Telephone follow up appointment with care management team member scheduled for:  09/26/2022 at 9:00 am.  Danford Bad, BSW, MSW, LCSW  Licensed Clinical Social Worker  [facility]  Mailing [place]. [address] [place], [place] [address] Physical Address[address], [place], [place] [address] Toll Free Main # [phone] Fax # [phone] Cell # [phone] [email or web address]

## 2022-09-03 ENCOUNTER — Ambulatory Visit: Payer: Self-pay | Admitting: *Deleted

## 2022-09-03 NOTE — Patient Instructions (Signed)
Visit Information  Thank you for taking time to visit with me today. Please don't hesitate to contact me if I can be of assistance to you.   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   If you are experiencing a Mental Health or Behavioral Health Crisis or need someone to talk to, please call the Suicide and Crisis Lifeline: 988 call the [place] National Suicide Prevention Lifeline: 4247923236 or TTY: 831-354-8340 TTY 2607823158) to talk to a trained counselor call 1-800-273-TALK (toll free, 24 hour hotline) go to [facility] [address], [place] [phone]) call the [facility] Crisis Line: [phone] call 911   Patient verbalizes understanding of instructions and care plan provided today and agrees to view in MyChart. Active MyChart status and patient understanding of how to access instructions and care plan via MyChart confirmed with patient.     The patient has been provided with contact information for the care management team and has been advised to call with any health related questions or concerns.   Iain Sawchuk L. Noelle Penner,  RN, BSN, CCM [facility] Care Management Community Coordinator Office number [phone]

## 2022-09-03 NOTE — Patient Outreach (Signed)
  Care Coordination   09/03/2022 Name: [name] MRN: [id number] DOB: 1940-04-07   Care Coordination Outreach Attempts:  An unsuccessful telephone outreach was attempted today to offer the patient information about available care coordination services.  Follow Up Plan:  Additional outreach attempts will be made to offer the patient care coordination information and services.   Encounter Outcome:  No Answer   Care Coordination Interventions:  No, not indicated    Arlin Sass L. Noelle Penner, RN, BSN, CCM [facility] Care Management Community Coordinator Office number [phone]

## 2022-09-03 NOTE — Patient Outreach (Signed)
  Care Coordination   Follow Up Visit Note   09/03/2022 Name: [name] MRN: [id number] DOB: 11/19/1940  [name] is a 82 y.o. year old male who sees Luking, [name], MD for primary care. I spoke with  [name]'s daughter, [name] by phone today after [name] left a message for RN CM .  What matters to the patients health and wellness today?  Need toilet & a plumber [name] requested to call RN CM back as she was speaking with someone on the other line  Water pressure slow, leaking and mold under the home. Home "smelling like poop" Gas for car too much to go to [place] to the food bank  Sister works at [employer] & will help on 09/21/22 for their friends and family day.   Ramp needed  Finished with home health services via [facility]  House keeping assistance is needed as she has her daughter and grandson moving in.     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]  General Interventions   General Interventions Discussed/Reviewed General Interventions Reviewed, Communication with  Communication with --  [call to [facility] [place] ([name] [phone] Wednesday-Thursday), call to [facility] [phone], [facility] ([name]) [phone] no answer to request a call to [name]]              SDOH assessments and interventions completed:  Yes  SDOH Interventions Today    Flowsheet Row Most Recent Value  SDOH Interventions   Housing Interventions Other (Comment)  [outreach to several plumbers serving [place]. requestoing a call to [name] Discussed & referred to 211 + the council on aging]        Care Coordination Interventions:  Yes, provided   Follow up plan: Follow up call scheduled  for 09/10/22    Encounter Outcome:  Pt. Visit Completed   [name] L. Noelle Penner, RN, BSN, CCM [facility] Care Management Community Coordinator Office number [phone]

## 2022-09-06 ENCOUNTER — Other Ambulatory Visit: Payer: Self-pay | Admitting: Family Medicine

## 2022-09-10 ENCOUNTER — Ambulatory Visit: Payer: Self-pay | Admitting: *Deleted

## 2022-09-10 ENCOUNTER — Ambulatory Visit: Payer: Medicare Other | Admitting: Family Medicine

## 2022-09-10 VITALS — BP 120/64 | HR 74 | Temp 97.0°F | Ht 73.0 in | Wt 310.0 lb

## 2022-09-10 DIAGNOSIS — I10 Essential (primary) hypertension: Secondary | ICD-10-CM | POA: Diagnosis not present

## 2022-09-10 DIAGNOSIS — I5032 Chronic diastolic (congestive) heart failure: Secondary | ICD-10-CM

## 2022-09-10 DIAGNOSIS — K5150 Left sided colitis without complications: Secondary | ICD-10-CM

## 2022-09-10 DIAGNOSIS — J449 Chronic obstructive pulmonary disease, unspecified: Secondary | ICD-10-CM | POA: Diagnosis not present

## 2022-09-10 DIAGNOSIS — N1831 Chronic kidney disease, stage 3a: Secondary | ICD-10-CM | POA: Diagnosis not present

## 2022-09-10 DIAGNOSIS — Z79899 Other long term (current) drug therapy: Secondary | ICD-10-CM | POA: Diagnosis not present

## 2022-09-10 DIAGNOSIS — R7303 Prediabetes: Secondary | ICD-10-CM | POA: Diagnosis not present

## 2022-09-10 DIAGNOSIS — E785 Hyperlipidemia, unspecified: Secondary | ICD-10-CM | POA: Diagnosis not present

## 2022-09-10 MED ORDER — ISOSORBIDE MONONITRATE ER 30 MG PO TB24: ORAL_TABLET | ORAL | 1 refills | Status: DC

## 2022-09-10 MED ORDER — CARVEDILOL 12.5 MG PO TABS: 12.5000 mg | ORAL_TABLET | Freq: Two times a day (BID) | ORAL | 1 refills | Status: DC

## 2022-09-10 MED ORDER — PRAVASTATIN SODIUM 40 MG PO TABS: ORAL_TABLET | ORAL | 1 refills | Status: DC

## 2022-09-10 MED ORDER — AMLODIPINE BESYLATE 5 MG PO TABS: ORAL_TABLET | ORAL | 1 refills | Status: DC

## 2022-09-10 MED ORDER — TAMSULOSIN HCL 0.4 MG PO CAPS: ORAL_CAPSULE | ORAL | 1 refills | Status: DC

## 2022-09-10 MED ORDER — SPIRONOLACTONE 25 MG PO TABS: 12.5000 mg | ORAL_TABLET | Freq: Every day | ORAL | 1 refills | Status: DC

## 2022-09-10 MED ORDER — DULOXETINE HCL 60 MG PO CPEP: ORAL_CAPSULE | ORAL | 1 refills | Status: DC

## 2022-09-10 MED ORDER — HYDRALAZINE HCL 50 MG PO TABS: 50.0000 mg | ORAL_TABLET | Freq: Three times a day (TID) | ORAL | 1 refills | Status: DC

## 2022-09-10 MED ORDER — TORSEMIDE 20 MG PO TABS: ORAL_TABLET | ORAL | 1 refills | Status: DC

## 2022-09-10 NOTE — Progress Notes (Signed)
   Subjective:    Patient ID: [name], male    DOB: 07/02/1940, 82 y.o.   MRN: [id number]  HPI 3 month follow up  Bad problems sleeping at night  Daughter [name] wants him to be more active   Review of Systems     Objective:   Physical Exam        Assessment & Plan:

## 2022-09-10 NOTE — Patient Outreach (Signed)
  Care Coordination   Follow Up Visit Note   09/10/2022 Name: [name] MRN: [id number] DOB: November 18, 1940  [name] is a 82 y.o. year old male who sees Luking, [name], MD for primary care. I spoke with  Janace Litten by phone today.  What matters to the patients health and wellness today?  Home improvements- Got toilet fixed temporarily. Now has a Nutritional therapist ([facility]- Loraine Leriche, cost $50) Voiced understanding of [employer], [employer]. [place])  Agreed to have RN CM to complete contact information so [name] could receive a call to offer assistance  Spoke [name] about her medical concerns  and her caregiver concerns  congestive Heart Failure (CHF) weight 300-315 lb generally but recently has stayed around 306 lbs no feet swelling but [name] will monitor for abdominal swelling  [name] is aware of need to use extra diuretic if weight increase 3- 5 lbs   [name] wrote down number for [employer]- [facility] Care guide number [phone], RN CM, [facility]   Goals Addressed               This Visit's Progress     Patient Stated     Effective management of CHF/home improvement (pt-stated)   On track     Interventions Today    Flowsheet Row Most Recent Value  Chronic Disease   Chronic disease during today's visit --  [home improvements- plumbing Confirmed outreach from [facility] ([facility]) [phone]]  General Interventions   General Interventions Discussed/Reviewed General Interventions Reviewed, Doctor Visits, Community Resources  Doctor Visits Discussed/Reviewed Doctor Visits Reviewed, PCP  PCP/Specialist Visits Compliance with follow-up visit  Exercise Interventions   Exercise Discussed/Reviewed Exercise Reviewed, Physical Activity, Weight Managment  Physical Activity Discussed/Reviewed Physical Activity Reviewed, Home Exercise Program (HEP)  Weight Management Weight maintenance  Education Interventions    Education Provided --  [[facility] care guide services, [employer]  Provided Verbal Education On [facility], Air traffic controller  Mental Health Interventions   Mental Health Discussed/Reviewed Mental Health Reviewed, Coping Strategies, Anxiety  Nutrition Interventions   Nutrition Discussed/Reviewed Nutrition Reviewed  Safety Interventions   Safety Discussed/Reviewed Safety Discussed, Fall Risk  Advanced Directive Interventions   Advanced Directives Discussed/Reviewed Advanced Directives Discussed, Provided resource for acquiring and filling out documents              SDOH assessments and interventions completed:  No     Care Coordination Interventions:  Yes, provided   Follow up plan: Follow up call scheduled for 09/26/22    Encounter Outcome:  Pt. Visit Completed    L. Noelle Penner, RN, BSN, CCM [facility] Care Management Community Coordinator Office number [phone]

## 2022-09-10 NOTE — Progress Notes (Addendum)
Subjective:    Patient ID: [name], male    DOB: Jun 22, 1940, 82 y.o.   MRN: [id number]  HPI Rodnie presents today for follow-up On multiple medicines He states most days he feels fairly good Sleeps a lot during the day and does not sleep well at night Denies depression issues currently States energy level overall doing okay Patient's appetite is fair Patient is weak overall has limited mobility because of his chronic health issues History hypertension takes his medicine denies trouble History of heart disease on his medicines currently no setbacks recently History of colitis doing well Depression doing well on current medicine Family brings in all of his medicines were reviewed today Labs were ordered patient will go ahead and get these completed  Review of Systems     Objective:   Physical Exam General-in no acute distress Eyes-no discharge Lungs-respiratory rate normal, CTA CV-no murmurs,RRR Extremities skin warm dry no edema Neuro grossly normal Behavior normal, alert   All medications were reviewed today, refills updated, stop vitamin E supplements     Assessment & Plan:  1. Chronic diastolic heart failure ([name]) Reduce hydralazine to 50 mg 3 times daily Continue other medicines as is  2. Essential hypertension Blood pressure when I checked it 110/60 diastolic lower than what I would like to see you reduce hydralazine  3. Chronic obstructive pulmonary disease, unspecified COPD type ([name]) On oxygen  Overall doing well  4. Left sided ulcerative colitis without complication ([name]) Colitis issues stable  5. Stage 3a chronic kidney disease (CKD) ([name]) Await lab work to look at kidney function Follow-up 3 months Patient does benefit from CPAP usage.  He does use CPAP.  He tries to use it every single night.  It is medically necessary for his health.  He does benefit from utilizing it.

## 2022-09-10 NOTE — Patient Instructions (Addendum)
Visit Information  Thank you for taking time to visit with me today. Please don't hesitate to contact me if I can be of assistance to you.   [facility] [employer], [place]. [place]) [address] [place], [place] [address] [phone]  [facility] guide number [phone],  [facility] [phone]    Following are the goals we discussed today:   Goals Addressed               This Visit's Progress     Patient Stated     Effective management of CHF/home improvement (pt-stated)   On track     Interventions Today    Flowsheet Row Most Recent Value  Chronic Disease   Chronic disease during today's visit --  [home improvements- plumbing Confirmed outreach from [facility] ([facility]) [phone]]  General Interventions   General Interventions Discussed/Reviewed General Interventions Reviewed, Doctor Visits, Community Resources  Doctor Visits Discussed/Reviewed Doctor Visits Reviewed, PCP  PCP/Specialist Visits Compliance with follow-up visit  Exercise Interventions   Exercise Discussed/Reviewed Exercise Reviewed, Physical Activity, Weight Managment  Physical Activity Discussed/Reviewed Physical Activity Reviewed, Home Exercise Program (HEP)  Weight Management Weight maintenance  Education Interventions   Education Provided --  [[facility] care guide services, [employer]  Provided Verbal Education On [facility], Air traffic controller  Mental Health Interventions   Mental Health Discussed/Reviewed Mental Health Reviewed, Coping Strategies, Anxiety  Nutrition Interventions   Nutrition Discussed/Reviewed Nutrition Reviewed  Safety Interventions   Safety Discussed/Reviewed Safety Discussed, Fall Risk  Advanced Directive Interventions   Advanced Directives Discussed/Reviewed Advanced Directives Discussed, Provided resource for acquiring and filling out documents              Our  next appointment is by telephone on 09/26/22 at 3:15 pm  Please call the care guide team at [phone] if you need to cancel or reschedule your appointment.   If you are experiencing a Mental Health or Behavioral Health Crisis or need someone to talk to, please call the Suicide and Crisis Lifeline: 988 call the [place] National Suicide Prevention Lifeline: 208-882-9430 or TTY: 3463110553 TTY 505-282-1687) to talk to a trained counselor call 1-800-273-TALK (toll free, 24 hour hotline) call the [facility]: ([phone] call 911   Patient verbalizes understanding of instructions and care plan provided today and agrees to view in MyChart. Active MyChart status and patient understanding of how to access instructions and care plan via MyChart confirmed with patient.     The patient has been provided with contact information for the care management team and has been advised to call with any health related questions or concerns.    L. Noelle Penner, RN, BSN, CCM [facility] Care Management Community Coordinator Office number [phone]

## 2022-09-11 ENCOUNTER — Encounter: Payer: Self-pay | Admitting: Family Medicine

## 2022-09-11 NOTE — Progress Notes (Signed)
Please mail to patient not sure he uses MyChart

## 2022-09-17 DIAGNOSIS — I2510 Atherosclerotic heart disease of native coronary artery without angina pectoris: Secondary | ICD-10-CM | POA: Diagnosis not present

## 2022-09-17 DIAGNOSIS — I509 Heart failure, unspecified: Secondary | ICD-10-CM | POA: Diagnosis not present

## 2022-09-17 DIAGNOSIS — M6281 Muscle weakness (generalized): Secondary | ICD-10-CM | POA: Diagnosis not present

## 2022-09-20 DIAGNOSIS — J441 Chronic obstructive pulmonary disease with (acute) exacerbation: Secondary | ICD-10-CM | POA: Diagnosis not present

## 2022-09-20 DIAGNOSIS — J9601 Acute respiratory failure with hypoxia: Secondary | ICD-10-CM | POA: Diagnosis not present

## 2022-09-20 DIAGNOSIS — R0902 Hypoxemia: Secondary | ICD-10-CM | POA: Diagnosis not present

## 2022-09-25 DIAGNOSIS — G4733 Obstructive sleep apnea (adult) (pediatric): Secondary | ICD-10-CM | POA: Diagnosis not present

## 2022-09-26 ENCOUNTER — Ambulatory Visit: Payer: Self-pay | Admitting: *Deleted

## 2022-09-26 ENCOUNTER — Encounter: Payer: Self-pay | Admitting: *Deleted

## 2022-09-26 ENCOUNTER — Ambulatory Visit: Payer: Self-pay

## 2022-09-26 NOTE — Patient Instructions (Signed)
Visit Information  Thank you for taking time to visit with me today. Please don't hesitate to contact me if I can be of assistance to you.   Following are the goals we discussed today:   Goals Addressed             This Visit's Progress    Receive Assistance Obtaining In-Home Care Services.   On track    Care Coordination Interventions:  Interventions Today    Flowsheet Row Most Recent Value  Chronic Disease Discussed/Reviewed   Chronic disease discussed/reviewed during today's visit Chronic Kidney Disease/End Stage Renal Disease (ESRD)  General Interventions   General Interventions Discussed/Reviewed General Interventions Discussed, General Interventions Reviewed, Durable Medical Equipment (DME), Health Screening, Community Resources, Doctor Visits  Doctor Visits Discussed/Reviewed Doctor Visits Discussed, Doctor Visits Reviewed, Annual Wellness Visits, PCP  Durable Medical Equipment (DME) Bed side commode, BP Cuff, Environmental consultant, Wheelchair, Physicist, medical  PCP/Specialist Visits Compliance with follow-up visit  Exercise Interventions   Exercise Discussed/Reviewed Exercise Discussed, Exercise Reviewed, Weight Managment, Assistive device use and maintanence  Weight Management Weight loss  Education Interventions   Education Provided Provided Verbal Education  Provided Verbal Education On Nutrition, Mental Health/Coping with Illness, Exercise, Medication, When to see the doctor  Mental Health Interventions   Mental Health Discussed/Reviewed Mental Health Discussed, Mental Health Reviewed, Coping Strategies  Nutrition Interventions   Nutrition Discussed/Reviewed Nutrition Discussed, Nutrition Reviewed, Portion sizes, Decreasing sugar intake, Increaing proteins, Decreasing fats, Decreasing salt  Pharmacy Interventions   Pharmacy Dicussed/Reviewed Medication Adherence, Affording Medications  Safety Interventions   Safety Discussed/Reviewed Safety Discussed, Safety  Reviewed, Fall Risk, Home Safety  Home Safety Assistive Devices, Need for home safety assessment, Refer for community resources      Active Listening & Reflection Utilized.  Verbalization of Feelings Encouraged.  Emotional Support Provided. Caregiver Stress & Fatigue Acknowledged. Caregiver Burnout Validated. Problem Solving Interventions Implemented. Task-Centered Solutions Activated.  Solution-Focused Strategies Employed.  Acceptance & Commitment Therapy Indicated. Cognitive Behavioral Therapy Initiated. Client-Centered Therapy Performed. CSW Collaboration with Daughter, [name] to Encourage Continued Administration of Medications, Exactly as Prescribed. CSW Collaboration with Daughter, [name] to Encourage Implementation of Deep Breathing Exercises, Relaxation Techniques, & Mindfulness Meditation Strategies Daily. CSW Collaboration with Daughter, [name] to Confirm that Patient is Now Completely Wheelchair/Bed Bound, No Longer Ambulatory, Requiring Maximum Assistance with All Activities of Daily Living. CSW Collaboration with Daughter, [name] to Confirm Continued Disinterest in Pursuing Higher Level of Care Placement Options for Patient. CSW Collaboration with Daughter, [name] to Confirm That Her Adult Daughter & Grandson Have Recently Moved Into the Home to Help Provide 24 Hour Care & Supervision to Patient. CSW Collaboration with Daughter, [name] to Confirm That Her Adult Daughter & [name], Now Residing with Her & Patient, Are Able to Provide Financial Assistance to Help Pay for Food & Nutritional Services, Rent & Utilities, Housing Repairs & Home Modifications, & Durable Medical Equipment & Supplies. CSW Collaboration with Daughter, [name] to [employer] with [employer], [employer], & Resources of Interest in [place], from List Provided, in An Effort to [employer] of An Acupuncturist Ramp. CSW  Collaboration with Daughter, [name] to [employer] with CSW [phone]), if She Has Questions, Needs Assistance, or If Additional Social Work Needs Are Identified Between Now & Our Next Scheduled Follow-Up Outreach Call.      Our next appointment is by telephone on 10/15/2022 at 9:00 am.  Please call the care guide  team at [phone] if you need to cancel or reschedule your appointment.   If you are experiencing a Mental Health or Behavioral Health Crisis or need someone to talk to, please call the Suicide and Crisis Lifeline: 988 call the [place] National Suicide Prevention Lifeline: 810-457-6277 or TTY: 518-158-1992 TTY 615 216 0592) to talk to a trained counselor call 1-800-273-TALK (toll free, 24 hour hotline) go to [facility] [address] [place] [phone]) call the [facility] Crisis Line: [phone] call 911  Patient verbalizes understanding of instructions and care plan provided today and agrees to view in MyChart. Active MyChart status and patient understanding of how to access instructions and care plan via MyChart confirmed with patient.     Telephone follow up appointment with care management team member scheduled for:  10/15/2022 at 9:00 am.  Danford Bad, BSW, MSW, LCSW  Embedded Practice Social Work Case Manager  [facility], Population Health Direct Dial: [phone]  Fax: [phone] Email: [email or web address]: [email or web address]

## 2022-09-26 NOTE — Patient Outreach (Signed)
  Care Coordination   Initial Visit Note   09/26/2022 Name: CYLUS CARPENITO MRN: [id number] DOB: 1940/04/07  Baze Beevers Ohr is a 82 y.o. year old male who sees Luking, [name], MD for primary care. I spoke with  Ashley Murrain Robertsons daughter [name] by phone today.  What matters to the patients health and wellness today?  Patients daughter is requesting clarification on contacting local resources and competing paperwork for financial assistance.    Goals Addressed             This Visit's Progress    Care Coordination Activities       Interventions Today    Flowsheet Row Most Recent Value  General Interventions   General Interventions Discussed/Reviewed General Interventions Discussed, General Interventions Reviewed, Community Resources  [SW spoke to daughter regarding utility assistance.  Received CIP/LIEAP application and community resources.  Requested direction on completing the form. Discussed Aging Disability & Transit Services for meals on wheels, DSS for Foodstamps.]  Education Interventions   Education Provided Provided Education  Provided Verbal Education On [facility]  [Provided information on food banks and local resources and redirected daughter to the list provided by LCSW.]              SDOH assessments and interventions completed:  No     Care Coordination Interventions:  Yes, provided   Follow up plan: No further intervention required.   Encounter Outcome:  Pt. Visit Completed

## 2022-09-26 NOTE — Patient Outreach (Signed)
Care Coordination   Follow Up Visit Note   09/26/2022  Name: [name] MRN: [id number] DOB: July 12, 1940  [name] is a 82 y.o. year old male who sees Luking, [name], MD for primary care. I spoke with patient's daughter, [name] by phone today.  What matters to the patients health and wellness today?  Receive Assistance Obtaining In-Home Care Services.   Goals Addressed             This Visit's Progress    Receive Assistance Obtaining In-Home Care Services.   On track    Care Coordination Interventions:  Interventions Today    Flowsheet Row Most Recent Value  Chronic Disease Discussed/Reviewed   Chronic disease discussed/reviewed during today's visit Chronic Kidney Disease/End Stage Renal Disease (ESRD)  General Interventions   General Interventions Discussed/Reviewed General Interventions Discussed, General Interventions Reviewed, Durable Medical Equipment (DME), Health Screening, Community Resources, Doctor Visits  Doctor Visits Discussed/Reviewed Doctor Visits Discussed, Doctor Visits Reviewed, Annual Wellness Visits, PCP  Durable Medical Equipment (DME) Bed side commode, BP Cuff, Environmental consultant, Wheelchair, Physicist, medical  PCP/Specialist Visits Compliance with follow-up visit  Exercise Interventions   Exercise Discussed/Reviewed Exercise Discussed, Exercise Reviewed, Weight Managment, Assistive device use and maintanence  Weight Management Weight loss  Education Interventions   Education Provided Provided Verbal Education  Provided Verbal Education On Nutrition, Mental Health/Coping with Illness, Exercise, Medication, When to see the doctor  Mental Health Interventions   Mental Health Discussed/Reviewed Mental Health Discussed, Mental Health Reviewed, Coping Strategies  Nutrition Interventions   Nutrition Discussed/Reviewed Nutrition Discussed, Nutrition Reviewed, Portion sizes, Decreasing sugar intake, Increaing proteins, Decreasing  fats, Decreasing salt  Pharmacy Interventions   Pharmacy Dicussed/Reviewed Medication Adherence, Affording Medications  Safety Interventions   Safety Discussed/Reviewed Safety Discussed, Safety Reviewed, Fall Risk, Home Safety  Home Safety Assistive Devices, Need for home safety assessment, Refer for community resources      Active Listening & Reflection Utilized.  Verbalization of Feelings Encouraged.  Emotional Support Provided. Caregiver Stress & Fatigue Acknowledged. Caregiver Burnout Validated. Problem Solving Interventions Implemented. Task-Centered Solutions Activated.  Solution-Focused Strategies Employed.  Acceptance & Commitment Therapy Indicated. Cognitive Behavioral Therapy Initiated. Client-Centered Therapy Performed. CSW Collaboration with Daughter, [name] to Encourage Continued Administration of Medications, Exactly as Prescribed. CSW Collaboration with Daughter, [name] to Encourage Implementation of Deep Breathing Exercises, Relaxation Techniques, & Mindfulness Meditation Strategies Daily. CSW Collaboration with Daughter, [name] to Confirm that Patient is Now Completely Wheelchair/Bed Bound, No Longer Ambulatory, Requiring Maximum Assistance with All Activities of Daily Living. CSW Collaboration with Daughter, [name] to Confirm Continued Disinterest in Pursuing Higher Level of Care Placement Options for Patient. CSW Collaboration with Daughter, [name] to Confirm That Her Adult Daughter & Grandson Have Recently Moved Into the Home to Help Provide 24 Hour Care & Supervision to Patient. CSW Collaboration with Daughter, [name] to Confirm That Her Adult Daughter & [name], Now Residing with Her & Patient, Are Able to Provide Financial Assistance to Help Pay for Food & Nutritional Services, Rent & Utilities, Housing Repairs & Home Modifications, & Durable Medical Equipment & Supplies. CSW Collaboration with Daughter, [name]  to [employer] with [employer], [employer], & Resources of Interest in [place], from List Provided, in An Effort to [employer] of An Acupuncturist Ramp. CSW Collaboration with Daughter, [name] to [employer] with CSW [phone]), if She Has Questions, Needs Assistance, or If Additional Social Work Needs Are Identified  Between Now & Our Next Scheduled Follow-Up Outreach Call.      SDOH assessments and interventions completed:  Yes.  Care Coordination Interventions:  Yes, provided.   Follow up plan: Follow up call scheduled for 10/15/2022 at 9:00 am.  Encounter Outcome:  Pt. Visit Completed.   Danford Bad, BSW, MSW, Printmaker Social Work Case Set designer Health  [facility], Population Health Direct Dial: [phone]  Fax: [phone] Email: [email or web address]: [email or web address]

## 2022-09-26 NOTE — Patient Outreach (Signed)
  Care Coordination   Follow Up Visit Note   05/10/2023 updated note for 09/26/22 Name: [name] MRN: [id number] DOB: [birth year]  [name] is a 82 y.o. year old male who sees Luking, [name], MD for primary care. I spoke with  Janace Litten by phone today.  What matters to the patients health and wellness today?  Electric bill over $1100; must pay $200 to have Social Security pay. Conference with daughter and L Clemons BSW to discuss form that was provided by Ruby Cola., LCSW. Working with [employer] (aging, disability and transit services) & Meals on wheels only for the days he is unable to drive and to apply for food stamps at DSS or go to the local food banks. The two-page LEAP (low income energy Assistance Program) form to be taken to Department of Social Services (DSS)  Need get leak fix and change out filter spent $200 Provided [facility] care guide's number [phone].   Goals Addressed               This Visit's Progress     Patient Stated     Effective management of CHF/home improvement (pt-stated)        Interventions Today    Flowsheet Row Most Recent Value  Chronic Disease   Chronic disease during today's visit Other, Chronic Obstructive Pulmonary Disease (COPD), Congestive Heart Failure (CHF), Hypertension (HTN), Chronic Kidney Disease/End Stage Renal Disease (ESRD)  [electric bill,]  General Interventions   General Interventions Discussed/Reviewed [employer], General Interventions Reviewed, Communication with  Communication with Social Work  Environmental consultant with BSW L Clemons]  Education Interventions   Education Provided Provided Education  [employer] form ADTS, Meals on wheels,]  Provided Engineer, petroleum On [employer]  Mental Health Interventions   Mental Health Discussed/Reviewed Refer to Social Work for resources, Freight forwarder, Mental Health Reviewed  Refer to Social Work for resources regarding Other  [LEAP- Low energy assistance  program]  Nutrition Interventions   Nutrition Discussed/Reviewed Nutrition Reviewed, Adding fruits and vegetables, Decreasing salt, Fluid intake, Decreasing fats  Pharmacy Interventions   Pharmacy Dicussed/Reviewed Pharmacy Topics Reviewed, Affording Medications  Safety Interventions   Safety Discussed/Reviewed Safety Reviewed, Fall Risk, Home Safety  Home Safety Assistive Devices              SDOH assessments and interventions completed:  Yes  SDOH Interventions Today    Flowsheet Row Most Recent Value  SDOH Interventions   Utilities Interventions Community Resources Provided, Other (Comment)  [active with LCSW & care guide confernce with BSW & encouraged to call care guide back]          Care Coordination Interventions:  Yes, provided   Follow up plan: No further intervention required.   Encounter Outcome:  Pt. Visit Completed   Ninamarie Keel L. Noelle Penner, RN, BSN, CCM [facility] Care Management Community Coordinator Office number [phone]

## 2022-09-26 NOTE — Patient Instructions (Addendum)
Visit Information  Thank you for taking time to visit with me today. Please don't hesitate to contact me if I can be of assistance to you.   Following are the goals we discussed today:  Patients daughter will complete CIP/LIEAP application and submit to DSS. Patients daughter will follow up with local resources for food and financial assistance. Patients daughter will contact [employer] for Meals on Wheels.   If you are experiencing a Mental Health or Behavioral Health Crisis or need someone to talk to, please call 911  Patient verbalizes understanding of instructions and care plan provided today and agrees to view in MyChart. Active MyChart status and patient understanding of how to access instructions and care plan via MyChart confirmed with patient.     No further follow up required: Patient will follow up with LCSW 10/15/22 at 9am.  Lysle Morales, BSW Social Worker [facility] Care Management  [phone]

## 2022-09-27 ENCOUNTER — Other Ambulatory Visit (HOSPITAL_BASED_OUTPATIENT_CLINIC_OR_DEPARTMENT_OTHER): Payer: Self-pay

## 2022-10-01 ENCOUNTER — Other Ambulatory Visit: Payer: Self-pay | Admitting: Family Medicine

## 2022-10-15 ENCOUNTER — Encounter: Payer: Self-pay | Admitting: *Deleted

## 2022-10-15 ENCOUNTER — Ambulatory Visit: Payer: Self-pay | Admitting: *Deleted

## 2022-10-15 NOTE — Patient Outreach (Signed)
Care Coordination   Follow Up Visit Note   10/15/2022  Name: [name] MRN: [id number] DOB: 15-May-1940  [place] [name] is a 82 y.o. year old male who sees Luking, Chris Coup, MD for primary care. I spoke with patient's daughter, [name] by phone today.  What matters to the patients health and wellness today?  Receive Assistance Obtaining In-Home Care Services.    Goals Addressed             This Visit's Progress    Receive Assistance Obtaining In-Home Care Services.   On track    Care Coordination Interventions:  Interventions Today    Flowsheet Row Most Recent Value  Chronic Disease Discussed/Reviewed   Chronic disease discussed/reviewed during today's visit Chronic Kidney Disease/End Stage Renal Disease (ESRD)  General Interventions   General Interventions Discussed/Reviewed General Interventions Discussed, General Interventions Reviewed, Durable Medical Equipment (DME), Health Screening, Community Resources, Doctor Visits  Doctor Visits Discussed/Reviewed Doctor Visits Discussed, Doctor Visits Reviewed, Annual Wellness Visits, PCP  Durable Medical Equipment (DME) Bed side commode, BP Cuff, Environmental consultant, Wheelchair, Physicist, medical  PCP/Specialist Visits Compliance with follow-up visit  Exercise Interventions   Exercise Discussed/Reviewed Exercise Discussed, Exercise Reviewed, Weight Managment, Assistive device use and maintanence  Weight Management Weight loss  Education Interventions   Education Provided Provided Verbal Education  Provided Verbal Education On Nutrition, Mental Health/Coping with Illness, Exercise, Medication, When to see the doctor  Mental Health Interventions   Mental Health Discussed/Reviewed Mental Health Discussed, Mental Health Reviewed, Coping Strategies  Nutrition Interventions   Nutrition Discussed/Reviewed Nutrition Discussed, Nutrition Reviewed, Portion sizes, Decreasing sugar intake, Increaing proteins, Decreasing  fats, Decreasing salt  Pharmacy Interventions   Pharmacy Dicussed/Reviewed Medication Adherence, Affording Medications  Safety Interventions   Safety Discussed/Reviewed Safety Discussed, Safety Reviewed, Fall Risk, Home Safety  Home Safety Assistive Devices, Need for home safety assessment, Refer for community resources      Active Listening & Reflection Utilized.  Verbalization of Feelings Encouraged.  Emotional Support Provided. Caregiver Stress & Fatigue Acknowledged. Caregiver Burnout Validated. Problem Solving Interventions Implemented. Task-Centered Solutions Activated.  Solution-Focused Strategies Employed.  Acceptance & Commitment Therapy Indicated. Cognitive Behavioral Therapy Initiated. Client-Centered Therapy Performed. CSW Collaboration with Daughter, [name] to Confirm That Her Adult Daughter & [name] Continue to Reside in The Home with Her & Patient, to Assist with Providing 24 Hour Care & Supervision, In Addition to Assistance with Activities of Daily Living. CSW Collaboration with Daughter, [name] to [employer] Underwood with [employer], [employer], & Resources of Interest in [place], from List Provided, in An Effort to [employer] of An Acupuncturist Ramp. CSW Collaboration with Daughter, [name] to Encourage Continued Engagement with [name], BSW with [facility], to Receive Assistance with Completion of Applications for Crisis Intervention Program, As Well As Low Income Energy Assistance Program, & Submission to The [facility] Department of Health & [employer] [phone]), for Processing. CSW Collaboration with Daughter, [name] to Encourage Continued Engagement with [name], BSW with [facility], to Receive Assistance with Obtaining Prepared Meal Delivery Services through Meals on Wheels, with Aging, Disability, & Transit Services of [place] [phone]). CSW Collaboration with Daughter, [name] to Encourage Continued Engagement with [name], BSW with [facility], to Receive Assistance with Completion of Application for Food Stamps & Submission to The [facility] of Social Services [phone]), for Processing. CSW Collaboration with Daughter, [name] to  Copy (I.e [name], Stage manager, & Soup [name]) of Interest in [place], from List Provided. CSW Collaboration with Daughter, [name] to Encourage Attendance at Follow-Up Appointment for Patient with [name], Podiatrist with [facility] at [facility] (# [id number].), Scheduled on 10/17/2022 at 3:15 PM. CSW Collaboration with Daughter, [name] to [employer] Underwood with CSW [phone]), if She Has Questions, Needs Assistance, or If Additional Social Work Needs Are Identified Between Now & Our Next Follow-Up Outreach Call, Scheduled on 11/05/2022 at 9:00 AM. CSW Collaboration with Daughter, [name] to Encourage Attendance at Follow-Up Appointment for Patient with [name], Endocrinologist with [facility] [phone]), Scheduled on 11/22/2022 at 10:00 AM.      SDOH assessments and interventions completed:  Yes.  Care Coordination Interventions:  Yes, provided.   Follow up plan: Follow up call scheduled for 11/05/2022 at 9:00 am.  Encounter Outcome:  Patient Visit Completed.   [name], BSW, MSW, Printmaker Social Work Case Set designer Health  [facility], Population Health Direct Dial: [phone]  Fax: [phone] Email: [email or web address] Website: [email or web address]

## 2022-10-15 NOTE — Patient Instructions (Signed)
Visit Information  Thank you for taking time to visit with me today. Please don't hesitate to contact me if I can be of assistance to you.   Following are the goals we discussed today:   Goals Addressed             This Visit's Progress    Receive Assistance Obtaining In-Home Care Services.   On track    Care Coordination Interventions:  Interventions Today    Flowsheet Row Most Recent Value  Chronic Disease Discussed/Reviewed   Chronic disease discussed/reviewed during today's visit Chronic Kidney Disease/End Stage Renal Disease (ESRD)  General Interventions   General Interventions Discussed/Reviewed General Interventions Discussed, General Interventions Reviewed, Durable Medical Equipment (DME), Health Screening, Community Resources, Doctor Visits  Doctor Visits Discussed/Reviewed Doctor Visits Discussed, Doctor Visits Reviewed, Annual Wellness Visits, PCP  Durable Medical Equipment (DME) Bed side commode, BP Cuff, Environmental consultant, Wheelchair, Physicist, medical  PCP/Specialist Visits Compliance with follow-up visit  Exercise Interventions   Exercise Discussed/Reviewed Exercise Discussed, Exercise Reviewed, Weight Managment, Assistive device use and maintanence  Weight Management Weight loss  Education Interventions   Education Provided Provided Verbal Education  Provided Verbal Education On Nutrition, Mental Health/Coping with Illness, Exercise, Medication, When to see the doctor  Mental Health Interventions   Mental Health Discussed/Reviewed Mental Health Discussed, Mental Health Reviewed, Coping Strategies  Nutrition Interventions   Nutrition Discussed/Reviewed Nutrition Discussed, Nutrition Reviewed, Portion sizes, Decreasing sugar intake, Increaing proteins, Decreasing fats, Decreasing salt  Pharmacy Interventions   Pharmacy Dicussed/Reviewed Medication Adherence, Affording Medications  Safety Interventions   Safety Discussed/Reviewed Safety Discussed, Safety  Reviewed, Fall Risk, Home Safety  Home Safety Assistive Devices, Need for home safety assessment, Refer for community resources      Active Listening & Reflection Utilized.  Verbalization of Feelings Encouraged.  Emotional Support Provided. Caregiver Stress & Fatigue Acknowledged. Caregiver Burnout Validated. Problem Solving Interventions Implemented. Task-Centered Solutions Activated.  Solution-Focused Strategies Employed.  Acceptance & Commitment Therapy Indicated. Cognitive Behavioral Therapy Initiated. Client-Centered Therapy Performed. CSW Collaboration with Daughter, [name] to Confirm That Her Adult Daughter & [name] Continue to Reside in The Home with Her & Patient, to Assist with Providing 24 Hour Care & Supervision, In Addition to Assistance with Activities of Daily Living. CSW Collaboration with Daughter, [name] to [employer] with [employer], [employer], & Resources of Interest in [place], from List Provided, in An Effort to [employer] of An Acupuncturist Ramp. CSW Collaboration with Daughter, [name] to Encourage Continued Engagement with Lysle Morales, BSW with [facility], to Receive Assistance with Completion of Applications for Crisis Intervention Program, As Well As Low Income Energy Assistance Program, & Submission to The [facility] Department of Health & [employer] [phone]), for Processing. CSW Collaboration with Daughter, [name] to Encourage Continued Engagement with Lysle Morales, BSW with [facility], to Receive Assistance with Obtaining Prepared Meal Delivery Services through Meals on Wheels, with Aging, Disability, & Transit Services of [place] [phone]). CSW Collaboration with Daughter, [name] to Encourage Continued Engagement with Lysle Morales, BSW with [facility], to Receive Assistance with Completion of  Application for Food Stamps & Submission to The [facility] of Social Services [phone]), for Processing. CSW Collaboration with Daughter, [name] to [employer] of [employer] (I.e [employer], Stage manager, & Soup [place]) of Interest in [place], from List Provided. CSW Collaboration with Daughter, [name] to Encourage Attendance at Follow-Up  Appointment for Patient with Dr. Helane Gunther, Podiatrist with [facility] at [facility] (# [id number].), Scheduled on 10/17/2022 at 3:15 PM. CSW Collaboration with Daughter, [name] to [employer] with CSW [phone]), if She Has Questions, Needs Assistance, or If Additional Social Work Needs Are Identified Between Now & Our Next Follow-Up Outreach Call, Scheduled on 11/05/2022 at 9:00 AM. CSW Collaboration with Daughter, [name] to Encourage Attendance at Follow-Up Appointment for Patient with Dr. Purcell Nails, Endocrinologist with [facility] [phone]), Scheduled on 11/22/2022 at 10:00 AM.      Our next appointment is by telephone on 11/05/2022 at 9:00 am.  Please call the care guide team at [phone] if you need to cancel or reschedule your appointment.   If you are experiencing a Mental Health or Behavioral Health Crisis or need someone to talk to, please call the Suicide and Crisis Lifeline: 988 call the [place] National Suicide Prevention Lifeline: 4083425732 or TTY: (906)463-8466 TTY 229-738-0937) to talk to a trained counselor call 1-800-273-TALK (toll free, 24 hour hotline) go to [facility] [address] [place] [phone]) call the [facility] Crisis Line: [phone] call 911  Patient verbalizes understanding of instructions and care plan provided today and agrees to view in MyChart. Active MyChart status and patient understanding of how  to access instructions and care plan via MyChart confirmed with patient.     Telephone follow up appointment with care management team member scheduled for:  11/05/2022 at 9:00 am.  Danford Bad, BSW, MSW, LCSW  Embedded Practice Social Work Case Manager  [facility], Population Health Direct Dial: [phone]  Fax: [phone] Email: [email or web address] Website: [email or web address]

## 2022-10-17 ENCOUNTER — Encounter: Payer: Self-pay | Admitting: Podiatry

## 2022-10-17 ENCOUNTER — Ambulatory Visit: Payer: Medicare Other | Admitting: Podiatry

## 2022-10-17 DIAGNOSIS — M79676 Pain in unspecified toe(s): Secondary | ICD-10-CM

## 2022-10-17 DIAGNOSIS — N1831 Chronic kidney disease, stage 3a: Secondary | ICD-10-CM

## 2022-10-17 DIAGNOSIS — B351 Tinea unguium: Secondary | ICD-10-CM | POA: Diagnosis not present

## 2022-10-17 NOTE — Progress Notes (Signed)
This patient presents to the office with chief complaint of long thick painful nails.  Patient says the nails are painful walking and wearing shoes.  This patient is unable to self treat.  This patient is unable to trim his nails since he is unable to reach his nails.  he presents to the office for preventative foot care services.  General Appearance  Alert, conversant and in no acute stress.  Vascular  Dorsalis pedis and posterior tibial  pulses are  not palpable due to swelling   bilaterally.  Capillary return is within normal limits  bilaterally. Temperature is within normal limits  bilaterally.  Neurologic  Senn-Weinstein monofilament wire test within normal limits  bilaterally. Muscle power within normal limits bilaterally.  Nails Thick disfigured discolored nails with subungual debris  from hallux to fifth toes bilaterally. No evidence of bacterial infection or drainage bilaterally.  Orthopedic  No limitations of motion  feet .  No crepitus or effusions noted.  No bony pathology or digital deformities noted.  Skin  normotropic skin with no porokeratosis noted bilaterally.  No signs of infections or ulcers noted.     Onychomycosis  Nails  B/L.  Pain in right toes  Pain in left toes  Debridement of nails both feet followed trimming the nails with dremel tool.    RTC 3 months.   Helane Gunther DPM

## 2022-10-21 DIAGNOSIS — J9601 Acute respiratory failure with hypoxia: Secondary | ICD-10-CM | POA: Diagnosis not present

## 2022-10-21 DIAGNOSIS — R0902 Hypoxemia: Secondary | ICD-10-CM | POA: Diagnosis not present

## 2022-10-21 DIAGNOSIS — J441 Chronic obstructive pulmonary disease with (acute) exacerbation: Secondary | ICD-10-CM | POA: Diagnosis not present

## 2022-11-05 ENCOUNTER — Ambulatory Visit: Payer: Self-pay | Admitting: *Deleted

## 2022-11-05 NOTE — Patient Instructions (Addendum)
Visit Information  Thank you for taking time to visit with me today. Please don't hesitate to contact me if I can be of assistance to you.   Following are the goals we discussed today:   Goals Addressed             This Visit's Progress    Receive Assistance Obtaining In-Home Care Services.   On track    Care Coordination Interventions:  Interventions Today    Flowsheet Row Most Recent Value  Chronic Disease Discussed/Reviewed   Chronic disease discussed/reviewed during today's visit Chronic Kidney Disease/End Stage Renal Disease (ESRD)  General Interventions   General Interventions Discussed/Reviewed General Interventions Discussed, General Interventions Reviewed, Durable Medical Equipment (DME), Health Screening, Community Resources, Doctor Visits  Doctor Visits Discussed/Reviewed Doctor Visits Discussed, Doctor Visits Reviewed, Annual Wellness Visits, PCP  Durable Medical Equipment (DME) Bed side commode, BP Cuff, Environmental consultant, Wheelchair, Physicist, medical  PCP/Specialist Visits Compliance with follow-up visit  Exercise Interventions   Exercise Discussed/Reviewed Exercise Discussed, Exercise Reviewed, Weight Managment, Assistive device use and maintanence  Weight Management Weight loss  Education Interventions   Education Provided Provided Verbal Education  Provided Verbal Education On Nutrition, Mental Health/Coping with Illness, Exercise, Medication, When to see the doctor  Mental Health Interventions   Mental Health Discussed/Reviewed Mental Health Discussed, Mental Health Reviewed, Coping Strategies  Nutrition Interventions   Nutrition Discussed/Reviewed Nutrition Discussed, Nutrition Reviewed, Portion sizes, Decreasing sugar intake, Increaing proteins, Decreasing fats, Decreasing salt  Pharmacy Interventions   Pharmacy Dicussed/Reviewed Medication Adherence, Affording Medications  Safety Interventions   Safety Discussed/Reviewed Safety Discussed, Safety  Reviewed, Fall Risk, Home Safety  Home Safety Assistive Devices, Need for home safety assessment, Refer for community resources      Active Listening & Reflection Utilized.  Verbalization of Feelings Encouraged.  Emotional Support Provided. Feelings of Caregiver Burnout, Stress, & Fatigue Acknowledged. Symptoms of Anxiety Validated. Problem Solving Interventions Indicated. Task-Centered Solutions Engaged.  Solution-Focused Strategies Enacted.  Acceptance & Commitment Therapy Performed. Cognitive Behavioral Therapy Conducted. Client-Centered Therapy Initiated. Encouraged Increased Level of Activity & Exercise, as Tolerated. Encouraged Routine Engagement in Activities of Interest, Inside & Outside the Home. Encouraged Administration of Medications, Exactly as Prescribed. Encouraged Daily Implementation of Deep Breathing Exercises, Relaxation Techniques, & Mindfulness Meditation Strategies. CSW Collaboration with Daughter, [name] to Confirm Name on Waiting List to Receive Prepared Meal Delivery Services, 5 Days Per Week, through Aging, Disability, & Transit Services of [place] [phone]).  CSW Collaboration with Daughter, [name] to Confirm Name on Waiting List to [facility], through Aging, Disability, & Transit Services of [place] [phone]). CSW Collaboration with Daughter, [name] to Confirm Continued 24 Hour Care & Supervision in the Home, As Well As Maximum Assistance with Activities of Daily Living.  CSW Collaboration with Daughter, [name] to [employer] with [employer], [employer], & Resources of Interest in [place], from List Provided, in An Effort to [employer] of An Acupuncturist Ramp. CSW Collaboration with Daughter, [name] to Encourage Completion of Application for [employer], As Well As Low Income Energy Assistance Program, & Submission to  The [facility] Department of Health & [employer] [phone]), for Processing, in An Effort to [employer]. CSW Collaboration with Daughter, [name] to Encourage Completion of Application for Food Stamps & Submission to The [facility] of Social Services [phone]), for Processing, in An Effort to Obtain Food & Nutritional Services.  CSW Collaboration with Daughter, [name] to [employer] of [employer] (I.e [employer], Stage manager, & [employer]) of Interest in [place], from List Provided, in An Effort to [employer] & [employer]. CSW Collaboration with Daughter, [name] to Encourage Attendance at Follow-Up Appointment for Patient with Dr. Purcell Nails, Endocrinologist with [facility] [phone]), Scheduled on 11/22/2022 at 10:00 AM. CSW Collaboration with Daughter, [name] to [employer] with CSW [phone]), if She Has Questions, Needs Assistance, or If Additional Social Work Needs Are Identified Between Now & Our Next Follow-Up Outreach Call, Scheduled on 12/04/2022 at 2:00 PM. CSW Collaboration with Daughter, [name] to Encourage Attendance at Follow-Up Appointment for Patient with Dr. Helane Gunther, Podiatrist with [facility] at [facility] [phone]), Scheduled on 01/16/2023 at 3:45 PM      Our next appointment is by telephone on 12/04/2022 at 2:00 pm.  Please call the care guide team at [phone] if you need to cancel or reschedule your appointment.   If you are experiencing a Mental Health or Behavioral Health Crisis or need someone to talk to, please call the Suicide and Crisis Lifeline: 988 call the [place] National Suicide Prevention Lifeline: 6360204516 or TTY: 9717124955 TTY [phone]) to talk to a trained counselor call 1-800-273-TALK (toll free, 24 hour hotline) go  to [facility] [address], [place] [phone]) call the [facility] Crisis Line: [phone] call 911  Patient verbalizes understanding of instructions and care plan provided today and agrees to view in MyChart. Active MyChart status and patient understanding of how to access instructions and care plan via MyChart confirmed with patient.     Telephone follow up appointment with care management team member scheduled for:  12/04/2022 at 2:00 pm.  Danford Bad, BSW, MSW, LCSW  Embedded Practice Social Work Case Manager  [facility], Population Health Direct Dial: [phone]  Fax: [phone] Email: [email or web address]: [email or web address]

## 2022-11-05 NOTE — Patient Outreach (Addendum)
Care Coordination   Follow Up Visit Note   11/05/2022  Name: [name] MRN: [id number] DOB: April 20, 1940  [name] is a 82 y.o. year old male who sees Luking, [name], MD for primary care. I spoke with patient's daughter, [name] by phone today.  What matters to the patients health and wellness today?  Receive Assistance Obtaining In-Home Care Services.    Goals Addressed             This Visit's Progress    Receive Assistance Obtaining In-Home Care Services.   On track    Care Coordination Interventions:  Interventions Today    Flowsheet Row Most Recent Value  Chronic Disease Discussed/Reviewed   Chronic disease discussed/reviewed during today's visit Chronic Kidney Disease/End Stage Renal Disease (ESRD)  General Interventions   General Interventions Discussed/Reviewed General Interventions Discussed, General Interventions Reviewed, Durable Medical Equipment (DME), Health Screening, Community Resources, Doctor Visits  Doctor Visits Discussed/Reviewed Doctor Visits Discussed, Doctor Visits Reviewed, Annual Wellness Visits, PCP  Durable Medical Equipment (DME) Bed side commode, BP Cuff, Environmental consultant, Wheelchair, Physicist, medical  PCP/Specialist Visits Compliance with follow-up visit  Exercise Interventions   Exercise Discussed/Reviewed Exercise Discussed, Exercise Reviewed, Weight Managment, Assistive device use and maintanence  Weight Management Weight loss  Education Interventions   Education Provided Provided Verbal Education  Provided Verbal Education On Nutrition, Mental Health/Coping with Illness, Exercise, Medication, When to see the doctor  Mental Health Interventions   Mental Health Discussed/Reviewed Mental Health Discussed, Mental Health Reviewed, Coping Strategies  Nutrition Interventions   Nutrition Discussed/Reviewed Nutrition Discussed, Nutrition Reviewed, Portion sizes, Decreasing sugar intake, Increaing proteins, Decreasing  fats, Decreasing salt  Pharmacy Interventions   Pharmacy Dicussed/Reviewed Medication Adherence, Affording Medications  Safety Interventions   Safety Discussed/Reviewed Safety Discussed, Safety Reviewed, Fall Risk, Home Safety  Home Safety Assistive Devices, Need for home safety assessment, Refer for community resources      Active Listening & Reflection Utilized.  Verbalization of Feelings Encouraged.  Emotional Support Provided. Feelings of Caregiver Burnout, Stress, & Fatigue Acknowledged. Symptoms of Anxiety Validated. Problem Solving Interventions Indicated. Task-Centered Solutions Engaged.  Solution-Focused Strategies Enacted.  Acceptance & Commitment Therapy Performed. Cognitive Behavioral Therapy Conducted. Client-Centered Therapy Initiated. Encouraged Increased Level of Activity & Exercise, as Tolerated. Encouraged Routine Engagement in Activities of Interest, Inside & Outside the Home. Encouraged Administration of Medications, Exactly as Prescribed. Encouraged Daily Implementation of Deep Breathing Exercises, Relaxation Techniques, & Mindfulness Meditation Strategies. CSW Collaboration with Daughter, [name] to Confirm Name on Waiting List to Receive Prepared Meal Delivery Services, 5 Days Per Week, through Aging, Disability, & Transit Services of [place] [phone]).  CSW Collaboration with Daughter, [name] to Confirm Name on Waiting List to [facility], through Aging, Disability, & Transit Services of [place] [phone]). CSW Collaboration with Daughter, [name] to Confirm Continued 24 Hour Care & Supervision in the Home, As Well As Maximum Assistance with Activities of Daily Living.  CSW Collaboration with Daughter, [name] to [employer] with [employer], [employer], & Resources of Interest in [place], from List Provided, in An Effort to [employer] of An Chiropodist Ramp. CSW Collaboration with Daughter, [name] to Encourage Completion of Application for [employer], As Well As Low Income Energy Assistance Program, & Submission to [facility] Department of Health & [employer] [phone]), for Processing, in An Effort to [employer]. CSW Collaboration with Daughter, Chris Fail  Underwood to [employer] Completion of Application for [employer] & Submission to [facility] of Social Services [phone]), for Processing, in An Effort to Obtain Food & Nutritional Services. CSW Collaboration with Daughter, [name] to [employer] of [employer] (I[employer], Stage manager, & [employer]) of Interest in [place], from List Provided, in An Effort to [employer] & [employer]. CSW Collaboration with Daughter, [name] to Encourage Attendance at Follow-Up Appointment for Patient with Dr. Purcell Nails, Endocrinologist with [facility] [phone]), Scheduled on 11/22/2022 at 10:00 AM. CSW Collaboration with Daughter, [name] to [employer] with CSW [phone]), if She Has Questions, Needs Assistance, or If Additional Social Work Needs Are Identified Between Now & Our Next Follow-Up Outreach Call, Scheduled on 12/04/2022 at 2:00 PM. CSW Collaboration with Daughter, [name] to Encourage Attendance at Follow-Up Appointment for Patient with Dr. Helane Gunther, Podiatrist with [facility] at [facility] [phone]), Scheduled on 01/16/2023 at 3:45 PM      SDOH assessments and interventions completed:  Yes.  Care Coordination Interventions:  Yes, provided.   Follow up plan: Follow up call scheduled for 12/04/2022 at 2:00 pm.  Encounter Outcome:  Patient Visit Completed.   [name], BSW, MSW, Printmaker Social Work Case Sports administrator Health  [facility], Population Health Direct Dial: [phone]  Fax: [phone] Email: [email or web address]

## 2022-11-18 ENCOUNTER — Other Ambulatory Visit: Payer: Self-pay | Admitting: Family Medicine

## 2022-11-20 DIAGNOSIS — R0902 Hypoxemia: Secondary | ICD-10-CM | POA: Diagnosis not present

## 2022-11-20 DIAGNOSIS — J9601 Acute respiratory failure with hypoxia: Secondary | ICD-10-CM | POA: Diagnosis not present

## 2022-11-20 DIAGNOSIS — J441 Chronic obstructive pulmonary disease with (acute) exacerbation: Secondary | ICD-10-CM | POA: Diagnosis not present

## 2022-11-22 ENCOUNTER — Ambulatory Visit: Payer: Medicare Other | Admitting: "Endocrinology

## 2022-11-25 ENCOUNTER — Other Ambulatory Visit: Payer: Self-pay | Admitting: Family Medicine

## 2022-11-26 ENCOUNTER — Telehealth: Payer: Self-pay | Admitting: Family Medicine

## 2022-11-26 MED ORDER — PRAVASTATIN SODIUM 40 MG PO TABS: ORAL_TABLET | ORAL | 0 refills | Status: DC

## 2022-11-26 NOTE — Telephone Encounter (Signed)
Refill on  pravastatin (PRAVACHOL) 40 MG tablet  send to [facility] [place]

## 2022-11-26 NOTE — Telephone Encounter (Signed)
Received via fax Rx request: Prescription sent electronically to pharmacy  

## 2022-11-30 ENCOUNTER — Other Ambulatory Visit: Payer: Self-pay | Admitting: Family Medicine

## 2022-12-02 ENCOUNTER — Other Ambulatory Visit: Payer: Self-pay

## 2022-12-02 ENCOUNTER — Emergency Department (HOSPITAL_COMMUNITY): Payer: Medicare Other

## 2022-12-02 ENCOUNTER — Encounter (HOSPITAL_COMMUNITY): Payer: Self-pay

## 2022-12-02 ENCOUNTER — Inpatient Hospital Stay (HOSPITAL_COMMUNITY)
Admission: EM | Admit: 2022-12-02 | Discharge: 2022-12-06 | DRG: 871 | Disposition: A | Discharge status: Home Health | Payer: Medicare Other | Attending: Internal Medicine | Admitting: Internal Medicine

## 2022-12-02 DIAGNOSIS — Z87891 Personal history of nicotine dependence: Secondary | ICD-10-CM | POA: Diagnosis not present

## 2022-12-02 DIAGNOSIS — K8020 Calculus of gallbladder without cholecystitis without obstruction: Secondary | ICD-10-CM | POA: Diagnosis not present

## 2022-12-02 DIAGNOSIS — I10 Essential (primary) hypertension: Secondary | ICD-10-CM | POA: Diagnosis present

## 2022-12-02 DIAGNOSIS — F0393 Unspecified dementia, unspecified severity, with mood disturbance: Secondary | ICD-10-CM | POA: Diagnosis present

## 2022-12-02 DIAGNOSIS — K5792 Diverticulitis of intestine, part unspecified, without perforation or abscess without bleeding: Secondary | ICD-10-CM

## 2022-12-02 DIAGNOSIS — A419 Sepsis, unspecified organism: Principal | ICD-10-CM

## 2022-12-02 DIAGNOSIS — Z9981 Dependence on supplemental oxygen: Secondary | ICD-10-CM

## 2022-12-02 DIAGNOSIS — Z860100 Personal history of colon polyps, unspecified: Secondary | ICD-10-CM

## 2022-12-02 DIAGNOSIS — E872 Acidosis, unspecified: Secondary | ICD-10-CM | POA: Diagnosis not present

## 2022-12-02 DIAGNOSIS — I5032 Chronic diastolic (congestive) heart failure: Secondary | ICD-10-CM | POA: Diagnosis not present

## 2022-12-02 DIAGNOSIS — J9611 Chronic respiratory failure with hypoxia: Secondary | ICD-10-CM | POA: Diagnosis not present

## 2022-12-02 DIAGNOSIS — Z1152 Encounter for screening for COVID-19: Secondary | ICD-10-CM

## 2022-12-02 DIAGNOSIS — N400 Enlarged prostate without lower urinary tract symptoms: Secondary | ICD-10-CM | POA: Diagnosis present

## 2022-12-02 DIAGNOSIS — K5730 Diverticulosis of large intestine without perforation or abscess without bleeding: Secondary | ICD-10-CM | POA: Diagnosis not present

## 2022-12-02 DIAGNOSIS — N1831 Chronic kidney disease, stage 3a: Secondary | ICD-10-CM | POA: Diagnosis present

## 2022-12-02 DIAGNOSIS — R0902 Hypoxemia: Secondary | ICD-10-CM | POA: Diagnosis not present

## 2022-12-02 DIAGNOSIS — K5732 Diverticulitis of large intestine without perforation or abscess without bleeding: Secondary | ICD-10-CM | POA: Diagnosis present

## 2022-12-02 DIAGNOSIS — J439 Emphysema, unspecified: Secondary | ICD-10-CM | POA: Diagnosis not present

## 2022-12-02 DIAGNOSIS — G4733 Obstructive sleep apnea (adult) (pediatric): Secondary | ICD-10-CM | POA: Diagnosis not present

## 2022-12-02 DIAGNOSIS — Z79899 Other long term (current) drug therapy: Secondary | ICD-10-CM

## 2022-12-02 DIAGNOSIS — K5190 Ulcerative colitis, unspecified, without complications: Secondary | ICD-10-CM | POA: Diagnosis present

## 2022-12-02 DIAGNOSIS — R7401 Elevation of levels of liver transaminase levels: Secondary | ICD-10-CM | POA: Diagnosis present

## 2022-12-02 DIAGNOSIS — N200 Calculus of kidney: Secondary | ICD-10-CM | POA: Diagnosis not present

## 2022-12-02 DIAGNOSIS — Z823 Family history of stroke: Secondary | ICD-10-CM

## 2022-12-02 DIAGNOSIS — K219 Gastro-esophageal reflux disease without esophagitis: Secondary | ICD-10-CM | POA: Diagnosis not present

## 2022-12-02 DIAGNOSIS — Z833 Family history of diabetes mellitus: Secondary | ICD-10-CM

## 2022-12-02 DIAGNOSIS — I130 Hypertensive heart and chronic kidney disease with heart failure and stage 1 through stage 4 chronic kidney disease, or unspecified chronic kidney disease: Secondary | ICD-10-CM | POA: Diagnosis not present

## 2022-12-02 DIAGNOSIS — Z66 Do not resuscitate: Secondary | ICD-10-CM | POA: Diagnosis not present

## 2022-12-02 DIAGNOSIS — F32A Depression, unspecified: Secondary | ICD-10-CM | POA: Diagnosis present

## 2022-12-02 DIAGNOSIS — N179 Acute kidney failure, unspecified: Secondary | ICD-10-CM | POA: Diagnosis not present

## 2022-12-02 DIAGNOSIS — I959 Hypotension, unspecified: Secondary | ICD-10-CM | POA: Diagnosis not present

## 2022-12-02 DIAGNOSIS — R6520 Severe sepsis without septic shock: Secondary | ICD-10-CM | POA: Diagnosis present

## 2022-12-02 DIAGNOSIS — A411 Sepsis due to other specified staphylococcus: Secondary | ICD-10-CM | POA: Diagnosis not present

## 2022-12-02 DIAGNOSIS — J189 Pneumonia, unspecified organism: Secondary | ICD-10-CM | POA: Diagnosis present

## 2022-12-02 DIAGNOSIS — Z801 Family history of malignant neoplasm of trachea, bronchus and lung: Secondary | ICD-10-CM

## 2022-12-02 DIAGNOSIS — I700 Atherosclerosis of aorta: Secondary | ICD-10-CM | POA: Diagnosis not present

## 2022-12-02 DIAGNOSIS — R918 Other nonspecific abnormal finding of lung field: Secondary | ICD-10-CM | POA: Diagnosis not present

## 2022-12-02 DIAGNOSIS — G9341 Metabolic encephalopathy: Principal | ICD-10-CM

## 2022-12-02 DIAGNOSIS — E785 Hyperlipidemia, unspecified: Secondary | ICD-10-CM | POA: Diagnosis present

## 2022-12-02 DIAGNOSIS — Z955 Presence of coronary angioplasty implant and graft: Secondary | ICD-10-CM

## 2022-12-02 DIAGNOSIS — Z8711 Personal history of peptic ulcer disease: Secondary | ICD-10-CM

## 2022-12-02 DIAGNOSIS — E66813 Obesity, class 3: Secondary | ICD-10-CM | POA: Diagnosis present

## 2022-12-02 DIAGNOSIS — R531 Weakness: Secondary | ICD-10-CM | POA: Diagnosis not present

## 2022-12-02 DIAGNOSIS — Z8249 Family history of ischemic heart disease and other diseases of the circulatory system: Secondary | ICD-10-CM

## 2022-12-02 DIAGNOSIS — J449 Chronic obstructive pulmonary disease, unspecified: Secondary | ICD-10-CM | POA: Diagnosis present

## 2022-12-02 DIAGNOSIS — R0689 Other abnormalities of breathing: Secondary | ICD-10-CM | POA: Diagnosis not present

## 2022-12-02 DIAGNOSIS — Z83438 Family history of other disorder of lipoprotein metabolism and other lipidemia: Secondary | ICD-10-CM

## 2022-12-02 DIAGNOSIS — Z841 Family history of disorders of kidney and ureter: Secondary | ICD-10-CM

## 2022-12-02 DIAGNOSIS — I2510 Atherosclerotic heart disease of native coronary artery without angina pectoris: Secondary | ICD-10-CM | POA: Diagnosis present

## 2022-12-02 DIAGNOSIS — Z7982 Long term (current) use of aspirin: Secondary | ICD-10-CM

## 2022-12-02 DIAGNOSIS — Z6841 Body Mass Index (BMI) 40.0 and over, adult: Secondary | ICD-10-CM

## 2022-12-02 DIAGNOSIS — J4489 Other specified chronic obstructive pulmonary disease: Secondary | ICD-10-CM | POA: Diagnosis present

## 2022-12-02 LAB — CBC WITH DIFFERENTIAL/PLATELET
Abs Immature Granulocytes: 0.05 10*3/uL (ref 0.00–0.07)
Basophils Absolute: 0.1 10*3/uL (ref 0.0–0.1)
Basophils Relative: 1 %
Eosinophils Absolute: 0.1 10*3/uL (ref 0.0–0.5)
Eosinophils Relative: 1 %
HCT: 41 % (ref 39.0–52.0)
Hemoglobin: 13.2 g/dL (ref 13.0–17.0)
Immature Granulocytes: 1 %
Lymphocytes Relative: 17 %
Lymphs Abs: 1.9 10*3/uL (ref 0.7–4.0)
MCH: 30.1 pg (ref 26.0–34.0)
MCHC: 32.2 g/dL (ref 30.0–36.0)
MCV: 93.4 fL (ref 80.0–100.0)
Monocytes Absolute: 0.7 10*3/uL (ref 0.1–1.0)
Monocytes Relative: 6 %
Neutro Abs: 8.1 10*3/uL — ABNORMAL HIGH (ref 1.7–7.7)
Neutrophils Relative %: 74 %
Platelets: 179 10*3/uL (ref 150–400)
RBC: 4.39 MIL/uL (ref 4.22–5.81)
RDW: 13.5 % (ref 11.5–15.5)
WBC: 10.8 10*3/uL — ABNORMAL HIGH (ref 4.0–10.5)
nRBC: 0 % (ref 0.0–0.2)

## 2022-12-02 LAB — COMPREHENSIVE METABOLIC PANEL
ALT: 55 U/L — ABNORMAL HIGH (ref 0–44)
AST: 75 U/L — ABNORMAL HIGH (ref 15–41)
Albumin: 3.6 g/dL (ref 3.5–5.0)
Alkaline Phosphatase: 70 U/L (ref 38–126)
Anion gap: 12 (ref 5–15)
BUN: 20 mg/dL (ref 8–23)
CO2: 24 mmol/L (ref 22–32)
Calcium: 10.4 mg/dL — ABNORMAL HIGH (ref 8.9–10.3)
Chloride: 101 mmol/L (ref 98–111)
Creatinine, Ser: 1.53 mg/dL — ABNORMAL HIGH (ref 0.61–1.24)
GFR, Estimated: 45 mL/min — ABNORMAL LOW (ref >60)
Glucose, Bld: 166 mg/dL — ABNORMAL HIGH (ref 70–99)
Potassium: 4.1 mmol/L (ref 3.5–5.1)
Sodium: 137 mmol/L (ref 135–145)
Total Bilirubin: 0.3 mg/dL (ref 0.3–1.2)
Total Protein: 7.5 g/dL (ref 6.5–8.1)

## 2022-12-02 LAB — PROTIME-INR
INR: 1 (ref 0.8–1.2)
Prothrombin Time: 13.4 s (ref 11.4–15.2)

## 2022-12-02 LAB — I-STAT CG4 LACTIC ACID, ED: Lactic Acid, Venous: 2 mmol/L (ref 0.5–1.9)

## 2022-12-02 LAB — URINALYSIS, W/ REFLEX TO CULTURE (INFECTION SUSPECTED)
Bacteria, UA: NONE SEEN
Bilirubin Urine: NEGATIVE
Glucose, UA: NEGATIVE mg/dL
Ketones, ur: NEGATIVE mg/dL
Nitrite: NEGATIVE
Protein, ur: NEGATIVE mg/dL
Specific Gravity, Urine: 1.008 (ref 1.005–1.030)
pH: 6 (ref 5.0–8.0)

## 2022-12-02 MED ORDER — ACETAMINOPHEN 650 MG RE SUPP: 650.0000 mg | Freq: Once | RECTAL | Status: DC

## 2022-12-02 MED ORDER — ACETAMINOPHEN 500 MG PO TABS
1000.0000 mg | ORAL_TABLET | Freq: Once | ORAL | Status: DC
  Filled 2022-12-02: qty 2

## 2022-12-02 NOTE — ED Provider Notes (Signed)
[name] [facility] Provider Note  CSN: [id number] Arrival date & time: 12/02/22 2242  Chief Complaint(s) Weakness (Pt BIB [facility] for generalized weakness that EMS stated started today. Daughter noticed the pt had urinated in his chair which is not normal for him. Pt is lethargic, oriented to year and location but not month. Pt normally a/ox4. )  HPI [name] is a 82 y.o. male with a past medical history listed below who presents to the emergency department with 1 day of generalized fatigue.  Patient was complaining of chills.  No overt fevers.  No coughing.  Family reports that patient did have some dry heaving earlier this evening.  He was not previously complaining of abdominal pain.  No dysuria.  No diarrhea.  No known sick contacts.  Patient does have a history of COPD and is on 4 L nasal cannula at baseline.  The history is provided by the EMS personnel and a relative.    Past Medical History Past Medical History:  Diagnosis Date   Ankylosing spondylitis ([name]) 11/07/2018   Asthma    BPH (benign prostatic hyperplasia)    CAD (coronary artery disease) 01/1995   CHF (congestive heart failure) ([name])    Clostridium difficile colitis 04/2005   Colitis 2011   COPD (chronic obstructive pulmonary disease) ([name])    Depression    Diverticulosis    DJD (degenerative joint disease)    Gastric ulcer 04/17/10   Three 5mm gastric ulcers, H.pylori serologies were negative   GERD (gastroesophageal reflux disease)    History of kidney stones    Hyperlipidemia    Hypertension    Idiopathic chronic inflammatory bowel disease 05/18/2010   left-sided UC   Kidney stone    Morbid obesity ([name]) 03/12/2018   Obstructive sleep apnea    on Cpap   Reflux 02/1995   S/P endoscopy 07/24/10   retained gastric contents, benign bx   Patient Active Problem List   Diagnosis Date Noted   Acute diverticulitis 12/03/2022   Elevated transaminase level 12/03/2022   Osteopenia  05/23/2022   Vitamin D deficiency 05/23/2022   Other hyperparathyroidism ([name]) 05/08/2022   Stage 3a chronic kidney disease (CKD) ([name]) 01/19/2022   Myocardial injury 01/19/2022   Hypokalemia 01/19/2022   Hypertensive crisis 01/18/2022   Community acquired pneumonia of bilateral lower lobes and right middle lobe due to Streptoccocus pneumoniae 01/17/2022   Depression, major, single episode, moderate ([name]) 02/20/2021   Hematochezia 01/26/2021   Syncope 10/29/2020   Head concussion 10/29/2020   Bilateral hand fractures 10/29/2020   Gastroesophageal reflux disease without esophagitis 07/19/2020   Status post cervical spinal fusion 07/19/2020   Cervical spondylosis 03/07/2020   DNR (do not resuscitate) 08/17/2019   Shortness of breath 04/09/2019   COPD (chronic obstructive pulmonary disease) ([name]) 04/09/2019   Ulcerative rectosigmoiditis with rectal bleeding ([name]) 04/09/2019   Chronic respiratory failure with hypoxia ([name]) 04/09/2019   Obesity with body mass index (BMI) of 30.0 to 39.9 04/09/2019   Acute on chronic respiratory failure with hypoxia and hypercapnia ([name]) 12/23/2018   Acute on chronic hypoxic respiratory failure ([name]) 12/21/2018   Ankylosing spondylitis ([name]) 11/07/2018   Disseminated idiopathic skeletal hyperostosis 10/22/2018   Neck pain 10/22/2018   Rectal bleeding 08/26/2018   Chronic heart failure with preserved ejection fraction (HFpEF) ([name]) 03/27/2018   Exertional dyspnea 03/15/2018   Morbid obesity ([name]) 03/12/2018   History of carpal tunnel surgery of left wrist with unlar head resection 01/17/17 01/24/2017  Closed fracture of distal radius and ulna, left, with malunion, subsequent encounter    Carpal tunnel syndrome of left wrist    Fracture, [place], left, closed 11/29/2016   Hx of skin cancer, basal cell 11/30/2015   Erectile dysfunction 06/06/2015   Iron deficiency 08/18/2014   Fecal incontinence 02/10/2014   Essential hypertension 01/09/2013    Dyslipidemia, goal LDL below 70 01/09/2013   Chest pain at rest 12/14/2012   CAD (coronary artery disease) 12/14/2012   Hiatal hernia 12/14/2012   GERD (gastroesophageal reflux disease) 12/14/2012   Prediabetes 12/09/2012   BPH (benign prostatic hyperplasia) 11/18/2012   RUQ pain 09/04/2012   Gallstones 05/09/2012   OSA treated with BiPAP 04/30/2012   Ulcerative colitis ([name]) 04/29/2012   Ulcerative colitis, left sided ([name]) 12/26/2011   Diarrhea 10/18/2010   Home Medication(s) Prior to Admission medications   Medication Sig Start Date End Date Taking? Authorizing Provider  acetaminophen (TYLENOL) 500 MG tablet Take 2 tablets (1,000 mg total) by mouth every 6 (six) hours as needed for headache (pain). 11/24/20  Yes Luking, [name], MD  albuterol (VENTOLIN HFA) 108 (90 Base) MCG/ACT inhaler Inhale 2 puffs into the lungs every 6 (six) hours as needed for wheezing or shortness of breath. 05/21/22  Yes Babs Sciara, MD  amLODipine (NORVASC) 5 MG tablet Take one tablet po daily 09/10/22  Yes Luking, Scott A, MD  aspirin EC 81 MG tablet Take 81 mg by mouth daily. Swallow whole.   Yes [provider]  carvedilol (COREG) 12.5 MG tablet Take 1 tablet (12.5 mg total) by mouth 2 (two) times daily with a meal. 09/10/22  Yes Luking, Scott A, MD  DULoxetine (CYMBALTA) 60 MG capsule TAKE 1 CAPSULE BY MOUTH DAILY 09/10/22  Yes Luking, Scott A, MD  hydrALAZINE (APRESOLINE) 50 MG tablet TAKE 1 TABLET(50 MG) BY MOUTH THREE TIMES DAILY 11/30/22  Yes Luking, Scott A, MD  isosorbide mononitrate (IMDUR) 30 MG 24 hr tablet TAKE 3 TABLETS(90 MG) BY MOUTH DAILY 11/28/22  Yes Luking, Scott A, MD  nitroGLYCERIN (NITROSTAT) 0.4 MG SL tablet ONE TABLET UNDER TONGUE AS NEEDED FOR CHEST PAIN EVERY 5 MINUTES AS DIRECTED Patient taking differently: Place 0.4 mg under the tongue every 5 (five) minutes as needed for chest pain. 10/14/20  Yes Runell Gess, MD  pravastatin (PRAVACHOL) 40 MG tablet 1 qd 11/26/22  Yes  Luking, [name], MD  spironolactone (ALDACTONE) 25 MG tablet Take 0.5 tablets (12.5 mg total) by mouth daily. 09/10/22  Yes Babs Sciara, MD  torsemide (DEMADEX) 20 MG tablet 1 qam and may take 2 pill every day prn if weight gain greater than 3 pounds in a week Patient taking differently: Take 20 mg by mouth daily. May take 2 tablets every day as needed if weight gain greater than 3 pounds in a week 09/10/22  Yes Luking, [name], MD  Vitamin D, Ergocalciferol, (DRISDOL) 1.25 MG (50000 UNIT) CAPS capsule TAKE 1 CAPSULE BY MOUTH EVERY 7 DAYS Patient taking differently: Take 50,000 Units by mouth every Monday. 11/19/22  Yes Babs Sciara, MD  tamsulosin (FLOMAX) 0.4 MG CAPS capsule 1 qhs Patient not taking: Reported on 12/03/2022 09/10/22   Babs Sciara, MD  Allergies Patient has no known allergies.  Review of Systems Review of Systems As noted in HPI  Physical Exam Vital Signs  I have reviewed the triage vital signs BP (!) 144/46   Pulse 73   Temp 98.2 F (36.8 C) (Oral)   Resp (!) 21   Ht 6\' 1"  (1.854 m)   Wt (!) 140 kg   SpO2 96%   BMI 40.72 kg/m   Physical Exam Vitals reviewed.  Constitutional:      General: He is not in acute distress.    Appearance: He is well-developed. He is not diaphoretic.  HENT:     Head: Normocephalic and atraumatic.     Nose: Nose normal.  Eyes:     General: No scleral icterus.       Right eye: No discharge.        Left eye: No discharge.     Conjunctiva/sclera: Conjunctivae normal.     Pupils: Pupils are equal, round, and reactive to light.  Cardiovascular:     Rate and Rhythm: Regular rhythm.     Heart sounds: No murmur heard.    No friction rub. No gallop.  Pulmonary:     Effort: Pulmonary effort is normal. No respiratory distress.     Breath sounds: Normal breath sounds. No stridor. No rales.  Abdominal:      General: There is no distension.     Palpations: Abdomen is soft.     Tenderness: There is abdominal tenderness in the periumbilical area and left lower quadrant.  Musculoskeletal:        General: No tenderness.     Cervical back: Normal range of motion and neck supple.  Skin:    General: Skin is warm and dry.     Findings: No erythema or rash.  Neurological:     Mental Status: He is lethargic.     ED Results and Treatments Labs (all labs ordered are listed, but only abnormal results are displayed) Labs Reviewed  COMPREHENSIVE METABOLIC PANEL - Abnormal; Notable for the following components:      Result Value   Glucose, Bld 166 (*)    Creatinine, Ser 1.53 (*)    Calcium 10.4 (*)    AST 75 (*)    ALT 55 (*)    GFR, Estimated 45 (*)    All other components within normal limits  CBC WITH DIFFERENTIAL/PLATELET - Abnormal; Notable for the following components:   WBC 10.8 (*)    Neutro Abs 8.1 (*)    All other components within normal limits  URINALYSIS, W/ REFLEX TO CULTURE (INFECTION SUSPECTED) - Abnormal; Notable for the following components:   Color, Urine STRAW (*)    Hgb urine dipstick SMALL (*)    Leukocytes,Ua SMALL (*)    All other components within normal limits  I-STAT CG4 LACTIC ACID, ED - Abnormal; Notable for the following components:   Lactic Acid, Venous 2.0 (*)    All other components within normal limits  RESP PANEL BY RT-PCR (RSV, FLU A&B, COVID)  RVPGX2  CULTURE, BLOOD (ROUTINE X 2)  CULTURE, BLOOD (ROUTINE X 2) W REFLEX TO ID PANEL  PROTIME-INR  CBC  COMPREHENSIVE METABOLIC PANEL  BRAIN NATRIURETIC PEPTIDE  I-STAT CG4 LACTIC ACID, ED  EKG  EKG Interpretation Date/Time:    Ventricular Rate:    PR Interval:    QRS Duration:    QT Interval:    QTC Calculation:   R Axis:      Text Interpretation:         Radiology CT  ABDOMEN PELVIS W CONTRAST  Result Date: 12/03/2022 CLINICAL DATA:  Left lower quadrant abdominal pain. EXAM: CT ABDOMEN AND PELVIS WITH CONTRAST TECHNIQUE: Multidetector CT imaging of the abdomen and pelvis was performed using the standard protocol following bolus administration of intravenous contrast. RADIATION DOSE REDUCTION: This exam was performed according to the departmental dose-optimization program which includes automated exposure control, adjustment of the mA and/or kV according to patient size and/or use of iterative reconstruction technique. CONTRAST:  75mL OMNIPAQUE IOHEXOL 350 MG/ML SOLN COMPARISON:  01/24/2022. FINDINGS: Lower chest: The heart is enlarged and there is no pericardial effusion. Multi-vessel coronary artery calcifications are seen. Patchy airspace disease is present in the lower lobes bilaterally. Hepatobiliary: No focal liver abnormality is seen. Stones are present within the gallbladder. No biliary ductal dilatation is seen. Pancreas: Unremarkable. No pancreatic ductal dilatation or surrounding inflammatory changes. Spleen: Normal in size without focal abnormality. Adrenals/Urinary Tract: The adrenal glands are within normal limits. A cyst is present in the left kidney. Additional hypodensities are present in the kidneys bilaterally which are too small to further characterize. Renal calculi are present bilaterally. There is prominence of the collecting system on the left which is unchanged from the previous exam. No ureteral calculus or obstructive uropathy is seen. The bladder is unremarkable. Stomach/Bowel: Stomach is within normal limits. Appendix appears normal. No bowel obstruction, free air or pneumatosis is seen. Scattered diverticula are present along the descending colon with minimal associated fat stranding. Vascular/Lymphatic: Aortic atherosclerosis. Nonspecific prominent lymph nodes are noted in the retroperitoneum. Reproductive: Prostate is unremarkable. Other: No  abdominopelvic ascites. A fat containing umbilical hernia is present. Musculoskeletal: Degenerative changes are present in the thoracolumbar spine. There is partial ankylosis of the sacroiliac joints bilaterally. No acute osseous abnormality. IMPRESSION: 1. Few scattered diverticula along the descending colon with subtle fat stranding, possible diverticulitis. 2. Patchy airspace disease at the lung bases, possible atelectasis or infiltrate. 3. Cholelithiasis. 4. Bilateral nephrolithiasis. 5. Aortic atherosclerosis. Electronically Signed   By: Thornell Sartorius M.D.   On: 12/03/2022 01:57   DG Chest 2 View  Result Date: 12/02/2022 CLINICAL DATA:  Suspected Sepsis EXAM: CHEST - 2 VIEW COMPARISON:  Chest x-ray 02/13/2022, CT chest 01/16/2022 FINDINGS: The heart and mediastinal contours are unchanged. Aortic calcification. Chronic coarsened interstitial markings. No focal consolidation. No pulmonary edema. Possible trace bilateral pleural effusions. No pneumothorax. No acute osseous abnormality. IMPRESSION: 1. Possible trace bilateral pleural effusions 2. Aortic Atherosclerosis (ICD10-I70.0) and Emphysema (ICD10-J43.9). Electronically Signed   By: Tish Frederickson M.D.   On: 12/02/2022 23:47    Medications Ordered in ED Medications  sodium chloride 0.9 % bolus 1,000 mL (1,000 mLs Intravenous New Bag/Given 12/03/22 0020)  acetaminophen (TYLENOL) tablet 650 mg (has no administration in time range)  cefTRIAXone (ROCEPHIN) 2 g in sodium chloride 0.9 % 100 mL IVPB (has no administration in time range)  metroNIDAZOLE (FLAGYL) IVPB 500 mg (has no administration in time range)  aspirin EC tablet 81 mg (has no administration in time range)  amLODipine (NORVASC) tablet 5 mg (has no administration in time range)  carvedilol (COREG) tablet 12.5 mg (has no administration in time range)  hydrALAZINE (APRESOLINE) tablet 50 mg (has no administration  in time range)  isosorbide mononitrate (IMDUR) 24 hr tablet 90 mg (has no  administration in time range)  spironolactone (ALDACTONE) tablet 12.5 mg (has no administration in time range)  DULoxetine (CYMBALTA) DR capsule 60 mg (has no administration in time range)  albuterol (VENTOLIN HFA) 108 (90 Base) MCG/ACT inhaler 2 puff (has no administration in time range)  enoxaparin (LOVENOX) injection 40 mg (has no administration in time range)  acetaminophen (TYLENOL) tablet 650 mg (650 mg Oral Given 12/03/22 0025)  cefTRIAXone (ROCEPHIN) 2 g in sodium chloride 0.9 % 100 mL IVPB (0 g Intravenous Stopped 12/03/22 0220)  metroNIDAZOLE (FLAGYL) IVPB 500 mg (0 mg Intravenous Stopped 12/03/22 0255)  iohexol (OMNIPAQUE) 350 MG/ML injection 75 mL (75 mLs Intravenous Contrast Given 12/03/22 0137)   Procedures .Critical Care  Performed by: Nira Conn, MD Authorized by: Nira Conn, MD   Critical care provider statement:    Critical care time (minutes):  44   Critical care was necessary to treat or prevent imminent or life-threatening deterioration of the following conditions:  Sepsis   Critical care was time spent personally by me on the following activities:  Development of treatment plan with patient or surrogate, discussions with consultants, evaluation of patient's response to treatment, examination of patient, ordering and review of laboratory studies, ordering and review of radiographic studies, ordering and performing treatments and interventions, pulse oximetry, re-evaluation of patient's condition and review of old charts   Care discussed with: admitting provider     (including critical care time) Medical Decision Making / ED Course   Medical Decision Making Amount and/or Complexity of Data Reviewed Labs: ordered. Decision-making details documented in ED Course. Radiology: ordered and independent interpretation performed. Decision-making details documented in ED Course.  Risk OTC drugs. Prescription drug management. Decision regarding  hospitalization.     Clinical Course as of 12/03/22 7510  [name] Dec 02, 2022  2330 Noted to be febrile. Likely Infectious etiology. No obvious source. Septic work up started [PC]    Clinical Course User Index [PC] Lourdes Manning, Amadeo Garnet, MD   Workup notable for likely early onset diverticulitis. Chest x-ray without evidence of pneumonia, pneumothorax, pulmonary edema pleural effusions.  CT scan did reveal evidence of atelectasis versus infiltrate in the bases. CBC with leukocytosis.  Initial lactic acid of 2. Code sepsis was initiated patient started on empiric antibiotics.  Rest of the workup reassuring.  Negative UA.  COVID/influenza negative.  Admitted to medicine for further workup and management.  Final Clinical Impression(s) / ED Diagnoses Final diagnoses:  Sepsis with encephalopathy without septic shock, due to unspecified organism [facility])  Diverticulitis    This chart was dictated using voice recognition software.  Despite best efforts to proofread,  errors can occur which can change the documentation meaning.    Nira Conn, MD 12/03/22 [phone]

## 2022-12-02 NOTE — ED Notes (Signed)
Patient transported to X-ray 

## 2022-12-02 NOTE — ED Notes (Signed)
Informed MD of failed swallow screen.

## 2022-12-03 ENCOUNTER — Emergency Department (HOSPITAL_COMMUNITY): Payer: Medicare Other

## 2022-12-03 DIAGNOSIS — K8020 Calculus of gallbladder without cholecystitis without obstruction: Secondary | ICD-10-CM | POA: Diagnosis not present

## 2022-12-03 DIAGNOSIS — R059 Cough, unspecified: Secondary | ICD-10-CM | POA: Diagnosis not present

## 2022-12-03 DIAGNOSIS — K5792 Diverticulitis of intestine, part unspecified, without perforation or abscess without bleeding: Secondary | ICD-10-CM | POA: Diagnosis not present

## 2022-12-03 DIAGNOSIS — G4733 Obstructive sleep apnea (adult) (pediatric): Secondary | ICD-10-CM | POA: Diagnosis present

## 2022-12-03 DIAGNOSIS — K5730 Diverticulosis of large intestine without perforation or abscess without bleeding: Secondary | ICD-10-CM | POA: Diagnosis not present

## 2022-12-03 DIAGNOSIS — J4489 Other specified chronic obstructive pulmonary disease: Secondary | ICD-10-CM | POA: Diagnosis not present

## 2022-12-03 DIAGNOSIS — K219 Gastro-esophageal reflux disease without esophagitis: Secondary | ICD-10-CM | POA: Diagnosis present

## 2022-12-03 DIAGNOSIS — J189 Pneumonia, unspecified organism: Secondary | ICD-10-CM | POA: Diagnosis present

## 2022-12-03 DIAGNOSIS — N179 Acute kidney failure, unspecified: Secondary | ICD-10-CM | POA: Diagnosis present

## 2022-12-03 DIAGNOSIS — K5732 Diverticulitis of large intestine without perforation or abscess without bleeding: Secondary | ICD-10-CM | POA: Diagnosis present

## 2022-12-03 DIAGNOSIS — Z66 Do not resuscitate: Secondary | ICD-10-CM | POA: Diagnosis not present

## 2022-12-03 DIAGNOSIS — Z9981 Dependence on supplemental oxygen: Secondary | ICD-10-CM | POA: Diagnosis not present

## 2022-12-03 DIAGNOSIS — J9611 Chronic respiratory failure with hypoxia: Secondary | ICD-10-CM | POA: Diagnosis present

## 2022-12-03 DIAGNOSIS — I700 Atherosclerosis of aorta: Secondary | ICD-10-CM | POA: Diagnosis not present

## 2022-12-03 DIAGNOSIS — Z6841 Body Mass Index (BMI) 40.0 and over, adult: Secondary | ICD-10-CM | POA: Diagnosis not present

## 2022-12-03 DIAGNOSIS — I517 Cardiomegaly: Secondary | ICD-10-CM | POA: Diagnosis not present

## 2022-12-03 DIAGNOSIS — N200 Calculus of kidney: Secondary | ICD-10-CM | POA: Diagnosis not present

## 2022-12-03 DIAGNOSIS — K5190 Ulcerative colitis, unspecified, without complications: Secondary | ICD-10-CM | POA: Diagnosis present

## 2022-12-03 DIAGNOSIS — Z87891 Personal history of nicotine dependence: Secondary | ICD-10-CM | POA: Diagnosis not present

## 2022-12-03 DIAGNOSIS — A411 Sepsis due to other specified staphylococcus: Secondary | ICD-10-CM | POA: Diagnosis not present

## 2022-12-03 DIAGNOSIS — F32A Depression, unspecified: Secondary | ICD-10-CM | POA: Diagnosis present

## 2022-12-03 DIAGNOSIS — E872 Acidosis, unspecified: Secondary | ICD-10-CM | POA: Diagnosis not present

## 2022-12-03 DIAGNOSIS — Z1152 Encounter for screening for COVID-19: Secondary | ICD-10-CM | POA: Diagnosis not present

## 2022-12-03 DIAGNOSIS — I5032 Chronic diastolic (congestive) heart failure: Secondary | ICD-10-CM | POA: Diagnosis present

## 2022-12-03 DIAGNOSIS — N1831 Chronic kidney disease, stage 3a: Secondary | ICD-10-CM | POA: Diagnosis not present

## 2022-12-03 DIAGNOSIS — R7401 Elevation of levels of liver transaminase levels: Secondary | ICD-10-CM | POA: Diagnosis present

## 2022-12-03 DIAGNOSIS — N400 Enlarged prostate without lower urinary tract symptoms: Secondary | ICD-10-CM | POA: Diagnosis present

## 2022-12-03 DIAGNOSIS — I130 Hypertensive heart and chronic kidney disease with heart failure and stage 1 through stage 4 chronic kidney disease, or unspecified chronic kidney disease: Secondary | ICD-10-CM | POA: Diagnosis not present

## 2022-12-03 DIAGNOSIS — R918 Other nonspecific abnormal finding of lung field: Secondary | ICD-10-CM | POA: Diagnosis not present

## 2022-12-03 DIAGNOSIS — R6520 Severe sepsis without septic shock: Secondary | ICD-10-CM | POA: Diagnosis present

## 2022-12-03 DIAGNOSIS — E785 Hyperlipidemia, unspecified: Secondary | ICD-10-CM | POA: Diagnosis present

## 2022-12-03 DIAGNOSIS — F0393 Unspecified dementia, unspecified severity, with mood disturbance: Secondary | ICD-10-CM | POA: Diagnosis present

## 2022-12-03 LAB — CBC
HCT: 36.3 % — ABNORMAL LOW (ref 39.0–52.0)
Hemoglobin: 11.9 g/dL — ABNORMAL LOW (ref 13.0–17.0)
MCH: 30.5 pg (ref 26.0–34.0)
MCHC: 32.8 g/dL (ref 30.0–36.0)
MCV: 93.1 fL (ref 80.0–100.0)
Platelets: 167 10*3/uL (ref 150–400)
RBC: 3.9 MIL/uL — ABNORMAL LOW (ref 4.22–5.81)
RDW: 13.7 % (ref 11.5–15.5)
WBC: 10.7 10*3/uL — ABNORMAL HIGH (ref 4.0–10.5)
nRBC: 0 % (ref 0.0–0.2)

## 2022-12-03 LAB — COMPREHENSIVE METABOLIC PANEL
ALT: 43 U/L (ref 0–44)
AST: 50 U/L — ABNORMAL HIGH (ref 15–41)
Albumin: 3 g/dL — ABNORMAL LOW (ref 3.5–5.0)
Alkaline Phosphatase: 57 U/L (ref 38–126)
Anion gap: 7 (ref 5–15)
BUN: 19 mg/dL (ref 8–23)
CO2: 24 mmol/L (ref 22–32)
Calcium: 9.2 mg/dL (ref 8.9–10.3)
Chloride: 104 mmol/L (ref 98–111)
Creatinine, Ser: 1.49 mg/dL — ABNORMAL HIGH (ref 0.61–1.24)
GFR, Estimated: 46 mL/min — ABNORMAL LOW (ref >60)
Glucose, Bld: 146 mg/dL — ABNORMAL HIGH (ref 70–99)
Potassium: 3.6 mmol/L (ref 3.5–5.1)
Sodium: 135 mmol/L (ref 135–145)
Total Bilirubin: 0.4 mg/dL (ref 0.3–1.2)
Total Protein: 6.5 g/dL (ref 6.5–8.1)

## 2022-12-03 LAB — BLOOD CULTURE ID PANEL (REFLEXED) - BCID2

## 2022-12-03 LAB — I-STAT CG4 LACTIC ACID, ED: Lactic Acid, Venous: 1.6 mmol/L (ref 0.5–1.9)

## 2022-12-03 LAB — RESP PANEL BY RT-PCR (RSV, FLU A&B, COVID)  RVPGX2
Influenza A by PCR: NEGATIVE
Influenza B by PCR: NEGATIVE
Resp Syncytial Virus by PCR: NEGATIVE
SARS Coronavirus 2 by RT PCR: NEGATIVE

## 2022-12-03 LAB — BRAIN NATRIURETIC PEPTIDE: B Natriuretic Peptide: 125.7 pg/mL — ABNORMAL HIGH (ref 0.0–100.0)

## 2022-12-03 MED ORDER — ENOXAPARIN SODIUM 60 MG/0.6ML IJ SOSY: 60.0000 mg | PREFILLED_SYRINGE | INTRAMUSCULAR | Status: DC

## 2022-12-03 MED ORDER — ASPIRIN 81 MG PO TBEC
81.0000 mg | DELAYED_RELEASE_TABLET | Freq: Every day | ORAL | Status: DC
  Administered 2022-12-03 – 2022-12-06 (×4): 81 mg via ORAL
  Filled 2022-12-03 (×4): qty 1

## 2022-12-03 MED ORDER — SPIRONOLACTONE 12.5 MG HALF TABLET
12.5000 mg | ORAL_TABLET | Freq: Every day | ORAL | Status: DC
  Administered 2022-12-03 – 2022-12-04 (×2): 12.5 mg via ORAL
  Filled 2022-12-03 (×3): qty 1

## 2022-12-03 MED ORDER — SODIUM CHLORIDE 0.9 % IV BOLUS (SEPSIS)
1000.0000 mL | Freq: Once | INTRAVENOUS | Status: AC
  Administered 2022-12-03: 1000 mL via INTRAVENOUS

## 2022-12-03 MED ORDER — DULOXETINE HCL 30 MG PO CPEP
60.0000 mg | ORAL_CAPSULE | Freq: Every day | ORAL | Status: DC
  Administered 2022-12-03 – 2022-12-06 (×4): 60 mg via ORAL
  Filled 2022-12-03 (×4): qty 2

## 2022-12-03 MED ORDER — ACETAMINOPHEN 325 MG PO TABS
650.0000 mg | ORAL_TABLET | Freq: Once | ORAL | Status: AC
  Administered 2022-12-03: 650 mg via ORAL
  Filled 2022-12-03: qty 2

## 2022-12-03 MED ORDER — AMLODIPINE BESYLATE 5 MG PO TABS
5.0000 mg | ORAL_TABLET | Freq: Every day | ORAL | Status: DC
  Administered 2022-12-03 – 2022-12-06 (×4): 5 mg via ORAL
  Filled 2022-12-03 (×4): qty 1

## 2022-12-03 MED ORDER — ACETAMINOPHEN 325 MG PO TABS
650.0000 mg | ORAL_TABLET | Freq: Four times a day (QID) | ORAL | Status: DC | PRN
  Administered 2022-12-03 – 2022-12-05 (×2): 650 mg via ORAL
  Filled 2022-12-03 (×2): qty 2

## 2022-12-03 MED ORDER — CARVEDILOL 12.5 MG PO TABS
12.5000 mg | ORAL_TABLET | Freq: Two times a day (BID) | ORAL | Status: DC
  Administered 2022-12-03 – 2022-12-06 (×7): 12.5 mg via ORAL
  Filled 2022-12-03 (×7): qty 1

## 2022-12-03 MED ORDER — SODIUM CHLORIDE 0.9 % IV SOLN
1000.0000 mL | INTRAVENOUS | Status: DC
  Administered 2022-12-03: 1000 mL via INTRAVENOUS

## 2022-12-03 MED ORDER — SODIUM CHLORIDE 0.9 % IV SOLN
2.0000 g | INTRAVENOUS | Status: DC
  Administered 2022-12-04 – 2022-12-05 (×2): 2 g via INTRAVENOUS
  Filled 2022-12-03 (×2): qty 20

## 2022-12-03 MED ORDER — ENOXAPARIN SODIUM 80 MG/0.8ML IJ SOSY
70.0000 mg | PREFILLED_SYRINGE | INTRAMUSCULAR | Status: DC
  Administered 2022-12-03 – 2022-12-05 (×3): 70 mg via SUBCUTANEOUS
  Filled 2022-12-03 (×3): qty 0.8

## 2022-12-03 MED ORDER — HYDRALAZINE HCL 25 MG PO TABS
50.0000 mg | ORAL_TABLET | Freq: Three times a day (TID) | ORAL | Status: DC
  Administered 2022-12-03 – 2022-12-06 (×10): 50 mg via ORAL
  Filled 2022-12-03 (×10): qty 2

## 2022-12-03 MED ORDER — ISOSORBIDE MONONITRATE ER 30 MG PO TB24
90.0000 mg | ORAL_TABLET | Freq: Every day | ORAL | Status: DC
  Administered 2022-12-03 – 2022-12-06 (×4): 90 mg via ORAL
  Filled 2022-12-03 (×4): qty 3

## 2022-12-03 MED ORDER — CEFTRIAXONE SODIUM 2 G IJ SOLR
2.0000 g | Freq: Once | INTRAMUSCULAR | Status: AC
  Administered 2022-12-03: 2 g via INTRAVENOUS
  Filled 2022-12-03: qty 20

## 2022-12-03 MED ORDER — IOHEXOL 350 MG/ML SOLN
75.0000 mL | Freq: Once | INTRAVENOUS | Status: AC | PRN
  Administered 2022-12-03: 75 mL via INTRAVENOUS

## 2022-12-03 MED ORDER — METRONIDAZOLE 500 MG/100ML IV SOLN
500.0000 mg | Freq: Two times a day (BID) | INTRAVENOUS | Status: DC
  Administered 2022-12-03 – 2022-12-05 (×4): 500 mg via INTRAVENOUS
  Filled 2022-12-03 (×4): qty 100

## 2022-12-03 MED ORDER — METRONIDAZOLE 500 MG/100ML IV SOLN
500.0000 mg | Freq: Once | INTRAVENOUS | Status: AC
Start: 2022-12-03 — End: 2022-12-03
  Administered 2022-12-03: 500 mg via INTRAVENOUS
  Filled 2022-12-03: qty 100

## 2022-12-03 MED ORDER — ALBUTEROL SULFATE (2.5 MG/3ML) 0.083% IN NEBU: 2.5000 mg | INHALATION_SOLUTION | Freq: Four times a day (QID) | RESPIRATORY_TRACT | Status: DC | PRN

## 2022-12-03 NOTE — Progress Notes (Signed)
   12/03/22 0459  BiPAP/CPAP/SIPAP  BiPAP/CPAP/SIPAP Pt Type Adult  BiPAP/CPAP/SIPAP Resmed  Mask Type Full face mask  Mask Size Large  Respiratory Rate 18 breaths/min  EPAP 10 cmH2O  FiO2 (%) 36 %  Flow Rate 4 lpm  Patient Home Equipment No  Auto Titrate No  BiPAP/CPAP /SiPAP Vitals  Pulse Rate (!) 56  Resp 18  Bilateral Breath Sounds Diminished  MEWS Score/Color  MEWS Score 0  MEWS Score Color [place] Si

## 2022-12-03 NOTE — H&P (Signed)
History and Physical    PHILIPS KIMAK XBJ:[id number] DOB: 1940-12-14 DOA: 12/02/2022  PCP: Babs Sciara, MD  Patient coming from: Home  Chief Complaint: Generalized weakness  HPI: [name] is a 82 y.o. male with medical history significant of CAD, HFpEF, COPD, chronic hypoxemic respiratory failure on 4 L home oxygen, ulcerative colitis, diverticulosis, GERD, hypertension, hyperlipidemia, nephrolithiasis, ankylosing spondylitis, BPH, OSA on CPAP, morbid obesity presented to the ED with generalized weakness.  Febrile with temperature 102.7 F.  Not tachycardic or hypotensive.  Satting well on 4 L [place].  Labs significant for WBC 10.8, glucose 166, creatinine 1.5 (at baseline), calcium 10.4 (borderline elevated on previous labs as well), albumin 3.6, AST 75, ALT 55, alk phos and T. bili normal, lactic acid 2.0> 1.6, COVID/influenza/RSV PCR negative.  UA with negative nitrite, small amount of leukocytes, and microscopy showing 6-10 WBCs and no bacteria.  Blood cultures collected.  Chest x-ray showing possible trace bilateral pleural effusions.  CT abdomen pelvis showing findings consistent with possible diverticulitis and also showing patchy airspace disease at the lung bases suspicious for atelectasis or infiltrate. Patient was given Tylenol, ceftriaxone, metronidazole, and 1 L IV fluids.  [facility] called to admit.  Patient is slightly somnolent but able to wake up and answer questions.  States he lives with his daughter who brought him here to be evaluated as he has been feeling weak and cold for the past few days.  He was told he had a fever.  Patient has no other complaints.  He denies cough, shortness of breath, chest pain, nausea, vomiting, abdominal pain, or any urinary symptoms.  He is chronically on 4 L home oxygen and uses CPAP at night.  Patient reports feeling hungry and wants to eat.  Review of Systems:  Review of Systems  All other systems reviewed and are negative.   Past  Medical History:  Diagnosis Date   Ankylosing spondylitis ([name]) 11/07/2018   Asthma    BPH (benign prostatic hyperplasia)    CAD (coronary artery disease) 01/1995   CHF (congestive heart failure) ([name])    Clostridium difficile colitis 04/2005   Colitis 2011   COPD (chronic obstructive pulmonary disease) ([name])    Depression    Diverticulosis    DJD (degenerative joint disease)    Gastric ulcer 04/17/10   Three 5mm gastric ulcers, H.pylori serologies were negative   GERD (gastroesophageal reflux disease)    History of kidney stones    Hyperlipidemia    Hypertension    Idiopathic chronic inflammatory bowel disease 05/18/2010   left-sided UC   Kidney stone    Morbid obesity ([name]) 03/12/2018   Obstructive sleep apnea    on Cpap   Reflux 02/1995   S/P endoscopy 07/24/10   retained gastric contents, benign bx    Past Surgical History:  Procedure Laterality Date   ANORECTAL MANOMETRY  2016   [facility]: concern for possible fissure. Noted pelvic floor dyssnyergy   BIOPSY  07/29/2017   Procedure: BIOPSY;  Surgeon: Corbin Ade, MD;  Location: [facility];  Service: Endoscopy;;  ascending and sigmoid colon   CARDIAC CATHETERIZATION     with stent   CARDIOVASCULAR STRESS TEST  07/21/2009   No scintigraphic evidence of inducible myocardial ischemia   CARPAL TUNNEL RELEASE Left 01/17/2017   Procedure: LEFT CARPAL TUNNEL RELEASE;  Surgeon: Vickki Hearing, MD;  Location: [facility];  Service: Orthopedics;  Laterality: Left;   CATARACT EXTRACTION, BILATERAL Bilateral    CERVICAL  SPINE SURGERY     C4-5   COLONOSCOPY  04/2005   granularity and friability erosions from rectum to 40cm. Bx infection vs IBD. C. Diff positive at the time.    COLONOSCOPY  05/2010   [facility]: left-sided UC, bx with no dysplasia, shallow diverticula   COLONOSCOPY [name] 11/07/2012   [id number] proctocolitis status post segmental biopsy/Sigmoid colon polyps removed as described above. Procedure compromisd by technical  difficulties. bx: Inflammation limited to sigmoid and rectum on pathology.   COLONOSCOPY  04/2014   Dr. Chilton Si at [facility]: well localized proctocolitis limited to sigmoid   COLONOSCOPY WITH PROPOFOL [name] 07/29/2017   diverticulosis in colon, three 4-6 mm polyps at splenic flexure and in cecum, one 10 mm polyp in rectum, abnormal rectum and sigmoid consistent with active UC   CORONARY STENT PLACEMENT  01/1995   ESOPHAGEAL DILATION  01/20/2022   Procedure: ESOPHAGEAL DILATION;  Surgeon: Kathi Der, MD;  Location: [facility];  Service: Gastroenterology;;   ESOPHAGOGASTRODUODENOSCOPY  07/24/2010   WJX:BJYNWG esophagus   ESOPHAGOGASTRODUODENOSCOPY  04/2014   Dr. Chilton Si at [facility]: negative small bowel biopsies   ESOPHAGOGASTRODUODENOSCOPY (EGD) WITH PROPOFOL [name] 01/20/2022   Procedure: ESOPHAGOGASTRODUODENOSCOPY (EGD) WITH PROPOFOL;  Surgeon: Kathi Der, MD;  Location: [facility];  Service: Gastroenterology;  Laterality: [name];   FOOT SURGERY Bilateral two   KNEE SURGERY  two   POLYPECTOMY  07/29/2017   Procedure: POLYPECTOMY;  Surgeon: Corbin Ade, MD;  Location: [facility];  Service: Endoscopy;;  splenic flexure, ascending colon polyp;rectal   SHOULDER SURGERY  two   TONSILLECTOMY     TRANSTHORACIC ECHOCARDIOGRAM  03/23/2009   EF 60-65%, normal LV systolic function   ULNAR HEAD EXCISION Left 01/17/2017   Procedure: LEFT ULNAR HEAD RESECTION;  Surgeon: Vickki Hearing, MD;  Location: [facility];  Service: Orthopedics;  Laterality: Left;     reports that he quit smoking about 53 years ago. His smoking use included cigarettes. He started smoking about 73 years ago. He has a 20 pack-year smoking history. He has been exposed to tobacco smoke. He has never used smokeless tobacco. He reports that he does not drink alcohol and does not use drugs.  No Known Allergies  Family History  Problem Relation Age of Onset   Lung cancer Mother    Cancer Mother        breast   Diabetes  Mother    Stroke Father    Hypertension Father    Hyperlipidemia Father    Kidney failure Brother    Other Child        blood infection   Colon cancer Neg Hx     Prior to Admission medications   Medication Sig Start Date End Date Taking? Authorizing Provider  acetaminophen (TYLENOL) 500 MG tablet Take 2 tablets (1,000 mg total) by mouth every 6 (six) hours as needed for headache (pain). 11/24/20  Yes Luking, [name], MD  albuterol (VENTOLIN HFA) 108 (90 Base) MCG/ACT inhaler Inhale 2 puffs into the lungs every 6 (six) hours as needed for wheezing or shortness of breath. 05/21/22  Yes Babs Sciara, MD  amLODipine (NORVASC) 5 MG tablet Take one tablet po daily 09/10/22  Yes Luking, Scott A, MD  aspirin EC 81 MG tablet Take 81 mg by mouth daily. Swallow whole.   Yes [provider]  carvedilol (COREG) 12.5 MG tablet Take 1 tablet (12.5 mg total) by mouth 2 (two) times daily with a meal. 09/10/22  Yes Gerda Diss, [name], MD  DULoxetine (CYMBALTA) 60 MG capsule TAKE 1 CAPSULE BY MOUTH DAILY 09/10/22  Yes Luking, Scott A, MD  hydrALAZINE (APRESOLINE) 50 MG tablet TAKE 1 TABLET(50 MG) BY MOUTH THREE TIMES DAILY 11/30/22  Yes Luking, Scott A, MD  isosorbide mononitrate (IMDUR) 30 MG 24 hr tablet TAKE 3 TABLETS(90 MG) BY MOUTH DAILY 11/28/22  Yes Luking, Scott A, MD  nitroGLYCERIN (NITROSTAT) 0.4 MG SL tablet ONE TABLET UNDER TONGUE AS NEEDED FOR CHEST PAIN EVERY 5 MINUTES AS DIRECTED Patient taking differently: Place 0.4 mg under the tongue every 5 (five) minutes as needed for chest pain. 10/14/20  Yes Runell Gess, MD  pravastatin (PRAVACHOL) 40 MG tablet 1 qd 11/26/22  Yes Luking, [name], MD  spironolactone (ALDACTONE) 25 MG tablet Take 0.5 tablets (12.5 mg total) by mouth daily. 09/10/22  Yes Babs Sciara, MD  tamsulosin (FLOMAX) 0.4 MG CAPS capsule 1 qhs 09/10/22  Yes Luking, [name], MD  torsemide (DEMADEX) 20 MG tablet 1 qam and may take 2 pill every day prn if weight gain greater than 3  pounds in a week 09/10/22  Yes Luking, Scott A, MD  Vitamin D, Ergocalciferol, (DRISDOL) 1.25 MG (50000 UNIT) CAPS capsule TAKE 1 CAPSULE BY MOUTH EVERY 7 DAYS 11/19/22  Yes Luking, [name], MD  ALPRAZolam (XANAX) 0.25 MG tablet Take 1 tablet (0.25 mg total) by mouth 2 (two) times daily as needed for anxiety. 02/09/22   Babs Sciara, MD    Physical Exam: Vitals:   12/03/22 0030 12/03/22 0045 12/03/22 0055 12/03/22 0137  BP: (!) 147/48 (!) 144/46    Pulse: 77 74 73 73  Resp:    (!) 21  Temp:    98.2 F (36.8 C)  TempSrc:    Oral  SpO2: 97% 99% 97% 96%  Weight:      Height:        Physical Exam Vitals reviewed.  Constitutional:      General: He is not in acute distress. HENT:     Head: Normocephalic and atraumatic.  Eyes:     Extraocular Movements: Extraocular movements intact.     Pupils: Pupils are equal, round, and reactive to light.  Cardiovascular:     Rate and Rhythm: Normal rate and regular rhythm.     Pulses: Normal pulses.  Pulmonary:     Effort: Pulmonary effort is normal. No respiratory distress.     Breath sounds: Normal breath sounds. No wheezing or rales.  Abdominal:     General: Bowel sounds are normal. There is no distension.     Palpations: Abdomen is soft.     Tenderness: There is no abdominal tenderness. There is no guarding.  Musculoskeletal:     Cervical back: Normal range of motion. No rigidity.     Right lower leg: No edema.     Left lower leg: No edema.  Skin:    General: Skin is warm and dry.  Neurological:     General: No focal deficit present.     Mental Status: He is alert and oriented to person, place, and time.     Cranial Nerves: No cranial nerve deficit.     Sensory: No sensory deficit.     Motor: No weakness.     Labs on Admission: I have personally reviewed following labs and imaging studies  CBC: Recent Labs  Lab 12/02/22 2303  WBC 10.8*  NEUTROABS 8.1*  HGB 13.2  HCT 41.0  MCV 93.4  PLT 179   Basic  Metabolic  Panel: Recent Labs  Lab 12/02/22 2303  NA 137  K 4.1  CL 101  CO2 24  GLUCOSE 166*  BUN 20  CREATININE 1.53*  CALCIUM 10.4*   GFR: Estimated Creatinine Clearance: 52.8 mL/min (A) (by C-G formula based on SCr of 1.53 mg/dL (H)). Liver Function Tests: Recent Labs  Lab 12/02/22 2303  AST 75*  ALT 55*  ALKPHOS 70  BILITOT 0.3  PROT 7.5  ALBUMIN 3.6   No results for input(s): "LIPASE", "AMYLASE" in the last 168 hours. No results for input(s): "AMMONIA" in the last 168 hours. Coagulation Profile: Recent Labs  Lab 12/02/22 2303  INR 1.0   Cardiac Enzymes: No results for input(s): "CKTOTAL", "CKMB", "CKMBINDEX", "TROPONINI" in the last 168 hours. BNP (last 3 results) No results for input(s): "PROBNP" in the last 8760 hours. HbA1C: No results for input(s): "HGBA1C" in the last 72 hours. CBG: No results for input(s): "GLUCAP" in the last 168 hours. Lipid Profile: No results for input(s): "CHOL", "HDL", "LDLCALC", "TRIG", "CHOLHDL", "LDLDIRECT" in the last 72 hours. Thyroid Function Tests: No results for input(s): "TSH", "T4TOTAL", "FREET4", "T3FREE", "THYROIDAB" in the last 72 hours. Anemia Panel: No results for input(s): "VITAMINB12", "FOLATE", "FERRITIN", "TIBC", "IRON", "RETICCTPCT" in the last 72 hours. Urine analysis:    Component Value Date/Time   COLORURINE STRAW (A) 12/02/2022 2303   APPEARANCEUR CLEAR 12/02/2022 2303   LABSPEC 1.008 12/02/2022 2303   PHURINE 6.0 12/02/2022 2303   GLUCOSEU NEGATIVE 12/02/2022 2303   HGBUR SMALL (A) 12/02/2022 2303   BILIRUBINUR NEGATIVE 12/02/2022 2303   BILIRUBINUR ++ 11/18/2012 1319   KETONESUR NEGATIVE 12/02/2022 2303   PROTEINUR NEGATIVE 12/02/2022 2303   UROBILINOGEN 0.2 08/04/2014 1620   NITRITE NEGATIVE 12/02/2022 2303   LEUKOCYTESUR SMALL (A) 12/02/2022 2303    Radiological Exams on Admission: CT ABDOMEN PELVIS W CONTRAST  Result Date: 12/03/2022 CLINICAL DATA:  Left lower quadrant abdominal pain. EXAM: CT  ABDOMEN AND PELVIS WITH CONTRAST TECHNIQUE: Multidetector CT imaging of the abdomen and pelvis was performed using the standard protocol following bolus administration of intravenous contrast. RADIATION DOSE REDUCTION: This exam was performed according to the departmental dose-optimization program which includes automated exposure control, adjustment of the mA and/or kV according to patient size and/or use of iterative reconstruction technique. CONTRAST:  75mL OMNIPAQUE IOHEXOL 350 MG/ML SOLN COMPARISON:  01/24/2022. FINDINGS: Lower chest: The heart is enlarged and there is no pericardial effusion. Multi-vessel coronary artery calcifications are seen. Patchy airspace disease is present in the lower lobes bilaterally. Hepatobiliary: No focal liver abnormality is seen. Stones are present within the gallbladder. No biliary ductal dilatation is seen. Pancreas: Unremarkable. No pancreatic ductal dilatation or surrounding inflammatory changes. Spleen: Normal in size without focal abnormality. Adrenals/Urinary Tract: The adrenal glands are within normal limits. A cyst is present in the left kidney. Additional hypodensities are present in the kidneys bilaterally which are too small to further characterize. Renal calculi are present bilaterally. There is prominence of the collecting system on the left which is unchanged from the previous exam. No ureteral calculus or obstructive uropathy is seen. The bladder is unremarkable. Stomach/Bowel: Stomach is within normal limits. Appendix appears normal. No bowel obstruction, free air or pneumatosis is seen. Scattered diverticula are present along the descending colon with minimal associated fat stranding. Vascular/Lymphatic: Aortic atherosclerosis. Nonspecific prominent lymph nodes are noted in the retroperitoneum. Reproductive: Prostate is unremarkable. Other: No abdominopelvic ascites. A fat containing umbilical hernia is present. Musculoskeletal: Degenerative changes are present  in  the thoracolumbar spine. There is partial ankylosis of the sacroiliac joints bilaterally. No acute osseous abnormality. IMPRESSION: 1. Few scattered diverticula along the descending colon with subtle fat stranding, possible diverticulitis. 2. Patchy airspace disease at the lung bases, possible atelectasis or infiltrate. 3. Cholelithiasis. 4. Bilateral nephrolithiasis. 5. Aortic atherosclerosis. Electronically Signed   By: Thornell Sartorius M.D.   On: 12/03/2022 01:57   DG Chest 2 View  Result Date: 12/02/2022 CLINICAL DATA:  Suspected Sepsis EXAM: CHEST - 2 VIEW COMPARISON:  Chest x-ray 02/13/2022, CT chest 01/16/2022 FINDINGS: The heart and mediastinal contours are unchanged. Aortic calcification. Chronic coarsened interstitial markings. No focal consolidation. No pulmonary edema. Possible trace bilateral pleural effusions. No pneumothorax. No acute osseous abnormality. IMPRESSION: 1. Possible trace bilateral pleural effusions 2. Aortic Atherosclerosis (ICD10-I70.0) and Emphysema (ICD10-J43.9). Electronically Signed   By: Tish Frederickson M.D.   On: 12/02/2022 23:47    EKG: Ordered and currently pending.  Assessment and Plan  Possible acute diverticulitis Temperature 100.7 F, WBC count 10.8, lactate 2.0> 1.6.  Fever has subsided with Tylenol.  Not tachycardic or hypotensive.  CT abdomen pelvis showing findings consistent with possible diverticulitis and also showing patchy airspace disease at the lung bases suspicious for atelectasis or infiltrate.  Pneumonia less likely as patient is not endorsing any respiratory symptoms and no change in oxygen requirement from baseline.  COVID/influenza/RSV PCR negative. -Continue ceftriaxone and metronidazole -Patient was given 1 L IV fluids in the ED.  Lactic acidosis has resolved and not hypotensive. -Follow-up blood cultures -Trend WBC count  Mild elevation of transaminases Likely due to acute illness.  Alk phos and T. bili normal.  CT showing  cholelithiasis and no acute hepatobiliary abnormality.  Trend LFTs.  CAD EKG pending.  Patient denies chest pain.  Continue aspirin, Coreg, and Imdur.  Chronic HFpEF Last echo done in December 2023 showing EF 55 to 60%.  No signs of volume overload.  Check BNP.  COPD Chronic hypoxemic respiratory failure on 4 L home oxygen Stable, no signs of acute exacerbation.  No change in oxygen requirement from baseline.  Continue supplemental oxygen.  He is not on a maintenance inhaler at home.  Continue albuterol as needed.  CKD stage IIIa Creatinine at baseline, continue to monitor labs.  Hypertension SBP in the 140s.  Continue amlodipine, Coreg, hydralazine, Imdur, and spironolactone.  Hyperlipidemia Hold statin at this time and repeat LFTs in the morning.  OSA Continue nightly CPAP.  Mood disorder Continue Cymbalta.  DVT prophylaxis: Lovenox Code Status: DNR (discussed with the patient) Family Communication: No family available at this time. Level of care: Telemetry bed Admission status: It is my clinical opinion that referral for OBSERVATION is reasonable and necessary in this patient based on the above information provided. The aforementioned taken together are felt to place the patient at high risk for further clinical deterioration. However, it is anticipated that the patient may be medically stable for discharge from the hospital within 24 to 48 hours.  John Giovanni MD [facility]  If 7PM-7AM, please contact night-coverage [email or web address]  12/03/2022, 2:15 AM

## 2022-12-03 NOTE — Sepsis Progress Note (Signed)
Following for sepsis monitoring ?

## 2022-12-03 NOTE — Hospital Course (Signed)
  Brief hospital course: 82yo with h/o CAD, HFpER, COPD on 4L home O2, UC, HTN, HLD, ankylosing spondylitis, BPH, OSA on CPAP and morbid obesity who presented on 10/27 with fever to 102.7 in the setting of diverticulitis. He was started on Ceftriaxone and metronidazole.  Eventually due to concerns of pneumonia azithromycin was added.  Blood cultures positive for Staph hominis, and case was discussed with ID.  This was thought to be contaminated.  Changed to oral antibiotics. Today doing well, will dc him with HH.    Assessment and Plan:   Sepsis secondary to diverticulitis with possible pneumonia Strep hominis bacteremia Sepsis physiology slowly improving.  Monitor culture data.  I suspect this is a contaminated therefore we will hold off on further treatment.  Procalcitonin 0.3, BNP 125. Switch to PO Aug/Azi for now.  Last day November 5 - Can use home as needed bronchodilators.   Elevated LFTs, improved Improved   CAD Chest pain-free.  Continue aspirin, Coreg, Imdur, statin   Chronic HFpEF Last echo done in December 2023 showing EF 55 to 60% Appears euvolemic.  Torsemide on hold   COPD with chronic hypoxia 4 L nasal cannula Continue bronchodilators   AKI on CKD stage IIIa Bilateral renal stones, nonobstructive Baseline creatinine 1.4.  peaked at 1.88, now at 1.3 Recommend oral hydration   Hypertension Norvasc, Coreg, Imdur   Hyperlipidemia We will resume statin   OSA Continue nightly CPAP   Mood disorder/dementia Delirium precaution.  Cymbalta   GOC Patient is DNR confirmed by previous provider  PT/OT= f16f done   DVT ppx: Lovenox Code: DNR Family Communication:  Disposition: Status is: Inpatient Discharge today     Subjective: No complaints, feels ok.  Overall feels well.  Wishes to go home  Examination:   General exam: Appears calm and comfortable, on chronic 4 L nasal cannula Respiratory system: Minimal bilateral rhonchi-appears to be  chronic Cardiovascular system: S1 & S2 heard, RRR. No JVD, murmurs, rubs, gallops or clicks. No pedal edema. Gastrointestinal system: Abdomen is nondistended, soft and nontender. No organomegaly or masses felt. Normal bowel sounds heard. Central nervous system: Alert and oriented. No focal neurological deficits. Extremities: Symmetric 5 x 5 power. Skin: No rashes, lesions or ulcers Psychiatry: Judgement and insight appear normal. Mood & affect appropriate.

## 2022-12-03 NOTE — ED Notes (Signed)
ED TO INPATIENT HANDOFF REPORT  ED Nurse Name and Phone #: Pollie Meyer [phone]  S Name/Age/Gender [name] 82 y.o. male Room/Bed: 035C/035C  Code Status   Code Status: Prior  Home/SNF/Other Home Patient oriented to: self, place, time, and situation Is this baseline? Yes   Triage Complete: Triage complete  Chief Complaint Acute diverticulitis [K57.92]  Triage Note No notes on file   Allergies No Known Allergies  Level of Care/Admitting Diagnosis ED Disposition     ED Disposition  Admit   Condition  --   Comment  Hospital Area: [facility] [[id number]]  Level of Care: Telemetry Medical [104]  May place patient in observation at [facility] or [facility] if equivalent level of care is available:: Yes  Covid Evaluation: Asymptomatic - no recent exposure (last 10 days) testing not required  Diagnosis: Acute diverticulitis [[id number]]  Admitting Physician: John Giovanni [[id number]]  Attending Physician: John Giovanni [[id number]]          B Medical/Surgery History Past Medical History:  Diagnosis Date   Ankylosing spondylitis ([name]) 11/07/2018   Asthma    BPH (benign prostatic hyperplasia)    CAD (coronary artery disease) 01/1995   CHF (congestive heart failure) ([name])    Clostridium difficile colitis 04/2005   Colitis 2011   COPD (chronic obstructive pulmonary disease) ([name])    Depression    Diverticulosis    DJD (degenerative joint disease)    Gastric ulcer 04/17/10   Three 5mm gastric ulcers, H.pylori serologies were negative   GERD (gastroesophageal reflux disease)    History of kidney stones    Hyperlipidemia    Hypertension    Idiopathic chronic inflammatory bowel disease 05/18/2010   left-sided UC   Kidney stone    Morbid obesity ([name]) 03/12/2018   Obstructive sleep apnea    on Cpap   Reflux 02/1995   S/P endoscopy 07/24/10   retained gastric contents, benign bx   Past Surgical History:  Procedure Laterality Date    ANORECTAL MANOMETRY  2016   [facility]: concern for possible fissure. Noted pelvic floor dyssnyergy   BIOPSY  07/29/2017   Procedure: BIOPSY;  Surgeon: Corbin Ade, MD;  [facility];  Service: Endoscopy;;  ascending and sigmoid colon   CARDIAC CATHETERIZATION     with stent   CARDIOVASCULAR STRESS TEST  07/21/2009   No scintigraphic evidence of inducible myocardial ischemia   CARPAL TUNNEL RELEASE Left 01/17/2017   Procedure: LEFT CARPAL TUNNEL RELEASE;  Surgeon: Vickki Hearing, MD;  Location: [facility] ORS;  Service: Orthopedics;  Laterality: Left;   CATARACT EXTRACTION, BILATERAL Bilateral    CERVICAL SPINE SURGERY     C4-5   COLONOSCOPY  04/2005   granularity and friability erosions from rectum to 40cm. Bx infection vs IBD. C. Diff positive at the time.    COLONOSCOPY  05/2010   [facility]: left-sided UC, bx with no dysplasia, shallow diverticula   COLONOSCOPY [name] 11/07/2012   QMV:[id number]-[id number] proctocolitis status post segmental biopsy/Sigmoid colon polyps removed as described above. Procedure compromisd by technical difficulties. bx: Inflammation limited to sigmoid and rectum on pathology.   COLONOSCOPY  04/2014   Dr. Chilton Si at [facility]: well localized proctocolitis limited to sigmoid   COLONOSCOPY WITH PROPOFOL [name] 07/29/2017   diverticulosis in colon, three 4-6 mm polyps at splenic flexure and in cecum, one 10 mm polyp in rectum, abnormal rectum and sigmoid consistent with active UC   CORONARY STENT PLACEMENT  01/1995   ESOPHAGEAL DILATION  01/20/2022   Procedure: ESOPHAGEAL DILATION;  Surgeon: Kathi Der, MD;  Location: [facility];  Service: Gastroenterology;;   ESOPHAGOGASTRODUODENOSCOPY  07/24/2010   WGN:FAOZHY esophagus   ESOPHAGOGASTRODUODENOSCOPY  04/2014   Dr. Chilton Si at [facility]: negative small bowel biopsies   ESOPHAGOGASTRODUODENOSCOPY (EGD) WITH PROPOFOL [name] 01/20/2022   Procedure: ESOPHAGOGASTRODUODENOSCOPY (EGD) WITH PROPOFOL;  Surgeon: Kathi Der, MD;   Location: [facility];  Service: Gastroenterology;  Laterality: [name];   FOOT SURGERY Bilateral two   KNEE SURGERY  two   POLYPECTOMY  07/29/2017   Procedure: POLYPECTOMY;  Surgeon: Corbin Ade, MD;  [facility];  Service: Endoscopy;;  splenic flexure, ascending colon polyp;rectal   SHOULDER SURGERY  two   TONSILLECTOMY     TRANSTHORACIC ECHOCARDIOGRAM  03/23/2009   EF 60-65%, normal LV systolic function   ULNAR HEAD EXCISION Left 01/17/2017   Procedure: LEFT ULNAR HEAD RESECTION;  Surgeon: Vickki Hearing, MD;  Location: [facility];  Service: Orthopedics;  Laterality: Left;     A IV Location/Drains/Wounds Patient Lines/Drains/Airways Status     Active Line/Drains/Airways     Name Placement date Placement time Site Days   Peripheral IV 12/02/22 20 G 1" Left;Posterior Hand 12/02/22  2318  Hand  1   External Urinary Catheter 12/03/22  0148  --  less than 1            Intake/Output Last 24 hours  Intake/Output Summary (Last 24 hours) at 12/03/2022 0220 Last data filed at 12/03/2022 0220 Gross per 24 hour  Intake 100 ml  Output --  Net 100 ml    Labs/Imaging Results for orders placed or performed during the hospital encounter of 12/02/22 (from the past 48 hour(s))  Comprehensive metabolic panel     Status: Abnormal   Collection Time: 12/02/22 11:03 PM  Result Value Ref Range   Sodium 137 135 - 145 mmol/L   Potassium 4.1 3.5 - 5.1 mmol/L   Chloride 101 98 - 111 mmol/L   CO2 24 22 - 32 mmol/L   Glucose, Bld 166 (H) 70 - 99 mg/dL    Comment: Glucose reference range applies only to samples taken after fasting for at least 8 hours.   BUN 20 8 - 23 mg/dL   Creatinine, Ser 8.65 (H) 0.61 - 1.24 mg/dL   Calcium 78.4 (H) 8.9 - 10.3 mg/dL   Total Protein 7.5 6.5 - 8.1 g/dL   Albumin 3.6 3.5 - 5.0 g/dL   AST 75 (H) 15 - 41 U/L   ALT 55 (H) 0 - 44 U/L   Alkaline Phosphatase 70 38 - 126 U/L   Total Bilirubin 0.3 0.3 - 1.2 mg/dL   GFR, Estimated 45 (L) >60 mL/min     Comment: (NOTE) Calculated using the CKD-EPI Creatinine Equation (2021)    Anion gap 12 5 - 15    Comment: Performed at [facility], [address], [place], [place] [address]  CBC with Differential     Status: Abnormal   Collection Time: 12/02/22 11:03 PM  Result Value Ref Range   WBC 10.8 (H) 4.0 - 10.5 K/uL   RBC 4.39 4.22 - 5.81 MIL/uL   Hemoglobin 13.2 13.0 - 17.0 g/dL   HCT 52.8 41.3 - 24.4 %   MCV 93.4 80.0 - 100.0 fL   MCH 30.1 26.0 - 34.0 pg   MCHC 32.2 30.0 - 36.0 g/dL   RDW 01.0 27.2 - 53.6 %   Platelets 179 150 - 400 K/uL  nRBC 0.0 0.0 - 0.2 %   Neutrophils Relative % 74 %   Neutro Abs 8.1 (H) 1.7 - 7.7 K/uL   Lymphocytes Relative 17 %   Lymphs Abs 1.9 0.7 - 4.0 K/uL   Monocytes Relative 6 %   Monocytes Absolute 0.7 0.1 - 1.0 K/uL   Eosinophils Relative 1 %   Eosinophils Absolute 0.1 0.0 - 0.5 K/uL   Basophils Relative 1 %   Basophils Absolute 0.1 0.0 - 0.1 K/uL   Immature Granulocytes 1 %   Abs Immature Granulocytes 0.05 0.00 - 0.07 K/uL    Comment: Performed at [facility], [address], [place], [place] [address]  Protime-INR     Status: None   Collection Time: 12/02/22 11:03 PM  Result Value Ref Range   Prothrombin Time 13.4 11.4 - 15.2 seconds   INR 1.0 0.8 - 1.2    Comment: (NOTE) INR goal varies based on device and disease states. Performed at [facility], [address], [place], [place] [address]   Urinalysis, w/ Reflex to Culture (Infection Suspected) -Urine, Clean Catch     Status: Abnormal   Collection Time: 12/02/22 11:03 PM  Result Value Ref Range   Specimen Source URINE, CATHETERIZED    Color, Urine STRAW (A) YELLOW   APPearance CLEAR CLEAR   Specific Gravity, Urine 1.008 1.005 - 1.030   pH 6.0 5.0 - 8.0   Glucose, UA NEGATIVE NEGATIVE mg/dL   Hgb urine dipstick SMALL (A) NEGATIVE   Bilirubin Urine NEGATIVE NEGATIVE   Ketones, ur NEGATIVE NEGATIVE mg/dL   Protein, ur NEGATIVE NEGATIVE mg/dL   Nitrite NEGATIVE  NEGATIVE   Leukocytes,Ua SMALL (A) NEGATIVE   RBC / HPF 0-5 0 - 5 RBC/hpf   WBC, UA 6-10 0 - 5 WBC/hpf    Comment:        Reflex urine culture not performed if WBC <=10, OR if Squamous epithelial cells >5. If Squamous epithelial cells >5 suggest recollection.    Bacteria, UA NONE SEEN NONE SEEN   Squamous Epithelial / HPF 0-5 0 - 5 /HPF   Mucus PRESENT    Hyaline Casts, UA PRESENT     Comment: Performed at [facility], [address], [place], [place] [address]  I-Stat Lactic Acid, ED     Status: Abnormal   Collection Time: 12/02/22 11:35 PM  Result Value Ref Range   Lactic Acid, Venous 2.0 (HH) 0.5 - 1.9 mmol/L   Comment NOTIFIED PHYSICIAN   Resp panel by RT-PCR (RSV, Flu A&B, Covid) Anterior Nasal Swab     Status: None   Collection Time: 12/02/22 11:54 PM   Specimen: Anterior Nasal Swab  Result Value Ref Range   SARS Coronavirus 2 by RT PCR NEGATIVE NEGATIVE   Influenza A by PCR NEGATIVE NEGATIVE   Influenza B by PCR NEGATIVE NEGATIVE    Comment: (NOTE) The Xpert Xpress SARS-CoV-2/FLU/RSV plus assay is intended as an aid in the diagnosis of influenza from Nasopharyngeal swab specimens and should not be used as a sole basis for treatment. Nasal washings and aspirates are unacceptable for Xpert Xpress SARS-CoV-2/FLU/RSV testing.  Fact Sheet for Patients: BloggerCourse.com  Fact Sheet for Healthcare Providers: SeriousBroker.it  This test is not yet approved or cleared by the [place] FDA and has been authorized for detection and/or diagnosis of SARS-CoV-2 by FDA under an Emergency Use Authorization (EUA). This EUA will remain in effect (meaning this test can be used) for the duration  of the COVID-19 declaration under Section 564(b)(1) of the Act, 21 U.S.C. section 360bbb-3(b)(1), unless the authorization is terminated or revoked.     Resp Syncytial Virus by PCR NEGATIVE NEGATIVE    Comment: (NOTE) Fact  Sheet for Patients: BloggerCourse.com  Fact Sheet for Healthcare Providers: SeriousBroker.it  This test is not yet approved or cleared by the [place] FDA and has been authorized for detection and/or diagnosis of SARS-CoV-2 by FDA under an Emergency Use Authorization (EUA). This EUA will remain in effect (meaning this test can be used) for the duration of the COVID-19 declaration under Section 564(b)(1) of the Act, 21 U.S.C. section 360bbb-3(b)(1), unless the authorization is terminated or revoked.  Performed at [facility], [address], [place], [place] [address]   I-Stat Lactic Acid, ED     Status: None   Collection Time: 12/03/22  1:50 AM  Result Value Ref Range   Lactic Acid, Venous 1.6 0.5 - 1.9 mmol/L   CT ABDOMEN PELVIS W CONTRAST  Result Date: 12/03/2022 CLINICAL DATA:  Left lower quadrant abdominal pain. EXAM: CT ABDOMEN AND PELVIS WITH CONTRAST TECHNIQUE: Multidetector CT imaging of the abdomen and pelvis was performed using the standard protocol following bolus administration of intravenous contrast. RADIATION DOSE REDUCTION: This exam was performed according to the departmental dose-optimization program which includes automated exposure control, adjustment of the mA and/or kV according to patient size and/or use of iterative reconstruction technique. CONTRAST:  75mL OMNIPAQUE IOHEXOL 350 MG/ML SOLN COMPARISON:  01/24/2022. FINDINGS: Lower chest: The heart is enlarged and there is no pericardial effusion. Multi-vessel coronary artery calcifications are seen. Patchy airspace disease is present in the lower lobes bilaterally. Hepatobiliary: No focal liver abnormality is seen. Stones are present within the gallbladder. No biliary ductal dilatation is seen. Pancreas: Unremarkable. No pancreatic ductal dilatation or surrounding inflammatory changes. Spleen: Normal in size without focal abnormality. Adrenals/Urinary Tract:  The adrenal glands are within normal limits. A cyst is present in the left kidney. Additional hypodensities are present in the kidneys bilaterally which are too small to further characterize. Renal calculi are present bilaterally. There is prominence of the collecting system on the left which is unchanged from the previous exam. No ureteral calculus or obstructive uropathy is seen. The bladder is unremarkable. Stomach/Bowel: Stomach is within normal limits. Appendix appears normal. No bowel obstruction, free air or pneumatosis is seen. Scattered diverticula are present along the descending colon with minimal associated fat stranding. Vascular/Lymphatic: Aortic atherosclerosis. Nonspecific prominent lymph nodes are noted in the retroperitoneum. Reproductive: Prostate is unremarkable. Other: No abdominopelvic ascites. A fat containing umbilical hernia is present. Musculoskeletal: Degenerative changes are present in the thoracolumbar spine. There is partial ankylosis of the sacroiliac joints bilaterally. No acute osseous abnormality. IMPRESSION: 1. Few scattered diverticula along the descending colon with subtle fat stranding, possible diverticulitis. 2. Patchy airspace disease at the lung bases, possible atelectasis or infiltrate. 3. Cholelithiasis. 4. Bilateral nephrolithiasis. 5. Aortic atherosclerosis. Electronically Signed   By: Thornell Sartorius M.D.   On: 12/03/2022 01:57   DG Chest 2 View  Result Date: 12/02/2022 CLINICAL DATA:  Suspected Sepsis EXAM: CHEST - 2 VIEW COMPARISON:  Chest x-ray 02/13/2022, CT chest 01/16/2022 FINDINGS: The heart and mediastinal contours are unchanged. Aortic calcification. Chronic coarsened interstitial markings. No focal consolidation. No pulmonary edema. Possible trace bilateral pleural effusions. No pneumothorax. No acute osseous abnormality. IMPRESSION: 1. Possible trace bilateral pleural effusions 2. Aortic Atherosclerosis (ICD10-I70.0) and Emphysema (ICD10-J43.9).  Electronically Signed   By: Blanchie Serve  Tessie Fass M.D.   On: 12/02/2022 23:47    Pending Labs Unresulted Labs (From admission, onward)     Start     Ordered   12/02/22 2350  Culture, blood (Routine X 2) w Reflex to ID Panel  Once,   R        12/02/22 2350   12/02/22 2303  Culture, blood (Routine x 2)  BLOOD CULTURE X 2,   R (with STAT occurrences)      12/02/22 2302            Vitals/Pain Today's Vitals   12/03/22 0030 12/03/22 0045 12/03/22 0055 12/03/22 0137  BP: (!) 147/48 (!) 144/46    Pulse: 77 74 73 73  Resp:    (!) 21  Temp:    98.2 F (36.8 C)  TempSrc:    Oral  SpO2: 97% 99% 97% 96%  Weight:      Height:      PainSc:    0-No pain    Isolation Precautions No active isolations  Medications Medications  sodium chloride 0.9 % bolus 1,000 mL (1,000 mLs Intravenous New Bag/Given 12/03/22 0020)    Followed by  0.9 %  sodium chloride infusion (1,000 mLs Intravenous New Bag/Given 12/03/22 0029)  metroNIDAZOLE (FLAGYL) IVPB 500 mg (500 mg Intravenous New Bag/Given 12/03/22 0155)  acetaminophen (TYLENOL) tablet 650 mg (650 mg Oral Given 12/03/22 0025)  cefTRIAXone (ROCEPHIN) 2 g in sodium chloride 0.9 % 100 mL IVPB (0 g Intravenous Stopped 12/03/22 0220)  iohexol (OMNIPAQUE) 350 MG/ML injection 75 mL (75 mLs Intravenous Contrast Given 12/03/22 0137)    Mobility walks with device     Focused Assessments Cardiac Assessment Handoff:    Lab Results  Component Value Date   CKTOTAL 94 04/13/2010   CKMB 0.9 04/13/2010   TROPONINI 0.07 (HH) 03/15/2018   Lab Results  Component Value Date   DDIMER 1.37 (H) 04/12/2019   Does the Patient currently have chest pain? No   , Neuro Assessment Handoff:  Swallow screen pass? Yes          Neuro Assessment:   Neuro Checks:      Has TPA been given? No If patient is a Neuro Trauma and patient is going to OR before floor call report to 4N Charge nurse: [phone] or [phone]  , Pulmonary Assessment  Handoff:  Lung sounds:   O2 Device: Nasal Cannula O2 Flow Rate (L/min): 4 L/min    R Recommendations: See Admitting Provider Note  Report given to:   Additional Notes:

## 2022-12-03 NOTE — ED Notes (Signed)
Pt currently in CT.

## 2022-12-03 NOTE — Plan of Care (Signed)
  Problem: Clinical Measurements: Goal: Respiratory complications will improve Outcome: Progressing   Problem: Elimination: Goal: Will not experience complications related to bowel motility Outcome: Progressing Goal: Will not experience complications related to urinary retention Outcome: Progressing   Problem: Pain Management: Goal: General experience of comfort will improve Outcome: Progressing   Problem: Safety: Goal: Ability to remain free from injury will improve Outcome: Progressing

## 2022-12-03 NOTE — Plan of Care (Signed)

## 2022-12-03 NOTE — Progress Notes (Signed)
Mobility Specialist Progress Note:    12/03/22 1542  Mobility  Activity Transferred from bed to chair  Level of Assistance Moderate assist, patient does 50-74%  Assistive Device Front wheel walker  Distance Ambulated (ft) 3 ft  Activity Response Tolerated well  Mobility Referral Yes  $Mobility charge 1 Mobility  Mobility Specialist Start Time (ACUTE ONLY) 1535  Mobility Specialist Stop Time (ACUTE ONLY) 1542  Mobility Specialist Time Calculation (min) (ACUTE ONLY) 7 min   Pt received in bed, family member at bedside. Agreeable to transfer B>C, required ModA to sit EOB, and MinA to stand and pivot to chair with RW. Tolerated well, asx throughout. All needs met, call bell in reach, chair alarm on.   Feliciana Rossetti Mobility Specialist Please contact via Special educational needs teacher or  Rehab office at [phone]

## 2022-12-03 NOTE — Progress Notes (Signed)
Progress Note   Patient: [name] VHQ:[id number] DOB: [birth year] DOA: 12/02/2022     0 DOS: the patient was seen and examined on 12/03/2022   Brief hospital course: 82yo with h/o CAD, HFpER, COPD on 4L home O2, UC, HTN, HLD, ankylosing spondylitis, BPH, OSA on CPAP and morbid obesity who presented on 10/27 with fever to 102.7 in the setting of diverticulitis. He was started on Ceftriaxone and metronidazole.    Assessment and Plan:  Sepsis due to diverticulitis -Sepsis indicates life-threatening organ dysfunction with mortality >10%, caused by dysregulation to host response.   -SIRS criteria in this patient includes: Leukocytosis, fever, tachypnea -Mild lactic acidosis, resolved on repeat -Sepsis protocol initiated -Suspected source is diverticulitis vs. PNA (neither is clear on imaging) -For now, treat with Ceftriaxone and metronidazole -Blood cultures pending -Admitted to Tele -> Med Surg  Elevated LFTs Likely due to acute illness, improving   CT showing cholelithiasis and no acute hepatobiliary abnormality.  Trend LFTs.   CAD EKG pending Patient denies chest pain Continue aspirin, Coreg, and Imdur.   Chronic HFpEF Last echo done in December 2023 showing EF 55 to 60% No signs of volume overload Hold torsemide for now   COPD Chronic hypoxemic respiratory failure on 4 L home oxygen Stable, no signs of acute exacerbation No change in oxygen requirement from baseline Continue albuterol as needed   CKD stage IIIa Appears to be stable at this time Attempt to avoid nephrotoxic medications Recheck BMP in AM    Hypertension SBP in the 140s Continue amlodipine, Coreg, hydralazine, Imdur, and spironolactone   Hyperlipidemia Hold statin at this time and repeat LFTs in the morning.   OSA Continue nightly CPAP   Mood disorder/dementia Continue Cymbalta Delirium precautions  GOC Patient is DNR Discussed placement with daughter but she prefers home with her  with [facility] assistance PT/OT/TOC team consults requested    Consultants: PT/OT TOC team  Procedures: None  Antibiotics: Ceftriaxone 10/27- Metronidazole 10/27-      Subjective: Patient in inconsistent in reporting discomfort due to cognitive impairment.  No obvious concerns.   Objective: Vitals:   12/03/22 0331 12/03/22 0459  BP: (!) 127/39   Pulse: 65 (!) 56  Resp: (!) 21 18  Temp: 98.1 F (36.7 C)   SpO2: (!) 87%     Intake/Output Summary (Last 24 hours) at 12/03/2022 0750 Last data filed at 12/03/2022 4132 Gross per 24 hour  Intake 1660.03 ml  Output --  Net 1660.03 ml   Filed Weights   12/02/22 2300  Weight: (!) 140 kg    Exam:  General:  Appears calm and comfortable and is in NAD Eyes:  EOMI, normal lids, iris ENT:  grossly normal hearing, lips & tongue, mmm Neck:  no LAD, masses or thyromegaly Cardiovascular:  RRR, no m/r/g. No LE edema.  Respiratory:   Scattered rhonchi.  Mildly increased respiratory effort. Abdomen:  soft, NT (on first attempt at palpation but after I asked if he had pain he had diffuse TTP), ND Skin:  no rash or induration seen on limited exam Musculoskeletal:   no bony abnormality, not overly mobile Psychiatric: blunted/mildly confused mood and affect, speech sparse but appropriate Neurologic:  CN 2-12 grossly intact  Data Reviewed: I have reviewed the patient's lab results since admission.  Pertinent labs for today include:   Glucose 146 BUN 19/Creatinine 1.49/GFR 46 - stable Albumin 3.0 AST 50/ALT 43; improved from 75/55 Lactate 2, 1.6 WBC 10.7 Hgb 11.9    Family  Communication: Daughter was present throughout evaluation  Disposition: Status is: Inpatient Admit - It is my clinical opinion that admission to INPATIENT is reasonable and necessary because of the expectation that this patient will require hospital care that crosses at least 2 midnights to treat this condition based on the medical complexity of the problems  presented.  Given the aforementioned information, the predictability of an adverse outcome is felt to be significant.      Time spent: 50 minutes  Unresulted Labs (From admission, onward)     Start     Ordered   12/03/22 0500  CBC  Tomorrow morning,   R        12/03/22 0307   12/03/22 0500  Comprehensive metabolic panel  Tomorrow morning,   R        12/03/22 0307   12/03/22 0500  Brain natriuretic peptide  Tomorrow morning,   R        12/03/22 0308   12/02/22 2350  Culture, blood (Routine X 2) w Reflex to ID Panel  Once,   R        12/02/22 2350   12/02/22 2303  Culture, blood (Routine x 2)  BLOOD CULTURE X 2,   R      12/02/22 2302             Author: Jonah Blue, MD 12/03/2022 7:50 AM  For on call review [email or web address].

## 2022-12-04 ENCOUNTER — Inpatient Hospital Stay (HOSPITAL_COMMUNITY): Payer: Medicare Other

## 2022-12-04 ENCOUNTER — Ambulatory Visit: Payer: Self-pay | Admitting: *Deleted

## 2022-12-04 DIAGNOSIS — K5792 Diverticulitis of intestine, part unspecified, without perforation or abscess without bleeding: Secondary | ICD-10-CM | POA: Diagnosis not present

## 2022-12-04 DIAGNOSIS — A411 Sepsis due to other specified staphylococcus: Secondary | ICD-10-CM | POA: Diagnosis present

## 2022-12-04 LAB — BASIC METABOLIC PANEL
Anion gap: 4 — ABNORMAL LOW (ref 5–15)
BUN: 23 mg/dL (ref 8–23)
CO2: 29 mmol/L (ref 22–32)
Calcium: 9.7 mg/dL (ref 8.9–10.3)
Chloride: 104 mmol/L (ref 98–111)
Creatinine, Ser: 1.88 mg/dL — ABNORMAL HIGH (ref 0.61–1.24)
GFR, Estimated: 35 mL/min — ABNORMAL LOW (ref >60)
Glucose, Bld: 177 mg/dL — ABNORMAL HIGH (ref 70–99)
Potassium: 3.8 mmol/L (ref 3.5–5.1)
Sodium: 137 mmol/L (ref 135–145)

## 2022-12-04 LAB — CBC WITH DIFFERENTIAL/PLATELET
Abs Immature Granulocytes: 0.05 10*3/uL (ref 0.00–0.07)
Basophils Absolute: 0 10*3/uL (ref 0.0–0.1)
Basophils Relative: 1 %
Eosinophils Absolute: 0.1 10*3/uL (ref 0.0–0.5)
Eosinophils Relative: 1 %
HCT: 37.5 % — ABNORMAL LOW (ref 39.0–52.0)
Hemoglobin: 12 g/dL — ABNORMAL LOW (ref 13.0–17.0)
Immature Granulocytes: 1 %
Lymphocytes Relative: 26 %
Lymphs Abs: 2.3 10*3/uL (ref 0.7–4.0)
MCH: 29.9 pg (ref 26.0–34.0)
MCHC: 32 g/dL (ref 30.0–36.0)
MCV: 93.5 fL (ref 80.0–100.0)
Monocytes Absolute: 0.6 10*3/uL (ref 0.1–1.0)
Monocytes Relative: 7 %
Neutro Abs: 5.8 10*3/uL (ref 1.7–7.7)
Neutrophils Relative %: 64 %
Platelets: 155 10*3/uL (ref 150–400)
RBC: 4.01 MIL/uL — ABNORMAL LOW (ref 4.22–5.81)
RDW: 13.6 % (ref 11.5–15.5)
WBC: 8.8 10*3/uL (ref 4.0–10.5)
nRBC: 0 % (ref 0.0–0.2)

## 2022-12-04 LAB — CULTURE, BLOOD (ROUTINE X 2): Special Requests: ADEQUATE

## 2022-12-04 LAB — PROCALCITONIN: Procalcitonin: 0.32 ng/mL

## 2022-12-04 MED ORDER — BENZONATATE 100 MG PO CAPS
200.0000 mg | ORAL_CAPSULE | Freq: Three times a day (TID) | ORAL | Status: DC | PRN
  Administered 2022-12-04 (×3): 200 mg via ORAL
  Filled 2022-12-04 (×3): qty 2

## 2022-12-04 MED ORDER — SODIUM CHLORIDE 0.9 % IV SOLN
500.0000 mg | INTRAVENOUS | Status: DC
  Administered 2022-12-04: 500 mg via INTRAVENOUS
  Filled 2022-12-04 (×2): qty 5

## 2022-12-04 NOTE — Patient Instructions (Signed)
Visit Information  Thank you for taking time to visit with me today. Please don't hesitate to contact me if I can be of assistance to you.   Following are the goals we discussed today:   Goals Addressed             This Visit's Progress    Receive Assistance Obtaining In-Home Care Services.   On track    Care Coordination Interventions:  Interventions Today    Flowsheet Row Most Recent Value  Chronic Disease   Chronic disease during today's visit Chronic Obstructive Pulmonary Disease (COPD), Congestive Heart Failure (CHF), Hypertension (HTN), Chronic Kidney Disease/End Stage Renal Disease (ESRD), Other  [Morbid Obesity, Major Depression, Single Episode, Moderate, Prediabetes, Daughter with Caregiver Burnout/Stress/Fatigue, Inability to Perform Activities of Daily Living Independently, Ulcerative Collitus, Memory Deficits]  General Interventions   General Interventions Discussed/Reviewed General Interventions Discussed, Durable Medical Equipment (DME), Communication with, Level of Care, General Interventions Reviewed, [employer], Labs  Labs Hgb A1c every 3 months, Kidney Function  Doctor Visits Discussed/Reviewed Doctor Visits Discussed, Doctor Visits Reviewed, Annual Wellness Visits, PCP, Database administrator (DME) Bed side commode, BP Cuff, Environmental consultant, Wheelchair, Tour manager, Other  [Hand-Held Chief Strategy Officer, Incontinence Pads & Depends]  Wheelchair Standard  PCP/Specialist Visits Compliance with follow-up visit  Communication with PCP/Specialists, RN  Level of Care Adult Daycare, Applications, Assisted Living, Skilled Nursing Facility, Teaching laboratory technician Medicaid, Personal Care Services, [place]-2  Exercise Interventions   Exercise Discussed/Reviewed Exercise Discussed, Assistive device use and maintanence, Weight Managment, Physical Activity, Exercise Reviewed  Physical Activity Discussed/Reviewed Physical Activity Discussed, Physical Activity  Reviewed, Types of exercise, Home Exercise Program (HEP), PREP, Gym  Weight Management Weight loss  Education Interventions   Education Provided Provided Therapist, sports, Provided Web-based Education, Provided Education  Provided Verbal Education On Nutrition, Exercise, Mental Health/Coping with Illness, Air traffic controller, Development worker, community, [employer], When to see the Scientist, research (physical sciences) Medicaid, Personal Care Services, [place]-2  Mental Health Interventions   Mental Health Discussed/Reviewed Mental Health Discussed, Substance Abuse, Grief and Loss, Depression, Anxiety, Crisis, Coping Strategies, Mental Health Reviewed, Suicide, Other  Nutrition Interventions   Nutrition Discussed/Reviewed Nutrition Discussed, Portion sizes, Decreasing sugar intake, Nutrition Reviewed, Increasing proteins, Carbohydrate meal planning, Decreasing fats, Adding fruits and vegetables, Decreasing salt, Fluid intake  Pharmacy Interventions   Pharmacy Dicussed/Reviewed Pharmacy Topics Discussed, Pharmacy Topics Reviewed, Medications and their functions, Medication Adherence, Affording Medications  Medication Adherence --  [Medication Compliance According to Daughter/24 Hour Caregiver]  Safety Interventions   Safety Discussed/Reviewed Safety Discussed, Safety Reviewed, Fall Risk, Home Safety  Home Safety Assistive Devices, Need for home safety assessment, Refer for home visit, Contact provider for referral to PT/OT, Refer for community resources, Contact home health agency  Advanced Directive Interventions   Advanced Directives Discussed/Reviewed Advanced Directives Discussed      Active Listening & Reflection Utilized.  Verbalization of Feelings Encouraged.  Emotional Support Provided. Problem Solving Interventions Employed. Solution-Focused Strategies Implemented. Acceptance & Commitment Therapy Initiated. Cognitive Behavioral Therapy Performed. CSW Collaboration with Daughter, [name] to Confirm  Patient's Admission to [facility] on 12/02/2022 for Treatment of Generalized Weakness, Where Patient Continues to Reside for Medical Clearance. CSW Collaboration with Daughter, [name] to Confirm Patient's Tentative Discharge from [facility] on 12/05/2022, with Plan to Return Home with Home Health Physical & Occupational Therapy Services, as Well as Home Health Social Work Services, through [facility] [phone]). CSW Collaboration with Daughter, [name] to [employer]  Patient's Name on Waiting List to Receive Prepared Meal Delivery Services, 5 Days Per Week, through [facility] [place] [phone]).  CSW Collaboration with Daughter, [name] to Confirm Patient's Name on Waiting List to [facility], through [facility]ging, Disability, [facility] [place] [phone]). CSW Collaboration with Daughter, [name] to Encourage Review of [employer] in [facility] [employer] with [employer], & Engagement with Agencies of Interest, from List Provided, Emailed on 12/04/2022. CSW Collaboration with Daughter, [name] to [employer] with CSW [phone]), if She Has Questions, Needs Assistance, or If Additional Social Work Needs Are Identified Between Now & Our Next Follow-Up Outreach Call, Scheduled on 12/11/2022 at 2:00 PM.      Our next appointment is by telephone on 12/11/2022 at 2:00 pm.  Please call the care guide team at [phone] if you need to cancel or reschedule your appointment.   If you are experiencing a Mental Health or Behavioral Health Crisis or need someone to talk to, please call the Suicide and Crisis Lifeline: 988 call the [place] National Suicide Prevention Lifeline: (416)279-8543 or TTY: (716)828-5605 TTY (814)741-9446) to talk to a trained counselor call 1-800-273-TALK (toll free, 24  hour hotline) go to [facility] [address] [place] [phone]) call the [facility] Crisis Line: [phone] call 911  Patient verbalizes understanding of instructions and care plan provided today and agrees to view in MyChart. Active MyChart status and patient understanding of how to access instructions and care plan via MyChart confirmed with patient.     Telephone follow up appointment with care management team member scheduled for:   12/11/2022 at 2:00 pm.  Danford Bad, BSW, MSW, LCSW  Embedded Practice Social Work Case Manager  [facility], Population Health Direct Dial: [phone]  Fax: [phone] Email: [email or web address]: [email or web address]

## 2022-12-04 NOTE — Progress Notes (Signed)
Progress Note   Patient: [name] QIO:[id number] DOB: 10/06/1940 DOA: 12/02/2022     1 DOS: the patient was seen and examined on 12/04/2022   Brief hospital course: 82yo with h/o CAD, HFpER, COPD on 4L home O2, UC, HTN, HLD, ankylosing spondylitis, BPH, OSA on CPAP and morbid obesity who presented on 10/27 with fever to 102.7 in the setting of diverticulitis. He was started on Ceftriaxone and metronidazole.    Assessment and Plan:  Sepsis due to diverticulitis vs. PNA -Sepsis indicates life-threatening organ dysfunction with mortality >10%, caused by dysregulation to host response.   -SIRS criteria in this patient includes: Leukocytosis, fever, tachypnea -Mild lactic acidosis, resolved on repeat -Sepsis protocol initiated -Suspected source is diverticulitis vs. PNA (neither is clear on imaging) -For now, treat with Ceftriaxone and metronidazole -Blood cultures pending -Admitted to Tele -> Med Surg Developed worsening cough overnight on 10/29 and so CXR repeated and appears to have multifocal infiltrates at this time, awaiting official read -Will add azithromycin for atypical coverage, as well -That said, family reports that he is at his relative baseline and he is on his home O2 -BCID and culture positive for Staph hominis, will request ID consultation   Elevated LFTs Likely due to acute illness, improving   CT showing cholelithiasis and no acute hepatobiliary abnormality.  Trend LFTs.   CAD EKG pending Patient denies chest pain Continue aspirin, Coreg, and Imdur.   Chronic HFpEF Last echo done in December 2023 showing EF 55 to 60% No signs of volume overload Hold torsemide for now   COPD Chronic hypoxemic respiratory failure on 4 L home oxygen Stable, no signs of acute exacerbation No change in oxygen requirement from baseline Continue albuterol as needed   CKD stage IIIa Appeared to be stable at this time on presentation Slightly worse on 10/29 Attempt to  avoid nephrotoxic medications Recheck BMP in AM    Hypertension Generally controlled Continue amlodipine, Coreg, hydralazine, Imdur, and spironolactone   Hyperlipidemia Hold statin at this time and repeat LFTs in the morning.   OSA Continue nightly CPAP   Mood disorder/dementia Continue Cymbalta Delirium precautions   GOC Patient is DNR Discussed placement with daughter but she prefers home with her with [facility] assistance PT/OT/TOC team consults requested       Consultants: ID PT/OT TOC team   Procedures: None   Antibiotics: Ceftriaxone 10/27- Metronidazole 10/27-      30 Day Unplanned Readmission Risk Score    Flowsheet Row ED to Hosp-Admission (Current) from 12/02/2022 in [place] [facility]  30 Day Unplanned Readmission Risk Score (%) 13.49 Filed at 12/04/2022 0801       This score is the patient's risk of an unplanned readmission within 30 days of being discharged (0 -100%). The score is based on dignosis, age, lab data, medications, orders, and past utilization.   Low:  0-14.9   Medium: 15-21.9   High: 22-29.9   Extreme: 30 and above           Subjective: Pleasant but somnolent.  No specific complaints.  No abdominal pain.   Objective: Vitals:   12/04/22 0750 12/04/22 0859  BP: (!) 142/56   Pulse: 73   Resp: 17   Temp: 98.7 F (37.1 C)   SpO2: 90% 90%    Intake/Output Summary (Last 24 hours) at 12/04/2022 1421 Last data filed at 12/04/2022 0451 Gross per 24 hour  Intake 270.03 ml  Output 1250 ml  Net -979.97 ml  Filed Weights   12/02/22 2300  Weight: (!) 140 kg    Exam:  General:  Appears calm and comfortable and is in NAD, on 4L [place] O2 (home O2) Eyes:  EOMI, normal lids, iris ENT:  grossly normal hearing, lips & tongue, mmm Neck:  no LAD, masses or thyromegaly Cardiovascular:  RRR, no m/r/g. No LE edema.  Respiratory:   Scattered rhonchi.  Mildly increased respiratory effort. Abdomen:  soft, NT, ND Skin:  no rash or  induration seen on limited exam Musculoskeletal:   no bony abnormality, not overly mobile Psychiatric: blunted/mildly confused mood and affect, speech sparse but appropriate Neurologic:  CN 2-12 grossly intact  Data Reviewed: I have reviewed the patient's lab results since admission.  Pertinent labs for today include:  Glucose 177 BUN 23/Creatinine 1.88/GFR 35, slightly worse Procalcitonin 0.32 WBC 8.8 Hgb 12 Platelets 155 Blood culture is positive for Staph hominis   Family Communication: Son in Social worker was present throughout evaluation  Disposition: Status is: Inpatient Remains inpatient appropriate because: ongoing evaluation and treatment     Time spent: 50 minutes  Unresulted Labs (From admission, onward)     Start     Ordered   12/05/22 0500  Comprehensive metabolic panel  Tomorrow morning,   R       Question:  Specimen collection method  Answer:  Lab=Lab collect   12/04/22 1419   12/05/22 0500  CBC with Differential/Platelet  Tomorrow morning,   R       Question:  Specimen collection method  Answer:  Lab=Lab collect   12/04/22 1419   12/02/22 2303  Culture, blood (Routine x 2)  BLOOD CULTURE X 2,   R (with STAT occurrences)      12/02/22 2302             Author: Jonah Blue, MD 12/04/2022 2:21 PM  For on call review [email or web address].

## 2022-12-04 NOTE — Plan of Care (Signed)

## 2022-12-04 NOTE — TOC Initial Note (Signed)
Transition of Care [facility]) - Initial/Assessment Note    Patient Details  Name: [name] MRN: [id number] Date of Birth: [birth year]  [facility]) CM/SW Contact:    Janae Bridgeman, RN Phone Number: 12/04/2022, 1:48 PM  Clinical Narrative:                 CM met with the patient and daughters, [name] and Marylene Land at the bedside to discuss TOC needs.  The patient is recommended by PT/OT to return home with home health services.  I discussed this with the patient's family and they are agreeable for home health services.  Medicare choice regarding home health was provided to the patient's daughter and she wants the same company that provided care during last admission for home health to the home.  I called [facility] and they accepted for home health services for PT, OT, aide and MSW.  HH orders placed to be co-signed by the MD.  The patient's daughter, [name] is disabled and has difficulty caring for the patient at the home.  [facility] follows patient in the community and I spoke with Randa Evens, MSW and she is aware that patient will return home with home health services at this time.  Patient needs ramp built at the home.  MSW with [facility] will follow up in the community for assistance.  Patient does not qualify for CAPS services since patient does not qualify for Medicaid.  Family was updated.  When patient is medically stable - patient to return home by car with the daughter, [name].  Expected Discharge Plan: Home w Home Health Services Barriers to Discharge: Continued Medical Work up   Patient Goals and CMS Choice Patient states their goals for this hospitalization and ongoing recovery are:: Patient sleeping and unable to state goals CMS Medicare.gov Compare Post Acute Care list provided to:: Patient Represenative (must comment) [name], daughter) Choice offered to / list presented to : Adult Children [place] ownership interest in [facility].provided to::  Adult Children    Expected Discharge Plan and Services   Discharge Planning Services: CM Consult Post Acute Care Choice: Home Health Living arrangements for the past 2 months: Single Family Home                           HH Arranged: PT, OT, Nurse's Aide HH Agency: [facility] Date [facility] Agency Contacted: 12/04/22 Time HH Agency Contacted: 1212 Representative spoke with at [facility] Agency: Tresa Endo, CM with [facility]  Prior Living Arrangements/Services Living arrangements for the past 2 months: Single Family Home Lives with:: Adult Children Patient language and need for interpreter reviewed:: Yes Do you feel safe going back to the place where you live?: Yes      Need for Family Participation in Patient Care: Yes (Comment) Care giver support system in place?: Yes (comment) Current home services: DME Criminal Activity/Legal Involvement Pertinent to Current Situation/Hospitalization: No - Comment as needed  Activities of Daily Living   ADL Screening (condition at time of admission) Independently performs ADLs?: No Does the patient have a NEW difficulty with bathing/dressing/toileting/self-feeding that is expected to last >3 days?: No Does the patient have a NEW difficulty with getting in/out of bed, walking, or climbing stairs that is expected to last >3 days?: No Does the patient have a NEW difficulty with communication that is expected to last >3 days?: No Is the patient deaf or have difficulty hearing?: Yes Does the patient  have difficulty seeing, even when wearing glasses/contacts?: No Does the patient have difficulty concentrating, remembering, or making decisions?: No  Permission Sought/Granted Permission sought to share information with : Case Manager, Magazine features editor, Family Supports Permission granted to share information with : Yes, Verbal Permission Granted     Permission granted to share info w AGENCY: [facility] - PT, OT, aide  Permission  granted to share info w Relationship: [name], daughter     Emotional Assessment Appearance:: Appears stated age Attitude/Demeanor/Rapport: Unable to Assess Affect (typically observed): Unable to Assess   Alcohol / Substance Use: Not Applicable Psych Involvement: No (comment)  Admission diagnosis:  Diverticulitis [K57.92] Acute diverticulitis [K57.92] Sepsis with encephalopathy without septic shock, due to unspecified organism ([name]) [A41.9, R65.20, G93.41] Patient Active Problem List   Diagnosis Date Noted   Acute diverticulitis 12/03/2022   Elevated transaminase level 12/03/2022   Diverticulitis 12/03/2022   Osteopenia 05/23/2022   Vitamin D deficiency 05/23/2022   Other hyperparathyroidism ([name]) 05/08/2022   Stage 3a chronic kidney disease (CKD) ([name]) 01/19/2022   Myocardial injury 01/19/2022   Hypokalemia 01/19/2022   Hypertensive crisis 01/18/2022   Community acquired pneumonia of bilateral lower lobes and right middle lobe due to Streptoccocus pneumoniae 01/17/2022   Depression, major, single episode, moderate ([name]) 02/20/2021   Hematochezia 01/26/2021   Syncope 10/29/2020   Head concussion 10/29/2020   Bilateral hand fractures 10/29/2020   Gastroesophageal reflux disease without esophagitis 07/19/2020   Status post cervical spinal fusion 07/19/2020   Cervical spondylosis 03/07/2020   DNR (do not resuscitate) 08/17/2019   Shortness of breath 04/09/2019   COPD (chronic obstructive pulmonary disease) ([name]) 04/09/2019   Ulcerative rectosigmoiditis with rectal bleeding ([name]) 04/09/2019   Chronic respiratory failure with hypoxia ([name]) 04/09/2019   Obesity with body mass index (BMI) of 30.0 to 39.9 04/09/2019   Acute on chronic respiratory failure with hypoxia and hypercapnia ([name]) 12/23/2018   Acute on chronic hypoxic respiratory failure ([name]) 12/21/2018   Ankylosing spondylitis ([name]) 11/07/2018   Disseminated idiopathic skeletal hyperostosis 10/22/2018   Neck pain  10/22/2018   Rectal bleeding 08/26/2018   Chronic heart failure with preserved ejection fraction (HFpEF) ([name]) 03/27/2018   Exertional dyspnea 03/15/2018   Morbid obesity ([name]) 03/12/2018   History of carpal tunnel surgery of left wrist with unlar head resection 01/17/17 01/24/2017   Closed fracture of distal radius and ulna, left, with malunion, subsequent encounter    Carpal tunnel syndrome of left wrist    Fracture, [place], left, closed 11/29/2016   Hx of skin cancer, basal cell 11/30/2015   Erectile dysfunction 06/06/2015   Iron deficiency 08/18/2014   Fecal incontinence 02/10/2014   Essential hypertension 01/09/2013   Dyslipidemia, goal LDL below 70 01/09/2013   Chest pain at rest 12/14/2012   CAD (coronary artery disease) 12/14/2012   Hiatal hernia 12/14/2012   GERD (gastroesophageal reflux disease) 12/14/2012   Prediabetes 12/09/2012   BPH (benign prostatic hyperplasia) 11/18/2012   RUQ pain 09/04/2012   Gallstones 05/09/2012   OSA treated with BiPAP 04/30/2012   Ulcerative colitis ([name]) 04/29/2012   Ulcerative colitis, left sided ([name]) 12/26/2011   Diarrhea 10/18/2010   PCP:  Babs Sciara, MD Pharmacy:   [facility] [phone] - [place], [place] - [address] [place] [address] Phone: [phone] Fax: [phone]     Social Determinants of Health (SDOH) Social History: SDOH Screenings   Food Insecurity:  No Food Insecurity (12/03/2022)  Recent Concern: Food Insecurity - Food Insecurity Present (09/10/2022)  Housing: Low Risk  (12/03/2022)  Recent Concern: Housing - Medium Risk (09/10/2022)  Transportation Needs: No Transportation Needs (12/03/2022)  Utilities: Not At Risk (12/03/2022)  Alcohol Screen: Low Risk  (02/20/2022)  Depression (PHQ2-9): High Risk (09/10/2022)  Financial Resource Strain: High Risk (09/10/2022)  Physical Activity: Inactive (09/10/2022)  Social Connections: Moderately  Isolated (09/10/2022)  Stress: Stress Concern Present (09/10/2022)  Tobacco Use: Medium Risk (12/02/2022)   SDOH Interventions: Food Insecurity Interventions: Intervention Not Indicated Transportation Interventions: Intervention Not Indicated Utilities Interventions: Intervention Not Indicated   Readmission Risk Interventions    12/04/2022   12:13 PM  Readmission Risk Prevention Plan  Post Dischage Appt Complete  Medication Screening Complete  Transportation Screening Complete

## 2022-12-04 NOTE — Evaluation (Signed)
Occupational Therapy Evaluation Patient Details Name: [name] MRN: [id number] DOB: [birth year] Today's Date: 12/04/2022   History of Present Illness 82 yo male admitted 10/27 for generalized weakness, sepsis, acute diverticulitis. PMH: CAD, HFpEF, COPD, chronic hypoxemic respiratory failure on 4L home O2, ulcerative colitis, diverticulosis, GERD, HTN, HLD, nephrolithiasis, ankylosing spondylitis, BPH, OSA, osteopenia   Clinical Impression   [name] was evaluated s/p the above admission list. He uses a rollator and family assists with ADLs as needed at baseline. Upon evaluation the pt was limited by generalized weakness, fatigue and poor activity tolernace. Overall he needed CGA for transfers due to a LOB upon standing and supervision A for short mobility. Due to the deficits listed below the pt also needs up to min A for LB ADLs and set up A for UB ADL. Pt will benefit from continued acute OT services and HHOT.        If plan is discharge home, recommend the following: A little help with walking and/or transfers;A little help with bathing/dressing/bathroom;Assistance with cooking/housework;Assist for transportation;Help with stairs or ramp for entrance    Functional Status Assessment  Patient has had a recent decline in their functional status and demonstrates the ability to make significant improvements in function in a reasonable and predictable amount of time.  Equipment Recommendations  None recommended by OT       Precautions / Restrictions Precautions Precautions: Fall;Other (comment) Precaution Comments: 4L at baseline Restrictions Weight Bearing Restrictions: No      Mobility Bed Mobility Overal bed mobility: Needs Assistance Bed Mobility: Sit to Supine       Sit to supine: Supervision        Transfers Overall transfer level: Needs assistance Equipment used: None Transfers: Sit to/from Stand Sit to Stand: Contact guard assist           General  transfer comment: LOB upon standing, pt able to self correct      Balance Overall balance assessment: Needs assistance Sitting-balance support: Feet supported, No upper extremity supported Sitting balance-Leahy Scale: Good     Standing balance support: Single extremity supported, During functional activity Standing balance-Leahy Scale: Fair                             ADL either performed or assessed with clinical judgement   ADL Overall ADL's : Needs assistance/impaired Eating/Feeding: Independent   Grooming: Supervision/safety;Standing   Upper Body Bathing: Set up;Sitting   Lower Body Bathing: Sit to/from stand;Minimal assistance   Upper Body Dressing : Set up;Sitting   Lower Body Dressing: Sit to/from stand;Minimal assistance   Toilet Transfer: Ambulation;Contact guard assist   Toileting- Clothing Manipulation and Hygiene: Supervision/safety;Sitting/lateral lean       Functional mobility during ADLs: Contact guard assist General ADL Comments: pt had LOB upon standing and was unable to tolerate significant activity after long walk in hallway with PT - limited by activity tolernace     Vision Baseline Vision/History: 0 No visual deficits Vision Assessment?: No apparent visual deficits     Perception Perception: Within Functional Limits       Praxis Praxis: WFL       Pertinent Vitals/Pain Pain Assessment Pain Assessment: No/denies pain     Extremity/Trunk Assessment Upper Extremity Assessment Upper Extremity Assessment: Generalized weakness   Lower Extremity Assessment Lower Extremity Assessment: Defer to PT evaluation   Cervical / Trunk Assessment Cervical / Trunk Assessment: Kyphotic   Communication Communication Communication: Hearing impairment  Cueing Techniques: Verbal cues   Cognition Arousal: Alert Behavior During Therapy: WFL for tasks assessed/performed Overall Cognitive Status: Within Functional Limits for tasks assessed              General Comments  VSS on RA, son in law present     Home Living Family/patient expects to be discharged to:: Private residence Living Arrangements: Children Available Help at Discharge: Family;Available 24 hours/day Type of Home: House Home Access: Stairs to enter [employer] of Steps: 2 Entrance Stairs-Rails: Left Home Layout: One level     Bathroom Shower/Tub: Producer, television/film/video: Standard Bathroom Accessibility: Yes   Home Equipment: Shower seat - built Charity fundraiser (2 wheels);Rollator (4 wheels);Wheelchair - manual   Additional Comments: PCA 4 hours a day 6 days a week      Prior Functioning/Environment Prior Level of Function : Needs assist             Mobility Comments: Uses the rollator in the house ADLs Comments: assisted with showers, dressing for lower body/back by daughter. family does the IADLs        OT Problem List: Decreased activity tolerance;Impaired balance (sitting and/or standing)      OT Treatment/Interventions: Self-care/ADL training;Therapeutic exercise;DME and/or AE instruction;Therapeutic activities;Patient/family education    OT Goals(Current goals can be found in the care plan section) Acute Rehab OT Goals Patient Stated Goal: to get stronger OT Goal Formulation: With patient Time For Goal Achievement: 12/18/22 Potential to Achieve Goals: Good ADL Goals Pt Will Perform Grooming: with modified independence;standing Pt Will Perform Lower Body Dressing: with modified independence;sit to/from stand Pt Will Transfer to Toilet: with modified independence;ambulating;regular height toilet Additional ADL Goal #1: Pt will indep recall at least 3 energy conservation strategies to apply at discharge Additional ADL Goal #2: Pt will tolerate at least 8 minutes of OOB activity without sitting rest break to demonstrate increased tolerance for ADLs at discharge  OT Frequency: Min 1X/week       AM-PAC OT "6  Clicks" Daily Activity     Outcome Measure Help from another person eating meals?: None Help from another person taking care of personal grooming?: A Little Help from another person toileting, which includes using toliet, bedpan, or urinal?: A Little Help from another person bathing (including washing, rinsing, drying)?: A Little Help from another person to put on and taking off regular upper body clothing?: A Little Help from another person to put on and taking off regular lower body clothing?: A Little 6 Click Score: 19   End of Session Equipment Utilized During Treatment: Oxygen Nurse Communication: Mobility status  Activity Tolerance: Patient tolerated treatment well Patient left: in bed;with call bell/phone within reach;with family/visitor present  OT Visit Diagnosis: Unsteadiness on feet (R26.81);Other abnormalities of gait and mobility (R26.89);Muscle weakness (generalized) (M62.81)                Time: 1010-1031 OT Time Calculation (min): 21 min Charges:  OT Evaluation $OT Eval Moderate Complexity: 1 Mod  Derenda Mis, OTR/L Acute Rehabilitation Services Office [phone] Secure Chat Communication Preferred   Donia Pounds 12/04/2022, 10:51 AM

## 2022-12-04 NOTE — Patient Outreach (Signed)
Care Coordination   Follow Up Visit Note   12/04/2022  Name: [name] MRN: [id number] DOB: 03/17/1940  [name] is a 82 y.o. year old male who sees Luking, [name], MD for primary care. I spoke with patient's daughter, [name] by phone today.  What matters to the patients health and wellness today?  Receive Assistance Obtaining In-Home Care Services.    Goals Addressed             This Visit's Progress    Receive Assistance Obtaining In-Home Care Services.   On track    Care Coordination Interventions:  Interventions Today    Flowsheet Row Most Recent Value  Chronic Disease   Chronic disease during today's visit Chronic Obstructive Pulmonary Disease (COPD), Congestive Heart Failure (CHF), Hypertension (HTN), Chronic Kidney Disease/End Stage Renal Disease (ESRD), Other  [Morbid Obesity, Major Depression, Single Episode, Moderate, Prediabetes, Daughter with Caregiver Burnout/Stress/Fatigue, Inability to Perform Activities of Daily Living Independently, Ulcerative Collitus, Memory Deficits]  General Interventions   General Interventions Discussed/Reviewed General Interventions Discussed, Durable Medical Equipment (DME), Communication with, Level of Care, General Interventions Reviewed, [employer], Labs  Labs Hgb A1c every 3 months, Kidney Function  Doctor Visits Discussed/Reviewed Doctor Visits Discussed, Doctor Visits Reviewed, Annual Wellness Visits, PCP, Database administrator (DME) Bed side commode, BP Cuff, Environmental consultant, Wheelchair, Tour manager, Other  [Hand-Held Chief Strategy Officer, Incontinence Pads & Depends]  Wheelchair Standard  PCP/Specialist Visits Compliance with follow-up visit  Communication with PCP/Specialists, RN  Level of Care Adult Daycare, Applications, Assisted Living, Skilled Nursing Facility, Teaching laboratory technician Medicaid, Personal Care Services, [place]-2  Exercise Interventions   Exercise Discussed/Reviewed  Exercise Discussed, Assistive device use and maintanence, Weight Managment, Physical Activity, Exercise Reviewed  Physical Activity Discussed/Reviewed Physical Activity Discussed, Physical Activity Reviewed, Types of exercise, Home Exercise Program (HEP), PREP, Gym  Weight Management Weight loss  Education Interventions   Education Provided Provided Therapist, sports, Provided Web-based Education, Provided Education  Provided Verbal Education On Nutrition, Exercise, Mental Health/Coping with Illness, Air traffic controller, Development worker, community, [employer], When to see the Scientist, research (physical sciences) Medicaid, Personal Care Services, [place]-2  Mental Health Interventions   Mental Health Discussed/Reviewed Mental Health Discussed, Substance Abuse, Grief and Loss, Depression, Anxiety, Crisis, Coping Strategies, Mental Health Reviewed, Suicide, Other  Nutrition Interventions   Nutrition Discussed/Reviewed Nutrition Discussed, Portion sizes, Decreasing sugar intake, Nutrition Reviewed, Increasing proteins, Carbohydrate meal planning, Decreasing fats, Adding fruits and vegetables, Decreasing salt, Fluid intake  Pharmacy Interventions   Pharmacy Dicussed/Reviewed Pharmacy Topics Discussed, Pharmacy Topics Reviewed, Medications and their functions, Medication Adherence, Affording Medications  Medication Adherence --  [Medication Compliance According to Daughter/24 Hour Caregiver]  Safety Interventions   Safety Discussed/Reviewed Safety Discussed, Safety Reviewed, Fall Risk, Home Safety  Home Safety Assistive Devices, Need for home safety assessment, Refer for home visit, Contact provider for referral to PT/OT, Refer for community resources, Contact home health agency  Advanced Directive Interventions   Advanced Directives Discussed/Reviewed Advanced Directives Discussed      Active Listening & Reflection Utilized.  Verbalization of Feelings Encouraged.  Emotional Support Provided. Problem Solving Interventions  Employed. Solution-Focused Strategies Implemented. Acceptance & Commitment Therapy Initiated. Cognitive Behavioral Therapy Performed. CSW Collaboration with Daughter, [name] to Confirm Patient's Admission to [facility] on 12/02/2022 for Treatment of Generalized Weakness, Where Patient Continues to Reside for Medical Clearance. CSW Collaboration with Daughter, [name] to Confirm Patient's Tentative Discharge from [facility] on 12/05/2022, with Plan to  Return Home with Home Health Physical & Occupational Therapy Services, as Well as Home Health Social Work Services, through [employer] [phone]). CSW Collaboration with Daughter, [name] to Confirm Patient's Name on Waiting List to Receive Prepared Meal Delivery Services, 5 Days Per Week, through [facility] [place] [phone]).  CSW Collaboration with Daughter, [name] to Confirm Patient's Name on Waiting List to [facility], through [facility] [place] [phone]). CSW Collaboration with Daughter, [name] to Encourage Review of [employer] in [facility] [employer] with [employer], & Engagement with Agencies of Interest, from List Provided, Emailed on 12/04/2022. CSW Collaboration with Daughter, [name] to [employer] with CSW [phone]), if She Has Questions, Needs Assistance, or If Additional Social Work Needs Are Identified Between Now & Our Next Follow-Up Outreach Call, Scheduled on 12/11/2022 at 2:00 PM.      SDOH assessments and interventions completed:  Yes.  Care Coordination Interventions:  Yes, provided.   Follow up plan: Follow up call scheduled for 12/11/2022 at 2:00 pm.  Encounter Outcome:  Patient Visit Completed.   Danford Bad, BSW, MSW, Printmaker Social Work Case  Set designer Health  [facility], Population Health Direct Dial: [phone]  Fax: [phone] Email: [email or web address]: [email or web address]

## 2022-12-04 NOTE — Evaluation (Signed)
Physical Therapy Evaluation Patient Details Name: [name] MRN: [id number] DOB: [birth year] Today's Date: 12/04/2022  History of Present Illness  82 yo male admitted 10/27 for generalized weakness, sepsis, acute diverticulitis. PMH: CAD, HFpEF, COPD, chronic hypoxemic respiratory failure on 4L home O2, ulcerative colitis, diverticulosis, GERD, HTN, HLD, nephrolithiasis, ankylosing spondylitis, BPH, OSA, osteopenia  Clinical Impression  Pt pleasant, in chair on arrival with son-in-law present. Pt lives with daughter, uses rollator and has assist for ADLs. Pt currently able to walk in hall with cues for safety and reliant on 4L O2. Pt reports progressive weakness with desire to get stronger and family interested in HHPT which is appropriate. Pt with decreased activity tolerance and function who will benefit from acute therapy to maximize mobility and strength.         If plan is discharge home, recommend the following: A little help with bathing/dressing/bathroom;Assistance with cooking/housework;Assist for transportation;Help with stairs or ramp for entrance   Can travel by private vehicle        Equipment Recommendations None recommended by PT  Recommendations for Other Services       Functional Status Assessment Patient has had a recent decline in their functional status and demonstrates the ability to make significant improvements in function in a reasonable and predictable amount of time.     Precautions / Restrictions Precautions Precautions: Fall;Other (comment) Precaution Comments: O2      Mobility  Bed Mobility               General bed mobility comments: in chair on arrival and end of session    Transfers Overall transfer level: Needs assistance   Transfers: Sit to/from Stand Sit to Stand: Supervision           General transfer comment: mod I to stand from recliner, supervision to sit and rise from rollator with cues for use of brakes     Ambulation/Gait Ambulation/Gait assistance: Modified independent (Device/Increase time) Gait Distance (Feet): 80 Feet Assistive device: Rollator (4 wheels) Gait Pattern/deviations: Step-through pattern, Decreased stride length, Trunk flexed   Gait velocity interpretation: 1.31 - 2.62 ft/sec, indicative of limited community ambulator   General Gait Details: pt with kyphotic posture, reliant on rollator. walked 80', seated rest then additional 40' to return to room. 90% on 4L with activity  Stairs            Wheelchair Mobility     Tilt Bed    Modified Rankin (Stroke Patients Only)       Balance Overall balance assessment: Needs assistance Sitting-balance support: Feet supported, No upper extremity supported Sitting balance-Leahy Scale: Fair     Standing balance support: Bilateral upper extremity supported, During functional activity, Reliant on assistive device for balance Standing balance-Leahy Scale: Poor Standing balance comment: rollator in standing                             Pertinent Vitals/Pain Pain Assessment Pain Assessment: No/denies pain    Home Living Family/patient expects to be discharged to:: Private residence Living Arrangements: Children Available Help at Discharge: Family;Available 24 hours/day Type of Home: House Home Access: Stairs to enter   [employer] of Steps: 2   Home Layout: One level Home Equipment: Shower seat - built Charity fundraiser (2 wheels);Rollator (4 wheels);Wheelchair - manual      Prior Function Prior Level of Function : Needs assist  Mobility Comments: Uses the rollator in the house ADLs Comments: assisted with showers, dressing for lower body/back by daughter. family does the IADLs     Extremity/Trunk Assessment   Upper Extremity Assessment Upper Extremity Assessment: Generalized weakness    Lower Extremity Assessment Lower Extremity Assessment: Generalized weakness     Cervical / Trunk Assessment Cervical / Trunk Assessment: Kyphotic  Communication   Communication Communication: Hearing impairment Cueing Techniques: Verbal cues  Cognition Arousal: Alert Behavior During Therapy: WFL for tasks assessed/performed Overall Cognitive Status: Within Functional Limits for tasks assessed                                          General Comments      Exercises     Assessment/Plan    PT Assessment Patient needs continued PT services  PT Problem List Decreased activity tolerance;Decreased mobility       PT Treatment Interventions DME instruction;Gait training;Therapeutic exercise;Stair training;Therapeutic activities;Functional mobility training;Patient/family education    PT Goals (Current goals can be found in the Care Plan section)  Acute Rehab PT Goals Patient Stated Goal: go home, get a riding mower PT Goal Formulation: With patient/family Time For Goal Achievement: 12/18/22 Potential to Achieve Goals: Fair    Frequency Min 1X/week     Co-evaluation               AM-PAC PT "6 Clicks" Mobility  Outcome Measure Help needed turning from your back to your side while in a flat bed without using bedrails?: A Little Help needed moving from lying on your back to sitting on the side of a flat bed without using bedrails?: A Little Help needed moving to and from a bed to a chair (including a wheelchair)?: A Little Help needed standing up from a chair using your arms (e.g., wheelchair or bedside chair)?: A Little Help needed to walk in hospital room?: None Help needed climbing 3-5 steps with a railing? : A Little 6 Click Score: 19    End of Session Equipment Utilized During Treatment: Gait belt;Oxygen Activity Tolerance: Patient tolerated treatment well Patient left: in chair;with call bell/phone within reach;with chair alarm set;with family/visitor present Nurse Communication: Mobility status PT Visit Diagnosis: Other  abnormalities of gait and mobility (R26.89);Muscle weakness (generalized) (M62.81)    Time: 6387-5643 PT Time Calculation (min) (ACUTE ONLY): 26 min   Charges:   PT Evaluation $PT Eval Moderate Complexity: 1 Mod PT Treatments $Therapeutic Activity: 8-22 mins PT General Charges $$ ACUTE PT VISIT: 1 Visit         Merryl Hacker, PT Acute Rehabilitation Services Office: [phone]   Arlicia Paquette B Nialah Saravia 12/04/2022, 9:07 AM

## 2022-12-05 DIAGNOSIS — A411 Sepsis due to other specified staphylococcus: Secondary | ICD-10-CM | POA: Diagnosis not present

## 2022-12-05 MED ORDER — ARFORMOTEROL TARTRATE 15 MCG/2ML IN NEBU
15.0000 ug | INHALATION_SOLUTION | Freq: Two times a day (BID) | RESPIRATORY_TRACT | Status: DC
  Administered 2022-12-06: 15 ug via RESPIRATORY_TRACT
  Filled 2022-12-05 (×3): qty 2

## 2022-12-05 MED ORDER — METOPROLOL TARTRATE 5 MG/5ML IV SOLN: 5.0000 mg | INTRAVENOUS | Status: DC | PRN

## 2022-12-05 MED ORDER — PRAVASTATIN SODIUM 40 MG PO TABS
40.0000 mg | ORAL_TABLET | Freq: Every day | ORAL | Status: DC
  Administered 2022-12-05: 40 mg via ORAL
  Filled 2022-12-05: qty 1

## 2022-12-05 MED ORDER — METRONIDAZOLE 500 MG PO TABS: 500.0000 mg | ORAL_TABLET | Freq: Two times a day (BID) | ORAL | Status: DC

## 2022-12-05 MED ORDER — REVEFENACIN 175 MCG/3ML IN SOLN
175.0000 ug | Freq: Every day | RESPIRATORY_TRACT | Status: DC
  Administered 2022-12-06: 175 ug via RESPIRATORY_TRACT
  Filled 2022-12-05 (×2): qty 3

## 2022-12-05 MED ORDER — TRAZODONE HCL 50 MG PO TABS: 50.0000 mg | ORAL_TABLET | Freq: Every evening | ORAL | Status: DC | PRN

## 2022-12-05 MED ORDER — SENNOSIDES-DOCUSATE SODIUM 8.6-50 MG PO TABS: 1.0000 | ORAL_TABLET | Freq: Every evening | ORAL | Status: DC | PRN

## 2022-12-05 MED ORDER — AZITHROMYCIN 500 MG PO TABS
250.0000 mg | ORAL_TABLET | Freq: Every day | ORAL | Status: DC
  Administered 2022-12-05 – 2022-12-06 (×2): 250 mg via ORAL
  Filled 2022-12-05 (×2): qty 1

## 2022-12-05 MED ORDER — ONDANSETRON HCL 4 MG/2ML IJ SOLN: 4.0000 mg | Freq: Four times a day (QID) | INTRAMUSCULAR | Status: DC | PRN

## 2022-12-05 MED ORDER — AMOXICILLIN-POT CLAVULANATE 875-125 MG PO TABS
1.0000 | ORAL_TABLET | Freq: Two times a day (BID) | ORAL | Status: DC
  Administered 2022-12-05 – 2022-12-06 (×3): 1 via ORAL
  Filled 2022-12-05 (×3): qty 1

## 2022-12-05 MED ORDER — HYDRALAZINE HCL 20 MG/ML IJ SOLN: 10.0000 mg | INTRAMUSCULAR | Status: DC | PRN

## 2022-12-05 MED ORDER — IPRATROPIUM-ALBUTEROL 0.5-2.5 (3) MG/3ML IN SOLN: 3.0000 mL | RESPIRATORY_TRACT | Status: DC | PRN

## 2022-12-05 NOTE — Progress Notes (Signed)
PROGRESS NOTE    [name]  WUJ:[id number] DOB: 1940/09/20 DOA: 12/02/2022 PCP: Babs Sciara, MD     Brief hospital course: [id number] with h/o CAD, HFpER, COPD on 4L home O2, UC, HTN, HLD, ankylosing spondylitis, BPH, OSA on CPAP and morbid obesity who presented on 10/27 with fever to 102.7 in the setting of diverticulitis. He was started on Ceftriaxone and metronidazole.  Eventually due to concerns of pneumonia azithromycin was added.  Blood cultures positive for Staph hominis, ID was consulted.   Assessment and Plan:   Sepsis secondary to diverticulitis with possible pneumonia Strep hominis bacteremia Sepsis physiology slowly improving.  Monitor culture data.  I suspect this is a contaminated therefore we will hold off on further treatment.  Procalcitonin 0.3, BNP 125. - Current antibiotics-Rocephin/Flagyl/azithromycin > Switch to PO Aug/Azi for now.  -Bronchodilators, I-S/flutter valve.  Add scheduled bronchodilators as well.   Elevated LFTs, improved Improved   CAD Chest pain-free.  Continue aspirin, Coreg, Imdur, statin   Chronic HFpEF Last echo done in December 2023 showing EF 55 to 60% Appears euvolemic.  Torsemide on hold   COPD with chronic hypoxia 4 L nasal cannula Continue bronchodilators   AKI on CKD stage IIIa Bilateral renal stones, nonobstructive Baseline creatinine 1.4.  Slowly trending up.  Today 1.88 Recommend oral hydration   Hypertension Norvasc, Coreg, Imdur   Hyperlipidemia We will resume statin   OSA Continue nightly CPAP   Mood disorder/dementia Delirium precaution.  Cymbalta   GOC Patient is DNR confirmed by previous provider  PT/OT= f37f done   DVT ppx: Lovenox Code: DNR Family Communication: Daughter at bedside Disposition: Status is: Inpatient Remains inpatient appropriate because: ongoing evaluation and treatment Hopefully we can transition home once renal function has slightly improved and stabilized            Subjective: Seen and examined at bedside.  Does not have any new complaints.   Examination:  General exam: Appears calm and comfortable, on chronic 4 L nasal cannula Respiratory system: Bilateral diffuse rhonchi Cardiovascular system: S1 & S2 heard, RRR. No JVD, murmurs, rubs, gallops or clicks. No pedal edema. Gastrointestinal system: Abdomen is nondistended, soft and nontender. No organomegaly or masses felt. Normal bowel sounds heard. Central nervous system: Alert and oriented. No focal neurological deficits. Extremities: Symmetric 5 x 5 power. Skin: No rashes, lesions or ulcers Psychiatry: Judgement and insight appear normal. Mood & affect appropriate.       Diet Orders (From admission, onward)     Start     Ordered   12/03/22 0305  Diet Heart Room service appropriate? Yes; Fluid consistency: Thin  Diet effective now       Question Answer Comment  Room service appropriate? Yes   Fluid consistency: Thin      12/03/22 0307            Objective: Vitals:   12/04/22 1513 12/04/22 2016 12/05/22 0429 12/05/22 0838  BP: (!) 131/44 136/62 (!) 149/58 (!) 147/48  Pulse: 70 77 (!) 59 (!) 57  Resp: 17 16 18 16   Temp: 98.2 F (36.8 C) 98.5 F (36.9 C) 98 F (36.7 C) 97.9 F (36.6 C)  TempSrc: Oral Oral  Oral  SpO2: 94% 93% 95% 92%  Weight:      Height:        Intake/Output Summary (Last 24 hours) at 12/05/2022 0941 Last data filed at 12/05/2022 0459 Gross per 24 hour  Intake 350 ml  Output 2000 ml  Net -  1650 ml   Filed Weights   12/02/22 2300  Weight: (!) 140 kg    Scheduled Meds:  amLODipine  5 mg Oral Daily   amoxicillin-clavulanate  1 tablet Oral Q12H   arformoterol  15 mcg Nebulization BID   aspirin EC  81 mg Oral Daily   azithromycin  250 mg Oral Daily   carvedilol  12.5 mg Oral BID WC   DULoxetine  60 mg Oral Daily   enoxaparin (LOVENOX) injection  70 mg Subcutaneous Q24H   hydrALAZINE  50 mg Oral TID   isosorbide mononitrate   90 mg Oral Daily   pravastatin  40 mg Oral q1800   revefenacin  175 mcg Nebulization Daily   Continuous Infusions:  Nutritional status     Body mass index is 40.72 kg/m.  Data Reviewed:   CBC: Recent Labs  Lab 12/02/22 2303 12/03/22 1042 12/04/22 1005  WBC 10.8* 10.7* 8.8  NEUTROABS 8.1*  --  5.8  HGB 13.2 11.9* 12.0*  HCT 41.0 36.3* 37.5*  MCV 93.4 93.1 93.5  PLT 179 167 155   Basic Metabolic Panel: Recent Labs  Lab 12/02/22 2303 12/03/22 1042 12/04/22 1005  NA 137 135 137  K 4.1 3.6 3.8  CL 101 104 104  CO2 24 24 29   GLUCOSE 166* 146* 177*  BUN 20 19 23   CREATININE 1.53* 1.49* 1.88*  CALCIUM 10.4* 9.2 9.7   GFR: Estimated Creatinine Clearance: 43 mL/min (A) (by C-G formula based on SCr of 1.88 mg/dL (H)). Liver Function Tests: Recent Labs  Lab 12/02/22 2303 12/03/22 1042  AST 75* 50*  ALT 55* 43  ALKPHOS 70 57  BILITOT 0.3 0.4  PROT 7.5 6.5  ALBUMIN 3.6 3.0*   No results for input(s): "LIPASE", "AMYLASE" in the last 168 hours. No results for input(s): "AMMONIA" in the last 168 hours. Coagulation Profile: Recent Labs  Lab 12/02/22 2303  INR 1.0   Cardiac Enzymes: No results for input(s): "CKTOTAL", "CKMB", "CKMBINDEX", "TROPONINI" in the last 168 hours. BNP (last 3 results) No results for input(s): "PROBNP" in the last 8760 hours. HbA1C: No results for input(s): "HGBA1C" in the last 72 hours. CBG: No results for input(s): "GLUCAP" in the last 168 hours. Lipid Profile: No results for input(s): "CHOL", "HDL", "LDLCALC", "TRIG", "CHOLHDL", "LDLDIRECT" in the last 72 hours. Thyroid Function Tests: No results for input(s): "TSH", "T4TOTAL", "FREET4", "T3FREE", "THYROIDAB" in the last 72 hours. Anemia Panel: No results for input(s): "VITAMINB12", "FOLATE", "FERRITIN", "TIBC", "IRON", "RETICCTPCT" in the last 72 hours. Sepsis Labs: Recent Labs  Lab 12/02/22 2335 12/03/22 0150 12/04/22 1005  PROCALCITON  --   --  0.32  LATICACIDVEN 2.0*  1.6  --     Recent Results (from the past 240 hour(s))  Culture, blood (Routine x 2)     Status: None (Preliminary result)   Collection Time: 12/02/22 11:20 PM   Specimen: BLOOD  Result Value Ref Range Status   Specimen Description BLOOD SITE NOT SPECIFIED  Final   Special Requests   Final    BOTTLES DRAWN AEROBIC AND ANAEROBIC Blood Culture results may not be optimal due to an excessive volume of blood received in culture bottles   Culture   Final    NO GROWTH 2 DAYS Performed at [facility], [address] [place], [place] [address]    Report Status PENDING  Incomplete  Resp panel by RT-PCR (RSV, Flu A&B, Covid) Anterior Nasal Swab     Status: None  Collection Time: 12/02/22 11:54 PM   Specimen: Anterior Nasal Swab  Result Value Ref Range Status   SARS Coronavirus 2 by RT PCR NEGATIVE NEGATIVE Final   Influenza A by PCR NEGATIVE NEGATIVE Final   Influenza B by PCR NEGATIVE NEGATIVE Final    Comment: (NOTE) The Xpert Xpress SARS-CoV-2/FLU/RSV plus assay is intended as an aid in the diagnosis of influenza from Nasopharyngeal swab specimens and should not be used as a sole basis for treatment. Nasal washings and aspirates are unacceptable for Xpert Xpress SARS-CoV-2/FLU/RSV testing.  Fact Sheet for Patients: BloggerCourse.com  Fact Sheet for Healthcare Providers: SeriousBroker.it  This test is not yet approved or cleared by the [place] FDA and has been authorized for detection and/or diagnosis of SARS-CoV-2 by FDA under an Emergency Use Authorization (EUA). This EUA will remain in effect (meaning this test can be used) for the duration of the COVID-19 declaration under Section 564(b)(1) of the Act, 21 U.S.C. section 360bbb-3(b)(1), unless the authorization is terminated or revoked.     Resp Syncytial Virus by PCR NEGATIVE NEGATIVE Final    Comment: (NOTE) Fact Sheet for  Patients: BloggerCourse.com  Fact Sheet for Healthcare Providers: SeriousBroker.it  This test is not yet approved or cleared by the [place] FDA and has been authorized for detection and/or diagnosis of SARS-CoV-2 by FDA under an Emergency Use Authorization (EUA). This EUA will remain in effect (meaning this test can be used) for the duration of the COVID-19 declaration under Section 564(b)(1) of the Act, 21 U.S.C. section 360bbb-3(b)(1), unless the authorization is terminated or revoked.  Performed at [facility], [address], [place], [place] [address]   Culture, blood (Routine X 2) w Reflex to ID Panel     Status: Abnormal   Collection Time: 12/02/22 11:55 PM   Specimen: BLOOD  Result Value Ref Range Status   Specimen Description BLOOD RIGHT ANTECUBITAL  Final   Special Requests   Final    BOTTLES DRAWN AEROBIC AND ANAEROBIC Blood Culture adequate volume   Culture  Setup Time   Final    GRAM POSITIVE COCCI IN CLUSTERS AEROBIC BOTTLE ONLY CRITICAL RESULT CALLED TO, READ BACK BY AND VERIFIED WITH: PHARMD EMMA PAYTES ON 12/03/22 @ 2003 BY DRT    Culture (A)  Final    STAPHYLOCOCCUS HOMINIS THE SIGNIFICANCE OF ISOLATING THIS ORGANISM FROM A SINGLE SET OF BLOOD CULTURES WHEN MULTIPLE SETS ARE DRAWN IS UNCERTAIN. PLEASE NOTIFY THE MICROBIOLOGY DEPARTMENT WITHIN ONE WEEK IF SPECIATION AND SENSITIVITIES ARE REQUIRED. Performed at [facility], [address] [place], [place] [address]    Report Status 12/04/2022 FINAL  Final  Blood Culture ID Panel (Reflexed)     Status: Abnormal   Collection Time: 12/02/22 11:55 PM  Result Value Ref Range Status   Enterococcus faecalis NOT DETECTED NOT DETECTED Final   Enterococcus Faecium NOT DETECTED NOT DETECTED Final   Listeria monocytogenes NOT DETECTED NOT DETECTED Final   Staphylococcus species DETECTED (A) NOT DETECTED Final    Comment: CRITICAL RESULT CALLED TO, READ  BACK BY AND VERIFIED WITH: PHARMD EMMA PAYTES ON 12/03/22 @ 2003 BY DRT    Staphylococcus aureus (BCID) NOT DETECTED NOT DETECTED Final   Staphylococcus epidermidis NOT DETECTED NOT DETECTED Final   Staphylococcus lugdunensis NOT DETECTED NOT DETECTED Final   Streptococcus species NOT DETECTED NOT DETECTED Final   Streptococcus agalactiae NOT DETECTED NOT DETECTED Final   Streptococcus pneumoniae NOT DETECTED NOT DETECTED Final   Streptococcus  pyogenes NOT DETECTED NOT DETECTED Final   A.calcoaceticus-baumannii NOT DETECTED NOT DETECTED Final   Bacteroides fragilis NOT DETECTED NOT DETECTED Final   Enterobacterales NOT DETECTED NOT DETECTED Final   Enterobacter cloacae complex NOT DETECTED NOT DETECTED Final   Escherichia coli NOT DETECTED NOT DETECTED Final   Klebsiella aerogenes NOT DETECTED NOT DETECTED Final   Klebsiella oxytoca NOT DETECTED NOT DETECTED Final   Klebsiella pneumoniae NOT DETECTED NOT DETECTED Final   Proteus species NOT DETECTED NOT DETECTED Final   Salmonella species NOT DETECTED NOT DETECTED Final   Serratia marcescens NOT DETECTED NOT DETECTED Final   Haemophilus influenzae NOT DETECTED NOT DETECTED Final   Neisseria meningitidis NOT DETECTED NOT DETECTED Final   Pseudomonas aeruginosa NOT DETECTED NOT DETECTED Final   Stenotrophomonas maltophilia NOT DETECTED NOT DETECTED Final   Candida albicans NOT DETECTED NOT DETECTED Final   Candida auris NOT DETECTED NOT DETECTED Final   Candida glabrata NOT DETECTED NOT DETECTED Final   Candida krusei NOT DETECTED NOT DETECTED Final   Candida parapsilosis NOT DETECTED NOT DETECTED Final   Candida tropicalis NOT DETECTED NOT DETECTED Final   Cryptococcus neoformans/gattii NOT DETECTED NOT DETECTED Final    Comment: Performed at [facility], [address] [place], [place] [address]         Radiology Studies: DG CHEST PORT 1 VIEW  Result Date: 12/04/2022 CLINICAL DATA:  Cough. EXAM: PORTABLE CHEST 1  VIEW COMPARISON:  Chest x-ray October 27, 24. FINDINGS: Chronic enlargement the cardiac silhouette. Mild streaky bibasilar opacities. No visible pleural effusions or pneumothorax. Polyarticular degenerative change. Aortic atherosclerosis. IMPRESSION: 1. Mild streaky bibasilar opacities which could represent atelectasis, aspiration, and/or pneumonia. 2. Chronic cardiomegaly. Electronically Signed   By: Feliberto Harts M.D.   On: 12/04/2022 14:38           LOS: 2 days   Time spent= 35 mins    Miguel Rota, MD [facility]  If 7PM-7AM, please contact night-coverage  12/05/2022, 9:41 AM

## 2022-12-05 NOTE — Plan of Care (Signed)
  Problem: Clinical Measurements: Goal: Cardiovascular complication will be avoided Outcome: Progressing   Problem: Elimination: Goal: Will not experience complications related to urinary retention Outcome: Progressing   Problem: Pain Management: Goal: General experience of comfort will improve Outcome: Progressing   Problem: Skin Integrity: Goal: Risk for impaired skin integrity will decrease Outcome: Progressing

## 2022-12-05 NOTE — Progress Notes (Signed)
Mobility Specialist Progress Note:   12/05/22 1113  Mobility  Activity Ambulated with assistance in hallway  Level of Assistance Contact guard assist, steadying assist  Assistive Device Four wheel walker  Distance Ambulated (ft) 120 ft  Range of Motion/Exercises Active;All extremities  Activity Response Tolerated well  Mobility Referral Yes  $Mobility charge 1 Mobility  Mobility Specialist Start Time (ACUTE ONLY) 1100  Mobility Specialist Stop Time (ACUTE ONLY) 1112  Mobility Specialist Time Calculation (min) (ACUTE ONLY) 12 min   Pt received in chair, agreeable to mobility session. SpO2 89%-91% on 4L throughout session. Tolerated well, denies SOB or dizziness. Ambulated with CGA and RW. Returned pt to room, all needs met, call bell in reach, chair alarm on. Left pt on 4.5L, SpO2 93%.   Feliciana Rossetti Mobility Specialist Please contact via Special educational needs teacher or  Rehab office at [phone]

## 2022-12-05 NOTE — Plan of Care (Signed)

## 2022-12-06 ENCOUNTER — Telehealth: Payer: Self-pay | Admitting: Family Medicine

## 2022-12-06 ENCOUNTER — Other Ambulatory Visit (HOSPITAL_COMMUNITY): Payer: Self-pay

## 2022-12-06 DIAGNOSIS — A411 Sepsis due to other specified staphylococcus: Secondary | ICD-10-CM | POA: Diagnosis not present

## 2022-12-06 LAB — CBC
HCT: 36.9 % — ABNORMAL LOW (ref 39.0–52.0)
Hemoglobin: 12 g/dL — ABNORMAL LOW (ref 13.0–17.0)
MCH: 30.3 pg (ref 26.0–34.0)
MCHC: 32.5 g/dL (ref 30.0–36.0)
MCV: 93.2 fL (ref 80.0–100.0)
Platelets: 178 10*3/uL (ref 150–400)
RBC: 3.96 MIL/uL — ABNORMAL LOW (ref 4.22–5.81)
RDW: 13.3 % (ref 11.5–15.5)
WBC: 5.4 10*3/uL (ref 4.0–10.5)
nRBC: 0 % (ref 0.0–0.2)

## 2022-12-06 LAB — COMPREHENSIVE METABOLIC PANEL
ALT: 30 U/L (ref 0–44)
AST: 34 U/L (ref 15–41)
Albumin: 3 g/dL — ABNORMAL LOW (ref 3.5–5.0)
Alkaline Phosphatase: 49 U/L (ref 38–126)
Anion gap: 5 (ref 5–15)
BUN: 21 mg/dL (ref 8–23)
CO2: 24 mmol/L (ref 22–32)
Calcium: 9.8 mg/dL (ref 8.9–10.3)
Chloride: 109 mmol/L (ref 98–111)
Creatinine, Ser: 1.3 mg/dL — ABNORMAL HIGH (ref 0.61–1.24)
GFR, Estimated: 54 mL/min — ABNORMAL LOW (ref >60)
Glucose, Bld: 129 mg/dL — ABNORMAL HIGH (ref 70–99)
Potassium: 4.1 mmol/L (ref 3.5–5.1)
Sodium: 138 mmol/L (ref 135–145)
Total Bilirubin: 0.5 mg/dL (ref 0.3–1.2)
Total Protein: 6.4 g/dL — ABNORMAL LOW (ref 6.5–8.1)

## 2022-12-06 LAB — PHOSPHORUS: Phosphorus: 2.3 mg/dL — ABNORMAL LOW (ref 2.5–4.6)

## 2022-12-06 LAB — BRAIN NATRIURETIC PEPTIDE: B Natriuretic Peptide: 89.1 pg/mL (ref 0.0–100.0)

## 2022-12-06 LAB — MAGNESIUM: Magnesium: 2.2 mg/dL (ref 1.7–2.4)

## 2022-12-06 MED ORDER — AZITHROMYCIN 250 MG PO TABS
250.0000 mg | ORAL_TABLET | Freq: Every day | ORAL | 0 refills | Status: AC
  Filled 2022-12-06: qty 4, 4d supply, fill #0

## 2022-12-06 MED ORDER — ALBUTEROL SULFATE HFA 108 (90 BASE) MCG/ACT IN AERS
1.0000 | INHALATION_SPRAY | Freq: Four times a day (QID) | RESPIRATORY_TRACT | 0 refills | Status: DC | PRN
  Filled 2022-12-06: qty 18, 30d supply, fill #0

## 2022-12-06 MED ORDER — AMOXICILLIN-POT CLAVULANATE 875-125 MG PO TABS
1.0000 | ORAL_TABLET | Freq: Two times a day (BID) | ORAL | 0 refills | Status: AC
  Filled 2022-12-06: qty 10, 5d supply, fill #0

## 2022-12-06 MED ORDER — SENNOSIDES-DOCUSATE SODIUM 8.6-50 MG PO TABS
1.0000 | ORAL_TABLET | Freq: Every evening | ORAL | 0 refills | Status: DC | PRN
  Filled 2022-12-06: qty 30, 30d supply, fill #0

## 2022-12-06 NOTE — Progress Notes (Signed)
   12/05/22 2210  BiPAP/CPAP/SIPAP  Reason BIPAP/CPAP not in use Non-compliant (refused)

## 2022-12-06 NOTE — Plan of Care (Signed)
  Problem: Education: Goal: Knowledge of General Education information will improve Description: Including pain rating scale, medication(s)/side effects and non-pharmacologic comfort measures Outcome: Progressing   Problem: Health Behavior/Discharge Planning: Goal: Ability to manage health-related needs will improve Outcome: Progressing   Problem: Activity: Goal: Risk for activity intolerance will decrease Outcome: Progressing   Problem: Coping: Goal: Level of anxiety will decrease Outcome: Progressing   Problem: Safety: Goal: Ability to remain free from injury will improve Outcome: Progressing   Problem: Skin Integrity: Goal: Risk for impaired skin integrity will decrease Outcome: Progressing

## 2022-12-06 NOTE — Telephone Encounter (Signed)
Daughter picked patient up from hospital today and needing follow up in 7 days. Please advise where to add to schedule

## 2022-12-06 NOTE — Progress Notes (Signed)
Occupational Therapy Treatment Patient Details Name: [name] MRN: [id number] DOB: 1940-10-10 Today's Date: 12/06/2022   History of present illness 82 yo male admitted 10/27 for generalized weakness, sepsis, acute diverticulitis. PMH: CAD, HFpEF, COPD, chronic hypoxemic respiratory failure on 4L home O2, ulcerative colitis, diverticulosis, GERD, HTN, HLD, nephrolithiasis, ankylosing spondylitis, BPH, OSA, osteopenia   OT comments  Pt is making good progress towards their acute OT goals. Upon arrival pt's daughter was assist pt with ADLs. Encourage pt and family to allow pt to complete ADL as independently as possible. Daughter stated "this is what we do at home" and overall pt completed LB dressing and peri hygiene with mod I. He tolerated several transfers and hallway mobility on 4L with rollator and superivsion A.  OT to continue to follow acutely to facilitate progress towards established goals. Pt will continue to benefit from [facility].       If plan is discharge home, recommend the following:  A little help with walking and/or transfers;A little help with bathing/dressing/bathroom;Assistance with cooking/housework;Assist for transportation;Help with stairs or ramp for entrance   Equipment Recommendations  None recommended by OT       Precautions / Restrictions Precautions Precautions: Fall;Other (comment) Precaution Comments: 4L at baseline Restrictions Weight Bearing Restrictions: No       Mobility Bed Mobility               General bed mobility comments: OOB upon arrival    Transfers Overall transfer level: Needs assistance Equipment used: Rollator (4 wheels) Transfers: Sit to/from Stand Sit to Stand: Supervision                 Balance Overall balance assessment: Needs assistance Sitting-balance support: Feet supported, No upper extremity supported Sitting balance-Leahy Scale: Good     Standing balance support: Single extremity supported, During  functional activity Standing balance-Leahy Scale: Fair                             ADL either performed or assessed with clinical judgement   ADL Overall ADL's : Needs assistance/impaired                 Upper Body Dressing : Set up;Sitting   Lower Body Dressing: Maximal assistance;Sit to/from stand Lower Body Dressing Details (indicate cue type and reason): anticipate pt could do a lot more for himself, pt's daughter present upon arrival and assisting him with LB ADLs. Educated pt and family to allow pt to do as much for himself as he can, daughter responded "this is what we do at home" Toilet Transfer: Contact guard assist   Toileting- Clothing Manipulation and Hygiene: Moderate assistance Toileting - Clothing Manipulation Details (indicate cue type and reason): again, anticipate pt could do this with superivison A. Daughter assisting with pericare despite education and encouragement     Functional mobility during ADLs: Contact guard assist;Rollator (4 wheels) General ADL Comments: maximal education provided throughout to encourage pt and family to allow pt to be as indep as poosible instead of doing things for him    Extremity/Trunk Assessment Upper Extremity Assessment Upper Extremity Assessment: Generalized weakness   Lower Extremity Assessment Lower Extremity Assessment: Defer to PT evaluation        Vision   Vision Assessment?: No apparent visual deficits   Perception Perception Perception: Within Functional Limits   Praxis Praxis Praxis: WFL    Cognition Arousal: Alert Behavior During Therapy: [facility] for tasks assessed/performed  Overall Cognitive Status: Within Functional Limits for tasks assessed                    General Comments VSS on 4L    Pertinent Vitals/ Pain       Pain Assessment Pain Assessment: No/denies pain   Frequency  Min 1X/week        Progress Toward Goals  OT Goals(current goals can now be found in the care  plan section)  Progress towards OT goals: Progressing toward goals  Acute Rehab OT Goals Patient Stated Goal: to go home OT Goal Formulation: With patient Time For Goal Achievement: 12/18/22 Potential to Achieve Goals: Good ADL Goals Pt Will Perform Grooming: with modified independence;standing Pt Will Perform Lower Body Dressing: with modified independence;sit to/from stand Pt Will Transfer to Toilet: with modified independence;ambulating;regular height toilet Additional ADL Goal #1: Pt will indep recall at least 3 energy conservation strategies to apply at discharge Additional ADL Goal #2: Pt will tolerate at least 8 minutes of OOB activity without sitting rest break to demonstrate increased tolerance for ADLs at discharge   AM-PAC OT "6 Clicks" Daily Activity     Outcome Measure   Help from another person eating meals?: None Help from another person taking care of personal grooming?: A Little Help from another person toileting, which includes using toliet, bedpan, or urinal?: A Little Help from another person bathing (including washing, rinsing, drying)?: A Little Help from another person to put on and taking off regular upper body clothing?: A Little Help from another person to put on and taking off regular lower body clothing?: A Little 6 Click Score: 19    End of Session Equipment Utilized During Treatment: Oxygen  OT Visit Diagnosis: Unsteadiness on feet (R26.81);Other abnormalities of gait and mobility (R26.89);Muscle weakness (generalized) (M62.81)   Activity Tolerance Patient tolerated treatment well   Patient Left in bed;with call bell/phone within reach;with family/visitor present   Nurse Communication Mobility status        Time: 2956-2130 OT Time Calculation (min): 19 min  Charges: OT General Charges $OT Visit: 1 Visit OT Treatments $Self Care/Home Management : 8-22 mins  Derenda Mis, OTR/L Acute Rehabilitation Services Office [phone] Secure  Chat Communication Preferred   Donia Pounds 12/06/2022, 1:48 PM

## 2022-12-06 NOTE — Discharge Summary (Signed)
Physician Discharge Summary  [name]:[id number] DOB: 06/06/1940 DOA: 12/02/2022  PCP: Babs Sciara, MD  Admit date: 12/02/2022 Discharge date: 12/06/2022  Admitted From: Home Disposition: Home  Recommendations for Outpatient Follow-up:  Follow up with PCP in 1-2 weeks Please obtain BMP/CBC in one week your next doctors visit.  Oral Augmentin and azithromycin until December 11, 2022 Hold p.o. torsemide and Aldactone for 2 more days, resumed December 08, 2022  Discharge Condition: Stable CODE STATUS: Full code Diet recommendation: Heart healthy    Brief hospital course: 82yo with h/o CAD, HFpER, COPD on 4L home O2, UC, HTN, HLD, ankylosing spondylitis, BPH, OSA on CPAP and morbid obesity who presented on 10/27 with fever to 102.7 in the setting of diverticulitis. He was started on Ceftriaxone and metronidazole.  Eventually due to concerns of pneumonia azithromycin was added.  Blood cultures positive for Staph hominis, and case was discussed with ID.  This was thought to be contaminated.  Changed to oral antibiotics. Today doing well, will dc him with HH.    Assessment and Plan:   Sepsis secondary to diverticulitis with possible pneumonia Strep hominis bacteremia Sepsis physiology slowly improving.  Monitor culture data.  I suspect this is a contaminated therefore we will hold off on further treatment.  Procalcitonin 0.3, BNP 125. Switch to PO Aug/Azi for now.  Last day November 5 - Can use home as needed bronchodilators.   Elevated LFTs, improved Improved   CAD Chest pain-free.  Continue aspirin, Coreg, Imdur, statin   Chronic HFpEF Last echo done in December 2023 showing EF 55 to 60% Appears euvolemic.  Torsemide on hold   COPD with chronic hypoxia 4 L nasal cannula Continue bronchodilators   AKI on CKD stage IIIa Bilateral renal stones, nonobstructive Baseline creatinine 1.4.  peaked at 1.88, now at 1.3 Recommend oral hydration    Hypertension Norvasc, Coreg, Imdur   Hyperlipidemia We will resume statin   OSA Continue nightly CPAP   Mood disorder/dementia Delirium precaution.  Cymbalta   GOC Patient is DNR confirmed by previous provider  PT/OT= f100f done   DVT ppx: Lovenox Code: DNR Family Communication:  Disposition: Status is: Inpatient Discharge today     Subjective: No complaints, feels ok.  Overall feels well.  Wishes to go home  Examination:   General exam: Appears calm and comfortable, on chronic 4 L nasal cannula Respiratory system: Minimal bilateral rhonchi-appears to be chronic Cardiovascular system: S1 & S2 heard, RRR. No JVD, murmurs, rubs, gallops or clicks. No pedal edema. Gastrointestinal system: Abdomen is nondistended, soft and nontender. No organomegaly or masses felt. Normal bowel sounds heard. Central nervous system: Alert and oriented. No focal neurological deficits. Extremities: Symmetric 5 x 5 power. Skin: No rashes, lesions or ulcers Psychiatry: Judgement and insight appear normal. Mood & affect appropriate.   Discharge Diagnoses:  Principal Problem:   Sepsis due to Staphylococcus hominis ([name]) Active Problems:   OSA treated with BiPAP   CAD (coronary artery disease)   Essential hypertension   Morbid obesity ([name])   Chronic heart failure with preserved ejection fraction (HFpEF) ([name])   COPD (chronic obstructive pulmonary disease) ([name])   DNR (do not resuscitate)   Stage 3a chronic kidney disease (CKD) ([name])   Elevated transaminase level    Discharge Exam: Vitals:   12/06/22 0853 12/06/22 1018  BP: (!) 128/46 (!) 137/53  Pulse: 62 (!) 59  Resp:    Temp:    SpO2:  94%   Vitals:  12/06/22 0838 12/06/22 0851 12/06/22 0853 12/06/22 1018  BP:  (!) 128/46 (!) 128/46 (!) 137/53  Pulse:  62 62 (!) 59  Resp:      Temp:      TempSrc:      SpO2: 93%   94%  Weight:      Height:         Discharge Instructions   Allergies as of 12/06/2022   No Known  Allergies      Medication List     TAKE these medications    acetaminophen 500 MG tablet Commonly known as: TYLENOL Take 2 tablets (1,000 mg total) by mouth every 6 (six) hours as needed for headache (pain).   albuterol 108 (90 Base) MCG/ACT inhaler Commonly known as: VENTOLIN HFA Inhale 1-2 puffs into the lungs every 6 (six) hours as needed for wheezing or shortness of breath. What changed: how much to take   amLODipine 5 MG tablet Commonly known as: NORVASC Take one tablet po daily   amoxicillin-clavulanate 875-125 MG tablet Commonly known as: AUGMENTIN Take 1 tablet by mouth every 12 (twelve) hours for 5 days.   aspirin EC 81 MG tablet Take 81 mg by mouth daily. Swallow whole.   azithromycin 250 MG tablet Commonly known as: ZITHROMAX Take 1 tablet (250 mg total) by mouth daily for 4 days. Start taking on: December 07, 2022   carvedilol 12.5 MG tablet Commonly known as: COREG Take 1 tablet (12.5 mg total) by mouth 2 (two) times daily with a meal.   DULoxetine 60 MG capsule Commonly known as: CYMBALTA TAKE 1 CAPSULE BY MOUTH DAILY   hydrALAZINE 50 MG tablet Commonly known as: APRESOLINE TAKE 1 TABLET(50 MG) BY MOUTH THREE TIMES DAILY   isosorbide mononitrate 30 MG 24 hr tablet Commonly known as: IMDUR TAKE 3 TABLETS(90 MG) BY MOUTH DAILY   nitroGLYCERIN 0.4 MG SL tablet Commonly known as: NITROSTAT ONE TABLET UNDER TONGUE AS NEEDED FOR CHEST PAIN EVERY 5 MINUTES AS DIRECTED What changed: See the new instructions.   pravastatin 40 MG tablet Commonly known as: PRAVACHOL 1 qd   senna-docusate 8.6-50 MG tablet Commonly known as: Senokot-S Take 1 tablet by mouth at bedtime as needed for moderate constipation.   spironolactone 25 MG tablet Commonly known as: ALDACTONE Take 0.5 tablets (12.5 mg total) by mouth daily.   torsemide 20 MG tablet Commonly known as: DEMADEX 1 qam and may take 2 pill every day prn if weight gain greater than 3 pounds in a  week What changed:  how much to take how to take this when to take this additional instructions   Vitamin D (Ergocalciferol) 1.25 MG (50000 UNIT) Caps capsule Commonly known as: DRISDOL TAKE 1 CAPSULE BY MOUTH EVERY 7 DAYS What changed: when to take this        Follow-up Information     [facility].   Specialty: Home Health Services Why: [facility] will be providing home health services.  They will call you in the next 24-48 hours to set up services. Contact information: [address] [place] [address] [phone]         Babs Sciara, MD Follow up in 1 week(s).   Specialty: Family Medicine Contact information: [address] [place] [address] [phone]                No Known Allergies  You were cared for by a hospitalist during your hospital  stay. If you have any questions about your discharge medications or the care you received while you were in the hospital after you are discharged, you can call the unit and asked to speak with the hospitalist on call if the hospitalist that took care of you is not available. Once you are discharged, your primary care physician will handle any further medical issues. Please note that no refills for any discharge medications will be authorized once you are discharged, as it is imperative that you return to your primary care physician (or establish a relationship with a primary care physician if you do not have one) for your aftercare needs so that they can reassess your need for medications and monitor your lab values.  You were cared for by a hospitalist during your hospital stay. If you have any questions about your discharge medications or the care you received while you were in the hospital after you are discharged, you can call the unit and asked to speak with the hospitalist on call if the hospitalist that took care of you is not available. Once you are  discharged, your primary care physician will handle any further medical issues. Please note that NO REFILLS for any discharge medications will be authorized once you are discharged, as it is imperative that you return to your primary care physician (or establish a relationship with a primary care physician if you do not have one) for your aftercare needs so that they can reassess your need for medications and monitor your lab values.  Please request your Prim.MD to go over all Hospital Tests and Procedure/Radiological results at the follow up, please get all Hospital records sent to your Prim MD by signing hospital release before you go home.  Get CBC, CMP, 2 view Chest X ray checked  by Primary MD during your next visit or SNF MD in 5-7 days ( we routinely change or add medications that can affect your baseline labs and fluid status, therefore we recommend that you get the mentioned basic workup next visit with your PCP, your PCP may decide not to get them or add new tests based on their clinical decision)  On your next visit with your primary care physician please Get Medicines reviewed and adjusted.  If you experience worsening of your admission symptoms, develop shortness of breath, life threatening emergency, suicidal or homicidal thoughts you must seek medical attention immediately by calling 911 or calling your MD immediately  if symptoms less severe.  You Must read complete instructions/literature along with all the possible adverse reactions/side effects for all the Medicines you take and that have been prescribed to you. Take any new Medicines after you have completely understood and accpet all the possible adverse reactions/side effects.   Do not drive, operate heavy machinery, perform activities at heights, swimming or participation in water activities or provide baby sitting services if your were admitted for syncope or siezures until you have seen by Primary MD or a Neurologist and advised  to do so again.  Do not drive when taking Pain medications.   Procedures/Studies: DG CHEST PORT 1 VIEW  Result Date: 12/04/2022 CLINICAL DATA:  Cough. EXAM: PORTABLE CHEST 1 VIEW COMPARISON:  Chest x-ray October 27, 24. FINDINGS: Chronic enlargement the cardiac silhouette. Mild streaky bibasilar opacities. No visible pleural effusions or pneumothorax. Polyarticular degenerative change. Aortic atherosclerosis. IMPRESSION: 1. Mild streaky bibasilar opacities which could represent atelectasis, aspiration, and/or pneumonia. 2. Chronic cardiomegaly. Electronically Signed   By: Juluis Mire.D.  On: 12/04/2022 14:38   CT ABDOMEN PELVIS W CONTRAST  Result Date: 12/03/2022 CLINICAL DATA:  Left lower quadrant abdominal pain. EXAM: CT ABDOMEN AND PELVIS WITH CONTRAST TECHNIQUE: Multidetector CT imaging of the abdomen and pelvis was performed using the standard protocol following bolus administration of intravenous contrast. RADIATION DOSE REDUCTION: This exam was performed according to the departmental dose-optimization program which includes automated exposure control, adjustment of the mA and/or kV according to patient size and/or use of iterative reconstruction technique. CONTRAST:  75mL OMNIPAQUE IOHEXOL 350 MG/ML SOLN COMPARISON:  01/24/2022. FINDINGS: Lower chest: The heart is enlarged and there is no pericardial effusion. Multi-vessel coronary artery calcifications are seen. Patchy airspace disease is present in the lower lobes bilaterally. Hepatobiliary: No focal liver abnormality is seen. Stones are present within the gallbladder. No biliary ductal dilatation is seen. Pancreas: Unremarkable. No pancreatic ductal dilatation or surrounding inflammatory changes. Spleen: Normal in size without focal abnormality. Adrenals/Urinary Tract: The adrenal glands are within normal limits. A cyst is present in the left kidney. Additional hypodensities are present in the kidneys bilaterally which are too small  to further characterize. Renal calculi are present bilaterally. There is prominence of the collecting system on the left which is unchanged from the previous exam. No ureteral calculus or obstructive uropathy is seen. The bladder is unremarkable. Stomach/Bowel: Stomach is within normal limits. Appendix appears normal. No bowel obstruction, free air or pneumatosis is seen. Scattered diverticula are present along the descending colon with minimal associated fat stranding. Vascular/Lymphatic: Aortic atherosclerosis. Nonspecific prominent lymph nodes are noted in the retroperitoneum. Reproductive: Prostate is unremarkable. Other: No abdominopelvic ascites. A fat containing umbilical hernia is present. Musculoskeletal: Degenerative changes are present in the thoracolumbar spine. There is partial ankylosis of the sacroiliac joints bilaterally. No acute osseous abnormality. IMPRESSION: 1. Few scattered diverticula along the descending colon with subtle fat stranding, possible diverticulitis. 2. Patchy airspace disease at the lung bases, possible atelectasis or infiltrate. 3. Cholelithiasis. 4. Bilateral nephrolithiasis. 5. Aortic atherosclerosis. Electronically Signed   By: Thornell Sartorius M.D.   On: 12/03/2022 01:57   DG Chest 2 View  Result Date: 12/02/2022 CLINICAL DATA:  Suspected Sepsis EXAM: CHEST - 2 VIEW COMPARISON:  Chest x-ray 02/13/2022, CT chest 01/16/2022 FINDINGS: The heart and mediastinal contours are unchanged. Aortic calcification. Chronic coarsened interstitial markings. No focal consolidation. No pulmonary edema. Possible trace bilateral pleural effusions. No pneumothorax. No acute osseous abnormality. IMPRESSION: 1. Possible trace bilateral pleural effusions 2. Aortic Atherosclerosis (ICD10-I70.0) and Emphysema (ICD10-J43.9). Electronically Signed   By: Tish Frederickson M.D.   On: 12/02/2022 23:47     The results of significant diagnostics from this hospitalization (including imaging, microbiology,  ancillary and laboratory) are listed below for reference.     Microbiology: Recent Results (from the past 240 hour(s))  Culture, blood (Routine x 2)     Status: None (Preliminary result)   Collection Time: 12/02/22 11:20 PM   Specimen: BLOOD  Result Value Ref Range Status   Specimen Description BLOOD SITE NOT SPECIFIED  Final   Special Requests   Final    BOTTLES DRAWN AEROBIC AND ANAEROBIC Blood Culture results may not be optimal due to an excessive volume of blood received in culture bottles   Culture   Final    NO GROWTH 3 DAYS Performed at [facility], [address], [place], [place] [address]    Report Status PENDING  Incomplete  Resp panel by RT-PCR (RSV, Flu A&B, Covid) Anterior Nasal Swab  Status: None   Collection Time: 12/02/22 11:54 PM   Specimen: Anterior Nasal Swab  Result Value Ref Range Status   SARS Coronavirus 2 by RT PCR NEGATIVE NEGATIVE Final   Influenza A by PCR NEGATIVE NEGATIVE Final   Influenza B by PCR NEGATIVE NEGATIVE Final    Comment: (NOTE) The Xpert Xpress SARS-CoV-2/FLU/RSV plus assay is intended as an aid in the diagnosis of influenza from Nasopharyngeal swab specimens and should not be used as a sole basis for treatment. Nasal washings and aspirates are unacceptable for Xpert Xpress SARS-CoV-2/FLU/RSV testing.  Fact Sheet for Patients: BloggerCourse.com  Fact Sheet for Healthcare Providers: SeriousBroker.it  This test is not yet approved or cleared by the [place] FDA and has been authorized for detection and/or diagnosis of SARS-CoV-2 by FDA under an Emergency Use Authorization (EUA). This EUA will remain in effect (meaning this test can be used) for the duration of the COVID-19 declaration under Section 564(b)(1) of the Act, 21 U.S.C. section 360bbb-3(b)(1), unless the authorization is terminated or revoked.     Resp Syncytial Virus by PCR NEGATIVE NEGATIVE Final     Comment: (NOTE) Fact Sheet for Patients: BloggerCourse.com  Fact Sheet for Healthcare Providers: SeriousBroker.it  This test is not yet approved or cleared by the [place] FDA and has been authorized for detection and/or diagnosis of SARS-CoV-2 by FDA under an Emergency Use Authorization (EUA). This EUA will remain in effect (meaning this test can be used) for the duration of the COVID-19 declaration under Section 564(b)(1) of the Act, 21 U.S.C. section 360bbb-3(b)(1), unless the authorization is terminated or revoked.  Performed at [facility], [address], [place], [place] [address]   Culture, blood (Routine X 2) w Reflex to ID Panel     Status: Abnormal   Collection Time: 12/02/22 11:55 PM   Specimen: BLOOD  Result Value Ref Range Status   Specimen Description BLOOD RIGHT ANTECUBITAL  Final   Special Requests   Final    BOTTLES DRAWN AEROBIC AND ANAEROBIC Blood Culture adequate volume   Culture  Setup Time   Final    GRAM POSITIVE COCCI IN CLUSTERS AEROBIC BOTTLE ONLY CRITICAL RESULT CALLED TO, READ BACK BY AND VERIFIED WITH: PHARMD EMMA PAYTES ON 12/03/22 @ 2003 BY DRT    Culture (A)  Final    STAPHYLOCOCCUS HOMINIS THE SIGNIFICANCE OF ISOLATING THIS ORGANISM FROM A SINGLE SET OF BLOOD CULTURES WHEN MULTIPLE SETS ARE DRAWN IS UNCERTAIN. PLEASE NOTIFY THE MICROBIOLOGY DEPARTMENT WITHIN ONE WEEK IF SPECIATION AND SENSITIVITIES ARE REQUIRED. Performed at [facility], [address], [place], [place] [address]    Report Status 12/04/2022 FINAL  Final  Blood Culture ID Panel (Reflexed)     Status: Abnormal   Collection Time: 12/02/22 11:55 PM  Result Value Ref Range Status   Enterococcus faecalis NOT DETECTED NOT DETECTED Final   Enterococcus Faecium NOT DETECTED NOT DETECTED Final   Listeria monocytogenes NOT DETECTED NOT DETECTED Final   Staphylococcus species DETECTED (A) NOT DETECTED Final    Comment:  CRITICAL RESULT CALLED TO, READ BACK BY AND VERIFIED WITH: PHARMD EMMA PAYTES ON 12/03/22 @ 2003 BY DRT    Staphylococcus aureus (BCID) NOT DETECTED NOT DETECTED Final   Staphylococcus epidermidis NOT DETECTED NOT DETECTED Final   Staphylococcus lugdunensis NOT DETECTED NOT DETECTED Final   Streptococcus species NOT DETECTED NOT DETECTED Final   Streptococcus agalactiae NOT DETECTED NOT DETECTED Final   Streptococcus pneumoniae NOT DETECTED NOT DETECTED  Final   Streptococcus pyogenes NOT DETECTED NOT DETECTED Final   A.calcoaceticus-baumannii NOT DETECTED NOT DETECTED Final   Bacteroides fragilis NOT DETECTED NOT DETECTED Final   Enterobacterales NOT DETECTED NOT DETECTED Final   Enterobacter cloacae complex NOT DETECTED NOT DETECTED Final   Escherichia coli NOT DETECTED NOT DETECTED Final   Klebsiella aerogenes NOT DETECTED NOT DETECTED Final   Klebsiella oxytoca NOT DETECTED NOT DETECTED Final   Klebsiella pneumoniae NOT DETECTED NOT DETECTED Final   Proteus species NOT DETECTED NOT DETECTED Final   Salmonella species NOT DETECTED NOT DETECTED Final   Serratia marcescens NOT DETECTED NOT DETECTED Final   Haemophilus influenzae NOT DETECTED NOT DETECTED Final   Neisseria meningitidis NOT DETECTED NOT DETECTED Final   Pseudomonas aeruginosa NOT DETECTED NOT DETECTED Final   Stenotrophomonas maltophilia NOT DETECTED NOT DETECTED Final   Candida albicans NOT DETECTED NOT DETECTED Final   Candida auris NOT DETECTED NOT DETECTED Final   Candida glabrata NOT DETECTED NOT DETECTED Final   Candida krusei NOT DETECTED NOT DETECTED Final   Candida parapsilosis NOT DETECTED NOT DETECTED Final   Candida tropicalis NOT DETECTED NOT DETECTED Final   Cryptococcus neoformans/gattii NOT DETECTED NOT DETECTED Final    Comment: Performed at [facility], [address] [place], [place] [address]     Labs: BNP (last 3 results) Recent Labs    01/17/22 0231 12/03/22 1042 12/06/22 0828   BNP 159.1* 125.7* 89.1   Basic Metabolic Panel: Recent Labs  Lab 12/02/22 2303 12/03/22 1042 12/04/22 1005 12/06/22 0828  NA 137 135 137 138  K 4.1 3.6 3.8 4.1  CL 101 104 104 109  CO2 24 24 29 24   GLUCOSE 166* 146* 177* 129*  BUN 20 19 23 21   CREATININE 1.53* 1.49* 1.88* 1.30*  CALCIUM 10.4* 9.2 9.7 9.8  MG  --   --   --  2.2  PHOS  --   --   --  2.3*   Liver Function Tests: Recent Labs  Lab 12/02/22 2303 12/03/22 1042 12/06/22 0828  AST 75* 50* 34  ALT 55* 43 30  ALKPHOS 70 57 49  BILITOT 0.3 0.4 0.5  PROT 7.5 6.5 6.4*  ALBUMIN 3.6 3.0* 3.0*   No results for input(s): "LIPASE", "AMYLASE" in the last 168 hours. No results for input(s): "AMMONIA" in the last 168 hours. CBC: Recent Labs  Lab 12/02/22 2303 12/03/22 1042 12/04/22 1005 12/06/22 0828  WBC 10.8* 10.7* 8.8 5.4  NEUTROABS 8.1*  --  5.8  --   HGB 13.2 11.9* 12.0* 12.0*  HCT 41.0 36.3* 37.5* 36.9*  MCV 93.4 93.1 93.5 93.2  PLT 179 167 155 178   Cardiac Enzymes: No results for input(s): "CKTOTAL", "CKMB", "CKMBINDEX", "TROPONINI" in the last 168 hours. BNP: Invalid input(s): "POCBNP" CBG: No results for input(s): "GLUCAP" in the last 168 hours. D-Dimer No results for input(s): "DDIMER" in the last 72 hours. Hgb A1c No results for input(s): "HGBA1C" in the last 72 hours. Lipid Profile No results for input(s): "CHOL", "HDL", "LDLCALC", "TRIG", "CHOLHDL", "LDLDIRECT" in the last 72 hours. Thyroid function studies No results for input(s): "TSH", "T4TOTAL", "T3FREE", "THYROIDAB" in the last 72 hours.  Invalid input(s): "FREET3" Anemia work up No results for input(s): "VITAMINB12", "FOLATE", "FERRITIN", "TIBC", "IRON", "RETICCTPCT" in the last 72 hours. Urinalysis    Component Value Date/Time   COLORURINE STRAW (A) 12/02/2022 2303   APPEARANCEUR CLEAR 12/02/2022 2303   LABSPEC 1.008 12/02/2022 2303   PHURINE 6.0  12/02/2022 2303   GLUCOSEU NEGATIVE 12/02/2022 2303   HGBUR SMALL (A)  12/02/2022 2303   BILIRUBINUR NEGATIVE 12/02/2022 2303   BILIRUBINUR ++ 11/18/2012 1319   KETONESUR NEGATIVE 12/02/2022 2303   PROTEINUR NEGATIVE 12/02/2022 2303   UROBILINOGEN 0.2 08/04/2014 1620   NITRITE NEGATIVE 12/02/2022 2303   LEUKOCYTESUR SMALL (A) 12/02/2022 2303   Sepsis Labs Recent Labs  Lab 12/02/22 2303 12/03/22 1042 12/04/22 1005 12/06/22 0828  WBC 10.8* 10.7* 8.8 5.4   Microbiology Recent Results (from the past 240 hour(s))  Culture, blood (Routine x 2)     Status: None (Preliminary result)   Collection Time: 12/02/22 11:20 PM   Specimen: BLOOD  Result Value Ref Range Status   Specimen Description BLOOD SITE NOT SPECIFIED  Final   Special Requests   Final    BOTTLES DRAWN AEROBIC AND ANAEROBIC Blood Culture results may not be optimal due to an excessive volume of blood received in culture bottles   Culture   Final    NO GROWTH 3 DAYS Performed at [facility], [address] [place], [place] [address]    Report Status PENDING  Incomplete  Resp panel by RT-PCR (RSV, Flu A&B, Covid) Anterior Nasal Swab     Status: None   Collection Time: 12/02/22 11:54 PM   Specimen: Anterior Nasal Swab  Result Value Ref Range Status   SARS Coronavirus 2 by RT PCR NEGATIVE NEGATIVE Final   Influenza A by PCR NEGATIVE NEGATIVE Final   Influenza B by PCR NEGATIVE NEGATIVE Final    Comment: (NOTE) The Xpert Xpress SARS-CoV-2/FLU/RSV plus assay is intended as an aid in the diagnosis of influenza from Nasopharyngeal swab specimens and should not be used as a sole basis for treatment. Nasal washings and aspirates are unacceptable for Xpert Xpress SARS-CoV-2/FLU/RSV testing.  Fact Sheet for Patients: BloggerCourse.com  Fact Sheet for Healthcare Providers: SeriousBroker.it  This test is not yet approved or cleared by the [place] FDA and has been authorized for detection and/or diagnosis of SARS-CoV-2 by FDA under  an Emergency Use Authorization (EUA). This EUA will remain in effect (meaning this test can be used) for the duration of the COVID-19 declaration under Section 564(b)(1) of the Act, 21 U.S.C. section 360bbb-3(b)(1), unless the authorization is terminated or revoked.     Resp Syncytial Virus by PCR NEGATIVE NEGATIVE Final    Comment: (NOTE) Fact Sheet for Patients: BloggerCourse.com  Fact Sheet for Healthcare Providers: SeriousBroker.it  This test is not yet approved or cleared by the [place] FDA and has been authorized for detection and/or diagnosis of SARS-CoV-2 by FDA under an Emergency Use Authorization (EUA). This EUA will remain in effect (meaning this test can be used) for the duration of the COVID-19 declaration under Section 564(b)(1) of the Act, 21 U.S.C. section 360bbb-3(b)(1), unless the authorization is terminated or revoked.  Performed at [facility], [address] [place], [place] [address]   Culture, blood (Routine X 2) w Reflex to ID Panel     Status: Abnormal   Collection Time: 12/02/22 11:55 PM   Specimen: BLOOD  Result Value Ref Range Status   Specimen Description BLOOD RIGHT ANTECUBITAL  Final   Special Requests   Final    BOTTLES DRAWN AEROBIC AND ANAEROBIC Blood Culture adequate volume   Culture  Setup Time   Final    GRAM POSITIVE COCCI IN CLUSTERS AEROBIC BOTTLE ONLY CRITICAL RESULT CALLED TO, READ BACK BY AND VERIFIED WITH: PHARMD EMMA  PAYTES ON 12/03/22 @ 2003 BY DRT    Culture (A)  Final    STAPHYLOCOCCUS HOMINIS THE SIGNIFICANCE OF ISOLATING THIS ORGANISM FROM A SINGLE SET OF BLOOD CULTURES WHEN MULTIPLE SETS ARE DRAWN IS UNCERTAIN. PLEASE NOTIFY THE MICROBIOLOGY DEPARTMENT WITHIN ONE WEEK IF SPECIATION AND SENSITIVITIES ARE REQUIRED. Performed at [facility], [address], [place], [place] [address]    Report Status 12/04/2022 FINAL  Final  Blood Culture ID Panel  (Reflexed)     Status: Abnormal   Collection Time: 12/02/22 11:55 PM  Result Value Ref Range Status   Enterococcus faecalis NOT DETECTED NOT DETECTED Final   Enterococcus Faecium NOT DETECTED NOT DETECTED Final   Listeria monocytogenes NOT DETECTED NOT DETECTED Final   Staphylococcus species DETECTED (A) NOT DETECTED Final    Comment: CRITICAL RESULT CALLED TO, READ BACK BY AND VERIFIED WITH: PHARMD EMMA PAYTES ON 12/03/22 @ 2003 BY DRT    Staphylococcus aureus (BCID) NOT DETECTED NOT DETECTED Final   Staphylococcus epidermidis NOT DETECTED NOT DETECTED Final   Staphylococcus lugdunensis NOT DETECTED NOT DETECTED Final   Streptococcus species NOT DETECTED NOT DETECTED Final   Streptococcus agalactiae NOT DETECTED NOT DETECTED Final   Streptococcus pneumoniae NOT DETECTED NOT DETECTED Final   Streptococcus pyogenes NOT DETECTED NOT DETECTED Final   A.calcoaceticus-baumannii NOT DETECTED NOT DETECTED Final   Bacteroides fragilis NOT DETECTED NOT DETECTED Final   Enterobacterales NOT DETECTED NOT DETECTED Final   Enterobacter cloacae complex NOT DETECTED NOT DETECTED Final   Escherichia coli NOT DETECTED NOT DETECTED Final   Klebsiella aerogenes NOT DETECTED NOT DETECTED Final   Klebsiella oxytoca NOT DETECTED NOT DETECTED Final   Klebsiella pneumoniae NOT DETECTED NOT DETECTED Final   Proteus species NOT DETECTED NOT DETECTED Final   Salmonella species NOT DETECTED NOT DETECTED Final   Serratia marcescens NOT DETECTED NOT DETECTED Final   Haemophilus influenzae NOT DETECTED NOT DETECTED Final   Neisseria meningitidis NOT DETECTED NOT DETECTED Final   Pseudomonas aeruginosa NOT DETECTED NOT DETECTED Final   Stenotrophomonas maltophilia NOT DETECTED NOT DETECTED Final   Candida albicans NOT DETECTED NOT DETECTED Final   Candida auris NOT DETECTED NOT DETECTED Final   Candida glabrata NOT DETECTED NOT DETECTED Final   Candida krusei NOT DETECTED NOT DETECTED Final   Candida  parapsilosis NOT DETECTED NOT DETECTED Final   Candida tropicalis NOT DETECTED NOT DETECTED Final   Cryptococcus neoformans/gattii NOT DETECTED NOT DETECTED Final    Comment: Performed at [facility], [address] [place], [place] [address]     Time coordinating discharge:  I have spent 35 minutes face to face with the patient and on the ward discussing the patients care, assessment, plan and disposition with other care givers. >50% of the time was devoted counseling the patient about the risks and benefits of treatment/Discharge disposition and coordinating care.   SIGNED:   Miguel Rota, MD  [facility] 12/06/2022, 12:31 PM   If 7PM-7AM, please contact night-coverage

## 2022-12-06 NOTE — Care Management Important Message (Signed)
Important Message  Patient Details  Name: [name] MRN: [id number] Date of Birth: 1940/05/02   Important Message Given:  Yes - Medicare IM     Dorena Bodo 12/06/2022, 2:01 PM

## 2022-12-06 NOTE — Care Management Important Message (Signed)
Important Message  Patient Details  Name: [name] MRN: [id number] Date of Birth: 05/13/1940   Important Message Given:  Yes - Medicare IM     Dorena Bodo 12/06/2022, 1:56 PM

## 2022-12-06 NOTE — Progress Notes (Signed)
Mobility Specialist Progress Note:    12/06/22 0931  Mobility  Activity Transferred from bed to chair  Level of Assistance Moderate assist, patient does 50-74%  Assistive Device Front wheel walker  Distance Ambulated (ft) 3 ft  Activity Response Tolerated well  Mobility Referral Yes  $Mobility charge 1 Mobility  Mobility Specialist Start Time (ACUTE ONLY) [id number]  Mobility Specialist Stop Time (ACUTE ONLY) 0930  Mobility Specialist Time Calculation (min) (ACUTE ONLY) 9 min   Pt received in bed, agreeable to transfer B>C via RW. Required ModA to sit EOB, CGA to stand. Tolerated well, asx throughout. All needs met, call bell in reach.   Feliciana Rossetti Mobility Specialist Please contact via Special educational needs teacher or  Rehab office at [phone]

## 2022-12-07 ENCOUNTER — Telehealth: Payer: Self-pay

## 2022-12-07 NOTE — Transitions of Care (Post Inpatient/ED Visit) (Signed)
   12/07/2022  Name: [name] MRN: [id number] DOB: [birth year]  Today's TOC FU Call Status: Today's TOC FU Call Status:: Unsuccessful Call (1st Attempt) Unsuccessful Call (1st Attempt) Date: 12/07/22  Attempted to reach the patient regarding the most recent Inpatient/ED visit.  Follow Up Plan: Additional outreach attempts will be made to reach the patient to complete the Transitions of Care (Post Inpatient/ED visit) call.   Jodelle Gross RN, BSN, CCM RN Care Manager  Transitions of Care  [facility]  [phone]

## 2022-12-07 NOTE — Telephone Encounter (Signed)
May have open slot, or may add to Tuesday afternoon however you wish please

## 2022-12-08 LAB — CULTURE, BLOOD (ROUTINE X 2): Culture: NO GROWTH

## 2022-12-09 DIAGNOSIS — E785 Hyperlipidemia, unspecified: Secondary | ICD-10-CM | POA: Diagnosis not present

## 2022-12-09 DIAGNOSIS — N179 Acute kidney failure, unspecified: Secondary | ICD-10-CM | POA: Diagnosis not present

## 2022-12-09 DIAGNOSIS — I130 Hypertensive heart and chronic kidney disease with heart failure and stage 1 through stage 4 chronic kidney disease, or unspecified chronic kidney disease: Secondary | ICD-10-CM | POA: Diagnosis not present

## 2022-12-09 DIAGNOSIS — E212 Other hyperparathyroidism: Secondary | ICD-10-CM | POA: Diagnosis not present

## 2022-12-09 DIAGNOSIS — M459 Ankylosing spondylitis of unspecified sites in spine: Secondary | ICD-10-CM | POA: Diagnosis not present

## 2022-12-09 DIAGNOSIS — G4733 Obstructive sleep apnea (adult) (pediatric): Secondary | ICD-10-CM | POA: Diagnosis not present

## 2022-12-09 DIAGNOSIS — N1831 Chronic kidney disease, stage 3a: Secondary | ICD-10-CM | POA: Diagnosis not present

## 2022-12-09 DIAGNOSIS — E559 Vitamin D deficiency, unspecified: Secondary | ICD-10-CM | POA: Diagnosis not present

## 2022-12-09 DIAGNOSIS — R7401 Elevation of levels of liver transaminase levels: Secondary | ICD-10-CM | POA: Diagnosis not present

## 2022-12-09 DIAGNOSIS — E611 Iron deficiency: Secondary | ICD-10-CM | POA: Diagnosis not present

## 2022-12-09 DIAGNOSIS — M1990 Unspecified osteoarthritis, unspecified site: Secondary | ICD-10-CM | POA: Diagnosis not present

## 2022-12-09 DIAGNOSIS — M47812 Spondylosis without myelopathy or radiculopathy, cervical region: Secondary | ICD-10-CM | POA: Diagnosis not present

## 2022-12-09 DIAGNOSIS — J4489 Other specified chronic obstructive pulmonary disease: Secondary | ICD-10-CM | POA: Diagnosis not present

## 2022-12-09 DIAGNOSIS — J439 Emphysema, unspecified: Secondary | ICD-10-CM | POA: Diagnosis not present

## 2022-12-09 DIAGNOSIS — I5032 Chronic diastolic (congestive) heart failure: Secondary | ICD-10-CM | POA: Diagnosis not present

## 2022-12-09 DIAGNOSIS — J9611 Chronic respiratory failure with hypoxia: Secondary | ICD-10-CM | POA: Diagnosis not present

## 2022-12-09 DIAGNOSIS — I2510 Atherosclerotic heart disease of native coronary artery without angina pectoris: Secondary | ICD-10-CM | POA: Diagnosis not present

## 2022-12-09 DIAGNOSIS — M8580 Other specified disorders of bone density and structure, unspecified site: Secondary | ICD-10-CM | POA: Diagnosis not present

## 2022-12-09 DIAGNOSIS — A411 Sepsis due to other specified staphylococcus: Secondary | ICD-10-CM | POA: Diagnosis not present

## 2022-12-09 DIAGNOSIS — R7303 Prediabetes: Secondary | ICD-10-CM | POA: Diagnosis not present

## 2022-12-09 DIAGNOSIS — K5792 Diverticulitis of intestine, part unspecified, without perforation or abscess without bleeding: Secondary | ICD-10-CM | POA: Diagnosis not present

## 2022-12-10 ENCOUNTER — Telehealth: Payer: Self-pay

## 2022-12-10 NOTE — Telephone Encounter (Signed)
Called and spoke to Daughter (DPR) [name]. Daughter stated that patient needs concentrator for in-home oxygen and portable concentrator for oxygen use. As well as C-Pap supplies. Printed progress note and waiting for Dr Lorin Picket to sign prescription so it can be faxed to [facility].

## 2022-12-10 NOTE — Telephone Encounter (Signed)
Faxed information to [facility] Fax [phone]

## 2022-12-10 NOTE — Transitions of Care (Post Inpatient/ED Visit) (Signed)
   12/10/2022  Name: [name] MRN: [id number] DOB: 1940-08-11  Today's TOC FU Call Status: Today's TOC FU Call Status:: Unsuccessful Call (2nd Attempt) Unsuccessful Call (2nd Attempt) Date: 12/10/22  Attempted to reach the patient regarding the most recent Inpatient/ED visit.  Follow Up Plan: Additional outreach attempts will be made to reach the patient to complete the Transitions of Care (Post Inpatient/ED visit) call.   Alyse Low, RN, BA, [facility], CRRN [facility] Coordinator, Transition of Care Ph # [phone]

## 2022-12-11 ENCOUNTER — Telehealth: Payer: Self-pay | Admitting: Family Medicine

## 2022-12-11 ENCOUNTER — Ambulatory Visit: Payer: Self-pay | Admitting: *Deleted

## 2022-12-11 DIAGNOSIS — J449 Chronic obstructive pulmonary disease, unspecified: Secondary | ICD-10-CM

## 2022-12-11 DIAGNOSIS — I5032 Chronic diastolic (congestive) heart failure: Secondary | ICD-10-CM

## 2022-12-11 DIAGNOSIS — J9621 Acute and chronic respiratory failure with hypoxia: Secondary | ICD-10-CM

## 2022-12-11 DIAGNOSIS — I169 Hypertensive crisis, unspecified: Secondary | ICD-10-CM

## 2022-12-11 NOTE — Telephone Encounter (Signed)
May have verbal order °

## 2022-12-11 NOTE — Patient Instructions (Addendum)
Visit Information  Thank you for taking time to visit with me today. Please don't hesitate to contact me if I can be of assistance to you.   Following are the goals we discussed today:   Goals Addressed             This Visit's Progress    Receive Assistance Obtaining In-Home Care Services.   On track    Care Coordination Interventions:  Interventions Today    Flowsheet Row Most Recent Value  Chronic Disease   Chronic disease during today's visit Chronic Obstructive Pulmonary Disease (COPD), Congestive Heart Failure (CHF), Hypertension (HTN), Chronic Kidney Disease/End Stage Renal Disease (ESRD), Other  [Morbid Obesity, Major Depression, Single Episode, Moderate, Prediabetes, Daughter with Caregiver Burnout/Stress/Fatigue, Inability to Perform Activities of Daily Living Independently, Ulcerative Collitus, Memory Deficits]  General Interventions   General Interventions Discussed/Reviewed General Interventions Discussed, Durable Medical Equipment (DME), Communication with, Level of Care, General Interventions Reviewed, [employer], Labs  Labs Hgb A1c every 3 months, Kidney Function  Doctor Visits Discussed/Reviewed Doctor Visits Discussed, Doctor Visits Reviewed, Annual Wellness Visits, PCP, Database administrator (DME) Bed side commode, BP Cuff, Environmental consultant, Wheelchair, Tour manager, Other  [Hand-Held Chief Strategy Officer, Incontinence Pads & Depends]  Wheelchair Standard  PCP/Specialist Visits Compliance with follow-up visit  Communication with PCP/Specialists, RN  Level of Care Adult Daycare, Applications, Assisted Living, Skilled Nursing Facility, Teaching laboratory technician Medicaid, Personal Care Services, FL-2  Exercise Interventions   Exercise Discussed/Reviewed Exercise Discussed, Assistive device use and maintanence, Weight Managment, Physical Activity, Exercise Reviewed  Physical Activity Discussed/Reviewed Physical Activity Discussed, Physical Activity  Reviewed, Types of exercise, Home Exercise Program (HEP), PREP, Gym  Weight Management Weight loss  Education Interventions   Education Provided Provided Therapist, sports, Provided Web-based Education, Provided Education  Provided Verbal Education On Nutrition, Exercise, Mental Health/Coping with Illness, Air traffic controller, Development worker, community, [employer], When to see the Scientist, research (physical sciences) Medicaid, Personal Care Services, FL-2  Mental Health Interventions   Mental Health Discussed/Reviewed Mental Health Discussed, Substance Abuse, Grief and Loss, Depression, Anxiety, Crisis, Coping Strategies, Mental Health Reviewed, Suicide, Other  Nutrition Interventions   Nutrition Discussed/Reviewed Nutrition Discussed, Portion sizes, Decreasing sugar intake, Nutrition Reviewed, Increasing proteins, Carbohydrate meal planning, Decreasing fats, Adding fruits and vegetables, Decreasing salt, Fluid intake  Pharmacy Interventions   Pharmacy Dicussed/Reviewed Pharmacy Topics Discussed, Pharmacy Topics Reviewed, Medications and their functions, Medication Adherence, Affording Medications  Medication Adherence --  [Medication Compliance According to Daughter/24 Hour Caregiver]  Safety Interventions   Safety Discussed/Reviewed Safety Discussed, Safety Reviewed, Fall Risk, Home Safety  Home Safety Assistive Devices, Need for home safety assessment, Refer for home visit, Contact provider for referral to PT/OT, Refer for community resources, Contact home health agency  Advanced Directive Interventions   Advanced Directives Discussed/Reviewed Advanced Directives Discussed      Active Listening & Reflection Utilized.  Verbalization of Feelings Encouraged.  Emotional Support Provided. Problem Solving Interventions Employed. Solution-Focused Strategies Implemented. Acceptance & Commitment Therapy Initiated. Cognitive Behavioral Therapy Performed. CSW Collaboration with Daughter, [name] to Confirm  Patient's Discharge from [facility] on 12/06/2022, Returning Home to Live with Daughter.  CSW Collaboration with Daughter, [name] to [facility] Physical & Occupational Therapy Services, as Well as Home Health Social Work Services, Have Been Arranged through [facility] [phone]). CSW Collaboration with Daughter, [name] to Confirm Orders for In-Home Oxygen Concentrator, Portable Oxygen Concentrator & C-Pap Supplies Have Been Faxed to [employer] (#  [phone]). CSW Collaboration with Daughter, [name] to Encourage Routine Engagement with Representative from Aging, Disability & Transit Services of [place] [phone]), to Check Status of Patient's Name on Waiting List to Receive Prepared Meal Delivery Services. CSW Collaboration with Daughter, [name] to Encourage Routine Engagement with Representative from Aging, Disability & Transit Services of [place] [phone]), to Check Status of Patient's Name on Waiting List to Receive [facility].  CSW Energy manager from Aging, Disability & Transit Services of [place] [phone]), to Confirm No Additional Funds Available this Fiscal Year for [employer].  CSW Collaboration with Representative from [employer] [phone]), to Place Patient's Name on Waiting List to Receive Reduced Network engineer. CSW Collaboration with Daughter, [name] to Encourage Routine Engagement with Representative from [employer] [phone]), to Check Status of Patient's Name on Waiting List to Receive Reduced Cost Wheelchair Ramp Installation.  CSW Collaboration with Daughter, [name] to [employer] with Representative from [facility] RAMMP: Ramp [employer], through [place] Department of Health & Human Services  (# [phone]), to Inquire About Renting a Reduced Cost Ramp Until Permanent Ramp is Installed. CSW Collaboration with Daughter, [name] to [employer] with CSW [phone]), if She Has Questions, Needs Assistance, or If Additional Social Work Needs Are Identified Between Now & Our Next Follow-Up Outreach Call, Scheduled on 12/24/2022 at 10:30 AM.      Our next appointment is by telephone on 12/24/2022 at 10:30 am.  Please call the care guide team at [phone] if you need to cancel or reschedule your appointment.   If you are experiencing a Mental Health or Behavioral Health Crisis or need someone to talk to, please call the Suicide and Crisis Lifeline: 988 call the [place] National Suicide Prevention Lifeline: (502)135-9323 or TTY: 220 411 5105 TTY (731) 075-1854) to talk to a trained counselor call 1-800-273-TALK (toll free, 24 hour hotline) go to [facility] [address] [place] [phone]) call the [facility] Crisis Line: 631-273-0892 call 911  Patient verbalizes understanding of instructions and care plan provided today and agrees to view in MyChart. Active MyChart status and patient understanding of how to access instructions and care plan via MyChart confirmed with patient.     Telephone follow up appointment with care management team member scheduled for:  12/24/2022 at 10:30 am.  Danford Bad, BSW, MSW, LCSW  Embedded Practice Social Work Case Manager  [facility], Population Health Direct Dial: [phone]  Fax: [phone] Email: [email or web address] Website: [email or web address]

## 2022-12-11 NOTE — Patient Outreach (Addendum)
Care Coordination   Follow Up Visit Note   12/11/2022  Name: [name] MRN: [id number] DOB: [birth year]  [name] is a 82 y.o. year old male who sees Luking, [name], MD for primary care. I spoke with patient's daughter, [name] by phone today.  What matters to the patients health and wellness today?  Receive Assistance Obtaining In-Home Care Services.   Goals Addressed             This Visit's Progress    Receive Assistance Obtaining In-Home Care Services.   On track    Care Coordination Interventions:  Interventions Today    Flowsheet Row Most Recent Value  Chronic Disease   Chronic disease during today's visit Chronic Obstructive Pulmonary Disease (COPD), Congestive Heart Failure (CHF), Hypertension (HTN), Chronic Kidney Disease/End Stage Renal Disease (ESRD), Other  [Morbid Obesity, Major Depression, Single Episode, Moderate, Prediabetes, Daughter with Caregiver Burnout/Stress/Fatigue, Inability to Perform Activities of Daily Living Independently, Ulcerative Collitus, Memory Deficits]  General Interventions   General Interventions Discussed/Reviewed General Interventions Discussed, Durable Medical Equipment (DME), Communication with, Level of Care, General Interventions Reviewed, [employer], Labs  Labs Hgb A1c every 3 months, Kidney Function  Doctor Visits Discussed/Reviewed Doctor Visits Discussed, Doctor Visits Reviewed, Annual Wellness Visits, PCP, Database administrator (DME) Bed side commode, BP Cuff, Environmental consultant, Wheelchair, Tour manager, Other  [Hand-Held Chief Strategy Officer, Incontinence Pads & Depends]  Wheelchair Standard  PCP/Specialist Visits Compliance with follow-up visit  Communication with PCP/Specialists, RN  Level of Care Adult Daycare, Applications, Assisted Living, Skilled Nursing Facility, Teaching laboratory technician Medicaid, Personal Care Services, FL-2  Exercise Interventions   Exercise Discussed/Reviewed  Exercise Discussed, Assistive device use and maintanence, Weight Managment, Physical Activity, Exercise Reviewed  Physical Activity Discussed/Reviewed Physical Activity Discussed, Physical Activity Reviewed, Types of exercise, Home Exercise Program (HEP), PREP, Gym  Weight Management Weight loss  Education Interventions   Education Provided Provided Therapist, sports, Provided Web-based Education, Provided Education  Provided Verbal Education On Nutrition, Exercise, Mental Health/Coping with Illness, Air traffic controller, Development worker, community, [employer], When to see the Scientist, research (physical sciences) Medicaid, Personal Care Services, [place]-2  Mental Health Interventions   Mental Health Discussed/Reviewed Mental Health Discussed, Substance Abuse, Grief and Loss, Depression, Anxiety, Crisis, Coping Strategies, Mental Health Reviewed, Suicide, Other  Nutrition Interventions   Nutrition Discussed/Reviewed Nutrition Discussed, Portion sizes, Decreasing sugar intake, Nutrition Reviewed, Increasing proteins, Carbohydrate meal planning, Decreasing fats, Adding fruits and vegetables, Decreasing salt, Fluid intake  Pharmacy Interventions   Pharmacy Dicussed/Reviewed Pharmacy Topics Discussed, Pharmacy Topics Reviewed, Medications and their functions, Medication Adherence, Affording Medications  Medication Adherence --  [Medication Compliance According to Daughter/24 Hour Caregiver]  Safety Interventions   Safety Discussed/Reviewed Safety Discussed, Safety Reviewed, Fall Risk, Home Safety  Home Safety Assistive Devices, Need for home safety assessment, Refer for home visit, Contact provider for referral to PT/OT, Refer for community resources, Contact home health agency  Advanced Directive Interventions   Advanced Directives Discussed/Reviewed Advanced Directives Discussed      Active Listening & Reflection Utilized.  Verbalization of Feelings Encouraged.  Emotional Support Provided. Problem Solving Interventions  Employed. Solution-Focused Strategies Implemented. Acceptance & Commitment Therapy Initiated. Cognitive Behavioral Therapy Performed. CSW Collaboration with Daughter, [name] to Confirm Patient's Discharge from [facility] on 12/06/2022, Returning Home to Live with Daughter.  CSW Collaboration with Daughter, [name] to [facility] Physical & Occupational Therapy Services, as Well as Home Health Social Work Services, Have Been Arranged through  [facility] [phone]). CSW Collaboration with Daughter, [name] to Confirm Orders for In-Home Oxygen Concentrator, Portable Oxygen Concentrator & C-Pap Supplies Have Been Faxed to [facility] [phone]). CSW Collaboration with Daughter, [name] to Encourage Routine Engagement with Representative from Aging, Disability & Transit Services of [place] [phone]), to Check Status of Patient's Name on Waiting List to Receive Prepared Meal Delivery Services. CSW Collaboration with Daughter, [name] to Encourage Routine Engagement with Representative from Aging, Disability & Transit Services of [place] [phone]), to Check Status of Patient's Name on Waiting List to Receive [facility].  CSW Energy manager from Aging, Disability & Transit Services of [place] [phone]), to Confirm No Additional Funds Available this Fiscal Year for [employer].  CSW Collaboration with Representative from [employer] [phone]), to Place Patient's Name on Waiting List to Receive Reduced Network engineer. CSW Collaboration with Daughter, [name] to Encourage Routine Engagement with Representative from [employer] [phone]), to Check Status of Patient's Name on Waiting List to Receive Reduced Cost Wheelchair Ramp  Installation.  CSW Collaboration with Daughter, [name] to [employer] with Representative from [facility] RAMMP: Ramp [employer], through [place] Department of Health & Human Services (# [phone]), to Inquire About Renting a Reduced Cost Ramp Until Permanent Ramp is Installed. CSW Collaboration with Daughter, [name] to [employer] with CSW [phone]), if She Has Questions, Needs Assistance, or If Additional Social Work Needs Are Identified Between Now & Our Next Follow-Up Outreach Call, Scheduled on 12/24/2022 at 10:30 AM.      SDOH assessments and interventions completed:  Yes.  Care Coordination Interventions:  Yes, provided.   Follow up plan: Follow up call scheduled for 12/24/2022 at 10:30 am.  Encounter Outcome:  Patient Visit Completed.   Danford Bad, BSW, MSW, Printmaker Social Work Case Set designer Health  [facility], Population Health Direct Dial: [phone]  Fax: [phone] Email: [email or web address] Website: [email or web address]

## 2022-12-11 NOTE — Telephone Encounter (Signed)
[facility] needing verbal orders for twice a week for eight weeks and 1 once a week  for one week Home health aid for 1 once a week for 8 weeks, [phone]

## 2022-12-12 ENCOUNTER — Telehealth: Payer: Self-pay | Admitting: Family Medicine

## 2022-12-12 ENCOUNTER — Telehealth: Payer: Self-pay

## 2022-12-12 DIAGNOSIS — M1990 Unspecified osteoarthritis, unspecified site: Secondary | ICD-10-CM | POA: Diagnosis not present

## 2022-12-12 DIAGNOSIS — R238 Other skin changes: Secondary | ICD-10-CM

## 2022-12-12 DIAGNOSIS — K5792 Diverticulitis of intestine, part unspecified, without perforation or abscess without bleeding: Secondary | ICD-10-CM | POA: Diagnosis not present

## 2022-12-12 DIAGNOSIS — E785 Hyperlipidemia, unspecified: Secondary | ICD-10-CM | POA: Diagnosis not present

## 2022-12-12 DIAGNOSIS — I130 Hypertensive heart and chronic kidney disease with heart failure and stage 1 through stage 4 chronic kidney disease, or unspecified chronic kidney disease: Secondary | ICD-10-CM | POA: Diagnosis not present

## 2022-12-12 DIAGNOSIS — M459 Ankylosing spondylitis of unspecified sites in spine: Secondary | ICD-10-CM | POA: Diagnosis not present

## 2022-12-12 DIAGNOSIS — E611 Iron deficiency: Secondary | ICD-10-CM | POA: Diagnosis not present

## 2022-12-12 DIAGNOSIS — N1831 Chronic kidney disease, stage 3a: Secondary | ICD-10-CM | POA: Diagnosis not present

## 2022-12-12 DIAGNOSIS — I2510 Atherosclerotic heart disease of native coronary artery without angina pectoris: Secondary | ICD-10-CM | POA: Diagnosis not present

## 2022-12-12 DIAGNOSIS — N179 Acute kidney failure, unspecified: Secondary | ICD-10-CM | POA: Diagnosis not present

## 2022-12-12 DIAGNOSIS — E559 Vitamin D deficiency, unspecified: Secondary | ICD-10-CM | POA: Diagnosis not present

## 2022-12-12 DIAGNOSIS — E212 Other hyperparathyroidism: Secondary | ICD-10-CM | POA: Diagnosis not present

## 2022-12-12 DIAGNOSIS — J439 Emphysema, unspecified: Secondary | ICD-10-CM | POA: Diagnosis not present

## 2022-12-12 DIAGNOSIS — R7303 Prediabetes: Secondary | ICD-10-CM | POA: Diagnosis not present

## 2022-12-12 DIAGNOSIS — J9611 Chronic respiratory failure with hypoxia: Secondary | ICD-10-CM | POA: Diagnosis not present

## 2022-12-12 DIAGNOSIS — J4489 Other specified chronic obstructive pulmonary disease: Secondary | ICD-10-CM | POA: Diagnosis not present

## 2022-12-12 DIAGNOSIS — A411 Sepsis due to other specified staphylococcus: Secondary | ICD-10-CM | POA: Diagnosis not present

## 2022-12-12 DIAGNOSIS — M47812 Spondylosis without myelopathy or radiculopathy, cervical region: Secondary | ICD-10-CM | POA: Diagnosis not present

## 2022-12-12 DIAGNOSIS — R7401 Elevation of levels of liver transaminase levels: Secondary | ICD-10-CM | POA: Diagnosis not present

## 2022-12-12 DIAGNOSIS — M8580 Other specified disorders of bone density and structure, unspecified site: Secondary | ICD-10-CM | POA: Diagnosis not present

## 2022-12-12 DIAGNOSIS — G4733 Obstructive sleep apnea (adult) (pediatric): Secondary | ICD-10-CM | POA: Diagnosis not present

## 2022-12-12 DIAGNOSIS — I5032 Chronic diastolic (congestive) heart failure: Secondary | ICD-10-CM | POA: Diagnosis not present

## 2022-12-12 NOTE — Telephone Encounter (Signed)
Duplicate request. See other message

## 2022-12-12 NOTE — Telephone Encounter (Signed)
[name] from [facility] called and advised per Dr Lorin Picket, give verbal orders.  [facility][name] needing verbal orders for twice a week for eight weeks and 1 once a week  for one week Home health aid for 1 once a week for 8 weeks, [phone]  Byrd Hesselbach verbalized understanding.

## 2022-12-12 NOTE — Patient Outreach (Signed)
duplicate

## 2022-12-12 NOTE — Transitions of Care (Post Inpatient/ED Visit) (Signed)
12/12/2022  Name: [name] MRN: [id number] DOB: 1940/06/13  Today's TOC FU Call Status: Today's TOC FU Call Status:: Successful TOC FU Call Completed TOC FU Call Complete Date: 12/12/22 Patient's Name and Date of Birth confirmed.  Transition Care Management Follow-up Telephone Call Date of Discharge: 12/06/22 Discharge Facility: [facility]) Type of Discharge: Inpatient Admission Primary Inpatient Discharge Diagnosis:: Sepsis with encephalopathy How have you been since you were released from the hospital?: Better (Per patients daughter, he is doing a little better.) Any questions or concerns?: Yes Patient Questions/Concerns:: Patients daughter notified this RN that when she was cleaning up her dad from a bowel movement and a bunch of skin came off.  His bottom is very red, excoriated.  He has been on a lot of antibiotics with diarrhea.  She also noted that his left foot/ankle is swollen and he is favoring it. Patient Questions/Concerns Addressed: Notified Provider of Patient Questions/Concerns (Messaged Dr. Gerda Diss to see if a nurse could be added to the [facility] order.  MD approved nurse visit and he will have his staff notify [facility]. This Clinical research associate also called [facility] to let them know about the nursing need and order in hopes of expediting)  Items Reviewed: Did you receive and understand the discharge instructions provided?: Yes Medications obtained,verified, and reconciled?: Yes (Medications Reviewed) Any new allergies since your discharge?: No Dietary orders reviewed?: No Do you have support at home?: Yes People in Home: child(ren), adult Name of Support/Comfort Primary Source: [name]  Medications Reviewed Today: Medications Reviewed Today     Reviewed by Jodelle Gross, RN (Case Manager) on 12/12/22 at 1545  Med List Status: <None>   Medication Order Taking? Sig Documenting Provider Last Dose Status Informant  acetaminophen (TYLENOL) 500 MG tablet [id number] Yes Take  2 tablets (1,000 mg total) by mouth every 6 (six) hours as needed for headache (pain). Babs Sciara, MD Taking Active Family Member  albuterol (VENTOLIN HFA) 108 (90 Base) MCG/ACT inhaler [id number] Yes Inhale 1-2 puffs into the lungs every 6 (six) hours as needed for wheezing or shortness of breath. Miguel Rota, MD Taking Active   amLODipine (NORVASC) 5 MG tablet [id number] Yes Take one tablet po daily Luking, [name], MD Taking Active Family Member, Pharmacy Records  aspirin EC 81 MG tablet [id number] Yes Take 81 mg by mouth daily. Swallow whole. [provider] Taking Active Family Member  carvedilol (COREG) 12.5 MG tablet [id number] Yes Take 1 tablet (12.5 mg total) by mouth 2 (two) times daily with a meal. Gerda Diss, [name], MD Taking Active Family Member, Pharmacy Records  DULoxetine (CYMBALTA) 60 MG capsule [id number] Yes TAKE 1 CAPSULE BY MOUTH DAILY Gerda Diss, [name], MD Taking Active Family Member, Pharmacy Records  hydrALAZINE (APRESOLINE) 50 MG tablet [id number] Yes TAKE 1 TABLET(50 MG) BY MOUTH THREE TIMES DAILY Luking, [name], MD Taking Active Family Member, Pharmacy Records  isosorbide mononitrate (IMDUR) 30 MG 24 hr tablet [id number] Yes TAKE 3 TABLETS(90 MG) BY MOUTH DAILY Luking, [name], MD Taking Active Family Member, Pharmacy Records  nitroGLYCERIN (NITROSTAT) 0.4 MG SL tablet [id number] Yes ONE TABLET UNDER TONGUE AS NEEDED FOR CHEST PAIN EVERY 5 MINUTES AS DIRECTED  Patient taking differently: Place 0.4 mg under the tongue every 5 (five) minutes as needed for chest pain.   Runell Gess, MD Taking Active Pharmacy Records, Family Member  pravastatin (PRAVACHOL) 40 MG tablet [id number] Yes 1 qd Babs Sciara, MD Taking Active Family Member, Pharmacy Records  senna-docusate (  SENOKOT-S) 8.6-50 MG tablet [id number] Yes Take 1 tablet by mouth at bedtime as needed for moderate constipation. Miguel Rota, MD Taking Active   spironolactone (ALDACTONE) 25 MG tablet [id number] Yes  Take 0.5 tablets (12.5 mg total) by mouth daily. Babs Sciara, MD Taking Active Family Member, Pharmacy Records  torsemide [facility]) 20 MG tablet [id number] Yes 1 qam and may take 2 pill every day prn if weight gain greater than 3 pounds in a week  Patient taking differently: Take 20 mg by mouth daily. May take 2 tablets every day as needed if weight gain greater than 3 pounds in a week   Luking, [name], MD Taking Active Family Member, Pharmacy Records  Vitamin D, Ergocalciferol, (DRISDOL) 1.25 MG (50000 UNIT) CAPS capsule [id number] Yes TAKE 1 CAPSULE BY MOUTH EVERY 7 DAYS  Patient taking differently: Take 50,000 Units by mouth every Monday.   Babs Sciara, MD Taking Active Family Member, Pharmacy Records  Med List Note Larrie Kass, [place] 01/16/22 2311): Angie helps with meds - patient has difficulty swallowing large pills (Barium test 01/24/22)            Home Care and Equipment/Supplies: Were Home Health Services Ordered?: Yes Name of Home Health Agency:: [facility] Has Agency set up a time to come to your home?: Yes First Home Health Visit Date: 12/12/22 Any new equipment or medical supplies ordered?: No  Functional Questionnaire: Do you need assistance with bathing/showering or dressing?: Yes Do you need assistance with meal preparation?: Yes Do you need assistance with eating?: No Do you have difficulty maintaining continence: Yes Do you need assistance with getting out of bed/getting out of a chair/moving?: Yes Do you have difficulty managing or taking your medications?: Yes  Follow up appointments reviewed: PCP Follow-up appointment confirmed?: Yes Date of PCP follow-up appointment?: 12/21/22 Follow-up Provider: Dr. Gerda Diss Specialist [facility] Follow-up appointment confirmed?: NA Do you need transportation to your follow-up appointment?: No Do you understand care options if your condition(s) worsen?: Yes-patient verbalized understanding   Jodelle Gross RN, BSN,  CCM RN Care Manager  Transitions of Care  [facility] - Population [facility]  [phone]

## 2022-12-12 NOTE — Telephone Encounter (Signed)
Called Byrd Hesselbach and left message to call office. (Nurse Note*  [facility] needing verbal orders for twice a week for eight weeks and 1 once a week  for one week Home health aid for 1 once a week for 8 weeks, [phone]  Dr Lorin Picket advised go ahead with verbal orders.)

## 2022-12-12 NOTE — Telephone Encounter (Signed)
Good afternoon, I just spoke with Mr. [name] daughter and she stated when she went to clean up her dad, the skin around his bottom just peeled off and is excoriated. Home Health is seeing him for PT/OT, can we get an order for a nurse to see him as well? He also has swelling in left foot.   DB I am a Transition of Care RN   Nurses-please work with his home health agency need to have nurse visit because of excoriations on the bottom as well as pedal edema He does have follow-up office visit with [place] that he should keep thank you follow-up sooner problems

## 2022-12-12 NOTE — Telephone Encounter (Signed)
(  1 time previously we did this and it got rejected-the reason it was rejected is at the time he had access to a bathroom It would be wise to talk with family find out is he limited with mobility.  And verify that he does not have access to a bathroom then go ahead and write a prescription for a bariatric commode I will sign it and then fax it accordingly thank you)

## 2022-12-12 NOTE — Telephone Encounter (Signed)
Please go ahead and give order, fill out prescription I will sign it and fax it thank you

## 2022-12-12 NOTE — Telephone Encounter (Signed)
[name] from [facility]. Patient needs order for bariatric commode.

## 2022-12-13 ENCOUNTER — Telehealth: Payer: Self-pay | Admitting: *Deleted

## 2022-12-13 NOTE — Telephone Encounter (Signed)
Nurses-may have verbal order for occupational therapy May also have verbal order to initiate home health nurse visits Patient has an office visit here with [place] on 1115

## 2022-12-13 NOTE — Telephone Encounter (Signed)
See MyChart message

## 2022-12-13 NOTE — Telephone Encounter (Signed)
Called and spoke with patient's wife and informed her she should be contacted in the next 2-3 days by home health. Wife informed if she has not heard anything in the next 2-3 days to contact our office.

## 2022-12-13 NOTE — Telephone Encounter (Signed)
Source  [name] (Patient)   Subject  [name], [name] (Patient)   Topic  Clinical - Medical Advice    Communication  Reason for CRM: Rosanne Ashing [facility] [phone]. Calling for patient to get verbal orders to begin occupational therapy with patient. Also has concerns and questions about possible home health care.    They are concerned about patient states he is not eating and they were told he has sores on his back and they would like examination from a nurse

## 2022-12-13 NOTE — Telephone Encounter (Signed)
Verbal orders given to [facility] at [facility]

## 2022-12-14 ENCOUNTER — Telehealth: Payer: Self-pay | Admitting: *Deleted

## 2022-12-14 ENCOUNTER — Telehealth: Payer: Self-pay

## 2022-12-14 DIAGNOSIS — M459 Ankylosing spondylitis of unspecified sites in spine: Secondary | ICD-10-CM | POA: Diagnosis not present

## 2022-12-14 DIAGNOSIS — M8580 Other specified disorders of bone density and structure, unspecified site: Secondary | ICD-10-CM | POA: Diagnosis not present

## 2022-12-14 DIAGNOSIS — E559 Vitamin D deficiency, unspecified: Secondary | ICD-10-CM | POA: Diagnosis not present

## 2022-12-14 DIAGNOSIS — N179 Acute kidney failure, unspecified: Secondary | ICD-10-CM | POA: Diagnosis not present

## 2022-12-14 DIAGNOSIS — N1831 Chronic kidney disease, stage 3a: Secondary | ICD-10-CM | POA: Diagnosis not present

## 2022-12-14 DIAGNOSIS — I130 Hypertensive heart and chronic kidney disease with heart failure and stage 1 through stage 4 chronic kidney disease, or unspecified chronic kidney disease: Secondary | ICD-10-CM | POA: Diagnosis not present

## 2022-12-14 DIAGNOSIS — M1990 Unspecified osteoarthritis, unspecified site: Secondary | ICD-10-CM | POA: Diagnosis not present

## 2022-12-14 DIAGNOSIS — J4489 Other specified chronic obstructive pulmonary disease: Secondary | ICD-10-CM | POA: Diagnosis not present

## 2022-12-14 DIAGNOSIS — E212 Other hyperparathyroidism: Secondary | ICD-10-CM | POA: Diagnosis not present

## 2022-12-14 DIAGNOSIS — I2510 Atherosclerotic heart disease of native coronary artery without angina pectoris: Secondary | ICD-10-CM | POA: Diagnosis not present

## 2022-12-14 DIAGNOSIS — K5792 Diverticulitis of intestine, part unspecified, without perforation or abscess without bleeding: Secondary | ICD-10-CM | POA: Diagnosis not present

## 2022-12-14 DIAGNOSIS — M47812 Spondylosis without myelopathy or radiculopathy, cervical region: Secondary | ICD-10-CM | POA: Diagnosis not present

## 2022-12-14 DIAGNOSIS — E611 Iron deficiency: Secondary | ICD-10-CM | POA: Diagnosis not present

## 2022-12-14 DIAGNOSIS — E785 Hyperlipidemia, unspecified: Secondary | ICD-10-CM | POA: Diagnosis not present

## 2022-12-14 DIAGNOSIS — J9611 Chronic respiratory failure with hypoxia: Secondary | ICD-10-CM | POA: Diagnosis not present

## 2022-12-14 DIAGNOSIS — J439 Emphysema, unspecified: Secondary | ICD-10-CM | POA: Diagnosis not present

## 2022-12-14 DIAGNOSIS — G4733 Obstructive sleep apnea (adult) (pediatric): Secondary | ICD-10-CM | POA: Diagnosis not present

## 2022-12-14 DIAGNOSIS — R7401 Elevation of levels of liver transaminase levels: Secondary | ICD-10-CM | POA: Diagnosis not present

## 2022-12-14 DIAGNOSIS — A411 Sepsis due to other specified staphylococcus: Secondary | ICD-10-CM | POA: Diagnosis not present

## 2022-12-14 DIAGNOSIS — I5032 Chronic diastolic (congestive) heart failure: Secondary | ICD-10-CM | POA: Diagnosis not present

## 2022-12-14 DIAGNOSIS — R7303 Prediabetes: Secondary | ICD-10-CM | POA: Diagnosis not present

## 2022-12-14 NOTE — Telephone Encounter (Signed)
May have verbal

## 2022-12-14 NOTE — Telephone Encounter (Signed)
Called and spoke with [name] (DPR). [name] states that patient is unable to get in the bathroom. Patient needs bariatric commode. Prescription has been put on Dr Roby Lofts door to be signed.

## 2022-12-14 NOTE — Telephone Encounter (Signed)
Verbal order given to Marylene Land at [facility]

## 2022-12-14 NOTE — Telephone Encounter (Signed)
[facility] needs verbal orders for skilled Nursing 1 week x 4 and x 2 prn. Phone #[phone].

## 2022-12-14 NOTE — Addendum Note (Signed)
Addended by: Danford Bad D on: 12/14/2022 08:32 AM   Modules accepted: Orders

## 2022-12-14 NOTE — Progress Notes (Signed)
  Care Coordination Note  12/14/2022 Name: ADEEL LOUD MRN: [id number] DOB: [birth year]  CHAYSEN ELZINGA is a 82 y.o. year old male who is a primary care patient of Luking, [name], MD and is actively engaged with the care management team. I reached out to Janace Litten by phone today to assist with scheduling an initial visit with the BSW  Follow up plan: Telephone appointment with care management team member scheduled for:12/19/22  [facility] Coordination Care Guide  Direct Dial: [phone]

## 2022-12-15 NOTE — Telephone Encounter (Signed)
Prescription signed.

## 2022-12-17 ENCOUNTER — Telehealth: Payer: Self-pay

## 2022-12-17 NOTE — Telephone Encounter (Signed)
Faxed Script for bedside commode to [facility] Fax# [phone]

## 2022-12-17 NOTE — Telephone Encounter (Signed)
Pres[facility]ed to [facility] (Telephone Note)

## 2022-12-18 DIAGNOSIS — N179 Acute kidney failure, unspecified: Secondary | ICD-10-CM | POA: Diagnosis not present

## 2022-12-18 DIAGNOSIS — M1990 Unspecified osteoarthritis, unspecified site: Secondary | ICD-10-CM | POA: Diagnosis not present

## 2022-12-18 DIAGNOSIS — R7401 Elevation of levels of liver transaminase levels: Secondary | ICD-10-CM | POA: Diagnosis not present

## 2022-12-18 DIAGNOSIS — E212 Other hyperparathyroidism: Secondary | ICD-10-CM | POA: Diagnosis not present

## 2022-12-18 DIAGNOSIS — G4733 Obstructive sleep apnea (adult) (pediatric): Secondary | ICD-10-CM | POA: Diagnosis not present

## 2022-12-18 DIAGNOSIS — E611 Iron deficiency: Secondary | ICD-10-CM | POA: Diagnosis not present

## 2022-12-18 DIAGNOSIS — M47812 Spondylosis without myelopathy or radiculopathy, cervical region: Secondary | ICD-10-CM | POA: Diagnosis not present

## 2022-12-18 DIAGNOSIS — M8580 Other specified disorders of bone density and structure, unspecified site: Secondary | ICD-10-CM | POA: Diagnosis not present

## 2022-12-18 DIAGNOSIS — E559 Vitamin D deficiency, unspecified: Secondary | ICD-10-CM | POA: Diagnosis not present

## 2022-12-18 DIAGNOSIS — J4489 Other specified chronic obstructive pulmonary disease: Secondary | ICD-10-CM | POA: Diagnosis not present

## 2022-12-18 DIAGNOSIS — I5032 Chronic diastolic (congestive) heart failure: Secondary | ICD-10-CM | POA: Diagnosis not present

## 2022-12-18 DIAGNOSIS — E785 Hyperlipidemia, unspecified: Secondary | ICD-10-CM | POA: Diagnosis not present

## 2022-12-18 DIAGNOSIS — I130 Hypertensive heart and chronic kidney disease with heart failure and stage 1 through stage 4 chronic kidney disease, or unspecified chronic kidney disease: Secondary | ICD-10-CM | POA: Diagnosis not present

## 2022-12-18 DIAGNOSIS — R7303 Prediabetes: Secondary | ICD-10-CM | POA: Diagnosis not present

## 2022-12-18 DIAGNOSIS — K5792 Diverticulitis of intestine, part unspecified, without perforation or abscess without bleeding: Secondary | ICD-10-CM | POA: Diagnosis not present

## 2022-12-18 DIAGNOSIS — M459 Ankylosing spondylitis of unspecified sites in spine: Secondary | ICD-10-CM | POA: Diagnosis not present

## 2022-12-18 DIAGNOSIS — A411 Sepsis due to other specified staphylococcus: Secondary | ICD-10-CM | POA: Diagnosis not present

## 2022-12-18 DIAGNOSIS — J9611 Chronic respiratory failure with hypoxia: Secondary | ICD-10-CM | POA: Diagnosis not present

## 2022-12-18 DIAGNOSIS — N1831 Chronic kidney disease, stage 3a: Secondary | ICD-10-CM | POA: Diagnosis not present

## 2022-12-18 DIAGNOSIS — I2510 Atherosclerotic heart disease of native coronary artery without angina pectoris: Secondary | ICD-10-CM | POA: Diagnosis not present

## 2022-12-18 DIAGNOSIS — J439 Emphysema, unspecified: Secondary | ICD-10-CM | POA: Diagnosis not present

## 2022-12-19 ENCOUNTER — Ambulatory Visit: Payer: Self-pay

## 2022-12-19 NOTE — Patient Outreach (Signed)
  Care Coordination   12/19/2022 Name: [name] MRN: [id number] DOB: March 12, 1940   Care Coordination Outreach Attempts:  An unsuccessful telephone outreach was attempted for a scheduled appointment today.  Follow Up Plan:  Additional outreach attempts will be made to offer the patient care coordination information and services.   Encounter Outcome:  No Answer   Care Coordination Interventions:  No, not indicated    SIG Lysle Morales, BSW Social Worker [phone]

## 2022-12-20 DIAGNOSIS — I130 Hypertensive heart and chronic kidney disease with heart failure and stage 1 through stage 4 chronic kidney disease, or unspecified chronic kidney disease: Secondary | ICD-10-CM | POA: Diagnosis not present

## 2022-12-20 DIAGNOSIS — E611 Iron deficiency: Secondary | ICD-10-CM | POA: Diagnosis not present

## 2022-12-20 DIAGNOSIS — M47812 Spondylosis without myelopathy or radiculopathy, cervical region: Secondary | ICD-10-CM | POA: Diagnosis not present

## 2022-12-20 DIAGNOSIS — N179 Acute kidney failure, unspecified: Secondary | ICD-10-CM | POA: Diagnosis not present

## 2022-12-20 DIAGNOSIS — I2510 Atherosclerotic heart disease of native coronary artery without angina pectoris: Secondary | ICD-10-CM | POA: Diagnosis not present

## 2022-12-20 DIAGNOSIS — E785 Hyperlipidemia, unspecified: Secondary | ICD-10-CM | POA: Diagnosis not present

## 2022-12-20 DIAGNOSIS — K5792 Diverticulitis of intestine, part unspecified, without perforation or abscess without bleeding: Secondary | ICD-10-CM | POA: Diagnosis not present

## 2022-12-20 DIAGNOSIS — M1990 Unspecified osteoarthritis, unspecified site: Secondary | ICD-10-CM | POA: Diagnosis not present

## 2022-12-20 DIAGNOSIS — E559 Vitamin D deficiency, unspecified: Secondary | ICD-10-CM | POA: Diagnosis not present

## 2022-12-20 DIAGNOSIS — J4489 Other specified chronic obstructive pulmonary disease: Secondary | ICD-10-CM | POA: Diagnosis not present

## 2022-12-20 DIAGNOSIS — G4733 Obstructive sleep apnea (adult) (pediatric): Secondary | ICD-10-CM | POA: Diagnosis not present

## 2022-12-20 DIAGNOSIS — A411 Sepsis due to other specified staphylococcus: Secondary | ICD-10-CM | POA: Diagnosis not present

## 2022-12-20 DIAGNOSIS — R7401 Elevation of levels of liver transaminase levels: Secondary | ICD-10-CM | POA: Diagnosis not present

## 2022-12-20 DIAGNOSIS — M8580 Other specified disorders of bone density and structure, unspecified site: Secondary | ICD-10-CM | POA: Diagnosis not present

## 2022-12-20 DIAGNOSIS — E212 Other hyperparathyroidism: Secondary | ICD-10-CM | POA: Diagnosis not present

## 2022-12-20 DIAGNOSIS — M459 Ankylosing spondylitis of unspecified sites in spine: Secondary | ICD-10-CM | POA: Diagnosis not present

## 2022-12-20 DIAGNOSIS — N1831 Chronic kidney disease, stage 3a: Secondary | ICD-10-CM | POA: Diagnosis not present

## 2022-12-20 DIAGNOSIS — R7303 Prediabetes: Secondary | ICD-10-CM | POA: Diagnosis not present

## 2022-12-20 DIAGNOSIS — I5032 Chronic diastolic (congestive) heart failure: Secondary | ICD-10-CM | POA: Diagnosis not present

## 2022-12-20 DIAGNOSIS — J439 Emphysema, unspecified: Secondary | ICD-10-CM | POA: Diagnosis not present

## 2022-12-20 DIAGNOSIS — J9611 Chronic respiratory failure with hypoxia: Secondary | ICD-10-CM | POA: Diagnosis not present

## 2022-12-21 ENCOUNTER — Encounter: Payer: Self-pay | Admitting: Family Medicine

## 2022-12-21 ENCOUNTER — Ambulatory Visit (INDEPENDENT_AMBULATORY_CARE_PROVIDER_SITE_OTHER): Payer: Medicare Other | Admitting: Family Medicine

## 2022-12-21 VITALS — BP 123/62 | HR 78 | Temp 97.2°F | Ht 73.0 in | Wt 297.0 lb

## 2022-12-21 DIAGNOSIS — J449 Chronic obstructive pulmonary disease, unspecified: Secondary | ICD-10-CM | POA: Diagnosis not present

## 2022-12-21 DIAGNOSIS — D509 Iron deficiency anemia, unspecified: Secondary | ICD-10-CM

## 2022-12-21 DIAGNOSIS — Z23 Encounter for immunization: Secondary | ICD-10-CM | POA: Diagnosis not present

## 2022-12-21 DIAGNOSIS — E785 Hyperlipidemia, unspecified: Secondary | ICD-10-CM

## 2022-12-21 DIAGNOSIS — R0902 Hypoxemia: Secondary | ICD-10-CM | POA: Diagnosis not present

## 2022-12-21 DIAGNOSIS — N1831 Chronic kidney disease, stage 3a: Secondary | ICD-10-CM

## 2022-12-21 DIAGNOSIS — J441 Chronic obstructive pulmonary disease with (acute) exacerbation: Secondary | ICD-10-CM | POA: Diagnosis not present

## 2022-12-21 DIAGNOSIS — I10 Essential (primary) hypertension: Secondary | ICD-10-CM

## 2022-12-21 DIAGNOSIS — J9601 Acute respiratory failure with hypoxia: Secondary | ICD-10-CM | POA: Diagnosis not present

## 2022-12-21 MED ORDER — MUPIROCIN 2 % EX OINT: TOPICAL_OINTMENT | CUTANEOUS | 2 refills | Status: DC

## 2022-12-21 MED ORDER — ISOSORBIDE MONONITRATE ER 30 MG PO TB24: ORAL_TABLET | ORAL | 1 refills | Status: DC

## 2022-12-21 MED ORDER — HYDRALAZINE HCL 50 MG PO TABS: ORAL_TABLET | ORAL | 6 refills | Status: DC

## 2022-12-22 LAB — CBC WITH DIFFERENTIAL/PLATELET
Basophils Absolute: 0.1 10*3/uL (ref 0.0–0.2)
Basos: 1 %
EOS (ABSOLUTE): 0.3 10*3/uL (ref 0.0–0.4)
Eos: 2 %
Hematocrit: 37.6 % (ref 37.5–51.0)
Hemoglobin: 12.5 g/dL — ABNORMAL LOW (ref 13.0–17.7)
Immature Grans (Abs): 0.1 10*3/uL (ref 0.0–0.1)
Immature Granulocytes: 1 %
Lymphocytes Absolute: 3.9 10*3/uL — ABNORMAL HIGH (ref 0.7–3.1)
Lymphs: 30 %
MCH: 30.4 pg (ref 26.6–33.0)
MCHC: 33.2 g/dL (ref 31.5–35.7)
MCV: 92 fL (ref 79–97)
Monocytes Absolute: 1.4 10*3/uL — ABNORMAL HIGH (ref 0.1–0.9)
Monocytes: 11 %
Neutrophils Absolute: 7.2 10*3/uL — ABNORMAL HIGH (ref 1.4–7.0)
Neutrophils: 55 %
Platelets: 387 10*3/uL (ref 150–450)
RBC: 4.11 x10E6/uL — ABNORMAL LOW (ref 4.14–5.80)
RDW: 12.7 % (ref 11.6–15.4)
WBC: 12.9 10*3/uL — ABNORMAL HIGH (ref 3.4–10.8)

## 2022-12-22 LAB — BASIC METABOLIC PANEL
BUN/Creatinine Ratio: 18 (ref 10–24)
BUN: 30 mg/dL — ABNORMAL HIGH (ref 8–27)
CO2: 23 mmol/L (ref 20–29)
Calcium: 10.8 mg/dL — ABNORMAL HIGH (ref 8.6–10.2)
Chloride: 101 mmol/L (ref 96–106)
Creatinine, Ser: 1.64 mg/dL — ABNORMAL HIGH (ref 0.76–1.27)
Glucose: 91 mg/dL (ref 70–99)
Potassium: 4.7 mmol/L (ref 3.5–5.2)
Sodium: 140 mmol/L (ref 134–144)
eGFR: 41 mL/min/{1.73_m2} — ABNORMAL LOW (ref >59)

## 2022-12-24 ENCOUNTER — Encounter: Payer: Self-pay | Admitting: *Deleted

## 2022-12-24 ENCOUNTER — Ambulatory Visit: Payer: Self-pay | Admitting: *Deleted

## 2022-12-24 DIAGNOSIS — R208 Other disturbances of skin sensation: Secondary | ICD-10-CM | POA: Diagnosis not present

## 2022-12-24 DIAGNOSIS — Z85828 Personal history of other malignant neoplasm of skin: Secondary | ICD-10-CM | POA: Diagnosis not present

## 2022-12-24 DIAGNOSIS — D1801 Hemangioma of skin and subcutaneous tissue: Secondary | ICD-10-CM | POA: Diagnosis not present

## 2022-12-24 DIAGNOSIS — L820 Inflamed seborrheic keratosis: Secondary | ICD-10-CM | POA: Diagnosis not present

## 2022-12-24 DIAGNOSIS — L538 Other specified erythematous conditions: Secondary | ICD-10-CM | POA: Diagnosis not present

## 2022-12-24 DIAGNOSIS — L2989 Other pruritus: Secondary | ICD-10-CM | POA: Diagnosis not present

## 2022-12-24 DIAGNOSIS — L821 Other seborrheic keratosis: Secondary | ICD-10-CM | POA: Diagnosis not present

## 2022-12-24 DIAGNOSIS — L218 Other seborrheic dermatitis: Secondary | ICD-10-CM | POA: Diagnosis not present

## 2022-12-24 DIAGNOSIS — L814 Other melanin hyperpigmentation: Secondary | ICD-10-CM | POA: Diagnosis not present

## 2022-12-24 DIAGNOSIS — Z789 Other specified health status: Secondary | ICD-10-CM | POA: Diagnosis not present

## 2022-12-24 DIAGNOSIS — Z08 Encounter for follow-up examination after completed treatment for malignant neoplasm: Secondary | ICD-10-CM | POA: Diagnosis not present

## 2022-12-24 NOTE — Patient Outreach (Signed)
Care Coordination   Follow Up Visit Note   12/24/2022  Name: [name] MRN: [id number] DOB: 03/06/1940  [name] is a 82 y.o. year old male who sees Luking, [name], MD for primary care. I spoke with patient's daughter, [name] by phone today.  What matters to the patients health and wellness today?  Receive Assistance Obtaining In-Home Care Services.    Goals Addressed             This Visit's Progress    Receive Assistance Obtaining In-Home Care Services.   On track    Care Coordination Interventions:  Interventions Today    Flowsheet Row Most Recent Value  Chronic Disease   Chronic disease during today's visit Chronic Obstructive Pulmonary Disease (COPD), Congestive Heart Failure (CHF), Hypertension (HTN), Chronic Kidney Disease/End Stage Renal Disease (ESRD), Other  [Morbid Obesity, Major Depression, Single Episode, Moderate, Prediabetes, Daughter with Caregiver Burnout/Stress/Fatigue, Inability to Perform Activities of Daily Living Independently, Ulcerative Collitus, Memory Deficits]  General Interventions   General Interventions Discussed/Reviewed General Interventions Discussed, Durable Medical Equipment (DME), Communication with, Level of Care, General Interventions Reviewed, [employer], Labs  Labs Hgb A1c every 3 months, Kidney Function  Doctor Visits Discussed/Reviewed Doctor Visits Discussed, Doctor Visits Reviewed, Annual Wellness Visits, PCP, Database administrator (DME) Bed side commode, BP Cuff, Environmental consultant, Wheelchair, Tour manager, Other  [Hand-Held Chief Strategy Officer, Incontinence Pads & Depends]  Wheelchair Standard  PCP/Specialist Visits Compliance with follow-up visit  Communication with PCP/Specialists, RN  Level of Care Adult Daycare, Applications, Assisted Living, Skilled Nursing Facility, Teaching laboratory technician Medicaid, Personal Care Services, FL-2  Exercise Interventions   Exercise Discussed/Reviewed  Exercise Discussed, Assistive device use and maintanence, Weight Managment, Physical Activity, Exercise Reviewed  Physical Activity Discussed/Reviewed Physical Activity Discussed, Physical Activity Reviewed, Types of exercise, Home Exercise Program (HEP), PREP, Gym  Weight Management Weight loss  Education Interventions   Education Provided Provided Therapist, sports, Provided Web-based Education, Provided Education  Provided Verbal Education On Nutrition, Exercise, Mental Health/Coping with Illness, Air traffic controller, Development worker, community, [employer], When to see the Scientist, research (physical sciences) Medicaid, Personal Care Services, FL-2  Mental Health Interventions   Mental Health Discussed/Reviewed Mental Health Discussed, Substance Abuse, Grief and Loss, Depression, Anxiety, Crisis, Coping Strategies, Mental Health Reviewed, Suicide, Other  Nutrition Interventions   Nutrition Discussed/Reviewed Nutrition Discussed, Portion sizes, Decreasing sugar intake, Nutrition Reviewed, Increasing proteins, Carbohydrate meal planning, Decreasing fats, Adding fruits and vegetables, Decreasing salt, Fluid intake  Pharmacy Interventions   Pharmacy Dicussed/Reviewed Pharmacy Topics Discussed, Pharmacy Topics Reviewed, Medications and their functions, Medication Adherence, Affording Medications  Medication Adherence --  [Medication Compliance According to Daughter/24 Hour Caregiver]  Safety Interventions   Safety Discussed/Reviewed Safety Discussed, Safety Reviewed, Fall Risk, Home Safety  Home Safety Assistive Devices, Need for home safety assessment, Refer for home visit, Contact provider for referral to PT/OT, Refer for community resources, Contact home health agency  Advanced Directive Interventions   Advanced Directives Discussed/Reviewed Advanced Directives Discussed      Active Listening & Reflection Utilized.  Verbalization of Feelings Encouraged.  Emotional Support Provided. Problem Solving Interventions  Employed. Solution-Focused Strategies Implemented. Acceptance & Commitment Therapy Initiated. Cognitive Behavioral Therapy Performed. CSW Collaboration with Daughter, [name] to Confirm the Following Home Health Services in Place, through [facility] [phone]): ~ Skilled Nursing - Wound Care & Dressing Changes, 1 Time Per Week, for 4 Weeks & 2 PRN Visits. ~ Mady Haagensen Aide - Bathing & Dressing Assistance,  1 Time Per Week, for 8 Weeks. ~ Physical Therapy - Mobility, Strengthening, Conditioning, Etc. - 2 Times Per Week, for 8 Weeks. ~ Occupational Therapy - Safety, Education, Investment banker, operational, Etc. - 2 Times Per Week, for 8 Weeks. CSW Collaboration with Daughter, [name] to Confirm Receipt of Bariatric Bedside Commode for Home Use, through [facility] [phone]). CSW Collaboration with Daughter, [name] to Confirm Receipt of In-Home Oxygen Concentrator, Portable Oxygen Concentrator & [employer], through [employer] [phone]). CSW Collaboration with Daughter, [name] to Encourage Routine Engagement with Representative from Aging, Disability & Transit Services of [place] [phone]), to Check Status of Patient's Name on Waiting List to Receive Prepared Meal Delivery Services. CSW Collaboration with Daughter, [name] to Encourage Routine Engagement with Representative from Aging, Disability & Transit Services of [place] [phone]), to Check Status of Patient's Name on Waiting List to Receive [facility].  CSW Collaboration with Daughter, [name] to Encourage Routine Engagement with Representative from [employer] [phone]), to Check Status of Patient's Name on Waiting List to Receive Reduced Cost Wheelchair Ramp Installation.  CSW Collaboration with Daughter, [name] to Encourage Routine Engagement with Representative from [employer]: Ramp [employer], through  Department of Health & Human Services (# [phone]), to Check Status of Patient's Name on Waiting List to Receive Reduced Network engineer.  CSW Collaboration with Daughter, [name] to Encourage Engagement with Danford Bad, Licensed Clinical Social Worker with [facility] [phone]), if She Has Questions, Needs Assistance, or If Additional Social Work Needs Are Identified Between Now & Our Next Follow-Up Outreach Call, Scheduled on 01/14/2023 at 9:45 AM.        SDOH assessments and interventions completed:  Yes.  SDOH Interventions Today    Flowsheet Row Most Recent Value  SDOH Interventions   Food Insecurity Interventions Intervention Not Indicated  Housing Interventions Intervention Not Indicated  Transportation Interventions Intervention Not Indicated, Patient Resources (Friends/Family), Payor Benefit  Utilities Interventions Intervention Not Indicated  Alcohol Usage Interventions Intervention Not Indicated (Score <7)  Depression Interventions/Treatment  Medication, Counseling, Currently on Treatment  Financial Strain Interventions Intervention Not Indicated  Physical Activity Interventions Patient Declined  Stress Interventions Intervention Not Indicated  Social Connections Interventions Patient Declined  Health Literacy Interventions Intervention Not Indicated     Care Coordination Interventions:  Yes, provided.   Follow up plan: Follow up call scheduled for 01/14/2023 at 9:45 am.  Encounter Outcome:  Patient Visit Completed.   Danford Bad, BSW, MSW, Printmaker Social Work Case Set designer Health  [facility], Population Health Direct Dial: [phone]  Fax: [phone] Email: [email or web address]: [email or web address]

## 2022-12-24 NOTE — Patient Instructions (Signed)
Visit Information  Thank you for taking time to visit with me today. Please don't hesitate to contact me if I can be of assistance to you.   Following are the goals we discussed today:   Goals Addressed             This Visit's Progress    Receive Assistance Obtaining In-Home Care Services.   On track    Care Coordination Interventions:  Interventions Today    Flowsheet Row Most Recent Value  Chronic Disease   Chronic disease during today's visit Chronic Obstructive Pulmonary Disease (COPD), Congestive Heart Failure (CHF), Hypertension (HTN), Chronic Kidney Disease/End Stage Renal Disease (ESRD), Other  [Morbid Obesity, Major Depression, Single Episode, Moderate, Prediabetes, Daughter with Caregiver Burnout/Stress/Fatigue, Inability to Perform Activities of Daily Living Independently, Ulcerative Collitus, Memory Deficits]  General Interventions   General Interventions Discussed/Reviewed General Interventions Discussed, Durable Medical Equipment (DME), Communication with, Level of Care, General Interventions Reviewed, [employer], Labs  Labs Hgb A1c every 3 months, Kidney Function  Doctor Visits Discussed/Reviewed Doctor Visits Discussed, Doctor Visits Reviewed, Annual Wellness Visits, PCP, Database administrator (DME) Bed side commode, BP Cuff, Environmental consultant, Wheelchair, Tour manager, Other  [Hand-Held Chief Strategy Officer, Incontinence Pads & Depends]  Wheelchair Standard  PCP/Specialist Visits Compliance with follow-up visit  Communication with PCP/Specialists, RN  Level of Care Adult Daycare, Applications, Assisted Living, Skilled Nursing Facility, Teaching laboratory technician Medicaid, Personal Care Services, [place]-2  Exercise Interventions   Exercise Discussed/Reviewed Exercise Discussed, Assistive device use and maintanence, Weight Managment, Physical Activity, Exercise Reviewed  Physical Activity Discussed/Reviewed Physical Activity Discussed, Physical Activity  Reviewed, Types of exercise, Home Exercise Program (HEP), PREP, Gym  Weight Management Weight loss  Education Interventions   Education Provided Provided Therapist, sports, Provided Web-based Education, Provided Education  Provided Verbal Education On Nutrition, Exercise, Mental Health/Coping with Illness, Air traffic controller, Development worker, community, [employer], When to see the Scientist, research (physical sciences) Medicaid, Personal Care Services, [place]-2  Mental Health Interventions   Mental Health Discussed/Reviewed Mental Health Discussed, Substance Abuse, Grief and Loss, Depression, Anxiety, Crisis, Coping Strategies, Mental Health Reviewed, Suicide, Other  Nutrition Interventions   Nutrition Discussed/Reviewed Nutrition Discussed, Portion sizes, Decreasing sugar intake, Nutrition Reviewed, Increasing proteins, Carbohydrate meal planning, Decreasing fats, Adding fruits and vegetables, Decreasing salt, Fluid intake  Pharmacy Interventions   Pharmacy Dicussed/Reviewed Pharmacy Topics Discussed, Pharmacy Topics Reviewed, Medications and their functions, Medication Adherence, Affording Medications  Medication Adherence --  [Medication Compliance According to Daughter/24 Hour Caregiver]  Safety Interventions   Safety Discussed/Reviewed Safety Discussed, Safety Reviewed, Fall Risk, Home Safety  Home Safety Assistive Devices, Need for home safety assessment, Refer for home visit, Contact provider for referral to PT/OT, Refer for community resources, Contact home health agency  Advanced Directive Interventions   Advanced Directives Discussed/Reviewed Advanced Directives Discussed      Active Listening & Reflection Utilized.  Verbalization of Feelings Encouraged.  Emotional Support Provided. Problem Solving Interventions Employed. Solution-Focused Strategies Implemented. Acceptance & Commitment Therapy Initiated. Cognitive Behavioral Therapy Performed. CSW Collaboration with Daughter, [name] to Confirm the  Following Home Health Services in Place, through [facility] [phone]): ~ Skilled Nursing - Wound Care & Dressing Changes, 1 Time Per Week, for 4 Weeks & 2 PRN Visits. ~ Mady Haagensen Aide - Bathing & Dressing Assistance, 1 Time Per Week, for 8 Weeks. ~ Physical Therapy - Mobility, Strengthening, Conditioning, Etc. - 2 Times Per Week, for 8 Weeks. ~ Occupational Therapy - Safety, Education, Investment banker, operational, Etc. -  2 Times Per Week, for 8 Weeks. CSW Collaboration with Daughter, [name] to Confirm Receipt of Bariatric Bedside Commode for Home Use, through [employer] [phone]). CSW Collaboration with Daughter, [name] to Confirm Receipt of In-Home Oxygen Concentrator, Portable Oxygen Concentrator & [employer], through [employer] [phone]). CSW Collaboration with Daughter, [name] to Encourage Routine Engagement with Representative from Aging, Disability & Transit Services of [place] [phone]), to Check Status of Patient's Name on Waiting List to Receive Prepared Meal Delivery Services. CSW Collaboration with Daughter, [name] to Encourage Routine Engagement with Representative from Aging, Disability & Transit Services of [place] [phone]), to Check Status of Patient's Name on Waiting List to Receive [facility].  CSW Collaboration with Daughter, [name] to Encourage Routine Engagement with Representative from [employer] [phone]), to Check Status of Patient's Name on Waiting List to Receive Reduced Cost Wheelchair Ramp Installation.  CSW Collaboration with Daughter, [name] to Encourage Routine Engagement with Representative from [place] [employer]: Ramp [employer], through [place] [facility] (# [phone]), to Check Status of Patient's Name on Waiting List to Receive Reduced Network engineer.  CSW Collaboration  with Daughter, [name] to Encourage Engagement with Danford Bad, Licensed Clinical Social Worker with [facility] [phone]), if She Has Questions, Needs Assistance, or If Additional Social Work Needs Are Identified Between Now & Our Next Follow-Up Outreach Call, Scheduled on 01/14/2023 at 9:45 AM.      Our next appointment is by telephone on  01/14/2023 at 9:45 am.  Please call the care guide team at [phone] if you need to cancel or reschedule your appointment.   If you are experiencing a Mental Health or Behavioral Health Crisis or need someone to talk to, please call the Suicide and Crisis Lifeline: 988 call the [place] National Suicide Prevention Lifeline: (236) 514-1406 or TTY: (234)380-8263 TTY (863) 158-9083) to talk to a trained counselor call 1-800-273-TALK (toll free, 24 hour hotline) go to [facility] [address], [place] [phone]) call the [facility] Crisis Line: [phone] call 911  Patient verbalizes understanding of instructions and care plan provided today and agrees to view in MyChart. Active MyChart status and patient understanding of how to access instructions and care plan via MyChart confirmed with patient.     Telephone follow up appointment with care management team member scheduled for:   01/14/2023 at 9:45 am.  Danford Bad, BSW, MSW, LCSW  Embedded Practice Social Work Case Manager  [facility], Population Health Direct Dial: [phone]  Fax: [phone] Email: [email or web address]

## 2022-12-25 NOTE — Progress Notes (Signed)
   Subjective:    Patient ID: [name], male    DOB: 08/07/1940, 82 y.o.   MRN: [id number]  Discussed the use of AI scribe software for clinical note transcription with the patient, who gave verbal consent to proceed.  History of Present Illness   The patient, [name], with a history of hypertension and kidney disease, has been feeling weak since his recent hospital discharge. He reports feeling alright after waking up and taking his medication, but experiences increased weakness when attempting to engage in any activities. Despite this, he is able to move from room to room independently.  The patient's appetite has significantly decreased, with breakfast being his only meal of the day. This has resulted in a weight loss from 315 to 297 pounds. He is also experiencing issues with his bowel movements, including the presence of butter in his stool, despite only consuming a few bites of food.  The patient's blood pressure has been consistently low, which may be contributing to his feelings of fatigue. He is currently on a regimen of hydralazine, torsemide, isosorbide mononitrate, carvedilol, and a daily vitamin. The patient's caregiver has been managing his medication and has adjusted the dosage of hydralazine due to concerns about the patient's low blood pressure.  The patient also has multiple skin lesions, which are being monitored for potential malignancy. He has a history of seborrheic dermatitis and seborrheic keratosis.  In addition to these health concerns, the patient's living situation is being modified for safety. A ramp is being installed at his residence to prevent falls, and a bariatric toilet is being considered due to the patient's increasing weakness.         Review of Systems     Objective:    Physical Exam   MEASUREMENTS: WT- 297 pounds EXTREMITIES: No edema on feet     General-in no acute distress Eyes-no discharge Lungs-respiratory rate normal, CTA CV-no  murmurs,RRR Extremities skin warm dry no edema Neuro grossly normal Behavior normal, alert       Assessment & Plan:  Assessment and Plan    Generalized Weakness Daily weakness, exacerbated by activity. No other associated symptoms reported. -Continue current medications and monitor symptoms.  Unintentional Weight Loss Significant weight loss from 315 to 297 lbs. Decreased appetite and reduced food intake reported. -Encourage small, frequent meals to increase caloric intake.  Hypotension Low blood pressure readings at home, despite reduction in hydralazine dosage. -Reduce hydralazine to 25mg  twice daily. -Check blood pressure regularly at home and report any significant changes. decrease Isosorbide mononitrate to 2 tablets  Skin Lesions Multiple skin lesions reported, with some improvement noted with topical treatment. -Continue current topical treatment. -Dermatology appointment scheduled for further evaluation.  General Health Maintenance -Administer influenza vaccine today. -Schedule follow-up appointment in approximately 3 weeks.     1. Essential hypertension Blood pressure fair control - CBC with Differential - Basic Metabolic Panel  2. Stage 3a chronic kidney disease (CKD) ([name]) Check lab work continue current measures - Basic Metabolic Panel  3. Hyperlipidemia, unspecified hyperlipidemia type Healthy diet  4. Iron deficiency anemia, unspecified iron deficiency anemia type Check lab work - CBC with Differential  5. Immunization due Flu shot today - Flu Vaccine Trivalent High Dose (Fluad)  6. Chronic obstructive pulmonary disease, unspecified COPD type ([name]) Patient did have hypoxemia in the hospital.  Would benefit from ongoing oxygen  7. Hypoxia See above  Follow-up within 4 weeks

## 2022-12-27 DIAGNOSIS — J9611 Chronic respiratory failure with hypoxia: Secondary | ICD-10-CM | POA: Diagnosis not present

## 2022-12-27 DIAGNOSIS — J439 Emphysema, unspecified: Secondary | ICD-10-CM | POA: Diagnosis not present

## 2022-12-27 DIAGNOSIS — I5032 Chronic diastolic (congestive) heart failure: Secondary | ICD-10-CM | POA: Diagnosis not present

## 2022-12-27 DIAGNOSIS — F321 Major depressive disorder, single episode, moderate: Secondary | ICD-10-CM

## 2022-12-27 DIAGNOSIS — I2510 Atherosclerotic heart disease of native coronary artery without angina pectoris: Secondary | ICD-10-CM | POA: Diagnosis not present

## 2022-12-27 DIAGNOSIS — F0393 Unspecified dementia, unspecified severity, with mood disturbance: Secondary | ICD-10-CM

## 2022-12-27 DIAGNOSIS — K5792 Diverticulitis of intestine, part unspecified, without perforation or abscess without bleeding: Secondary | ICD-10-CM | POA: Diagnosis not present

## 2022-12-27 DIAGNOSIS — N179 Acute kidney failure, unspecified: Secondary | ICD-10-CM | POA: Diagnosis not present

## 2022-12-27 DIAGNOSIS — N1831 Chronic kidney disease, stage 3a: Secondary | ICD-10-CM | POA: Diagnosis not present

## 2022-12-27 DIAGNOSIS — A411 Sepsis due to other specified staphylococcus: Secondary | ICD-10-CM | POA: Diagnosis not present

## 2022-12-27 DIAGNOSIS — I130 Hypertensive heart and chronic kidney disease with heart failure and stage 1 through stage 4 chronic kidney disease, or unspecified chronic kidney disease: Secondary | ICD-10-CM | POA: Diagnosis not present

## 2022-12-27 DIAGNOSIS — J4489 Other specified chronic obstructive pulmonary disease: Secondary | ICD-10-CM | POA: Diagnosis not present

## 2022-12-31 ENCOUNTER — Other Ambulatory Visit: Payer: Self-pay | Admitting: Family Medicine

## 2022-12-31 MED ORDER — HYDRALAZINE HCL 25 MG PO TABS: ORAL_TABLET | ORAL | 5 refills | Status: DC

## 2023-01-02 ENCOUNTER — Ambulatory Visit: Payer: Self-pay

## 2023-01-02 DIAGNOSIS — E611 Iron deficiency: Secondary | ICD-10-CM | POA: Diagnosis not present

## 2023-01-02 DIAGNOSIS — M47812 Spondylosis without myelopathy or radiculopathy, cervical region: Secondary | ICD-10-CM | POA: Diagnosis not present

## 2023-01-02 DIAGNOSIS — A411 Sepsis due to other specified staphylococcus: Secondary | ICD-10-CM | POA: Diagnosis not present

## 2023-01-02 DIAGNOSIS — M459 Ankylosing spondylitis of unspecified sites in spine: Secondary | ICD-10-CM | POA: Diagnosis not present

## 2023-01-02 DIAGNOSIS — J439 Emphysema, unspecified: Secondary | ICD-10-CM | POA: Diagnosis not present

## 2023-01-02 DIAGNOSIS — R7401 Elevation of levels of liver transaminase levels: Secondary | ICD-10-CM | POA: Diagnosis not present

## 2023-01-02 DIAGNOSIS — N1831 Chronic kidney disease, stage 3a: Secondary | ICD-10-CM | POA: Diagnosis not present

## 2023-01-02 DIAGNOSIS — R7303 Prediabetes: Secondary | ICD-10-CM | POA: Diagnosis not present

## 2023-01-02 DIAGNOSIS — E785 Hyperlipidemia, unspecified: Secondary | ICD-10-CM | POA: Diagnosis not present

## 2023-01-02 DIAGNOSIS — J4489 Other specified chronic obstructive pulmonary disease: Secondary | ICD-10-CM | POA: Diagnosis not present

## 2023-01-02 DIAGNOSIS — I2510 Atherosclerotic heart disease of native coronary artery without angina pectoris: Secondary | ICD-10-CM | POA: Diagnosis not present

## 2023-01-02 DIAGNOSIS — G4733 Obstructive sleep apnea (adult) (pediatric): Secondary | ICD-10-CM | POA: Diagnosis not present

## 2023-01-02 DIAGNOSIS — E212 Other hyperparathyroidism: Secondary | ICD-10-CM | POA: Diagnosis not present

## 2023-01-02 DIAGNOSIS — E559 Vitamin D deficiency, unspecified: Secondary | ICD-10-CM | POA: Diagnosis not present

## 2023-01-02 DIAGNOSIS — I5032 Chronic diastolic (congestive) heart failure: Secondary | ICD-10-CM | POA: Diagnosis not present

## 2023-01-02 DIAGNOSIS — J9611 Chronic respiratory failure with hypoxia: Secondary | ICD-10-CM | POA: Diagnosis not present

## 2023-01-02 DIAGNOSIS — M1990 Unspecified osteoarthritis, unspecified site: Secondary | ICD-10-CM | POA: Diagnosis not present

## 2023-01-02 DIAGNOSIS — I130 Hypertensive heart and chronic kidney disease with heart failure and stage 1 through stage 4 chronic kidney disease, or unspecified chronic kidney disease: Secondary | ICD-10-CM | POA: Diagnosis not present

## 2023-01-02 DIAGNOSIS — N179 Acute kidney failure, unspecified: Secondary | ICD-10-CM | POA: Diagnosis not present

## 2023-01-02 DIAGNOSIS — K5792 Diverticulitis of intestine, part unspecified, without perforation or abscess without bleeding: Secondary | ICD-10-CM | POA: Diagnosis not present

## 2023-01-02 DIAGNOSIS — M8580 Other specified disorders of bone density and structure, unspecified site: Secondary | ICD-10-CM | POA: Diagnosis not present

## 2023-01-02 NOTE — Patient Outreach (Signed)
  Care Coordination   01/02/2023 Name: [name] MRN: [id number] DOB: [birth year]   Care Coordination Outreach Attempts:  An unsuccessful telephone outreach was attempted for a scheduled appointment today.  Follow Up Plan:  Additional outreach attempts will be made to offer the patient care coordination information and services.   Encounter Outcome:  No Answer   Care Coordination Interventions:  No, not indicated    Lysle Morales, BSW Social Worker [phone]

## 2023-01-07 DIAGNOSIS — N1831 Chronic kidney disease, stage 3a: Secondary | ICD-10-CM | POA: Diagnosis not present

## 2023-01-07 DIAGNOSIS — E212 Other hyperparathyroidism: Secondary | ICD-10-CM | POA: Diagnosis not present

## 2023-01-07 DIAGNOSIS — R7303 Prediabetes: Secondary | ICD-10-CM | POA: Diagnosis not present

## 2023-01-07 DIAGNOSIS — E785 Hyperlipidemia, unspecified: Secondary | ICD-10-CM | POA: Diagnosis not present

## 2023-01-07 DIAGNOSIS — J4489 Other specified chronic obstructive pulmonary disease: Secondary | ICD-10-CM | POA: Diagnosis not present

## 2023-01-07 DIAGNOSIS — M459 Ankylosing spondylitis of unspecified sites in spine: Secondary | ICD-10-CM | POA: Diagnosis not present

## 2023-01-07 DIAGNOSIS — J439 Emphysema, unspecified: Secondary | ICD-10-CM | POA: Diagnosis not present

## 2023-01-07 DIAGNOSIS — I5032 Chronic diastolic (congestive) heart failure: Secondary | ICD-10-CM | POA: Diagnosis not present

## 2023-01-07 DIAGNOSIS — R7401 Elevation of levels of liver transaminase levels: Secondary | ICD-10-CM | POA: Diagnosis not present

## 2023-01-07 DIAGNOSIS — E611 Iron deficiency: Secondary | ICD-10-CM | POA: Diagnosis not present

## 2023-01-07 DIAGNOSIS — M8580 Other specified disorders of bone density and structure, unspecified site: Secondary | ICD-10-CM | POA: Diagnosis not present

## 2023-01-07 DIAGNOSIS — N179 Acute kidney failure, unspecified: Secondary | ICD-10-CM | POA: Diagnosis not present

## 2023-01-07 DIAGNOSIS — I130 Hypertensive heart and chronic kidney disease with heart failure and stage 1 through stage 4 chronic kidney disease, or unspecified chronic kidney disease: Secondary | ICD-10-CM | POA: Diagnosis not present

## 2023-01-07 DIAGNOSIS — M47812 Spondylosis without myelopathy or radiculopathy, cervical region: Secondary | ICD-10-CM | POA: Diagnosis not present

## 2023-01-07 DIAGNOSIS — E559 Vitamin D deficiency, unspecified: Secondary | ICD-10-CM | POA: Diagnosis not present

## 2023-01-07 DIAGNOSIS — A411 Sepsis due to other specified staphylococcus: Secondary | ICD-10-CM | POA: Diagnosis not present

## 2023-01-07 DIAGNOSIS — M1990 Unspecified osteoarthritis, unspecified site: Secondary | ICD-10-CM | POA: Diagnosis not present

## 2023-01-07 DIAGNOSIS — G4733 Obstructive sleep apnea (adult) (pediatric): Secondary | ICD-10-CM | POA: Diagnosis not present

## 2023-01-07 DIAGNOSIS — J9611 Chronic respiratory failure with hypoxia: Secondary | ICD-10-CM | POA: Diagnosis not present

## 2023-01-07 DIAGNOSIS — I2510 Atherosclerotic heart disease of native coronary artery without angina pectoris: Secondary | ICD-10-CM | POA: Diagnosis not present

## 2023-01-07 DIAGNOSIS — K5792 Diverticulitis of intestine, part unspecified, without perforation or abscess without bleeding: Secondary | ICD-10-CM | POA: Diagnosis not present

## 2023-01-10 ENCOUNTER — Ambulatory Visit: Payer: Medicare Other | Admitting: Podiatry

## 2023-01-11 ENCOUNTER — Ambulatory Visit: Payer: Medicare Other | Admitting: Family Medicine

## 2023-01-14 ENCOUNTER — Ambulatory Visit: Payer: Self-pay | Admitting: *Deleted

## 2023-01-14 DIAGNOSIS — E785 Hyperlipidemia, unspecified: Secondary | ICD-10-CM | POA: Diagnosis not present

## 2023-01-14 DIAGNOSIS — A411 Sepsis due to other specified staphylococcus: Secondary | ICD-10-CM | POA: Diagnosis not present

## 2023-01-14 DIAGNOSIS — J9611 Chronic respiratory failure with hypoxia: Secondary | ICD-10-CM | POA: Diagnosis not present

## 2023-01-14 DIAGNOSIS — J4489 Other specified chronic obstructive pulmonary disease: Secondary | ICD-10-CM | POA: Diagnosis not present

## 2023-01-14 DIAGNOSIS — N179 Acute kidney failure, unspecified: Secondary | ICD-10-CM | POA: Diagnosis not present

## 2023-01-14 DIAGNOSIS — M47812 Spondylosis without myelopathy or radiculopathy, cervical region: Secondary | ICD-10-CM | POA: Diagnosis not present

## 2023-01-14 DIAGNOSIS — J439 Emphysema, unspecified: Secondary | ICD-10-CM | POA: Diagnosis not present

## 2023-01-14 DIAGNOSIS — M459 Ankylosing spondylitis of unspecified sites in spine: Secondary | ICD-10-CM | POA: Diagnosis not present

## 2023-01-14 DIAGNOSIS — I2510 Atherosclerotic heart disease of native coronary artery without angina pectoris: Secondary | ICD-10-CM | POA: Diagnosis not present

## 2023-01-14 DIAGNOSIS — R7401 Elevation of levels of liver transaminase levels: Secondary | ICD-10-CM | POA: Diagnosis not present

## 2023-01-14 DIAGNOSIS — E559 Vitamin D deficiency, unspecified: Secondary | ICD-10-CM | POA: Diagnosis not present

## 2023-01-14 DIAGNOSIS — E212 Other hyperparathyroidism: Secondary | ICD-10-CM | POA: Diagnosis not present

## 2023-01-14 DIAGNOSIS — K5792 Diverticulitis of intestine, part unspecified, without perforation or abscess without bleeding: Secondary | ICD-10-CM | POA: Diagnosis not present

## 2023-01-14 DIAGNOSIS — G4733 Obstructive sleep apnea (adult) (pediatric): Secondary | ICD-10-CM | POA: Diagnosis not present

## 2023-01-14 DIAGNOSIS — R7303 Prediabetes: Secondary | ICD-10-CM | POA: Diagnosis not present

## 2023-01-14 DIAGNOSIS — I5032 Chronic diastolic (congestive) heart failure: Secondary | ICD-10-CM | POA: Diagnosis not present

## 2023-01-14 DIAGNOSIS — M8580 Other specified disorders of bone density and structure, unspecified site: Secondary | ICD-10-CM | POA: Diagnosis not present

## 2023-01-14 DIAGNOSIS — M1990 Unspecified osteoarthritis, unspecified site: Secondary | ICD-10-CM | POA: Diagnosis not present

## 2023-01-14 DIAGNOSIS — E611 Iron deficiency: Secondary | ICD-10-CM | POA: Diagnosis not present

## 2023-01-14 DIAGNOSIS — I130 Hypertensive heart and chronic kidney disease with heart failure and stage 1 through stage 4 chronic kidney disease, or unspecified chronic kidney disease: Secondary | ICD-10-CM | POA: Diagnosis not present

## 2023-01-14 DIAGNOSIS — N1831 Chronic kidney disease, stage 3a: Secondary | ICD-10-CM | POA: Diagnosis not present

## 2023-01-14 NOTE — Patient Instructions (Signed)
Visit Information  Thank you for taking time to visit with me today. Please don't hesitate to contact me if I can be of assistance to you.   Following are the goals we discussed today:   Goals Addressed             This Visit's Progress    Receive Assistance Obtaining In-Home Care Services.   On track    Care Coordination Interventions:  Interventions Today    Flowsheet Row Most Recent Value  Chronic Disease   Chronic disease during today's visit Chronic Obstructive Pulmonary Disease (COPD), Congestive Heart Failure (CHF), Hypertension (HTN), Chronic Kidney Disease/End Stage Renal Disease (ESRD)  [Morbid Obesity, Major Depression, Single Episode, Moderate, Prediabetes, Daughter with Caregiver Burnout/Stress/Fatigue, Inability to Perform Activities of Daily Living Independently, Ulcerative Collitus, Memory Deficits]  General Interventions   General Interventions Discussed/Reviewed General Interventions Discussed, General Interventions Reviewed, Durable Medical Equipment (DME), Communication with, Doctor Visits, Community Resources, Level of Care  [Communication with Care Team Members.]  Doctor Visits Discussed/Reviewed Doctor Visits Discussed, Doctor Visits Reviewed, Annual Wellness Visits, PCP, Specialist  [Encouraged Routine Engagement.]  Horticulturist, commercial (DME) Bed side commode, BP Cuff, Tour manager, Environmental consultant, Wheelchair, Other  [Hand-Held [employer], Incontinence Pads, Depends & Wheelchair Ramp.]  Wheelchair Standard  PCP/Specialist Visits Compliance with follow-up visit, Contact provider for referral to  [employer] Routine Engagement.]  Contacted provider for referral to PCP, Specialist, Dentist, Ophthalmologist  [Encouraged Routine Engagement.]  Communication with PCP/Specialists, Charity fundraiser, Pharmacists, Social Work  [employer] Routine Engagement.]  Level of Care Adult Daycare, Air traffic controller, Assisted Living, Skilled Nursing Facility  [Confirmed Disinterest in Enrollment in Adult  Day Care Program or Assistance with Pursuing Higher Level of Care Placement Options (I.e Assisted Living or Skilled Nursing Facility).]  Applications Medicaid, Personal Care Services  [Confirmed Disinterest in Receiving Assistance Applying for Medicaid or Personal Care Services.]  Education Interventions   Education Provided Provided Education  Provided Verbal Education On When to see the doctor, Mental Health/Coping with Illness, Programmer, applications, Air traffic controller, Medication  [Encourged Review & Consideration.]  Ship broker, Personal Care Services  [Confirmed Disinterest in Receiving Assistance Applying for Medicaid or Personal Care Services.]  Mental Health Interventions   Mental Health Discussed/Reviewed Mental Health Discussed, Grief and Loss, Substance Abuse, Mental Health Reviewed, Suicide, Other, Coping Strategies, Crisis, Anxiety, Depression  [Assessed Mental Health & Cognitive Status of Both Patient & Caregiver.]  Pharmacy Interventions   Pharmacy Dicussed/Reviewed Pharmacy Topics Discussed, Medication Adherence, Affording Medications, Pharmacy Topics Reviewed, Medications and their functions  [Confirmed Medication Compliance.]  Medication Adherence --  [Confirmed Ability to Afford Prescription Medications.]  Safety Interventions   Safety Discussed/Reviewed Safety Discussed, Safety Reviewed, Fall Risk, Home Safety  [Encouraged Consideration of Home Safety Evaluation.]  Home Safety Assistive Devices, Contact provider for referral to PT/OT, Refer for community resources, Contact home health agency, Refer for home visit, Need for home safety assessment  [Encouraged Routine Use of Assistive Devices. Home Health Physical Therapy Assessment Initiated on 01/14/2023.]        Active Listening & Reflection Utilized. Verbalization of Feelings Encouraged.  Emotional Support Provided. Acceptance & Commitment Therapy Implemented. Cognitive Behavioral Therapy Initiated. CSW Collaboration  with Daughter, [name] to Assist with Rescheduling Follow-Up Appointment for Patient with Dr. Lilyan Punt, Primary Care Provider with [facility] [phone]# [phone]), Via 3-Way Call with Scheduler, Scheduled on 01/18/2023 at 8:20 AM. CSW Collaboration with Daughter, [name] to [facility] Physical Therapy Assessment, through [facility] [phone]), Scheduled  on 01/14/2023. CSW Collaboration with Daughter, [name] to [employer] of Wheelchair Ramp for [employer]. CSW Collaboration with Daughter, [name] to Encourage Routine Engagement with Representative from Aging, Disability & Transit Services of [place] [phone]), to Check Status of Patient's Name on Waiting List to Receive Prepared Meal Delivery Services. CSW Collaboration with Daughter, [name] to Encourage Routine Engagement with Representative from Aging, Disability & Transit Services of [place] [phone]), to Check Status of Patient's Name on Waiting List to Receive [facility].  CSW Collaboration with Daughter, [name] to Encourage Routine Engagement with Danford Bad, Licensed Clinical Social Worker with [facility] [phone]), if She Has Questions, Needs Assistance, or If Additional Social Work Needs Are Identified Between Now & Our Next Follow-Up Outreach Call, Scheduled on 02/05/2023 at 10:45 AM.      Our next appointment is by telephone on 02/05/2023 at 10:45 am.  Please call the care guide team at [phone] if you need to cancel or reschedule your appointment.   If you are experiencing a Mental Health or Behavioral Health Crisis or need someone to talk to, please call the Suicide and Crisis Lifeline: 988 call the [place] National Suicide Prevention Lifeline: 769 536 3425 or TTY: 331-796-5506 TTY 516-600-9759) to talk to a trained counselor call 1-800-273-TALK (toll free,  24 hour hotline) go to [facility] [address], [place] [phone]) call the [facility] Crisis Line: [phone] call 911  Patient verbalizes understanding of instructions and care plan provided today and agrees to view in MyChart. Active MyChart status and patient understanding of how to access instructions and care plan via MyChart confirmed with patient.     Telephone follow up appointment with care management team member scheduled for:  02/05/2023 at 10:45 am.  Danford Bad, BSW, MSW, LCSW  Embedded Practice Social Work Case Manager  [facility], Population Health Direct Dial: [phone]  Fax: [phone] Email: [email or web address]: [email or web address]

## 2023-01-14 NOTE — Patient Outreach (Signed)
Care Coordination   Follow Up Visit Note   01/14/2023  Name: [name] MRN: [id number] DOB: 1940-02-23  [name] is a 82 y.o. year old male who sees Luking, [name], MD for primary care. I spoke with patient's daughter, [name] by phone today.  What matters to the patients health and wellness today?  Receive Assistance Obtaining In-Home Care Services.    Goals Addressed             This Visit's Progress    Receive Assistance Obtaining In-Home Care Services.   On track    Care Coordination Interventions:  Interventions Today    Flowsheet Row Most Recent Value  Chronic Disease   Chronic disease during today's visit Chronic Obstructive Pulmonary Disease (COPD), Congestive Heart Failure (CHF), Hypertension (HTN), Chronic Kidney Disease/End Stage Renal Disease (ESRD)  [Morbid Obesity, Major Depression, Single Episode, Moderate, Prediabetes, Daughter with Caregiver Burnout/Stress/Fatigue, Inability to Perform Activities of Daily Living Independently, Ulcerative Collitus, Memory Deficits]  General Interventions   General Interventions Discussed/Reviewed General Interventions Discussed, General Interventions Reviewed, Durable Medical Equipment (DME), Communication with, Doctor Visits, Community Resources, Level of Care  [Communication with Care Team Members.]  Doctor Visits Discussed/Reviewed Doctor Visits Discussed, Doctor Visits Reviewed, Annual Wellness Visits, PCP, Specialist  [Encouraged Routine Engagement.]  Horticulturist, commercial (DME) Bed side commode, BP Cuff, Tour manager, Environmental consultant, Wheelchair, Other  [Hand-Held [employer], Incontinence Pads, Depends & Wheelchair Ramp.]  Wheelchair Standard  PCP/Specialist Visits Compliance with follow-up visit, Contact provider for referral to  [employer] Routine Engagement.]  Contacted provider for referral to PCP, Specialist, Dentist, Ophthalmologist  [Encouraged Routine Engagement.]  Communication with  PCP/Specialists, Charity fundraiser, Pharmacists, Social Work  [employer] Routine Engagement.]  Level of Care Adult Daycare, Air traffic controller, Assisted Living, Skilled Nursing Facility  [Confirmed Disinterest in Enrollment in Adult Day Care Program or Assistance with Pursuing Higher Level of Care Placement Options (I.e Assisted Living or Skilled Nursing Facility).]  Applications Medicaid, Personal Care Services  [Confirmed Disinterest in Receiving Assistance Applying for Medicaid or Personal Care Services.]  Education Interventions   Education Provided Provided Education  Provided Verbal Education On When to see the doctor, Mental Health/Coping with Illness, Programmer, applications, Air traffic controller, Medication  [Encourged Review & Consideration.]  Ship broker, Personal Care Services  [Confirmed Disinterest in Receiving Assistance Applying for Medicaid or Personal Care Services.]  Mental Health Interventions   Mental Health Discussed/Reviewed Mental Health Discussed, Grief and Loss, Substance Abuse, Mental Health Reviewed, Suicide, Other, Coping Strategies, Crisis, Anxiety, Depression  [Assessed Mental Health & Cognitive Status of Both Patient & Caregiver.]  Pharmacy Interventions   Pharmacy Dicussed/Reviewed Pharmacy Topics Discussed, Medication Adherence, Affording Medications, Pharmacy Topics Reviewed, Medications and their functions  [Confirmed Medication Compliance.]  Medication Adherence --  [Confirmed Ability to Afford Prescription Medications.]  Safety Interventions   Safety Discussed/Reviewed Safety Discussed, Safety Reviewed, Fall Risk, Home Safety  [Encouraged Consideration of Home Safety Evaluation.]  Home Safety Assistive Devices, Contact provider for referral to PT/OT, Refer for community resources, Contact home health agency, Refer for home visit, Need for home safety assessment  [Encouraged Routine Use of Assistive Devices. Home Health Physical Therapy Assessment Initiated on 01/14/2023.]         Active Listening & Reflection Utilized. Verbalization of Feelings Encouraged.  Emotional Support Provided. Acceptance & Commitment Therapy Implemented. Cognitive Behavioral Therapy Initiated. CSW Collaboration with Daughter, [name] to Assist with Rescheduling Follow-Up Appointment for Patient with Dr. Lilyan Underwood, Primary Care Provider with [facility] (#  [phone]), Via 3-Way Call with Scheduler, Scheduled on 01/18/2023 at 8:20 AM. CSW Collaboration with Daughter, [name] to [facility] Physical Therapy Assessment, through [facility] [phone]), Scheduled on 01/14/2023. CSW Collaboration with Daughter, [name] to [employer] of Wheelchair Ramp for [employer]. CSW Collaboration with Daughter, [name] to Encourage Routine Engagement with Representative from Aging, Disability & Transit Services of [place] [phone]), to Check Status of Patient's Name on Waiting List to Receive Prepared Meal Delivery Services. CSW Collaboration with Daughter, [name] to Encourage Routine Engagement with Representative from Aging, Disability & Transit Services of [place] [phone]), to Check Status of Patient's Name on Waiting List to Receive [facility]  CSW Collaboration with Daughter, [name] to Encourage Routine Engagement with [name], Licensed Clinical Social Worker with [facility] [phone]), if She Has Questions, Needs Assistance, or If Additional Social Work Needs Are Identified Between Now & Our Next Follow-Up Outreach Call, Scheduled on 02/05/2023 at 10:45 AM.      SDOH assessments and interventions completed:  Yes.  Care Coordination Interventions:  Yes, provided.   Follow up plan: Follow up call scheduled for 02/05/2023 at 10:45 am.  Encounter Outcome:  Patient Visit Completed.   [name], BSW, MSW, Location manager Social Work Case Set designer Health  [facility], Population Health Direct Dial: [phone]  Fax: [phone] Email: [email or web address]

## 2023-01-15 ENCOUNTER — Ambulatory Visit: Payer: Self-pay

## 2023-01-15 DIAGNOSIS — M459 Ankylosing spondylitis of unspecified sites in spine: Secondary | ICD-10-CM | POA: Diagnosis not present

## 2023-01-15 DIAGNOSIS — A411 Sepsis due to other specified staphylococcus: Secondary | ICD-10-CM | POA: Diagnosis not present

## 2023-01-15 DIAGNOSIS — I130 Hypertensive heart and chronic kidney disease with heart failure and stage 1 through stage 4 chronic kidney disease, or unspecified chronic kidney disease: Secondary | ICD-10-CM | POA: Diagnosis not present

## 2023-01-15 DIAGNOSIS — N179 Acute kidney failure, unspecified: Secondary | ICD-10-CM | POA: Diagnosis not present

## 2023-01-15 DIAGNOSIS — R7401 Elevation of levels of liver transaminase levels: Secondary | ICD-10-CM | POA: Diagnosis not present

## 2023-01-15 DIAGNOSIS — E559 Vitamin D deficiency, unspecified: Secondary | ICD-10-CM | POA: Diagnosis not present

## 2023-01-15 DIAGNOSIS — J439 Emphysema, unspecified: Secondary | ICD-10-CM | POA: Diagnosis not present

## 2023-01-15 DIAGNOSIS — N1831 Chronic kidney disease, stage 3a: Secondary | ICD-10-CM | POA: Diagnosis not present

## 2023-01-15 DIAGNOSIS — M47812 Spondylosis without myelopathy or radiculopathy, cervical region: Secondary | ICD-10-CM | POA: Diagnosis not present

## 2023-01-15 DIAGNOSIS — G4733 Obstructive sleep apnea (adult) (pediatric): Secondary | ICD-10-CM | POA: Diagnosis not present

## 2023-01-15 DIAGNOSIS — E212 Other hyperparathyroidism: Secondary | ICD-10-CM | POA: Diagnosis not present

## 2023-01-15 DIAGNOSIS — J9611 Chronic respiratory failure with hypoxia: Secondary | ICD-10-CM | POA: Diagnosis not present

## 2023-01-15 DIAGNOSIS — I5032 Chronic diastolic (congestive) heart failure: Secondary | ICD-10-CM | POA: Diagnosis not present

## 2023-01-15 DIAGNOSIS — I2510 Atherosclerotic heart disease of native coronary artery without angina pectoris: Secondary | ICD-10-CM | POA: Diagnosis not present

## 2023-01-15 DIAGNOSIS — M1990 Unspecified osteoarthritis, unspecified site: Secondary | ICD-10-CM | POA: Diagnosis not present

## 2023-01-15 DIAGNOSIS — E785 Hyperlipidemia, unspecified: Secondary | ICD-10-CM | POA: Diagnosis not present

## 2023-01-15 DIAGNOSIS — M8580 Other specified disorders of bone density and structure, unspecified site: Secondary | ICD-10-CM | POA: Diagnosis not present

## 2023-01-15 DIAGNOSIS — E611 Iron deficiency: Secondary | ICD-10-CM | POA: Diagnosis not present

## 2023-01-15 DIAGNOSIS — J4489 Other specified chronic obstructive pulmonary disease: Secondary | ICD-10-CM | POA: Diagnosis not present

## 2023-01-15 DIAGNOSIS — K5792 Diverticulitis of intestine, part unspecified, without perforation or abscess without bleeding: Secondary | ICD-10-CM | POA: Diagnosis not present

## 2023-01-15 DIAGNOSIS — R7303 Prediabetes: Secondary | ICD-10-CM | POA: Diagnosis not present

## 2023-01-15 NOTE — Patient Instructions (Signed)
Visit Information  Thank you for taking time to visit with me today. Please don't hesitate to contact me if I can be of assistance to you.   Following are the goals we discussed today:  Patients daughter will contact DSS to apply for the LIEAP program. Patients daughter will speak to family to assist with private pay PCS. Patients daughter will contact [facility] for the Home Rehab program to apply for assistance with the water leak.   Our next appointment is by telephone on 01/28/23 at 10am  Please call the care guide team at [phone] if you need to cancel or reschedule your appointment.   If you are experiencing a Mental Health or Behavioral Health Crisis or need someone to talk to, please call 911  Patient verbalizes understanding of instructions and care plan provided today and agrees to view in MyChart. Active MyChart status and patient understanding of how to access instructions and care plan via MyChart confirmed with patient.     Telephone follow up appointment with care management team member scheduled for:01/28/23 at 10am.   Lysle Morales, BSW Social Worker [phone]

## 2023-01-15 NOTE — Patient Outreach (Signed)
  Care Coordination   Initial Visit Note   01/15/2023 Name: [name] MRN: [id number] DOB: [birth year]  [name] is a 82 y.o. year old male who sees Luking, [name], MD for primary care. I spoke with  Janace Litten by phone today.  What matters to the patients health and wellness today?  Patient needs additional personal care in the home.    Goals Addressed             This Visit's Progress    Care Coordination Activities       Interventions Today    Flowsheet Row Most Recent Value  Chronic Disease   Chronic disease during today's visit Chronic Obstructive Pulmonary Disease (COPD), Congestive Heart Failure (CHF), Hypertension (HTN), Chronic Kidney Disease/End Stage Renal Disease (ESRD)  General Interventions   General Interventions Discussed/Reviewed General Interventions Discussed, General Interventions Reviewed, [employer], Communication with  [Pt disabled daughter is the only caregiver. Agreed to speak to family to assist w/private pay for PCS. Ref to DSS for LIEAP application for energy bill. Sw will send list of private pay PCS services. Ref to Home Rehab w/[facility] for water leak.]  Communication with --  [SW contacted [facility] and left vm with Esmeralda Links. Ms. Rinaldo Ratel returned the call report pt daughter has made contact to add to waiting list for [facility] Home Care Services.]              SDOH assessments and interventions completed:  Yes  SDOH Interventions Today    Flowsheet Row Most Recent Value  SDOH Interventions   Food Insecurity Interventions Intervention Not Indicated, Other (Comment)  [Family helps]  Housing Interventions Other (Comment)  [Referral to Home Rehab program [facility]]  Transportation Interventions Intervention Not Indicated, Other (Comment)  [Family assist]  Utilities Interventions Intervention Not Indicated        Care Coordination Interventions:  Yes, provided   Follow up plan: Follow up call scheduled for 01/28/23 at  10am,    Encounter Outcome:  Patient Visit Completed

## 2023-01-16 ENCOUNTER — Ambulatory Visit: Payer: Medicare Other | Admitting: Podiatry

## 2023-01-16 ENCOUNTER — Encounter: Payer: Self-pay | Admitting: Podiatry

## 2023-01-16 DIAGNOSIS — N1831 Chronic kidney disease, stage 3a: Secondary | ICD-10-CM

## 2023-01-16 DIAGNOSIS — B351 Tinea unguium: Secondary | ICD-10-CM | POA: Diagnosis not present

## 2023-01-16 DIAGNOSIS — M79676 Pain in unspecified toe(s): Secondary | ICD-10-CM | POA: Diagnosis not present

## 2023-01-16 NOTE — Progress Notes (Signed)
  Subjective:  Patient ID: [name], male    DOB: 1940-08-08,   MRN: [id number]  Chief Complaint  Patient presents with   Nail Problem    Rfc.  Last A1c  6.0    82 y.o. male presents for concern of thickened elongated and painful nails that are difficult to trim. Requesting to have them trimmed today.   PCP:  Babs Sciara, MD    . Denies any other pedal complaints. Denies n/v/f/c.   Past Medical History:  Diagnosis Date   Ankylosing spondylitis ([name]) 11/07/2018   Asthma    BPH (benign prostatic hyperplasia)    CAD (coronary artery disease) 01/1995   CHF (congestive heart failure) ([name])    Clostridium difficile colitis 04/2005   Colitis 2011   COPD (chronic obstructive pulmonary disease) ([name])    Depression    Diverticulosis    DJD (degenerative joint disease)    Gastric ulcer 04/17/10   Three 5mm gastric ulcers, H.pylori serologies were negative   GERD (gastroesophageal reflux disease)    History of kidney stones    Hyperlipidemia    Hypertension    Idiopathic chronic inflammatory bowel disease 05/18/2010   left-sided UC   Kidney stone    Morbid obesity ([name]) 03/12/2018   Obstructive sleep apnea    on Cpap   Reflux 02/1995   S/P endoscopy 07/24/10   retained gastric contents, benign bx    Objective:  Physical Exam: Vascular: DP/PT pulses 2/4 bilateral. CFT <3 seconds. Normal hair growth on digits. No edema.  Skin. No lacerations or abrasions bilateral feet. Nails 1-5 bilateral are thickened elongated and dsytrophic.  Musculoskeletal: MMT 5/5 bilateral lower extremities in DF, PF, Inversion and Eversion. Deceased ROM in DF of ankle joint.  Neurological: Sensation intact to light touch.   Assessment:   1. Pain due to onychomycosis of toenail   2. Stage 3a chronic kidney disease (CKD) ([name])      Plan:  Patient was evaluated and treated and all questions answered. -Mechanically debrided all nails 1-5 bilateral using sterile nail nipper and filed with  dremel without incident  -Answered all patient questions -Patient to return  in 3 months for at risk foot care -Patient advised to call the office if any problems or questions arise in the meantime.   Louann Sjogren, DPM

## 2023-01-17 ENCOUNTER — Ambulatory Visit: Payer: Medicare Other | Admitting: Podiatry

## 2023-01-18 ENCOUNTER — Ambulatory Visit: Payer: Medicare Other | Admitting: Family Medicine

## 2023-01-20 DIAGNOSIS — J9601 Acute respiratory failure with hypoxia: Secondary | ICD-10-CM | POA: Diagnosis not present

## 2023-01-20 DIAGNOSIS — R0902 Hypoxemia: Secondary | ICD-10-CM | POA: Diagnosis not present

## 2023-01-20 DIAGNOSIS — J441 Chronic obstructive pulmonary disease with (acute) exacerbation: Secondary | ICD-10-CM | POA: Diagnosis not present

## 2023-01-21 ENCOUNTER — Ambulatory Visit: Payer: Medicare Other | Admitting: Family Medicine

## 2023-01-21 DIAGNOSIS — M47812 Spondylosis without myelopathy or radiculopathy, cervical region: Secondary | ICD-10-CM | POA: Diagnosis not present

## 2023-01-21 DIAGNOSIS — E785 Hyperlipidemia, unspecified: Secondary | ICD-10-CM | POA: Diagnosis not present

## 2023-01-21 DIAGNOSIS — J439 Emphysema, unspecified: Secondary | ICD-10-CM | POA: Diagnosis not present

## 2023-01-21 DIAGNOSIS — I130 Hypertensive heart and chronic kidney disease with heart failure and stage 1 through stage 4 chronic kidney disease, or unspecified chronic kidney disease: Secondary | ICD-10-CM | POA: Diagnosis not present

## 2023-01-21 DIAGNOSIS — I5032 Chronic diastolic (congestive) heart failure: Secondary | ICD-10-CM | POA: Diagnosis not present

## 2023-01-21 DIAGNOSIS — M1990 Unspecified osteoarthritis, unspecified site: Secondary | ICD-10-CM | POA: Diagnosis not present

## 2023-01-21 DIAGNOSIS — A411 Sepsis due to other specified staphylococcus: Secondary | ICD-10-CM | POA: Diagnosis not present

## 2023-01-21 DIAGNOSIS — M459 Ankylosing spondylitis of unspecified sites in spine: Secondary | ICD-10-CM | POA: Diagnosis not present

## 2023-01-21 DIAGNOSIS — E611 Iron deficiency: Secondary | ICD-10-CM | POA: Diagnosis not present

## 2023-01-21 DIAGNOSIS — E212 Other hyperparathyroidism: Secondary | ICD-10-CM | POA: Diagnosis not present

## 2023-01-21 DIAGNOSIS — M8580 Other specified disorders of bone density and structure, unspecified site: Secondary | ICD-10-CM | POA: Diagnosis not present

## 2023-01-21 DIAGNOSIS — I2510 Atherosclerotic heart disease of native coronary artery without angina pectoris: Secondary | ICD-10-CM | POA: Diagnosis not present

## 2023-01-21 DIAGNOSIS — N1831 Chronic kidney disease, stage 3a: Secondary | ICD-10-CM | POA: Diagnosis not present

## 2023-01-21 DIAGNOSIS — N179 Acute kidney failure, unspecified: Secondary | ICD-10-CM | POA: Diagnosis not present

## 2023-01-21 DIAGNOSIS — R7401 Elevation of levels of liver transaminase levels: Secondary | ICD-10-CM | POA: Diagnosis not present

## 2023-01-21 DIAGNOSIS — R7303 Prediabetes: Secondary | ICD-10-CM | POA: Diagnosis not present

## 2023-01-21 DIAGNOSIS — G4733 Obstructive sleep apnea (adult) (pediatric): Secondary | ICD-10-CM | POA: Diagnosis not present

## 2023-01-21 DIAGNOSIS — J4489 Other specified chronic obstructive pulmonary disease: Secondary | ICD-10-CM | POA: Diagnosis not present

## 2023-01-21 DIAGNOSIS — J9611 Chronic respiratory failure with hypoxia: Secondary | ICD-10-CM | POA: Diagnosis not present

## 2023-01-21 DIAGNOSIS — E559 Vitamin D deficiency, unspecified: Secondary | ICD-10-CM | POA: Diagnosis not present

## 2023-01-21 DIAGNOSIS — K5792 Diverticulitis of intestine, part unspecified, without perforation or abscess without bleeding: Secondary | ICD-10-CM | POA: Diagnosis not present

## 2023-01-22 ENCOUNTER — Encounter: Payer: Self-pay | Admitting: Family Medicine

## 2023-01-22 ENCOUNTER — Ambulatory Visit: Payer: Medicare Other | Admitting: Family Medicine

## 2023-01-28 ENCOUNTER — Ambulatory Visit: Payer: Self-pay

## 2023-01-28 NOTE — Patient Instructions (Signed)
Visit Information  Thank you for taking time to visit with me today. Please don't hesitate to contact me if I can be of assistance to you.   Following are the goals we discussed today:  Patient daughter to contact DSS-LIEAP and [facility] Rehab program. Patient daughter to review PCS list for options Patient daughter will speak to sister about paying for PCS.   Our next appointment is by telephone on 02/18/23 at 11am  Please call the care guide team at [phone] if you need to cancel or reschedule your appointment.   If you are experiencing a Mental Health or Behavioral Health Crisis or need someone to talk to, please call 911  Patient verbalizes understanding of instructions and care plan provided today and agrees to view in MyChart. Active MyChart status and patient understanding of how to access instructions and care plan via MyChart confirmed with patient.     Telephone follow up appointment with care management team member scheduled for: 02/18/23 at 11am.   Lysle Morales, BSW Social Worker [phone]

## 2023-01-28 NOTE — Patient Outreach (Signed)
  Care Coordination   Follow Up Visit Note   01/28/2023 Name: [name] MRN: [id number] DOB: [birth year]  [name] is a 82 y.o. year old male who sees Luking, [name], MD for primary care. I spoke with  [name] by phone today.  What matters to the patients health and wellness today?  Patient daughter will follow up with resources.    Goals Addressed             This Visit's Progress    Care Coordination Activities       Interventions Today    Flowsheet Row Most Recent Value  General Interventions   General Interventions Discussed/Reviewed General Interventions Discussed, General Interventions Reviewed  [Pts daughter has questions about a letter from [facility]. SW reviews but suggests she call about her letter for a detailed explanation.Daughter will review PCS list and discuss with sister.Daughter will contact DSS-LIEAP and Home Rehab.]              SDOH assessments and interventions completed:  No     Care Coordination Interventions:  Yes, provided   Follow up plan: Follow up call scheduled for 02/18/23 at 11am.    Encounter Outcome:  Patient Visit Completed

## 2023-02-05 ENCOUNTER — Ambulatory Visit: Payer: Self-pay | Admitting: *Deleted

## 2023-02-05 NOTE — Patient Outreach (Signed)
 Care Coordination   Follow Up Visit Note   02/05/2023  Name: [name] MRN: [id number] DOB: [birth year]  [name] is a 82 y.o. year old male who sees [name] LABOR, MD for primary care. I spoke with patient's daughter, [name] by phone today.  What matters to the patients health and wellness today?  Receive Assistance Obtaining In-Home Care Services.    Goals Addressed             This Visit's Progress    Receive Assistance Obtaining In-Home Care Services.   On track    Care Coordination Interventions:    Interventions Today    Flowsheet Row Most Recent Value  Chronic Disease   Chronic disease during today's visit Chronic Obstructive Pulmonary Disease (COPD), Congestive Heart Failure (CHF), Hypertension (HTN), Chronic Kidney Disease/End Stage Renal Disease (ESRD), Other  [Morbid Obesity, Major Depression, Prediabetes, Caregiver Burnout, Stress & Fatigue, Inability to Perform Activities of Daily Living Independently, Ulcerative Collitus, Memory Deficits, Recent Fall with Injury, Osteopenia, Cervical Spondylosis.]  General Interventions   General Interventions Discussed/Reviewed General Interventions Discussed, General Interventions Reviewed, Level of Care, Community Resources, Horticulturist, Commercial (DME), Doctor Visits, Communication with, Annual Eye Exam, Labs, Vaccines, Health Screening, Annual Foot Exam  [Encouraged Routine Engagement with Care Team Members & Providers.]  Labs Hgb A1c every 3 months, Kidney Function  [Encouraged Routine Labwork.]  Vaccines COVID-19, Flu, Pneumonia, RSV, Shingles, Tetanus/Pertussis/Diphtheria  [Encouraged Annual Vaccinations.]  Doctor Visits Discussed/Reviewed Doctor Visits Discussed, Specialist, Doctor Visits Reviewed, Annual Wellness Visits, PCP  [Encouraged Routine Engagement with Care Team Members & Providers.]  Health Screening Bone Density, Colonoscopy, Prostate  [Encouraged Annual Health Screenings.]  Durable  Medical Equipment (DME) Bed side commode, BP Cuff, Shower bench, Environmental Consultant, Wheelchair, Other  [Hand-Held [employer], Incontinence Pads, Depends & Wheelchair Ramp.]  Wheelchair Standard  PCP/Specialist Visits Compliance with follow-up visit  [Encouraged Routine Engagement with Care Team Members & Providers.]  Contacted provider for referral to PCP, Specialist, Dentist, Ophthalmologist  [Encouraged Routine Engagement with Care Team Members & Providers.]  Communication with PCP/Specialists, CHARITY FUNDRAISER, Pharmacists, Social Work  [employer] Routine Engagement with Care Team Members & Providers.]  Level of Care Adult Daycare, Air Traffic Controller, Assisted Living, Skilled Nursing Facility  [Confirmed Disinterest in Enrollment in Adult Day Care Program or Receiving Assistance Pursuing Higher Level of Care Placement Options (I.e Assisted Living Versus Skilled Nursing Facility).]  Applications Medicaid, Personal Care Services  [Confirmed Disinterest in Receiving Assistance Applying for Medicaid or Personal Care Services.]  Exercise Interventions   Exercise Discussed/Reviewed Exercise Discussed, Assistive device use and maintanence, Weight Managment, Physical Activity, Exercise Reviewed  [Encouraged Increased Level of Activity & Exercise, Inside & Outside the Home, as Tolerated.]  Physical Activity Discussed/Reviewed Physical Activity Discussed, Home Exercise Program (HEP), PREP, Gym, Physical Activity Reviewed, Types of exercise  [Encouraged Routine Engagement in Activities of Interest.]  Weight Management Weight loss  [Encouraged Healthy Weight Loss Program.]  Education Interventions   Education Provided Provided Education  [Thoroughly Reviewed Educational Material to Ensure Understanding & Entertain Questions.]  Provided Verbal Education On Nutrition, Mental Health/Coping with Illness, When to see the doctor, Foot Care, Eye Care, Labs, Blood Sugar Monitoring, Applications, [employer], Exercise, Medication, Librarian, Academic of Educational Material Provided.]  Labs Reviewed Hgb A1c  [Reviewed.]  Applications Medicaid, Personal Care Services  [Confirmed Disinterest in Receiving Assistance Applying for Medicaid or Personal Care Services.]  Mental Health Interventions   Mental Health Discussed/Reviewed Mental Health Discussed, Anxiety, Depression,  Mental Health Reviewed, Grief and Loss, Substance Abuse, Coping Strategies, Suicide, Crisis, Other  [Assessed Mental Health & Cognitive Status.]  Nutrition Interventions   Nutrition Discussed/Reviewed Nutrition Discussed, Nutrition Reviewed, Carbohydrate meal planning, Adding fruits and vegetables, Increasing proteins, Decreasing fats, Fluid intake, Decreasing salt, Portion sizes, Decreasing sugar intake  [Encouraged Heart-Healthy, Diabetic-Friendly, Renal-Friendly, Low Sodium, Reduced Fat, No Sugar Diet.]  Pharmacy Interventions   Pharmacy Dicussed/Reviewed Pharmacy Topics Discussed, Medications and their functions, Medication Adherence, Affording Medications, Pharmacy Topics Reviewed  [Confirmed Prescription Medication Compliance.]  Medication Adherence --  [Confirmed Ability to Afford Prescription Medications.]  Safety Interventions   Safety Discussed/Reviewed Safety Discussed, Safety Reviewed, Fall Risk, Home Safety  [Encouraged Routine Use of Assistive Devices & Durable Medical Equipment.]  Home Safety Assistive Devices, Need for home safety assessment, Refer for home visit, Contact provider for referral to PT/OT, Refer for community resources, Solicitor home health agency  [facility], [facility], to [facility].]  Advanced Directive Interventions   Advanced Directives Discussed/Reviewed Advanced Directives Discussed, Advanced Directives Reviewed, Advanced Care Planning, Guardianship  [Encouraged Initiation of Advanced Directives (Living Will & Healthcare Power of Sport And Exercise Psychologist), Offering to [employer], Assist with Completion, Make Copies & Scan into Electronic Medical Record in Epic.]  Guardianship Provide resources, Refer to an agency  [Encouraged Daughter, [name] to [employer], Providing Resources.]         Active Listening & Reflection Utilized. Verbalization of Feelings Encouraged.  Emotional Support Provided. Cognitive Behavioral Therapy Initiated. CSW Collaboration with Daughter, [name] to Encourage Routine Engagement with Representative from Aging, Disability & Transit Services of [place] [phone]), to Check Status of Patient's Name on Waiting List to Receive Prepared Meal Delivery Services. CSW Collaboration with Daughter, [name] to Encourage Routine Engagement with Representative from Aging, Disability & Transit Services of [place] [phone]), to Check Status of Patient's Name on Waiting List to Receive [facility].  CSW Collaboration with Daughter, [name] to Encourage Routine Engagement with Peityn Payton, Licensed Clinical Social Worker with [facility] [phone]), if She Has Questions, Needs Assistance, or If Additional Social Work Needs Are Identified Between Now & Our Next Follow-Up Outreach Call, Scheduled on 02/26/2023 at 12:00 PM.      SDOH assessments and interventions completed:  Yes.  Care Coordination Interventions:  Yes, provided.   Follow up plan: Follow up call scheduled for 02/26/2023 at 12:00 pm.  Encounter Outcome:  Patient Visit Completed.   Philippe Desanctis, BSW, MSW, Printmaker Social Work Case Set Designer Health  [facility], Population Health Direct Dial: [phone]  Fax: [phone] Email: [email or web address]

## 2023-02-05 NOTE — Patient Instructions (Signed)
 Visit Information  Thank you for taking time to visit with me today. Please don't hesitate to contact me if I can be of assistance to you.   Following are the goals we discussed today:   Goals Addressed             This Visit's Progress    Receive Assistance Obtaining In-Home Care Services.   On track    Care Coordination Interventions:    Interventions Today    Flowsheet Row Most Recent Value  Chronic Disease   Chronic disease during today's visit Chronic Obstructive Pulmonary Disease (COPD), Congestive Heart Failure (CHF), Hypertension (HTN), Chronic Kidney Disease/End Stage Renal Disease (ESRD), Other  [Morbid Obesity, Major Depression, Prediabetes, Caregiver Burnout, Stress & Fatigue, Inability to Perform Activities of Daily Living Independently, Ulcerative Collitus, Memory Deficits, Recent Fall with Injury, Osteopenia, Cervical Spondylosis.]  General Interventions   General Interventions Discussed/Reviewed General Interventions Discussed, General Interventions Reviewed, Level of Care, Community Resources, Horticulturist, Commercial (DME), Doctor Visits, Communication with, Annual Eye Exam, Labs, Vaccines, Health Screening, Annual Foot Exam  [Encouraged Routine Engagement with Care Team Members & Providers.]  Labs Hgb A1c every 3 months, Kidney Function  [Encouraged Routine Labwork.]  Vaccines COVID-19, Flu, Pneumonia, RSV, Shingles, Tetanus/Pertussis/Diphtheria  [Encouraged Annual Vaccinations.]  Doctor Visits Discussed/Reviewed Doctor Visits Discussed, Specialist, Doctor Visits Reviewed, Annual Wellness Visits, PCP  [Encouraged Routine Engagement with Care Team Members & Providers.]  Health Screening Bone Density, Colonoscopy, Prostate  [Encouraged Annual Health Screenings.]  Durable Medical Equipment (DME) Bed side commode, BP Cuff, Shower bench, Environmental Consultant, Wheelchair, Other  [Hand-Held [employer], Incontinence Pads, Depends & Wheelchair Ramp.]  Wheelchair Standard  PCP/Specialist  Visits Compliance with follow-up visit  [Encouraged Routine Engagement with Care Team Members & Providers.]  Contacted provider for referral to PCP, Specialist, Dentist, Ophthalmologist  [Encouraged Routine Engagement with Care Team Members & Providers.]  Communication with PCP/Specialists, CHARITY FUNDRAISER, Pharmacists, Social Work  [employer] Routine Engagement with Care Team Members & Providers.]  Level of Care Adult Daycare, Air Traffic Controller, Assisted Living, Skilled Nursing Facility  [Confirmed Disinterest in Enrollment in Adult Day Care Program or Receiving Assistance Pursuing Higher Level of Care Placement Options (I.e Assisted Living Versus Skilled Nursing Facility).]  Applications Medicaid, Personal Care Services  [Confirmed Disinterest in Receiving Assistance Applying for Medicaid or Personal Care Services.]  Exercise Interventions   Exercise Discussed/Reviewed Exercise Discussed, Assistive device use and maintanence, Weight Managment, Physical Activity, Exercise Reviewed  [Encouraged Increased Level of Activity & Exercise, Inside & Outside the Home, as Tolerated.]  Physical Activity Discussed/Reviewed Physical Activity Discussed, Home Exercise Program (HEP), PREP, Gym, Physical Activity Reviewed, Types of exercise  [Encouraged Routine Engagement in Activities of Interest.]  Weight Management Weight loss  [Encouraged Healthy Weight Loss Program.]  Education Interventions   Education Provided Provided Education  [Thoroughly Reviewed Educational Material to Ensure Understanding & Entertain Questions.]  Provided Verbal Education On Nutrition, Mental Health/Coping with Illness, When to see the doctor, Foot Care, Eye Care, Labs, Blood Sugar Monitoring, Applications, [facility], Exercise, Medication, Restaurant Manager, Fast Food of Educational Material Provided.]  Labs Reviewed Hgb A1c  [Reviewed.]  Applications Medicaid, Personal Care Services  [Confirmed Disinterest in Receiving Assistance  Applying for Medicaid or Personal Care Services.]  Mental Health Interventions   Mental Health Discussed/Reviewed Mental Health Discussed, Anxiety, Depression, Mental Health Reviewed, Grief and Loss, Substance Abuse, Coping Strategies, Suicide, Crisis, Other  [Assessed Mental Health & Cognitive Status.]  Nutrition Interventions   Nutrition Discussed/Reviewed Nutrition Discussed, Nutrition Reviewed, Carbohydrate  meal planning, Adding fruits and vegetables, Increasing proteins, Decreasing fats, Fluid intake, Decreasing salt, Portion sizes, Decreasing sugar intake  [Encouraged Heart-Healthy, Diabetic-Friendly, Renal-Friendly, Low Sodium, Reduced Fat, No Sugar Diet.]  Pharmacy Interventions   Pharmacy Dicussed/Reviewed Pharmacy Topics Discussed, Medications and their functions, Medication Adherence, Affording Medications, Pharmacy Topics Reviewed  [Confirmed Prescription Medication Compliance.]  Medication Adherence --  [Confirmed Ability to [employer] Prescription Medications.]  Safety Interventions   Safety Discussed/Reviewed Safety Discussed, Safety Reviewed, Fall Risk, Home Safety  [Encouraged Routine Use of Assistive Devices & Durable Medical Equipment.]  Home Safety Assistive Devices, Need for home safety assessment, Refer for home visit, Contact provider for referral to PT/OT, Refer for community resources, Solicitor home health agency  [facility], [facility], to [facility].]  Advanced Directive Interventions   Advanced Directives Discussed/Reviewed Advanced Directives Discussed, Advanced Directives Reviewed, Advanced Care Planning, Guardianship  [Encouraged Initiation of Advanced Directives (Living Will & Healthcare Power of Corporate Treasurer), Offering to [employer], Assist with Completion, Make Copies & Scan into Electronic Medical Record in Epic.]  Guardianship Provide resources, Refer to an agency  [Encouraged Daughter, [name] to [employer], Providing Resources.]         Active Listening & Reflection Utilized. Verbalization of Feelings Encouraged.  Emotional Support Provided. Cognitive Behavioral Therapy Initiated. CSW Collaboration with Daughter, [name] to Encourage Routine Engagement with Representative from Aging, Disability & Transit Services of [place] [phone]), to Check Status of Patient's Name on Waiting List to Receive Prepared Meal Delivery Services. CSW Collaboration with Daughter, [name] to Encourage Routine Engagement with Representative from Aging, Disability & Transit Services of [place] [phone]), to Check Status of Patient's Name on Waiting List to Receive [facility].  CSW Collaboration with Daughter, [name] to Encourage Routine Engagement with Philippe Desanctis, Licensed Clinical Social Worker with [facility] [phone]), if She Has Questions, Needs Assistance, or If Additional Social Work Needs Are Identified Between Now & Our Next Follow-Up Outreach Call, Scheduled on 02/26/2023 at 12:00 PM.      Our next appointment is by telephone on 02/26/2023 at 12:00 pm.  Please call the care guide team at [phone] if you need to cancel or reschedule your appointment.   If you are experiencing a Mental Health or Behavioral Health Crisis or need someone to talk to, please call the Suicide and Crisis Lifeline: 988 call the [place]  National Suicide Prevention Lifeline: 505-870-6817 or TTY: 509-763-3959 TTY 3300623886) to talk to a trained counselor call 1-800-273-TALK (toll free, 24 hour hotline) go to [facility] [address] [place] [phone]) call the [facility] Crisis Line: ([phone] call 911  Patient verbalizes understanding of instructions and care plan provided today and agrees to view in MyChart. Active MyChart status and patient  understanding of how to access instructions and care plan via MyChart confirmed with patient.     Telephone follow up appointment with care management team member scheduled for:   02/26/2023 at 12:00 pm.  Erving Sassano, BSW, MSW, LCSW  Embedded Practice Social Work Case Manager  [facility], Population Health Direct Dial: [phone]  Fax: [phone] Email: [email or web address]: [email or web address]

## 2023-02-07 ENCOUNTER — Telehealth: Payer: Self-pay | Admitting: *Deleted

## 2023-02-07 NOTE — Telephone Encounter (Signed)
 Copied from CRM [id number]. Topic: Appointments - Scheduling Inquiry for Clinic >> Feb 07, 2023  1:57 PM Ivette P wrote: Reason for CRM:  [facility] called in to verify if Pt needs to be re-seen for physical therapy requesting call back at [phone]

## 2023-02-07 NOTE — Telephone Encounter (Signed)
 Whether or not he gets seen for physical therapy is more up to family and the patient if they feel that he needs it

## 2023-02-08 ENCOUNTER — Ambulatory Visit: Payer: Self-pay

## 2023-02-08 NOTE — Telephone Encounter (Signed)
 Called Ryan from [facility] back and informed him dr Gerda Diss states it is up to the family and the pt if he they feel he needs physical therapy

## 2023-02-08 NOTE — Patient Instructions (Signed)
 Visit Information  Thank you for taking time to visit with me today. Please don't hesitate to contact me if I can be of assistance to you.   Following are the goals we discussed today:  Patient daughter to follow up with DSS to apply for LIEAP. Patient will wait for provider to update order for PT.   Our next appointment is by telephone on 03/05/23 at 11am  Please call the care guide team at [phone] if you need to cancel or reschedule your appointment.   If you are experiencing a Mental Health or Behavioral Health Crisis or need someone to talk to, please call 911  Patient verbalizes understanding of instructions and care plan provided today and agrees to view in MyChart. Active MyChart status and patient understanding of how to access instructions and care plan via MyChart confirmed with patient.     Telephone follow up appointment with care management team member scheduled for: 03/05/23 at 11am.   Tillman Gardener, BSW Social Worker  [phone]

## 2023-02-08 NOTE — Patient Outreach (Signed)
  Care Coordination   Follow Up Visit Note   02/08/2023 Name: [name] MRN: [id number] DOB: 1940-12-04  [name] is a 83 y.o. year old male who sees Luking, Glendia LABOR, MD for primary care. I spoke with  Laurier LELON Marina daughter [name] by phone today.  What matters to the patients health and wellness today?  Patient needs to continue PT and is waiting for md orders.     Goals Addressed             This Visit's Progress    Care Coordination Activities       Interventions Today    Flowsheet Row Most Recent Value  General Interventions   General Interventions Discussed/Reviewed General Interventions Discussed, General Interventions Reviewed, Community Resources  [Pt daughter will contact DSS-LIEAP to apply.Pt on waiting list for Home Rehab.Pt receives Ucard $75.[facility] is waiting for orders to resume PT.Family will not be able to assist with paying PCS.Daughter feels PT helps pt do more for himself.]              SDOH assessments and interventions completed:  No     Care Coordination Interventions:  Yes, provided   Follow up plan: Follow up call scheduled for 03/05/23 at 11am.    Encounter Outcome:  Patient Visit Completed

## 2023-02-12 ENCOUNTER — Ambulatory Visit: Payer: Medicare Other | Admitting: Nurse Practitioner

## 2023-02-12 ENCOUNTER — Ambulatory Visit: Payer: Medicare Other | Admitting: Family Medicine

## 2023-02-12 VITALS — BP 132/86 | Ht 73.0 in

## 2023-02-12 DIAGNOSIS — J9611 Chronic respiratory failure with hypoxia: Secondary | ICD-10-CM

## 2023-02-12 DIAGNOSIS — J449 Chronic obstructive pulmonary disease, unspecified: Secondary | ICD-10-CM

## 2023-02-12 DIAGNOSIS — Z6839 Body mass index (BMI) 39.0-39.9, adult: Secondary | ICD-10-CM | POA: Diagnosis not present

## 2023-02-12 NOTE — Assessment & Plan Note (Signed)
Referral placed for pulmonary rehab

## 2023-02-12 NOTE — Assessment & Plan Note (Addendum)
 Patient's oxygen  saturation 94% on room air today.  With exertion, remained at 94%.  He was only able to ambulate for 1 circle around our office and had to stop.  After the office visit was complete, daughter reported that she reached out to [facility] and they will continue providing his oxygen  and they are going to get this straightened out.  Additionally, arranging pulmonary rehab.

## 2023-02-12 NOTE — Progress Notes (Signed)
 Subjective:  Patient ID: [name], male    DOB: Mar 02, 1940  Age: 83 y.o. MRN: [id number]  CC:   Chief Complaint  Patient presents with   Weakness    Was on oxygen  in the past and was wanting to get it re ordered so he could wear it when doing things for more strength- has history of very frequent falls and has to call EMS to get him up    HPI:  83 year old male with an extensive past medical history including coronary artery disease, HFpEF, hypertension, COPD, chronic respiratory failure presents for evaluation of the above.  Daughter reports a generalized weakness.  He has difficulty with frequent falls and has a lot of issues with shortness of breath with exertion.  He currently has oxygen  at home and uses it with exertion.  Daughter states that he is on 4 L of oxygen  with exertion.  She states that this is currently provided by [facility].  There has been some issues/concerns about whether they will continue to provide his oxygen .  Needs to be ambulated today to assess for qualification for oxygen .  Daughter states that [facility] to her office regarding obtaining physical therapy.  Will discuss pulmonary rehab as well.  Patient Active Problem List   Diagnosis Date Noted   Osteopenia 05/23/2022   Vitamin D  deficiency 05/23/2022   Other hyperparathyroidism ([name]) 05/08/2022   Stage 3a chronic kidney disease (CKD) ([name]) 01/19/2022   Depression, major, single episode, moderate ([name]) 02/20/2021   Status post cervical spinal fusion 07/19/2020   Cervical spondylosis 03/07/2020   DNR (do not resuscitate) 08/17/2019   COPD (chronic obstructive pulmonary disease) ([name]) 04/09/2019   Chronic respiratory failure with hypoxia ([name]) 04/09/2019   Obesity with body mass index (BMI) of 30.0 to 39.9 04/09/2019   Ankylosing spondylitis ([name]) 11/07/2018   Chronic heart failure with preserved ejection fraction (HFpEF) ([name]) 03/27/2018   Morbid obesity ([name]) 03/12/2018    History of carpal tunnel surgery of left wrist with unlar head resection 01/17/17 01/24/2017   Hx of skin cancer, basal cell 11/30/2015   Erectile dysfunction 06/06/2015   Iron deficiency 08/18/2014   Essential hypertension 01/09/2013   Dyslipidemia, goal LDL below 70 01/09/2013   CAD (coronary artery disease) 12/14/2012   Hiatal hernia 12/14/2012   GERD (gastroesophageal reflux disease) 12/14/2012   Prediabetes 12/09/2012   BPH (benign prostatic hyperplasia) 11/18/2012   OSA treated with BiPAP 04/30/2012   Ulcerative colitis ([name]) 04/29/2012    Social Hx   Social History   Socioeconomic History   Marital status: Single    Spouse name: Not on file   Number of children: 2   Years of education: 12   Highest education level: 3rd grade  Occupational History   Occupation: maintenance    Comment: retired  Tobacco Use   Smoking status: Former    Current packs/day: 0.00    Average packs/day: 1 pack/day for 20.0 years (20.0 ttl pk-yrs)    Types: Cigarettes    Start date: 09/30/1949    Quit date: 09/30/1969    Years since quitting: 53.4    Passive exposure: Past   Smokeless tobacco: Never   Tobacco comments:    Verified by Daughter, [name]  Vaping Use   Vaping status: Never Used  Substance and Sexual Activity   Alcohol  use: No   Drug use: No   Sexual activity: Not Currently    Partners: Female    Birth control/protection: None  Comment: spouse  Other Topics Concern   Not on file  Social History Narrative   Not on file   Social Drivers of Health   Financial Resource Strain: Low Risk  (12/24/2022)   Overall Financial Resource Strain (CARDIA)    Difficulty of Paying Living Expenses: Not hard at all  Food Insecurity: No Food Insecurity (01/15/2023)   Hunger Vital Sign    Worried About Running Out of Food in the Last Year: Never true    Ran Out of Food in the Last Year: Never true  Transportation Needs: No Transportation Needs (01/15/2023)   PRAPARE -  Administrator, Civil Service (Medical): No    Lack of Transportation (Non-Medical): No  Physical Activity: Inactive (12/24/2022)   Exercise Vital Sign    Days of Exercise per Week: 0 days    Minutes of Exercise per Session: 0 min  Stress: No Stress Concern Present (12/24/2022)   [employer] of Occupational Health - Occupational Stress Questionnaire    Feeling of Stress : Not at all  Social Connections: Moderately Isolated (12/24/2022)   Social Connection and Isolation Panel [NHANES]    Frequency of Communication with Friends and Family: More than three times a week    Frequency of Social Gatherings with Friends and Family: More than three times a week    Attends Religious Services: More than 4 times per year    Active Member of [employer] or Organizations: No    Attends Banker Meetings: Never    Marital Status: Widowed    Review of Systems Per HPI  Objective:  BP 132/86   Ht 6' 1 (1.854 m)   SpO2 94%   BMI 39.18 kg/m      02/12/2023    3:11 PM 12/21/2022    2:59 PM 12/06/2022   10:18 AM  BP/Weight  Systolic BP 132 123 137  Diastolic BP 86 62 53  Wt. (Lbs)  297   BMI  39.18 kg/m2     Physical Exam Vitals and nursing note reviewed.  Constitutional:      General: He is not in acute distress.    Appearance: He is obese.  HENT:     Head: Normocephalic and atraumatic.  Eyes:     General:        Right eye: No discharge.        Left eye: No discharge.     Conjunctiva/sclera: Conjunctivae normal.  Cardiovascular:     Rate and Rhythm: Normal rate and regular rhythm.  Pulmonary:     Effort: Pulmonary effort is normal.     Breath sounds: No wheezing or rales.  Neurological:     General: No focal deficit present.     Mental Status: He is alert.  Psychiatric:        Mood and Affect: Mood normal.        Behavior: Behavior normal.     Lab Results  Component Value Date   WBC 12.9 (H) 12/21/2022   HGB 12.5 (L) 12/21/2022   HCT 37.6  12/21/2022   PLT 387 12/21/2022   GLUCOSE 91 12/21/2022   CHOL 172 09/10/2022   TRIG 298 (H) 09/10/2022   HDL 41 09/10/2022   LDLCALC 82 09/10/2022   ALT 30 12/06/2022   AST 34 12/06/2022   NA 140 12/21/2022   K 4.7 12/21/2022   CL 101 12/21/2022   CREATININE 1.64 (H) 12/21/2022   BUN 30 (H) 12/21/2022   CO2 23 12/21/2022  TSH 3.680 11/10/2020   INR 1.0 12/02/2022   HGBA1C 6.0 (H) 09/10/2022     Assessment & Plan:   Problem List Items Addressed This Visit       Respiratory   Chronic respiratory failure with hypoxia ([name])   Patient's oxygen  saturation 94% on room air today.  With exertion, remained at 94%.  He was only able to ambulate for 1 circle around our office and had to stop.  After the office visit was complete, daughter reported that she reached out to [facility] and they will continue providing his oxygen  and they are going to get this straightened out.  Additionally, arranging pulmonary rehab.        COPD (chronic obstructive pulmonary disease) ([name]) - Primary   Referral placed for pulmonary rehab.      Relevant Orders   AMB referral to pulmonary rehabilitation     Other   Morbid obesity ([name])   Relevant Orders   AMB referral to pulmonary rehabilitation    Follow-up:  With Dr. Alphonsa as he normally does.  Jacqulyn Ahle DO [facility]

## 2023-02-12 NOTE — Patient Instructions (Signed)
 I will be in contact with Dr. Lorin Picket.  Referral to pulmonary rehab placed.  Take care  Dr. Adriana Simas

## 2023-02-14 DIAGNOSIS — J449 Chronic obstructive pulmonary disease, unspecified: Secondary | ICD-10-CM | POA: Diagnosis not present

## 2023-02-18 ENCOUNTER — Other Ambulatory Visit: Payer: Self-pay | Admitting: Family Medicine

## 2023-02-20 DIAGNOSIS — J9601 Acute respiratory failure with hypoxia: Secondary | ICD-10-CM | POA: Diagnosis not present

## 2023-02-20 DIAGNOSIS — J441 Chronic obstructive pulmonary disease with (acute) exacerbation: Secondary | ICD-10-CM | POA: Diagnosis not present

## 2023-02-20 DIAGNOSIS — R0902 Hypoxemia: Secondary | ICD-10-CM | POA: Diagnosis not present

## 2023-02-21 ENCOUNTER — Telehealth: Payer: Self-pay | Admitting: Family Medicine

## 2023-02-21 MED ORDER — DULOXETINE HCL 60 MG PO CPEP: ORAL_CAPSULE | ORAL | 1 refills | Status: DC

## 2023-02-21 NOTE — Telephone Encounter (Signed)
Refill on DULoxetine (CYMBALTA) 60 MG capsule send to [facility]

## 2023-02-26 ENCOUNTER — Ambulatory Visit: Payer: Self-pay | Admitting: *Deleted

## 2023-02-26 ENCOUNTER — Other Ambulatory Visit: Payer: Self-pay | Admitting: Family Medicine

## 2023-02-26 MED ORDER — NITROGLYCERIN 0.4 MG SL SUBL: 0.4000 mg | SUBLINGUAL_TABLET | SUBLINGUAL | 3 refills | Status: DC | PRN

## 2023-02-26 NOTE — Patient Instructions (Signed)
Visit Information  Thank you for taking time to visit with me today. Please don't hesitate to contact me if I can be of assistance to you.   Following are the goals we discussed today:   Goals Addressed             This Visit's Progress    Receive Assistance Obtaining In-Home Care Services.   On track    Care Coordination Interventions:  Interventions Today    Flowsheet Row Most Recent Value  Chronic Disease   Chronic disease during today's visit Chronic Obstructive Pulmonary Disease (COPD), Congestive Heart Failure (CHF), Hypertension (HTN), Chronic Kidney Disease/End Stage Renal Disease (ESRD), Other  [Morbid Obesity, Major Depression, Prediabetes, Caregiver Burnout, Stress & Fatigue, Inability to Perform Activities of Daily Living Independently, Ulcerative Collitus, Memory Deficits, Recent Fall with Injury, Osteopenia, Cervical Spondylosis.]  General Interventions   General Interventions Discussed/Reviewed General Interventions Discussed, General Interventions Reviewed, Level of Care, Community Resources, Horticulturist, commercial (DME), Doctor Visits, Communication with, Annual Eye Exam, Labs, Vaccines, Health Screening, Annual Foot Exam  [Encouraged Routine Engagement with Care Team Members & Providers.]  Labs Hgb A1c every 3 months, Kidney Function  [Encouraged Routine Labwork.]  Vaccines COVID-19, Flu, Pneumonia, RSV, Shingles, Tetanus/Pertussis/Diphtheria  [Encouraged Annual Vaccinations.]  Doctor Visits Discussed/Reviewed Doctor Visits Discussed, Specialist, Doctor Visits Reviewed, Annual Wellness Visits, PCP  [Encouraged Routine Engagement with Care Team Members & Providers.]  Health Screening Bone Density, Colonoscopy, Prostate  [Encouraged Annual Health Screenings.]  Durable Medical Equipment (DME) Bed side commode, BP Cuff, Shower bench, Environmental consultant, Wheelchair, Other  [Hand-Held [employer], Incontinence Pads, Depends & Wheelchair Ramp.]  Wheelchair Standard  PCP/Specialist  Visits Compliance with follow-up visit  [Encouraged Routine Engagement with Care Team Members & Providers.]  Contacted provider for referral to PCP, Specialist, Dentist, Ophthalmologist  [Encouraged Routine Engagement with Care Team Members & Providers.]  Communication with PCP/Specialists, Charity fundraiser, Pharmacists, Social Work  [employer] Routine Engagement with Care Team Members & Providers.]  Level of Care Adult Daycare, Air traffic controller, Assisted Living, Skilled Nursing Facility  [Confirmed Disinterest in Enrollment in Adult Day Care Program or Receiving Assistance Pursuing Higher Level of Care Placement Options (I.e Assisted Living Versus Skilled Nursing Facility).]  Applications Medicaid, Personal Care Services  [Confirmed Disinterest in Receiving Assistance Applying for Medicaid or Personal Care Services.]  Exercise Interventions   Exercise Discussed/Reviewed Exercise Discussed, Assistive device use and maintanence, Weight Managment, Physical Activity, Exercise Reviewed  [Encouraged Increased Level of Activity & Exercise, Inside & Outside the Home, as Tolerated.]  Physical Activity Discussed/Reviewed Physical Activity Discussed, Home Exercise Program (HEP), PREP, Gym, Physical Activity Reviewed, Types of exercise  [Encouraged Routine Engagement in Activities of Interest.]  Weight Management Weight loss  [Encouraged Healthy Weight Loss Program.]  Education Interventions   Education Provided Provided Education  [Thoroughly Reviewed Educational Material to Ensure Understanding & Entertain Questions.]  Provided Verbal Education On Nutrition, Mental Health/Coping with Illness, When to see the doctor, Foot Care, Eye Care, Labs, Blood Sugar Monitoring, Applications, [employer], Exercise, Medication, Restaurant manager, fast food of Educational Material Provided.]  Labs Reviewed Hgb A1c  [Reviewed.]  Applications Medicaid, Personal Care Services  [Confirmed Disinterest in Receiving Assistance  Applying for Medicaid or Personal Care Services.]  Mental Health Interventions   Mental Health Discussed/Reviewed Mental Health Discussed, Anxiety, Depression, Mental Health Reviewed, Grief and Loss, Substance Abuse, Coping Strategies, Suicide, Crisis, Other  [Assessed Mental Health & Cognitive Status.]  Nutrition Interventions   Nutrition Discussed/Reviewed Nutrition Discussed, Nutrition Reviewed, Carbohydrate meal planning,  Adding fruits and vegetables, Increasing proteins, Decreasing fats, Fluid intake, Decreasing salt, Portion sizes, Decreasing sugar intake  [Encouraged Heart-Healthy, Diabetic-Friendly, Renal-Friendly, Low Sodium, Reduced Fat, No Sugar Diet.]  Pharmacy Interventions   Pharmacy Dicussed/Reviewed Pharmacy Topics Discussed, Medications and their functions, Medication Adherence, Affording Medications, Pharmacy Topics Reviewed  [Confirmed Prescription Medication Compliance.]  Medication Adherence --  [Confirmed Ability to [employer] Prescription Medications.]  Safety Interventions   Safety Discussed/Reviewed Safety Discussed, Safety Reviewed, Fall Risk, Home Safety  [Encouraged Routine Use of Assistive Devices & Durable Medical Equipment.]  Home Safety Assistive Devices, Need for home safety assessment, Refer for home visit, Contact provider for referral to PT/OT, Refer for community resources, Solicitor home health agency  [facility], [facility], to [facility].]  Advanced Directive Interventions   Advanced Directives Discussed/Reviewed Advanced Directives Discussed, Advanced Directives Reviewed, Advanced Care Planning, Guardianship  [Encouraged Initiation of Advanced Directives (Living Will & Healthcare Power of Corporate treasurer), Offering to [employer], Assist with Completion, Make Copies & Scan into Electronic Medical Record in Epic.]  Guardianship Provide resources, Refer to an agency  [Encouraged Daughter, [name] to [employer], Providing Resources.]         Active Listening & Reflection Utilized. Verbalization of Feelings Encouraged.  Emotional Support Provided. Cognitive Behavioral Therapy Initiated. CSW Collaboration with Dr. Lilyan Punt, Primary Care Provider with [facility] [phone]# [phone]), Via Secure Chat Message & Routed Note in Epic, to Request Refill for Nitroglycerin, Per Daughter, Katherene Ponto Request. CSW Collaboration with Daughter, [name] to Confirm Ineligibility to Receive Prepared Meal Delivery Services through [employer], through Aging, Disability, & Transit Services of [place] [phone]), Due to Current Place of Residence Being Outside of [place] Limits.  CSW Collaboration with Daughter, [name] to Confirm Ineligibility to [employer] through Aging, Disability, & Transit Services of [place] [phone]), Due to Current Place of Residence Being Outside of [place] Limits.  CSW Collaboration with Daughter, [name] to Encourage Routine Engagement with Danford Bad, Licensed Clinical Social Worker with [facility] [phone]), if She Has Questions, Needs Assistance, or If Additional Social Work Needs Are Identified Between Now & Our Next Follow-Up Outreach Call, Scheduled on 03/19/2023 at 11:15 AM.      Our next appointment is by telephone on 03/19/2023 at 11:15 am.  Please call the care guide team at [phone] if you need to cancel or reschedule your appointment.   If you are experiencing a Mental Health or Behavioral Health Crisis or need someone to talk to, please call the Suicide and Crisis Lifeline: 988 call the [place] National Suicide Prevention Lifeline: 509-156-5211 or TTY: 707-508-7308 TTY 678-388-5483) to talk to a trained counselor call 1-800-273-TALK (toll free, 24 hour hotline) go to [facility] [address], [place] [phone]) call the [facility] Crisis Line: [phone] call 911  Patient verbalizes understanding of instructions and care plan provided today and agrees to view in MyChart. Active MyChart status and patient understanding of how to access instructions and care plan via MyChart confirmed with patient.     Telephone follow up appointment with care management team member scheduled for:  03/19/2023 at 11:15 am.  Danford Bad, BSW, MSW, LCSW [facility], [facility] Clinical Social Worker II Direct Dial: [phone]  Fax: [phone] Website: [email or web address]

## 2023-02-26 NOTE — Patient Outreach (Signed)
Care Coordination   Follow Up Visit Note   02/26/2023  Name: [name] MRN: [id number] DOB: Feb 08, 1940  [name] is a 83 y.o. year old male who sees Luking, [name], MD for primary care. I spoke with patient's daughter, [name] by phone today.  What matters to the patients health and wellness today?  Receive Assistance Obtaining In-Home Care Services.     Goals Addressed             This Visit's Progress    Receive Assistance Obtaining In-Home Care Services.   On track    Care Coordination Interventions:  Interventions Today    Flowsheet Row Most Recent Value  Chronic Disease   Chronic disease during today's visit Chronic Obstructive Pulmonary Disease (COPD), Congestive Heart Failure (CHF), Hypertension (HTN), Chronic Kidney Disease/End Stage Renal Disease (ESRD), Other  [Morbid Obesity, Major Depression, Prediabetes, Caregiver Burnout, Stress & Fatigue, Inability to Perform Activities of Daily Living Independently, Ulcerative Collitus, Memory Deficits, Recent Fall with Injury, Osteopenia, Cervical Spondylosis.]  General Interventions   General Interventions Discussed/Reviewed General Interventions Discussed, General Interventions Reviewed, Level of Care, Community Resources, Horticulturist, commercial (DME), Doctor Visits, Communication with, Annual Eye Exam, Labs, Vaccines, Health Screening, Annual Foot Exam  [Encouraged Routine Engagement with Care Team Members & Providers.]  Labs Hgb A1c every 3 months, Kidney Function  [Encouraged Routine Labwork.]  Vaccines COVID-19, Flu, Pneumonia, RSV, Shingles, Tetanus/Pertussis/Diphtheria  [Encouraged Annual Vaccinations.]  Doctor Visits Discussed/Reviewed Doctor Visits Discussed, Specialist, Doctor Visits Reviewed, Annual Wellness Visits, PCP  [Encouraged Routine Engagement with Care Team Members & Providers.]  Health Screening Bone Density, Colonoscopy, Prostate  [Encouraged Annual Health Screenings.]  Durable  Medical Equipment (DME) Bed side commode, BP Cuff, Shower bench, Environmental consultant, Wheelchair, Other  [Hand-Held [employer], Incontinence Pads, Depends & Wheelchair Ramp.]  Wheelchair Standard  PCP/Specialist Visits Compliance with follow-up visit  [Encouraged Routine Engagement with Care Team Members & Providers.]  Contacted provider for referral to PCP, Specialist, Dentist, Ophthalmologist  [Encouraged Routine Engagement with Care Team Members & Providers.]  Communication with PCP/Specialists, Charity fundraiser, Pharmacists, Social Work  [employer] Routine Engagement with Care Team Members & Providers.]  Level of Care Adult Daycare, Air traffic controller, Assisted Living, Skilled Nursing Facility  [Confirmed Disinterest in Enrollment in Adult Day Care Program or Receiving Assistance Pursuing Higher Level of Care Placement Options (I.e Assisted Living Versus Skilled Nursing Facility).]  Applications Medicaid, Personal Care Services  [Confirmed Disinterest in Receiving Assistance Applying for Medicaid or Personal Care Services.]  Exercise Interventions   Exercise Discussed/Reviewed Exercise Discussed, Assistive device use and maintanence, Weight Managment, Physical Activity, Exercise Reviewed  [Encouraged Increased Level of Activity & Exercise, Inside & Outside the Home, as Tolerated.]  Physical Activity Discussed/Reviewed Physical Activity Discussed, Home Exercise Program (HEP), PREP, Gym, Physical Activity Reviewed, Types of exercise  [Encouraged Routine Engagement in Activities of Interest.]  Weight Management Weight loss  [Encouraged Healthy Weight Loss Program.]  Education Interventions   Education Provided Provided Education  [Thoroughly Reviewed Educational Material to Ensure Understanding & Entertain Questions.]  Provided Verbal Education On Nutrition, Mental Health/Coping with Illness, When to see the doctor, Foot Care, Eye Care, Labs, Blood Sugar Monitoring, Applications, [employer], Exercise, Medication, Librarian, academic of Educational Material Provided.]  Labs Reviewed Hgb A1c  [Reviewed.]  Applications Medicaid, Personal Care Services  [Confirmed Disinterest in Receiving Assistance Applying for Medicaid or Personal Care Services.]  Mental Health Interventions   Mental Health Discussed/Reviewed Mental Health Discussed, Anxiety, Depression, Mental  Health Reviewed, Grief and Loss, Substance Abuse, Coping Strategies, Suicide, Crisis, Other  [Assessed Mental Health & Cognitive Status.]  Nutrition Interventions   Nutrition Discussed/Reviewed Nutrition Discussed, Nutrition Reviewed, Carbohydrate meal planning, Adding fruits and vegetables, Increasing proteins, Decreasing fats, Fluid intake, Decreasing salt, Portion sizes, Decreasing sugar intake  [Encouraged Heart-Healthy, Diabetic-Friendly, Renal-Friendly, Low Sodium, Reduced Fat, No Sugar Diet.]  Pharmacy Interventions   Pharmacy Dicussed/Reviewed Pharmacy Topics Discussed, Medications and their functions, Medication Adherence, Affording Medications, Pharmacy Topics Reviewed  [Confirmed Prescription Medication Compliance.]  Medication Adherence --  [Confirmed Ability to Afford Prescription Medications.]  Safety Interventions   Safety Discussed/Reviewed Safety Discussed, Safety Reviewed, Fall Risk, Home Safety  [Encouraged Routine Use of Assistive Devices & Durable Medical Equipment.]  Home Safety Assistive Devices, Need for home safety assessment, Refer for home visit, Contact provider for referral to PT/OT, Refer for community resources, Solicitor home health agency  [facility], [facility], to [facility].]  Advanced Directive Interventions   Advanced Directives Discussed/Reviewed Advanced Directives Discussed, Advanced Directives Reviewed, Advanced Care Planning, Guardianship  [Encouraged Initiation of Advanced Directives (Living Will & Healthcare Power of Sport and exercise psychologist), Offering to [employer], Assist with Completion, Make Copies & Scan into Electronic Medical Record in Epic.]  Guardianship Provide resources, Refer to an agency  [Encouraged Daughter, [name] to [employer], Providing Resources.]         Active Listening & Reflection Utilized. Verbalization of Feelings Encouraged.  Emotional Support Provided. Cognitive Behavioral Therapy Initiated. CSW Collaboration with Dr. Lilyan Punt, Primary Care Provider with [facility] [phone]# [phone]), Via Secure Chat Message & Routed Note in Epic, to Request Refill for Nitroglycerin, Per Daughter, Katherene Ponto Request. CSW Collaboration with Daughter, [name] to Confirm Ineligibility to Receive Prepared Meal Delivery Services through [employer], through Aging, Disability, & Transit Services of [place] [phone]), Due to Current Place of Residence Being Outside of [place] Limits.  CSW Collaboration with Daughter, [name] to Confirm Ineligibility to [employer] through Aging, Disability, & Transit Services of [place] [phone]), Due to Current Place of Residence Being Outside of [place] Limits.  CSW Collaboration with Daughter, [name] to Encourage Routine Engagement with Danford Bad, Licensed Clinical Social Worker with [facility] [phone]), if She Has Questions, Needs Assistance, or If Additional Social Work Needs Are Identified Between Now & Our Next Follow-Up Outreach Call, Scheduled on 03/19/2023 at 11:15 AM.      SDOH assessments and interventions completed:  Yes.  Care Coordination Interventions:  Yes, provided.   Follow up plan: Follow up call scheduled for 03/19/2023 at 11:15 am.  Encounter Outcome:  Patient Visit Completed.   Danford Bad, BSW, MSW, LCSW [facility], [facility] Clinical Social Worker II Direct  Dial: [phone]  Fax: [phone] Website: [email or web address]

## 2023-02-28 ENCOUNTER — Ambulatory Visit: Payer: Medicare Other | Admitting: Podiatry

## 2023-02-28 ENCOUNTER — Encounter: Payer: Self-pay | Admitting: Podiatry

## 2023-02-28 ENCOUNTER — Other Ambulatory Visit: Payer: Self-pay | Admitting: Podiatry

## 2023-02-28 ENCOUNTER — Ambulatory Visit (INDEPENDENT_AMBULATORY_CARE_PROVIDER_SITE_OTHER): Payer: Medicare Other

## 2023-02-28 DIAGNOSIS — S90122A Contusion of left lesser toe(s) without damage to nail, initial encounter: Secondary | ICD-10-CM

## 2023-02-28 DIAGNOSIS — L853 Xerosis cutis: Secondary | ICD-10-CM

## 2023-02-28 DIAGNOSIS — M79674 Pain in right toe(s): Secondary | ICD-10-CM

## 2023-02-28 MED ORDER — AMMONIUM LACTATE 12 % EX LOTN: 1.0000 | TOPICAL_LOTION | CUTANEOUS | 0 refills | Status: DC | PRN

## 2023-02-28 NOTE — Progress Notes (Signed)
  Subjective:  Patient ID: [name], male    DOB: [birth year],  MRN: [id number]  Chief Complaint  Patient presents with   Toe Pain    Patient's daughter states he bumps his toes a lot and has pain in the toes, 2nd toe left is bruised    83 y.o. male presents with the above complaint. History confirmed with patient. Patient does not have a history of T2DM, states he is prediabetic.  He is accompanied by his daughters.  They state that he often bangs his toes at home and state they have noticed some bruising. Recently had nails trimmed by Dr. Ralene Cork  Objective:  Physical Exam: warm, good capillary refill.  Dry pedal skin. nail exam onychomycosis of the toenails, onycholysis, and dystrophic nails DP pulses palpable, PT pulses palpable, and protective sensation intact Left Foot:  Pain with palpation of nails due to elongation and dystrophic growth.  Mild ecchymosis noted to the left second toe. Right Foot: Pain with palpation of nails due to elongation and dystrophic growth.  Some mild tenderness on palpation of the right second PIPJ.  Presenting in wheelchair today.  Left and right foot radiographs 3 views: Normal osseous mineralization.  Joint spaces well-preserved.  No acute fractures or acute osseous abnormalities. Assessment:   1. Contusion of lesser toe of left foot without damage to nail, initial encounter   2. Pain of toe of right foot   3. Xerosis cutis      Plan:  Patient was evaluated and treated and all questions answered.  # Left second toe contusion, right second toe tenderness -Toes appear stable.  No injury seen on radiographs.  Reviewed with patient and family -Expect mild issues to resolve on their own - Instructed patient to wear house shoes to prevent bumping against objects.  # Xerosis cutis -AmLactin prescribed to the patient to be applied as needed -Continue with moisturization  Return if symptoms worsen or fail to improve.  May otherwise follow-up  for routine care as scheduled.        Bronwen Betters, DPM [facility] / [facility]

## 2023-03-05 ENCOUNTER — Ambulatory Visit: Payer: Self-pay

## 2023-03-05 NOTE — Patient Outreach (Signed)
  Care Coordination   Follow Up Visit Note   03/05/2023 Name: [name] MRN: [id number] DOB: [birth year]  [name] is a 83 y.o. year old male who sees Luking, [name], MD for primary care. I spoke with  Janace Litten daughter [name] by phone today.  What matters to the patients health and wellness today?  Patient has improved and no longer needs PT.    Goals Addressed             This Visit's Progress    Care Coordination Activities       Interventions Today    Flowsheet Row Most Recent Value  General Interventions   General Interventions Discussed/Reviewed General Interventions Discussed, General Interventions Reviewed  [Pt daughter reports improvement and pt does not need PT. Rx changed and patient is ambulating better.Ucard $75 and does not need LIEAP assistance.]              SDOH assessments and interventions completed:  No     Care Coordination Interventions:  Yes, provided   Follow up plan: No further intervention required.   Encounter Outcome:  Patient Visit Completed

## 2023-03-05 NOTE — Patient Instructions (Signed)
Visit Information  Thank you for taking time to visit with me today. Please don't hesitate to contact me if I can be of assistance to you.   Following are the goals we discussed today:  Patient continues to improve and does not need PT.     If you are experiencing a Mental Health or Behavioral Health Crisis or need someone to talk to, please call 911  Patient verbalizes understanding of instructions and care plan provided today and agrees to view in MyChart. Active MyChart status and patient understanding of how to access instructions and care plan via MyChart confirmed with patient.     No further follow up required: Patient does not request a follow up visit.  Lysle Morales, BSW [place]  [facility], [facility] Social Worker Direct Dial: [phone]  Fax: [phone] Website: [email or web address]

## 2023-03-19 ENCOUNTER — Encounter: Payer: Self-pay | Admitting: *Deleted

## 2023-03-19 ENCOUNTER — Ambulatory Visit: Payer: Self-pay | Admitting: *Deleted

## 2023-03-19 NOTE — Patient Outreach (Signed)
Care Coordination   Follow Up Visit Note   03/19/2023  Name: [name] MRN: [id number] DOB: April 26, 1940  [name] is a 83 y.o. year old male who sees Luking, [name], MD for primary care. I spoke with patient's daughter, [name] by phone today.  What matters to the patients health and wellness today?  Receive Assistance Obtaining In-Home Care Services.     Goals Addressed             This Visit's Progress    Receive Assistance Obtaining In-Home Care Services.   On track    Care Coordination Interventions:  Interventions Today    Flowsheet Row Most Recent Value  Chronic Disease   Chronic disease during today's visit Chronic Obstructive Pulmonary Disease (COPD), Congestive Heart Failure (CHF), Hypertension (HTN), Chronic Kidney Disease/End Stage Renal Disease (ESRD), Other  [Morbid Obesity, Major Depression, Prediabetes, Caregiver Burnout, Stress & Fatigue, Inability to Perform Activities of Daily Living Independently, Ulcerative Collitus, Memory Deficits, Recent Fall with Injury, Osteopenia, Cervical Spondylosis.]  General Interventions   General Interventions Discussed/Reviewed General Interventions Discussed, General Interventions Reviewed, Communication with, Level of Care, [facility], Doctor Visits  [Encouraged Routine Engagement with Care Team Members & Providers.]  Doctor Visits Discussed/Reviewed Doctor Visits Discussed, Specialist, Doctor Visits Reviewed, Annual Wellness Visits, PCP  [Encouraged Routine Engagement with Care Team Members & Providers.]  PCP/Specialist Visits Compliance with follow-up visit  [Encouraged Routine Engagement with Care Team Members & Providers.]  Contacted provider for referral to PCP, Specialist, Dentist, Ophthalmologist  [Encouraged Routine Engagement with Care Team Members & Providers.]  Communication with PCP/Specialists, RN, Pharmacists, Social Work  [employer] Routine Engagement with Care Team Members &  Providers.]  Level of Care Adult Daycare, Assisted Living, Skilled Nursing Facility  [Confirmed Disinterest in Enrollment in Adult Day Care Program or Receiving Assistance Pursuing Higher Level of Care Placement Options (I.e Assisted Living Versus Skilled Nursing Facility).]  Applications Medicaid, Personal Care Services  [Confirmed Disinterest in Receiving Assistance Applying for Medicaid or Personal Care Services.]  Education Interventions   Education Provided Provided Education  [Thoroughly Reviewed Educational Material to Ensure Understanding & Entertain Questions.]  Provided Verbal Education On Mental Health/Coping with Illness, When to see the doctor, [facility], Applications  [Encouraged Continued Review of Educational Material & Consideration of Implementation.]  Ship broker, Personal Care Services  [Confirmed Disinterest in Receiving Assistance Applying for Medicaid or Personal Care Services.]  Mental Health Interventions   Mental Health Discussed/Reviewed Mental Health Discussed, Anxiety, Depression, Grief and Loss, Mental Health Reviewed, Crisis, Other, Suicide, Coping Strategies, Substance Abuse  [Assessed Mental Health & Cognitive Status. Provided Caregiver Support & Resources.]  Safety Interventions   Safety Discussed/Reviewed Safety Discussed, Safety Reviewed      Active Listening & Reflection Utilized. Verbalization of Feelings Encouraged.  Emotional Support Provided. Cognitive Behavioral Therapy Performed. Acceptance & Commitment Therapy Initiated. CSW Collaboration with Daughter, [name] to Encourage Routine Engagement with Danford Bad, Licensed Clinical Social Worker with [facility], [facility] [phone]), if She Has Questions, Needs Assistance, or If Additional Social Work Needs Are Identified Between Now & Our Next Follow-Up Outreach Call, Scheduled on 04/22/2023 at 9:45 AM.        SDOH assessments  and interventions completed:  Yes.  SDOH Interventions Today    Flowsheet Row Most Recent Value  SDOH Interventions   Food Insecurity Interventions Intervention Not Indicated, Community Resources Provided  Housing Interventions Intervention Not Indicated  Transportation Interventions Intervention Not Indicated,  Patient Resources (Friends/Family)  Utilities Interventions Intervention Not Indicated  Alcohol Usage Interventions Intervention Not Indicated (Score <7)  Depression Interventions/Treatment  Medication, Counseling, Currently on Treatment, Community Resources Provided  Financial Strain Interventions Intervention Not Indicated  Physical Activity Interventions Patient Declined, Community Resources Provided  Stress Interventions Intervention Not Indicated  Social Connections Interventions Community Resources Provided, Patient Declined  Health Literacy Interventions Intervention Not Indicated     Care Coordination Interventions:  Yes, provided.   Follow up plan: Follow up call scheduled for 04/22/2023 at 9:45 am.  Encounter Outcome:  Patient Visit Completed.    Danford Bad, BSW, MSW, LCSW [facility], [facility] Clinical Social Worker II Direct Dial: [phone]  Fax: [phone] Website: [email or web address]

## 2023-03-19 NOTE — Patient Instructions (Signed)
Visit Information  Thank you for taking time to visit with me today. Please don't hesitate to contact me if I can be of assistance to you.   Following are the goals we discussed today:   Goals Addressed             This Visit's Progress    Receive Assistance Obtaining In-Home Care Services.   On track    Care Coordination Interventions:  Interventions Today    Flowsheet Row Most Recent Value  Chronic Disease   Chronic disease during today's visit Chronic Obstructive Pulmonary Disease (COPD), Congestive Heart Failure (CHF), Hypertension (HTN), Chronic Kidney Disease/End Stage Renal Disease (ESRD), Other  [Morbid Obesity, Major Depression, Prediabetes, Caregiver Burnout, Stress & Fatigue, Inability to Perform Activities of Daily Living Independently, Ulcerative Collitus, Memory Deficits, Recent Fall with Injury, Osteopenia, Cervical Spondylosis.]  General Interventions   General Interventions Discussed/Reviewed General Interventions Discussed, General Interventions Reviewed, Communication with, Level of Care, [facility], Doctor Visits  [Encouraged Routine Engagement with Care Team Members & Providers.]  Doctor Visits Discussed/Reviewed Doctor Visits Discussed, Specialist, Doctor Visits Reviewed, Annual Wellness Visits, PCP  [Encouraged Routine Engagement with Care Team Members & Providers.]  PCP/Specialist Visits Compliance with follow-up visit  [Encouraged Routine Engagement with Care Team Members & Providers.]  Contacted provider for referral to PCP, Specialist, Dentist, Ophthalmologist  [Encouraged Routine Engagement with Care Team Members & Providers.]  Communication with PCP/Specialists, RN, Pharmacists, Social Work  [employer] Routine Engagement with Care Team Members & Providers.]  Level of Care Adult Daycare, Assisted Living, Skilled Nursing Facility  [Confirmed Disinterest in Enrollment in Adult Day Care Program or Receiving Assistance Pursuing Higher Level of Care  Placement Options (I.e Assisted Living Versus Skilled Nursing Facility).]  Applications Medicaid, Personal Care Services  [Confirmed Disinterest in Receiving Assistance Applying for Medicaid or Personal Care Services.]  Education Interventions   Education Provided Provided Education  [Thoroughly Reviewed Educational Material to Ensure Understanding & Entertain Questions.]  Provided Verbal Education On Mental Health/Coping with Illness, When to see the doctor, [facility], Applications  [Encouraged Continued Review of Educational Material & Consideration of Implementation.]  Ship broker, Personal Care Services  [Confirmed Disinterest in Receiving Assistance Applying for Medicaid or Personal Care Services.]  Mental Health Interventions   Mental Health Discussed/Reviewed Mental Health Discussed, Anxiety, Depression, Grief and Loss, Mental Health Reviewed, Crisis, Other, Suicide, Coping Strategies, Substance Abuse  [Assessed Mental Health & Cognitive Status. Provided Caregiver Support & Resources.]  Safety Interventions   Safety Discussed/Reviewed Safety Discussed, Safety Reviewed      Active Listening & Reflection Utilized. Verbalization of Feelings Encouraged.  Emotional Support Provided. Cognitive Behavioral Therapy Performed. Acceptance & Commitment Therapy Initiated. CSW Collaboration with Daughter, [name] to Encourage Routine Engagement with [name], Licensed Clinical Social Worker with [facility], [facility] [phone]), if She Has Questions, Needs Assistance, or If Additional Social Work Needs Are Identified Between Now & Our Next Follow-Up Outreach Call, Scheduled on 04/22/2023 at 9:45 AM.      Our next appointment is by telephone on 04/22/2023 at 9:45 am.  Please call the care guide team at [phone] if you need to cancel or reschedule your appointment.   If you are experiencing a Mental Health or Behavioral  Health Crisis or need someone to talk to, please call the Suicide and Crisis Lifeline: 988 call the [place] National Suicide Prevention Lifeline: 410-408-2628 or TTY: (640) 749-5415 TTY 479-681-9502) to talk to a trained counselor call 1-800-273-TALK (toll free, 24  hour hotline) go to [facility] Urgent [facility] [address], [place] [phone]) call the [facility] Crisis Line: [phone] call 911  Patient verbalizes understanding of instructions and care plan provided today and agrees to view in MyChart. Active MyChart status and patient understanding of how to access instructions and care plan via MyChart confirmed with patient.     Telephone follow up appointment with care management team member scheduled for:  04/22/2023 at 9:45 am.   [name], BSW, MSW, LCSW [facility]  [facility], [facility] Clinical Social Worker II Direct Dial: [phone]  Fax: [phone] Website: [email or web address]

## 2023-03-22 ENCOUNTER — Telehealth: Payer: Self-pay

## 2023-03-22 ENCOUNTER — Other Ambulatory Visit: Payer: Self-pay

## 2023-03-22 MED ORDER — CARVEDILOL 12.5 MG PO TABS: 12.5000 mg | ORAL_TABLET | Freq: Two times a day (BID) | ORAL | 1 refills | Status: DC

## 2023-03-22 MED ORDER — TAMSULOSIN HCL 0.4 MG PO CAPS: 0.4000 mg | ORAL_CAPSULE | Freq: Every day | ORAL | 3 refills | Status: DC

## 2023-03-22 NOTE — Telephone Encounter (Signed)
Received Rx request from [facility] for TAMSULOSIN 0.4MG  Capsules Did not see on medication list

## 2023-03-22 NOTE — Telephone Encounter (Signed)
Nurses-this patient is fairly weak at times and is in and out of the hospital.  It is possible that his medication was stopped by another provider. So therefore before we can fill this we need verification from the daughter is he still taking this?  If he is still taking this then it would be okay to give enough refills to last for 6 months Either 90-day with 1 refill or 30-day with 5 refills thank you

## 2023-03-22 NOTE — Telephone Encounter (Signed)
Discussed with pt daughter, pt is still taking medication, medication has been sent in

## 2023-03-23 DIAGNOSIS — J9601 Acute respiratory failure with hypoxia: Secondary | ICD-10-CM | POA: Diagnosis not present

## 2023-03-23 DIAGNOSIS — J441 Chronic obstructive pulmonary disease with (acute) exacerbation: Secondary | ICD-10-CM | POA: Diagnosis not present

## 2023-03-23 DIAGNOSIS — R0902 Hypoxemia: Secondary | ICD-10-CM | POA: Diagnosis not present

## 2023-03-30 ENCOUNTER — Other Ambulatory Visit: Payer: Self-pay | Admitting: Family Medicine

## 2023-04-16 ENCOUNTER — Ambulatory Visit: Payer: Medicare Other | Admitting: Podiatry

## 2023-04-16 DIAGNOSIS — B351 Tinea unguium: Secondary | ICD-10-CM | POA: Diagnosis not present

## 2023-04-16 DIAGNOSIS — M79676 Pain in unspecified toe(s): Secondary | ICD-10-CM | POA: Diagnosis not present

## 2023-04-16 DIAGNOSIS — N1831 Chronic kidney disease, stage 3a: Secondary | ICD-10-CM | POA: Diagnosis not present

## 2023-04-16 NOTE — Progress Notes (Signed)
This patient presents to the office with chief complaint of long thick painful nails.  Patient says the nails are painful walking and wearing shoes.  This patient is unable to self treat.  This patient is unable to trim his nails since he is unable to reach his nails.  he presents to the office for preventative foot care services.  General Appearance  Alert, conversant and in no acute stress.  Vascular  Dorsalis pedis and posterior tibial  pulses are  not palpable due to swelling   bilaterally.  Capillary return is within normal limits  bilaterally. Temperature is within normal limits  bilaterally.  Neurologic  Senn-Weinstein monofilament wire test within normal limits  bilaterally. Muscle power within normal limits bilaterally.  Nails Thick disfigured discolored nails with subungual debris  from hallux to fifth toes bilaterally. No evidence of bacterial infection or drainage bilaterally.  Orthopedic  No limitations of motion  feet .  No crepitus or effusions noted.  No bony pathology or digital deformities noted.  Skin  normotropic skin with no porokeratosis noted bilaterally.  No signs of infections or ulcers noted.     Onychomycosis  Nails  B/L.  Pain in right toes  Pain in left toes  Debridement of nails both feet followed trimming the nails with dremel tool.    RTC 3 months.   Helane Gunther DPM

## 2023-04-20 DIAGNOSIS — J441 Chronic obstructive pulmonary disease with (acute) exacerbation: Secondary | ICD-10-CM | POA: Diagnosis not present

## 2023-04-20 DIAGNOSIS — J9601 Acute respiratory failure with hypoxia: Secondary | ICD-10-CM | POA: Diagnosis not present

## 2023-04-20 DIAGNOSIS — R0902 Hypoxemia: Secondary | ICD-10-CM | POA: Diagnosis not present

## 2023-04-22 ENCOUNTER — Telehealth: Payer: Self-pay | Admitting: Family Medicine

## 2023-04-22 ENCOUNTER — Ambulatory Visit: Payer: Self-pay | Admitting: *Deleted

## 2023-04-22 MED ORDER — HYDRALAZINE HCL 25 MG PO TABS: ORAL_TABLET | ORAL | 5 refills | Status: DC

## 2023-04-22 NOTE — Patient Instructions (Signed)
 Visit Information  Thank you for taking time to visit with me today. Please don't hesitate to contact me if I can be of assistance to you.   Following are the goals we discussed today:   Goals Addressed             This Visit's Progress    COMPLETED: Receive Assistance Obtaining In-Home Care Services.   On track    Care Coordination Interventions:  Interventions Today    Flowsheet Row Most Recent Value  Chronic Disease   Chronic disease during today's visit Chronic Obstructive Pulmonary Disease (COPD), Congestive Heart Failure (CHF), Hypertension (HTN), Chronic Kidney Disease/End Stage Renal Disease (ESRD), Other  [Morbid Obesity, Major Depression, Prediabetes, Caregiver Burnout, Stress & Fatigue, Inability to Perform Activities of Daily Living Independently, Ulcerative Collitus, Memory Deficits, Recent Fall with Injury, Osteopenia, Cervical Spondylosis.]  General Interventions   General Interventions Discussed/Reviewed General Interventions Discussed, General Interventions Reviewed, Communication with, Level of Care, [facility], Doctor Visits  [Encouraged Routine Engagement with Care Team Members & Providers.]  Doctor Visits Discussed/Reviewed Doctor Visits Discussed, Specialist, Doctor Visits Reviewed, Annual Wellness Visits, PCP  [Encouraged Routine Engagement with Care Team Members & Providers.]  PCP/Specialist Visits Compliance with follow-up visit  [Encouraged Routine Engagement with Care Team Members & Providers.]  Contacted provider for referral to PCP, Specialist, Dentist, Ophthalmologist  [Encouraged Routine Engagement with Care Team Members & Providers.]  Communication with PCP/Specialists, RN, Pharmacists, Social Work  [employer] Routine Engagement with Care Team Members & Providers.]  Level of Care Adult Daycare, Assisted Living, Skilled Nursing Facility  [Confirmed Disinterest in Enrollment in Adult Day Care Program or Receiving Assistance Pursuing Higher Level of  Care Placement Options (I.e Assisted Living Versus Skilled Nursing Facility).]  Applications Medicaid, Personal Care Services  [Confirmed Disinterest in Receiving Assistance Applying for Medicaid or Personal Care Services.]  Education Interventions   Education Provided Provided Education  [Thoroughly Reviewed Educational Material to Ensure Understanding & Entertain Questions.]  Provided Verbal Education On Mental Health/Coping with Illness, When to see the doctor, [facility], Applications  [Encouraged Continued Review of Educational Material & Consideration of Implementation.]  Ship broker, Personal Care Services  [Confirmed Disinterest in Receiving Assistance Applying for Medicaid or Personal Care Services.]  Mental Health Interventions   Mental Health Discussed/Reviewed Mental Health Discussed, Anxiety, Depression, Grief and Loss, Mental Health Reviewed, Crisis, Other, Suicide, Coping Strategies, Substance Abuse  [Assessed Mental Health & Cognitive Status. Provided Caregiver Support & Resources.]  Safety Interventions   Safety Discussed/Reviewed Safety Discussed, Safety Reviewed      Active Listening & Reflection Utilized. Verbalization of Feelings Encouraged.  Emotional Support Provided. Cognitive Behavioral Therapy Performed. CSW Collaboration with Daughter, [name] to Encourage Engagement with [name], Licensed Clinical Social Worker with [facility], [facility] [phone]), if She Has Questions, Needs Assistance, Additional Social Work Needs Are Identified in The Near Future, or If She Changes Her Mind About Wanting to [employer] Social Work Services.      Please call the care guide team at [phone] if you need to cancel or reschedule your appointment.   If you are experiencing a Mental Health or Behavioral Health Crisis or need someone to talk to, please call the Suicide and Crisis Lifeline: 988 call the [place]  National Suicide Prevention Lifeline: 332-642-3834 or TTY: 8487651483 TTY (743) 414-7741) to talk to a trained counselor call 1-800-273-TALK (toll free, 24 hour hotline) go to [facility] Urgent Care [address], [place] [phone]) call  the [facility]: [phone] call 911  Patient verbalizes understanding of instructions and care plan provided today and agrees to view in MyChart. Active MyChart status and patient understanding of how to access instructions and care plan via MyChart confirmed with patient.     No further follow up required.  [name], [name], [name], [name] [facility], [facility] Clinical Social Worker II Direct Dial: [phone]  Fax: [phone] Website: [email or web address]

## 2023-04-22 NOTE — Telephone Encounter (Signed)
 Refill on Hydralazine 100 mg send to [facility] [place]

## 2023-04-22 NOTE — Patient Outreach (Signed)
 Care Coordination   Follow Up Visit Note   04/22/2023  Name: [name] MRN: [id number] DOB: [birth year]  [name] is a 83 y.o. year old male who sees Luking, [name], MD for primary care. I spoke with patient's daughter, [name] by phone today.  What matters to the patients health and wellness today? Receive Assistance Obtaining In-Home Care Services.     Goals Addressed             This Visit's Progress    COMPLETED: Receive Assistance Obtaining In-Home Care Services.   On track    Care Coordination Interventions:  Interventions Today    Flowsheet Row Most Recent Value  Chronic Disease   Chronic disease during today's visit Chronic Obstructive Pulmonary Disease (COPD), Congestive Heart Failure (CHF), Hypertension (HTN), Chronic Kidney Disease/End Stage Renal Disease (ESRD), Other  [Morbid Obesity, Major Depression, Prediabetes, Caregiver Burnout, Stress & Fatigue, Inability to Perform Activities of Daily Living Independently, Ulcerative Collitus, Memory Deficits, Recent Fall with Injury, Osteopenia, Cervical Spondylosis.]  General Interventions   General Interventions Discussed/Reviewed General Interventions Discussed, General Interventions Reviewed, Communication with, Level of Care, [facility], Doctor Visits  [Encouraged Routine Engagement with Care Team Members & Providers.]  Doctor Visits Discussed/Reviewed Doctor Visits Discussed, Specialist, Doctor Visits Reviewed, Annual Wellness Visits, PCP  [Encouraged Routine Engagement with Care Team Members & Providers.]  PCP/Specialist Visits Compliance with follow-up visit  [Encouraged Routine Engagement with Care Team Members & Providers.]  Contacted provider for referral to PCP, Specialist, Dentist, Ophthalmologist  [Encouraged Routine Engagement with Care Team Members & Providers.]  Communication with PCP/Specialists, RN, Pharmacists, Social Work  [employer] Routine Engagement with Care Team Members &  Providers.]  Level of Care Adult Daycare, Assisted Living, Skilled Nursing Facility  [Confirmed Disinterest in Enrollment in Adult Day Care Program or Receiving Assistance Pursuing Higher Level of Care Placement Options (I.e Assisted Living Versus Skilled Nursing Facility).]  Applications Medicaid, Personal Care Services  [Confirmed Disinterest in Receiving Assistance Applying for Medicaid or Personal Care Services.]  Education Interventions   Education Provided Provided Education  [Thoroughly Reviewed Educational Material to Ensure Understanding & Entertain Questions.]  Provided Verbal Education On Mental Health/Coping with Illness, When to see the doctor, [facility], Applications  [Encouraged Continued Review of Educational Material & Consideration of Implementation.]  Ship broker, Personal Care Services  [Confirmed Disinterest in Receiving Assistance Applying for Medicaid or Personal Care Services.]  Mental Health Interventions   Mental Health Discussed/Reviewed Mental Health Discussed, Anxiety, Depression, Grief and Loss, Mental Health Reviewed, Crisis, Other, Suicide, Coping Strategies, Substance Abuse  [Assessed Mental Health & Cognitive Status. Provided Caregiver Support & Resources.]  Safety Interventions   Safety Discussed/Reviewed Safety Discussed, Safety Reviewed      Active Listening & Reflection Utilized. Verbalization of Feelings Encouraged.  Emotional Support Provided. Cognitive Behavioral Therapy Performed. CSW Collaboration with Daughter, [name] to Encourage Engagement with Danford Bad, Licensed Clinical Social Worker with [facility], [facility] [phone]), if She Has Questions, Needs Assistance, Additional Social Work Needs Are Identified in The Near Future, or If She Changes Her Mind About Wanting to [employer] Social Work Services.      SDOH assessments and interventions completed:  Yes.  Care  Coordination Interventions:  Yes, provided.   Follow up plan: No further intervention required.   Encounter Outcome:  Patient Visit Completed.   Danford Bad, BSW, MSW, Nurse, mental health Health  [facility], Population Health Clinical Social Worker II Direct Dial:  [phone]  Fax: [phone] Website: [email or web address]

## 2023-05-10 NOTE — Patient Instructions (Signed)
 Visit Information  Thank you for taking time to visit with me today. Please don't hesitate to contact me if I can be of assistance to you.   Following are the goals we discussed today:   Goals Addressed               This Visit's Progress     Patient Stated     COMPLETED: Effective management of CHF/home improvement (pt-stated)        Interventions Today    Flowsheet Row Most Recent Value  Chronic Disease   Chronic disease during today's visit Other, Chronic Obstructive Pulmonary Disease (COPD), Congestive Heart Failure (CHF), Hypertension (HTN), Chronic Kidney Disease/End Stage Renal Disease (ESRD)  [electric bill,]  General Interventions   General Interventions Discussed/Reviewed [facility], General Interventions Reviewed, Communication with  Communication with Social Work  Environmental consultant with BSW L Clemons]  Education Interventions   Education Provided Provided Education  [employer] form ADTS, Meals on wheels,]  Provided Verbal Education On [facility]  Mental Health Interventions   Mental Health Discussed/Reviewed Refer to Social Work for resources, Freight forwarder, Mental Health Reviewed  Refer to Social Work for resources regarding Other  [LEAP- Low energy assistance program]  Nutrition Interventions   Nutrition Discussed/Reviewed Nutrition Reviewed, Adding fruits and vegetables, Decreasing salt, Fluid intake, Decreasing fats  Pharmacy Interventions   Pharmacy Dicussed/Reviewed Pharmacy Topics Reviewed, Affording Medications  Safety Interventions   Safety Discussed/Reviewed Safety Reviewed, Fall Risk, Home Safety  Home Safety Assistive Devices              Our next appointment is no further scheduled appointments.  on n/a at n/a  Please call the care guide team at [phone] if you need to cancel or reschedule your appointment.   If you are experiencing a Mental Health or Behavioral Health Crisis or need someone to talk to, please call the Suicide  and Crisis Lifeline: 988 call the [place] National Suicide Prevention Lifeline: 817-179-6402 or TTY: 930-724-1976 TTY 714 065 4767) to talk to a trained counselor call 1-800-273-TALK (toll free, 24 hour hotline) call the [facility]: [phone] call 911   Patient verbalizes understanding of instructions and care plan provided today and agrees to view in MyChart. Active MyChart status and patient understanding of how to access instructions and care plan via MyChart confirmed with patient.     The patient has been provided with contact information for the care management team and has been advised to call with any health related questions or concerns.   Donja Tipping L. Noelle Penner, RN, BSN, CCM [facility] RN Care Manager [phone]

## 2023-05-17 ENCOUNTER — Other Ambulatory Visit: Payer: Self-pay | Admitting: Family Medicine

## 2023-05-21 DIAGNOSIS — R0902 Hypoxemia: Secondary | ICD-10-CM | POA: Diagnosis not present

## 2023-05-21 DIAGNOSIS — J9601 Acute respiratory failure with hypoxia: Secondary | ICD-10-CM | POA: Diagnosis not present

## 2023-05-21 DIAGNOSIS — J441 Chronic obstructive pulmonary disease with (acute) exacerbation: Secondary | ICD-10-CM | POA: Diagnosis not present

## 2023-06-05 ENCOUNTER — Ambulatory Visit: Payer: Self-pay

## 2023-06-05 NOTE — Telephone Encounter (Signed)
 Information obtained from the daughter [name].  Chief Complaint: foot swelling, weakness, weight loss, dizzy in the morning when he gets up, broken cpap Pertinent Negatives: Patient denies fever, SOB, CP,  Disposition: [] ED /[] Urgent Care (no appt availability in office) / [x] Appointment(In office/virtual)/ []  [facility]/ [] Home Care/ [] Refused Recommended Disposition /[] [facility]/ []  Follow-up with PCP Additional Notes: Pt daughter calling stating that the pt is suffering from dizziness in AM when waking, broken CPAP, feet swelling, weight loss, open wound on back. Pt has wound on back that daughter believes is from when he had a spot froze on his back. Daughter states that pt is not dizzy at time of call. Pt denies SOB, CP. Daughter states that pt does not want to "move", believes that pt is afraid to fall. Daughter feels that pt will does not even want to go to the bathroom. Pt scheduled with PCP tomorrow.   Copied from CRM [id number]. Topic: Clinical - Red Word Triage >> Jun 05, 2023  4:10 PM Baldomero Bone wrote: [facility] that prompted transfer to Nurse Triage: [name], daughter, needs to re-evaluate his medication open wound on back  feet are swelling losing weight dizzy when sitting up patient was throwing up 2 days ago Callback number is [phone] Reason for Disposition  [1] MILD swelling of both ankles (i.e., pedal edema) AND [2] new-onset or worsening  Answer Assessment - Initial Assessment Questions 1. ONSET: "When did the swelling start?" (e.g., minutes, hours, days)     About 2 weeks 2. LOCATION: "What part of the leg is swollen?"  "Are both legs swollen or just one leg?"     BLE, L worse, R is very minimal swelling 3. SEVERITY: "How bad is the swelling?" (e.g., localized; mild, moderate, severe)   - Localized: Small area of swelling localized to one leg.   - MILD pedal edema: Swelling limited to foot and ankle, pitting edema < 1/4 inch (6 mm) deep, rest and  elevation eliminate most or all swelling.   - MODERATE edema: Swelling of lower leg to knee, pitting edema > 1/4 inch (6 mm) deep, rest and elevation only partially reduce swelling.   - SEVERE edema: Swelling extends above knee, facial or hand swelling present.      Mild-moderate 4. REDNESS: "Does the swelling look red or infected?"     denies 5. PAIN: "Is the swelling painful to touch?" If Yes, ask: "How painful is it?"   (Scale 1-10; mild, moderate or severe)     denies 6. FEVER: "Do you have a fever?" If Yes, ask: "What is it, how was it measured, and when did it start?"      denies 7. CAUSE: "What do you think is causing the leg swelling?"     Per daughter/caller, to much sugary drinks and unhealthy foods 8. MEDICAL HISTORY: "Do you have a history of blood clots (e.g., DVT), cancer, heart failure, kidney disease, or liver failure?"     Fatty liver 9. RECURRENT SYMPTOM: "Have you had leg swelling before?" If Yes, ask: "When was the last time?" "What happened that time?"     Yes per daughter has been much worse 10. OTHER SYMPTOMS: "Do you have any other symptoms?" (e.g., chest pain, difficulty breathing)       denies  Answer Assessment - Initial Assessment Questions 1. LOCATION: "Where is the wound located?"      back 2. WOUND APPEARANCE: "What does the wound look like?"  Drainage-pus,  3. SIZE: If redness is present, ask: "What is the size of the red area?" (Inches, centimeters, or compare to size of a coin)      A quarter 4. SPREAD: "What's changed in the last day?"  "Do you see any red streaks coming from the wound?"     denies 5. ONSET: "When did it start to look infected?"      3 days approx 6. MECHANISM: "How did the wound start, what was the cause?"     Believes that the pt had something frozen while at the doctor 7. PAIN: "Is there any pain?" If Yes, ask: "How bad is the pain?"   (Scale 1-10; or mild, moderate, severe) 5 8. FEVER: "Do you have a fever?" If Yes, ask:  "What is your temperature, how was it measured, and when did it start?" 96.4 9. OTHER SYMPTOMS: "Do you have any other symptoms?" (e.g., shaking chills, weakness, rash elsewhere on body)     denies  Protocols used: Wound Infection-A-AH, Leg Swelling and Edema-A-AH

## 2023-06-06 ENCOUNTER — Telehealth: Payer: Self-pay | Admitting: Family Medicine

## 2023-06-06 ENCOUNTER — Encounter: Payer: Self-pay | Admitting: Family Medicine

## 2023-06-06 ENCOUNTER — Ambulatory Visit (INDEPENDENT_AMBULATORY_CARE_PROVIDER_SITE_OTHER): Admitting: Family Medicine

## 2023-06-06 VITALS — BP 126/68 | HR 72 | Temp 98.1°F | Ht 73.0 in | Wt 288.0 lb

## 2023-06-06 DIAGNOSIS — G4733 Obstructive sleep apnea (adult) (pediatric): Secondary | ICD-10-CM | POA: Diagnosis not present

## 2023-06-06 DIAGNOSIS — J449 Chronic obstructive pulmonary disease, unspecified: Secondary | ICD-10-CM | POA: Diagnosis not present

## 2023-06-06 DIAGNOSIS — E785 Hyperlipidemia, unspecified: Secondary | ICD-10-CM | POA: Diagnosis not present

## 2023-06-06 DIAGNOSIS — I10 Essential (primary) hypertension: Secondary | ICD-10-CM | POA: Diagnosis not present

## 2023-06-06 DIAGNOSIS — Z79899 Other long term (current) drug therapy: Secondary | ICD-10-CM | POA: Diagnosis not present

## 2023-06-06 DIAGNOSIS — R7303 Prediabetes: Secondary | ICD-10-CM

## 2023-06-06 DIAGNOSIS — D692 Other nonthrombocytopenic purpura: Secondary | ICD-10-CM | POA: Diagnosis not present

## 2023-06-06 DIAGNOSIS — D509 Iron deficiency anemia, unspecified: Secondary | ICD-10-CM | POA: Diagnosis not present

## 2023-06-06 DIAGNOSIS — N1831 Chronic kidney disease, stage 3a: Secondary | ICD-10-CM | POA: Diagnosis not present

## 2023-06-06 DIAGNOSIS — F321 Major depressive disorder, single episode, moderate: Secondary | ICD-10-CM

## 2023-06-06 DIAGNOSIS — R238 Other skin changes: Secondary | ICD-10-CM

## 2023-06-06 DIAGNOSIS — E559 Vitamin D deficiency, unspecified: Secondary | ICD-10-CM | POA: Diagnosis not present

## 2023-06-06 MED ORDER — ISOSORBIDE MONONITRATE ER 30 MG PO TB24: ORAL_TABLET | ORAL | 1 refills | Status: DC

## 2023-06-06 MED ORDER — PRAVASTATIN SODIUM 40 MG PO TABS: ORAL_TABLET | ORAL | 3 refills | Status: DC

## 2023-06-06 MED ORDER — TORSEMIDE 20 MG PO TABS: ORAL_TABLET | ORAL | 1 refills | Status: DC

## 2023-06-06 MED ORDER — SPIRONOLACTONE 25 MG PO TABS: 12.5000 mg | ORAL_TABLET | Freq: Every day | ORAL | 1 refills | Status: DC

## 2023-06-06 MED ORDER — TAMSULOSIN HCL 0.4 MG PO CAPS: 0.4000 mg | ORAL_CAPSULE | Freq: Every day | ORAL | 5 refills | Status: DC

## 2023-06-06 NOTE — Telephone Encounter (Signed)
 RN called pt and LVM with callback number advising pt, per CAL, that if the pt is more than 10 minutes late to his appt it will have to be rescheduled.  Copied from CRM [id number]. Topic: General - Running Late >> Jun 06, 2023  3:20 PM [name] S wrote: Patient/patient representative is calling because they are running late for an appointment. Will be there about 3:45 ran into traffic

## 2023-06-06 NOTE — Progress Notes (Signed)
 Subjective:    Patient ID: [name], male    DOB: [birth year], 83 y.o.   MRN: [id number]  HPI Place on back is covered with gauze  Feet are tired and heavy  Cpap is broken - can't hold water  Vomiting this morning -  Patient denies any nausea vomiting or abdominal pain currently Discussed the use of AI scribe software for clinical note transcription with the patient, who gave verbal consent to proceed.  History of Present Illness   [name] is an 83 year old male who presents with a persistent skin lesion on his back.  He has a persistent skin lesion on his upper back, initially treated with cryotherapy three months ago. The lesion was subsequently treated with an acid lotion, leading to an infection. It causes itching, particularly at night, prompting frequent scratching. His daughter applies iodine and covers it with a Band-Aid, but he has run out of Bactroban  ointment, which was previously prescribed.  He experiences issues with his breathing due to a malfunctioning BiPAP machine, which he feels is not blowing air properly. This affects his sleep, as he wakes up every four hours to refill the water in the machine.  He reports frequent urination and yellow bowel movements. His daughter notes occasional blood in his stool, possibly related to hemorrhoids or diverticulosis.  He has experienced weight loss, which his daughter attributes to a disrupted eating schedule. He stays up late and wakes up late, with a reduced appetite and sometimes finds food unappealing.  His daughter is concerned about his hygiene, noting that he sometimes soils himself and is reluctant to get up to use the bathroom, raising concerns about potential skin sores from prolonged exposure to urine and feces.  He has new hearing aids, which have improved his hearing, though his daughter still needs to repeat herself often, possibly due to his breathing pattern affecting his comprehension.  He is  currently taking torsemide , one pill in the morning, and has not needed to increase the dose. He has a large supply of the medication and prefers not to order more until necessary.       Review of Systems     Objective:   Physical Exam  General-in no acute distress Eyes-no discharge Lungs-respiratory rate normal, CTA CV-no murmurs,RRR Extremities skin warm dry no edema Neuro grossly normal Behavior normal, alert       Assessment & Plan:   1. Stage 3a chronic kidney disease (CKD) ([name]) (Primary) I am concerned about his kidneys healthy diet continuing medication check lab work is the best path blood pressure under good control currently diet is reasonable - Basic Metabolic Panel  2. Essential hypertension Blood pressure doing well continue current measures check lab work - Magnesium   3. Senile purpura ([name]) This is related to age not severe I doubt any type of underlying CBC issue  4. Iron deficiency anemia, unspecified iron deficiency anemia type We will check a hemoglobin because of his history and fatigue - CBC with Differential  5. Hyperlipidemia, unspecified hyperlipidemia type Will check lipid profile healthy diet continue all medications - Lipid Panel  6. Skin breakdown Proper care for this in the upper back is noted should get better with the Bactroban   7. Morbid obesity ([name]) Portion control regular physical activity  8. Chronic obstructive pulmonary disease, unspecified COPD type ([name]) Patient does have sleep apnea and needs a new CPAP machine Also needs oxygen  periodically we will have his home health people try to  look into this issue-our nurses will connect with [facility]  9. Obstructive sleep apnea CPAP needed.  See discussion above patient does have sleep apnea does benefit from treatment states his current machine is broken needs supplies as well  10. Vitamin D  deficiency History of vitamin D  deficiency does not get outside much does take a  multivitamin check labs - Vitamin D , 25-hydroxy  11. Depression, major, single episode, moderate ([name]) Takes his medication.  Patient is depressed because he is not able to do much but this is really a byproduct of him getting older and debilitated but we are trying to keep him going as best as possible  12. Prediabetes Check A1c healthy diet recommended - Hemoglobin A1c  13. High risk medication use Check labs continue current meds - Hepatic Function Panel Follow-up as planned again in the summertime  Assessment and Plan    High risk pregnancy High-risk pregnancy with emotional stress and dietary challenges. Adjusted sertraline  for depression symptoms. - Adjust sertraline  to 100 mg daily. - Coordinate with high-risk obstetrician regarding medication adjustments. - Plan follow-up in three weeks, in-person or via video. - Monitor dietary intake and ensure adequate hydration.  Depression Depression managed   Weight loss Weight loss due to decreased appetite and irregular eating schedule. - Encourage regular meal schedule and adequate nutrition. - Monitor weight and dietary intake.  Sleep apnea BiPAP machine malfunctioning, affecting sleep quality. - Contact [facility] to authorize service visit for BiPAP machine. - Ensure necessary supplies for BiPAP.  Urinary incontinence Urinary incontinence causing hygiene challenges and caregiver burden. - Encourage scheduled bathroom visits. - Consider using a kitchen timer for reminders.  Edema Edema managed with torsemide . Well-controlled with current dosage. - Continue torsemide  1 pill daily. - Monitor for increased swelling and adjust dosage if necessary.  Skin infection Skin infection at site previously treated with cryotherapy. Needs proper wound care. - Prescribe Bactroban  ointment. - Instruct on proper wound care and bandage application.  Hearing loss New hearing aids improving hearing difficulties. - Monitor hearing aid  effectiveness and adjust as needed.

## 2023-06-07 ENCOUNTER — Telehealth: Payer: Self-pay

## 2023-06-07 LAB — CBC WITH DIFFERENTIAL/PLATELET
Basophils Absolute: 0.1 10*3/uL (ref 0.0–0.2)
Basos: 1 %
EOS (ABSOLUTE): 0.4 10*3/uL (ref 0.0–0.4)
Eos: 2 %
Hematocrit: 40.2 % (ref 37.5–51.0)
Hemoglobin: 13.1 g/dL (ref 13.0–17.7)
Immature Grans (Abs): 0.1 10*3/uL (ref 0.0–0.1)
Immature Granulocytes: 1 %
Lymphocytes Absolute: 5.5 10*3/uL — ABNORMAL HIGH (ref 0.7–3.1)
Lymphs: 36 %
MCH: 29.6 pg (ref 26.6–33.0)
MCHC: 32.6 g/dL (ref 31.5–35.7)
MCV: 91 fL (ref 79–97)
Monocytes Absolute: 1.2 10*3/uL — ABNORMAL HIGH (ref 0.1–0.9)
Monocytes: 8 %
Neutrophils Absolute: 7.8 10*3/uL — ABNORMAL HIGH (ref 1.4–7.0)
Neutrophils: 52 %
Platelets: 279 10*3/uL (ref 150–450)
RBC: 4.42 x10E6/uL (ref 4.14–5.80)
RDW: 13.4 % (ref 11.6–15.4)
WBC: 15.1 10*3/uL — ABNORMAL HIGH (ref 3.4–10.8)

## 2023-06-07 LAB — BASIC METABOLIC PANEL WITH GFR
BUN/Creatinine Ratio: 22 (ref 10–24)
BUN: 37 mg/dL — ABNORMAL HIGH (ref 8–27)
CO2: 22 mmol/L (ref 20–29)
Calcium: 11 mg/dL — ABNORMAL HIGH (ref 8.6–10.2)
Chloride: 103 mmol/L (ref 96–106)
Creatinine, Ser: 1.71 mg/dL — ABNORMAL HIGH (ref 0.76–1.27)
Glucose: 116 mg/dL — ABNORMAL HIGH (ref 70–99)
Potassium: 4.8 mmol/L (ref 3.5–5.2)
Sodium: 139 mmol/L (ref 134–144)
eGFR: 39 mL/min/{1.73_m2} — ABNORMAL LOW (ref >59)

## 2023-06-07 LAB — LIPID PANEL
Chol/HDL Ratio: 3.1 ratio (ref 0.0–5.0)
Cholesterol, Total: 143 mg/dL (ref 100–199)
HDL: 46 mg/dL (ref >39)
LDL Chol Calc (NIH): 76 mg/dL (ref 0–99)
Triglycerides: 118 mg/dL (ref 0–149)
VLDL Cholesterol Cal: 21 mg/dL (ref 5–40)

## 2023-06-07 LAB — HEPATIC FUNCTION PANEL
ALT: 17 IU/L (ref 0–44)
AST: 15 IU/L (ref 0–40)
Albumin: 4 g/dL (ref 3.7–4.7)
Alkaline Phosphatase: 94 IU/L (ref 44–121)
Bilirubin Total: 0.3 mg/dL (ref 0.0–1.2)
Bilirubin, Direct: 0.14 mg/dL (ref 0.00–0.40)
Total Protein: 7.3 g/dL (ref 6.0–8.5)

## 2023-06-07 LAB — MAGNESIUM: Magnesium: 2.1 mg/dL (ref 1.6–2.3)

## 2023-06-07 LAB — VITAMIN D 25 HYDROXY (VIT D DEFICIENCY, FRACTURES): Vit D, 25-Hydroxy: 50.6 ng/mL (ref 30.0–100.0)

## 2023-06-07 LAB — HEMOGLOBIN A1C
Est. average glucose Bld gHb Est-mCnc: 134 mg/dL
Hgb A1c MFr Bld: 6.3 % — ABNORMAL HIGH (ref 4.8–5.6)

## 2023-06-07 NOTE — Telephone Encounter (Signed)
 Called and spoke with [facility] staff regarding need for Bipap machine replacement, she states pt is not yet eligible for a new machine due to recently having received it, she states a tech will be getting in contact with the family to get the machine serviced as well as portable oxygen  , family has been made aware.

## 2023-06-09 ENCOUNTER — Encounter: Payer: Self-pay | Admitting: Family Medicine

## 2023-06-13 ENCOUNTER — Other Ambulatory Visit: Payer: Self-pay

## 2023-06-13 DIAGNOSIS — D72829 Elevated white blood cell count, unspecified: Secondary | ICD-10-CM

## 2023-06-20 DIAGNOSIS — J441 Chronic obstructive pulmonary disease with (acute) exacerbation: Secondary | ICD-10-CM | POA: Diagnosis not present

## 2023-06-20 DIAGNOSIS — J9601 Acute respiratory failure with hypoxia: Secondary | ICD-10-CM | POA: Diagnosis not present

## 2023-06-20 DIAGNOSIS — R0902 Hypoxemia: Secondary | ICD-10-CM | POA: Diagnosis not present

## 2023-06-24 DIAGNOSIS — J449 Chronic obstructive pulmonary disease, unspecified: Secondary | ICD-10-CM | POA: Diagnosis not present

## 2023-07-12 ENCOUNTER — Ambulatory Visit

## 2023-07-12 VITALS — Ht 73.0 in | Wt 288.0 lb

## 2023-07-12 DIAGNOSIS — Z0000 Encounter for general adult medical examination without abnormal findings: Secondary | ICD-10-CM | POA: Diagnosis not present

## 2023-07-12 NOTE — Patient Instructions (Signed)
 [name] , Thank you for taking time out of your busy schedule to complete your Annual Wellness Visit with me. I enjoyed our conversation and look forward to speaking with you again next year. I, as well as your care team,  appreciate your ongoing commitment to your health goals. Please review the following plan we discussed and let me know if I can assist you in the future. Your Game plan/ To Do List    Follow up Visits: Next Medicare AWV with our clinical staff: In 1 year    Have you seen your provider in the last 6 months (3 months if uncontrolled diabetes)? Yes Next Office Visit with your provider: 10/10/23 @ 2:50  Clinician Recommendations:  Aim for 30 minutes of exercise or brisk walking, 6-8 glasses of water, and 5 servings of fruits and vegetables each day.       This is a list of the screening recommended for you and due dates:  Health Maintenance  Topic Date Due   Zoster (Shingles) Vaccine (2 of 2) 03/16/2019   DTaP/Tdap/Td vaccine (2 - Tdap) 07/06/2019   COVID-19 Vaccine (3 - Pfizer risk series) 08/02/2019   Flu Shot  09/06/2023   Medicare Annual Wellness Visit  07/11/2024   Pneumonia Vaccine  Completed   HPV Vaccine  Aged Out   Meningitis B Vaccine  Aged Out    Advanced directives: (In Chart) A copy of your advanced directives are scanned into your chart should your provider ever need it.  Advance Care Planning is important because it:  [x]  Makes sure you receive the medical care that is consistent with your values, goals, and preferences  [x]  It provides guidance to your family and loved ones and reduces their decisional burden about whether or not they are making the right decisions based on your wishes.  Follow the link provided in your after visit summary or read over the paperwork we have mailed to you to help you started getting your Advance Directives in place. If you need assistance in completing these, please reach out to us  so that we can help you!  See  attachments for Preventive Care and Fall Prevention Tips.

## 2023-07-12 NOTE — Progress Notes (Signed)
 Subjective:   [name] is a 83 y.o. who presents for a Medicare Wellness preventive visit.  As a reminder, Annual Wellness Visits don't include a physical exam, and some assessments may be limited, especially if this visit is performed virtually. We may recommend an in-person follow-up visit with your provider if needed.  Visit Complete: Virtual I connected with  [name] on 07/12/23 by a audio enabled telemedicine application and verified that I am speaking with the correct person using two identifiers.  Patient Location: Home  Provider Location: Home Office  I discussed the limitations of evaluation and management by telemedicine. The patient expressed understanding and agreed to proceed.  Vital Signs: Because this visit was a virtual/telehealth visit, some criteria may be missing or patient reported. Any vitals not documented were not able to be obtained and vitals that have been documented are patient reported.  VideoDeclined- This patient declined Librarian, academic. Therefore the visit was completed with audio only.  Persons Participating in Visit: Patient assisted by daughter [name] .  AWV Questionnaire: No: Patient Medicare AWV questionnaire was not completed prior to this visit.  Cardiac Risk Factors include: advanced age (>77men, >61 women);dyslipidemia;hypertension;male gender;sedentary lifestyle     Objective:     Today's Vitals   07/12/23 1506  Weight: 288 lb (130.6 kg)  Height: 6\' 1"  (1.854 m)   Body mass index is 38 kg/m.     07/12/2023    3:13 PM 02/26/2023   10:25 AM 12/24/2022   10:56 AM 12/03/2022    3:00 PM 02/20/2022   10:57 AM 02/13/2022    4:21 PM 01/20/2022   10:22 AM  Advanced Directives  Does Patient Have a Medical Advance Directive? Yes No No No No No Yes  Type of Estate agent of [place];Living will      Healthcare Power of Sp[place];Living will  Does patient want to make changes  to medical advance directive? No - Patient declined        Copy of Healthcare Power of Attorney in Chart? Yes - validated most recent copy scanned in chart (See row information)        Would patient like information on creating a medical advance directive?  No - Patient declined No - Patient declined No - Patient declined No - Patient declined No - Patient declined     Current Medications (verified) Outpatient Encounter Medications as of 07/12/2023  Medication Sig   clobetasol (TEMOVATE) 0.05 % external solution    acetaminophen  (TYLENOL ) 500 MG tablet Take 2 tablets (1,000 mg total) by mouth every 6 (six) hours as needed for headache (pain).   albuterol  (VENTOLIN  HFA) 108 (90 Base) MCG/ACT inhaler Inhale 1-2 puffs into the lungs every 6 (six) hours as needed for wheezing or shortness of breath.   amLODipine  (NORVASC ) 5 MG tablet TAKE 1 TABLET BY MOUTH DAILY   ammonium lactate  (AMLACTIN DAILY) 12 % lotion Apply 1 Application topically as needed for dry skin.   aspirin  EC 81 MG tablet Take 81 mg by mouth daily. Swallow whole.   carvedilol  (COREG ) 12.5 MG tablet Take 1 tablet (12.5 mg total) by mouth 2 (two) times daily with a meal.   DULoxetine  (CYMBALTA ) 60 MG capsule TAKE 1 CAPSULE BY MOUTH DAILY   hydrALAZINE  (APRESOLINE ) 25 MG tablet 25 mg one bid (Patient taking differently: Take 25 mg by mouth 2 (two) times daily. 25 mg one bid)   isosorbide  mononitrate (IMDUR ) 30 MG 24 hr tablet  TAKE 2 TABLETS(60 MG) BY MOUTH DAILY   mupirocin  ointment (BACTROBAN ) 2 % Apply thin amount bid prn   nitroGLYCERIN  (NITROSTAT ) 0.4 MG SL tablet Place 1 tablet (0.4 mg total) under the tongue every 5 (five) minutes as needed for chest pain.   OVER THE COUNTER MEDICATION Oxygen  4 LPM   pravastatin  (PRAVACHOL ) 40 MG tablet 1 qd   spironolactone  (ALDACTONE ) 25 MG tablet Take 0.5 tablets (12.5 mg total) by mouth daily.   tamsulosin  (FLOMAX ) 0.4 MG CAPS capsule Take 1 capsule (0.4 mg total) by mouth daily.   torsemide   (DEMADEX ) 20 MG tablet 1 qam and may take 2 pill every day prn if weight gain greater than 3 pounds in a week   Vitamin D , Ergocalciferol , (DRISDOL ) 1.25 MG (50000 UNIT) CAPS capsule TAKE 1 CAPSULE BY MOUTH EVERY 7 DAYS   No facility-administered encounter medications on file as of 07/12/2023.    Allergies (verified) Patient has no known allergies.   History: Past Medical History:  Diagnosis Date   Ankylosing spondylitis ([name]) 11/07/2018   Asthma    BPH (benign prostatic hyperplasia)    CAD (coronary artery disease) 01/1995   CHF (congestive heart failure) ([name])    Clostridium difficile colitis 04/2005   Colitis 2011   COPD (chronic obstructive pulmonary disease) ([name])    Depression    Diverticulosis    DJD (degenerative joint disease)    Fracture, [place], left, closed 11/29/2016   Gastric ulcer 04/17/2010   Three 5mm gastric ulcers, H.pylori serologies were negative   GERD (gastroesophageal reflux disease)    History of kidney stones    Hyperlipidemia    Hypertension    Idiopathic chronic inflammatory bowel disease 05/18/2010   left-sided UC   Kidney stone    Morbid obesity ([name]) 03/12/2018   Obstructive sleep apnea    on Cpap   Reflux 02/1995   S/P endoscopy 07/24/2010   retained gastric contents, benign bx   Past Surgical History:  Procedure Laterality Date   ANORECTAL MANOMETRY  2016   [facility]: concern for possible fissure. Noted pelvic floor dyssnyergy   BIOPSY  07/29/2017   Procedure: BIOPSY;  Surgeon: Suzette Espy, MD;  Location: [facility];  Service: Endoscopy;;  ascending and sigmoid colon   CARDIAC CATHETERIZATION     with stent   CARDIOVASCULAR STRESS TEST  07/21/2009   No scintigraphic evidence of inducible myocardial ischemia   CARPAL TUNNEL RELEASE Left 01/17/2017   Procedure: LEFT CARPAL TUNNEL RELEASE;  Surgeon: Darrin Emerald, MD;  Location: [facility];  Service: Orthopedics;  Laterality: Left;   CATARACT EXTRACTION, BILATERAL Bilateral     CERVICAL SPINE SURGERY     C4-5   COLONOSCOPY  04/2005   granularity and friability erosions from rectum to 40cm. Bx infection vs IBD. C. Diff positive at the time.    COLONOSCOPY  05/2010   [facility]: left-sided UC, bx with no dysplasia, shallow diverticula   COLONOSCOPY [name] 11/07/2012   [id number] proctocolitis status post segmental biopsy/Sigmoid colon polyps removed as described above. Procedure compromisd by technical difficulties. bx: Inflammation limited to sigmoid and rectum on pathology.   COLONOSCOPY  04/2014   Dr. Marrie Sizer at [facility]: well localized proctocolitis limited to sigmoid   COLONOSCOPY WITH PROPOFOL  [name] 07/29/2017   diverticulosis in colon, three 4-6 mm polyps at splenic flexure and in cecum, one 10 mm polyp in rectum, abnormal rectum and sigmoid consistent with active UC   CORONARY STENT PLACEMENT  01/1995   ESOPHAGEAL DILATION  01/20/2022   Procedure: ESOPHAGEAL DILATION;  Surgeon: Felecia Hopper, MD;  Location: [facility];  Service: Gastroenterology;;   ESOPHAGOGASTRODUODENOSCOPY  07/24/2010   RUE:AVWUJW esophagus   ESOPHAGOGASTRODUODENOSCOPY  04/2014   Dr. [facility]rie [facility]izer at [facility]: negative small bowel biopsies   ESOPHAGOGASTRODUODENOSCOPY (EGD) WITH PROPOFOL  [name] 01/20/2022   Procedure: ESOPHAGOGASTRODUODENOSCOPY (EGD) WITH PROPOFOL ;  Surgeon: Felecia Hopper, MD;  Location: [facility];  Service: Gastroenterology;  Laterality: [name];   FOOT SURGERY Bilateral two   KNEE SURGERY  two   POLYPECTOMY  07/29/2017   Procedure: POLYPECTOMY;  Surgeon: Suzette Espy, MD;  [facility];  Service: Endoscopy;;  splenic flexure, ascending colon polyp;rectal   SHOULDER SURGERY  two   TONSILLECTOMY     TRANSTHORACIC ECHOCARDIOGRAM  03/23/2009   EF 60-65%, normal LV systolic function   ULNAR HEAD EXCISION Left 01/17/2017   Procedure: LEFT ULNAR HEAD RESECTION;  Surgeon: Darrin Emerald, MD;  Location: [facility];  Service: Orthopedics;  Laterality: Left;   Family History   Problem Relation Age of Onset   Lung cancer Mother    Cancer Mother        breast   Diabetes Mother    Stroke Father    Hypertension Father    Hyperlipidemia Father    Kidney failure Brother    Other Child        blood infection   Colon cancer Neg Hx    Social History   Socioeconomic History   Marital status: Single    Spouse name: Not on file   Number of children: 2   Years of education: 3   Highest education level: 3rd grade  Occupational History   Occupation: maintenance    Comment: retired  Tobacco Use   Smoking status: Former    Current packs/day: 0.00    Average packs/day: 1 pack/day for 20.0 years (20.0 ttl pk-yrs)    Types: Cigarettes    Start date: 09/30/1949    Quit date: 09/30/1969    Years since quitting: 53.8    Passive exposure: Past   Smokeless tobacco: Never   Tobacco comments:    Verified by Daughter, Dionicio Fray.  Vaping Use   Vaping status: Never Used  Substance and Sexual Activity   Alcohol use: No   Drug use: No   Sexual activity: Not Currently    Partners: Female    Birth control/protection: None    Comment: spouse  Other Topics Concern   Not on file  Social History Narrative   Not on file   Social Drivers of Health   Financial Resource Strain: Low Risk  (07/12/2023)   Overall Financial Resource Strain (CARDIA)    Difficulty of Paying Living Expenses: Not very hard  Food Insecurity: No Food Insecurity (07/12/2023)   Hunger Vital Sign    Worried About Running Out of Food in the Last Year: Never true    Ran Out of Food in the Last Year: Never true  Transportation Needs: No Transportation Needs (07/12/2023)   PRAPARE - Administrator, Civil Service (Medical): No    Lack of Transportation (Non-Medical): No  Physical Activity: Inactive (07/12/2023)   Exercise Vital Sign    Days of Exercise per Week: 0 days    Minutes of Exercise per Session: 0 min  Stress: No Stress Concern Present (07/12/2023)   [employer] of  Occupational Health - Occupational Stress Questionnaire    Feeling of Stress : Not at all  Social Connections: Moderately Isolated (07/12/2023)  Social Advertising account executive [NHANES]    Frequency of Communication with Friends and Family: More than three times a week    Frequency of Social Gatherings with Friends and Family: More than three times a week    Attends Religious Services: More than 4 times per year    Active Member of [employer] or Organizations: No    Attends Banker Meetings: Never    Marital Status: Widowed    Tobacco Counseling Counseling given: Not Answered Tobacco comments: Verified by Daughter, Dionicio Fray.    Clinical Intake:  Pre-visit preparation completed: Yes  Pain : No/denies pain  Diabetes: No  Lab Results  Component Value Date   HGBA1C 6.3 (H) 06/06/2023   HGBA1C 6.0 (H) 09/10/2022   HGBA1C 6.3 (H) 07/11/2021     How often do you need to have someone help you when you read instructions, pamphlets, or other written materials from your doctor or pharmacy?: 1 - Never  Interpreter Needed?: No  Information entered by :: Seabron Cypress LPN   Activities of Daily Living     07/12/2023    3:06 PM 03/19/2023    9:08 AM  In your present state of health, do you have any difficulty performing the following activities:  Hearing? 1 1  Comment  Hard of Hearing.  Vision? 0 0  Difficulty concentrating or making decisions? 0 1  Comment  Cognitive Impairment.  Walking or climbing stairs? 1 1  Comment  Cognitive Impairment, Unsteady Balance/Gait.  Dressing or bathing? 0 1  Comment  Cognitive Impairment, Unsteady Balance/Gait.  Doing errands, shopping? 1 1  Comment  Cognitive Impairment, Unsteady Balance/Gait.  Preparing Food and eating ? N Y  Comment  Cognitive Impairment, Unsteady Balance/Gait.  Using the Toilet? N Y  Comment  Cognitive Impairment, Unsteady Balance/Gait.  In the past six months, have you accidently leaked urine? Y Y   Comment  Cognitive Impairment, Unsteady Balance/Gait.  Do you have problems with loss of bowel control? Y Y  Comment  Cognitive Impairment, Unsteady Balance/Gait.  Managing your Medications? Y Y  Comment  Cognitive Impairment, Unsteady Balance/Gait.  Managing your Finances? Y Y  Comment  Cognitive Impairment, Unsteady Balance/Gait.  Housekeeping or managing your Housekeeping? Y Y  Comment  Cognitive Impairment, Unsteady Balance/Gait.    Patient Care Team: Bennet Brasil, MD as PCP - General (Family Medicine) Avanell Leigh, MD as PCP - Cardiology (Cardiology) Riley Cheadle Windsor Hatcher, MD as Consulting Physician (Gastroenterology) Cleophas Dadds, [facility] (Inactive) (Pharmacist) Royal Cordon, MD as Referring Physician (Dermatology) Ruffin Cotton, DPM as Consulting Physician (Podiatry)  I have updated your Care Teams any recent Medical Services you may have received from other providers in the past year.     Assessment:    This is a routine wellness examination for [place].  Hearing/Vision screen Hearing Screening - Comments:: Hard of hearing; wears hearing aids  Vision Screening - Comments:: up to date with routine eye exams     Goals Addressed             This Visit's Progress    Increase physical activity   Not on track      Depression Screen     07/12/2023    3:12 PM 06/06/2023    4:34 PM 03/19/2023    9:05 AM 02/12/2023    3:14 PM 12/24/2022   10:52 AM 12/21/2022    3:29 PM 09/10/2022    3:45 PM  PHQ 2/9 Scores  PHQ -  2 Score 5 5 0 2 2 3 1   PHQ- 9 Score 17 18 3 13 8 17 11     Fall Risk     07/12/2023    3:14 PM 06/06/2023    4:35 PM 03/19/2023    9:01 AM 02/26/2023   10:25 AM 02/12/2023    3:15 PM  Fall Risk   Falls in the past year? 1 1 1 1 1   Number falls in past yr: 1 1 1 1 1   Injury with Fall? 0 0 1 1 1   Risk for fall due to : History of fall(s);Impaired balance/gait;Impaired mobility History of fall(s) History of fall(s);Impaired vision;Impaired  balance/gait;Medication side effect;Impaired mobility;Mental status change History of fall(s);Impaired balance/gait;Impaired mobility;Mental status change;Medication side effect;Impaired vision History of fall(s);Impaired balance/gait;Impaired mobility  Follow up Education provided;Falls prevention discussed;Falls evaluation completed Falls evaluation completed Falls evaluation completed;Education provided;Falls prevention discussed Falls evaluation completed;Education provided;Falls prevention discussed Falls evaluation completed    MEDICARE RISK AT HOME:  Medicare Risk at Home Any stairs in or around the home?: No If so, are there any without handrails?: No Home free of loose throw rugs in walkways, pet beds, electrical cords, etc?: Yes Adequate lighting in your home to reduce risk of falls?: Yes Life alert?: No Use of a cane, walker or w/c?: Yes Grab bars in the bathroom?: Yes Shower chair or bench in shower?: No Elevated toilet seat or a handicapped toilet?: Yes  TIMED UP AND GO:  Was the test performed?  No  Cognitive Function: Declined/Normal: No cognitive concerns noted by patient or family. Patient alert, oriented, able to answer questions appropriately and recall recent events. No signs of memory loss or confusion.        01/31/2021    1:17 PM  6CIT Screen  What Year? 0 points  What month? 0 points  What time? 0 points  Count back from 20 2 points  Months in reverse 4 points  Repeat phrase 6 points  Total Score 12 points    Immunizations Immunization History  Administered Date(s) Administered   Fluad Quad(high Dose 65+) 11/10/2020, 12/21/2021   Fluad Trivalent(High Dose 65+) 12/21/2022   Influenza Split 11/18/2012   Influenza, High Dose Seasonal PF 11/06/2019   Influenza,inj,Quad PF,6+ Mos 12/03/2013, 11/18/2014, 10/15/2016, 11/05/2017   Influenza-Unspecified 11/17/2015   PFIZER(Purple Top)SARS-COV-2 Vaccination 03/21/2019, 07/05/2019   PNEUMOCOCCAL CONJUGATE-20  12/21/2021   Pneumococcal Conjugate-13 11/18/2014   Pneumococcal Polysaccharide-23 11/06/2018   Pneumococcal-Unspecified 07/05/2009   Td 07/05/2009   Zoster Recombinant(Shingrix ) 01/19/2019    Screening Tests Health Maintenance  Topic Date Due   Zoster Vaccines- Shingrix  (2 of 2) 03/16/2019   DTaP/Tdap/Td (2 - Tdap) 07/06/2019   COVID-19 Vaccine (3 - Pfizer risk series) 08/02/2019   INFLUENZA VACCINE  09/06/2023   Medicare Annual Wellness (AWV)  07/11/2024   Pneumonia Vaccine 32+ Years old  Completed   HPV VACCINES  Aged Out   Meningococcal B Vaccine  Aged Out    Health Maintenance  Health Maintenance Due  Topic Date Due   Zoster Vaccines- Shingrix  (2 of 2) 03/16/2019   DTaP/Tdap/Td (2 - Tdap) 07/06/2019   COVID-19 Vaccine (3 - Pfizer risk series) 08/02/2019    Additional Screening:  Vision Screening: Recommended annual ophthalmology exams for early detection of glaucoma and other disorders of the eye. Would you like a referral to an eye doctor? No    Dental Screening: Recommended annual dental exams for proper oral hygiene  Community Resource Referral / Chronic Care Management: CRR required  this visit?  No   CCM required this visit?  No   Plan:    I have personally reviewed and noted the following in the patient's chart:   Medical and social history Use of alcohol, tobacco or illicit drugs  Current medications and supplements including opioid prescriptions. Patient is not currently taking opioid prescriptions. Functional ability and status Nutritional status Physical activity Advanced directives List of other physicians Hospitalizations, surgeries, and ER visits in previous 12 months Vitals Screenings to include cognitive, depression, and falls Referrals and appointments  In addition, I have reviewed and discussed with patient certain preventive protocols, quality metrics, and best practice recommendations. A written personalized care plan for preventive  services as well as general preventive health recommendations were provided to patient.   Seabron Cypress [place], [place]   10/12/2950   After Visit Summary: (MyChart) Due to this being a telephonic visit, the after visit summary with patients personalized plan was offered to patient via MyChart   Notes: Nothing significant to report at this time.

## 2023-07-18 DIAGNOSIS — I509 Heart failure, unspecified: Secondary | ICD-10-CM | POA: Diagnosis not present

## 2023-07-18 DIAGNOSIS — M6281 Muscle weakness (generalized): Secondary | ICD-10-CM | POA: Diagnosis not present

## 2023-07-18 DIAGNOSIS — I2510 Atherosclerotic heart disease of native coronary artery without angina pectoris: Secondary | ICD-10-CM | POA: Diagnosis not present

## 2023-07-21 DIAGNOSIS — R0902 Hypoxemia: Secondary | ICD-10-CM | POA: Diagnosis not present

## 2023-07-21 DIAGNOSIS — J441 Chronic obstructive pulmonary disease with (acute) exacerbation: Secondary | ICD-10-CM | POA: Diagnosis not present

## 2023-07-21 DIAGNOSIS — J9601 Acute respiratory failure with hypoxia: Secondary | ICD-10-CM | POA: Diagnosis not present

## 2023-07-24 ENCOUNTER — Ambulatory Visit: Admitting: Podiatry

## 2023-07-24 ENCOUNTER — Encounter: Payer: Self-pay | Admitting: Podiatry

## 2023-07-24 DIAGNOSIS — B351 Tinea unguium: Secondary | ICD-10-CM

## 2023-07-24 DIAGNOSIS — M79676 Pain in unspecified toe(s): Secondary | ICD-10-CM

## 2023-07-24 DIAGNOSIS — N1831 Chronic kidney disease, stage 3a: Secondary | ICD-10-CM

## 2023-07-24 NOTE — Progress Notes (Signed)
This patient presents to the office with chief complaint of long thick painful nails.  Patient says the nails are painful walking and wearing shoes.  This patient is unable to self treat.  This patient is unable to trim his nails since he is unable to reach his nails.  he presents to the office for preventative foot care services.  General Appearance  Alert, conversant and in no acute stress.  Vascular  Dorsalis pedis and posterior tibial  pulses are  not palpable due to swelling   bilaterally.  Capillary return is within normal limits  bilaterally. Temperature is within normal limits  bilaterally.  Neurologic  Senn-Weinstein monofilament wire test within normal limits  bilaterally. Muscle power within normal limits bilaterally.  Nails Thick disfigured discolored nails with subungual debris  from hallux to fifth toes bilaterally. No evidence of bacterial infection or drainage bilaterally.  Orthopedic  No limitations of motion  feet .  No crepitus or effusions noted.  No bony pathology or digital deformities noted.  Skin  normotropic skin with no porokeratosis noted bilaterally.  No signs of infections or ulcers noted.     Onychomycosis  Nails  B/L.  Pain in right toes  Pain in left toes  Debridement of nails both feet followed trimming the nails with dremel tool.    RTC 3 months.   Helane Gunther DPM

## 2023-07-25 DIAGNOSIS — H353111 Nonexudative age-related macular degeneration, right eye, early dry stage: Secondary | ICD-10-CM | POA: Diagnosis not present

## 2023-07-25 DIAGNOSIS — H524 Presbyopia: Secondary | ICD-10-CM | POA: Diagnosis not present

## 2023-07-25 DIAGNOSIS — H353121 Nonexudative age-related macular degeneration, left eye, early dry stage: Secondary | ICD-10-CM | POA: Diagnosis not present

## 2023-08-12 ENCOUNTER — Ambulatory Visit: Payer: Self-pay

## 2023-08-12 NOTE — Telephone Encounter (Signed)
 FYI Only or Action Required?: FYI only for provider.  Patient was last seen in primary care on 06/06/2023 by Alphonsa Glendia LABOR, MD. Called Nurse Triage reporting Shortness of Breath. Symptoms began a week ago. Interventions attempted: Other: PRN Oxygen  at 4L. Symptoms are: gradually worsening.  Triage Disposition: Go to ED Now (Notify PCP)  Patient/caregiver understands and will follow disposition?: Yes    Copied from CRM [id number]. Topic: Clinical - Red Word Triage >> Aug 12, 2023  9:55 AM Willma SAUNDERS wrote: [facility] that prompted transfer to Nurse Triage: Patient's daughter [name] states the patient is experiencing burning and discharge from both eyes for the past week. Patient has also been short of breath and fatigue, as well as not eating, refused to eat anything yesterday. Says stomach looks like it has a big ball in it. Reason for Disposition  Oxygen  level (e.g., pulse oximetry) 90 percent or lower  Answer Assessment - Initial Assessment Questions Speaking to his daughter [name]. She says that he has been having increasing SOB since yesterday around 7pm. She had to put on his 4L oxygen  that is normally PRN use and he wore it until 0100 when he went to bed. She says he wears the CPAP at night. This morning oxygen  level 89-92% no oxygen  right after coming off CPAP. She says he's been having crusty discharge from both eyes in the morning, he's been wheezing, mucus in his mouth. She says he's not eating much, not drinking much, little urine that is concentrated and smells strong first thing in the morning. He told her that he feel like he's shutting down, he doesn't feel good. She says he's just laying in the bed not wanting to do anything more than that. Advised ED for evaluation. She says he will only listen to Dr. Alphonsa. Advised to let him know that this is a nurse from the office and it is recommended to go due to the SOB to be evaluated. He agreed to go to [facility] ED. Advised I will let Dr.  Alphonsa know. She says she will get him up, bathed and take him by care, advised if unable to call 911, she verbalized understanding.   1. RESPIRATORY STATUS: Describe your breathing? (e.g., wheezing, shortness of breath, unable to speak, severe coughing)      Shortness of breath, wheezing 2. ONSET: When did this breathing problem begin?      Chronic SOB 3. PATTERN Does the difficult breathing come and go, or has it been constant since it started?      Constant SOB 4. SEVERITY: How bad is your breathing? (e.g., mild, moderate, severe)    - MILD: No SOB at rest, mild SOB with walking, speaks normally in sentences, can lie down, no retractions, pulse < 100.    - MODERATE: SOB at rest, SOB with minimal exertion and prefers to sit, cannot lie down flat, speaks in phrases, mild retractions, audible wheezing, pulse 100-120.    - SEVERE: Very SOB at rest, speaks in single words, struggling to breathe, sitting hunched forward, retractions, pulse > 120      Moderate 5. RECURRENT SYMPTOM: Have you had difficulty breathing before? If Yes, ask: When was the last time? and What happened that time?      Chronic COPD  6. LUNG HISTORY: Do you have any history of lung disease?  (e.g., pulmonary embolus, asthma, emphysema)     COPD 7. CAUSE: What do you think is causing the breathing problem?  COPD 9. OTHER SYMPTOMS: Do you have any other symptoms? (e.g., dizziness, runny nose, cough, chest pain, fever)     Weakness, not eating, fatigue 10. O2 SATURATION MONITOR:  Do you use an oxygen  saturation monitor (pulse oximeter) at home? If Yes, ask: What is your reading (oxygen  level) today? What is your usual oxygen  saturation reading? (e.g., 95%)       Haven't checked it, he wears 4L oxygen  as needed, CPAP at night  Protocols used: Breathing Difficulty-A-AH

## 2023-08-13 NOTE — Telephone Encounter (Signed)
 Copied from CRM [id number]. Topic: Clinical - Red Word Triage >> Aug 12, 2023  9:55 AM Willma SAUNDERS wrote: [facility] that prompted transfer to Nurse Triage: Patient's daughter [name] states the patient is experiencing burning and discharge from both eyes for the past week. Patient has also been short of breath and fatigue, as well as not eating, refused to eat anything yesterday. Says stomach looks like it has a big ball in it.

## 2023-08-14 ENCOUNTER — Emergency Department (HOSPITAL_COMMUNITY)

## 2023-08-14 ENCOUNTER — Inpatient Hospital Stay (HOSPITAL_COMMUNITY)
Admission: EM | Admit: 2023-08-14 | Discharge: 2023-08-21 | DRG: 853 | Disposition: A | Discharge status: Skilled Nursing Facility | Attending: Internal Medicine | Admitting: Internal Medicine

## 2023-08-14 ENCOUNTER — Telehealth: Payer: Self-pay | Admitting: Family Medicine

## 2023-08-14 DIAGNOSIS — R0689 Other abnormalities of breathing: Secondary | ICD-10-CM | POA: Diagnosis not present

## 2023-08-14 DIAGNOSIS — K2100 Gastro-esophageal reflux disease with esophagitis, without bleeding: Secondary | ICD-10-CM

## 2023-08-14 DIAGNOSIS — R0902 Hypoxemia: Secondary | ICD-10-CM | POA: Diagnosis not present

## 2023-08-14 DIAGNOSIS — I130 Hypertensive heart and chronic kidney disease with heart failure and stage 1 through stage 4 chronic kidney disease, or unspecified chronic kidney disease: Secondary | ICD-10-CM | POA: Diagnosis not present

## 2023-08-14 DIAGNOSIS — Z66 Do not resuscitate: Secondary | ICD-10-CM | POA: Diagnosis not present

## 2023-08-14 DIAGNOSIS — D72829 Elevated white blood cell count, unspecified: Secondary | ICD-10-CM | POA: Diagnosis not present

## 2023-08-14 DIAGNOSIS — F0393 Unspecified dementia, unspecified severity, with mood disturbance: Secondary | ICD-10-CM | POA: Diagnosis present

## 2023-08-14 DIAGNOSIS — J9601 Acute respiratory failure with hypoxia: Secondary | ICD-10-CM | POA: Diagnosis not present

## 2023-08-14 DIAGNOSIS — N189 Chronic kidney disease, unspecified: Secondary | ICD-10-CM | POA: Diagnosis not present

## 2023-08-14 DIAGNOSIS — Z7401 Bed confinement status: Secondary | ICD-10-CM | POA: Diagnosis not present

## 2023-08-14 DIAGNOSIS — W1830XA Fall on same level, unspecified, initial encounter: Secondary | ICD-10-CM | POA: Diagnosis present

## 2023-08-14 DIAGNOSIS — S199XXA Unspecified injury of neck, initial encounter: Secondary | ICD-10-CM | POA: Diagnosis not present

## 2023-08-14 DIAGNOSIS — N134 Hydroureter: Secondary | ICD-10-CM | POA: Diagnosis not present

## 2023-08-14 DIAGNOSIS — F32A Depression, unspecified: Secondary | ICD-10-CM | POA: Diagnosis present

## 2023-08-14 DIAGNOSIS — R0602 Shortness of breath: Secondary | ICD-10-CM | POA: Diagnosis not present

## 2023-08-14 DIAGNOSIS — I700 Atherosclerosis of aorta: Secondary | ICD-10-CM | POA: Diagnosis not present

## 2023-08-14 DIAGNOSIS — Z79899 Other long term (current) drug therapy: Secondary | ICD-10-CM

## 2023-08-14 DIAGNOSIS — I5032 Chronic diastolic (congestive) heart failure: Secondary | ICD-10-CM | POA: Diagnosis not present

## 2023-08-14 DIAGNOSIS — Z83438 Family history of other disorder of lipoprotein metabolism and other lipidemia: Secondary | ICD-10-CM

## 2023-08-14 DIAGNOSIS — I440 Atrioventricular block, first degree: Secondary | ICD-10-CM | POA: Diagnosis present

## 2023-08-14 DIAGNOSIS — Z823 Family history of stroke: Secondary | ICD-10-CM

## 2023-08-14 DIAGNOSIS — G4733 Obstructive sleep apnea (adult) (pediatric): Secondary | ICD-10-CM | POA: Diagnosis not present

## 2023-08-14 DIAGNOSIS — B964 Proteus (mirabilis) (morganii) as the cause of diseases classified elsewhere: Secondary | ICD-10-CM | POA: Diagnosis present

## 2023-08-14 DIAGNOSIS — S0990XA Unspecified injury of head, initial encounter: Secondary | ICD-10-CM | POA: Diagnosis not present

## 2023-08-14 DIAGNOSIS — I2510 Atherosclerotic heart disease of native coronary artery without angina pectoris: Secondary | ICD-10-CM | POA: Diagnosis not present

## 2023-08-14 DIAGNOSIS — N1831 Chronic kidney disease, stage 3a: Secondary | ICD-10-CM | POA: Diagnosis not present

## 2023-08-14 DIAGNOSIS — R627 Adult failure to thrive: Secondary | ICD-10-CM | POA: Diagnosis not present

## 2023-08-14 DIAGNOSIS — N400 Enlarged prostate without lower urinary tract symptoms: Secondary | ICD-10-CM | POA: Diagnosis present

## 2023-08-14 DIAGNOSIS — N136 Pyonephrosis: Secondary | ICD-10-CM | POA: Diagnosis present

## 2023-08-14 DIAGNOSIS — Z8711 Personal history of peptic ulcer disease: Secondary | ICD-10-CM

## 2023-08-14 DIAGNOSIS — B370 Candidal stomatitis: Secondary | ICD-10-CM | POA: Diagnosis not present

## 2023-08-14 DIAGNOSIS — Z955 Presence of coronary angioplasty implant and graft: Secondary | ICD-10-CM

## 2023-08-14 DIAGNOSIS — I110 Hypertensive heart disease with heart failure: Secondary | ICD-10-CM | POA: Diagnosis not present

## 2023-08-14 DIAGNOSIS — Z96652 Presence of left artificial knee joint: Secondary | ICD-10-CM | POA: Diagnosis not present

## 2023-08-14 DIAGNOSIS — J440 Chronic obstructive pulmonary disease with acute lower respiratory infection: Secondary | ICD-10-CM | POA: Diagnosis not present

## 2023-08-14 DIAGNOSIS — I6782 Cerebral ischemia: Secondary | ICD-10-CM | POA: Diagnosis not present

## 2023-08-14 DIAGNOSIS — R1310 Dysphagia, unspecified: Secondary | ICD-10-CM | POA: Diagnosis present

## 2023-08-14 DIAGNOSIS — J9690 Respiratory failure, unspecified, unspecified whether with hypoxia or hypercapnia: Secondary | ICD-10-CM | POA: Diagnosis not present

## 2023-08-14 DIAGNOSIS — R109 Unspecified abdominal pain: Secondary | ICD-10-CM | POA: Diagnosis not present

## 2023-08-14 DIAGNOSIS — Z6838 Body mass index (BMI) 38.0-38.9, adult: Secondary | ICD-10-CM | POA: Diagnosis not present

## 2023-08-14 DIAGNOSIS — E66813 Obesity, class 3: Secondary | ICD-10-CM | POA: Diagnosis present

## 2023-08-14 DIAGNOSIS — N3000 Acute cystitis without hematuria: Principal | ICD-10-CM

## 2023-08-14 DIAGNOSIS — Z87891 Personal history of nicotine dependence: Secondary | ICD-10-CM

## 2023-08-14 DIAGNOSIS — J9611 Chronic respiratory failure with hypoxia: Secondary | ICD-10-CM | POA: Diagnosis not present

## 2023-08-14 DIAGNOSIS — N201 Calculus of ureter: Secondary | ICD-10-CM | POA: Diagnosis not present

## 2023-08-14 DIAGNOSIS — N1832 Chronic kidney disease, stage 3b: Secondary | ICD-10-CM | POA: Diagnosis present

## 2023-08-14 DIAGNOSIS — Z833 Family history of diabetes mellitus: Secondary | ICD-10-CM

## 2023-08-14 DIAGNOSIS — K8020 Calculus of gallbladder without cholecystitis without obstruction: Secondary | ICD-10-CM | POA: Diagnosis not present

## 2023-08-14 DIAGNOSIS — R531 Weakness: Secondary | ICD-10-CM | POA: Diagnosis not present

## 2023-08-14 DIAGNOSIS — N202 Calculus of kidney with calculus of ureter: Secondary | ICD-10-CM | POA: Diagnosis not present

## 2023-08-14 DIAGNOSIS — J441 Chronic obstructive pulmonary disease with (acute) exacerbation: Secondary | ICD-10-CM | POA: Diagnosis not present

## 2023-08-14 DIAGNOSIS — N390 Urinary tract infection, site not specified: Secondary | ICD-10-CM | POA: Diagnosis present

## 2023-08-14 DIAGNOSIS — R6520 Severe sepsis without septic shock: Secondary | ICD-10-CM | POA: Diagnosis not present

## 2023-08-14 DIAGNOSIS — A419 Sepsis, unspecified organism: Principal | ICD-10-CM | POA: Diagnosis present

## 2023-08-14 DIAGNOSIS — Z7982 Long term (current) use of aspirin: Secondary | ICD-10-CM

## 2023-08-14 DIAGNOSIS — I10 Essential (primary) hypertension: Secondary | ICD-10-CM | POA: Diagnosis present

## 2023-08-14 DIAGNOSIS — K5900 Constipation, unspecified: Secondary | ICD-10-CM | POA: Diagnosis present

## 2023-08-14 DIAGNOSIS — I959 Hypotension, unspecified: Secondary | ICD-10-CM | POA: Diagnosis not present

## 2023-08-14 DIAGNOSIS — I509 Heart failure, unspecified: Secondary | ICD-10-CM | POA: Diagnosis not present

## 2023-08-14 DIAGNOSIS — R296 Repeated falls: Secondary | ICD-10-CM | POA: Diagnosis present

## 2023-08-14 DIAGNOSIS — M79605 Pain in left leg: Secondary | ICD-10-CM | POA: Diagnosis not present

## 2023-08-14 DIAGNOSIS — J811 Chronic pulmonary edema: Secondary | ICD-10-CM | POA: Diagnosis not present

## 2023-08-14 DIAGNOSIS — J449 Chronic obstructive pulmonary disease, unspecified: Secondary | ICD-10-CM | POA: Diagnosis present

## 2023-08-14 DIAGNOSIS — Z8249 Family history of ischemic heart disease and other diseases of the circulatory system: Secondary | ICD-10-CM

## 2023-08-14 DIAGNOSIS — M7732 Calcaneal spur, left foot: Secondary | ICD-10-CM | POA: Diagnosis not present

## 2023-08-14 DIAGNOSIS — Z87442 Personal history of urinary calculi: Secondary | ICD-10-CM

## 2023-08-14 DIAGNOSIS — Z860100 Personal history of colon polyps, unspecified: Secondary | ICD-10-CM

## 2023-08-14 DIAGNOSIS — J189 Pneumonia, unspecified organism: Secondary | ICD-10-CM | POA: Diagnosis present

## 2023-08-14 DIAGNOSIS — Z841 Family history of disorders of kidney and ureter: Secondary | ICD-10-CM

## 2023-08-14 DIAGNOSIS — E785 Hyperlipidemia, unspecified: Secondary | ICD-10-CM | POA: Diagnosis not present

## 2023-08-14 DIAGNOSIS — R918 Other nonspecific abnormal finding of lung field: Secondary | ICD-10-CM | POA: Diagnosis not present

## 2023-08-14 DIAGNOSIS — M6281 Muscle weakness (generalized): Secondary | ICD-10-CM | POA: Diagnosis not present

## 2023-08-14 DIAGNOSIS — R0989 Other specified symptoms and signs involving the circulatory and respiratory systems: Secondary | ICD-10-CM | POA: Diagnosis not present

## 2023-08-14 DIAGNOSIS — Z9981 Dependence on supplemental oxygen: Secondary | ICD-10-CM

## 2023-08-14 DIAGNOSIS — Z801 Family history of malignant neoplasm of trachea, bronchus and lung: Secondary | ICD-10-CM

## 2023-08-14 DIAGNOSIS — Z743 Need for continuous supervision: Secondary | ICD-10-CM | POA: Diagnosis not present

## 2023-08-14 LAB — CBC WITH DIFFERENTIAL/PLATELET
Abs Immature Granulocytes: 0.08 K/uL — ABNORMAL HIGH (ref 0.00–0.07)
Basophils Absolute: 0.1 K/uL (ref 0.0–0.1)
Basophils Relative: 0 %
Eosinophils Absolute: 0.1 K/uL (ref 0.0–0.5)
Eosinophils Relative: 0 %
HCT: 34.7 % — ABNORMAL LOW (ref 39.0–52.0)
Hemoglobin: 11.3 g/dL — ABNORMAL LOW (ref 13.0–17.0)
Immature Granulocytes: 1 %
Lymphocytes Relative: 22 %
Lymphs Abs: 3.4 K/uL (ref 0.7–4.0)
MCH: 29.9 pg (ref 26.0–34.0)
MCHC: 32.6 g/dL (ref 30.0–36.0)
MCV: 91.8 fL (ref 80.0–100.0)
Monocytes Absolute: 1.9 K/uL — ABNORMAL HIGH (ref 0.1–1.0)
Monocytes Relative: 13 %
Neutro Abs: 9.9 K/uL — ABNORMAL HIGH (ref 1.7–7.7)
Neutrophils Relative %: 64 %
Platelets: 291 K/uL (ref 150–400)
RBC: 3.78 MIL/uL — ABNORMAL LOW (ref 4.22–5.81)
RDW: 12.6 % (ref 11.5–15.5)
WBC: 15.3 K/uL — ABNORMAL HIGH (ref 4.0–10.5)
nRBC: 0 % (ref 0.0–0.2)

## 2023-08-14 LAB — COMPREHENSIVE METABOLIC PANEL WITH GFR
ALT: 23 U/L (ref 0–44)
AST: 30 U/L (ref 15–41)
Albumin: 2.5 g/dL — ABNORMAL LOW (ref 3.5–5.0)
Alkaline Phosphatase: 73 U/L (ref 38–126)
Anion gap: 10 (ref 5–15)
BUN: 31 mg/dL — ABNORMAL HIGH (ref 8–23)
CO2: 22 mmol/L (ref 22–32)
Calcium: 11 mg/dL — ABNORMAL HIGH (ref 8.9–10.3)
Chloride: 101 mmol/L (ref 98–111)
Creatinine, Ser: 1.82 mg/dL — ABNORMAL HIGH (ref 0.61–1.24)
GFR, Estimated: 36 mL/min — ABNORMAL LOW (ref >60)
Glucose, Bld: 135 mg/dL — ABNORMAL HIGH (ref 70–99)
Potassium: 4.2 mmol/L (ref 3.5–5.1)
Sodium: 133 mmol/L — ABNORMAL LOW (ref 135–145)
Total Bilirubin: 1 mg/dL (ref 0.0–1.2)
Total Protein: 7 g/dL (ref 6.5–8.1)

## 2023-08-14 LAB — TROPONIN I (HIGH SENSITIVITY)
Troponin I (High Sensitivity): 16 ng/L (ref <18)
Troponin I (High Sensitivity): 14 ng/L (ref <18)

## 2023-08-14 LAB — RESPIRATORY PANEL BY PCR

## 2023-08-14 LAB — I-STAT CHEM 8, ED
BUN: 32 mg/dL — ABNORMAL HIGH (ref 8–23)
Calcium, Ion: 1.49 mmol/L — ABNORMAL HIGH (ref 1.15–1.40)
Chloride: 102 mmol/L (ref 98–111)
Creatinine, Ser: 1.8 mg/dL — ABNORMAL HIGH (ref 0.61–1.24)
Glucose, Bld: 135 mg/dL — ABNORMAL HIGH (ref 70–99)
HCT: 35 % — ABNORMAL LOW (ref 39.0–52.0)
Hemoglobin: 11.9 g/dL — ABNORMAL LOW (ref 13.0–17.0)
Potassium: 4.2 mmol/L (ref 3.5–5.1)
Sodium: 133 mmol/L — ABNORMAL LOW (ref 135–145)
TCO2: 25 mmol/L (ref 22–32)

## 2023-08-14 LAB — URINALYSIS, W/ REFLEX TO CULTURE (INFECTION SUSPECTED)
Bilirubin Urine: NEGATIVE
Glucose, UA: NEGATIVE mg/dL
Ketones, ur: NEGATIVE mg/dL
Nitrite: NEGATIVE
Protein, ur: 30 mg/dL — AB
Specific Gravity, Urine: 1.01 (ref 1.005–1.030)
WBC, UA: >50 WBC/hpf (ref 0–5)
pH: 6 (ref 5.0–8.0)

## 2023-08-14 LAB — I-STAT CG4 LACTIC ACID, ED
Lactic Acid, Venous: 1 mmol/L (ref 0.5–1.9)
Lactic Acid, Venous: 0.8 mmol/L (ref 0.5–1.9)

## 2023-08-14 LAB — BRAIN NATRIURETIC PEPTIDE: B Natriuretic Peptide: 171.5 pg/mL — ABNORMAL HIGH (ref 0.0–100.0)

## 2023-08-14 MED ORDER — PRAVASTATIN SODIUM 40 MG PO TABS
40.0000 mg | ORAL_TABLET | Freq: Every day | ORAL | Status: DC
  Administered 2023-08-15 – 2023-08-21 (×7): 40 mg via ORAL
  Filled 2023-08-14 (×7): qty 1

## 2023-08-14 MED ORDER — SODIUM CHLORIDE 0.9 % IV SOLN
1.0000 g | INTRAVENOUS | Status: DC
  Administered 2023-08-15 – 2023-08-16 (×2): 1 g via INTRAVENOUS
  Filled 2023-08-14 (×2): qty 10

## 2023-08-14 MED ORDER — ISOSORBIDE MONONITRATE ER 30 MG PO TB24
30.0000 mg | ORAL_TABLET | Freq: Every day | ORAL | Status: DC
  Administered 2023-08-15 – 2023-08-21 (×7): 30 mg via ORAL
  Filled 2023-08-14 (×7): qty 1

## 2023-08-14 MED ORDER — AZITHROMYCIN 500 MG PO TABS
500.0000 mg | ORAL_TABLET | Freq: Every day | ORAL | Status: AC
  Administered 2023-08-15 – 2023-08-16 (×3): 500 mg via ORAL
  Filled 2023-08-14 (×3): qty 1

## 2023-08-14 MED ORDER — POLYETHYLENE GLYCOL 3350 17 G PO PACK: 17.0000 g | PACK | Freq: Every day | ORAL | Status: DC | PRN

## 2023-08-14 MED ORDER — ASPIRIN 81 MG PO TBEC
81.0000 mg | DELAYED_RELEASE_TABLET | Freq: Every day | ORAL | Status: DC
  Administered 2023-08-15 – 2023-08-21 (×6): 81 mg via ORAL
  Filled 2023-08-14 (×6): qty 1

## 2023-08-14 MED ORDER — ONDANSETRON HCL 4 MG/2ML IJ SOLN: 4.0000 mg | Freq: Four times a day (QID) | INTRAMUSCULAR | Status: DC | PRN

## 2023-08-14 MED ORDER — ACETAMINOPHEN 500 MG PO TABS
1000.0000 mg | ORAL_TABLET | Freq: Four times a day (QID) | ORAL | Status: DC | PRN
  Administered 2023-08-19: 1000 mg via ORAL
  Filled 2023-08-14: qty 2

## 2023-08-14 MED ORDER — TAMSULOSIN HCL 0.4 MG PO CAPS
0.4000 mg | ORAL_CAPSULE | Freq: Every day | ORAL | Status: DC
  Administered 2023-08-15 – 2023-08-21 (×7): 0.4 mg via ORAL
  Filled 2023-08-14 (×7): qty 1

## 2023-08-14 MED ORDER — MELATONIN 3 MG PO TABS: 6.0000 mg | ORAL_TABLET | Freq: Every evening | ORAL | Status: DC | PRN

## 2023-08-14 MED ORDER — DULOXETINE HCL 60 MG PO CPEP: ORAL_CAPSULE | ORAL | 1 refills | Status: DC

## 2023-08-14 MED ORDER — SODIUM CHLORIDE 0.9 % IV SOLN
1.0000 g | Freq: Once | INTRAVENOUS | Status: AC
  Administered 2023-08-14: 1 g via INTRAVENOUS
  Filled 2023-08-14: qty 10

## 2023-08-14 MED ORDER — CARVEDILOL 12.5 MG PO TABS
12.5000 mg | ORAL_TABLET | Freq: Two times a day (BID) | ORAL | Status: DC
  Administered 2023-08-15 – 2023-08-20 (×10): 12.5 mg via ORAL
  Filled 2023-08-14 (×10): qty 1

## 2023-08-14 MED ORDER — DULOXETINE HCL 60 MG PO CPEP
60.0000 mg | ORAL_CAPSULE | Freq: Every day | ORAL | Status: DC
  Administered 2023-08-15 – 2023-08-21 (×6): 60 mg via ORAL
  Filled 2023-08-14 (×6): qty 1

## 2023-08-14 MED ORDER — TORSEMIDE 20 MG PO TABS
20.0000 mg | ORAL_TABLET | Freq: Every day | ORAL | Status: DC
  Administered 2023-08-15 – 2023-08-21 (×7): 20 mg via ORAL
  Filled 2023-08-14 (×7): qty 1

## 2023-08-14 MED ORDER — ALBUTEROL SULFATE (2.5 MG/3ML) 0.083% IN NEBU: 2.5000 mg | INHALATION_SOLUTION | RESPIRATORY_TRACT | Status: DC | PRN

## 2023-08-14 MED ORDER — SODIUM CHLORIDE 0.9% FLUSH
3.0000 mL | Freq: Two times a day (BID) | INTRAVENOUS | Status: DC
  Administered 2023-08-14 – 2023-08-21 (×14): 3 mL via INTRAVENOUS

## 2023-08-14 NOTE — H&P (Signed)
 History and Physical    [name] FMW:[id number] DOB: February 06, 1940 DOA: 08/14/2023  PCP: Alphonsa Glendia LABOR, MD   Patient coming from: Home   Chief Complaint:  Chief Complaint  Patient presents with   Weakness    HPI:  [name] is a 83 y.o. male with hx of CAD, HFpEF, COPD on 4 L O2, OSA on CPAP, ulcerative colitis, ankylosing spondylitis, hypertension, hyperlipidemia, CKD 3A, primary  hyperparathyroidism, morbid obesity, mood disorder, dementia, who presented with progressive fatigue, culminating in a ground-level fall.  History is mainly provided by patient's daughter at the bedside.  She reports that he has had a long gradual decline especially over the past year with diminishing oral intake, weight loss 317 LB -> to 274 LB.  Has dysphagia to solids greater than liquids and eats a bite-size diet.  worsening weakness and requiring more help with ADLs.  He has had 2 falls in the past 6 months.  Most recently it was yesterday, no known injury fall from fall, mechanical in nature.  At this point daughter does not feel that she can safely care for him at home and is hoping for placement in a nursing facility.  Over the past 2 weeks he has had a more sharp decline.  Reportedly there is a cold going around the family.  He has associated sinus congestion, cough which is productive, including scant hemoptysis.  Intermittent diarrhea and constipation.  Urine has been malodorous.  No chest pain, shortness of breath, lower extremity edema.  Reportedly still taking his torsemide  although has not been filled since 1/' 25.  Review of Systems:  ROS complete and negative except as marked above   No Known Allergies  Prior to Admission medications   Medication Sig Start Date End Date Taking? Authorizing Provider  acetaminophen  (TYLENOL ) 500 MG tablet Take 2 tablets (1,000 mg total) by mouth every 6 (six) hours as needed for headache (pain). 11/24/20   Alphonsa Glendia LABOR, MD  albuterol  (VENTOLIN   HFA) 108 (90 Base) MCG/ACT inhaler Inhale 1-2 puffs into the lungs every 6 (six) hours as needed for wheezing or shortness of breath. 12/06/22   Amin, Ankit C, MD  amLODipine  (NORVASC ) 5 MG tablet TAKE 1 TABLET BY MOUTH DAILY 04/01/23   Alphonsa Glendia LABOR, MD  ammonium lactate  (AMLACTIN DAILY) 12 % lotion Apply 1 Application topically as needed for dry skin. 02/28/23   Lamount Ethan CROME, DPM  aspirin  EC 81 MG tablet Take 81 mg by mouth daily. Swallow whole.    [provider]  carvedilol  (COREG ) 12.5 MG tablet Take 1 tablet (12.5 mg total) by mouth 2 (two) times daily with a meal. 03/22/23   Alphonsa, Glendia LABOR, MD  clobetasol (TEMOVATE) 0.05 % external solution  06/20/23   [provider]  DULoxetine  (CYMBALTA ) 60 MG capsule TAKE 1 CAPSULE BY MOUTH DAILY 08/14/23   Alphonsa Glendia LABOR, MD  hydrALAZINE  (APRESOLINE ) 25 MG tablet 25 mg one bid Patient taking differently: Take 25 mg by mouth 2 (two) times daily. 25 mg one bid 04/22/23   Alphonsa Glendia LABOR, MD  isosorbide  mononitrate (IMDUR ) 30 MG 24 hr tablet TAKE 2 TABLETS(60 MG) BY MOUTH DAILY 06/06/23   Alphonsa Glendia LABOR, MD  mupirocin  ointment (BACTROBAN ) 2 % Apply thin amount bid prn 12/21/22   Luking, Glendia LABOR, MD  nitroGLYCERIN  (NITROSTAT ) 0.4 MG SL tablet Place 1 tablet (0.4 mg total) under the tongue every 5 (five) minutes as needed for chest pain. 02/26/23  Alphonsa Glendia LABOR, MD  OVER THE COUNTER MEDICATION Oxygen  4 LPM    [provider]  pravastatin  (PRAVACHOL ) 40 MG tablet 1 qd 06/06/23   Alphonsa Glendia LABOR, MD  spironolactone  (ALDACTONE ) 25 MG tablet Take 0.5 tablets (12.5 mg total) by mouth daily. 06/06/23   Alphonsa Glendia LABOR, MD  tamsulosin  (FLOMAX ) 0.4 MG CAPS capsule Take 1 capsule (0.4 mg total) by mouth daily. 06/06/23   Alphonsa Glendia LABOR, MD  torsemide  (DEMADEX ) 20 MG tablet 1 qam and may take 2 pill every day prn if weight gain greater than 3 pounds in a week 06/06/23   Luking, Glendia LABOR, MD  Vitamin D , Ergocalciferol , (DRISDOL ) 1.25 MG (50000 UNIT)  CAPS capsule TAKE 1 CAPSULE BY MOUTH EVERY 7 DAYS 05/17/23   Alphonsa Glendia LABOR, MD    Past Medical History:  Diagnosis Date   Ankylosing spondylitis ([name]) 11/07/2018   Asthma    BPH (benign prostatic hyperplasia)    CAD (coronary artery disease) 01/1995   CHF (congestive heart failure) ([name])    Clostridium difficile colitis 04/2005   Colitis 2011   COPD (chronic obstructive pulmonary disease) ([name])    Depression    Diverticulosis    DJD (degenerative joint disease)    Fracture, [place], left, closed 11/29/2016   Gastric ulcer 04/17/2010   Three 5mm gastric ulcers, H.pylori serologies were negative   GERD (gastroesophageal reflux disease)    History of kidney stones    Hyperlipidemia    Hypertension    Idiopathic chronic inflammatory bowel disease 05/18/2010   left-sided UC   Kidney stone    Morbid obesity ([name]) 03/12/2018   Obstructive sleep apnea    on Cpap   Reflux 02/1995   S/P endoscopy 07/24/2010   retained gastric contents, benign bx    Past Surgical History:  Procedure Laterality Date   ANORECTAL MANOMETRY  2016   [facility]: concern for possible fissure. Noted pelvic floor dyssnyergy   BIOPSY  07/29/2017   Procedure: BIOPSY;  Surgeon: Shaaron Lamar HERO, MD;  Location: [facility];  Service: Endoscopy;;  ascending and sigmoid colon   CARDIAC CATHETERIZATION     with stent   CARDIOVASCULAR STRESS TEST  07/21/2009   No scintigraphic evidence of inducible myocardial ischemia   CARPAL TUNNEL RELEASE Left 01/17/2017   Procedure: LEFT CARPAL TUNNEL RELEASE;  Surgeon: Margrette Taft BRAVO, MD;  Location: [facility];  Service: Orthopedics;  Laterality: Left;   CATARACT EXTRACTION, BILATERAL Bilateral    CERVICAL SPINE SURGERY     C4-5   COLONOSCOPY  04/2005   granularity and friability erosions from rectum to 40cm. Bx infection vs IBD. C. Diff positive at the time.    COLONOSCOPY  05/2010   [facility]: left-sided UC, bx with no dysplasia, shallow diverticula   COLONOSCOPY [name] 11/07/2012    MFM:[id number]-[id number] proctocolitis status post segmental biopsy/Sigmoid colon polyps removed as described above. Procedure compromisd by technical difficulties. bx: Inflammation limited to sigmoid and rectum on pathology.   COLONOSCOPY  04/2014   Dr. Landy at [facility]: well localized proctocolitis limited to sigmoid   COLONOSCOPY WITH PROPOFOL  [name] 07/29/2017   diverticulosis in colon, three 4-6 mm polyps at splenic flexure and in cecum, one 10 mm polyp in rectum, abnormal rectum and sigmoid consistent with active UC   CORONARY STENT PLACEMENT  01/1995   ESOPHAGEAL DILATION  01/20/2022   Procedure: ESOPHAGEAL DILATION;  Surgeon: Elicia Claw, MD;  Location: [facility];  Service: Gastroenterology;;   ESOPHAGOGASTRODUODENOSCOPY  07/24/2010  MFM:Wnmfjo esophagus   ESOPHAGOGASTRODUODENOSCOPY  04/2014   Dr. Landy at [facility]: negative small bowel biopsies   ESOPHAGOGASTRODUODENOSCOPY (EGD) WITH PROPOFOL  [name] 01/20/2022   Procedure: ESOPHAGOGASTRODUODENOSCOPY (EGD) WITH PROPOFOL ;  Surgeon: Elicia Claw, MD;  Location: [facility];  Service: Gastroenterology;  Laterality: [name];   FOOT SURGERY Bilateral two   KNEE SURGERY  two   POLYPECTOMY  07/29/2017   Procedure: POLYPECTOMY;  Surgeon: Shaaron Lamar HERO, MD;  [facility]: [facility];  Service: Endoscopy;;  splenic flexure, ascending colon polyp;rectal   SHOULDER SURGERY  two   TONSILLECTOMY     TRANSTHORACIC ECHOCARDIOGRAM  03/23/2009   EF 60-65%, normal LV systolic function   ULNAR HEAD EXCISION Left 01/17/2017   Procedure: LEFT ULNAR HEAD RESECTION;  Surgeon: Margrette Taft BRAVO, MD;  Location: [facility];  Service: Orthopedics;  Laterality: Left;     reports that he quit smoking about 53 years ago. His smoking use included cigarettes. He started smoking about 73 years ago. He has a 20 pack-year smoking history. He has been exposed to tobacco smoke. He has never used smokeless tobacco. He reports that he does not drink alcohol and does not use  drugs.  Family History  Problem Relation Age of Onset   Lung cancer Mother    Cancer Mother        breast   Diabetes Mother    Stroke Father    Hypertension Father    Hyperlipidemia Father    Kidney failure Brother    Other Child        blood infection   Colon cancer Neg Hx      Physical Exam: Vitals:   08/14/23 1845 08/14/23 1900 08/14/23 2108 08/14/23 2232  BP: 97/60 (!) 133/42  (!) 118/48  Pulse: 65 64  (!) 58  Resp: 20 (!) 23  (!) 31  Temp:   98 F (36.7 C)   TempSrc:   Oral   SpO2: 92% 94%  100%    Gen: Awake, alert, chronically ill-appearing CV: Regular, normal S1, S2, no murmurs  Resp: Normal WOB, on [place], rales in the bases right greater than left Abd: Obese, normoactive, nontender MSK: Symmetric, trace edema at the feet, not above this. Skin: No rashes or lesions to exposed skin  Neuro: Alert and interactive  Psych: euthymic, appropriate    Data review:   Labs reviewed, notable for:   Lactate 1 -> 0.8 NA 133, BUN 31, creatinine 1.8 near baseline 1.7 Albumin  2.5 BNP 171 High-sensitivity Trop 16 -> 14 WBC 15 UA few bacteria, greater than 50 WBC, WBC clumps, positive leukocyte, negative nitrite, no RBC.  Micro:  Results for orders placed or performed during the hospital encounter of 12/02/22  Culture, blood (Routine x 2)     Status: None   Collection Time: 12/02/22 11:20 PM   Specimen: BLOOD  Result Value Ref Range Status   Specimen Description BLOOD SITE NOT SPECIFIED  Final   Special Requests   Final    BOTTLES DRAWN AEROBIC AND ANAEROBIC Blood Culture results may not be optimal due to an excessive volume of blood received in culture bottles   Culture   Final    NO GROWTH 5 DAYS Performed at [facility], [address], [place], [place] [address]    Report Status 12/08/2022 FINAL  Final  Resp panel by RT-PCR (RSV, Flu A&B, Covid) Anterior Nasal Swab     Status: None   Collection Time: 12/02/22 11:54 PM   Specimen: Anterior Nasal  Swab   Result Value Ref Range Status   SARS Coronavirus 2 by RT PCR NEGATIVE NEGATIVE Final   Influenza A by PCR NEGATIVE NEGATIVE Final   Influenza B by PCR NEGATIVE NEGATIVE Final    Comment: (NOTE) The Xpert Xpress SARS-CoV-2/FLU/RSV plus assay is intended as an aid in the diagnosis of influenza from Nasopharyngeal swab specimens and should not be used as a sole basis for treatment. Nasal washings and aspirates are unacceptable for Xpert Xpress SARS-CoV-2/FLU/RSV testing.  Fact Sheet for Patients: BloggerCourse.com  Fact Sheet for Healthcare Providers: SeriousBroker.it  This test is not yet approved or cleared by the [place]  FDA and has been authorized for detection and/or diagnosis of SARS-CoV-2 by FDA under an Emergency Use Authorization (EUA). This EUA will remain in effect (meaning this test can be used) for the duration of the COVID-19 declaration under Section 564(b)(1) of the Act, 21 U.S.C. section 360bbb-3(b)(1), unless the authorization is terminated or revoked.     Resp Syncytial Virus by PCR NEGATIVE NEGATIVE Final    Comment: (NOTE) Fact Sheet for Patients: BloggerCourse.com  Fact Sheet for Healthcare Providers: SeriousBroker.it  This test is not yet approved or cleared by the [place]  FDA and has been authorized for detection and/or diagnosis of SARS-CoV-2 by FDA under an Emergency Use Authorization (EUA). This EUA will remain in effect (meaning this test can be used) for the duration of the COVID-19 declaration under Section 564(b)(1) of the Act, 21 U.S.C. section 360bbb-3(b)(1), unless the authorization is terminated or revoked.  Performed at [facility], [address] [place], [place] [address]   Culture, blood (Routine X 2) w Reflex to ID Panel     Status: Abnormal   Collection Time: 12/02/22 11:55 PM   Specimen: BLOOD  Result Value Ref  Range Status   Specimen Description BLOOD RIGHT ANTECUBITAL  Final   Special Requests   Final    BOTTLES DRAWN AEROBIC AND ANAEROBIC Blood Culture adequate volume   Culture  Setup Time   Final    GRAM POSITIVE COCCI IN CLUSTERS AEROBIC BOTTLE ONLY CRITICAL RESULT CALLED TO, READ BACK BY AND VERIFIED WITH: PHARMD EMMA PAYTES ON 12/03/22 @ 2003 BY DRT    Culture (A)  Final    STAPHYLOCOCCUS HOMINIS THE SIGNIFICANCE OF ISOLATING THIS ORGANISM FROM A SINGLE SET OF BLOOD CULTURES WHEN MULTIPLE SETS ARE DRAWN IS UNCERTAIN. PLEASE NOTIFY THE MICROBIOLOGY DEPARTMENT WITHIN ONE WEEK IF SPECIATION AND SENSITIVITIES ARE REQUIRED. Performed at [facility], [address] [place], [place] [address]    Report Status 12/04/2022 FINAL  Final  Blood Culture ID Panel (Reflexed)     Status: Abnormal   Collection Time: 12/02/22 11:55 PM  Result Value Ref Range Status   Enterococcus faecalis NOT DETECTED NOT DETECTED Final   Enterococcus Faecium NOT DETECTED NOT DETECTED Final   Listeria monocytogenes NOT DETECTED NOT DETECTED Final   Staphylococcus species DETECTED (A) NOT DETECTED Final    Comment: CRITICAL RESULT CALLED TO, READ BACK BY AND VERIFIED WITH: PHARMD EMMA PAYTES ON 12/03/22 @ 2003 BY DRT    Staphylococcus aureus (BCID) NOT DETECTED NOT DETECTED Final   Staphylococcus epidermidis NOT DETECTED NOT DETECTED Final   Staphylococcus lugdunensis NOT DETECTED NOT DETECTED Final   Streptococcus species NOT DETECTED NOT DETECTED Final   Streptococcus agalactiae NOT DETECTED NOT DETECTED Final   Streptococcus pneumoniae NOT DETECTED NOT DETECTED Final   Streptococcus pyogenes NOT DETECTED NOT DETECTED Final   A.calcoaceticus-baumannii NOT  DETECTED NOT DETECTED Final   Bacteroides fragilis NOT DETECTED NOT DETECTED Final   Enterobacterales NOT DETECTED NOT DETECTED Final   Enterobacter cloacae complex NOT DETECTED NOT DETECTED Final   Escherichia coli NOT DETECTED NOT DETECTED Final    Klebsiella aerogenes NOT DETECTED NOT DETECTED Final   Klebsiella oxytoca NOT DETECTED NOT DETECTED Final   Klebsiella pneumoniae NOT DETECTED NOT DETECTED Final   Proteus species NOT DETECTED NOT DETECTED Final   Salmonella species NOT DETECTED NOT DETECTED Final   Serratia marcescens NOT DETECTED NOT DETECTED Final   Haemophilus influenzae NOT DETECTED NOT DETECTED Final   Neisseria meningitidis NOT DETECTED NOT DETECTED Final   Pseudomonas aeruginosa NOT DETECTED NOT DETECTED Final   Stenotrophomonas maltophilia NOT DETECTED NOT DETECTED Final   Candida albicans NOT DETECTED NOT DETECTED Final   Candida auris NOT DETECTED NOT DETECTED Final   Candida glabrata NOT DETECTED NOT DETECTED Final   Candida krusei NOT DETECTED NOT DETECTED Final   Candida parapsilosis NOT DETECTED NOT DETECTED Final   Candida tropicalis NOT DETECTED NOT DETECTED Final   Cryptococcus neoformans/gattii NOT DETECTED NOT DETECTED Final    Comment: Performed at [facility], [address] [place], [place] [address]    Imaging reviewed:  CT Head Wo Contrast Result Date: 08/14/2023 CLINICAL DATA:  Head trauma, neck trauma, mental status change of unknown cause. EXAM: CT HEAD WITHOUT CONTRAST CT CERVICAL SPINE WITHOUT CONTRAST TECHNIQUE: Multidetector CT imaging of the head and cervical spine was performed following the standard protocol without intravenous contrast. Multiplanar CT image reconstructions of the cervical spine were also generated. RADIATION DOSE REDUCTION: This exam was performed according to the departmental dose-optimization program which includes automated exposure control, adjustment of the mA and/or kV according to patient size and/or use of iterative reconstruction technique. COMPARISON:  10/28/2020. FINDINGS: CT HEAD FINDINGS Brain: No acute intracranial hemorrhage. No CT evidence of acute infarct. Remote lacunar infarcts in the bilateral basal ganglia. Nonspecific hypoattenuation in the  periventricular and subcortical white matter favored to reflect chronic microvascular ischemic changes. No edema, mass effect, or midline shift. The basilar cisterns are patent. Ventricles: The ventricles are normal. Vascular: No hyperdense vessel or unexpected calcification. Skull: No acute or aggressive finding. Orbits: Orbits are symmetric. Sinuses: Mucosal thickening throughout the paranasal sinuses most pronounced in the sphenoid sinuses with possible small air-fluid levels. Other: Small left mastoid effusion. CT CERVICAL SPINE FINDINGS Alignment: Straightening of the normal cervical lordosis. No significant listhesis. No facet subluxation or dislocation. Skull base and vertebrae: Anterior cervical fusion hardware at C4-5. Similar chronic fusion extending from C2 to the upper thoracic spine. Diffuse osteopenia. No evidence of compression fracture or displaced fracture in the cervical spine. No destructive osseous lesion. Soft tissues and spinal canal: No prevertebral fluid or swelling. No visible canal hematoma. Disc levels: Osseous fusion across multiple disc levels throughout the cervical spine. Disc osteophyte complexes at multiple levels. No high-grade osseous spinal canal stenosis. Facet arthrosis throughout the cervical spine. Prominent degenerative changes at the atlanto dens articulation. Asymmetric degenerative changes at the right atlantoaxial articulation. Upper chest: Paraseptal and centrilobular emphysema. Other: None. IMPRESSION: No CT evidence of acute intracranial abnormality. No acute fracture or traumatic malalignment of the cervical spine. Mucosal thickening in the paranasal sinuses with air-fluid levels in the sphenoid sinuses. Recommend correlation for acute sinusitis. Chronic and degenerative changes as above. Electronically Signed   By: Donnice Mania M.D.   On: 08/14/2023 21:09   CT Cervical Spine Wo Contrast  Result Date: 08/14/2023 CLINICAL DATA:  Head trauma, neck trauma, mental  status change of unknown cause. EXAM: CT HEAD WITHOUT CONTRAST CT CERVICAL SPINE WITHOUT CONTRAST TECHNIQUE: Multidetector CT imaging of the head and cervical spine was performed following the standard protocol without intravenous contrast. Multiplanar CT image reconstructions of the cervical spine were also generated. RADIATION DOSE REDUCTION: This exam was performed according to the departmental dose-optimization program which includes automated exposure control, adjustment of the mA and/or kV according to patient size and/or use of iterative reconstruction technique. COMPARISON:  10/28/2020. FINDINGS: CT HEAD FINDINGS Brain: No acute intracranial hemorrhage. No CT evidence of acute infarct. Remote lacunar infarcts in the bilateral basal ganglia. Nonspecific hypoattenuation in the periventricular and subcortical white matter favored to reflect chronic microvascular ischemic changes. No edema, mass effect, or midline shift. The basilar cisterns are patent. Ventricles: The ventricles are normal. Vascular: No hyperdense vessel or unexpected calcification. Skull: No acute or aggressive finding. Orbits: Orbits are symmetric. Sinuses: Mucosal thickening throughout the paranasal sinuses most pronounced in the sphenoid sinuses with possible small air-fluid levels. Other: Small left mastoid effusion. CT CERVICAL SPINE FINDINGS Alignment: Straightening of the normal cervical lordosis. No significant listhesis. No facet subluxation or dislocation. Skull base and vertebrae: Anterior cervical fusion hardware at C4-5. Similar chronic fusion extending from C2 to the upper thoracic spine. Diffuse osteopenia. No evidence of compression fracture or displaced fracture in the cervical spine. No destructive osseous lesion. Soft tissues and spinal canal: No prevertebral fluid or swelling. No visible canal hematoma. Disc levels: Osseous fusion across multiple disc levels throughout the cervical spine. Disc osteophyte complexes at  multiple levels. No high-grade osseous spinal canal stenosis. Facet arthrosis throughout the cervical spine. Prominent degenerative changes at the atlanto dens articulation. Asymmetric degenerative changes at the right atlantoaxial articulation. Upper chest: Paraseptal and centrilobular emphysema. Other: None. IMPRESSION: No CT evidence of acute intracranial abnormality. No acute fracture or traumatic malalignment of the cervical spine. Mucosal thickening in the paranasal sinuses with air-fluid levels in the sphenoid sinuses. Recommend correlation for acute sinusitis. Chronic and degenerative changes as above. Electronically Signed   By: Donnice Mania M.D.   On: 08/14/2023 21:09   DG Tibia/Fibula Left Result Date: 08/14/2023 CLINICAL DATA:  Fall, leg pain EXAM: LEFT TIBIA AND FIBULA - 2 VIEW COMPARISON:  Knee series 10/28/2020 FINDINGS: Prior left knee replacement No acute bony abnormality. Specifically, no fracture, subluxation, or dislocation. Soft tissues are intact. IMPRESSION: No acute bony abnormality. Electronically Signed   By: Franky Crease M.D.   On: 08/14/2023 21:04   DG Foot 2 Views Left Result Date: 08/14/2023 CLINICAL DATA:  Fall, leg pain EXAM: LEFT FOOT - 2 VIEW COMPARISON:  None Available. FINDINGS: Plantar and posterior calcaneal spurs. No acute bony abnormality. Specifically, no fracture, subluxation, or dislocation. Joint space narrowing in the IP joints and 1st MTP joint. Soft tissues are intact. IMPRESSION: No acute bony abnormality. Electronically Signed   By: Franky Crease M.D.   On: 08/14/2023 21:03   CT Renal Stone Study Result Date: 08/14/2023 CLINICAL DATA:  Flank pain weakness EXAM: CT ABDOMEN AND PELVIS WITHOUT CONTRAST TECHNIQUE: Multidetector CT imaging of the abdomen and pelvis was performed following the standard protocol without IV contrast. RADIATION DOSE REDUCTION: This exam was performed according to the departmental dose-optimization program which includes automated  exposure control, adjustment of the mA and/or kV according to patient size and/or use of iterative reconstruction technique. COMPARISON:  CT 12/03/2022 FINDINGS: Lower chest: Lung bases demonstrate patchy  airspace disease at the left base. Dense right lower lobe airspace disease. Cardiomegaly with coronary vascular calcification Hepatobiliary: Gallstones. No focal hepatic abnormality or biliary dilatation Pancreas: Unremarkable. No pancreatic ductal dilatation or surrounding inflammatory changes. Spleen: Normal in size without focal abnormality. Adrenals/Urinary Tract: Adrenal glands are normal. Stable for left renal pelvis dilatation. There is dilatation of the mid left ureter with evidence for a 7 mm stone in the distal left ureter several cm proximal to the left UVJ. Multiple punctate right kidney stones. Multiple left-sided kidney stones, these measure up to 18 mm at the midpole. Bladder is unremarkable Stomach/Bowel: Stomach nonenlarged. No dilated small bowel. No acute bowel wall thickening. Negative appendix. Vascular/Lymphatic: Aortic atherosclerosis. No enlarged abdominal or pelvic lymph nodes. Reproductive: Prostate is unremarkable. Other: Negative for pelvic effusion or free air Musculoskeletal: No acute osseous abnormality. Diffuse ankylosis of the spine and SI joints. IMPRESSION: 1. 7 mm stone in the distal left ureter several cm proximal to the left UVJ with mild hydroureter of the upstream mid ureter. There is mild dilatation of left renal pelvis but stable as compared with CT from October and more decompressed appearance of the proximal left ureter between the renal pelvis and dilated ureteral segment. 2. Multiple bilateral kidney stones. 3. Gallstones. 4. Bibasilar airspace disease, right greater than left, suspicious for pneumonia and or aspiration. 5. Aortic atherosclerosis. Aortic Atherosclerosis (ICD10-I70.0). Electronically Signed   By: Luke Bun M.D.   On: 08/14/2023 20:29   DG Chest 2  View Result Date: 08/14/2023 CLINICAL DATA:  Shortness of breath EXAM: CHEST - 2 VIEW COMPARISON:  12/04/2022, 01/22/2022 FINDINGS: Lateral views are limited by patient's arms. Cardiomegaly with vascular congestion and mild diffuse interstitial opacity suggestive of mild edema. Suspect also small pleural effusions. IMPRESSION: Cardiomegaly with vascular congestion and mild interstitial edema. Suspect small pleural effusions. Electronically Signed   By: Luke Bun M.D.   On: 08/14/2023 18:06    EKG:  Personally reviewed, sinus rhythm, first-degree AV block, slight ST depression/biphasic T waves laterally.  ED Course:  Treated with ceftriaxone  for UTI.   Assessment/Plan:  83 y.o. male with hx CAD, HFpEF, COPD on 4 L O2, OSA on CPAP, ulcerative colitis, ankylosing spondylitis, hypertension, hyperlipidemia, CKD 3A, primary  hyperparathyroidism, morbid obesity, mood disorder, dementia, who presented with progressive fatigue, culminating in a ground-level fall.  Overall picture of failure to thrive with recent URI suspect viral illness, and also found to have urinary tract infection with left ureteral stone.  Failure to thrive Ground-level fall Progressive weakness over the past year, requiring more history with ADL, daughter does not feel she can safely care for him at home.  2 falls over the past 2 months, last yesterday.  Imaging including CT head, C-spine, chest x-ray, CT renal, x-ray of left foot and left tibia and fibula without acute injuries. - Physical therapy, Occupational Therapy - Nutrition evaluation, SLP evaluation for dysphagia - TOC assistance with SNF/LTC - Assist with feed, dysphagia 3 diet for now  Complicated urinary tract infection 7 mm distal left ureteral stone with upstream hydroureter Minimal urinary symptoms although malodorous urine reported by daughter.  UA suggestive of UTI with few bacteria, significant pyuria, no blood. Does not appear septic, afebrile, RR likely  driven by pulmonary process below. WBC 15. CT renal with 7 mm distal left ureteral stone several centimeter from the UVJ, with upstream mild hydroureter, stable dilation of the left renal pelvis compared to prior. - Discussed case with urology Dr. Elisabeth, does not  feel needs immediate ureteral stenting.  However recommending for urology consultation in the morning, please contact in AM.   -Continue ceftriaxone  1 g IV every 24 hours, follow-up urine culture  Community acquired pneumonia Suspect viral URI /LRI Daughter reports recent sick contact in family with cold-like symptoms.  He has had recent sinus congestion and conjunctival change with eye drainage, recent cough minimally productive.  On home O2.  Respiratory rate mid 20s.  Exam Rales in the right greater than left base.  Chest x-ray with vascular congestion and mild interstitial edema, small pleural effusion.  CT renal with right greater than left bibasilar airspace disease suggestive of pneumonia.  Despite chest x-ray read suspect interstitial findings may be related to atypical pneumonia.  Also has findings of sinusitis on CT head. - Continue ceftriaxone  per above.  Add azithromycin  500 mg daily - Check COVID, sputum culture - Albuterol  prn, incentive spirometer, flutter valve, encourage out of bed to chair.  Chronic medical problems: CAD: continue home aspirin . Continue home pravastatin .  Continue home ISMN twice daily, HFpEF, doubt acute exacerbation: Last TTE 12/' 23 LVEF 55 to 60%, moderate LVH, indeterminate diastolic dysfunction.  He does have relative increase in BNP to 171 and chest x-ray suggestive of mild interstitial edema.  However clinically appears dry with history of diminished oral intake and no peripheral edema on exam.  Would hold on diuresis for now.  Resume home torsemide  20 mg daily tomorrow. COPD, CHRF: On 4 L O2.  Currently actually only on 2 L.,  Nebs as needed OSA: CPAP nightly Ulcerative colitis, ankylosing  spondylitis: Noted not on disease modifying therapy. Hypertension: Continues home carvedilol , ISMN.  Temporary hold on his amlodipine , hydralazine , spironolactone  in the setting of infection. HLD: See CAD above. CKD 3A: Baseline creatinine near 1.7. Primary hyperparathyroidism: He has mild hypercalcemia at 58 which is similar to previous values.  If this is uptrending would give both hydration and IV Lasix  Morbid obesity: Having weight loss outpatient, follow Mood disorder: Continue home duloxetine ,  History of dementia: Noted, not on medication BPH: continue home Tamsulosin       There is no height or weight on file to calculate BMI.    DVT prophylaxis:  SCDs Code Status:  DNR/DNI(Do NOT Intubate); confirmed with patient and family  Diet:  Diet Orders (From admission, onward)     Start     Ordered   08/14/23 2154  Diet regular Room service appropriate? Yes; Fluid consistency: Thin  Diet effective now       Question Answer Comment  Room service appropriate? Yes   Fluid consistency: Thin      08/14/23 2158           Family Communication:  Yes discussed with daughter at bedside Consults: None Admission status:   Inpatient, Telemetry bed  Severity of Illness: The appropriate patient status for this patient is INPATIENT. Inpatient status is judged to be reasonable and necessary in order to provide the required intensity of service to ensure the patient's safety. The patient's presenting symptoms, physical exam findings, and initial radiographic and laboratory data in the context of their chronic comorbidities is felt to place them at high risk for further clinical deterioration. Furthermore, it is not anticipated that the patient will be medically stable for discharge from the hospital within 2 midnights of admission.   * I certify that at the point of admission it is my clinical judgment that the patient will require inpatient hospital care spanning beyond 2 midnights from  the  point of admission due to high intensity of service, high risk for further deterioration and high frequency of surveillance required.*   Dorn Dawson, MD [facility]  How to contact the [facility] Attending or Consulting provider 7A - 7P or covering provider during after hours 7P -7A, for this patient.  Check the care team in [facility] and look for a) attending/consulting [facility] provider listed and b) the [facility] team listed Log into [email or web address] and use [facility] universal password to access. If you do not have the password, please contact the hospital operator. Locate the [facility] provider you are looking for under [facility] and page to a number that you can be directly reached. If you still have difficulty reaching the provider, please page the [facility] (Director on Call) for the Hospitalists listed on amion for assistance.  08/14/2023, 10:55 PM

## 2023-08-14 NOTE — Telephone Encounter (Signed)
 Refill on DULoxetine  (CYMBALTA ) 60 MG capsule last filled 05/16/23 [facility]- [address]

## 2023-08-14 NOTE — ED Notes (Signed)
 Patient transported to CT

## 2023-08-14 NOTE — ED Provider Notes (Signed)
 [place] EMERGENCY DEPARTMENT AT [facility] Provider Note   CSN: [id number] Arrival date & time: 08/14/23  1623     Patient presents with: Weakness   [name] is a 83 y.o. male.   Patient is a 83 year old male who presents with weakness.  He has a history of CHF on oxygen  at 2 L/min, chronic kidney disease, prediabetes, hypertension.  He presents with general weakness over the last 2 to 3 days.  He says he lives at home and normally is able to ambulate with a walker.  He says he has been generally weak.  He has some increased shortness of breath.  No associated chest pain.  Does have a little bit of pain in his abdomen that he said started today.  Denies any vomiting or diarrhea.  No known fevers.  He has a little bit of cough and congestion.  He is coughing up some green sputum at times.  No urinary symptoms.       Prior to Admission medications   Medication Sig Start Date End Date Taking? Authorizing Provider  acetaminophen  (TYLENOL ) 500 MG tablet Take 2 tablets (1,000 mg total) by mouth every 6 (six) hours as needed for headache (pain). 11/24/20   Alphonsa Glendia LABOR, MD  albuterol  (VENTOLIN  HFA) 108 (90 Base) MCG/ACT inhaler Inhale 1-2 puffs into the lungs every 6 (six) hours as needed for wheezing or shortness of breath. 12/06/22   Amin, Ankit C, MD  amLODipine  (NORVASC ) 5 MG tablet TAKE 1 TABLET BY MOUTH DAILY 04/01/23   Alphonsa Glendia LABOR, MD  ammonium lactate  (AMLACTIN DAILY) 12 % lotion Apply 1 Application topically as needed for dry skin. 02/28/23   Lamount Ethan CROME, DPM  aspirin  EC 81 MG tablet Take 81 mg by mouth daily. Swallow whole.    [provider]  carvedilol  (COREG ) 12.5 MG tablet Take 1 tablet (12.5 mg total) by mouth 2 (two) times daily with a meal. 03/22/23   Alphonsa, Glendia LABOR, MD  clobetasol (TEMOVATE) 0.05 % external solution  06/20/23   [provider]  DULoxetine  (CYMBALTA ) 60 MG capsule TAKE 1 CAPSULE BY MOUTH DAILY 08/14/23   Alphonsa Glendia LABOR, MD  hydrALAZINE  (APRESOLINE ) 25 MG tablet 25 mg one bid Patient taking differently: Take 25 mg by mouth 2 (two) times daily. 25 mg one bid 04/22/23   Alphonsa Glendia LABOR, MD  isosorbide  mononitrate (IMDUR ) 30 MG 24 hr tablet TAKE 2 TABLETS(60 MG) BY MOUTH DAILY 06/06/23   Alphonsa Glendia LABOR, MD  mupirocin  ointment (BACTROBAN ) 2 % Apply thin amount bid prn 12/21/22   Luking, Glendia LABOR, MD  nitroGLYCERIN  (NITROSTAT ) 0.4 MG SL tablet Place 1 tablet (0.4 mg total) under the tongue every 5 (five) minutes as needed for chest pain. 02/26/23   Alphonsa Glendia LABOR, MD  OVER THE COUNTER MEDICATION Oxygen  4 LPM    [provider]  pravastatin  (PRAVACHOL ) 40 MG tablet 1 qd 06/06/23   Alphonsa Glendia LABOR, MD  spironolactone  (ALDACTONE ) 25 MG tablet Take 0.5 tablets (12.5 mg total) by mouth daily. 06/06/23   Alphonsa Glendia LABOR, MD  tamsulosin  (FLOMAX ) 0.4 MG CAPS capsule Take 1 capsule (0.4 mg total) by mouth daily. 06/06/23   Alphonsa Glendia LABOR, MD  torsemide  (DEMADEX ) 20 MG tablet 1 qam and may take 2 pill every day prn if weight gain greater than 3 pounds in a week 06/06/23   Luking, Scott A, MD  Vitamin D , Ergocalciferol , (DRISDOL ) 1.25 MG (50000 UNIT) CAPS  capsule TAKE 1 CAPSULE BY MOUTH EVERY 7 DAYS 05/17/23   Alphonsa Glendia LABOR, MD    Allergies: Patient has no known allergies.    Review of Systems  Constitutional:  Positive for fatigue. Negative for chills, diaphoresis and fever.  HENT:  Negative for congestion, rhinorrhea and sneezing.   Eyes: Negative.   Respiratory:  Positive for cough and shortness of breath. Negative for chest tightness.   Cardiovascular:  Positive for leg swelling. Negative for chest pain.  Gastrointestinal:  Negative for abdominal pain, blood in stool, diarrhea, nausea and vomiting.  Genitourinary:  Negative for difficulty urinating, flank pain, frequency and hematuria.  Musculoskeletal:  Negative for arthralgias and back pain.  Skin:  Negative for rash.  Neurological:  Positive for weakness  (Generalized). Negative for dizziness, speech difficulty, numbness and headaches.    Updated Vital Signs BP (!) 118/48   Pulse (!) 58   Temp 98 F (36.7 C) (Oral)   Resp (!) 31   SpO2 100%   Physical Exam Constitutional:      Appearance: He is well-developed.  HENT:     Head: Normocephalic and atraumatic.  Eyes:     Pupils: Pupils are equal, round, and reactive to light.  Cardiovascular:     Rate and Rhythm: Normal rate and regular rhythm.     Heart sounds: Normal heart sounds.  Pulmonary:     Effort: Pulmonary effort is normal. No respiratory distress.     Breath sounds: Normal breath sounds. No wheezing or rales.  Chest:     Chest wall: No tenderness.  Abdominal:     General: Bowel sounds are normal.     Palpations: Abdomen is soft.     Tenderness: There is no abdominal tenderness. There is no guarding or rebound.  Musculoskeletal:        General: Normal range of motion.     Cervical back: Normal range of motion and neck supple.     Comments: 3+ pitting edema to lower extremities bilaterally  Lymphadenopathy:     Cervical: No cervical adenopathy.  Skin:    General: Skin is warm and dry.     Findings: No rash.  Neurological:     General: No focal deficit present.     Mental Status: He is alert and oriented to person, place, and time.     (all labs ordered are listed, but only abnormal results are displayed) Labs Reviewed  BRAIN NATRIURETIC PEPTIDE - Abnormal; Notable for the following components:      Result Value   B Natriuretic Peptide 171.5 (*)    All other components within normal limits  COMPREHENSIVE METABOLIC PANEL WITH GFR - Abnormal; Notable for the following components:   Sodium 133 (*)    Glucose, Bld 135 (*)    BUN 31 (*)    Creatinine, Ser 1.82 (*)    Calcium 11.0 (*)    Albumin  2.5 (*)    GFR, Estimated 36 (*)    All other components within normal limits  CBC WITH DIFFERENTIAL/PLATELET - Abnormal; Notable for the following components:   WBC  15.3 (*)    RBC 3.78 (*)    Hemoglobin 11.3 (*)    HCT 34.7 (*)    Neutro Abs 9.9 (*)    Monocytes Absolute 1.9 (*)    Abs Immature Granulocytes 0.08 (*)    All other components within normal limits  URINALYSIS, W/ REFLEX TO CULTURE (INFECTION SUSPECTED) - Abnormal; Notable for the following components:   APPearance  HAZY (*)    Hgb urine dipstick SMALL (*)    Protein, ur 30 (*)    Leukocytes,Ua LARGE (*)    Bacteria, UA FEW (*)    All other components within normal limits  I-STAT CHEM 8, ED - Abnormal; Notable for the following components:   Sodium 133 (*)    BUN 32 (*)    Creatinine, Ser 1.80 (*)    Glucose, Bld 135 (*)    Calcium, Ion 1.49 (*)    Hemoglobin 11.9 (*)    HCT 35.0 (*)    All other components within normal limits  RESPIRATORY PANEL BY PCR  URINE CULTURE  CULTURE, BLOOD (ROUTINE X 2)  CULTURE, BLOOD (ROUTINE X 2)  SARS CORONAVIRUS 2 BY RT PCR  BASIC METABOLIC PANEL WITH GFR  CBC  MAGNESIUM   PHOSPHORUS  TSH  I-STAT CG4 LACTIC ACID, ED  I-STAT CG4 LACTIC ACID, ED  TROPONIN I (HIGH SENSITIVITY)  TROPONIN I (HIGH SENSITIVITY)    EKG: None  Radiology: CT Head Wo Contrast Result Date: 08/14/2023 CLINICAL DATA:  Head trauma, neck trauma, mental status change of unknown cause. EXAM: CT HEAD WITHOUT CONTRAST CT CERVICAL SPINE WITHOUT CONTRAST TECHNIQUE: Multidetector CT imaging of the head and cervical spine was performed following the standard protocol without intravenous contrast. Multiplanar CT image reconstructions of the cervical spine were also generated. RADIATION DOSE REDUCTION: This exam was performed according to the departmental dose-optimization program which includes automated exposure control, adjustment of the mA and/or kV according to patient size and/or use of iterative reconstruction technique. COMPARISON:  10/28/2020. FINDINGS: CT HEAD FINDINGS Brain: No acute intracranial hemorrhage. No CT evidence of acute infarct. Remote lacunar infarcts in the  bilateral basal ganglia. Nonspecific hypoattenuation in the periventricular and subcortical white matter favored to reflect chronic microvascular ischemic changes. No edema, mass effect, or midline shift. The basilar cisterns are patent. Ventricles: The ventricles are normal. Vascular: No hyperdense vessel or unexpected calcification. Skull: No acute or aggressive finding. Orbits: Orbits are symmetric. Sinuses: Mucosal thickening throughout the paranasal sinuses most pronounced in the sphenoid sinuses with possible small air-fluid levels. Other: Small left mastoid effusion. CT CERVICAL SPINE FINDINGS Alignment: Straightening of the normal cervical lordosis. No significant listhesis. No facet subluxation or dislocation. Skull base and vertebrae: Anterior cervical fusion hardware at C4-5. Similar chronic fusion extending from C2 to the upper thoracic spine. Diffuse osteopenia. No evidence of compression fracture or displaced fracture in the cervical spine. No destructive osseous lesion. Soft tissues and spinal canal: No prevertebral fluid or swelling. No visible canal hematoma. Disc levels: Osseous fusion across multiple disc levels throughout the cervical spine. Disc osteophyte complexes at multiple levels. No high-grade osseous spinal canal stenosis. Facet arthrosis throughout the cervical spine. Prominent degenerative changes at the atlanto dens articulation. Asymmetric degenerative changes at the right atlantoaxial articulation. Upper chest: Paraseptal and centrilobular emphysema. Other: None. IMPRESSION: No CT evidence of acute intracranial abnormality. No acute fracture or traumatic malalignment of the cervical spine. Mucosal thickening in the paranasal sinuses with air-fluid levels in the sphenoid sinuses. Recommend correlation for acute sinusitis. Chronic and degenerative changes as above. Electronically Signed   By: Donnice Mania M.D.   On: 08/14/2023 21:09   CT Cervical Spine Wo Contrast Result Date:  08/14/2023 CLINICAL DATA:  Head trauma, neck trauma, mental status change of unknown cause. EXAM: CT HEAD WITHOUT CONTRAST CT CERVICAL SPINE WITHOUT CONTRAST TECHNIQUE: Multidetector CT imaging of the head and cervical spine was performed following the standard protocol  without intravenous contrast. Multiplanar CT image reconstructions of the cervical spine were also generated. RADIATION DOSE REDUCTION: This exam was performed according to the departmental dose-optimization program which includes automated exposure control, adjustment of the mA and/or kV according to patient size and/or use of iterative reconstruction technique. COMPARISON:  10/28/2020. FINDINGS: CT HEAD FINDINGS Brain: No acute intracranial hemorrhage. No CT evidence of acute infarct. Remote lacunar infarcts in the bilateral basal ganglia. Nonspecific hypoattenuation in the periventricular and subcortical white matter favored to reflect chronic microvascular ischemic changes. No edema, mass effect, or midline shift. The basilar cisterns are patent. Ventricles: The ventricles are normal. Vascular: No hyperdense vessel or unexpected calcification. Skull: No acute or aggressive finding. Orbits: Orbits are symmetric. Sinuses: Mucosal thickening throughout the paranasal sinuses most pronounced in the sphenoid sinuses with possible small air-fluid levels. Other: Small left mastoid effusion. CT CERVICAL SPINE FINDINGS Alignment: Straightening of the normal cervical lordosis. No significant listhesis. No facet subluxation or dislocation. Skull base and vertebrae: Anterior cervical fusion hardware at C4-5. Similar chronic fusion extending from C2 to the upper thoracic spine. Diffuse osteopenia. No evidence of compression fracture or displaced fracture in the cervical spine. No destructive osseous lesion. Soft tissues and spinal canal: No prevertebral fluid or swelling. No visible canal hematoma. Disc levels: Osseous fusion across multiple disc levels  throughout the cervical spine. Disc osteophyte complexes at multiple levels. No high-grade osseous spinal canal stenosis. Facet arthrosis throughout the cervical spine. Prominent degenerative changes at the atlanto dens articulation. Asymmetric degenerative changes at the right atlantoaxial articulation. Upper chest: Paraseptal and centrilobular emphysema. Other: None. IMPRESSION: No CT evidence of acute intracranial abnormality. No acute fracture or traumatic malalignment of the cervical spine. Mucosal thickening in the paranasal sinuses with air-fluid levels in the sphenoid sinuses. Recommend correlation for acute sinusitis. Chronic and degenerative changes as above. Electronically Signed   By: Donnice Mania M.D.   On: 08/14/2023 21:09   DG Tibia/Fibula Left Result Date: 08/14/2023 CLINICAL DATA:  Fall, leg pain EXAM: LEFT TIBIA AND FIBULA - 2 VIEW COMPARISON:  Knee series 10/28/2020 FINDINGS: Prior left knee replacement No acute bony abnormality. Specifically, no fracture, subluxation, or dislocation. Soft tissues are intact. IMPRESSION: No acute bony abnormality. Electronically Signed   By: Franky Crease M.D.   On: 08/14/2023 21:04   DG Foot 2 Views Left Result Date: 08/14/2023 CLINICAL DATA:  Fall, leg pain EXAM: LEFT FOOT - 2 VIEW COMPARISON:  None Available. FINDINGS: Plantar and posterior calcaneal spurs. No acute bony abnormality. Specifically, no fracture, subluxation, or dislocation. Joint space narrowing in the IP joints and 1st MTP joint. Soft tissues are intact. IMPRESSION: No acute bony abnormality. Electronically Signed   By: Franky Crease M.D.   On: 08/14/2023 21:03   CT Renal Stone Study Result Date: 08/14/2023 CLINICAL DATA:  Flank pain weakness EXAM: CT ABDOMEN AND PELVIS WITHOUT CONTRAST TECHNIQUE: Multidetector CT imaging of the abdomen and pelvis was performed following the standard protocol without IV contrast. RADIATION DOSE REDUCTION: This exam was performed according to the  departmental dose-optimization program which includes automated exposure control, adjustment of the mA and/or kV according to patient size and/or use of iterative reconstruction technique. COMPARISON:  CT 12/03/2022 FINDINGS: Lower chest: Lung bases demonstrate patchy airspace disease at the left base. Dense right lower lobe airspace disease. Cardiomegaly with coronary vascular calcification Hepatobiliary: Gallstones. No focal hepatic abnormality or biliary dilatation Pancreas: Unremarkable. No pancreatic ductal dilatation or surrounding inflammatory changes. Spleen: Normal in size without focal abnormality.  Adrenals/Urinary Tract: Adrenal glands are normal. Stable for left renal pelvis dilatation. There is dilatation of the mid left ureter with evidence for a 7 mm stone in the distal left ureter several cm proximal to the left UVJ. Multiple punctate right kidney stones. Multiple left-sided kidney stones, these measure up to 18 mm at the midpole. Bladder is unremarkable Stomach/Bowel: Stomach nonenlarged. No dilated small bowel. No acute bowel wall thickening. Negative appendix. Vascular/Lymphatic: Aortic atherosclerosis. No enlarged abdominal or pelvic lymph nodes. Reproductive: Prostate is unremarkable. Other: Negative for pelvic effusion or free air Musculoskeletal: No acute osseous abnormality. Diffuse ankylosis of the spine and SI joints. IMPRESSION: 1. 7 mm stone in the distal left ureter several cm proximal to the left UVJ with mild hydroureter of the upstream mid ureter. There is mild dilatation of left renal pelvis but stable as compared with CT from October and more decompressed appearance of the proximal left ureter between the renal pelvis and dilated ureteral segment. 2. Multiple bilateral kidney stones. 3. Gallstones. 4. Bibasilar airspace disease, right greater than left, suspicious for pneumonia and or aspiration. 5. Aortic atherosclerosis. Aortic Atherosclerosis (ICD10-I70.0). Electronically Signed    By: Luke Bun M.D.   On: 08/14/2023 20:29   DG Chest 2 View Result Date: 08/14/2023 CLINICAL DATA:  Shortness of breath EXAM: CHEST - 2 VIEW COMPARISON:  12/04/2022, 01/22/2022 FINDINGS: Lateral views are limited by patient's arms. Cardiomegaly with vascular congestion and mild diffuse interstitial opacity suggestive of mild edema. Suspect also small pleural effusions. IMPRESSION: Cardiomegaly with vascular congestion and mild interstitial edema. Suspect small pleural effusions. Electronically Signed   By: Luke Bun M.D.   On: 08/14/2023 18:06     Procedures   Medications Ordered in the ED  sodium chloride  flush (NS) 0.9 % injection 3 mL (3 mLs Intravenous Given 08/14/23 2237)  acetaminophen  (TYLENOL ) tablet 1,000 mg (has no administration in time range)  albuterol  (PROVENTIL ) (2.5 MG/3ML) 0.083% nebulizer solution 2.5 mg (has no administration in time range)  melatonin tablet 6 mg (has no administration in time range)  ondansetron  (ZOFRAN ) injection 4 mg (has no administration in time range)  polyethylene glycol (MIRALAX  / GLYCOLAX ) packet 17 g (has no administration in time range)  aspirin  EC tablet 81 mg (has no administration in time range)  carvedilol  (COREG ) tablet 12.5 mg (has no administration in time range)  DULoxetine  (CYMBALTA ) DR capsule 60 mg (has no administration in time range)  isosorbide  mononitrate (IMDUR ) 24 hr tablet 30 mg (has no administration in time range)  pravastatin  (PRAVACHOL ) tablet 40 mg (has no administration in time range)  tamsulosin  (FLOMAX ) capsule 0.4 mg (has no administration in time range)  torsemide  (DEMADEX ) tablet 20 mg (has no administration in time range)  azithromycin  (ZITHROMAX ) tablet 500 mg (has no administration in time range)  cefTRIAXone  (ROCEPHIN ) 1 g in sodium chloride  0.9 % 100 mL IVPB (has no administration in time range)  cefTRIAXone  (ROCEPHIN ) 1 g in sodium chloride  0.9 % 100 mL IVPB (0 g Intravenous Stopped 08/14/23 2342)                                     Medical Decision Making Amount and/or Complexity of Data Reviewed Labs: ordered. Radiology: ordered.  Risk Decision regarding hospitalization.   Patient is a 83 year old who presents with generalized weakness.  His family has had some mild URI symptoms and he has had some similar symptoms.  His chest x-ray does not show any evidence of pneumonia.  This was interpreted by me and confirmed by the radiologist.  His labs overall look okay.  However his urine does show some suggestions of infection.  He was started on IV antibiotics.  Urine culture was sent.  Discussed with Dr. Segars who will admit the patient for further treatment.  Differential diagnosis, stroke, infection, sepsis, ACS, dehydration, URI     Final diagnoses:  Acute cystitis without hematuria  Weakness    ED Discharge Orders     None          Lenor Hollering, MD 08/14/23 2355

## 2023-08-14 NOTE — ED Notes (Signed)
 Patient transported to X-ray

## 2023-08-14 NOTE — ED Triage Notes (Signed)
 Pt to the ed from home where he lives with family. Family called ems for generalized weakness with SOB. Pt has chf and is on 2 lpm [place] at home. Pt is alert and oriented x2 and is at baseline per ems.

## 2023-08-15 ENCOUNTER — Other Ambulatory Visit: Payer: Self-pay

## 2023-08-15 ENCOUNTER — Encounter (HOSPITAL_COMMUNITY): Payer: Self-pay | Admitting: Internal Medicine

## 2023-08-15 ENCOUNTER — Other Ambulatory Visit: Payer: Self-pay | Admitting: Family Medicine

## 2023-08-15 DIAGNOSIS — N390 Urinary tract infection, site not specified: Secondary | ICD-10-CM | POA: Diagnosis not present

## 2023-08-15 LAB — BASIC METABOLIC PANEL WITH GFR
Anion gap: 7 (ref 5–15)
BUN: 35 mg/dL — ABNORMAL HIGH (ref 8–23)
CO2: 25 mmol/L (ref 22–32)
Calcium: 11.1 mg/dL — ABNORMAL HIGH (ref 8.9–10.3)
Chloride: 102 mmol/L (ref 98–111)
Creatinine, Ser: 1.59 mg/dL — ABNORMAL HIGH (ref 0.61–1.24)
GFR, Estimated: 43 mL/min — ABNORMAL LOW (ref >60)
Glucose, Bld: 114 mg/dL — ABNORMAL HIGH (ref 70–99)
Potassium: 4.1 mmol/L (ref 3.5–5.1)
Sodium: 134 mmol/L — ABNORMAL LOW (ref 135–145)

## 2023-08-15 LAB — CBC
HCT: 35.3 % — ABNORMAL LOW (ref 39.0–52.0)
Hemoglobin: 11.5 g/dL — ABNORMAL LOW (ref 13.0–17.0)
MCH: 29.8 pg (ref 26.0–34.0)
MCHC: 32.6 g/dL (ref 30.0–36.0)
MCV: 91.5 fL (ref 80.0–100.0)
Platelets: 284 K/uL (ref 150–400)
RBC: 3.86 MIL/uL — ABNORMAL LOW (ref 4.22–5.81)
RDW: 12.4 % (ref 11.5–15.5)
WBC: 12.8 K/uL — ABNORMAL HIGH (ref 4.0–10.5)
nRBC: 0 % (ref 0.0–0.2)

## 2023-08-15 LAB — PHOSPHORUS: Phosphorus: 3 mg/dL (ref 2.5–4.6)

## 2023-08-15 LAB — TSH: TSH: 1.853 u[IU]/mL (ref 0.350–4.500)

## 2023-08-15 LAB — MAGNESIUM: Magnesium: 2.1 mg/dL (ref 1.7–2.4)

## 2023-08-15 NOTE — [place] FL2 (Signed)
 [place]  MEDICAID FL2 LEVEL OF CARE FORM     IDENTIFICATION  Patient Name: [name] Birthdate: 1940/10/30 Sex: male Admission Date (Current Location): 08/14/2023  [facility] and [place] Number:  Producer, television/film/video and Address:  [facility] [place], [address] [place], [place] [address]      Provider Number: [id number]  Attending Physician Name and Address:  [name], MD  Relative Name and Phone Number:       Current Level of Care: Hospital Recommended Level of Care: Skilled Nursing Facility Prior Approval Number:    Date Approved/Denied:   PASRR Number: [id number]  Discharge Plan: SNF    Current Diagnoses: Patient Active Problem List   Diagnosis Date Noted   Complicated UTI (urinary tract infection) 08/14/2023   Failure to thrive in adult 08/14/2023   Osteopenia 05/23/2022   Vitamin D  deficiency 05/23/2022   Other hyperparathyroidism ([name]) 05/08/2022   Stage 3a chronic kidney disease (CKD) ([name]) 01/19/2022   Depression, major, single episode, moderate ([name]) 02/20/2021   Status post cervical spinal fusion 07/19/2020   Cervical spondylosis 03/07/2020   DNR (do not resuscitate) 08/17/2019   COPD (chronic obstructive pulmonary disease) ([name]) 04/09/2019   Chronic respiratory failure with hypoxia ([name]) 04/09/2019   Obesity with body mass index (BMI) of 30.0 to 39.9 04/09/2019   Ankylosing spondylitis ([name]) 11/07/2018   Chronic heart failure with preserved ejection fraction (HFpEF) ([name]) 03/27/2018   Morbid obesity ([name]) 03/12/2018   History of carpal tunnel surgery of left wrist with unlar head resection 01/17/17 01/24/2017   Hx of skin cancer, basal cell 11/30/2015   Erectile dysfunction 06/06/2015   Iron deficiency 08/18/2014   Essential hypertension 01/09/2013   Dyslipidemia, goal LDL below 70 01/09/2013   CAD (coronary artery disease) 12/14/2012   Hiatal hernia 12/14/2012   GERD (gastroesophageal reflux disease)  12/14/2012   Prediabetes 12/09/2012   BPH (benign prostatic hyperplasia) 11/18/2012   CAP (community acquired pneumonia) 05/09/2012   OSA treated with BiPAP 04/30/2012   Ulcerative colitis ([name]) 04/29/2012    Orientation RESPIRATION BLADDER Height & Weight     Time, Situation, Place  O2 Incontinent Weight:   Height:  6' 1 (185.4 cm)  BEHAVIORAL SYMPTOMS/MOOD NEUROLOGICAL BOWEL NUTRITION STATUS      Continent Diet (See DC Summary)  AMBULATORY STATUS COMMUNICATION OF NEEDS Skin   Extensive Assist Verbally Normal                       Personal Care Assistance Level of Assistance  Bathing, Feeding, Dressing Bathing Assistance: Maximum assistance Feeding assistance: Limited assistance Dressing Assistance: Maximum assistance     Functional Limitations Info  Sight, Hearing, Speech Sight Info: Impaired Hearing Info: Impaired Speech Info: Adequate    SPECIAL CARE FACTORS FREQUENCY  PT (By licensed PT), OT (By licensed OT)     PT Frequency: 5x/week OT Frequency: 5x/week            Contractures Contractures Info: Not present    Additional Factors Info  Code Status, Allergies Code Status Info: DNR-Limited Allergies Info: No Known Allergies           Current Medications (08/15/2023):  This is the current hospital active medication list Current Facility-Administered Medications  Medication Dose Route Frequency Provider Last Rate Last Admin   acetaminophen  (TYLENOL ) tablet 1,000 mg  1,000 mg Oral Q6H PRN Segars, Dorn, MD       albuterol  (PROVENTIL ) (2.5 MG/3ML) 0.083% nebulizer solution  2.5 mg  2.5 mg Nebulization Q4H PRN Segars, Jonathan, MD       aspirin  EC tablet 81 mg  81 mg Oral Daily Segars, Jonathan, MD   81 mg at 08/15/23 9143   azithromycin  (ZITHROMAX ) tablet 500 mg  500 mg Oral QHS Segars, Jonathan, MD   500 mg at 08/15/23 0016   carvedilol  (COREG ) tablet 12.5 mg  12.5 mg Oral BID WC Segars, Jonathan, MD   12.5 mg at 08/15/23 0856   cefTRIAXone   (ROCEPHIN ) 1 g in sodium chloride  0.9 % 100 mL IVPB  1 g Intravenous Q24H Segars, Jonathan, MD       DULoxetine  (CYMBALTA ) DR capsule 60 mg  60 mg Oral Daily Segars, Jonathan, MD   60 mg at 08/15/23 9143   isosorbide  mononitrate (IMDUR ) 24 hr tablet 30 mg  30 mg Oral Daily Segars, Dorn, MD   30 mg at 08/15/23 0856   melatonin tablet 6 mg  6 mg Oral QHS PRN Segars, Jonathan, MD       ondansetron  (ZOFRAN ) injection 4 mg  4 mg Intravenous Q6H PRN Segars, Dorn, MD       polyethylene glycol (MIRALAX  / GLYCOLAX ) packet 17 g  17 g Oral Daily PRN Segars, Dorn, MD       pravastatin  (PRAVACHOL ) tablet 40 mg  40 mg Oral Daily Segars, Dorn, MD   40 mg at 08/15/23 0856   sodium chloride  flush (NS) 0.9 % injection 3 mL  3 mL Intravenous Q12H Segars, Jonathan, MD   3 mL at 08/14/23 2237   tamsulosin  (FLOMAX ) capsule 0.4 mg  0.4 mg Oral Daily Segars, Jonathan, MD   0.4 mg at 08/15/23 0856   torsemide  (DEMADEX ) tablet 20 mg  20 mg Oral Daily Segars, Jonathan, MD   20 mg at 08/15/23 9142     Discharge Medications: Please see discharge summary for a list of discharge medications.  Relevant Imaging Results:  Relevant Lab Results:   Additional Information SSN: [id number]  Jeoffrey LITTIE Moose, LCSW

## 2023-08-15 NOTE — Plan of Care (Addendum)
 Family arrived and fed patient food left in room after n.p.o. status.  Unfortunately we must cancel his case.  Urology attempting to find additional physician/OR availability.

## 2023-08-15 NOTE — Consult Note (Signed)
 Urology Consult Note   Requesting Attending Physician:  [name], MD Service Providing Consult: Urology  Consulting Attending: Dr. Nieves   Reason for Consult:  ureteral stone  HPI: [name] is seen in consultation for reasons noted above at the request of [name], MD. Patient is an 83 year old male known to our practice.  He was remotely followed by Dr. Watt for overactive bladder, last seen on 04/09/2017.  PMH significant for CAD, HFpEF, COPD with chronic hypoxia-4LNC O2, OSA on CPAP, UC, ankylosing spondylitis, HTN, HLD, CKD 3A, primary hyperparathyroidism, morbid obesity, mood disorder, and dementia.  Patient presents to [facility] emergency department via EMS for progressive fatigue resulting in a ground-level fall.  Daughter reported diminished oral intake and unintended weight loss from 317 to 274 pounds over the last year.   On my arrival patient was just attempting to get up with physical therapy and was in the process of tipping over while attempting to stand with PT.  I assisted him back in bed.  He was generally a poor historian.  He was unaware that he had a kidney stone and reported that he had no flank pain.  Attempted to contact both daughters listed on his facesheet multiple times but was sent directly to voicemail.  ------------------  Assessment:   83 y.o. male with left ureteral stone  Recommendations: #left ureteral stone #CKD  CT A/P notes 7 mm distal left ureteral stone.  Trend labs, serum creatinine at baseline  Last ate yesterday, remain n.p.o.  Urinalysis with few bacteria, could be representative of UTI but overwhelmingly unimpressive.  No fever, hemodynamically stable, no signs of systemic infectious process  To the OR with Dr. Nieves for cystoscopy, left retrograde pyelogram, left laser lithotripsy, and left ureteral stent placement.  Tentatively 3 PM    Case and plan discussed with Dr. Nieves  Past Medical  History: Past Medical History:  Diagnosis Date   Ankylosing spondylitis ([name]) 11/07/2018   Asthma    BPH (benign prostatic hyperplasia)    CAD (coronary artery disease) 01/1995   CHF (congestive heart failure) ([name])    Clostridium difficile colitis 04/2005   Colitis 2011   COPD (chronic obstructive pulmonary disease) ([name])    Depression    Diverticulosis    DJD (degenerative joint disease)    Fracture, [place], left, closed 11/29/2016   Gastric ulcer 04/17/2010   Three 5mm gastric ulcers, H.pylori serologies were negative   GERD (gastroesophageal reflux disease)    History of kidney stones    Hyperlipidemia    Hypertension    Idiopathic chronic inflammatory bowel disease 05/18/2010   left-sided UC   Kidney stone    Morbid obesity ([name]) 03/12/2018   Obstructive sleep apnea    on Cpap   Reflux 02/1995   S/P endoscopy 07/24/2010   retained gastric contents, benign bx    Past Surgical History:  Past Surgical History:  Procedure Laterality Date   ANORECTAL MANOMETRY  2016   [facility]: concern for possible fissure. Noted pelvic floor dyssnyergy   BIOPSY  07/29/2017   Procedure: BIOPSY;  Surgeon: Shaaron Lamar HERO, MD;  Location: [facility];  Service: Endoscopy;;  ascending and sigmoid colon   CARDIAC CATHETERIZATION     with stent   CARDIOVASCULAR STRESS TEST  07/21/2009   No scintigraphic evidence of inducible myocardial ischemia   CARPAL TUNNEL RELEASE Left 01/17/2017   Procedure: LEFT CARPAL TUNNEL RELEASE;  Surgeon: Margrette Taft BRAVO, MD;  Location: [facility];  Service: Orthopedics;  Laterality: Left;   CATARACT EXTRACTION, BILATERAL Bilateral    CERVICAL SPINE SURGERY     C4-5   COLONOSCOPY  04/2005   granularity and friability erosions from rectum to 40cm. Bx infection vs IBD. C. Diff positive at the time.    COLONOSCOPY  05/2010   [facility]: left-sided UC, bx with no dysplasia, shallow diverticula   COLONOSCOPY [name] 11/07/2012   MFM:[id number] proctocolitis status post  segmental biopsy/Sigmoid colon polyps removed as described above. Procedure compromisd by technical difficulties. bx: Inflammation limited to sigmoid and rectum on pathology.   COLONOSCOPY  04/2014   Dr. Landy at [facility]: well localized proctocolitis limited to sigmoid   COLONOSCOPY WITH PROPOFOL  [name] 07/29/2017   diverticulosis in colon, three 4-6 mm polyps at splenic flexure and in cecum, one 10 mm polyp in rectum, abnormal rectum and sigmoid consistent with active UC   CORONARY STENT PLACEMENT  01/1995   ESOPHAGEAL DILATION  01/20/2022   Procedure: ESOPHAGEAL DILATION;  Surgeon: Elicia Claw, MD;  Location: [facility];  Service: Gastroenterology;;   ESOPHAGOGASTRODUODENOSCOPY  07/24/2010   MFM:Wnmfjo esophagus   ESOPHAGOGASTRODUODENOSCOPY  04/2014   Dr. Landy at [facility]: negative small bowel biopsies   ESOPHAGOGASTRODUODENOSCOPY (EGD) WITH PROPOFOL  [name] 01/20/2022   Procedure: ESOPHAGOGASTRODUODENOSCOPY (EGD) WITH PROPOFOL ;  Surgeon: Elicia Claw, MD;  Location: [facility];  Service: Gastroenterology;  Laterality: [name];   FOOT SURGERY Bilateral two   KNEE SURGERY  two   POLYPECTOMY  07/29/2017   Procedure: POLYPECTOMY;  Surgeon: Shaaron Lamar HERO, MD;  Location: [facility];  Service: Endoscopy;;  splenic flexure, ascending colon polyp;rectal   SHOULDER SURGERY  two   TONSILLECTOMY     TRANSTHORACIC ECHOCARDIOGRAM  03/23/2009   EF 60-65%, normal LV systolic function   ULNAR HEAD EXCISION Left 01/17/2017   Procedure: LEFT ULNAR HEAD RESECTION;  Surgeon: Margrette Taft BRAVO, MD;  Location: [facility];  Service: Orthopedics;  Laterality: Left;    Medication: Current Facility-Administered Medications  Medication Dose Route Frequency Provider Last Rate Last Admin   acetaminophen  (TYLENOL ) tablet 1,000 mg  1,000 mg Oral Q6H PRN Segars, Dorn, MD       albuterol  (PROVENTIL ) (2.5 MG/3ML) 0.083% nebulizer solution 2.5 mg  2.5 mg Nebulization Q4H PRN Segars, Dorn, MD       aspirin   EC tablet 81 mg  81 mg Oral Daily Segars, Dorn, MD   81 mg at 08/15/23 0856   azithromycin  (ZITHROMAX ) tablet 500 mg  500 mg Oral QHS Segars, Jonathan, MD   500 mg at 08/15/23 0016   carvedilol  (COREG ) tablet 12.5 mg  12.5 mg Oral BID WC Segars, Jonathan, MD   12.5 mg at 08/15/23 0856   cefTRIAXone  (ROCEPHIN ) 1 g in sodium chloride  0.9 % 100 mL IVPB  1 g Intravenous Q24H Segars, Dorn, MD       DULoxetine  (CYMBALTA ) DR capsule 60 mg  60 mg Oral Daily Segars, Jonathan, MD   60 mg at 08/15/23 9143   isosorbide  mononitrate (IMDUR ) 24 hr tablet 30 mg  30 mg Oral Daily Segars, Dorn, MD   30 mg at 08/15/23 0856   melatonin tablet 6 mg  6 mg Oral QHS PRN Segars, Jonathan, MD       ondansetron  (ZOFRAN ) injection 4 mg  4 mg Intravenous Q6H PRN Segars, Dorn, MD       polyethylene glycol (MIRALAX  / GLYCOLAX ) packet 17 g  17 g Oral Daily PRN Keturah Dorn, MD       pravastatin  (PRAVACHOL )  tablet 40 mg  40 mg Oral Daily Segars, Jonathan, MD   40 mg at 08/15/23 9143   sodium chloride  flush (NS) 0.9 % injection 3 mL  3 mL Intravenous Q12H Segars, Jonathan, MD   3 mL at 08/14/23 2237   tamsulosin  (FLOMAX ) capsule 0.4 mg  0.4 mg Oral Daily Segars, Jonathan, MD   0.4 mg at 08/15/23 9143   torsemide  (DEMADEX ) tablet 20 mg  20 mg Oral Daily Segars, Jonathan, MD   20 mg at 08/15/23 0857    Allergies: No Known Allergies  Social History: Social History   Tobacco Use   Smoking status: Former    Current packs/day: 0.00    Average packs/day: 1 pack/day for 20.0 years (20.0 ttl pk-yrs)    Types: Cigarettes    Start date: 09/30/1949    Quit date: 09/30/1969    Years since quitting: 53.9    Passive exposure: Past   Smokeless tobacco: Never   Tobacco comments:    Verified by Daughter, [name].  Vaping Use   Vaping status: Never Used  Substance Use Topics   Alcohol use: No   Drug use: No    Family History Family History  Problem Relation Age of Onset   Lung cancer Mother    Cancer  Mother        breast   Diabetes Mother    Stroke Father    Hypertension Father    Hyperlipidemia Father    Kidney failure Brother    Other Child        blood infection   Colon cancer Neg Hx     Review of Systems  Unable to perform ROS: Mental acuity     Objective   Vital signs in last 24 hours: BP (!) 124/46 (BP Location: Left Arm)   Pulse 61   Temp 98.5 F (36.9 C)   Resp 18   Ht 6' 1 (1.854 m)   SpO2 94%   BMI 38.00 kg/m   Physical Exam General: Alert and confused HEENT: [place]/AT Pulmonary: Normal work of breathing Cardiovascular: no cyanosis Abdomen: Soft, NTTP, nondistended GU:  Neuro: Appropriate, no focal neurological deficits  Most Recent Labs: Lab Results  Component Value Date   WBC 12.8 (H) 08/15/2023   HGB 11.5 (L) 08/15/2023   HCT 35.3 (L) 08/15/2023   PLT 284 08/15/2023    Lab Results  Component Value Date   NA 134 (L) 08/15/2023   K 4.1 08/15/2023   CL 102 08/15/2023   CO2 25 08/15/2023   BUN 35 (H) 08/15/2023   CREATININE 1.59 (H) 08/15/2023   CALCIUM 11.1 (H) 08/15/2023   MG 2.1 08/15/2023   PHOS 3.0 08/15/2023    Lab Results  Component Value Date   INR 1.0 12/02/2022   APTT 42 (H) 04/08/2019     Urine Culture: @[id number] (laburin,org,[id number])@   IMAGING: CT Head Wo Contrast Result Date: 08/14/2023 CLINICAL DATA:  Head trauma, neck trauma, mental status change of unknown cause. EXAM: CT HEAD WITHOUT CONTRAST CT CERVICAL SPINE WITHOUT CONTRAST TECHNIQUE: Multidetector CT imaging of the head and cervical spine was performed following the standard protocol without intravenous contrast. Multiplanar CT image reconstructions of the cervical spine were also generated. RADIATION DOSE REDUCTION: This exam was performed according to the departmental dose-optimization program which includes automated exposure control, adjustment of the mA and/or kV according to patient size and/or use of iterative reconstruction technique. COMPARISON:   10/28/2020. FINDINGS: CT HEAD FINDINGS Brain: No acute intracranial hemorrhage. No CT  evidence of acute infarct. Remote lacunar infarcts in the bilateral basal ganglia. Nonspecific hypoattenuation in the periventricular and subcortical white matter favored to reflect chronic microvascular ischemic changes. No edema, mass effect, or midline shift. The basilar cisterns are patent. Ventricles: The ventricles are normal. Vascular: No hyperdense vessel or unexpected calcification. Skull: No acute or aggressive finding. Orbits: Orbits are symmetric. Sinuses: Mucosal thickening throughout the paranasal sinuses most pronounced in the sphenoid sinuses with possible small air-fluid levels. Other: Small left mastoid effusion. CT CERVICAL SPINE FINDINGS Alignment: Straightening of the normal cervical lordosis. No significant listhesis. No facet subluxation or dislocation. Skull base and vertebrae: Anterior cervical fusion hardware at C4-5. Similar chronic fusion extending from C2 to the upper thoracic spine. Diffuse osteopenia. No evidence of compression fracture or displaced fracture in the cervical spine. No destructive osseous lesion. Soft tissues and spinal canal: No prevertebral fluid or swelling. No visible canal hematoma. Disc levels: Osseous fusion across multiple disc levels throughout the cervical spine. Disc osteophyte complexes at multiple levels. No high-grade osseous spinal canal stenosis. Facet arthrosis throughout the cervical spine. Prominent degenerative changes at the atlanto dens articulation. Asymmetric degenerative changes at the right atlantoaxial articulation. Upper chest: Paraseptal and centrilobular emphysema. Other: None. IMPRESSION: No CT evidence of acute intracranial abnormality. No acute fracture or traumatic malalignment of the cervical spine. Mucosal thickening in the paranasal sinuses with air-fluid levels in the sphenoid sinuses. Recommend correlation for acute sinusitis. Chronic and  degenerative changes as above. Electronically Signed   By: Donnice Mania M.D.   On: 08/14/2023 21:09   CT Cervical Spine Wo Contrast Result Date: 08/14/2023 CLINICAL DATA:  Head trauma, neck trauma, mental status change of unknown cause. EXAM: CT HEAD WITHOUT CONTRAST CT CERVICAL SPINE WITHOUT CONTRAST TECHNIQUE: Multidetector CT imaging of the head and cervical spine was performed following the standard protocol without intravenous contrast. Multiplanar CT image reconstructions of the cervical spine were also generated. RADIATION DOSE REDUCTION: This exam was performed according to the departmental dose-optimization program which includes automated exposure control, adjustment of the mA and/or kV according to patient size and/or use of iterative reconstruction technique. COMPARISON:  10/28/2020. FINDINGS: CT HEAD FINDINGS Brain: No acute intracranial hemorrhage. No CT evidence of acute infarct. Remote lacunar infarcts in the bilateral basal ganglia. Nonspecific hypoattenuation in the periventricular and subcortical white matter favored to reflect chronic microvascular ischemic changes. No edema, mass effect, or midline shift. The basilar cisterns are patent. Ventricles: The ventricles are normal. Vascular: No hyperdense vessel or unexpected calcification. Skull: No acute or aggressive finding. Orbits: Orbits are symmetric. Sinuses: Mucosal thickening throughout the paranasal sinuses most pronounced in the sphenoid sinuses with possible small air-fluid levels. Other: Small left mastoid effusion. CT CERVICAL SPINE FINDINGS Alignment: Straightening of the normal cervical lordosis. No significant listhesis. No facet subluxation or dislocation. Skull base and vertebrae: Anterior cervical fusion hardware at C4-5. Similar chronic fusion extending from C2 to the upper thoracic spine. Diffuse osteopenia. No evidence of compression fracture or displaced fracture in the cervical spine. No destructive osseous lesion. Soft  tissues and spinal canal: No prevertebral fluid or swelling. No visible canal hematoma. Disc levels: Osseous fusion across multiple disc levels throughout the cervical spine. Disc osteophyte complexes at multiple levels. No high-grade osseous spinal canal stenosis. Facet arthrosis throughout the cervical spine. Prominent degenerative changes at the atlanto dens articulation. Asymmetric degenerative changes at the right atlantoaxial articulation. Upper chest: Paraseptal and centrilobular emphysema. Other: None. IMPRESSION: No CT evidence of acute intracranial abnormality. No  acute fracture or traumatic malalignment of the cervical spine. Mucosal thickening in the paranasal sinuses with air-fluid levels in the sphenoid sinuses. Recommend correlation for acute sinusitis. Chronic and degenerative changes as above. Electronically Signed   By: Donnice Mania M.D.   On: 08/14/2023 21:09   DG Tibia/Fibula Left Result Date: 08/14/2023 CLINICAL DATA:  Fall, leg pain EXAM: LEFT TIBIA AND FIBULA - 2 VIEW COMPARISON:  Knee series 10/28/2020 FINDINGS: Prior left knee replacement No acute bony abnormality. Specifically, no fracture, subluxation, or dislocation. Soft tissues are intact. IMPRESSION: No acute bony abnormality. Electronically Signed   By: Franky Crease M.D.   On: 08/14/2023 21:04   DG Foot 2 Views Left Result Date: 08/14/2023 CLINICAL DATA:  Fall, leg pain EXAM: LEFT FOOT - 2 VIEW COMPARISON:  None Available. FINDINGS: Plantar and posterior calcaneal spurs. No acute bony abnormality. Specifically, no fracture, subluxation, or dislocation. Joint space narrowing in the IP joints and 1st MTP joint. Soft tissues are intact. IMPRESSION: No acute bony abnormality. Electronically Signed   By: Franky Crease M.D.   On: 08/14/2023 21:03   CT Renal Stone Study Result Date: 08/14/2023 CLINICAL DATA:  Flank pain weakness EXAM: CT ABDOMEN AND PELVIS WITHOUT CONTRAST TECHNIQUE: Multidetector CT imaging of the abdomen and pelvis  was performed following the standard protocol without IV contrast. RADIATION DOSE REDUCTION: This exam was performed according to the departmental dose-optimization program which includes automated exposure control, adjustment of the mA and/or kV according to patient size and/or use of iterative reconstruction technique. COMPARISON:  CT 12/03/2022 FINDINGS: Lower chest: Lung bases demonstrate patchy airspace disease at the left base. Dense right lower lobe airspace disease. Cardiomegaly with coronary vascular calcification Hepatobiliary: Gallstones. No focal hepatic abnormality or biliary dilatation Pancreas: Unremarkable. No pancreatic ductal dilatation or surrounding inflammatory changes. Spleen: Normal in size without focal abnormality. Adrenals/Urinary Tract: Adrenal glands are normal. Stable for left renal pelvis dilatation. There is dilatation of the mid left ureter with evidence for a 7 mm stone in the distal left ureter several cm proximal to the left UVJ. Multiple punctate right kidney stones. Multiple left-sided kidney stones, these measure up to 18 mm at the midpole. Bladder is unremarkable Stomach/Bowel: Stomach nonenlarged. No dilated small bowel. No acute bowel wall thickening. Negative appendix. Vascular/Lymphatic: Aortic atherosclerosis. No enlarged abdominal or pelvic lymph nodes. Reproductive: Prostate is unremarkable. Other: Negative for pelvic effusion or free air Musculoskeletal: No acute osseous abnormality. Diffuse ankylosis of the spine and SI joints. IMPRESSION: 1. 7 mm stone in the distal left ureter several cm proximal to the left UVJ with mild hydroureter of the upstream mid ureter. There is mild dilatation of left renal pelvis but stable as compared with CT from October and more decompressed appearance of the proximal left ureter between the renal pelvis and dilated ureteral segment. 2. Multiple bilateral kidney stones. 3. Gallstones. 4. Bibasilar airspace disease, right greater than  left, suspicious for pneumonia and or aspiration. 5. Aortic atherosclerosis. Aortic Atherosclerosis (ICD10-I70.0). Electronically Signed   By: Luke Bun M.D.   On: 08/14/2023 20:29   DG Chest 2 View Result Date: 08/14/2023 CLINICAL DATA:  Shortness of breath EXAM: CHEST - 2 VIEW COMPARISON:  12/04/2022, 01/22/2022 FINDINGS: Lateral views are limited by patient's arms. Cardiomegaly with vascular congestion and mild diffuse interstitial opacity suggestive of mild edema. Suspect also small pleural effusions. IMPRESSION: Cardiomegaly with vascular congestion and mild interstitial edema. Suspect small pleural effusions. Electronically Signed   By: Luke Bun HERO.D.  On: 08/14/2023 18:06    ------  Ole Bourdon, NP Pager: [phone]   Please contact the urology consult pager with any further questions/concerns.

## 2023-08-15 NOTE — Progress Notes (Signed)
 PROGRESS NOTE    [name]  FMW:[id number] DOB: 1940/09/27 DOA: 08/14/2023 PCP: Alphonsa Glendia LABOR, MD   Brief Narrative:  [name] is a 83 y.o. male with hx of CAD, HFpEF, COPD on 4 L O2, OSA on BiPAP, ulcerative colitis, ankylosing spondylitis, hypertension, hyperlipidemia, CKD 3A, primary  hyperparathyroidism, morbid obesity, mood disorder, dementia, who presented with progressive fatigue, culminating in a ground-level fall.  Assessment & Plan:   Principal Problem:   Complicated UTI (urinary tract infection) Active Problems:   CAP (community acquired pneumonia)   Failure to thrive in adult  Failure to thrive Ambulatory dysfunction, acute on chronic - ground-level fall Rule out overt dysphagia -Progressive weakness over the past year with multiple falls, imaging negative at this time - PT OT to follow, appreciate insight recommendations - Speech to evaluate given what appears to be progressive dysphagia now to both solids and liquids   Complicated urinary tract infection 7 mm distal left ureteral stone with hydroureter - Malodorous urine with notable bacteria on UA and pyuria - Continue ceftriaxone  x 3 days, follow culture - Urology following, appreciate insight recommendations, possible cystoscope later today per their schedule   Chronic hypoxic respiratory failure  Rule out aspiration pneumonitis versus possible underlying community-acquired pneumonia  -Viral panel negative  - Recent sick contacts in the family however no indication for antivirals given timing and negative respiratory PCR - Treat empirically with ceftriaxone  in setting of above with azithromycin  x 5 days - Patient is without acute hypoxia but has known chronic hypoxia on 4 L nasal cannula with BiPAP at night  CAD: continue home aspirin . Continue home pravastatin .  Continue home isosorbide  twice daily,  HFpEF, without acute exacerbation:  - Most recent echo December 2023: LVEF 55 to 60%,  moderate LVH, indeterminate diastolic dysfunction.  - Minimally elevated BNP -Fluids given at intake, will resume home p.o. diuretics in the next 24 to 48 hours pending volume status and renal recovery  COPD: On 4 L O2 at home.  Continue supportive care, nebs  OSA: BiPAP nightly  Ulcerative colitis, ankylosing spondylitis: Not on active therapy  Hypertension: Continue carvedilol  ; hold on his amlodipine , hydralazine , spironolactone   HLD: Continue statin   CKD 3A: Baseline creatinine near 1.7.  Primary hyperparathyroidism: He has mild hypercalcemia at 74 which is similar to previous values.  If this is uptrending would give both hydration and IV Lasix   Morbid obesity: Reports losing weight in the outpatient setting, continue to trend Mood disorder: Continue home duloxetine  History of dementia: Not on any active therapy, unspecified type BPH: continue home Tamsulosin    DVT prophylaxis: SCDs Start: 08/14/23 2154 Code Status:   Code Status: Limited: Do not attempt resuscitation (DNR) -DNR-LIMITED -Do Not Intubate/DNI  Family Communication: None present  Status is: Inpatient  Dispo: The patient is from: Home              Anticipated d/c is to: Home              Anticipated d/c date is: 24 to 48 hours              Patient currently not medically stable for discharge  Consultants:  Urology  Procedures:  Potential cystoscopy 7/10  Antimicrobials:  Ceftriaxone  azithromycin   Subjective: No acute issues or events overnight, somnolent this morning but easily arousable denies nausea vomiting diarrhea constipation any fevers chills or chest pain  Objective: Vitals:   08/15/23 0140 08/15/23 0526 08/15/23 0826 08/15/23 1119  BP:  ROLLEN)  142/51 (!) 132/47 (!) 124/46  Pulse:  63 63 61  Resp:  19 19 18   Temp:  98.8 F (37.1 C) 99.6 F (37.6 C) 98.5 F (36.9 C)  TempSrc:      SpO2:  91% 90% 94%  Height: 6' 1 (1.854 m)       Intake/Output Summary (Last 24 hours) at 08/15/2023  1426 Last data filed at 08/15/2023 0524 Gross per 24 hour  Intake 110 ml  Output 400 ml  Net -290 ml   There were no vitals filed for this visit.  Examination:  General:  Pleasantly resting in bed, No acute distress.  Somnolent but easily arousable HEENT:  Normocephalic atraumatic.  Sclerae nonicteric, noninjected.  Extraocular movements intact bilaterally. Neck:  Without mass or deformity.  Trachea is midline. Lungs:  Clear to auscultate bilaterally without rhonchi, wheeze, or rales. Heart:  Regular rate and rhythm.  Without murmurs, rubs, or gallops. Abdomen:  Soft, nontender, obese, nondistended.  Without guarding or rebound. Extremities: Without cyanosis, clubbing, edema, or obvious deformity. Skin:  Warm and dry, no erythema.  Data Reviewed: I have personally reviewed following labs and imaging studies  CBC: Recent Labs  Lab 08/14/23 1641 08/14/23 1653 08/15/23 0455  WBC 15.3*  --  12.8*  NEUTROABS 9.9*  --   --   HGB 11.3* 11.9* 11.5*  HCT 34.7* 35.0* 35.3*  MCV 91.8  --  91.5  PLT 291  --  284   Basic Metabolic Panel: Recent Labs  Lab 08/14/23 1641 08/14/23 1653 08/15/23 0455  NA 133* 133* 134*  K 4.2 4.2 4.1  CL 101 102 102  CO2 22  --  25  GLUCOSE 135* 135* 114*  BUN 31* 32* 35*  CREATININE 1.82* 1.80* 1.59*  CALCIUM 11.0*  --  11.1*  MG  --   --  2.1  PHOS  --   --  3.0   GFR: CrCl cannot be calculated (Unknown ideal weight.). Liver Function Tests: Recent Labs  Lab 08/14/23 1641  AST 30  ALT 23  ALKPHOS 73  BILITOT 1.0  PROT 7.0  ALBUMIN  2.5*   Thyroid  Function Tests: Recent Labs    08/15/23 0455  TSH 1.853   Sepsis Labs: Recent Labs  Lab 08/14/23 1654 08/14/23 1844  LATICACIDVEN 1.0 0.8    Recent Results (from the past 240 hours)  Respiratory (~20 pathogens) panel by PCR     Status: None   Collection Time: 08/14/23  8:15 PM   Specimen: Urine, Clean Catch; Respiratory  Result Value Ref Range Status   Adenovirus NOT DETECTED  NOT DETECTED Final   Coronavirus 229E NOT DETECTED NOT DETECTED Final    Comment: (NOTE) The Coronavirus on the Respiratory Panel, DOES NOT test for the novel  Coronavirus (2019 nCoV)    Coronavirus HKU1 NOT DETECTED NOT DETECTED Final   Coronavirus NL63 NOT DETECTED NOT DETECTED Final   Coronavirus OC43 NOT DETECTED NOT DETECTED Final   Metapneumovirus NOT DETECTED NOT DETECTED Final   Rhinovirus / Enterovirus NOT DETECTED NOT DETECTED Final   Influenza A NOT DETECTED NOT DETECTED Final   Influenza B NOT DETECTED NOT DETECTED Final   Parainfluenza Virus 1 NOT DETECTED NOT DETECTED Final   Parainfluenza Virus 2 NOT DETECTED NOT DETECTED Final   Parainfluenza Virus 3 NOT DETECTED NOT DETECTED Final   Parainfluenza Virus 4 NOT DETECTED NOT DETECTED Final   Respiratory Syncytial Virus NOT DETECTED NOT DETECTED Final   Bordetella pertussis NOT DETECTED NOT DETECTED  Final   Bordetella Parapertussis NOT DETECTED NOT DETECTED Final   Chlamydophila pneumoniae NOT DETECTED NOT DETECTED Final   Mycoplasma pneumoniae NOT DETECTED NOT DETECTED Final    Comment: Performed at [facility], [address], [place], [place] [address]  Culture, blood (Routine X 2) w Reflex to ID Panel     Status: None (Preliminary result)   Collection Time: 08/14/23 10:12 PM   Specimen: BLOOD RIGHT HAND  Result Value Ref Range Status   Specimen Description BLOOD RIGHT HAND  Final   Special Requests   Final    BOTTLES DRAWN AEROBIC AND ANAEROBIC Blood Culture adequate volume   Culture   Final    NO GROWTH < 12 HOURS Performed at [facility], [address], [place], [place] [address]    Report Status PENDING  Incomplete  Culture, blood (Routine X 2) w Reflex to ID Panel     Status: None (Preliminary result)   Collection Time: 08/14/23 10:12 PM   Specimen: BLOOD LEFT HAND  Result Value Ref Range Status   Specimen Description BLOOD LEFT HAND  Final   Special Requests   Final    BOTTLES DRAWN AEROBIC  AND ANAEROBIC Blood Culture adequate volume   Culture   Final    NO GROWTH < 12 HOURS Performed at [facility], [address] [place], [place] [address]    Report Status PENDING  Incomplete         Radiology Studies: CT Head Wo Contrast Result Date: 08/14/2023 CLINICAL DATA:  Head trauma, neck trauma, mental status change of unknown cause. EXAM: CT HEAD WITHOUT CONTRAST CT CERVICAL SPINE WITHOUT CONTRAST TECHNIQUE: Multidetector CT imaging of the head and cervical spine was performed following the standard protocol without intravenous contrast. Multiplanar CT image reconstructions of the cervical spine were also generated. RADIATION DOSE REDUCTION: This exam was performed according to the departmental dose-optimization program which includes automated exposure control, adjustment of the mA and/or kV according to patient size and/or use of iterative reconstruction technique. COMPARISON:  10/28/2020. FINDINGS: CT HEAD FINDINGS Brain: No acute intracranial hemorrhage. No CT evidence of acute infarct. Remote lacunar infarcts in the bilateral basal ganglia. Nonspecific hypoattenuation in the periventricular and subcortical white matter favored to reflect chronic microvascular ischemic changes. No edema, mass effect, or midline shift. The basilar cisterns are patent. Ventricles: The ventricles are normal. Vascular: No hyperdense vessel or unexpected calcification. Skull: No acute or aggressive finding. Orbits: Orbits are symmetric. Sinuses: Mucosal thickening throughout the paranasal sinuses most pronounced in the sphenoid sinuses with possible small air-fluid levels. Other: Small left mastoid effusion. CT CERVICAL SPINE FINDINGS Alignment: Straightening of the normal cervical lordosis. No significant listhesis. No facet subluxation or dislocation. Skull base and vertebrae: Anterior cervical fusion hardware at C4-5. Similar chronic fusion extending from C2 to the upper thoracic spine. Diffuse  osteopenia. No evidence of compression fracture or displaced fracture in the cervical spine. No destructive osseous lesion. Soft tissues and spinal canal: No prevertebral fluid or swelling. No visible canal hematoma. Disc levels: Osseous fusion across multiple disc levels throughout the cervical spine. Disc osteophyte complexes at multiple levels. No high-grade osseous spinal canal stenosis. Facet arthrosis throughout the cervical spine. Prominent degenerative changes at the atlanto dens articulation. Asymmetric degenerative changes at the right atlantoaxial articulation. Upper chest: Paraseptal and centrilobular emphysema. Other: None. IMPRESSION: No CT evidence of acute intracranial abnormality. No acute fracture or traumatic malalignment of the cervical spine. Mucosal thickening in the  paranasal sinuses with air-fluid levels in the sphenoid sinuses. Recommend correlation for acute sinusitis. Chronic and degenerative changes as above. Electronically Signed   By: Donnice Mania M.D.   On: 08/14/2023 21:09   CT Cervical Spine Wo Contrast Result Date: 08/14/2023 CLINICAL DATA:  Head trauma, neck trauma, mental status change of unknown cause. EXAM: CT HEAD WITHOUT CONTRAST CT CERVICAL SPINE WITHOUT CONTRAST TECHNIQUE: Multidetector CT imaging of the head and cervical spine was performed following the standard protocol without intravenous contrast. Multiplanar CT image reconstructions of the cervical spine were also generated. RADIATION DOSE REDUCTION: This exam was performed according to the departmental dose-optimization program which includes automated exposure control, adjustment of the mA and/or kV according to patient size and/or use of iterative reconstruction technique. COMPARISON:  10/28/2020. FINDINGS: CT HEAD FINDINGS Brain: No acute intracranial hemorrhage. No CT evidence of acute infarct. Remote lacunar infarcts in the bilateral basal ganglia. Nonspecific hypoattenuation in the periventricular and  subcortical white matter favored to reflect chronic microvascular ischemic changes. No edema, mass effect, or midline shift. The basilar cisterns are patent. Ventricles: The ventricles are normal. Vascular: No hyperdense vessel or unexpected calcification. Skull: No acute or aggressive finding. Orbits: Orbits are symmetric. Sinuses: Mucosal thickening throughout the paranasal sinuses most pronounced in the sphenoid sinuses with possible small air-fluid levels. Other: Small left mastoid effusion. CT CERVICAL SPINE FINDINGS Alignment: Straightening of the normal cervical lordosis. No significant listhesis. No facet subluxation or dislocation. Skull base and vertebrae: Anterior cervical fusion hardware at C4-5. Similar chronic fusion extending from C2 to the upper thoracic spine. Diffuse osteopenia. No evidence of compression fracture or displaced fracture in the cervical spine. No destructive osseous lesion. Soft tissues and spinal canal: No prevertebral fluid or swelling. No visible canal hematoma. Disc levels: Osseous fusion across multiple disc levels throughout the cervical spine. Disc osteophyte complexes at multiple levels. No high-grade osseous spinal canal stenosis. Facet arthrosis throughout the cervical spine. Prominent degenerative changes at the atlanto dens articulation. Asymmetric degenerative changes at the right atlantoaxial articulation. Upper chest: Paraseptal and centrilobular emphysema. Other: None. IMPRESSION: No CT evidence of acute intracranial abnormality. No acute fracture or traumatic malalignment of the cervical spine. Mucosal thickening in the paranasal sinuses with air-fluid levels in the sphenoid sinuses. Recommend correlation for acute sinusitis. Chronic and degenerative changes as above. Electronically Signed   By: Donnice Mania M.D.   On: 08/14/2023 21:09   DG Tibia/Fibula Left Result Date: 08/14/2023 CLINICAL DATA:  Fall, leg pain EXAM: LEFT TIBIA AND FIBULA - 2 VIEW COMPARISON:   Knee series 10/28/2020 FINDINGS: Prior left knee replacement No acute bony abnormality. Specifically, no fracture, subluxation, or dislocation. Soft tissues are intact. IMPRESSION: No acute bony abnormality. Electronically Signed   By: Franky Crease M.D.   On: 08/14/2023 21:04   DG Foot 2 Views Left Result Date: 08/14/2023 CLINICAL DATA:  Fall, leg pain EXAM: LEFT FOOT - 2 VIEW COMPARISON:  None Available. FINDINGS: Plantar and posterior calcaneal spurs. No acute bony abnormality. Specifically, no fracture, subluxation, or dislocation. Joint space narrowing in the IP joints and 1st MTP joint. Soft tissues are intact. IMPRESSION: No acute bony abnormality. Electronically Signed   By: Franky Crease M.D.   On: 08/14/2023 21:03   CT Renal Stone Study Result Date: 08/14/2023 CLINICAL DATA:  Flank pain weakness EXAM: CT ABDOMEN AND PELVIS WITHOUT CONTRAST TECHNIQUE: Multidetector CT imaging of the abdomen and pelvis was performed following the standard protocol without IV contrast. RADIATION DOSE REDUCTION: This exam  was performed according to the departmental dose-optimization program which includes automated exposure control, adjustment of the mA and/or kV according to patient size and/or use of iterative reconstruction technique. COMPARISON:  CT 12/03/2022 FINDINGS: Lower chest: Lung bases demonstrate patchy airspace disease at the left base. Dense right lower lobe airspace disease. Cardiomegaly with coronary vascular calcification Hepatobiliary: Gallstones. No focal hepatic abnormality or biliary dilatation Pancreas: Unremarkable. No pancreatic ductal dilatation or surrounding inflammatory changes. Spleen: Normal in size without focal abnormality. Adrenals/Urinary Tract: Adrenal glands are normal. Stable for left renal pelvis dilatation. There is dilatation of the mid left ureter with evidence for a 7 mm stone in the distal left ureter several cm proximal to the left UVJ. Multiple punctate right kidney stones.  Multiple left-sided kidney stones, these measure up to 18 mm at the midpole. Bladder is unremarkable Stomach/Bowel: Stomach nonenlarged. No dilated small bowel. No acute bowel wall thickening. Negative appendix. Vascular/Lymphatic: Aortic atherosclerosis. No enlarged abdominal or pelvic lymph nodes. Reproductive: Prostate is unremarkable. Other: Negative for pelvic effusion or free air Musculoskeletal: No acute osseous abnormality. Diffuse ankylosis of the spine and SI joints. IMPRESSION: 1. 7 mm stone in the distal left ureter several cm proximal to the left UVJ with mild hydroureter of the upstream mid ureter. There is mild dilatation of left renal pelvis but stable as compared with CT from October and more decompressed appearance of the proximal left ureter between the renal pelvis and dilated ureteral segment. 2. Multiple bilateral kidney stones. 3. Gallstones. 4. Bibasilar airspace disease, right greater than left, suspicious for pneumonia and or aspiration. 5. Aortic atherosclerosis. Aortic Atherosclerosis (ICD10-I70.0). Electronically Signed   By: Luke Bun M.D.   On: 08/14/2023 20:29   DG Chest 2 View Result Date: 08/14/2023 CLINICAL DATA:  Shortness of breath EXAM: CHEST - 2 VIEW COMPARISON:  12/04/2022, 01/22/2022 FINDINGS: Lateral views are limited by patient's arms. Cardiomegaly with vascular congestion and mild diffuse interstitial opacity suggestive of mild edema. Suspect also small pleural effusions. IMPRESSION: Cardiomegaly with vascular congestion and mild interstitial edema. Suspect small pleural effusions. Electronically Signed   By: Luke Bun M.D.   On: 08/14/2023 18:06    Scheduled Meds:  aspirin  EC  81 mg Oral Daily   azithromycin   500 mg Oral QHS   carvedilol   12.5 mg Oral BID WC   DULoxetine   60 mg Oral Daily   isosorbide  mononitrate  30 mg Oral Daily   pravastatin   40 mg Oral Daily   sodium chloride  flush  3 mL Intravenous Q12H   tamsulosin   0.4 mg Oral Daily    torsemide   20 mg Oral Daily   Continuous Infusions:  cefTRIAXone  (ROCEPHIN )  IV       LOS: 1 day   Time spent:  Elsie JAYSON Montclair, DO [facility]  If 7PM-7AM, please contact night-coverage [email or web address]  08/15/2023, 2:26 PM

## 2023-08-15 NOTE — H&P (View-Only) (Signed)
 Urology Consult Note   Requesting Attending Physician:  Chris Elsie BROCKS, MD Service Providing Consult: Urology  Consulting Attending: Dr. Nieves   Reason for Consult:  ureteral stone  HPI: [name] is seen in consultation for reasons noted above at the request of Chris Elsie BROCKS, MD. Patient is an 83 year old male known to our practice.  He was remotely followed by Dr. Watt for overactive bladder, last seen on 04/09/2017.  PMH significant for CAD, HFpEF, COPD with chronic hypoxia-4LNC O2, OSA on CPAP, UC, ankylosing spondylitis, HTN, HLD, CKD 3A, primary hyperparathyroidism, morbid obesity, mood disorder, and dementia.  Patient presents to [facility] emergency department via EMS for progressive fatigue resulting in a ground-level fall.  Daughter reported diminished oral intake and unintended weight loss from 317 to 274 pounds over the last year.   On my arrival patient was just attempting to get up with physical therapy and was in the process of tipping over while attempting to stand with PT.  I assisted him back in bed.  He was generally a poor historian.  He was unaware that he had a kidney stone and reported that he had no flank pain.  Attempted to contact both daughters listed on his facesheet multiple times but was sent directly to voicemail.  ------------------  Assessment:   83 y.o. male with left ureteral stone  Recommendations: #left ureteral stone #CKD  CT A/P notes 7 mm distal left ureteral stone.  Trend labs, serum creatinine at baseline  Last ate yesterday, remain n.p.o.  Urinalysis with few bacteria, could be representative of UTI but overwhelmingly unimpressive.  No fever, hemodynamically stable, no signs of systemic infectious process  To the OR with Dr. Nieves for cystoscopy, left retrograde pyelogram, left laser lithotripsy, and left ureteral stent placement.  Tentatively 3 PM    Case and plan discussed with Dr. Nieves  Past Medical  History: Past Medical History:  Diagnosis Date   Ankylosing spondylitis ([name]) 11/07/2018   Asthma    BPH (benign prostatic hyperplasia)    CAD (coronary artery disease) 01/1995   CHF (congestive heart failure) ([name])    Clostridium difficile colitis 04/2005   Colitis 2011   COPD (chronic obstructive pulmonary disease) ([name])    Depression    Diverticulosis    DJD (degenerative joint disease)    Fracture, [place], left, closed 11/29/2016   Gastric ulcer 04/17/2010   Three 5mm gastric ulcers, H.pylori serologies were negative   GERD (gastroesophageal reflux disease)    History of kidney stones    Hyperlipidemia    Hypertension    Idiopathic chronic inflammatory bowel disease 05/18/2010   left-sided UC   Kidney stone    Morbid obesity ([name]) 03/12/2018   Obstructive sleep apnea    on Cpap   Reflux 02/1995   S/P endoscopy 07/24/2010   retained gastric contents, benign bx    Past Surgical History:  Past Surgical History:  Procedure Laterality Date   ANORECTAL MANOMETRY  2016   [facility]: concern for possible fissure. Noted pelvic floor dyssnyergy   BIOPSY  07/29/2017   Procedure: BIOPSY;  Surgeon: Shaaron Lamar HERO, MD;  Location: [facility];  Service: Endoscopy;;  ascending and sigmoid colon   CARDIAC CATHETERIZATION     with stent   CARDIOVASCULAR STRESS TEST  07/21/2009   No scintigraphic evidence of inducible myocardial ischemia   CARPAL TUNNEL RELEASE Left 01/17/2017   Procedure: LEFT CARPAL TUNNEL RELEASE;  Surgeon: Margrette Taft BRAVO, MD;  Location: [facility] OR[facility];  Service: Orthopedics;  Laterality: Left;   CATARACT EXTRACTION, BILATERAL Bilateral    CERVICAL SPINE SURGERY     C4-5   COLONOSCOPY  04/2005   granularity and friability erosions from rectum to 40cm. Bx infection vs IBD. C. Diff positive at the time.    COLONOSCOPY  05/2010   [facility]: left-sided UC, bx with no dysplasia, shallow diverticula   COLONOSCOPY [name] 11/07/2012   MFM:[id number] proctocolitis status post  segmental biopsy/Sigmoid colon polyps removed as described above. Procedure compromisd by technical difficulties. bx: Inflammation limited to sigmoid and rectum on pathology.   COLONOSCOPY  04/2014   Dr. Landy at [facility]: well localized proctocolitis limited to sigmoid   COLONOSCOPY WITH PROPOFOL  [name] 07/29/2017   diverticulosis in colon, three 4-6 mm polyps at splenic flexure and in cecum, one 10 mm polyp in rectum, abnormal rectum and sigmoid consistent with active UC   CORONARY STENT PLACEMENT  01/1995   ESOPHAGEAL DILATION  01/20/2022   Procedure: ESOPHAGEAL DILATION;  Surgeon: Elicia Claw, MD;  Location: [facility];  Service: Gastroenterology;;   ESOPHAGOGASTRODUODENOSCOPY  07/24/2010   MFM:Wnmfjo esophagus   ESOPHAGOGASTRODUODENOSCOPY  04/2014   Dr. Landy at [facility]: negative small bowel biopsies   ESOPHAGOGASTRODUODENOSCOPY (EGD) WITH PROPOFOL  [name] 01/20/2022   Procedure: ESOPHAGOGASTRODUODENOSCOPY (EGD) WITH PROPOFOL ;  Surgeon: Elicia Claw, MD;  Location: [facility];  Service: Gastroenterology;  Laterality: [name];   FOOT SURGERY Bilateral two   KNEE SURGERY  two   POLYPECTOMY  07/29/2017   Procedure: POLYPECTOMY;  Surgeon: Shaaron Lamar HERO, MD;  Location: [facility];  Service: Endoscopy;;  splenic flexure, ascending colon polyp;rectal   SHOULDER SURGERY  two   TONSILLECTOMY     TRANSTHORACIC ECHOCARDIOGRAM  03/23/2009   EF 60-65%, normal LV systolic function   ULNAR HEAD EXCISION Left 01/17/2017   Procedure: LEFT ULNAR HEAD RESECTION;  Surgeon: Margrette Taft BRAVO, MD;  Location: [facility];  Service: Orthopedics;  Laterality: Left;    Medication: Current Facility-Administered Medications  Medication Dose Route Frequency Provider Last Rate Last Admin   acetaminophen  (TYLENOL ) tablet 1,000 mg  1,000 mg Oral Q6H PRN Segars, Dorn, MD       albuterol  (PROVENTIL ) (2.5 MG/3ML) 0.083% nebulizer solution 2.5 mg  2.5 mg Nebulization Q4H PRN Segars, Dorn, MD       aspirin   EC tablet 81 mg  81 mg Oral Daily Segars, Jonathan, MD   81 mg at 08/15/23 0856   azithromycin  (ZITHROMAX ) tablet 500 mg  500 mg Oral QHS Segars, Jonathan, MD   500 mg at 08/15/23 0016   carvedilol  (COREG ) tablet 12.5 mg  12.5 mg Oral BID WC Segars, Jonathan, MD   12.5 mg at 08/15/23 0856   cefTRIAXone  (ROCEPHIN ) 1 g in sodium chloride  0.9 % 100 mL IVPB  1 g Intravenous Q24H Segars, Dorn, MD       DULoxetine  (CYMBALTA ) DR capsule 60 mg  60 mg Oral Daily Segars, Jonathan, MD   60 mg at 08/15/23 9143   isosorbide  mononitrate (IMDUR ) 24 hr tablet 30 mg  30 mg Oral Daily Segars, Dorn, MD   30 mg at 08/15/23 0856   melatonin tablet 6 mg  6 mg Oral QHS PRN Segars, Jonathan, MD       ondansetron  (ZOFRAN ) injection 4 mg  4 mg Intravenous Q6H PRN Segars, Dorn, MD       polyethylene glycol (MIRALAX  / GLYCOLAX ) packet 17 g  17 g Oral Daily PRN Segars, Jonathan, MD       pravastatin  (PRAVACHOL )  tablet 40 mg  40 mg Oral Daily Segars, Jonathan, MD   40 mg at 08/15/23 0856   sodium chloride  flush (NS) 0.9 % injection 3 mL  3 mL Intravenous Q12H Segars, Jonathan, MD   3 mL at 08/14/23 2237   tamsulosin  (FLOMAX ) capsule 0.4 mg  0.4 mg Oral Daily Segars, Jonathan, MD   0.4 mg at 08/15/23 9143   torsemide  (DEMADEX ) tablet 20 mg  20 mg Oral Daily Segars, Jonathan, MD   20 mg at 08/15/23 0857    Allergies: No Known Allergies  Social History: Social History   Tobacco Use   Smoking status: Former    Current packs/day: 0.00    Average packs/day: 1 pack/day for 20.0 years (20.0 ttl pk-yrs)    Types: Cigarettes    Start date: 09/30/1949    Quit date: 09/30/1969    Years since quitting: 53.9    Passive exposure: Past   Smokeless tobacco: Never   Tobacco comments:    Verified by Daughter, [name].  Vaping Use   Vaping status: Never Used  Substance Use Topics   Alcohol use: No   Drug use: No    Family History Family History  Problem Relation Age of Onset   Lung cancer Mother    Cancer  Mother        breast   Diabetes Mother    Stroke Father    Hypertension Father    Hyperlipidemia Father    Kidney failure Brother    Other Child        blood infection   Colon cancer Neg Hx     Review of Systems  Unable to perform ROS: Mental acuity     Objective   Vital signs in last 24 hours: BP (!) 124/46 (BP Location: Left Arm)   Pulse 61   Temp 98.5 F (36.9 C)   Resp 18   Ht 6' 1 (1.854 m)   SpO2 94%   BMI 38.00 kg/m   Physical Exam General: Alert and confused HEENT: [place]/AT Pulmonary: Normal work of breathing Cardiovascular: no cyanosis Abdomen: Soft, NTTP, nondistended GU:  Neuro: Appropriate, no focal neurological deficits  Most Recent Labs: Lab Results  Component Value Date   WBC 12.8 (H) 08/15/2023   HGB 11.5 (L) 08/15/2023   HCT 35.3 (L) 08/15/2023   PLT 284 08/15/2023    Lab Results  Component Value Date   NA 134 (L) 08/15/2023   K 4.1 08/15/2023   CL 102 08/15/2023   CO2 25 08/15/2023   BUN 35 (H) 08/15/2023   CREATININE 1.59 (H) 08/15/2023   CALCIUM 11.1 (H) 08/15/2023   MG 2.1 08/15/2023   PHOS 3.0 08/15/2023    Lab Results  Component Value Date   INR 1.0 12/02/2022   APTT 42 (H) 04/08/2019     Urine Culture: @[id number] (laburin,org,[id number])@   IMAGING: CT Head Wo Contrast Result Date: 08/14/2023 CLINICAL DATA:  Head trauma, neck trauma, mental status change of unknown cause. EXAM: CT HEAD WITHOUT CONTRAST CT CERVICAL SPINE WITHOUT CONTRAST TECHNIQUE: Multidetector CT imaging of the head and cervical spine was performed following the standard protocol without intravenous contrast. Multiplanar CT image reconstructions of the cervical spine were also generated. RADIATION DOSE REDUCTION: This exam was performed according to the departmental dose-optimization program which includes automated exposure control, adjustment of the mA and/or kV according to patient size and/or use of iterative reconstruction technique. COMPARISON:   10/28/2020. FINDINGS: CT HEAD FINDINGS Brain: No acute intracranial hemorrhage. No CT  evidence of acute infarct. Remote lacunar infarcts in the bilateral basal ganglia. Nonspecific hypoattenuation in the periventricular and subcortical white matter favored to reflect chronic microvascular ischemic changes. No edema, mass effect, or midline shift. The basilar cisterns are patent. Ventricles: The ventricles are normal. Vascular: No hyperdense vessel or unexpected calcification. Skull: No acute or aggressive finding. Orbits: Orbits are symmetric. Sinuses: Mucosal thickening throughout the paranasal sinuses most pronounced in the sphenoid sinuses with possible small air-fluid levels. Other: Small left mastoid effusion. CT CERVICAL SPINE FINDINGS Alignment: Straightening of the normal cervical lordosis. No significant listhesis. No facet subluxation or dislocation. Skull base and vertebrae: Anterior cervical fusion hardware at C4-5. Similar chronic fusion extending from C2 to the upper thoracic spine. Diffuse osteopenia. No evidence of compression fracture or displaced fracture in the cervical spine. No destructive osseous lesion. Soft tissues and spinal canal: No prevertebral fluid or swelling. No visible canal hematoma. Disc levels: Osseous fusion across multiple disc levels throughout the cervical spine. Disc osteophyte complexes at multiple levels. No high-grade osseous spinal canal stenosis. Facet arthrosis throughout the cervical spine. Prominent degenerative changes at the atlanto dens articulation. Asymmetric degenerative changes at the right atlantoaxial articulation. Upper chest: Paraseptal and centrilobular emphysema. Other: None. IMPRESSION: No CT evidence of acute intracranial abnormality. No acute fracture or traumatic malalignment of the cervical spine. Mucosal thickening in the paranasal sinuses with air-fluid levels in the sphenoid sinuses. Recommend correlation for acute sinusitis. Chronic and  degenerative changes as above. Electronically Signed   By: Donnice Mania M.D.   On: 08/14/2023 21:09   CT Cervical Spine Wo Contrast Result Date: 08/14/2023 CLINICAL DATA:  Head trauma, neck trauma, mental status change of unknown cause. EXAM: CT HEAD WITHOUT CONTRAST CT CERVICAL SPINE WITHOUT CONTRAST TECHNIQUE: Multidetector CT imaging of the head and cervical spine was performed following the standard protocol without intravenous contrast. Multiplanar CT image reconstructions of the cervical spine were also generated. RADIATION DOSE REDUCTION: This exam was performed according to the departmental dose-optimization program which includes automated exposure control, adjustment of the mA and/or kV according to patient size and/or use of iterative reconstruction technique. COMPARISON:  10/28/2020. FINDINGS: CT HEAD FINDINGS Brain: No acute intracranial hemorrhage. No CT evidence of acute infarct. Remote lacunar infarcts in the bilateral basal ganglia. Nonspecific hypoattenuation in the periventricular and subcortical white matter favored to reflect chronic microvascular ischemic changes. No edema, mass effect, or midline shift. The basilar cisterns are patent. Ventricles: The ventricles are normal. Vascular: No hyperdense vessel or unexpected calcification. Skull: No acute or aggressive finding. Orbits: Orbits are symmetric. Sinuses: Mucosal thickening throughout the paranasal sinuses most pronounced in the sphenoid sinuses with possible small air-fluid levels. Other: Small left mastoid effusion. CT CERVICAL SPINE FINDINGS Alignment: Straightening of the normal cervical lordosis. No significant listhesis. No facet subluxation or dislocation. Skull base and vertebrae: Anterior cervical fusion hardware at C4-5. Similar chronic fusion extending from C2 to the upper thoracic spine. Diffuse osteopenia. No evidence of compression fracture or displaced fracture in the cervical spine. No destructive osseous lesion. Soft  tissues and spinal canal: No prevertebral fluid or swelling. No visible canal hematoma. Disc levels: Osseous fusion across multiple disc levels throughout the cervical spine. Disc osteophyte complexes at multiple levels. No high-grade osseous spinal canal stenosis. Facet arthrosis throughout the cervical spine. Prominent degenerative changes at the atlanto dens articulation. Asymmetric degenerative changes at the right atlantoaxial articulation. Upper chest: Paraseptal and centrilobular emphysema. Other: None. IMPRESSION: No CT evidence of acute intracranial abnormality. No  acute fracture or traumatic malalignment of the cervical spine. Mucosal thickening in the paranasal sinuses with air-fluid levels in the sphenoid sinuses. Recommend correlation for acute sinusitis. Chronic and degenerative changes as above. Electronically Signed   By: Donnice Mania M.D.   On: 08/14/2023 21:09   DG Tibia/Fibula Left Result Date: 08/14/2023 CLINICAL DATA:  Fall, leg pain EXAM: LEFT TIBIA AND FIBULA - 2 VIEW COMPARISON:  Knee series 10/28/2020 FINDINGS: Prior left knee replacement No acute bony abnormality. Specifically, no fracture, subluxation, or dislocation. Soft tissues are intact. IMPRESSION: No acute bony abnormality. Electronically Signed   By: Franky Crease M.D.   On: 08/14/2023 21:04   DG Foot 2 Views Left Result Date: 08/14/2023 CLINICAL DATA:  Fall, leg pain EXAM: LEFT FOOT - 2 VIEW COMPARISON:  None Available. FINDINGS: Plantar and posterior calcaneal spurs. No acute bony abnormality. Specifically, no fracture, subluxation, or dislocation. Joint space narrowing in the IP joints and 1st MTP joint. Soft tissues are intact. IMPRESSION: No acute bony abnormality. Electronically Signed   By: Franky Crease M.D.   On: 08/14/2023 21:03   CT Renal Stone Study Result Date: 08/14/2023 CLINICAL DATA:  Flank pain weakness EXAM: CT ABDOMEN AND PELVIS WITHOUT CONTRAST TECHNIQUE: Multidetector CT imaging of the abdomen and pelvis  was performed following the standard protocol without IV contrast. RADIATION DOSE REDUCTION: This exam was performed according to the departmental dose-optimization program which includes automated exposure control, adjustment of the mA and/or kV according to patient size and/or use of iterative reconstruction technique. COMPARISON:  CT 12/03/2022 FINDINGS: Lower chest: Lung bases demonstrate patchy airspace disease at the left base. Dense right lower lobe airspace disease. Cardiomegaly with coronary vascular calcification Hepatobiliary: Gallstones. No focal hepatic abnormality or biliary dilatation Pancreas: Unremarkable. No pancreatic ductal dilatation or surrounding inflammatory changes. Spleen: Normal in size without focal abnormality. Adrenals/Urinary Tract: Adrenal glands are normal. Stable for left renal pelvis dilatation. There is dilatation of the mid left ureter with evidence for a 7 mm stone in the distal left ureter several cm proximal to the left UVJ. Multiple punctate right kidney stones. Multiple left-sided kidney stones, these measure up to 18 mm at the midpole. Bladder is unremarkable Stomach/Bowel: Stomach nonenlarged. No dilated small bowel. No acute bowel wall thickening. Negative appendix. Vascular/Lymphatic: Aortic atherosclerosis. No enlarged abdominal or pelvic lymph nodes. Reproductive: Prostate is unremarkable. Other: Negative for pelvic effusion or free air Musculoskeletal: No acute osseous abnormality. Diffuse ankylosis of the spine and SI joints. IMPRESSION: 1. 7 mm stone in the distal left ureter several cm proximal to the left UVJ with mild hydroureter of the upstream mid ureter. There is mild dilatation of left renal pelvis but stable as compared with CT from October and more decompressed appearance of the proximal left ureter between the renal pelvis and dilated ureteral segment. 2. Multiple bilateral kidney stones. 3. Gallstones. 4. Bibasilar airspace disease, right greater than  left, suspicious for pneumonia and or aspiration. 5. Aortic atherosclerosis. Aortic Atherosclerosis (ICD10-I70.0). Electronically Signed   By: Luke Bun M.D.   On: 08/14/2023 20:29   DG Chest 2 View Result Date: 08/14/2023 CLINICAL DATA:  Shortness of breath EXAM: CHEST - 2 VIEW COMPARISON:  12/04/2022, 01/22/2022 FINDINGS: Lateral views are limited by patient's arms. Cardiomegaly with vascular congestion and mild diffuse interstitial opacity suggestive of mild edema. Suspect also small pleural effusions. IMPRESSION: Cardiomegaly with vascular congestion and mild interstitial edema. Suspect small pleural effusions. Electronically Signed   By: Luke Bun HERO.D.  On: 08/14/2023 18:06    ------  Ole Bourdon, NP Pager: [phone]   Please contact the urology consult pager with any further questions/concerns.

## 2023-08-15 NOTE — Progress Notes (Addendum)
 BSE completed, full report to follow.  Pt's daughter, [name], present and reports to have fed pt some of his breakfast approx one hour prior.  She did not know the pt was to be NPO -    SLP saw pt with intake including 3 ounces water, 3 ounces OJ, 2 bites applesauce and single bolus of graham cracker *softened with applesauce.    Intake provided prior to knowing pt was to be NPO for potential procedure - MD and RN made aware that pt had po with daughter and SLP.    Pt without s/s of aspiration across all po observed - even with large sequential swallows.  He has dentures at home but they are not here - and daughter will bring them for tomorrow.  Recommend when resume diet - to resume dys3/thin with strict precautions.  Suspect pt has oral candidiasis - and also h/o Uvulectomy for snoring - that he states had not impacted his swallowing.  Daughter reports she has been informed that the pt aspirates and they desire to continue po with precautions in place.      ? Oral candidiasis? Bilateral posterior tongue   Madelin POUR, MS [facility] SLP Acute Rehab Services Office [phone]

## 2023-08-15 NOTE — Progress Notes (Signed)
 [facility] night cross cover note:   Regarding this patient, whose active code status for this hospitalization is DNR/DNI, pt's RN conveyed patient's family's request for completion of the patient's DNR form (golden ticket). Per pt's family's request, I subsequently completed the above DNR form for this patient.     Eva Pore, DO Hospitalist

## 2023-08-15 NOTE — TOC Initial Note (Addendum)
 Transition of Care [facility]) - Initial/Assessment Note    Patient Details  Name: [name] MRN: [id number] Date of Birth: 12/11/1940  Transition of Care [facility]) CM/SW Contact:    Jeoffrey LITTIE Moose, LCSW Phone Number: 08/15/2023, 11:46 AM  Clinical Narrative:                 11:49 AM- CSW attempted to call pt daughter, [name],  to speak with her about SNF placement for pt following d/c.  [name]'s phone went straight to voicemail, CSW left her a message.  2:22 PM- CSW completed SNF w/u with pt daughter, [name]. [name] agreed to SNF placement following pt d/c and stated her first 2 choices were [facility] and [facility]. CSW sent out SNF referrals and will update [name] with bed offers. [name] also stated her sister, Jon (pt POA), is working with [facility] DSS to apply pt for Medicaid.   Expected Discharge Plan: Skilled Nursing Facility Barriers to Discharge: SNF Pending bed offer, Continued Medical Work up, English as a second language teacher   Patient Goals and CMS Choice Patient states their goals for this hospitalization and ongoing recovery are:: SNF          Expected Discharge Plan and Services       Living arrangements for the past 2 months: Single Family Home                                      Prior Living Arrangements/Services Living arrangements for the past 2 months: Single Family Home   Patient language and need for interpreter reviewed:: Yes        Need for Family Participation in Patient Care: Yes (Comment)     Criminal Activity/Legal Involvement Pertinent to Current Situation/Hospitalization: No - Comment as needed  Activities of Daily Living   ADL Screening (condition at time of admission) Independently performs ADLs?: No Does the patient have a NEW difficulty with bathing/dressing/toileting/self-feeding that is expected to last >3 days?: No Does the patient have a NEW difficulty with getting in/out of bed, walking, or climbing stairs that is expected to  last >3 days?: No Does the patient have a NEW difficulty with communication that is expected to last >3 days?: No Is the patient deaf or have difficulty hearing?: Yes Does the patient have difficulty seeing, even when wearing glasses/contacts?: No Does the patient have difficulty concentrating, remembering, or making decisions?: No  Permission Sought/Granted                  Emotional Assessment Appearance:: Appears stated age     Orientation: : Oriented to Self, Oriented to Place, Oriented to Situation, Oriented to  Time Alcohol / Substance Use: Not Applicable Psych Involvement: No (comment)  Admission diagnosis:  Complicated UTI (urinary tract infection) [N39.0] Patient Active Problem List   Diagnosis Date Noted   Complicated UTI (urinary tract infection) 08/14/2023   Failure to thrive in adult 08/14/2023   Osteopenia 05/23/2022   Vitamin D  deficiency 05/23/2022   Other hyperparathyroidism ([name]) 05/08/2022   Stage 3a chronic kidney disease (CKD) ([name]) 01/19/2022   Depression, major, single episode, moderate ([name]) 02/20/2021   Status post cervical spinal fusion 07/19/2020   Cervical spondylosis 03/07/2020   DNR (do not resuscitate) 08/17/2019   COPD (chronic obstructive pulmonary disease) ([name]) 04/09/2019   Chronic respiratory failure with hypoxia ([name]) 04/09/2019   Obesity with body mass index (BMI) of 30.0 to 39.9  04/09/2019   Ankylosing spondylitis ([name]) 11/07/2018   Chronic heart failure with preserved ejection fraction (HFpEF) ([name]) 03/27/2018   Morbid obesity ([name]) 03/12/2018   History of carpal tunnel surgery of left wrist with unlar head resection 01/17/17 01/24/2017   Hx of skin cancer, basal cell 11/30/2015   Erectile dysfunction 06/06/2015   Iron deficiency 08/18/2014   Essential hypertension 01/09/2013   Dyslipidemia, goal LDL below 70 01/09/2013   CAD (coronary artery disease) 12/14/2012   Hiatal hernia 12/14/2012   GERD (gastroesophageal reflux disease)  12/14/2012   Prediabetes 12/09/2012   BPH (benign prostatic hyperplasia) 11/18/2012   CAP (community acquired pneumonia) 05/09/2012   OSA treated with BiPAP 04/30/2012   Ulcerative colitis ([name]) 04/29/2012   PCP:  Alphonsa Glendia LABOR, MD Pharmacy:   [facility] [phone] - [place], [place] - [address] [place] [address] Phone: [phone] Fax: [phone]  [facility] [address] [place] [address] Phone: [phone] Fax: [phone]     Social Drivers of Health (SDOH) Social History: SDOH Screenings   Food Insecurity: No Food Insecurity (08/15/2023)  Housing: Low Risk  (08/15/2023)  Transportation Needs: No Transportation Needs (08/15/2023)  Utilities: Not At Risk (08/15/2023)  Alcohol Screen: Low Risk  (07/12/2023)  Depression (PHQ2-9): High Risk (07/12/2023)  Financial Resource Strain: Low Risk  (07/12/2023)  Physical Activity: Inactive (07/12/2023)  Social Connections: Moderately Isolated (08/15/2023)  Stress: No Stress Concern Present (07/12/2023)  Tobacco Use: Medium Risk (07/24/2023)  Health Literacy: Adequate Health Literacy (07/12/2023)   SDOH Interventions:     Readmission Risk Interventions    12/04/2022   12:13 PM  Readmission Risk Prevention Plan  Post Dischage Appt Complete  Medication Screening Complete  Transportation Screening Complete

## 2023-08-15 NOTE — Evaluation (Signed)
 Physical Therapy Evaluation Patient Details Name: [name] MRN: [id number] DOB: Feb 21, 1940 Today's Date: 08/15/2023  History of Present Illness  Patient is an 83 y/o male admitted 08/14/23 due to progressive weakness, decreased oral intake, and fall at home.  Found to have UTI, with ureteral stone and CAP.  PMH positive for CAD, COPD (4L O2), CHF, OSA on CPAP, ulcerative colitis, ankylosing spondylitis, HTN HLD, CKD 3A, dementia.  Clinical Impression  Patient presents with decreased mobility due to generalized weakness, decreased balance, decreased activity tolerance and decreased safety awareness with two recent falls.  Patient previously from home with assist for some BADL's and mobilizing with walker.  Currently needing mod A for sit to stand from elevated surface and unable tot take steps or tolerate less than a minute.  Feel he will benefit from skilled PT in the acute setting and from post-acute inpatient rehab (<3 hours/day) prior to d/c home.         If plan is discharge home, recommend the following: Two people to help with walking and/or transfers;Supervision due to cognitive status;A lot of help with bathing/dressing/bathroom   Can travel by private vehicle   No    Equipment Recommendations None recommended by PT  Recommendations for Other Services       Functional Status Assessment Patient has had a recent decline in their functional status and demonstrates the ability to make significant improvements in function in a reasonable and predictable amount of time.     Precautions / Restrictions Precautions Precautions: Fall Recall of Precautions/Restrictions: Impaired Restrictions Weight Bearing Restrictions Per Provider Order: No      Mobility  Bed Mobility Overal bed mobility: Needs Assistance Bed Mobility: Rolling, Supine to Sit, Sit to Supine Rolling: Min assist, Used rails   Supine to sit: Mod assist Sit to supine: Mod assist, +2 for physical assistance    General bed mobility comments: up to EOB assist for initiating moving legs and to lift trunk, scoot hips with time and cues; to supine MD in the room and assisted with legs, cues for lowering trunk and for positioning    Transfers Overall transfer level: Needs assistance Equipment used: Rolling walker (2 wheels) Transfers: Sit to/from Stand Sit to Stand: From elevated surface, Mod assist           General transfer comment: lifting help, pt pulling up on walker    Ambulation/Gait               General Gait Details: NT due to weakness, fecal incontinence  Stairs            Wheelchair Mobility     Tilt Bed    Modified Rankin (Stroke Patients Only)       Balance Overall balance assessment: Needs assistance   Sitting balance-Leahy Scale: Fair     Standing balance support: Bilateral upper extremity supported Standing balance-Leahy Scale: Poor Standing balance comment: UE support and A to maintain upright for hygiene and bed pad exchange about 30-40 seconds                             Pertinent Vitals/Pain Pain Assessment Pain Assessment: Faces Faces Pain Scale: No hurt    Home Living Family/patient expects to be discharged to:: Private residence Living Arrangements: Children Available Help at Discharge: Family;Available 24 hours/day Type of Home: House Home Access: Stairs to enter;Ramped entrance Entrance Stairs-Rails: Left Entrance Stairs-Number of Steps: 4   Home Layout:  One level Home Equipment: Shower seat - built Char[employer] fundraiser (2 wheels);Rollator (4 wheels);Wheelchair - manual Additional Comments: adjustable bed, PCA 4 hrs/day; family not available, pt provides some history, some from prior stay    Prior Function Prior Level of Function : Needs assist             Mobility Comments: Uses the rollator in the house ADLs Comments: assisted with showers     Extremity/Trunk Assessment   Upper Extremity Assessment Upper  Extremity Assessment: Generalized weakness (not formally tested, though noted shoulder elevation restrictions and scars on shoulder from prior surgery)    Lower Extremity Assessment Lower Extremity Assessment: RLE deficits/detail;LLE deficits/detail RLE Deficits / Details: AROM WFL, strength hip flexion 3-/5, knee extension 4/5 LLE Deficits / Details: AROM WFL, strength hip flexion 3-/5, knee extension 4/5    Cervical / Trunk Assessment Cervical / Trunk Assessment: Kyphotic  Communication   Communication Communication: Impaired Factors Affecting Communication: Hearing impaired    Cognition Arousal: Alert Behavior During Therapy: WFL for tasks assessed/performed   PT - Cognitive impairments: History of cognitive impairments, No family/caregiver present to determine baseline                       PT - Cognition Comments: slow to respond though follows all commands with multimodal cues, oriented to location, situation (fall) and month (knew his birthday was coming up next month) Following commands: Impaired Following commands impaired: Only follows one step commands consistently, Follows one step commands with increased time     Cueing Cueing Techniques: Verbal cues, Gestural cues, Tactile cues, Visual cues     General Comments General comments (skin integrity, edema, etc.): noted soiled with BM, though somwhat oozing while standing, assisted for hygiene in standing; VSS though BP not taken HR 60's SpO2 91% on 4L O2    Exercises     Assessment/Plan    PT Assessment Patient needs continued PT services  PT Problem List Decreased strength;Decreased activity tolerance;Decreased balance;Decreased mobility;Decreased safety awareness;Decreased knowledge of use of DME;Cardiopulmonary status limiting activity;Decreased knowledge of precautions       PT Treatment Interventions DME instruction;Gait training;Functional mobility training;Therapeutic activities;Therapeutic  exercise;Balance training;Patient/family education    PT Goals (Current goals can be found in the Care Plan section)  Acute Rehab PT Goals Patient Stated Goal: go to rehab PT Goal Formulation: With patient/family Time For Goal Achievement: 08/29/23 Potential to Achieve Goals: Fair    Frequency       Co-evaluation               AM-PAC PT 6 Clicks Mobility  Outcome Measure Help needed turning from your back to your side while in a flat bed without using bedrails?: A Little Help needed moving from lying on your back to sitting on the side of a flat bed without using bedrails?: Total Help needed moving to and from a bed to a chair (including a wheelchair)?: Total Help needed standing up from a chair using your arms (e.g., wheelchair or bedside chair)?: A Lot Help needed to walk in hospital room?: Total Help needed climbing 3-5 steps with a railing? : Total 6 Click Score: 9    End of Session Equipment Utilized During Treatment: Gait belt Activity Tolerance: Patient limited by fatigue Patient left: in bed;with call bell/phone within reach;with bed alarm set   PT Visit Diagnosis: Other abnormalities of gait and mobility (R26.89);Muscle weakness (generalized) (M62.81)    Time: 8982-8958 PT Time Calculation (min) (ACUTE  ONLY): 24 min   Charges:   PT Evaluation $PT Eval Moderate Complexity: 1 Mod PT Treatments $Therapeutic Activity: 8-22 mins PT General Charges $$ ACUTE PT VISIT: 1 Visit         Micheline Portal, PT Acute Rehabilitation Services Office:[phone] 08/15/2023   Montie Portal 08/15/2023, 10:58 AM

## 2023-08-15 NOTE — Plan of Care (Signed)

## 2023-08-15 NOTE — Progress Notes (Incomplete)
 Initial Nutrition Assessment  DOCUMENTATION CODES:      INTERVENTION:  ***   NUTRITION DIAGNOSIS:     related to   as evidenced by  .  ***  GOAL:      ***  MONITOR:      REASON FOR ASSESSMENT:   Consult Assessment of nutrition requirement/status  ASSESSMENT:  83 y.o. male with hx of CAD, HFpEF, COPD on 4 L O2, OSA on CPAP, ulcerative colitis, GERD, ankylosing spondylitis, HTN, HLD, CKD 3A, primary  hyperparathyroidism, morbid obesity, mood disorder, dementia, who presented with progressive fatigue, culminating in a ground-level fall, Failure to Thrive, with recent URI and UTI with left ureteral stone.    History is mainly provided by patient's daughter at the bedside.  She reports that he has had a long gradual decline especially over the past year with diminishing oral intake, weight loss 317 LB -> to 274 LB.  Has dysphagia to solids greater than liquids and eats a bite-size diet.  worsening weakness and requiring more help with ADLs.  He has had 2 falls in the past 6 months.  Most recently it was yesterday, no known injury fall from fall, mechanical in nature.  At this point daughter does not feel that she can safely care for him at home and is hoping for placement in a nursing facility.   Over the past 2 weeks he has had a more sharp decline. Reportedly there is a cold going around the family. He has associated sinus congestion, cough which is productive, including scant hemoptysis. Intermittent diarrhea and constipation. Urine has been malodorous. No chest pain, shortness of breath, lower extremity edema.   Admit weight: *** Current weight: ***   Average Meal Intake: ***-***: ***% intake x *** recorded meals  Nutritionally Relevant Medications: Scheduled Meds:  aspirin  EC  81 mg Oral Daily   azithromycin   500 mg Oral QHS   carvedilol   12.5 mg Oral BID WC   DULoxetine   60 mg Oral Daily   isosorbide  mononitrate  30 mg Oral Daily   pravastatin   40 mg Oral Daily    sodium chloride  flush  3 mL Intravenous Q12H   tamsulosin   0.4 mg Oral Daily   torsemide   20 mg Oral Daily   Continuous Infusions:  cefTRIAXone  (ROCEPHIN )  IV     Labs Reviewed: Sodium 134 BUN 35 Creatinine 1.59 Calcium 11.1 Albumin  2.5 GFR 43 CBG ranges from 114-135 mg/dL over the last 24 hours HgbA1c 6.3   ***   NUTRITION - FOCUSED PHYSICAL EXAM:  {RD Focused Exam List:21252}  Diet Order:   Diet Order             DIET DYS 3 Room service appropriate? Yes; Fluid consistency: Thin  Diet effective now                   EDUCATION NEEDS:      Skin:     Last BM:     Height:   Ht Readings from Last 1 Encounters:  08/15/23 6' 1 (1.854 m)    Weight:   Wt Readings from Last 1 Encounters:  07/12/23 130.6 kg    Ideal Body Weight:     BMI:  Body mass index is 38 kg/m.  Estimated Nutritional Needs:   Kcal:     Protein:     Fluid:       ***

## 2023-08-15 NOTE — Evaluation (Signed)
 Clinical/Bedside Swallow Evaluation Patient Details  Name: [name] MRN: [id number] Date of Birth: [birth year]  Today's Date: 08/15/2023 Time: SLP Start Time (ACUTE ONLY): 1201 SLP Stop Time (ACUTE ONLY): 1225 SLP Time Calculation (min) (ACUTE ONLY): 24 min  Past Medical History:  Past Medical History:  Diagnosis Date   Ankylosing spondylitis ([name]) 11/07/2018   Asthma    BPH (benign prostatic hyperplasia)    CAD (coronary artery disease) 01/1995   CHF (congestive heart failure) ([name])    Clostridium difficile colitis 04/2005   Colitis 2011   COPD (chronic obstructive pulmonary disease) ([name])    Depression    Diverticulosis    DJD (degenerative joint disease)    Fracture, [place], left, closed 11/29/2016   Gastric ulcer 04/17/2010   Three 5mm gastric ulcers, H.pylori serologies were negative   GERD (gastroesophageal reflux disease)    History of kidney stones    Hyperlipidemia    Hypertension    Idiopathic chronic inflammatory bowel disease 05/18/2010   left-sided UC   Kidney stone    Morbid obesity ([name]) 03/12/2018   Obstructive sleep apnea    on Cpap   Reflux 02/1995   S/P endoscopy 07/24/2010   retained gastric contents, benign bx   Past Surgical History:  Past Surgical History:  Procedure Laterality Date   ANORECTAL MANOMETRY  2016   [facility]: concern for possible fissure. Noted pelvic floor dyssnyergy   BIOPSY  07/29/2017   Procedure: BIOPSY;  Surgeon: Chris Lamar HERO, MD;  Location: [facility];  Service: Endoscopy;;  ascending and sigmoid colon   CARDIAC CATHETERIZATION     with stent   CARDIOVASCULAR STRESS TEST  07/21/2009   No scintigraphic evidence of inducible myocardial ischemia   CARPAL TUNNEL RELEASE Left 01/17/2017   Procedure: LEFT CARPAL TUNNEL RELEASE;  Surgeon: Chris Taft BRAVO, MD;  Location: [facility];  Service: Orthopedics;  Laterality: Left;   CATARACT EXTRACTION, BILATERAL Bilateral    CERVICAL SPINE SURGERY     C4-5   COLONOSCOPY   04/2005   granularity and friability erosions from rectum to 40cm. Bx infection vs IBD. C. Diff positive at the time.    COLONOSCOPY  05/2010   [facility]: left-sided UC, bx with no dysplasia, shallow diverticula   COLONOSCOPY [name] 11/07/2012   MFM:[id number] proctocolitis status post segmental biopsy/Sigmoid colon polyps removed as described above. Procedure compromisd by technical difficulties. bx: Inflammation limited to sigmoid and rectum on pathology.   COLONOSCOPY  04/2014   Dr. Landy at [facility]: well localized proctocolitis limited to sigmoid   COLONOSCOPY WITH PROPOFOL  [name] 07/29/2017   diverticulosis in colon, three 4-6 mm polyps at splenic flexure and in cecum, one 10 mm polyp in rectum, abnormal rectum and sigmoid consistent with active UC   CORONARY STENT PLACEMENT  01/1995   ESOPHAGEAL DILATION  01/20/2022   Procedure: ESOPHAGEAL DILATION;  Surgeon: Chris Claw, MD;  Location: [facility];  Service: Gastroenterology;;   ESOPHAGOGASTRODUODENOSCOPY  07/24/2010   MFM:Wnmfjo esophagus   ESOPHAGOGASTRODUODENOSCOPY  04/2014   Dr. Landy at [facility]: negative small bowel biopsies   ESOPHAGOGASTRODUODENOSCOPY (EGD) WITH PROPOFOL  [name] 01/20/2022   Procedure: ESOPHAGOGASTRODUODENOSCOPY (EGD) WITH PROPOFOL ;  Surgeon: Chris Claw, MD;  Location: [facility];  Service: Gastroenterology;  Laterality: [name];   FOOT SURGERY Bilateral two   KNEE SURGERY  two   POLYPECTOMY  07/29/2017   Procedure: POLYPECTOMY;  Surgeon: Chris Lamar HERO, MD;  [facility];  Service: Endoscopy;;  splenic flexure, ascending colon polyp;rectal   SHOULDER SURGERY  two   TONSILLECTOMY     TRANSTHORACIC ECHOCARDIOGRAM  03/23/2009   EF 60-65%, normal LV systolic function   ULNAR HEAD EXCISION Left 01/17/2017   Procedure: LEFT ULNAR HEAD RESECTION;  Surgeon: Chris Taft BRAVO, MD;  Location: [facility];  Service: Orthopedics;  Laterality: Left;   HPI:  pt is an 83 yo male adm to [facility] with AMS, found to have  complex UTI - and found to have kidney stone.  Urology saw pt and plans for potential procedure - Pt with meal at bedside and daughter reports she fed pt approx one hour ago.  Pt with PMH + for Cardiomegaly with vascular congestion and mild interstitial edema, Suspect small pleural effusions. Swallow eval ordered on admit.  Daughter, [name], reports pt has h/o dysphagia and aspiration and he is s/p endoscopy - stretching. She states she was informed that the pt aspirates - and desires ongoing po diet with precautions.  Prior MBS showed silent aspiration of thin - in 2023 - due to decreased laryngeal closure.  SLP reviewed prior MBS showing structural dysphagia likely due to cevical spine and aspiration.    Assessment / Plan / Recommendation  Clinical Impression  Patient seen with daughter at bedside *Daughter reports she fed pt some of his breakfast approximately one hour ago- breakfast tray still in the room.  SLP saw pt with intake including 3 ounces water, 3 ounces OJ, 2 bites applesauce and single bolus of graham cracker *softened with applesauce.  Intake provided prior to knowing pt was to be NPO for potential procedure - MD and RN made aware that pt had po with daughter approx one hour ago and with this SLP.  Pt without s/s of aspiration across all po observed - even with large sequential swallows.  He has dentures at home, daughter will bring them for tomorrow.  Recommend when resume diet - to resume dys3/thin with strict precautions.  Suspect pt has oral candidiasis - and also h/o Uvulectomy for snoring - that he states had not impacted his swallowing - nor did it help with his swallowing.  Daughter and pt aware that pt aspirates and they desire to continue po with precautions in place. Daughter reports pt coughs whenever he eats -  No s/s of aspiration with po today. H/O silent aspiration on prior MBS 2023- but CXR negative for acute findings and benefit of thin liquids outweighs negatives in this  situation especially with frequent UTIs.  Will follow up briefly for dysphagia management, consider RMST to improve laryngeal closure to lessen pna risk and improve laryngeal closure/airway protection. SLP Visit Diagnosis: Dysphagia, oropharyngeal phase (R13.12)    Aspiration Risk  Moderate aspiration risk    Diet Recommendation Dysphagia 3 (Mech soft);Thin liquid (extra gravy/sauces)    Liquid Administration via: Cup;Straw Medication Administration: Other (Comment) (with puree -) Supervision: Patient able to self feed Compensations: Slow rate;Small sips/bites Postural Changes: Seated upright at 90 degrees;Remain upright for at least 30 minutes after po intake    Other  Recommendations Oral Care Recommendations: Oral care BID     Assistance Recommended at Discharge  [name]  Functional Status Assessment Patient has not had a recent decline in their functional status  Frequency and Duration min 1 x/week  1 week       Prognosis Prognosis for improved oropharyngeal function: Fair Barriers to Reach Goals: Time post onset Barriers/Prognosis Comment: chronicity      Swallow Study   General Date of Onset: 08/15/23 HPI: pt is an 83  yo male adm to [facility] with AMS, found to have complex UTI - and found to have kidney stone.  Urology saw pt and plans for potential procedure - Pt with meal at bedside and daughter reports she fed pt approx one hour ago.  Pt with PMH + for Cardiomegaly with vascular congestion and mild interstitial edema, Suspect small pleural effusions. Swallow eval ordered on admit.  Daughter, [name], reports pt has h/o dysphagia and aspiration and he is s/p endoscopy - stretching. She states she was informed that the pt aspirates - and desires ongoing po diet with precautions.  Prior MBS showed silent aspiration of thin - in 2023 - due to decreased laryngeal closure.  SLP reviewed prior MBS showing structural dysphagia likely due to cevical spine and aspiration. Type of Study: Bedside  Swallow Evaluation Diet Prior to this Study: Dysphagia 3 (mechanical soft);Thin liquids (Level 0) (was made NPO at approx 1030 am - dtr fed pt some breakfast approx one hour before SLP session) Temperature Spikes Noted: No Respiratory Status: Room air History of Recent Intubation: No Behavior/Cognition: Alert;Cooperative Oral Cavity Assessment: Erythema;Dry;Other (comment) (bilateral posterior lingual white areas concerning for potential oral candidiasis) Oral Care Completed by SLP: Yes Oral Cavity - Dentition: Edentulous Vision: Impaired for self-feeding Self-Feeding Abilities: Needs assist (can feed self - spills frequently) Patient Positioning: Upright in bed Baseline Vocal Quality: Low vocal intensity Volitional Cough: Other (Comment) (DNT) Volitional Swallow: Unable to elicit    Oral/Motor/Sensory Function Overall Oral Motor/Sensory Function: Within functional limits   Ice Chips Ice chips: Not tested   Thin Liquid Thin Liquid: Within functional limits Presentation: Straw    Nectar Thick Nectar Thick Liquid: Not tested   Honey Thick Honey Thick Liquid: Not tested   Puree Puree: Within functional limits Presentation: Self Fed;Spoon   Solid     Solid: Within functional limits Presentation: Self Fed Other Comments: small bite of graham cracker      Nicolas Emmie Caldron 08/15/2023,2:33 PM Madelin POUR, MS [facility] SLP Acute Rehab Services Office [phone]    ? Oral candidiasis

## 2023-08-16 ENCOUNTER — Inpatient Hospital Stay (HOSPITAL_COMMUNITY): Admitting: Certified Registered Nurse Anesthetist

## 2023-08-16 ENCOUNTER — Encounter (HOSPITAL_COMMUNITY): Payer: Self-pay | Admitting: Internal Medicine

## 2023-08-16 ENCOUNTER — Inpatient Hospital Stay (HOSPITAL_COMMUNITY)

## 2023-08-16 ENCOUNTER — Encounter (HOSPITAL_COMMUNITY)
Admission: EM | Disposition: A | Discharge status: Skilled Nursing Facility | Payer: Self-pay | Source: Home / Self Care | Attending: Internal Medicine

## 2023-08-16 DIAGNOSIS — I5032 Chronic diastolic (congestive) heart failure: Secondary | ICD-10-CM

## 2023-08-16 DIAGNOSIS — I110 Hypertensive heart disease with heart failure: Secondary | ICD-10-CM | POA: Diagnosis not present

## 2023-08-16 DIAGNOSIS — N201 Calculus of ureter: Secondary | ICD-10-CM

## 2023-08-16 DIAGNOSIS — I2510 Atherosclerotic heart disease of native coronary artery without angina pectoris: Secondary | ICD-10-CM

## 2023-08-16 DIAGNOSIS — N390 Urinary tract infection, site not specified: Secondary | ICD-10-CM | POA: Diagnosis not present

## 2023-08-16 HISTORY — PX: CYSTOSCOPY/URETEROSCOPY/HOLMIUM LASER/STENT PLACEMENT: SHX6546

## 2023-08-16 LAB — URINE CULTURE: Culture: >=100000 — AB

## 2023-08-16 LAB — GLUCOSE, CAPILLARY: Glucose-Capillary: 212 mg/dL — ABNORMAL HIGH (ref 70–99)

## 2023-08-16 SURGERY — CYSTOSCOPY/URETEROSCOPY/HOLMIUM LASER/STENT PLACEMENT
Anesthesia: General | Laterality: Left

## 2023-08-16 MED ORDER — LIDOCAINE 2% (20 MG/ML) 5 ML SYRINGE
INTRAMUSCULAR | Status: DC | PRN
  Administered 2023-08-16: 100 mg via INTRAVENOUS

## 2023-08-16 MED ORDER — NYSTATIN 100000 UNIT/ML MT SUSP
5.0000 mL | Freq: Three times a day (TID) | OROMUCOSAL | Status: DC
  Administered 2023-08-16 – 2023-08-21 (×13): 500000 [IU] via OROMUCOSAL
  Filled 2023-08-16 (×17): qty 5

## 2023-08-16 MED ORDER — PROPOFOL 10 MG/ML IV BOLUS
INTRAVENOUS | Status: DC | PRN
  Administered 2023-08-16: 80 mg via INTRAVENOUS

## 2023-08-16 MED ORDER — ONDANSETRON HCL 4 MG/2ML IJ SOLN
INTRAMUSCULAR | Status: AC
  Filled 2023-08-16: qty 2

## 2023-08-16 MED ORDER — ENSURE PLUS HIGH PROTEIN PO LIQD
237.0000 mL | Freq: Two times a day (BID) | ORAL | Status: DC
  Administered 2023-08-17 – 2023-08-21 (×8): 237 mL via ORAL
  Filled 2023-08-16: qty 237

## 2023-08-16 MED ORDER — LACTATED RINGERS IV SOLN: INTRAVENOUS | Status: DC

## 2023-08-16 MED ORDER — SUCCINYLCHOLINE CHLORIDE 200 MG/10ML IV SOSY
PREFILLED_SYRINGE | INTRAVENOUS | Status: DC | PRN
  Administered 2023-08-16: 160 mg via INTRAVENOUS

## 2023-08-16 MED ORDER — PHENYLEPHRINE HCL-NACL 20-0.9 MG/250ML-% IV SOLN
INTRAVENOUS | Status: DC | PRN
  Administered 2023-08-16: 25 ug/min via INTRAVENOUS

## 2023-08-16 MED ORDER — CHLORHEXIDINE GLUCONATE 0.12 % MT SOLN: 15.0000 mL | Freq: Once | OROMUCOSAL | Status: AC

## 2023-08-16 MED ORDER — DEXAMETHASONE SODIUM PHOSPHATE 10 MG/ML IJ SOLN
INTRAMUSCULAR | Status: AC
  Filled 2023-08-16: qty 1

## 2023-08-16 MED ORDER — ORAL CARE MOUTH RINSE: 15.0000 mL | Freq: Once | OROMUCOSAL | Status: AC

## 2023-08-16 MED ORDER — FENTANYL CITRATE (PF) 250 MCG/5ML IJ SOLN
INTRAMUSCULAR | Status: DC | PRN
  Administered 2023-08-16: 50 ug via INTRAVENOUS

## 2023-08-16 MED ORDER — FENTANYL CITRATE (PF) 250 MCG/5ML IJ SOLN
INTRAMUSCULAR | Status: AC
  Filled 2023-08-16: qty 5

## 2023-08-16 MED ORDER — SODIUM CHLORIDE 0.9 % IR SOLN
Status: DC | PRN
  Administered 2023-08-16: 3000 mL via INTRAVESICAL

## 2023-08-16 MED ORDER — ONDANSETRON HCL 4 MG/2ML IJ SOLN
INTRAMUSCULAR | Status: DC | PRN
  Administered 2023-08-16: 4 mg via INTRAVENOUS

## 2023-08-16 MED ORDER — IOHEXOL 300 MG/ML  SOLN
INTRAMUSCULAR | Status: DC | PRN
Start: 2023-08-16 — End: 2023-08-16
  Administered 2023-08-16: 10 mL via URETHRAL

## 2023-08-16 MED ORDER — SUGAMMADEX SODIUM 200 MG/2ML IV SOLN
INTRAVENOUS | Status: DC | PRN
  Administered 2023-08-16: 200 mg via INTRAVENOUS

## 2023-08-16 MED ORDER — CHLORHEXIDINE GLUCONATE 0.12 % MT SOLN
OROMUCOSAL | Status: AC
  Administered 2023-08-16: 15 mL via OROMUCOSAL
  Filled 2023-08-16: qty 15

## 2023-08-16 MED ORDER — DEXAMETHASONE SODIUM PHOSPHATE 10 MG/ML IJ SOLN
INTRAMUSCULAR | Status: DC | PRN
  Administered 2023-08-16: 8 mg via INTRAVENOUS

## 2023-08-16 MED ORDER — ROCURONIUM BROMIDE 10 MG/ML (PF) SYRINGE
PREFILLED_SYRINGE | INTRAVENOUS | Status: DC | PRN
  Administered 2023-08-16: 30 mg via INTRAVENOUS

## 2023-08-16 SURGICAL SUPPLY — 17 items
BAG DRAIN URO-CYSTO SKYTR STRL (DRAIN) ×1 IMPLANT
CATH URETL OPEN 5X70 (CATHETERS) ×1 IMPLANT
GLOVE BIO SURGEON STRL SZ7 (GLOVE) ×1 IMPLANT
GOWN STRL REUS W/ TWL LRG LVL3 (GOWN DISPOSABLE) ×1 IMPLANT
GUIDEWIRE STR DUAL SENSOR (WIRE) ×1 IMPLANT
IV NS 1000ML BAXH (IV SOLUTION) ×1 IMPLANT
KIT TURNOVER KIT B (KITS) ×1 IMPLANT
LASER FIB FLEXIVA PULSE ID 365 (Laser) IMPLANT
MANIFOLD NEPTUNE II (INSTRUMENTS) ×1 IMPLANT
NS IRRIG 500ML POUR BTL (IV SOLUTION) ×1 IMPLANT
PACK CYSTO (CUSTOM PROCEDURE TRAY) ×1 IMPLANT
SLEEVE SCD COMPRESS KNEE MED (STOCKING) ×1 IMPLANT
SOL .9 NS 3000ML IRR UROMATIC (IV SOLUTION) ×1 IMPLANT
STENT URET 6FRX26 CONTOUR (STENTS) IMPLANT
SYR 10ML LL (SYRINGE) ×1 IMPLANT
TUBE CONNECTING 12X1/4 (SUCTIONS) ×1 IMPLANT
TUBING UROLOGY SET (TUBING) ×1 IMPLANT

## 2023-08-16 NOTE — Progress Notes (Signed)
 Initial Nutrition Assessment  DOCUMENTATION CODES:  Obesity unspecified  INTERVENTION:  Post operatively, recommend: Resume dysphagia 3 diet as recommended by SLP Ensure Plus High Protein po BID, each supplement provides 350 kcal and 20 grams of protein. Magic cup TID with meals, each supplement provides 290 kcal and 9 grams of protein  NUTRITION DIAGNOSIS:  Increased nutrient needs related to acute illness as evidenced by estimated needs.  GOAL:  Patient will meet greater than or equal to 90% of their needs  MONITOR:  PO intake, Supplement acceptance, Weight trends, Labs  REASON FOR ASSESSMENT:  Consult Assessment of nutrition requirement/status  ASSESSMENT:  Pt admitted with progressive fatigue resulting in a ground level fall. Found to have complicated UTI on admission. PMH significant for CAD, HFpEF, COPD, ulcerative colitis, HTN, HLD, CKD IIIa, primary hyperparathyroidism, dementia.  NPO for planned cystoscopy, left retrograde pyelogram, left laser lithotripsy and left ureteral stent placement.   Unable to obtain detailed nutrition related history as pt off unit in OR for procedure at this time.   Pt notably with progressive dysphagia now with both solids and liquids. SLP evaluated for bedside swallow yesterday.  No observed s/s of aspiration at that time. Pt with h/o silent aspiration on MBS in 2023 but CXR negative for acute findings and benefit of thin liquids outweighs negatives in this situation especially with frequent UTIs. Recommend resumption of dysphagia 3, thin liquids post OR.  Reviewed weight history within the last year.  Current weight today is measured weight via bed scale.  Between 09/10/22-08/16/23, pt appears to have had a weight loss of 9.2% which is not clinically significant for time frame. However weight loss is concerning given overall clinical picture and nutrition risk.   Medications: abx, nystatin , torsemide  20mg  daily  Labs:  Sodium 134 BUN  35 Cr 1.59 Corrected calcium 12.3 GFR 43  NUTRITION - FOCUSED PHYSICAL EXAM: Deferred to follow up.   Diet Order:   Diet Order             Diet NPO time specified  Diet effective now                   EDUCATION NEEDS:   No education needs have been identified at this time  Skin:  Skin Assessment: Reviewed RN Assessment  Last BM:  7/11 type 6 small  Height:   Ht Readings from Last 1 Encounters:  08/16/23 (P) 6' 1 (1.854 m)    Weight:   Wt Readings from Last 1 Encounters:  08/16/23 (P) 127.7 kg    Ideal Body Weight:  83.6 kg  BMI:  Body mass index is 37.14 kg/m (pended).  Estimated Nutritional Needs:   Kcal:  2100-2300  Protein:  110-125g  Fluid:  >/=2L  [name], RDN, LDN Clinical Nutrition See AMiON for contact information.

## 2023-08-16 NOTE — Anesthesia Preprocedure Evaluation (Signed)
 Anesthesia Evaluation  Patient identified by MRN, date of birth, ID band Patient awake  General Assessment Comment: Patient very lethargic, keeps nodding off during conversation. When he answers, he does answer appropriately, and is AO x 3.  Reviewed: Allergy & Precautions, NPO status , Patient's Chart, lab work & pertinent test results  Airway Mallampati: IV  TM Distance: >3 FB Neck ROM: Limited    Dental  (+) Edentulous Upper, Edentulous Lower   Pulmonary asthma , sleep apnea and Continuous Positive Airway Pressure Ventilation , COPD,  oxygen  dependent, former smoker   Pulmonary exam normal        Cardiovascular hypertension, Pt. on medications and Pt. on home beta blockers + CAD and +CHF   Rhythm:Regular Rate:Normal  ECHO 12/23:  1. Left ventricular ejection fraction, by estimation, is 55 to 60%. The  left ventricle has normal function. The left ventricle has no regional  wall motion abnormalities. There is moderate concentric left ventricular  hypertrophy. Indeterminate diastolic  filling due to E-A fusion.   2. Right ventricular systolic function was not well visualized. The right  ventricular size is not well visualized.   3. The mitral valve was not well visualized. No evidence of mitral valve  regurgitation.   4. The aortic valve was not well visualized. Aortic valve regurgitation  is not visualized. No aortic stenosis is present.   5. Aortic dilatation noted. There is mild dilatation of the aortic root,  measuring 41 mm.   6. The inferior vena cava is normal in size with greater than 50%  respiratory variability, suggesting right atrial pressure of 3 mmHg.   Comparison(s): No significant change from prior study.     Neuro/Psych  PSYCHIATRIC DISORDERS  Depression    negative neurological ROS     GI/Hepatic Neg liver ROS, hiatal hernia, PUD,GERD  ,,Dysphagia    Endo/Other  negative endocrine ROS     Renal/GU Renal disease  negative genitourinary   Musculoskeletal  (+) Arthritis , Osteoarthritis,    Abdominal Normal abdominal exam  (+)   Peds  Hematology negative hematology ROS (+)   Anesthesia Other Findings   Reproductive/Obstetrics                              Anesthesia Physical Anesthesia Plan  ASA: 3  Anesthesia Plan: General   Post-op Pain Management:    Induction: Intravenous  PONV Risk Score and Plan: 3 and Ondansetron  and Dexamethasone   Airway Management Planned: Oral ETT and Video Laryngoscope Planned  Additional Equipment: None  Intra-op Plan:   Post-operative Plan: Extubation in OR  Informed Consent: I have reviewed the patients History and Physical, chart, labs and discussed the procedure including the risks, benefits and alternatives for the proposed anesthesia with the patient or authorized representative who has indicated his/her understanding and acceptance.     Dental advisory given  Plan Discussed with: CRNA and Surgeon  Anesthesia Plan Comments: (Discussed risks of anesthesia with patient, including PONV, sore throat, lip/dental/eye damage. Rare risks discussed as well, such as cardiorespiratory and neurological sequelae, and allergic reactions. Discussed the role of CRNA in patient's perioperative care. Patient understands.)        Anesthesia Quick Evaluation

## 2023-08-16 NOTE — Op Note (Signed)
 Date of procedure: 08/16/23  Preoperative diagnosis:  Left ureteral stone  Postoperative diagnosis:  Same  Procedure: Cystoscopy, left ureteroscopy, laser lithotripsy, left retrograde pyelogram with intraoperative interpretation, left ureteral stent placement  Surgeon: Redell Burnet, MD  Anesthesia: General  Complications: None  Intraoperative findings:  Moderate size prostate, moderate bladder trabeculations, no suspicious lesions, ureteral orifices orthotopic bilaterally Uncomplicated dusting of left distal ureteral stone and stent placement  EBL: None  Specimens: None  Drains: Left 6 French by 26 cm ureteral stent  Indication: [name] is a 83 y.o. patient with 7 mm left distal ureteral stone, failure to thrive, questionable UTI, has been on antibiotics, using shared decision making family opted for ureteroscopy and laser lithotripsy.  After reviewing the management options for treatment, they elected to proceed with the above surgical procedure(s). We have discussed the potential benefits and risks of the procedure, side effects of the proposed treatment, the likelihood of the patient achieving the goals of the procedure, and any potential problems that might occur during the procedure or recuperation. Informed consent has been obtained.  Description of procedure:  The patient was taken to the operating room and general anesthesia was induced. SCDs were placed for DVT prophylaxis. The patient was placed in the dorsal lithotomy position, prepped and draped in the usual sterile fashion, and preoperative antibiotics(ceftriaxone ) were administered. A preoperative time-out was performed.   A 21 French rigid cystoscope was used to intubate the urethra and a normal-appearing urethra was followed proximally into the bladder.  The prostate was moderate in size.  There were moderate to severe bladder trabeculations, and ureteral orifices were orthotopic bilaterally.  A sensor  wire was used to intubate the left ureteral orifice and passed easily up to the left kidney under fluoroscopic vision.  A semirigid long ureteroscope was advanced alongside the wire and a yellow stone was identified in the distal to mid ureter.  A 365 m laser fiber on settings of 1.5 J and 15 Hz was used to methodically fragment the stone to dust, and pieces were irrigated free from the ureter.  No stone fragments were larger than the laser fiber.  The scope was advanced to the proximal ureter and a retrograde pyelogram showed no extravasation.  Pullback ureteroscopy showed no evidence of residual significant stones in the ureter.  The rigid cystoscope was backloaded over the wire and a 6 [place] by 26 cm ureteral stent was placed uneventfully, there was some excess curl in the kidney.  The bladder was drained and this concluded the procedure.  Disposition: Stable to PACU  Plan: Will coordinate follow-up for stent removal in 1 to 2 weeks with Dr. Watt Redell Burnet, MD

## 2023-08-16 NOTE — Progress Notes (Signed)
 Pt in Short Stay preparing for surgery. We attempted to reach both his daughters, however both numbers went straight to voicemail. He is very lethargic but A&Ox4 , he knows he is here for a stent due to a kidney stone, therefore he signed consent for himself.

## 2023-08-16 NOTE — Anesthesia Procedure Notes (Signed)
 Procedure Name: Intubation Date/Time: 08/16/2023 12:51 PM  Performed by: Boyce Shilling, CRNAPre-anesthesia Checklist: Patient identified, Emergency Drugs available, Suction available, Timeout performed and Patient being monitored Patient Re-evaluated:Patient Re-evaluated prior to induction Oxygen  Delivery Method: Circle system utilized Preoxygenation: Pre-oxygenation with 100% oxygen  Induction Type: IV induction and Rapid sequence Ventilation: Mask ventilation without difficulty Laryngoscope Size: Glidescope and 4 Grade View: Grade I Tube type: Oral Tube size: 7.5 mm Number of attempts: 1 Airway Equipment and Method: Stylet, Rigid stylet and Video-laryngoscopy Placement Confirmation: ETT inserted through vocal cords under direct vision, positive ETCO2, CO2 detector and breath sounds checked- equal and bilateral Secured at: 22 cm Tube secured with: Tape Dental Injury: Teeth and Oropharynx as per pre-operative assessment  Difficulty Due To: Difficulty was anticipated and Difficult Airway- due to reduced neck mobility Future Recommendations: Recommend- induction with short-acting agent, and alternative techniques readily available

## 2023-08-16 NOTE — Plan of Care (Signed)

## 2023-08-16 NOTE — Anesthesia Postprocedure Evaluation (Signed)
 Anesthesia Post Note  Patient: VRISHANK MOSTER  Procedure(s) Performed: CYSTOSCOPY, URETEROSCOPY, LASER LITHOTRIPSY, STENT PLACEMENT, RETROGRADE PYLOGRAM (Left)     Patient location during evaluation: PACU Anesthesia Type: General Level of consciousness: awake and alert Pain management: pain level controlled Vital Signs Assessment: post-procedure vital signs reviewed and stable Respiratory status: spontaneous breathing, nonlabored ventilation, respiratory function stable and patient connected to nasal cannula oxygen  Cardiovascular status: blood pressure returned to baseline and stable Postop Assessment: no apparent nausea or vomiting Anesthetic complications: no   No notable events documented.  Last Vitals:  Vitals:   08/16/23 1400 08/16/23 1415  BP: (!) 145/50 (!) 145/54  Pulse: 63 64  Resp: (!) 26 19  Temp:  37.2 C  SpO2: 93% 93%    Last Pain:  Vitals:   08/16/23 1345  TempSrc:   PainSc: Asleep                 Rome Ade

## 2023-08-16 NOTE — Transfer of Care (Signed)
 Immediate Anesthesia Transfer of Care Note  Patient: [name]  Procedure(s) Performed: CYSTOSCOPY, URETEROSCOPY, LASER LITHOTRIPSY, STENT PLACEMENT, RETROGRADE PYLOGRAM (Left)  Patient Location: PACU  Anesthesia Type:General  Level of Consciousness: awake  Airway & Oxygen  Therapy: Patient Spontanous Breathing and Patient connected to face mask oxygen   Post-op Assessment: Report given to RN and Post -op Vital signs reviewed and stable  Post vital signs: Reviewed and stable  Last Vitals:  Vitals Value Taken Time  BP 141/52 08/16/23 13:45  Temp 37.2 C 08/16/23 13:45  Pulse 64 08/16/23 13:45  Resp 20 08/16/23 13:45  SpO2 92 % 08/16/23 13:45    Last Pain:  Vitals:   08/16/23 1143  TempSrc: (P) Oral  PainSc:       Patients Stated Pain Goal: 0 (08/15/23 0806)  Complications: No notable events documented.

## 2023-08-16 NOTE — Progress Notes (Addendum)
 PROGRESS NOTE    [name]  FMW:[id number] DOB: [birth year] DOA: 08/14/2023 PCP: Alphonsa Glendia LABOR, MD   Brief Narrative:  [name] is a 83 y.o. male with hx of CAD, HFpEF, COPD on 4 L O2, OSA on BiPAP, ulcerative colitis, ankylosing spondylitis, hypertension, hyperlipidemia, CKD 3A, primary  hyperparathyroidism, morbid obesity, mood disorder, dementia, who presented with progressive fatigue, culminating in a ground-level fall.  Assessment & Plan:   Principal Problem:   Complicated UTI (urinary tract infection) Active Problems:   CAP (community acquired pneumonia)   Failure to thrive in adult  Failure to thrive Ambulatory dysfunction, acute on chronic - ground-level fall Rule out overt dysphagia Rule out concurrent thrush -Progressive weakness over the past year with multiple falls, imaging negative at this time - PT OT to follow, appreciate insight recommendations - Appreciate speech evaluation -no clear signs or symptoms of overt speech dysfunction/dysphagia, continue dysphagia 3 diet thin liquids and follow clinically   Complicated Proteus urinary tract infection 7 mm distal left ureteral stone with hydroureter - Malodorous urine with notable bacteria on UA and pyuria - Continue ceftriaxone  x 3 days, culture resistant only to nitrofurantoin - Cystoscopy with lithotripsy and ureteral stenting this afternoon-tolerated procedure well per urology -appreciate insight recommendations -Plan for stent removal in 1 to 2 weeks with Dr. Watt outpatient   Chronic hypoxic respiratory failure  Rule out aspiration pneumonitis versus possible underlying community-acquired pneumonia  -Viral panel negative  - Recent sick contacts in the family however no indication for antivirals given timing and negative respiratory PCR - Treat empirically with ceftriaxone  in setting of above with azithromycin  x 5 days - Patient is without acute hypoxia but has known chronic hypoxia on 4 L nasal  cannula with BiPAP at night  CAD: continue home aspirin . Continue home pravastatin .  Continue home isosorbide  twice daily,  HFpEF, without acute exacerbation:  - Most recent echo December 2023: LVEF 55 to 60%, moderate LVH, indeterminate diastolic dysfunction.  - Minimally elevated BNP -Fluids given at intake, will resume home p.o. diuretics in the next 24 to 48 hours pending volume status and renal recovery  COPD: On 4 L O2 at home.  Continue supportive care, nebs  OSA: BiPAP nightly  Ulcerative colitis, ankylosing spondylitis: Not on active therapy  Hypertension: Continue carvedilol  ; hold on his amlodipine , hydralazine , spironolactone   HLD: Continue statin   CKD 3A: Baseline creatinine near 1.7.  Primary hyperparathyroidism: He has mild hypercalcemia at 70 which is similar to previous values.  If this is uptrending would give both hydration and IV Lasix   Morbid obesity: Reports losing weight in the outpatient setting, continue to trend Mood disorder: Continue home duloxetine  History of dementia: Not on any active therapy, unspecified type BPH: continue home Tamsulosin    DVT prophylaxis: SCDs Start: 08/14/23 2154 Code Status:   Code Status: Limited: Do not attempt resuscitation (DNR) -DNR-LIMITED -Do Not Intubate/DNI  Family Communication: None present  Status is: Inpatient  Dispo: The patient is from: Home              Anticipated d/c is to: Home              Anticipated d/c date is: 24 to 48 hours              Patient currently not medically stable for discharge  Consultants:  Urology  Procedures:  Potential cystoscopy 7/10  Antimicrobials:  Ceftriaxone  azithromycin   Subjective: No acute issues or events overnight, denies nausea vomiting diarrhea  constipation headache fevers chills or chest pain  Objective: Vitals:   08/15/23 1603 08/15/23 1959 08/16/23 0112 08/16/23 0540  BP: (!) 124/50 124/62 (!) 143/55 (!) 145/55  Pulse: 66 (!) 58 63 67  Resp: 19 19 20  19   Temp: 98.7 F (37.1 C) 99.2 F (37.3 C)  98.7 F (37.1 C)  TempSrc:    Oral  SpO2: 92% 94% 96% 93%  Weight:    127.7 kg  Height:        Intake/Output Summary (Last 24 hours) at 08/16/2023 0749 Last data filed at 08/16/2023 0600 Gross per 24 hour  Intake 398.91 ml  Output 2100 ml  Net -1701.09 ml   Filed Weights   08/16/23 0540  Weight: 127.7 kg    Examination:  General:  Pleasantly resting in bed, No acute distress.  Somnolent but easily arousable HEENT:  Normocephalic atraumatic.  Sclerae nonicteric, noninjected.  Extraocular movements intact bilaterally. Neck:  Without mass or deformity.  Trachea is midline. Lungs:  Clear to auscultate bilaterally without rhonchi, wheeze, or rales. Heart:  Regular rate and rhythm.  Without murmurs, rubs, or gallops. Abdomen:  Soft, nontender, obese, nondistended.  Without guarding or rebound. Extremities: Without cyanosis, clubbing, edema, or obvious deformity. Skin:  Warm and dry, no erythema.  Data Reviewed: I have personally reviewed following labs and imaging studies  CBC: Recent Labs  Lab 08/14/23 1641 08/14/23 1653 08/15/23 0455  WBC 15.3*  --  12.8*  NEUTROABS 9.9*  --   --   HGB 11.3* 11.9* 11.5*  HCT 34.7* 35.0* 35.3*  MCV 91.8  --  91.5  PLT 291  --  284   Basic Metabolic Panel: Recent Labs  Lab 08/14/23 1641 08/14/23 1653 08/15/23 0455  NA 133* 133* 134*  K 4.2 4.2 4.1  CL 101 102 102  CO2 22  --  25  GLUCOSE 135* 135* 114*  BUN 31* 32* 35*  CREATININE 1.82* 1.80* 1.59*  CALCIUM 11.0*  --  11.1*  MG  --   --  2.1  PHOS  --   --  3.0   GFR: Estimated Creatinine Clearance: 48.4 mL/min (A) (by C-G formula based on SCr of 1.59 mg/dL (H)). Liver Function Tests: Recent Labs  Lab 08/14/23 1641  AST 30  ALT 23  ALKPHOS 73  BILITOT 1.0  PROT 7.0  ALBUMIN  2.5*   Thyroid  Function Tests: Recent Labs    08/15/23 0455  TSH 1.853   Sepsis Labs: Recent Labs  Lab 08/14/23 1654 08/14/23 1844   LATICACIDVEN 1.0 0.8    Recent Results (from the past 240 hours)  Urine Culture     Status: Abnormal (Preliminary result)   Collection Time: 08/14/23  6:00 PM   Specimen: Urine, Random  Result Value Ref Range Status   Specimen Description URINE, RANDOM  Final   Special Requests NONE Reflexed from [id number]  Final   Culture (A)  Final    >=100,000 COLONIES/mL GRAM NEGATIVE RODS IDENTIFICATION AND SUSCEPTIBILITIES TO FOLLOW Performed at [facility], [address], [place], [place] [address]    Report Status PENDING  Incomplete  Respiratory (~20 pathogens) panel by PCR     Status: None   Collection Time: 08/14/23  8:15 PM   Specimen: Urine, Clean Catch; Respiratory  Result Value Ref Range Status   Adenovirus NOT DETECTED NOT DETECTED Final   Coronavirus 229E NOT DETECTED NOT DETECTED Final    Comment: (NOTE) The Coronavirus on the Respiratory Panel, DOES NOT  test for the novel  Coronavirus (2019 nCoV)    Coronavirus HKU1 NOT DETECTED NOT DETECTED Final   Coronavirus NL63 NOT DETECTED NOT DETECTED Final   Coronavirus OC43 NOT DETECTED NOT DETECTED Final   Metapneumovirus NOT DETECTED NOT DETECTED Final   Rhinovirus / Enterovirus NOT DETECTED NOT DETECTED Final   Influenza A NOT DETECTED NOT DETECTED Final   Influenza B NOT DETECTED NOT DETECTED Final   Parainfluenza Virus 1 NOT DETECTED NOT DETECTED Final   Parainfluenza Virus 2 NOT DETECTED NOT DETECTED Final   Parainfluenza Virus 3 NOT DETECTED NOT DETECTED Final   Parainfluenza Virus 4 NOT DETECTED NOT DETECTED Final   Respiratory Syncytial Virus NOT DETECTED NOT DETECTED Final   Bordetella pertussis NOT DETECTED NOT DETECTED Final   Bordetella Parapertussis NOT DETECTED NOT DETECTED Final   Chlamydophila pneumoniae NOT DETECTED NOT DETECTED Final   Mycoplasma pneumoniae NOT DETECTED NOT DETECTED Final    Comment: Performed at [facility], [address] [place], [place] [address]  Culture, blood (Routine X 2)  w Reflex to ID Panel     Status: None (Preliminary result)   Collection Time: 08/14/23 10:12 PM   Specimen: BLOOD RIGHT HAND  Result Value Ref Range Status   Specimen Description BLOOD RIGHT HAND  Final   Special Requests   Final    BOTTLES DRAWN AEROBIC AND ANAEROBIC Blood Culture adequate volume   Culture   Final    NO GROWTH < 12 HOURS Performed at [facility], [address] [place], [place] [address]    Report Status PENDING  Incomplete  Culture, blood (Routine X 2) w Reflex to ID Panel     Status: None (Preliminary result)   Collection Time: 08/14/23 10:12 PM   Specimen: BLOOD LEFT HAND  Result Value Ref Range Status   Specimen Description BLOOD LEFT HAND  Final   Special Requests   Final    BOTTLES DRAWN AEROBIC AND ANAEROBIC Blood Culture adequate volume   Culture   Final    NO GROWTH < 12 HOURS Performed at [facility], [address], [place], [place] [address]    Report Status PENDING  Incomplete         Radiology Studies: CT Head Wo Contrast Result Date: 08/14/2023 CLINICAL DATA:  Head trauma, neck trauma, mental status change of unknown cause. EXAM: CT HEAD WITHOUT CONTRAST CT CERVICAL SPINE WITHOUT CONTRAST TECHNIQUE: Multidetector CT imaging of the head and cervical spine was performed following the standard protocol without intravenous contrast. Multiplanar CT image reconstructions of the cervical spine were also generated. RADIATION DOSE REDUCTION: This exam was performed according to the departmental dose-optimization program which includes automated exposure control, adjustment of the mA and/or kV according to patient size and/or use of iterative reconstruction technique. COMPARISON:  10/28/2020. FINDINGS: CT HEAD FINDINGS Brain: No acute intracranial hemorrhage. No CT evidence of acute infarct. Remote lacunar infarcts in the bilateral basal ganglia. Nonspecific hypoattenuation in the periventricular and subcortical white matter favored to reflect  chronic microvascular ischemic changes. No edema, mass effect, or midline shift. The basilar cisterns are patent. Ventricles: The ventricles are normal. Vascular: No hyperdense vessel or unexpected calcification. Skull: No acute or aggressive finding. Orbits: Orbits are symmetric. Sinuses: Mucosal thickening throughout the paranasal sinuses most pronounced in the sphenoid sinuses with possible small air-fluid levels. Other: Small left mastoid effusion. CT CERVICAL SPINE FINDINGS Alignment: Straightening of the normal cervical lordosis. No significant listhesis. No facet subluxation or  dislocation. Skull base and vertebrae: Anterior cervical fusion hardware at C4-5. Similar chronic fusion extending from C2 to the upper thoracic spine. Diffuse osteopenia. No evidence of compression fracture or displaced fracture in the cervical spine. No destructive osseous lesion. Soft tissues and spinal canal: No prevertebral fluid or swelling. No visible canal hematoma. Disc levels: Osseous fusion across multiple disc levels throughout the cervical spine. Disc osteophyte complexes at multiple levels. No high-grade osseous spinal canal stenosis. Facet arthrosis throughout the cervical spine. Prominent degenerative changes at the atlanto dens articulation. Asymmetric degenerative changes at the right atlantoaxial articulation. Upper chest: Paraseptal and centrilobular emphysema. Other: None. IMPRESSION: No CT evidence of acute intracranial abnormality. No acute fracture or traumatic malalignment of the cervical spine. Mucosal thickening in the paranasal sinuses with air-fluid levels in the sphenoid sinuses. Recommend correlation for acute sinusitis. Chronic and degenerative changes as above. Electronically Signed   By: Donnice Mania M.D.   On: 08/14/2023 21:09   CT Cervical Spine Wo Contrast Result Date: 08/14/2023 CLINICAL DATA:  Head trauma, neck trauma, mental status change of unknown cause. EXAM: CT HEAD WITHOUT CONTRAST CT  CERVICAL SPINE WITHOUT CONTRAST TECHNIQUE: Multidetector CT imaging of the head and cervical spine was performed following the standard protocol without intravenous contrast. Multiplanar CT image reconstructions of the cervical spine were also generated. RADIATION DOSE REDUCTION: This exam was performed according to the departmental dose-optimization program which includes automated exposure control, adjustment of the mA and/or kV according to patient size and/or use of iterative reconstruction technique. COMPARISON:  10/28/2020. FINDINGS: CT HEAD FINDINGS Brain: No acute intracranial hemorrhage. No CT evidence of acute infarct. Remote lacunar infarcts in the bilateral basal ganglia. Nonspecific hypoattenuation in the periventricular and subcortical white matter favored to reflect chronic microvascular ischemic changes. No edema, mass effect, or midline shift. The basilar cisterns are patent. Ventricles: The ventricles are normal. Vascular: No hyperdense vessel or unexpected calcification. Skull: No acute or aggressive finding. Orbits: Orbits are symmetric. Sinuses: Mucosal thickening throughout the paranasal sinuses most pronounced in the sphenoid sinuses with possible small air-fluid levels. Other: Small left mastoid effusion. CT CERVICAL SPINE FINDINGS Alignment: Straightening of the normal cervical lordosis. No significant listhesis. No facet subluxation or dislocation. Skull base and vertebrae: Anterior cervical fusion hardware at C4-5. Similar chronic fusion extending from C2 to the upper thoracic spine. Diffuse osteopenia. No evidence of compression fracture or displaced fracture in the cervical spine. No destructive osseous lesion. Soft tissues and spinal canal: No prevertebral fluid or swelling. No visible canal hematoma. Disc levels: Osseous fusion across multiple disc levels throughout the cervical spine. Disc osteophyte complexes at multiple levels. No high-grade osseous spinal canal stenosis. Facet  arthrosis throughout the cervical spine. Prominent degenerative changes at the atlanto dens articulation. Asymmetric degenerative changes at the right atlantoaxial articulation. Upper chest: Paraseptal and centrilobular emphysema. Other: None. IMPRESSION: No CT evidence of acute intracranial abnormality. No acute fracture or traumatic malalignment of the cervical spine. Mucosal thickening in the paranasal sinuses with air-fluid levels in the sphenoid sinuses. Recommend correlation for acute sinusitis. Chronic and degenerative changes as above. Electronically Signed   By: Donnice Mania M.D.   On: 08/14/2023 21:09   DG Tibia/Fibula Left Result Date: 08/14/2023 CLINICAL DATA:  Fall, leg pain EXAM: LEFT TIBIA AND FIBULA - 2 VIEW COMPARISON:  Knee series 10/28/2020 FINDINGS: Prior left knee replacement No acute bony abnormality. Specifically, no fracture, subluxation, or dislocation. Soft tissues are intact. IMPRESSION: No acute bony abnormality. Electronically Signed   By:  Franky Crease M.D.   On: 08/14/2023 21:04   DG Foot 2 Views Left Result Date: 08/14/2023 CLINICAL DATA:  Fall, leg pain EXAM: LEFT FOOT - 2 VIEW COMPARISON:  None Available. FINDINGS: Plantar and posterior calcaneal spurs. No acute bony abnormality. Specifically, no fracture, subluxation, or dislocation. Joint space narrowing in the IP joints and 1st MTP joint. Soft tissues are intact. IMPRESSION: No acute bony abnormality. Electronically Signed   By: Franky Crease M.D.   On: 08/14/2023 21:03   CT Renal Stone Study Result Date: 08/14/2023 CLINICAL DATA:  Flank pain weakness EXAM: CT ABDOMEN AND PELVIS WITHOUT CONTRAST TECHNIQUE: Multidetector CT imaging of the abdomen and pelvis was performed following the standard protocol without IV contrast. RADIATION DOSE REDUCTION: This exam was performed according to the departmental dose-optimization program which includes automated exposure control, adjustment of the mA and/or kV according to patient size  and/or use of iterative reconstruction technique. COMPARISON:  CT 12/03/2022 FINDINGS: Lower chest: Lung bases demonstrate patchy airspace disease at the left base. Dense right lower lobe airspace disease. Cardiomegaly with coronary vascular calcification Hepatobiliary: Gallstones. No focal hepatic abnormality or biliary dilatation Pancreas: Unremarkable. No pancreatic ductal dilatation or surrounding inflammatory changes. Spleen: Normal in size without focal abnormality. Adrenals/Urinary Tract: Adrenal glands are normal. Stable for left renal pelvis dilatation. There is dilatation of the mid left ureter with evidence for a 7 mm stone in the distal left ureter several cm proximal to the left UVJ. Multiple punctate right kidney stones. Multiple left-sided kidney stones, these measure up to 18 mm at the midpole. Bladder is unremarkable Stomach/Bowel: Stomach nonenlarged. No dilated small bowel. No acute bowel wall thickening. Negative appendix. Vascular/Lymphatic: Aortic atherosclerosis. No enlarged abdominal or pelvic lymph nodes. Reproductive: Prostate is unremarkable. Other: Negative for pelvic effusion or free air Musculoskeletal: No acute osseous abnormality. Diffuse ankylosis of the spine and SI joints. IMPRESSION: 1. 7 mm stone in the distal left ureter several cm proximal to the left UVJ with mild hydroureter of the upstream mid ureter. There is mild dilatation of left renal pelvis but stable as compared with CT from October and more decompressed appearance of the proximal left ureter between the renal pelvis and dilated ureteral segment. 2. Multiple bilateral kidney stones. 3. Gallstones. 4. Bibasilar airspace disease, right greater than left, suspicious for pneumonia and or aspiration. 5. Aortic atherosclerosis. Aortic Atherosclerosis (ICD10-I70.0). Electronically Signed   By: Luke Bun M.D.   On: 08/14/2023 20:29   DG Chest 2 View Result Date: 08/14/2023 CLINICAL DATA:  Shortness of breath EXAM: CHEST  - 2 VIEW COMPARISON:  12/04/2022, 01/22/2022 FINDINGS: Lateral views are limited by patient's arms. Cardiomegaly with vascular congestion and mild diffuse interstitial opacity suggestive of mild edema. Suspect also small pleural effusions. IMPRESSION: Cardiomegaly with vascular congestion and mild interstitial edema. Suspect small pleural effusions. Electronically Signed   By: Luke Bun M.D.   On: 08/14/2023 18:06    Scheduled Meds:  aspirin  EC  81 mg Oral Daily   azithromycin   500 mg Oral QHS   carvedilol   12.5 mg Oral BID WC   DULoxetine   60 mg Oral Daily   feeding supplement  237 mL Oral BID BM   isosorbide  mononitrate  30 mg Oral Daily   nystatin   5 mL Mouth/Throat TID AC & HS   pravastatin   40 mg Oral Daily   sodium chloride  flush  3 mL Intravenous Q12H   tamsulosin   0.4 mg Oral Daily   torsemide   20 mg  Oral Daily   Continuous Infusions:  cefTRIAXone  (ROCEPHIN )  IV 1 g (08/15/23 2141)     LOS: 2 days   Time spent:  Elsie JAYSON [place], DO [facility]  If 7PM-7AM, please contact night-coverage [email or web address]  08/16/2023, 7:49 AM

## 2023-08-16 NOTE — TOC Progression Note (Signed)
 Transition of Care [facility]) - Progression Note    Patient Details  Name: [name] MRN: [id number] Date of Birth: 07/23/1940  [facility] CM/SW Contact  Kenedi Cilia LITTIE Moose, LCSW Phone Number: 08/16/2023, 3:55 PM  Clinical Narrative:    CSW spoke with pt daughter, [name], and informed her that their top 2 choices, [facility] and [facility] had both approved pt following his d/c. [name] will follow up with a facility choice.    Expected Discharge Plan: Skilled Nursing Facility Barriers to Discharge: SNF Pending bed offer, Continued Medical Work up, English as a second language teacher  Expected Discharge Plan and Services       Living arrangements for the past 2 months: Single Family Home                                       Social Determinants of Health (SDOH) Interventions SDOH Screenings   Food Insecurity: No Food Insecurity (08/15/2023)  Housing: Low Risk  (08/15/2023)  Transportation Needs: No Transportation Needs (08/15/2023)  Utilities: Not At Risk (08/15/2023)  Alcohol Screen: Low Risk  (07/12/2023)  Depression (PHQ2-9): High Risk (07/12/2023)  Financial Resource Strain: Low Risk  (07/12/2023)  Physical Activity: Inactive (07/12/2023)  Social Connections: Moderately Isolated (08/15/2023)  Stress: No Stress Concern Present (07/12/2023)  Tobacco Use: Medium Risk (08/16/2023)  Health Literacy: Adequate Health Literacy (07/12/2023)    Readmission Risk Interventions    12/04/2022   12:13 PM  Readmission Risk Prevention Plan  Post Dischage Appt Complete  Medication Screening Complete  Transportation Screening Complete

## 2023-08-16 NOTE — Progress Notes (Signed)
 Subjective: Patient was sleeping I did not wake him.  No acute events overnight per nursing  Objective: Vital signs in last 24 hours: Temp:  [97.9 F (36.6 C)-99.2 F (37.3 C)] 97.9 F (36.6 C) (07/11 0800) Pulse Rate:  [58-67] 63 (07/11 0800) Resp:  [18-20] 18 (07/11 0800) BP: (124-145)/(46-62) 134/56 (07/11 0800) SpO2:  [92 %-96 %] 93 % (07/11 0800) FiO2 (%):  [21 %] 21 % (07/11 0038) Weight:  [127.7 kg] 127.7 kg (07/11 0540)  Assessment/Plan: #left ureteral stone #CKD   CT A/P notes 7 mm distal left ureteral stone.   Trend labs, serum creatinine at baseline   Family fed pt after NPO yesterday, case canceled. NPO today. To the OR with Dr. Francisca 1pm   > 100,000 GNR, awaiting sensitivities.  No fever, hemodynamically stable, no signs of systemic infectious process.  Leukocytosis 15.3-> 12.8   To the OR with Dr. Maryella for cystoscopy, left retrograde pyelogram, left laser lithotripsy, and left ureteral stent placement.  Tentatively 1 PM  Intake/Output from previous day: 07/10 0701 - 07/11 0700 In: 398.9 [P.O.:300; IV Piggyback:98.9] Out: 2100 [Urine:2100]  Intake/Output this shift: No intake/output data recorded.  Physical Exam:  General: sleeping CV: No cyanosis Lungs: equal chest rise   Lab Results: Recent Labs    08/14/23 1641 08/14/23 1653 08/15/23 0455  HGB 11.3* 11.9* 11.5*  HCT 34.7* 35.0* 35.3*   BMET Recent Labs    08/14/23 1641 08/14/23 1653 08/15/23 0455  NA 133* 133* 134*  K 4.2 4.2 4.1  CL 101 102 102  CO2 22  --  25  GLUCOSE 135* 135* 114*  BUN 31* 32* 35*  CREATININE 1.82* 1.80* 1.59*  CALCIUM 11.0*  --  11.1*  HGB 11.3* 11.9* 11.5*  WBC 15.3*  --  12.8*     Studies/Results: CT Head Wo Contrast Result Date: 08/14/2023 CLINICAL DATA:  Head trauma, neck trauma, mental status change of unknown cause. EXAM: CT HEAD WITHOUT CONTRAST CT CERVICAL SPINE WITHOUT CONTRAST TECHNIQUE: Multidetector CT imaging of the head and  cervical spine was performed following the standard protocol without intravenous contrast. Multiplanar CT image reconstructions of the cervical spine were also generated. RADIATION DOSE REDUCTION: This exam was performed according to the departmental dose-optimization program which includes automated exposure control, adjustment of the mA and/or kV according to patient size and/or use of iterative reconstruction technique. COMPARISON:  10/28/2020. FINDINGS: CT HEAD FINDINGS Brain: No acute intracranial hemorrhage. No CT evidence of acute infarct. Remote lacunar infarcts in the bilateral basal ganglia. Nonspecific hypoattenuation in the periventricular and subcortical white matter favored to reflect chronic microvascular ischemic changes. No edema, mass effect, or midline shift. The basilar cisterns are patent. Ventricles: The ventricles are normal. Vascular: No hyperdense vessel or unexpected calcification. Skull: No acute or aggressive finding. Orbits: Orbits are symmetric. Sinuses: Mucosal thickening throughout the paranasal sinuses most pronounced in the sphenoid sinuses with possible small air-fluid levels. Other: Small left mastoid effusion. CT CERVICAL SPINE FINDINGS Alignment: Straightening of the normal cervical lordosis. No significant listhesis. No facet subluxation or dislocation. Skull base and vertebrae: Anterior cervical fusion hardware at C4-5. Similar chronic fusion extending from C2 to the upper thoracic spine. Diffuse osteopenia. No evidence of compression fracture or displaced fracture in the cervical spine. No destructive osseous lesion. Soft tissues and spinal canal: No prevertebral fluid or swelling. No visible canal hematoma. Disc levels: Osseous fusion across multiple disc levels throughout the cervical spine. Disc osteophyte complexes at multiple levels.  No high-grade osseous spinal canal stenosis. Facet arthrosis throughout the cervical spine. Prominent degenerative changes at the atlanto  dens articulation. Asymmetric degenerative changes at the right atlantoaxial articulation. Upper chest: Paraseptal and centrilobular emphysema. Other: None. IMPRESSION: No CT evidence of acute intracranial abnormality. No acute fracture or traumatic malalignment of the cervical spine. Mucosal thickening in the paranasal sinuses with air-fluid levels in the sphenoid sinuses. Recommend correlation for acute sinusitis. Chronic and degenerative changes as above. Electronically Signed   By: Donnice Mania M.D.   On: 08/14/2023 21:09   CT Cervical Spine Wo Contrast Result Date: 08/14/2023 CLINICAL DATA:  Head trauma, neck trauma, mental status change of unknown cause. EXAM: CT HEAD WITHOUT CONTRAST CT CERVICAL SPINE WITHOUT CONTRAST TECHNIQUE: Multidetector CT imaging of the head and cervical spine was performed following the standard protocol without intravenous contrast. Multiplanar CT image reconstructions of the cervical spine were also generated. RADIATION DOSE REDUCTION: This exam was performed according to the departmental dose-optimization program which includes automated exposure control, adjustment of the mA and/or kV according to patient size and/or use of iterative reconstruction technique. COMPARISON:  10/28/2020. FINDINGS: CT HEAD FINDINGS Brain: No acute intracranial hemorrhage. No CT evidence of acute infarct. Remote lacunar infarcts in the bilateral basal ganglia. Nonspecific hypoattenuation in the periventricular and subcortical white matter favored to reflect chronic microvascular ischemic changes. No edema, mass effect, or midline shift. The basilar cisterns are patent. Ventricles: The ventricles are normal. Vascular: No hyperdense vessel or unexpected calcification. Skull: No acute or aggressive finding. Orbits: Orbits are symmetric. Sinuses: Mucosal thickening throughout the paranasal sinuses most pronounced in the sphenoid sinuses with possible small air-fluid levels. Other: Small left mastoid  effusion. CT CERVICAL SPINE FINDINGS Alignment: Straightening of the normal cervical lordosis. No significant listhesis. No facet subluxation or dislocation. Skull base and vertebrae: Anterior cervical fusion hardware at C4-5. Similar chronic fusion extending from C2 to the upper thoracic spine. Diffuse osteopenia. No evidence of compression fracture or displaced fracture in the cervical spine. No destructive osseous lesion. Soft tissues and spinal canal: No prevertebral fluid or swelling. No visible canal hematoma. Disc levels: Osseous fusion across multiple disc levels throughout the cervical spine. Disc osteophyte complexes at multiple levels. No high-grade osseous spinal canal stenosis. Facet arthrosis throughout the cervical spine. Prominent degenerative changes at the atlanto dens articulation. Asymmetric degenerative changes at the right atlantoaxial articulation. Upper chest: Paraseptal and centrilobular emphysema. Other: None. IMPRESSION: No CT evidence of acute intracranial abnormality. No acute fracture or traumatic malalignment of the cervical spine. Mucosal thickening in the paranasal sinuses with air-fluid levels in the sphenoid sinuses. Recommend correlation for acute sinusitis. Chronic and degenerative changes as above. Electronically Signed   By: Donnice Mania M.D.   On: 08/14/2023 21:09   DG Tibia/Fibula Left Result Date: 08/14/2023 CLINICAL DATA:  Fall, leg pain EXAM: LEFT TIBIA AND FIBULA - 2 VIEW COMPARISON:  Knee series 10/28/2020 FINDINGS: Prior left knee replacement No acute bony abnormality. Specifically, no fracture, subluxation, or dislocation. Soft tissues are intact. IMPRESSION: No acute bony abnormality. Electronically Signed   By: Franky Crease M.D.   On: 08/14/2023 21:04   DG Foot 2 Views Left Result Date: 08/14/2023 CLINICAL DATA:  Fall, leg pain EXAM: LEFT FOOT - 2 VIEW COMPARISON:  None Available. FINDINGS: Plantar and posterior calcaneal spurs. No acute bony abnormality.  Specifically, no fracture, subluxation, or dislocation. Joint space narrowing in the IP joints and 1st MTP joint. Soft tissues are intact. IMPRESSION: No acute bony abnormality.  Electronically Signed   By: Franky Crease M.D.   On: 08/14/2023 21:03   CT Renal Stone Study Result Date: 08/14/2023 CLINICAL DATA:  Flank pain weakness EXAM: CT ABDOMEN AND PELVIS WITHOUT CONTRAST TECHNIQUE: Multidetector CT imaging of the abdomen and pelvis was performed following the standard protocol without IV contrast. RADIATION DOSE REDUCTION: This exam was performed according to the departmental dose-optimization program which includes automated exposure control, adjustment of the mA and/or kV according to patient size and/or use of iterative reconstruction technique. COMPARISON:  CT 12/03/2022 FINDINGS: Lower chest: Lung bases demonstrate patchy airspace disease at the left base. Dense right lower lobe airspace disease. Cardiomegaly with coronary vascular calcification Hepatobiliary: Gallstones. No focal hepatic abnormality or biliary dilatation Pancreas: Unremarkable. No pancreatic ductal dilatation or surrounding inflammatory changes. Spleen: Normal in size without focal abnormality. Adrenals/Urinary Tract: Adrenal glands are normal. Stable for left renal pelvis dilatation. There is dilatation of the mid left ureter with evidence for a 7 mm stone in the distal left ureter several cm proximal to the left UVJ. Multiple punctate right kidney stones. Multiple left-sided kidney stones, these measure up to 18 mm at the midpole. Bladder is unremarkable Stomach/Bowel: Stomach nonenlarged. No dilated small bowel. No acute bowel wall thickening. Negative appendix. Vascular/Lymphatic: Aortic atherosclerosis. No enlarged abdominal or pelvic lymph nodes. Reproductive: Prostate is unremarkable. Other: Negative for pelvic effusion or free air Musculoskeletal: No acute osseous abnormality. Diffuse ankylosis of the spine and SI joints.  IMPRESSION: 1. 7 mm stone in the distal left ureter several cm proximal to the left UVJ with mild hydroureter of the upstream mid ureter. There is mild dilatation of left renal pelvis but stable as compared with CT from October and more decompressed appearance of the proximal left ureter between the renal pelvis and dilated ureteral segment. 2. Multiple bilateral kidney stones. 3. Gallstones. 4. Bibasilar airspace disease, right greater than left, suspicious for pneumonia and or aspiration. 5. Aortic atherosclerosis. Aortic Atherosclerosis (ICD10-I70.0). Electronically Signed   By: Luke Bun M.D.   On: 08/14/2023 20:29   DG Chest 2 View Result Date: 08/14/2023 CLINICAL DATA:  Shortness of breath EXAM: CHEST - 2 VIEW COMPARISON:  12/04/2022, 01/22/2022 FINDINGS: Lateral views are limited by patient's arms. Cardiomegaly with vascular congestion and mild diffuse interstitial opacity suggestive of mild edema. Suspect also small pleural effusions. IMPRESSION: Cardiomegaly with vascular congestion and mild interstitial edema. Suspect small pleural effusions. Electronically Signed   By: Luke Bun M.D.   On: 08/14/2023 18:06      LOS: 2 days   Ole Bourdon, NP [facility] Pager: [phone]  08/16/2023, 8:35 AM

## 2023-08-16 NOTE — Progress Notes (Signed)
 [facility] night cross cover note:  Nystatin  swish and swallow added for oral thrush.     Eva Pore, DO Hospitalist

## 2023-08-16 NOTE — Interval H&P Note (Signed)
 UROLOGY H&P UPDATE  Agree with prior H&P dated 08/15/2023 by Ole Bras and Dr. Nieves.  Comorbid 83 year old male with 7 mm left distal ureteral stone, questionable UTI, has been on antibiotics.  Afebrile, leukocytosis resolving.  After long conversation, family has opted for intervention, will attempt to remove left ureteral stone, and will not address multiple left renal stones.  Possible need for left ureteral stent placement if any evidence of purulence intraoperatively with need for delayed management.  Cardiac: RRR Lungs: CTA bilaterally  Laterality: Left Procedure: Left ureteroscopy, laser lithotripsy, stent placement  We specifically discussed the risks ureteroscopy including bleeding, infection/sepsis, stent related symptoms including flank pain/urgency/frequency/incontinence/dysuria, ureteral injury, ureteral stricture, inability to access stone, or need for staged or additional procedures.    Redell JAYSON Burnet, MD 08/16/2023

## 2023-08-17 ENCOUNTER — Encounter (HOSPITAL_COMMUNITY): Payer: Self-pay | Admitting: Urology

## 2023-08-17 DIAGNOSIS — N390 Urinary tract infection, site not specified: Secondary | ICD-10-CM | POA: Diagnosis not present

## 2023-08-17 LAB — CBC
HCT: 38.9 % — ABNORMAL LOW (ref 39.0–52.0)
Hemoglobin: 12.3 g/dL — ABNORMAL LOW (ref 13.0–17.0)
MCH: 29.7 pg (ref 26.0–34.0)
MCHC: 31.6 g/dL (ref 30.0–36.0)
MCV: 94 fL (ref 80.0–100.0)
Platelets: 348 K/uL (ref 150–400)
RBC: 4.14 MIL/uL — ABNORMAL LOW (ref 4.22–5.81)
RDW: 12.4 % (ref 11.5–15.5)
WBC: 15.3 K/uL — ABNORMAL HIGH (ref 4.0–10.5)
nRBC: 0 % (ref 0.0–0.2)

## 2023-08-17 LAB — BASIC METABOLIC PANEL WITH GFR
Anion gap: 8 (ref 5–15)
BUN: 43 mg/dL — ABNORMAL HIGH (ref 8–23)
CO2: 26 mmol/L (ref 22–32)
Calcium: 11.7 mg/dL — ABNORMAL HIGH (ref 8.9–10.3)
Chloride: 102 mmol/L (ref 98–111)
Creatinine, Ser: 1.61 mg/dL — ABNORMAL HIGH (ref 0.61–1.24)
GFR, Estimated: 42 mL/min — ABNORMAL LOW (ref >60)
Glucose, Bld: 197 mg/dL — ABNORMAL HIGH (ref 70–99)
Potassium: 5 mmol/L (ref 3.5–5.1)
Sodium: 136 mmol/L (ref 135–145)

## 2023-08-17 MED ORDER — CEFUROXIME AXETIL 250 MG PO TABS
500.0000 mg | ORAL_TABLET | Freq: Two times a day (BID) | ORAL | Status: DC
  Administered 2023-08-17 – 2023-08-21 (×8): 500 mg via ORAL
  Filled 2023-08-17 (×9): qty 2

## 2023-08-17 MED ORDER — VITAMIN D (ERGOCALCIFEROL) 1.25 MG (50000 UNIT) PO CAPS
50000.0000 [IU] | ORAL_CAPSULE | ORAL | Status: DC
  Administered 2023-08-17: 50000 [IU] via ORAL
  Filled 2023-08-17: qty 1

## 2023-08-17 NOTE — Plan of Care (Signed)

## 2023-08-17 NOTE — Progress Notes (Signed)
 PROGRESS NOTE    [name]  FMW:[id number] DOB: 01-20-1941 DOA: 08/14/2023 PCP: Alphonsa Glendia LABOR, MD   Brief Narrative:  [name] is a 83 y.o. male with hx of CAD, HFpEF, COPD on 4 L O2, OSA on BiPAP, ulcerative colitis, ankylosing spondylitis, hypertension, hyperlipidemia, CKD 3A, primary  hyperparathyroidism, morbid obesity, mood disorder, dementia, who presented with progressive fatigue, culminating in a ground-level fall.  Assessment & Plan:   Principal Problem:   Complicated UTI (urinary tract infection) Active Problems:   CAP (community acquired pneumonia)   Failure to thrive in adult  Failure to thrive Ambulatory dysfunction, acute on chronic - ground-level fall Rule out overt dysphagia Rule out concurrent thrush -Progressive weakness over the past year with multiple falls, imaging negative at this time - PT OT to follow, appreciate insight recommendations - Appreciate speech evaluation -no clear signs or symptoms of overt speech dysfunction/dysphagia, continue dysphagia 3 diet thin liquids and follow clinically - Continue oral nystatin    Complicated Proteus urinary tract infection 7 mm distal left ureteral stone with hydroureter - Malodorous urine with notable bacteria on UA and pyuria - Continue ceftriaxone  x 7 days, culture resistant only to nitrofurantoin - Cystoscopy with lithotripsy and ureteral stenting 7/11 - tolerated procedure well per urology -appreciate insight recommendations -Plan for stent removal in 1 to 2 weeks with Dr. Watt outpatient   Chronic hypoxic respiratory failure  Rule out aspiration pneumonitis versus possible underlying community-acquired pneumonia  - Viral panel negative  - Recent sick contacts in the family however no indication for antivirals given timing and negative respiratory PCR - Treat empirically with ceftriaxone  in setting of above with azithromycin  x 5 days - Patient is without acute hypoxia but has known chronic  hypoxia on 4 L nasal cannula with BiPAP at night  CAD: continue home aspirin . Continue home pravastatin .  Continue home isosorbide  twice daily,  HFpEF, without acute exacerbation:  - Most recent echo December 2023: LVEF 55 to 60%, moderate LVH, indeterminate diastolic dysfunction.  - Minimally elevated BNP - Fluids given at intake, will resume home p.o. diuretics in the next 24 to 48 hours pending volume status and renal recovery  COPD: On 4 L O2 at home.  Continue supportive care, nebs  OSA: BiPAP nightly  Ulcerative colitis, ankylosing spondylitis: Not on active therapy  Hypertension: Continue carvedilol  ; hold on his amlodipine , hydralazine , spironolactone   HLD: Continue statin   CKD 3A: Baseline creatinine near 1.7.  Primary hyperparathyroidism: He has mild hypercalcemia at 22 which is similar to previous values.  If this is uptrending would give both hydration and IV Lasix   Morbid obesity: Reports losing weight in the outpatient setting, continue to trend Mood disorder: Continue home duloxetine  History of dementia: Not on any active therapy, unspecified type BPH: continue home Tamsulosin    DVT prophylaxis: SCDs Start: 08/14/23 2154 Code Status:   Code Status: Limited: Do not attempt resuscitation (DNR) -DNR-LIMITED -Do Not Intubate/DNI  Family Communication: None present  Status is: Inpatient  Dispo: The patient is from: Home              Anticipated d/c is to: SNF              Anticipated d/c date is: 24 to 48 hours              Patient currently not medically stable for discharge  Consultants:  Urology  Procedures:  Potential cystoscopy 7/10  Antimicrobials:  Ceftriaxone  azithromycin   Subjective: No acute issues  or events overnight, denies nausea vomiting diarrhea constipation headache fevers chills or chest pain  Objective: Vitals:   08/16/23 1928 08/16/23 1951 08/17/23 0010 08/17/23 0407  BP: (!) 138/51 126/75 (!) 143/55 (!) 133/51  Pulse: (!) 59 85 (!)  53 (!) 53  Resp: 18 19 17 16   Temp: 97.7 F (36.5 C) (!) 97.4 F (36.3 C) (!) 97.5 F (36.4 C) (!) 97.5 F (36.4 C)  TempSrc:      SpO2: 92% 99% 95% 93%  Weight:      Height:        Intake/Output Summary (Last 24 hours) at 08/17/2023 0728 Last data filed at 08/17/2023 0500 Gross per 24 hour  Intake 900 ml  Output 550 ml  Net 350 ml   Filed Weights   08/16/23 0540 08/16/23 1143  Weight: 127.7 kg (P) 127.7 kg    Examination:  General:  Pleasantly resting in bed, No acute distress.  Somnolent but easily arousable HEENT:  Normocephalic atraumatic.  Sclerae nonicteric, noninjected.  Extraocular movements intact bilaterally. Neck:  Without mass or deformity.  Trachea is midline. Lungs:  Clear to auscultate bilaterally without rhonchi, wheeze, or rales. Heart:  Regular rate and rhythm.  Without murmurs, rubs, or gallops. Abdomen:  Soft, nontender, obese, nondistended.  Without guarding or rebound. Extremities: Without cyanosis, clubbing, edema, or obvious deformity. Skin:  Warm and dry, no erythema.  Data Reviewed: I have personally reviewed following labs and imaging studies  CBC: Recent Labs  Lab 08/14/23 1641 08/14/23 1653 08/15/23 0455 08/17/23 0319  WBC 15.3*  --  12.8* 15.3*  NEUTROABS 9.9*  --   --   --   HGB 11.3* 11.9* 11.5* 12.3*  HCT 34.7* 35.0* 35.3* 38.9*  MCV 91.8  --  91.5 94.0  PLT 291  --  284 348   Basic Metabolic Panel: Recent Labs  Lab 08/14/23 1641 08/14/23 1653 08/15/23 0455 08/17/23 0319  NA 133* 133* 134* 136  K 4.2 4.2 4.1 5.0  CL 101 102 102 102  CO2 22  --  25 26  GLUCOSE 135* 135* 114* 197*  BUN 31* 32* 35* 43*  CREATININE 1.82* 1.80* 1.59* 1.61*  CALCIUM 11.0*  --  11.1* 11.7*  MG  --   --  2.1  --   PHOS  --   --  3.0  --    GFR: Estimated Creatinine Clearance: 47.8 mL/min (A) (by C-G formula based on SCr of 1.61 mg/dL (H)). Liver Function Tests: Recent Labs  Lab 08/14/23 1641  AST 30  ALT 23  ALKPHOS 73  BILITOT 1.0   PROT 7.0  ALBUMIN  2.5*   Thyroid  Function Tests: Recent Labs    08/15/23 0455  TSH 1.853   Sepsis Labs: Recent Labs  Lab 08/14/23 1654 08/14/23 1844  LATICACIDVEN 1.0 0.8    Recent Results (from the past 240 hours)  Urine Culture     Status: Abnormal   Collection Time: 08/14/23  6:00 PM   Specimen: Urine, Random  Result Value Ref Range Status   Specimen Description URINE, RANDOM  Final   Special Requests   Final    NONE Reflexed from [id number] Performed at [facility], [address] [place], [place] [address]    Culture >=100,000 COLONIES/mL PROTEUS MIRABILIS (A)  Final   Report Status 08/16/2023 FINAL  Final   Organism ID, Bacteria PROTEUS MIRABILIS (A)  Final      Susceptibility   Proteus mirabilis - MIC*  AMPICILLIN <=2 SENSITIVE Sensitive     CEFAZOLIN  8 SENSITIVE Sensitive     CEFEPIME  <=0.12 SENSITIVE Sensitive     CEFTRIAXONE  <=0.25 SENSITIVE Sensitive     CIPROFLOXACIN  <=0.25 SENSITIVE Sensitive     GENTAMICIN <=1 SENSITIVE Sensitive     IMIPENEM 4 SENSITIVE Sensitive     NITROFURANTOIN 256 RESISTANT Resistant     TRIMETH/SULFA <=20 SENSITIVE Sensitive     AMPICILLIN/SULBACTAM <=2 SENSITIVE Sensitive     PIP/TAZO <=4 SENSITIVE Sensitive ug/mL    * >=100,000 COLONIES/mL PROTEUS MIRABILIS  Respiratory (~20 pathogens) panel by PCR     Status: None   Collection Time: 08/14/23  8:15 PM   Specimen: Urine, Clean Catch; Respiratory  Result Value Ref Range Status   Adenovirus NOT DETECTED NOT DETECTED Final   Coronavirus 229E NOT DETECTED NOT DETECTED Final    Comment: (NOTE) The Coronavirus on the Respiratory Panel, DOES NOT test for the novel  Coronavirus (2019 nCoV)    Coronavirus HKU1 NOT DETECTED NOT DETECTED Final   Coronavirus NL63 NOT DETECTED NOT DETECTED Final   Coronavirus OC43 NOT DETECTED NOT DETECTED Final   Metapneumovirus NOT DETECTED NOT DETECTED Final   Rhinovirus / Enterovirus NOT DETECTED NOT DETECTED Final   Influenza A NOT  DETECTED NOT DETECTED Final   Influenza B NOT DETECTED NOT DETECTED Final   Parainfluenza Virus 1 NOT DETECTED NOT DETECTED Final   Parainfluenza Virus 2 NOT DETECTED NOT DETECTED Final   Parainfluenza Virus 3 NOT DETECTED NOT DETECTED Final   Parainfluenza Virus 4 NOT DETECTED NOT DETECTED Final   Respiratory Syncytial Virus NOT DETECTED NOT DETECTED Final   Bordetella pertussis NOT DETECTED NOT DETECTED Final   Bordetella Parapertussis NOT DETECTED NOT DETECTED Final   Chlamydophila pneumoniae NOT DETECTED NOT DETECTED Final   Mycoplasma pneumoniae NOT DETECTED NOT DETECTED Final    Comment: Performed at [facility], [address] [place], [place] [address]  Culture, blood (Routine X 2) w Reflex to ID Panel     Status: None (Preliminary result)   Collection Time: 08/14/23 10:12 PM   Specimen: BLOOD RIGHT HAND  Result Value Ref Range Status   Specimen Description BLOOD RIGHT HAND  Final   Special Requests   Final    BOTTLES DRAWN AEROBIC AND ANAEROBIC Blood Culture adequate volume   Culture   Final    NO GROWTH 2 DAYS Performed at [facility], [address] [place], [place] [address]    Report Status PENDING  Incomplete  Culture, blood (Routine X 2) w Reflex to ID Panel     Status: None (Preliminary result)   Collection Time: 08/14/23 10:12 PM   Specimen: BLOOD LEFT HAND  Result Value Ref Range Status   Specimen Description BLOOD LEFT HAND  Final   Special Requests   Final    BOTTLES DRAWN AEROBIC AND ANAEROBIC Blood Culture adequate volume   Culture   Final    NO GROWTH 2 DAYS Performed at [facility], [address] [place], [place] [address]    Report Status PENDING  Incomplete         Radiology Studies: DG C-Arm 1-60 Min-No Report Result Date: 08/16/2023 Fluoroscopy was utilized by the requesting physician.  No radiographic interpretation.    Scheduled Meds:  aspirin  EC  81 mg Oral Daily   carvedilol   12.5 mg Oral BID WC   DULoxetine    60 mg Oral Daily   feeding supplement  237 mL  Oral BID BM   isosorbide  mononitrate  30 mg Oral Daily   nystatin   5 mL Mouth/Throat TID AC & HS   pravastatin   40 mg Oral Daily   sodium chloride  flush  3 mL Intravenous Q12H   tamsulosin   0.4 mg Oral Daily   torsemide   20 mg Oral Daily   Continuous Infusions:  cefTRIAXone  (ROCEPHIN )  IV 1 g (08/16/23 2128)     LOS: 3 days   Time spent:  Elsie JAYSON [place]clair, DO [facility]  If 7PM-7AM, please contact night-coverage [email or web address]  08/17/2023, 7:28 AM

## 2023-08-18 DIAGNOSIS — N390 Urinary tract infection, site not specified: Secondary | ICD-10-CM | POA: Diagnosis not present

## 2023-08-18 LAB — BASIC METABOLIC PANEL WITH GFR
Anion gap: 8 (ref 5–15)
BUN: 48 mg/dL — ABNORMAL HIGH (ref 8–23)
CO2: 26 mmol/L (ref 22–32)
Calcium: 12.5 mg/dL — ABNORMAL HIGH (ref 8.9–10.3)
Chloride: 103 mmol/L (ref 98–111)
Creatinine, Ser: 1.47 mg/dL — ABNORMAL HIGH (ref 0.61–1.24)
GFR, Estimated: 47 mL/min — ABNORMAL LOW (ref >60)
Glucose, Bld: 132 mg/dL — ABNORMAL HIGH (ref 70–99)
Potassium: 4.7 mmol/L (ref 3.5–5.1)
Sodium: 137 mmol/L (ref 135–145)

## 2023-08-18 LAB — CBC
HCT: 37.6 % — ABNORMAL LOW (ref 39.0–52.0)
Hemoglobin: 12 g/dL — ABNORMAL LOW (ref 13.0–17.0)
MCH: 29.4 pg (ref 26.0–34.0)
MCHC: 31.9 g/dL (ref 30.0–36.0)
MCV: 92.2 fL (ref 80.0–100.0)
Platelets: 378 K/uL (ref 150–400)
RBC: 4.08 MIL/uL — ABNORMAL LOW (ref 4.22–5.81)
RDW: 12.3 % (ref 11.5–15.5)
WBC: 16.5 K/uL — ABNORMAL HIGH (ref 4.0–10.5)
nRBC: 0 % (ref 0.0–0.2)

## 2023-08-18 NOTE — Progress Notes (Signed)
 PROGRESS NOTE    [name]  FMW:[id number] DOB: 09-Jun-1940 DOA: 08/14/2023 PCP: Alphonsa Glendia LABOR, MD   Brief Narrative:  [name] is a 83 y.o. male with hx of CAD, HFpEF, COPD on 4 L O2, OSA on BiPAP, ulcerative colitis, ankylosing spondylitis, hypertension, hyperlipidemia, CKD 3A, primary  hyperparathyroidism, morbid obesity, mood disorder, dementia, who presented with progressive fatigue, culminating in a ground-level fall.  Assessment & Plan:   Principal Problem:   Complicated UTI (urinary tract infection) Active Problems:   CAP (community acquired pneumonia)   Failure to thrive in adult  Failure to thrive Ambulatory dysfunction, acute on chronic - ground-level fall Rule out overt dysphagia Rule out concurrent thrush -Progressive weakness over the past year with multiple falls, imaging negative at this time - PT OT to follow, appreciate insight recommendations - Appreciate speech evaluation -no clear signs or symptoms of overt speech dysfunction/dysphagia, continue dysphagia 3 diet thin liquids and follow clinically - Continue oral nystatin    Severe sepsis secondary to complicated Proteus urinary tract infection 7 mm distal left ureteral stone with hydroureter - Malodorous urine with notable bacteria on UA and pyuria with leukocytosis, tachycardia, altered mental status. - Continue ceftriaxone  x 7 days, culture resistant only to nitrofurantoin - Cystoscopy with lithotripsy and ureteral stenting 7/11 - tolerated procedure well per urology -appreciate insight recommendations -Plan for stent removal in 1 to 2 weeks with Dr. Watt outpatient - Leukocytosis uptrending mildly after stone destruction, not unexpected given likely nidus for infection now exposed  Chronic hypoxic respiratory failure  Rule out aspiration pneumonitis versus possible underlying community-acquired pneumonia  - Viral panel negative  - Recent sick contacts in the family however no indication  for antivirals given timing and negative respiratory PCR - Treat empirically with ceftriaxone  in setting of above with azithromycin  x 5 days - Patient is without acute hypoxia but has known chronic hypoxia on 4 L nasal cannula with BiPAP at night  CAD: continue home aspirin . Continue home pravastatin .  Continue home isosorbide  twice daily,  HFpEF, without acute exacerbation:  - Most recent echo December 2023: LVEF 55 to 60%, moderate LVH, indeterminate diastolic dysfunction.  - Minimally elevated BNP - Fluids given at intake, will resume home p.o. diuretics in the next 24 to 48 hours pending volume status and renal recovery  COPD: On 4 L O2 at home.  Continue supportive care, nebs  OSA: BiPAP nightly  Ulcerative colitis, ankylosing spondylitis: Not on active therapy  Hypertension: Continue carvedilol  ; hold on his amlodipine , hydralazine , spironolactone   HLD: Continue statin   CKD 3A: Baseline creatinine near 1.7.  Primary hyperparathyroidism: He has mild hypercalcemia at 34 which is similar to previous values.  If this is uptrending would give both hydration and IV Lasix   Morbid obesity: Reports losing weight in the outpatient setting, continue to trend Mood disorder: Continue home duloxetine  History of dementia: Not on any active therapy, unspecified type BPH: continue home Tamsulosin    DVT prophylaxis: SCDs Start: 08/14/23 2154 Code Status:   Code Status: Limited: Do not attempt resuscitation (DNR) -DNR-LIMITED -Do Not Intubate/DNI  Family Communication: Daughter updated at bedside, daughter updated over the phone  Status is: Inpatient  Dispo: The patient is from: Home              Anticipated d/c is to: SNF              Anticipated d/c date is: 24 to 48 hours  Patient currently not medically stable for discharge  Consultants:  Urology  Procedures:  Potential cystoscopy 7/10  Antimicrobials:  Ceftriaxone  azithromycin   Subjective: No acute issues or  events overnight, denies nausea vomiting diarrhea constipation headache fevers chills or chest pain  Objective: Vitals:   08/17/23 1954 08/18/23 0017 08/18/23 0353 08/18/23 0459  BP: (!) 147/60 (!) 161/56 (!) 153/65   Pulse: 60 (!) 51 (!) 56   Resp: 19 19 19    Temp: 97.6 F (36.4 C) 97.6 F (36.4 C) 97.9 F (36.6 C)   TempSrc:      SpO2: 93% 90% 90%   Weight:    127.7 kg  Height:        Intake/Output Summary (Last 24 hours) at 08/18/2023 0757 Last data filed at 08/18/2023 0500 Gross per 24 hour  Intake 460 ml  Output 2200 ml  Net -1740 ml   Filed Weights   08/16/23 0540 08/16/23 1143 08/18/23 0459  Weight: 127.7 kg (P) 127.7 kg 127.7 kg    Examination:  General:  Pleasantly resting in bed, No acute distress.  Somnolent but easily arousable HEENT:  Normocephalic atraumatic.  Sclerae nonicteric, noninjected.  Extraocular movements intact bilaterally. Neck:  Without mass or deformity.  Trachea is midline. Lungs:  Clear to auscultate bilaterally without rhonchi, wheeze, or rales. Heart:  Regular rate and rhythm.  Without murmurs, rubs, or gallops. Abdomen:  Soft, nontender, obese, nondistended.  Without guarding or rebound. Extremities: Without cyanosis, clubbing, edema, or obvious deformity. Skin:  Warm and dry, no erythema.  Data Reviewed: I have personally reviewed following labs and imaging studies  CBC: Recent Labs  Lab 08/14/23 1641 08/14/23 1653 08/15/23 0455 08/17/23 0319 08/18/23 0524  WBC 15.3*  --  12.8* 15.3* 16.5*  NEUTROABS 9.9*  --   --   --   --   HGB 11.3* 11.9* 11.5* 12.3* 12.0*  HCT 34.7* 35.0* 35.3* 38.9* 37.6*  MCV 91.8  --  91.5 94.0 92.2  PLT 291  --  284 348 378   Basic Metabolic Panel: Recent Labs  Lab 08/14/23 1641 08/14/23 1653 08/15/23 0455 08/17/23 0319  NA 133* 133* 134* 136  K 4.2 4.2 4.1 5.0  CL 101 102 102 102  CO2 22  --  25 26  GLUCOSE 135* 135* 114* 197*  BUN 31* 32* 35* 43*  CREATININE 1.82* 1.80* 1.59* 1.61*   CALCIUM 11.0*  --  11.1* 11.7*  MG  --   --  2.1  --   PHOS  --   --  3.0  --    GFR: Estimated Creatinine Clearance: 47.8 mL/min (A) (by C-G formula based on SCr of 1.61 mg/dL (H)). Liver Function Tests: Recent Labs  Lab 08/14/23 1641  AST 30  ALT 23  ALKPHOS 73  BILITOT 1.0  PROT 7.0  ALBUMIN  2.5*   Thyroid  Function Tests: No results for input(s): TSH, T4TOTAL, FREET4, T3FREE, THYROIDAB in the last 72 hours.  Sepsis Labs: Recent Labs  Lab 08/14/23 1654 08/14/23 1844  LATICACIDVEN 1.0 0.8    Recent Results (from the past 240 hours)  Urine Culture     Status: Abnormal   Collection Time: 08/14/23  6:00 PM   Specimen: Urine, Random  Result Value Ref Range Status   Specimen Description URINE, RANDOM  Final   Special Requests   Final    NONE Reflexed from [id number] Performed at [facility], [address], [place], [place] [address]    Culture >=100,000 COLONIES/mL  PROTEUS MIRABILIS (A)  Final   Report Status 08/16/2023 FINAL  Final   Organism ID, Bacteria PROTEUS MIRABILIS (A)  Final      Susceptibility   Proteus mirabilis - MIC*    AMPICILLIN <=2 SENSITIVE Sensitive     CEFAZOLIN  8 SENSITIVE Sensitive     CEFEPIME  <=0.12 SENSITIVE Sensitive     CEFTRIAXONE  <=0.25 SENSITIVE Sensitive     CIPROFLOXACIN  <=0.25 SENSITIVE Sensitive     GENTAMICIN <=1 SENSITIVE Sensitive     IMIPENEM 4 SENSITIVE Sensitive     NITROFURANTOIN 256 RESISTANT Resistant     TRIMETH/SULFA <=20 SENSITIVE Sensitive     AMPICILLIN/SULBACTAM <=2 SENSITIVE Sensitive     PIP/TAZO <=4 SENSITIVE Sensitive ug/mL    * >=100,000 COLONIES/mL PROTEUS MIRABILIS  Respiratory (~20 pathogens) panel by PCR     Status: None   Collection Time: 08/14/23  8:15 PM   Specimen: Urine, Clean Catch; Respiratory  Result Value Ref Range Status   Adenovirus NOT DETECTED NOT DETECTED Final   Coronavirus 229E NOT DETECTED NOT DETECTED Final    Comment: (NOTE) The Coronavirus on the Respiratory Panel,  DOES NOT test for the novel  Coronavirus (2019 nCoV)    Coronavirus HKU1 NOT DETECTED NOT DETECTED Final   Coronavirus NL63 NOT DETECTED NOT DETECTED Final   Coronavirus OC43 NOT DETECTED NOT DETECTED Final   Metapneumovirus NOT DETECTED NOT DETECTED Final   Rhinovirus / Enterovirus NOT DETECTED NOT DETECTED Final   Influenza A NOT DETECTED NOT DETECTED Final   Influenza B NOT DETECTED NOT DETECTED Final   Parainfluenza Virus 1 NOT DETECTED NOT DETECTED Final   Parainfluenza Virus 2 NOT DETECTED NOT DETECTED Final   Parainfluenza Virus 3 NOT DETECTED NOT DETECTED Final   Parainfluenza Virus 4 NOT DETECTED NOT DETECTED Final   Respiratory Syncytial Virus NOT DETECTED NOT DETECTED Final   Bordetella pertussis NOT DETECTED NOT DETECTED Final   Bordetella Parapertussis NOT DETECTED NOT DETECTED Final   Chlamydophila pneumoniae NOT DETECTED NOT DETECTED Final   Mycoplasma pneumoniae NOT DETECTED NOT DETECTED Final    Comment: Performed at [facility], [address], [place], [place] [address]  Culture, blood (Routine X 2) w Reflex to ID Panel     Status: None (Preliminary result)   Collection Time: 08/14/23 10:12 PM   Specimen: BLOOD RIGHT HAND  Result Value Ref Range Status   Specimen Description BLOOD RIGHT HAND  Final   Special Requests   Final    BOTTLES DRAWN AEROBIC AND ANAEROBIC Blood Culture adequate volume   Culture   Final    NO GROWTH 3 DAYS Performed at [facility], [address] [place], [place] [address]    Report Status PENDING  Incomplete  Culture, blood (Routine X 2) w Reflex to ID Panel     Status: None (Preliminary result)   Collection Time: 08/14/23 10:12 PM   Specimen: BLOOD LEFT HAND  Result Value Ref Range Status   Specimen Description BLOOD LEFT HAND  Final   Special Requests   Final    BOTTLES DRAWN AEROBIC AND ANAEROBIC Blood Culture adequate volume   Culture   Final    NO GROWTH 3 DAYS Performed at [facility], [address], [place], [place] [address]    Report Status PENDING  Incomplete         Radiology Studies: DG C-Arm 1-60 Min-No Report Result Date: 08/16/2023 Fluoroscopy was utilized by the requesting physician.  No radiographic  interpretation.    Scheduled Meds:  aspirin  EC  81 mg Oral Daily   carvedilol   12.5 mg Oral BID WC   cefUROXime   500 mg Oral BID WC   DULoxetine   60 mg Oral Daily   feeding supplement  237 mL Oral BID BM   isosorbide  mononitrate  30 mg Oral Daily   nystatin   5 mL Mouth/Throat TID AC & HS   pravastatin   40 mg Oral Daily   sodium chloride  flush  3 mL Intravenous Q12H   tamsulosin   0.4 mg Oral Daily   torsemide   20 mg Oral Daily   Vitamin D  (Ergocalciferol )  50,000 Units Oral Q Sat   Continuous Infusions:     LOS: 4 days   Time spent:  Elsie JAYSON Montclair, DO [facility]  If 7PM-7AM, please contact night-coverage [email or web address]  08/18/2023, 7:57 AM

## 2023-08-19 DIAGNOSIS — N390 Urinary tract infection, site not specified: Secondary | ICD-10-CM | POA: Diagnosis not present

## 2023-08-19 LAB — CULTURE, BLOOD (ROUTINE X 2)
Culture: NO GROWTH
Culture: NO GROWTH
Special Requests: ADEQUATE
Special Requests: ADEQUATE

## 2023-08-19 MED ORDER — ADULT MULTIVITAMIN W/MINERALS CH
1.0000 | ORAL_TABLET | Freq: Every day | ORAL | Status: DC
  Administered 2023-08-19 – 2023-08-21 (×3): 1 via ORAL
  Filled 2023-08-19 (×3): qty 1

## 2023-08-19 NOTE — Plan of Care (Signed)

## 2023-08-19 NOTE — Care Management Important Message (Signed)
 Important Message  Patient Details  Name: [name] MRN: [id number] Date of Birth: 09-Aug-1940   Important Message Given:  Yes - Medicare IM     Claretta Deed 08/19/2023, 3:37 PM

## 2023-08-19 NOTE — TOC Progression Note (Addendum)
 Transition of Care [facility]) - Progression Note    Patient Details  Name: [name] MRN: [id number] Date of Birth: [birth year]  Transition of Care [facility]) CM/SW Contact  Sallyann Kinnaird A [place], LCSW Phone Number: 08/19/2023, 12:02 PM  Clinical Narrative:     1630 CSW notified by Garrel at facility that bed available Wednesday/Thursday at [facility].  CSW met with pt and pt's daughter [name] at bedside. She stated they chose [facility] for rehab. CSW to start insurance authorization.    TOC will continue to follow.    Expected Discharge Plan: Skilled Nursing Facility Barriers to Discharge: SNF Pending bed offer, Continued Medical Work up, English as a second language teacher  Expected Discharge Plan and Services       Living arrangements for the past 2 months: Single Family Home                                       Social Determinants of Health (SDOH) Interventions SDOH Screenings   Food Insecurity: No Food Insecurity (08/15/2023)  Housing: Low Risk  (08/15/2023)  Transportation Needs: No Transportation Needs (08/15/2023)  Utilities: Not At Risk (08/15/2023)  Alcohol Screen: Low Risk  (07/12/2023)  Depression (PHQ2-9): High Risk (07/12/2023)  Financial Resource Strain: Low Risk  (07/12/2023)  Physical Activity: Inactive (07/12/2023)  Social Connections: Moderately Isolated (08/15/2023)  Stress: No Stress Concern Present (07/12/2023)  Tobacco Use: Medium Risk (08/16/2023)  Health Literacy: Adequate Health Literacy (07/12/2023)    Readmission Risk Interventions    12/04/2022   12:13 PM  Readmission Risk Prevention Plan  Post Dischage Appt Complete  Medication Screening Complete  Transportation Screening Complete

## 2023-08-19 NOTE — Plan of Care (Signed)

## 2023-08-19 NOTE — Progress Notes (Signed)
 PROGRESS NOTE    [name]  FMW:[id number] DOB: [birth year] DOA: 08/14/2023 PCP: Alphonsa Glendia LABOR, MD   Brief Narrative:  [name] is a 83 y.o. male with hx of CAD, HFpEF, COPD on 4 L O2, OSA on BiPAP, ulcerative colitis, ankylosing spondylitis, hypertension, hyperlipidemia, CKD 3A, primary  hyperparathyroidism, morbid obesity, mood disorder, dementia, who presented with progressive fatigue, culminating in a ground-level fall.  Assessment & Plan:   Principal Problem:   Complicated UTI (urinary tract infection) Active Problems:   CAP (community acquired pneumonia)   Failure to thrive in adult  Failure to thrive Ambulatory dysfunction, acute on chronic - ground-level fall Rule out overt dysphagia Rule out concurrent thrush -Progressive weakness over the past year with multiple falls, imaging negative at this time - PT OT to follow, appreciate insight recommendations - Appreciate speech evaluation -no clear signs or symptoms of overt speech dysfunction/dysphagia, continue dysphagia 3 diet thin liquids and follow clinically - Continue oral nystatin    Severe sepsis secondary to complicated Proteus urinary tract infection 7 mm distal left ureteral stone with hydroureter - Malodorous urine with notable bacteria on UA and pyuria with leukocytosis, tachycardia, altered mental status. - Continue ceftriaxone  x 7 days, culture resistant only to nitrofurantoin - Cystoscopy with lithotripsy and ureteral stenting 7/11 - tolerated procedure well per urology -appreciate insight recommendations -Plan for stent removal in 1 to 2 weeks with Dr. Watt outpatient - Leukocytosis uptrending mildly after stone destruction, not unexpected given likely nidus for infection now exposed  Chronic hypoxic respiratory failure  Rule out aspiration pneumonitis versus possible underlying community-acquired pneumonia  - Viral panel negative  - Recent sick contacts in the family however no indication  for antivirals given timing and negative respiratory PCR - Treat empirically with ceftriaxone  in setting of above with azithromycin  x 5 days - Patient is without acute hypoxia but has known chronic hypoxia on 4 L nasal cannula with BiPAP at night  CAD: continue home aspirin . Continue home pravastatin .  Continue home isosorbide  twice daily,  HFpEF, without acute exacerbation:  - Most recent echo December 2023: LVEF 55 to 60%, moderate LVH, indeterminate diastolic dysfunction.  - Minimally elevated BNP - Fluids given at intake, will resume home p.o. diuretics in the next 24 to 48 hours pending volume status and renal recovery  COPD: On 4 L O2 at home.  Continue supportive care, nebs  OSA: BiPAP nightly  Ulcerative colitis, ankylosing spondylitis: Not on active therapy  Hypertension: Continue carvedilol  ; hold on his amlodipine , hydralazine , spironolactone   HLD: Continue statin   CKD 3A: Baseline creatinine near 1.7.  Primary hyperparathyroidism: He has mild hypercalcemia at 25 which is similar to previous values.  If this is uptrending would give both hydration and IV Lasix   Morbid obesity: Reports losing weight in the outpatient setting, continue to trend Mood disorder: Continue home duloxetine  History of dementia: Not on any active therapy, unspecified type BPH: continue home Tamsulosin    DVT prophylaxis: SCDs Start: 08/14/23 2154 Code Status:   Code Status: Limited: Do not attempt resuscitation (DNR) -DNR-LIMITED -Do Not Intubate/DNI  Family Communication: Daughter updated at bedside, daughter updated over the phone  Status is: Inpatient  Dispo: The patient is from: Home              Anticipated d/c is to: SNF              Anticipated d/c date is: 24 to 48 hours  Patient currently not medically stable for discharge  Consultants:  Urology  Procedures:  Potential cystoscopy 7/10  Antimicrobials:  Ceftriaxone  azithromycin   Subjective: No acute issues or  events overnight, denies nausea vomiting diarrhea constipation headache fevers chills or chest pain  Objective: Vitals:   08/19/23 0021 08/19/23 0354 08/19/23 0500 08/19/23 0732  BP: (!) 154/56 (!) 155/72  (!) 150/57  Pulse: (!) 57 61  (!) 59  Resp: 19 19  18   Temp: 98.2 F (36.8 C) 98.4 F (36.9 C)  98.5 F (36.9 C)  TempSrc:      SpO2: 94% 95%  95%  Weight:   127.7 kg   Height:        Intake/Output Summary (Last 24 hours) at 08/19/2023 0806 Last data filed at 08/19/2023 0356 Gross per 24 hour  Intake --  Output 2400 ml  Net -2400 ml   Filed Weights   08/16/23 1143 08/18/23 0459 08/19/23 0500  Weight: (P) 127.7 kg 127.7 kg 127.7 kg    Examination:  General:  Pleasantly resting in bed, No acute distress.  Somnolent but easily arousable HEENT:  Normocephalic atraumatic.  Sclerae nonicteric, noninjected.  Extraocular movements intact bilaterally. Neck:  Without mass or deformity.  Trachea is midline. Lungs:  Clear to auscultate bilaterally without rhonchi, wheeze, or rales. Heart:  Regular rate and rhythm.  Without murmurs, rubs, or gallops. Abdomen:  Soft, nontender, obese, nondistended.  Without guarding or rebound. Extremities: Without cyanosis, clubbing, edema, or obvious deformity. Skin:  Warm and dry, no erythema.  Data Reviewed: I have personally reviewed following labs and imaging studies  CBC: Recent Labs  Lab 08/14/23 1641 08/14/23 1653 08/15/23 0455 08/17/23 0319 08/18/23 0524  WBC 15.3*  --  12.8* 15.3* 16.5*  NEUTROABS 9.9*  --   --   --   --   HGB 11.3* 11.9* 11.5* 12.3* 12.0*  HCT 34.7* 35.0* 35.3* 38.9* 37.6*  MCV 91.8  --  91.5 94.0 92.2  PLT 291  --  284 348 378   Basic Metabolic Panel: Recent Labs  Lab 08/14/23 1641 08/14/23 1653 08/15/23 0455 08/17/23 0319 08/18/23 0524  NA 133* 133* 134* 136 137  K 4.2 4.2 4.1 5.0 4.7  CL 101 102 102 102 103  CO2 22  --  25 26 26   GLUCOSE 135* 135* 114* 197* 132*  BUN 31* 32* 35* 43* 48*   CREATININE 1.82* 1.80* 1.59* 1.61* 1.47*  CALCIUM 11.0*  --  11.1* 11.7* 12.5*  MG  --   --  2.1  --   --   PHOS  --   --  3.0  --   --    GFR: Estimated Creatinine Clearance: 52.4 mL/min (A) (by C-G formula based on SCr of 1.47 mg/dL (H)). Liver Function Tests: Recent Labs  Lab 08/14/23 1641  AST 30  ALT 23  ALKPHOS 73  BILITOT 1.0  PROT 7.0  ALBUMIN  2.5*   Thyroid  Function Tests: No results for input(s): TSH, T4TOTAL, FREET4, T3FREE, THYROIDAB in the last 72 hours.  Sepsis Labs: Recent Labs  Lab 08/14/23 1654 08/14/23 1844  LATICACIDVEN 1.0 0.8    Recent Results (from the past 240 hours)  Urine Culture     Status: Abnormal   Collection Time: 08/14/23  6:00 PM   Specimen: Urine, Random  Result Value Ref Range Status   Specimen Description URINE, RANDOM  Final   Special Requests   Final    NONE Reflexed from [id number] Performed at [facility], [address], [place], [place] [address]    Culture >=100,000 COLONIES/mL PROTEUS MIRABILIS (A)  Final   Report Status 08/16/2023 FINAL  Final   Organism ID, Bacteria PROTEUS MIRABILIS (A)  Final      Susceptibility   Proteus mirabilis - MIC*    AMPICILLIN <=2 SENSITIVE Sensitive     CEFAZOLIN  8 SENSITIVE Sensitive     CEFEPIME  <=0.12 SENSITIVE Sensitive     CEFTRIAXONE  <=0.25 SENSITIVE Sensitive     CIPROFLOXACIN  <=0.25 SENSITIVE Sensitive     GENTAMICIN <=1 SENSITIVE Sensitive     IMIPENEM 4 SENSITIVE Sensitive     NITROFURANTOIN 256 RESISTANT Resistant     TRIMETH/SULFA <=20 SENSITIVE Sensitive     AMPICILLIN/SULBACTAM <=2 SENSITIVE Sensitive     PIP/TAZO <=4 SENSITIVE Sensitive ug/mL    * >=100,000 COLONIES/mL PROTEUS MIRABILIS  Respiratory (~20 pathogens) panel by PCR     Status: None   Collection Time: 08/14/23  8:15 PM   Specimen: Urine, Clean Catch; Respiratory  Result Value Ref Range Status   Adenovirus NOT DETECTED NOT DETECTED Final   Coronavirus 229E NOT DETECTED NOT DETECTED Final     Comment: (NOTE) The Coronavirus on the Respiratory Panel, DOES NOT test for the novel  Coronavirus (2019 nCoV)    Coronavirus HKU1 NOT DETECTED NOT DETECTED Final   Coronavirus NL63 NOT DETECTED NOT DETECTED Final   Coronavirus OC43 NOT DETECTED NOT DETECTED Final   Metapneumovirus NOT DETECTED NOT DETECTED Final   Rhinovirus / Enterovirus NOT DETECTED NOT DETECTED Final   Influenza A NOT DETECTED NOT DETECTED Final   Influenza B NOT DETECTED NOT DETECTED Final   Parainfluenza Virus 1 NOT DETECTED NOT DETECTED Final   Parainfluenza Virus 2 NOT DETECTED NOT DETECTED Final   Parainfluenza Virus 3 NOT DETECTED NOT DETECTED Final   Parainfluenza Virus 4 NOT DETECTED NOT DETECTED Final   Respiratory Syncytial Virus NOT DETECTED NOT DETECTED Final   Bordetella pertussis NOT DETECTED NOT DETECTED Final   Bordetella Parapertussis NOT DETECTED NOT DETECTED Final   Chlamydophila pneumoniae NOT DETECTED NOT DETECTED Final   Mycoplasma pneumoniae NOT DETECTED NOT DETECTED Final    Comment: Performed at [facility], [address], [place], [place] [address]  Culture, blood (Routine X 2) w Reflex to ID Panel     Status: None   Collection Time: 08/14/23 10:12 PM   Specimen: BLOOD RIGHT HAND  Result Value Ref Range Status   Specimen Description BLOOD RIGHT HAND  Final   Special Requests   Final    BOTTLES DRAWN AEROBIC AND ANAEROBIC Blood Culture adequate volume   Culture   Final    NO GROWTH 5 DAYS Performed at [facility], [address] [place], [place] [address]    Report Status 08/19/2023 FINAL  Final  Culture, blood (Routine X 2) w Reflex to ID Panel     Status: None   Collection Time: 08/14/23 10:12 PM   Specimen: BLOOD LEFT HAND  Result Value Ref Range Status   Specimen Description BLOOD LEFT HAND  Final   Special Requests   Final    BOTTLES DRAWN AEROBIC AND ANAEROBIC Blood Culture adequate volume   Culture   Final    NO GROWTH 5 DAYS Performed at [facility], [address] [place], [place] [address]    Report Status 08/19/2023 FINAL  Final         Radiology Studies: No results found.  Scheduled Meds:  aspirin  EC  81 mg Oral Daily   carvedilol   12.5 mg Oral BID WC   cefUROXime   500 mg Oral BID WC   DULoxetine   60 mg Oral Daily   feeding supplement  237 mL Oral BID BM   isosorbide  mononitrate  30 mg Oral Daily   nystatin   5 mL Mouth/Throat TID AC & HS   pravastatin   40 mg Oral Daily   sodium chloride  flush  3 mL Intravenous Q12H   tamsulosin   0.4 mg Oral Daily   torsemide   20 mg Oral Daily   Vitamin D  (Ergocalciferol )  50,000 Units Oral Q Sat   Continuous Infusions:     LOS: 5 days   Time spent:  Chris JAYSON Montclair, DO [facility]  If 7PM-7AM, please contact night-coverage [email or web address]  08/19/2023, 8:06 AM

## 2023-08-19 NOTE — Progress Notes (Signed)
 Nutrition Follow-up  DOCUMENTATION CODES:   Obesity unspecified  INTERVENTION:   Continue dysphagia 3 diet as recommended by SLP Assist with feeding pt Room service with assist  Continue Ensure Plus High Protein po BID, each supplement provides 350 kcal and 20 grams of protein. Continue Magic cup TID with meals, each supplement provides 290 kcal and 9 grams of protein MVI with minerals daily   NUTRITION DIAGNOSIS:   Increased nutrient needs related to acute illness as evidenced by estimated needs. - Still applicable   GOAL:   Patient will meet greater than or equal to 90% of their needs - Progressing   MONITOR:   PO intake, Supplement acceptance, Weight trends, Labs  REASON FOR ASSESSMENT:   Consult Assessment of nutrition requirement/status  ASSESSMENT:   Pt admitted with progressive fatigue resulting in a ground level fall. Found to have complicated UTI on admission. PMH significant for CAD, HFpEF, COPD, ulcerative colitis, HTN, HLD, CKD IIIa, primary hyperparathyroidism, dementia.  Pt resting in bed, no family bedside. Lunch tray on bedside table, 0% consumed. Pt unable to provide accurate history, was able to confirm he has not had an appetite and had a couple falls in the last couple of months. RD called daughter for patient's history  She reports that he has had a long gradual decline especially over the past year with diminishing oral intake, weight loss 317 LB -> to 274 LB.  Has dysphagia to solids greater than liquids and eats a bite-size diet.  Has had worsening weakness and requires more help with ADLs.  He has had 2 falls in the past 6 months. At this point daughter does not feel that she can safely care for him at home and is hoping for placement in a nursing facility.    Over the past 2 weeks pt has had a more sharp decline. Reportedly there was a cold going around the family. On exam pt with no muscle or fat loss but exam limited due to edema in extremities.  If weight history is accurate pt has lost 43 lbs, 13.5% in 1 year. This is not clinically significant but is concerning given overall clinical picture.   RN reports pt eats good and did well with breakfast however 0% documented in [facility]. Pt reports he had about 50% of eggs, sausage, and potatoes. No meals documented to review. Pt is a poor historian, unsure how much pt is really consuming. Seems to be drinking 1-2 Ensures per day. If pt continues with poor PO intake may qualify for malnutrition.    Admit weight: 127.7 kg Current weight: 127.7 kg  Non-pitting edema Lower extremity edema   Average Meal Intake: 7/14: 0% intake x 1 recorded meals  Nutritionally Relevant Medications: Scheduled Meds:  aspirin  EC  81 mg Oral Daily   carvedilol   12.5 mg Oral BID WC   cefUROXime   500 mg Oral BID WC   DULoxetine   60 mg Oral Daily   feeding supplement  237 mL Oral BID BM   isosorbide  mononitrate  30 mg Oral Daily   nystatin   5 mL Mouth/Throat TID AC & HS   pravastatin   40 mg Oral Daily   sodium chloride  flush  3 mL Intravenous Q12H   tamsulosin   0.4 mg Oral Daily   torsemide   20 mg Oral Daily   Vitamin D  (Ergocalciferol )  50,000 Units Oral Q Sat    Labs Reviewed: BUN 48 Creatinine 1.47 Calcium 12.5  GFR 47 CBG ranges from 132-197 mg/dL over the  last 24 hours HgbA1c 6.3  NUTRITION - FOCUSED PHYSICAL EXAM:  Flowsheet Row Most Recent Value  Orbital Region No depletion  Upper Arm Region No depletion  Thoracic and Lumbar Region No depletion  Buccal Region No depletion  Temple Region No depletion  Clavicle Bone Region No depletion  Clavicle and Acromion Bone Region No depletion  Scapular Bone Region No depletion  Dorsal Hand No depletion  Patellar Region Unable to assess  [Fluid]  Anterior Thigh Region Unable to assess  [Fluid]  Posterior Calf Region Unable to assess  [Fluid]  Edema (RD Assessment) Moderate  Hair Reviewed  Eyes Reviewed  Mouth Reviewed  Skin Reviewed  Nails  Reviewed    Diet Order:   Diet Order             DIET DYS 3 Room service appropriate? Yes; Fluid consistency: Thin  Diet effective now                   EDUCATION NEEDS:   No education needs have been identified at this time  Skin:  Skin Assessment: Reviewed RN Assessment  Last BM:  7/11 type 6 small  Height:   Ht Readings from Last 1 Encounters:  08/16/23 (P) 6' 1 (1.854 m)    Weight:   Wt Readings from Last 1 Encounters:  08/19/23 127.7 kg    Ideal Body Weight:  83.6 kg  BMI:  Body mass index is 37.14 kg/m (pended).  Estimated Nutritional Needs:   Kcal:  2100-2300  Protein:  110-125g  Fluid:  >/=2L   Olivia Kenning, RD Registered Dietitian  See Amion for more information

## 2023-08-19 NOTE — Progress Notes (Signed)
 Speech Language Pathology Treatment: Dysphagia  Patient Details Name: [name] MRN: [id number] DOB: Jun 20, 1940 Today's Date: 08/19/2023 Time: 8873-8852 SLP Time Calculation (min) (ACUTE ONLY): 21 min  Assessment / Plan / Recommendation Clinical Impression  Pt seen with graham cracker, thin water and part of breakfast with daughter present. Pt is a known Airline pilot and daughter states she is aware and as documented on initial assessment wants pt to continue current texture/thin. She states  I try to have him take small bites and sips but he gets irritated with me. Pt consistently coughing with larger straw sips thin which is reduced with smaller sips. Daughter using her grandson's smaller water bottle with smaller spout and he tends to take smaller sips with this however his cooperation to comply with strategies is reduced. If given straw he will take big sips and states I've been eating this way for 84 years. He masticated cracker, then cheese eggs with mildly prolonged mastication. Daughter giving larger bites and cued for smaller and states he gets upset with smaller bites but did allow less on spoon with subsequent trials. Recommend he continue with Dys 3 texture, thin liquids using smaller water bottle or if regular straw, small sips (daughter states she will take bottle home to wash it and bring it back).  Pt given EMST device and able to use with ease on 5 cm H20 and resistance moved up to 15 which he performed 5 trials. Did not do more as he wanted to continue eating. Daughter reports he has 2 other EMST devices at home but will take this and use it at the nursing home and given encouragement to continue. ST will plan to see minimum of once more for use of strategies and education however there may not much more to offer after that.    HPI HPI: pt is an 83 yo male adm to [facility] with AMS, found to have complex UTI - and found to have kidney stone.  Urology saw pt and plans for potential  procedure - Pt with meal at bedside and daughter reports she fed pt approx one hour ago.  Pt with PMH + for Cardiomegaly with vascular congestion and mild interstitial edema, Suspect small pleural effusions. Swallow eval ordered on admit.  Daughter, [name], reports pt has h/o dysphagia and aspiration and he is s/p endoscopy - stretching. She states she was informed that the pt aspirates - and desires ongoing po diet with precautions.  Prior MBS showed silent aspiration of thin - in 2023 - due to decreased laryngeal closure.  SLP reviewed prior MBS showing structural dysphagia likely due to cevical spine and aspiration.      SLP Plan  Continue with current plan of care          Recommendations  Diet recommendations: Dysphagia 3 (mechanical soft);Thin liquid Liquids provided via: Cup;Straw Medication Administration: Whole meds with puree Supervision: Staff to assist with self feeding;Full supervision/cueing for compensatory strategies Compensations: Slow rate;Small sips/bites Postural Changes and/or Swallow Maneuvers: Seated upright 90 degrees                  Oral care BID   Frequent or constant Supervision/Assistance Dysphagia, oropharyngeal phase (R13.12)     Continue with current plan of care     [name]  08/19/2023, 11:52 AM

## 2023-08-19 NOTE — Progress Notes (Signed)
 Physical Therapy Treatment Patient Details Name: [name] MRN: [id number] DOB: January 10, 1941 Today's Date: 08/19/2023   History of Present Illness Patient is an 83 y/o male admitted 08/14/23 due to progressive weakness, decreased oral intake, and fall at home.  Found to have UTI, with ureteral stone and CAP.  PMH positive for CAD, COPD (4L O2), CHF, OSA on CPAP, ulcerative colitis, ankylosing spondylitis, HTN HLD, CKD 3A, dementia.    PT Comments  Pt progressing towards all goals. Pt with improved ability to maintain EOB sitting and to stand this date. Pt with short shuffled steps requiring modAx2 to step to chair. Pt remains confused but did follow commands well. Continue to recommend inpatient rehab < 3 hrs a day to achieve maximal functional recovery for safe transition back home with daughter. Acute PT to cont to follow.    If plan is discharge home, recommend the following: Two people to help with walking and/or transfers;Supervision due to cognitive status;A lot of help with bathing/dressing/bathroom   Can travel by private vehicle     No  Equipment Recommendations  None recommended by PT    Recommendations for Other Services       Precautions / Restrictions Precautions Precautions: Fall Recall of Precautions/Restrictions: Impaired Restrictions Weight Bearing Restrictions Per Provider Order: No     Mobility  Bed Mobility Overal bed mobility: Needs Assistance Bed Mobility: Rolling, Supine to Sit, Sit to Supine Rolling: Used rails, Mod assist   Supine to sit: Max assist     General bed mobility comments: up to EOB assist for initiating moving legs and to lift trunk, scoot hips with time and cues; maxA for trunk elevation    Transfers Overall transfer level: Needs assistance Equipment used: Rolling walker (2 wheels) Transfers: Sit to/from Stand, Bed to chair/wheelchair/BSC Sit to Stand: From elevated surface, +2 physical assistance, Min assist   Step pivot  transfers: Mod assist, +2 physical assistance       General transfer comment: pt with good initiation, minA to power up, modA x2 to steady and step to chair, short step height/near shuffle    Ambulation/Gait               General Gait Details: able to step to chair   Stairs             Wheelchair Mobility     Tilt Bed    Modified Rankin (Stroke Patients Only)       Balance Overall balance assessment: Needs assistance Sitting-balance support: Feet supported, Bilateral upper extremity supported Sitting balance-Leahy Scale: Fair Sitting balance - Comments: reliant on bilat UEs   Standing balance support: Bilateral upper extremity supported Standing balance-Leahy Scale: Poor Standing balance comment: reliant on UE support                            Communication Communication Communication: Impaired Factors Affecting Communication: Hearing impaired  Cognition Arousal: Alert Behavior During Therapy: WFL for tasks assessed/performed   PT - Cognitive impairments: History of cognitive impairments, No family/caregiver present to determine baseline                       PT - Cognition Comments: slow to respond though follows all commands with multimodal cues, oriented to location, situation (fall) and month (knew his birthday was coming up next month) Following commands: Impaired Following commands impaired: Only follows one step commands consistently, Follows one step commands with increased  time    Cueing Cueing Techniques: Verbal cues, Gestural cues, Tactile cues, Visual cues  Exercises      General Comments General comments (skin integrity, edema, etc.): pt soiled in urine due to purwick not adhering properly      Pertinent Vitals/Pain Pain Assessment Pain Assessment: No/denies pain    Home Living                          Prior Function            PT Goals (current goals can now be found in the care plan section)  Acute Rehab PT Goals Patient Stated Goal: go to rehab PT Goal Formulation: With patient/family Time For Goal Achievement: 08/29/23 Potential to Achieve Goals: Fair Progress towards PT goals: Progressing toward goals    Frequency    Min 2X/week      PT Plan      Co-evaluation              AM-PAC PT 6 Clicks Mobility   Outcome Measure  Help needed turning from your back to your side while in a flat bed without using bedrails?: A Lot Help needed moving from lying on your back to sitting on the side of a flat bed without using bedrails?: A Lot Help needed moving to and from a bed to a chair (including a wheelchair)?: A Lot Help needed standing up from a chair using your arms (e.g., wheelchair or bedside chair)?: A Lot Help needed to walk in hospital room?: Total Help needed climbing 3-5 steps with a railing? : Total 6 Click Score: 10    End of Session Equipment Utilized During Treatment: Gait belt Activity Tolerance: Patient limited by fatigue Patient left: with call bell/phone within reach;with chair alarm set;in chair Nurse Communication: Mobility status PT Visit Diagnosis: Other abnormalities of gait and mobility (R26.89);Muscle weakness (generalized) (M62.81)     Time: 9164-9097 PT Time Calculation (min) (ACUTE ONLY): 27 min  Charges:    $Gait Training: 8-22 mins $Therapeutic Activity: 8-22 mins PT General Charges $$ ACUTE PT VISIT: 1 Visit                     Norene Ames, PT, DPT Acute Rehabilitation Services Secure chat preferred Office #: [phone]    Norene CHRISTELLA Ames 08/19/2023, 2:29 PM

## 2023-08-20 DIAGNOSIS — N390 Urinary tract infection, site not specified: Secondary | ICD-10-CM | POA: Diagnosis not present

## 2023-08-20 MED ORDER — PHENYLEPHRINE IN HARD FAT 0.25 % RE SUPP
1.0000 | Freq: Two times a day (BID) | RECTAL | Status: DC
  Administered 2023-08-20: 1 via RECTAL
  Filled 2023-08-20: qty 1

## 2023-08-20 MED ORDER — CARVEDILOL 6.25 MG PO TABS
6.2500 mg | ORAL_TABLET | Freq: Two times a day (BID) | ORAL | Status: DC
  Administered 2023-08-20 – 2023-08-21 (×2): 6.25 mg via ORAL
  Filled 2023-08-20 (×2): qty 1

## 2023-08-20 MED ORDER — PHENYLEPHRINE IN HARD FAT 0.25 % RE SUPP: 1.0000 | Freq: Two times a day (BID) | RECTAL | Status: DC | PRN

## 2023-08-20 NOTE — Progress Notes (Addendum)
 TRH night cross cover note:  Patient and his family request resumption of his home Preparation H suppositories with 0.25% phenylephrine , and would also prefer to use his home supply of these suppositories.   I have placed order for phenylephrine  0.25% suppository PR bid, and I have also placed a pharmacy consult order to help coordinate use of the patient's home suppositories to fulfill this order.   Update: Per patient's request, I have modified the above suppository order to be on a twice daily PRN basis as opposed to twice daily scheduled  basis.    Eva Pore, DO Hospitalist

## 2023-08-20 NOTE — TOC Progression Note (Addendum)
 Transition of Care [facility]) - Progression Note    Patient Details  Name: [name] MRN: [id number] Date of Birth: [birth year]  Transition of Care [facility]) CM/SW Contact  Cejay Cambre A [place], [place] Phone Number: 08/20/2023, 9:29 AM  Clinical Narrative:     Update 1527 Authorization approved with pt's insurance: Auth ID: [id number] Reference ID: [id number] Approval Dates: 08/20/2023-08/22/2023  CSW confirmed with facility that there is a bed available tomorrow at [facility]. Pt has Bipap, daughter can bring to facility, CSW was able to confirm.   Authorization status pending for [facility].   Auth ID: [id number]   TOC will continue to follow.   Expected Discharge Plan: Skilled Nursing Facility Barriers to Discharge: SNF Pending bed offer, Continued Medical Work up, English as a second language teacher  Expected Discharge Plan and Services       Living arrangements for the past 2 months: Single Family Home                                       Social Determinants of Health (SDOH) Interventions SDOH Screenings   Food Insecurity: No Food Insecurity (08/15/2023)  Housing: Low Risk  (08/15/2023)  Transportation Needs: No Transportation Needs (08/15/2023)  Utilities: Not At Risk (08/15/2023)  Alcohol Screen: Low Risk  (07/12/2023)  Depression (PHQ2-9): High Risk (07/12/2023)  Financial Resource Strain: Low Risk  (07/12/2023)  Physical Activity: Inactive (07/12/2023)  Social Connections: Moderately Isolated (08/15/2023)  Stress: No Stress Concern Present (07/12/2023)  Tobacco Use: Medium Risk (08/16/2023)  Health Literacy: Adequate Health Literacy (07/12/2023)    Readmission Risk Interventions    12/04/2022   12:13 PM  Readmission Risk Prevention Plan  Post Dischage Appt Complete  Medication Screening Complete  Transportation Screening Complete

## 2023-08-20 NOTE — Progress Notes (Signed)
 PROGRESS NOTE    [name]  FMW:[id number] DOB: October 31, 1940 DOA: 08/14/2023 PCP: Alphonsa Glendia LABOR, MD   Brief Narrative:  [name] is a 83 y.o. male with hx of CAD, HFpEF, COPD on 4 L O2, OSA on BiPAP, ulcerative colitis, ankylosing spondylitis, hypertension, hyperlipidemia, CKD 3A, primary  hyperparathyroidism, morbid obesity, mood disorder, dementia, who presented with progressive fatigue, culminating in a ground-level fall.  Assessment & Plan:   Principal Problem:   Complicated UTI (urinary tract infection) Active Problems:   CAP (community acquired pneumonia)   Failure to thrive in adult  Failure to thrive Ambulatory dysfunction, acute on chronic - ground-level fall Rule out overt dysphagia Rule out concurrent thrush -Progressive weakness over the past year with multiple falls, imaging negative at this time - PT OT to follow, appreciate insight recommendations - Appreciate speech evaluation -no clear signs or symptoms of overt speech dysfunction/dysphagia, continue dysphagia 3 diet thin liquids and follow clinically - Continue oral nystatin    Severe sepsis secondary to complicated Proteus urinary tract infection 7 mm distal left ureteral stone with hydroureter - Malodorous urine with notable bacteria on UA and pyuria with leukocytosis, tachycardia, altered mental status. - Continue ceftriaxone  x 7 days, culture resistant only to nitrofurantoin - Cystoscopy with lithotripsy and ureteral stenting 7/11 - tolerated procedure well per urology -appreciate insight recommendations -Plan for stent removal in 1 to 2 weeks with Dr. Watt outpatient - Leukocytosis uptrending mildly after stone destruction, not unexpected given likely nidus for infection now exposed  Chronic hypoxic respiratory failure  Rule out aspiration pneumonitis versus possible underlying community-acquired pneumonia  - Viral panel negative  - Recent sick contacts in the family however no indication  for antivirals given timing and negative respiratory PCR - Treat empirically with ceftriaxone  in setting of above with azithromycin  x 5 days - Patient is without acute hypoxia but has known chronic hypoxia on 4 L nasal cannula with BiPAP at night  CAD: continue home aspirin . Continue home pravastatin .  Continue home isosorbide  twice daily,  HFpEF, without acute exacerbation:  - Most recent echo December 2023: LVEF 55 to 60%, moderate LVH, indeterminate diastolic dysfunction.  - Minimally elevated BNP - Fluids given at intake, will resume home p.o. diuretics in the next 24 to 48 hours pending volume status and renal recovery  COPD: On 4 L O2 at home.  Continue supportive care, nebs  OSA: BiPAP nightly  Ulcerative colitis, ankylosing spondylitis: Not on active therapy  Hypertension: Continue carvedilol  ; hold on his amlodipine , hydralazine , spironolactone   HLD: Continue statin   CKD 3A: Baseline creatinine near 1.7.  Primary hyperparathyroidism: He has mild hypercalcemia at 19 which is similar to previous values.  If this is uptrending would give both hydration and IV Lasix   Morbid obesity: Reports losing weight in the outpatient setting, continue to trend Mood disorder: Continue home duloxetine  History of dementia: Not on any active therapy, unspecified type BPH: continue home Tamsulosin    DVT prophylaxis: SCDs Start: 08/14/23 2154 Code Status:   Code Status: Limited: Do not attempt resuscitation (DNR) -DNR-LIMITED -Do Not Intubate/DNI  Family Communication: Daughter updated at bedside, daughter updated over the phone  Status is: Inpatient  Dispo: The patient is from: Home              Anticipated d/c is to: SNF              Anticipated d/c date is: 24 hours  Patient currently remains medically stable for discharge, awaiting approval for disposition to rehab  Consultants:  Urology  Procedures:  Potential cystoscopy 7/10  Antimicrobials:  Ceftriaxone   azithromycin   Subjective: No acute issues or events overnight, denies nausea vomiting diarrhea constipation headache fevers chills or chest pain  Objective: Vitals:   08/20/23 0417 08/20/23 0500 08/20/23 0700 08/20/23 0755  BP: 135/63  137/64 137/64  Pulse: (!) 55  (!) 59 (!) 59  Resp: 19     Temp: 98.2 F (36.8 C)  98 F (36.7 C)   TempSrc:   Oral   SpO2: 96%  96%   Weight:  128 kg    Height:        Intake/Output Summary (Last 24 hours) at 08/20/2023 0809 Last data filed at 08/19/2023 1655 Gross per 24 hour  Intake 240 ml  Output 1000 ml  Net -760 ml   Filed Weights   08/18/23 0459 08/19/23 0500 08/20/23 0500  Weight: 127.7 kg 127.7 kg 128 kg    Examination:  General:  Pleasantly resting in bed, No acute distress.  Somnolent but easily arousable HEENT:  Normocephalic atraumatic.  Sclerae nonicteric, noninjected.  Extraocular movements intact bilaterally. Neck:  Without mass or deformity.  Trachea is midline. Lungs:  Clear to auscultate bilaterally without rhonchi, wheeze, or rales. Heart:  Regular rate and rhythm.  Without murmurs, rubs, or gallops. Abdomen:  Soft, nontender, obese, nondistended.  Without guarding or rebound. Extremities: Without cyanosis, clubbing, edema, or obvious deformity. Skin:  Warm and dry, no erythema.  Data Reviewed: I have personally reviewed following labs and imaging studies  CBC: Recent Labs  Lab 08/14/23 1641 08/14/23 1653 08/15/23 0455 08/17/23 0319 08/18/23 0524  WBC 15.3*  --  12.8* 15.3* 16.5*  NEUTROABS 9.9*  --   --   --   --   HGB 11.3* 11.9* 11.5* 12.3* 12.0*  HCT 34.7* 35.0* 35.3* 38.9* 37.6*  MCV 91.8  --  91.5 94.0 92.2  PLT 291  --  284 348 378   Basic Metabolic Panel: Recent Labs  Lab 08/14/23 1641 08/14/23 1653 08/15/23 0455 08/17/23 0319 08/18/23 0524  NA 133* 133* 134* 136 137  K 4.2 4.2 4.1 5.0 4.7  CL 101 102 102 102 103  CO2 22  --  25 26 26   GLUCOSE 135* 135* 114* 197* 132*  BUN 31* 32* 35* 43*  48*  CREATININE 1.82* 1.80* 1.59* 1.61* 1.47*  CALCIUM 11.0*  --  11.1* 11.7* 12.5*  MG  --   --  2.1  --   --   PHOS  --   --  3.0  --   --    GFR: Estimated Creatinine Clearance: 52.4 mL/min (A) (by C-G formula based on SCr of 1.47 mg/dL (H)). Liver Function Tests: Recent Labs  Lab 08/14/23 1641  AST 30  ALT 23  ALKPHOS 73  BILITOT 1.0  PROT 7.0  ALBUMIN  2.5*   Thyroid  Function Tests: No results for input(s): TSH, T4TOTAL, FREET4, T3FREE, THYROIDAB in the last 72 hours.  Sepsis Labs: Recent Labs  Lab 08/14/23 1654 08/14/23 1844  LATICACIDVEN 1.0 0.8    Recent Results (from the past 240 hours)  Urine Culture     Status: Abnormal   Collection Time: 08/14/23  6:00 PM   Specimen: Urine, Random  Result Value Ref Range Status   Specimen Description URINE, RANDOM  Final   Special Requests   Final    NONE Reflexed from [id number] Performed at [facility]  [facility], [address], [place], [place] [address]    Culture >=100,000 COLONIES/mL PROTEUS MIRABILIS (A)  Final   Report Status 08/16/2023 FINAL  Final   Organism ID, Bacteria PROTEUS MIRABILIS (A)  Final      Susceptibility   Proteus mirabilis - MIC*    AMPICILLIN <=2 SENSITIVE Sensitive     CEFAZOLIN  8 SENSITIVE Sensitive     CEFEPIME  <=0.12 SENSITIVE Sensitive     CEFTRIAXONE  <=0.25 SENSITIVE Sensitive     CIPROFLOXACIN  <=0.25 SENSITIVE Sensitive     GENTAMICIN <=1 SENSITIVE Sensitive     IMIPENEM 4 SENSITIVE Sensitive     NITROFURANTOIN 256 RESISTANT Resistant     TRIMETH/SULFA <=20 SENSITIVE Sensitive     AMPICILLIN/SULBACTAM <=2 SENSITIVE Sensitive     PIP/TAZO <=4 SENSITIVE Sensitive ug/mL    * >=100,000 COLONIES/mL PROTEUS MIRABILIS  Respiratory (~20 pathogens) panel by PCR     Status: None   Collection Time: 08/14/23  8:15 PM   Specimen: Urine, Clean Catch; Respiratory  Result Value Ref Range Status   Adenovirus NOT DETECTED NOT DETECTED Final   Coronavirus 229E NOT DETECTED NOT DETECTED Final     Comment: (NOTE) The Coronavirus on the Respiratory Panel, DOES NOT test for the novel  Coronavirus (2019 nCoV)    Coronavirus HKU1 NOT DETECTED NOT DETECTED Final   Coronavirus NL63 NOT DETECTED NOT DETECTED Final   Coronavirus OC43 NOT DETECTED NOT DETECTED Final   Metapneumovirus NOT DETECTED NOT DETECTED Final   Rhinovirus / Enterovirus NOT DETECTED NOT DETECTED Final   Influenza A NOT DETECTED NOT DETECTED Final   Influenza B NOT DETECTED NOT DETECTED Final   Parainfluenza Virus 1 NOT DETECTED NOT DETECTED Final   Parainfluenza Virus 2 NOT DETECTED NOT DETECTED Final   Parainfluenza Virus 3 NOT DETECTED NOT DETECTED Final   Parainfluenza Virus 4 NOT DETECTED NOT DETECTED Final   Respiratory Syncytial Virus NOT DETECTED NOT DETECTED Final   Bordetella pertussis NOT DETECTED NOT DETECTED Final   Bordetella Parapertussis NOT DETECTED NOT DETECTED Final   Chlamydophila pneumoniae NOT DETECTED NOT DETECTED Final   Mycoplasma pneumoniae NOT DETECTED NOT DETECTED Final    Comment: Performed at [facility], [address], [place], [place] [address]  Culture, blood (Routine X 2) w Reflex to ID Panel     Status: None   Collection Time: 08/14/23 10:12 PM   Specimen: BLOOD RIGHT HAND  Result Value Ref Range Status   Specimen Description BLOOD RIGHT HAND  Final   Special Requests   Final    BOTTLES DRAWN AEROBIC AND ANAEROBIC Blood Culture adequate volume   Culture   Final    NO GROWTH 5 DAYS Performed at [facility], [address] [place], [place] [address]    Report Status 08/19/2023 FINAL  Final  Culture, blood (Routine X 2) w Reflex to ID Panel     Status: None   Collection Time: 08/14/23 10:12 PM   Specimen: BLOOD LEFT HAND  Result Value Ref Range Status   Specimen Description BLOOD LEFT HAND  Final   Special Requests   Final    BOTTLES DRAWN AEROBIC AND ANAEROBIC Blood Culture adequate volume   Culture   Final    NO GROWTH 5 DAYS Performed at [facility], [address], [place], [place] [address]    Report Status 08/19/2023 FINAL  Final         Radiology Studies: No results found.  Scheduled Meds:  aspirin  EC  81 mg Oral Daily   carvedilol   12.5 mg Oral BID WC   cefUROXime   500 mg Oral BID WC   DULoxetine   60 mg Oral Daily   feeding supplement  237 mL Oral BID BM   isosorbide  mononitrate  30 mg Oral Daily   multivitamin with minerals  1 tablet Oral Daily   nystatin   5 mL Mouth/Throat TID AC & HS   pravastatin   40 mg Oral Daily   sodium chloride  flush  3 mL Intravenous Q12H   tamsulosin   0.4 mg Oral Daily   torsemide   20 mg Oral Daily   Vitamin D  (Ergocalciferol )  50,000 Units Oral Q Sat   Continuous Infusions:     LOS: 6 days   Time spent:  Elsie JAYSON Montclair, DO [facility]  If 7PM-7AM, please contact night-coverage [email or web address]  08/20/2023, 8:09 AM

## 2023-08-20 NOTE — Plan of Care (Signed)

## 2023-08-21 ENCOUNTER — Encounter (HOSPITAL_COMMUNITY): Payer: Self-pay | Admitting: Internal Medicine

## 2023-08-21 ENCOUNTER — Other Ambulatory Visit (HOSPITAL_COMMUNITY): Payer: Self-pay

## 2023-08-21 DIAGNOSIS — R52 Pain, unspecified: Secondary | ICD-10-CM | POA: Diagnosis not present

## 2023-08-21 DIAGNOSIS — I700 Atherosclerosis of aorta: Secondary | ICD-10-CM | POA: Diagnosis not present

## 2023-08-21 DIAGNOSIS — I130 Hypertensive heart and chronic kidney disease with heart failure and stage 1 through stage 4 chronic kidney disease, or unspecified chronic kidney disease: Secondary | ICD-10-CM | POA: Diagnosis not present

## 2023-08-21 DIAGNOSIS — J9611 Chronic respiratory failure with hypoxia: Secondary | ICD-10-CM | POA: Diagnosis not present

## 2023-08-21 DIAGNOSIS — J449 Chronic obstructive pulmonary disease, unspecified: Secondary | ICD-10-CM | POA: Diagnosis not present

## 2023-08-21 DIAGNOSIS — N201 Calculus of ureter: Secondary | ICD-10-CM

## 2023-08-21 DIAGNOSIS — I5032 Chronic diastolic (congestive) heart failure: Secondary | ICD-10-CM

## 2023-08-21 DIAGNOSIS — G4733 Obstructive sleep apnea (adult) (pediatric): Secondary | ICD-10-CM | POA: Diagnosis not present

## 2023-08-21 DIAGNOSIS — N1831 Chronic kidney disease, stage 3a: Secondary | ICD-10-CM | POA: Diagnosis not present

## 2023-08-21 DIAGNOSIS — N390 Urinary tract infection, site not specified: Secondary | ICD-10-CM | POA: Diagnosis not present

## 2023-08-21 DIAGNOSIS — Z79899 Other long term (current) drug therapy: Secondary | ICD-10-CM | POA: Diagnosis not present

## 2023-08-21 DIAGNOSIS — B370 Candidal stomatitis: Secondary | ICD-10-CM | POA: Diagnosis not present

## 2023-08-21 DIAGNOSIS — E785 Hyperlipidemia, unspecified: Secondary | ICD-10-CM | POA: Diagnosis not present

## 2023-08-21 DIAGNOSIS — B964 Proteus (mirabilis) (morganii) as the cause of diseases classified elsewhere: Secondary | ICD-10-CM | POA: Diagnosis not present

## 2023-08-21 DIAGNOSIS — I509 Heart failure, unspecified: Secondary | ICD-10-CM | POA: Diagnosis not present

## 2023-08-21 DIAGNOSIS — K219 Gastro-esophageal reflux disease without esophagitis: Secondary | ICD-10-CM | POA: Diagnosis not present

## 2023-08-21 DIAGNOSIS — Z9981 Dependence on supplemental oxygen: Secondary | ICD-10-CM | POA: Diagnosis not present

## 2023-08-21 DIAGNOSIS — I2510 Atherosclerotic heart disease of native coronary artery without angina pectoris: Secondary | ICD-10-CM | POA: Diagnosis not present

## 2023-08-21 DIAGNOSIS — D72829 Elevated white blood cell count, unspecified: Secondary | ICD-10-CM | POA: Diagnosis not present

## 2023-08-21 DIAGNOSIS — J189 Pneumonia, unspecified organism: Secondary | ICD-10-CM | POA: Diagnosis not present

## 2023-08-21 MED ORDER — CARVEDILOL 6.25 MG PO TABS
6.2500 mg | ORAL_TABLET | Freq: Two times a day (BID) | ORAL | Status: DC
Start: 2023-08-21 — End: 2023-09-20

## 2023-08-21 MED ORDER — CEFUROXIME AXETIL 500 MG PO TABS: 500.0000 mg | ORAL_TABLET | Freq: Two times a day (BID) | ORAL | Status: AC

## 2023-08-21 MED ORDER — PHENYLEPHRINE IN HARD FAT 0.25 % RE SUPP: 1.0000 | Freq: Two times a day (BID) | RECTAL | Status: DC | PRN

## 2023-08-21 MED ORDER — ALBUTEROL SULFATE (2.5 MG/3ML) 0.083% IN NEBU: 2.5000 mg | INHALATION_SOLUTION | RESPIRATORY_TRACT | Status: DC | PRN

## 2023-08-21 MED ORDER — NYSTATIN 100000 UNIT/ML MT SUSP
5.0000 mL | Freq: Three times a day (TID) | OROMUCOSAL | 0 refills | Status: DC
  Filled 2023-08-21: qty 60, 3d supply, fill #0

## 2023-08-21 NOTE — Assessment & Plan Note (Signed)
 08-21-2023 pt will f/u with Dr. Watt with urology in 2 weeks for left JJ stent removal. SNF to call for appointment.

## 2023-08-21 NOTE — Discharge Summary (Addendum)
 [facility] Phys[facility] Discharge Summary   Patient name: [name]  Admit date:     08/14/2023  Discharge date: 08/21/2023  Attending Physician: SEGARS, JONATHAN [[id number]]  Discharge Physician: Camellia Door   PCP: Alphonsa Glendia LABOR, MD  Admitted From: Home  Disposition:  [facility]  SNF  Recommendations for Outpatient Follow-up:  Follow up with PCP in 1-2 weeks F/u with Dr. Watt with urology in 2 weeks. SNF to call and make appointment  Home Health:No Equipment/Devices: Oxygen  4 L/min. Pt chronically on 4 L/min  Discharge Condition:Stable CODE STATUS:DNR/DNI Diet recommendation: Heart Healthy Fluid Restriction: None  Hospital Summary: HPI: [name] is a 83 y.o. male with hx of CAD, HFpEF, COPD on 4 L O2, OSA on CPAP, ulcerative colitis, ankylosing spondylitis, hypertension, hyperlipidemia, CKD 3A, primary  hyperparathyroidism, morbid obesity, mood disorder, dementia, who presented with progressive fatigue, culminating in a ground-level fall.  History is mainly provided by patient's daughter at the bedside.  She reports that he has had a long gradual decline especially over the past year with diminishing oral intake, weight loss 317 LB -> to 274 LB.  Has dysphagia to solids greater than liquids and eats a bite-size diet.  worsening weakness and requiring more help with ADLs.  He has had 2 falls in the past 6 months.  Most recently it was yesterday, no known injury fall from fall, mechanical in nature.  At this point daughter does not feel that she can safely care for him at home and is hoping for placement in a nursing facility.   Over the past 2 weeks he has had a more sharp decline.  Reportedly there is a cold going around the family.  He has associated sinus congestion, cough which is productive, including scant hemoptysis.  Intermittent diarrhea and constipation.  Urine has been malodorous.  No chest pain, shortness of breath, lower extremity edema.  Reportedly  still taking his torsemide  although has not been filled since 1/' 25.  Significant Events: Admitted 08/14/2023 for UTI, ureterolithiasis 08-16-2023 taken to OR for cysto, left JJ stent placement and laser lithotripsy  Admission Labs: BNP 171 CMP 133, K 4.2, CO2 of 22, BUN 31, Scr 1.82, glu 135 WBC 15.3, HgB 11.3, Plt 291  Admission Imaging Studies: CXR Cardiomegaly with vascular congestion and mild interstitial edema. Suspect small pleural effusions CT renal stone study 7 mm stone in the distal left ureter several cm proximal to the left UVJ with mild hydroureter of the upstream mid ureter. There is mild dilatation of left renal pelvis but stable as compared with CT from October and more decompressed appearance of the proximal left ureter between the renal pelvis and dilated ureteral segment. 2. Multiple bilateral kidney stones.3. Gallstones. 4. Bibasilar airspace disease, right greater than left, suspicious for pneumonia and or aspiration. 5. Aortic atherosclerosis. CT head, C-spine.  No CT evidence of acute intracranial abnormality. No acute fracture or traumatic malalignment of the cervical spine. Mucosal thickening in the paranasal sinuses with air-fluid levels in the sphenoid sinuses. Recommend correlation for acute sinusitis. Chronic and degenerative changes as above. Left foot XR No acute bony abnormality.  Left tib/fib XR No acute bony abnormality.   Significant Labs: 08-14-2023 urine Cx shows proteus mirabilis pan-sensitive except for macrobid  Significant Imaging Studies:   Antibiotic Therapy: Anti-infectives (From admission, onward)    Start     Dose/Rate Route Frequency Ordered Stop   08/17/23 1630  cefUROXime  (CEFTIN ) tablet 500 mg  500 mg Oral 2 times daily with meals 08/17/23 1329 08/22/23 0729   08/15/23 2200  cefTRIAXone  (ROCEPHIN ) 1 g in sodium chloride  0.9 % 100 mL IVPB  Status:  Discontinued        1 g 200 mL/hr over 30 Minutes Intravenous Every 24 hours 08/14/23  2335 08/17/23 1329   08/14/23 2345  azithromycin  (ZITHROMAX ) tablet 500 mg        500 mg Oral Daily at bedtime 08/14/23 2334 08/16/23 2118   08/14/23 2145  cefTRIAXone  (ROCEPHIN ) 1 g in sodium chloride  0.9 % 100 mL IVPB        1 g 200 mL/hr over 30 Minutes Intravenous  Once 08/14/23 2138 08/14/23 2342       Procedures: 08-16-2023 cystoscopy with left JJ stent placement, laser lithotripsy  Consultants: urology   Hospital Course by Problem: * Complicated UTI (urinary tract infection) Prior to 08-20-2024. - Malodorous urine with notable bacteria on UA and pyuria with leukocytosis, tachycardia, altered mental status. - Continue ceftriaxone  x 7 days, culture resistant only to nitrofurantoin - Cystoscopy with lithotripsy and ureteral stenting 7/11 - tolerated procedure well per urology -appreciate insight recommendations -Plan for stent removal in 1 to 2 weeks with Dr. Watt outpatient - Leukocytosis uptrending mildly after stone destruction, not unexpected given likely nidus for infection now exposed 08-21-2023 urine cx grew Proteus mirabilis. Pan-sensitive except for macrobid. Pt completed 3 days of IV rocephin  for his UTI. And he was transitioned to complete a total of 5 days of cefuroxime . He has another 2 days of po abx at [facility]  Ureterolithiasis 08-21-2023 pt will f/u with Dr. Watt with urology in 2 weeks for left JJ stent removal. SNF to call for appointment.  CAP (community acquired pneumonia) Prior to 08-21-2023. - Viral panel negative  - Recent sick contacts in the family however no indication for antivirals given timing and negative respiratory PCR - Treat empirically with ceftriaxone  in setting of above with azithromycin  x 5 days - Patient is without acute hypoxia but has known chronic hypoxia on 4 L nasal cannula with BiPAP at night  08-21-2023 pt treated empirically for CAP. He will complete an additional days of ceftin  to complete his UTI treatment.  Failure to thrive in  adult 08-21-2023 needs SNF for rehab  Stage 3a chronic kidney disease (CKD) ([name]) - baseline Scr 1.4-1.8 08-21-2023 chronic. Baseline scr 1.4-1.8. no ACEI/ARB due to CKD.  DNR (do not resuscitate)   Chronic respiratory failure with hypoxia ([name]) - chronically on 4 L/min continuously 08-21-2023 continue 4 L/min O2.  COPD (chronic obstructive pulmonary disease) ([name]) Prior to 08-21-2023 On 4 L O2 at home. Continue supportive care, nebs   08-21-2023 stable. Continue with 4 L/min O2. Prn albuterol  nebs at SNF.  Chronic heart failure with preserved ejection fraction (HFpEF) ([name]) - No ARB/ACEI due to CKD stage 3b Prior to 08-21-2023. - Most recent echo December 2023: LVEF 55 to 60%, moderate LVH, indeterminate diastolic dysfunction.  - Minimally elevated BNP - Fluids given at intake, will resume home p.o. diuretics in the next 24 to 48 hours pending volume status and renal recovery  08-21-2023 pt is euvolemic at discharge. He will continue with demadex  20 mg daily, imdur  30 mg daily, coreg  6.25 mg bid, aldactone  12.5 mg daily. No ARB/ACEI due to CKD stage 3b  Obesity, Class III, BMI 40-49.9 (morbid obesity) Body mass index is 37.23 kg/m (pended).   Essential hypertension Prior to 08-21-2023. Continue carvedilol  ; hold on his amlodipine , hydralazine ,  spironolactone    08-21-2023 at time of discharge, he will be sent to SNF on demadex  20 mg daily, imdur  30 mg daily, coreg  6.25 mg bid, aldactone  12.5 mg daily.  OSA treated with BiPAP 08-21-2023 chronic. Needs bipap and O2 at night while sleeping. Pt may use his own bipap device and his home settings.    Discharge Diagnoses:  Principal Problem:   Complicated UTI (urinary tract infection) Active Problems:   CAP (community acquired pneumonia)   Ureterolithiasis   Essential hypertension   Obesity, Class III, BMI 40-49.9 (morbid obesity)   Chronic heart failure with preserved ejection fraction (HFpEF) ([name]) - No ARB/ACEI due to CKD  stage 3b   COPD (chronic obstructive pulmonary disease) ([name])   Chronic respiratory failure with hypoxia ([name]) - chronically on 4 L/min continuously   DNR (do not resuscitate)   Stage 3a chronic kidney disease (CKD) ([name]) - baseline Scr 1.4-1.8   Failure to thrive in adult   OSA treated with BiPAP   Oral thrush   Discharge Instructions  Discharge Instructions     Bipap   Complete by: As directed    Pt may use home bipap machine with his home setting at SNF. Please connect this to 4 L/min supplemental O2 at night and while sleeping.   Call MD for:  difficulty breathing, headache or visual disturbances   Complete by: As directed    Call MD for:  extreme fatigue   Complete by: As directed    Call MD for:  hives   Complete by: As directed    Call MD for:  persistant dizziness or light-headedness   Complete by: As directed    Call MD for:  persistant nausea and vomiting   Complete by: As directed    Call MD for:  redness, tenderness, or signs of infection (pain, swelling, redness, odor or green/yellow discharge around incision site)   Complete by: As directed    Call MD for:  severe uncontrolled pain   Complete by: As directed    Call MD for:  temperature >100.4   Complete by: As directed    DIET DYS 3   Complete by: As directed    Mechanical soft diet. Finely chopped meats   Fluid consistency: Thin   Discharge instructions   Complete by: As directed    1. Follow up with your primary care provider in 1-2 weeks following discharge from hospital. 2. Continue with supplemental O2 @ 4 L/min continuously   Increase activity slowly   Complete by: As directed    No wound care   Complete by: As directed       Allergies as of 08/21/2023   No Known Allergies      Medication List     STOP taking these medications    amLODipine  5 MG tablet Commonly known as: NORVASC    hydrALAZINE  25 MG tablet Commonly known as: APRESOLINE        TAKE these medications     acetaminophen  500 MG tablet Commonly known as: TYLENOL  Take 2 tablets (1,000 mg total) by mouth every 6 (six) hours as needed for headache (pain).   ammonium lactate  12 % lotion Commonly known as: Amlactin Daily Apply 1 Application topically as needed for dry skin. What changed: when to take this   aspirin  EC 81 MG tablet Take 81 mg by mouth daily. Swallow whole.   carvedilol  6.25 MG tablet Commonly known as: COREG  Take 1 tablet (6.25 mg total) by mouth 2 (two) times daily  with a meal. What changed:  medication strength how much to take   cefUROXime  500 MG tablet Commonly known as: CEFTIN  Take 1 tablet (500 mg total) by mouth 2 (two) times daily with a meal for 2 days.   clobetasol 0.05 % external solution Commonly known as: TEMOVATE Apply 1 Application topically daily as needed (Dermatitis).   DULoxetine  60 MG capsule Commonly known as: CYMBALTA  TAKE 1 CAPSULE BY MOUTH DAILY   isosorbide  mononitrate 30 MG 24 hr tablet Commonly known as: IMDUR  TAKE 2 TABLETS(60 MG) BY MOUTH DAILY   MULTIVITAMIN PO Take 1 tablet by mouth daily.   mupirocin  ointment 2 % Commonly known as: BACTROBAN  Apply thin amount bid prn   nitroGLYCERIN  0.4 MG SL tablet Commonly known as: NITROSTAT  Place 1 tablet (0.4 mg total) under the tongue every 5 (five) minutes as needed for chest pain.   nystatin  100000 UNIT/ML suspension Commonly known as: MYCOSTATIN  Use as directed 5 mLs (500,000 Units total) in the mouth or throat 4 (four) times daily -  before meals and at bedtime.   OVER THE COUNTER MEDICATION Oxygen  4 LPM   phenylephrine  0.25 % suppository Commonly known as: (USE for PREPARATION-H) Place 1 suppository rectally 2 (two) times daily as needed for hemorrhoids.   pravastatin  40 MG tablet Commonly known as: PRAVACHOL  1 qd   spironolactone  25 MG tablet Commonly known as: ALDACTONE  Take 0.5 tablets (12.5 mg total) by mouth daily.   tamsulosin  0.4 MG Caps capsule Commonly  known as: FLOMAX  Take 1 capsule (0.4 mg total) by mouth daily.   torsemide  20 MG tablet Commonly known as: DEMADEX  1 qam and may take 2 pill every day prn if weight gain greater than 3 pounds in a week   Ventolin  HFA 108 (90 Base) MCG/ACT inhaler Generic drug: albuterol  Inhale 1-2 puffs into the lungs every 6 (six) hours as needed for wheezing or shortness of breath. What changed: Another medication with the same name was added. Make sure you understand how and when to take each.   albuterol  (2.5 MG/3ML) 0.083% nebulizer solution Commonly known as: PROVENTIL  Take 3 mLs (2.5 mg total) by nebulization every 4 (four) hours as needed for wheezing or shortness of breath. What changed: You were already taking a medication with the same name, and this prescription was added. Make sure you understand how and when to take each.   Vitamin D  (Ergocalciferol ) 1.25 MG (50000 UNIT) Caps capsule Commonly known as: DRISDOL  TAKE 1 CAPSULE BY MOUTH EVERY 7 DAYS        Contact information for follow-up providers     Watt Rush, MD. Schedule an appointment as soon as possible for a visit in 2 week(s).   Specialty: Urology Why: for JJ stent removal Contact information: [address] [place] [address] [phone]              Contact information for after-discharge care     Destination     [facility] .   Service: Skilled Nursing Contact information: [address] [place]  [phone]                    No Known Allergies  Discharge Exam: Vitals:   08/21/23 0445 08/21/23 0724  BP: (!) 140/57 (!) 136/59  Pulse: 63 65  Resp: 20 19  Temp: 97.8 F (36.6 C) 98 F (36.7 C)  SpO2: 98% 97%    Physical Exam Vitals and nursing note reviewed.  Constitutional:  Appearance: He is obese.  HENT:     Head: Normocephalic and atraumatic.  Eyes:     General: No scleral icterus. Cardiovascular:     Rate and Rhythm: Normal rate and  regular rhythm.  Pulmonary:     Effort: Pulmonary effort is normal. No respiratory distress.     Breath sounds: No wheezing.     Comments: Wearing [place] O2 @ 4-5 L/min Abdominal:     General: Abdomen is protuberant. Bowel sounds are normal. There is no distension.     Palpations: Abdomen is soft.  Skin:    Capillary Refill: Capillary refill takes less than 2 seconds.  Neurological:     Mental Status: He is oriented to person, place, and time.     The results of significant diagnostics from this hospitalization (including imaging, microbiology, ancillary and laboratory) are listed below for reference.    Microbiology: Recent Results (from the past 240 hours)  Urine Culture     Status: Abnormal   Collection Time: 08/14/23  6:00 PM   Specimen: Urine, Random  Result Value Ref Range Status   Specimen Description URINE, RANDOM  Final   Special Requests   Final    NONE Reflexed from [id number] Performed at [facility], [address], [place], [place] [address]    Culture >=100,000 COLONIES/mL PROTEUS MIRABILIS (A)  Final   Report Status 08/16/2023 FINAL  Final   Organism ID, Bacteria PROTEUS MIRABILIS (A)  Final      Susceptibility   Proteus mirabilis - MIC*    AMPICILLIN <=2 SENSITIVE Sensitive     CEFAZOLIN  8 SENSITIVE Sensitive     CEFEPIME  <=0.12 SENSITIVE Sensitive     CEFTRIAXONE  <=0.25 SENSITIVE Sensitive     CIPROFLOXACIN  <=0.25 SENSITIVE Sensitive     GENTAMICIN <=1 SENSITIVE Sensitive     IMIPENEM 4 SENSITIVE Sensitive     NITROFURANTOIN 256 RESISTANT Resistant     TRIMETH/SULFA <=20 SENSITIVE Sensitive     AMPICILLIN/SULBACTAM <=2 SENSITIVE Sensitive     PIP/TAZO <=4 SENSITIVE Sensitive ug/mL    * >=100,000 COLONIES/mL PROTEUS MIRABILIS  Respiratory (~20 pathogens) panel by PCR     Status: None   Collection Time: 08/14/23  8:15 PM   Specimen: Urine, Clean Catch; Respiratory  Result Value Ref Range Status   Adenovirus NOT DETECTED NOT DETECTED Final   Coronavirus  229E NOT DETECTED NOT DETECTED Final    Comment: (NOTE) The Coronavirus on the Respiratory Panel, DOES NOT test for the novel  Coronavirus (2019 nCoV)    Coronavirus HKU1 NOT DETECTED NOT DETECTED Final   Coronavirus NL63 NOT DETECTED NOT DETECTED Final   Coronavirus OC43 NOT DETECTED NOT DETECTED Final   Metapneumovirus NOT DETECTED NOT DETECTED Final   Rhinovirus / Enterovirus NOT DETECTED NOT DETECTED Final   Influenza A NOT DETECTED NOT DETECTED Final   Influenza B NOT DETECTED NOT DETECTED Final   Parainfluenza Virus 1 NOT DETECTED NOT DETECTED Final   Parainfluenza Virus 2 NOT DETECTED NOT DETECTED Final   Parainfluenza Virus 3 NOT DETECTED NOT DETECTED Final   Parainfluenza Virus 4 NOT DETECTED NOT DETECTED Final   Respiratory Syncytial Virus NOT DETECTED NOT DETECTED Final   Bordetella pertussis NOT DETECTED NOT DETECTED Final   Bordetella Parapertussis NOT DETECTED NOT DETECTED Final   Chlamydophila pneumoniae NOT DETECTED NOT DETECTED Final   Mycoplasma pneumoniae NOT DETECTED NOT DETECTED Final    Comment: Performed at [facility], [address], [place], [place] [address]  Culture, blood (Routine X 2) w Reflex to ID Panel     Status: None   Collection Time: 08/14/23 10:12 PM   Specimen: BLOOD RIGHT HAND  Result Value Ref Range Status   Specimen Description BLOOD RIGHT HAND  Final   Special Requests   Final    BOTTLES DRAWN AEROBIC AND ANAEROBIC Blood Culture adequate volume   Culture   Final    NO GROWTH 5 DAYS Performed at [facility], [address], [place], [place] [address]    Report Status 08/19/2023 FINAL  Final  Culture, blood (Routine X 2) w Reflex to ID Panel     Status: None   Collection Time: 08/14/23 10:12 PM   Specimen: BLOOD LEFT HAND  Result Value Ref Range Status   Specimen Description BLOOD LEFT HAND  Final   Special Requests   Final    BOTTLES DRAWN AEROBIC AND ANAEROBIC Blood Culture adequate volume   Culture   Final    NO  GROWTH 5 DAYS Performed at [facility], [address], [place], [place] [address]    Report Status 08/19/2023 FINAL  Final     Labs: BNP (last 3 results) Recent Labs    12/03/22 1042 12/06/22 0828 08/14/23 1641  BNP 125.7* 89.1 171.5*   Basic Metabolic Panel: Recent Labs  Lab 08/14/23 1641 08/14/23 1653 08/15/23 0455 08/17/23 0319 08/18/23 0524  NA 133* 133* 134* 136 137  K 4.2 4.2 4.1 5.0 4.7  CL 101 102 102 102 103  CO2 22  --  25 26 26   GLUCOSE 135* 135* 114* 197* 132*  BUN 31* 32* 35* 43* 48*  CREATININE 1.82* 1.80* 1.59* 1.61* 1.47*  CALCIUM 11.0*  --  11.1* 11.7* 12.5*  MG  --   --  2.1  --   --   PHOS  --   --  3.0  --   --    Liver Function Tests: Recent Labs  Lab 08/14/23 1641  AST 30  ALT 23  ALKPHOS 73  BILITOT 1.0  PROT 7.0  ALBUMIN  2.5*   CBC: Recent Labs  Lab 08/14/23 1641 08/14/23 1653 08/15/23 0455 08/17/23 0319 08/18/23 0524  WBC 15.3*  --  12.8* 15.3* 16.5*  NEUTROABS 9.9*  --   --   --   --   HGB 11.3* 11.9* 11.5* 12.3* 12.0*  HCT 34.7* 35.0* 35.3* 38.9* 37.6*  MCV 91.8  --  91.5 94.0 92.2  PLT 291  --  284 348 378   BNP: Recent Labs  Lab 08/14/23 1641  BNP 171.5*   CBG: Recent Labs  Lab 08/16/23 2218  GLUCAP 212*   Urinalysis    Component Value Date/Time   COLORURINE YELLOW 08/14/2023 1800   APPEARANCEUR HAZY (A) 08/14/2023 1800   LABSPEC 1.010 08/14/2023 1800   PHURINE 6.0 08/14/2023 1800   GLUCOSEU NEGATIVE 08/14/2023 1800   HGBUR SMALL (A) 08/14/2023 1800   BILIRUBINUR NEGATIVE 08/14/2023 1800   BILIRUBINUR ++ 11/18/2012 1319   KETONESUR NEGATIVE 08/14/2023 1800   PROTEINUR 30 (A) 08/14/2023 1800   UROBILINOGEN 0.2 08/04/2014 1620   NITRITE NEGATIVE 08/14/2023 1800   LEUKOCYTESUR LARGE (A) 08/14/2023 1800   Sepsis Labs Recent Labs  Lab 08/14/23 1641 08/15/23 0455 08/17/23 0319 08/18/23 0524  WBC 15.3* 12.8* 15.3* 16.5*    Procedures/Studies: DG C-Arm 1-60 Min-No Report Result Date:  08/16/2023 Fluoroscopy was utilized by the requesting physician.  No radiographic interpretation.   CT Head Wo  Contrast Result Date: 08/14/2023 CLINICAL DATA:  Head trauma, neck trauma, mental status change of unknown cause. EXAM: CT HEAD WITHOUT CONTRAST CT CERVICAL SPINE WITHOUT CONTRAST TECHNIQUE: Multidetector CT imaging of the head and cervical spine was performed following the standard protocol without intravenous contrast. Multiplanar CT image reconstructions of the cervical spine were also generated. RADIATION DOSE REDUCTION: This exam was performed according to the departmental dose-optimization program which includes automated exposure control, adjustment of the mA and/or kV according to patient size and/or use of iterative reconstruction technique. COMPARISON:  10/28/2020. FINDINGS: CT HEAD FINDINGS Brain: No acute intracranial hemorrhage. No CT evidence of acute infarct. Remote lacunar infarcts in the bilateral basal ganglia. Nonspecific hypoattenuation in the periventricular and subcortical white matter favored to reflect chronic microvascular ischemic changes. No edema, mass effect, or midline shift. The basilar cisterns are patent. Ventricles: The ventricles are normal. Vascular: No hyperdense vessel or unexpected calcification. Skull: No acute or aggressive finding. Orbits: Orbits are symmetric. Sinuses: Mucosal thickening throughout the paranasal sinuses most pronounced in the sphenoid sinuses with possible small air-fluid levels. Other: Small left mastoid effusion. CT CERVICAL SPINE FINDINGS Alignment: Straightening of the normal cervical lordosis. No significant listhesis. No facet subluxation or dislocation. Skull base and vertebrae: Anterior cervical fusion hardware at C4-5. Similar chronic fusion extending from C2 to the upper thoracic spine. Diffuse osteopenia. No evidence of compression fracture or displaced fracture in the cervical spine. No destructive osseous lesion. Soft tissues and  spinal canal: No prevertebral fluid or swelling. No visible canal hematoma. Disc levels: Osseous fusion across multiple disc levels throughout the cervical spine. Disc osteophyte complexes at multiple levels. No high-grade osseous spinal canal stenosis. Facet arthrosis throughout the cervical spine. Prominent degenerative changes at the atlanto dens articulation. Asymmetric degenerative changes at the right atlantoaxial articulation. Upper chest: Paraseptal and centrilobular emphysema. Other: None. IMPRESSION: No CT evidence of acute intracranial abnormality. No acute fracture or traumatic malalignment of the cervical spine. Mucosal thickening in the paranasal sinuses with air-fluid levels in the sphenoid sinuses. Recommend correlation for acute sinusitis. Chronic and degenerative changes as above. Electronically Signed   By: Donnice Mania M.D.   On: 08/14/2023 21:09   CT Cervical Spine Wo Contrast Result Date: 08/14/2023 CLINICAL DATA:  Head trauma, neck trauma, mental status change of unknown cause. EXAM: CT HEAD WITHOUT CONTRAST CT CERVICAL SPINE WITHOUT CONTRAST TECHNIQUE: Multidetector CT imaging of the head and cervical spine was performed following the standard protocol without intravenous contrast. Multiplanar CT image reconstructions of the cervical spine were also generated. RADIATION DOSE REDUCTION: This exam was performed according to the departmental dose-optimization program which includes automated exposure control, adjustment of the mA and/or kV according to patient size and/or use of iterative reconstruction technique. COMPARISON:  10/28/2020. FINDINGS: CT HEAD FINDINGS Brain: No acute intracranial hemorrhage. No CT evidence of acute infarct. Remote lacunar infarcts in the bilateral basal ganglia. Nonspecific hypoattenuation in the periventricular and subcortical white matter favored to reflect chronic microvascular ischemic changes. No edema, mass effect, or midline shift. The basilar cisterns  are patent. Ventricles: The ventricles are normal. Vascular: No hyperdense vessel or unexpected calcification. Skull: No acute or aggressive finding. Orbits: Orbits are symmetric. Sinuses: Mucosal thickening throughout the paranasal sinuses most pronounced in the sphenoid sinuses with possible small air-fluid levels. Other: Small left mastoid effusion. CT CERVICAL SPINE FINDINGS Alignment: Straightening of the normal cervical lordosis. No significant listhesis. No facet subluxation or dislocation. Skull base and vertebrae: Anterior cervical fusion hardware at C4-5. Similar  chronic fusion extending from C2 to the upper thoracic spine. Diffuse osteopenia. No evidence of compression fracture or displaced fracture in the cervical spine. No destructive osseous lesion. Soft tissues and spinal canal: No prevertebral fluid or swelling. No visible canal hematoma. Disc levels: Osseous fusion across multiple disc levels throughout the cervical spine. Disc osteophyte complexes at multiple levels. No high-grade osseous spinal canal stenosis. Facet arthrosis throughout the cervical spine. Prominent degenerative changes at the atlanto dens articulation. Asymmetric degenerative changes at the right atlantoaxial articulation. Upper chest: Paraseptal and centrilobular emphysema. Other: None. IMPRESSION: No CT evidence of acute intracranial abnormality. No acute fracture or traumatic malalignment of the cervical spine. Mucosal thickening in the paranasal sinuses with air-fluid levels in the sphenoid sinuses. Recommend correlation for acute sinusitis. Chronic and degenerative changes as above. Electronically Signed   By: Donnice Mania M.D.   On: 08/14/2023 21:09   DG Tibia/Fibula Left Result Date: 08/14/2023 CLINICAL DATA:  Fall, leg pain EXAM: LEFT TIBIA AND FIBULA - 2 VIEW COMPARISON:  Knee series 10/28/2020 FINDINGS: Prior left knee replacement No acute bony abnormality. Specifically, no fracture, subluxation, or dislocation. Soft  tissues are intact. IMPRESSION: No acute bony abnormality. Electronically Signed   By: Franky Crease M.D.   On: 08/14/2023 21:04   DG Foot 2 Views Left Result Date: 08/14/2023 CLINICAL DATA:  Fall, leg pain EXAM: LEFT FOOT - 2 VIEW COMPARISON:  None Available. FINDINGS: Plantar and posterior calcaneal spurs. No acute bony abnormality. Specifically, no fracture, subluxation, or dislocation. Joint space narrowing in the IP joints and 1st MTP joint. Soft tissues are intact. IMPRESSION: No acute bony abnormality. Electronically Signed   By: Franky Crease M.D.   On: 08/14/2023 21:03   CT Renal Stone Study Result Date: 08/14/2023 CLINICAL DATA:  Flank pain weakness EXAM: CT ABDOMEN AND PELVIS WITHOUT CONTRAST TECHNIQUE: Multidetector CT imaging of the abdomen and pelvis was performed following the standard protocol without IV contrast. RADIATION DOSE REDUCTION: This exam was performed according to the departmental dose-optimization program which includes automated exposure control, adjustment of the mA and/or kV according to patient size and/or use of iterative reconstruction technique. COMPARISON:  CT 12/03/2022 FINDINGS: Lower chest: Lung bases demonstrate patchy airspace disease at the left base. Dense right lower lobe airspace disease. Cardiomegaly with coronary vascular calcification Hepatobiliary: Gallstones. No focal hepatic abnormality or biliary dilatation Pancreas: Unremarkable. No pancreatic ductal dilatation or surrounding inflammatory changes. Spleen: Normal in size without focal abnormality. Adrenals/Urinary Tract: Adrenal glands are normal. Stable for left renal pelvis dilatation. There is dilatation of the mid left ureter with evidence for a 7 mm stone in the distal left ureter several cm proximal to the left UVJ. Multiple punctate right kidney stones. Multiple left-sided kidney stones, these measure up to 18 mm at the midpole. Bladder is unremarkable Stomach/Bowel: Stomach nonenlarged. No dilated small  bowel. No acute bowel wall thickening. Negative appendix. Vascular/Lymphatic: Aortic atherosclerosis. No enlarged abdominal or pelvic lymph nodes. Reproductive: Prostate is unremarkable. Other: Negative for pelvic effusion or free air Musculoskeletal: No acute osseous abnormality. Diffuse ankylosis of the spine and SI joints. IMPRESSION: 1. 7 mm stone in the distal left ureter several cm proximal to the left UVJ with mild hydroureter of the upstream mid ureter. There is mild dilatation of left renal pelvis but stable as compared with CT from October and more decompressed appearance of the proximal left ureter between the renal pelvis and dilated ureteral segment. 2. Multiple bilateral kidney stones. 3. Gallstones. 4. Bibasilar airspace  disease, right greater than left, suspicious for pneumonia and or aspiration. 5. Aortic atherosclerosis. Aortic Atherosclerosis (ICD10-I70.0). Electronically Signed   By: Luke Bun M.D.   On: 08/14/2023 20:29   DG Chest 2 View Result Date: 08/14/2023 CLINICAL DATA:  Shortness of breath EXAM: CHEST - 2 VIEW COMPARISON:  12/04/2022, 01/22/2022 FINDINGS: Lateral views are limited by patient's arms. Cardiomegaly with vascular congestion and mild diffuse interstitial opacity suggestive of mild edema. Suspect also small pleural effusions. IMPRESSION: Cardiomegaly with vascular congestion and mild interstitial edema. Suspect small pleural effusions. Electronically Signed   By: Luke Bun M.D.   On: 08/14/2023 18:06    Time coordinating discharge: 50 mins  SIGNED:  Camellia Door, DO [facility] 08/21/23, 10:11 AM

## 2023-08-21 NOTE — Assessment & Plan Note (Signed)
 Body mass index is 37.23 kg/m (pended).

## 2023-08-21 NOTE — Assessment & Plan Note (Signed)
 Prior to 08-21-2023. Continue carvedilol  ; hold on his amlodipine , hydralazine , spironolactone    08-21-2023 at time of discharge, he will be sent to SNF on demadex  20 mg daily, imdur  30 mg daily, coreg  6.25 mg bid, aldactone  12.5 mg daily.

## 2023-08-21 NOTE — Assessment & Plan Note (Signed)
 Prior to 08-21-2023. - Most recent echo December 2023: LVEF 55 to 60%, moderate LVH, indeterminate diastolic dysfunction.  - Minimally elevated BNP - Fluids given at intake, will resume home p.o. diuretics in the next 24 to 48 hours pending volume status and renal recovery  08-21-2023 pt is euvolemic at discharge. He will continue with demadex  20 mg daily, imdur  30 mg daily, coreg  6.25 mg bid, aldactone  12.5 mg daily. No ARB/ACEI due to CKD stage 3b

## 2023-08-21 NOTE — TOC Transition Note (Signed)
 Transition of Care [facility]) - Discharge Note   Patient Details  Name: [name] MRN: [id number] Date of Birth: 1940-12-01  [facility] CM/SW Contact:  Somtochukwu Woollard A [place], LCSW Phone Number: 08/21/2023, 11:04 AM   Clinical Narrative:     Patient will DC to: [facility]  Anticipated DC date: 08/21/23  Family notified: [name]  Transport by: [facility]      Per MD patient ready for DC to  [facility]. RN, patient, patient's family, and facility notified of DC. Discharge Summary and FL2 sent to facility. RN to call report prior to discharge [phone]). DC packet on chart. Ambulance transport requested for patient.     CSW will sign off for now as social work intervention is no longer needed. Please consult us  again if new needs arise.   Final next level of care: Skilled Nursing Facility Barriers to Discharge: Barriers Resolved   Patient Goals and CMS Choice Patient states their goals for this hospitalization and ongoing recovery are:: SNF          Discharge Placement              Patient chooses bed at: [facility] Patient to be transferred to facility by: [facility] Name of family member notified: [name] Patient and family notified of of transfer: 08/21/23  Discharge Plan and Services Additional resources added to the After Visit Summary for                                       Social Drivers of Health (SDOH) Interventions SDOH Screenings   Food Insecurity: No Food Insecurity (08/15/2023)  Housing: Low Risk  (08/15/2023)  Transportation Needs: No Transportation Needs (08/15/2023)  Utilities: Not At Risk (08/15/2023)  Alcohol Screen: Low Risk  (07/12/2023)  Depression (PHQ2-9): High Risk (07/12/2023)  Financial Resource Strain: Low Risk  (07/12/2023)  Physical Activity: Inactive (07/12/2023)  Social Connections: Moderately Isolated (08/15/2023)  Stress: No Stress Concern Present (07/12/2023)  Tobacco Use: Medium Risk (08/16/2023)   Health Literacy: Adequate Health Literacy (07/12/2023)     Readmission Risk Interventions    12/04/2022   12:13 PM  Readmission Risk Prevention Plan  Post Dischage Appt Complete  Medication Screening Complete  Transportation Screening Complete

## 2023-08-21 NOTE — Hospital Course (Addendum)
 HPI: [name] is a 83 y.o. male with hx of CAD, HFpEF, COPD on 4 L O2, OSA on CPAP, ulcerative colitis, ankylosing spondylitis, hypertension, hyperlipidemia, CKD 3A, primary  hyperparathyroidism, morbid obesity, mood disorder, dementia, who presented with progressive fatigue, culminating in a ground-level fall.  History is mainly provided by patient's daughter at the bedside.  She reports that he has had a long gradual decline especially over the past year with diminishing oral intake, weight loss 317 LB -> to 274 LB.  Has dysphagia to solids greater than liquids and eats a bite-size diet.  worsening weakness and requiring more help with ADLs.  He has had 2 falls in the past 6 months.  Most recently it was yesterday, no known injury fall from fall, mechanical in nature.  At this point daughter does not feel that she can safely care for him at home and is hoping for placement in a nursing facility.   Over the past 2 weeks he has had a more sharp decline.  Reportedly there is a cold going around the family.  He has associated sinus congestion, cough which is productive, including scant hemoptysis.  Intermittent diarrhea and constipation.  Urine has been malodorous.  No chest pain, shortness of breath, lower extremity edema.  Reportedly still taking his torsemide  although has not been filled since 1/' 25.  Significant Events: Admitted 08/14/2023 for UTI, ureterolithiasis 08-16-2023 taken to OR for cysto, left JJ stent placement and laser lithotripsy  Admission Labs: BNP 171 CMP 133, K 4.2, CO2 of 22, BUN 31, Scr 1.82, glu 135 WBC 15.3, HgB 11.3, Plt 291  Admission Imaging Studies: CXR Cardiomegaly with vascular congestion and mild interstitial edema. Suspect small pleural effusions CT renal stone study 7 mm stone in the distal left ureter several cm proximal to the left UVJ with mild hydroureter of the upstream mid ureter. There is mild dilatation of left renal pelvis but stable as compared with  CT from October and more decompressed appearance of the proximal left ureter between the renal pelvis and dilated ureteral segment. 2. Multiple bilateral kidney stones.3. Gallstones. 4. Bibasilar airspace disease, right greater than left, suspicious for pneumonia and or aspiration. 5. Aortic atherosclerosis. CT head, C-spine.  No CT evidence of acute intracranial abnormality. No acute fracture or traumatic malalignment of the cervical spine. Mucosal thickening in the paranasal sinuses with air-fluid levels in the sphenoid sinuses. Recommend correlation for acute sinusitis. Chronic and degenerative changes as above. Left foot XR No acute bony abnormality.  Left tib/fib XR No acute bony abnormality.   Significant Labs: 08-14-2023 urine Cx shows proteus mirabilis pan-sensitive except for macrobid  Significant Imaging Studies:   Antibiotic Therapy: Anti-infectives (From admission, onward)    Start     Dose/Rate Route Frequency Ordered Stop   08/17/23 1630  cefUROXime  (CEFTIN ) tablet 500 mg        500 mg Oral 2 times daily with meals 08/17/23 1329 08/22/23 0729   08/15/23 2200  cefTRIAXone  (ROCEPHIN ) 1 g in sodium chloride  0.9 % 100 mL IVPB  Status:  Discontinued        1 g 200 mL/hr over 30 Minutes Intravenous Every 24 hours 08/14/23 2335 08/17/23 1329   08/14/23 2345  azithromycin  (ZITHROMAX ) tablet 500 mg        500 mg Oral Daily at bedtime 08/14/23 2334 08/16/23 2118   08/14/23 2145  cefTRIAXone  (ROCEPHIN ) 1 g in sodium chloride  0.9 % 100 mL IVPB  1 g 200 mL/hr over 30 Minutes Intravenous  Once 08/14/23 2138 08/14/23 2342       Procedures: 08-16-2023 cystoscopy with left JJ stent placement, laser lithotripsy  Consultants: urology

## 2023-08-21 NOTE — Assessment & Plan Note (Signed)
 08-21-2023 continue 4 L/min O2.

## 2023-08-21 NOTE — Assessment & Plan Note (Addendum)
 08-21-2023 chronic. Needs bipap and O2 at night while sleeping. Pt may use his own bipap device and his home settings.

## 2023-08-21 NOTE — Assessment & Plan Note (Addendum)
 Prior to 08-21-2023 On 4 L O2 at home. Continue supportive care, nebs   08-21-2023 stable. Continue with 4 L/min O2. Prn albuterol  nebs at SNF.

## 2023-08-21 NOTE — Progress Notes (Signed)
 PROGRESS NOTE    [name]  FMW:[id number] DOB: 26-Aug-1940 DOA: 08/14/2023 PCP: Alphonsa Glendia LABOR, MD  Subjective: Pt seen and examined. SNF bed and ins authorization obtained. Stable for DC.   Hospital Course: HPI: [name] is a 83 y.o. male with hx of CAD, HFpEF, COPD on 4 L O2, OSA on CPAP, ulcerative colitis, ankylosing spondylitis, hypertension, hyperlipidemia, CKD 3A, primary  hyperparathyroidism, morbid obesity, mood disorder, dementia, who presented with progressive fatigue, culminating in a ground-level fall.  History is mainly provided by patient's daughter at the bedside.  She reports that he has had a long gradual decline especially over the past year with diminishing oral intake, weight loss 317 LB -> to 274 LB.  Has dysphagia to solids greater than liquids and eats a bite-size diet.  worsening weakness and requiring more help with ADLs.  He has had 2 falls in the past 6 months.  Most recently it was yesterday, no known injury fall from fall, mechanical in nature.  At this point daughter does not feel that she can safely care for him at home and is hoping for placement in a nursing facility.   Over the past 2 weeks he has had a more sharp decline.  Reportedly there is a cold going around the family.  He has associated sinus congestion, cough which is productive, including scant hemoptysis.  Intermittent diarrhea and constipation.  Urine has been malodorous.  No chest pain, shortness of breath, lower extremity edema.  Reportedly still taking his torsemide  although has not been filled since 1/' 25.  Significant Events: Admitted 08/14/2023 for UTI, ureterolithiasis 08-16-2023 taken to OR for cysto, left JJ stent placement and laser lithotripsy  Admission Labs: BNP 171 CMP 133, K 4.2, CO2 of 22, BUN 31, Scr 1.82, glu 135 WBC 15.3, HgB 11.3, Plt 291  Admission Imaging Studies: CXR Cardiomegaly with vascular congestion and mild interstitial edema. Suspect small pleural  effusions CT renal stone study 7 mm stone in the distal left ureter several cm proximal to the left UVJ with mild hydroureter of the upstream mid ureter. There is mild dilatation of left renal pelvis but stable as compared with CT from October and more decompressed appearance of the proximal left ureter between the renal pelvis and dilated ureteral segment. 2. Multiple bilateral kidney stones.3. Gallstones. 4. Bibasilar airspace disease, right greater than left, suspicious for pneumonia and or aspiration. 5. Aortic atherosclerosis. CT head, C-spine.  No CT evidence of acute intracranial abnormality. No acute fracture or traumatic malalignment of the cervical spine. Mucosal thickening in the paranasal sinuses with air-fluid levels in the sphenoid sinuses. Recommend correlation for acute sinusitis. Chronic and degenerative changes as above. Left foot XR No acute bony abnormality.  Left tib/fib XR No acute bony abnormality.   Significant Labs: 08-14-2023 urine Cx shows proteus mirabilis pan-sensitive except for macrobid  Significant Imaging Studies:   Antibiotic Therapy: Anti-infectives (From admission, onward)    Start     Dose/Rate Route Frequency Ordered Stop   08/17/23 1630  cefUROXime  (CEFTIN ) tablet 500 mg        500 mg Oral 2 times daily with meals 08/17/23 1329 08/22/23 0729   08/15/23 2200  cefTRIAXone  (ROCEPHIN ) 1 g in sodium chloride  0.9 % 100 mL IVPB  Status:  Discontinued        1 g 200 mL/hr over 30 Minutes Intravenous Every 24 hours 08/14/23 2335 08/17/23 1329   08/14/23 2345  azithromycin  (ZITHROMAX ) tablet 500 mg  500 mg Oral Daily at bedtime 08/14/23 2334 08/16/23 2118   08/14/23 2145  cefTRIAXone  (ROCEPHIN ) 1 g in sodium chloride  0.9 % 100 mL IVPB        1 g 200 mL/hr over 30 Minutes Intravenous  Once 08/14/23 2138 08/14/23 2342       Procedures: 08-16-2023 cystoscopy with left JJ stent placement, laser lithotripsy  Consultants: urology    Assessment and  Plan: * Complicated UTI (urinary tract infection) Prior to 08-20-2024. - Malodorous urine with notable bacteria on UA and pyuria with leukocytosis, tachycardia, altered mental status. - Continue ceftriaxone  x 7 days, culture resistant only to nitrofurantoin - Cystoscopy with lithotripsy and ureteral stenting 7/11 - tolerated procedure well per urology -appreciate insight recommendations -Plan for stent removal in 1 to 2 weeks with Dr. Watt outpatient - Leukocytosis uptrending mildly after stone destruction, not unexpected given likely nidus for infection now exposed 08-21-2023 urine cx grew Proteus mirabilis. Pan-sensitive except for macrobid. Pt completed 3 days of IV rocephin  for his UTI. And he was transitioned to complete a total of 5 days of cefuroxime . He has another 2 days of po abx at [facility]  Ureterolithiasis 08-21-2023 pt will f/u with Dr. Watt with urology in 2 weeks for left JJ stent removal. SNF to call for appointment.  CAP (community acquired pneumonia) Prior to 08-21-2023. - Viral panel negative  - Recent sick contacts in the family however no indication for antivirals given timing and negative respiratory PCR - Treat empirically with ceftriaxone  in setting of above with azithromycin  x 5 days - Patient is without acute hypoxia but has known chronic hypoxia on 4 L nasal cannula with BiPAP at night  08-21-2023 pt treated empirically for CAP. He will complete an additional days of ceftin  to complete his UTI treatment.  Failure to thrive in adult 08-21-2023 needs SNF for rehab  Stage 3a chronic kidney disease (CKD) ([name]) - baseline Scr 1.4-1.8 08-21-2023 chronic. Baseline scr 1.4-1.8. no ACEI/ARB due to CKD.  DNR (do not resuscitate)   Chronic respiratory failure with hypoxia ([name]) - chronically on 4 L/min continuously 08-21-2023 continue 4 L/min O2.  COPD (chronic obstructive pulmonary disease) ([name]) Prior to 08-21-2023 On 4 L O2 at home. Continue supportive care, nebs    08-21-2023 stable. Continue with 4 L/min O2. Prn albuterol  nebs at SNF.  Chronic heart failure with preserved ejection fraction (HFpEF) ([name]) - No ARB/ACEI due to CKD stage 3b Prior to 08-21-2023. - Most recent echo December 2023: LVEF 55 to 60%, moderate LVH, indeterminate diastolic dysfunction.  - Minimally elevated BNP - Fluids given at intake, will resume home p.o. diuretics in the next 24 to 48 hours pending volume status and renal recovery  08-21-2023 pt is euvolemic at discharge. He will continue with demadex  20 mg daily, imdur  30 mg daily, coreg  6.25 mg bid, aldactone  12.5 mg daily. No ARB/ACEI due to CKD stage 3b  Obesity, Class III, BMI 40-49.9 (morbid obesity) Body mass index is 37.23 kg/m (pended).   Essential hypertension Prior to 08-21-2023. Continue carvedilol  ; hold on his amlodipine , hydralazine , spironolactone    08-21-2023 at time of discharge, he will be sent to SNF on demadex  20 mg daily, imdur  30 mg daily, coreg  6.25 mg bid, aldactone  12.5 mg daily.  OSA treated with BiPAP 08-21-2023 chronic. Needs bipap and O2 at night while sleeping. Pt may use his own bipap device and his home settings.   DVT prophylaxis: SCDs Start: 08/14/23 2154    Code Status: Limited: Do  not attempt resuscitation (DNR) -DNR-LIMITED -Do Not Intubate/DNI  Family Communication: no family at bedside Disposition Plan: SNF Reason for continuing need for hospitalization: stable for DC.  Objective: Vitals:   08/20/23 1655 08/20/23 2044 08/21/23 0445 08/21/23 0724  BP: (!) 151/53 (!) 143/59 (!) 140/57 (!) 136/59  Pulse: 60 61 63 65  Resp:  20 20 19   Temp: 98.9 F (37.2 C) 98.4 F (36.9 C) 97.8 F (36.6 C) 98 F (36.7 C)  TempSrc:    Oral  SpO2: 95% 96% 98% 97%  Weight:      Height:        Intake/Output Summary (Last 24 hours) at 08/21/2023 0959 Last data filed at 08/21/2023 0600 Gross per 24 hour  Intake 1000 ml  Output 3100 ml  Net -2100 ml   Filed Weights   08/18/23 0459  08/19/23 0500 08/20/23 0500  Weight: 127.7 kg 127.7 kg 128 kg    Examination:  Physical Exam Vitals and nursing note reviewed.  Constitutional:      Appearance: He is obese.  HENT:     Head: Normocephalic and atraumatic.  Eyes:     General: No scleral icterus. Cardiovascular:     Rate and Rhythm: Normal rate and regular rhythm.  Pulmonary:     Effort: Pulmonary effort is normal. No respiratory distress.     Breath sounds: No wheezing.     Comments: Wearing [place] O2 @ 4-5 L/min Abdominal:     General: Abdomen is protuberant. Bowel sounds are normal. There is no distension.     Palpations: Abdomen is soft.  Skin:    Capillary Refill: Capillary refill takes less than 2 seconds.  Neurological:     Mental Status: He is oriented to person, place, and time.     Data Reviewed: I have personally reviewed following labs and imaging studies  CBC: Recent Labs  Lab 08/14/23 1641 08/14/23 1653 08/15/23 0455 08/17/23 0319 08/18/23 0524  WBC 15.3*  --  12.8* 15.3* 16.5*  NEUTROABS 9.9*  --   --   --   --   HGB 11.3* 11.9* 11.5* 12.3* 12.0*  HCT 34.7* 35.0* 35.3* 38.9* 37.6*  MCV 91.8  --  91.5 94.0 92.2  PLT 291  --  284 348 378   Basic Metabolic Panel: Recent Labs  Lab 08/14/23 1641 08/14/23 1653 08/15/23 0455 08/17/23 0319 08/18/23 0524  NA 133* 133* 134* 136 137  K 4.2 4.2 4.1 5.0 4.7  CL 101 102 102 102 103  CO2 22  --  25 26 26   GLUCOSE 135* 135* 114* 197* 132*  BUN 31* 32* 35* 43* 48*  CREATININE 1.82* 1.80* 1.59* 1.61* 1.47*  CALCIUM 11.0*  --  11.1* 11.7* 12.5*  MG  --   --  2.1  --   --   PHOS  --   --  3.0  --   --    GFR: Estimated Creatinine Clearance: 52.4 mL/min (A) (by C-G formula based on SCr of 1.47 mg/dL (H)). Liver Function Tests: Recent Labs  Lab 08/14/23 1641  AST 30  ALT 23  ALKPHOS 73  BILITOT 1.0  PROT 7.0  ALBUMIN  2.5*   BNP (last 3 results) Recent Labs    12/03/22 1042 12/06/22 0828 08/14/23 1641  BNP 125.7* 89.1 171.5*    CBG: Recent Labs  Lab 08/16/23 2218  GLUCAP 212*   Sepsis Labs: Recent Labs  Lab 08/14/23 1654 08/14/23 1844  LATICACIDVEN 1.0 0.8    Recent Results (  from the past 240 hours)  Urine Culture     Status: Abnormal   Collection Time: 08/14/23  6:00 PM   Specimen: Urine, Random  Result Value Ref Range Status   Specimen Description URINE, RANDOM  Final   Special Requests   Final    NONE Reflexed from [id number] Performed at [facility], [address], [place], [place] [address]    Culture >=100,000 COLONIES/mL PROTEUS MIRABILIS (A)  Final   Report Status 08/16/2023 FINAL  Final   Organism ID, Bacteria PROTEUS MIRABILIS (A)  Final      Susceptibility   Proteus mirabilis - MIC*    AMPICILLIN <=2 SENSITIVE Sensitive     CEFAZOLIN  8 SENSITIVE Sensitive     CEFEPIME  <=0.12 SENSITIVE Sensitive     CEFTRIAXONE  <=0.25 SENSITIVE Sensitive     CIPROFLOXACIN  <=0.25 SENSITIVE Sensitive     GENTAMICIN <=1 SENSITIVE Sensitive     IMIPENEM 4 SENSITIVE Sensitive     NITROFURANTOIN 256 RESISTANT Resistant     TRIMETH/SULFA <=20 SENSITIVE Sensitive     AMPICILLIN/SULBACTAM <=2 SENSITIVE Sensitive     PIP/TAZO <=4 SENSITIVE Sensitive ug/mL    * >=100,000 COLONIES/mL PROTEUS MIRABILIS  Respiratory (~20 pathogens) panel by PCR     Status: None   Collection Time: 08/14/23  8:15 PM   Specimen: Urine, Clean Catch; Respiratory  Result Value Ref Range Status   Adenovirus NOT DETECTED NOT DETECTED Final   Coronavirus 229E NOT DETECTED NOT DETECTED Final    Comment: (NOTE) The Coronavirus on the Respiratory Panel, DOES NOT test for the novel  Coronavirus (2019 nCoV)    Coronavirus HKU1 NOT DETECTED NOT DETECTED Final   Coronavirus NL63 NOT DETECTED NOT DETECTED Final   Coronavirus OC43 NOT DETECTED NOT DETECTED Final   Metapneumovirus NOT DETECTED NOT DETECTED Final   Rhinovirus / Enterovirus NOT DETECTED NOT DETECTED Final   Influenza A NOT DETECTED NOT DETECTED Final   Influenza B NOT  DETECTED NOT DETECTED Final   Parainfluenza Virus 1 NOT DETECTED NOT DETECTED Final   Parainfluenza Virus 2 NOT DETECTED NOT DETECTED Final   Parainfluenza Virus 3 NOT DETECTED NOT DETECTED Final   Parainfluenza Virus 4 NOT DETECTED NOT DETECTED Final   Respiratory Syncytial Virus NOT DETECTED NOT DETECTED Final   Bordetella pertussis NOT DETECTED NOT DETECTED Final   Bordetella Parapertussis NOT DETECTED NOT DETECTED Final   Chlamydophila pneumoniae NOT DETECTED NOT DETECTED Final   Mycoplasma pneumoniae NOT DETECTED NOT DETECTED Final    Comment: Performed at [facility], [address] [place], [place] [address]  Culture, blood (Routine X 2) w Reflex to ID Panel     Status: None   Collection Time: 08/14/23 10:12 PM   Specimen: BLOOD RIGHT HAND  Result Value Ref Range Status   Specimen Description BLOOD RIGHT HAND  Final   Special Requests   Final    BOTTLES DRAWN AEROBIC AND ANAEROBIC Blood Culture adequate volume   Culture   Final    NO GROWTH 5 DAYS Performed at [facility], [address], [place], [place] [address]    Report Status 08/19/2023 FINAL  Final  Culture, blood (Routine X 2) w Reflex to ID Panel     Status: None   Collection Time: 08/14/23 10:12 PM   Specimen: BLOOD LEFT HAND  Result Value Ref Range Status   Specimen Description BLOOD LEFT HAND  Final   Special Requests   Final    BOTTLES  DRAWN AEROBIC AND ANAEROBIC Blood Culture adequate volume   Culture   Final    NO GROWTH 5 DAYS Performed at [facility], [address] [place], [place] [address]    Report Status 08/19/2023 FINAL  Final     Scheduled Meds:  aspirin  EC  81 mg Oral Daily   carvedilol   6.25 mg Oral BID WC   cefUROXime   500 mg Oral BID WC   DULoxetine   60 mg Oral Daily   feeding supplement  237 mL Oral BID BM   isosorbide  mononitrate  30 mg Oral Daily   multivitamin with minerals  1 tablet Oral Daily   nystatin   5 mL Mouth/Throat TID AC & HS   pravastatin   40 mg  Oral Daily   sodium chloride  flush  3 mL Intravenous Q12H   tamsulosin   0.4 mg Oral Daily   torsemide   20 mg Oral Daily   Vitamin D  (Ergocalciferol )  50,000 Units Oral Q Sat   Continuous Infusions:   LOS: 7 days   Time spent: 55 minutes  Camellia Door, DO  [facility]  08/21/2023, 9:59 AM

## 2023-08-21 NOTE — Assessment & Plan Note (Addendum)
 Prior to 08-20-2024. - Malodorous urine with notable bacteria on UA and pyuria with leukocytosis, tachycardia, altered mental status. - Continue ceftriaxone  x 7 days, culture resistant only to nitrofurantoin - Cystoscopy with lithotripsy and ureteral stenting 7/11 - tolerated procedure well per urology -appreciate insight recommendations -Plan for stent removal in 1 to 2 weeks with Dr. Watt outpatient - Leukocytosis uptrending mildly after stone destruction, not unexpected given likely nidus for infection now exposed 08-21-2023 urine cx grew Proteus mirabilis. Pan-sensitive except for macrobid. Pt completed 3 days of IV rocephin  for his UTI. And he was transitioned to complete a total of 5 days of cefuroxime . He has another 2 days of po abx at [facility]

## 2023-08-21 NOTE — Assessment & Plan Note (Signed)
 Prior to 08-21-2023. - Viral panel negative  - Recent sick contacts in the family however no indication for antivirals given timing and negative respiratory PCR - Treat empirically with ceftriaxone  in setting of above with azithromycin  x 5 days - Patient is without acute hypoxia but has known chronic hypoxia on 4 L nasal cannula with BiPAP at night  08-21-2023 pt treated empirically for CAP. He will complete an additional days of ceftin  to complete his UTI treatment.

## 2023-08-21 NOTE — Assessment & Plan Note (Signed)
 08-21-2023 chronic. Baseline scr 1.4-1.8. no ACEI/ARB due to CKD.

## 2023-08-21 NOTE — Progress Notes (Signed)
 Attempted to call report to [facility]. Secretary stated he will be going to room 307. Transferred to nurse, no answer and unable to leave VM.

## 2023-08-21 NOTE — Assessment & Plan Note (Signed)
 08-21-2023 needs SNF for rehab

## 2023-08-21 NOTE — Subjective & Objective (Signed)
 Pt seen and examined. SNF bed and ins authorization obtained. Stable for DC.

## 2023-08-22 DIAGNOSIS — B370 Candidal stomatitis: Secondary | ICD-10-CM | POA: Diagnosis not present

## 2023-08-22 DIAGNOSIS — N390 Urinary tract infection, site not specified: Secondary | ICD-10-CM | POA: Diagnosis not present

## 2023-08-22 DIAGNOSIS — R627 Adult failure to thrive: Secondary | ICD-10-CM | POA: Diagnosis not present

## 2023-08-22 DIAGNOSIS — Z7189 Other specified counseling: Secondary | ICD-10-CM | POA: Diagnosis not present

## 2023-08-22 DIAGNOSIS — Z79899 Other long term (current) drug therapy: Secondary | ICD-10-CM | POA: Diagnosis not present

## 2023-08-23 DIAGNOSIS — Z79899 Other long term (current) drug therapy: Secondary | ICD-10-CM | POA: Diagnosis not present

## 2023-08-23 DIAGNOSIS — E039 Hypothyroidism, unspecified: Secondary | ICD-10-CM | POA: Diagnosis not present

## 2023-08-23 DIAGNOSIS — E119 Type 2 diabetes mellitus without complications: Secondary | ICD-10-CM | POA: Diagnosis not present

## 2023-08-26 DIAGNOSIS — D72829 Elevated white blood cell count, unspecified: Secondary | ICD-10-CM | POA: Diagnosis not present

## 2023-08-26 DIAGNOSIS — Z79899 Other long term (current) drug therapy: Secondary | ICD-10-CM | POA: Diagnosis not present

## 2023-08-26 DIAGNOSIS — G4733 Obstructive sleep apnea (adult) (pediatric): Secondary | ICD-10-CM | POA: Diagnosis not present

## 2023-08-26 DIAGNOSIS — J449 Chronic obstructive pulmonary disease, unspecified: Secondary | ICD-10-CM | POA: Diagnosis not present

## 2023-08-26 DIAGNOSIS — N201 Calculus of ureter: Secondary | ICD-10-CM | POA: Diagnosis not present

## 2023-08-27 DIAGNOSIS — N1831 Chronic kidney disease, stage 3a: Secondary | ICD-10-CM | POA: Diagnosis not present

## 2023-08-27 DIAGNOSIS — R627 Adult failure to thrive: Secondary | ICD-10-CM | POA: Diagnosis not present

## 2023-08-27 DIAGNOSIS — K219 Gastro-esophageal reflux disease without esophagitis: Secondary | ICD-10-CM | POA: Diagnosis not present

## 2023-08-27 DIAGNOSIS — Z79899 Other long term (current) drug therapy: Secondary | ICD-10-CM | POA: Diagnosis not present

## 2023-08-28 DIAGNOSIS — Z79899 Other long term (current) drug therapy: Secondary | ICD-10-CM | POA: Diagnosis not present

## 2023-08-28 DIAGNOSIS — N201 Calculus of ureter: Secondary | ICD-10-CM | POA: Diagnosis not present

## 2023-08-28 DIAGNOSIS — J449 Chronic obstructive pulmonary disease, unspecified: Secondary | ICD-10-CM | POA: Diagnosis not present

## 2023-08-28 DIAGNOSIS — G4733 Obstructive sleep apnea (adult) (pediatric): Secondary | ICD-10-CM | POA: Diagnosis not present

## 2023-08-28 DIAGNOSIS — Z9981 Dependence on supplemental oxygen: Secondary | ICD-10-CM | POA: Diagnosis not present

## 2023-08-28 DIAGNOSIS — D72829 Elevated white blood cell count, unspecified: Secondary | ICD-10-CM | POA: Diagnosis not present

## 2023-08-28 DIAGNOSIS — N390 Urinary tract infection, site not specified: Secondary | ICD-10-CM | POA: Diagnosis not present

## 2023-08-28 DIAGNOSIS — K219 Gastro-esophageal reflux disease without esophagitis: Secondary | ICD-10-CM | POA: Diagnosis not present

## 2023-08-28 DIAGNOSIS — N1831 Chronic kidney disease, stage 3a: Secondary | ICD-10-CM | POA: Diagnosis not present

## 2023-08-29 DIAGNOSIS — Z79899 Other long term (current) drug therapy: Secondary | ICD-10-CM | POA: Diagnosis not present

## 2023-08-29 DIAGNOSIS — N201 Calculus of ureter: Secondary | ICD-10-CM | POA: Diagnosis not present

## 2023-08-29 DIAGNOSIS — R52 Pain, unspecified: Secondary | ICD-10-CM | POA: Diagnosis not present

## 2023-09-02 DIAGNOSIS — N1831 Chronic kidney disease, stage 3a: Secondary | ICD-10-CM | POA: Diagnosis not present

## 2023-09-02 DIAGNOSIS — D72829 Elevated white blood cell count, unspecified: Secondary | ICD-10-CM | POA: Diagnosis not present

## 2023-09-02 DIAGNOSIS — R52 Pain, unspecified: Secondary | ICD-10-CM | POA: Diagnosis not present

## 2023-09-02 DIAGNOSIS — Z9981 Dependence on supplemental oxygen: Secondary | ICD-10-CM | POA: Diagnosis not present

## 2023-09-02 DIAGNOSIS — G4733 Obstructive sleep apnea (adult) (pediatric): Secondary | ICD-10-CM | POA: Diagnosis not present

## 2023-09-02 DIAGNOSIS — Z79899 Other long term (current) drug therapy: Secondary | ICD-10-CM | POA: Diagnosis not present

## 2023-09-02 DIAGNOSIS — K219 Gastro-esophageal reflux disease without esophagitis: Secondary | ICD-10-CM | POA: Diagnosis not present

## 2023-09-02 DIAGNOSIS — J449 Chronic obstructive pulmonary disease, unspecified: Secondary | ICD-10-CM | POA: Diagnosis not present

## 2023-09-02 DIAGNOSIS — N201 Calculus of ureter: Secondary | ICD-10-CM | POA: Diagnosis not present

## 2023-09-09 DIAGNOSIS — R0989 Other specified symptoms and signs involving the circulatory and respiratory systems: Secondary | ICD-10-CM | POA: Diagnosis not present

## 2023-09-09 DIAGNOSIS — R109 Unspecified abdominal pain: Secondary | ICD-10-CM | POA: Diagnosis not present

## 2023-09-10 ENCOUNTER — Other Ambulatory Visit: Payer: Self-pay | Admitting: Urology

## 2023-09-12 DIAGNOSIS — N1831 Chronic kidney disease, stage 3a: Secondary | ICD-10-CM | POA: Diagnosis not present

## 2023-09-12 DIAGNOSIS — K219 Gastro-esophageal reflux disease without esophagitis: Secondary | ICD-10-CM | POA: Diagnosis not present

## 2023-09-12 DIAGNOSIS — Z79899 Other long term (current) drug therapy: Secondary | ICD-10-CM | POA: Diagnosis not present

## 2023-09-12 DIAGNOSIS — J449 Chronic obstructive pulmonary disease, unspecified: Secondary | ICD-10-CM | POA: Diagnosis not present

## 2023-09-12 DIAGNOSIS — B370 Candidal stomatitis: Secondary | ICD-10-CM | POA: Diagnosis not present

## 2023-09-12 DIAGNOSIS — Z7189 Other specified counseling: Secondary | ICD-10-CM | POA: Diagnosis not present

## 2023-09-12 DIAGNOSIS — D72829 Elevated white blood cell count, unspecified: Secondary | ICD-10-CM | POA: Diagnosis not present

## 2023-09-12 DIAGNOSIS — R627 Adult failure to thrive: Secondary | ICD-10-CM | POA: Diagnosis not present

## 2023-09-13 DIAGNOSIS — M6281 Muscle weakness (generalized): Secondary | ICD-10-CM | POA: Diagnosis not present

## 2023-09-13 DIAGNOSIS — J9611 Chronic respiratory failure with hypoxia: Secondary | ICD-10-CM | POA: Diagnosis not present

## 2023-09-13 DIAGNOSIS — R1312 Dysphagia, oropharyngeal phase: Secondary | ICD-10-CM | POA: Diagnosis not present

## 2023-09-13 NOTE — Progress Notes (Addendum)
 For Anesthesia: PCP - Dr. Lynwood Murrain Cardiologist - [name]  Bowel Prep reminder: [name]  Chest x-ray - 08/14/23 in [facility] 09/09/23 in chart EKG - 08/14/23 in [facility] Stress Test - 09/30/19 in [facility] ECHO - 01/17/22 in [facility] Cardiac Cath - 01/1995 Pacemaker/ICD device last checked: [name] Pacemaker orders received: [name] Device Rep notified: [name]  Spinal Cord Stimulator: [name]  Sleep Study - Yes  CPAP - Yes  Fasting Blood Sugar - [name] Checks Blood Sugar __[name]___ times a day Date and result of last Hgb A1c- 08/24/23 (6.4) in chart sent from [facility]  Last dose of GLP1 agonist- [name] GLP1 instructions: [name]  Last dose of SGLT-2 inhibitors- [name] SGLT-2 instructions: [name]  Blood Thinner Instructions: Aspirin  Instructions: ASA 81 mg  Last Dose:  Activity level: Unable to go up a flight of stairs without chest pain and/or shortness of breath    Anesthesia review:  CAD, COPD, CHF,    Patient denies shortness of breath, fever, cough and chest pain at PAT appointment   Patient verbalized understanding of instructions that were reviewed over the telephone.

## 2023-09-13 NOTE — Patient Instructions (Addendum)
 Preop instructions for:  [name]    Date of Birth: 05/16/1940                      Date of Procedure: Friday, Aug. 22, 2025      Procedure:   LEFT REMOVAL, STENT, URETER, CYSTOSCOPIC   Surgeon: Dr. Donnice Brooks Facility contact:  [facility]     Phone:  [phone]               Health Care POA: RN contact name/phone#: Jon Croak, RN                        and Fax #: [phone]   Transportation contact phone#: Non-EMS [phone]  Please send day of procedure:  Current med list  Medications taken the day of procedure (return attached form to hospital) confirm time of nothing by mouth status (return attached form to hospital) Patient Demographic info( to include DNR status, problem list, allergies) Bring Insurance card and picture ID    Time to arrive at [facility]:  7:45 AM   Report to: Admitting (On your left hand side)   Bring CPAP mask and tubing day of surgery   Do not eat solid food or drink past midnight the night before your procedure.(To include any tube feedings-must be discontinued)   Take these morning medications only with sips of water.(or give through gastrostomy or feeding tube).  Duloxetine  Isosorbide  Carvedilol  Tylenol  if needed Use Nebulizer if needed Bring rescue inhaler   Stop all vitamins and herbal supplements 7 days before surgery.   Note: No Insulin  or Diabetic meds should be given or taken the morning of the procedure!  Oral Hygiene is also important to reduce your risk of infection.                                    Remember - BRUSH YOUR TEETH THE MORNING OF SURGERY WITH YOUR REGULAR TOOTHPASTE   DENTURES WILL BE REMOVED PRIOR TO SURGERY PLEASE DO NOT APPLY Poly grip OR ADHESIVES!!!  Leave all jewelry and other valuables at place where living( no metal or rings to be worn) No contact lens Men-no colognes,lotions   Any questions day of procedure,call  SHORT [phone]      Sent from :[facility]                   Phone:[phone]                   Fax:[phone]   Sent by :  Pamila Bertrand  BSN, RN

## 2023-09-14 DIAGNOSIS — R0989 Other specified symptoms and signs involving the circulatory and respiratory systems: Secondary | ICD-10-CM | POA: Diagnosis not present

## 2023-09-14 DIAGNOSIS — R1312 Dysphagia, oropharyngeal phase: Secondary | ICD-10-CM | POA: Diagnosis not present

## 2023-09-14 DIAGNOSIS — M6281 Muscle weakness (generalized): Secondary | ICD-10-CM | POA: Diagnosis not present

## 2023-09-14 DIAGNOSIS — R059 Cough, unspecified: Secondary | ICD-10-CM | POA: Diagnosis not present

## 2023-09-14 DIAGNOSIS — N390 Urinary tract infection, site not specified: Secondary | ICD-10-CM | POA: Diagnosis not present

## 2023-09-16 DIAGNOSIS — J449 Chronic obstructive pulmonary disease, unspecified: Secondary | ICD-10-CM | POA: Diagnosis not present

## 2023-09-16 DIAGNOSIS — I10 Essential (primary) hypertension: Secondary | ICD-10-CM | POA: Diagnosis not present

## 2023-09-16 DIAGNOSIS — M6281 Muscle weakness (generalized): Secondary | ICD-10-CM | POA: Diagnosis not present

## 2023-09-16 DIAGNOSIS — I2510 Atherosclerotic heart disease of native coronary artery without angina pectoris: Secondary | ICD-10-CM | POA: Diagnosis not present

## 2023-09-16 DIAGNOSIS — R1312 Dysphagia, oropharyngeal phase: Secondary | ICD-10-CM | POA: Diagnosis not present

## 2023-09-17 ENCOUNTER — Encounter (HOSPITAL_COMMUNITY): Payer: Self-pay | Admitting: Urology

## 2023-09-17 DIAGNOSIS — M6281 Muscle weakness (generalized): Secondary | ICD-10-CM | POA: Diagnosis not present

## 2023-09-17 DIAGNOSIS — R296 Repeated falls: Secondary | ICD-10-CM | POA: Diagnosis not present

## 2023-09-17 DIAGNOSIS — R1312 Dysphagia, oropharyngeal phase: Secondary | ICD-10-CM | POA: Diagnosis not present

## 2023-09-18 DIAGNOSIS — M6281 Muscle weakness (generalized): Secondary | ICD-10-CM | POA: Diagnosis not present

## 2023-09-18 DIAGNOSIS — J9611 Chronic respiratory failure with hypoxia: Secondary | ICD-10-CM | POA: Diagnosis not present

## 2023-09-18 DIAGNOSIS — R1312 Dysphagia, oropharyngeal phase: Secondary | ICD-10-CM | POA: Diagnosis not present

## 2023-09-19 ENCOUNTER — Encounter (HOSPITAL_COMMUNITY): Payer: Self-pay | Admitting: Urology

## 2023-09-19 ENCOUNTER — Other Ambulatory Visit: Payer: Self-pay | Admitting: Family Medicine

## 2023-09-19 DIAGNOSIS — N390 Urinary tract infection, site not specified: Secondary | ICD-10-CM | POA: Diagnosis not present

## 2023-09-19 DIAGNOSIS — R627 Adult failure to thrive: Secondary | ICD-10-CM | POA: Diagnosis not present

## 2023-09-19 DIAGNOSIS — I2510 Atherosclerotic heart disease of native coronary artery without angina pectoris: Secondary | ICD-10-CM | POA: Diagnosis not present

## 2023-09-19 DIAGNOSIS — I10 Essential (primary) hypertension: Secondary | ICD-10-CM | POA: Diagnosis not present

## 2023-09-19 DIAGNOSIS — J449 Chronic obstructive pulmonary disease, unspecified: Secondary | ICD-10-CM | POA: Diagnosis not present

## 2023-09-19 DIAGNOSIS — R1312 Dysphagia, oropharyngeal phase: Secondary | ICD-10-CM | POA: Diagnosis not present

## 2023-09-19 DIAGNOSIS — M6281 Muscle weakness (generalized): Secondary | ICD-10-CM | POA: Diagnosis not present

## 2023-09-19 DIAGNOSIS — J9611 Chronic respiratory failure with hypoxia: Secondary | ICD-10-CM | POA: Diagnosis not present

## 2023-09-19 NOTE — Anesthesia Preprocedure Evaluation (Signed)
 Anesthesia Evaluation  General Assessment Comment: Patient very lethargic, keeps nodding off during conversation. When he answers, he does answer appropriately, and is AO x 3.  Reviewed: Allergy & Precautions, Patient's Chart, lab work & pertinent test results  Airway Mallampati: IV  TM Distance: >3 FB Neck ROM: Limited    Dental  (+) Edentulous Upper, Edentulous Lower   Pulmonary asthma , sleep apnea (on Bipap) , COPD,  oxygen  dependent, former smoker          Cardiovascular hypertension, Pt. on medications and Pt. on home beta blockers + CAD and +CHF    ECHO 12/23:  1. Left ventricular ejection fraction, by estimation, is 55 to 60%. The  left ventricle has normal function. The left ventricle has no regional  wall motion abnormalities. There is moderate concentric left ventricular  hypertrophy. Indeterminate diastolic  filling due to E-A fusion.   2. Right ventricular systolic function was not well visualized. The right  ventricular size is not well visualized.   3. The mitral valve was not well visualized. No evidence of mitral valve  regurgitation.   4. The aortic valve was not well visualized. Aortic valve regurgitation  is not visualized. No aortic stenosis is present.   5. Aortic dilatation noted. There is mild dilatation of the aortic root,  measuring 41 mm.   6. The inferior vena cava is normal in size with greater than 50%  respiratory variability, suggesting right atrial pressure of 3 mmHg.   Comparison(s): No significant change from prior study.     Neuro/Psych  PSYCHIATRIC DISORDERS  Depression    negative neurological ROS     GI/Hepatic Neg liver ROS, hiatal hernia, PUD,GERD  ,,Dysphagia    Endo/Other  negative endocrine ROS    Renal/GU Renal disease  negative genitourinary   Musculoskeletal  (+) Arthritis , Osteoarthritis,    Abdominal   Peds  Hematology negative hematology ROS (+)   Anesthesia  Other Findings NKDA  Reproductive/Obstetrics                              Anesthesia Physical Anesthesia Plan  ASA: 4  Anesthesia Plan: General   Post-op Pain Management: Ofirmev  IV (intra-op)* and Precedex   Induction: Intravenous  PONV Risk Score and Plan: 3 and Ondansetron , Dexamethasone  and Treatment may vary due to age or medical condition  Airway Management Planned: LMA  Additional Equipment: None  Intra-op Plan:   Post-operative Plan: Extubation in OR  Informed Consent:      Dental advisory given  Plan Discussed with: CRNA and Surgeon  Anesthesia Plan Comments: (See PAT note from 8/14)        Anesthesia Quick Evaluation

## 2023-09-19 NOTE — Progress Notes (Signed)
 Case: [id number] Date/Time: 09/27/23 1030   Procedure: REMOVAL, STENT, URETER, CYSTOSCOPIC (Left)   Anesthesia type: Choice   Diagnosis: Calculus of ureter [N20.1]   Pre-op diagnosis: LEFT URETERAL STONE, LEFT URETERAL STENT   Location: [facility] / [facility]   Surgeons: Nieves Cough, MD       DISCUSSION: [name] is an 83 yo male with PMH of former smoking, HTN, hx of MI and CAD s/p PCI to LAD (1996), HFpEF, COPD with chronic respiratory failure on 4L O2, asthma, OSA (uses Bipap), GERD, PUD, Ulcerative colitis, prediabetes, CKD3, depression, failure to thrive  Patient admitted from 7/9-7/16 for complicated UTI with a L sided ureteral stone. Went to the OR on 08/16/23 for ureteroscopy with lithotripsy. No complications noted. Also had SOB with cough and URI symptoms. He was treated for CAP with antibiotics. Patient debilitated and discharged to SNF.  Patient previously followed with Cardiology for CAD s/p PCI in 1996 and HFpEF. Last seen in 2021 by Dr. Court. Echo in 2023 when he was inpatient showed LVEF 55%. Stress test in 2021 was low risk. Has been lost to follow up.  Hx of COPD with chronic respiratory failure, OSA on Bipap. Does not appear to follow with Pulmonology. It appears PCP is managing?   Last seen by PCP on 06/06/23  Case discussed with Dr. Mallory due to complex medical hx with poor follow up (although pt is currently at a SNF). Ok to proceed.  VS: Ht 6' 1 (1.854 m)   Wt 120.2 kg   BMI 34.96 kg/m   PROVIDERS: Alphonsa Glendia LABOR, MD   LABS: Obtain DOS   IMAGES: CXR 08/14/23:  FINDINGS: Lateral views are limited by patient's arms. Cardiomegaly with vascular congestion and mild diffuse interstitial opacity suggestive of mild edema. Suspect also small pleural effusions.   IMPRESSION: Cardiomegaly with vascular congestion and mild interstitial edema. Suspect small pleural effusions.    EKG 08/14/2023:  Sinus rhythm Prolonged PR interval Borderline  left axis deviation Nonspecific T abnormalities, lateral leads  CV:  Echo 01/17/2022:  IMPRESSIONS    1. Left ventricular ejection fraction, by estimation, is 55 to 60%. The left ventricle has normal function. The left ventricle has no regional wall motion abnormalities. There is moderate concentric left ventricular hypertrophy. Indeterminate diastolic filling due to E-A fusion.  2. Right ventricular systolic function was not well visualized. The right ventricular size is not well visualized.  3. The mitral valve was not well visualized. No evidence of mitral valve regurgitation.  4. The aortic valve was not well visualized. Aortic valve regurgitation is not visualized. No aortic stenosis is present.  5. Aortic dilatation noted. There is mild dilatation of the aortic root, measuring 41 mm.  6. The inferior vena cava is normal in size with greater than 50% respiratory variability, suggesting right atrial pressure of 3 mmHg.  Comparison(s): No significant change from prior study.  Stress test 09/30/2019:  The left ventricular ejection fraction is normal (55-65%). Nuclear stress EF: 56%. Baseline EKG demonstrates NSR with inferolateral T wave abnormality. There is no change in EKG with infusion. The study is normal. This is a low risk study.  Past Medical History:  Diagnosis Date   Ankylosing spondylitis ([name]) 11/07/2018   Asthma    BPH (benign prostatic hyperplasia)    CAD (coronary artery disease) 01/1995   Candidal stomatitis    CHF (congestive heart failure) ([name])    Chronic respiratory failure with hypoxia ([name])    CKD (chronic kidney  disease)    Clostridium difficile colitis 04/2005   Colitis 2011   COPD (chronic obstructive pulmonary disease) ([name])    Depression    Diaphragmatic hernia    Diverticulosis    DJD (degenerative joint disease)    Elevated white blood cell count    Erectile dysfunction    Fracture, Colles, left, closed 11/29/2016   Gastric ulcer  04/17/2010   Three 5mm gastric ulcers, H.pylori serologies were negative   GERD (gastroesophageal reflux disease)    History of gallstones    History of hiatal hernia    History of kidney stones    History of pneumonia    History of pressure ulcer    Sacral region   History of UTI    Hyperlipidemia    Hyperparathyroidism ([name])    Hypertension    Idiopathic chronic inflammatory bowel disease 05/18/2010   left-sided UC   Iron deficiency anemia    Malignant melanoma ([name])    Skin   Morbid obesity ([name]) 03/12/2018   Obstructive sleep apnea    on Cpap   Osteopenia    Pre-diabetes    Reflux 02/1995   S/P endoscopy 07/24/2010   retained gastric contents, benign bx   Status post cervical spinal fusion 07/19/2020   Ulcerative colitis ([name])    Vitamin D  deficiency    Wears hearing aid in both ears     Past Surgical History:  Procedure Laterality Date   ANORECTAL MANOMETRY  2016   [facility]: concern for possible fissure. Noted pelvic floor dyssnyergy   BIOPSY  07/29/2017   Procedure: BIOPSY;  Surgeon: Shaaron Lamar HERO, MD;  Location: [facility];  Service: Endoscopy;;  ascending and sigmoid colon   CARDIAC CATHETERIZATION     with stent   CARDIOVASCULAR STRESS TEST  07/21/2009   No scintigraphic evidence of inducible myocardial ischemia   CARPAL TUNNEL RELEASE Left 01/17/2017   Procedure: LEFT CARPAL TUNNEL RELEASE;  Surgeon: Margrette Taft BRAVO, MD;  Location: [facility];  Service: Orthopedics;  Laterality: Left;   CATARACT EXTRACTION, BILATERAL Bilateral    CERVICAL SPINE SURGERY     C4-5   COLONOSCOPY  04/2005   granularity and friability erosions from rectum to 40cm. Bx infection vs IBD. C. Diff positive at the time.    COLONOSCOPY  05/2010   [facility]: left-sided UC, bx with no dysplasia, shallow diverticula   COLONOSCOPY [name] 11/07/2012   MFM:[id number] proctocolitis status post segmental biopsy/Sigmoid colon polyps removed as described above. Procedure compromisd by technical  difficulties. bx: Inflammation limited to sigmoid and rectum on pathology.   COLONOSCOPY  04/2014   Dr. Landy at [facility]: well localized proctocolitis limited to sigmoid   COLONOSCOPY WITH PROPOFOL  [name] 07/29/2017   diverticulosis in colon, three 4-6 mm polyps at splenic flexure and in cecum, one 10 mm polyp in rectum, abnormal rectum and sigmoid consistent with active UC   CORONARY STENT PLACEMENT  01/1995   CYSTOSCOPY/URETEROSCOPY/HOLMIUM LASER/STENT PLACEMENT Left 08/16/2023   Procedure: CYSTOSCOPY, URETEROSCOPY, LASER LITHOTRIPSY, STENT PLACEMENT, RETROGRADE PYLOGRAM;  Surgeon: Francisca Redell BROCKS, MD;  Location: [facility] OR;  Service: Urology;  Laterality: Left;   ESOPHAGEAL DILATION  01/20/2022   Procedure: ESOPHAGEAL DILATION;  Surgeon: Elicia Claw, MD;  Location: [facility];  Service: Gastroenterology;;   ESOPHAGOGASTRODUODENOSCOPY  07/24/2010   MFM:Wnmfjo esophagus   ESOPHAGOGASTRODUODENOSCOPY  04/2014   Dr[facility] at [facility]: negative small bowel biopsies   ESOPHAGOGASTRODUODENOSCOPY (EGD) WITH PROPOFOL  [name] 01/20/2022   Procedure: ESOPHAGOGASTRODUODENOSCOPY (EGD) WITH PROPOFOL ;  Surgeon: Elicia Claw,  MD;  Location: [facility];  Service: Gastroenterology;  Laterality: [name];   FOOT SURGERY Bilateral two   KNEE SURGERY Bilateral two   Bilateral Knee replacements   POLYPECTOMY  07/29/2017   Procedure: POLYPECTOMY;  Surgeon: Shaaron Lamar HERO, MD;  [facility];  Service: Endoscopy;;  splenic flexure, ascending colon polyp;rectal   SHOULDER SURGERY  two   SKIN CANCER EXCISION     TONSILLECTOMY     TRANSTHORACIC ECHOCARDIOGRAM  03/23/2009   EF 60-65%, normal LV systolic function   ULNAR HEAD EXCISION Left 01/17/2017   Procedure: LEFT ULNAR HEAD RESECTION;  Surgeon: Margrette Taft BRAVO, MD;  Location: [facility];  Service: Orthopedics;  Laterality: Left;    MEDICATIONS: No current facility-administered medications for this encounter.    acetaminophen  (TYLENOL ) 500 MG  tablet   Amino Acids-Protein Hydrolys (FEEDING SUPPLEMENT, PRO-STAT 64,) LIQD   ammonium lactate  (AMLACTIN DAILY) 12 % lotion   ascorbic acid (VITAMIN C) 500 MG tablet   aspirin  EC 81 MG tablet   carvedilol  (COREG ) 6.25 MG tablet   clobetasol (TEMOVATE) 0.05 % external solution   DULoxetine  (CYMBALTA ) 60 MG capsule   hydrocortisone  (ANUSOL -HC) 2.5 % rectal cream   isosorbide  mononitrate (IMDUR ) 60 MG 24 hr tablet   lidocaine  4 %   Menthol-Zinc  Oxide (CALMOSEPTINE) 0.44-20.6 % OINT   Multiple Vitamin (MULTIVITAMIN PO)   nitroGLYCERIN  (NITROSTAT ) 0.4 MG SL tablet   OXYGEN    phenylephrine  (,USE FOR PREPARATION-H,) 0.25 % suppository   pravastatin  (PRAVACHOL ) 40 MG tablet   spironolactone  (ALDACTONE ) 25 MG tablet   UNABLE TO FIND   zinc  sulfate, 50mg  elemental zinc , 220 (50 Zn) MG capsule   albuterol  (PROVENTIL ) (2.5 MG/3ML) 0.083% nebulizer solution   nystatin  (MYCOSTATIN ) 100000 UNIT/ML suspension   tamsulosin  (FLOMAX ) 0.4 MG CAPS capsule   Vitamin D , Ergocalciferol , (DRISDOL ) 1.25 MG (50000 UNIT) CAPS capsule

## 2023-09-20 ENCOUNTER — Other Ambulatory Visit: Payer: Self-pay

## 2023-09-20 DIAGNOSIS — J441 Chronic obstructive pulmonary disease with (acute) exacerbation: Secondary | ICD-10-CM | POA: Diagnosis not present

## 2023-09-20 DIAGNOSIS — M6281 Muscle weakness (generalized): Secondary | ICD-10-CM | POA: Diagnosis not present

## 2023-09-20 DIAGNOSIS — J9611 Chronic respiratory failure with hypoxia: Secondary | ICD-10-CM | POA: Diagnosis not present

## 2023-09-20 DIAGNOSIS — J9601 Acute respiratory failure with hypoxia: Secondary | ICD-10-CM | POA: Diagnosis not present

## 2023-09-20 DIAGNOSIS — R1312 Dysphagia, oropharyngeal phase: Secondary | ICD-10-CM | POA: Diagnosis not present

## 2023-09-20 DIAGNOSIS — R0902 Hypoxemia: Secondary | ICD-10-CM | POA: Diagnosis not present

## 2023-09-20 MED ORDER — CARVEDILOL 6.25 MG PO TABS: 6.2500 mg | ORAL_TABLET | Freq: Two times a day (BID) | ORAL | Status: DC

## 2023-09-20 MED ORDER — CARVEDILOL 6.25 MG PO TABS: 6.2500 mg | ORAL_TABLET | Freq: Two times a day (BID) | ORAL | 1 refills | Status: DC

## 2023-09-21 DIAGNOSIS — J9611 Chronic respiratory failure with hypoxia: Secondary | ICD-10-CM | POA: Diagnosis not present

## 2023-09-21 DIAGNOSIS — M6281 Muscle weakness (generalized): Secondary | ICD-10-CM | POA: Diagnosis not present

## 2023-09-21 DIAGNOSIS — R1312 Dysphagia, oropharyngeal phase: Secondary | ICD-10-CM | POA: Diagnosis not present

## 2023-09-22 DIAGNOSIS — M6281 Muscle weakness (generalized): Secondary | ICD-10-CM | POA: Diagnosis not present

## 2023-09-22 DIAGNOSIS — R1312 Dysphagia, oropharyngeal phase: Secondary | ICD-10-CM | POA: Diagnosis not present

## 2023-09-23 ENCOUNTER — Telehealth: Payer: Self-pay | Admitting: Family Medicine

## 2023-09-23 DIAGNOSIS — R1312 Dysphagia, oropharyngeal phase: Secondary | ICD-10-CM | POA: Diagnosis not present

## 2023-09-23 DIAGNOSIS — M6281 Muscle weakness (generalized): Secondary | ICD-10-CM | POA: Diagnosis not present

## 2023-09-23 NOTE — Telephone Encounter (Signed)
 Patient is requesting a refill on amlopidine bestyate  5 mg  not filled by this office and not on current medication list. Last filled by Dr. Joya Copa  on 04/12/19

## 2023-09-24 ENCOUNTER — Telehealth: Payer: Self-pay | Admitting: Family Medicine

## 2023-09-24 ENCOUNTER — Encounter: Payer: Self-pay | Admitting: Family Medicine

## 2023-09-24 DIAGNOSIS — M6281 Muscle weakness (generalized): Secondary | ICD-10-CM | POA: Diagnosis not present

## 2023-09-24 DIAGNOSIS — R1312 Dysphagia, oropharyngeal phase: Secondary | ICD-10-CM | POA: Diagnosis not present

## 2023-09-24 NOTE — Telephone Encounter (Signed)
 Patient is currently in Nursing Home and needing letter stating can no longer be at home. He unable to go to restroom or do for himself now. Daughter(Angela) states loss weight also. If need more information feel free to call Jon.

## 2023-09-24 NOTE — Telephone Encounter (Signed)
 Letter was dictated and should they need something in addition to this let us  know

## 2023-09-24 NOTE — Telephone Encounter (Signed)
 Nurses Currently right now is he in a nursing home?  Or assisted living? Does he have a doctor or medical provider where he is staying or are we still his doctor? If he is in assisted living where is this at-name of facility? If he is designated as nursing home-once again where he is he at-name of facility?  (If he is in the nursing home typically they have their own doctor who would handle the refills if he is in assisted living then that would be through us )

## 2023-09-25 DIAGNOSIS — R1312 Dysphagia, oropharyngeal phase: Secondary | ICD-10-CM | POA: Diagnosis not present

## 2023-09-25 DIAGNOSIS — M6281 Muscle weakness (generalized): Secondary | ICD-10-CM | POA: Diagnosis not present

## 2023-09-25 DIAGNOSIS — R296 Repeated falls: Secondary | ICD-10-CM | POA: Diagnosis not present

## 2023-09-25 DIAGNOSIS — J9611 Chronic respiratory failure with hypoxia: Secondary | ICD-10-CM | POA: Diagnosis not present

## 2023-09-25 NOTE — Telephone Encounter (Signed)
 Autumn Please handle this issue-I do not feel that others have a complete understanding of how nursing homes versus assisted living work (As for the letter I already did the letter) The request from family was for a refill of amlodipine -if he is in a nursing center it would be my understanding that there is a doctor managing his health issues If he is in assisted living it is quite possible that we are still listed So another words we need to figure this out-if he is under the care of a doctor at that nursing facility they should be the one to refill the amlodipine   Thank you for handling this issue-Blaze Sandin

## 2023-09-25 NOTE — Telephone Encounter (Signed)
 Patient is currently in [facility] in [place]. Jon is daughter came by yesterday and told us  that and requested the letter for him . She will be coming by tomorrow to pick it up

## 2023-09-26 DIAGNOSIS — R1312 Dysphagia, oropharyngeal phase: Secondary | ICD-10-CM | POA: Diagnosis not present

## 2023-09-26 DIAGNOSIS — M6281 Muscle weakness (generalized): Secondary | ICD-10-CM | POA: Diagnosis not present

## 2023-09-26 NOTE — Telephone Encounter (Signed)
 Daughter stated he is in a nursing care facility and does not need this medication- his blood pressure is good

## 2023-09-26 NOTE — H&P (Signed)
 Office Visit Report     08/28/2023   --------------------------------------------------------------------------------   [name]  MRN: [id number]  DOB: 1940/02/23, 83 year old Male  SSN: [id number]   PRIMARY CARE:  Scott A. Alphonsa, MD  PRIMARY CARE FAX:  [phone]  REFERRING:  Glendia A. Alphonsa, MD  PROVIDER:  Donnice Brooks, M.D.  TREATING:  Sherlyn Moats, NP  LOCATION:  [facility] [phone]     --------------------------------------------------------------------------------   CC/HPI: cc: LEft ureteral stone   HPI: 83 year old man who is oxygen  dependent and lives at [facility] and rehab presents today for possible left-sided stent removal. He is currently in a wheelchair and complaining of headache, neck pain and nausea. He states he is unable to stand. He is on oxygen . He does not feel that he can tolerate stent removal today. He also weighs close to 300 pounds and is very tall. I think he would be best served having his stent removed in the operating room.     ALLERGIES: No Known Drug Allergies    MEDICATIONS: Acetaminophen   Albuterol  Sulfate 2.5 MG/0.5ML Nebulization Solution  Ammonium Lactate  12 % Lotion  Aspirin  EC  Azithromycin  500 MG Tablet  Calmoseptine 0.44-20.6 % Ointment  Carvedilol  6.25 MG Tablet 1 tablet PO BID  Cefuroxime  Axetil 500 MG Tablet  Clobetasol Propionate 0.05 % Cream  DULoxetine  HCl 60 MG Capsule Delayed Release Particles  Isosorbide  Mononitrate ER 60 MG Tablet Extended Release 24 Hour  Multivitamin  Nitroglycerin  0.4 MG Tablet Sublingual  Nystatin  100000 UNIT/ML Suspension  Phenylephrine  HCl  Pravastatin  Sodium 40 MG Tablet  Spironolactone      GU PSH: None     PSH Notes: Neck Surgery, Foot Surgery, Shoulder Surgery, Knee Surgery   NON-GU PSH: Carpal tunnel surgery - 2018 Foot surgery (unspecified), Right - 1999 Knee replacement - 2007, 2003 Neck Surgery (Unspecified) - 2007, 2004 Rotator cuff  surgery - 1980 Shoulder Surgery (Unspecified)     GU PMH: Nocturia - 2019 Urinary Urgency - 2019 Overactive bladder, Overactive bladder - 2015 Urge incontinence, Urge incontinence of urine - 2015 BPH w/LUTS, Benign prostatic hyperplasia with urinary obstruction - 2015 Renal calculus, Nephrolithiasis - 2014    NON-GU PMH: Encounter for general adult medical examination without abnormal findings, Encounter for preventive health examination - 2015 Anxiety, Anxiety - 2014 Personal history of diseases of the blood and blood-forming organs and certain disorders involving the immune mechanism, History of bleeding disorder - 2014 Personal history of other diseases of the circulatory system, History of cardiac arrhythmia - 2014, History of hypertension, - 2014 Personal history of other diseases of the digestive system, History of gastroesophageal reflux (GERD) - 2014 Personal history of other diseases of the musculoskeletal system and connective tissue, History of arthritis - 2014 Personal history of other diseases of the nervous system and sense organs, History of sleep apnea - 2014 Personal history of other endocrine, nutritional and metabolic disease, History of hypercholesterolemia - 2014 Arthritis Depression Duodenal ulcer, unspecified as acute or chronic, without hemorrhage or perforation Heart disease, unspecified Heartburn Hypercholesterolemia Hypertension Malignant melanoma of skin, unspecified Myocardial Infarction Other heart failure Sleep Apnea    FAMILY HISTORY: 3 daughters - Daughter Blood in the urine - Runs in Family Breast Cancer - Mother Deceased - Mother, Father Diabetes - Brother heart failure - Runs In Family Hematuria - Mother, Brother Kidney Failure - Brother Kidney Stones - Father, Mother, Brother Myocardial Infarction - Runs In Family   SOCIAL HISTORY: Marital Status: Widowed  Preferred Language: English; Race: White Current Smoking Status: Patient has  never smoked.   Tobacco Use Assessment Completed: Used Tobacco in last 30 days? Does not use smokeless tobacco. Has never drank.  Drinks 1 caffeinated drink per day. Patient's occupation is/was retired.     Notes: Former smoker, Caffeine use, Alcohol use, Widower   REVIEW OF SYSTEMS:    GU Review Male:   Patient reports frequent urination, hard to postpone urination, leakage of urine, stream starts and stops, trouble starting your stream, and have to strain to urinate . Patient denies burning/ pain with urination, get up at night to urinate, erection problems, and penile pain.  Gastrointestinal (Upper):   Patient reports vomiting. Patient denies indigestion/ heartburn.  Gastrointestinal (Lower):   Patient reports diarrhea and constipation.   Constitutional:   Patient reports weight loss and fatigue. Patient denies fever and night sweats.  Skin:   Patient reports itching. Patient denies skin rash/ lesion.  Eyes:   Patient denies blurred vision and double vision.  Ears/ Nose/ Throat:   Patient reports sore throat and sinus problems.   Hematologic/Lymphatic:   Patient reports swollen glands and easy bruising.   Cardiovascular:   Patient reports chest pains. Patient denies leg swelling.  Respiratory:   Patient reports cough and shortness of breath.   Endocrine:   Patient reports excessive thirst.   Musculoskeletal:   Patient reports back pain and joint pain.   Neurological:   Patient reports dizziness and headaches.   Psychologic:   Patient denies depression and anxiety.   Notes: kidney stone, weak stream, injury to bladder or kidneys     VITAL SIGNS:      08/28/2023 10:11 AM  Weight 262 lb / 118.84 kg  Height 73 in / 185.42 cm  BP 140/77 mmHg  Pulse 65 /min  Temperature 97.3 F / 36.2 C  BMI 34.6 kg/m   MULTI-SYSTEM PHYSICAL EXAMINATION:    Constitutional: Obese. Mild physical deformities. Normally developed. Good grooming.   Respiratory: No labored breathing, no use of accessory  muscles. Dependent on home oxygen   Cardiovascular: Normal temperature, normal extremity pulses, no swelling, no varicosities.  Skin: No paleness, no jaundice, no cyanosis. No lesion, no ulcer, no rash.  Neurologic / Psychiatric: Patient agitated. Oriented to time, oriented to place, oriented to person. No depression, no anxiety. Patient is oriented to time and place but there is obvious confusion with some of his answers to questions.  Gastrointestinal: No mass, no tenderness, no rigidity, non obese abdomen.     Complexity of Data:  Source Of History:  Patient, Healthcare Provider  Records Review:   Previous Hospital Records, Previous Patient Records   PROCEDURES:          Visit Complexity - G2211          Ketoralac 30mg  - [id number], [phone] 15mg  given, 15 wasted    Qty: 30 Adm. By: Jennaya Davis, NP  Unit: mg Adm. On: 08/28/2023 10:52 AM  Route: IM Lot No: [id number]  Freq: None Exp. Date: 02/05/2025    Mfgr.:   Site: right upper thigh    ASSESSMENT:      ICD-10 Details  1 GU:   Ureteral calculus - N20.1 Left, Acute, Systemic Symptoms  2   Pelvic/perineal pain - R10.2 Acute, Uncomplicated   PLAN:           Schedule Procedure: 08/28/2023 at [facility] - [phone] - Ketoralac 30mg  (Toradol  Per 15 Mg) - G8114, [id number] Notes: 15mg  given  and 15mg  wasted          Document Letter(s):  Created for Patient: Clinical Summary         Notes:   He was in a lot of pain with his headache and neck today. I gave him a 30 mg dose of Toradol  as he takes ibuprofen at home when this happens. We felt it was safest not to remove the stent in office as he did not feel he could stand or transfer. I discussed this with his surgeon and we will set up for stent removal in the OR which can likely be done without anesthesia.     * Signed by Sherlyn Moats, NP on 08/28/23 at 6:11 PM (EDT)*

## 2023-09-27 ENCOUNTER — Ambulatory Visit (HOSPITAL_COMMUNITY)
Admission: RE | Admit: 2023-09-27 | Discharge: 2023-09-27 | Disposition: A | Discharge status: Skilled Nursing Facility | Attending: Urology | Admitting: Urology

## 2023-09-27 ENCOUNTER — Encounter (HOSPITAL_COMMUNITY)
Admission: RE | Disposition: A | Discharge status: Skilled Nursing Facility | Payer: Self-pay | Source: Home / Self Care | Attending: Urology

## 2023-09-27 ENCOUNTER — Encounter (HOSPITAL_COMMUNITY): Payer: Self-pay | Admitting: Urology

## 2023-09-27 ENCOUNTER — Encounter (HOSPITAL_COMMUNITY): Payer: Self-pay | Admitting: Medical

## 2023-09-27 DIAGNOSIS — M542 Cervicalgia: Secondary | ICD-10-CM | POA: Diagnosis not present

## 2023-09-27 DIAGNOSIS — Z960 Presence of urogenital implants: Secondary | ICD-10-CM | POA: Diagnosis not present

## 2023-09-27 DIAGNOSIS — R1312 Dysphagia, oropharyngeal phase: Secondary | ICD-10-CM | POA: Diagnosis not present

## 2023-09-27 DIAGNOSIS — J9611 Chronic respiratory failure with hypoxia: Secondary | ICD-10-CM | POA: Diagnosis not present

## 2023-09-27 DIAGNOSIS — E611 Iron deficiency: Secondary | ICD-10-CM

## 2023-09-27 DIAGNOSIS — N202 Calculus of kidney with calculus of ureter: Secondary | ICD-10-CM | POA: Diagnosis not present

## 2023-09-27 DIAGNOSIS — R102 Pelvic and perineal pain: Secondary | ICD-10-CM | POA: Diagnosis not present

## 2023-09-27 DIAGNOSIS — Z9981 Dependence on supplemental oxygen: Secondary | ICD-10-CM | POA: Diagnosis not present

## 2023-09-27 DIAGNOSIS — R519 Headache, unspecified: Secondary | ICD-10-CM | POA: Insufficient documentation

## 2023-09-27 DIAGNOSIS — Z466 Encounter for fitting and adjustment of urinary device: Secondary | ICD-10-CM | POA: Diagnosis not present

## 2023-09-27 DIAGNOSIS — M6281 Muscle weakness (generalized): Secondary | ICD-10-CM | POA: Diagnosis not present

## 2023-09-27 DIAGNOSIS — I10 Essential (primary) hypertension: Secondary | ICD-10-CM

## 2023-09-27 HISTORY — DX: Personal history of urinary (tract) infections: Z87.440

## 2023-09-27 HISTORY — DX: Iron deficiency anemia, unspecified: D50.9

## 2023-09-27 HISTORY — DX: Candidal stomatitis: B37.0

## 2023-09-27 HISTORY — DX: Hyperparathyroidism, unspecified: E21.3

## 2023-09-27 HISTORY — DX: Ulcerative colitis, unspecified, without complications: K51.90

## 2023-09-27 HISTORY — DX: Diaphragmatic hernia without obstruction or gangrene: K44.9

## 2023-09-27 HISTORY — DX: Personal history of pneumonia (recurrent): Z87.01

## 2023-09-27 HISTORY — DX: Presence of external hearing-aid: Z97.4

## 2023-09-27 HISTORY — DX: Prediabetes: R73.03

## 2023-09-27 HISTORY — DX: Chronic kidney disease, unspecified: N18.9

## 2023-09-27 HISTORY — DX: Vitamin D deficiency, unspecified: E55.9

## 2023-09-27 HISTORY — DX: Personal history of other diseases of the digestive system: Z87.19

## 2023-09-27 HISTORY — DX: Malignant melanoma of skin, unspecified: C43.9

## 2023-09-27 HISTORY — DX: Other specified disorders of bone density and structure, unspecified site: M85.80

## 2023-09-27 HISTORY — DX: Chronic respiratory failure with hypoxia: J96.11

## 2023-09-27 HISTORY — DX: Male erectile dysfunction, unspecified: N52.9

## 2023-09-27 HISTORY — DX: Elevated white blood cell count, unspecified: D72.829

## 2023-09-27 HISTORY — DX: Personal history of diseases of the skin and subcutaneous tissue: Z87.2

## 2023-09-27 LAB — GLUCOSE, CAPILLARY: Glucose-Capillary: 126 mg/dL — ABNORMAL HIGH (ref 70–99)

## 2023-09-27 SURGERY — REMOVAL, STENT, URETER, CYSTOSCOPIC
Anesthesia: Choice | Laterality: Left

## 2023-09-27 MED ORDER — CEFAZOLIN SODIUM-DEXTROSE 3-4 GM/150ML-% IV SOLN: 3.0000 g | INTRAVENOUS | Status: DC

## 2023-09-27 MED ORDER — ORAL CARE MOUTH RINSE: 15.0000 mL | Freq: Once | OROMUCOSAL | Status: DC

## 2023-09-27 MED ORDER — LACTATED RINGERS IV SOLN: INTRAVENOUS | Status: DC

## 2023-09-27 MED ORDER — CIPROFLOXACIN HCL 500 MG PO TABS
ORAL_TABLET | ORAL | Status: AC
  Filled 2023-09-27: qty 1

## 2023-09-27 MED ORDER — ACETAMINOPHEN 500 MG PO TABS
1000.0000 mg | ORAL_TABLET | Freq: Once | ORAL | Status: DC
  Filled 2023-09-27: qty 2

## 2023-09-27 MED ORDER — CIPROFLOXACIN HCL 500 MG PO TABS: 500.0000 mg | ORAL_TABLET | Freq: Once | ORAL | Status: DC

## 2023-09-27 MED ORDER — CHLORHEXIDINE GLUCONATE 0.12 % MT SOLN: 15.0000 mL | Freq: Once | OROMUCOSAL | Status: DC

## 2023-09-27 NOTE — Interval H&P Note (Signed)
 History and Physical Interval Note:  09/27/2023 10:05 AM  [name]  has presented today for surgery, with the diagnosis of LEFT URETERAL STONE, LEFT URETERAL STENT.  The various methods of treatment have been discussed with the patient and family. After consideration of risks, benefits and other options for treatment, the patient has consented to  Procedure(s): REMOVAL, STENT, URETER, CYSTOSCOPIC (Left) as a surgical intervention.  The patient's history has been reviewed, patient examined, no change in status, stable for surgery.  I have reviewed the patient's chart and labs.  He ate this AM and declines an IV. Therefore, will give po cipro  and tylenol . Use intraurethral lidocaine  and attempt at bedside. Questions were answered to the patient's satisfaction.  Otherwise, he's been well. Now standing and doing much better. No bladder pain or dysuria. No gross hematuria. Discussed may need OR if stent has resistance.    Donnice Brooks

## 2023-09-27 NOTE — Procedures (Signed)
 Preprocedure diagnosis: Kidney stone, ureteral stone Postprocedural diagnosis: Kidney stone, ureteral stone  Procedure: Cystoscopy with left ureteral stent extraction  Surgeon: Nieves  Anesthesia: Local  Indication for procedure: [name] is an 83 year old male with a history of distal ureteral stones who underwent a left ureteroscopy and stent placement.  He was not able to have the stent removed in the office setting and was brought to hospital.  We were planning to proceed to the operating room, but the patient ate and he did not want an IV placed.  Therefore he was given p.o. Tylenol  and Cipro  and the procedure was done in the preop short stay room.  Findings: The urethra was unremarkable, prostatic urethra with moderate BPH and lateral lobes, left stent was noted in the bladder and it appeared clean without encrustation.  Description of procedure: After consent was obtained from patient and daughter, the penis was prepped with Betadine and draped in the usual sterile fashion.  Lidocaine  jelly was instilled per urethra and allowed to dwell for several minutes.  A flexible cystoscope was passed per urethra into the bladder and graspers were advanced.  The stent was grasped and removed intact without difficulty.  There was no resistance.  The stent appeared clean without encrustation.  Patient tolerated the procedure well.  Complications: None Blood loss: None Drains: None Specimens: None  Disposition: Patient will be discharged back to skilled nursing facility.

## 2023-09-27 NOTE — Progress Notes (Signed)
 Surgical procedure turned into a minor room procedure. Performed in short stay with Dr Nieves and Short Stay RN.  Patient tolerated procedure and stent was able to be removed.  Patient was discharged Via wheelchair with daughter and Medical transportation services

## 2023-09-28 DIAGNOSIS — M6281 Muscle weakness (generalized): Secondary | ICD-10-CM | POA: Diagnosis not present

## 2023-09-28 DIAGNOSIS — R1312 Dysphagia, oropharyngeal phase: Secondary | ICD-10-CM | POA: Diagnosis not present

## 2023-09-29 DIAGNOSIS — M6281 Muscle weakness (generalized): Secondary | ICD-10-CM | POA: Diagnosis not present

## 2023-09-29 DIAGNOSIS — R1312 Dysphagia, oropharyngeal phase: Secondary | ICD-10-CM | POA: Diagnosis not present

## 2023-09-30 DIAGNOSIS — M6281 Muscle weakness (generalized): Secondary | ICD-10-CM | POA: Diagnosis not present

## 2023-09-30 DIAGNOSIS — R1312 Dysphagia, oropharyngeal phase: Secondary | ICD-10-CM | POA: Diagnosis not present

## 2023-10-01 DIAGNOSIS — R1312 Dysphagia, oropharyngeal phase: Secondary | ICD-10-CM | POA: Diagnosis not present

## 2023-10-01 DIAGNOSIS — M6281 Muscle weakness (generalized): Secondary | ICD-10-CM | POA: Diagnosis not present

## 2023-10-02 DIAGNOSIS — M6281 Muscle weakness (generalized): Secondary | ICD-10-CM | POA: Diagnosis not present

## 2023-10-02 DIAGNOSIS — J9611 Chronic respiratory failure with hypoxia: Secondary | ICD-10-CM | POA: Diagnosis not present

## 2023-10-02 DIAGNOSIS — R451 Restlessness and agitation: Secondary | ICD-10-CM | POA: Diagnosis not present

## 2023-10-02 DIAGNOSIS — R1312 Dysphagia, oropharyngeal phase: Secondary | ICD-10-CM | POA: Diagnosis not present

## 2023-10-03 DIAGNOSIS — R1312 Dysphagia, oropharyngeal phase: Secondary | ICD-10-CM | POA: Diagnosis not present

## 2023-10-03 DIAGNOSIS — M6281 Muscle weakness (generalized): Secondary | ICD-10-CM | POA: Diagnosis not present

## 2023-10-03 DIAGNOSIS — J9611 Chronic respiratory failure with hypoxia: Secondary | ICD-10-CM | POA: Diagnosis not present

## 2023-10-04 ENCOUNTER — Telehealth: Payer: Self-pay

## 2023-10-04 DIAGNOSIS — R296 Repeated falls: Secondary | ICD-10-CM | POA: Diagnosis not present

## 2023-10-04 DIAGNOSIS — R1312 Dysphagia, oropharyngeal phase: Secondary | ICD-10-CM | POA: Diagnosis not present

## 2023-10-04 DIAGNOSIS — J9611 Chronic respiratory failure with hypoxia: Secondary | ICD-10-CM | POA: Diagnosis not present

## 2023-10-04 DIAGNOSIS — M6281 Muscle weakness (generalized): Secondary | ICD-10-CM | POA: Diagnosis not present

## 2023-10-04 NOTE — Telephone Encounter (Signed)
Phone call made

## 2023-10-04 NOTE — Telephone Encounter (Signed)
 Talked with [name], patients daughter.  Concern for patient having issues with issues with night terrors. Family is not sure if due to Melatonin given or other medications.  Patient is now in nursing home. Family is unsure what to do. If patient will be seen by nursing home physician or make appt. Would like to know if they can be sent to social services for assistance.  Appt is also scheduled with you on sept 4th.

## 2023-10-05 DIAGNOSIS — M6281 Muscle weakness (generalized): Secondary | ICD-10-CM | POA: Diagnosis not present

## 2023-10-05 DIAGNOSIS — R1312 Dysphagia, oropharyngeal phase: Secondary | ICD-10-CM | POA: Diagnosis not present

## 2023-10-07 DIAGNOSIS — R1312 Dysphagia, oropharyngeal phase: Secondary | ICD-10-CM | POA: Diagnosis not present

## 2023-10-07 DIAGNOSIS — J9611 Chronic respiratory failure with hypoxia: Secondary | ICD-10-CM | POA: Diagnosis not present

## 2023-10-07 DIAGNOSIS — M6281 Muscle weakness (generalized): Secondary | ICD-10-CM | POA: Diagnosis not present

## 2023-10-08 DIAGNOSIS — M6281 Muscle weakness (generalized): Secondary | ICD-10-CM | POA: Diagnosis not present

## 2023-10-08 DIAGNOSIS — J9611 Chronic respiratory failure with hypoxia: Secondary | ICD-10-CM | POA: Diagnosis not present

## 2023-10-08 DIAGNOSIS — R1312 Dysphagia, oropharyngeal phase: Secondary | ICD-10-CM | POA: Diagnosis not present

## 2023-10-09 ENCOUNTER — Ambulatory Visit: Payer: Self-pay

## 2023-10-09 DIAGNOSIS — R296 Repeated falls: Secondary | ICD-10-CM | POA: Diagnosis not present

## 2023-10-09 NOTE — Telephone Encounter (Signed)
 FYI Only or Action Required?: Action required by provider: referral request and clinical question for provider.  Patient was last seen in primary care on 06/06/2023 by Alphonsa Glendia LABOR, MD.  Called Nurse Triage reporting No chief complaint on file..  Symptoms began yesterday.  Interventions attempted: Nothing.  Symptoms are: gradually worsening.  Triage Disposition: See PCP When Office is Open (Within 3 Days)  Patient/caregiver understands and will follow disposition?: Yes   Copied from CRM #[id number]. Topic: Clinical - Red Word Triage >> Oct 09, 2023  1:55 PM Gustabo D wrote: Patient took a fall last night and hit his head he's had about 10 falls and his daughter is really worried about him. He's confused. Reason for Disposition  MILD weakness (e.g., does not interfere with ability to work, go to school, normal activities)  (Exception: Mild weakness is a chronic symptom.)  Answer Assessment - Initial Assessment Questions 1. MECHANISM: How did the fall happen?     Weakness  2. DOMESTIC VIOLENCE AND ELDER ABUSE SCREENING: Did you fall because someone pushed you or tried to hurt you? If Yes, ask: Are you safe now?     No  3. ONSET: When did the fall happen? (e.g., minutes, hours, or days ago)     Last fall was today  4. LOCATION: What part of the body hit the ground? (e.g., back, buttocks, head, hips, knees, hands, head, stomach)     Unwitnessed  5. INJURY: Did you hurt (injure) yourself when you fell? If Yes, ask: What did you injure? Tell me more about this? (e.g., body area; type of injury; pain severity)     No  6. PAIN: Is there any pain? If Yes, ask: How bad is the pain? (e.g., Scale 0-10; or none, mild,      Denies  7. SIZE: For cuts, bruises, or swelling, ask: How large is it? (e.g., inches or centimeters)      Denies  9. OTHER SYMPTOMS: Do you have any other symptoms? (e.g., dizziness, fever, weakness; new-onset or worsening).      Weakness,  Confusion  10. CAUSE: What do you think caused the fall (or falling)? (e.g., dizzy spell, tripped)       Rolled out of bed this time  Protocols used: Falls and [facility]

## 2023-10-10 ENCOUNTER — Ambulatory Visit: Admitting: Family Medicine

## 2023-10-10 VITALS — BP 121/70 | HR 73 | Temp 96.8°F | Ht 73.0 in

## 2023-10-10 DIAGNOSIS — E785 Hyperlipidemia, unspecified: Secondary | ICD-10-CM

## 2023-10-10 DIAGNOSIS — R1312 Dysphagia, oropharyngeal phase: Secondary | ICD-10-CM | POA: Diagnosis not present

## 2023-10-10 DIAGNOSIS — Z79899 Other long term (current) drug therapy: Secondary | ICD-10-CM

## 2023-10-10 DIAGNOSIS — M6281 Muscle weakness (generalized): Secondary | ICD-10-CM | POA: Diagnosis not present

## 2023-10-10 DIAGNOSIS — D509 Iron deficiency anemia, unspecified: Secondary | ICD-10-CM

## 2023-10-10 DIAGNOSIS — N1831 Chronic kidney disease, stage 3a: Secondary | ICD-10-CM

## 2023-10-10 DIAGNOSIS — R634 Abnormal weight loss: Secondary | ICD-10-CM | POA: Diagnosis not present

## 2023-10-10 DIAGNOSIS — F0150 Vascular dementia without behavioral disturbance: Secondary | ICD-10-CM

## 2023-10-10 DIAGNOSIS — J9611 Chronic respiratory failure with hypoxia: Secondary | ICD-10-CM | POA: Diagnosis not present

## 2023-10-10 NOTE — Progress Notes (Signed)
 Subjective:    Patient ID: [name], male    DOB: 15-Oct-1940, 83 y.o.   MRN: [id number]  HPI  Urgent referral to neurologist Reporting hallucinations, seeing and talking to people not there Hx of dementia, delirium, beligerent multiple falls x 8  Possibel uti   Hard knot in mid chest not resolving  [name] is an 83 year old male with dementia who presents with cognitive decline and hallucinations. He is accompanied by family members.  He has been experiencing significant cognitive decline and hallucinations, including vivid dreams and seeing people being killed. He believes someone is trying to kill him, often talks to himself, and is disoriented, not recognizing his surroundings or people. His behavior is sometimes aggressive, including cursing at others.  He has a history of recurrent urinary tract infections (UTIs) but currently has no burning, fever, or pain. He has experienced significant weight loss, dropping from 317 pounds to 259 pounds in less than a month. He has been sleeping excessively, which was attributed to being given opiates without family approval. Since moving to [facility] on August 8th, he has shown some improvement with physical and occupational therapy, gaining strength and being able to stand for short periods and feed himself occasionally.  He has had eight falls and struggles with mobility, requiring a full hoist for transfers. He is unable to stand or go to the bathroom independently. He has issues with swallowing, often choking on liquids, and requires a mechanical soft diet with small bites and careful monitoring to prevent aspiration.  He is on four liters of oxygen  and has a BiPAP machine but struggles to keep the mask on due to discomfort and panic. He is a mouth breather, complicating the use of the BiPAP. He experiences thick, off-white mucus in his mouth, which is concerning for potential aspiration.  His knees are sore, and he has bone  spurs, causing discomfort when hoisted or when his feet touch the end of the bed. He is currently on duloxetine  for depression. He is also on isosorbide  mononitrate and carvedilol  for cardiac issues.  He has an enlarged prostate and experiences pain and redness in the genital area, with swollen and red testicles. Past Medical History Copy/Paste - Dementia with behavioral disturbances - Recurrent urinary tract infections - Aspiration pneumonia - Vascular dementia with history of mini strokes - Epididymitis - Enlarged prostate - Oxygen  dependency  Medications Copy/Paste - Carvedilol  - Duloxetine  - Prostatin Review of Systems     Objective:   Physical Exam General-in no acute distress Eyes-no discharge Lungs-respiratory rate normal, CTA CV-no murmurs,RRR Extremities skin warm dry no edema Neuro grossly normal Behavior normal, alert Patient is quite tired Difficult to arouse He is aware of who I am aware he is but not aware of anything else Is not able to carry on a conversation He feels that someone is out to get him or hurt him       Assessment & Plan:  Vascular dementia with behavioral disturbances Cognitive decline with hallucinations and behavioral disturbances exacerbated by recent hospitalization and nursing home placement. Limited efficacy of memory medications noted. - Coordinate with nursing home medical team for neurology referral. - Discuss potential medication adjustments with nurse practitioner. - Emphasize behavioral management strategies for hallucinations.  Dysphagia with risk of aspiration and recurrent aspiration pneumonia Significant risk of aspiration leading to recurrent aspiration pneumonia. Difficulty swallowing and choking on liquids noted. - Monitor for signs of aspiration pneumonia. - Encourage small bites and slow  feeding. - Consider chest x-ray if aspiration pneumonia is suspected.  Severe unintentional weight loss Weight loss from 317 to  259 pounds in less than a month due to cognitive decline, dysphagia, and possible inadequate nutritional intake. Potential underlying malignancy discussed. - Encourage nutritional intake with small, manageable meals.  Oxygen  dependence and obstructive sleep apnea, nonadherent to BiPAP Oxygen  dependence with obstructive sleep apnea. Nonadherence to BiPAP due to discomfort and cognitive decline. Emphasized maintaining oxygen  levels. - Ensure adequate oxygenation. - Discuss BiPAP adherence with nursing home staff.  Chronic urinary retention with bacteriuria Chronic urinary retention with asymptomatic bacteriuria likely due to incomplete bladder emptying. Treatment only if symptomatic. - Avoid antibiotic treatment unless symptomatic.  Epididymitis with scrotal swelling and pain Scrotal swelling and pain with redness and tenderness. Epididymitis and enlarged prostate present.  Chronic knee pain with bone spurs Chronic knee pain exacerbated by hoist use in nursing home. Bone spurs causing discomfort during transfers.  1. Stage 3a chronic kidney disease (CKD) ([name]) (Primary) Stable check lab work await results  2. Iron deficiency anemia, unspecified iron deficiency anemia type Lab work ordered await results - CBC with Differential  3. Hyperlipidemia, unspecified hyperlipidemia type Healthy diet  4. High risk medication use Labs ordered  5. Weight loss Significant weight loss although probably related to his dementia and poor p.o. intake  6. Vascular dementia, unspecified dementia severity, unspecified whether behavioral, psychotic, or mood disturbance or anxiety ([name]) Patient having hallucination also some paranoia Will taper off Cymbalta  to see if that makes a difference I suspect he has multi-infarct dementia or vascular dementia There is also possibility of Lewy body dementia He may well benefit from a antipsychotic low-dose at nighttime to help him with his behaviors if he is  aggressive  - Basic Metabolic Panel - Hepatic Function Panel  We did discuss palliative care as well as hospice I do not feel he is in the category of hospice just yet but it is heading in that direction He gets his care through a rehab facility we will continue that family will send us  contact information to talk with the nurse practitioner Family will send us  contact information we will go from there

## 2023-10-11 ENCOUNTER — Inpatient Hospital Stay (HOSPITAL_COMMUNITY)
Admission: EM | Admit: 2023-10-11 | Discharge: 2023-10-14 | DRG: 643 | Disposition: A | Discharge status: Hospice | Attending: Hospitalist | Admitting: Hospitalist

## 2023-10-11 ENCOUNTER — Encounter (HOSPITAL_COMMUNITY): Payer: Self-pay

## 2023-10-11 ENCOUNTER — Emergency Department (HOSPITAL_COMMUNITY)

## 2023-10-11 ENCOUNTER — Other Ambulatory Visit: Payer: Self-pay

## 2023-10-11 ENCOUNTER — Ambulatory Visit: Payer: Self-pay | Admitting: Family Medicine

## 2023-10-11 ENCOUNTER — Telehealth: Payer: Self-pay | Admitting: Family Medicine

## 2023-10-11 DIAGNOSIS — Z79899 Other long term (current) drug therapy: Secondary | ICD-10-CM

## 2023-10-11 DIAGNOSIS — N390 Urinary tract infection, site not specified: Secondary | ICD-10-CM | POA: Diagnosis present

## 2023-10-11 DIAGNOSIS — N179 Acute kidney failure, unspecified: Secondary | ICD-10-CM | POA: Diagnosis present

## 2023-10-11 DIAGNOSIS — R339 Retention of urine, unspecified: Secondary | ICD-10-CM | POA: Diagnosis not present

## 2023-10-11 DIAGNOSIS — Z85820 Personal history of malignant melanoma of skin: Secondary | ICD-10-CM

## 2023-10-11 DIAGNOSIS — Z9981 Dependence on supplemental oxygen: Secondary | ICD-10-CM

## 2023-10-11 DIAGNOSIS — Z66 Do not resuscitate: Secondary | ICD-10-CM | POA: Diagnosis present

## 2023-10-11 DIAGNOSIS — E210 Primary hyperparathyroidism: Secondary | ICD-10-CM | POA: Diagnosis not present

## 2023-10-11 DIAGNOSIS — I130 Hypertensive heart and chronic kidney disease with heart failure and stage 1 through stage 4 chronic kidney disease, or unspecified chronic kidney disease: Secondary | ICD-10-CM | POA: Diagnosis not present

## 2023-10-11 DIAGNOSIS — R739 Hyperglycemia, unspecified: Secondary | ICD-10-CM | POA: Diagnosis not present

## 2023-10-11 DIAGNOSIS — D631 Anemia in chronic kidney disease: Secondary | ICD-10-CM | POA: Diagnosis not present

## 2023-10-11 DIAGNOSIS — Z974 Presence of external hearing-aid: Secondary | ICD-10-CM

## 2023-10-11 DIAGNOSIS — R0989 Other specified symptoms and signs involving the circulatory and respiratory systems: Secondary | ICD-10-CM | POA: Diagnosis not present

## 2023-10-11 DIAGNOSIS — Z8711 Personal history of peptic ulcer disease: Secondary | ICD-10-CM

## 2023-10-11 DIAGNOSIS — J9611 Chronic respiratory failure with hypoxia: Secondary | ICD-10-CM | POA: Diagnosis not present

## 2023-10-11 DIAGNOSIS — E860 Dehydration: Secondary | ICD-10-CM | POA: Diagnosis present

## 2023-10-11 DIAGNOSIS — N1832 Chronic kidney disease, stage 3b: Secondary | ICD-10-CM | POA: Diagnosis present

## 2023-10-11 DIAGNOSIS — J4489 Other specified chronic obstructive pulmonary disease: Secondary | ICD-10-CM | POA: Diagnosis not present

## 2023-10-11 DIAGNOSIS — R404 Transient alteration of awareness: Secondary | ICD-10-CM | POA: Diagnosis not present

## 2023-10-11 DIAGNOSIS — Z515 Encounter for palliative care: Secondary | ICD-10-CM | POA: Diagnosis not present

## 2023-10-11 DIAGNOSIS — R1310 Dysphagia, unspecified: Secondary | ICD-10-CM | POA: Diagnosis present

## 2023-10-11 DIAGNOSIS — Z87891 Personal history of nicotine dependence: Secondary | ICD-10-CM

## 2023-10-11 DIAGNOSIS — M8580 Other specified disorders of bone density and structure, unspecified site: Secondary | ICD-10-CM | POA: Diagnosis present

## 2023-10-11 DIAGNOSIS — Z9841 Cataract extraction status, right eye: Secondary | ICD-10-CM

## 2023-10-11 DIAGNOSIS — G4733 Obstructive sleep apnea (adult) (pediatric): Secondary | ICD-10-CM | POA: Diagnosis not present

## 2023-10-11 DIAGNOSIS — R1312 Dysphagia, oropharyngeal phase: Secondary | ICD-10-CM | POA: Diagnosis not present

## 2023-10-11 DIAGNOSIS — E785 Hyperlipidemia, unspecified: Secondary | ICD-10-CM | POA: Diagnosis present

## 2023-10-11 DIAGNOSIS — I5032 Chronic diastolic (congestive) heart failure: Secondary | ICD-10-CM | POA: Diagnosis not present

## 2023-10-11 DIAGNOSIS — Z8249 Family history of ischemic heart disease and other diseases of the circulatory system: Secondary | ICD-10-CM

## 2023-10-11 DIAGNOSIS — Z96653 Presence of artificial knee joint, bilateral: Secondary | ICD-10-CM | POA: Diagnosis not present

## 2023-10-11 DIAGNOSIS — E039 Hypothyroidism, unspecified: Secondary | ICD-10-CM | POA: Diagnosis present

## 2023-10-11 DIAGNOSIS — Z6836 Body mass index (BMI) 36.0-36.9, adult: Secondary | ICD-10-CM

## 2023-10-11 DIAGNOSIS — Z9842 Cataract extraction status, left eye: Secondary | ICD-10-CM

## 2023-10-11 DIAGNOSIS — G928 Other toxic encephalopathy: Secondary | ICD-10-CM | POA: Diagnosis not present

## 2023-10-11 DIAGNOSIS — N1831 Chronic kidney disease, stage 3a: Secondary | ICD-10-CM | POA: Diagnosis not present

## 2023-10-11 DIAGNOSIS — F0150 Vascular dementia without behavioral disturbance: Secondary | ICD-10-CM | POA: Diagnosis present

## 2023-10-11 DIAGNOSIS — R451 Restlessness and agitation: Secondary | ICD-10-CM | POA: Diagnosis not present

## 2023-10-11 DIAGNOSIS — K219 Gastro-esophageal reflux disease without esophagitis: Secondary | ICD-10-CM | POA: Diagnosis present

## 2023-10-11 DIAGNOSIS — I2510 Atherosclerotic heart disease of native coronary artery without angina pectoris: Secondary | ICD-10-CM | POA: Diagnosis present

## 2023-10-11 DIAGNOSIS — Z87442 Personal history of urinary calculi: Secondary | ICD-10-CM

## 2023-10-11 DIAGNOSIS — J9811 Atelectasis: Secondary | ICD-10-CM | POA: Diagnosis not present

## 2023-10-11 DIAGNOSIS — M6281 Muscle weakness (generalized): Secondary | ICD-10-CM | POA: Diagnosis not present

## 2023-10-11 DIAGNOSIS — E215 Disorder of parathyroid gland, unspecified: Secondary | ICD-10-CM | POA: Diagnosis present

## 2023-10-11 DIAGNOSIS — Z981 Arthrodesis status: Secondary | ICD-10-CM

## 2023-10-11 DIAGNOSIS — I10 Essential (primary) hypertension: Secondary | ICD-10-CM | POA: Diagnosis present

## 2023-10-11 DIAGNOSIS — Z955 Presence of coronary angioplasty implant and graft: Secondary | ICD-10-CM

## 2023-10-11 DIAGNOSIS — Z7982 Long term (current) use of aspirin: Secondary | ICD-10-CM

## 2023-10-11 DIAGNOSIS — Z833 Family history of diabetes mellitus: Secondary | ICD-10-CM

## 2023-10-11 DIAGNOSIS — R8290 Unspecified abnormal findings in urine: Secondary | ICD-10-CM | POA: Diagnosis not present

## 2023-10-11 DIAGNOSIS — Z860100 Personal history of colon polyps, unspecified: Secondary | ICD-10-CM

## 2023-10-11 DIAGNOSIS — I517 Cardiomegaly: Secondary | ICD-10-CM | POA: Diagnosis not present

## 2023-10-11 DIAGNOSIS — N400 Enlarged prostate without lower urinary tract symptoms: Secondary | ICD-10-CM | POA: Diagnosis present

## 2023-10-11 LAB — CBC WITH DIFFERENTIAL/PLATELET
Abs Immature Granulocytes: 0.05 K/uL (ref 0.00–0.07)
Basophils Absolute: 0.1 x10E3/uL (ref 0.0–0.2)
Basophils Absolute: 0.1 K/uL (ref 0.0–0.1)
Basophils Relative: 1 %
Basos: 1 %
EOS (ABSOLUTE): 0.8 x10E3/uL — ABNORMAL HIGH (ref 0.0–0.4)
Eos: 7 %
Eosinophils Absolute: 0.8 K/uL — ABNORMAL HIGH (ref 0.0–0.5)
Eosinophils Relative: 8 %
HCT: 40.7 % (ref 39.0–52.0)
Hematocrit: 40.5 % (ref 37.5–51.0)
Hemoglobin: 13 g/dL (ref 13.0–17.7)
Hemoglobin: 12.4 g/dL — ABNORMAL LOW (ref 13.0–17.0)
Immature Grans (Abs): 0.1 x10E3/uL (ref 0.0–0.1)
Immature Granulocytes: 1 %
Immature Granulocytes: 1 %
Lymphocytes Absolute: 4.7 x10E3/uL — ABNORMAL HIGH (ref 0.7–3.1)
Lymphocytes Relative: 34 %
Lymphs Abs: 3.3 K/uL (ref 0.7–4.0)
Lymphs: 38 %
MCH: 29.6 pg (ref 26.6–33.0)
MCH: 28.9 pg (ref 26.0–34.0)
MCHC: 32.1 g/dL (ref 31.5–35.7)
MCHC: 30.5 g/dL (ref 30.0–36.0)
MCV: 92 fL (ref 79–97)
MCV: 94.9 fL (ref 80.0–100.0)
Monocytes Absolute: 1.1 x10E3/uL — ABNORMAL HIGH (ref 0.1–0.9)
Monocytes Absolute: 0.8 K/uL (ref 0.1–1.0)
Monocytes Relative: 9 %
Monocytes: 9 %
Neutro Abs: 4.6 K/uL (ref 1.7–7.7)
Neutrophils Absolute: 5.6 x10E3/uL (ref 1.4–7.0)
Neutrophils Relative %: 47 %
Neutrophils: 44 %
Platelets: 320 x10E3/uL (ref 150–450)
Platelets: 287 K/uL (ref 150–400)
RBC: 4.39 x10E6/uL (ref 4.14–5.80)
RBC: 4.29 MIL/uL (ref 4.22–5.81)
RDW: 13.4 % (ref 11.6–15.4)
RDW: 13.3 % (ref 11.5–15.5)
WBC: 12.4 x10E3/uL — ABNORMAL HIGH (ref 3.4–10.8)
WBC: 9.7 K/uL (ref 4.0–10.5)
nRBC: 0 % (ref 0.0–0.2)

## 2023-10-11 LAB — URINALYSIS, W/ REFLEX TO CULTURE (INFECTION SUSPECTED)
Bacteria, UA: NONE SEEN
Bilirubin Urine: NEGATIVE
Glucose, UA: NEGATIVE mg/dL
Hgb urine dipstick: NEGATIVE
Ketones, ur: NEGATIVE mg/dL
Nitrite: NEGATIVE
Protein, ur: 30 mg/dL — AB
Specific Gravity, Urine: 1.012 (ref 1.005–1.030)
pH: 5 (ref 5.0–8.0)

## 2023-10-11 LAB — HEPATIC FUNCTION PANEL
ALT: 16 IU/L (ref 0–44)
AST: 21 IU/L (ref 0–40)
Albumin: 3.5 g/dL — ABNORMAL LOW (ref 3.7–4.7)
Alkaline Phosphatase: 126 IU/L — ABNORMAL HIGH (ref 44–121)
Bilirubin Total: 0.3 mg/dL (ref 0.0–1.2)
Bilirubin, Direct: 0.13 mg/dL (ref 0.00–0.40)
Total Protein: 7.1 g/dL (ref 6.0–8.5)

## 2023-10-11 LAB — COMPREHENSIVE METABOLIC PANEL WITH GFR
ALT: 18 U/L (ref 0–44)
AST: 23 U/L (ref 15–41)
Albumin: 2.7 g/dL — ABNORMAL LOW (ref 3.5–5.0)
Alkaline Phosphatase: 89 U/L (ref 38–126)
Anion gap: 10 (ref 5–15)
BUN: 37 mg/dL — ABNORMAL HIGH (ref 8–23)
CO2: 24 mmol/L (ref 22–32)
Calcium: 13.6 mg/dL (ref 8.9–10.3)
Chloride: 104 mmol/L (ref 98–111)
Creatinine, Ser: 2.33 mg/dL — ABNORMAL HIGH (ref 0.61–1.24)
GFR, Estimated: 27 mL/min — ABNORMAL LOW (ref >60)
Glucose, Bld: 115 mg/dL — ABNORMAL HIGH (ref 70–99)
Potassium: 4.3 mmol/L (ref 3.5–5.1)
Sodium: 138 mmol/L (ref 135–145)
Total Bilirubin: 0.4 mg/dL (ref 0.0–1.2)
Total Protein: 6.8 g/dL (ref 6.5–8.1)

## 2023-10-11 LAB — BASIC METABOLIC PANEL WITH GFR
BUN/Creatinine Ratio: 17 (ref 10–24)
BUN: 42 mg/dL — ABNORMAL HIGH (ref 8–27)
CO2: 24 mmol/L (ref 20–29)
Calcium: 13.4 mg/dL (ref 8.6–10.2)
Chloride: 106 mmol/L (ref 96–106)
Creatinine, Ser: 2.44 mg/dL — ABNORMAL HIGH (ref 0.76–1.27)
Glucose: 112 mg/dL — ABNORMAL HIGH (ref 70–99)
Potassium: 5.3 mmol/L — ABNORMAL HIGH (ref 3.5–5.2)
Sodium: 144 mmol/L (ref 134–144)
eGFR: 25 mL/min/1.73 — ABNORMAL LOW (ref >59)

## 2023-10-11 LAB — LIPASE, BLOOD: Lipase: 36 U/L (ref 11–51)

## 2023-10-11 LAB — MAGNESIUM: Magnesium: 2.1 mg/dL (ref 1.7–2.4)

## 2023-10-11 LAB — PHOSPHORUS: Phosphorus: 3 mg/dL (ref 2.5–4.6)

## 2023-10-11 MED ORDER — SODIUM CHLORIDE 0.9 % IV SOLN: INTRAVENOUS | Status: DC

## 2023-10-11 MED ORDER — TAMSULOSIN HCL 0.4 MG PO CAPS: 0.4000 mg | ORAL_CAPSULE | Freq: Every day | ORAL | Status: DC

## 2023-10-11 MED ORDER — HALOPERIDOL LACTATE 5 MG/ML IJ SOLN
5.0000 mg | Freq: Once | INTRAMUSCULAR | Status: DC
  Filled 2023-10-11: qty 1

## 2023-10-11 MED ORDER — SODIUM CHLORIDE 0.9 % IV SOLN
1.0000 g | Freq: Every day | INTRAVENOUS | Status: DC
  Administered 2023-10-11: 1 g via INTRAVENOUS
  Filled 2023-10-11: qty 10

## 2023-10-11 MED ORDER — HEPARIN SODIUM (PORCINE) 5000 UNIT/ML IJ SOLN
5000.0000 [IU] | Freq: Three times a day (TID) | INTRAMUSCULAR | Status: DC
  Administered 2023-10-11 – 2023-10-13 (×6): 5000 [IU] via SUBCUTANEOUS
  Filled 2023-10-11 (×6): qty 1

## 2023-10-11 MED ORDER — SODIUM CHLORIDE 0.9 % IV BOLUS
1000.0000 mL | Freq: Once | INTRAVENOUS | Status: AC
  Administered 2023-10-11: 1000 mL via INTRAVENOUS

## 2023-10-11 MED ORDER — SODIUM CHLORIDE 0.9% FLUSH
3.0000 mL | Freq: Two times a day (BID) | INTRAVENOUS | Status: DC
  Administered 2023-10-11 – 2023-10-13 (×4): 3 mL via INTRAVENOUS

## 2023-10-11 MED ORDER — PRAVASTATIN SODIUM 40 MG PO TABS: 40.0000 mg | ORAL_TABLET | Freq: Every evening | ORAL | Status: DC

## 2023-10-11 MED ORDER — CARVEDILOL 3.125 MG PO TABS
6.2500 mg | ORAL_TABLET | Freq: Two times a day (BID) | ORAL | Status: DC
  Administered 2023-10-12 – 2023-10-13 (×3): 6.25 mg via ORAL
  Filled 2023-10-11 (×3): qty 2

## 2023-10-11 MED ORDER — CALCITONIN (SALMON) 200 UNIT/ML IJ SOLN
400.0000 [IU] | Freq: Two times a day (BID) | INTRAMUSCULAR | Status: DC
  Administered 2023-10-11 – 2023-10-12 (×3): 400 [IU] via INTRAMUSCULAR
  Filled 2023-10-11 (×5): qty 2

## 2023-10-11 MED ORDER — PROSOURCE PLUS PO LIQD
30.0000 mL | Freq: Every day | ORAL | Status: DC
  Administered 2023-10-11 – 2023-10-13 (×3): 30 mL via ORAL
  Filled 2023-10-11 (×3): qty 30

## 2023-10-11 MED ORDER — ACETAMINOPHEN 650 MG RE SUPP
650.0000 mg | Freq: Four times a day (QID) | RECTAL | Status: DC | PRN
  Administered 2023-10-13: 650 mg via RECTAL
  Filled 2023-10-11: qty 1

## 2023-10-11 MED ORDER — ACETAMINOPHEN 325 MG PO TABS: 650.0000 mg | ORAL_TABLET | Freq: Four times a day (QID) | ORAL | Status: DC | PRN

## 2023-10-11 MED ORDER — ASPIRIN 81 MG PO TBEC
81.0000 mg | DELAYED_RELEASE_TABLET | Freq: Every day | ORAL | Status: DC
  Administered 2023-10-12 – 2023-10-13 (×2): 81 mg via ORAL
  Filled 2023-10-11 (×2): qty 1

## 2023-10-11 MED ORDER — HALOPERIDOL LACTATE 5 MG/ML IJ SOLN
1.0000 mg | Freq: Three times a day (TID) | INTRAMUSCULAR | Status: DC | PRN
  Administered 2023-10-11 – 2023-10-12 (×2): 1 mg via INTRAMUSCULAR
  Filled 2023-10-11 (×2): qty 1

## 2023-10-11 NOTE — Telephone Encounter (Signed)
 Critical labs came back His facility was called [facility] we will fax the information there of his labs and office visit [facility] Attn:NP/ medical team [phone]

## 2023-10-11 NOTE — ED Notes (Signed)
 Patient given a sip of water after yelling for some, however patient then had a bit of a nasty cough after finishing drinking. Patient NPO for the time being per MD.

## 2023-10-11 NOTE — ED Provider Notes (Signed)
 BLADDER CATHETERIZATION  Date/Time: 10/11/2023 11:42 AM  Performed by: Francis Ileana SAILOR, PA-C Authorized by: Francis Ileana SAILOR, PA-C   Consent:    Consent obtained:  Verbal   Consent given by:  Guardian   Risks discussed:  Urethral injury, incomplete procedure and pain   Alternatives discussed:  No treatment Universal protocol:    Patient identity confirmed:  Arm band Anesthesia:    Anesthesia method:  None Procedure details:    Provider performed due to:  Complicated insertion and nurse unable to complete   Catheter insertion:  Indwelling   Catheter type:  Coude   Catheter size:  16 Fr   Bladder irrigation: no     Number of attempts:  1   Urine characteristics:  Clear Post-procedure details:    Procedure completion:  Tolerated with difficulty       Francis Ileana SAILOR, PA-C 10/11/23 1144    Yolande Lamar BROCKS, MD 10/11/23 [phone]

## 2023-10-11 NOTE — Plan of Care (Signed)

## 2023-10-11 NOTE — Telephone Encounter (Signed)
 Patient's recent office notes and labs have been sent over to [facility] in [place].

## 2023-10-11 NOTE — Progress Notes (Signed)
 Patient who was received from outgoing staff alert but confused and disoriented on oxygen  at 4 liters via nasal cannula. Patient had presented with concerns of abnormal labs and hypercalcemia of 13.6. Patient being managed for hypercalcemia secondary to primary hyperparathyroidism,  urinary tract infection, and  AKI on CKD stage IIIa. Patient is agitated and restless, he has mitts on due to pulling on lines and equipments. Patient family by bedside.  Patient has an indwelling foley catheter that is draining well, secured to thigh with a foley stat lock.  Patient has a past medical history of  CAD, HFpEF, COPD on 4 L O2, OSA on CPAP, ulcerative colitis, ankylosing spondylitis, hypertension, hyperlipidemia, CKD 3A, primary hyperparathyroidism, morbid obesity, mood disorder, and dementia. Patient is bed bound and is for two hourly turning. Patient care to continue per plan and recommendations.

## 2023-10-11 NOTE — ED Provider Notes (Signed)
 [place] EMERGENCY DEPARTMENT AT [facility] Provider Note   CSN: [id number] Arrival date & time: 10/11/23  1017     Patient presents with: Abnormal Labs   [name] is a 83 y.o. male.   83 yo M with hx of vascular dementia, CKD, and urinary retention who presents with abnormal creatinine and calcium. Had routine blood work yesterday that showed worsening renal function and elevated calcium. Per [facility] staff has been at his mental baseline. No recent infections that they are aware of. Per pcp note has also had significant weight loss over the past few months.  This is attributed to his dysphagia and cognitive decline but they are saying that he could potentially have underlying malignancy.  Did have some discussions about palliative care and hospice but is not yet enrolled in hospice.       Prior to Admission medications   Medication Sig Start Date End Date Taking? Authorizing Provider  Amino Acids-Protein Hydrolys (FEEDING SUPPLEMENT, PRO-STAT 64,) LIQD Take 30 mLs by mouth in the morning and at bedtime.   Yes [provider]  ammonium lactate  (AMLACTIN DAILY) 12 % lotion Apply 1 Application topically as needed for dry skin. 02/28/23  Yes Semon, Jake L, DPM  aspirin  EC 81 MG tablet Take 81 mg by mouth daily.   Yes [provider]  bisacodyl (DULCOLAX) 10 MG suppository Place 10 mg rectally as needed for moderate constipation.   Yes [provider]  carvedilol  (COREG ) 6.25 MG tablet Take 1 tablet (6.25 mg total) by mouth 2 (two) times daily with a meal. 09/20/23  Yes Luking, Glendia LABOR, MD  DULoxetine  (CYMBALTA ) 60 MG capsule TAKE 1 CAPSULE BY MOUTH DAILY 08/14/23  Yes Luking, Glendia LABOR, MD  hydrocortisone  (ANUSOL -HC) 2.5 % rectal cream Place 1 Application rectally 2 (two) times daily as needed for hemorrhoids.   Yes [provider]  hydrOXYzine (ATARAX) 25 MG tablet Take 12.5 mg by mouth every 6 (six) hours as needed for anxiety.   Yes  [provider]  isosorbide  mononitrate (IMDUR ) 60 MG 24 hr tablet Take 60 mg by mouth daily.   Yes [provider]  lidocaine  (LIDODERM ) 5 % Place 1 patch onto the skin daily. 10/01/23  Yes [provider]  Menthol-Zinc  Oxide (CALMOSEPTINE) 0.44-20.6 % OINT Apply 1 application  topically See admin instructions. Apply to anal area/buttocks three times daily, once per shift.  May also apply every 4 hours as needed for management of moisture associated skin damage.   Yes [provider]  Multiple Vitamin (MULTIVITAMIN PO) Take 1 tablet by mouth daily.   Yes [provider]  OXYGEN  Inhale 4 L/min into the lungs continuous.   Yes [provider]  SODIUM PHOSPHATES RE Place 1 suppository rectally daily as needed (constipation).   Yes [provider]  acetaminophen  (TYLENOL ) 500 MG tablet Take 2 tablets (1,000 mg total) by mouth every 6 (six) hours as needed for headache (pain). 11/24/20   Alphonsa Glendia LABOR, MD  ascorbic acid (VITAMIN C) 500 MG tablet Take 500 mg by mouth 2 (two) times daily.    [provider]  clobetasol (TEMOVATE) 0.05 % external solution Apply 1 Application topically daily as needed (Dermatitis). 06/20/23   [provider]  nitroGLYCERIN  (NITROSTAT ) 0.4 MG SL tablet Place 1 tablet (0.4 mg total) under the tongue every 5 (five) minutes as needed for chest pain. Patient taking differently: Place 0.4 mg under the tongue every 5 (five) minutes x  3 doses as needed for chest pain. 02/26/23   Alphonsa Glendia LABOR, MD  nystatin  (MYCOSTATIN ) 100000 UNIT/ML suspension Use as directed 5 mLs (500,000 Units total) in the mouth or throat 4 (four) times daily -  before meals and at bedtime. Patient not taking: Reported on 09/17/2023 08/21/23   Laurence Locus, DO  phenylephrine  (,USE FOR PREPARATION-H,) 0.25 % suppository Place 1 suppository rectally 2 (two) times daily as needed for hemorrhoids. 08/21/23   Laurence Locus, DO  pravastatin   (PRAVACHOL ) 40 MG tablet 1 qd Patient taking differently: Take 40 mg by mouth every evening. 06/06/23   Alphonsa Glendia LABOR, MD  spironolactone  (ALDACTONE ) 25 MG tablet Take 0.5 tablets (12.5 mg total) by mouth daily. 06/06/23   Alphonsa Glendia LABOR, MD  tamsulosin  (FLOMAX ) 0.4 MG CAPS capsule Take 1 capsule (0.4 mg total) by mouth daily. 06/06/23   Alphonsa Glendia LABOR, MD  UNABLE TO FIND Inhale 1 Device into the lungs at bedtime. Apply CPAP mask at 2100 to use overnight, remove at 0600.    [provider]  Vitamin D , Ergocalciferol , (DRISDOL ) 1.25 MG (50000 UNIT) CAPS capsule TAKE 1 CAPSULE BY MOUTH EVERY 7 DAYS 08/15/23   Alphonsa Glendia LABOR, MD  zinc  sulfate, 50mg  elemental zinc , 220 (50 Zn) MG capsule Take 220 mg by mouth daily.    [provider]    Allergies: Patient has no known allergies.    Review of Systems  Updated Vital Signs BP 109/77 (BP Location: Right Arm)   Pulse 77   Temp 97.7 F (36.5 C) (Axillary)   Resp 20   Ht 6' 1 (1.854 m)   Wt 124.3 kg   SpO2 96%   BMI 36.15 kg/m   Physical Exam Constitutional:      Appearance: Normal appearance.     Comments: Agitated occasionally swinging at staff.  HENT:     Head: Normocephalic and atraumatic.  Eyes:     Extraocular Movements: Extraocular movements intact.     Conjunctiva/sclera: Conjunctivae normal.     Pupils: Pupils are equal, round, and reactive to light.     Comments: Pupils 2 mm BL  Cardiovascular:     Rate and Rhythm: Normal rate and regular rhythm.     Pulses: Normal pulses.     Heart sounds: Normal heart sounds.  Pulmonary:     Effort: Pulmonary effort is normal.     Breath sounds: Normal breath sounds.  Abdominal:     General: There is no distension.     Palpations: There is no mass.     Tenderness: There is no abdominal tenderness. There is no guarding.  Genitourinary:    Testes: Normal.     Comments: Balanitis noted. Musculoskeletal:     Right lower leg: No edema.     Left lower leg: No edema.   Skin:    Capillary Refill: Capillary refill takes more than 3 seconds.  Neurological:     Mental Status: He is alert.     Motor: No weakness.     Comments: No gross cranial nerve deficits and appreciated     (all labs ordered are listed, but only abnormal results are displayed) Labs Reviewed  COMPREHENSIVE METABOLIC PANEL WITH GFR - Abnormal; Notable for the following components:      Result Value   Glucose, Bld 115 (*)    BUN 37 (*)    Creatinine, Ser 2.33 (*)    Calcium 13.6 (*)    Albumin  2.7 (*)    GFR,  Estimated 27 (*)    All other components within normal limits  CBC WITH DIFFERENTIAL/PLATELET - Abnormal; Notable for the following components:   Hemoglobin 12.4 (*)    Eosinophils Absolute 0.8 (*)    All other components within normal limits  URINALYSIS, W/ REFLEX TO CULTURE (INFECTION SUSPECTED) - Abnormal; Notable for the following components:   Protein, ur 30 (*)    Leukocytes,Ua TRACE (*)    All other components within normal limits  MAGNESIUM   PHOSPHORUS  LIPASE, BLOOD  PARATHYROID  HORMONE, INTACT (NO CA)  PTH-RELATED PEPTIDE  CALCIUM, IONIZED  COMPREHENSIVE METABOLIC PANEL WITH GFR  CBC  MAGNESIUM   PHOSPHORUS    EKG: EKG Interpretation Date/Time:  Friday October 11 2023 10:37:44 EDT Ventricular Rate:  79 PR Interval:  304 QRS Duration:  96 QT Interval:  365 QTC Calculation: 419 R Axis:   -57  Text Interpretation: Sinus rhythm Prolonged PR interval Inferior infarct, old Lateral leads are also involved Confirmed by Yolande Charleston [phone]) on 10/11/2023 11:02:17 AM  Radiology: DG Chest Portable 1 View Result Date: 10/11/2023 CLINICAL DATA:  Hyperglycemia. EXAM: PORTABLE CHEST 1 VIEW COMPARISON:  August 14, 2023 FINDINGS: The cardiac silhouette is enlarged and unchanged in size. Marked severity calcification of the aortic arch is seen. Low lung volumes are noted. There is mild prominence of the pulmonary vasculature. Very mild atelectatic changes are  suspected within the right infrahilar region and left lung base. No pleural effusion or pneumothorax is identified. Multilevel degenerative changes are seen throughout the thoracic spine. IMPRESSION: 1. Stable cardiomegaly with mild pulmonary vascular congestion. 2. Very mild right infrahilar and left basilar atelectasis. Electronically Signed   By: Suzen Dials M.D.   On: 10/11/2023 11:47     Procedures   Medications Ordered in the ED  haloperidol  lactate (HALDOL ) injection 5 mg (5 mg Intramuscular Not Given 10/11/23 1119)  0.9 %  sodium chloride  infusion ( Intravenous New Bag/Given 10/11/23 1530)  aspirin  EC tablet 81 mg (81 mg Oral Not Given 10/11/23 1605)  carvedilol  (COREG ) tablet 6.25 mg (has no administration in time range)  pravastatin  (PRAVACHOL ) tablet 40 mg (has no administration in time range)  tamsulosin  (FLOMAX ) capsule 0.4 mg (0.4 mg Oral Not Given 10/11/23 1605)  (feeding supplement) PROSource Plus liquid 30 mL (30 mLs Oral Given 10/11/23 1558)  calcitonin (MIACALCIN ) injection 400 Units (400 Units Intramuscular Not Given 10/11/23 1605)  cefTRIAXone  (ROCEPHIN ) 1 g in sodium chloride  0.9 % 100 mL IVPB (1 g Intravenous New Bag/Given 10/11/23 1603)  heparin  injection 5,000 Units (5,000 Units Subcutaneous Given 10/11/23 1559)  sodium chloride  flush (NS) 0.9 % injection 3 mL (3 mLs Intravenous Given 10/11/23 1550)  acetaminophen  (TYLENOL ) tablet 650 mg (has no administration in time range)    Or  acetaminophen  (TYLENOL ) suppository 650 mg (has no administration in time range)  sodium chloride  0.9 % bolus 1,000 mL (1,000 mLs Intravenous New Bag/Given 10/11/23 1215)    Clinical Course as of 10/11/23 1713  Fri Oct 11, 2023  1232 DNR/DNI confirmed with daughters.  [RP]  1350 Discussed with Dr Tobie from hospitalist [RP]    Clinical Course User Index [RP] Yolande Charleston BROCKS, MD                                 Medical Decision Making Amount and/or Complexity of Data Reviewed Labs:  ordered. Radiology: ordered.  Risk Prescription drug management. Decision regarding hospitalization.   83 year old  male with history of vascular dementia, CKD, and urinary retention who presents to the emergency department with elevated creatinine and calcium  Initial Ddx:  AKI, dehydration, urinary retention, hypercalcemia, malignancy, hyperparathyroidism  MDM/Course:  Patient presents emergency department with abnormal lab work.  It appears that his creatinine is increased to 2.4 from 1.5.  Has had some elevated calcium levels around 11 but most recent blood work showed that it was 13.4.  On arrival was quite agitated and very difficult to get history from.  Did call his facility reports no recent changes in status.  Lab work shows he does have an AKI.  He is retaining urine so Foley catheter was placed.  His calcium was 13.6.  With IV fluids for this.  Did also send off additional blood work including parathyroid  hormone to try to assess for the cause of his hypercalcemia.  Upon re-evaluation he is calmer after receiving some Haldol .  Admitted to hospitalist for further management.  Family is requesting neuro consultation if possible for diagnosis of dementia.    This patient presents to the ED for concern of complaints listed in HPI, this involves an extensive number of treatment options, and is a complaint that carries with it a high risk of complications and morbidity. Disposition including potential need for admission considered.   Dispo: Admit to Floor  Additional history obtained from daughter Records reviewed Outpatient Clinic Notes The following labs were independently interpreted: Chemistry and show AKI and hypercalcemia I independently reviewed the following imaging with scope of interpretation limited to determining acute life threatening conditions related to emergency care: Chest x-ray and agree with the radiologist interpretation with the following exceptions: none I  personally reviewed and interpreted cardiac monitoring: normal sinus rhythm  I personally reviewed and interpreted the pt's EKG: see above for interpretation  I have reviewed the patients home medications and made adjustments as needed Consults: Hospitalist Social Determinants of health:  Geriatric  Portions of this note were generated with Scientist, clinical (histocompatibility and immunogenetics). Dictation errors may occur despite best attempts at proofreading.     Final diagnoses:  Hypercalcemia  AKI (acute kidney injury) [facility])  Agitation  Urinary retention    ED Discharge Orders     None          Yolande Lamar BROCKS, MD 10/11/23 [phone]

## 2023-10-11 NOTE — H&P (Signed)
 History and Physical    Patient: [name] FMW:[id number] DOB: [birth year] DOA: 10/11/2023 DOS: the patient was seen and examined on 10/11/2023 . PCP: Alphonsa Glendia LABOR, MD  Patient coming from: Home Chief complaint: Chief Complaint  Patient presents with   Abnormal Labs   HPI:  [name] is a 83 y.o. male with past medical history  of  CAD, HFpEF, COPD on 4 L O2, OSA on CPAP, ulcerative colitis, ankylosing spondylitis, hypertension, hyperlipidemia, CKD 3A, primary hyperparathyroidism, morbid obesity, mood disorder, dementia, coming for abnormal labs and hypercalcemia of 13.6,    ED Course:  Vital signs in the ED were notable for the following:  Vitals:   10/11/23 1230 10/11/23 1245 10/11/23 1300 10/11/23 1315  BP:  (!) 123/54 115/63 133/62  Pulse: 65 61 67 70  Temp:      Resp: (!) 24 16 19 17   SpO2: 96% 98% 98% 97%  TempSrc:       >>ED evaluation thus far shows: CMP shows glucose of 115 BUN of 37 AKI on CKD stage 3a with creatinine of 2.33 and calcium of 13.6, and cbc is normal.  CBC is normal. Urinalysis with large leucocytes and >50 wbc , Small hb.    >>While in the ED patient received the following: Medications  haloperidol  lactate (HALDOL ) injection 5 mg (5 mg Intramuscular Not Given 10/11/23 1119)  sodium chloride  0.9 % bolus 1,000 mL (1,000 mLs Intravenous New Bag/Given 10/11/23 1215)   Review of Systems  Unable to perform ROS: Age   Past Medical History:  Diagnosis Date   Ankylosing spondylitis ([name]) 11/07/2018   Asthma    BPH (benign prostatic hyperplasia)    CAD (coronary artery disease) 01/1995   Candidal stomatitis    CHF (congestive heart failure) ([name])    Chronic respiratory failure with hypoxia ([name])    CKD (chronic kidney disease)    Clostridium difficile colitis 04/2005   Colitis 2011   COPD (chronic obstructive pulmonary disease) ([name])    Depression    Diaphragmatic hernia    Diverticulosis    DJD (degenerative joint disease)     Elevated white blood cell count    Erectile dysfunction    Fracture, Colles, left, closed 11/29/2016   Gastric ulcer 04/17/2010   Three 5mm gastric ulcers, H.pylori serologies were negative   GERD (gastroesophageal reflux disease)    History of gallstones    History of hiatal hernia    History of kidney stones    History of pneumonia    History of pressure ulcer    Sacral region   History of UTI    Hyperlipidemia    Hyperparathyroidism ([name])    Hypertension    Idiopathic chronic inflammatory bowel disease 05/18/2010   left-sided UC   Iron deficiency anemia    Malignant melanoma ([name])    Skin   Morbid obesity ([name]) 03/12/2018   Obstructive sleep apnea    on Cpap   Osteopenia    Pre-diabetes    S/P endoscopy 07/24/2010   retained gastric contents, benign bx   Status post cervical spinal fusion 07/19/2020   Ulcerative colitis ([name])    Vitamin D  deficiency    Wears hearing aid in both ears    Past Surgical History:  Procedure Laterality Date   ANORECTAL MANOMETRY  2016   [facility]: concern for possible fissure. Noted pelvic floor dyssnyergy   BIOPSY  07/29/2017   Procedure: BIOPSY;  Surgeon: Shaaron Lamar HERO, MD;  Location: [facility];  Service: Endoscopy;;  ascending and sigmoid colon   CARDIAC CATHETERIZATION     with stent   CARDIOVASCULAR STRESS TEST  07/21/2009   No scintigraphic evidence of inducible myocardial ischemia   CARPAL TUNNEL RELEASE Left 01/17/2017   Procedure: LEFT CARPAL TUNNEL RELEASE;  Surgeon: Margrette Taft BRAVO, MD;  Location: [facility];  Service: Orthopedics;  Laterality: Left;   CATARACT EXTRACTION, BILATERAL Bilateral    CERVICAL SPINE SURGERY     C4-5   COLONOSCOPY  04/2005   granularity and friability erosions from rectum to 40cm. Bx infection vs IBD. C. Diff positive at the time.    COLONOSCOPY  05/2010   [facility]: left-sided UC, bx with no dysplasia, shallow diverticula   COLONOSCOPY [name] 11/07/2012   MFM:[id number] proctocolitis status post  segmental biopsy/Sigmoid colon polyps removed as described above. Procedure compromisd by technical difficulties. bx: Inflammation limited to sigmoid and rectum on pathology.   COLONOSCOPY  04/2014   Dr[facility] at [facility]: well localized proctocolitis limited to sigmoid   COLONOSCOPY WITH PROPOFOL  [name] 07/29/2017   diverticulosis in colon, three 4-6 mm polyps at splenic flexure and in cecum, one 10 mm polyp in rectum, abnormal rectum and sigmoid consistent with active UC   CORONARY STENT PLACEMENT  01/1995   CYSTOSCOPY/URETEROSCOPY/HOLMIUM LASER/STENT PLACEMENT Left 08/16/2023   Procedure: CYSTOSCOPY, URETEROSCOPY, LASER LITHOTRIPSY, STENT PLACEMENT, RETROGRADE PYLOGRAM;  Surgeon: Francisca Redell BROCKS, MD;  Location: [facility] OR;  Service: Urology;  Laterality: Left;   ESOPHAGEAL DILATION  01/20/2022   Procedure: ESOPHAGEAL DILATION;  Surgeon: Elicia Claw, MD;  Location: [facility];  Service: Gastroenterology;;   ESOPHAGOGASTRODUODENOSCOPY  07/24/2010   MFM:[id number] esophagus   ESOPHAGOGASTRODUODENOSCOPY  04/2014   Dr. Landy at [facility]: negative small bowel biopsies   ESOPHAGOGASTRODUODENOSCOPY (EGD) WITH PROPOFOL  [name] 01/20/2022   Procedure: ESOPHAGOGASTRODUODENOSCOPY (EGD) WITH PROPOFOL ;  Surgeon: Elicia Claw, MD;  Location: [facility];  Service: Gastroenterology;  Laterality: [name];   FOOT SURGERY Bilateral two   KNEE SURGERY Bilateral two   Bilateral Knee replacements   POLYPECTOMY  07/29/2017   Procedure: POLYPECTOMY;  Surgeon: Shaaron Lamar HERO, MD;  Location: [facility];  Service: Endoscopy;;  splenic flexure, ascending colon polyp;rectal   SHOULDER SURGERY  two   SKIN CANCER EXCISION     TONSILLECTOMY     TRANSTHORACIC ECHOCARDIOGRAM  03/23/2009   EF 60-65%, normal LV systolic function   ULNAR HEAD EXCISION Left 01/17/2017   Procedure: LEFT ULNAR HEAD RESECTION;  Surgeon: Margrette Taft BRAVO, MD;  Location: [facility];  Service: Orthopedics;  Laterality: Left;    reports that he quit  smoking about 54 years ago. His smoking use included cigarettes. He started smoking about 74 years ago. He has a 20 pack-year smoking history. He has been exposed to tobacco smoke. He has never used smokeless tobacco. He reports that he does not drink alcohol and does not use drugs. No Known Allergies Family History  Problem Relation Age of Onset   Lung cancer Mother    Cancer Mother        breast   Diabetes Mother    Stroke Father    Hypertension Father    Hyperlipidemia Father    Kidney failure Brother    Other Child        blood infection   Colon cancer Neg Hx    Prior to Admission medications   Medication Sig Start Date End Date Taking? Authorizing Provider  acetaminophen  (TYLENOL ) 500 MG tablet Take 2 tablets (1,000 mg total) by mouth every  6 (six) hours as needed for headache (pain). 11/24/20   Alphonsa Glendia LABOR, MD  albuterol  (PROVENTIL ) (2.5 MG/3ML) 0.083% nebulizer solution Take 3 mLs (2.5 mg total) by nebulization every 4 (four) hours as needed for wheezing or shortness of breath. Patient not taking: Reported on 09/17/2023 08/21/23   Laurence Locus, DO  Amino Acids-Protein Hydrolys (FEEDING SUPPLEMENT, PRO-STAT 64,) LIQD Take 30 mLs by mouth daily.    [provider]  ammonium lactate  (AMLACTIN DAILY) 12 % lotion Apply 1 Application topically as needed for dry skin. 02/28/23   Lamount Ethan CROME, DPM  ascorbic acid (VITAMIN C) 500 MG tablet Take 500 mg by mouth 2 (two) times daily.    [provider]  aspirin  EC 81 MG tablet Take 81 mg by mouth daily.    [provider]  carvedilol  (COREG ) 6.25 MG tablet Take 1 tablet (6.25 mg total) by mouth 2 (two) times daily with a meal. 09/20/23   Luking, Glendia LABOR, MD  clobetasol (TEMOVATE) 0.05 % external solution Apply 1 Application topically daily as needed (Dermatitis). 06/20/23   [provider]  DULoxetine  (CYMBALTA ) 60 MG capsule TAKE 1 CAPSULE BY MOUTH DAILY 08/14/23   Alphonsa Glendia LABOR, MD  hydrocortisone  (ANUSOL -HC)  2.5 % rectal cream Place 1 Application rectally 2 (two) times daily as needed for hemorrhoids.    [provider]  isosorbide  mononitrate (IMDUR ) 60 MG 24 hr tablet Take 60 mg by mouth daily.    [provider]  lidocaine  4 % Place 1 patch onto the skin daily. Apply to the back of neck    [provider]  Menthol-Zinc  Oxide (CALMOSEPTINE) 0.44-20.6 % OINT Apply 1 application  topically See admin instructions. Apply to anal area/buttocks three times daily, once per shift.  May also apply every 4 hours as needed for management of moisture associated skin damage.    [provider]  Multiple Vitamin (MULTIVITAMIN PO) Take 1 tablet by mouth daily.    [provider]  nitroGLYCERIN  (NITROSTAT ) 0.4 MG SL tablet Place 1 tablet (0.4 mg total) under the tongue every 5 (five) minutes as needed for chest pain. Patient taking differently: Place 0.4 mg under the tongue every 5 (five) minutes x 3 doses as needed for chest pain. 02/26/23   Alphonsa Glendia LABOR, MD  nystatin  (MYCOSTATIN ) 100000 UNIT/ML suspension Use as directed 5 mLs (500,000 Units total) in the mouth or throat 4 (four) times daily -  before meals and at bedtime. Patient not taking: Reported on 09/17/2023 08/21/23   Laurence Locus, DO  OXYGEN  Inhale 4 L/min into the lungs continuous.    [provider]  phenylephrine  (,USE FOR PREPARATION-H,) 0.25 % suppository Place 1 suppository rectally 2 (two) times daily as needed for hemorrhoids. 08/21/23   Laurence Locus, DO  pravastatin  (PRAVACHOL ) 40 MG tablet 1 qd Patient taking differently: Take 40 mg by mouth every evening. 06/06/23   Alphonsa Glendia LABOR, MD  spironolactone  (ALDACTONE ) 25 MG tablet Take 0.5 tablets (12.5 mg total) by mouth daily. 06/06/23   Alphonsa Glendia LABOR, MD  tamsulosin  (FLOMAX ) 0.4 MG CAPS capsule Take 1 capsule (0.4 mg total) by mouth daily. 06/06/23   Alphonsa Glendia LABOR, MD  UNABLE TO FIND Inhale 1 Device into the lungs at bedtime. Apply CPAP mask at 2100 to  use overnight, remove at 0600.    [provider]  Vitamin D , Ergocalciferol , (DRISDOL ) 1.25 MG (50000 UNIT) CAPS capsule TAKE 1 CAPSULE BY MOUTH EVERY 7 DAYS 08/15/23  Alphonsa Glendia LABOR, MD  zinc  sulfate, 50mg  elemental zinc , 220 (50 Zn) MG capsule Take 220 mg by mouth daily.    [provider]                                                                                 Vitals:   10/11/23 1230 10/11/23 1245 10/11/23 1300 10/11/23 1315  BP:  (!) 123/54 115/63 133/62  Pulse: 65 61 67 70  Resp: (!) 24 16 19 17   Temp:      TempSrc:      SpO2: 96% 98% 98% 97%   Physical Exam Vitals reviewed.  Constitutional:      General: He is not in acute distress.    Appearance: He is not ill-appearing.  HENT:     Head: Normocephalic.  Eyes:     Extraocular Movements: Extraocular movements intact.  Cardiovascular:     Rate and Rhythm: Normal rate and regular rhythm.     Pulses: Normal pulses.     Heart sounds: Normal heart sounds.  Pulmonary:     Effort: Pulmonary effort is normal.     Breath sounds: Normal breath sounds.  Abdominal:     General: There is no distension.     Palpations: Abdomen is soft.     Tenderness: There is no abdominal tenderness.  Neurological:     General: No focal deficit present.     Mental Status: He is alert. He is disoriented.     Labs on Admission: I have personally reviewed following labs and imaging studies CBC: Recent Labs  Lab 10/10/23 1634 10/11/23 1036  WBC 12.4* 9.7  NEUTROABS 5.6 4.6  HGB 13.0 12.4*  HCT 40.5 40.7  MCV 92 94.9  PLT 320 287   Basic Metabolic Panel: Recent Labs  Lab 10/10/23 1634 10/11/23 1036  NA 144 138  K 5.3* 4.3  CL 106 104  CO2 24 24  GLUCOSE 112* 115*  BUN 42* 37*  CREATININE 2.44* 2.33*  CALCIUM 13.4* 13.6*  MG  --  2.1  PHOS  --  3.0   GFR: CrCl cannot be calculated (Unknown ideal weight.). Liver Function Tests: Recent Labs  Lab 10/10/23 1634 10/11/23 1036  AST 21 23  ALT 16 18   ALKPHOS 126* 89  BILITOT 0.3 0.4  PROT 7.1 6.8  ALBUMIN  3.5* 2.7*   Recent Labs  Lab 10/11/23 1036  LIPASE 36   CrCl cannot be calculated (Unknown ideal weight.).   Recent Labs    12/04/22 1005 12/06/22 0828 12/21/22 1541 06/06/23 1639 08/14/23 1641 08/14/23 1653 08/15/23 0455 08/17/23 0319 08/18/23 0524 10/10/23 1634 10/11/23 1036  BUN 23 21 30* 37* 31* 32* 35* 43* 48* 42* 37*  CREATININE 1.88* 1.30* 1.64* 1.71* 1.82* 1.80* 1.59* 1.61* 1.47* 2.44* 2.33*  CO2 29 24 23 22 22   --  25 26 26 24 24    Cardiac Enzymes: No results for input(s): CKTOTAL, CKMB, CKMBINDEX, TROPONINI in the last 168 hours. BNP (last 3 results) No results for input(s): PROBNP in the last 8760 hours. HbA1C: No results for input(s): HGBA1C in the last 72 hours. CBG: No results for input(s): GLUCAP in the last 168 hours.  Lipid Profile: No results for input(s): CHOL, HDL, LDLCALC, TRIG, CHOLHDL, LDLDIRECT in the last 72 hours. Thyroid  Function Tests: No results for input(s): TSH, T4TOTAL, FREET4, T3FREE, THYROIDAB in the last 72 hours. Anemia Panel: No results for input(s): VITAMINB12, FOLATE, FERRITIN, TIBC, IRON, RETICCTPCT in the last 72 hours. Urine analysis:    Component Value Date/Time   COLORURINE YELLOW 08/14/2023 1800   APPEARANCEUR HAZY (A) 08/14/2023 1800   LABSPEC 1.010 08/14/2023 1800   PHURINE 6.0 08/14/2023 1800   GLUCOSEU NEGATIVE 08/14/2023 1800   HGBUR SMALL (A) 08/14/2023 1800   BILIRUBINUR NEGATIVE 08/14/2023 1800   BILIRUBINUR ++ 11/18/2012 1319   KETONESUR NEGATIVE 08/14/2023 1800   PROTEINUR 30 (A) 08/14/2023 1800   UROBILINOGEN 0.2 08/04/2014 1620   NITRITE NEGATIVE 08/14/2023 1800   LEUKOCYTESUR LARGE (A) 08/14/2023 1800   Radiological Exams on Admission: DG Chest Portable 1 View Result Date: 10/11/2023 CLINICAL DATA:  Hyperglycemia. EXAM: PORTABLE CHEST 1 VIEW COMPARISON:  August 14, 2023 FINDINGS: The cardiac  silhouette is enlarged and unchanged in size. Marked severity calcification of the aortic arch is seen. Low lung volumes are noted. There is mild prominence of the pulmonary vasculature. Very mild atelectatic changes are suspected within the right infrahilar region and left lung base. No pleural effusion or pneumothorax is identified. Multilevel degenerative changes are seen throughout the thoracic spine. IMPRESSION: 1. Stable cardiomegaly with mild pulmonary vascular congestion. 2. Very mild right infrahilar and left basilar atelectasis. Electronically Signed   By: Suzen Dials M.D.   On: 10/11/2023 11:47   Data Reviewed: Relevant notes from primary care and specialist visits, past discharge summaries as available in EHR, including Care Everywhere . Prior diagnostic testing as pertinent to current admission diagnoses, Updated medications and problem lists for reconciliation .ED course, including vitals, labs, imaging, treatment and response to treatment,Triage notes, nursing and pharmacy notes and ED provider's notes.Notable results as noted in HPI.Discussed case with EDMD/ ED APP/ or Specialty MD on call and as needed.  Assessment & Plan  83 year old male coming in for abnormal blood work with hypercalcemia with known history of primary hyperparathyroidism, I do not see endocrinology or any treatment regimen previous note does document hydration and diuretic use if levels increase.  Patient today also noted to have dehydration with low blood pressure AKI, abnormal urine C/W urinary tract infection, patient's had history of nephrolithiasis and CKD and UTIs, admitted today for same.  >> Hypercalcemia secondary to primary hyperparathyroidism: Patient being admitted to med/tele or cardiac telemetry unit depending on bed availability for hypercalcemia and cardiac monitoring patient has developed prolonged PR interval and QT is 419.  Patient received 1 L normal saline in the ED and of continued at 75  cc/h,loop diuretic limited due to low to low normal BP we will give calcitonin and consider bisphosphonate therapy.    >> Urinary tract infection: Cont with rocephin  2 gm q 24.  Follow C/S.    >> BPH: Flomax  continued.    >> Essential hypertension: Meds held. Vitals:   10/11/23 1045 10/11/23 1215 10/11/23 1245 10/11/23 1300  BP: (!) 110/50 (!) 92/50 (!) 123/54 115/63   10/11/23 1315  BP: 133/62    >> CAD: Cont asa/ coreg  at low dose.   >> Chronic HFpEF: Stable strict I/o as pt needs IVF hydration.  Lasix  PRN.   >> AKI on CKD stage IIIa: Avoid contrast and renally dose meds.  Lab Results  Component Value Date   CREATININE 2.33 (H) 10/11/2023   CREATININE  2.44 (H) 10/10/2023   CREATININE 1.47 (H) 08/18/2023     >> Anemia: Suspect ACD. Stable. Follow.   >> GERD/ History of dysphagia: Followed by [facility], aspiration precaution , bedside swallow evaluation. IV PPI. Aspiration precaution.  >> OSA on CPAP: CPAP per home settings at bedtime.    >> COPD: Prn albuterol  .   DVT prophylaxis:  Heparin .  Consults:  None.   Advance Care Planning:    Code Status: Do not attempt resuscitation (DNR) PRE-ARREST INTERVENTIONS DESIRED   Family Communication:  None.  Disposition Plan:  Home.  Severity of Illness: The appropriate patient status for this patient is OBSERVATION. Observation status is judged to be reasonable and necessary in order to provide the required intensity of service to ensure the patient's safety. The patient's presenting symptoms, physical exam findings, and initial radiographic and laboratory data in the context of their medical condition is felt to place them at decreased risk for further clinical deterioration. Furthermore, it is anticipated that the patient will be medically stable for discharge from the hospital within 2 midnights of admission.   Unresulted Labs (From admission, onward)     Start     Ordered   10/12/23 0500   Comprehensive metabolic panel  Tomorrow morning,   R        10/11/23 1350   10/12/23 0500  CBC  Tomorrow morning,   R        10/11/23 1350   10/12/23 0500  Magnesium   Tomorrow morning,   R        10/11/23 1351   10/12/23 0500  Phosphorus  Tomorrow morning,   R        10/11/23 1351   10/11/23 1352  Urinalysis, w/ Reflex to Culture (Infection Suspected) -Urine, Random  (Urine Labs)  Add-on,   R       Question:  Specimen Source  Answer:  Urine, Random   10/11/23 1351   10/11/23 1036  Urinalysis, Routine w reflex microscopic -Urine, Clean Catch  Once,   URGENT       Question:  Specimen Source  Answer:  Urine, Clean Catch   10/11/23 1036   10/11/23 1036  Parathyroid  hormone, intact (no Ca)  Once,   URGENT        10/11/23 1036   10/11/23 1036  PTH-related peptide  Once,   URGENT        10/11/23 1036   10/11/23 1036  Calcium, ionized  Once,   STAT        10/11/23 1036            Meds ordered this encounter  Medications   sodium chloride  0.9 % bolus 1,000 mL   haloperidol  lactate (HALDOL ) injection 5 mg   0.9 %  sodium chloride  infusion   aspirin  EC tablet 81 mg   carvedilol  (COREG ) tablet 6.25 mg   pravastatin  (PRAVACHOL ) tablet 40 mg    1 qd     tamsulosin  (FLOMAX ) capsule 0.4 mg   feeding supplement (PRO-STAT 64) liquid 30 mL   calcitonin (MIACALCIN ) injection 4 Units/kg    Criteria for use (must meet all) - CorrCa >/= 14, or >/= 12 with AMS, receiving IV hydration with NS unless contraindicated, IV bisphosphonate x1 ordered, unless contraindicated or given in past 7 days.   D/C after: CorrCa < 14 without AMS, or CorrCa < 12, or 4 doses, whichever occurs first.   cefTRIAXone  (ROCEPHIN ) 2 g in sodium chloride  0.9 % 100 mL IVPB  Antibiotic Indication::   UTI   heparin  injection 5,000 Units   sodium chloride  flush (NS) 0.9 % injection 3 mL   OR Linked Order Group    acetaminophen  (TYLENOL ) tablet 650 mg    acetaminophen  (TYLENOL ) suppository 650 mg     Orders Placed This  Encounter  Procedures   BLADDER CATHETERIZATION   DG Chest Portable 1 View   Comprehensive metabolic panel   CBC with Diff   Urinalysis, Routine w reflex microscopic -Urine, Clean Catch   Parathyroid  hormone, intact (no Ca)   PTH-related peptide   Magnesium    Phosphorus   Lipase, blood   Calcium, ionized   Comprehensive metabolic panel   CBC   Magnesium    Phosphorus   Urinalysis, w/ Reflex to Culture (Infection Suspected) -Urine, Random   Diet Heart Room service appropriate? Yes; Fluid consistency: Thin   Initiate Carrier Fluid Protocol   Bladder scan   Insert foley catheter   Strict intake and output   Swallow screen   Maintain IV access   Vital signs   Notify physician (specify)   Mobility Protocol: No Restrictions   Refer to Sidebar Report Mobility Protocol for Adult Inpatient   Initiate Adult Central Line Maintenance and Catheter Protocol for patients with central line (CVC, PICC, Port, Hemodialysis, Trialysis)   Daily weights   Intake and Output   Initiate CHG Protocol   Do not place and if present remove PureWick   Initiate Oral Care Protocol   Initiate Carrier Fluid Protocol   RN may order General Admission PRN Orders utilizing General Admission PRN medications (through manage orders) for the following patient needs: allergy symptoms (Claritin), cold sores (Carmex), cough (Robitussin DM), eye irritation (Liquifilm Tears), hemorrhoids (Tucks), indigestion (Maalox), minor skin irritation (Hydrocortisone  Cream), muscle pain (Ben Gay), nose irritation (saline nasal spray) and sore throat (Chloraseptic spray).   Cardiac Monitoring - Continuous Indefinite   Ambulate with assistance   Do not attempt resuscitation (DNR) Pre-Arrest Interventions Desired   Consult to hospitalist   Pulse oximetry check with vital signs   Oxygen  therapy Mode or (Route): Nasal cannula; Liters Per Minute: 2; Keep O2 saturation between: greater than 92 %   ED EKG   EKG 12-Lead   Insert  peripheral IV   Place in observation (patient's expected length of stay will be less than 2 midnights)   Aspiration precautions   Fall precautions    Author: Mario LULLA Blanch, MD 12 pm- 8 pm. [facility]. 10/11/2023 1:51 PM Please note for any communication after hours contact [facility] Assigned provider on call on Amion.

## 2023-10-11 NOTE — ED Triage Notes (Signed)
 Patient BIB EMS from [facility] for abnormal labs. Labs have been abnormal for 3 months. Patient has hx of dementia. 4L [place] at all times. VSS.

## 2023-10-12 ENCOUNTER — Inpatient Hospital Stay (HOSPITAL_COMMUNITY)

## 2023-10-12 DIAGNOSIS — Z96653 Presence of artificial knee joint, bilateral: Secondary | ICD-10-CM | POA: Diagnosis present

## 2023-10-12 DIAGNOSIS — J4489 Other specified chronic obstructive pulmonary disease: Secondary | ICD-10-CM | POA: Diagnosis present

## 2023-10-12 DIAGNOSIS — N179 Acute kidney failure, unspecified: Secondary | ICD-10-CM | POA: Diagnosis present

## 2023-10-12 DIAGNOSIS — D631 Anemia in chronic kidney disease: Secondary | ICD-10-CM | POA: Diagnosis present

## 2023-10-12 DIAGNOSIS — N1832 Chronic kidney disease, stage 3b: Secondary | ICD-10-CM | POA: Diagnosis present

## 2023-10-12 DIAGNOSIS — Z9981 Dependence on supplemental oxygen: Secondary | ICD-10-CM | POA: Diagnosis not present

## 2023-10-12 DIAGNOSIS — N390 Urinary tract infection, site not specified: Secondary | ICD-10-CM | POA: Diagnosis present

## 2023-10-12 DIAGNOSIS — J439 Emphysema, unspecified: Secondary | ICD-10-CM | POA: Diagnosis not present

## 2023-10-12 DIAGNOSIS — E785 Hyperlipidemia, unspecified: Secondary | ICD-10-CM | POA: Diagnosis present

## 2023-10-12 DIAGNOSIS — Z515 Encounter for palliative care: Secondary | ICD-10-CM | POA: Diagnosis not present

## 2023-10-12 DIAGNOSIS — E210 Primary hyperparathyroidism: Secondary | ICD-10-CM | POA: Diagnosis present

## 2023-10-12 DIAGNOSIS — I7143 Infrarenal abdominal aortic aneurysm, without rupture: Secondary | ICD-10-CM | POA: Diagnosis not present

## 2023-10-12 DIAGNOSIS — R1310 Dysphagia, unspecified: Secondary | ICD-10-CM | POA: Diagnosis present

## 2023-10-12 DIAGNOSIS — I5032 Chronic diastolic (congestive) heart failure: Secondary | ICD-10-CM | POA: Diagnosis present

## 2023-10-12 DIAGNOSIS — I2510 Atherosclerotic heart disease of native coronary artery without angina pectoris: Secondary | ICD-10-CM | POA: Diagnosis present

## 2023-10-12 DIAGNOSIS — E860 Dehydration: Secondary | ICD-10-CM | POA: Diagnosis present

## 2023-10-12 DIAGNOSIS — G4733 Obstructive sleep apnea (adult) (pediatric): Secondary | ICD-10-CM | POA: Diagnosis present

## 2023-10-12 DIAGNOSIS — K8020 Calculus of gallbladder without cholecystitis without obstruction: Secondary | ICD-10-CM | POA: Diagnosis not present

## 2023-10-12 DIAGNOSIS — R410 Disorientation, unspecified: Secondary | ICD-10-CM | POA: Diagnosis not present

## 2023-10-12 DIAGNOSIS — Z66 Do not resuscitate: Secondary | ICD-10-CM | POA: Diagnosis present

## 2023-10-12 DIAGNOSIS — F0150 Vascular dementia without behavioral disturbance: Secondary | ICD-10-CM | POA: Diagnosis present

## 2023-10-12 DIAGNOSIS — J9611 Chronic respiratory failure with hypoxia: Secondary | ICD-10-CM | POA: Diagnosis present

## 2023-10-12 DIAGNOSIS — N200 Calculus of kidney: Secondary | ICD-10-CM | POA: Diagnosis not present

## 2023-10-12 DIAGNOSIS — M8580 Other specified disorders of bone density and structure, unspecified site: Secondary | ICD-10-CM | POA: Diagnosis present

## 2023-10-12 DIAGNOSIS — G928 Other toxic encephalopathy: Secondary | ICD-10-CM | POA: Diagnosis not present

## 2023-10-12 DIAGNOSIS — Z7189 Other specified counseling: Secondary | ICD-10-CM | POA: Diagnosis not present

## 2023-10-12 DIAGNOSIS — I130 Hypertensive heart and chronic kidney disease with heart failure and stage 1 through stage 4 chronic kidney disease, or unspecified chronic kidney disease: Secondary | ICD-10-CM | POA: Diagnosis present

## 2023-10-12 DIAGNOSIS — K219 Gastro-esophageal reflux disease without esophagitis: Secondary | ICD-10-CM | POA: Diagnosis present

## 2023-10-12 DIAGNOSIS — E039 Hypothyroidism, unspecified: Secondary | ICD-10-CM | POA: Diagnosis present

## 2023-10-12 LAB — COMPREHENSIVE METABOLIC PANEL WITH GFR
ALT: 18 U/L (ref 0–44)
AST: 23 U/L (ref 15–41)
Albumin: 2.6 g/dL — ABNORMAL LOW (ref 3.5–5.0)
Alkaline Phosphatase: 86 U/L (ref 38–126)
Anion gap: 7 (ref 5–15)
BUN: 34 mg/dL — ABNORMAL HIGH (ref 8–23)
CO2: 24 mmol/L (ref 22–32)
Calcium: 12.7 mg/dL — ABNORMAL HIGH (ref 8.9–10.3)
Chloride: 108 mmol/L (ref 98–111)
Creatinine, Ser: 2.15 mg/dL — ABNORMAL HIGH (ref 0.61–1.24)
GFR, Estimated: 29 mL/min — ABNORMAL LOW (ref >60)
Glucose, Bld: 132 mg/dL — ABNORMAL HIGH (ref 70–99)
Potassium: 3.9 mmol/L (ref 3.5–5.1)
Sodium: 139 mmol/L (ref 135–145)
Total Bilirubin: 0.6 mg/dL (ref 0.0–1.2)
Total Protein: 6.7 g/dL (ref 6.5–8.1)

## 2023-10-12 LAB — CBC
HCT: 39.3 % (ref 39.0–52.0)
Hemoglobin: 12.4 g/dL — ABNORMAL LOW (ref 13.0–17.0)
MCH: 28.8 pg (ref 26.0–34.0)
MCHC: 31.6 g/dL (ref 30.0–36.0)
MCV: 91.2 fL (ref 80.0–100.0)
Platelets: 261 K/uL (ref 150–400)
RBC: 4.31 MIL/uL (ref 4.22–5.81)
RDW: 13.2 % (ref 11.5–15.5)
WBC: 10.2 K/uL (ref 4.0–10.5)
nRBC: 0 % (ref 0.0–0.2)

## 2023-10-12 LAB — MAGNESIUM: Magnesium: 1.9 mg/dL (ref 1.7–2.4)

## 2023-10-12 LAB — PHOSPHORUS: Phosphorus: 2 mg/dL — ABNORMAL LOW (ref 2.5–4.6)

## 2023-10-12 MED ORDER — FUROSEMIDE 10 MG/ML IJ SOLN
40.0000 mg | Freq: Once | INTRAMUSCULAR | Status: AC
  Administered 2023-10-12: 40 mg via INTRAVENOUS
  Filled 2023-10-12: qty 4

## 2023-10-12 MED ORDER — RISPERIDONE 1 MG PO TABS
0.5000 mg | ORAL_TABLET | Freq: Every day | ORAL | Status: DC
  Administered 2023-10-12: 0.5 mg via ORAL
  Filled 2023-10-12: qty 1

## 2023-10-12 MED ORDER — SODIUM CHLORIDE 0.9 % IV SOLN
2.0000 g | Freq: Every day | INTRAVENOUS | Status: DC
  Administered 2023-10-12: 2 g via INTRAVENOUS
  Filled 2023-10-12: qty 20

## 2023-10-12 MED ORDER — CHLORHEXIDINE GLUCONATE CLOTH 2 % EX PADS
6.0000 | MEDICATED_PAD | Freq: Every day | CUTANEOUS | Status: DC
  Administered 2023-10-12 – 2023-10-13 (×2): 6 via TOPICAL

## 2023-10-12 NOTE — Progress Notes (Signed)
 VAST consult received to obtain IV access after patient removed his IV. He is currently confused and agitated. Unit nurse is seeking a restraint order. VAST RN will return to place IV access once patient is restrained to prevent him from pulling out PIV again.

## 2023-10-12 NOTE — Plan of Care (Signed)

## 2023-10-12 NOTE — Progress Notes (Signed)
 PROGRESS NOTE    [name]  FMW:[id number] DOB: July 18, 1940 DOA: 10/11/2023 PCP: Alphonsa Glendia LABOR, MD   Brief Narrative:    Assessment & Plan:   Principal Problem:   Parathyroid  related hypercalcemia ([name]) Active Problems:   Essential hypertension   Chronic heart failure with preserved ejection fraction (HFpEF) ([name]) - No ARB/ACEI due to CKD stage 3b    [name] is a 83 y.o. male with past medical history  of  CAD, HFpEF, COPD on 4 L O2, OSA on CPAP, ulcerative colitis, ankylosing spondylitis, hypertension, hyperlipidemia, CKD 3A, primary hyperparathyroidism, morbid obesity, mood disorder, dementia, coming for abnormal labs and hypercalcemia of 13.6, also concerns for worsening dementia    >> Hypercalcemia secondary to primary hyperparathyroidism: Continue to monitor daily Receiving IV fluid 75 cc/h, will give Lasix  40 mg x 1.  Calcitonin given Recheck labs in the AM. Question of primary hypothyroidism in the past.  Will check PTH level, vit d Daughter states she was told he might have cancer and would like to request imaging.  Will obtain CT chest abdomen and pelvis but is will be done without IV contrast due to CKD.     >> Essential hypertension: Meds held.  >>Dementia:  Discussed with daughter over the phone.  Sounds like patient has declined since July of this year.  He is having hallucinations, agitation.  He may not remember family members.  She is worried about his quality of life and has discussed with his primary care provider who recommended palliative care/neuropsych evaluation. -Will start risperidone  at night -Will consult palliative care for goals of care discussion -Neuropsych evaluation as an outpatient  DVT prophylaxis:  Code Status:  dnr Family Communication:  Disposition Plan: Status is: Inpatient Remains inpatient appropriate because:     Consultants:   Procedures:   Antimicrobials:    Subjective:  Patient seen and examined  at bedside earlier today.  He was drowsy but arousable, was not able to engage in conversation.  Vital signs are stable.  Objective: Vitals:   10/12/23 0046 10/12/23 0427 10/12/23 0735 10/12/23 1641  BP: (!) 133/56 118/63 (!) 93/46 (!) 132/54  Pulse: 86 86 80 69  Resp: 18  19   Temp: 98.2 F (36.8 C) 98.8 F (37.1 C) 99.1 F (37.3 C) 97.7 F (36.5 C)  TempSrc:      SpO2: 97% 99% 100% 100%  Weight:      Height:        Intake/Output Summary (Last 24 hours) at 10/12/2023 1729 Last data filed at 10/12/2023 1512 Gross per 24 hour  Intake 1617.17 ml  Output 2100 ml  Net -482.83 ml   Filed Weights   10/11/23 1400  Weight: 124.3 kg    Examination:  General exam: Appears calm and comfortable  Respiratory system: Bilateral decreased breath sounds at bases Cardiovascular system: S1 & S2 heard, Rate controlled Gastrointestinal system: Abdomen is nondistended, soft and nontender. Normal bowel sounds heard. Extremities: No cyanosis, clubbing, edema  Central nervous system: Alert and oriented. No focal neurological deficits. Moving extremities Skin: No rashes, lesions or ulcers Psychiatry: Judgement and insight appear normal. Mood & affect appropriate.     Data Reviewed: I have personally reviewed following labs and imaging studies  CBC: Recent Labs  Lab 10/10/23 1634 10/11/23 1036 10/12/23 0153  WBC 12.4* 9.7 10.2  NEUTROABS 5.6 4.6  --   HGB 13.0 12.4* 12.4*  HCT 40.5 40.7 39.3  MCV 92 94.9 91.2  PLT 320 287  261   Basic Metabolic Panel: Recent Labs  Lab 10/10/23 1634 10/11/23 1036 10/12/23 0153  NA 144 138 139  K 5.3* 4.3 3.9  CL 106 104 108  CO2 24 24 24   GLUCOSE 112* 115* 132*  BUN 42* 37* 34*  CREATININE 2.44* 2.33* 2.15*  CALCIUM 13.4* 13.6* 12.7*  MG  --  2.1 1.9  PHOS  --  3.0 2.0*   GFR: Estimated Creatinine Clearance: 34.7 mL/min (A) (by C-G formula based on SCr of 2.15 mg/dL (H)). Liver Function Tests: Recent Labs  Lab 10/10/23 1634  10/11/23 1036 10/12/23 0153  AST 21 23 23   ALT 16 18 18   ALKPHOS 126* 89 86  BILITOT 0.3 0.4 0.6  PROT 7.1 6.8 6.7  ALBUMIN  3.5* 2.7* 2.6*   Recent Labs  Lab 10/11/23 1036  LIPASE 36   No results for input(s): AMMONIA in the last 168 hours. Coagulation Profile: No results for input(s): INR, PROTIME in the last 168 hours. Cardiac Enzymes: No results for input(s): CKTOTAL, CKMB, CKMBINDEX, TROPONINI in the last 168 hours. BNP (last 3 results) No results for input(s): PROBNP in the last 8760 hours. HbA1C: No results for input(s): HGBA1C in the last 72 hours. CBG: No results for input(s): GLUCAP in the last 168 hours. Lipid Profile: No results for input(s): CHOL, HDL, LDLCALC, TRIG, CHOLHDL, LDLDIRECT in the last 72 hours. Thyroid  Function Tests: No results for input(s): TSH, T4TOTAL, FREET4, T3FREE, THYROIDAB in the last 72 hours. Anemia Panel: No results for input(s): VITAMINB12, FOLATE, FERRITIN, TIBC, IRON, RETICCTPCT in the last 72 hours. Sepsis Labs: No results for input(s): PROCALCITON, LATICACIDVEN in the last 168 hours.  No results found for this or any previous visit (from the past 240 hours).       Radiology Studies: DG Chest Portable 1 View Result Date: 10/11/2023 CLINICAL DATA:  Hyperglycemia. EXAM: PORTABLE CHEST 1 VIEW COMPARISON:  August 14, 2023 FINDINGS: The cardiac silhouette is enlarged and unchanged in size. Marked severity calcification of the aortic arch is seen. Low lung volumes are noted. There is mild prominence of the pulmonary vasculature. Very mild atelectatic changes are suspected within the right infrahilar region and left lung base. No pleural effusion or pneumothorax is identified. Multilevel degenerative changes are seen throughout the thoracic spine. IMPRESSION: 1. Stable cardiomegaly with mild pulmonary vascular congestion. 2. Very mild right infrahilar and left basilar atelectasis.  Electronically Signed   By: Suzen Dials M.D.   On: 10/11/2023 11:47        Scheduled Meds:  (feeding supplement) PROSource Plus  30 mL Oral Daily   aspirin  EC  81 mg Oral Daily   calcitonin  400 Units Intramuscular BID   carvedilol   6.25 mg Oral BID WC   Chlorhexidine  Gluconate Cloth  6 each Topical Daily   haloperidol  lactate  5 mg Intramuscular Once   heparin   5,000 Units Subcutaneous Q8H   risperiDONE   0.5 mg Oral QHS   sodium chloride  flush  3 mL Intravenous Q12H   Continuous Infusions:  sodium chloride  Stopped (10/12/23 1210)          Skilynn Durney, MD [facility] 10/12/2023, 5:29 PM

## 2023-10-12 NOTE — Progress Notes (Signed)
 9/6 Patient unable to communicate, I spoke to Amaury Kuzel Cozart, daughter telephonically [phone] for verbal consent.

## 2023-10-12 NOTE — Care Management Obs Status (Signed)
 MEDICARE OBSERVATION STATUS NOTIFICATION   Patient Details  Name: [name] MRN: [id number] Date of Birth: 1940-07-28   Medicare Observation Status Notification Given:  Yes    Jon Cruel 10/12/2023, 3:43 PM

## 2023-10-13 ENCOUNTER — Inpatient Hospital Stay (HOSPITAL_COMMUNITY)

## 2023-10-13 DIAGNOSIS — Z7189 Other specified counseling: Secondary | ICD-10-CM

## 2023-10-13 DIAGNOSIS — I7143 Infrarenal abdominal aortic aneurysm, without rupture: Secondary | ICD-10-CM | POA: Diagnosis not present

## 2023-10-13 DIAGNOSIS — J439 Emphysema, unspecified: Secondary | ICD-10-CM | POA: Diagnosis not present

## 2023-10-13 DIAGNOSIS — R410 Disorientation, unspecified: Secondary | ICD-10-CM

## 2023-10-13 DIAGNOSIS — Z515 Encounter for palliative care: Secondary | ICD-10-CM

## 2023-10-13 DIAGNOSIS — K8020 Calculus of gallbladder without cholecystitis without obstruction: Secondary | ICD-10-CM | POA: Diagnosis not present

## 2023-10-13 DIAGNOSIS — N200 Calculus of kidney: Secondary | ICD-10-CM | POA: Diagnosis not present

## 2023-10-13 LAB — COMPREHENSIVE METABOLIC PANEL WITH GFR
ALT: 19 U/L (ref 0–44)
AST: 46 U/L — ABNORMAL HIGH (ref 15–41)
Albumin: 2.6 g/dL — ABNORMAL LOW (ref 3.5–5.0)
Alkaline Phosphatase: 80 U/L (ref 38–126)
Anion gap: 7 (ref 5–15)
BUN: 28 mg/dL — ABNORMAL HIGH (ref 8–23)
CO2: 23 mmol/L (ref 22–32)
Calcium: 12.1 mg/dL — ABNORMAL HIGH (ref 8.9–10.3)
Chloride: 111 mmol/L (ref 98–111)
Creatinine, Ser: 2.1 mg/dL — ABNORMAL HIGH (ref 0.61–1.24)
GFR, Estimated: 30 mL/min — ABNORMAL LOW (ref >60)
Glucose, Bld: 116 mg/dL — ABNORMAL HIGH (ref 70–99)
Potassium: 4 mmol/L (ref 3.5–5.1)
Sodium: 141 mmol/L (ref 135–145)
Total Bilirubin: 0.5 mg/dL (ref 0.0–1.2)
Total Protein: 6.7 g/dL (ref 6.5–8.1)

## 2023-10-13 LAB — CBC WITH DIFFERENTIAL/PLATELET
Abs Immature Granulocytes: 0.09 K/uL — ABNORMAL HIGH (ref 0.00–0.07)
Basophils Absolute: 0.1 K/uL (ref 0.0–0.1)
Basophils Relative: 1 %
Eosinophils Absolute: 0.3 K/uL (ref 0.0–0.5)
Eosinophils Relative: 2 %
HCT: 42.6 % (ref 39.0–52.0)
Hemoglobin: 13.1 g/dL (ref 13.0–17.0)
Immature Granulocytes: 1 %
Lymphocytes Relative: 28 %
Lymphs Abs: 3.7 K/uL (ref 0.7–4.0)
MCH: 28.2 pg (ref 26.0–34.0)
MCHC: 30.8 g/dL (ref 30.0–36.0)
MCV: 91.8 fL (ref 80.0–100.0)
Monocytes Absolute: 0.9 K/uL (ref 0.1–1.0)
Monocytes Relative: 6 %
Neutro Abs: 8.2 K/uL — ABNORMAL HIGH (ref 1.7–7.7)
Neutrophils Relative %: 62 %
Platelets: 289 K/uL (ref 150–400)
RBC: 4.64 MIL/uL (ref 4.22–5.81)
RDW: 13.4 % (ref 11.5–15.5)
WBC: 13.2 K/uL — ABNORMAL HIGH (ref 4.0–10.5)
nRBC: 0 % (ref 0.0–0.2)

## 2023-10-13 LAB — VITAMIN D 25 HYDROXY (VIT D DEFICIENCY, FRACTURES): Vit D, 25-Hydroxy: 60.88 ng/mL (ref 30–100)

## 2023-10-13 LAB — PARATHYROID HORMONE, INTACT (NO CA): PTH: 77 pg/mL — ABNORMAL HIGH (ref 15–65)

## 2023-10-13 MED ORDER — DIAZEPAM 5 MG/ML IJ SOLN
5.0000 mg | Freq: Four times a day (QID) | INTRAMUSCULAR | Status: DC
  Administered 2023-10-13 – 2023-10-14 (×5): 5 mg via INTRAVENOUS
  Filled 2023-10-13 (×5): qty 2

## 2023-10-13 MED ORDER — BIOTENE DRY MOUTH MT LIQD: 15.0000 mL | OROMUCOSAL | Status: DC | PRN

## 2023-10-13 MED ORDER — GLYCOPYRROLATE 0.2 MG/ML IJ SOLN: 0.2000 mg | INTRAMUSCULAR | Status: DC | PRN

## 2023-10-13 MED ORDER — ONDANSETRON HCL 4 MG/2ML IJ SOLN
4.0000 mg | Freq: Four times a day (QID) | INTRAMUSCULAR | Status: DC | PRN
Start: 2023-10-13 — End: 2023-10-14

## 2023-10-13 MED ORDER — POLYVINYL ALCOHOL 1.4 % OP SOLN: 1.0000 [drp] | Freq: Four times a day (QID) | OPHTHALMIC | Status: DC | PRN

## 2023-10-13 MED ORDER — DIAZEPAM 5 MG/ML IJ SOLN: 2.5000 mg | INTRAMUSCULAR | Status: DC | PRN

## 2023-10-13 MED ORDER — HALOPERIDOL LACTATE 5 MG/ML IJ SOLN: 5.0000 mg | Freq: Four times a day (QID) | INTRAMUSCULAR | Status: DC | PRN

## 2023-10-13 MED ORDER — SODIUM CHLORIDE 0.9 % IV SOLN: INTRAVENOUS | Status: DC

## 2023-10-13 MED ORDER — GLYCOPYRROLATE 1 MG PO TABS: 1.0000 mg | ORAL_TABLET | ORAL | Status: DC | PRN

## 2023-10-13 MED ORDER — ONDANSETRON 4 MG PO TBDP
4.0000 mg | ORAL_TABLET | Freq: Four times a day (QID) | ORAL | Status: DC | PRN
Start: 2023-10-13 — End: 2023-10-14

## 2023-10-13 MED ORDER — HYDROMORPHONE HCL 1 MG/ML IJ SOLN
0.5000 mg | INTRAMUSCULAR | Status: DC | PRN
  Administered 2023-10-13 – 2023-10-14 (×3): 1 mg via INTRAVENOUS
  Filled 2023-10-13 (×3): qty 1

## 2023-10-13 NOTE — Progress Notes (Signed)
 PROGRESS NOTE    [name]  FMW:[id number] DOB: 12-23-1940 DOA: 10/11/2023 PCP: Alphonsa Glendia LABOR, MD   Brief Narrative:    Assessment & Plan:   Principal Problem:   Parathyroid  related hypercalcemia ([name]) Active Problems:   Essential hypertension   Chronic heart failure with preserved ejection fraction (HFpEF) ([name]) - No ARB/ACEI due to CKD stage 3b    [name] is a 83 y.o. male with past medical history  of  CAD, HFpEF, COPD on 4 L O2, OSA on CPAP, ulcerative colitis, ankylosing spondylitis, hypertension, hyperlipidemia, CKD 3A, primary hyperparathyroidism, morbid obesity, mood disorder, dementia, coming for abnormal labs and hypercalcemia of 13.6, also concerns for worsening dementia    >> Hypercalcemia secondary to primary hyperparathyroidism: Calcium 13.6 on presentation, decreased to 12.5, receiving IV fluid 75 cc/h, will give Lasix  40 mg x 1 today again.  Received calcitonin. Recheck labs in the AM. Question of primary hyperparathyroidism in the past.  PTH level, vit d 76 Daughter requested CT imaging to rule out malignancy, CT chest abdomen pelvis without contrast  pending  >> Essential hypertension: Meds held.  CKD: Creatinine stable 2.34  >>Dementia:  Discussed with daughter over the phone.  Sounds like patient has declined since July of this year.  He is having hallucinations, agitation.  He may not remember family members.  She is worried about his quality of life and has discussed with his primary care provider who recommended palliative care/neuropsych evaluation. - Palliative care was consulted.  They have met with family and decision was made for comfort care/hospice.  DVT prophylaxis:  Code Status:  dnr Family Communication:  Disposition Plan: Hospice. Status is: Inpatient Remains inpatient appropriate because:     Consultants:   Procedures:   Antimicrobials:    Subjective:  Patient seen and examined at bedside earlier today.  He  was drowsy but arousable, was not able to engage in conversation.  Vital signs are stable.  Objective: Vitals:   10/12/23 2200 10/13/23 0251 10/13/23 0500 10/13/23 0800  BP:  (!) 164/71  (!) 104/48  Pulse:  74  89  Resp:  17    Temp:  98.1 F (36.7 C)  (!) 97.5 F (36.4 C)  TempSrc:    Axillary  SpO2: 95% 91%  90%  Weight:   115.4 kg   Height:        Intake/Output Summary (Last 24 hours) at 10/13/2023 1103 Last data filed at 10/12/2023 2124 Gross per 24 hour  Intake 1380.17 ml  Output 1550 ml  Net -169.83 ml   Filed Weights   10/11/23 1400 10/12/23 1735 10/13/23 0500  Weight: 124.3 kg 115.4 kg 115.4 kg    Examination:  General exam: Appears calm and comfortable  Respiratory system: Bilateral decreased breath sounds at bases Cardiovascular system: S1 & S2 heard, Rate controlled Gastrointestinal system: Abdomen is nondistended, soft and nontender. Normal bowel sounds heard. Extremities: No cyanosis, clubbing, edema  Central nervous system: Alert and oriented. No focal neurological deficits. Moving extremities Skin: No rashes, lesions or ulcers Psychiatry: Judgement and insight appear normal. Mood & affect appropriate.     Data Reviewed: I have personally reviewed following labs and imaging studies  CBC: Recent Labs  Lab 10/10/23 1634 10/11/23 1036 10/12/23 0153 10/13/23 0138  WBC 12.4* 9.7 10.2 13.2*  NEUTROABS 5.6 4.6  --  8.2*  HGB 13.0 12.4* 12.4* 13.1  HCT 40.5 40.7 39.3 42.6  MCV 92 94.9 91.2 91.8  PLT 320 287 261 289  Basic Metabolic Panel: Recent Labs  Lab 10/10/23 1634 10/11/23 1036 10/12/23 0153 10/13/23 0138  NA 144 138 139 141  K 5.3* 4.3 3.9 4.0  CL 106 104 108 111  CO2 24 24 24 23   GLUCOSE 112* 115* 132* 116*  BUN 42* 37* 34* 28*  CREATININE 2.44* 2.33* 2.15* 2.10*  CALCIUM 13.4* 13.6* 12.7* 12.1*  MG  --  2.1 1.9  --   PHOS  --  3.0 2.0*  --    GFR: Estimated Creatinine Clearance: 34.2 mL/min (A) (by C-G formula based on SCr of 2.1  mg/dL (H)). Liver Function Tests: Recent Labs  Lab 10/10/23 1634 10/11/23 1036 10/12/23 0153 10/13/23 0138  AST 21 23 23  46*  ALT 16 18 18 19   ALKPHOS 126* 89 86 80  BILITOT 0.3 0.4 0.6 0.5  PROT 7.1 6.8 6.7 6.7  ALBUMIN  3.5* 2.7* 2.6* 2.6*   Recent Labs  Lab 10/11/23 1036  LIPASE 36   No results for input(s): AMMONIA in the last 168 hours. Coagulation Profile: No results for input(s): INR, PROTIME in the last 168 hours. Cardiac Enzymes: No results for input(s): CKTOTAL, CKMB, CKMBINDEX, TROPONINI in the last 168 hours. BNP (last 3 results) No results for input(s): PROBNP in the last 8760 hours. HbA1C: No results for input(s): HGBA1C in the last 72 hours. CBG: No results for input(s): GLUCAP in the last 168 hours. Lipid Profile: No results for input(s): CHOL, HDL, LDLCALC, TRIG, CHOLHDL, LDLDIRECT in the last 72 hours. Thyroid  Function Tests: No results for input(s): TSH, T4TOTAL, FREET4, T3FREE, THYROIDAB in the last 72 hours. Anemia Panel: No results for input(s): VITAMINB12, FOLATE, FERRITIN, TIBC, IRON, RETICCTPCT in the last 72 hours. Sepsis Labs: No results for input(s): PROCALCITON, LATICACIDVEN in the last 168 hours.  No results found for this or any previous visit (from the past 240 hours).       Radiology Studies: CT CHEST ABDOMEN PELVIS WO CONTRAST Result Date: 10/13/2023 EXAM: CT CHEST, ABDOMEN AND PELVIS WITHOUT CONTRAST 10/13/2023 12:48:20 AM TECHNIQUE: CT of the chest, abdomen and pelvis was performed without the administration of intravenous contrast. Multiplanar reformatted images are provided for review. Automated exposure control, iterative reconstruction, and/or weight based adjustment of the mA/kV was utilized to reduce the radiation dose to as low as reasonably achievable. COMPARISON: X-ray dated 06/25/2023 to 06/30/2023; CT cervical spine 03/13/2023; CT chest January 16, 2022. CLINICAL  HISTORY: Occult malignancy. FINDINGS: CHEST: MEDIASTINUM AND LYMPH NODES: Coronary artery and aortic atherosclerotic calcifications. No pericardial effusion. LUNGS AND PLEURA: Emphysema with bullous change in the right apex. Diffuse bronchial wall thickening greatest in the lower lobes. Infiltrates in the posterior left upper lobe and bilateral lower lobes. Small bilateral pleural effusions. Findings may be due to multifocal pneumonia and/or aspiration. No pneumothorax. ABDOMEN AND PELVIS: Evaluation for occult malignancy is limited without IV contrast. LIVER: The liver is unremarkable. GALLBLADDER AND BILE DUCTS: Cholelithiasis without evidence of acute cholecystitis. No biliary dilation. SPLEEN: No acute abnormality. PANCREAS: Peripancreatic inflammatory stranding and small amount of fluid. No organized fluid collection. No ductal dilation. ADRENAL GLANDS: No acute abnormality. KIDNEYS, URETERS AND BLADDER: Nonobstructing bilateral nephrolithiasis greater on the left. No hydronephrosis. Foley catheter and gas in the nondistended bladder. GI AND BOWEL: Stomach demonstrates no acute abnormality. There is no bowel obstruction. REPRODUCTIVE ORGANS: No acute abnormality. PERITONEUM AND RETROPERITONEUM: No ascites. No free air. VASCULATURE: Aortic atherosclerotic calcification. 3.1 cm infrarenal abdominal aortic aneurysm. ABDOMINAL AND PELVIS LYMPH NODES: No lymphadenopathy. REPRODUCTIVE ORGANS: No acute  abnormality. BONES AND SOFT TISSUES: Complete fusion of the anterior osteophytes throughout the thoracolumbar spine. No evidence of acute fracture. IMPRESSION: 1. Multifocal pneumonia and/or aspiration in the lungs. 2. Acute pancreatitis. No organized fluid collection. No ductal dilation. 3. 3.1 cm infrarenal abdominal aortic aneurysm. Ultrasound Surveillance with a 3-year interval is recommended. 4. Cholelithiasis without evidence of acute cholecystitis or biliary dilation. 5. Nonobstructing bilateral nephrolithiasis  greater on the left. No hydronephrosis. Electronically signed by: Norman Gatlin MD 10/13/2023 01:00 AM EDT RP Workstation: [id number]   DG Chest Portable 1 View Result Date: 10/11/2023 CLINICAL DATA:  Hyperglycemia. EXAM: PORTABLE CHEST 1 VIEW COMPARISON:  August 14, 2023 FINDINGS: The cardiac silhouette is enlarged and unchanged in size. Marked severity calcification of the aortic arch is seen. Low lung volumes are noted. There is mild prominence of the pulmonary vasculature. Very mild atelectatic changes are suspected within the right infrahilar region and left lung base. No pleural effusion or pneumothorax is identified. Multilevel degenerative changes are seen throughout the thoracic spine. IMPRESSION: 1. Stable cardiomegaly with mild pulmonary vascular congestion. 2. Very mild right infrahilar and left basilar atelectasis. Electronically Signed   By: Suzen Dials M.D.   On: 10/11/2023 11:47        Scheduled Meds:  diazepam   5 mg Intravenous Q6H   Continuous Infusions:          Toma Erichsen, MD [facility] 10/13/2023, 11:03 AM

## 2023-10-13 NOTE — TOC Progression Note (Signed)
 Pt admitted from [facility]. Per Palliative Care APP, pt's family would like to focus on comfort and plan for transfer to [facility]. Nat with [facility] to evaluate. SW will follow.   Julien Das, MSW, LCSW [phone] (coverage)

## 2023-10-13 NOTE — Plan of Care (Signed)

## 2023-10-13 NOTE — Consult Note (Signed)
 Palliative Medicine Inpatient Consult Note  Consulting Provider: Mcarthur Pick, MD   Reason for consult:   Palliative Care Consult Services Palliative Medicine Consult  Reason for Consult? goals of care   10/13/2023  HPI:  Per intake H&P -->  [name] is a 83 y.o. male with past medical history  of  CAD, HFpEF, COPD on 4 L O2, OSA on CPAP, ulcerative colitis, ankylosing spondylitis, hypertension, hyperlipidemia, CKD 3A, primary hyperparathyroidism, morbid obesity, mood disorder, dementia, coming for abnormal labs and hypercalcemia of 13.6.  The Palliative care team has been asked to support additional goals of care conversations.   Clinical Assessment/Goals of Care:  *Please note that this is a verbal dictation therefore any spelling or grammatical errors are due to the Dragon Medical One system interpretation.  I have reviewed medical records including EPIC notes, labs and imaging, received report from bedside RN, assessed the patient who is lying in bed fidgeting with his mittens on unable to respond to questions.    A telephonic conference was held with Mr. [name] daughters [name] and Jon to further discuss diagnosis prognosis, GOC, EOL wishes, disposition and options.   I introduced Palliative Medicine as specialized medical care for people living with serious illness. It focuses on providing relief from the symptoms and stress of a serious illness. The goal is to improve quality of life for both the patient and the family.  Medical History Review and Understanding:  A review of [name]'s past medical history significant for COPD on 4 L nasal cannula at baseline, heart failure, obstructive sleep apnea requiring nightly CPAP, coronary artery disease, ulcerative colitis, hypertension, hyperlipidemia, chronic kidney disease, primary hyperparathyroidism, dementia vascular, and ankylosing spondylitis was completed  Social History:  [name] is from [place], [place]  [place].  He has lived there throughout the duration of his life.  Aniket had been married twice though his late wife passed away in 10-25-03.  He had 3 daughters the 1 passed away in 10-24-17 due to chronic medical illness.  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  He is a man of the [employer].  Functional and Nutritional State:  Preceding hospitalization, [name] had been at [facility] skilled nursing facility since July.  He was reliant for B ADLs.  His daughter shared that his eating had started to diminish over the past few days to weeks and when he did eat he would have aspirational events.  Advance Directives:  A detailed discussion was had today regarding advanced directives.  Patient's daughter Jon is a Runner, broadcasting/film/video.  Code Status:  Concepts specific to code status, artifical feeding and hydration, continued IV antibiotics and rehospitalization was had.  The difference between a aggressive medical intervention path  and a palliative comfort care path for this patient at this time was had.   Encouraged patient/family to consider DNR/DNI status understanding evidenced based poor outcomes in similar hospitalized patient, as the cause of arrest is likely associated with advanced chronic/terminal illness rather than an easily reversible acute cardio-pulmonary event. I explained that DNR/DNI does not change the medical plan and it only comes into effect after a person has arrested (died).  It is a protective measure to keep us  from harming the patient in their last moments of life.  I needed and Jon were agreeable to DNR/DNI with understanding that patient would not receive CPR, defibrillation, ACLS medications, or intubation.   Discussion:  We reviewed that Carless has deteriorated from  a mental, physical and nutritional standpoint going back to July of this year when he endured a serious fall resulting in a traumatic head injury.  Prior to  this it was identified that he likely had vascular dementia.  Patient's family shared before this going back a year or so, he was fairly well-functioning, though some degree of decline had occurred.  We did ask the profound the fact that patient's fall traumatic head injury had on his dementia.  They endorse since that occurred all of his symptoms have escalated and worsened.  Medications have been tried to calm him though not effective.  We reviewed that vascular dementia can be amplified by such an occurrence.  Patient's daughters vocalized that they do not want him to suffer and feel like he is not improving.  Patient's daughters also note the decline in recent interest in nutrition.  We discussed the options moving forward inclusive of comfort care and hospice. We talked about transition to comfort measures in house and what that would entail inclusive of medications to control pain, dyspnea, agitation, nausea, itching, and hiccups.  We discussed stopping all uneccessary measures such as cardiac monitoring, blood draws, needle sticks, and frequent vital signs.   We reviewed many of the medications utilized to enhance comfort have the negative effect of making patient's more somnolent.  Patient's daughters both understand and are accepting of this.  They both emphasized that they do not want him to suffer anymore than he already has.  We discussed options moving forward once symptoms are under good control inclusive of transition back to [place] with hospice or transition to an inpatient hospice unit.  Patient's daughters at this time are of the mindset that they would like him to transition to [facility] if excepted.  We discussed that a liaison will do an evaluation and further information will be obtained from there.  Confirm the goals at this time will be towards comfort care and alleviating any distressing symptoms.  Utilized reflective listening throughout our time together.   Discussed  the importance of continued conversation with family and their  medical providers regarding overall plan of care and treatment options, ensuring decisions are within the context of the patients values and GOCs.  Advanced Care Planning Documentation:  Pertinent diagnosis: Multifocal pneumonia unlikely in the setting of aspiration  The patient and/or family consented to a voluntary Advance Care Planning Conversation in person/over the phone with patient's daughter M[place], Jon and other daughter, [name].   Summary of the conversation: Marinell was held regarding patient's acute on chronic decline over the past year.  Reviewed since July patient has deteriorated rather rapidly secondary to a traumatic head injury.  Patient has had difficult to control behaviors and has lost interest in eating and drinking.  Outcome of the conversations and/or documents completed: The outcome of the conversation is that patient's family would like to pursue comfort care and transition to [facility].  I spent 32 minutes providing separately identifiable ACP services with the patient and/or surrogate decision maker in a voluntary, in-person conversation discussing the patient's wishes and goals as detailed in the above note.  Decision Maker: Jon Morin ([phone]  SUMMARY OF RECOMMENDATIONS   DNAR/DNI  Comfort care  Initiation of diazepam  5 mg every 6 hours around-the-clock with PRNs as needed  Additional comfort medications per [facility]  Allow unrestricted visitation of friends and family  Appreciate [facility] liaison evaluating Dwyane for appropriateness of inpatient hospice  Ongoing palliative care support  Code  Status/Advance Care Planning: DNAR/DNI  Palliative Prophylaxis:  Aspiration, Bowel Regimen, Delirium Protocol, Frequent Pain Assessment, Oral Care, Palliative Wound Care, and Turn Reposition  Additional Recommendations (Limitations, Scope, Preferences): Comfort focused  care  Psycho-social/Spiritual:  Desire for further Chaplaincy support: Yes patient is [facility] Additional Recommendations: Education on transition to end-of-life   Prognosis: Limited 2 weeks  Discharge Planning: Discharge plan to be determined  Vitals:   10/12/23 2200 10/13/23 0251  BP:  (!) 164/71  Pulse:  74  Resp:  17  Temp:  98.1 F (36.7 C)  SpO2: 95% 91%    Intake/Output Summary (Last 24 hours) at 10/13/2023 0736 Last data filed at 10/12/2023 2124 Gross per 24 hour  Intake 1380.17 ml  Output 1550 ml  Net -169.83 ml   Last Weight  Most recent update: 10/13/2023  5:44 AM    Weight  115.4 kg (254 lb 6.6 oz)            LABS: CBC:    Component Value Date/Time   WBC 13.2 (H) 10/13/2023 0138   HGB 13.1 10/13/2023 0138   HGB 13.0 10/10/2023 1634   HCT 42.6 10/13/2023 0138   HCT 40.5 10/10/2023 1634   PLT 289 10/13/2023 0138   PLT 320 10/10/2023 1634   MCV 91.8 10/13/2023 0138   MCV 92 10/10/2023 1634   NEUTROABS 8.2 (H) 10/13/2023 0138   NEUTROABS 5.6 10/10/2023 1634   LYMPHSABS 3.7 10/13/2023 0138   LYMPHSABS 4.7 (H) 10/10/2023 1634   MONOABS 0.9 10/13/2023 0138   EOSABS 0.3 10/13/2023 0138   EOSABS 0.8 (H) 10/10/2023 1634   BASOSABS 0.1 10/13/2023 0138   BASOSABS 0.1 10/10/2023 1634   Comprehensive Metabolic Panel:    Component Value Date/Time   NA 141 10/13/2023 0138   NA 144 10/10/2023 1634   K 4.0 10/13/2023 0138   CL 111 10/13/2023 0138   CO2 23 10/13/2023 0138   BUN 28 (H) 10/13/2023 0138   BUN 42 (H) 10/10/2023 1634   CREATININE 2.10 (H) 10/13/2023 0138   CREATININE 1.09 06/08/2015 0907   GLUCOSE 116 (H) 10/13/2023 0138   CALCIUM 12.1 (H) 10/13/2023 0138   AST 46 (H) 10/13/2023 0138   ALT 19 10/13/2023 0138   ALKPHOS 80 10/13/2023 0138   BILITOT 0.5 10/13/2023 0138   BILITOT 0.3 10/10/2023 1634   PROT 6.7 10/13/2023 0138   PROT 7.1 10/10/2023 1634   ALBUMIN  2.6 (L) 10/13/2023 0138   ALBUMIN  3.5 (L) 10/10/2023 1634   Gen: Elderly  chronically ill-appearing Caucasian male HEENT: Dry mucous membranes CV: Regular rate and rhythm PULM: On 4 L nasal cannula breathing is even and unlabored ABD: soft/nontender EXT: No edema Neuro: Disoriented not speaking  PPS: 10%   This conversation/these recommendations were discussed with patient primary care team, Dr. Sigdel ______________________________________________________ Rosaline Becton [place] Palliative Medicine Team Team Cell Phone: [phone] Please utilize secure chat with additional questions, if there is no response within 30 minutes please call the above phone number  Billing based on MDM: High  Problems Addressed: One acute or chronic illness or injury that poses a threat to life or bodily function  Amount and/or Complexity of Data: Category 1:Review of prior external note(s) from each unique source, Review of the result(s) of each unique test, and Assessment requiring an independent historian(s)  Risks: Parenteral controlled substances and Decision not to resuscitate or to de-escalate care because of poor prognosis  Palliative Medicine Team providers are available by phone from 7am to 7pm daily  and can be reached through the team cell phone.  Should this patient require assistance outside of these hours, please call the patient's attending physician.

## 2023-10-13 NOTE — Progress Notes (Signed)
 [name] 2W11 [facility] Liaison Note   Received request for family interest in hospice inpatient unit. Visited patient and spoke with family at bedside to discuss services and hospice philosophy of care.    Chart reviewed and at this time patient does not meet criteria for hospice inpatient unit. We will reassess on tomorrow.     Thank you for the opportunity to participate in this patient's care.    Nat Babe, BSN, [facility] Liaison [phone]

## 2023-10-14 ENCOUNTER — Other Ambulatory Visit (HOSPITAL_COMMUNITY): Payer: Self-pay

## 2023-10-14 LAB — CALCIUM, IONIZED: Calcium, Ionized, Serum: 7.5 mg/dL — ABNORMAL HIGH (ref 4.5–5.6)

## 2023-10-14 LAB — PARATHYROID HORMONE, INTACT (NO CA): PTH: 100 pg/mL — ABNORMAL HIGH (ref 15–65)

## 2023-10-14 MED ORDER — GLYCOPYRROLATE 0.2 MG/ML IJ SOLN: 0.2000 mg | INTRAMUSCULAR | Status: DC | PRN

## 2023-10-14 MED ORDER — POLYVINYL ALCOHOL 1.4 % OP SOLN: 1.0000 [drp] | Freq: Four times a day (QID) | OPHTHALMIC | Status: DC | PRN

## 2023-10-14 MED ORDER — DIAZEPAM 5 MG/ML IJ SOLN: 2.5000 mg | INTRAMUSCULAR | Status: DC | PRN

## 2023-10-14 MED ORDER — HALOPERIDOL LACTATE 5 MG/ML IJ SOLN: 5.0000 mg | Freq: Four times a day (QID) | INTRAMUSCULAR | Status: DC | PRN

## 2023-10-14 MED ORDER — DIAZEPAM 5 MG/ML IJ SOLN: 5.0000 mg | Freq: Four times a day (QID) | INTRAMUSCULAR | Status: DC

## 2023-10-14 MED ORDER — HYDROMORPHONE HCL 1 MG/ML IJ SOLN: 0.5000 mg | INTRAMUSCULAR | Status: DC | PRN

## 2023-10-14 MED ORDER — ACETAMINOPHEN 325 MG PO TABS: 650.0000 mg | ORAL_TABLET | Freq: Four times a day (QID) | ORAL | Status: DC | PRN

## 2023-10-14 MED ORDER — ONDANSETRON HCL 4 MG/2ML IJ SOLN
4.0000 mg | Freq: Four times a day (QID) | INTRAMUSCULAR | 0 refills | Status: DC | PRN
  Filled 2023-10-14: qty 2, 1d supply, fill #0

## 2023-10-14 MED ORDER — ACETAMINOPHEN 650 MG RE SUPP: 650.0000 mg | Freq: Four times a day (QID) | RECTAL | Status: DC | PRN

## 2023-10-14 MED ORDER — GLYCOPYRROLATE 1 MG PO TABS: 1.0000 mg | ORAL_TABLET | ORAL | Status: DC | PRN

## 2023-10-14 MED ORDER — BIOTENE DRY MOUTH MT LIQD: 15.0000 mL | OROMUCOSAL | Status: DC | PRN

## 2023-10-14 MED ORDER — ONDANSETRON 4 MG PO TBDP: 4.0000 mg | ORAL_TABLET | Freq: Four times a day (QID) | ORAL | Status: DC | PRN

## 2023-10-14 NOTE — Progress Notes (Addendum)
 Palliative Medicine Inpatient Follow Up Note HPI: [name] is a 83 y.o. male with past medical history  of  CAD, HFpEF, COPD on 4 L O2, OSA on CPAP, ulcerative colitis, ankylosing spondylitis, hypertension, hyperlipidemia, CKD 3A, primary hyperparathyroidism, morbid obesity, mood disorder, dementia, coming for abnormal labs and hypercalcemia of 13.6.   The Palliative care team has been asked to support additional goals of care conversations.   Today's Discussion 10/14/2023  *Please note that this is a verbal dictation therefore any spelling or grammatical errors are due to the Dragon Medical One system interpretation.  Chart reviewed inclusive of vital signs, progress notes, laboratory results, and diagnostic images. [name]  IV dosing has been reviewed and remains appropriate to control agitation symptoms.  A meeting was held in the presence of patients daughters, [name], [name], and good friend. [facility] RN, [name] and myself were present.  A review of patients present clinical wellness was completed. Discussions related to complex symptom management with controlled substances were held. We discussed patient is appropriate for inpatient hospice. [name] was able to review further what that level of care would look like and the support which would be offered.   Created space and opportunity for patients daughter to explore thoughts feelings and fears regarding current medical situation. Patients daughter, Underwood expresses awareness and understanding of her present health. We discussed the overall trajectory and how patients time if limited. Allowed time for patients daughters to express their grief.   Confirmed the plan for [facility] once a bed becomes available.   Questions and concerns addressed/Palliative Support Provided.   Objective Assessment: Vital Signs Vitals:   10/14/23 0155 10/14/23 1008  BP:  (!) 127/51  Pulse:  92  Resp: (!) 21 (!) 22  Temp:  (!) 97.5 F  (36.4 C)  SpO2:  (!) 82%    Intake/Output Summary (Last 24 hours) at 10/14/2023 1154 Last data filed at 10/14/2023 0600 Gross per 24 hour  Intake 0 ml  Output 350 ml  Net -350 ml   Last Weight  Most recent update: 10/13/2023  5:44 AM    Weight  115.4 kg (254 lb 6.6 oz)            Gen: Elderly chronically ill-appearing Caucasian male HEENT: Dry mucous membranes CV: Regular rate and rhythm PULM: On 4L nasal cannula breathing is even and unlabored ABD: soft/nontender EXT: No edema Neuro: Disoriented not speaking  SUMMARY OF RECOMMENDATIONS   DNAR/DNI  GOLD DNR on chart   Comfort care   Initiation of [name]  5 mg every 6 hours around-the-clock with PRNs as needed   Additional comfort medications per [facility]   Allow unrestricted visitation of friends and family   [facility] once bed becomes available   Ongoing palliative care support ______________________________________________________________________________________ [name] [place] Palliative Medicine Team Team Cell Phone: [phone] Please utilize secure chat with additional questions, if there is no response within 30 minutes please call the above phone number  Billing based on MDM: High Problems Addressed: One acute or chronic illness or injury that poses a threat to life or bodily function Amount and/or Complexity of Data: Category 1:Review of prior external note(s) from each unique source, Review of the result(s) of each unique test, and Assessment requiring an independent historian(s) and Category 3:Discussion of management or test interpretation with external physician/other qualified health care professional/appropriate source (not separately reported) Risks: Parenteral controlled substances and Decision not to resuscitate or to de-escalate care because  of poor prognosis  Palliative Medicine Team providers are available by phone from 7am to 7pm daily and can be reached  through the team cell phone.  Should this patient require assistance outside of these hours, please call the patient's attending physician.

## 2023-10-14 NOTE — TOC Transition Note (Signed)
 Transition of Care [facility]) - Discharge Note   Patient Details  Name: [name] MRN: [id number] Date of Birth: [birth year]  Transition of Care [facility]) CM/SW Contact:  Ysabel Stankovich A [place], LCSW Phone Number: 10/14/2023, 2:13 PM   Clinical Narrative:      Patient will DC to: [facility]  Anticipated DC date: 10/14/23  Family notified: [name], pt's daughter  Transport by: [place]     Per MD patient ready for DC to [facility]. RN, patient, patient's family, and facility notified of DC. Discharge Summary and FL2 sent to facility. RN to call report prior to discharge [phone]). DC packet on chart. Ambulance transport requested for patient.     CSW will sign off for now as social work intervention is no longer needed. Please consult us  again if new needs arise.   Final next level of care: Hospice Medical Facility Barriers to Discharge: Barriers Resolved   Patient Goals and CMS Choice Patient states their goals for this hospitalization and ongoing recovery are:: Hospice CMS Medicare.gov Compare Post Acute Care list provided to:: Patient Represenative (must comment) [place]ley, pt's daughter) Choice offered to / list presented to : Adult Children      Discharge Placement              Patient chooses bed at:  [facility]) Patient to be transferred to facility by: [facility] Name of family member notified: [name], daughter Patient and family notified of of transfer: 10/14/23  Discharge Plan and Services Additional resources added to the After Visit Summary for                                       Social Drivers of Health (SDOH) Interventions SDOH Screenings   Food Insecurity: No Food Insecurity (10/11/2023)  Housing: Low Risk  (10/11/2023)  Transportation Needs: Patient Unable To Answer (10/11/2023)  Utilities: Patient Unable To Answer (10/11/2023)  Alcohol  Screen: Low Risk  (07/12/2023)  Depression (PHQ2-9): High Risk (07/12/2023)  Financial Resource Strain: Low Risk   (07/12/2023)  Physical Activity: Inactive (07/12/2023)  Social Connections: Moderately Isolated (10/11/2023)  Stress: No Stress Concern Present (07/12/2023)  Tobacco Use: Medium Risk (10/11/2023)  Health Literacy: Adequate Health Literacy (07/12/2023)     Readmission Risk Interventions    12/04/2022   12:13 PM  Readmission Risk Prevention Plan  Post Dischage Appt Complete  Medication Screening Complete  Transportation Screening Complete

## 2023-10-14 NOTE — Progress Notes (Signed)
 [facility] Liaison Note  Received request from Transitions of Care Manager for family interest in [facility].  Eligibility has been confirmed.  Met with patient/family to confirm interest and explained services.  Family agreeable to transfer today.  Transitions of Care Manager aware.  RN please call report to [phone] prior to patient leaving the unit. Please send a signed and completed DNR with patient at discharge.  Thank you Inocente Jacobs, BSN, [facility] Liaision [phone]

## 2023-10-14 NOTE — Discharge Summary (Signed)
 Physician Discharge Summary  [name] FMW:[id number] DOB: May 09, 1940 DOA: 10/11/2023  PCP: Alphonsa Glendia LABOR, MD  Admit date: 10/11/2023 Discharge date: 10/14/2023  Admitted From: SNF Disposition: Hospice home  Recommendations for Outpatient Follow-up:  Hospice care  Home Health: No Equipment/Devices: None  Discharge Condition: poor CODE STATUS: DNR/DNI Diet recommendation:   Brief/Interim Summary:  [name] is a 83 y.o. male with past medical history  of  CAD, HFpEF, COPD on 4 L O2, OSA on CPAP, ulcerative colitis, ankylosing spondylitis, hypertension, hyperlipidemia, CKD 3A, primary hyperparathyroidism, morbid obesity, mood disorder, dementia, coming for abnormal labs and hypercalcemia of 13.6, also concerns for worsening dementia.  Due to declining dementia over the past several months with behavioral issues and with multiple comorbidities, family decided for comfort care/hospice.  Patient will be transferred to [facility].     >> Hypercalcemia secondary to primary hyperparathyroidism: Calcium 13.6 on presentation, with a history of primary hyperparathyroidism in the past.  Received IV fluids, calcitonin, IV Lasix .  Ultimately comfort care.   >> Essential hypertension: Meds held.   CKD: Creatinine stable 2.34   >>Dementia/ Toxic metabolic encephalopathy    Sounds like patient has declined since July of this year.  He is having hallucinations, agitation.  He may not remember family members.  Palliative care/hospice consulted.  Family have decided for hospice/comfort care.  Patient will be discharged once bed available at [facility].   Disposition Plan: Hospice.   Discharge Diagnoses:  Principal Problem:   Parathyroid  related hypercalcemia ([name]) Active Problems:   Essential hypertension   Chronic heart failure with preserved ejection fraction (HFpEF) ([name]) - No ARB/ACEI due to CKD stage 3b    Discharge Instructions   Allergies as of 10/14/2023   No  Known Allergies      Medication List     STOP taking these medications    ammonium lactate  12 % lotion Commonly known as: Amlactin Daily   Anusol -HC 2.5 % rectal cream Generic drug: hydrocortisone    aspirin  EC 81 MG tablet   bisacodyl 10 MG suppository Commonly known as: DULCOLAX   Calmoseptine 0.44-20.6 % Oint Generic drug: Menthol-Zinc  Oxide   carvedilol  6.25 MG tablet Commonly known as: COREG    DULoxetine  60 MG capsule Commonly known as: CYMBALTA    feeding supplement (PRO-STAT 64) Liqd   hydrOXYzine 25 MG tablet Commonly known as: ATARAX   isosorbide  mononitrate 60 MG 24 hr tablet Commonly known as: IMDUR    lidocaine  5 % Commonly known as: LIDODERM    Milk of Magnesia 1200 MG/15ML suspension Generic drug: magnesium  hydroxide   MULTIVITAMIN PO   nitroGLYCERIN  0.4 MG SL tablet Commonly known as: NITROSTAT    OXYGEN    SODIUM PHOSPHATES RE   tamsulosin  0.4 MG Caps capsule Commonly known as: FLOMAX    Vitamin D  (Ergocalciferol ) 1.25 MG (50000 UNIT) Caps capsule Commonly known as: DRISDOL        TAKE these medications    acetaminophen  325 MG tablet Commonly known as: TYLENOL  Take 2 tablets (650 mg total) by mouth every 6 (six) hours as needed for mild pain (pain score 1-3), moderate pain (pain score 4-6), fever or headache (or Fever >/= 101).   acetaminophen  650 MG suppository Commonly known as: TYLENOL  Place 1 suppository (650 mg total) rectally every 6 (six) hours as needed for mild pain (pain score 1-3), moderate pain (pain score 4-6) or fever (or Fever >/= 101).   antiseptic oral rinse Liqd Apply 15 mLs topically as needed for dry mouth.   artificial tears  ophthalmic solution Place 1 drop into both eyes 4 (four) times daily as needed for dry eyes.   diazepam  5 MG/ML injection Commonly known as: VALIUM  Inject 0.5 mLs (2.5 mg total) into the vein every 4 (four) hours as needed.   diazepam  5 MG/ML injection Commonly known as: VALIUM  Inject  1 mL (5 mg total) into the vein every 6 (six) hours.   glycopyrrolate  1 MG tablet Commonly known as: ROBINUL  Take 1 tablet (1 mg total) by mouth every 4 (four) hours as needed (excessive secretions).   glycopyrrolate  0.2 MG/ML injection Commonly known as: ROBINUL  Inject 1 mL (0.2 mg total) into the skin every 4 (four) hours as needed (excessive secretions).   glycopyrrolate  0.2 MG/ML injection Commonly known as: ROBINUL  Inject 1 mL (0.2 mg total) into the vein every 4 (four) hours as needed (excessive secretions).   haloperidol  lactate 5 MG/ML injection Commonly known as: HALDOL  Inject 1 mL (5 mg total) into the vein every 6 (six) hours as needed.   HYDROmorphone  1 MG/ML injection Commonly known as: DILAUDID  Inject 0.5-1 mLs (0.5-1 mg total) into the vein every hour as needed (Pain/Dyspnea).   ondansetron  4 MG disintegrating tablet Commonly known as: ZOFRAN -ODT Take 1 tablet (4 mg total) by mouth every 6 (six) hours as needed for nausea.   ondansetron  4 MG/2ML Soln injection Commonly known as: ZOFRAN  Inject 2 mLs (4 mg total) into the vein every 6 (six) hours as needed for nausea.        No Known Allergies  Consultations:    Procedures/Studies: CT CHEST ABDOMEN PELVIS WO CONTRAST Result Date: 10/13/2023 EXAM: CT CHEST, ABDOMEN AND PELVIS WITHOUT CONTRAST 10/13/2023 12:48:20 AM TECHNIQUE: CT of the chest, abdomen and pelvis was performed without the administration of intravenous contrast. Multiplanar reformatted images are provided for review. Automated exposure control, iterative reconstruction, and/or weight based adjustment of the mA/kV was utilized to reduce the radiation dose to as low as reasonably achievable. COMPARISON: X-ray dated 06/25/2023 to 06/30/2023; CT cervical spine 03/13/2023; CT chest January 16, 2022. CLINICAL HISTORY: Occult malignancy. FINDINGS: CHEST: MEDIASTINUM AND LYMPH NODES: Coronary artery and aortic atherosclerotic calcifications. No pericardial  effusion. LUNGS AND PLEURA: Emphysema with bullous change in the right apex. Diffuse bronchial wall thickening greatest in the lower lobes. Infiltrates in the posterior left upper lobe and bilateral lower lobes. Small bilateral pleural effusions. Findings may be due to multifocal pneumonia and/or aspiration. No pneumothorax. ABDOMEN AND PELVIS: Evaluation for occult malignancy is limited without IV contrast. LIVER: The liver is unremarkable. GALLBLADDER AND BILE DUCTS: Cholelithiasis without evidence of acute cholecystitis. No biliary dilation. SPLEEN: No acute abnormality. PANCREAS: Peripancreatic inflammatory stranding and small amount of fluid. No organized fluid collection. No ductal dilation. ADRENAL GLANDS: No acute abnormality. KIDNEYS, URETERS AND BLADDER: Nonobstructing bilateral nephrolithiasis greater on the left. No hydronephrosis. Foley catheter and gas in the nondistended bladder. GI AND BOWEL: Stomach demonstrates no acute abnormality. There is no bowel obstruction. REPRODUCTIVE ORGANS: No acute abnormality. PERITONEUM AND RETROPERITONEUM: No ascites. No free air. VASCULATURE: Aortic atherosclerotic calcification. 3.1 cm infrarenal abdominal aortic aneurysm. ABDOMINAL AND PELVIS LYMPH NODES: No lymphadenopathy. REPRODUCTIVE ORGANS: No acute abnormality. BONES AND SOFT TISSUES: Complete fusion of the anterior osteophytes throughout the thoracolumbar spine. No evidence of acute fracture. IMPRESSION: 1. Multifocal pneumonia and/or aspiration in the lungs. 2. Acute pancreatitis. No organized fluid collection. No ductal dilation. 3. 3.1 cm infrarenal abdominal aortic aneurysm. Ultrasound Surveillance with a 3-year interval is recommended. 4. Cholelithiasis without evidence of acute cholecystitis  or biliary dilation. 5. Nonobstructing bilateral nephrolithiasis greater on the left. No hydronephrosis. Electronically signed by: Norman Gatlin MD 10/13/2023 01:00 AM EDT RP Workstation: [id number]   DG Chest  Portable 1 View Result Date: 10/11/2023 CLINICAL DATA:  Hyperglycemia. EXAM: PORTABLE CHEST 1 VIEW COMPARISON:  August 14, 2023 FINDINGS: The cardiac silhouette is enlarged and unchanged in size. Marked severity calcification of the aortic arch is seen. Low lung volumes are noted. There is mild prominence of the pulmonary vasculature. Very mild atelectatic changes are suspected within the right infrahilar region and left lung base. No pleural effusion or pneumothorax is identified. Multilevel degenerative changes are seen throughout the thoracic spine. IMPRESSION: 1. Stable cardiomegaly with mild pulmonary vascular congestion. 2. Very mild right infrahilar and left basilar atelectasis. Electronically Signed   By: Suzen Dials M.D.   On: 10/11/2023 11:47      Subjective:   Discharge Exam: Vitals:   10/14/23 0155 10/14/23 1008  BP:  (!) 127/51  Pulse:  92  Resp: (!) 21 (!) 22  Temp:  (!) 97.5 F (36.4 C)  SpO2:  (!) 82%    General: Pt is alert, awake, not in acute distress Cardiovascular: rate controlled, S1/S2 + Respiratory: bilateral decreased breath sounds at bases Abdominal: Soft, NT, ND, bowel sounds + Extremities: no edema, no cyanosis    The results of significant diagnostics from this hospitalization (including imaging, microbiology, ancillary and laboratory) are listed below for reference.     Microbiology: No results found for this or any previous visit (from the past 240 hours).   Labs: BNP (last 3 results) Recent Labs    12/03/22 1042 12/06/22 0828 08/14/23 1641  BNP 125.7* 89.1 171.5*   Basic Metabolic Panel: Recent Labs  Lab 10/10/23 1634 10/11/23 1036 10/12/23 0153 10/13/23 0138  NA 144 138 139 141  K 5.3* 4.3 3.9 4.0  CL 106 104 108 111  CO2 24 24 24 23   GLUCOSE 112* 115* 132* 116*  BUN 42* 37* 34* 28*  CREATININE 2.44* 2.33* 2.15* 2.10*  CALCIUM 13.4* 13.6* 12.7* 12.1*  MG  --  2.1 1.9  --   PHOS  --  3.0 2.0*  --    Liver Function  Tests: Recent Labs  Lab 10/10/23 1634 10/11/23 1036 10/12/23 0153 10/13/23 0138  AST 21 23 23  46*  ALT 16 18 18 19   ALKPHOS 126* 89 86 80  BILITOT 0.3 0.4 0.6 0.5  PROT 7.1 6.8 6.7 6.7  ALBUMIN  3.5* 2.7* 2.6* 2.6*   Recent Labs  Lab 10/11/23 1036  LIPASE 36   No results for input(s): AMMONIA in the last 168 hours. CBC: Recent Labs  Lab 10/10/23 1634 10/11/23 1036 10/12/23 0153 10/13/23 0138  WBC 12.4* 9.7 10.2 13.2*  NEUTROABS 5.6 4.6  --  8.2*  HGB 13.0 12.4* 12.4* 13.1  HCT 40.5 40.7 39.3 42.6  MCV 92 94.9 91.2 91.8  PLT 320 287 261 289   Cardiac Enzymes: No results for input(s): CKTOTAL, CKMB, CKMBINDEX, TROPONINI in the last 168 hours. BNP: Invalid input(s): POCBNP CBG: No results for input(s): GLUCAP in the last 168 hours. D-Dimer No results for input(s): DDIMER in the last 72 hours. Hgb A1c No results for input(s): HGBA1C in the last 72 hours. Lipid Profile No results for input(s): CHOL, HDL, LDLCALC, TRIG, CHOLHDL, LDLDIRECT in the last 72 hours. Thyroid  function studies No results for input(s): TSH, T4TOTAL, T3FREE, THYROIDAB in the last 72 hours.  Invalid input(s): FREET3 Anemia work up No  results for input(s): VITAMINB12, FOLATE, FERRITIN, TIBC, IRON, RETICCTPCT in the last 72 hours. Urinalysis    Component Value Date/Time   COLORURINE YELLOW 10/11/2023 1352   APPEARANCEUR CLEAR 10/11/2023 1352   LABSPEC 1.012 10/11/2023 1352   PHURINE 5.0 10/11/2023 1352   GLUCOSEU NEGATIVE 10/11/2023 1352   HGBUR NEGATIVE 10/11/2023 1352   BILIRUBINUR NEGATIVE 10/11/2023 1352   BILIRUBINUR ++ 11/18/2012 1319   KETONESUR NEGATIVE 10/11/2023 1352   PROTEINUR 30 (A) 10/11/2023 1352   UROBILINOGEN 0.2 08/04/2014 1620   NITRITE NEGATIVE 10/11/2023 1352   LEUKOCYTESUR TRACE (A) 10/11/2023 1352   Sepsis Labs Recent Labs  Lab 10/10/23 1634 10/11/23 1036 10/12/23 0153 10/13/23 0138  WBC 12.4* 9.7 10.2  13.2*   Microbiology No results found for this or any previous visit (from the past 240 hours).   Time coordinating discharge: 35 minutes  SIGNED:   Kemper Hochman, MD  [facility] 10/14/2023, 2:10 PM

## 2023-10-14 NOTE — Plan of Care (Signed)
  Problem: Education: Goal: Knowledge of General Education information will improve Description: Including pain rating scale, medication(s)/side effects and non-pharmacologic comfort measures Outcome: Progressing   Problem: Health Behavior/Discharge Planning: Goal: Ability to manage health-related needs will improve Outcome: Progressing   Problem: Clinical Measurements: Goal: Ability to maintain clinical measurements within normal limits will improve Outcome: Progressing Goal: Will remain free from infection Outcome: Progressing Goal: Diagnostic test results will improve Outcome: Progressing Goal: Respiratory complications will improve Outcome: Progressing Goal: Cardiovascular complication will be avoided Outcome: Progressing   Problem: Activity: Goal: Risk for activity intolerance will decrease Outcome: Progressing   Problem: Nutrition: Goal: Adequate nutrition will be maintained Outcome: Progressing   Problem: Coping: Goal: Level of anxiety will decrease Outcome: Progressing   Problem: Elimination: Goal: Will not experience complications related to bowel motility Outcome: Progressing Goal: Will not experience complications related to urinary retention Outcome: Progressing   Problem: Pain Managment: Goal: General experience of comfort will improve and/or be controlled Outcome: Progressing   Problem: Safety: Goal: Ability to remain free from injury will improve Outcome: Progressing   Problem: Skin Integrity: Goal: Risk for impaired skin integrity will decrease Outcome: Progressing   Problem: Safety: Goal: Non-violent Restraint(s) Outcome: Progressing   Problem: Education: Goal: Knowledge of the prescribed therapeutic regimen will improve Outcome: Progressing   Problem: Coping: Goal: Ability to identify and develop effective coping behavior will improve Outcome: Progressing   Problem: Clinical Measurements: Goal: Quality of life will improve Outcome:  Progressing   Problem: Respiratory: Goal: Verbalizations of increased ease of respirations will increase Outcome: Progressing   Problem: Role Relationship: Goal: Family's ability to cope with current situation will improve Outcome: Progressing Goal: Ability to verbalize concerns, feelings, and thoughts to partner or family member will improve Outcome: Progressing   Problem: Pain Management: Goal: Satisfaction with pain management regimen will improve Outcome: Progressing

## 2023-10-14 NOTE — Progress Notes (Signed)
 PROGRESS NOTE    [name]  FMW:[id number] DOB: Apr 14, 1940 DOA: 10/11/2023 PCP: Alphonsa Glendia LABOR, MD   Brief Narrative:    Assessment & Plan:   Principal Problem:   Parathyroid  related hypercalcemia ([name]) Active Problems:   Essential hypertension   Chronic heart failure with preserved ejection fraction (HFpEF) ([name]) - No ARB/ACEI due to CKD stage 3b    [name] is a 83 y.o. male with past medical history  of  CAD, HFpEF, COPD on 4 L O2, OSA on CPAP, ulcerative colitis, ankylosing spondylitis, hypertension, hyperlipidemia, CKD 3A, primary hyperparathyroidism, morbid obesity, mood disorder, dementia, coming for abnormal labs and hypercalcemia of 13.6, also concerns for worsening dementia    >> Hypercalcemia secondary to primary hyperparathyroidism: Calcium 13.6 on presentation, with a history of primary hyperparathyroidism in the past.  Received IV fluids, calcitonin, IV Lasix .  Ultimately comfort care.  >> Essential hypertension: Meds held.  CKD: Creatinine stable 2.34  >>Dementia/ Toxic metabolic encephalopathy   Sounds like patient has declined since July of this year.  He is having hallucinations, agitation.  He may not remember family members.  Appreciate palliative input.  Family decided for comfort care/hospice.  Patient can be discharged once bed available at [facility].  Disposition Plan: Hospice. Status is: Inpatient Remains inpatient appropriate because:     Consultants:   Procedures:   Antimicrobials:    Subjective:  Patient seen and examined at bedside earlier today.  He was drowsy and barely arousable,   Vital signs are stable.  No family at the bedside.  Palliative care following and assisting with comfort care/transition to hospice.  Objective: Vitals:   10/14/23 0014 10/14/23 0124 10/14/23 0155 10/14/23 1008  BP:    (!) 127/51  Pulse:  93  92  Resp:  (!) 28 (!) 21 (!) 22  Temp: 99.4 F (37.4 C)   (!) 97.5 F (36.4 C)   TempSrc: Axillary     SpO2:  (!) 89%  (!) 82%  Weight:      Height:        Intake/Output Summary (Last 24 hours) at 10/14/2023 1340 Last data filed at 10/14/2023 0600 Gross per 24 hour  Intake 0 ml  Output 350 ml  Net -350 ml   Filed Weights   10/11/23 1400 10/12/23 1735 10/13/23 0500  Weight: 124.3 kg 115.4 kg 115.4 kg    Examination:  General exam: Appears calm and comfortable  Respiratory system: Bilateral decreased breath sounds at bases Cardiovascular system: S1 & S2 heard, Rate controlled Gastrointestinal system: Abdomen is nondistended, soft and nontender. Normal bowel sounds heard. Extremities: No cyanosis, clubbing, edema  Central nervous system: Alert and oriented. No focal neurological deficits. Moving extremities Skin: No rashes, lesions or ulcers Psychiatry: Judgement and insight appear normal. Mood & affect appropriate.     Data Reviewed: I have personally reviewed following labs and imaging studies  CBC: Recent Labs  Lab 10/10/23 1634 10/11/23 1036 10/12/23 0153 10/13/23 0138  WBC 12.4* 9.7 10.2 13.2*  NEUTROABS 5.6 4.6  --  8.2*  HGB 13.0 12.4* 12.4* 13.1  HCT 40.5 40.7 39.3 42.6  MCV 92 94.9 91.2 91.8  PLT 320 287 261 289   Basic Metabolic Panel: Recent Labs  Lab 10/10/23 1634 10/11/23 1036 10/12/23 0153 10/13/23 0138  NA 144 138 139 141  K 5.3* 4.3 3.9 4.0  CL 106 104 108 111  CO2 24 24 24 23   GLUCOSE 112* 115* 132* 116*  BUN 42* 37*  34* 28*  CREATININE 2.44* 2.33* 2.15* 2.10*  CALCIUM 13.4* 13.6* 12.7* 12.1*  MG  --  2.1 1.9  --   PHOS  --  3.0 2.0*  --    GFR: Estimated Creatinine Clearance: 34.2 mL/min (A) (by C-G formula based on SCr of 2.1 mg/dL (H)). Liver Function Tests: Recent Labs  Lab 10/10/23 1634 10/11/23 1036 10/12/23 0153 10/13/23 0138  AST 21 23 23  46*  ALT 16 18 18 19   ALKPHOS 126* 89 86 80  BILITOT 0.3 0.4 0.6 0.5  PROT 7.1 6.8 6.7 6.7  ALBUMIN  3.5* 2.7* 2.6* 2.6*   Recent Labs  Lab 10/11/23 1036   LIPASE 36   No results for input(s): AMMONIA in the last 168 hours. Coagulation Profile: No results for input(s): INR, PROTIME in the last 168 hours. Cardiac Enzymes: No results for input(s): CKTOTAL, CKMB, CKMBINDEX, TROPONINI in the last 168 hours. BNP (last 3 results) No results for input(s): PROBNP in the last 8760 hours. HbA1C: No results for input(s): HGBA1C in the last 72 hours. CBG: No results for input(s): GLUCAP in the last 168 hours. Lipid Profile: No results for input(s): CHOL, HDL, LDLCALC, TRIG, CHOLHDL, LDLDIRECT in the last 72 hours. Thyroid  Function Tests: No results for input(s): TSH, T4TOTAL, FREET4, T3FREE, THYROIDAB in the last 72 hours. Anemia Panel: No results for input(s): VITAMINB12, FOLATE, FERRITIN, TIBC, IRON, RETICCTPCT in the last 72 hours. Sepsis Labs: No results for input(s): PROCALCITON, LATICACIDVEN in the last 168 hours.  No results found for this or any previous visit (from the past 240 hours).       Radiology Studies: CT CHEST ABDOMEN PELVIS WO CONTRAST Result Date: 10/13/2023 EXAM: CT CHEST, ABDOMEN AND PELVIS WITHOUT CONTRAST 10/13/2023 12:48:20 AM TECHNIQUE: CT of the chest, abdomen and pelvis was performed without the administration of intravenous contrast. Multiplanar reformatted images are provided for review. Automated exposure control, iterative reconstruction, and/or weight based adjustment of the mA/kV was utilized to reduce the radiation dose to as low as reasonably achievable. COMPARISON: X-ray dated 06/25/2023 to 06/30/2023; CT cervical spine 03/13/2023; CT chest January 16, 2022. CLINICAL HISTORY: Occult malignancy. FINDINGS: CHEST: MEDIASTINUM AND LYMPH NODES: Coronary artery and aortic atherosclerotic calcifications. No pericardial effusion. LUNGS AND PLEURA: Emphysema with bullous change in the right apex. Diffuse bronchial wall thickening greatest in the lower lobes.  Infiltrates in the posterior left upper lobe and bilateral lower lobes. Small bilateral pleural effusions. Findings may be due to multifocal pneumonia and/or aspiration. No pneumothorax. ABDOMEN AND PELVIS: Evaluation for occult malignancy is limited without IV contrast. LIVER: The liver is unremarkable. GALLBLADDER AND BILE DUCTS: Cholelithiasis without evidence of acute cholecystitis. No biliary dilation. SPLEEN: No acute abnormality. PANCREAS: Peripancreatic inflammatory stranding and small amount of fluid. No organized fluid collection. No ductal dilation. ADRENAL GLANDS: No acute abnormality. KIDNEYS, URETERS AND BLADDER: Nonobstructing bilateral nephrolithiasis greater on the left. No hydronephrosis. Foley catheter and gas in the nondistended bladder. GI AND BOWEL: Stomach demonstrates no acute abnormality. There is no bowel obstruction. REPRODUCTIVE ORGANS: No acute abnormality. PERITONEUM AND RETROPERITONEUM: No ascites. No free air. VASCULATURE: Aortic atherosclerotic calcification. 3.1 cm infrarenal abdominal aortic aneurysm. ABDOMINAL AND PELVIS LYMPH NODES: No lymphadenopathy. REPRODUCTIVE ORGANS: No acute abnormality. BONES AND SOFT TISSUES: Complete fusion of the anterior osteophytes throughout the thoracolumbar spine. No evidence of acute fracture. IMPRESSION: 1. Multifocal pneumonia and/or aspiration in the lungs. 2. Acute pancreatitis. No organized fluid collection. No ductal dilation. 3. 3.1 cm infrarenal abdominal aortic aneurysm. Ultrasound Surveillance with  a 3-year interval is recommended. 4. Cholelithiasis without evidence of acute cholecystitis or biliary dilation. 5. Nonobstructing bilateral nephrolithiasis greater on the left. No hydronephrosis. Electronically signed by: Norman Gatlin MD 10/13/2023 01:00 AM EDT RP Workstation: [id number]        Scheduled Meds:  diazepam   5 mg Intravenous Q6H   Continuous Infusions:          Derryl Duval, MD [facility] 10/14/2023, 1:40 PM

## 2023-10-17 NOTE — Progress Notes (Signed)
 Non violent restraints ordered for patient due to patient pulling at IV line, and Foley line. Restraint is not used because patients behavior is cooperative and does not pull at lines at end of shift.

## 2023-10-19 LAB — PTH-RELATED PEPTIDE: PTH-related peptide: <2 pmol/L

## 2023-10-21 ENCOUNTER — Ambulatory Visit: Payer: Self-pay | Admitting: Family Medicine

## 2023-10-21 DIAGNOSIS — R0902 Hypoxemia: Secondary | ICD-10-CM | POA: Diagnosis not present

## 2023-10-21 DIAGNOSIS — J9601 Acute respiratory failure with hypoxia: Secondary | ICD-10-CM | POA: Diagnosis not present

## 2023-10-24 ENCOUNTER — Ambulatory Visit: Admitting: Podiatry

## 2023-11-06 ENCOUNTER — Ambulatory Visit: Admitting: Family Medicine

## 2023-11-06 DEATH — deceased

## 2024-07-17 ENCOUNTER — Ambulatory Visit
